# Patient Record
Sex: Male | Born: 1985 | Race: White | Hispanic: No | Marital: Single | Smoking: Never smoker
Health system: Southern US, Community
[De-identification: ages and names within clinical notes are randomized; demographics above are authoritative.]

## PROBLEM LIST (undated history)

## (undated) DIAGNOSIS — E119 Type 2 diabetes mellitus without complications: Secondary | ICD-10-CM

## (undated) DIAGNOSIS — K589 Irritable bowel syndrome without diarrhea: Secondary | ICD-10-CM

## (undated) DIAGNOSIS — I10 Essential (primary) hypertension: Secondary | ICD-10-CM

## (undated) DIAGNOSIS — M869 Osteomyelitis, unspecified: Secondary | ICD-10-CM

## (undated) DIAGNOSIS — K219 Gastro-esophageal reflux disease without esophagitis: Secondary | ICD-10-CM

## (undated) DIAGNOSIS — N189 Chronic kidney disease, unspecified: Secondary | ICD-10-CM

## (undated) HISTORY — DX: Type 2 diabetes mellitus without complications: E11.9

## (undated) HISTORY — DX: Gastro-esophageal reflux disease without esophagitis: K21.9

## (undated) HISTORY — DX: Irritable bowel syndrome, unspecified: K58.9

---
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
## 2007-01-09 ENCOUNTER — Ambulatory Visit: Payer: Self-pay

## 2007-03-20 ENCOUNTER — Emergency Department: Payer: Self-pay | Admitting: Emergency Medicine

## 2007-03-29 ENCOUNTER — Inpatient Hospital Stay (HOSPITAL_COMMUNITY): Admission: EM | Admit: 2007-03-29 | Discharge: 2007-03-31 | Payer: Self-pay | Admitting: Emergency Medicine

## 2007-04-06 LAB — CBC AND DIFFERENTIAL
HCT: 45 % (ref 41–53)
Hemoglobin: 15.9 g/dL (ref 13.5–17.5)
Neutrophils Absolute: 4 /uL
Platelets: 223 10*3/uL (ref 150–399)
WBC: 7.2 10*3/mL

## 2007-10-18 ENCOUNTER — Emergency Department: Payer: Self-pay | Admitting: Emergency Medicine

## 2007-11-07 ENCOUNTER — Inpatient Hospital Stay: Payer: Self-pay | Admitting: Internal Medicine

## 2007-11-24 ENCOUNTER — Inpatient Hospital Stay: Payer: Self-pay | Admitting: Internal Medicine

## 2008-02-07 LAB — BASIC METABOLIC PANEL
BUN: 14 mg/dL (ref 4–21)
CREATININE: 1.1 mg/dL (ref 0.6–1.3)
Glucose: 345 mg/dL
Potassium: 4.3 mmol/L (ref 3.4–5.3)
Sodium: 136 mmol/L — AB (ref 137–147)

## 2008-02-07 LAB — HEPATIC FUNCTION PANEL
ALK PHOS: 117 U/L (ref 25–125)
ALT: 23 U/L (ref 10–40)
AST: 18 U/L (ref 14–40)
Bilirubin, Total: 0.4 mg/dL

## 2008-02-07 LAB — LIPID PANEL
Cholesterol: 243 mg/dL — AB (ref 0–200)
HDL: 44 mg/dL (ref 35–70)
LDL Cholesterol: 147 mg/dL
Triglycerides: 259 mg/dL — AB (ref 40–160)

## 2008-04-09 ENCOUNTER — Observation Stay: Payer: Self-pay | Admitting: Specialist

## 2008-08-24 ENCOUNTER — Emergency Department: Payer: Self-pay | Admitting: Emergency Medicine

## 2008-09-13 ENCOUNTER — Emergency Department: Payer: Self-pay | Admitting: Emergency Medicine

## 2008-10-29 ENCOUNTER — Emergency Department: Payer: Self-pay | Admitting: Emergency Medicine

## 2009-10-30 ENCOUNTER — Emergency Department: Payer: Self-pay | Admitting: Emergency Medicine

## 2009-11-16 ENCOUNTER — Inpatient Hospital Stay: Payer: Self-pay | Admitting: *Deleted

## 2009-11-25 DIAGNOSIS — F329 Major depressive disorder, single episode, unspecified: Secondary | ICD-10-CM | POA: Insufficient documentation

## 2009-12-30 DIAGNOSIS — K219 Gastro-esophageal reflux disease without esophagitis: Secondary | ICD-10-CM | POA: Insufficient documentation

## 2010-03-26 ENCOUNTER — Emergency Department: Payer: Self-pay | Admitting: Emergency Medicine

## 2010-08-07 ENCOUNTER — Emergency Department: Payer: Self-pay | Admitting: Emergency Medicine

## 2010-11-17 NOTE — Discharge Summary (Signed)
NAMEJOSHAWN, SETIAWAN NO.:  [id number]   MEDICAL RECORD NO.:  [id number]          PATIENT TYPE:  INP   LOCATION:  4712                         FACILITY:  [facility]   PHYSICIAN:  Rexene Alberts, M.D.    DATE OF BIRTH:  02-07-1986   DATE OF ADMISSION:  03/29/2007  DATE OF DISCHARGE:  03/31/2007                               DISCHARGE SUMMARY   DISCHARGE DIAGNOSES:  1. Epigastric abdominal pain thought to be secondary to either peptic      ulcer disease or gastritis.  2. Nausea, vomiting with a small amount of hematemesis by history.  3. Uncontrolled type 2 diabetes mellitus.  4. Hypokalemia.  5. Tobacco abuse.   DISCHARGE MEDICATIONS:  1. Ranitidine 150 one pill b.i.d.  2. Vicodin 5 mg every four hours as needed for pain.  3. Potassium chloride 20 mEq daily for one week.  4. Lantus insulin 40 units subcu q.h.s.  5. 70/30 insulin 35 units b.i.d.   DISCHARGE DISPOSITION:  The patient was discharged to home in improved  and stable condition on March 31, 2007.  He was advised to follow up  with his primary care physician at the [facility] in one week.   PROCEDURES PERFORMED:  CT scan of the abdomen and pelvis with contrast  on March 30, 2007.  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  CT scan of the pelvis revealed no  acute pelvic findings.   HISTORY OF PRESENT ILLNESS:  The patient is a 25 year old man with a  past medical history significant for type 2 diabetes mellitus, chronic  low back pain, and migraine headaches.  He presented to the emergency  department on March 29, 2007 with a chief complaint of abdominal  pain, nausea, and vomiting.  On one occasion he witnessed a small amount  of red blood in his emesis.  He also had diarrhea for one day and  noticed one maroon colored stool.  When he was evaluated in the  emergency  department, he was noted to be hemodynamically stable and  afebrile.  His lab data were noted for a serum glucose 440 and an  amylase and lipase which were 64 and 16, respectively.  The patient was  admitted for further evaluation and management.   For additional details please see the dictated History and Physical.   HOSPITAL COURSE:  1. ABDOMINAL PAIN, NAUSEA, VOMITING, AND MILD HEMATEMESIS/BRIGHT RED      BLOOD PER RECTUM (BY HISTORY).  The patient was started on      maintenance IV fluids, intravenous Protonix 40 mg q.12 h., and      p.r.n. Dilaudid.  He was initially made n.p.o.  However, upon      request clear liquids were started.  For further evaluation, a CT      scan of the abdomen and pelvis was ordered to rule out any intra-      abdominal process.  The results were essentially negative for any      acute findings in the  abdomen and pelvis.  Over the first 24 hours      of the hospitalization, the patient had no complaints of vomiting      or bright red blood per rectum.  He did become nauseated once.  He      was treated with as needed Phenergan and Zofran.  The following day      his diet was advanced to a full liquid diet and then to a      carbohydrate modified diet.  The patient ate his meals without any      acute abdominal pain, nausea or vomiting.  The patient gives a      previous history of peptic ulcer disease, although he had never      undergone an endoscopic exam.  As indicated above he was treated      with intravenous Protonix.  His symptoms subsided and completely      resolved prior to hospital discharge on supportive care and proton      pump inhibitor therapy.  It is possible that the patient may have      peptic ulcer disease and/or had a bout of acute gastritis which      transiently caused some mild limiting GI bleeding.  The patient may      require further evaluation in the future with an upper and/or lower      endoscopy.   The patient's  hemoglobin remained stable during the hospitalization.  Prior to hospital discharge the patient was advised to take ranitidine  150 mg b.i.d..  The patient mentioned that he could not afford Prilosec,  although he had taken it in the past.  For this reason he was advised to  go to [facility] to fill the prescription for ranitidine.  The patient was  also advised to avoid NSAID products.  Vicodin was prescribed as needed  for chronic low back pain.  The patient was also advised to take Tylenol  if needed for discomfort.   1. UNCONTROLLED DIABETES MELLITUS.  The patient's venous glucose was      440 at the time of hospital admission.  He was treated with IV      fluids initially.  Within several hours his blood glucose improved      to the 200 range.  Initially the amount of insulin that he was      given was less than his home dose because of his recent nausea and      vomiting and marginal p.o. intake.  However, over the course of the      first 24 hours his blood glucose increased to the 300s.  Therefore      his insulin was titrated up accordingly.  He was also started on a      sliding scale insulin regimen for additional management.  His      hemoglobin A1c was assessed and found to be quite elevated at 12.1.      The registered dietician was consulted and provided management      recommendations which were followed.  The patient was advised to      follow up with the diabetes management center if and when he was      able to acquire insurance coverage.  Prior to hospital discharge      the patient was advised to increase the dose of his Lantus to 40      units subcu q.h.s. and to increase 70/30 insulin to 35 units  b.i.d.      The patient voiced understanding.  Of note a C-peptide level was      ordered and was within normal limits at 2.75 indicating that the      patient is probably a type 2 diabetic.   1. HYPOKALEMIA.  The patient's serum potassium at the time of hospital       admission was 3.9.  Over the course of the hospitalization, it fell      to 3.1 as his venous glucose improved.  The patient was treated      with potassium chloride 40 mEq today.  He was discharged to home on      20 mEq daily for seven more days.  The patient was advised to      follow up with his primary care physician to have his serum      potassium rechecked.   DISCHARGE LABORATORY DATA:  TSH 1.527.      Rexene Alberts, M.D.  Electronically Signed     DF/MEDQ  D:  03/31/2007  T:  04/01/2007  Job:  [id number]

## 2010-11-17 NOTE — H&P (Signed)
NAMEJOBANY, WATROUS NO.:  [id number]   MEDICAL RECORD NO.:  [id number]          PATIENT TYPE:  INP   LOCATION:  4712                         FACILITY:  [facility]   PHYSICIAN:  Rexene Alberts, M.D.    DATE OF BIRTH:  [birth year]   DATE OF ADMISSION:  03/29/2007  DATE OF DISCHARGE:                              HISTORY & PHYSICAL   PRIMARY CARE PHYSICIAN:  The patient is unassigned. (His primary care  physician is located at the [facility] in [place], [place]).   CHIEF COMPLAINT:  Abdominal pain, nausea and vomiting.   HISTORY OF PRESENT ILLNESS:  The patient is a 25 year old man with a  past medical history significant for diabetes mellitus, who presents to  the emergency department with a chief complaint of abdominal pain,  nausea, and vomiting.  His symptoms started 2 days ago.  The abdominal  pain is located primarily in the epigastrium and the right upper  quadrant.  It has been mild to moderate in intensity, for the first 24  hours.  However, over the past 12 to 24 hours, the pain has been  moderate to severe.  He had one episode of nausea and vomiting yesterday  and 4 episodes of nausea and vomiting today.  He did see a small amount  of red blood in his emesis.  He denies coffee grounds emesis.  He had  three loose stools yesterday which were brown in color; however, one  stool did have a small amount of maroon-colored blood.  He denies any  associated fever, chills, pain with urination or dizziness.  He denies  alcohol and NSAID use.  He says that he was diagnosed with stomach  ulcers 2 years ago; however, he did not undergo an endoscopy.  The the  patient says that his blood glucose at home has been ranging from 130 to  200 until today when it was 400.Marland Kitchen  He says that he did not miss any  insulin doses yesterday or today.   During the evaluation in the emergency department, the patient is noted  to be hemodynamically stable.  He is  afebrile.  His lab data are a  significant for a venous glucose of 443, bicarbonate of 25, and lipase  of 16.  The patient will be admitted for further evaluation and  management.   PAST MEDICAL HISTORY:  1. Diabetes mellitus.  Diagnosed 3 years ago.  The patient is      uncertain about whether he has type 1 or type 2 diabetes mellitus.      He says that when he was diagnosed initially, he was started on      oral medications.  The patient has never been in DKA by his      account.  2. History of hypertension.  The patient stopped taking      antihypertensive medications approximately 1 year ago, and he says      since that time, his blood pressure has been normal.  3. History of a mini-stroke at 25 years old.  No deficits.  4. Migraine headaches.  5. Chronic low back pain and bilateral knee pain, secondary to a fall      in the past.   MEDICATIONS:  1. Novolin insulin 70/30 30 units in the morning and 30 units at 4:00      p.m..  2. Lantus 32 units q.h.s.  3. Prilosec 20 mg, 1 to 5 pills daily.  4. Ambien 10 mg q.h.s. p.r.n.   ALLERGIES:  THE PATIENT HAS ALLERGIES TO SULFA DRUGS AND KEFLEX.   SOCIAL HISTORY:  The patient is single.  He lives in [place], [place].  He has no children, but he is expecting one soon.  He smokes  a half-a-pack of cigarettes per day and one cigar per day.  He denies  illicit drug use and alcohol use.  He is employed as a Doctor, general practice.   FAMILY HISTORY:  His father is 51 years of age and has no significant  past medical history.  His mother is 76 years of age and she has  diabetes mellitus.   REVIEW OF SYSTEMS:  The patient's review of systems is positive for  chronic low back pain, knee pain, and occasional epigastric pain.   EXAM:  VITAL SIGNS:  Temperature 97.3, blood pressure 136/90, pulse 95,  respiratory rate 14, oxygen saturation 95% on room air.  GENERAL:  The  patient is a pleasant, 25 year old Caucasian man who is currently  sitting up  in bed, in no acute distress.  HEENT:  Head is normocephalic, nontraumatic.  Pupils equal, round,  reactive to light.  Extraocular movements are intact.  Conjunctivae are  clear.  Sclerae are white.  Tympanic membranes are clear bilaterally.  Nasal mucosa is dry.  No sinus tenderness.  Oropharynx reveals good  dentition.  Mucous membranes are mildly dry.  No posterior exudates or  erythema.  NECK:  Supple, no adenopathy, no thyromegaly, no bruit, no JVD.  LUNGS: Clear to auscultation bilaterally.  HEART:  S1, S2 with a soft systolic murmur.  ABDOMEN:  Hypoactive bowel sounds, soft, mildly to moderately tender in  the epigastrium and right upper quadrant.  Mildly tender in the right  and left lower quadrants.  No rebound, no guarding and no appreciable  distention.  GU:  Rectal deferred.  EXTREMITIES:  Pedal pulses 2+ bilaterally.  No pre-tibial edema and no  pedal edema.  NEUROLOGIC:  The patient is alert and oriented x3.  Cranial nerves II-  XII are intact.  Strength is 5/5 throughout.  Sensation is intact.   ADMISSION LABORATORIES:  Sodium 136, potassium 3.8, chloride 105, CO2  25, glucose 440, BUN 8, creatinine 0.9, calcium 9.0, total protein 6.4,  albumin 3.6, AST 22, ALT 32.  Urinalysis greater than 1,000 glucose,  negative leukocyte esterase, 0-2 WBCs and 0-2 RBCs.  Stool guaiac  negative.  Amylase 64 and lipase 16.   ASSESSMENT:  1. Abdominal pain, nausea and vomiting.  The etiology is unclear at      this time.  However, will consider an acute bout of      gastroenteritis, exacerbation of gastritis or peptic ulcer disease,      or possibly acute cholecystitis.  A CT scan of the abdomen and      pelvis will be ordered to assess for other intra-abdominal      abnormalities.  2. Mild hematemesis per history.  3. Possible peptic ulcer disease.  The diagnosis of peptic ulcer      disease was a clinical one and not based on results  of an      endoscopy.  4. Uncontrolled  diabetes mellitus.  The patient's venous glucose on      admission is 440.  The patient denies noncompliance.  However, more      than likely, the patient has not been totally compliant with      insulin therapy over the past few days, given his GI symptoms.  Of      note, the patient is not in DKA.   PLAN:  1. The patient will be admitted for further evaluation and management.  2. He received at least 1 liter of normal saline in the emergency      department.  Will continue volume repletion with normal saline,      with potassium chloride added.  3. Supportive care, pain management, and antiemetic therapy.  4. IV Protonix q.12 h.  5. Will start a clear liquid diet only and advance as tolerated.  6. For further evaluation will check a CT scan of the abdomen and      pelvis.  We will also assess the patient's CBC for leukocytosis.  7. Will check a hemoglobin A1c, TSH, and C-peptide.  8. Tobacco cessation counseling.      Rexene Alberts, M.D.  Electronically Signed     DF/MEDQ  D:  03/29/2007  T:  03/30/2007  Job:  [id number]

## 2011-04-15 LAB — DIFFERENTIAL
Eosinophils Absolute: 0.1
Eosinophils Relative: 1
Lymphocytes Relative: 34
Lymphs Abs: 2
Monocytes Absolute: 0.6
Monocytes Relative: 11
Neutro Abs: 3.2

## 2011-04-15 LAB — BASIC METABOLIC PANEL
CO2: 28
Calcium: 9.2
GFR calc Af Amer: >60
Glucose, Bld: 193 — ABNORMAL HIGH
Potassium: 3.1 — ABNORMAL LOW
Sodium: 141

## 2011-04-15 LAB — HEMOGLOBIN A1C: Hgb A1c MFr Bld: 12.1 — ABNORMAL HIGH

## 2011-04-15 LAB — COMPREHENSIVE METABOLIC PANEL
ALT: 31
AST: 22
AST: 22
Albumin: 3.2 — ABNORMAL LOW
Albumin: 3.5
Albumin: 3.6
Alkaline Phosphatase: 79
Alkaline Phosphatase: 88
BUN: 10
BUN: 8
BUN: 9
CO2: 25
Calcium: 9
Chloride: 102
Chloride: 104
Chloride: 105
Creatinine, Ser: 0.83
Creatinine, Ser: 0.9
Creatinine, Ser: 0.98
GFR calc Af Amer: >60
GFR calc Af Amer: >60
GFR calc non Af Amer: >60
Glucose, Bld: 256 — ABNORMAL HIGH
Potassium: 3.7
Potassium: 4.1
Sodium: 138
Total Bilirubin: 0.7
Total Bilirubin: 0.7
Total Bilirubin: 0.8
Total Protein: 5.5 — ABNORMAL LOW
Total Protein: 6.4

## 2011-04-15 LAB — CBC
HCT: 45
Hemoglobin: 15.4
MCV: 82.7
Platelets: 203
RBC: 5.02
RBC: 5.44
RDW: 12.2
RDW: 12.3
WBC: 5.9
WBC: 7.1

## 2011-04-15 LAB — URINALYSIS, ROUTINE W REFLEX MICROSCOPIC
Glucose, UA: >1000 — AB
Hgb urine dipstick: NEGATIVE
Ketones, ur: NEGATIVE
Protein, ur: NEGATIVE
Urobilinogen, UA: 0.2
pH: 6.5

## 2011-04-15 LAB — URINE MICROSCOPIC-ADD ON

## 2011-04-15 LAB — I-STAT 8, (EC8 V) (CONVERTED LAB)
BUN: 8
Bicarbonate: 23.5
Glucose, Bld: 443 — ABNORMAL HIGH
HCT: 46
Hemoglobin: 15.6
Operator id: 294521
Potassium: 3.9
Sodium: 135
TCO2: 25

## 2011-04-15 LAB — HEMOGLOBIN AND HEMATOCRIT, BLOOD
HCT: 42.7
Hemoglobin: 14.8

## 2011-04-15 LAB — POCT I-STAT CREATININE: Operator id: 294521

## 2011-04-15 LAB — OCCULT BLOOD X 1 CARD TO LAB, STOOL: Fecal Occult Bld: NEGATIVE

## 2011-04-15 LAB — TSH: TSH: 1.527

## 2012-09-21 ENCOUNTER — Emergency Department: Payer: Self-pay | Admitting: Emergency Medicine

## 2012-09-21 LAB — URINALYSIS, COMPLETE
Leukocyte Esterase: NEGATIVE
Nitrite: NEGATIVE
Protein: 100
RBC,UR: 2 /HPF (ref 0–5)
Squamous Epithelial: <1
WBC UR: 3 /HPF (ref 0–5)

## 2012-09-21 LAB — COMPREHENSIVE METABOLIC PANEL
Albumin: 4.4 g/dL (ref 3.4–5.0)
Alkaline Phosphatase: 92 U/L (ref 50–136)
Anion Gap: 8 (ref 7–16)
Bilirubin,Total: 0.7 mg/dL (ref 0.2–1.0)
Calcium, Total: 9.8 mg/dL (ref 8.5–10.1)
Creatinine: 0.76 mg/dL (ref 0.60–1.30)
EGFR (Non-African Amer.): >60
Potassium: 4.3 mmol/L (ref 3.5–5.1)
SGOT(AST): 30 U/L (ref 15–37)
SGPT (ALT): 55 U/L (ref 12–78)

## 2012-09-21 LAB — CBC
MCV: 83 fL (ref 80–100)
Platelet: 228 10*3/uL (ref 150–440)
WBC: 8.8 10*3/uL (ref 3.8–10.6)

## 2012-09-21 LAB — LIPASE, BLOOD: Lipase: 78 U/L (ref 73–393)

## 2012-09-22 ENCOUNTER — Emergency Department: Payer: Self-pay | Admitting: Emergency Medicine

## 2012-09-22 LAB — CBC
HCT: 44.5 % (ref 40.0–52.0)
HGB: 15.5 g/dL (ref 13.0–18.0)
MCH: 29 pg (ref 26.0–34.0)
MCHC: 34.9 g/dL (ref 32.0–36.0)
MCV: 83 fL (ref 80–100)
Platelet: 231 10*3/uL (ref 150–440)
RBC: 5.35 10*6/uL (ref 4.40–5.90)

## 2012-09-22 LAB — COMPREHENSIVE METABOLIC PANEL
Alkaline Phosphatase: 90 U/L (ref 50–136)
Anion Gap: 3 — ABNORMAL LOW (ref 7–16)
BUN: 16 mg/dL (ref 7–18)
Co2: 30 mmol/L (ref 21–32)
Creatinine: 0.82 mg/dL (ref 0.60–1.30)
EGFR (African American): >60
EGFR (Non-African Amer.): >60
Glucose: 74 mg/dL (ref 65–99)
Osmolality: 272 (ref 275–301)
Potassium: 3.7 mmol/L (ref 3.5–5.1)

## 2012-09-22 LAB — URINALYSIS, COMPLETE
Bilirubin,UR: NEGATIVE
Nitrite: NEGATIVE
Protein: 30
RBC,UR: 2 /HPF (ref 0–5)
Specific Gravity: 1.03 (ref 1.003–1.030)
Squamous Epithelial: NONE SEEN

## 2012-09-22 LAB — LIPASE, BLOOD: Lipase: 75 U/L (ref 73–393)

## 2012-10-03 ENCOUNTER — Emergency Department: Payer: Self-pay | Admitting: Emergency Medicine

## 2012-10-03 LAB — URINALYSIS, COMPLETE
Bilirubin,UR: NEGATIVE
Blood: NEGATIVE
Ketone: NEGATIVE
Nitrite: NEGATIVE
Protein: 30
RBC,UR: 1 /HPF (ref 0–5)
Specific Gravity: 1.021 (ref 1.003–1.030)
WBC UR: 11 /HPF (ref 0–5)

## 2012-10-03 LAB — COMPREHENSIVE METABOLIC PANEL
Albumin: 4.1 g/dL (ref 3.4–5.0)
Alkaline Phosphatase: 85 U/L (ref 50–136)
Anion Gap: 6 — ABNORMAL LOW (ref 7–16)
BUN: 11 mg/dL (ref 7–18)
Bilirubin,Total: 0.3 mg/dL (ref 0.2–1.0)
Calcium, Total: 9 mg/dL (ref 8.5–10.1)
Chloride: 105 mmol/L (ref 98–107)
Co2: 28 mmol/L (ref 21–32)
Creatinine: 0.63 mg/dL (ref 0.60–1.30)
EGFR (African American): >60
EGFR (Non-African Amer.): >60
Osmolality: 281 (ref 275–301)
Total Protein: 7.7 g/dL (ref 6.4–8.2)

## 2012-10-03 LAB — CBC
HCT: 44.2 % (ref 40.0–52.0)
HGB: 15.4 g/dL (ref 13.0–18.0)
MCH: 29.2 pg (ref 26.0–34.0)
RBC: 5.25 10*6/uL (ref 4.40–5.90)
RDW: 12.5 % (ref 11.5–14.5)
WBC: 8.7 10*3/uL (ref 3.8–10.6)

## 2012-10-06 LAB — STOOL CULTURE

## 2012-12-28 ENCOUNTER — Emergency Department: Payer: Self-pay | Admitting: Emergency Medicine

## 2012-12-28 LAB — COMPREHENSIVE METABOLIC PANEL
Albumin: 4.1 g/dL (ref 3.4–5.0)
Bilirubin,Total: 0.5 mg/dL (ref 0.2–1.0)
Calcium, Total: 8.9 mg/dL (ref 8.5–10.1)
Chloride: 107 mmol/L (ref 98–107)
Co2: 29 mmol/L (ref 21–32)
EGFR (African American): >60
EGFR (Non-African Amer.): >60
Osmolality: 285 (ref 275–301)
Potassium: 4.3 mmol/L (ref 3.5–5.1)
SGPT (ALT): 20 U/L (ref 12–78)
Total Protein: 7.4 g/dL (ref 6.4–8.2)

## 2012-12-28 LAB — URINALYSIS, COMPLETE
Bacteria: NONE SEEN
Blood: NEGATIVE
Ketone: NEGATIVE
Leukocyte Esterase: NEGATIVE
Protein: NEGATIVE
Specific Gravity: 1.006 (ref 1.003–1.030)
Squamous Epithelial: NONE SEEN
WBC UR: 1 /HPF (ref 0–5)

## 2012-12-28 LAB — BETA-HYDROXYBUTYRIC ACID: Beta-Hydroxybutyrate: 0.88 mg/dL (ref 0.2–2.8)

## 2012-12-28 LAB — CBC
HCT: 38.8 % — ABNORMAL LOW (ref 40.0–52.0)
MCHC: 35.1 g/dL (ref 32.0–36.0)
Platelet: 183 10*3/uL (ref 150–440)
RBC: 4.7 10*6/uL (ref 4.40–5.90)

## 2013-06-01 DIAGNOSIS — E860 Dehydration: Secondary | ICD-10-CM | POA: Insufficient documentation

## 2013-06-01 DIAGNOSIS — R197 Diarrhea, unspecified: Secondary | ICD-10-CM | POA: Insufficient documentation

## 2014-08-30 ENCOUNTER — Emergency Department: Payer: Self-pay | Admitting: Emergency Medicine

## 2014-09-12 ENCOUNTER — Emergency Department: Payer: Self-pay | Admitting: Emergency Medicine

## 2014-09-24 ENCOUNTER — Emergency Department: Payer: Self-pay | Admitting: Emergency Medicine

## 2014-10-06 ENCOUNTER — Emergency Department
Admit: 2014-10-06 | Disposition: A | Discharge status: Home / Self Care | Payer: Self-pay | Admitting: Emergency Medicine

## 2014-10-06 LAB — URINALYSIS, COMPLETE
Bacteria: NONE SEEN
Bilirubin,UR: NEGATIVE
Glucose,UR: >=500 mg/dL (ref 0–75)
LEUKOCYTE ESTERASE: NEGATIVE
NITRITE: NEGATIVE
PH: 6 (ref 4.5–8.0)
PROTEIN: NEGATIVE
RBC,UR: 2 /HPF (ref 0–5)
Specific Gravity: 1.031 (ref 1.003–1.030)
Squamous Epithelial: NONE SEEN
WBC UR: <1 /HPF (ref 0–5)

## 2014-10-06 LAB — COMPREHENSIVE METABOLIC PANEL
ALK PHOS: 95 U/L
ALT: 24 U/L
AST: 30 U/L
Albumin: 5.3 g/dL — ABNORMAL HIGH
Anion Gap: 15 (ref 7–16)
BUN: 16 mg/dL
Bilirubin,Total: 1.3 mg/dL — ABNORMAL HIGH
Calcium, Total: 10 mg/dL
Chloride: 91 mmol/L — ABNORMAL LOW
Co2: 26 mmol/L
Creatinine: 1.04 mg/dL
EGFR (African American): >60
GLUCOSE: 634 mg/dL — AB
Potassium: 3.4 mmol/L — ABNORMAL LOW
Sodium: 132 mmol/L — ABNORMAL LOW
Total Protein: 8.9 g/dL — ABNORMAL HIGH

## 2014-10-06 LAB — CBC WITH DIFFERENTIAL/PLATELET
BASOS PCT: 0.4 %
Basophil #: 0 10*3/uL (ref 0.0–0.1)
Eosinophil #: 0 10*3/uL (ref 0.0–0.7)
Eosinophil %: 0.2 %
HCT: 47.4 % (ref 40.0–52.0)
HGB: 16.4 g/dL (ref 13.0–18.0)
Lymphocyte #: 2 10*3/uL (ref 1.0–3.6)
Lymphocyte %: 22.5 %
MCH: 29.2 pg (ref 26.0–34.0)
MCHC: 34.7 g/dL (ref 32.0–36.0)
MCV: 84 fL (ref 80–100)
Monocyte #: 0.8 x10 3/mm (ref 0.2–1.0)
Monocyte %: 9.7 %
Neutrophil #: 5.9 10*3/uL (ref 1.4–6.5)
Neutrophil %: 67.2 %
Platelet: 250 10*3/uL (ref 150–440)
RBC: 5.63 10*6/uL (ref 4.40–5.90)
RDW: 12.7 % (ref 11.5–14.5)
WBC: 8.7 10*3/uL (ref 3.8–10.6)

## 2014-10-06 LAB — LIPASE, BLOOD: Lipase: 23 U/L

## 2014-10-06 LAB — TROPONIN I

## 2016-03-01 DIAGNOSIS — K219 Gastro-esophageal reflux disease without esophagitis: Secondary | ICD-10-CM

## 2016-03-01 DIAGNOSIS — E119 Type 2 diabetes mellitus without complications: Secondary | ICD-10-CM

## 2016-03-01 DIAGNOSIS — E7800 Pure hypercholesterolemia, unspecified: Secondary | ICD-10-CM

## 2016-03-01 DIAGNOSIS — Z794 Long term (current) use of insulin: Secondary | ICD-10-CM

## 2016-03-01 DIAGNOSIS — F339 Major depressive disorder, recurrent, unspecified: Secondary | ICD-10-CM

## 2017-07-13 ENCOUNTER — Encounter: Payer: Self-pay | Admitting: Anesthesiology

## 2017-07-13 ENCOUNTER — Inpatient Hospital Stay
Admission: EM | Admit: 2017-07-13 | Discharge: 2017-07-15 | DRG: 391 | Disposition: A | Discharge status: Home / Self Care | Payer: Self-pay | Attending: Internal Medicine | Admitting: Internal Medicine

## 2017-07-13 ENCOUNTER — Emergency Department: Payer: Self-pay

## 2017-07-13 ENCOUNTER — Encounter: Payer: Self-pay | Admitting: Emergency Medicine

## 2017-07-13 ENCOUNTER — Other Ambulatory Visit: Payer: Self-pay

## 2017-07-13 ENCOUNTER — Encounter
Admission: EM | Disposition: A | Discharge status: Home / Self Care | Payer: Self-pay | Source: Home / Self Care | Attending: Internal Medicine

## 2017-07-13 DIAGNOSIS — R109 Unspecified abdominal pain: Secondary | ICD-10-CM | POA: Diagnosis present

## 2017-07-13 DIAGNOSIS — R1084 Generalized abdominal pain: Secondary | ICD-10-CM

## 2017-07-13 DIAGNOSIS — K219 Gastro-esophageal reflux disease without esophagitis: Secondary | ICD-10-CM | POA: Diagnosis present

## 2017-07-13 DIAGNOSIS — R112 Nausea with vomiting, unspecified: Secondary | ICD-10-CM

## 2017-07-13 DIAGNOSIS — R739 Hyperglycemia, unspecified: Secondary | ICD-10-CM

## 2017-07-13 DIAGNOSIS — E43 Unspecified severe protein-calorie malnutrition: Secondary | ICD-10-CM | POA: Diagnosis present

## 2017-07-13 DIAGNOSIS — E1065 Type 1 diabetes mellitus with hyperglycemia: Secondary | ICD-10-CM | POA: Diagnosis present

## 2017-07-13 DIAGNOSIS — K529 Noninfective gastroenteritis and colitis, unspecified: Principal | ICD-10-CM | POA: Diagnosis present

## 2017-07-13 DIAGNOSIS — E1042 Type 1 diabetes mellitus with diabetic polyneuropathy: Secondary | ICD-10-CM

## 2017-07-13 DIAGNOSIS — Z681 Body mass index (BMI) 19 or less, adult: Secondary | ICD-10-CM

## 2017-07-13 LAB — URINALYSIS, COMPLETE (UACMP) WITH MICROSCOPIC
Bacteria, UA: NONE SEEN
Bilirubin Urine: NEGATIVE
KETONES UR: 5 mg/dL — AB
LEUKOCYTES UA: NEGATIVE
Nitrite: NEGATIVE
Protein, ur: 100 mg/dL — AB
Specific Gravity, Urine: 1.025 (ref 1.005–1.030)
Squamous Epithelial / LPF: NONE SEEN
pH: 6 (ref 5.0–8.0)

## 2017-07-13 LAB — BLOOD GAS, VENOUS
Acid-Base Excess: 7.1 mmol/L — ABNORMAL HIGH (ref 0.0–2.0)
BICARBONATE: 33.1 mmol/L — AB (ref 20.0–28.0)
O2 Saturation: 82.6 %
PCO2 VEN: 51 mmHg (ref 44.0–60.0)
PH VEN: 7.42 (ref 7.250–7.430)
Patient temperature: 37
pO2, Ven: 46 mmHg — ABNORMAL HIGH (ref 32.0–45.0)

## 2017-07-13 LAB — BASIC METABOLIC PANEL
ANION GAP: 8 (ref 5–15)
Anion gap: 12 (ref 5–15)
BUN: 20 mg/dL (ref 6–20)
BUN: 17 mg/dL (ref 6–20)
CALCIUM: 8.1 mg/dL — AB (ref 8.9–10.3)
CHLORIDE: 85 mmol/L — AB (ref 101–111)
CO2: 30 mmol/L (ref 22–32)
CO2: 27 mmol/L (ref 22–32)
CREATININE: 1.6 mg/dL — AB (ref 0.61–1.24)
Calcium: 9.1 mg/dL (ref 8.9–10.3)
Chloride: 96 mmol/L — ABNORMAL LOW (ref 101–111)
Creatinine, Ser: 1.22 mg/dL (ref 0.61–1.24)
GFR calc Af Amer: >60 mL/min (ref >60)
GFR calc non Af Amer: 57 mL/min — ABNORMAL LOW (ref >60)
Glucose, Bld: 671 mg/dL (ref 65–99)
Glucose, Bld: 394 mg/dL — ABNORMAL HIGH (ref 65–99)
Potassium: 4 mmol/L (ref 3.5–5.1)
Potassium: 3.5 mmol/L (ref 3.5–5.1)
SODIUM: 131 mmol/L — AB (ref 135–145)
Sodium: 127 mmol/L — ABNORMAL LOW (ref 135–145)

## 2017-07-13 LAB — CBC
HCT: 34.4 % — ABNORMAL LOW (ref 40.0–52.0)
Hemoglobin: 11.8 g/dL — ABNORMAL LOW (ref 13.0–18.0)
MCH: 28.9 pg (ref 26.0–34.0)
MCHC: 34.2 g/dL (ref 32.0–36.0)
MCV: 84.6 fL (ref 80.0–100.0)
Platelets: 259 10*3/uL (ref 150–440)
RBC: 4.06 MIL/uL — ABNORMAL LOW (ref 4.40–5.90)
RDW: 12.8 % (ref 11.5–14.5)
WBC: 9.1 10*3/uL (ref 3.8–10.6)

## 2017-07-13 LAB — GLUCOSE, CAPILLARY
GLUCOSE-CAPILLARY: 434 mg/dL — AB (ref 65–99)
Glucose-Capillary: 582 mg/dL (ref 65–99)
Glucose-Capillary: 372 mg/dL — ABNORMAL HIGH (ref 65–99)
Glucose-Capillary: 326 mg/dL — ABNORMAL HIGH (ref 65–99)
Glucose-Capillary: 280 mg/dL — ABNORMAL HIGH (ref 65–99)

## 2017-07-13 LAB — TROPONIN I

## 2017-07-13 SURGERY — LAPAROTOMY, EXPLORATORY
Anesthesia: General

## 2017-07-13 MED ORDER — SODIUM CHLORIDE 0.9 % IV BOLUS (SEPSIS)
1000.0000 mL | Freq: Once | INTRAVENOUS | Status: AC
  Administered 2017-07-13: 1000 mL via INTRAVENOUS

## 2017-07-13 MED ORDER — PANTOPRAZOLE SODIUM 40 MG IV SOLR
40.0000 mg | Freq: Two times a day (BID) | INTRAVENOUS | Status: DC
  Administered 2017-07-13 – 2017-07-14 (×2): 40 mg via INTRAVENOUS
  Filled 2017-07-13 (×2): qty 40

## 2017-07-13 MED ORDER — ONDANSETRON HCL 4 MG/2ML IJ SOLN
INTRAMUSCULAR | Status: AC
  Administered 2017-07-13: 4 mg via INTRAVENOUS
  Filled 2017-07-13: qty 2

## 2017-07-13 MED ORDER — INSULIN ASPART 100 UNIT/ML ~~LOC~~ SOLN
0.0000 [IU] | Freq: Four times a day (QID) | SUBCUTANEOUS | Status: DC
  Administered 2017-07-13: 11 [IU] via SUBCUTANEOUS
  Administered 2017-07-14: 4 [IU] via SUBCUTANEOUS
  Administered 2017-07-14 – 2017-07-15 (×2): 7 [IU] via SUBCUTANEOUS
  Filled 2017-07-13 (×4): qty 1

## 2017-07-13 MED ORDER — MORPHINE SULFATE (PF) 4 MG/ML IV SOLN
4.0000 mg | Freq: Once | INTRAVENOUS | Status: AC
  Administered 2017-07-13: 4 mg via INTRAVENOUS
  Filled 2017-07-13: qty 1

## 2017-07-13 MED ORDER — ONDANSETRON HCL 4 MG/2ML IJ SOLN
4.0000 mg | Freq: Four times a day (QID) | INTRAMUSCULAR | Status: DC | PRN
  Administered 2017-07-14 – 2017-07-15 (×2): 4 mg via INTRAVENOUS
  Filled 2017-07-13 (×2): qty 2

## 2017-07-13 MED ORDER — HYDRALAZINE HCL 20 MG/ML IJ SOLN: 10.0000 mg | INTRAMUSCULAR | Status: DC | PRN

## 2017-07-13 MED ORDER — LACTATED RINGERS IV SOLN
INTRAVENOUS | Status: DC
  Administered 2017-07-13 – 2017-07-15 (×4): via INTRAVENOUS

## 2017-07-13 MED ORDER — ENOXAPARIN SODIUM 40 MG/0.4ML ~~LOC~~ SOLN
40.0000 mg | SUBCUTANEOUS | Status: DC
  Administered 2017-07-13 – 2017-07-14 (×2): 40 mg via SUBCUTANEOUS
  Filled 2017-07-13 (×2): qty 0.4

## 2017-07-13 MED ORDER — DIPHENHYDRAMINE HCL 50 MG/ML IJ SOLN: 12.5000 mg | Freq: Four times a day (QID) | INTRAMUSCULAR | Status: DC | PRN

## 2017-07-13 MED ORDER — MORPHINE SULFATE (PF) 2 MG/ML IV SOLN
2.0000 mg | INTRAVENOUS | Status: DC | PRN
  Administered 2017-07-14 (×3): 2 mg via INTRAVENOUS
  Filled 2017-07-13 (×3): qty 1

## 2017-07-13 MED ORDER — KETOROLAC TROMETHAMINE 30 MG/ML IJ SOLN: 15.0000 mg | Freq: Four times a day (QID) | INTRAMUSCULAR | Status: DC | PRN

## 2017-07-13 MED ORDER — INSULIN ASPART 100 UNIT/ML ~~LOC~~ SOLN
10.0000 [IU] | Freq: Once | SUBCUTANEOUS | Status: AC
  Administered 2017-07-13: 10 [IU] via INTRAVENOUS
  Filled 2017-07-13: qty 1

## 2017-07-13 MED ORDER — INSULIN ASPART 100 UNIT/ML ~~LOC~~ SOLN: 0.0000 [IU] | Freq: Every day | SUBCUTANEOUS | Status: DC

## 2017-07-13 MED ORDER — DIPHENHYDRAMINE HCL 12.5 MG/5ML PO ELIX
12.5000 mg | ORAL_SOLUTION | Freq: Four times a day (QID) | ORAL | Status: DC | PRN
  Filled 2017-07-13: qty 5

## 2017-07-13 MED ORDER — ONDANSETRON HCL 4 MG/2ML IJ SOLN
4.0000 mg | Freq: Once | INTRAMUSCULAR | Status: AC
  Administered 2017-07-13: 4 mg via INTRAVENOUS
  Filled 2017-07-13: qty 2

## 2017-07-13 MED ORDER — ONDANSETRON HCL 4 MG/2ML IJ SOLN
4.0000 mg | Freq: Once | INTRAMUSCULAR | Status: AC
Start: 2017-07-13 — End: 2017-07-13
  Administered 2017-07-13: 4 mg via INTRAVENOUS

## 2017-07-13 MED ORDER — INSULIN ASPART 100 UNIT/ML ~~LOC~~ SOLN: 0.0000 [IU] | Freq: Three times a day (TID) | SUBCUTANEOUS | Status: DC

## 2017-07-13 MED ORDER — ONDANSETRON 4 MG PO TBDP: 4.0000 mg | ORAL_TABLET | Freq: Four times a day (QID) | ORAL | Status: DC | PRN

## 2017-07-13 MED ORDER — IOPAMIDOL (ISOVUE-300) INJECTION 61%
75.0000 mL | Freq: Once | INTRAVENOUS | Status: AC | PRN
  Administered 2017-07-13: 75 mL via INTRAVENOUS

## 2017-07-13 MED ORDER — PROMETHAZINE HCL 25 MG/ML IJ SOLN
12.5000 mg | Freq: Once | INTRAMUSCULAR | Status: AC
  Administered 2017-07-13: 12.5 mg via INTRAVENOUS
  Filled 2017-07-13: qty 1

## 2017-07-13 SURGICAL SUPPLY — 44 items
APPLIER CLIP 11 MED OPEN (CLIP) ×3
APPLIER CLIP 13 LRG OPEN (CLIP) ×3
BLADE CLIPPER SURG (BLADE) ×3 IMPLANT
BLADE SURG 15 STRL LF DISP TIS (BLADE) ×1 IMPLANT
BLADE SURG 15 STRL SS (BLADE) ×2
CANISTER SUCT 3000ML PPV (MISCELLANEOUS) ×3 IMPLANT
CHLORAPREP W/TINT 26ML (MISCELLANEOUS) ×3 IMPLANT
CLIP APPLIE 11 MED OPEN (CLIP) ×1 IMPLANT
CLIP APPLIE 13 LRG OPEN (CLIP) ×1 IMPLANT
DRAPE LAPAROTOMY 100X77 ABD (DRAPES) ×3 IMPLANT
DRAPE TABLE BACK 80X90 (DRAPES) ×3 IMPLANT
DRSG TEGADERM 2-3/8X2-3/4 SM (GAUZE/BANDAGES/DRESSINGS) ×6 IMPLANT
DRSG TELFA 3X8 NADH (GAUZE/BANDAGES/DRESSINGS) ×3 IMPLANT
ELECT BLADE 6.5 EXT (BLADE) ×3 IMPLANT
ELECT REM PT RETURN 9FT ADLT (ELECTROSURGICAL) ×3
ELECTRODE REM PT RTRN 9FT ADLT (ELECTROSURGICAL) ×1 IMPLANT
GAUZE SPONGE 4X4 12PLY STRL (GAUZE/BANDAGES/DRESSINGS) ×6 IMPLANT
GLOVE BIO SURGEON STRL SZ7 (GLOVE) ×3 IMPLANT
GOWN STRL REUS W/ TWL LRG LVL3 (GOWN DISPOSABLE) ×2 IMPLANT
GOWN STRL REUS W/TWL LRG LVL3 (GOWN DISPOSABLE) ×4
HANDLE SUCTION POOLE (INSTRUMENTS) ×1 IMPLANT
HANDLE YANKAUER SUCT BULB TIP (MISCELLANEOUS) ×3 IMPLANT
LIGASURE IMPACT 36 18CM CVD LR (INSTRUMENTS) IMPLANT
NEEDLE HYPO 22GX1.5 SAFETY (NEEDLE) ×6 IMPLANT
NEEDLE HYPO 25X1 1.5 SAFETY (NEEDLE) ×3 IMPLANT
PACK BASIN MAJOR ARMC (MISCELLANEOUS) ×3 IMPLANT
RELOAD PROXIMATE 75MM BLUE (ENDOMECHANICALS) IMPLANT
SPONGE LAP 18X18 5 PK (GAUZE/BANDAGES/DRESSINGS) ×3 IMPLANT
SPONGE LAP 18X36 2PK (MISCELLANEOUS) IMPLANT
STAPLER PROXIMATE 75MM BLUE (STAPLE) IMPLANT
STAPLER SKIN PROX 35W (STAPLE) ×3 IMPLANT
SUCTION POOLE HANDLE (INSTRUMENTS) ×3
SUT PDS AB 1 TP1 96 (SUTURE) ×6 IMPLANT
SUT SILK 2 0 (SUTURE) ×2
SUT SILK 2 0 SH CR/8 (SUTURE) ×3 IMPLANT
SUT SILK 2 0SH CR/8 30 (SUTURE) ×3 IMPLANT
SUT SILK 2-0 18XBRD TIE 12 (SUTURE) ×1 IMPLANT
SUT VIC AB 0 CT1 36 (SUTURE) ×6 IMPLANT
SUT VIC AB 2-0 SH 27 (SUTURE) ×4
SUT VIC AB 2-0 SH 27XBRD (SUTURE) ×2 IMPLANT
SYR 30ML LL (SYRINGE) ×6 IMPLANT
SYR 3ML LL SCALE MARK (SYRINGE) ×3 IMPLANT
TAPE MICROFOAM 4IN (TAPE) ×3 IMPLANT
TRAY FOLEY W/METER SILVER 16FR (SET/KITS/TRAYS/PACK) ×3 IMPLANT

## 2017-07-13 NOTE — ED Triage Notes (Signed)
Pt to ed with c/o hyperglycemia and vomiting yesterday and today.  Pt states he has taken his insulin but sugars remain in the 400's

## 2017-07-13 NOTE — H&P (Signed)
Patient ID: Shawn [name], male   DOB: 01-10-1986, 32 y.o.   MRN: [id number]  HPI Shawn [name] is a 32 y.o. male comes to the ER c/o N/V and abdominal pain. Pain started about 24 hrs ago, colicky, intermittent and moderate in intensity. Pain is diffuse and non radiated. No specific alleviating or aggravating factors. He states he had a similar but milder episode 4 years ago. His initial BS was > 600s, he is a Type I DM. HE did have some bilious emesis  But now is clear.  Ct scan p reviewed. Clear swirl sign w/o SBO or volvulus. D/W radiologist in detail.  HE has lost about 40 lbs over a course of 2 years . His BS has improved after adequate resuscitation. Creat 1.2, bicarb 27, wbc 9. HE had marihuana 5 days ago.   HPI  Past Medical History:  Diagnosis Date  . Diabetes mellitus without complication ([place])   . GERD (gastroesophageal reflux disease)   . IBS (irritable bowel syndrome)     History reviewed. No pertinent surgical history.  History reviewed. No pertinent family history.  Social History Social History   Tobacco Use  . Smoking status: Never Smoker  . Smokeless tobacco: Never Used  Substance Use Topics  . Alcohol use: No    Frequency: Never  . Drug use: No    Allergies  Allergen Reactions  . Keflex [Cephalexin] Anaphylaxis  . Sulfa Antibiotics Anaphylaxis    Current Facility-Administered Medications  Medication Dose Route Frequency Provider Last Rate Last Dose  . sodium chloride 0.9 % bolus 1,000 mL  1,000 mL Intravenous Once Khasir Woodrome, Marjory Lies, MD       No current outpatient medications on file.     Review of Systems Full ROS  was asked and was negative except for the information on the HPI  Physical Exam Blood pressure (!) 150/97, pulse 85, temperature 98.1 F (36.7 C), temperature source Oral, resp. rate 16, height 5\' 6"  (1.676 m), weight 49.9 kg (110 lb), SpO2 95 %. CONSTITUTIONAL: PT is in NAD EYES: Pupils are equal, round, and reactive to light,  Sclera are non-icteric. EARS, NOSE, MOUTH AND THROAT: The oropharynx is clear. The oral mucosa is pink and moist. Hearing is intact to voice. LYMPH NODES:  Lymph nodes in the neck are normal. RESPIRATORY:  Lungs are clear. There is normal respiratory effort, with equal breath sounds bilaterally, and without pathologic use of accessory muscles. CARDIOVASCULAR: Heart is regular without murmurs, gallops, or rubs. GI: The abdomen is  soft, Diffuse tenderness diffusely , more LUQ,  No definitive peritonitis. Decrease BS. GU: Rectal deferred.   MUSCULOSKELETAL: Normal muscle strength and tone. No cyanosis or edema.   SKIN: Turgor is good and there are no pathologic skin lesions or ulcers. NEUROLOGIC: Motor and sensation is grossly normal. Cranial nerves are grossly intact. PSYCH:  Oriented to person, place and time. Affect is normal.  Data Reviewed  I have personally reviewed the patient's imaging, laboratory findings and medical records.    Assessment/ Plan 32 yo male w Acute abdominal pain and findings of mesenteric swirl. I have personally reviewed his current films and compared them to the ones 4 years ago. I have also d/w the radiologist on call about the case in detail. Initially I was inclined to perform exploratory surgery but after a second review of the films and comparison to the previous ones, I do not see a volvulus or bowel obstruction. Final plan is to admit him to  the hospital, keep NPO, consult hospitalist and perform serial abdominal exams. IF he does not improve he may need exploratory laparotomy. He understands. Case d/w Dr. Clearnce Hasten and Dr. Jannifer Franklin in detail. We will order films, and repeat labs in am.   Caroleen Hamman, MD FACS General Surgeon 07/13/2017, 8:53 PM

## 2017-07-13 NOTE — Care Management Note (Signed)
Case Management Note  Patient Details  Name: Shawn [name] MRN: [id number] Date of Birth: 1986-02-19  Subjective/Objective:    Spoke to patient about his lack of insurance/ income affecting ability to get his medications. He says his father is on [facility] care so it is a program he is familiar with but has never applied. He was on [facility] at one point years ago, but not seen recently.  I have given him the applications for [facility], [facility] and [facility] across  [address] street. He appears very interested, and amenable. Thanking me for the information.MD made aware of the forms given.               Action/Plan:   Expected Discharge Date:                  Expected Discharge Plan:     In-House Referral:     Discharge planning Services     Post Acute Care Choice:    Choice offered to:     DME Arranged:    DME Agency:     HH Arranged:    HH Agency:     Status of Service:     If discussed at [employer] of Stay Meetings, dates discussed:    Additional Comments:  Beau Fanny, RN 07/13/2017, 2:11 PM

## 2017-07-13 NOTE — ED Provider Notes (Signed)
[facility] Emergency Department Provider Note  ____________________________________________   First MD Initiated Contact with Patient 07/13/17 1404     (approximate)  I have reviewed the triage vital signs and the nursing notes.   HISTORY  Chief Complaint Hyperglycemia   HPI Shawn [name] is a 32 y.o. male with a history of diabetes as well as GERD who is presenting to the emergency department today with abdominal pain as well as nausea vomiting and hyperglycemia.  The patient says that he started this episode with abdominal cramping yesterday as well as nausea and vomiting.  He says that the pain worsened after he vomited and is now going from his upper abdomen into the base of his chest.  He states that his sugars have been in the 400s.  He says the pain is cramping and is exacerbated by the vomiting.  He does not describe any radiation of the pain.  Patient says that he has been compliant with his insulin until this morning and has not been eating any excessively sugary foods.  Past Medical History:  Diagnosis Date  . Diabetes mellitus without complication ([place])   . GERD (gastroesophageal reflux disease)   . IBS (irritable bowel syndrome)     Patient Active Problem List   Diagnosis Date Noted  . GERD (gastroesophageal reflux disease) 12/30/2009  . Diabetes ([place]) 11/25/2009  . MDD (major depressive disorder) 11/25/2009  . Hypercholesterolemia 03/07/2008    History reviewed. No pertinent surgical history.  Prior to Admission medications   Not on File    Allergies Keflex [cephalexin] and Sulfa antibiotics  History reviewed. No pertinent family history.  Social History Social History   Tobacco Use  . Smoking status: Never Smoker  . Smokeless tobacco: Never Used  Substance Use Topics  . Alcohol use: No    Frequency: Never  . Drug use: No    Review of Systems  Constitutional: No fever/chills Eyes: No visual changes. ENT: No sore  throat. Cardiovascular: Denies chest pain. Respiratory: Denies shortness of breath. Gastrointestinal:  No diarrhea.  No constipation. Genitourinary: Negative for dysuria. Musculoskeletal: Negative for back pain. Skin: Negative for rash. Neurological: Negative for headaches, focal weakness or numbness.   ____________________________________________   PHYSICAL EXAM:  VITAL SIGNS: ED Triage Vitals  Enc Vitals Group     BP 07/13/17 1352 112/74     Pulse Rate 07/13/17 1352 (!) 123     Resp 07/13/17 1352 18     Temp 07/13/17 1352 98.1 F (36.7 C)     Temp Source 07/13/17 1352 Oral     SpO2 07/13/17 1352 97 %     Weight 07/13/17 1353 110 lb (49.9 kg)     Height 07/13/17 1353 5\' 6"  (1.676 m)     Head Circumference --      Peak Flow --      Pain Score 07/13/17 1352 9     Pain Loc --      Pain Edu? --      Excl. in [place]? --     Constitutional: Alert and oriented. Well appearing and in no acute distress. Eyes: Conjunctivae are normal.  Head: Atraumatic. Nose: No congestion/rhinnorhea. Mouth/Throat: Mucous membranes are dry.  Poor dentition. Neck: No stridor.   Cardiovascular: Tachycardic, regular rhythm. Grossly normal heart sounds.  Good peripheral circulation. Respiratory: Normal respiratory effort.  No retractions. Lungs CTAB. Gastrointestinal: Soft with diffuse tenderness to palpation which is moderate without any rebound or guarding. No distention.  Musculoskeletal: No  lower extremity tenderness nor edema.  No joint effusions. Neurologic:  Normal speech and language. No gross focal neurologic deficits are appreciated. Skin:  Skin is warm, dry and intact. No rash noted. Psychiatric: Mood and affect are normal. Speech and behavior are normal.  ____________________________________________   LABS (all labs ordered are listed, but only abnormal results are displayed)  Labs Reviewed  BASIC METABOLIC PANEL - Abnormal; Notable for the following components:      Result Value    Sodium 127 (*)    Chloride 85 (*)    Glucose, Bld 671 (*)    Creatinine, Ser 1.60 (*)    GFR calc non Af Amer 57 (*)    All other components within normal limits  CBC - Abnormal; Notable for the following components:   RBC 4.06 (*)    Hemoglobin 11.8 (*)    HCT 34.4 (*)    All other components within normal limits  URINALYSIS, COMPLETE (UACMP) WITH MICROSCOPIC - Abnormal; Notable for the following components:   Color, Urine STRAW (*)    APPearance CLEAR (*)    Glucose, UA >=500 (*)    Hgb urine dipstick SMALL (*)    Ketones, ur 5 (*)    Protein, ur 100 (*)    All other components within normal limits  GLUCOSE, CAPILLARY - Abnormal; Notable for the following components:   Glucose-Capillary 582 (*)    All other components within normal limits  BLOOD GAS, VENOUS - Abnormal; Notable for the following components:   pO2, Ven 46.0 (*)    Bicarbonate 33.1 (*)    Acid-Base Excess 7.1 (*)    All other components within normal limits  GLUCOSE, CAPILLARY - Abnormal; Notable for the following components:   Glucose-Capillary 434 (*)    All other components within normal limits  BASIC METABOLIC PANEL - Abnormal; Notable for the following components:   Sodium 131 (*)    Chloride 96 (*)    Glucose, Bld 394 (*)    Calcium 8.1 (*)    All other components within normal limits  GLUCOSE, CAPILLARY - Abnormal; Notable for the following components:   Glucose-Capillary 372 (*)    All other components within normal limits  GLUCOSE, CAPILLARY - Abnormal; Notable for the following components:   Glucose-Capillary 326 (*)    All other components within normal limits  TROPONIN I  CBG MONITORING, ED  CBG MONITORING, ED  CBG MONITORING, ED  CBG MONITORING, ED  CBG MONITORING, ED   ____________________________________________  EKG  ED ECG REPORT I, Doran Stabler, the attending physician, personally viewed and interpreted this ECG.   Date: 07/13/2017  EKG Time: 1424  Rate: 103  Rhythm:  sinus tachycardia  Axis: Normal  Intervals:none  ST&T Change: No ST segment elevation or depression.  No abnormal T wave inversion.  ____________________________________________  RADIOLOGY   ____________________________________________   PROCEDURES  Procedure(s) performed:   Procedures  Critical Care performed:   ____________________________________________   INITIAL IMPRESSION / ASSESSMENT AND PLAN / ED COURSE  Pertinent labs & imaging results that were available during my care of the patient were reviewed by me and considered in my medical decision making (see chart for details).  Differential diagnosis includes, but is not limited to, acute appendicitis, renal colic, testicular torsion, urinary tract infection/pyelonephritis, prostatitis,  epididymitis, diverticulitis, small bowel obstruction or ileus, colitis, abdominal aortic aneurysm, gastroenteritis, hernia, etc. Differential diagnosis includes, but is not limited to, biliary disease (biliary colic, acute cholecystitis, cholangitis, choledocholithiasis, etc), intrathoracic  causes for epigastric abdominal pain including ACS, gastritis, duodenitis, pancreatitis, small bowel or large bowel obstruction, abdominal aortic aneurysm, hernia, and gastritis. DKA As part of my medical decision making, I reviewed the following data within the electronic MEDICAL RECORD NUMBER Notes from prior ED visits  ----------------------------------------- 8:30 PM on 07/13/2017 -----------------------------------------  Patient at this time with persistent abdominal pain and now bilious vomiting despite his glucose being reduced to the 300s.  CAT scan showing possible internal hernia.  Patient denies any surgical history.  Discussed the case with Dr. Dahlia Byes of surgery who will be evaluating the patient.  Also discussed the case with Dr. Anselm Jungling for admission to the medicine service.  Pending surgery consult at this time.  Patient aware of need for  admission to the hospital.  Abdominal tenderness continues to be diffuse but the abdomen continues to be soft.      ____________________________________________   FINAL CLINICAL IMPRESSION(S) / ED DIAGNOSES  Hyperglycemia.  Intractable nausea and vomiting.  Intractable diffuse abdominal pain.    NEW MEDICATIONS STARTED DURING THIS VISIT:  This SmartLink is deprecated. Use AVSMEDLIST instead to display the medication list for a patient.   Note:  This document was prepared using Dragon voice recognition software and may include unintentional dictation errors.     Orbie Pyo, MD 07/13/17 2031

## 2017-07-13 NOTE — ED Notes (Signed)
Pt currently attempting to drink water per MD verbal order. PT reports he is less nauseous after receiving medications.

## 2017-07-13 NOTE — ED Notes (Signed)
Pt reports abd ain and nausea have started to return. Pt reports having just had 1 episode of vomiting. MD made aware.

## 2017-07-13 NOTE — ED Notes (Signed)
Pt has another episode of vomiting with small amount of emesis in bag. PT reports abd pain has returned.

## 2017-07-13 NOTE — ED Notes (Addendum)
Patient transported to CT 

## 2017-07-13 NOTE — Consult Note (Signed)
[place] [facility] [place] NAME: Shawn [name]    MR#:  [id number]  DATE OF BIRTH:  Dec 08, 1985  DATE OF ADMISSION:  07/13/2017  PRIMARY CARE PHYSICIAN: Patient, No Pcp Per   REQUESTING/REFERRING PHYSICIAN: Pabon, MD  CHIEF COMPLAINT:   Chief Complaint  Patient presents with  . Hyperglycemia    HISTORY OF PRESENT ILLNESS:  Shawn [name]  is a 32 y.o. male who presents with abdominal pain and nausea and vomiting.  Here in the ED he was found to have a glucose of greater than 600 initially, which corrected significantly with IV hydration as well as some subcu insulin.  Is currently in the 300s.  He was found on imaging to have some mesenteric swirling suggestive of possible internal hernia.  Surgery was called for admission and request consult by medicine to help manage his diabetes.  PAST MEDICAL HISTORY:   Past Medical History:  Diagnosis Date  . Diabetes mellitus without complication ([place])   . GERD (gastroesophageal reflux disease)   . IBS (irritable bowel syndrome)     PAST SURGICAL HISTOIRY:  History reviewed. No pertinent surgical history.  SOCIAL HISTORY:   Social History   Tobacco Use  . Smoking status: Never Smoker  . Smokeless tobacco: Never Used  Substance Use Topics  . Alcohol use: No    Frequency: Never    FAMILY HISTORY:  History reviewed. No pertinent family history.  DRUG ALLERGIES:   Allergies  Allergen Reactions  . Keflex [Cephalexin] Anaphylaxis  . Sulfa Antibiotics Anaphylaxis    REVIEW OF SYSTEMS:  Review of Systems  Constitutional: Negative for chills, fever, malaise/fatigue and weight loss.  HENT: Negative for ear pain, hearing loss and tinnitus.   Eyes: Negative for blurred vision, double vision, pain and redness.  Respiratory: Negative for cough, hemoptysis and shortness of breath.   Cardiovascular: Negative for chest pain, palpitations, orthopnea and leg swelling.  Gastrointestinal:  Positive for abdominal pain, nausea and vomiting. Negative for constipation and diarrhea.  Genitourinary: Negative for dysuria, frequency and hematuria.  Musculoskeletal: Negative for back pain, joint pain and neck pain.  Skin:       No acne, rash, or lesions  Neurological: Negative for dizziness, tremors, focal weakness and weakness.  Endo/Heme/Allergies: Negative for polydipsia. Does not bruise/bleed easily.  Psychiatric/Behavioral: Negative for depression. The patient is not nervous/anxious and does not have insomnia.     MEDICATIONS AT HOME:   Prior to Admission medications   Medication Sig Start Date End Date Taking? Authorizing Provider  insulin NPH-regular Human (NOVOLIN 70/30) (70-30) 100 UNIT/ML injection Frequency:PHARMDIR   Dosage:0.0     Instructions:  Note:Inject 30 units in the morning, 35 units in the evening. Dose: UAD 09/06/07  Yes [provider]  metoCLOPramide (REGLAN) 5 MG tablet Take by mouth. 07/08/13  Yes [provider]      VITAL SIGNS:   Vitals:   07/13/17 1845 07/13/17 1900 07/13/17 2101 07/13/17 2102  BP: (!) 143/97 (!) 150/97 (!) 154/102   Pulse: 88 85  (!) 102  Resp: 10 16  12   Temp:      TempSrc:      SpO2: 97% 95%  97%  Weight:      Height:       Wt Readings from Last 3 Encounters:  07/13/17 49.9 kg (110 lb)  12/30/09 73 kg (161 lb)    PHYSICAL EXAMINATION:  Physical Exam  Vitals reviewed. Constitutional: He is oriented to person,  place, and time. He appears well-developed and well-nourished. No distress.  HENT:  Head: Normocephalic and atraumatic.  Mouth/Throat: Oropharynx is clear and moist.  Eyes: Conjunctivae and EOM are normal. Pupils are equal, round, and reactive to light. No scleral icterus.  Neck: Normal range of motion. Neck supple. No JVD present. No thyromegaly present.  Cardiovascular: Normal rate, regular rhythm and intact distal pulses. Exam reveals no gallop and no friction rub.  No murmur  heard. Respiratory: Effort normal and breath sounds normal. No respiratory distress. He has no wheezes. He has no rales.  GI: Soft. Bowel sounds are normal. He exhibits no distension. There is tenderness.  Musculoskeletal: Normal range of motion. He exhibits no edema.  No arthritis, no gout  Lymphadenopathy:    He has no cervical adenopathy.  Neurological: He is alert and oriented to person, place, and time. No cranial nerve deficit.  No dysarthria, no aphasia  Skin: Skin is warm and dry. No rash noted. No erythema.  Psychiatric: He has a normal mood and affect. His behavior is normal. Judgment and thought content normal.     LABORATORY PANEL:   CBC Recent Labs  Lab 07/13/17 1423  WBC 9.1  HGB 11.8*  HCT 34.4*  PLT 259   ------------------------------------------------------------------------------------------------------------------  Chemistries  Recent Labs  Lab 07/13/17 1747  NA 131*  K 3.5  CL 96*  CO2 27  GLUCOSE 394*  BUN 17  CREATININE 1.22  CALCIUM 8.1*   ------------------------------------------------------------------------------------------------------------------  Cardiac Enzymes Recent Labs  Lab 07/13/17 1423  TROPONINI <0.03   ------------------------------------------------------------------------------------------------------------------  RADIOLOGY:  Dg Chest 1 View  Result Date: 07/13/2017 CLINICAL DATA:  Hyperglycemia and vomiting. EXAM: CHEST 1 VIEW COMPARISON:  Chest x-ray dated April 17, 2006. FINDINGS: The heart size and mediastinal contours are within normal limits. Both lungs are clear. The visualized skeletal structures are unremarkable. IMPRESSION: No active disease. Electronically Signed   By: Titus Dubin M.D.   On: 07/13/2017 14:50   Ct Abdomen Pelvis W Contrast  Result Date: 07/13/2017 CLINICAL DATA:  Hyperglycemia with vomiting since yesterday. Mid abdominal pain with nausea, vomiting and diarrhea. Diabetes. EXAM: CT ABDOMEN  AND PELVIS WITH CONTRAST TECHNIQUE: Multidetector CT imaging of the abdomen and pelvis was performed using the standard protocol following bolus administration of intravenous contrast. CONTRAST:  80mL ISOVUE-300 IOPAMIDOL (ISOVUE-300) INJECTION 61% COMPARISON:  CT 03/30/2007 and 05/07/2011. FINDINGS: Lower chest: Clear lung bases. No significant pleural effusion. There is a moderate size hiatal hernia with mild distal esophageal wall thickening. Hepatobiliary: The liver is normal in density without focal abnormality. No evidence of gallstones, gallbladder wall thickening or biliary dilatation. Pancreas: Unremarkable. No pancreatic ductal dilatation or surrounding inflammatory changes. Spleen: Normal in size without focal abnormality. Adrenals/Urinary Tract: Both adrenal glands appear normal. The kidneys appear normal without evidence of urinary tract calculus, suspicious lesion or hydronephrosis. No bladder abnormalities are seen. The bladder is distended. Stomach/Bowel: No evidence of bowel wall thickening, distention or surrounding inflammatory change. As above, moderate-size hiatal hernia with distal esophageal wall thickening. The appendix appears normal. Vascular/Lymphatic: There are no enlarged abdominal or pelvic lymph nodes. No atherosclerosis or acute vascular findings seen. Reproductive: The prostate gland and seminal vesicles appear unremarkable. Other: There is new swirling of the left mesenteric vessels, best seen on sagittal images 64 through 78. No discrete internal hernia, bowel wall thickening or vascular occlusion identified in this area. Patient has lost significant mesenteric fat since the previous study. There is mild edema throughout the subcutaneous and deep  abdominal fat. Musculoskeletal: No acute or significant osseous findings. IMPRESSION: 1. New swirling of the mesenteric vessels in the left mid abdomen without evidence of bowel obstruction, volvulus, vascular occlusion or surrounding  inflammation. This finding could be related to interval weight loss, although suggests the possibility of an internal hernia which is not definitely demonstrated. 2. Moderate size hiatal hernia with mild distal esophageal wall thickening. 3. The appendix appears normal. 4. Distended bladder. Electronically Signed   By: Richardean Sale M.D.   On: 07/13/2017 20:00    EKG:   Orders placed or performed during the hospital encounter of 07/13/17  . ED EKG  . ED EKG  . EKG 12-Lead  . EKG 12-Lead    IMPRESSION AND PLAN:  Principal Problem:   Abdominal pain -possible internal hernia as evidenced by CT scan.  Surgical team following along his primary team.  Patient is n.p.o. for now with serial abdominal exam, with possibility of exploratory laparotomy if he does not improve. Active Problems:   Diabetes ([place]) -sliding scale insulin with corresponding glucose checks for now.  Patient is on NPH 70/30 mix at home.  He will likely need long-acting while he is here as well, but as he is n.p.o. for now we will just continue with every 6 hours correctional insulin until he is able to start eating again.  All the records are reviewed and case discussed with ED provider. Management plans discussed with the patient and/or family.  CODE STATUS: Full    Code Status Orders  (From admission, onward)        Start     Ordered   07/13/17 2110  Full code  Continuous     07/13/17 2112    Code Status History    Date Active Date Inactive Code Status Order ID Comments User Context   This patient has a current code status but no historical code status.      TOTAL TIME TAKING CARE OF THIS PATIENT: 30 minutes.    Shawn [name] [place] 07/13/2017, 9:32 PM  [facility]  Office  [phone]  CC: Primary care Physician: Patient, No Pcp Per  Note:  This document was prepared using Dragon voice recognition software and may include unintentional dictation errors.

## 2017-07-14 ENCOUNTER — Inpatient Hospital Stay: Payer: Self-pay

## 2017-07-14 ENCOUNTER — Other Ambulatory Visit: Payer: Self-pay

## 2017-07-14 DIAGNOSIS — E43 Unspecified severe protein-calorie malnutrition: Secondary | ICD-10-CM

## 2017-07-14 DIAGNOSIS — R1084 Generalized abdominal pain: Secondary | ICD-10-CM

## 2017-07-14 LAB — CBC
HEMATOCRIT: 31.1 % — AB (ref 40.0–52.0)
HEMOGLOBIN: 10.7 g/dL — AB (ref 13.0–18.0)
MCH: 29.2 pg (ref 26.0–34.0)
MCHC: 34.2 g/dL (ref 32.0–36.0)
MCV: 85.4 fL (ref 80.0–100.0)
Platelets: 236 10*3/uL (ref 150–440)
RBC: 3.64 MIL/uL — AB (ref 4.40–5.90)
RDW: 12.4 % (ref 11.5–14.5)
WBC: 7.5 10*3/uL (ref 3.8–10.6)

## 2017-07-14 LAB — GLUCOSE, CAPILLARY
GLUCOSE-CAPILLARY: 234 mg/dL — AB (ref 65–99)
GLUCOSE-CAPILLARY: 143 mg/dL — AB (ref 65–99)
Glucose-Capillary: 253 mg/dL — ABNORMAL HIGH (ref 65–99)
Glucose-Capillary: 162 mg/dL — ABNORMAL HIGH (ref 65–99)

## 2017-07-14 LAB — COMPREHENSIVE METABOLIC PANEL
ALT: 19 U/L (ref 17–63)
AST: 19 U/L (ref 15–41)
Albumin: 3.1 g/dL — ABNORMAL LOW (ref 3.5–5.0)
Alkaline Phosphatase: 64 U/L (ref 38–126)
Anion gap: 9 (ref 5–15)
BUN: 13 mg/dL (ref 6–20)
CHLORIDE: 102 mmol/L (ref 101–111)
CO2: 27 mmol/L (ref 22–32)
CREATININE: 1.13 mg/dL (ref 0.61–1.24)
Calcium: 8.7 mg/dL — ABNORMAL LOW (ref 8.9–10.3)
GFR calc non Af Amer: >60 mL/min (ref >60)
Glucose, Bld: 230 mg/dL — ABNORMAL HIGH (ref 65–99)
POTASSIUM: 3.7 mmol/L (ref 3.5–5.1)
SODIUM: 138 mmol/L (ref 135–145)
Total Bilirubin: 1 mg/dL (ref 0.3–1.2)
Total Protein: 5.6 g/dL — ABNORMAL LOW (ref 6.5–8.1)

## 2017-07-14 LAB — HEMOGLOBIN A1C
HEMOGLOBIN A1C: 16.1 % — AB (ref 4.8–5.6)
Mean Plasma Glucose: 415.37 mg/dL

## 2017-07-14 LAB — MAGNESIUM: MAGNESIUM: 1.6 mg/dL — AB (ref 1.7–2.4)

## 2017-07-14 LAB — URINE DRUG SCREEN, QUALITATIVE (ARMC ONLY)
Amphetamines, Ur Screen: NOT DETECTED
Barbiturates, Ur Screen: NOT DETECTED
Benzodiazepine, Ur Scrn: NOT DETECTED
COCAINE METABOLITE, UR ~~LOC~~: NOT DETECTED
Cannabinoid 50 Ng, Ur ~~LOC~~: POSITIVE — AB
MDMA (Ecstasy)Ur Screen: NOT DETECTED
Methadone Scn, Ur: NOT DETECTED
OPIATE, UR SCREEN: POSITIVE — AB
Phencyclidine (PCP) Ur S: NOT DETECTED
Tricyclic, Ur Screen: NOT DETECTED

## 2017-07-14 MED ORDER — INSULIN DETEMIR 100 UNIT/ML ~~LOC~~ SOLN
6.0000 [IU] | Freq: Every day | SUBCUTANEOUS | Status: DC
  Administered 2017-07-14: 6 [IU] via SUBCUTANEOUS
  Filled 2017-07-14: qty 0.06

## 2017-07-14 MED ORDER — MAGNESIUM OXIDE 400 (241.3 MG) MG PO TABS
400.0000 mg | ORAL_TABLET | Freq: Two times a day (BID) | ORAL | Status: AC
  Administered 2017-07-14: 400 mg via ORAL
  Filled 2017-07-14 (×2): qty 1

## 2017-07-14 MED ORDER — INSULIN DETEMIR 100 UNIT/ML ~~LOC~~ SOLN
8.0000 [IU] | Freq: Every day | SUBCUTANEOUS | Status: DC
  Administered 2017-07-14: 8 [IU] via SUBCUTANEOUS
  Filled 2017-07-14 (×2): qty 0.08

## 2017-07-14 MED ORDER — INSULIN DETEMIR 100 UNIT/ML ~~LOC~~ SOLN
8.0000 [IU] | Freq: Every day | SUBCUTANEOUS | Status: DC
  Administered 2017-07-15: 8 [IU] via SUBCUTANEOUS
  Filled 2017-07-14: qty 0.08

## 2017-07-14 NOTE — Care Management (Signed)
RNCM following for discharge planning needs.  If patient discharges during the week can obtain insulin from Medication Management.  Their preference if appropriate at discharge is Levimer vial and Humalog vial.

## 2017-07-14 NOTE — Progress Notes (Signed)
Inpatient Diabetes Program Recommendations  AACE/ADA: New Consensus Statement on Inpatient Glycemic Control (2015)  Target Ranges:  Prepandial:   less than 140 mg/dL      Peak postprandial:   less than 180 mg/dL (1-2 hours)      Critically ill patients:  140 - 180 mg/dL   Lab Results  Component Value Date   GLUCAP 234 (H) 07/14/2017   HGBA1C 16.1 (H) 07/13/2017    Review of Glycemic Control  Results for MARKE, GOODWYN (MRN [id number]) as of 07/14/2017 08:34  Ref. Range 07/13/2017 16:08 07/13/2017 17:40 07/13/2017 19:39 07/13/2017 22:44 07/14/2017 06:08  Glucose-Capillary Latest Ref Range: 65 - 99 mg/dL 434 (H) 372 (H) 326 (H) 280 (H) 234 (H)    Diabetes history: Type 1 Outpatient Diabetes medications: Novolin 70/30 10 units qam,  Novolin 70/30 15 units qpm Current orders for Inpatient glycemic control: Novolog 0-20 units q6h   Inpatient Diabetes Program Recommendations:  Please start Levemir 6 units qam, Levemir 8 units pm, change Novolog correction scale to sensitive (0-9 units tid).  Once he is tolerating a diet,  add Novolog 2 units tid (hold if he eats less than 50%) and continue Novolog 0-9 units tid.   Clear liquid diet should be carb modified.   Gentry Fitz, RN, BA, MHA, CDE Diabetes Coordinator Inpatient Diabetes Program  [phone] (Team Pager) [phone] ([place]) 07/14/2017 8:39 AM

## 2017-07-14 NOTE — Progress Notes (Signed)
Shawn [name] at [place] NAME: Shawn [name]    MR#:  [id number]  DATE OF BIRTH:  1986/06/29  SUBJECTIVE:  Patient came in with abdominal pain and vomiting.  He is feeling better today.  He is able to tolerate liquid diet.  His parents are in the room. Sugars are improving. He reports he manages his diabetes on his own.  He does not have a follow-up appointment because he cannot afford it  REVIEW OF SYSTEMS:   Review of Systems  Constitutional: Negative for chills, fever and weight loss.  HENT: Negative for ear discharge, ear pain and nosebleeds.   Eyes: Negative for blurred vision, pain and discharge.  Respiratory: Negative for sputum production, shortness of breath, wheezing and stridor.   Cardiovascular: Negative for chest pain, palpitations, orthopnea and PND.  Gastrointestinal: Negative for abdominal pain, diarrhea, nausea and vomiting.  Genitourinary: Negative for frequency and urgency.  Musculoskeletal: Negative for back pain and joint pain.  Neurological: Positive for weakness. Negative for sensory change, speech change and focal weakness.  Psychiatric/Behavioral: Negative for depression and hallucinations. The patient is not nervous/anxious.    Tolerating Diet:yes Tolerating PT: ambulatory  DRUG ALLERGIES:   Allergies  Allergen Reactions  . Keflex [Cephalexin] Anaphylaxis  . Sulfa Antibiotics Anaphylaxis    VITALS:  Blood pressure 126/88, pulse 95, temperature 98.7 F (37.1 C), temperature source Oral, resp. rate 16, height 5\' 6"  (1.676 m), weight 49.9 kg (110 lb), SpO2 99 %.  PHYSICAL EXAMINATION:   Physical Exam  GENERAL:  32 y.o.-year-old patient lying in the bed with no acute distress.  EYES: Pupils equal, round, reactive to light and accommodation. No scleral icterus. Extraocular muscles intact.  HEENT: Head atraumatic, normocephalic. Oropharynx and nasopharynx clear.  NECK:  Supple, no jugular venous  distention. No thyroid enlargement, no tenderness.  LUNGS: Normal breath sounds bilaterally, no wheezing, rales, rhonchi. No use of accessory muscles of respiration.  CARDIOVASCULAR: S1, S2 normal. No murmurs, rubs, or gallops.  ABDOMEN: Soft, nontender, nondistended. Bowel sounds present. No organomegaly or mass.  EXTREMITIES: No cyanosis, clubbing or edema b/l.    NEUROLOGIC: Cranial nerves II through XII are intact. No focal Motor or sensory deficits b/l.   PSYCHIATRIC:  patient is alert and oriented x 3.  SKIN: No obvious rash, lesion, or ulcer.   LABORATORY PANEL:  CBC Recent Labs  Lab 07/14/17 0539  WBC 7.5  HGB 10.7*  HCT 31.1*  PLT 236    Chemistries  Recent Labs  Lab 07/14/17 0539  NA 138  K 3.7  CL 102  CO2 27  GLUCOSE 230*  BUN 13  CREATININE 1.13  CALCIUM 8.7*  MG 1.6*  AST 19  ALT 19  ALKPHOS 64  BILITOT 1.0   Cardiac Enzymes Recent Labs  Lab 07/13/17 1423  TROPONINI <0.03   RADIOLOGY:  Dg Chest 1 View  Result Date: 07/13/2017 CLINICAL DATA:  Hyperglycemia and vomiting. EXAM: CHEST 1 VIEW COMPARISON:  Chest x-ray dated April 17, 2006. FINDINGS: The heart size and mediastinal contours are within normal limits. Both lungs are clear. The visualized skeletal structures are unremarkable. IMPRESSION: No active disease. Electronically Signed   By: Titus Dubin M.D.   On: 07/13/2017 14:50   Ct Abdomen Pelvis W Contrast  Result Date: 07/13/2017 CLINICAL DATA:  Hyperglycemia with vomiting since yesterday. Mid abdominal pain with nausea, vomiting and diarrhea. Diabetes. EXAM: CT ABDOMEN AND PELVIS WITH CONTRAST TECHNIQUE: Multidetector CT imaging of the abdomen  and pelvis was performed using the standard protocol following bolus administration of intravenous contrast. CONTRAST:  45mL ISOVUE-300 IOPAMIDOL (ISOVUE-300) INJECTION 61% COMPARISON:  CT 03/30/2007 and 05/07/2011. FINDINGS: Lower chest: Clear lung bases. No significant pleural effusion. There is a  moderate size hiatal hernia with mild distal esophageal wall thickening. Hepatobiliary: The liver is normal in density without focal abnormality. No evidence of gallstones, gallbladder wall thickening or biliary dilatation. Pancreas: Unremarkable. No pancreatic ductal dilatation or surrounding inflammatory changes. Spleen: Normal in size without focal abnormality. Adrenals/Urinary Tract: Both adrenal glands appear normal. The kidneys appear normal without evidence of urinary tract calculus, suspicious lesion or hydronephrosis. No bladder abnormalities are seen. The bladder is distended. Stomach/Bowel: No evidence of bowel wall thickening, distention or surrounding inflammatory change. As above, moderate-size hiatal hernia with distal esophageal wall thickening. The appendix appears normal. Vascular/Lymphatic: There are no enlarged abdominal or pelvic lymph nodes. No atherosclerosis or acute vascular findings seen. Reproductive: The prostate gland and seminal vesicles appear unremarkable. Other: There is new swirling of the left mesenteric vessels, best seen on sagittal images 64 through 78. No discrete internal hernia, bowel wall thickening or vascular occlusion identified in this area. Patient has lost significant mesenteric fat since the previous study. There is mild edema throughout the subcutaneous and deep abdominal fat. Musculoskeletal: No acute or significant osseous findings. IMPRESSION: 1. New swirling of the mesenteric vessels in the left mid abdomen without evidence of bowel obstruction, volvulus, vascular occlusion or surrounding inflammation. This finding could be related to interval weight loss, although suggests the possibility of an internal hernia which is not definitely demonstrated. 2. Moderate size hiatal hernia with mild distal esophageal wall thickening. 3. The appendix appears normal. 4. Distended bladder. Electronically Signed   By: Richardean Sale M.D.   On: 07/13/2017 20:00   Dg Abd  Portable 2v  Result Date: 07/14/2017 CLINICAL DATA:  Abdominal pain EXAM: PORTABLE ABDOMEN - 2 VIEW COMPARISON:  None. FINDINGS: The bowel gas pattern is normal. There is no evidence of free air. No radio-opaque calculi or other significant radiographic abnormality is seen. There is excreted contrast in the urinary bladder. IMPRESSION: Normal abdominal radiographs. Electronically Signed   By: Ulyses Jarred M.D.   On: 07/14/2017 06:25   ASSESSMENT AND PLAN:   Shawn [name]  is a 32 y.o. male who presents with abdominal pain and nausea and vomiting.  Here in the ED he was found to have a glucose of greater than 600 initially, which corrected significantly with IV hydration as well as some subcu insulin.  Is currently in the 300s.  *Abdominal pain -possible internal hernia as evidenced by CT scan.  Surgical team following along his primary team.   -Patient has started on clear liquid diet tolerating well.  He does not have much abdominal pain.  *Uncontrolled  Diabetes ([place]) -sliding scale insulin with corresponding glucose checks for now.  -Started on Levemir 8 units twice daily and high-dose sliding scale insulin. -Patient [facility].  He takes over-the-counter insulin and manages his diabetes by himself. -addressed the importance of getting yearly eye exam, feet exam and A1c drawn and insulin adjusted.  Patient voiced understanding.  Discussed same with patient's parents   Case discussed with Care Management/Social Worker. Management plans discussed with the patient, family and they are in agreement.  CODE STATUS: Full  DVT Prophylaxis: lovenox  TOTAL TIME TAKING CARE OF THIS PATIENT: *30* minutes.  >50% time spent on counselling  and coordination of care  POSSIBLE D/C IN 1-2 DAYS, DEPENDING ON CLINICAL CONDITION.  Note: This dictation was prepared with Dragon dictation along with smaller phrase technology. Any transcriptional errors  that result from this process are unintentional.  Fritzi Mandes M.D on 07/14/2017 at 2:35 PM  Between 7am to 6pm - Pager - [phone]  After 6pm go to [email or web address] - password [email or web address] [facility]  Office  [phone]  CC: Primary care physician; Patient, No Pcp PerPatient ID: Orest Dikes, male   DOB: 01-21-1986, 32 y.o.   MRN: [id number]

## 2017-07-14 NOTE — Progress Notes (Signed)
CC: Abdominal pain Subjective: Patient reports that his abdominal pain has completely resolved.  He states he is hungry and thirsty.  Has been passing flatus.  Objective: Vital signs in last 24 hours: Temp:  [98.1 F (36.7 C)-98.3 F (36.8 C)] 98.3 F (36.8 C) (01/09 2307) Pulse Rate:  [82-123] 99 (01/09 2307) Resp:  [10-26] 18 (01/09 2307) BP: (110-154)/(74-102) 137/89 (01/09 2307) SpO2:  [94 %-100 %] 100 % (01/09 2307) Weight:  [49.9 kg (110 lb)] 49.9 kg (110 lb) (01/09 1353) Last BM Date: 07/13/17  Intake/Output from previous day: 01/09 0701 - 01/10 0700 In: 4980 [I.V.:980; IV Piggyback:4000] Out: -  Intake/Output this shift: No intake/output data recorded.  Physical exam:  General: No acute distress Chest: Clear to auscultation Heart: Regular rate and rhythm Abdomen: Soft, nontender, nondistended  Lab Results: CBC  Recent Labs    07/13/17 1423 07/14/17 0539  WBC 9.1 7.5  HGB 11.8* 10.7*  HCT 34.4* 31.1*  PLT 259 236   BMET Recent Labs    07/13/17 1747 07/14/17 0539  NA 131* 138  K 3.5 3.7  CL 96* 102  CO2 27 27  GLUCOSE 394* 230*  BUN 17 13  CREATININE 1.22 1.13  CALCIUM 8.1* 8.7*   PT/INR No results for input(s): LABPROT, INR in the last 72 hours. ABG Recent Labs    07/13/17 1423  HCO3 33.1*    Studies/Results: Dg Chest 1 View  Result Date: 07/13/2017 CLINICAL DATA:  Hyperglycemia and vomiting. EXAM: CHEST 1 VIEW COMPARISON:  Chest x-ray dated April 17, 2006. FINDINGS: The heart size and mediastinal contours are within normal limits. Both lungs are clear. The visualized skeletal structures are unremarkable. IMPRESSION: No active disease. Electronically Signed   By: Titus Dubin M.D.   On: 07/13/2017 14:50   Ct Abdomen Pelvis W Contrast  Result Date: 07/13/2017 CLINICAL DATA:  Hyperglycemia with vomiting since yesterday. Mid abdominal pain with nausea, vomiting and diarrhea. Diabetes. EXAM: CT ABDOMEN AND PELVIS WITH CONTRAST TECHNIQUE:  Multidetector CT imaging of the abdomen and pelvis was performed using the standard protocol following bolus administration of intravenous contrast. CONTRAST:  69mL ISOVUE-300 IOPAMIDOL (ISOVUE-300) INJECTION 61% COMPARISON:  CT 03/30/2007 and 05/07/2011. FINDINGS: Lower chest: Clear lung bases. No significant pleural effusion. There is a moderate size hiatal hernia with mild distal esophageal wall thickening. Hepatobiliary: The liver is normal in density without focal abnormality. No evidence of gallstones, gallbladder wall thickening or biliary dilatation. Pancreas: Unremarkable. No pancreatic ductal dilatation or surrounding inflammatory changes. Spleen: Normal in size without focal abnormality. Adrenals/Urinary Tract: Both adrenal glands appear normal. The kidneys appear normal without evidence of urinary tract calculus, suspicious lesion or hydronephrosis. No bladder abnormalities are seen. The bladder is distended. Stomach/Bowel: No evidence of bowel wall thickening, distention or surrounding inflammatory change. As above, moderate-size hiatal hernia with distal esophageal wall thickening. The appendix appears normal. Vascular/Lymphatic: There are no enlarged abdominal or pelvic lymph nodes. No atherosclerosis or acute vascular findings seen. Reproductive: The prostate gland and seminal vesicles appear unremarkable. Other: There is new swirling of the left mesenteric vessels, best seen on sagittal images 64 through 78. No discrete internal hernia, bowel wall thickening or vascular occlusion identified in this area. Patient has lost significant mesenteric fat since the previous study. There is mild edema throughout the subcutaneous and deep abdominal fat. Musculoskeletal: No acute or significant osseous findings. IMPRESSION: 1. New swirling of the mesenteric vessels in the left mid abdomen without evidence of bowel obstruction, volvulus, vascular occlusion  or surrounding inflammation. This finding could be  related to interval weight loss, although suggests the possibility of an internal hernia which is not definitely demonstrated. 2. Moderate size hiatal hernia with mild distal esophageal wall thickening. 3. The appendix appears normal. 4. Distended bladder. Electronically Signed   By: Richardean Sale M.D.   On: 07/13/2017 20:00   Dg Abd Portable 2v  Result Date: 07/14/2017 CLINICAL DATA:  Abdominal pain EXAM: PORTABLE ABDOMEN - 2 VIEW COMPARISON:  None. FINDINGS: The bowel gas pattern is normal. There is no evidence of free air. No radio-opaque calculi or other significant radiographic abnormality is seen. There is excreted contrast in the urinary bladder. IMPRESSION: Normal abdominal radiographs. Electronically Signed   By: Ulyses Jarred M.D.   On: 07/14/2017 06:25    Anti-infectives: Anti-infectives (From admission, onward)   None      Assessment/Plan:  32 year old male with multiple comorbidities and a CT scan concerning for internal hernia on admission last night.  All of his abdominal complaints have completely resolved.  Appreciate internal medicine assistance with his medical problems.  Plan to allow him to have clear liquids this morning and continue serial abdominal exams.  So long as his abdominal pain, nausea, vomiting remain resolved there will be no indication for urgent operation.  Encourage ambulation, incentive spirometer usage, oral intake.  Levar Fayson T. Adonis Huguenin, MD, [facility] General Surgeon [facility]  Day [facility] [phone] Night [facility] [phone] 07/14/2017

## 2017-07-14 NOTE — Progress Notes (Signed)
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  GOAL:   Patient will meet greater than or equal to 90% of their needs  MONITOR:   Labs, Diet advancement, Weight trends, I & O's  REASON FOR ASSESSMENT:   Malnutrition Screening Tool    ASSESSMENT:   32 year old male with h/o Type I DM admitted for concern of internal hernia    Met with pt in room today. Pt reports chronic vomiting and difficulty keeping food down for the past year. Pt reports that he will vomit after he eats certain foods. Pt avoids greasy and fatty foods. RD unsure if pt with gastroparesis. Pt has not eaten anything substantial in 4 days. Pt reports that he weighed 170lbs one year ago; this would mean that pt has lost 60lbs(35%) over the past year which is severe. Pt reports chronic diarrhea that he takes imodium for daily. Pt reports he rarely has constipation. Pt with uncontrolled type I diabetes that he was diagnosed with at 31 yrs old. Pt initiated on a clear liquid diet this morning but vomited after eating and was made NPO again. Recommend vanilla Premier Protein and MVI when diet advanced. Recommend TPN if unable to advance diet in 1-2 days; pt at high refeeding risk.     Medications reviewed and include: lovenox, insulin, Mg oxide, LRS '@75ml'$ /hr, morphine, zofran  Labs reviewed: Ca 8.7(L) adj. 9.42 wnl, Mg 1.6(L), alb 3.1(L) cbgs- 671, 394, 230 x 48 hrs AIC 16.1(H)- 1/9  Nutrition-Focused physical exam completed. Findings are moderate fat depletions in arms and chest, moderate muscle depletions over  entire body, and no edema.   Diet Order:  Diet NPO time specified  EDUCATION NEEDS:   No education needs have been identified at this time  Skin: Reviewed RN Assessment  Last BM:  1/10- TYPE 6   Height:   Ht Readings from Last 1 Encounters:  07/13/17 '5\' 6"'$  (1.676 m)    Weight:   Wt Readings from Last 1 Encounters:  07/13/17 110 lb (49.9 kg)    Ideal Body Weight:  64.5 kg  BMI:  Body mass index is 17.75 kg/m.  Estimated Nutritional Needs:   Kcal:  1800-2100kcal/day   Protein:  75-85g/day   Fluid:  >1.8L/day   [name] MS, RD, LDN Pager #[phone] After Hours Pager: [phone]

## 2017-07-14 NOTE — Progress Notes (Signed)
Inpatient Diabetes Program Recommendations  AACE/ADA: New Consensus Statement on Inpatient Glycemic Control (2015)  Target Ranges:  Prepandial:   less than 140 mg/dL      Peak postprandial:   less than 180 mg/dL (1-2 hours)      Critically ill patients:  140 - 180 mg/dL   Lab Results  Component Value Date   GLUCAP 253 (H) 07/14/2017   HGBA1C 16.1 (H) 07/13/2017     Inpatient Diabetes Program Recommendations:  spoke to patient and his grandparents at the bedside today- he was feeling very unwell.  States he has had diabetes since age 32 and that he is taking Novolin 70/30 insulin daily 10 units qam and 15 units pre-supper.  He has not insurance and no MD- he tells me he has been buying his insulin at [employer]. Reports using a bottle of insulin, not insulin pens. He has recently moved here from [place] and his grandparents reveal he had been hospitalized within the last 3 months there in [place].    Patient has not seen a regular primary care provider for years- he was shocked by the A1C of 16.1% and aware it was high- he knew it should be near 7%.  Gentry Fitz, RN, BA, MHA, CDE Diabetes Coordinator Inpatient Diabetes Program  [phone] (Team Pager) [phone] ([place]) 07/14/2017 1:33 PM

## 2017-07-14 NOTE — Progress Notes (Signed)
Pt. did not tolerate clear liquid diet. Vomited twice.MD aware and ordered to put pt. Back to NPO.

## 2017-07-15 DIAGNOSIS — Z8669 Personal history of other diseases of the nervous system and sense organs: Secondary | ICD-10-CM | POA: Insufficient documentation

## 2017-07-15 DIAGNOSIS — K589 Irritable bowel syndrome without diarrhea: Secondary | ICD-10-CM | POA: Insufficient documentation

## 2017-07-15 LAB — GLUCOSE, CAPILLARY
GLUCOSE-CAPILLARY: 206 mg/dL — AB (ref 65–99)
Glucose-Capillary: 119 mg/dL — ABNORMAL HIGH (ref 65–99)
Glucose-Capillary: 88 mg/dL (ref 65–99)

## 2017-07-15 MED ORDER — PANTOPRAZOLE SODIUM 40 MG PO TBEC
40.0000 mg | DELAYED_RELEASE_TABLET | Freq: Every day | ORAL | Status: DC
  Administered 2017-07-15: 40 mg via ORAL
  Filled 2017-07-15: qty 1

## 2017-07-15 MED ORDER — ONDANSETRON 4 MG PO TBDP: 4.0000 mg | ORAL_TABLET | Freq: Four times a day (QID) | ORAL | 0 refills | Status: DC | PRN

## 2017-07-15 MED ORDER — PREMIER PROTEIN SHAKE
11.0000 [oz_av] | Freq: Two times a day (BID) | ORAL | Status: DC
  Administered 2017-07-15: 11 [oz_av] via ORAL

## 2017-07-15 MED ORDER — ADULT MULTIVITAMIN W/MINERALS CH
1.0000 | ORAL_TABLET | Freq: Every day | ORAL | Status: DC
  Administered 2017-07-15: 1 via ORAL
  Filled 2017-07-15: qty 1

## 2017-07-15 NOTE — Discharge Instructions (Signed)

## 2017-07-15 NOTE — Care Management (Signed)
Patient to discharge home today.  Patient provided with an additional application to [facility], Medication Management  And "The Network:  Your Guide to [employer] in [place] [place] [facility]  Booklet.  Scripts for 70/30 and zofran sent to Medication management.  Patient can pick up scripts at no cost after discharge.  Reinforced with patient that he must follow through with obtaining a PCP at [facility] in order to continue utilizing Medication Management.  Patient provided with glucometer, 200 lancets, and 100 strips.  RNCM signing off.

## 2017-07-15 NOTE — Discharge Summary (Signed)
Patient ID: Shawn [name] MRN: [id number] DOB/AGE: 12/29/1985 32 y.o.  Admit date: 07/13/2017 Discharge date: 07/15/2017  Discharge Diagnoses:  Gastroenteritis   Procedures Performed: None  Discharged Condition: good  Hospital Course: Patient admitted with abdominal pain, nausea and vomiting. Underwent imaging during the hospital stay that ruled out a mechanical bowel obstruction. Internal medicine was consulted due to his uncontrolled diabetes and assisted with his insulin regimen. On day of discharge he was tolerating a regular diet and his abdomen was benign.  Discharge Orders: Discharge Instructions    Call MD for:  persistant nausea and vomiting   Complete by:  As directed    Call MD for:  severe uncontrolled pain   Complete by:  As directed    Call MD for:  temperature >100.4   Complete by:  As directed    Diet - low sodium heart healthy   Complete by:  As directed    Increase activity slowly   Complete by:  As directed       Disposition:   Discharge Medications: Allergies as of 07/15/2017      Reactions   Keflex [cephalexin] Anaphylaxis   Sulfa Antibiotics Anaphylaxis      Medication List    STOP taking these medications   metoCLOPramide 5 MG tablet Commonly known as:  REGLAN     TAKE these medications   insulin NPH-regular Human (70-30) 100 UNIT/ML injection Commonly known as:  NOVOLIN 70/30 Frequency:PHARMDIR   Dosage:0.0     Instructions:  Note:Inject 10 units in the morning, 15 units in the evening. Dose: UAD   ondansetron 4 MG disintegrating tablet Commonly known as:  ZOFRAN-ODT Take 1 tablet (4 mg total) by mouth every 6 (six) hours as needed for nausea.        Follwup: Follow-up [facility]. Schedule an appointment as soon as possible for a visit in 2 week(s).   Specialty:  General Surgery Why:  Hospital Follow up for abnormal CT of abdomen Contact information: [place] [phone]          Signed: Clayburn Pert 07/15/2017, 12:04 PM

## 2017-07-15 NOTE — Progress Notes (Signed)
Shawn [name] NAME: Shawn [name]    MR#:  [id number]  DATE OF BIRTH:  02/19/1986  SUBJECTIVE:  Patient came in with abdominal pain and vomiting.  He is feeling better today.  Sugars are improving. He reports he manages his diabetes on his own.  He does not have a follow-up appointment because he cannot afford it  REVIEW OF SYSTEMS:   Review of Systems  Constitutional: Negative for chills, fever and weight loss.  HENT: Negative for ear discharge, ear pain and nosebleeds.   Eyes: Negative for blurred vision, pain and discharge.  Respiratory: Negative for sputum production, shortness of breath, wheezing and stridor.   Cardiovascular: Negative for chest pain, palpitations, orthopnea and PND.  Gastrointestinal: Negative for abdominal pain, diarrhea, nausea and vomiting.  Genitourinary: Negative for frequency and urgency.  Musculoskeletal: Negative for back pain and joint pain.  Neurological: Positive for weakness. Negative for sensory change, speech change and focal weakness.  Psychiatric/Behavioral: Negative for depression and hallucinations. The patient is not nervous/anxious.    Tolerating Diet:yes Tolerating PT: ambulatory  DRUG ALLERGIES:   Allergies  Allergen Reactions  . Keflex [Cephalexin] Anaphylaxis  . Sulfa Antibiotics Anaphylaxis    VITALS:  Blood pressure (!) 135/91, pulse 77, temperature 98.7 F (37.1 C), temperature source Oral, resp. rate 18, height 5\' 6"  (1.676 m), weight 49.9 kg (110 lb), SpO2 98 %.  PHYSICAL EXAMINATION:   Physical Exam  GENERAL:  32 y.o.-year-old patient lying in the bed with no acute distress.  EYES: Pupils equal, round, reactive to light and accommodation. No scleral icterus. Extraocular muscles intact.  HEENT: Head atraumatic, normocephalic. Oropharynx and nasopharynx clear.  NECK:  Supple, no jugular venous distention. No thyroid enlargement, no tenderness.  LUNGS: Normal  breath sounds bilaterally, no wheezing, rales, rhonchi. No use of accessory muscles of respiration.  CARDIOVASCULAR: S1, S2 normal. No murmurs, rubs, or gallops.  ABDOMEN: Soft, nontender, nondistended. Bowel sounds present. No organomegaly or mass.  EXTREMITIES: No cyanosis, clubbing or edema b/l.    NEUROLOGIC: Cranial nerves II through XII are intact. No focal Motor or sensory deficits b/l.   PSYCHIATRIC:  patient is alert and oriented x 3.  SKIN: No obvious rash, lesion, or ulcer.   LABORATORY PANEL:  CBC Recent Labs  Lab 07/14/17 0539  WBC 7.5  HGB 10.7*  HCT 31.1*  PLT 236    Chemistries  Recent Labs  Lab 07/14/17 0539  NA 138  K 3.7  CL 102  CO2 27  GLUCOSE 230*  BUN 13  CREATININE 1.13  CALCIUM 8.7*  MG 1.6*  AST 19  ALT 19  ALKPHOS 64  BILITOT 1.0   Cardiac Enzymes Recent Labs  Lab 07/13/17 1423  TROPONINI <0.03   RADIOLOGY:  Dg Chest 1 View  Result Date: 07/13/2017 CLINICAL DATA:  Hyperglycemia and vomiting. EXAM: CHEST 1 VIEW COMPARISON:  Chest x-ray dated April 17, 2006. FINDINGS: The heart size and mediastinal contours are within normal limits. Both lungs are clear. The visualized skeletal structures are unremarkable. IMPRESSION: No active disease. Electronically Signed   By: Titus Dubin M.D.   On: 07/13/2017 14:50   Ct Abdomen Pelvis W Contrast  Result Date: 07/13/2017 CLINICAL DATA:  Hyperglycemia with vomiting since yesterday. Mid abdominal pain with nausea, vomiting and diarrhea. Diabetes. EXAM: CT ABDOMEN AND PELVIS WITH CONTRAST TECHNIQUE: Multidetector CT imaging of the abdomen and pelvis was performed using the standard protocol following bolus administration of intravenous  contrast. CONTRAST:  61mL ISOVUE-300 IOPAMIDOL (ISOVUE-300) INJECTION 61% COMPARISON:  CT 03/30/2007 and 05/07/2011. FINDINGS: Lower chest: Clear lung bases. No significant pleural effusion. There is a moderate size hiatal hernia with mild distal esophageal wall  thickening. Hepatobiliary: The liver is normal in density without focal abnormality. No evidence of gallstones, gallbladder wall thickening or biliary dilatation. Pancreas: Unremarkable. No pancreatic ductal dilatation or surrounding inflammatory changes. Spleen: Normal in size without focal abnormality. Adrenals/Urinary Tract: Both adrenal glands appear normal. The kidneys appear normal without evidence of urinary tract calculus, suspicious lesion or hydronephrosis. No bladder abnormalities are seen. The bladder is distended. Stomach/Bowel: No evidence of bowel wall thickening, distention or surrounding inflammatory change. As above, moderate-size hiatal hernia with distal esophageal wall thickening. The appendix appears normal. Vascular/Lymphatic: There are no enlarged abdominal or pelvic lymph nodes. No atherosclerosis or acute vascular findings seen. Reproductive: The prostate gland and seminal vesicles appear unremarkable. Other: There is new swirling of the left mesenteric vessels, best seen on sagittal images 64 through 78. No discrete internal hernia, bowel wall thickening or vascular occlusion identified in this area. Patient has lost significant mesenteric fat since the previous study. There is mild edema throughout the subcutaneous and deep abdominal fat. Musculoskeletal: No acute or significant osseous findings. IMPRESSION: 1. New swirling of the mesenteric vessels in the left mid abdomen without evidence of bowel obstruction, volvulus, vascular occlusion or surrounding inflammation. This finding could be related to interval weight loss, although suggests the possibility of an internal hernia which is not definitely demonstrated. 2. Moderate size hiatal hernia with mild distal esophageal wall thickening. 3. The appendix appears normal. 4. Distended bladder. Electronically Signed   By: Richardean Sale M.D.   On: 07/13/2017 20:00   Dg Abd Portable 2v  Result Date: 07/14/2017 CLINICAL DATA:  Abdominal  pain EXAM: PORTABLE ABDOMEN - 2 VIEW COMPARISON:  None. FINDINGS: The bowel gas pattern is normal. There is no evidence of free air. No radio-opaque calculi or other significant radiographic abnormality is seen. There is excreted contrast in the urinary bladder. IMPRESSION: Normal abdominal radiographs. Electronically Signed   By: Ulyses Jarred M.D.   On: 07/14/2017 06:25   Dg Duanne Limerick W/small Bowel  Result Date: 07/14/2017 CLINICAL DATA:  Abdominal pain. EXAM: UPPER GI SERIES WITH SMALL BOWEL FOLLOW-THROUGH FLUOROSCOPY TIME:  Fluoroscopy Time:  5 minutes 6 seconds Radiation Exposure Index (if provided by the fluoroscopic device): 235.2 mGy Number of Acquired Spot Images: 32 TECHNIQUE: Combined single contrast upper GI series using thin barium. Subsequently, serial images of the small bowel were obtained including spot views of the terminal ileum. COMPARISON:  CT 07/13/2016. FINDINGS: Scout film reveals no focal abnormality. Standard upper GI small-bowel follow-through performed. The esophagus is widely patent. No focal esophageal abnormality identified. Gastric fold thickening noted particular in the region the fundus. Gastritis could present this fashion. Other etiologies of gastric fold thickening including infiltrative etiologies cannot be completely excluded. Small bowel appears normal. Normal small-bowel full caliber and thickness. No obstructing lesion identified. Normal transit time. Terminal ileum appears normal. IMPRESSION: 1. Thickening of the gastric folds most prominently in the fundus. Findings are most consistent with gastritis. 2. Small-bowel follow-through is normal. No obstructing or focal small bowel lesions identified. Terminal ileum appears normal. Electronically Signed   By: Marcello Moores  Register   On: 07/14/2017 16:19   ASSESSMENT AND PLAN:   Jaydee Conran  is a 32 y.o. male who presents with abdominal pain and nausea and vomiting.  Here in the  ED he was found to have a glucose of greater than  600 initially, which corrected significantly with IV hydration as well as some subcu insulin.  Is currently in the 300s.  *Abdominal pain -possible internal hernia as evidenced by CT scan.  Surgical team following along his primary team.   -Patient has started on clear liquid diet tolerating well.  - He does not have much abdominal pain. -Upper GI with barium swallow shows gastritis -Start patient on PPI  *Uncontrolled  Diabetes ([place])  -on Levemir 8 units twice daily and high-dose sliding scale insulin.--Sugars well controlled -Patient [facility]  He takes over-the-counter insulin and manages his diabetes by himself. -addressed the importance of getting yearly eye exam, feet exam and A1c drawn and insulin adjusted.  Patient voiced understanding. _Patient can go back home on his insulin 70/30 10 units in the morning and 15 units at bedtime  * GERD start Protonix  D/w dr Adonis Huguenin    CODE STATUS: Full  DVT Prophylaxis: lovenox  TOTAL TIME TAKING CARE OF THIS PATIENT: *30* minutes.  >50% time spent on counselling and coordination of care  Note: This dictation was prepared with Dragon dictation along with smaller phrase technology. Any transcriptional errors that result from this process are unintentional.  Fritzi Mandes M.D on 07/15/2017 at 9:11 AM  Between 7am to 6pm - Pager - [phone]  After 6pm go to [email or web address] - password [email or web address]  Office  [phone]  CC: Primary care physician; Patient, No Pcp PerPatient ID: Shawn [name], male   DOB: [birth year], 31 y.o.   MRN: [id number]

## 2017-07-15 NOTE — Progress Notes (Signed)
Inpatient Diabetes Program Recommendations  AACE/ADA: New Consensus Statement on Inpatient Glycemic Control (2015)  Target Ranges:  Prepandial:   less than 140 mg/dL      Peak postprandial:   less than 180 mg/dL (1-2 hours)      Critically ill patients:  140 - 180 mg/dL   Lab Results  Component Value Date   GLUCAP 206 (H) 07/15/2017   HGBA1C 16.1 (H) 07/13/2017    Met with patient to discuss the principals of 70/30 insulin and the importance of checking blood sugars pre-meals and at bedtime.  Patient has been taking insulin inappropriately, not taking it in connection with eating a meal.   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  He tells me he eats supper most days at 5pm but does not take insulin until 8-9pm.    Because of his shift work, 70/30 insulin is not the ideal choice for this patient but for now, we are going to get him to start taking the 70/30 regularly; 30 minutes before breakfast and 30 minutes before supper.  I have asked him to check his sugar 4 x/day (case management is getting him a meter and strips) before meals and before bed and to write them down and then take it to [facility] so the MD can assess his needs.  Strongly encouraged to discuss the option of switching to long acting basal and short act meal time insulin when he meets the [facility] MD.  I have written the timing of insulin and the blood sugar testing times down for him.  His grandparents were here yesterday and were very supportive- I have encouraged Shawn [name] to ask his grandparents to help him stay accountable to taking his blood sugars and insulin and taking care of himself.   Gentry Fitz, RN, BA, MHA, CDE Diabetes Coordinator Inpatient Diabetes Program  [phone] (Team Pager) [phone] ([place]) 07/15/2017 12:09 PM

## 2017-07-15 NOTE — Progress Notes (Signed)
Discharge order received. Patient is alert and oriented. Vital signs stable . No signs of acute distress. Discharge instructions given. Patient verbalized understanding. No other issues noted at this time.   

## 2017-07-15 NOTE — Progress Notes (Addendum)
Inpatient Diabetes Program Recommendations  AACE/ADA: New Consensus Statement on Inpatient Glycemic Control (2015)  Target Ranges:  Prepandial:   less than 140 mg/dL      Peak postprandial:   less than 180 mg/dL (1-2 hours)      Critically ill patients:  140 - 180 mg/dL   Lab Results  Component Value Date   GLUCAP 88 07/15/2017   HGBA1C 16.1 (H) 07/13/2017    Review of Glycemic Control  Results for HERON, PITCOCK (MRN [id number]) as of 07/15/2017 08:06  Ref. Range 07/14/2017 11:55 07/14/2017 17:05 07/14/2017 21:28 07/15/2017 01:12 07/15/2017 04:57  Glucose-Capillary Latest Ref Range: 65 - 99 mg/dL 253 (H) 162 (H) 143 (H) 119 (H) 88    Diabetes history: Type 1 Outpatient Diabetes medications: Novolin 70/30 10 units qam,  Novolin 70/30 15 units qpm  Current orders for Inpatient glycemic control: Novolog 0-20 units tid, Levemir 8 units bid   Inpatient Diabetes Program Recommendations:  Please change Novolog correction scale to sensitive (0-9 units tid)- he has little insulin resistance and he only weighs 110lbs. With fasting blood sugars now ideal, he will need less "correction" insulin.   Once he is tolerating a diet,  add Novolog 2 units tid (hold if he eats less than 50%) and continue Novolog 0-9 units tid.   Gentry Fitz, RN, BA, MHA, CDE Diabetes Coordinator Inpatient Diabetes Program  [phone] (Team Pager) [phone] ([place]) 07/15/2017 8:09 AM

## 2017-07-16 LAB — HIV ANTIBODY (ROUTINE TESTING W REFLEX): HIV Screen 4th Generation wRfx: NONREACTIVE

## 2017-07-19 ENCOUNTER — Ambulatory Visit: Payer: Self-pay | Admitting: Surgery

## 2017-07-20 ENCOUNTER — Encounter: Payer: Self-pay | Admitting: Emergency Medicine

## 2017-07-20 ENCOUNTER — Emergency Department
Admission: EM | Admit: 2017-07-20 | Discharge: 2017-07-20 | Disposition: A | Discharge status: Home / Self Care | Payer: Self-pay | Attending: Emergency Medicine | Admitting: Emergency Medicine

## 2017-07-20 ENCOUNTER — Emergency Department: Payer: Self-pay

## 2017-07-20 DIAGNOSIS — R600 Localized edema: Secondary | ICD-10-CM | POA: Insufficient documentation

## 2017-07-20 DIAGNOSIS — Z794 Long term (current) use of insulin: Secondary | ICD-10-CM | POA: Insufficient documentation

## 2017-07-20 DIAGNOSIS — E119 Type 2 diabetes mellitus without complications: Secondary | ICD-10-CM | POA: Insufficient documentation

## 2017-07-20 LAB — BASIC METABOLIC PANEL
Anion gap: 8 (ref 5–15)
Anion gap: 8 (ref 5–15)
BUN: 32 mg/dL — ABNORMAL HIGH (ref 6–20)
BUN: 29 mg/dL — ABNORMAL HIGH (ref 6–20)
CALCIUM: 8.7 mg/dL — AB (ref 8.9–10.3)
CO2: 27 mmol/L (ref 22–32)
CO2: 26 mmol/L (ref 22–32)
CREATININE: 1.16 mg/dL (ref 0.61–1.24)
Calcium: 8.9 mg/dL (ref 8.9–10.3)
Chloride: 92 mmol/L — ABNORMAL LOW (ref 101–111)
Chloride: 100 mmol/L — ABNORMAL LOW (ref 101–111)
Creatinine, Ser: 1.37 mg/dL — ABNORMAL HIGH (ref 0.61–1.24)
GFR calc non Af Amer: >60 mL/min (ref >60)
GLUCOSE: 552 mg/dL — AB (ref 65–99)
Glucose, Bld: 215 mg/dL — ABNORMAL HIGH (ref 65–99)
POTASSIUM: 4.8 mmol/L (ref 3.5–5.1)
Potassium: 4.1 mmol/L (ref 3.5–5.1)
SODIUM: 134 mmol/L — AB (ref 135–145)
Sodium: 127 mmol/L — ABNORMAL LOW (ref 135–145)

## 2017-07-20 LAB — HEPATIC FUNCTION PANEL
ALBUMIN: 3.6 g/dL (ref 3.5–5.0)
ALK PHOS: 59 U/L (ref 38–126)
ALT: 22 U/L (ref 17–63)
AST: 17 U/L (ref 15–41)
Bilirubin, Direct: <0.1 mg/dL — ABNORMAL LOW (ref 0.1–0.5)
TOTAL PROTEIN: 6.4 g/dL — AB (ref 6.5–8.1)
Total Bilirubin: 0.4 mg/dL (ref 0.3–1.2)

## 2017-07-20 LAB — URINALYSIS, COMPLETE (UACMP) WITH MICROSCOPIC
BACTERIA UA: NONE SEEN
BILIRUBIN URINE: NEGATIVE
Glucose, UA: >=500 mg/dL — AB
KETONES UR: NEGATIVE mg/dL
Leukocytes, UA: NEGATIVE
NITRITE: NEGATIVE
PROTEIN: 30 mg/dL — AB
SPECIFIC GRAVITY, URINE: 1.02 (ref 1.005–1.030)
pH: 6 (ref 5.0–8.0)

## 2017-07-20 LAB — CBC
HEMATOCRIT: 29.5 % — AB (ref 40.0–52.0)
Hemoglobin: 10 g/dL — ABNORMAL LOW (ref 13.0–18.0)
MCH: 29.5 pg (ref 26.0–34.0)
MCHC: 33.9 g/dL (ref 32.0–36.0)
MCV: 87 fL (ref 80.0–100.0)
Platelets: 213 10*3/uL (ref 150–440)
RBC: 3.4 MIL/uL — AB (ref 4.40–5.90)
RDW: 12.7 % (ref 11.5–14.5)
WBC: 9 10*3/uL (ref 3.8–10.6)

## 2017-07-20 LAB — GLUCOSE, CAPILLARY
GLUCOSE-CAPILLARY: 468 mg/dL — AB (ref 65–99)
GLUCOSE-CAPILLARY: 219 mg/dL — AB (ref 65–99)

## 2017-07-20 LAB — BRAIN NATRIURETIC PEPTIDE: B Natriuretic Peptide: 87 pg/mL (ref 0.0–100.0)

## 2017-07-20 MED ORDER — ACETAMINOPHEN 500 MG PO TABS
1000.0000 mg | ORAL_TABLET | ORAL | Status: AC
  Administered 2017-07-20: 1000 mg via ORAL

## 2017-07-20 MED ORDER — OXYCODONE HCL 5 MG PO TABS
5.0000 mg | ORAL_TABLET | ORAL | Status: AC
  Administered 2017-07-20: 5 mg via ORAL
  Filled 2017-07-20: qty 1

## 2017-07-20 MED ORDER — OXYCODONE-ACETAMINOPHEN 5-325 MG PO TABS: 1.0000 | ORAL_TABLET | Freq: Four times a day (QID) | ORAL | 0 refills | Status: DC | PRN

## 2017-07-20 MED ORDER — ACETAMINOPHEN 500 MG PO TABS
ORAL_TABLET | ORAL | Status: AC
  Administered 2017-07-20: 1000 mg via ORAL
  Filled 2017-07-20: qty 2

## 2017-07-20 MED ORDER — FUROSEMIDE 20 MG PO TABS: 20.0000 mg | ORAL_TABLET | Freq: Every day | ORAL | 0 refills | Status: DC | PRN

## 2017-07-20 MED ORDER — FUROSEMIDE 10 MG/ML IJ SOLN
20.0000 mg | Freq: Once | INTRAMUSCULAR | Status: AC
  Administered 2017-07-20: 20 mg via INTRAVENOUS
  Filled 2017-07-20: qty 4

## 2017-07-20 MED ORDER — SODIUM CHLORIDE 0.9 % IV BOLUS (SEPSIS)
500.0000 mL | Freq: Once | INTRAVENOUS | Status: AC
  Administered 2017-07-20: 500 mL via INTRAVENOUS

## 2017-07-20 MED ORDER — INSULIN ASPART 100 UNIT/ML ~~LOC~~ SOLN
10.0000 [IU] | Freq: Once | SUBCUTANEOUS | Status: AC
Start: 2017-07-20 — End: 2017-07-20
  Administered 2017-07-20: 10 [IU] via INTRAVENOUS
  Filled 2017-07-20: qty 1

## 2017-07-20 NOTE — ED Provider Notes (Signed)
[facility] Emergency Department Provider Note  ____________________________________________  Time seen: Approximately 10:33 AM  I have reviewed the triage vital signs and the nursing notes.   HISTORY  Chief Complaint Leg Swelling   HPI Shawn [name] is a 32 y.o. male a history of type 1 diabetes who presents for evaluation of bilateral leg pain and swelling. Patient was discharged from the hospital 5 days ago after being admitted for DKA for which he received a lot of fluids. A day after going home patient started noticed swelling in his bilateral lower extremities which has gotten progressively worse. He reports pain that is pressure-like, located on bilateral lower extremities, constant and nonradiating. The pain is moderate in intensity at this time. No chest pain or shortness of breath. No history of heart failure, no personal or family history of blood clots, no hemoptysis. Patient was on Lovenox shots during his hospitalization.  Past Medical History:  Diagnosis Date  . Diabetes mellitus without complication ([place])   . GERD (gastroesophageal reflux disease)   . IBS (irritable bowel syndrome)     Patient Active Problem List   Diagnosis Date Noted  . History of migraine 07/15/2017  . IBS (irritable bowel syndrome) 07/15/2017  . Protein-calorie malnutrition, severe 07/14/2017  . Abdominal pain 07/13/2017  . GERD (gastroesophageal reflux disease) 12/30/2009  . Diabetes ([place]) 11/25/2009  . MDD (major depressive disorder) 11/25/2009  . Hypercholesterolemia 03/07/2008    History reviewed. No pertinent surgical history.  Prior to Admission medications   Medication Sig Start Date End Date Taking? Authorizing Provider  insulin NPH-regular Human (NOVOLIN 70/30) (70-30) 100 UNIT/ML injection 15 units every morning and 10 units at bedtime 09/06/07  Yes [provider]  furosemide (LASIX) 20 MG tablet Take 1 tablet (20 mg total) by mouth daily as  needed for up to 7 days. 07/20/17 07/27/17  Rudene Re, MD  ondansetron (ZOFRAN-ODT) 4 MG disintegrating tablet Take 1 tablet (4 mg total) by mouth every 6 (six) hours as needed for nausea. 07/15/17   Clayburn Pert, MD  oxyCODONE-acetaminophen (ROXICET) 5-325 MG tablet Take 1 tablet by mouth every 6 (six) hours as needed. 07/20/17 07/20/18  Rudene Re, MD    Allergies Banana; Keflex [cephalexin]; Onion; Sulfa antibiotics; and Grapeseed extract [nutritional supplements]  Family History  Problem Relation Age of Onset  . Diabetes Mother   . Ovarian cancer Mother     Social History Social History   Tobacco Use  . Smoking status: Never Smoker  . Smokeless tobacco: Never Used  Substance Use Topics  . Alcohol use: No    Frequency: Never  . Drug use: No    Review of Systems  Constitutional: Negative for fever. Eyes: Negative for visual changes. ENT: Negative for sore throat. Neck: No neck pain  Cardiovascular: Negative for chest pain. Respiratory: Negative for shortness of breath. Gastrointestinal: Negative for abdominal pain, vomiting or diarrhea. Genitourinary: Negative for dysuria. Musculoskeletal: Negative for back pain. + b/l leg pain and swelling Skin: Negative for rash. Neurological: Negative for headaches, weakness or numbness. Psych: No SI or HI  ____________________________________________   PHYSICAL EXAM:  VITAL SIGNS: ED Triage Vitals [07/20/17 0918]  Enc Vitals Group     BP 115/84     Pulse Rate 82     Resp 16     Temp 98.1 F (36.7 C)     Temp Source Oral     SpO2 99 %     Weight 110 lb (49.9 kg)  Height 5\' 6"  (1.676 m)     Head Circumference      Peak Flow      Pain Score 10     Pain Loc      Pain Edu?      Excl. in [place]?     Constitutional: Alert and oriented. Well appearing and in no apparent distress. HEENT:      Head: Normocephalic and atraumatic.         Eyes: Conjunctivae are normal. Sclera is non-icteric.        Mouth/Throat: Mucous membranes are moist.       Neck: Supple with no signs of meningismus. Cardiovascular: Regular rate and rhythm. No murmurs, gallops, or rubs. 2+ symmetrical distal pulses are present in all extremities. No JVD. Respiratory: Normal respiratory effort. Lungs are clear to auscultation bilaterally. No wheezes, crackles, or rhonchi.  Gastrointestinal: Soft, non tender, and non distended with positive bowel sounds. No rebound or guarding. Musculoskeletal: 2+ pitting edema bilateral lower extremities up to his knees  Neurologic: Normal speech and language. Face is symmetric. Moving all extremities. No gross focal neurologic deficits are appreciated. Skin: Skin is warm, dry and intact. No rash noted. Psychiatric: Mood and affect are normal. Speech and behavior are normal.  ____________________________________________   LABS (all labs ordered are listed, but only abnormal results are displayed)  Labs Reviewed  BASIC METABOLIC PANEL - Abnormal; Notable for the following components:      Result Value   Sodium 127 (*)    Chloride 92 (*)    Glucose, Bld 552 (*)    BUN 32 (*)    Creatinine, Ser 1.37 (*)    All other components within normal limits  CBC - Abnormal; Notable for the following components:   RBC 3.40 (*)    Hemoglobin 10.0 (*)    HCT 29.5 (*)    All other components within normal limits  URINALYSIS, COMPLETE (UACMP) WITH MICROSCOPIC - Abnormal; Notable for the following components:   Color, Urine STRAW (*)    APPearance CLEAR (*)    Glucose, UA >=500 (*)    Hgb urine dipstick SMALL (*)    Protein, ur 30 (*)    Squamous Epithelial / LPF 0-5 (*)    All other components within normal limits  HEPATIC FUNCTION PANEL - Abnormal; Notable for the following components:   Total Protein 6.4 (*)    Bilirubin, Direct <0.1 (*)    All other components within normal limits  GLUCOSE, CAPILLARY - Abnormal; Notable for the following components:   Glucose-Capillary 468 (*)     All other components within normal limits  GLUCOSE, CAPILLARY - Abnormal; Notable for the following components:   Glucose-Capillary 219 (*)    All other components within normal limits  BASIC METABOLIC PANEL - Abnormal; Notable for the following components:   Sodium 134 (*)    Chloride 100 (*)    Glucose, Bld 215 (*)    BUN 29 (*)    Calcium 8.7 (*)    All other components within normal limits  BRAIN NATRIURETIC PEPTIDE  CBG MONITORING, ED  CBG MONITORING, ED   ____________________________________________  EKG  none ____________________________________________  RADIOLOGY  Doppler: Negative ____________________________________________   PROCEDURES  Procedure(s) performed: None Procedures Critical Care performed:  None ____________________________________________   INITIAL IMPRESSION / ASSESSMENT AND PLAN / ED COURSE   32 y.o. male a history of type 1 diabetes who presents for evaluation of bilateral leg pain and swelling x4 days. Patient with  a recent admission for DKA for which he received a lot of IV fluids. He does have 2+ pitting edema bilateral lower extremities symmetrically. Lungs are clear with no crackles. Patient has no chest pain. No history of DVT and patient was on Lovenox shots during his admission to the hospital. Ddx edema due to large amount of IVF vs DVT. NO evidence of cellulitis. Doppler of b/l LE. Labs showing hyperglycemia with BG 552, normal CO2, normal AG, and no ketonuria, no evidence of DKA. Na is low but normal when corrected based on glucose. Will give 500cc bolus and 10U IV insulin/  Clinical Course as of Jul 20 1454  Wed Jul 20, 2017  1236 Doppler studies negative for blood clot. Normal BNP. Sugars trending down. Pain is well controlled with Tylenol and oxycodone. We'll give 20 mg of IV Lasix for diuresis and repeat BMP.  [CV]    Clinical Course User Index [CV] Alfred Levins, [place], MD   _________________________ 1:34 PM on  07/20/2017 -----------------------------------------  Repeat BMP improved with a blood glucose of 215, normal sodium, improved creatinine. No evidence of DKA. Patient is tolerating by mouth. At this time I will discharge patient on Lasix 20 mg daily when necessary for leg swelling, recommend elevating his legs and compression stockings. I believe the swelling is due to excessive IV fluids which were required to fix his DKA. Discussed that he needs to return to the emergency room if he develops chest pain or shortness of breath, or if his sugars are not well controlled at home. Discussed close follow-up with primary care doctor or return to the emergency room if the swelling has not resolved within 7 days or fix getting worse on Lasix.  As part of my medical decision making, I reviewed the following data within the [facility] notes reviewed and incorporated, Labs reviewed , Old chart reviewed, Radiograph reviewed , Notes from prior ED visits and [place] Controlled Substance Database    Pertinent labs & imaging results that were available during my care of the patient were reviewed by me and considered in my medical decision making (see chart for details).    ____________________________________________   FINAL CLINICAL IMPRESSION(S) / ED DIAGNOSES  Final diagnoses:  Bilateral lower extremity edema      NEW MEDICATIONS STARTED DURING THIS VISIT:  ED Discharge Orders        Ordered    furosemide (LASIX) 20 MG tablet  Daily PRN     07/20/17 1329    oxyCODONE-acetaminophen (ROXICET) 5-325 MG tablet  Every 6 hours PRN     07/20/17 1329       Note:  This document was prepared using Dragon voice recognition software and may include unintentional dictation errors.    Rudene Re, MD 07/20/17 [phone]

## 2017-07-20 NOTE — ED Notes (Signed)
Patient complaining of swelling and pain in both legs.  Hospitalized last week for gastroparesis.  Insulin dependent diabetic, reports his BS was 237, ate and took his insulin this AM.

## 2017-07-20 NOTE — Discharge Instructions (Signed)
Take 1 lasix pill a day as need for swelling. Once swelling resolves, please stop taking this medication. Take percocet as needed for pain as prescribed. Follow up with your doctor in 2 days. Return to the emergency room if the swelling is not improving, she developed shortness of breath or chest pain, or if your sugars are not well controlled.

## 2017-07-20 NOTE — ED Notes (Signed)
Lab called critical results - BS 552. Charge Nurse notified.  Patient to room 15.

## 2017-07-20 NOTE — ED Triage Notes (Signed)
Pt to ED with c/o of bilateral leg swelling that started on Sunday.

## 2017-07-21 ENCOUNTER — Telehealth: Payer: Self-pay | Admitting: General Practice

## 2017-07-21 NOTE — Telephone Encounter (Signed)
Left a message for the patient to call the office, patient no showed appointment on 07/19/17. Please r/s if patient calls back.

## 2017-07-26 ENCOUNTER — Encounter: Payer: Self-pay | Admitting: General Practice

## 2017-07-26 NOTE — Telephone Encounter (Signed)
Left another message for the patient to call the office, also mailed a letter to the patient to contact our office to r/s

## 2017-10-04 ENCOUNTER — Emergency Department
Admission: EM | Admit: 2017-10-04 | Discharge: 2017-10-04 | Disposition: A | Discharge status: Home / Self Care | Payer: Self-pay | Attending: Emergency Medicine | Admitting: Emergency Medicine

## 2017-10-04 ENCOUNTER — Encounter: Payer: Self-pay | Admitting: Medical Oncology

## 2017-10-04 DIAGNOSIS — E1165 Type 2 diabetes mellitus with hyperglycemia: Secondary | ICD-10-CM | POA: Insufficient documentation

## 2017-10-04 DIAGNOSIS — R609 Edema, unspecified: Secondary | ICD-10-CM

## 2017-10-04 DIAGNOSIS — F329 Major depressive disorder, single episode, unspecified: Secondary | ICD-10-CM | POA: Insufficient documentation

## 2017-10-04 DIAGNOSIS — R739 Hyperglycemia, unspecified: Secondary | ICD-10-CM

## 2017-10-04 DIAGNOSIS — M79604 Pain in right leg: Secondary | ICD-10-CM | POA: Insufficient documentation

## 2017-10-04 DIAGNOSIS — Z794 Long term (current) use of insulin: Secondary | ICD-10-CM | POA: Insufficient documentation

## 2017-10-04 DIAGNOSIS — M79605 Pain in left leg: Secondary | ICD-10-CM | POA: Insufficient documentation

## 2017-10-04 LAB — GLUCOSE, CAPILLARY
GLUCOSE-CAPILLARY: 557 mg/dL — AB (ref 65–99)
Glucose-Capillary: 385 mg/dL — ABNORMAL HIGH (ref 65–99)

## 2017-10-04 LAB — CBC WITH DIFFERENTIAL/PLATELET
BASOS PCT: 1 %
Basophils Absolute: 0.1 10*3/uL (ref 0–0.1)
EOS ABS: 0.1 10*3/uL (ref 0–0.7)
Eosinophils Relative: 1 %
HCT: 32.5 % — ABNORMAL LOW (ref 40.0–52.0)
Hemoglobin: 11.4 g/dL — ABNORMAL LOW (ref 13.0–18.0)
Lymphocytes Relative: 23 %
Lymphs Abs: 1.8 10*3/uL (ref 1.0–3.6)
MCH: 28.4 pg (ref 26.0–34.0)
MCHC: 35 g/dL (ref 32.0–36.0)
MCV: 81.1 fL (ref 80.0–100.0)
MONO ABS: 0.8 10*3/uL (ref 0.2–1.0)
MONOS PCT: 10 %
Neutro Abs: 5 10*3/uL (ref 1.4–6.5)
Neutrophils Relative %: 65 %
Platelets: 228 10*3/uL (ref 150–440)
RBC: 4 MIL/uL — ABNORMAL LOW (ref 4.40–5.90)
RDW: 12.9 % (ref 11.5–14.5)
WBC: 7.7 10*3/uL (ref 3.8–10.6)

## 2017-10-04 LAB — BASIC METABOLIC PANEL
Anion gap: 8 (ref 5–15)
BUN: 20 mg/dL (ref 6–20)
CO2: 28 mmol/L (ref 22–32)
CREATININE: 1.33 mg/dL — AB (ref 0.61–1.24)
Calcium: 9.1 mg/dL (ref 8.9–10.3)
Chloride: 94 mmol/L — ABNORMAL LOW (ref 101–111)
GFR calc non Af Amer: >60 mL/min (ref >60)
Glucose, Bld: 487 mg/dL — ABNORMAL HIGH (ref 65–99)
Potassium: 3.7 mmol/L (ref 3.5–5.1)
SODIUM: 130 mmol/L — AB (ref 135–145)

## 2017-10-04 LAB — TROPONIN I

## 2017-10-04 MED ORDER — SODIUM CHLORIDE 0.9 % IV BOLUS
500.0000 mL | Freq: Once | INTRAVENOUS | Status: AC
  Administered 2017-10-04: 500 mL via INTRAVENOUS

## 2017-10-04 MED ORDER — INSULIN ASPART 100 UNIT/ML ~~LOC~~ SOLN
8.0000 [IU] | Freq: Once | SUBCUTANEOUS | Status: AC
  Administered 2017-10-04: 8 [IU] via INTRAVENOUS
  Filled 2017-10-04: qty 1

## 2017-10-04 MED ORDER — ONDANSETRON HCL 4 MG/2ML IJ SOLN
4.0000 mg | Freq: Once | INTRAMUSCULAR | Status: AC
Start: 2017-10-04 — End: 2017-10-04
  Administered 2017-10-04: 4 mg via INTRAVENOUS
  Filled 2017-10-04: qty 2

## 2017-10-04 MED ORDER — MORPHINE SULFATE (PF) 4 MG/ML IV SOLN
4.0000 mg | Freq: Once | INTRAVENOUS | Status: AC
  Administered 2017-10-04: 4 mg via INTRAVENOUS
  Filled 2017-10-04: qty 1

## 2017-10-04 MED ORDER — INSULIN ASPART 100 UNIT/ML ~~LOC~~ SOLN
10.0000 [IU] | Freq: Once | SUBCUTANEOUS | Status: AC
  Administered 2017-10-04: 10 [IU] via INTRAVENOUS

## 2017-10-04 MED ORDER — INSULIN ASPART 100 UNIT/ML ~~LOC~~ SOLN
SUBCUTANEOUS | Status: AC
  Administered 2017-10-04: 10 [IU] via INTRAVENOUS
  Filled 2017-10-04: qty 1

## 2017-10-04 NOTE — Discharge Instructions (Signed)
Please call the number provided for cardiology to arrange a follow-up apartment to discuss your peripheral edema and leg pain further.  Return to the emergency department for any chest pain, trouble breathing, or any other symptom personally concerning to yourself.

## 2017-10-04 NOTE — ED Notes (Signed)
Pt ambulatory to and from toilet. States leg swelling x 4 days. Has bottle of lasix PRN. No pitting noted. No weeping or open wounds noted. Pt tearful stating that both legs hurt from calves down to feet. Alert, oriented.

## 2017-10-04 NOTE — ED Provider Notes (Signed)
[facility] Emergency Department Provider Note  Time seen: 6:03 PM  I have reviewed the triage vital signs and the nursing notes.   HISTORY  Chief Complaint Leg Swelling    HPI Shawn [name] is a 32 y.o. male with a past medical history of diabetes, gastric reflux, IBS, presents to the emergency department for bilateral leg pain and swelling.  According to the patient since January he has been experiencing leg swelling, states he had a recent echocardiogram of his heart that was normal.  Patient was prescribed Lasix which she has been taking for his leg swelling.  States the pain in his legs are worse than the swelling he believes has worsened.  Patient also states he did not take his insulin today because he forgot.  Patient denies any fever, abdominal pain, chest pain or diarrhea.  States occasional nausea with intermittent vomiting over the last several days.  States mild cough as well.  Largely negative review of systems otherwise.   Past Medical History:  Diagnosis Date  . Diabetes mellitus without complication ([place])   . GERD (gastroesophageal reflux disease)   . IBS (irritable bowel syndrome)     Patient Active Problem List   Diagnosis Date Noted  . History of migraine 07/15/2017  . IBS (irritable bowel syndrome) 07/15/2017  . Protein-calorie malnutrition, severe 07/14/2017  . Abdominal pain 07/13/2017  . GERD (gastroesophageal reflux disease) 12/30/2009  . Diabetes ([place]) 11/25/2009  . MDD (major depressive disorder) 11/25/2009  . Hypercholesterolemia 03/07/2008    History reviewed. No pertinent surgical history.  Prior to Admission medications   Medication Sig Start Date End Date Taking? Authorizing Provider  furosemide (LASIX) 20 MG tablet Take 1 tablet (20 mg total) by mouth daily as needed for up to 7 days. 07/20/17 10/04/17 Yes Alfred Levins, [place], MD  insulin NPH-regular Human (NOVOLIN 70/30) (70-30) 100 UNIT/ML injection 15 units every  morning and 10 units at bedtime 09/06/07  Yes [provider]  ondansetron (ZOFRAN-ODT) 4 MG disintegrating tablet Take 1 tablet (4 mg total) by mouth every 6 (six) hours as needed for nausea. 07/15/17   Clayburn Pert, MD  oxyCODONE-acetaminophen (ROXICET) 5-325 MG tablet Take 1 tablet by mouth every 6 (six) hours as needed. Patient not taking: Reported on 10/04/2017 07/20/17 07/20/18  Rudene Re, MD    Allergies  Allergen Reactions  . Banana Hives, Nausea And Vomiting and Rash  . Keflex [Cephalexin] Anaphylaxis  . Onion Hives, Nausea And Vomiting and Rash  . Sulfa Antibiotics Anaphylaxis  . Grapeseed Extract [Nutritional Supplements] Itching    Family History  Problem Relation Age of Onset  . Diabetes Mother   . Ovarian cancer Mother     Social History Social History   Tobacco Use  . Smoking status: Never Smoker  . Smokeless tobacco: Never Used  Substance Use Topics  . Alcohol use: No    Frequency: Never  . Drug use: No    Review of Systems Constitutional: Negative for fever. Eyes: Negative for visual complaints ENT: Negative for recent illness/congestion Cardiovascular: Negative for chest pain. Respiratory: Negative for shortness of breath.  Mild cough Gastrointestinal: Negative for abdominal pain.  Intermittent nausea and vomiting.  Negative for diarrhea. Genitourinary: Negative for urinary compaints Musculoskeletal: States increased leg pain and swelling bilaterally. Skin: Negative for rash or redness Neurological: Negative for headache All other ROS negative  ____________________________________________   PHYSICAL EXAM:  VITAL SIGNS: ED Triage Vitals [10/04/17 1448]  Enc Vitals Group  BP 127/80     Pulse Rate (!) 101     Resp 17     Temp 98.7 F (37.1 C)     Temp Source Oral     SpO2 100 %     Weight 115 lb (52.2 kg)     Height 5\' 6"  (1.676 m)     Head Circumference      Peak Flow      Pain Score 10     Pain Loc      Pain Edu?       Excl. in [place]?    Constitutional: Alert and oriented. Well appearing and in no distress. Eyes: Normal exam ENT   Head: Normocephalic and atraumatic.   Mouth/Throat: Mucous membranes are moist. Cardiovascular: Normal rate, regular rhythm.  No murmur. Respiratory: Normal respiratory effort without tachypnea nor retractions. Breath sounds are clear  Gastrointestinal: Soft and nontender. No distention.   Musculoskeletal: Nontender with normal range of motion in all extremities.  There is possibly slight pedal edema, which is equal bilaterally, otherwise no edema appreciated.  2+ DP pulses bilaterally.  No erythema. Neurologic:  Normal speech and language. No gross focal neurologic deficits are appreciated. Skin:  Skin is warm, dry and intact.  Psychiatric: Mood and affect are normal. Speech and behavior are normal.   ____________________________________________   INITIAL IMPRESSION / ASSESSMENT AND PLAN / ED COURSE  Pertinent labs & imaging results that were available during my care of the patient were reviewed by me and considered in my medical decision making (see chart for details).  Patient presents with complaints of increased lower extremity edema and pain.  On exam patient has possible slight pedal edema but no significant edema appreciated.  Nontender calf, no erythema, 2+ DP pulse.  Overall very normal appearance to his lower extremities.  Patient's labs have resulted showing a normal white blood cell count but elevated glucose 487 with a mild decrease in sodium consistent with pseudohyponatremia.  Patient states he did not take insulin this morning because he forgot.  We will dose insulin, 500 cc of IV fluids and dose pain medication while awaiting blood sugar to decrease.  I have also added on a cardiac enzyme as a precaution which is pending at this time.  Overall the patient appears very well, no distress.  Patient's blood glucose increase to 557.  I discussed this with the  patient he states he has been drinking Gatorade out of his backpack.  We will dose an additional IV dose of insulin.  Plan to discharge home once blood glucose is less than 300.  Patient will follow up with cardiology regarding his ongoing peripheral edema although very slight if at all currently.  ____________________________________________   FINAL CLINICAL IMPRESSION(S) / ED DIAGNOSES  Leg pain Hyperglycemia    Harvest Dark, MD 10/04/17 1950

## 2017-10-04 NOTE — ED Provider Notes (Signed)
vitals normalize, blood sugar improved to 380. Pain improved, suitable for discharge home as per Dr. Lucilla Lame plan.   Carrie Mew, MD 10/04/17 2133

## 2017-10-04 NOTE — ED Triage Notes (Signed)
Pt reports for the past 4 days he has been having worsening of swelling in BLE. Pt was seen here about 1 month ago and placed on PO lasix but pt reports this is not helping and his legs are painful.

## 2017-11-09 ENCOUNTER — Inpatient Hospital Stay
Admission: EM | Admit: 2017-11-09 | Discharge: 2017-11-11 | DRG: 638 | Disposition: A | Discharge status: Home / Self Care | Payer: BLUE CROSS/BLUE SHIELD | Attending: Internal Medicine | Admitting: Internal Medicine

## 2017-11-09 ENCOUNTER — Other Ambulatory Visit: Payer: Self-pay

## 2017-11-09 DIAGNOSIS — Z794 Long term (current) use of insulin: Secondary | ICD-10-CM | POA: Diagnosis not present

## 2017-11-09 DIAGNOSIS — R112 Nausea with vomiting, unspecified: Secondary | ICD-10-CM

## 2017-11-09 DIAGNOSIS — R739 Hyperglycemia, unspecified: Secondary | ICD-10-CM

## 2017-11-09 DIAGNOSIS — Z833 Family history of diabetes mellitus: Secondary | ICD-10-CM | POA: Diagnosis not present

## 2017-11-09 DIAGNOSIS — Z79899 Other long term (current) drug therapy: Secondary | ICD-10-CM

## 2017-11-09 DIAGNOSIS — N179 Acute kidney failure, unspecified: Secondary | ICD-10-CM | POA: Diagnosis present

## 2017-11-09 DIAGNOSIS — E871 Hypo-osmolality and hyponatremia: Secondary | ICD-10-CM | POA: Diagnosis present

## 2017-11-09 DIAGNOSIS — E1040 Type 1 diabetes mellitus with diabetic neuropathy, unspecified: Secondary | ICD-10-CM | POA: Diagnosis present

## 2017-11-09 DIAGNOSIS — Z881 Allergy status to other antibiotic agents status: Secondary | ICD-10-CM | POA: Diagnosis not present

## 2017-11-09 DIAGNOSIS — Z882 Allergy status to sulfonamides status: Secondary | ICD-10-CM | POA: Diagnosis not present

## 2017-11-09 DIAGNOSIS — E860 Dehydration: Secondary | ICD-10-CM | POA: Diagnosis present

## 2017-11-09 DIAGNOSIS — K219 Gastro-esophageal reflux disease without esophagitis: Secondary | ICD-10-CM | POA: Diagnosis present

## 2017-11-09 DIAGNOSIS — I10 Essential (primary) hypertension: Secondary | ICD-10-CM | POA: Diagnosis present

## 2017-11-09 DIAGNOSIS — K580 Irritable bowel syndrome with diarrhea: Secondary | ICD-10-CM | POA: Diagnosis present

## 2017-11-09 DIAGNOSIS — G8929 Other chronic pain: Secondary | ICD-10-CM | POA: Diagnosis present

## 2017-11-09 DIAGNOSIS — E1010 Type 1 diabetes mellitus with ketoacidosis without coma: Secondary | ICD-10-CM | POA: Diagnosis present

## 2017-11-09 DIAGNOSIS — E861 Hypovolemia: Secondary | ICD-10-CM | POA: Diagnosis present

## 2017-11-09 DIAGNOSIS — E1100 Type 2 diabetes mellitus with hyperosmolarity without nonketotic hyperglycemic-hyperosmolar coma (NKHHC): Secondary | ICD-10-CM | POA: Diagnosis present

## 2017-11-09 DIAGNOSIS — E876 Hypokalemia: Secondary | ICD-10-CM | POA: Diagnosis present

## 2017-11-09 DIAGNOSIS — F331 Major depressive disorder, recurrent, moderate: Secondary | ICD-10-CM

## 2017-11-09 DIAGNOSIS — Z91018 Allergy to other foods: Secondary | ICD-10-CM | POA: Diagnosis not present

## 2017-11-09 LAB — COMPREHENSIVE METABOLIC PANEL
ALT: 29 U/L (ref 17–63)
AST: 33 U/L (ref 15–41)
Albumin: 3.7 g/dL (ref 3.5–5.0)
Alkaline Phosphatase: 67 U/L (ref 38–126)
Anion gap: 8 (ref 5–15)
BUN: 21 mg/dL — ABNORMAL HIGH (ref 6–20)
CALCIUM: 8.5 mg/dL — AB (ref 8.9–10.3)
CO2: 25 mmol/L (ref 22–32)
CREATININE: 1.31 mg/dL — AB (ref 0.61–1.24)
Chloride: 91 mmol/L — ABNORMAL LOW (ref 101–111)
GFR calc Af Amer: >60 mL/min (ref >60)
Glucose, Bld: 844 mg/dL (ref 65–99)
Potassium: 4.7 mmol/L (ref 3.5–5.1)
Sodium: 124 mmol/L — ABNORMAL LOW (ref 135–145)
Total Bilirubin: 0.2 mg/dL — ABNORMAL LOW (ref 0.3–1.2)
Total Protein: 6.7 g/dL (ref 6.5–8.1)

## 2017-11-09 LAB — CBC WITH DIFFERENTIAL/PLATELET
Basophils Absolute: 0.1 10*3/uL (ref 0–0.1)
Basophils Relative: 1 %
Eosinophils Absolute: 0.1 10*3/uL (ref 0–0.7)
Eosinophils Relative: 2 %
HCT: 32.8 % — ABNORMAL LOW (ref 40.0–52.0)
Hemoglobin: 10.8 g/dL — ABNORMAL LOW (ref 13.0–18.0)
LYMPHS ABS: 1.2 10*3/uL (ref 1.0–3.6)
LYMPHS PCT: 17 %
MCH: 28.6 pg (ref 26.0–34.0)
MCHC: 33.1 g/dL (ref 32.0–36.0)
MCV: 86.4 fL (ref 80.0–100.0)
Monocytes Absolute: 0.8 10*3/uL (ref 0.2–1.0)
Monocytes Relative: 10 %
Neutro Abs: 5.3 10*3/uL (ref 1.4–6.5)
Neutrophils Relative %: 70 %
PLATELETS: 230 10*3/uL (ref 150–440)
RBC: 3.79 MIL/uL — ABNORMAL LOW (ref 4.40–5.90)
RDW: 13.1 % (ref 11.5–14.5)
WBC: 7.5 10*3/uL (ref 3.8–10.6)

## 2017-11-09 LAB — GLUCOSE, CAPILLARY
GLUCOSE-CAPILLARY: 123 mg/dL — AB (ref 65–99)
GLUCOSE-CAPILLARY: 390 mg/dL — AB (ref 65–99)
GLUCOSE-CAPILLARY: 565 mg/dL — AB (ref 65–99)
Glucose-Capillary: 540 mg/dL (ref 65–99)
Glucose-Capillary: 208 mg/dL — ABNORMAL HIGH (ref 65–99)

## 2017-11-09 LAB — URINALYSIS, COMPLETE (UACMP) WITH MICROSCOPIC
Bacteria, UA: NONE SEEN
Bilirubin Urine: NEGATIVE
Ketones, ur: NEGATIVE mg/dL
Leukocytes, UA: NEGATIVE
Nitrite: NEGATIVE
Protein, ur: 30 mg/dL — AB
Specific Gravity, Urine: 1.018 (ref 1.005–1.030)
Squamous Epithelial / LPF: NONE SEEN (ref 0–5)
WBC, UA: NONE SEEN WBC/hpf (ref 0–5)
pH: 5 (ref 5.0–8.0)

## 2017-11-09 LAB — LIPASE, BLOOD: Lipase: 83 U/L — ABNORMAL HIGH (ref 11–51)

## 2017-11-09 MED ORDER — ONDANSETRON HCL 4 MG/2ML IJ SOLN
4.0000 mg | Freq: Once | INTRAMUSCULAR | Status: AC
  Administered 2017-11-09: 4 mg via INTRAVENOUS

## 2017-11-09 MED ORDER — MORPHINE SULFATE (PF) 4 MG/ML IV SOLN
4.0000 mg | Freq: Once | INTRAVENOUS | Status: AC
  Administered 2017-11-09: 4 mg via INTRAVENOUS
  Filled 2017-11-09: qty 1

## 2017-11-09 MED ORDER — SODIUM CHLORIDE 0.9 % IV BOLUS
1000.0000 mL | Freq: Once | INTRAVENOUS | Status: AC
  Administered 2017-11-09: 1000 mL via INTRAVENOUS

## 2017-11-09 MED ORDER — METOCLOPRAMIDE HCL 5 MG/ML IJ SOLN
10.0000 mg | Freq: Once | INTRAMUSCULAR | Status: AC
  Administered 2017-11-09: 10 mg via INTRAVENOUS
  Filled 2017-11-09: qty 2

## 2017-11-09 MED ORDER — ONDANSETRON HCL 4 MG/2ML IJ SOLN
INTRAMUSCULAR | Status: AC
  Filled 2017-11-09: qty 2

## 2017-11-09 MED ORDER — SODIUM CHLORIDE 0.9 % IV SOLN
INTRAVENOUS | Status: DC
  Administered 2017-11-09: 5.1 [IU]/h via INTRAVENOUS
  Filled 2017-11-09: qty 1

## 2017-11-09 NOTE — ED Notes (Signed)
Date and time results received: 11/09/17 6:20 PM'  (use smartphrase ".now" to insert current time Test: glucose Critical Value: 844  Name of Provider Notified: Siadecki

## 2017-11-09 NOTE — ED Notes (Signed)
Report to Raquel, RN

## 2017-11-09 NOTE — H&P (Signed)
[place] [facility] [place] NAME: Shawn [name]    MR#:  [id number]  DATE OF BIRTH:  July 01, 1986  DATE OF ADMISSION:  11/09/2017  PRIMARY CARE PHYSICIAN: Patient, No Pcp Per   REQUESTING/REFERRING PHYSICIAN:   CHIEF COMPLAINT:   Chief Complaint  Patient presents with  . Abdominal Pain  . Emesis  . Diarrhea  . Hyperglycemia    HISTORY OF PRESENT ILLNESS: Shawn [name]  is a 32 y.o. male with a known history of diabetes type 2, insulin-dependent, gastroesophageal reflux disease, IBS. Patient presented to emergency room for acute onset of epigastric pain, nausea, vomiting and loose stool going on for the past 2 days, progressively getting worse.  The epigastric pain is constant 8 out of 10 ache, without any radiation, worse after meals. He also noted his blood sugars being high, in 500s at home.  He has not been able to take his insulin for the past 3 days because he dropped his insulin vial by mistake and broke.he could not afford to buy more insulin, until he got his paycheck. Patient denies any fever or chills, no chest pain/shortness of breath, no bleeding. Blood test done emergency room are remarkable for sodium level at 124, K 4.7, glucose level at 844, creatinine level 1.31, hemoglobin level 10.8.  Anion gap 8.  Collated serum osmolality is 303.  UA is negative for UTI. Patient is admitted for further evaluation and treatment.  PAST MEDICAL HISTORY:   Past Medical History:  Diagnosis Date  . Diabetes mellitus without complication ([place])   . GERD (gastroesophageal reflux disease)   . IBS (irritable bowel syndrome)     PAST SURGICAL HISTORY: History reviewed. No pertinent surgical history.  SOCIAL HISTORY:  Social History   Tobacco Use  . Smoking status: Never Smoker  . Smokeless tobacco: Never Used  Substance Use Topics  . Alcohol use: No    Frequency: Never    FAMILY HISTORY:  Family History  Problem Relation Age of Onset   . Diabetes Mother   . Ovarian cancer Mother     DRUG ALLERGIES:  Allergies  Allergen Reactions  . Banana Hives, Nausea And Vomiting and Rash  . Keflex [Cephalexin] Anaphylaxis  . Onion Hives, Nausea And Vomiting and Rash  . Sulfa Antibiotics Anaphylaxis  . Grapeseed Extract [Nutritional Supplements] Itching    REVIEW OF SYSTEMS:   CONSTITUTIONAL: No fever, but he complains of fatigue and generalized weakness.  EYES: No blurred or double vision.  EARS, NOSE, AND THROAT: No tinnitus or ear pain.  RESPIRATORY: No cough, shortness of breath, wheezing or hemoptysis.  CARDIOVASCULAR: No chest pain, orthopnea.There is chronic 1+ bilateral pedal edema.  GASTROINTESTINAL: Positive for epigastric pain, nausea, vomiting and loose stool.  GENITOURINARY: No dysuria, hematuria.  ENDOCRINE: No polyuria, nocturia,  HEMATOLOGY: No bleeding SKIN: No rash or lesion. MUSCULOSKELETAL: No joint pain.   NEUROLOGIC: No focal weakness.  PSYCHIATRY: No anxiety or depression.   MEDICATIONS AT HOME:  Prior to Admission medications   Medication Sig Start Date End Date Taking? Authorizing Provider  insulin NPH-regular Human (NOVOLIN 70/30) (70-30) 100 UNIT/ML injection 15 units every morning and 10 units at bedtime 09/06/07  Yes [provider]  furosemide (LASIX) 20 MG tablet Take 1 tablet (20 mg total) by mouth daily as needed for up to 7 days. 07/20/17 10/04/17  Rudene Re, MD  ondansetron (ZOFRAN-ODT) 4 MG disintegrating tablet Take 1 tablet (4 mg total) by mouth every 6 (six)  hours as needed for nausea. Patient not taking: Reported on 11/09/2017 07/15/17   Clayburn Pert, MD  oxyCODONE-acetaminophen (ROXICET) 5-325 MG tablet Take 1 tablet by mouth every 6 (six) hours as needed. Patient not taking: Reported on 10/04/2017 07/20/17 07/20/18  Rudene Re, MD      PHYSICAL EXAMINATION:   VITAL SIGNS: Blood pressure 117/83, pulse 78, temperature 98.6 F (37 C), temperature source Oral,  resp. rate 18, height 5\' 6"  (1.676 m), weight 49.9 kg (110 lb), SpO2 99 %.  GENERAL:  32 y.o.-year-old patient lying in the bed with moderate distress, secondary to epigastric pain.  EYES: Pupils equal, round, reactive to light and accommodation. No scleral icterus. HEENT: Head atraumatic, normocephalic. Oropharynx and nasopharynx clear.  NECK:  Supple, no jugular venous distention. No thyroid enlargement, no tenderness.  LUNGS: Reduced breath sounds bilaterally, no wheezing, rales,rhonchi or crepitation. No use of accessory muscles of respiration.  CARDIOVASCULAR: S1, S2 normal. No murmurs, rubs, or gallops.  ABDOMEN: Soft, nontender, nondistended. Bowel sounds present. No organomegaly or mass.  EXTREMITIES: No pedal edema, cyanosis, or clubbing.  NEUROLOGIC: No focal weakness.  PSYCHIATRIC: The patient is alert and oriented x 3.  SKIN: No obvious rash, lesion, or ulcer.   LABORATORY PANEL:   CBC Recent Labs  Lab 11/09/17 1723  WBC 7.5  HGB 10.8*  HCT 32.8*  PLT 230  MCV 86.4  MCH 28.6  MCHC 33.1  RDW 13.1  LYMPHSABS 1.2  MONOABS 0.8  EOSABS 0.1  BASOSABS 0.1   ------------------------------------------------------------------------------------------------------------------  Chemistries  Recent Labs  Lab 11/09/17 1723  NA 124*  K 4.7  CL 91*  CO2 25  GLUCOSE 844*  BUN 21*  CREATININE 1.31*  CALCIUM 8.5*  AST 33  ALT 29  ALKPHOS 67  BILITOT 0.2*   ------------------------------------------------------------------------------------------------------------------ estimated creatinine clearance is 58.2 mL/min (A) (by C-G formula based on SCr of 1.31 mg/dL (H)). ------------------------------------------------------------------------------------------------------------------ No results for input(s): TSH, T4TOTAL, T3FREE, THYROIDAB in the last 72 hours.  Invalid input(s): FREET3   Coagulation profile No results for input(s): INR, PROTIME in the last 168  hours. ------------------------------------------------------------------------------------------------------------------- No results for input(s): DDIMER in the last 72 hours. -------------------------------------------------------------------------------------------------------------------  Cardiac Enzymes No results for input(s): CKMB, TROPONINI, MYOGLOBIN in the last 168 hours.  Invalid input(s): CK ------------------------------------------------------------------------------------------------------------------ Invalid input(s): POCBNP  ---------------------------------------------------------------------------------------------------------------  Urinalysis    Component Value Date/Time   COLORURINE COLORLESS (A) 11/09/2017 1815   APPEARANCEUR CLEAR (A) 11/09/2017 1815   APPEARANCEUR Clear 10/06/2014 0758   LABSPEC 1.018 11/09/2017 1815   LABSPEC 1.031 10/06/2014 0758   PHURINE 5.0 11/09/2017 1815   GLUCOSEU >=500 (A) 11/09/2017 1815   GLUCOSEU >=500 10/06/2014 0758   HGBUR MODERATE (A) 11/09/2017 1815   BILIRUBINUR NEGATIVE 11/09/2017 1815   BILIRUBINUR Negative 10/06/2014 0758   KETONESUR NEGATIVE 11/09/2017 1815   PROTEINUR 30 (A) 11/09/2017 1815   UROBILINOGEN 0.2 03/29/2007 1506   NITRITE NEGATIVE 11/09/2017 1815   LEUKOCYTESUR NEGATIVE 11/09/2017 1815   LEUKOCYTESUR Negative 10/06/2014 0758     RADIOLOGY: No results found.  EKG: Orders placed or performed during the hospital encounter of 07/13/17  . ED EKG  . ED EKG  . EKG 12-Lead  . EKG 12-Lead  . EKG    IMPRESSION AND PLAN:  1.  Hyperosmolar nonketotic hyperglycemia.  Patient was treated with insulin drip in the emergency room.  His blood sugar improved to 172.  Sodium level normalized to 135.  Potassium level is now 3.3.  We will stop insulin drip and  start Lantus subq.  Continue IV fluids of normal saline.  Need to check blood sugars every 2 hours for now. 2.  Hyponatremia, secondary to  hyperglycemia.  Resolved with insulin drip and glucose normalizing.  Continue to monitor electrolytes level closely. 3.  Hypokalemia, status post glucose normalizing.  Will replace potassium level per protocol. 4.  Epigastric pain, likely related to hyperosmolar nonketotic hyperglycemia and gastroparesis vs possible gastritis.  Will start PPI treatment with Protonix.  Continue Reglan, as well. 5.  Acute renal failure.  Creatinine was initially slightly elevated at 1.3, likely secondary to hyperglycemia.  Creatinine level improved with IV fluids per insulin drip protocol.  All the records are reviewed and case discussed with ED provider. Management plans discussed with the patient, who is in agreement.  CODE STATUS: FULL Code Status History    Date Active Date Inactive Code Status Order ID Comments User Context   07/13/2017 2112 07/15/2017 1730 Full Code [id number]  Jules Husbands, MD ED       TOTAL TIME TAKING CARE OF THIS PATIENT: 45 minutes.    Amelia Jo M.D on 11/09/2017 at 11:16 PM  Between 7am to 6pm - Pager - [phone]  After 6pm go to [email or web address] - password [email or web address] [facility]  Office  [phone]  CC: Primary care physician; Patient, No Pcp Per

## 2017-11-09 NOTE — ED Triage Notes (Signed)
Pt c/o abd pain with N/V/D x2 days - he states he is diabetic and dropped his insulin 2 days ago and has no money to refill meds until he gets paid - c/o bilat feet swelling and fall related to the pain and swelling in feet

## 2017-11-09 NOTE — ED Notes (Signed)
No change in condition, awaiting room assignment.

## 2017-11-09 NOTE — ED Provider Notes (Signed)
[facility] Emergency Department Provider Note ____________________________________________   First MD Initiated Contact with Patient 11/09/17 1807     (approximate)  I have reviewed the triage vital signs and the nursing notes.   HISTORY  Chief Complaint Abdominal Pain; Emesis; Diarrhea; and Hyperglycemia    HPI Shawn [name] is a 32 y.o. male with PMH as noted below including insulin-dependent diabetes who presents with epigastric abdominal pain over the last 2 days, gradual onset, associated with nausea, vomiting, diarrhea, as well as elevated blood sugars.  Patient states that he has been unable to take his insulin for the last 2 days because he dropped and broke his vial and could not afford to replace it until he got paid later this week.  Patient denies fever, urinary symptoms, chest pain, or difficulty breathing.   Past Medical History:  Diagnosis Date  . Diabetes mellitus without complication ([place])   . GERD (gastroesophageal reflux disease)   . IBS (irritable bowel syndrome)     Patient Active Problem List   Diagnosis Date Noted  . History of migraine 07/15/2017  . IBS (irritable bowel syndrome) 07/15/2017  . Protein-calorie malnutrition, severe 07/14/2017  . Abdominal pain 07/13/2017  . GERD (gastroesophageal reflux disease) 12/30/2009  . Diabetes ([place]) 11/25/2009  . MDD (major depressive disorder) 11/25/2009  . Hypercholesterolemia 03/07/2008    History reviewed. No pertinent surgical history.  Prior to Admission medications   Medication Sig Start Date End Date Taking? Authorizing Provider  insulin NPH-regular Human (NOVOLIN 70/30) (70-30) 100 UNIT/ML injection 15 units every morning and 10 units at bedtime 09/06/07  Yes [provider]  furosemide (LASIX) 20 MG tablet Take 1 tablet (20 mg total) by mouth daily as needed for up to 7 days. 07/20/17 10/04/17  Rudene Re, MD  ondansetron (ZOFRAN-ODT) 4 MG disintegrating  tablet Take 1 tablet (4 mg total) by mouth every 6 (six) hours as needed for nausea. Patient not taking: Reported on 11/09/2017 07/15/17   Clayburn Pert, MD  oxyCODONE-acetaminophen (ROXICET) 5-325 MG tablet Take 1 tablet by mouth every 6 (six) hours as needed. Patient not taking: Reported on 10/04/2017 07/20/17 07/20/18  Rudene Re, MD    Allergies Banana; Keflex [cephalexin]; Onion; Sulfa antibiotics; and Grapeseed extract [nutritional supplements]  Family History  Problem Relation Age of Onset  . Diabetes Mother   . Ovarian cancer Mother     Social History Social History   Tobacco Use  . Smoking status: Never Smoker  . Smokeless tobacco: Never Used  Substance Use Topics  . Alcohol use: No    Frequency: Never  . Drug use: No    Review of Systems  Constitutional: No fever.  Positive for generalized weakness. Eyes: No redness. ENT: No sore throat. Cardiovascular: Denies chest pain. Respiratory: Denies shortness of breath. Gastrointestinal: Positive for nausea, vomiting, and diarrhea.  Genitourinary: Positive for polyuria. Musculoskeletal: Negative for back pain. Skin: Negative for rash. Neurological: Negative for headache.   ____________________________________________   PHYSICAL EXAM:  VITAL SIGNS: ED Triage Vitals [11/09/17 1711]  Enc Vitals Group     BP (!) 152/106     Pulse Rate (!) 101     Resp 20     Temp 98.6 F (37 C)     Temp Source Oral     SpO2 100 %     Weight 110 lb (49.9 kg)     Height 5\' 6"  (1.676 m)     Head Circumference      Peak  Flow      Pain Score 10     Pain Loc      Pain Edu?      Excl. in [place]?     Constitutional: Alert and oriented.  Uncomfortable appearing but in no acute distress. Eyes: Conjunctivae are normal.  EOMI.  PERRLA. Head: Atraumatic. Nose: No congestion/rhinnorhea. Mouth/Throat: Mucous membranes are dry.   Neck: Normal range of motion.  Cardiovascular: Tachycardic, regular rhythm. Grossly normal heart  sounds.  Good peripheral circulation. Respiratory: Normal respiratory effort.  No retractions. Lungs CTAB. Gastrointestinal: Soft with mild epigastric tenderness. No distention.  Genitourinary: No flank tenderness. Musculoskeletal: No lower extremity edema.  Extremities warm and well perfused.  Neurologic:  Normal speech and language. No gross focal neurologic deficits are appreciated.  Skin:  Skin is warm and dry. No rash noted. Psychiatric: Mood and affect are normal. Speech and behavior are normal.  ____________________________________________   LABS (all labs ordered are listed, but only abnormal results are displayed)  Labs Reviewed  URINALYSIS, COMPLETE (UACMP) WITH MICROSCOPIC - Abnormal; Notable for the following components:      Result Value   Color, Urine COLORLESS (*)    APPearance CLEAR (*)    Glucose, UA >=500 (*)    Hgb urine dipstick MODERATE (*)    Protein, ur 30 (*)    All other components within normal limits  CBC WITH DIFFERENTIAL/PLATELET - Abnormal; Notable for the following components:   RBC 3.79 (*)    Hemoglobin 10.8 (*)    HCT 32.8 (*)    All other components within normal limits  COMPREHENSIVE METABOLIC PANEL - Abnormal; Notable for the following components:   Sodium 124 (*)    Chloride 91 (*)    Glucose, Bld 844 (*)    BUN 21 (*)    Creatinine, Ser 1.31 (*)    Calcium 8.5 (*)    Total Bilirubin 0.2 (*)    All other components within normal limits  LIPASE, BLOOD - Abnormal; Notable for the following components:   Lipase 83 (*)    All other components within normal limits  GLUCOSE, CAPILLARY - Abnormal; Notable for the following components:   Glucose-Capillary >600 (*)    All other components within normal limits  GLUCOSE, CAPILLARY - Abnormal; Notable for the following components:   Glucose-Capillary 565 (*)    All other components within normal limits  GLUCOSE, CAPILLARY - Abnormal; Notable for the following components:   Glucose-Capillary 540  (*)    All other components within normal limits  BLOOD GAS, VENOUS  CBG MONITORING, ED   ____________________________________________  EKG   ____________________________________________  RADIOLOGY    ____________________________________________   PROCEDURES  Procedure(s) performed: No  Procedures  Critical Care performed: No ____________________________________________   INITIAL IMPRESSION / ASSESSMENT AND PLAN / ED COURSE  Pertinent labs & imaging results that were available during my care of the patient were reviewed by me and considered in my medical decision making (see chart for details).  32 year old male with PMH as noted above including insulin-dependent diabetes presents with epigastric pain, nausea and vomiting, diarrhea, and elevated blood sugars at home after being unable to take his insulin in the last several days.  I reviewed the past medical records in Epic; the patient has had several ED visits in the last several months for hyperglycemia, and was admitted in January with concern of possible internal hernia although he did not require surgery.  On exam, the vital signs are normal except for mild  tachycardia, the patient is uncomfortable appearing, the abdomen is soft with no focal tenderness or distention, and the remainder the exam is as described above.  Differential includes hyperglycemia, DKA, acute gastroenteritis, gastroparesis, or less likely UTI or other source of infection.  Plan: IV fluids, insulin via glucose stabilizer, lab work-up to rule out DKA, and reassess.   ----------------------------------------- 8:40 PM on 11/09/2017 -----------------------------------------  Patient's lab work-up shows extremely elevated glucose but no acidosis or anion gap so there is no evidence of DKA.  Glucose has improved although the patient remains symptomatic with nausea and abdominal discomfort.  Given the extremely elevated glucose and patient's  persistent symptoms I will proceed with admission.  I signed the patient out to the hospitalist Dr. Anselm Jungling. ____________________________________________   FINAL CLINICAL IMPRESSION(S) / ED DIAGNOSES  Final diagnoses:  Hyperglycemia  Dehydration  Non-intractable vomiting with nausea, unspecified vomiting type      NEW MEDICATIONS STARTED DURING THIS VISIT:  New Prescriptions   No medications on file     Note:  This document was prepared using Dragon voice recognition software and may include unintentional dictation errors.    Arta Silence, MD 11/09/17 2041

## 2017-11-09 NOTE — ED Notes (Addendum)
Report received from [facility]. Patient care assumed. Patient/RN introduction complete. Pt still co generalized abd  Pain rates it 8/10 at this time with nausea. IVF's infusing well, peripheral lines are intact without redness or swelling.

## 2017-11-10 DIAGNOSIS — F331 Major depressive disorder, recurrent, moderate: Secondary | ICD-10-CM

## 2017-11-10 LAB — BASIC METABOLIC PANEL
Anion gap: 3 — ABNORMAL LOW (ref 5–15)
Anion gap: 3 — ABNORMAL LOW (ref 5–15)
BUN: 17 mg/dL (ref 6–20)
BUN: 13 mg/dL (ref 6–20)
CALCIUM: 8.1 mg/dL — AB (ref 8.9–10.3)
CHLORIDE: 108 mmol/L (ref 101–111)
CO2: 25 mmol/L (ref 22–32)
CO2: 24 mmol/L (ref 22–32)
CREATININE: 0.93 mg/dL (ref 0.61–1.24)
Calcium: 8 mg/dL — ABNORMAL LOW (ref 8.9–10.3)
Chloride: 107 mmol/L (ref 101–111)
Creatinine, Ser: 0.92 mg/dL (ref 0.61–1.24)
GFR calc Af Amer: >60 mL/min (ref >60)
GFR calc non Af Amer: >60 mL/min (ref >60)
GLUCOSE: 172 mg/dL — AB (ref 65–99)
Glucose, Bld: 268 mg/dL — ABNORMAL HIGH (ref 65–99)
Potassium: 3.3 mmol/L — ABNORMAL LOW (ref 3.5–5.1)
Potassium: 3.9 mmol/L (ref 3.5–5.1)
Sodium: 135 mmol/L (ref 135–145)
Sodium: 135 mmol/L (ref 135–145)

## 2017-11-10 LAB — HEMOGLOBIN A1C
Hgb A1c MFr Bld: 17.2 % — ABNORMAL HIGH (ref 4.8–5.6)
Mean Plasma Glucose: 446.94 mg/dL

## 2017-11-10 LAB — OSMOLALITY: Osmolality: 289 mOsm/kg (ref 275–295)

## 2017-11-10 LAB — BLOOD GAS, VENOUS
Acid-Base Excess: 0.8 mmol/L (ref 0.0–2.0)
Bicarbonate: 28 mmol/L (ref 20.0–28.0)
Patient temperature: 37
pCO2, Ven: 57 mmHg (ref 44.0–60.0)
pH, Ven: 7.3 (ref 7.250–7.430)

## 2017-11-10 LAB — CBC
HEMATOCRIT: 29.2 % — AB (ref 40.0–52.0)
Hemoglobin: 10.5 g/dL — ABNORMAL LOW (ref 13.0–18.0)
MCH: 29.5 pg (ref 26.0–34.0)
MCHC: 35.9 g/dL (ref 32.0–36.0)
MCV: 82 fL (ref 80.0–100.0)
Platelets: 194 10*3/uL (ref 150–440)
RBC: 3.55 MIL/uL — AB (ref 4.40–5.90)
RDW: 12.6 % (ref 11.5–14.5)
WBC: 8.4 10*3/uL (ref 3.8–10.6)

## 2017-11-10 LAB — GLUCOSE, CAPILLARY
GLUCOSE-CAPILLARY: 253 mg/dL — AB (ref 65–99)
Glucose-Capillary: 208 mg/dL — ABNORMAL HIGH (ref 65–99)
Glucose-Capillary: 247 mg/dL — ABNORMAL HIGH (ref 65–99)
Glucose-Capillary: 247 mg/dL — ABNORMAL HIGH (ref 65–99)
Glucose-Capillary: 202 mg/dL — ABNORMAL HIGH (ref 65–99)
Glucose-Capillary: 271 mg/dL — ABNORMAL HIGH (ref 65–99)
Glucose-Capillary: 318 mg/dL — ABNORMAL HIGH (ref 65–99)
Glucose-Capillary: 276 mg/dL — ABNORMAL HIGH (ref 65–99)

## 2017-11-10 MED ORDER — INSULIN GLARGINE 100 UNIT/ML ~~LOC~~ SOLN
25.0000 [IU] | Freq: Every day | SUBCUTANEOUS | Status: DC
  Filled 2017-11-10: qty 0.25

## 2017-11-10 MED ORDER — HYDROCODONE-ACETAMINOPHEN 5-325 MG PO TABS
1.0000 | ORAL_TABLET | ORAL | Status: DC | PRN
  Administered 2017-11-10: 10:00:00 2 via ORAL
  Administered 2017-11-10: 06:00:00 1 via ORAL
  Filled 2017-11-10: qty 1
  Filled 2017-11-10: qty 2
  Filled 2017-11-10: qty 1

## 2017-11-10 MED ORDER — ACETAMINOPHEN 325 MG PO TABS: 650.0000 mg | ORAL_TABLET | Freq: Four times a day (QID) | ORAL | Status: DC | PRN

## 2017-11-10 MED ORDER — SODIUM CHLORIDE 0.9 % IV SOLN
INTRAVENOUS | Status: DC
  Administered 2017-11-10 (×3): via INTRAVENOUS

## 2017-11-10 MED ORDER — ADULT MULTIVITAMIN W/MINERALS CH
1.0000 | ORAL_TABLET | Freq: Every day | ORAL | Status: DC
  Administered 2017-11-10 – 2017-11-11 (×2): 1 via ORAL
  Filled 2017-11-10 (×2): qty 1

## 2017-11-10 MED ORDER — DOCUSATE SODIUM 100 MG PO CAPS
100.0000 mg | ORAL_CAPSULE | Freq: Two times a day (BID) | ORAL | Status: DC
  Administered 2017-11-11: 08:00:00 100 mg via ORAL
  Filled 2017-11-10: qty 1

## 2017-11-10 MED ORDER — INSULIN ASPART 100 UNIT/ML ~~LOC~~ SOLN
0.0000 [IU] | Freq: Every day | SUBCUTANEOUS | Status: DC
  Administered 2017-11-10: 3 [IU] via SUBCUTANEOUS
  Filled 2017-11-10: qty 1

## 2017-11-10 MED ORDER — INSULIN ASPART 100 UNIT/ML ~~LOC~~ SOLN
3.0000 [IU] | Freq: Once | SUBCUTANEOUS | Status: AC
  Administered 2017-11-10: 3 [IU] via SUBCUTANEOUS
  Filled 2017-11-10: qty 1

## 2017-11-10 MED ORDER — GLUCERNA SHAKE PO LIQD
237.0000 mL | Freq: Three times a day (TID) | ORAL | Status: DC
  Administered 2017-11-10: 20:00:00 237 mL via ORAL

## 2017-11-10 MED ORDER — BISACODYL 5 MG PO TBEC: 5.0000 mg | DELAYED_RELEASE_TABLET | Freq: Every day | ORAL | Status: DC | PRN

## 2017-11-10 MED ORDER — LISINOPRIL 10 MG PO TABS
10.0000 mg | ORAL_TABLET | Freq: Every day | ORAL | Status: DC
  Administered 2017-11-10 – 2017-11-11 (×2): 10 mg via ORAL
  Filled 2017-11-10 (×2): qty 1

## 2017-11-10 MED ORDER — FLUOXETINE HCL 20 MG PO CAPS
20.0000 mg | ORAL_CAPSULE | Freq: Every day | ORAL | Status: DC
  Administered 2017-11-10 – 2017-11-11 (×2): 20 mg via ORAL
  Filled 2017-11-10 (×2): qty 1

## 2017-11-10 MED ORDER — ONDANSETRON HCL 4 MG/2ML IJ SOLN
INTRAMUSCULAR | Status: AC
  Administered 2017-11-10: 4 mg via INTRAVENOUS
  Filled 2017-11-10: qty 2

## 2017-11-10 MED ORDER — HEPARIN SODIUM (PORCINE) 5000 UNIT/ML IJ SOLN
5000.0000 [IU] | Freq: Three times a day (TID) | INTRAMUSCULAR | Status: DC
  Administered 2017-11-10 – 2017-11-11 (×4): 5000 [IU] via SUBCUTANEOUS
  Filled 2017-11-10 (×4): qty 1

## 2017-11-10 MED ORDER — ONDANSETRON HCL 4 MG/2ML IJ SOLN
4.0000 mg | Freq: Four times a day (QID) | INTRAMUSCULAR | Status: DC | PRN
  Administered 2017-11-10 – 2017-11-11 (×2): 4 mg via INTRAVENOUS
  Filled 2017-11-10: qty 2

## 2017-11-10 MED ORDER — ACETAMINOPHEN 650 MG RE SUPP: 650.0000 mg | Freq: Four times a day (QID) | RECTAL | Status: DC | PRN

## 2017-11-10 MED ORDER — METOCLOPRAMIDE HCL 5 MG PO TABS
10.0000 mg | ORAL_TABLET | Freq: Three times a day (TID) | ORAL | Status: DC
  Administered 2017-11-10 – 2017-11-11 (×4): 10 mg via ORAL
  Filled 2017-11-10 (×4): qty 2

## 2017-11-10 MED ORDER — MORPHINE SULFATE (PF) 2 MG/ML IV SOLN
2.0000 mg | INTRAVENOUS | Status: DC | PRN
  Administered 2017-11-10 – 2017-11-11 (×4): 2 mg via INTRAVENOUS
  Filled 2017-11-10 (×4): qty 1

## 2017-11-10 MED ORDER — ONDANSETRON HCL 4 MG PO TABS: 4.0000 mg | ORAL_TABLET | Freq: Four times a day (QID) | ORAL | Status: DC | PRN

## 2017-11-10 MED ORDER — INSULIN GLARGINE 100 UNIT/ML ~~LOC~~ SOLN: 20.0000 [IU] | Freq: Every day | SUBCUTANEOUS | Status: DC

## 2017-11-10 MED ORDER — PANTOPRAZOLE SODIUM 40 MG PO TBEC
40.0000 mg | DELAYED_RELEASE_TABLET | Freq: Every day | ORAL | Status: DC
  Administered 2017-11-10 – 2017-11-11 (×2): 40 mg via ORAL
  Filled 2017-11-10 (×2): qty 1

## 2017-11-10 MED ORDER — INSULIN ASPART 100 UNIT/ML ~~LOC~~ SOLN
0.0000 [IU] | Freq: Three times a day (TID) | SUBCUTANEOUS | Status: DC
  Administered 2017-11-10: 7 [IU] via SUBCUTANEOUS
  Administered 2017-11-10: 5 [IU] via SUBCUTANEOUS
  Administered 2017-11-10: 09:00:00 3 [IU] via SUBCUTANEOUS
  Administered 2017-11-11: 08:00:00 2 [IU] via SUBCUTANEOUS
  Filled 2017-11-10 (×5): qty 1

## 2017-11-10 MED ORDER — INSULIN GLARGINE 100 UNIT/ML ~~LOC~~ SOLN
20.0000 [IU] | Freq: Every day | SUBCUTANEOUS | Status: DC
Start: 2017-11-10 — End: 2017-11-11
  Administered 2017-11-10: 20 [IU] via SUBCUTANEOUS
  Filled 2017-11-10 (×2): qty 0.2

## 2017-11-10 MED ORDER — OXYCODONE HCL 5 MG PO TABS: 5.0000 mg | ORAL_TABLET | Freq: Four times a day (QID) | ORAL | Status: DC | PRN

## 2017-11-10 NOTE — Progress Notes (Signed)
Inpatient Diabetes Program Recommendations  AACE/ADA: New Consensus Statement on Inpatient Glycemic Control (2015)  Target Ranges:  Prepandial:   less than 140 mg/dL      Peak postprandial:   less than 180 mg/dL (1-2 hours)      Critically ill patients:  140 - 180 mg/dL   Lab Results  Component Value Date   GLUCAP 271 (H) 11/10/2017   HGBA1C 17.2 (H) 11/10/2017    Review of Glycemic Control  Diabetes history: DM1 Outpatient Diabetes medications: Novolin 70/30 insulin mix 15 units ac breakfast + 10 units pm (will verify time patient takes) Current orders for Inpatient glycemic control: lantus 25 units to begin this hs + Novolog sensitive correction tid + hs  Inpatient Diabetes Program Recommendations:   Patient is type 1 and takes 70/30 insulin total of 25 units daily (17.5 units basal + 7.5 units meal coverage). -Recommend give Lantus as soon as possible due to patient did not receive basal prior to discontinue of IV insulin and may need decrease to 18-20 units -Novolog 2 units tid meal coverage if eats 50%  Text page to Dr. Tressia Miners  Thank you, Nani Gasser. Tersea Aulds, RN, MSN, CDE  Diabetes Coordinator Inpatient Glycemic Control Team Team Pager [phone] (8am-5pm) 11/10/2017 12:57 PM

## 2017-11-10 NOTE — Consult Note (Signed)
[facility] Consult   Reason for Consult: Consult for 32 year old man with a history of diabetes complaining of depression Referring Physician: Tressia Miners Patient Identification: Shawn [name] MRN:  [id number] Principal Diagnosis: Moderate recurrent major depression ([place]) Diagnosis:   Patient Active Problem List   Diagnosis Date Noted  . Moderate recurrent major depression ([place]) [F33.1] 11/10/2017  . Type 2 diabetes mellitus with hyperosmolar nonketotic hyperglycemia ([place]) [E11.00] 11/09/2017  . History of migraine [Z86.69] 07/15/2017  . IBS (irritable bowel syndrome) [K58.9] 07/15/2017  . Protein-calorie malnutrition, severe [E43] 07/14/2017  . Abdominal pain [R10.9] 07/13/2017  . GERD (gastroesophageal reflux disease) [K21.9] 12/30/2009  . Diabetes ([place]) [E11.9] 11/25/2009  . MDD (major depressive disorder) [F32.9] 11/25/2009  . Hypercholesterolemia [E78.00] 03/07/2008    Total Time spent with patient: 1 hour  Subjective:   Shawn [name] is a 32 y.o. male patient admitted with "I feel depressed a lot".  HPI: 32 year old man with a history of diabetes currently in the hospital with complications of it.  Patient says he has felt depressed for several years.  Mood stays down and low all the time.  Gradually getting worse.  Feels like his energy is very low.  Feels hopeless about the future.  Cries a lot.  Often gets irritable.  Denies any suicidal thoughts or intent denies any psychotic symptoms.  Patient does not drink does not use drugs.  Has limited social support.  Has never gotten any kind of psychiatric treatment in the past.  Major stress is the severity of his illness and his chronic pain  Medical history: Diabetes with multiple hospitalizations and multiple complications including neuropathy  Social history: Lives with his father and his own 67 year old son.  Patient works at [employer] and just recently got insurance.  Substance abuse history: Denies  any alcohol or drug history    Past Psychiatric History: No previous psychiatric treatment at all never been in the hospital no suicide attempts no medication  Risk to Self: Is patient at risk for suicide?: No Risk to Others:   Prior Inpatient Therapy:   Prior Outpatient Therapy:    Past Medical History:  Past Medical History:  Diagnosis Date  . Diabetes mellitus without complication ([place])   . GERD (gastroesophageal reflux disease)   . IBS (irritable bowel syndrome)    History reviewed. No pertinent surgical history. Family History:  Family History  Problem Relation Age of Onset  . Diabetes Mother   . Ovarian cancer Mother    Family Psychiatric  History: None Social History:  Social History   Substance and Sexual Activity  Alcohol Use No  . Frequency: Never     Social History   Substance and Sexual Activity  Drug Use No    Social History   Socioeconomic History  . Marital status: Single    Spouse name: Not on file  . Number of children: Not on file  . Years of education: Not on file  . Highest education level: Not on file  Occupational History  . Not on file  Social Needs  . Financial resource strain: Not on file  . Food insecurity:    Worry: Not on file    Inability: Not on file  . Transportation needs:    Medical: Not on file    Non-medical: Not on file  Tobacco Use  . Smoking status: Never Smoker  . Smokeless tobacco: Never Used  Substance and Sexual Activity  . Alcohol use: No    Frequency: Never  .  Drug use: No  . Sexual activity: Not on file  Lifestyle  . Physical activity:    Days per week: Not on file    Minutes per session: Not on file  . Stress: Not on file  Relationships  . Social connections:    Talks on phone: Not on file    Gets together: Not on file    Attends religious service: Not on file    Active member of club or organization: Not on file    Attends meetings of clubs or organizations: Not on file    Relationship status:  Not on file  Other Topics Concern  . Not on file  Social History Narrative  . Not on file   Additional Social History:    Allergies:   Allergies  Allergen Reactions  . Banana Hives, Nausea And Vomiting and Rash  . Keflex [Cephalexin] Anaphylaxis  . Onion Hives, Nausea And Vomiting and Rash  . Sulfa Antibiotics Anaphylaxis  . Grapeseed Extract [Nutritional Supplements] Itching    Labs:  Results for orders placed or performed during the hospital encounter of 11/09/17 (from the past 48 hour(s))  Glucose, capillary     Status: Abnormal   Collection Time: 11/09/17  5:16 PM  Result Value Ref Range   Glucose-Capillary >600 (HH) 65 - 99 mg/dL  CBC with Differential     Status: Abnormal   Collection Time: 11/09/17  5:23 PM  Result Value Ref Range   WBC 7.5 3.8 - 10.6 K/uL   RBC 3.79 (L) 4.40 - 5.90 MIL/uL   Hemoglobin 10.8 (L) 13.0 - 18.0 g/dL   HCT 32.8 (L) 40.0 - 52.0 %   MCV 86.4 80.0 - 100.0 fL   MCH 28.6 26.0 - 34.0 pg   MCHC 33.1 32.0 - 36.0 g/dL   RDW 13.1 11.5 - 14.5 %   Platelets 230 150 - 440 K/uL   Neutrophils Relative % 70 %   Neutro Abs 5.3 1.4 - 6.5 K/uL   Lymphocytes Relative 17 %   Lymphs Abs 1.2 1.0 - 3.6 K/uL   Monocytes Relative 10 %   Monocytes Absolute 0.8 0.2 - 1.0 K/uL   Eosinophils Relative 2 %   Eosinophils Absolute 0.1 0 - 0.7 K/uL   Basophils Relative 1 %   Basophils Absolute 0.1 0 - 0.1 K/uL    Comment: Performed at [facility], [address] [place], [place] [address]  Comprehensive metabolic panel     Status: Abnormal   Collection Time: 11/09/17  5:23 PM  Result Value Ref Range   Sodium 124 (L) 135 - 145 mmol/L   Potassium 4.7 3.5 - 5.1 mmol/L   Chloride 91 (L) 101 - 111 mmol/L   CO2 25 22 - 32 mmol/L   Glucose, Bld 844 (HH) 65 - 99 mg/dL    Comment: CRITICAL RESULT CALLED TO, READ BACK BY AND VERIFIED WITH ALLY RILEY '@1816'$  11/09/17 AKT   BUN 21 (H) 6 - 20 mg/dL   Creatinine, Ser 1.31 (H) 0.61 - 1.24 mg/dL   Calcium 8.5 (L)  8.9 - 10.3 mg/dL   Total Protein 6.7 6.5 - 8.1 g/dL   Albumin 3.7 3.5 - 5.0 g/dL   AST 33 15 - 41 U/L   ALT 29 17 - 63 U/L   Alkaline Phosphatase 67 38 - 126 U/L   Total Bilirubin 0.2 (L) 0.3 - 1.2 mg/dL   GFR calc non Af Amer >60 >60 mL/min   GFR calc Af Amer >60 >  60 mL/min    Comment: (NOTE) The eGFR has been calculated using the CKD EPI equation. This calculation has not been validated in all clinical situations. eGFR's persistently <60 mL/min signify possible Chronic Kidney Disease.    Anion gap 8 5 - 15    Comment: Performed at [facility], [address] [place], [place] [address]  Lipase, blood     Status: Abnormal   Collection Time: 11/09/17  5:23 PM  Result Value Ref Range   Lipase 83 (H) 11 - 51 U/L    Comment: Performed at [facility], [address] [place], [place] [address]  Urinalysis, Complete w Microscopic     Status: Abnormal   Collection Time: 11/09/17  6:15 PM  Result Value Ref Range   Color, Urine COLORLESS (A) YELLOW   APPearance CLEAR (A) CLEAR   Specific Gravity, Urine 1.018 1.005 - 1.030   pH 5.0 5.0 - 8.0   Glucose, UA >=500 (A) NEGATIVE mg/dL   Hgb urine dipstick MODERATE (A) NEGATIVE   Bilirubin Urine NEGATIVE NEGATIVE   Ketones, ur NEGATIVE NEGATIVE mg/dL   Protein, ur 30 (A) NEGATIVE mg/dL   Nitrite NEGATIVE NEGATIVE   Leukocytes, UA NEGATIVE NEGATIVE   RBC / HPF 6-10 0 - 5 RBC/hpf   WBC, UA NONE SEEN 0 - 5 WBC/hpf   Bacteria, UA NONE SEEN NONE SEEN   Squamous Epithelial / LPF NONE SEEN 0 - 5    Comment: Please note change in reference range. Performed at [facility], [address] [place], [place] [address]   Blood gas, venous     Status: None   Collection Time: 11/09/17  6:15 PM  Result Value Ref Range   pH, Ven 7.30 7.250 - 7.430   pCO2, Ven 57 44.0 - 60.0 mmHg   Bicarbonate 28.0 20.0 - 28.0 mmol/L   Acid-Base Excess 0.8 0.0 - 2.0 mmol/L   Patient temperature 37.0    Collection site VEIN     Sample type VEIN     Comment: Performed at [facility], [address] [place], [place] [address]  Glucose, capillary     Status: Abnormal   Collection Time: 11/09/17  7:18 PM  Result Value Ref Range   Glucose-Capillary 565 (HH) 65 - 99 mg/dL   Comment 1 Notify RN    Comment 2 Document in Chart   Glucose, capillary     Status: Abnormal   Collection Time: 11/09/17  8:17 PM  Result Value Ref Range   Glucose-Capillary 540 (HH) 65 - 99 mg/dL  Glucose, capillary     Status: Abnormal   Collection Time: 11/09/17  9:30 PM  Result Value Ref Range   Glucose-Capillary 390 (H) 65 - 99 mg/dL  Glucose, capillary     Status: Abnormal   Collection Time: 11/09/17 10:42 PM  Result Value Ref Range   Glucose-Capillary 208 (H) 65 - 99 mg/dL  Basic metabolic panel     Status: Abnormal   Collection Time: 11/09/17 11:08 PM  Result Value Ref Range   Sodium 135 135 - 145 mmol/L   Potassium 3.3 (L) 3.5 - 5.1 mmol/L   Chloride 107 101 - 111 mmol/L   CO2 25 22 - 32 mmol/L   Glucose, Bld 172 (H) 65 - 99 mg/dL   BUN 17 6 - 20 mg/dL   Creatinine, Ser 0.93 0.61 - 1.24 mg/dL   Calcium 8.1 (L) 8.9 - 10.3 mg/dL   GFR calc non Af Amer >60 >  60 mL/min   GFR calc Af Amer >60 >60 mL/min    Comment: (NOTE) The eGFR has been calculated using the CKD EPI equation. This calculation has not been validated in all clinical situations. eGFR's persistently <60 mL/min signify possible Chronic Kidney Disease.    Anion gap 3 (L) 5 - 15    Comment: Performed at [facility], [address] [place], [place] [address]  Osmolality     Status: None   Collection Time: 11/09/17 11:08 PM  Result Value Ref Range   Osmolality 289 275 - 295 mOsm/kg    Comment: Performed at [facility], [address] [place], [place] [address]  Glucose, capillary     Status: Abnormal   Collection Time: 11/09/17 11:51 PM  Result Value Ref Range   Glucose-Capillary 123 (H) 65 - 99 mg/dL  Glucose, capillary      Status: Abnormal   Collection Time: 11/10/17  2:14 AM  Result Value Ref Range   Glucose-Capillary 208 (H) 65 - 99 mg/dL  Glucose, capillary     Status: Abnormal   Collection Time: 11/10/17  4:08 AM  Result Value Ref Range   Glucose-Capillary 247 (H) 65 - 99 mg/dL  Basic metabolic panel     Status: Abnormal   Collection Time: 11/10/17  4:46 AM  Result Value Ref Range   Sodium 135 135 - 145 mmol/L   Potassium 3.9 3.5 - 5.1 mmol/L   Chloride 108 101 - 111 mmol/L   CO2 24 22 - 32 mmol/L   Glucose, Bld 268 (H) 65 - 99 mg/dL   BUN 13 6 - 20 mg/dL   Creatinine, Ser 0.92 0.61 - 1.24 mg/dL   Calcium 8.0 (L) 8.9 - 10.3 mg/dL   GFR calc non Af Amer >60 >60 mL/min   GFR calc Af Amer >60 >60 mL/min    Comment: (NOTE) The eGFR has been calculated using the CKD EPI equation. This calculation has not been validated in all clinical situations. eGFR's persistently <60 mL/min signify possible Chronic Kidney Disease.    Anion gap 3 (L) 5 - 15    Comment: Performed at [facility], [address] [place], [place] [address]  CBC     Status: Abnormal   Collection Time: 11/10/17  4:46 AM  Result Value Ref Range   WBC 8.4 3.8 - 10.6 K/uL   RBC 3.55 (L) 4.40 - 5.90 MIL/uL   Hemoglobin 10.5 (L) 13.0 - 18.0 g/dL   HCT 29.2 (L) 40.0 - 52.0 %   MCV 82.0 80.0 - 100.0 fL   MCH 29.5 26.0 - 34.0 pg   MCHC 35.9 32.0 - 36.0 g/dL   RDW 12.6 11.5 - 14.5 %   Platelets 194 150 - 440 K/uL    Comment: Performed at [facility], [address] [place], [place] [address]  Hemoglobin A1c     Status: Abnormal   Collection Time: 11/10/17  4:46 AM  Result Value Ref Range   Hgb A1c MFr Bld 17.2 (H) 4.8 - 5.6 %    Comment: (NOTE) Pre diabetes:          5.7%-6.4% Diabetes:              >6.4% Glycemic control for   <7.0% adults with diabetes    Mean Plasma Glucose 446.94 mg/dL    Comment: Performed at [facility] [address] [place], [place] [address]  Glucose, capillary      Status: Abnormal  Collection Time: 11/10/17  5:34 AM  Result Value Ref Range   Glucose-Capillary 253 (H) 65 - 99 mg/dL  Glucose, capillary     Status: Abnormal   Collection Time: 11/10/17  6:39 AM  Result Value Ref Range   Glucose-Capillary 247 (H) 65 - 99 mg/dL  Glucose, capillary     Status: Abnormal   Collection Time: 11/10/17  7:42 AM  Result Value Ref Range   Glucose-Capillary 202 (H) 65 - 99 mg/dL  Glucose, capillary     Status: Abnormal   Collection Time: 11/10/17 11:49 AM  Result Value Ref Range   Glucose-Capillary 271 (H) 65 - 99 mg/dL  Glucose, capillary     Status: Abnormal   Collection Time: 11/10/17  4:51 PM  Result Value Ref Range   Glucose-Capillary 318 (H) 65 - 99 mg/dL    Current Facility-Administered Medications  Medication Dose Route Frequency Provider Last Rate Last Dose  . 0.9 %  sodium chloride infusion   Intravenous Continuous Amelia Jo, MD 100 mL/hr at 11/10/17 1132    . acetaminophen (TYLENOL) tablet 650 mg  650 mg Oral Q6H PRN Amelia Jo, MD       Or  . acetaminophen (TYLENOL) suppository 650 mg  650 mg Rectal Q6H PRN Amelia Jo, MD      . bisacodyl (DULCOLAX) EC tablet 5 mg  5 mg Oral Daily PRN Amelia Jo, MD      . docusate sodium (COLACE) capsule 100 mg  100 mg Oral BID Amelia Jo, MD      . feeding supplement (GLUCERNA SHAKE) (GLUCERNA SHAKE) liquid 237 mL  237 mL Oral TID BM Gladstone Lighter, MD      . FLUoxetine (PROZAC) capsule 20 mg  20 mg Oral Daily Jnyah Brazee T, MD      . heparin injection 5,000 Units  5,000 Units Subcutaneous Q8H Amelia Jo, MD   5,000 Units at 11/10/17 1247  . HYDROcodone-acetaminophen (NORCO/VICODIN) 5-325 MG per tablet 1-2 tablet  1-2 tablet Oral Q4H PRN Amelia Jo, MD   2 tablet at 11/10/17 0936  . insulin aspart (novoLOG) injection 0-5 Units  0-5 Units Subcutaneous QHS Amelia Jo, MD      . insulin aspart (novoLOG) injection 0-9 Units  0-9 Units Subcutaneous TID WC Amelia Jo, MD   7 Units  at 11/10/17 1713  . insulin glargine (LANTUS) injection 20 Units  20 Units Subcutaneous Daily Gladstone Lighter, MD   20 Units at 11/10/17 1713  . lisinopril (PRINIVIL,ZESTRIL) tablet 10 mg  10 mg Oral Daily Gladstone Lighter, MD   10 mg at 11/10/17 1712  . metoCLOPramide (REGLAN) tablet 10 mg  10 mg Oral TID Hilaria Ota, MD   10 mg at 11/10/17 1712  . morphine 2 MG/ML injection 2 mg  2 mg Intravenous Q4H PRN Gladstone Lighter, MD   2 mg at 11/10/17 1838  . multivitamin with minerals tablet 1 tablet  1 tablet Oral Daily Gladstone Lighter, MD   1 tablet at 11/10/17 1712  . ondansetron (ZOFRAN) tablet 4 mg  4 mg Oral Q6H PRN Amelia Jo, MD       Or  . ondansetron [facility]) injection 4 mg  4 mg Intravenous Q6H PRN Amelia Jo, MD   4 mg at 11/10/17 0128  . oxyCODONE (Oxy IR/ROXICODONE) immediate release tablet 5 mg  5 mg Oral Q6H PRN Gladstone Lighter, MD      . pantoprazole (PROTONIX) EC tablet 40 mg  40 mg Oral Daily Amelia Jo,  MD   40 mg at 11/10/17 0913    Musculoskeletal: Strength & Muscle Tone: within normal limits Gait & Station: normal Patient leans: N/A  Psychiatric Specialty Exam: Physical Exam  Nursing note and vitals reviewed. Constitutional: He appears well-developed and well-nourished.  HENT:  Head: Normocephalic and atraumatic.  Eyes: Pupils are equal, round, and reactive to light. Conjunctivae are normal.  Neck: Normal range of motion.  Cardiovascular: Regular rhythm and normal heart sounds.  Respiratory: Effort normal. No respiratory distress.  GI: Soft.  Musculoskeletal: Normal range of motion.  Neurological: He is alert.  Skin: Skin is warm and dry.  Psychiatric: Judgment normal. His speech is delayed. He is slowed. Cognition and memory are normal. He exhibits a depressed mood. He expresses no suicidal ideation.    Review of Systems  Constitutional: Negative.   HENT: Negative.   Eyes: Negative.   Respiratory: Negative.   Cardiovascular:  Negative.   Gastrointestinal: Negative.   Musculoskeletal: Negative.   Skin: Negative.   Neurological: Negative.   Psychiatric/Behavioral: Positive for depression. Negative for hallucinations, memory loss, substance abuse and suicidal ideas. The patient is nervous/anxious and has insomnia.     Blood pressure (!) 141/98, pulse 86, temperature 98.2 F (36.8 C), temperature source Oral, resp. rate 20, height '5\' 6"'$  (1.676 m), weight 54.7 kg (120 lb 9.6 oz), SpO2 100 %.Body mass index is 19.47 kg/m.  General Appearance: Disheveled  Eye Contact:  Minimal  Speech:  Slow  Volume:  Decreased  Mood:  Depressed  Affect:  Congruent  Thought Process:  Goal Directed  Orientation:  Full (Time, Place, and Person)  Thought Content:  Logical  Suicidal Thoughts:  No  Homicidal Thoughts:  No  Memory:  Immediate;   Fair Recent;   Fair Remote;   Fair  Judgement:  Fair  Insight:  Fair  Psychomotor Activity:  Decreased  Concentration:  Concentration: Fair  Recall:  [employer] of Knowledge:  Fair  Language:  Fair  Akathisia:  No  Handed:  Right  AIMS (if indicated):     Assets:  Desire for Improvement Housing  ADL's:  Intact  Cognition:  WNL  Sleep:        Treatment Plan Summary: Medication management and Plan 32 year old man who meets criteria for major depression moderate to severe but fortunately not with any suicidal ideation or history of suicidality.  No sign of psychosis.  I spent some time discussing depression with the patient and emphasizing how treatable this condition can be but how it is necessary to make sure you get good follow-up.  Patient is agreeable to starting fluoxetine 20 mg a day as an initial treatment for depression.  He will need follow-up outside the hospital and will be referred to go to [place].  Does not need psychiatric hospitalization at this point.  Patient agrees to the plan.  Disposition: No evidence of imminent risk to self or others at present.   Patient does not  meet criteria for psychiatric inpatient admission. Supportive therapy provided about ongoing stressors. Discussed crisis plan, support from social network, calling 911, coming to the Emergency Department, and calling Suicide Hotline.  Alethia Berthold, MD 11/10/2017 6:41 PM

## 2017-11-10 NOTE — ED Notes (Signed)
Patient transported to 126

## 2017-11-10 NOTE — Consult Note (Signed)
Pharmacy was consulted for electrolytes for this 32 year old male who presented w/ hyperglycemia, nause, vomitting, abdominal pain. K low due to insulin drip. Has become therapeutic (3.8) after replacement. Pt no longer on insulin drip. K, Mg, phos ordered for AM. Will continue to follow. No replacement needed at this time.  Ramond Dial, Pharm.D, BCPS Clinical Pharmacist

## 2017-11-10 NOTE — Clinical Social Work Note (Addendum)
Clinical Social Work Assessment  Patient Details  Name: Shawn [name] MRN: [id number] Date of Birth: 08/12/1985  Date of referral:  11/10/17               Reason for consult:  Mental Health Concerns                Permission sought to share information with:    Permission granted to share information::     Name::        Agency::     Relationship::     Contact Information:     Housing/Transportation Living arrangements for the past 2 months:  Single Family Home Source of Information:  Patient Patient Interpreter Needed:  None Criminal Activity/Legal Involvement Pertinent to Current Situation/Hospitalization:  No - Comment as needed Significant Relationships:  Dependent Children, Parents Lives with:  Parents Do you feel safe going back to the place where you live?  Yes Need for family participation in patient care:  Yes (Comment)  Care giving concerns:  Patient lives in [place] with his 84 y.o son and his father Shawn [name].    Social Worker assessment / plan:  Holiday representative (CSW) received consult for depression. CSW met with patient alone at bedside to address consult. Patient was alert and oriented X4 and was laying in the bed. CSW introduced self and explained role of CSW department. Patient reported that he lives in [place] with his father Shawn [name] and his 50 y.o son. Per patient he works at the [employer] in [place] and has [employer]. Per patient his 48 y.o son does not have health insurance however he spoke to a Development worker, community today and reported that she will help him and his son get medicaid. Patient reported that he has full custody of his son and he stays with him during the week. Per patient his son stays with his mother on the weekends. Patient reported that his son had medicaid in [place] however they moved to [facility] in November 2018 and have not been able to switch it over. Per patient when his son needs medical care he will bring him to  the ER. Patient reported that his father has a car and provides transportation. Patient denied alcohol and drug use. Patient had a positive drug screen on 07/13/17 for cannabinoid and opiate. Per patient he does use marijuana sometimes but reported that he never uses it in front of his son. Patient reported that he was on Vicodin for leg pain in January 2019. Patient reported that he has been depressed for several years. Patient reported that he is not having thoughts of hurting himself however he did state that sometimes he thinks about going to sleep and not waking up. Patient reported that he would not hurt himself because of his son. Patient reported that his son gives him the strength to keep going. Patient reported that he does not take medication for depression and does not see a Social worker. CSW provided patient with a list of [facility] outpatient mental health resources and made him aware of [facility] and [facility] in [place]. CSW encouraged patient to follow up with [facility] or [facility]. Patient stated that he would like to speak to a psychiatrist about medication for depression. Attending MD aware of above and ordered psych consult. CSW also provided patient a list of [facility] [employer]. Patient accepted resources and thanked CSW for visit. CSW will continue to follow and assist as needed.   CSW contacted Apolonio Schneiders  financial counselor who reported that she is working on the [employer] application for patient and his 17 y.o son. Per Apolonio Schneiders she told patient to gather supporting documents for her so she can submit the application.   Employment status:  [employer] information:  Managed Care PT Recommendations:  Not assessed at this time Information / Referral to community resources:  Outpatient Psychiatric Care (Comment Required)(CSW gave patient resources for [facility] and [facility]. )  Patient/Family's Response to care:  Patient accepted resources and thanked CSW for visit.   Patient/Family's  Understanding of and Emotional Response to Diagnosis, Current Treatment, and Prognosis:  Patient was very pleasant and thanked CSW for visit.   Emotional Assessment Appearance:  Appears stated age Attitude/Demeanor/Rapport:    Affect (typically observed):  Accepting, Adaptable, Quiet, Flat Orientation:  Oriented to Self, Oriented to Place, Oriented to  Time, Oriented to Situation Alcohol / Substance use:  Other(Patient denies drug and alcohol use. ) Psych involvement (Current and /or in the community):  Yes (Comment)(Psych consult pending. )  Discharge Needs  Concerns to be addressed:  No discharge needs identified Readmission within the last 30 days:  No Current discharge risk:  Other(Depression ) Barriers to Discharge:  Continued Medical Work up   [employer], [employer], LCSW 11/10/2017, 11:11 AM

## 2017-11-10 NOTE — Care Management Note (Addendum)
Case Management Note  Patient Details  Name: Shawn [name] MRN: [id number] Date of Birth: 10/27/1985  Subjective/Objective:    Admitted to [facility] with the diagnosis of Type 2 diabetes. Lives with father, Shawn [name] [phone]). "Has not seen a primary care physician in many years." Works at [employer] on [employer] since November 2018. States he has [employer]. And gave the cards to emergency room clerk last presentation.  States he has been buying his insulin at [facility] for $30.00 - Insulin 70/30. States he has hah any insulin in 3 days because he dropped and broke the bottle. Takes care of all basic activities of daily living himself, doesn't drive.  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                  Action/Plan: Development worker, community completed Medicaid application. Medication Management and [facility] Application given. [name]'s telephone # [phone]   Expected Discharge Date:  11/11/17               Expected Discharge Plan:     In-House Referral:     Discharge planning Services     Post Acute Care Choice:    Choice offered to:     DME Arranged:    DME Agency:     HH Arranged:    HH Agency:     Status of Service:     If discussed at [employer], dates discussed:    Additional Comments:  [name], RN MSN CCM Care Management [phone] 11/10/2017, 10:38 AM

## 2017-11-10 NOTE — ED Notes (Signed)
Report given to [facility], pt admitted to 126 via stretcher.

## 2017-11-10 NOTE — Plan of Care (Signed)
Pt c/o of epigastric pain, norco and morphine given with improvement.

## 2017-11-10 NOTE — Progress Notes (Signed)
Initial Nutrition Assessment  DOCUMENTATION CODES:   Severe malnutrition in context of chronic illness  INTERVENTION:  Recommend liberalizing diet to just carbohydrate modified.  Provide Glucerna Shake po TID, each supplement provides 220 kcal and 10 grams of protein.  Provide daily MVI.  Encouraged adequate intake of calories and protein at meals. Discussed small, frequent meals throughout the day.  NUTRITION DIAGNOSIS:   Severe Malnutrition related to chronic illness(DM type 1, recurrent N/V and abdominal pain, IBS) as evidenced by severe fat depletion, severe muscle depletion.  GOAL:   Patient will meet greater than or equal to 90% of their needs  MONITOR:   PO intake, Supplement acceptance, Labs, Weight trends, I & O's  REASON FOR ASSESSMENT:   Malnutrition Screening Tool    ASSESSMENT:   32 year old male with PMHx of DM type 1, GERD, IBS, depression who presented with abdominal pain and N/V found to have DKA, hyponatremia, possible acute gastroenteritis.   Met with patient at bedside. He reports he has had a poor appetite for the past week in setting of N/V and abdominal pain. He has only been eating bites of gelatin and soup. He reports his appetite is starting to pick up while he is here. He reports he has these symptoms occasionally so over the past year he has had recurrent episodes when he could not eat well. When he is feeling well he typically has 2 meals per day and a snack. He eats chicken, eggs, beans, potatoes. He does not currently take an ONS. He is lactose-intolerant but reports he still occasionally drinks milk and develops diarrhea afterwards. Since [employer] does have lactose, will try to avoid using this.  Patient reports his UBW was 150 lbs in summer 2018. That is a reported weight loss of 30 lbs (20% body weight) over one year, which is significant for time frame. No weight in chart from that time.   Medications reviewed and include: Colace,  Novolog 0-9 units TID, Novolog 0-5 units QHS, Lantus 20 units daily, Reglan 10 mg TID, pantoprazole, NS @ 100 mL/hr.  Labs reviewed: CBG 123-278, Anion gap 3, HgbA1c 17.2.  NUTRITION - FOCUSED PHYSICAL EXAM:    Most Recent Value  Orbital Region  Severe depletion  Upper Arm Region  Severe depletion  Thoracic and Lumbar Region  Moderate depletion  Buccal Region  Severe depletion  Temple Region  Severe depletion  Clavicle Bone Region  Severe depletion  Clavicle and Acromion Bone Region  Severe depletion  Scapular Bone Region  Severe depletion  Dorsal Hand  Moderate depletion  Patellar Region  Severe depletion  Anterior Thigh Region  Severe depletion  Posterior Calf Region  Moderate depletion  Edema (RD Assessment)  None  Hair  Reviewed  Eyes  Reviewed  Mouth  Reviewed  Skin  Reviewed  Nails  Reviewed     Diet Order:   Diet Order           Diet heart healthy/carb modified Room service appropriate? Yes; Fluid consistency: Thin  Diet effective now          EDUCATION NEEDS:   Education needs have been addressed  Skin:  Skin Assessment: Reviewed RN Assessment  Last BM:  11/10/2017 - no BM characteristics documented  Height:   Ht Readings from Last 1 Encounters:  11/10/17 _0  (1.676 m)    Weight:   Wt Readings from Last 1 Encounters:  11/10/17 120 lb 9.6 oz (54.7 kg)    Ideal Body Weight:  64.5 kg  BMI:  Body mass index is 19.47 kg/m.  Estimated Nutritional Needs:   Kcal:  1885-2175 (MSJ x 1.3-1.5)  Protein:  80-95 grams (1.5-1.7 grams/kg)  Fluid:  1.6-1.9 L/day (30-35 mL/kg)  Willey Blade, MS, RD, LDN Office: [phone] Pager: [phone] After Hours/Weekend Pager: [phone]

## 2017-11-10 NOTE — Progress Notes (Signed)
[facility] [place] NAME: Shawn [name]    MR#:  [id number]  DATE OF BIRTH:  August 09, 1985  SUBJECTIVE:  CHIEF COMPLAINT:   Chief Complaint  Patient presents with  . Abdominal Pain  . Emesis  . Diarrhea  . Hyperglycemia   -Sugars have improved.  Off insulin drip. -still complains of abdominal pain, nausea and vomiting - admit of being depressed- no suicidal or homicidal ideations  REVIEW OF SYSTEMS:  Review of Systems  Constitutional: Positive for malaise/fatigue. Negative for chills and fever.  HENT: Negative for congestion, ear discharge, hearing loss and nosebleeds.   Respiratory: Negative for cough, shortness of breath and wheezing.   Cardiovascular: Negative for chest pain, palpitations and leg swelling.  Gastrointestinal: Positive for abdominal pain and nausea. Negative for constipation, diarrhea and vomiting.  Genitourinary: Negative for dysuria.  Musculoskeletal: Negative for myalgias.  Neurological: Negative for dizziness, focal weakness, seizures, weakness and headaches.  Psychiatric/Behavioral: Positive for depression.    DRUG ALLERGIES:   Allergies  Allergen Reactions  . Banana Hives, Nausea And Vomiting and Rash  . Keflex [Cephalexin] Anaphylaxis  . Onion Hives, Nausea And Vomiting and Rash  . Sulfa Antibiotics Anaphylaxis  . Grapeseed Extract [Nutritional Supplements] Itching    VITALS:  Blood pressure (!) 141/99, pulse 93, temperature 98.4 F (36.9 C), temperature source Oral, resp. rate 18, height 5\' 6"  (1.676 m), weight 54.7 kg (120 lb 9.6 oz), SpO2 100 %.  PHYSICAL EXAMINATION:  Physical Exam  GENERAL:  32 y.o.-year-old ill nourished patient lying in the bed with no acute distress.  EYES: Pupils equal, round, reactive to light and accommodation. No scleral icterus. Extraocular muscles intact.  HEENT: Head atraumatic, normocephalic. Oropharynx and nasopharynx clear.  NECK:  Supple, no jugular venous  distention. No thyroid enlargement, no tenderness.  LUNGS: Normal breath sounds bilaterally, no wheezing, rales,rhonchi or crepitation. No use of accessory muscles of respiration.  CARDIOVASCULAR: S1, S2 normal. No murmurs, rubs, or gallops.  ABDOMEN: Soft, minimal tenderness in mid abdomen, nondistended. Bowel sounds present. No organomegaly or mass.  EXTREMITIES: No pedal edema, cyanosis, or clubbing.  NEUROLOGIC: Cranial nerves II through XII are intact. Muscle strength 5/5 in all extremities. Sensation intact. Gait not checked.  PSYCHIATRIC: The patient is alert and oriented x 3.  SKIN: No obvious rash, lesion, or ulcer.    LABORATORY PANEL:   CBC Recent Labs  Lab 11/10/17 0446  WBC 8.4  HGB 10.5*  HCT 29.2*  PLT 194   ------------------------------------------------------------------------------------------------------------------  Chemistries  Recent Labs  Lab 11/09/17 1723  11/10/17 0446  NA 124*   < > 135  K 4.7   < > 3.9  CL 91*   < > 108  CO2 25   < > 24  GLUCOSE 844*   < > 268*  BUN 21*   < > 13  CREATININE 1.31*   < > 0.92  CALCIUM 8.5*   < > 8.0*  AST 33  --   --   ALT 29  --   --   ALKPHOS 67  --   --   BILITOT 0.2*  --   --    < > = values in this interval not displayed.   ------------------------------------------------------------------------------------------------------------------  Cardiac Enzymes No results for input(s): TROPONINI in the last 168 hours. ------------------------------------------------------------------------------------------------------------------  RADIOLOGY:  No results found.  EKG:   Orders placed or performed during the hospital encounter of 07/13/17  . ED EKG  .  ED EKG  . EKG 12-Lead  . EKG 12-Lead  . EKG    ASSESSMENT AND PLAN:   32 year old male with past medical history significant for type 1 diabetes mellitus, GERD, IBS since to hospital secondary to abdominal pain, nausea and vomiting  1. Diabetic  Ketoacidosis- patient has type 1 DM, wasn't complaint with insulin at home -Required IV insulin drip.  Anion gap has closed and sugars have improved.  Not sure why he did not get his Lantus prior to stopping the drip -Started on Lantus and pre-meal insulin.  Also sliding scale insulin -Appreciate diabetes coordinator's input -Hba1c is 17.2  2.  Hyponatremia-likely hypovolemia and also pseudohyponatremia from hyperglycemia -Improved with IV fluids  3.  Abdominal pain with nausea vomiting-could be acute gastroenteritis. -Able to tolerate diet.  If no improvement, will consider ordering imaging studies  4.  GERD-continue Protonix  5.  DVT prophylaxis-subcutaneous heparin  6.  Hypertension-we will add lisinopril    All the records are reviewed and case discussed with Care Management/Social Workerr. Management plans discussed with the patient, family and they are in agreement.  CODE STATUS: Full Code  TOTAL TIME TAKING CARE OF THIS PATIENT: 39 minutes.   POSSIBLE D/C IN 1-2 DAYS, DEPENDING ON CLINICAL CONDITION.   Gladstone Lighter M.D on 11/10/2017 at 1:24 PM  Between 7am to 6pm - Pager - [phone]  After 6pm go to [email or web address] - password [email or web address] [facility]  Office  [phone]  CC: Primary care physician; Patient, No Pcp Per

## 2017-11-11 LAB — BASIC METABOLIC PANEL
Anion gap: 5 (ref 5–15)
BUN: 16 mg/dL (ref 6–20)
CHLORIDE: 107 mmol/L (ref 101–111)
CO2: 25 mmol/L (ref 22–32)
Calcium: 8.4 mg/dL — ABNORMAL LOW (ref 8.9–10.3)
Creatinine, Ser: 0.95 mg/dL (ref 0.61–1.24)
GFR calc Af Amer: >60 mL/min (ref >60)
GLUCOSE: 151 mg/dL — AB (ref 65–99)
POTASSIUM: 3.9 mmol/L (ref 3.5–5.1)
Sodium: 137 mmol/L (ref 135–145)

## 2017-11-11 LAB — MAGNESIUM: Magnesium: 1.6 mg/dL — ABNORMAL LOW (ref 1.7–2.4)

## 2017-11-11 LAB — GLUCOSE, CAPILLARY
GLUCOSE-CAPILLARY: 157 mg/dL — AB (ref 65–99)
Glucose-Capillary: 153 mg/dL — ABNORMAL HIGH (ref 65–99)

## 2017-11-11 LAB — PHOSPHORUS: Phosphorus: 2.5 mg/dL (ref 2.5–4.6)

## 2017-11-11 MED ORDER — LISINOPRIL 10 MG PO TABS: 10.0000 mg | ORAL_TABLET | Freq: Every day | ORAL | 2 refills | Status: DC

## 2017-11-11 MED ORDER — METOCLOPRAMIDE HCL 10 MG PO TABS: 10.0000 mg | ORAL_TABLET | Freq: Three times a day (TID) | ORAL | 2 refills | Status: DC

## 2017-11-11 MED ORDER — MAGNESIUM SULFATE 2 GM/50ML IV SOLN
2.0000 g | Freq: Once | INTRAVENOUS | Status: AC
  Administered 2017-11-11: 08:00:00 2 g via INTRAVENOUS
  Filled 2017-11-11: qty 50

## 2017-11-11 MED ORDER — INSULIN GLARGINE 100 UNIT/ML ~~LOC~~ SOLN: 22.0000 [IU] | Freq: Every day | SUBCUTANEOUS | 11 refills | Status: DC

## 2017-11-11 MED ORDER — TRAMADOL HCL 50 MG PO TABS: 50.0000 mg | ORAL_TABLET | Freq: Four times a day (QID) | ORAL | 0 refills | Status: AC | PRN

## 2017-11-11 MED ORDER — PROMETHAZINE HCL 12.5 MG PO TABS: 12.5000 mg | ORAL_TABLET | Freq: Four times a day (QID) | ORAL | 0 refills | Status: DC | PRN

## 2017-11-11 MED ORDER — PROMETHAZINE HCL 25 MG/ML IJ SOLN
25.0000 mg | Freq: Once | INTRAMUSCULAR | Status: AC
  Administered 2017-11-11: 08:00:00 25 mg via INTRAVENOUS
  Filled 2017-11-11: qty 1

## 2017-11-11 MED ORDER — FLUOXETINE HCL 20 MG PO CAPS: 20.0000 mg | ORAL_CAPSULE | Freq: Every day | ORAL | 3 refills | Status: DC

## 2017-11-11 NOTE — Consult Note (Signed)
Pharmacy was consulted for electrolytes for this 32 year old male who presented w/ hyperglycemia, nause, vomitting, abdominal pain. K low due to insulin drip. Has become therapeutic (3.9) after replacement. Pt no longer on insulin drip. Mg low this AM at 1.6. 2g IV already ordered this AM. Pharmacy will sign off. Please reconsult if needed  Ramond Dial, Pharm.D, BCPS Clinical Pharmacist

## 2017-11-11 NOTE — Progress Notes (Addendum)
Inpatient Diabetes Program Recommendations  AACE/ADA: New Consensus Statement on Inpatient Glycemic Control (2015)  Target Ranges:  Prepandial:   less than 140 mg/dL      Peak postprandial:   less than 180 mg/dL (1-2 hours)      Critically ill patients:  140 - 180 mg/dL   Results for Shawn [name], Shawn [name] (MRN [id number]) as of 11/11/2017 10:34  Ref. Range 11/10/2017 07:42 11/10/2017 11:49 11/10/2017 16:51 11/10/2017 21:30 11/11/2017 07:42  Glucose-Capillary Latest Ref Range: 65 - 99 mg/dL 202 (H) 271 (H) 318 (H) 276 (H) 153 (H)   Review of Glycemic Control  Diabetes history: DM1 (makes NO insulin; requires insulin for basal, correction, and carbohydrates consumed) Outpatient Diabetes medications: 70/30 15 units BID Current orders for Inpatient glycemic control: Lantus 20 units daily, Novolog 0-9 units TID with meals  Inpatient Diabetes Program Recommendations: Correction (SSI): Please consider ordering Novolog 0-5 units QHS. Insulin - Meal Coverage: Patient has Type 1 diabetes and makes NO insulin. Therefore, he will require insulin coverage for carbohydrates he comsumes. Please order Novolog 3 units TID with meals for meal coverage if patient eats at least 50% of meals. HgbA1C: A1C 17.2% on 11/10/17 indicating an average glucose of 447 mg/dl over the past 2-3 months. Note patient has major depression which is impacting ability to self manage DM. Patient needs to see an Endocrinologist to assist with improving DM control.  Addendum 11/11/17@11 :45-Spoke with patient about diabetes and home regimen for diabetes control. Patient was dx with Type 40 DM at 32 years old, he works at [employer] (1st and 3rd shift), recently got insurance, and struggles to financially be able to purchase insulin from [facility]. Patient states that he is taking Novolin 70/30 15 units BID at home and checking glucose 2-3 times per day with [employer] glucometer and testing supplies. Patient states that he is taking insulin consistently and  notes that he dropped his Novolin 70/30 insulin vial about 3 days ago and broke it so he has been without insulin since then. Patient states that he was having to wait until he got paid before he could go purchase another bottle of insulin. Reviewed basic pathophysiology of DM1 and explained that he has to have insulin consistently or he will go into DKA. Patient is not seeing any providers regularly as he is managing his DM himself with insulin from [facility] which is over the counter. In talking with patient further he reports that he sometimes forgets to take his insulin with his work schedule and he also reports that he sometimes skips taking the insulin if his glucose is in a good range or if he is not going to eat. Discussed 70/30 insulin in detail and explained that if he checks his glucose and it is in goal range that is likely because the insulin is working to keep it there. Explained that when he skips an insulin dose, he does not have any insulin working consistently over 24 hours and glucose likely increases significantly. Discussed why basal insulin is needed even when not eating and explained that if he wasn't eating he may consider using Novolin NPH instead of the 70/30 insulin. However, patient states that he struggles to afford one bottle of insulin at times. Asked that he take 70/30 insulin BID consistently and try to eat at least BID.  Discussed A1C results (17.2% on 11/10/17) and explained that his current A1C indicates an average glucose of 447 mg/dl over the past 2-3 months. Discussed glucose and A1C goals. Discussed  importance of checking CBGs and maintaining good CBG control to prevent long-term and short-term complications. Explained how hyperglycemia leads to damage within blood vessels which lead to the common complications seen with uncontrolled diabetes. Stressed to the patient the importance of improving glycemic control to prevent further complications from uncontrolled diabetes.  Emphasized the danger and increased of complications from uncontrolled DM if he doesn't get DM controled. Encouraged patient to utilize the resources in the community ([facility] and Medication Management) and asked that establish care with a provider that he can follow up with regularly. Also, encouraged patient to find a local Endocrinologist to assist with DM control.   Patient verbalized understanding of information discussed and he states that he has no further questions at this time related to diabetes.  Thanks, [name], RN, MSN, CDE Diabetes Coordinator Inpatient Diabetes Program [phone] (Team Pager from 8am to 5pm)

## 2017-11-11 NOTE — Progress Notes (Signed)
     Rayhan Groleau was admitted to the [facility] on 11/09/2017 for an acute medical condition and is being Discharged on  11/11/2017 . He will need another 2 days for recovery and so advised to stay away from work until then. So please excuse him/her from work for the above  Days. Should be able to return to work without any restrictions from 11/14/17.  Call Gladstone Lighter  MD, [facility] at  [phone] with questions.  Gladstone Lighter M.D on 11/11/2017,at 12:46 PM  [facility] [address], [place] [address]

## 2017-11-11 NOTE — Progress Notes (Signed)
Pt is being discharged today, all discharge instructions reviewed with the patient and he verified understanding. IV x2 were removed and all belongings were packed and returned to the patient. He refused a wheelchair and walked out to his vehicle.

## 2017-11-11 NOTE — Discharge Summary (Signed)
[facility] [place] NAME: Shawn [name]    MR#:  [id number]  DATE OF BIRTH:  07-12-1985  DATE OF ADMISSION:  11/09/2017   ADMITTING PHYSICIAN: Amelia Jo, MD  DATE OF DISCHARGE: 11/11/2017  1:27 PM  PRIMARY CARE PHYSICIAN: Patient, No Pcp Per   ADMISSION DIAGNOSIS:   Dehydration [E86.0] Hyperglycemia [R73.9] Non-intractable vomiting with nausea, unspecified vomiting type [R11.2]  DISCHARGE DIAGNOSIS:   Principal Problem:   Moderate recurrent major depression (HCC) Active Problems:   Type 2 diabetes mellitus with hyperosmolar nonketotic hyperglycemia ([place])   SECONDARY DIAGNOSIS:   Past Medical History:  Diagnosis Date  . Diabetes mellitus without complication ([place])   . GERD (gastroesophageal reflux disease)   . IBS (irritable bowel syndrome)     HOSPITAL COURSE:   32 year old male with past medical history significant for type 1 diabetes mellitus, GERD, IBS since to hospital secondary to abdominal pain, nausea and vomiting  1. Diabetic Ketoacidosis- patient has type 1 DM, wasn't complaint with insulin at home -Required IV insulin drip.  Anion gap has closed and sugars have improved. -Started on Lantus with decent control of sugars -Appreciate diabetes coordinator's input -Hba1c is 17.2 -Counseled about the importance of compliance to medication  2.  Hyponatremia-likely hypovolemia and also pseudohyponatremia from hyperglycemia -Improved with IV fluids  3.  Abdominal pain with nausea vomiting-could be acute gastroenteritis. -And also has chronic abdominal pain. -Able to tolerate diet. -Started on Reglan prior to meals-  4.  Hypertension- started lisinopril for renal protection  5.  Depression-appreciate psych consult in the hospital.  Started on Prozac.  Patient is not homicidal on suicidal   DISCHARGE CONDITIONS:   Guarded  CONSULTS OBTAINED:   Treatment Team:  Clapacs, Madie Reno, MD  DRUG ALLERGIES:     Allergies  Allergen Reactions  . Banana Hives, Nausea And Vomiting and Rash  . Keflex [Cephalexin] Anaphylaxis  . Onion Hives, Nausea And Vomiting and Rash  . Sulfa Antibiotics Anaphylaxis  . Grapeseed Extract [Nutritional Supplements] Itching   DISCHARGE MEDICATIONS:   Allergies as of 11/11/2017      Reactions   Banana Hives, Nausea And Vomiting, Rash   Keflex [cephalexin] Anaphylaxis   Onion Hives, Nausea And Vomiting, Rash   Sulfa Antibiotics Anaphylaxis   Grapeseed Extract [nutritional Supplements] Itching      Medication List    STOP taking these medications   furosemide 20 MG tablet Commonly known as:  LASIX   insulin NPH-regular Human (70-30) 100 UNIT/ML injection Commonly known as:  NOVOLIN 70/30   ondansetron 4 MG disintegrating tablet Commonly known as:  ZOFRAN-ODT   oxyCODONE-acetaminophen 5-325 MG tablet Commonly known as:  ROXICET     TAKE these medications   FLUoxetine 20 MG capsule Commonly known as:  PROZAC Take 1 capsule (20 mg total) by mouth daily. Start taking on:  11/12/2017   insulin glargine 100 UNIT/ML injection Commonly known as:  LANTUS Inject 0.22 mLs (22 Units total) into the skin daily.   lisinopril 10 MG tablet Commonly known as:  PRINIVIL,ZESTRIL Take 1 tablet (10 mg total) by mouth daily. Start taking on:  11/12/2017   metoCLOPramide 10 MG tablet Commonly known as:  REGLAN Take 1 tablet (10 mg total) by mouth 3 (three) times daily before meals.   promethazine 12.5 MG tablet Commonly known as:  PHENERGAN Take 1 tablet (12.5 mg total) by mouth every 6 (six) hours as needed for nausea or vomiting.   traMADol  50 MG tablet Commonly known as:  ULTRAM Take 1 tablet (50 mg total) by mouth every 6 (six) hours as needed for up to 5 days for moderate pain or severe pain.        DISCHARGE INSTRUCTIONS:   1. PCP f/u in 1 week  DIET:   Diabetic diet  ACTIVITY:   Activity as tolerated  OXYGEN:   Home Oxygen: No.   Oxygen Delivery: room air  DISCHARGE LOCATION:   home   If you experience worsening of your admission symptoms, develop shortness of breath, life threatening emergency, suicidal or homicidal thoughts you must seek medical attention immediately by calling 911 or calling your MD immediately  if symptoms less severe.  You Must read complete instructions/literature along with all the possible adverse reactions/side effects for all the Medicines you take and that have been prescribed to you. Take any new Medicines after you have completely understood and accpet all the possible adverse reactions/side effects.   Please note  You were cared for by a hospitalist during your hospital stay. If you have any questions about your discharge medications or the care you received while you were in the hospital after you are discharged, you can call the unit and asked to speak with the hospitalist on call if the hospitalist that took care of you is not available. Once you are discharged, your primary care physician will handle any further medical issues. Please note that NO REFILLS for any discharge medications will be authorized once you are discharged, as it is imperative that you return to your primary care physician (or establish a relationship with a primary care physician if you do not have one) for your aftercare needs so that they can reassess your need for medications and monitor your lab values.    On the day of Discharge:  VITAL SIGNS:   Blood pressure (!) 144/97, pulse 85, temperature 98.7 F (37.1 C), temperature source Oral, resp. rate (!) 22, height 5\' 6"  (1.676 m), weight 54.4 kg (120 lb), SpO2 100 %.  PHYSICAL EXAMINATION:   GENERAL:  33 y.o.-year-old ill nourished patient lying in the bed with no acute distress.  EYES: Pupils equal, round, reactive to light and accommodation. No scleral icterus. Extraocular muscles intact.  HEENT: Head atraumatic, normocephalic. Oropharynx and nasopharynx  clear.  NECK:  Supple, no jugular venous distention. No thyroid enlargement, no tenderness.  LUNGS: Normal breath sounds bilaterally, no wheezing, rales,rhonchi or crepitation. No use of accessory muscles of respiration.  CARDIOVASCULAR: S1, S2 normal. No murmurs, rubs, or gallops.  ABDOMEN: Soft, minimal tenderness in mid abdomen, nondistended. Bowel sounds present. No organomegaly or mass.  EXTREMITIES: No pedal edema, cyanosis, or clubbing.  NEUROLOGIC: Cranial nerves II through XII are intact. Muscle strength 5/5 in all extremities. Sensation intact. Gait not checked.  PSYCHIATRIC: The patient is alert and oriented x 3.  SKIN: No obvious rash, lesion, or ulcer.    DATA REVIEW:   CBC Recent Labs  Lab 11/10/17 0446  WBC 8.4  HGB 10.5*  HCT 29.2*  PLT 194    Chemistries  Recent Labs  Lab 11/09/17 1723  11/11/17 0500  NA 124*   < > 137  K 4.7   < > 3.9  CL 91*   < > 107  CO2 25   < > 25  GLUCOSE 844*   < > 151*  BUN 21*   < > 16  CREATININE 1.31*   < > 0.95  CALCIUM 8.5*   < >  8.4*  MG  --   --  1.6*  AST 33  --   --   ALT 29  --   --   ALKPHOS 67  --   --   BILITOT 0.2*  --   --    < > = values in this interval not displayed.     Microbiology Results  No results found for this or any previous visit.  RADIOLOGY:  No results found.   Management plans discussed with the patient, family and they are in agreement.  CODE STATUS:     Code Status Orders  (From admission, onward)        Start     Ordered   11/10/17 0049  Full code  Continuous     11/10/17 0049    Code Status History    Date Active Date Inactive Code Status Order ID Comments User Context   07/13/2017 2112 07/15/2017 1730 Full Code [id number]  Jules Husbands, MD ED      TOTAL TIME TAKING CARE OF THIS PATIENT: 38 minutes.    Gladstone Lighter M.D on 11/11/2017 at 2:57 PM  Between 7am to 6pm - Pager - [phone]  After 6pm go to [email or web address] - Proofreader  [employer]  [facility]  Office  [phone]  CC: Primary care physician; Patient, No Pcp Per   Note: This dictation was prepared with Dragon dictation along with smaller phrase technology. Any transcriptional errors that result from this process are unintentional.

## 2017-12-18 ENCOUNTER — Emergency Department: Payer: BLUE CROSS/BLUE SHIELD

## 2017-12-18 ENCOUNTER — Other Ambulatory Visit: Payer: Self-pay

## 2017-12-18 ENCOUNTER — Encounter: Payer: Self-pay | Admitting: Emergency Medicine

## 2017-12-18 ENCOUNTER — Inpatient Hospital Stay
Admission: EM | Admit: 2017-12-18 | Discharge: 2017-12-27 | DRG: 854 | Disposition: A | Discharge status: Home / Self Care | Payer: BLUE CROSS/BLUE SHIELD | Attending: Internal Medicine | Admitting: Internal Medicine

## 2017-12-18 DIAGNOSIS — Z8041 Family history of malignant neoplasm of ovary: Secondary | ICD-10-CM

## 2017-12-18 DIAGNOSIS — L03115 Cellulitis of right lower limb: Secondary | ICD-10-CM | POA: Diagnosis not present

## 2017-12-18 DIAGNOSIS — D62 Acute posthemorrhagic anemia: Secondary | ICD-10-CM | POA: Diagnosis not present

## 2017-12-18 DIAGNOSIS — Z833 Family history of diabetes mellitus: Secondary | ICD-10-CM

## 2017-12-18 DIAGNOSIS — L02611 Cutaneous abscess of right foot: Secondary | ICD-10-CM | POA: Diagnosis present

## 2017-12-18 DIAGNOSIS — F419 Anxiety disorder, unspecified: Secondary | ICD-10-CM | POA: Diagnosis present

## 2017-12-18 DIAGNOSIS — N179 Acute kidney failure, unspecified: Secondary | ICD-10-CM | POA: Diagnosis present

## 2017-12-18 DIAGNOSIS — R112 Nausea with vomiting, unspecified: Secondary | ICD-10-CM

## 2017-12-18 DIAGNOSIS — I96 Gangrene, not elsewhere classified: Secondary | ICD-10-CM | POA: Diagnosis present

## 2017-12-18 DIAGNOSIS — I10 Essential (primary) hypertension: Secondary | ICD-10-CM | POA: Diagnosis present

## 2017-12-18 DIAGNOSIS — E11628 Type 2 diabetes mellitus with other skin complications: Secondary | ICD-10-CM | POA: Diagnosis present

## 2017-12-18 DIAGNOSIS — E1165 Type 2 diabetes mellitus with hyperglycemia: Secondary | ICD-10-CM | POA: Diagnosis present

## 2017-12-18 DIAGNOSIS — F329 Major depressive disorder, single episode, unspecified: Secondary | ICD-10-CM | POA: Diagnosis present

## 2017-12-18 DIAGNOSIS — A419 Sepsis, unspecified organism: Secondary | ICD-10-CM | POA: Diagnosis present

## 2017-12-18 DIAGNOSIS — L97519 Non-pressure chronic ulcer of other part of right foot with unspecified severity: Secondary | ICD-10-CM | POA: Diagnosis present

## 2017-12-18 DIAGNOSIS — K219 Gastro-esophageal reflux disease without esophagitis: Secondary | ICD-10-CM | POA: Diagnosis present

## 2017-12-18 DIAGNOSIS — M869 Osteomyelitis, unspecified: Secondary | ICD-10-CM | POA: Diagnosis present

## 2017-12-18 DIAGNOSIS — Z79899 Other long term (current) drug therapy: Secondary | ICD-10-CM

## 2017-12-18 DIAGNOSIS — B9561 Methicillin susceptible Staphylococcus aureus infection as the cause of diseases classified elsewhere: Secondary | ICD-10-CM | POA: Diagnosis present

## 2017-12-18 DIAGNOSIS — E11621 Type 2 diabetes mellitus with foot ulcer: Secondary | ICD-10-CM | POA: Diagnosis present

## 2017-12-18 DIAGNOSIS — K580 Irritable bowel syndrome with diarrhea: Secondary | ICD-10-CM | POA: Diagnosis present

## 2017-12-18 DIAGNOSIS — D638 Anemia in other chronic diseases classified elsewhere: Secondary | ICD-10-CM | POA: Diagnosis present

## 2017-12-18 DIAGNOSIS — Z794 Long term (current) use of insulin: Secondary | ICD-10-CM

## 2017-12-18 DIAGNOSIS — E860 Dehydration: Secondary | ICD-10-CM | POA: Diagnosis present

## 2017-12-18 DIAGNOSIS — I700 Atherosclerosis of aorta: Secondary | ICD-10-CM | POA: Diagnosis present

## 2017-12-18 DIAGNOSIS — Z87892 Personal history of anaphylaxis: Secondary | ICD-10-CM

## 2017-12-18 DIAGNOSIS — Z880 Allergy status to penicillin: Secondary | ICD-10-CM

## 2017-12-18 DIAGNOSIS — E1169 Type 2 diabetes mellitus with other specified complication: Secondary | ICD-10-CM | POA: Diagnosis present

## 2017-12-18 DIAGNOSIS — Z9181 History of falling: Secondary | ICD-10-CM

## 2017-12-18 DIAGNOSIS — E1152 Type 2 diabetes mellitus with diabetic peripheral angiopathy with gangrene: Secondary | ICD-10-CM | POA: Diagnosis present

## 2017-12-18 DIAGNOSIS — Z881 Allergy status to other antibiotic agents status: Secondary | ICD-10-CM

## 2017-12-18 DIAGNOSIS — I70238 Atherosclerosis of native arteries of right leg with ulceration of other part of lower right leg: Secondary | ICD-10-CM | POA: Diagnosis not present

## 2017-12-18 DIAGNOSIS — N289 Disorder of kidney and ureter, unspecified: Secondary | ICD-10-CM

## 2017-12-18 DIAGNOSIS — E1151 Type 2 diabetes mellitus with diabetic peripheral angiopathy without gangrene: Secondary | ICD-10-CM | POA: Diagnosis present

## 2017-12-18 DIAGNOSIS — E871 Hypo-osmolality and hyponatremia: Secondary | ICD-10-CM | POA: Diagnosis present

## 2017-12-18 DIAGNOSIS — L089 Local infection of the skin and subcutaneous tissue, unspecified: Secondary | ICD-10-CM

## 2017-12-18 DIAGNOSIS — Z882 Allergy status to sulfonamides status: Secondary | ICD-10-CM

## 2017-12-18 DIAGNOSIS — Z91018 Allergy to other foods: Secondary | ICD-10-CM

## 2017-12-18 LAB — URINALYSIS, COMPLETE (UACMP) WITH MICROSCOPIC
BACTERIA UA: NONE SEEN
Bilirubin Urine: NEGATIVE
Glucose, UA: >=500 mg/dL — AB
KETONES UR: NEGATIVE mg/dL
Leukocytes, UA: NEGATIVE
Nitrite: NEGATIVE
Protein, ur: 100 mg/dL — AB
Specific Gravity, Urine: 1.014 (ref 1.005–1.030)
pH: 5 (ref 5.0–8.0)

## 2017-12-18 LAB — CBC WITH DIFFERENTIAL/PLATELET
BASOS PCT: 0 %
Basophils Absolute: 0.1 10*3/uL (ref 0–0.1)
Basophils Absolute: 0 10*3/uL (ref 0–0.1)
Basophils Relative: 0 %
EOS PCT: 0 %
Eosinophils Absolute: 0.1 10*3/uL (ref 0–0.7)
Eosinophils Absolute: 0 10*3/uL (ref 0–0.7)
Eosinophils Relative: 0 %
HEMATOCRIT: 32.7 % — AB (ref 40.0–52.0)
HEMATOCRIT: 26.4 % — AB (ref 40.0–52.0)
Hemoglobin: 11.5 g/dL — ABNORMAL LOW (ref 13.0–18.0)
Hemoglobin: 8.9 g/dL — ABNORMAL LOW (ref 13.0–18.0)
LYMPHS ABS: 1.4 10*3/uL (ref 1.0–3.6)
LYMPHS PCT: 7 %
Lymphocytes Relative: 5 %
Lymphs Abs: 1.1 10*3/uL (ref 1.0–3.6)
MCH: 28.8 pg (ref 26.0–34.0)
MCH: 28.3 pg (ref 26.0–34.0)
MCHC: 35.3 g/dL (ref 32.0–36.0)
MCHC: 33.9 g/dL (ref 32.0–36.0)
MCV: 81.8 fL (ref 80.0–100.0)
MCV: 83.5 fL (ref 80.0–100.0)
MONOS PCT: 9 %
Monocytes Absolute: 2 10*3/uL — ABNORMAL HIGH (ref 0.2–1.0)
Monocytes Absolute: 2.2 10*3/uL — ABNORMAL HIGH (ref 0.2–1.0)
Monocytes Relative: 11 %
NEUTROS ABS: 19.7 10*3/uL — AB (ref 1.4–6.5)
Neutro Abs: 16 10*3/uL — ABNORMAL HIGH (ref 1.4–6.5)
Neutrophils Relative %: 86 %
Neutrophils Relative %: 82 %
PLATELETS: 224 10*3/uL (ref 150–440)
Platelets: 298 10*3/uL (ref 150–440)
RBC: 4 MIL/uL — ABNORMAL LOW (ref 4.40–5.90)
RBC: 3.16 MIL/uL — ABNORMAL LOW (ref 4.40–5.90)
RDW: 12.8 % (ref 11.5–14.5)
RDW: 13.4 % (ref 11.5–14.5)
WBC: 23 10*3/uL — ABNORMAL HIGH (ref 3.8–10.6)
WBC: 19.6 10*3/uL — ABNORMAL HIGH (ref 3.8–10.6)

## 2017-12-18 LAB — COMPREHENSIVE METABOLIC PANEL
ALBUMIN: 3.1 g/dL — AB (ref 3.5–5.0)
ALK PHOS: 84 U/L (ref 38–126)
ALT: 13 U/L — ABNORMAL LOW (ref 17–63)
ALT: 12 U/L — ABNORMAL LOW (ref 17–63)
ANION GAP: 11 (ref 5–15)
AST: 16 U/L (ref 15–41)
AST: 19 U/L (ref 15–41)
Albumin: 2.6 g/dL — ABNORMAL LOW (ref 3.5–5.0)
Alkaline Phosphatase: 67 U/L (ref 38–126)
Anion gap: 9 (ref 5–15)
BUN: 30 mg/dL — ABNORMAL HIGH (ref 6–20)
BUN: 30 mg/dL — AB (ref 6–20)
CHLORIDE: 92 mmol/L — AB (ref 101–111)
CHLORIDE: 99 mmol/L — AB (ref 101–111)
CO2: 27 mmol/L (ref 22–32)
CO2: 24 mmol/L (ref 22–32)
CREATININE: 1.21 mg/dL (ref 0.61–1.24)
Calcium: 9.2 mg/dL (ref 8.9–10.3)
Calcium: 8.2 mg/dL — ABNORMAL LOW (ref 8.9–10.3)
Creatinine, Ser: 1.39 mg/dL — ABNORMAL HIGH (ref 0.61–1.24)
GFR calc non Af Amer: >60 mL/min (ref >60)
GFR calc non Af Amer: >60 mL/min (ref >60)
GLUCOSE: 129 mg/dL — AB (ref 65–99)
Glucose, Bld: 85 mg/dL (ref 65–99)
POTASSIUM: 3.7 mmol/L (ref 3.5–5.1)
Potassium: 4.2 mmol/L (ref 3.5–5.1)
SODIUM: 130 mmol/L — AB (ref 135–145)
Sodium: 132 mmol/L — ABNORMAL LOW (ref 135–145)
Total Bilirubin: 0.6 mg/dL (ref 0.3–1.2)
Total Bilirubin: 0.5 mg/dL (ref 0.3–1.2)
Total Protein: 7.6 g/dL (ref 6.5–8.1)
Total Protein: 6.4 g/dL — ABNORMAL LOW (ref 6.5–8.1)

## 2017-12-18 LAB — GLUCOSE, CAPILLARY
Glucose-Capillary: 67 mg/dL (ref 65–99)
Glucose-Capillary: 137 mg/dL — ABNORMAL HIGH (ref 65–99)

## 2017-12-18 LAB — PROTIME-INR
INR: 1
Prothrombin Time: 13.1 seconds (ref 11.4–15.2)

## 2017-12-18 LAB — PROCALCITONIN: PROCALCITONIN: 3.62 ng/mL

## 2017-12-18 LAB — BLOOD GAS, VENOUS
Acid-Base Excess: 7.2 mmol/L — ABNORMAL HIGH (ref 0.0–2.0)
Bicarbonate: 33.4 mmol/L — ABNORMAL HIGH (ref 20.0–28.0)
O2 SAT: UNDETERMINED %
Patient temperature: 37
pCO2, Ven: 54 mmHg (ref 44.0–60.0)
pH, Ven: 7.4 (ref 7.250–7.430)

## 2017-12-18 LAB — APTT: APTT: 35 s (ref 24–36)

## 2017-12-18 LAB — LACTIC ACID, PLASMA
LACTIC ACID, VENOUS: 1 mmol/L (ref 0.5–1.9)
LACTIC ACID, VENOUS: 1 mmol/L (ref 0.5–1.9)
Lactic Acid, Venous: 0.6 mmol/L (ref 0.5–1.9)

## 2017-12-18 LAB — SEDIMENTATION RATE: Sed Rate: 118 mm/hr — ABNORMAL HIGH (ref 0–15)

## 2017-12-18 MED ORDER — ONDANSETRON HCL 4 MG/2ML IJ SOLN
4.0000 mg | Freq: Once | INTRAMUSCULAR | Status: AC
  Administered 2017-12-18: 4 mg via INTRAVENOUS
  Filled 2017-12-18: qty 2

## 2017-12-18 MED ORDER — INSULIN ASPART 100 UNIT/ML ~~LOC~~ SOLN
0.0000 [IU] | Freq: Three times a day (TID) | SUBCUTANEOUS | Status: DC
  Administered 2017-12-19 – 2017-12-20 (×3): 2 [IU] via SUBCUTANEOUS
  Administered 2017-12-21: 18:00:00 3 [IU] via SUBCUTANEOUS
  Administered 2017-12-21: 08:00:00 5 [IU] via SUBCUTANEOUS
  Administered 2017-12-21: 7 [IU] via SUBCUTANEOUS
  Administered 2017-12-22: 08:00:00 2 [IU] via SUBCUTANEOUS
  Administered 2017-12-22 (×2): 3 [IU] via SUBCUTANEOUS
  Administered 2017-12-23 (×2): 2 [IU] via SUBCUTANEOUS
  Administered 2017-12-24: 7 [IU] via SUBCUTANEOUS
  Administered 2017-12-24: 3 [IU] via SUBCUTANEOUS
  Administered 2017-12-25: 5 [IU] via SUBCUTANEOUS
  Administered 2017-12-25: 3 [IU] via SUBCUTANEOUS
  Administered 2017-12-25: 13:00:00 5 [IU] via SUBCUTANEOUS
  Administered 2017-12-26: 09:00:00 3 [IU] via SUBCUTANEOUS
  Administered 2017-12-26: 7 [IU] via SUBCUTANEOUS
  Administered 2017-12-26 – 2017-12-27 (×2): 3 [IU] via SUBCUTANEOUS
  Administered 2017-12-27: 08:00:00 2 [IU] via SUBCUTANEOUS
  Filled 2017-12-18 (×22): qty 1

## 2017-12-18 MED ORDER — VANCOMYCIN HCL IN DEXTROSE 1-5 GM/200ML-% IV SOLN
1000.0000 mg | Freq: Once | INTRAVENOUS | Status: AC
Start: 2017-12-18 — End: 2017-12-18
  Administered 2017-12-18: 1000 mg via INTRAVENOUS
  Filled 2017-12-18: qty 200

## 2017-12-18 MED ORDER — SODIUM CHLORIDE 0.9 % IV SOLN
INTRAVENOUS | Status: AC
  Administered 2017-12-18 (×2): via INTRAVENOUS

## 2017-12-18 MED ORDER — HYDROMORPHONE HCL 1 MG/ML IJ SOLN
0.5000 mg | Freq: Once | INTRAMUSCULAR | Status: AC
  Administered 2017-12-18: 0.5 mg via INTRAVENOUS
  Filled 2017-12-18: qty 1

## 2017-12-18 MED ORDER — TRAMADOL HCL 50 MG PO TABS
50.0000 mg | ORAL_TABLET | Freq: Four times a day (QID) | ORAL | Status: DC | PRN
  Filled 2017-12-18 (×2): qty 1

## 2017-12-18 MED ORDER — SODIUM CHLORIDE 0.9 % IV BOLUS
1000.0000 mL | Freq: Once | INTRAVENOUS | Status: AC
  Administered 2017-12-18: 1000 mL via INTRAVENOUS

## 2017-12-18 MED ORDER — FLUOXETINE HCL 20 MG PO CAPS
20.0000 mg | ORAL_CAPSULE | Freq: Every day | ORAL | Status: DC
  Administered 2017-12-18 – 2017-12-27 (×10): 20 mg via ORAL
  Filled 2017-12-18 (×11): qty 1

## 2017-12-18 MED ORDER — INSULIN ASPART 100 UNIT/ML ~~LOC~~ SOLN: 0.0000 [IU] | Freq: Every day | SUBCUTANEOUS | Status: DC

## 2017-12-18 MED ORDER — INSULIN ASPART 100 UNIT/ML ~~LOC~~ SOLN
0.0000 [IU] | Freq: Every day | SUBCUTANEOUS | Status: DC
  Administered 2017-12-19 – 2017-12-21 (×3): 3 [IU] via SUBCUTANEOUS
  Administered 2017-12-22: 21:00:00 2 [IU] via SUBCUTANEOUS
  Administered 2017-12-23: 3 [IU] via SUBCUTANEOUS
  Administered 2017-12-24: 4 [IU] via SUBCUTANEOUS
  Filled 2017-12-18 (×6): qty 1

## 2017-12-18 MED ORDER — MORPHINE SULFATE (PF) 2 MG/ML IV SOLN
2.0000 mg | INTRAVENOUS | Status: DC | PRN
Start: 2017-12-18 — End: 2017-12-27
  Administered 2017-12-18 – 2017-12-27 (×24): 2 mg via INTRAVENOUS
  Filled 2017-12-18 (×27): qty 1

## 2017-12-18 MED ORDER — INSULIN ASPART 100 UNIT/ML ~~LOC~~ SOLN: 0.0000 [IU] | Freq: Three times a day (TID) | SUBCUTANEOUS | Status: DC

## 2017-12-18 MED ORDER — VANCOMYCIN HCL IN DEXTROSE 1-5 GM/200ML-% IV SOLN: 1000.0000 mg | Freq: Once | INTRAVENOUS | Status: DC

## 2017-12-18 MED ORDER — INSULIN GLARGINE 100 UNIT/ML ~~LOC~~ SOLN
22.0000 [IU] | Freq: Every day | SUBCUTANEOUS | Status: DC
  Administered 2017-12-18 – 2017-12-22 (×4): 22 [IU] via SUBCUTANEOUS
  Filled 2017-12-18 (×5): qty 0.22

## 2017-12-18 MED ORDER — METOCLOPRAMIDE HCL 5 MG PO TABS
10.0000 mg | ORAL_TABLET | Freq: Three times a day (TID) | ORAL | Status: DC
  Administered 2017-12-18 – 2017-12-27 (×21): 10 mg via ORAL
  Filled 2017-12-18 (×21): qty 2

## 2017-12-18 MED ORDER — ACETAMINOPHEN 325 MG PO TABS
650.0000 mg | ORAL_TABLET | Freq: Once | ORAL | Status: AC
  Administered 2017-12-18: 650 mg via ORAL
  Filled 2017-12-18: qty 2

## 2017-12-18 MED ORDER — ONDANSETRON HCL 4 MG/2ML IJ SOLN
4.0000 mg | Freq: Four times a day (QID) | INTRAMUSCULAR | Status: DC | PRN
  Administered 2017-12-23 – 2017-12-24 (×3): 4 mg via INTRAVENOUS
  Filled 2017-12-18 (×2): qty 2

## 2017-12-18 MED ORDER — VANCOMYCIN HCL IN DEXTROSE 1-5 GM/200ML-% IV SOLN
1000.0000 mg | INTRAVENOUS | Status: DC
  Administered 2017-12-18: 1000 mg via INTRAVENOUS
  Filled 2017-12-18 (×2): qty 200

## 2017-12-18 NOTE — ED Notes (Signed)
ED Provider at bedside. 

## 2017-12-18 NOTE — ED Notes (Signed)
No areas of drainage noted to right foot wound to culture.

## 2017-12-18 NOTE — ED Triage Notes (Signed)
Injured rigth foot 2 days ago when his dog tripped him.  Has redness to top of foot and 4th and 5th toes.

## 2017-12-18 NOTE — ED Notes (Signed)
Pt has redness and swelling to right foot, PA student marked with skin marker the area of redness.  Pt states it has been like this for 2 days, c/o pain 10/10.  Pt is alert and oriented at this time.  Foot elevated on 2 pillows to help with swelling.

## 2017-12-18 NOTE — Progress Notes (Addendum)
Pharmacy Antibiotic Note  Shawn [name] is a 32 y.o. male admitted on 12/18/2017 with cellulitis.  Pharmacy has been consulted for vancomycin dosing. He was recently discharged from [facility] after being treated for DKA. His Scr is modestly elevated on this admission with a baseline near 1   Plan: Vancomycin 1000mg  IV every 18 hours following 1000mg  in the ED. Stacked dose calculated at 10h  Goal trough 15-20 mcg/mL. Vt will be scheduled prior to the 5th dose for now  Ke: 0.055, Vd: 36.5 L, T1/2: 12.5 h, calculated steady-state concentration: 66mcg/mL  Height: 5\' 6"  (167.6 cm) Weight: 115 lb (52.2 kg) IBW/kg (Calculated) : 63.8  Temp (24hrs), Avg:100.6 F (38.1 C), Min:100.6 F (38.1 C), Max:100.6 F (38.1 C)  Recent Labs  Lab 12/18/17 1221 12/18/17 1255  WBC 23.0*  --   CREATININE 1.39*  --   LATICACIDVEN  --  1.0    Estimated Creatinine Clearance: 57.4 mL/min (A) (by C-G formula based on SCr of 1.39 mg/dL (H)).    Allergies  Allergen Reactions  . Banana Hives, Nausea And Vomiting and Rash  . Keflex [Cephalexin] Anaphylaxis  . Onion Hives, Nausea And Vomiting and Rash  . Sulfa Antibiotics Anaphylaxis  . Grapeseed Extract [Nutritional Supplements] Itching    Antimicrobials this admission: Vancomycin 6/16 >>   Microbiology results: 6/16 BCx: pending  Thank you for allowing pharmacy to be a part of this patient's care.  Dallie Piles, PharmD 12/18/2017 1:53 PM

## 2017-12-18 NOTE — Progress Notes (Signed)
CODE SEPSIS - PHARMACY COMMUNICATION  **Broad Spectrum Antibiotics should be administered within 1 hour of Sepsis diagnosis**  Time Code Sepsis Called/Page Received: 12:52  Antibiotics Ordered: vancomycin  Time of 1st antibiotic administration: 13:36  Additional action taken by pharmacy:  13:24 Spoke with ED RN Caryl Pina - abx due by 13:52  If necessary, Name of Provider/Nurse Contacted:     Laural Benes, Pharm.D., BCPS Clinical Pharmacist  12/18/2017  12:52 PM

## 2017-12-18 NOTE — H&P (Signed)
[facility] [place] NAME: Shawn [name]    MR#:  [id number]  DATE OF BIRTH:  [birth year]  DATE OF ADMISSION:  12/18/2017  PRIMARY CARE PHYSICIAN: Patient, No Pcp Per   REQUESTING/REFERRING PHYSICIAN: Mariea Clonts  CHIEF COMPLAINT:  Right foot injury  HISTORY OF PRESENT ILLNESS:  Paton Crum  is a 32 y.o. male with a known history of insulin requiring diabetes metas, hypertension, irritable bowel syndrome sustained a fall 2 days ago and presenting to the ED today with right foot redness, swelling and pain.  Since patient tripped over his dog and fell 2 days ago patient was having pain and swelling which has been gradually getting worse.  Discharge was noticed from the abrasion from the plantar aspect of the foot on fourth and fifth toes.  Patient is started on IV antibiotics after obtaining cultures and IV fluids were started  PAST MEDICAL HISTORY:   Past Medical History:  Diagnosis Date  . Diabetes mellitus without complication ([place])   . GERD (gastroesophageal reflux disease)   . IBS (irritable bowel syndrome)     PAST SURGICAL HISTOIRY:  History reviewed. No pertinent surgical history.  SOCIAL HISTORY:   Social History   Tobacco Use  . Smoking status: Never Smoker  . Smokeless tobacco: Never Used  Substance Use Topics  . Alcohol use: No    Frequency: Never    FAMILY HISTORY:   Family History  Problem Relation Age of Onset  . Diabetes Mother   . Ovarian cancer Mother     DRUG ALLERGIES:   Allergies  Allergen Reactions  . Banana Hives, Nausea And Vomiting and Rash  . Keflex [Cephalexin] Anaphylaxis  . Onion Hives, Nausea And Vomiting and Rash  . Sulfa Antibiotics Anaphylaxis  . Grapeseed Extract [Nutritional Supplements] Itching    REVIEW OF SYSTEMS:  CONSTITUTIONAL:  fever, no fatigue or weakness.  EYES: No blurred or double vision.  EARS, NOSE, AND THROAT: No tinnitus or ear pain.  RESPIRATORY: No cough,  shortness of breath, wheezing or hemoptysis.  CARDIOVASCULAR: No chest pain, orthopnea, edema.  GASTROINTESTINAL: No nausea, vomiting, diarrhea or abdominal pain.  GENITOURINARY: No dysuria, hematuria.  ENDOCRINE: No polyuria, nocturia,  HEMATOLOGY: No anemia, easy bruising or bleeding SKIN: No rash or lesion. MUSCULOSKELETAL: Right   lateral aspect of the foot swollen, erythematous and painful NEUROLOGIC: No tingling, numbness, weakness.  PSYCHIATRY: No anxiety or depression.   MEDICATIONS AT HOME:   Prior to Admission medications   Medication Sig Start Date End Date Taking? Authorizing Provider  FLUoxetine (PROZAC) 20 MG capsule Take 1 capsule (20 mg total) by mouth daily. 11/12/17  Yes Gladstone Lighter, MD  insulin glargine (LANTUS) 100 UNIT/ML injection Inject 0.22 mLs (22 Units total) into the skin daily. Patient taking differently: Inject 30 Units into the skin daily.  11/11/17  Yes Gladstone Lighter, MD  lisinopril (PRINIVIL,ZESTRIL) 10 MG tablet Take 1 tablet (10 mg total) by mouth daily. 11/12/17  Yes Gladstone Lighter, MD  metoCLOPramide (REGLAN) 10 MG tablet Take 1 tablet (10 mg total) by mouth 3 (three) times daily before meals. 11/11/17  Yes Gladstone Lighter, MD  promethazine (PHENERGAN) 12.5 MG tablet Take 1 tablet (12.5 mg total) by mouth every 6 (six) hours as needed for nausea or vomiting. Patient not taking: Reported on 12/18/2017 11/11/17   Gladstone Lighter, MD      VITAL SIGNS:  Blood pressure 101/63, pulse (!) 104, temperature 99.7 F (37.6 C), temperature source Oral,  resp. rate 17, height 5\' 6"  (1.676 m), weight 52.2 kg (115 lb), SpO2 97 %.  PHYSICAL EXAMINATION:  GENERAL:  32 y.o.-year-old patient lying in the bed with no acute distress.  EYES: Pupils equal, round, reactive to light and accommodation. No scleral icterus. Extraocular muscles intact.  HEENT: Head atraumatic, normocephalic. Oropharynx and nasopharynx clear.  NECK:  Supple, no jugular venous  distention. No thyroid enlargement, no tenderness.  LUNGS: Normal breath sounds bilaterally, no wheezing, rales,rhonchi or crepitation. No use of accessory muscles of respiration.  CARDIOVASCULAR: S1, S2 normal. No murmurs, rubs, or gallops.  ABDOMEN: Soft, nontender, nondistended. Bowel sounds present. No organomegaly or mass.  EXTREMITIES: Shawn [name]  is a 32 y.o. male with a known history of insulin requiring diabetes metas, hypertension, irritable bowel syndrome sustained a fall 2 days ago and presenting to the ED today with right foot redness, swelling and pain.  Since patient tripped over his dog and fell 2 days ago patient was having pain and swelling which has been gradually getting worse.  Discharge was noticed from the abrasion from the plantar aspect of the foot on fourth and fifth toes. No pedal edema, cyanosis, or clubbing.  NEUROLOGIC: Cranial nerves II through XII are intact. Muscle strength 5/5 in all extremities. Sensation intact. Gait not checked.  PSYCHIATRIC: The patient is alert and oriented x 3.  SKIN: No obvious rash, lesion, or ulcer.   LABORATORY PANEL:   CBC Recent Labs  Lab 12/18/17 1221  WBC 23.0*  HGB 11.5*  HCT 32.7*  PLT 298   ------------------------------------------------------------------------------------------------------------------  Chemistries  Recent Labs  Lab 12/18/17 1221  NA 130*  K 4.2  CL 92*  CO2 27  GLUCOSE 129*  BUN 30*  CREATININE 1.39*  CALCIUM 9.2  AST 16  ALT 13*  ALKPHOS 84  BILITOT 0.6   ------------------------------------------------------------------------------------------------------------------  Cardiac Enzymes No results for input(s): TROPONINI in the last 168 hours. ------------------------------------------------------------------------------------------------------------------  RADIOLOGY:  Dg Foot Complete Right  Result Date: 12/18/2017 CLINICAL DATA:  Injury, pain and redness of 4th and 5th toes  EXAM: RIGHT FOOT COMPLETE - 3+ VIEW COMPARISON:  None. FINDINGS: Soft tissue swelling over the distal right lateral foot and 5th toe. No acute fracture, subluxation or dislocation. Joint spaces are maintained. IMPRESSION: No acute bony abnormality. Electronically Signed   By: Rolm Baptise M.D.   On: 12/18/2017 13:16    EKG:   Orders placed or performed during the hospital encounter of 12/18/17  . ED EKG 12-Lead  . ED EKG 12-Lead  . EKG 12-Lead  . EKG 12-Lead    IMPRESSION AND PLAN:   Avery Klingbeil  is a 32 y.o. male with a known history of insulin requiring diabetes metas, hypertension, irritable bowel syndrome sustained a fall 2 days ago and presenting to the ED today with right foot redness, swelling and pain.  Since patient tripped over his dog and fell 2 days ago patient was having pain and swelling which has been gradually getting worse.  Discharge was noticed from the abrasion from the plantar aspect of the foot on fourth and fifth toes.    #Sepsis secondary to right diabetic foot infection Admit to [place] unit Patient meets septic criteria at the time of admission with fever, tachycardia and a leukocytosis Patient is started on IV antibiotics cultures were obtained Hydrate with IV fluids.  Lactic acid is not elevated  #.  Foot injury with diabetic foot infection Admit to MedSurg unit wound culture and sensitivity IV vancomycin Surgery  consult if no improvement  #Hyponatremia from dehydration- Hydrate with IV fluids and recheck labs in a.m.  #AKI Avoid nephrotoxins provide IV fluids and monitor renal function closely recheck labs in a.m.   #Insulin requiring diabetes mellitus continue Lantus and sliding scale insulin   #Essential hypertension lisinopril on hold in view of AKI     DVT prophylaxis with Lovenox subcu    All the records are reviewed and case discussed with ED provider. Management plans discussed with the patient, family and they are in  agreement.  CODE STATUS: fc   TOTAL TIME TAKING CARE OF THIS PATIENT: 41 minutes.   Note: This dictation was prepared with Dragon dictation along with smaller phrase technology. Any transcriptional errors that result from this process are unintentional.  Nicholes Mango M.D on 12/18/2017 at 3:08 PM  Between 7am to 6pm - Pager - [phone]  After 6pm go to [email or web address] - password [email or web address] [facility]  Office  [phone]  CC: Primary care physician; Patient, No Pcp Per

## 2017-12-18 NOTE — ED Provider Notes (Signed)
[facility] Emergency Department Provider Note  ____________________________________________  Time seen: Approximately 12:51 PM  I have reviewed the triage vital signs and the nursing notes.   HISTORY  Chief Complaint Foot Injury    HPI Shawn [name] is a 32 y.o. male a history of DM, presenting with right foot erythema, swelling and pain after falling.  The patient reports that 2 nights ago, he tripped over his dog and fell onto his right foot.  Since then, he has developed significant pain with swelling, he abrasion on the plantar aspect of the foot below the fifth digit that has had some clear discharge.  He has also developed fever and several episodes of nausea and vomiting.  His blood sugars have been running high, in the 2 and 300s, since this started.  He has not tried anything for his symptoms.  Past Medical History:  Diagnosis Date  . Diabetes mellitus without complication ([place])   . GERD (gastroesophageal reflux disease)   . IBS (irritable bowel syndrome)     Patient Active Problem List   Diagnosis Date Noted  . Moderate recurrent major depression ([place]) 11/10/2017  . Type 2 diabetes mellitus with hyperosmolar nonketotic hyperglycemia ([place]) 11/09/2017  . History of migraine 07/15/2017  . IBS (irritable bowel syndrome) 07/15/2017  . Protein-calorie malnutrition, severe 07/14/2017  . Abdominal pain 07/13/2017  . GERD (gastroesophageal reflux disease) 12/30/2009  . Diabetes ([place]) 11/25/2009  . MDD (major depressive disorder) 11/25/2009  . Hypercholesterolemia 03/07/2008    History reviewed. No pertinent surgical history.  Current Outpatient Rx  . Order #: [id number] Class: Print  . Order #: [id number] Class: Print  . Order #: [id number] Class: Print  . Order #: [id number] Class: Print  . Order #: [id number] Class: Print    Allergies Banana; Keflex [cephalexin]; Onion; Sulfa antibiotics; and Grapeseed extract [nutritional supplements]  Family  History  Problem Relation Age of Onset  . Diabetes Mother   . Ovarian cancer Mother     Social History Social History   Tobacco Use  . Smoking status: Never Smoker  . Smokeless tobacco: Never Used  Substance Use Topics  . Alcohol use: No    Frequency: Never  . Drug use: No    Review of Systems Constitutional positive fever.  Positive general malaise. Eyes: No visual changes. ENT: No sore throat. No congestion or rhinorrhea. Cardiovascular: Denies chest pain. Denies palpitations. Respiratory: Denies shortness of breath.  No cough. Gastrointestinal: No abdominal pain.  Positive nausea, positive vomiting.  No diarrhea.  No constipation. Genitourinary: Negative for dysuria. Musculoskeletal: Negative for back pain.  Positive for right foot abrasion, erythema, swelling, pain, and discharge. Skin: Negative for rash. Neurological: Negative for headaches. Endocrine: + hyperglycemia    ____________________________________________   PHYSICAL EXAM:  VITAL SIGNS: ED Triage Vitals  Enc Vitals Group     BP 12/18/17 1215 118/75     Pulse Rate 12/18/17 1215 (!) 136     Resp 12/18/17 1215 18     Temp 12/18/17 1215 (!) 100.6 F (38.1 C)     Temp Source 12/18/17 1215 Oral     SpO2 12/18/17 1215 97 %     Weight 12/18/17 1215 115 lb (52.2 kg)     Height 12/18/17 1215 5\' 6"  (1.676 m)     Head Circumference --      Peak Flow --      Pain Score 12/18/17 1204 10     Pain Loc --      Pain Edu? --  Excl. in [place]? --     Constitutional: Alert and oriented. Answers questions appropriately.  Patient is uncomfortable appearing and meet sepsis criteria. Eyes: Conjunctivae are normal.  EOMI. No scleral icterus. Head: Atraumatic. Nose: No congestion/rhinnorhea. Mouth/Throat: Mucous membranes are moist.  Poor dentition. Neck: No stridor.  Supple.  No meningismus. Cardiovascular: Fast rate, regular rhythm. No murmurs, rubs or gallops.  Respiratory: Normal respiratory effort.  No  accessory muscle use or retractions. Lungs CTAB.  No wheezes, rales or ronchi. Gastrointestinal: Soft, nontender and nondistended.  No guarding or rebound.  No peritoneal signs. Musculoskeletal: The patient has a 1 x 1.5 cm abrasion on the plantar aspect of the right foot that has some clear discharge.  He has surrounding erythema and swelling that extends to the entirety of the distal and mid foot, to the right lateral malleolus.  His DP and PT pulses are normal.  He has no evidence of lymphatic streaking, full range of motion of the right knee and hip without pain. Neurologic:  A&Ox3.  Speech is clear.  Face and smile are symmetric.  EOMI.  Moves all extremities well. Skin: See above her skin exam. Psychiatric: Mood and affect are normal. Speech and behavior are normal.  Normal judgement.  ____________________________________________   LABS (all labs ordered are listed, but only abnormal results are displayed)  Labs Reviewed  COMPREHENSIVE METABOLIC PANEL - Abnormal; Notable for the following components:      Result Value   Sodium 130 (*)    Chloride 92 (*)    Glucose, Bld 129 (*)    BUN 30 (*)    Creatinine, Ser 1.39 (*)    Albumin 3.1 (*)    ALT 13 (*)    All other components within normal limits  CBC WITH DIFFERENTIAL/PLATELET - Abnormal; Notable for the following components:   WBC 23.0 (*)    RBC 4.00 (*)    Hemoglobin 11.5 (*)    HCT 32.7 (*)    Neutro Abs 19.7 (*)    Monocytes Absolute 2.0 (*)    All other components within normal limits  BLOOD GAS, VENOUS - Abnormal; Notable for the following components:   Bicarbonate 33.4 (*)    Acid-Base Excess 7.2 (*)    All other components within normal limits  CULTURE, BLOOD (ROUTINE X 2)  CULTURE, BLOOD (ROUTINE X 2)  URINALYSIS, COMPLETE (UACMP) WITH MICROSCOPIC  LACTIC ACID, PLASMA  LACTIC ACID, PLASMA  SEDIMENTATION RATE   ____________________________________________  EKG  ED ECG REPORT I, Eula Listen, the  attending physician, personally viewed and interpreted this ECG.   Date: 12/18/2017  EKG Time: 1315  Rate: 118  Rhythm: normal sinus rhythm  Axis: normal  Intervals:none  ST&T Change: No Stemi  ____________________________________________  RADIOLOGY  Dg Foot Complete Right  Result Date: 12/18/2017 CLINICAL DATA:  Injury, pain and redness of 4th and 5th toes EXAM: RIGHT FOOT COMPLETE - 3+ VIEW COMPARISON:  None. FINDINGS: Soft tissue swelling over the distal right lateral foot and 5th toe. No acute fracture, subluxation or dislocation. Joint spaces are maintained. IMPRESSION: No acute bony abnormality. Electronically Signed   By: Rolm Baptise M.D.   On: 12/18/2017 13:16    ____________________________________________   PROCEDURES  Procedure(s) performed: None  Procedures  Critical Care performed: Yes, see critical care note(s) ____________________________________________   INITIAL IMPRESSION / ASSESSMENT AND PLAN / ED COURSE  Pertinent labs & imaging results that were available during my care of the patient were reviewed by me and considered  in my medical decision making (see chart for details).  32 y.o. male, diabetic patient, presenting with right foot swelling, erythema, pain and discharge with systemic symptoms.  Overall, this patient does meet sepsis criteria.  I am concerned about a severe cellulitis, and possible osteomyelitis although the time course would be very quick for this.  We will also get plain x-rays to rule gas, necrotizing fasciitis.  Patient will receive Tylenol for his fever, vancomycin for his infection, and Dilaudid and Zofran for his pain and nausea.  His laboratory studies today are concerning for hyperglycemia without DKA.  I have reviewed the patient's chart and his blood sugars range from the 1-800s in the past.  He does have an acute renal insufficiency and a hyponatremia.  The patient will require admission for continued evaluation and  treatment.  ----------------------------------------- 1:30 PM on 12/18/2017 ----------------------------------------- The patient continues to be tachycardic and have pain, he is receiving intravenous fluids and we will re-dose his pain medication.  He has received his antibiotics.  His x-ray does not show any evidence of bony destruction but does show some soft tissue swelling.  He does have some hyponatremia with a sodium of 130 today and acute renal insufficiency.  At this time, the patient will be admitted to the hospitalist for continued evaluation and treatment.   CRITICAL CARE Performed by: Eula Listen   Total critical care time: 35 minutes  Critical care time was exclusive of separately billable procedures and treating other patients.  Critical care was necessary to treat or prevent imminent or life-threatening deterioration.  Critical care was time spent personally by me on the following activities: development of treatment plan with patient and/or surrogate as well as nursing, discussions with consultants, evaluation of patient's response to treatment, examination of patient, obtaining history from patient or surrogate, ordering and performing treatments and interventions, ordering and review of laboratory studies, ordering and review of radiographic studies, pulse oximetry and re-evaluation of patient's condition.   ____________________________________________  FINAL CLINICAL IMPRESSION(S) / ED DIAGNOSES  Final diagnoses:  Right foot infection  Sepsis, due to unspecified organism ([place])  Non-intractable vomiting with nausea, unspecified vomiting type  Hyponatremia  Acute renal insufficiency         NEW MEDICATIONS STARTED DURING THIS VISIT:  New Prescriptions   No medications on file      Eula Listen, MD 12/18/17 1500

## 2017-12-19 ENCOUNTER — Inpatient Hospital Stay: Payer: BLUE CROSS/BLUE SHIELD

## 2017-12-19 LAB — BASIC METABOLIC PANEL
Anion gap: 7 (ref 5–15)
BUN: 28 mg/dL — AB (ref 6–20)
CHLORIDE: 103 mmol/L (ref 101–111)
CO2: 24 mmol/L (ref 22–32)
CREATININE: 1.3 mg/dL — AB (ref 0.61–1.24)
Calcium: 7.9 mg/dL — ABNORMAL LOW (ref 8.9–10.3)
GFR calc Af Amer: >60 mL/min (ref >60)
GFR calc non Af Amer: >60 mL/min (ref >60)
Glucose, Bld: 162 mg/dL — ABNORMAL HIGH (ref 65–99)
Potassium: 3.9 mmol/L (ref 3.5–5.1)
Sodium: 134 mmol/L — ABNORMAL LOW (ref 135–145)

## 2017-12-19 LAB — CBC
HCT: 24.1 % — ABNORMAL LOW (ref 40.0–52.0)
Hemoglobin: 8.2 g/dL — ABNORMAL LOW (ref 13.0–18.0)
MCH: 28.4 pg (ref 26.0–34.0)
MCHC: 34.2 g/dL (ref 32.0–36.0)
MCV: 83 fL (ref 80.0–100.0)
Platelets: 220 10*3/uL (ref 150–440)
RBC: 2.9 MIL/uL — ABNORMAL LOW (ref 4.40–5.90)
RDW: 12.9 % (ref 11.5–14.5)
WBC: 17.1 10*3/uL — ABNORMAL HIGH (ref 3.8–10.6)

## 2017-12-19 LAB — GLUCOSE, CAPILLARY
GLUCOSE-CAPILLARY: 153 mg/dL — AB (ref 65–99)
Glucose-Capillary: 151 mg/dL — ABNORMAL HIGH (ref 65–99)
Glucose-Capillary: 88 mg/dL (ref 65–99)
Glucose-Capillary: 253 mg/dL — ABNORMAL HIGH (ref 65–99)

## 2017-12-19 LAB — HEMOGLOBIN A1C
Hgb A1c MFr Bld: 16.9 % — ABNORMAL HIGH (ref 4.8–5.6)
Mean Plasma Glucose: 438.33 mg/dL

## 2017-12-19 MED ORDER — SODIUM CHLORIDE 0.9 % IV SOLN
2.0000 g | Freq: Three times a day (TID) | INTRAVENOUS | Status: DC
  Administered 2017-12-19 – 2017-12-23 (×12): 2 g via INTRAVENOUS
  Filled 2017-12-19 (×14): qty 2

## 2017-12-19 MED ORDER — ENOXAPARIN SODIUM 40 MG/0.4ML ~~LOC~~ SOLN
40.0000 mg | SUBCUTANEOUS | Status: DC
  Administered 2017-12-20 – 2017-12-22 (×3): 40 mg via SUBCUTANEOUS
  Filled 2017-12-19 (×3): qty 0.4

## 2017-12-19 MED ORDER — VANCOMYCIN HCL IN DEXTROSE 1-5 GM/200ML-% IV SOLN
1000.0000 mg | INTRAVENOUS | Status: DC
  Administered 2017-12-19 – 2017-12-21 (×3): 1000 mg via INTRAVENOUS
  Filled 2017-12-19 (×3): qty 200

## 2017-12-19 MED ORDER — ACETAMINOPHEN 325 MG PO TABS
650.0000 mg | ORAL_TABLET | Freq: Four times a day (QID) | ORAL | Status: DC | PRN
  Administered 2017-12-20: 11:00:00 650 mg via ORAL
  Filled 2017-12-19 (×2): qty 2

## 2017-12-19 NOTE — Care Management Note (Signed)
Case Management Note  Patient Details  Name: Shawn [name] MRN: [id number] Date of Birth: [birth year]  Subjective/Objective:  Admitted to [facility] with the diagnosis of sepsis. Discharged from this facility 11/11/17. Lives with father, Shawn [name] [phone]). Still no primary care physician. States he still hasn't gotten any identification for the [facility] or [facility]. Works at [employer]. Does have [place]. Discussed that couldn't go to the [facility] or [facility] with private insurance.  Discussed [facility].  States he gets his insulin at [facility] over the counter with a previous prescription.                    Action/Plan: Still needs primary care physician. Will give information about physicians accepting new patients   Expected Discharge Date:                  Expected Discharge Plan:     In-House Referral:     Discharge planning Services     Post Acute Care Choice:    Choice offered to:     DME Arranged:    DME Agency:     HH Arranged:    HH Agency:     Status of Service:     If discussed at [employer], dates discussed:    Additional Comments:  [name], RN MSN CCM Care Management [phone] 12/19/2017, 11:13 AM

## 2017-12-19 NOTE — Consult Note (Signed)
ORTHOPAEDIC CONSULTATION  REQUESTING PHYSICIAN: Henreitta Leber, MD  Chief Complaint: Right foot infection  HPI: Shawn [name] is a 32 y.o. male who complains of worsening redness swelling and pain to his right foot for the last 2 days.  He did trip and fall couple days ago.  Noticed redness and swelling worsening over the last couple of days.  He states he has had a little bit of fever and nausea for 2 days now.  Known history of diabetes.  Past Medical History:  Diagnosis Date  . Diabetes mellitus without complication ([place])   . GERD (gastroesophageal reflux disease)   . IBS (irritable bowel syndrome)    History reviewed. No pertinent surgical history. Social History   Socioeconomic History  . Marital status: Single    Spouse name: Not on file  . Number of children: Not on file  . Years of education: Not on file  . Highest education level: Not on file  Occupational History  . Not on file  Social Needs  . Financial resource strain: Not on file  . Food insecurity:    Worry: Not on file    Inability: Not on file  . Transportation needs:    Medical: Not on file    Non-medical: Not on file  Tobacco Use  . Smoking status: Never Smoker  . Smokeless tobacco: Never Used  Substance and Sexual Activity  . Alcohol use: No    Frequency: Never  . Drug use: No  . Sexual activity: Not on file  Lifestyle  . Physical activity:    Days per week: Not on file    Minutes per session: Not on file  . Stress: Not on file  Relationships  . Social connections:    Talks on phone: Not on file    Gets together: Not on file    Attends religious service: Not on file    Active member of club or organization: Not on file    Attends meetings of clubs or organizations: Not on file    Relationship status: Not on file  Other Topics Concern  . Not on file  Social History Narrative  . Not on file   Family History  Problem Relation Age of Onset  . Diabetes Mother   . Ovarian cancer  Mother    Allergies  Allergen Reactions  . Banana Hives, Nausea And Vomiting and Rash  . Keflex [Cephalexin] Anaphylaxis  . Onion Hives, Nausea And Vomiting and Rash  . Sulfa Antibiotics Anaphylaxis  . Grapeseed Extract [Nutritional Supplements] Itching   Prior to Admission medications   Medication Sig Start Date End Date Taking? Authorizing Provider  FLUoxetine (PROZAC) 20 MG capsule Take 1 capsule (20 mg total) by mouth daily. 11/12/17  Yes Gladstone Lighter, MD  insulin glargine (LANTUS) 100 UNIT/ML injection Inject 0.22 mLs (22 Units total) into the skin daily. Patient taking differently: Inject 30 Units into the skin daily.  11/11/17  Yes Gladstone Lighter, MD  lisinopril (PRINIVIL,ZESTRIL) 10 MG tablet Take 1 tablet (10 mg total) by mouth daily. 11/12/17  Yes Gladstone Lighter, MD  metoCLOPramide (REGLAN) 10 MG tablet Take 1 tablet (10 mg total) by mouth 3 (three) times daily before meals. 11/11/17  Yes Gladstone Lighter, MD  promethazine (PHENERGAN) 12.5 MG tablet Take 1 tablet (12.5 mg total) by mouth every 6 (six) hours as needed for nausea or vomiting. Patient not taking: Reported on 12/18/2017 11/11/17   Gladstone Lighter, MD   Dg Foot Complete Right  Result  Date: 12/18/2017 CLINICAL DATA:  Injury, pain and redness of 4th and 5th toes EXAM: RIGHT FOOT COMPLETE - 3+ VIEW COMPARISON:  None. FINDINGS: Soft tissue swelling over the distal right lateral foot and 5th toe. No acute fracture, subluxation or dislocation. Joint spaces are maintained. IMPRESSION: No acute bony abnormality. Electronically Signed   By: Rolm Baptise M.D.   On: 12/18/2017 13:16    Positive ROS: All other systems have been reviewed and were otherwise negative with the exception of those mentioned in the HPI and as above.  12 point ROS was performed.  Physical Exam: General: Alert and oriented.  No apparent distress.  Vascular:  Left foot:Dorsalis Pedis:  present Posterior Tibial:  present  Right foot:  Dorsalis Pedis:  present Posterior Tibial:  present  Neuro:intact gross sensation  Derm: Left foot without ulcer or issue  Right foot with noted redness extending to the dorsal midfoot with overlying hemorrhagic bulla to the fifth MTPJ.  No drainage currently.  Suspect deep infection though.  Ortho/MS: Markedt edema to the right foot.  Marked pain with palpation and range of motion of the right lateral foot.  I reviewed the x-rays which were negative for osteo-or gas  Assessment: Diabetic foot infection right foot with cellulitis  Plan: This will need I&D and debridement of the right foot.  I would like to obtain an MRI prior to performing surgery.  We will discuss with Dr. Elvina Mattes as he takes over podiatry service this week.  For now we will make n.p.o. but if we cannot place on the schedule this evening will allow to eat and put on the schedule tomorrow possibly.  MRI has been ordered.  Nonweightbearing to the right foot.    Elesa Hacker, DPM Cell [phone]   12/19/2017 6:58 AM

## 2017-12-19 NOTE — Progress Notes (Signed)
Inpatient Diabetes Program Recommendations  AACE/ADA: New Consensus Statement on Inpatient Glycemic Control (2015)  Target Ranges:  Prepandial:   less than 140 mg/dL      Peak postprandial:   less than 180 mg/dL (1-2 hours)      Critically ill patients:  140 - 180 mg/dL   Results for Shawn [name], Shawn [name] (MRN [id number]) as of 12/19/2017 12:07  Ref. Range 12/18/2017 16:16 12/18/2017 21:06 12/19/2017 07:40  Glucose-Capillary Latest Ref Range: 65 - 99 mg/dL 67  22 units LANTUS 137 (H) 153 (H)  2 units NOVOLOG +  22 units LANTUS   Results for Shawn [name], Shawn [name] (MRN [id number]) as of 12/19/2017 12:07  Ref. Range 07/13/2017 14:23 11/10/2017 04:46 12/19/2017 03:41  Hemoglobin A1C Latest Ref Range: 4.8 - 5.6 % 16.1 (H) 17.2 (H) 16.9 (H)  Average glucose 438 mg/dl     Home DM Meds: Lantus 30 units daily  Current Orders: Lantus 22 units daily       Novolog Sensitive Correction Scale/ SSI (0-9 units) TID AC + HS    Seen by Diabetes Coordinator back on 11/11/2017.  Extensive counseling provided at that time (see note from Mathis Bud, DM Coordinator).  Current A1c of 16.9% shows no Improvement since admission in May.  Per Care Manager note from today, patient still does not have PCP.  Attained BCBS health insurance so will NOT be eligible for [facility] or [facility] with private insurance.  Pt given information about the [facility] for primary care.  Told Care Manager that he is still getting insulin at [facility] over the counter with a previous Rx.  Will attempt to speak to patient today about his elevated A1c.     --Will follow patient during hospitalization--  Wyn Quaker RN, MSN, CDE Diabetes Coordinator Inpatient Glycemic Control Team Team Pager: [phone] (8a-5p)

## 2017-12-19 NOTE — Progress Notes (Signed)
Patient ID: Shawn [name], male   DOB: May 02, 1986, 32 y.o.   MRN: [id number] Patient seen.  Still significant pain in the right foot.  Still significant erythema and edema with pain in the right foot.  Increased skin temperature.  MRI was reviewed along with the report which reveals a soft tissue abscess and infection around the right fourth and fifth metatarsal area with no clear evidence of any bone or joint involvement.  I did discuss the findings with the patient as well as the need for I&D of the right foot.  Discussed possible risks and complications including continued infection, inability to heal due to his diabetes as well as the possible need for repeat debridement and/or later extension into the bones or joints which may require amputation of some sort.  No guarantees could be given as to the outcome of the surgery.  Questions were invited and answered.  Patient elects to proceed with debridement of the infection on his right foot later this evening.  Obtain a consent form.  Patient is already n.p.o.  Plan for surgery this evening

## 2017-12-19 NOTE — H&P (View-Only) (Signed)
Patient ID: Shawn [name], male   DOB: 12-30-1985, 32 y.o.   MRN: [id number] Patient seen.  Still significant pain in the right foot.  Still significant erythema and edema with pain in the right foot.  Increased skin temperature.  MRI was reviewed along with the report which reveals a soft tissue abscess and infection around the right fourth and fifth metatarsal area with no clear evidence of any bone or joint involvement.  I did discuss the findings with the patient as well as the need for I&D of the right foot.  Discussed possible risks and complications including continued infection, inability to heal due to his diabetes as well as the possible need for repeat debridement and/or later extension into the bones or joints which may require amputation of some sort.  No guarantees could be given as to the outcome of the surgery.  Questions were invited and answered.  Patient elects to proceed with debridement of the infection on his right foot later this evening.  Obtain a consent form.  Patient is already n.p.o.  Plan for surgery this evening

## 2017-12-19 NOTE — Progress Notes (Signed)
SWOT review A1C 16.9 per MD consult Diabetes Coordinator for education and follow up. Temp 100.5 recheck requested 101.8. Will notify MD.

## 2017-12-19 NOTE — Progress Notes (Signed)
MD- Patient needs Novolog Rx at time of discharge.  Only taking Lantus at home and needs Novolog for both correction and meal coverage at home.  Please give patient a Rx for Novolog at time of discharge.  Likely needs SSI + Novolog Meal Coverage  Recommend Novolog SSI before meals: 150- 200 mg/dl- 1 unit 201-250 mg/dl- 2 units 251-300 mg/dl- 3 units 301-350 mg/dl- 4 units 351-400 mg/dl- 5 units >400 mg/dl- 6 units  +  Novolog 3 units TID with meals (DO NOT take if you do not eat a meal)     Seen by Diabetes Coordinator back on 11/11/2017.  Extensive counseling provided at that time (see note from Mathis Bud, DM Coordinator).  Current A1c of 16.9% shows no Improvement since admission in May.  Per Care Manager note from today, patient still does not have PCP.  Attained BCBS health insurance so will NOT be eligible for [facility] or [facility] with private insurance.  Pt given information about the [facility] for primary care.  Told Care Manager that he is still getting insulin at [facility] over the counter with a previous Rx.  Spoke with patient earlier this afternoon.  Patient told me he is NOT taking 70/30 insulin from [facility] any more.  Is taking Lantus 30 units daily.  Typical day includes coming home from work (works 3rd shift at [employer]) around 7:30am.  He checks his CBG, takes his 30 units of Lantus, eats a meal, and then goes to sleep.  Sleeps until 4 or 5pm.  Upon waking, pt eats a meal and then goes to work around 9pm.  Does not check his CBGs any other time of the day except for at 7:30am when he comes home from work.  Has had Type 1 diabetes since he was 64.  Used to have a primary care provider, but lost his health insurance once he became an adult.  Just recently got insurance coverage through his employer.  States his co-pays for the Lantus insulin are affordable.  Uses the vial and syringe.  Only checking CBGs once daily and states that  his once daily CBG is usually 150-200 mg/dl.  Discussed with pt that because he has Type 1 DM and does not make any insulin that he needs both basal insulin and bolus insulin (for meals and correction).  Explained how Lantus (basal insulin work) and explained how he needs Novolog insulin as well at home to correct elevated CBGs and to cover carbohydrate intake.  Explained to pt that his A1c continues to be high b/c his CBGs are continuously elevated at home due to him not being on a correct basal/bolus regimen at home.  Explained to pt that he has to have rapid-acting insulin at home to improve CBG control.  Also encouraged pt to check his CBGs at least BID at home when he eats a meal (patient stated he only usually eats twice a day- when he gets home from work and before he leaves for work).    --Will follow patient during hospitalization--  Wyn Quaker RN, MSN, CDE Diabetes Coordinator Inpatient Glycemic Control Team Team Pager: [phone] (8a-5p)

## 2017-12-19 NOTE — Progress Notes (Signed)
[place] [facility] [place] NAME: Shawn [name]    MR#:  [id number]  DATE OF BIRTH:  11/28/1985  SUBJECTIVE:   Patient here due to right foot swelling and pain and noted to have a right diabetic foot ulcer.  Underwent MRI of the foot showing no evidence of osteomyelitis but deep foot infection with underlying cellulitis.  Patient is going to the OR later today for debridement and incision and drainage.  REVIEW OF SYSTEMS:    Review of Systems  Constitutional: Negative for chills and fever.  HENT: Negative for congestion and tinnitus.   Eyes: Negative for blurred vision and double vision.  Respiratory: Negative for cough, shortness of breath and wheezing.   Cardiovascular: Negative for chest pain, orthopnea and PND.  Gastrointestinal: Negative for abdominal pain, diarrhea, nausea and vomiting.  Genitourinary: Negative for dysuria and hematuria.  Musculoskeletal: Positive for joint pain (Right Foot).  Neurological: Negative for dizziness, sensory change and focal weakness.  All other systems reviewed and are negative.   Nutrition: Carb control/Heart healthy Tolerating Diet: Yes Tolerating PT: Await Eval.   DRUG ALLERGIES:   Allergies  Allergen Reactions  . Banana Hives, Nausea And Vomiting and Rash  . Keflex [Cephalexin] Anaphylaxis  . Onion Hives, Nausea And Vomiting and Rash  . Sulfa Antibiotics Anaphylaxis  . Grapeseed Extract [Nutritional Supplements] Itching    VITALS:  Blood pressure 110/73, pulse 96, temperature 99.4 F (37.4 C), temperature source Oral, resp. rate 20, height 5\' 6"  (1.676 m), weight 52.2 kg (115 lb), SpO2 98 %.  PHYSICAL EXAMINATION:   Physical Exam  GENERAL:  32 y.o.-year-old patient lying in bed in no acute distress.  EYES: Pupils equal, round, reactive to light and accommodation. No scleral icterus. Extraocular muscles intact.  HEENT: Head atraumatic, normocephalic. Oropharynx and nasopharynx clear.  NECK:   Supple, no jugular venous distention. No thyroid enlargement, no tenderness.  LUNGS: Normal breath sounds bilaterally, no wheezing, rales, rhonchi. No use of accessory muscles of respiration.  CARDIOVASCULAR: S1, S2 normal. No murmurs, rubs, or gallops.  ABDOMEN: Soft, nontender, nondistended. Bowel sounds present. No organomegaly or mass.  EXTREMITIES: No cyanosis, clubbing or edema b/l. Right Foot swelling/pain   NEUROLOGIC: Cranial nerves II through XII are intact. No focal Motor or sensory deficits b/l.   PSYCHIATRIC: The patient is alert and oriented x 3.  SKIN: No obvious rash, lesion, Right diabetic foot ulcer with cellulitis noted on the dorsum of the foot.    LABORATORY PANEL:   CBC Recent Labs  Lab 12/19/17 0341  WBC 17.1*  HGB 8.2*  HCT 24.1*  PLT 220   ------------------------------------------------------------------------------------------------------------------  Chemistries  Recent Labs  Lab 12/18/17 1608 12/19/17 0341  NA 132* 134*  K 3.7 3.9  CL 99* 103  CO2 24 24  GLUCOSE 85 162*  BUN 30* 28*  CREATININE 1.21 1.30*  CALCIUM 8.2* 7.9*  AST 19  --   ALT 12*  --   ALKPHOS 67  --   BILITOT 0.5  --    ------------------------------------------------------------------------------------------------------------------  Cardiac Enzymes No results for input(s): TROPONINI in the last 168 hours. ------------------------------------------------------------------------------------------------------------------  RADIOLOGY:  Mr Foot Right Wo Contrast  Result Date: 12/19/2017 CLINICAL DATA:  Right foot pain and swelling. EXAM: MRI OF THE RIGHT FOREFOOT WITHOUT CONTRAST TECHNIQUE: Multiplanar, multisequence MR imaging of the right forefoot was performed. No intravenous contrast was administered. COMPARISON:  None. FINDINGS: Bones/Joint/Cartilage Soft tissue wound overlying the fifth MTP joint. No periosteal reaction or  bone destruction. No focal marrow signal  abnormality. No fracture or dislocation. Normal alignment. Small first MTP joint effusion. Soft tissue swelling involving the fifth digit. Ligaments Collateral ligaments are intact.  Lisfranc ligament is intact. Muscles and Tendons Flexor, peroneal and extensor compartment tendons are intact. T2 hyperintense muscle signal likely neurogenic. Soft tissue Small fluid collection along the dorsal aspect of fourth MTP joint measuring approximately 13 x 12 mm.No soft tissue mass. IMPRESSION: 1. No osteomyelitis of the right forefoot. Soft tissue wound overlying the fifth MTP joint with surrounding cellulitis. 13 x 12 mm fluid collection along the dorsal aspect of the fourth MTP joint which may reflect a small abscess. Electronically Signed   By: Kathreen Devoid   On: 12/19/2017 12:25   Dg Foot Complete Right  Result Date: 12/18/2017 CLINICAL DATA:  Injury, pain and redness of 4th and 5th toes EXAM: RIGHT FOOT COMPLETE - 3+ VIEW COMPARISON:  None. FINDINGS: Soft tissue swelling over the distal right lateral foot and 5th toe. No acute fracture, subluxation or dislocation. Joint spaces are maintained. IMPRESSION: No acute bony abnormality. Electronically Signed   By: Rolm Baptise M.D.   On: 12/18/2017 13:16     ASSESSMENT AND PLAN:   32 year old male with past medical history of diabetes, bowel syndrome, GERD who presented to the hospital due to right foot swelling pain and redness.  1.  Right diabetic foot ulcer-this the cause of patient's redness swelling and pain.   -Patient's MRI shows no evidence of osteomyelitis.  Continue IV vancomycin, will add IV aztreonam as patient is allergic to penicillin. -Seen by podiatry and plan for debridement and incision and drainage later today.  2.  Uncontrolled diabetes mellitus with hyperglycemia-continue Lantus, sliding scale insulin. - Diabetes coordinator consult given his uncontrolled blood sugars.  3.  Depression-continue Prozac.  4.  Leukocytosis-secondary to  underlying infection.  Follow with IV antibiotic therapy.   All the records are reviewed and case discussed with Care Management/Social Worker. Management plans discussed with the patient, family and they are in agreement.  CODE STATUS: Full code  DVT Prophylaxis: Lovenox  TOTAL TIME TAKING CARE OF THIS PATIENT: 30 minutes.   POSSIBLE D/C IN 1-2 DAYS, DEPENDING ON CLINICAL CONDITION.   Henreitta Leber M.D on 12/19/2017 at 3:00 PM  Between 7am to 6pm - Pager - [phone]  After 6pm go to [email or web address] - Proofreader  [facility]  Office  [phone]  CC: Primary care physician; Patient, No Pcp Per

## 2017-12-20 ENCOUNTER — Encounter: Payer: Self-pay | Admitting: *Deleted

## 2017-12-20 ENCOUNTER — Inpatient Hospital Stay: Payer: BLUE CROSS/BLUE SHIELD | Admitting: Anesthesiology

## 2017-12-20 ENCOUNTER — Encounter
Admission: EM | Disposition: A | Discharge status: Home / Self Care | Payer: Self-pay | Source: Home / Self Care | Attending: Specialist

## 2017-12-20 HISTORY — PX: IRRIGATION AND DEBRIDEMENT FOOT: SHX6602

## 2017-12-20 LAB — BASIC METABOLIC PANEL
Anion gap: 6 (ref 5–15)
BUN: 19 mg/dL (ref 6–20)
CALCIUM: 8.2 mg/dL — AB (ref 8.9–10.3)
CO2: 24 mmol/L (ref 22–32)
Chloride: 102 mmol/L (ref 101–111)
Creatinine, Ser: 1.18 mg/dL (ref 0.61–1.24)
GFR calc Af Amer: >60 mL/min (ref >60)
GFR calc non Af Amer: >60 mL/min (ref >60)
GLUCOSE: 194 mg/dL — AB (ref 65–99)
Potassium: 4.1 mmol/L (ref 3.5–5.1)
Sodium: 132 mmol/L — ABNORMAL LOW (ref 135–145)

## 2017-12-20 LAB — GLUCOSE, CAPILLARY
Glucose-Capillary: 165 mg/dL — ABNORMAL HIGH (ref 65–99)
Glucose-Capillary: 112 mg/dL — ABNORMAL HIGH (ref 65–99)
Glucose-Capillary: 115 mg/dL — ABNORMAL HIGH (ref 65–99)
Glucose-Capillary: 118 mg/dL — ABNORMAL HIGH (ref 65–99)
Glucose-Capillary: 284 mg/dL — ABNORMAL HIGH (ref 65–99)

## 2017-12-20 LAB — CBC
HCT: 23.4 % — ABNORMAL LOW (ref 40.0–52.0)
Hemoglobin: 7.9 g/dL — ABNORMAL LOW (ref 13.0–18.0)
MCH: 27.9 pg (ref 26.0–34.0)
MCHC: 33.5 g/dL (ref 32.0–36.0)
MCV: 83.3 fL (ref 80.0–100.0)
Platelets: 252 10*3/uL (ref 150–440)
RBC: 2.81 MIL/uL — ABNORMAL LOW (ref 4.40–5.90)
RDW: 13.2 % (ref 11.5–14.5)
WBC: 18.2 10*3/uL — ABNORMAL HIGH (ref 3.8–10.6)

## 2017-12-20 SURGERY — IRRIGATION AND DEBRIDEMENT FOOT
Anesthesia: General | Laterality: Right | Wound class: "Dirty or Infected "

## 2017-12-20 MED ORDER — VANCOMYCIN HCL 1000 MG IV SOLR
INTRAVENOUS | Status: AC
  Filled 2017-12-20: qty 1000

## 2017-12-20 MED ORDER — DIPHENHYDRAMINE HCL 50 MG/ML IJ SOLN
INTRAMUSCULAR | Status: AC
  Administered 2017-12-20: 12.5 mg via INTRAVENOUS
  Filled 2017-12-20: qty 1

## 2017-12-20 MED ORDER — PROPOFOL 10 MG/ML IV BOLUS
INTRAVENOUS | Status: DC | PRN
  Administered 2017-12-20: 140 mg via INTRAVENOUS

## 2017-12-20 MED ORDER — FENTANYL CITRATE (PF) 100 MCG/2ML IJ SOLN
INTRAMUSCULAR | Status: AC
  Administered 2017-12-20: 25 ug via INTRAVENOUS
  Filled 2017-12-20: qty 2

## 2017-12-20 MED ORDER — PROPOFOL 10 MG/ML IV BOLUS
INTRAVENOUS | Status: AC
  Filled 2017-12-20: qty 20

## 2017-12-20 MED ORDER — LIDOCAINE HCL (CARDIAC) PF 100 MG/5ML IV SOSY
PREFILLED_SYRINGE | INTRAVENOUS | Status: DC | PRN
  Administered 2017-12-20: 30 mg via INTRAVENOUS

## 2017-12-20 MED ORDER — MORPHINE SULFATE (PF) 4 MG/ML IV SOLN
4.0000 mg | INTRAVENOUS | Status: DC | PRN
  Administered 2017-12-21 – 2017-12-27 (×5): 4 mg via INTRAVENOUS
  Filled 2017-12-20 (×5): qty 1

## 2017-12-20 MED ORDER — LIDOCAINE HCL (PF) 1 % IJ SOLN
INTRAMUSCULAR | Status: AC
  Filled 2017-12-20: qty 30

## 2017-12-20 MED ORDER — BUPIVACAINE HCL (PF) 0.5 % IJ SOLN
INTRAMUSCULAR | Status: AC
  Filled 2017-12-20: qty 30

## 2017-12-20 MED ORDER — SODIUM CHLORIDE 0.9 % IV SOLN
INTRAVENOUS | Status: DC
  Administered 2017-12-20 (×2): via INTRAVENOUS

## 2017-12-20 MED ORDER — ONDANSETRON HCL 4 MG/2ML IJ SOLN: 4.0000 mg | Freq: Once | INTRAMUSCULAR | Status: DC | PRN

## 2017-12-20 MED ORDER — DIPHENHYDRAMINE HCL 50 MG/ML IJ SOLN
12.5000 mg | Freq: Once | INTRAMUSCULAR | Status: AC
  Administered 2017-12-20: 12.5 mg via INTRAVENOUS

## 2017-12-20 MED ORDER — FENTANYL CITRATE (PF) 100 MCG/2ML IJ SOLN
INTRAMUSCULAR | Status: AC
  Filled 2017-12-20: qty 2

## 2017-12-20 MED ORDER — FENTANYL CITRATE (PF) 100 MCG/2ML IJ SOLN
INTRAMUSCULAR | Status: DC | PRN
  Administered 2017-12-20: 25 ug via INTRAVENOUS
  Administered 2017-12-20: 50 ug via INTRAVENOUS
  Administered 2017-12-20: 25 ug via INTRAVENOUS

## 2017-12-20 MED ORDER — FENTANYL CITRATE (PF) 100 MCG/2ML IJ SOLN
25.0000 ug | INTRAMUSCULAR | Status: AC | PRN
  Administered 2017-12-20 (×6): 25 ug via INTRAVENOUS

## 2017-12-20 MED ORDER — MIDAZOLAM HCL 2 MG/2ML IJ SOLN
INTRAMUSCULAR | Status: DC | PRN
  Administered 2017-12-20: 2 mg via INTRAVENOUS

## 2017-12-20 MED ORDER — MIDAZOLAM HCL 2 MG/2ML IJ SOLN
INTRAMUSCULAR | Status: AC
  Filled 2017-12-20: qty 2

## 2017-12-20 MED ORDER — BUPIVACAINE HCL (PF) 0.5 % IJ SOLN
INTRAMUSCULAR | Status: DC | PRN
  Administered 2017-12-20: 19 mL

## 2017-12-20 MED ORDER — OXYCODONE-ACETAMINOPHEN 5-325 MG PO TABS
1.0000 | ORAL_TABLET | ORAL | Status: DC | PRN
  Administered 2017-12-21 (×2): 2 via ORAL
  Filled 2017-12-20 (×2): qty 2

## 2017-12-20 MED ORDER — ONDANSETRON HCL 4 MG/2ML IJ SOLN
INTRAMUSCULAR | Status: DC | PRN
  Administered 2017-12-20: 4 mg via INTRAVENOUS

## 2017-12-20 MED ORDER — NEOMYCIN-POLYMYXIN B GU 40-200000 IR SOLN
Status: AC
  Filled 2017-12-20: qty 4

## 2017-12-20 MED ORDER — FENTANYL CITRATE (PF) 100 MCG/2ML IJ SOLN
25.0000 ug | INTRAMUSCULAR | Status: AC | PRN
  Administered 2017-12-20 (×2): 25 ug via INTRAVENOUS

## 2017-12-20 MED ORDER — CHLORHEXIDINE GLUCONATE CLOTH 2 % EX PADS
6.0000 | MEDICATED_PAD | Freq: Every day | CUTANEOUS | Status: DC
  Administered 2017-12-20: 06:00:00 6 via TOPICAL

## 2017-12-20 SURGICAL SUPPLY — 52 items
"PENCIL ELECTRO HAND CTR " (MISCELLANEOUS) ×1 IMPLANT
BANDAGE ELASTIC 4 LF NS (GAUZE/BANDAGES/DRESSINGS) ×3 IMPLANT
BLADE OSCILLATING/SAGITTAL (BLADE)
BLADE SURG 15 STRL LF DISP TIS (BLADE) ×1 IMPLANT
BLADE SURG 15 STRL SS (BLADE) ×2
BLADE SW THK.38XMED LNG THN (BLADE) IMPLANT
BNDG ESMARK 4X12 TAN STRL LF (GAUZE/BANDAGES/DRESSINGS) ×1 IMPLANT
BNDG GAUZE 4.5X4.1 6PLY STRL (MISCELLANEOUS) ×3 IMPLANT
CANISTER SUCT 1200ML W/VALVE (MISCELLANEOUS) ×3 IMPLANT
CUFF TOURN 18 STER (MISCELLANEOUS) ×1 IMPLANT
CUFF TOURN DUAL PL 12 NO SLV (MISCELLANEOUS) ×3 IMPLANT
DRAPE FLUOR MINI C-ARM 54X84 (DRAPES) IMPLANT
DURAPREP 26ML APPLICATOR (WOUND CARE) ×3 IMPLANT
ELECT REM PT RETURN 9FT ADLT (ELECTROSURGICAL) ×3
ELECTRODE REM PT RTRN 9FT ADLT (ELECTROSURGICAL) ×1 IMPLANT
GAUZE PETRO XEROFOAM 1X8 (MISCELLANEOUS) ×3 IMPLANT
GAUZE SPONGE 4X4 12PLY STRL (GAUZE/BANDAGES/DRESSINGS) ×3 IMPLANT
GAUZE STRETCH 2X75IN STRL (MISCELLANEOUS) ×3 IMPLANT
GLOVE BIO SURGEON STRL SZ7.5 (GLOVE) ×3 IMPLANT
GLOVE INDICATOR 8.0 STRL GRN (GLOVE) ×3 IMPLANT
GOWN STRL REUS W/ TWL LRG LVL3 (GOWN DISPOSABLE) ×2 IMPLANT
GOWN STRL REUS W/TWL LRG LVL3 (GOWN DISPOSABLE) ×4
HANDPIECE VERSAJET DEBRIDEMENT (MISCELLANEOUS) ×3 IMPLANT
KIT STIMULAN RAPID CURE 5CC (Orthopedic Implant) ×2 IMPLANT
LABEL OR SOLS (LABEL) ×3 IMPLANT
NDL FILTER BLUNT 18X1 1/2 (NEEDLE) ×1 IMPLANT
NDL HYPO 25X1 1.5 SAFETY (NEEDLE) ×3 IMPLANT
NEEDLE FILTER BLUNT 18X 1/2SAF (NEEDLE) ×2
NEEDLE FILTER BLUNT 18X1 1/2 (NEEDLE) ×1 IMPLANT
NEEDLE HYPO 25X1 1.5 SAFETY (NEEDLE) ×9 IMPLANT
NS IRRIG 500ML POUR BTL (IV SOLUTION) ×3 IMPLANT
PACK EXTREMITY ARMC (MISCELLANEOUS) ×3 IMPLANT
PAD ABD DERMACEA PRESS 5X9 (GAUZE/BANDAGES/DRESSINGS) ×6 IMPLANT
PENCIL ELECTRO HAND CTR (MISCELLANEOUS) IMPLANT
PULSAVAC PLUS IRRIG FAN TIP (DISPOSABLE) ×3
RASP SM TEAR CROSS CUT (RASP) IMPLANT
SOL PREP PVP 2OZ (MISCELLANEOUS) ×3
SOLUTION PREP PVP 2OZ (MISCELLANEOUS) ×1 IMPLANT
STOCKINETTE 48X4 2 PLY STRL (GAUZE/BANDAGES/DRESSINGS) ×1 IMPLANT
STOCKINETTE STRL 4IN 9604848 (GAUZE/BANDAGES/DRESSINGS) ×3 IMPLANT
STOCKINETTE STRL 6IN 960660 (GAUZE/BANDAGES/DRESSINGS) ×3 IMPLANT
SUT ETHILON 3-0 FS-10 30 BLK (SUTURE) ×6
SUT ETHILON 4-0 (SUTURE) ×2
SUT ETHILON 4-0 FS2 18XMFL BLK (SUTURE) ×1
SUT VIC AB 3-0 SH 27 (SUTURE)
SUT VIC AB 3-0 SH 27X BRD (SUTURE) ×1 IMPLANT
SUT VIC AB 4-0 FS2 27 (SUTURE) ×1 IMPLANT
SUTURE EHLN 3-0 FS-10 30 BLK (SUTURE) IMPLANT
SUTURE ETHLN 4-0 FS2 18XMF BLK (SUTURE) ×1 IMPLANT
SYR 10ML LL (SYRINGE) ×6 IMPLANT
SYR 3ML LL SCALE MARK (SYRINGE) ×1 IMPLANT
TIP FAN IRRIG PULSAVAC PLUS (DISPOSABLE) IMPLANT

## 2017-12-20 NOTE — Op Note (Signed)
Date of operation: 12/20/2017.  Surgeon: [name] D.P.M.  Preoperative diagnosis: Cellulitis with abscess right foot.  Postoperative diagnosis: Same.  Procedure: Multiple incision I&D right foot.  Anesthesia: LMA.  Hemostasis: Pneumatic tourniquet right ankle 200 mmHg.  Cultures: Deep wound cultures right foot.  Implants: Stimulan rapid cure antibiotic beads impregnated with vancomycin.  Injectables: 19 cc of 0.5% bupivacaine plain postoperatively.  Complications: None apparent.  Operative indications: This is a 32 year old male with recent development of cellulitis with abscess on his right foot.  Admitted for IV antibiotics and surgical debridement.  Operative procedure: Patient was taken to the operating room and placed on the table in the supine position.  Following satisfactory LMA anesthesia a pneumatic tourniquet was applied at the level of the right ankle and the foot was prepped and draped in the usual sterile fashion.  The foot was elevation only exsanguinated and the tourniquet inflated to 250 mmHg.    Attention was then directed to the right foot where superficial abscess and skin slough was excisionally debrided using pickups and scissors.  An area of necrosis and full-thickness ulceration was noted on the plantar lateral aspect of the right fifth metatarsal head area as well as in the fourth interspace between the fourth and fifth toes.  An approximate 3.5 cm linear incision was made laterally over the fifth metatarsal in the in the necrotic ulcerative area.  The incision was deepened via sharp and blunt dissection.  Significant necrosis was noted at the ulcerative site and this was excisionally debrided using a pickup and scissors full-thickness down to the capsular level.  Extension was noted dorsally over the fifth metatarsal phalangeal joint area.  At this point a second incision approximately 2 cm long was made in the fourth interspace.  There was noted to be some  purulence coming from proximal and the incision was extended another 3 cm.  The necrotic area in the interspace was debrided using scissors excisionally down to the level of the joint capsule.  At this point there was noted to be some extension along the plantar surface extending into the mid arch area.  An approximate 3 cm incision was made in the central arch and this was connected with the fifth metatarsal incision area.  Using a versa jet debrider on a setting of 4 all of the incisional areas were thoroughly debrided and irrigated with the debrider on a setting of 2.  At this point the wound was then pulse irrigated using 3 L of saline with the pulse irrigation.  The plantar and lateral incisions were partially closed using 3-0 nylon simple interrupted sutures and then antibiotic beads packed into the plantar and lateral space as well as into the fourth interspace area from the third dorsal incision.  All of the wounds were then closed using 3-0 nylon simple interrupted sutures.  19 cc of 0.5% bupivacaine plain was then injected around the right midfoot.  Xeroform 4 x 4's ABDs and Kerlix were applied to the right foot and the tourniquet was released.  A second Kerlix and Ace wrap then applied to the right foot.  Patient tolerated the procedure and anesthesia well was transported to the PACU with vital signs stable in good condition.

## 2017-12-20 NOTE — Progress Notes (Signed)
[place] at [place] NAME: Shawn [name]    MR#:  [id number]  DATE OF BIRTH:  Oct 07, 1985  SUBJECTIVE:   Patient continues to have fever and right foot pain.  MRI yesterday was negative for osteomyelitis.  Patient is to go for right foot incision and drainage and debridement of his ulcer today.  REVIEW OF SYSTEMS:    Review of Systems  Constitutional: Negative for chills and fever.  HENT: Negative for congestion and tinnitus.   Eyes: Negative for blurred vision and double vision.  Respiratory: Negative for cough, shortness of breath and wheezing.   Cardiovascular: Negative for chest pain, orthopnea and PND.  Gastrointestinal: Negative for abdominal pain, diarrhea, nausea and vomiting.  Genitourinary: Negative for dysuria and hematuria.  Musculoskeletal: Positive for joint pain (Right Foot).  Neurological: Negative for dizziness, sensory change and focal weakness.  All other systems reviewed and are negative.   Nutrition: Carb control/Heart healthy Tolerating Diet: Yes Tolerating PT: Await Eval.   DRUG ALLERGIES:   Allergies  Allergen Reactions  . Banana Hives, Nausea And Vomiting and Rash  . Keflex [Cephalexin] Anaphylaxis  . Onion Hives, Nausea And Vomiting and Rash  . Sulfa Antibiotics Anaphylaxis  . Grapeseed Extract [Nutritional Supplements] Itching    VITALS:  Blood pressure 118/81, pulse 86, temperature 97.6 F (36.4 C), resp. rate 14, height 5\' 6"  (1.676 m), weight 52.2 kg (115 lb), SpO2 98 %.  PHYSICAL EXAMINATION:   Physical Exam  GENERAL:  32 y.o.-year-old patient lying in bed in no acute distress.  EYES: Pupils equal, round, reactive to light and accommodation. No scleral icterus. Extraocular muscles intact.  HEENT: Head atraumatic, normocephalic. Oropharynx and nasopharynx clear.  NECK:  Supple, no jugular venous distention. No thyroid enlargement, no tenderness.  LUNGS: Normal breath sounds bilaterally, no  wheezing, rales, rhonchi. No use of accessory muscles of respiration.  CARDIOVASCULAR: S1, S2 normal. No murmurs, rubs, or gallops.  ABDOMEN: Soft, nontender, nondistended. Bowel sounds present. No organomegaly or mass.  EXTREMITIES: No cyanosis, clubbing or edema b/l. Right Foot swelling/pain   NEUROLOGIC: Cranial nerves II through XII are intact. No focal Motor or sensory deficits b/l.   PSYCHIATRIC: The patient is alert and oriented x 3.  SKIN: No obvious rash, lesion, Right diabetic foot ulcer with cellulitis noted on the dorsum of the foot.    LABORATORY PANEL:   CBC Recent Labs  Lab 12/20/17 0338  WBC 18.2*  HGB 7.9*  HCT 23.4*  PLT 252   ------------------------------------------------------------------------------------------------------------------  Chemistries  Recent Labs  Lab 12/18/17 1608  12/20/17 0338  NA 132*   < > 132*  K 3.7   < > 4.1  CL 99*   < > 102  CO2 24   < > 24  GLUCOSE 85   < > 194*  BUN 30*   < > 19  CREATININE 1.21   < > 1.18  CALCIUM 8.2*   < > 8.2*  AST 19  --   --   ALT 12*  --   --   ALKPHOS 67  --   --   BILITOT 0.5  --   --    < > = values in this interval not displayed.   ------------------------------------------------------------------------------------------------------------------  Cardiac Enzymes No results for input(s): TROPONINI in the last 168 hours. ------------------------------------------------------------------------------------------------------------------  RADIOLOGY:  Mr Foot Right Wo Contrast  Result Date: 12/19/2017 CLINICAL DATA:  Right foot pain and swelling. EXAM: MRI OF THE RIGHT FOREFOOT  WITHOUT CONTRAST TECHNIQUE: Multiplanar, multisequence MR imaging of the right forefoot was performed. No intravenous contrast was administered. COMPARISON:  None. FINDINGS: Bones/Joint/Cartilage Soft tissue wound overlying the fifth MTP joint. No periosteal reaction or bone destruction. No focal marrow signal abnormality. No  fracture or dislocation. Normal alignment. Small first MTP joint effusion. Soft tissue swelling involving the fifth digit. Ligaments Collateral ligaments are intact.  Lisfranc ligament is intact. Muscles and Tendons Flexor, peroneal and extensor compartment tendons are intact. T2 hyperintense muscle signal likely neurogenic. Soft tissue Small fluid collection along the dorsal aspect of fourth MTP joint measuring approximately 13 x 12 mm.No soft tissue mass. IMPRESSION: 1. No osteomyelitis of the right forefoot. Soft tissue wound overlying the fifth MTP joint with surrounding cellulitis. 13 x 12 mm fluid collection along the dorsal aspect of the fourth MTP joint which may reflect a small abscess. Electronically Signed   By: Kathreen Devoid   On: 12/19/2017 12:25     ASSESSMENT AND PLAN:   32 year old male with past medical history of diabetes, bowel syndrome, GERD who presented to the hospital due to right foot swelling pain and redness.  1.  Right diabetic foot ulcer-this is the cause of patient's redness swelling and pain.   -Patient's MRI yesterday showed no evidence of osteomyelitis.   -Continue IV vancomycin, aztreonam.  Follow intraoperative, blood cultures which are currently negative.   - plan for I & D of debridement of Right foot ulcer today as per Podiatry.   2.  Uncontrolled diabetes mellitus with hyperglycemia-continue Lantus, sliding scale insulin and follow BS.  - appreciate Diabetes Coordinator input.   3.  Depression-continue Prozac.  4.  Leukocytosis-secondary to underlying infection.  Follow with IV antibiotic therapy.   All the records are reviewed and case discussed with Care Management/Social Worker. Management plans discussed with the patient, family and they are in agreement.  CODE STATUS: Full code  DVT Prophylaxis: Lovenox  TOTAL TIME TAKING CARE OF THIS PATIENT: 25 minutes.   POSSIBLE D/C IN 1-2 DAYS, DEPENDING ON CLINICAL CONDITION.   Henreitta Leber M.D on  12/20/2017 at 3:26 PM  Between 7am to 6pm - Pager - [phone]  After 6pm go to [email or web address] - Proofreader  [facility]  Office  [phone]  CC: Primary care physician; Patient, No Pcp Per

## 2017-12-20 NOTE — Plan of Care (Signed)
  Problem: Education: Goal: Knowledge of General Education information will improve Outcome: Progressing   Problem: Health Behavior/Discharge Planning: Goal: Ability to manage health-related needs will improve Outcome: Progressing   Problem: Clinical Measurements: Goal: Ability to maintain clinical measurements within normal limits will improve Outcome: Progressing Goal: Diagnostic test results will improve Outcome: Progressing Goal: Respiratory complications will improve Outcome: Progressing Goal: Cardiovascular complication will be avoided Outcome: Progressing   Problem: Activity: Goal: Risk for activity intolerance will decrease Outcome: Progressing   Problem: Nutrition: Goal: Adequate nutrition will be maintained Outcome: Progressing   Problem: Coping: Goal: Level of anxiety will decrease Outcome: Progressing   Problem: Elimination: Goal: Will not experience complications related to bowel motility Outcome: Progressing Goal: Will not experience complications related to urinary retention Outcome: Progressing   Problem: Pain Managment: Goal: General experience of comfort will improve Outcome: Progressing   Problem: Safety: Goal: Ability to remain free from injury will improve Outcome: Progressing   Problem: Skin Integrity: Goal: Risk for impaired skin integrity will decrease Outcome: Progressing   Problem: Nutrition Goal: Nutritional status is improving Description Monitor and assess patient for malnutrition (ex- brittle hair, bruises, dry skin, pale skin and conjunctiva, muscle wasting, smooth red tongue, and disorientation). Collaborate with interdisciplinary team and initiate plan and interventions as ordered.  Monitor patient's weight and dietary intake as ordered or per policy. Utilize nutrition screening tool and intervene per policy. Determine patient's food preferences and provide high-protein, high-caloric foods as appropriate.  Outcome: Progressing    Problem: Clinical Measurements: Goal: Signs and symptoms of infection will decrease Outcome: Progressing

## 2017-12-20 NOTE — Anesthesia Preprocedure Evaluation (Addendum)
Anesthesia Evaluation  Patient identified by MRN, date of birth, ID band Patient awake    Reviewed: Allergy & Precautions, NPO status , Patient's Chart, lab work & pertinent test results, reviewed documented beta blocker date and time   Airway Mallampati: II  TM Distance: >3 FB     Dental  (+) Chipped, Dental Advisory Given, Poor Dentition, Missing   Pulmonary           Cardiovascular      Neuro/Psych PSYCHIATRIC DISORDERS Depression    GI/Hepatic GERD  Controlled,  Endo/Other  diabetes, Type 2  Renal/GU      Musculoskeletal   Abdominal   Peds  Hematology   Anesthesia Other Findings Hb 7.9. HR 114.  Reproductive/Obstetrics                            Anesthesia Physical Anesthesia Plan  ASA: III  Anesthesia Plan: General   Post-op Pain Management:    Induction: Intravenous  PONV Risk Score and Plan:   Airway Management Planned: LMA  Additional Equipment:   Intra-op Plan:   Post-operative Plan:   Informed Consent: I have reviewed the patients History and Physical, chart, labs and discussed the procedure including the risks, benefits and alternatives for the proposed anesthesia with the patient or authorized representative who has indicated his/her understanding and acceptance.     Plan Discussed with: CRNA  Anesthesia Plan Comments:         Anesthesia Quick Evaluation

## 2017-12-20 NOTE — Anesthesia Post-op Follow-up Note (Signed)
Anesthesia QCDR form completed.        

## 2017-12-20 NOTE — Transfer of Care (Signed)
Immediate Anesthesia Transfer of Care Note  Patient: Shawn [name]  Procedure(s) Performed: IRRIGATION AND DEBRIDEMENT FOOT (Right )  Patient Location: PACU  Anesthesia Type:General  Level of Consciousness: awake, alert  and oriented  Airway & Oxygen Therapy: Patient connected to face mask oxygen  Post-op Assessment: Report given to RN and Post -op Vital signs reviewed and stable  Post vital signs: Reviewed and stable  Last Vitals:  Vitals Value Taken Time  BP 119/88 12/20/2017  2:20 PM  Temp 36.3 C 12/20/2017  2:20 PM  [name] 84 12/20/2017  2:22 PM  Resp 15 12/20/2017  2:22 PM  SpO2 100 % 12/20/2017  2:22 PM  Vitals shown include unvalidated device data.  Last Pain:  Vitals:   12/20/17 1420  TempSrc:   PainSc: 0-No pain      Patients Stated Pain Goal: 0 (89/02/28 4069)  Complications: No apparent anesthesia complications

## 2017-12-20 NOTE — Anesthesia Postprocedure Evaluation (Signed)
Anesthesia Post Note  Patient: Shawn [name]  Procedure(s) Performed: IRRIGATION AND DEBRIDEMENT FOOT (Right )  Patient location during evaluation: PACU Anesthesia Type: General Level of consciousness: awake and alert and oriented Pain management: pain level controlled Vital Signs Assessment: post-procedure vital signs reviewed and stable Respiratory status: spontaneous breathing Cardiovascular status: blood pressure returned to baseline and stable Anesthetic complications: no     Last Vitals:  Vitals:   12/20/17 1226 12/20/17 1420  BP: 119/82 119/88  Pulse: (!) 101 85  Resp: 20 13  Temp: 37.7 C (!) 36.3 C  SpO2: 99% 100%    Last Pain:  Vitals:   12/20/17 1420  TempSrc:   PainSc: 0-No pain                 Gentry Fitz

## 2017-12-20 NOTE — Interval H&P Note (Signed)
History and Physical Interval Note:  12/20/2017 12:26 PM  Shawn [name]  has presented today for surgery, with the diagnosis of n/a  The various methods of treatment have been discussed with the patient and family. After consideration of risks, benefits and other options for treatment, the patient has consented to  Procedure(s): IRRIGATION AND DEBRIDEMENT FOOT (Right) as a surgical intervention .  The patient's history has been reviewed, patient examined, no change in status, stable for surgery.  I have reviewed the patient's chart and labs.  Questions were answered to the patient's satisfaction.     [name]

## 2017-12-21 ENCOUNTER — Encounter: Payer: Self-pay | Admitting: Podiatry

## 2017-12-21 LAB — CBC WITH DIFFERENTIAL/PLATELET
BASOS PCT: 0 %
Basophils Absolute: 0.1 10*3/uL (ref 0–0.1)
Eosinophils Absolute: 0.2 10*3/uL (ref 0–0.7)
Eosinophils Relative: 2 %
HCT: 23.3 % — ABNORMAL LOW (ref 40.0–52.0)
HEMOGLOBIN: 7.8 g/dL — AB (ref 13.0–18.0)
Lymphocytes Relative: 9 %
Lymphs Abs: 1.2 10*3/uL (ref 1.0–3.6)
MCH: 27.7 pg (ref 26.0–34.0)
MCHC: 33.3 g/dL (ref 32.0–36.0)
MCV: 83.3 fL (ref 80.0–100.0)
Monocytes Absolute: 1.8 10*3/uL — ABNORMAL HIGH (ref 0.2–1.0)
Monocytes Relative: 13 %
NEUTROS ABS: 10.9 10*3/uL — AB (ref 1.4–6.5)
NEUTROS PCT: 76 %
Platelets: 304 10*3/uL (ref 150–440)
RBC: 2.8 MIL/uL — AB (ref 4.40–5.90)
RDW: 13.5 % (ref 11.5–14.5)
WBC: 14.2 10*3/uL — ABNORMAL HIGH (ref 3.8–10.6)

## 2017-12-21 LAB — GLUCOSE, CAPILLARY
GLUCOSE-CAPILLARY: 283 mg/dL — AB (ref 65–99)
GLUCOSE-CAPILLARY: 250 mg/dL — AB (ref 65–99)
Glucose-Capillary: 337 mg/dL — ABNORMAL HIGH (ref 65–99)
Glucose-Capillary: 293 mg/dL — ABNORMAL HIGH (ref 65–99)

## 2017-12-21 LAB — VANCOMYCIN, TROUGH: Vancomycin Tr: 13 ug/mL — ABNORMAL LOW (ref 15–20)

## 2017-12-21 MED ORDER — VANCOMYCIN HCL IN DEXTROSE 750-5 MG/150ML-% IV SOLN
750.0000 mg | Freq: Two times a day (BID) | INTRAVENOUS | Status: DC
  Administered 2017-12-21 – 2017-12-26 (×10): 750 mg via INTRAVENOUS
  Filled 2017-12-21 (×11): qty 150

## 2017-12-21 MED ORDER — INSULIN ASPART 100 UNIT/ML ~~LOC~~ SOLN
4.0000 [IU] | Freq: Three times a day (TID) | SUBCUTANEOUS | Status: DC
  Administered 2017-12-21 – 2017-12-22 (×3): 4 [IU] via SUBCUTANEOUS
  Filled 2017-12-21 (×3): qty 1

## 2017-12-21 MED ORDER — OXYCODONE-ACETAMINOPHEN 5-325 MG PO TABS
1.0000 | ORAL_TABLET | ORAL | Status: DC | PRN
  Administered 2017-12-21: 16:00:00 1 via ORAL
  Administered 2017-12-22 – 2017-12-27 (×21): 2 via ORAL
  Filled 2017-12-21 (×22): qty 2

## 2017-12-21 MED ORDER — ALPRAZOLAM 0.25 MG PO TABS
0.2500 mg | ORAL_TABLET | Freq: Three times a day (TID) | ORAL | Status: DC | PRN
  Administered 2017-12-21 – 2017-12-27 (×14): 0.25 mg via ORAL
  Filled 2017-12-21 (×14): qty 1

## 2017-12-21 MED ORDER — VANCOMYCIN HCL 10 G IV SOLR
1250.0000 mg | INTRAVENOUS | Status: DC
  Filled 2017-12-21: qty 1250

## 2017-12-21 NOTE — Progress Notes (Signed)
Results for NICOLA, QUESNELL (MRN [id number]) as of 12/21/2017 14:26  Ref. Range 12/21/2017 07:45 12/21/2017 12:04  Glucose-Capillary Latest Ref Range: 65 - 99 mg/dL 283 (H) 337 (H)     MD- Please consider starting Novolog Meal Coverage for patient in hospital:  Recommend Novolog 4 units TID with meals (hold if pt eats <50% of meal)      --Will follow patient during hospitalization--  Wyn Quaker RN, MSN, CDE Diabetes Coordinator Inpatient Glycemic Control Team Team Pager: [phone] (8a-5p)

## 2017-12-21 NOTE — Progress Notes (Signed)
1 Day Post-Op   Subjective/Chief Complaint: Patient seen.  Still significant pain in the right foot but states it is not a constant pain.   Objective: Vital signs in last 24 hours: Temp:  [97.3 F (36.3 C)-100.2 F (37.9 C)] 100.2 F (37.9 C) (06/19 0502) Pulse Rate:  [83-109] 109 (06/19 0502) Resp:  [12-20] 14 (06/19 0502) BP: (115-133)/(81-96) 133/86 (06/19 0502) SpO2:  [96 %-100 %] 99 % (06/19 0502) Last BM Date: 12/21/17  Intake/Output from previous day: 06/18 0701 - 06/19 0700 In: 1935.8 [I.V.:1535.8; IV Piggyback:400] Out: 910 [Urine:900; Blood:10] Intake/Output this shift: Total I/O In: 240 [P.O.:240] Out: -   Moderate to heavy bleeding is noted on the bandaging.  No significant purulence noted on the bandaging.  Some improvement in the erythema and edema in the right foot.  Significant dusky appearance to the distal aspect of the right fifth toe.  Lab Results:  Recent Labs    12/20/17 0338 12/21/17 0550  WBC 18.2* 14.2*  HGB 7.9* 7.8*  HCT 23.4* 23.3*  PLT 252 304   BMET Recent Labs    12/19/17 0341 12/20/17 0338  NA 134* 132*  K 3.9 4.1  CL 103 102  CO2 24 24  GLUCOSE 162* 194*  BUN 28* 19  CREATININE 1.30* 1.18  CALCIUM 7.9* 8.2*   PT/INR Recent Labs    12/18/17 1608  LABPROT 13.1  INR 1.00   ABG No results for input(s): PHART, HCO3 in the last 72 hours.  Invalid input(s): PCO2, PO2  Studies/Results: No results found.  Anti-infectives: Anti-infectives (From admission, onward)   Start     Dose/Rate Route Frequency Ordered Stop   12/22/17 0000  vancomycin (VANCOCIN) 1,250 mg in sodium chloride 0.9 % 250 mL IVPB  Status:  Discontinued     1,250 mg 166.7 mL/hr over 90 Minutes Intravenous Every 18 hours 12/21/17 0658 12/21/17 1005   12/21/17 1800  vancomycin (VANCOCIN) IVPB 750 mg/150 ml premix     750 mg 150 mL/hr over 60 Minutes Intravenous Every 12 hours 12/21/17 1005     12/19/17 1800  vancomycin (VANCOCIN) IVPB 1000 mg/200 mL  premix  Status:  Discontinued     1,000 mg 200 mL/hr over 60 Minutes Intravenous Every 18 hours 12/19/17 0948 12/21/17 0658   12/19/17 1045  aztreonam (AZACTAM) 2 g in sodium chloride 0.9 % 100 mL IVPB     2 g 200 mL/hr over 30 Minutes Intravenous Every 8 hours 12/19/17 1033     12/19/17 0000  vancomycin (VANCOCIN) IVPB 1000 mg/200 mL premix  Status:  Discontinued     1,000 mg 200 mL/hr over 60 Minutes Intravenous Every 24 hours 12/18/17 1403 12/19/17 0948   12/18/17 1445  vancomycin (VANCOCIN) IVPB 1000 mg/200 mL premix  Status:  Discontinued     1,000 mg 200 mL/hr over 60 Minutes Intravenous  Once 12/18/17 1442 12/18/17 1444   12/18/17 1300  vancomycin (VANCOCIN) IVPB 1000 mg/200 mL premix     1,000 mg 200 mL/hr over 60 Minutes Intravenous  Once 12/18/17 1248 12/18/17 1445      Assessment/Plan: s/p Procedure(s): IRRIGATION AND DEBRIDEMENT FOOT (Right) Assessment: Status post multiple incision I&D right foot, probable early gangrenous changes to the fifth toe.   Plan: Dry sterile dressing reapplied.  We will allow the fifth toe to demarcate and follow this closely.  Patient may need follow-up debridement which may also include amputation of the fifth toe.  We will reevaluate tomorrow.  LOS: 3 days  [name] 12/21/2017

## 2017-12-21 NOTE — Progress Notes (Signed)
Shawn [name] at [place] NAME: Shawn [name]    MR#:  [id number]  DATE OF BIRTH:  20-Dec-1985  SUBJECTIVE:   Patient is status post incision and drainage of a diabetic foot ulcer postop day #1 today.  Still having significant pain in the right foot.  No fever.  Intraoperative cultures are pending.  REVIEW OF SYSTEMS:    Review of Systems  Constitutional: Negative for chills and fever.  HENT: Negative for congestion and tinnitus.   Eyes: Negative for blurred vision and double vision.  Respiratory: Negative for cough, shortness of breath and wheezing.   Cardiovascular: Negative for chest pain, orthopnea and PND.  Gastrointestinal: Negative for abdominal pain, diarrhea, nausea and vomiting.  Genitourinary: Negative for dysuria and hematuria.  Musculoskeletal: Positive for joint pain (Right Foot).  Neurological: Negative for dizziness, sensory change and focal weakness.  All other systems reviewed and are negative.   Nutrition: Carb control/Heart healthy Tolerating Diet: Yes Tolerating PT: Await Eval.   DRUG ALLERGIES:   Allergies  Allergen Reactions  . Banana Hives, Nausea And Vomiting and Rash  . Keflex [Cephalexin] Anaphylaxis  . Onion Hives, Nausea And Vomiting and Rash  . Sulfa Antibiotics Anaphylaxis  . Grapeseed Extract [Nutritional Supplements] Itching    VITALS:  Blood pressure 130/84, pulse 99, temperature 98.3 F (36.8 C), temperature source Oral, resp. rate 14, height 5\' 6"  (1.676 m), weight 52.2 kg (115 lb), SpO2 98 %.  PHYSICAL EXAMINATION:   Physical Exam  GENERAL:  32 y.o.-year-old patient lying in bed in no acute distress.  EYES: Pupils equal, round, reactive to light and accommodation. No scleral icterus. Extraocular muscles intact.  HEENT: Head atraumatic, normocephalic. Oropharynx and nasopharynx clear.  NECK:  Supple, no jugular venous distention. No thyroid enlargement, no tenderness.  LUNGS: Normal breath  sounds bilaterally, no wheezing, rales, rhonchi. No use of accessory muscles of respiration.  CARDIOVASCULAR: S1, S2 normal. No murmurs, rubs, or gallops.  ABDOMEN: Soft, nontender, nondistended. Bowel sounds present. No organomegaly or mass.  EXTREMITIES: No cyanosis, clubbing or edema b/l. Right foot dressing in place from surgery yesterday. NEUROLOGIC: Cranial nerves II through XII are intact. No focal Motor or sensory deficits b/l.   PSYCHIATRIC: The patient is alert and oriented x 3.  SKIN: No obvious rash, lesion  LABORATORY PANEL:   CBC Recent Labs  Lab 12/21/17 0550  WBC 14.2*  HGB 7.8*  HCT 23.3*  PLT 304   ------------------------------------------------------------------------------------------------------------------  Chemistries  Recent Labs  Lab 12/18/17 1608  12/20/17 0338  NA 132*   < > 132*  K 3.7   < > 4.1  CL 99*   < > 102  CO2 24   < > 24  GLUCOSE 85   < > 194*  BUN 30*   < > 19  CREATININE 1.21   < > 1.18  CALCIUM 8.2*   < > 8.2*  AST 19  --   --   ALT 12*  --   --   ALKPHOS 67  --   --   BILITOT 0.5  --   --    < > = values in this interval not displayed.   ------------------------------------------------------------------------------------------------------------------  Cardiac Enzymes No results for input(s): TROPONINI in the last 168 hours. ------------------------------------------------------------------------------------------------------------------  RADIOLOGY:  No results found.   ASSESSMENT AND PLAN:   32 year old male with past medical history of diabetes, bowel syndrome, GERD who presented to the hospital due to right foot swelling pain  and redness.  1.  Right diabetic foot ulcer-this was the cause of patient's redness swelling and pain.   -Patient's MRI showed no evidence of osteomyelitis.   - s/p I & D and debridement by Podiatry yesterday POD # 1.  -Continue IV vancomycin, aztreonam.  Follow intraoperative blood cultures  which are currently Staph and Gram (-) rods but no identified.     2.  Uncontrolled diabetes mellitus with hyperglycemia-continue Lantus, sliding scale insulin and follow BS.  - appreciate Diabetes Coordinator input and will add some Novolog with meals.   3.  Depression/Anxiety-continue Prozac and added some Xanax for anxiety.   4.  Leukocytosis-secondary to underlying infection.  Follow with IV antibiotic therapy and it's improving.   All the records are reviewed and case discussed with Care Management/Social Worker. Management plans discussed with the patient, family and they are in agreement.  CODE STATUS: Full code  DVT Prophylaxis: Lovenox  TOTAL TIME TAKING CARE OF THIS PATIENT: 25 minutes.   POSSIBLE D/C IN 2-3 DAYS, DEPENDING ON CLINICAL CONDITION.   Henreitta Leber M.D on 12/21/2017 at 3:32 PM  Between 7am to 6pm - Pager - [phone]  After 6pm go to [email or web address] - Proofreader  [facility]  Office  [phone]  CC: Primary care physician; Patient, No Pcp Per

## 2017-12-21 NOTE — Progress Notes (Addendum)
Inpatient Diabetes Program Recommendations  AACE/ADA: New Consensus Statement on Inpatient Glycemic Control (2015)  Target Ranges:  Prepandial:   less than 140 mg/dL      Peak postprandial:   less than 180 mg/dL (1-2 hours)      Critically ill patients:  140 - 180 mg/dL   Results for NATASHA, PAULSON (MRN [id number]) as of 12/21/2017 09:44  Ref. Range 12/20/2017 07:48 12/20/2017 12:03 12/20/2017 14:25 12/20/2017 16:39 12/20/2017 21:21  Glucose-Capillary Latest Ref Range: 65 - 99 mg/dL 165 (H)  2 units NOVOLOG  112 (H) 115 (H) 118 (H) 284 (H)  3 units NOVOLOG    Results for CAPTAIN, BLUCHER (MRN [id number]) as of 12/21/2017 09:44  Ref. Range 12/21/2017 07:45  Glucose-Capillary Latest Ref Range: 65 - 99 mg/dL 283 (H)  5 units NOVOLOG     Home DM Meds: Lantus 30 units daily  Current Orders: Lantus 22 units daily                             Novolog Sensitive Correction Scale/ SSI (0-9 units) TID AC + HS     Lantus was HELD yesterday afternoon.  CBG elevated to 283 mg/dl this AM as a result.  Patient should get Lantus this AM (due for 10am).    MD- Patient needs Novolog Rx at time of discharge.  Only taking Lantus at home and needs Novolog for both correction and meal coverage at home.  Please give patient a Rx for Novolog at time of discharge and consider the following SSI and Novolog Meal Coverage for home as well:  Likely needs SSI + Novolog Meal Coverage  Recommend Novolog SSI before meals: 150- 200 mg/dl- 1 unit 201-250 mg/dl- 2 units 251-300 mg/dl- 3 units 301-350 mg/dl- 4 units 351-400 mg/dl- 5 units >400 mg/dl- 6 units  +  Novolog 3 units TID with meals (DO NOT take if you do not eat a meal)     --Will follow patient during hospitalization--  Wyn Quaker RN, MSN, CDE Diabetes Coordinator Inpatient Glycemic Control Team Team Pager: [phone] (8a-5p)

## 2017-12-21 NOTE — Progress Notes (Addendum)
Pharmacy Antibiotic Note  Shawn [name] is a 32 y.o. male admitted on 12/18/2017 with cellulitis.  Pharmacy has been consulted for vancomycin dosing. He was recently discharged from [facility] after being treated for DKA. His Scr is modestly elevated on this admission with a baseline near 1   Plan: Vancomycin 1000mg  IV every 18 hours following 1000mg  in the ED. Stacked dose calculated at 10h  Goal trough 15-20 mcg/mL. Vt will be scheduled prior to the 5th dose for now  Ke: 0.055, Vd: 36.5 L, T1/2: 12.5 h, calculated steady-state concentration: 32mcg/mL  06/19 @ 0600 VT 13 subtherapeutic. Recalculated Ke 0.063 hr-1, T1/2 11 hr. Will increase dose to vanc 750 mg IV Q12H and will recheck VT @ 06/21 @ 1730. Est Css 18 mcg/mL  Height: 5\' 6"  (167.6 cm) Weight: 115 lb (52.2 kg) IBW/kg (Calculated) : 63.8  Temp (24hrs), Avg:99.2 F (37.3 C), Min:97.3 F (36.3 C), Max:102.4 F (39.1 C)  Recent Labs  Lab 12/18/17 1221 12/18/17 1255 12/18/17 1547 12/18/17 1608 12/18/17 2026 12/19/17 0341 12/20/17 0338 12/21/17 0550  WBC 23.0*  --   --  19.6*  --  17.1* 18.2* 14.2*  CREATININE 1.39*  --   --  1.21  --  1.30* 1.18  --   LATICACIDVEN  --  1.0 1.0  --  0.6  --   --   --   VANCOTROUGH  --   --   --   --   --   --   --  13*    Estimated Creatinine Clearance: 67.6 mL/min (by C-G formula based on SCr of 1.18 mg/dL).    Allergies  Allergen Reactions  . Banana Hives, Nausea And Vomiting and Rash  . Keflex [Cephalexin] Anaphylaxis  . Onion Hives, Nausea And Vomiting and Rash  . Sulfa Antibiotics Anaphylaxis  . Grapeseed Extract [Nutritional Supplements] Itching    Antimicrobials this admission: Vancomycin 6/16 >>   Microbiology results: 6/16 BCx: pending  Thank you for allowing pharmacy to be a part of this patient's care.  Tobie Lords, PharmD 12/21/2017 6:58 AM

## 2017-12-22 LAB — GLUCOSE, CAPILLARY
Glucose-Capillary: 188 mg/dL — ABNORMAL HIGH (ref 65–99)
Glucose-Capillary: 207 mg/dL — ABNORMAL HIGH (ref 65–99)
Glucose-Capillary: 219 mg/dL — ABNORMAL HIGH (ref 65–99)
Glucose-Capillary: 234 mg/dL — ABNORMAL HIGH (ref 65–99)

## 2017-12-22 LAB — RETICULOCYTES
RBC.: 2.7 MIL/uL — ABNORMAL LOW (ref 4.40–5.90)
Retic Count, Absolute: 29.7 10*3/uL (ref 19.0–183.0)
Retic Ct Pct: 1.1 % (ref 0.4–3.1)

## 2017-12-22 LAB — FERRITIN: Ferritin: 118 ng/mL (ref 24–336)

## 2017-12-22 LAB — CBC WITH DIFFERENTIAL/PLATELET
Basophils Absolute: 0.1 10*3/uL (ref 0–0.1)
Basophils Relative: 1 %
Eosinophils Absolute: 0.2 10*3/uL (ref 0–0.7)
Eosinophils Relative: 2 %
HCT: 22.5 % — ABNORMAL LOW (ref 40.0–52.0)
Hemoglobin: 7.6 g/dL — ABNORMAL LOW (ref 13.0–18.0)
LYMPHS ABS: 1.4 10*3/uL (ref 1.0–3.6)
Lymphocytes Relative: 13 %
MCH: 28 pg (ref 26.0–34.0)
MCHC: 33.7 g/dL (ref 32.0–36.0)
MCV: 83.1 fL (ref 80.0–100.0)
MONOS PCT: 14 %
Monocytes Absolute: 1.5 10*3/uL — ABNORMAL HIGH (ref 0.2–1.0)
Neutro Abs: 7.6 10*3/uL — ABNORMAL HIGH (ref 1.4–6.5)
Neutrophils Relative %: 70 %
PLATELETS: 347 10*3/uL (ref 150–440)
RBC: 2.71 MIL/uL — ABNORMAL LOW (ref 4.40–5.90)
RDW: 13 % (ref 11.5–14.5)
WBC: 10.8 10*3/uL — ABNORMAL HIGH (ref 3.8–10.6)

## 2017-12-22 LAB — IRON AND TIBC
Iron: 18 ug/dL — ABNORMAL LOW (ref 45–182)
SATURATION RATIOS: 12 % — AB (ref 17.9–39.5)
TIBC: 147 ug/dL — AB (ref 250–450)
UIBC: 129 ug/dL

## 2017-12-22 LAB — VITAMIN B12: VITAMIN B 12: 456 pg/mL (ref 180–914)

## 2017-12-22 LAB — FOLATE: Folate: 9.6 ng/mL (ref >5.9)

## 2017-12-22 MED ORDER — DOCUSATE SODIUM 100 MG PO CAPS
100.0000 mg | ORAL_CAPSULE | Freq: Two times a day (BID) | ORAL | Status: DC
  Administered 2017-12-24: 13:00:00 100 mg via ORAL
  Filled 2017-12-22 (×3): qty 1

## 2017-12-22 MED ORDER — INSULIN ASPART 100 UNIT/ML ~~LOC~~ SOLN
6.0000 [IU] | Freq: Three times a day (TID) | SUBCUTANEOUS | Status: DC
  Administered 2017-12-22 – 2017-12-26 (×7): 6 [IU] via SUBCUTANEOUS
  Filled 2017-12-22 (×8): qty 1

## 2017-12-22 MED ORDER — INSULIN GLARGINE 100 UNIT/ML ~~LOC~~ SOLN
24.0000 [IU] | Freq: Every day | SUBCUTANEOUS | Status: DC
  Administered 2017-12-23 – 2017-12-24 (×2): 24 [IU] via SUBCUTANEOUS
  Filled 2017-12-22 (×3): qty 0.24

## 2017-12-22 NOTE — Progress Notes (Signed)
Inpatient Diabetes Program Recommendations  AACE/ADA: New Consensus Statement on Inpatient Glycemic Control (2015)  Target Ranges:  Prepandial:   less than 140 mg/dL      Peak postprandial:   less than 180 mg/dL (1-2 hours)      Critically ill patients:  140 - 180 mg/dL   Results for Shawn [name], Shawn [name] (MRN [id number]) as of 12/22/2017 14:20  Ref. Range 12/21/2017 07:45 12/21/2017 12:04 12/21/2017 17:07 12/21/2017 21:04  Glucose-Capillary Latest Ref Range: 65 - 99 mg/dL 283 (H)  5 units NOVOLOG  337 (H)  7 units NOVOLOG +  22 units LANTUS 250 (H)  7 units NOVOLOG  293 (H)  3 units NOVOLOG    Results for Shawn [name], Shawn [name] (MRN [id number]) as of 12/22/2017 14:20  Ref. Range 12/22/2017 07:45 12/22/2017 12:44  Glucose-Capillary Latest Ref Range: 65 - 99 mg/dL 188 (H)  6 units NOVOLOG +  22 units LANTUS  207 (H)  7 units NOVOLOG      HomeDM Meds: Lantus 30units daily  CurrentOrders: Lantus 22units daily Novolog Sensitive Correction Scale/ SSI (0-9 units) TID AC + HS      Novolog 4 units TID with meals     Note Novolog 4 units Meal Coverage started yesterday at 5pm.    CBGs still elevated.    MD- Please consider the following in-hospital insulin adjustments:  1. Increase Lantus slightly to 24 units daily  2. Increase Novolog Meal Coverage to: Novolog 6 units TID with meals (hold if pt eats <50% of meal)     --Will follow patient during hospitalization--  Wyn Quaker RN, MSN, CDE Diabetes Coordinator Inpatient Glycemic Control Team Team Pager: [phone] (8a-5p)

## 2017-12-22 NOTE — Progress Notes (Signed)
[place] at [place] NAME: Shawn [name]    MR#:  [id number]  DATE OF BIRTH:  Sep 18, 1985  SUBJECTIVE:   Patient is status post incision and drainage of a diabetic foot ulcer postop day #2 today.  Still having significant pain in the right foot.  Podiatry likely to take him back to OR later this weekend for further debridement of the wound.   REVIEW OF SYSTEMS:    Review of Systems  Constitutional: Negative for chills and fever.  HENT: Negative for congestion and tinnitus.   Eyes: Negative for blurred vision and double vision.  Respiratory: Negative for cough, shortness of breath and wheezing.   Cardiovascular: Negative for chest pain, orthopnea and PND.  Gastrointestinal: Negative for abdominal pain, diarrhea, nausea and vomiting.  Genitourinary: Negative for dysuria and hematuria.  Musculoskeletal: Positive for joint pain (Right Foot).  Neurological: Negative for dizziness, sensory change and focal weakness.  All other systems reviewed and are negative.   Nutrition: Carb control/Heart healthy Tolerating Diet: Yes Tolerating PT: Await Eval.   DRUG ALLERGIES:   Allergies  Allergen Reactions  . Banana Hives, Nausea And Vomiting and Rash  . Keflex [Cephalexin] Anaphylaxis  . Onion Hives, Nausea And Vomiting and Rash  . Sulfa Antibiotics Anaphylaxis  . Grapeseed Extract [Nutritional Supplements] Itching    VITALS:  Blood pressure 114/78, pulse 99, temperature 98.2 F (36.8 C), temperature source Oral, resp. rate 15, height 5\' 6"  (1.676 m), weight 52.2 kg (115 lb), SpO2 98 %.  PHYSICAL EXAMINATION:   Physical Exam  GENERAL:  32 y.o.-year-old patient lying in bed in no acute distress.  EYES: Pupils equal, round, reactive to light and accommodation. No scleral icterus. Extraocular muscles intact.  HEENT: Head atraumatic, normocephalic. Oropharynx and nasopharynx clear.  NECK:  Supple, no jugular venous distention. No thyroid  enlargement, no tenderness.  LUNGS: Normal breath sounds bilaterally, no wheezing, rales, rhonchi. No use of accessory muscles of respiration.  CARDIOVASCULAR: S1, S2 normal. No murmurs, rubs, or gallops.  ABDOMEN: Soft, nontender, nondistended. Bowel sounds present. No organomegaly or mass.  EXTREMITIES: No cyanosis, clubbing or edema b/l. Right foot dressing in place from surgery yesterday. NEUROLOGIC: Cranial nerves II through XII are intact. No focal Motor or sensory deficits b/l.   PSYCHIATRIC: The patient is alert and oriented x 3.  SKIN: No obvious rash, lesion  LABORATORY PANEL:   CBC Recent Labs  Lab 12/22/17 0400  WBC 10.8*  HGB 7.6*  HCT 22.5*  PLT 347   ------------------------------------------------------------------------------------------------------------------  Chemistries  Recent Labs  Lab 12/18/17 1608  12/20/17 0338  NA 132*   < > 132*  K 3.7   < > 4.1  CL 99*   < > 102  CO2 24   < > 24  GLUCOSE 85   < > 194*  BUN 30*   < > 19  CREATININE 1.21   < > 1.18  CALCIUM 8.2*   < > 8.2*  AST 19  --   --   ALT 12*  --   --   ALKPHOS 67  --   --   BILITOT 0.5  --   --    < > = values in this interval not displayed.   ------------------------------------------------------------------------------------------------------------------  Cardiac Enzymes No results for input(s): TROPONINI in the last 168 hours. ------------------------------------------------------------------------------------------------------------------  RADIOLOGY:  No results found.   ASSESSMENT AND PLAN:   32 year old male with past medical history of diabetes, bowel syndrome, GERD  who presented to the hospital due to right foot swelling pain and redness.  1.  Right diabetic foot ulcer-this was the cause of patient's redness swelling and pain.   -Patient's MRI showed no evidence of osteomyelitis.   - s/p I & D and debridement by Podiatry POD # 2 today.  Podiatry plans for doing  further debridement but is awaiting further demarcation of the wound likely to be done this weekend. -Continue IV vancomycin, aztreonam.  Follow intraoperative blood cultures which are currently growing Staph and Gram (-) rods but no identified yet.    2.  Uncontrolled diabetes mellitus with hyperglycemia-continue Lantus but will advance dose, cont. sliding scale insulin and follow BS.  - appreciate Diabetes Coordinator input   3.  Depression/Anxiety-continue Prozac and added some Xanax for anxiety.   4.  Leukocytosis-secondary to underlying infection.  Improving w/ IV abx.   5. Anemia - Normocytic anemia. ?? Hemodilutional.  - will check iron, ferritin, folate, B12.   All the records are reviewed and case discussed with Care Management/Social Worker. Management plans discussed with the patient, family and they are in agreement.  CODE STATUS: Full code  DVT Prophylaxis: Lovenox  TOTAL TIME TAKING CARE OF THIS PATIENT: 30 minutes.   POSSIBLE D/C IN 2-3 DAYS, DEPENDING ON CLINICAL CONDITION.   Henreitta Leber M.D on 12/22/2017 at 3:15 PM  Between 7am to 6pm - Pager - [phone]  After 6pm go to [email or web address] - Proofreader  [facility]  Office  [phone]  CC: Primary care physician; Patient, No Pcp Per

## 2017-12-22 NOTE — H&P (View-Only) (Signed)
2 Days Post-Op   Subjective/Chief Complaint: Patient seen.  Still significant pain in the right foot.   Objective: Vital signs in last 24 hours: Temp:  [98.3 F (36.8 C)-98.6 F (37 C)] 98.6 F (37 C) (06/20 0349) Pulse Rate:  [86-99] 99 (06/20 0349) Resp:  [15-18] 15 (06/20 0349) BP: (110-130)/(75-98) 125/98 (06/20 0349) SpO2:  [95 %-98 %] 97 % (06/20 0349) Last BM Date: 12/21/17  Intake/Output from previous day: 06/19 0701 - 06/20 0700 In: 680 [P.O.:480; IV Piggyback:200] Out: 500 [Urine:500] Intake/Output this shift: Total I/O In: 240 [P.O.:240] Out: -   Mild drainage is noted on the bandaging.  Upon removal the right fifth toe is still dusky but there is still some capillary refill distally and the color looks a little bit better at the distal aspect as well.  There is some significant incisional necrosis dorsally over the fourth interspace incision as well as laterally at the fifth metatarsal area and a small amount along the plantar incision.  Erythema and edema has improved.  Lab Results:  Recent Labs    12/21/17 0550 12/22/17 0400  WBC 14.2* 10.8*  HGB 7.8* 7.6*  HCT 23.3* 22.5*  PLT 304 347   BMET Recent Labs    12/20/17 0338  NA 132*  K 4.1  CL 102  CO2 24  GLUCOSE 194*  BUN 19  CREATININE 1.18  CALCIUM 8.2*   PT/INR No results for input(s): LABPROT, INR in the last 72 hours. ABG No results for input(s): PHART, HCO3 in the last 72 hours.  Invalid input(s): PCO2, PO2  Studies/Results: No results found.  Anti-infectives: Anti-infectives (From admission, onward)   Start     Dose/Rate Route Frequency Ordered Stop   12/22/17 0000  vancomycin (VANCOCIN) 1,250 mg in sodium chloride 0.9 % 250 mL IVPB  Status:  Discontinued     1,250 mg 166.7 mL/hr over 90 Minutes Intravenous Every 18 hours 12/21/17 0658 12/21/17 1005   12/21/17 1800  vancomycin (VANCOCIN) IVPB 750 mg/150 ml premix     750 mg 150 mL/hr over 60 Minutes Intravenous Every 12 hours  12/21/17 1005     12/19/17 1800  vancomycin (VANCOCIN) IVPB 1000 mg/200 mL premix  Status:  Discontinued     1,000 mg 200 mL/hr over 60 Minutes Intravenous Every 18 hours 12/19/17 0948 12/21/17 0658   12/19/17 1045  aztreonam (AZACTAM) 2 g in sodium chloride 0.9 % 100 mL IVPB     2 g 200 mL/hr over 30 Minutes Intravenous Every 8 hours 12/19/17 1033     12/19/17 0000  vancomycin (VANCOCIN) IVPB 1000 mg/200 mL premix  Status:  Discontinued     1,000 mg 200 mL/hr over 60 Minutes Intravenous Every 24 hours 12/18/17 1403 12/19/17 0948   12/18/17 1445  vancomycin (VANCOCIN) IVPB 1000 mg/200 mL premix  Status:  Discontinued     1,000 mg 200 mL/hr over 60 Minutes Intravenous  Once 12/18/17 1442 12/18/17 1444   12/18/17 1300  vancomycin (VANCOCIN) IVPB 1000 mg/200 mL premix     1,000 mg 200 mL/hr over 60 Minutes Intravenous  Once 12/18/17 1248 12/18/17 1445      Assessment/Plan: s/p Procedure(s): IRRIGATION AND DEBRIDEMENT FOOT (Right) Assessment: Abscess right foot status post debridement with some necrosis.   Plan: Sterile dressing reapplied to the right foot.  At this point we will give it another day for demarcation but he will most likely need debridement of the right foot again this weekend to remove the areas of  necrosis.  Order put in for physical therapy for dispensing of crutches with crutch training for nonweightbearing on the right foot.  Reevaluate the wounds tomorrow.  LOS: 4 days    Shawn [name] 12/22/2017

## 2017-12-22 NOTE — Evaluation (Signed)
Physical Therapy Evaluation Patient Details Name: Shawn [name] MRN: [id number] DOB: 09-20-1985 Today's Date: 12/22/2017   History of Present Illness  pt admitted to the hospital on 12/18/17 where he was diagnosed with cellulitis of R foot secondary to a diabetic foot ulcer. Pt had irrigation/debredment of R foot performed on 12/20/17 by Dr. Cleda Mccreedy Pt has a past medical history that includes DM, GERD, IBS.    Clinical Impression  Pt is a pleasant 32 year old male who was admitted for irrigation procedure of abcess on R foot secondary to cellulitis. Pt performs bed mobility, transfers, and ambulation with independence to CGA using RW. Pt demonstrates deficits with strength, mobility, and safety awareness. Pt states that he has gotten up several times without nursing to go to the bathroom and displays poor awareness of NWB status. Pt fitted and instructed in use of crutches however due to poor safety was changed to a RW which increased awareness. Pt could benefit from continued skilled therapy to improve safety awareness and decrease risk of falls. Pt will be seen by PT at least 2x/week while admitted to the hospital.     Follow Up Recommendations Outpatient PT(once weight bearing)    Equipment Recommendations  Rolling walker with 5" wheels    Recommendations for Other Services       Precautions / Restrictions Precautions Precautions: None Restrictions Weight Bearing Restrictions: Yes RLE Weight Bearing: Non weight bearing      Mobility  Bed Mobility Overal bed mobility: Independent             General bed mobility comments: pt performs bed mobility safely and independently  Transfers Overall transfer level: Modified independent Equipment used: Rolling walker (2 wheeled);Crutches             General transfer comment: pt required verbal cuing and demonstration to sit>stand at both a RW and to crutches. Pt unsteady once standing at  crutches.  Ambulation/Gait Ambulation/Gait assistance: Min guard Gait Distance (Feet): 15 Feet Assistive device: Crutches;Rolling walker (2 wheeled)       General Gait Details: pt initally fitted and instructed for use with crutches however amb with crutches dislays poor safety awareness and inability to use crutches safely. Pt fitted and instructed in use of RW, pt able to amb 15' with RW and CGA demonstrating increased safety compared to RW. Pt still impulsive and requires continued verbal cuing and guarding to amb  Stairs            Wheelchair Mobility    Modified Rankin (Stroke Patients Only)       Balance Overall balance assessment: Needs assistance   Sitting balance-Leahy Scale: Normal Sitting balance - Comments: Normal sitting balance EOB with no support for extremities     Standing balance-Leahy Scale: Good Standing balance comment: Pt demonstrates poor standing balance with B crutches while NWB on R. Balance improved with B UE support on RW                             Pertinent Vitals/Pain Pain Assessment: 0-10 Pain Score: 8  Pain Location: R foot Pain Descriptors / Indicators: Sharp;Aching Pain Intervention(s): Limited activity within patient's tolerance;Monitored during session;Premedicated before session    Home Living Family/patient expects to be discharged to:: Private residence Living Arrangements: Parent;Children(dad, son, grandmother) Available Help at Discharge: Family Type of Home: House Home Access: Stairs to enter Entrance Stairs-Rails: None Entrance Stairs-Number of Steps: 1 Home Layout: One level  Home Equipment: None      Prior Function Level of Independence: Independent         Comments: pt independent without AD prior to admission     Hand Dominance        Extremity/Trunk Assessment   Upper Extremity Assessment Upper Extremity Assessment: Overall WFL for tasks assessed    Lower Extremity Assessment Lower  Extremity Assessment: RLE deficits/detail;LLE deficits/detail RLE Deficits / Details: Limited assessment due to pain with activity. knee flexion, knee extension, hip flexion 4+/5. RLE Sensation: WNL LLE Deficits / Details: grossly at least 4/5; knee flexion, knee extension, hip flexion, DF 4+/5. LLE Sensation: WNL       Communication   Communication: No difficulties  Cognition Arousal/Alertness: Awake/alert Behavior During Therapy: WFL for tasks assessed/performed Overall Cognitive Status: Within Functional Limits for tasks assessed                                        General Comments      Exercises Other Exercises Other Exercises: Pt instructed in standing balance exercises standing on L LE with single UE support reaching outside BOS. Attempted but held strength exercises at this time due to pts pain.   Assessment/Plan    PT Assessment Patient needs continued PT services  PT Problem List Decreased strength;Decreased range of motion;Decreased mobility;Decreased balance;Decreased activity tolerance;Decreased knowledge of use of DME;Decreased safety awareness;Pain       PT Treatment Interventions DME instruction;Gait training;Stair training;Functional mobility training;Therapeutic activities;Therapeutic exercise;Balance training;Patient/family education    PT Goals (Current goals can be found in the Care Plan section)  Acute Rehab PT Goals Patient Stated Goal: get back home and back to normal life PT Goal Formulation: With patient Time For Goal Achievement: 01/05/18 Potential to Achieve Goals: Good    Frequency Min 2X/week   Barriers to discharge        Co-evaluation               AM-PAC PT "6 Clicks" Daily Activity  Outcome Measure Difficulty turning over in bed (including adjusting bedclothes, sheets and blankets)?: None Difficulty moving from lying on back to sitting on the side of the bed? : None Difficulty sitting down on and standing up  from a chair with arms (e.g., wheelchair, bedside commode, etc,.)?: Unable Help needed moving to and from a bed to chair (including a wheelchair)?: A Little Help needed walking in hospital room?: A Little Help needed climbing 3-5 steps with a railing? : A Lot 6 Click Score: 17    End of Session Equipment Utilized During Treatment: Gait belt Activity Tolerance: Patient tolerated treatment well;Patient limited by pain Patient left: in bed;with call bell/phone within reach;with bed alarm set Nurse Communication: Mobility status PT Visit Diagnosis: Unsteadiness on feet (R26.81);Other abnormalities of gait and mobility (R26.89);Muscle weakness (generalized) (M62.81);Difficulty in walking, not elsewhere classified (R26.2);Pain Pain - Right/Left: Right Pain - part of body: Ankle and joints of foot    Time: 0147-0215 PT Time Calculation (min) (ACUTE ONLY): 28 min   Charges:         PT G Codes:        Jaimen Melone, SPT   Kaedan Richert 12/22/2017, 3:54 PM

## 2017-12-22 NOTE — Progress Notes (Signed)
2 Days Post-Op   Subjective/Chief Complaint: Patient seen.  Still significant pain in the right foot.   Objective: Vital signs in last 24 hours: Temp:  [98.3 F (36.8 C)-98.6 F (37 C)] 98.6 F (37 C) (06/20 0349) Pulse Rate:  [86-99] 99 (06/20 0349) Resp:  [15-18] 15 (06/20 0349) BP: (110-130)/(75-98) 125/98 (06/20 0349) SpO2:  [95 %-98 %] 97 % (06/20 0349) Last BM Date: 12/21/17  Intake/Output from previous day: 06/19 0701 - 06/20 0700 In: 680 [P.O.:480; IV Piggyback:200] Out: 500 [Urine:500] Intake/Output this shift: Total I/O In: 240 [P.O.:240] Out: -   Mild drainage is noted on the bandaging.  Upon removal the right fifth toe is still dusky but there is still some capillary refill distally and the color looks a little bit better at the distal aspect as well.  There is some significant incisional necrosis dorsally over the fourth interspace incision as well as laterally at the fifth metatarsal area and a small amount along the plantar incision.  Erythema and edema has improved.  Lab Results:  Recent Labs    12/21/17 0550 12/22/17 0400  WBC 14.2* 10.8*  HGB 7.8* 7.6*  HCT 23.3* 22.5*  PLT 304 347   BMET Recent Labs    12/20/17 0338  NA 132*  K 4.1  CL 102  CO2 24  GLUCOSE 194*  BUN 19  CREATININE 1.18  CALCIUM 8.2*   PT/INR No results for input(s): LABPROT, INR in the last 72 hours. ABG No results for input(s): PHART, HCO3 in the last 72 hours.  Invalid input(s): PCO2, PO2  Studies/Results: No results found.  Anti-infectives: Anti-infectives (From admission, onward)   Start     Dose/Rate Route Frequency Ordered Stop   12/22/17 0000  vancomycin (VANCOCIN) 1,250 mg in sodium chloride 0.9 % 250 mL IVPB  Status:  Discontinued     1,250 mg 166.7 mL/hr over 90 Minutes Intravenous Every 18 hours 12/21/17 0658 12/21/17 1005   12/21/17 1800  vancomycin (VANCOCIN) IVPB 750 mg/150 ml premix     750 mg 150 mL/hr over 60 Minutes Intravenous Every 12 hours  12/21/17 1005     12/19/17 1800  vancomycin (VANCOCIN) IVPB 1000 mg/200 mL premix  Status:  Discontinued     1,000 mg 200 mL/hr over 60 Minutes Intravenous Every 18 hours 12/19/17 0948 12/21/17 0658   12/19/17 1045  aztreonam (AZACTAM) 2 g in sodium chloride 0.9 % 100 mL IVPB     2 g 200 mL/hr over 30 Minutes Intravenous Every 8 hours 12/19/17 1033     12/19/17 0000  vancomycin (VANCOCIN) IVPB 1000 mg/200 mL premix  Status:  Discontinued     1,000 mg 200 mL/hr over 60 Minutes Intravenous Every 24 hours 12/18/17 1403 12/19/17 0948   12/18/17 1445  vancomycin (VANCOCIN) IVPB 1000 mg/200 mL premix  Status:  Discontinued     1,000 mg 200 mL/hr over 60 Minutes Intravenous  Once 12/18/17 1442 12/18/17 1444   12/18/17 1300  vancomycin (VANCOCIN) IVPB 1000 mg/200 mL premix     1,000 mg 200 mL/hr over 60 Minutes Intravenous  Once 12/18/17 1248 12/18/17 1445      Assessment/Plan: s/p Procedure(s): IRRIGATION AND DEBRIDEMENT FOOT (Right) Assessment: Abscess right foot status post debridement with some necrosis.   Plan: Sterile dressing reapplied to the right foot.  At this point we will give it another day for demarcation but he will most likely need debridement of the right foot again this weekend to remove the areas of  necrosis.  Order put in for physical therapy for dispensing of crutches with crutch training for nonweightbearing on the right foot.  Reevaluate the wounds tomorrow.  LOS: 4 days    Shawn [name] 12/22/2017

## 2017-12-23 ENCOUNTER — Encounter
Admission: EM | Disposition: A | Discharge status: Home / Self Care | Payer: Self-pay | Source: Home / Self Care | Attending: Specialist

## 2017-12-23 ENCOUNTER — Encounter: Payer: Self-pay | Admitting: Certified Registered"

## 2017-12-23 DIAGNOSIS — L03115 Cellulitis of right lower limb: Secondary | ICD-10-CM

## 2017-12-23 DIAGNOSIS — E11621 Type 2 diabetes mellitus with foot ulcer: Secondary | ICD-10-CM

## 2017-12-23 DIAGNOSIS — I70238 Atherosclerosis of native arteries of right leg with ulceration of other part of lower right leg: Secondary | ICD-10-CM

## 2017-12-23 HISTORY — PX: ABDOMINAL AORTOGRAM W/LOWER EXTREMITY: CATH118223

## 2017-12-23 LAB — CULTURE, BLOOD (ROUTINE X 2)
CULTURE: NO GROWTH
Culture: NO GROWTH
Culture: NO GROWTH
Culture: NO GROWTH
SPECIAL REQUESTS: ADEQUATE
Special Requests: ADEQUATE
Special Requests: ADEQUATE
Special Requests: ADEQUATE

## 2017-12-23 LAB — GLUCOSE, CAPILLARY
GLUCOSE-CAPILLARY: 141 mg/dL — AB (ref 65–99)
Glucose-Capillary: 175 mg/dL — ABNORMAL HIGH (ref 65–99)
Glucose-Capillary: 172 mg/dL — ABNORMAL HIGH (ref 65–99)
Glucose-Capillary: 133 mg/dL — ABNORMAL HIGH (ref 65–99)
Glucose-Capillary: 122 mg/dL — ABNORMAL HIGH (ref 65–99)
Glucose-Capillary: 296 mg/dL — ABNORMAL HIGH (ref 65–99)

## 2017-12-23 LAB — AEROBIC/ANAEROBIC CULTURE (SURGICAL/DEEP WOUND): GRAM STAIN: NONE SEEN

## 2017-12-23 LAB — AEROBIC/ANAEROBIC CULTURE W GRAM STAIN (SURGICAL/DEEP WOUND)

## 2017-12-23 LAB — VANCOMYCIN, TROUGH: Vancomycin Tr: 19 ug/mL (ref 15–20)

## 2017-12-23 SURGERY — ABDOMINAL AORTOGRAM W/LOWER EXTREMITY
Anesthesia: Moderate Sedation | Laterality: Right

## 2017-12-23 MED ORDER — FAMOTIDINE 20 MG PO TABS
ORAL_TABLET | ORAL | Status: AC
  Administered 2017-12-23: 40 mg via ORAL
  Filled 2017-12-23: qty 2

## 2017-12-23 MED ORDER — MIDAZOLAM HCL 2 MG/2ML IJ SOLN
INTRAMUSCULAR | Status: DC | PRN
  Administered 2017-12-23: 2 mg via INTRAVENOUS

## 2017-12-23 MED ORDER — SODIUM CHLORIDE 0.9 % IV SOLN: INTRAVENOUS | Status: AC

## 2017-12-23 MED ORDER — DIPHENHYDRAMINE HCL 50 MG/ML IJ SOLN
INTRAMUSCULAR | Status: AC
  Filled 2017-12-23: qty 1

## 2017-12-23 MED ORDER — METHYLPREDNISOLONE SODIUM SUCC 125 MG IJ SOLR
INTRAMUSCULAR | Status: AC
  Administered 2017-12-23: 125 mg via INTRAVENOUS
  Filled 2017-12-23: qty 2

## 2017-12-23 MED ORDER — METHYLPREDNISOLONE SODIUM SUCC 125 MG IJ SOLR
125.0000 mg | Freq: Once | INTRAMUSCULAR | Status: AC
  Administered 2017-12-23: 125 mg via INTRAVENOUS

## 2017-12-23 MED ORDER — LIDOCAINE-EPINEPHRINE (PF) 1 %-1:200000 IJ SOLN
INTRAMUSCULAR | Status: AC
  Filled 2017-12-23: qty 30

## 2017-12-23 MED ORDER — HEPARIN (PORCINE) IN NACL 1000-0.9 UT/500ML-% IV SOLN
INTRAVENOUS | Status: AC
  Filled 2017-12-23: qty 1000

## 2017-12-23 MED ORDER — CLINDAMYCIN PHOSPHATE 300 MG/50ML IV SOLN
INTRAVENOUS | Status: AC
  Administered 2017-12-23: 300 mg via INTRAVENOUS
  Filled 2017-12-23: qty 50

## 2017-12-23 MED ORDER — IOPAMIDOL (ISOVUE-300) INJECTION 61%
INTRAVENOUS | Status: DC | PRN
  Administered 2017-12-23: 35 mL via INTRA_ARTERIAL

## 2017-12-23 MED ORDER — DIPHENHYDRAMINE HCL 50 MG/ML IJ SOLN
INTRAMUSCULAR | Status: DC | PRN
  Administered 2017-12-23: 25 mg via INTRAVENOUS

## 2017-12-23 MED ORDER — HEPARIN SODIUM (PORCINE) 1000 UNIT/ML IJ SOLN
INTRAMUSCULAR | Status: AC
  Filled 2017-12-23: qty 1

## 2017-12-23 MED ORDER — LIDOCAINE HCL (PF) 1 % IJ SOLN
INTRAMUSCULAR | Status: AC
  Filled 2017-12-23: qty 30

## 2017-12-23 MED ORDER — SODIUM CHLORIDE 0.9 % IV SOLN
INTRAVENOUS | Status: DC
  Administered 2017-12-23 – 2017-12-25 (×4): via INTRAVENOUS

## 2017-12-23 MED ORDER — FENTANYL CITRATE (PF) 100 MCG/2ML IJ SOLN
INTRAMUSCULAR | Status: AC
  Filled 2017-12-23: qty 2

## 2017-12-23 MED ORDER — SODIUM CHLORIDE 0.9 % IV SOLN: 250.0000 mL | INTRAVENOUS | Status: DC | PRN

## 2017-12-23 MED ORDER — SODIUM CHLORIDE 0.9% FLUSH
3.0000 mL | Freq: Two times a day (BID) | INTRAVENOUS | Status: DC
  Administered 2017-12-25 – 2017-12-27 (×5): 3 mL via INTRAVENOUS

## 2017-12-23 MED ORDER — FAMOTIDINE 20 MG PO TABS
40.0000 mg | ORAL_TABLET | Freq: Once | ORAL | Status: AC
  Administered 2017-12-23: 40 mg via ORAL

## 2017-12-23 MED ORDER — CLINDAMYCIN PHOSPHATE 300 MG/50ML IV SOLN
300.0000 mg | INTRAVENOUS | Status: AC
  Administered 2017-12-23: 300 mg via INTRAVENOUS

## 2017-12-23 MED ORDER — FENTANYL CITRATE (PF) 100 MCG/2ML IJ SOLN
INTRAMUSCULAR | Status: DC | PRN
  Administered 2017-12-23: 50 ug via INTRAVENOUS

## 2017-12-23 MED ORDER — PROMETHAZINE HCL 25 MG/ML IJ SOLN
25.0000 mg | Freq: Four times a day (QID) | INTRAMUSCULAR | Status: DC | PRN
  Administered 2017-12-23 – 2017-12-27 (×6): 25 mg via INTRAVENOUS
  Filled 2017-12-23 (×7): qty 1

## 2017-12-23 MED ORDER — MIDAZOLAM HCL 5 MG/5ML IJ SOLN
INTRAMUSCULAR | Status: AC
  Filled 2017-12-23: qty 5

## 2017-12-23 MED ORDER — SODIUM CHLORIDE 0.9% FLUSH
3.0000 mL | INTRAVENOUS | Status: DC | PRN
  Administered 2017-12-24 – 2017-12-25 (×5): 3 mL via INTRAVENOUS
  Filled 2017-12-23 (×5): qty 3

## 2017-12-23 SURGICAL SUPPLY — 10 items
CATH PIG 70CM (CATHETERS) ×3 IMPLANT
DEVICE CLOSURE MYNXGRIP 5F (Vascular Products) ×3 IMPLANT
DEVICE TORQUE .025-.038 (MISCELLANEOUS) ×3 IMPLANT
NEEDLE ENTRY 21GA 7CM ECHOTIP (NEEDLE) ×3 IMPLANT
PACK ANGIOGRAPHY (CUSTOM PROCEDURE TRAY) ×3 IMPLANT
SET INTRO CAPELLA COAXIAL (SET/KITS/TRAYS/PACK) ×3 IMPLANT
SHEATH BRITE TIP 5FRX11 (SHEATH) ×3 IMPLANT
TUBING CONTRAST HIGH PRESS 72 (TUBING) ×3 IMPLANT
WIRE AQUATRACK .035X260CM (WIRE) ×3 IMPLANT
WIRE J 3MM .035X145CM (WIRE) ×3 IMPLANT

## 2017-12-23 NOTE — Progress Notes (Signed)
[facility] [place] NAME: Shawn [name]    MR#:  [id number]  DATE OF BIRTH:  1986/01/29  SUBJECTIVE:   Patient is status post incision and drainage of a diabetic foot ulcer postop day #3 today.  Some persistent nausea and vomiting not improving with Zofran.  No abdominal pain.  Plan for angiogram of the right lower extremity as patient's wound is not healing appropriately.  Plan for possible further debridement of the wound tomorrow.  REVIEW OF SYSTEMS:    Review of Systems  Constitutional: Negative for chills and fever.  HENT: Negative for congestion and tinnitus.   Eyes: Negative for blurred vision and double vision.  Respiratory: Negative for cough, shortness of breath and wheezing.   Cardiovascular: Negative for chest pain, orthopnea and PND.  Gastrointestinal: Negative for abdominal pain, diarrhea, nausea and vomiting.  Genitourinary: Negative for dysuria and hematuria.  Musculoskeletal: Positive for joint pain (Right Foot).  Neurological: Negative for dizziness, sensory change and focal weakness.  All other systems reviewed and are negative.   Nutrition: Carb control/Heart healthy Tolerating Diet: Yes Tolerating PT: Eval noted.   DRUG ALLERGIES:   Allergies  Allergen Reactions  . Banana Hives, Nausea And Vomiting and Rash  . Keflex [Cephalexin] Anaphylaxis  . Onion Hives, Nausea And Vomiting and Rash  . Sulfa Antibiotics Anaphylaxis  . Grapeseed Extract [Nutritional Supplements] Itching  . Shellfish Allergy Hives    "ALL SEAFOOD"    VITALS:  Blood pressure (!) 128/93, pulse 96, temperature 98.3 F (36.8 C), temperature source Oral, resp. rate 15, height 5\' 6"  (1.676 m), weight 52.2 kg (115 lb), SpO2 100 %.  PHYSICAL EXAMINATION:   Physical Exam  GENERAL:  32 y.o.-year-old patient lying in bed in no acute distress.  EYES: Pupils equal, round, reactive to light and accommodation. No scleral icterus. Extraocular muscles  intact.  HEENT: Head atraumatic, normocephalic. Oropharynx and nasopharynx clear.  NECK:  Supple, no jugular venous distention. No thyroid enlargement, no tenderness.  LUNGS: Normal breath sounds bilaterally, no wheezing, rales, rhonchi. No use of accessory muscles of respiration.  CARDIOVASCULAR: S1, S2 normal. No murmurs, rubs, or gallops.  ABDOMEN: Soft, nontender, nondistended. Bowel sounds present. No organomegaly or mass.  EXTREMITIES: No cyanosis, clubbing or edema b/l. Right foot dressing in place from surgery . NEUROLOGIC: Cranial nerves II through XII are intact. No focal Motor or sensory deficits b/l.   PSYCHIATRIC: The patient is alert and oriented x 3.  SKIN: No obvious rash, lesion  LABORATORY PANEL:   CBC Recent Labs  Lab 12/22/17 0400  WBC 10.8*  HGB 7.6*  HCT 22.5*  PLT 347   ------------------------------------------------------------------------------------------------------------------  Chemistries  Recent Labs  Lab 12/18/17 1608  12/20/17 0338  NA 132*   < > 132*  K 3.7   < > 4.1  CL 99*   < > 102  CO2 24   < > 24  GLUCOSE 85   < > 194*  BUN 30*   < > 19  CREATININE 1.21   < > 1.18  CALCIUM 8.2*   < > 8.2*  AST 19  --   --   ALT 12*  --   --   ALKPHOS 67  --   --   BILITOT 0.5  --   --    < > = values in this interval not displayed.   ------------------------------------------------------------------------------------------------------------------  Cardiac Enzymes No results for input(s): TROPONINI in the last 168 hours. ------------------------------------------------------------------------------------------------------------------  RADIOLOGY:  No results found.   ASSESSMENT AND PLAN:   32 year old male with past medical history of diabetes, bowel syndrome, GERD who presented to the hospital due to right foot swelling pain and redness.  1.  Right diabetic foot ulcer-this was the cause of patient's redness swelling and pain.   -Patient's  MRI showed no evidence of osteomyelitis.   - s/p I & D and debridement by Podiatry POD # 3 today.  Wound cultures are positive for group B strep and MSSA. -We will DC aztreonam, continue vancomycin. - Vascular surgery consulted and plan for angiogram today.  Podiatry wants to do repeat debridement tomorrow.  2.  Uncontrolled diabetes mellitus with hyperglycemia-continue Lantus, Novolog with meals and cont. sliding scale insulin and follow BS.  - appreciate Diabetes Coordinator input   3.  Depression/Anxiety-continue Prozac/Xanax  4.  Leukocytosis-secondary to underlying infection.  Improving w/ IV abx.   5. Anemia - Normocytic anemia. Anemia Panel consistent with Anemia of Chronic disease.  - follow Hg. No need for transfusion  6. N/V - due to narcotics.  - cont. Zofran/phenergan.   All the records are reviewed and case discussed with Care Management/Social Worker. Management plans discussed with the patient, family and they are in agreement.  CODE STATUS: Full code  DVT Prophylaxis: Lovenox  TOTAL TIME TAKING CARE OF THIS PATIENT: 30 minutes.   POSSIBLE D/C IN 2-3 DAYS, DEPENDING ON CLINICAL CONDITION.   Henreitta Leber M.D on 12/23/2017 at 3:45 PM  Between 7am to 6pm - Pager - [phone]  After 6pm go to [email or web address] - Proofreader  [facility] [facility]  Office  [phone]  CC: Primary care physician; Patient, No Pcp Per

## 2017-12-23 NOTE — Op Note (Signed)
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  Abdominal aortogram  2.  Right lower extremity distal runoff third order catheter placement  3.  Minx closure left common femoral    Anesthesia: Conscious sedation was administered by the interventional radiology RN under my direct supervision. IV Versed plus fentanyl were utilized. Continuous ECG, pulse oximetry and blood pressure was monitored throughout the entire procedure.  Conscious sedation was administered for a total of 27 minutes.  Sheath: 5 French Pinnacle sheath left common femoral  Contrast: 45 cc   Fluoroscopy Time: 3 minutes  Indications:  The patient [facility] to [facility] with ulceration of the right foot.  Pedal pulses are trace palpable bilaterally and arterial calcifications are readily seen on plain film suggesting atherosclerotic occlusive disease.  The risks and benefits as well as alternative therapies for lower extremity revascularization are reviewed with the patient all questions are answered the patient agrees to proceed.  The patient is therefore undergoing angiography with the hope for intervention for limb salvage.   Procedure:  Mcguire Gasparyan Norrisis a 32 y.o. male who was identified and appropriate procedural time out was performed.  The patient was then placed supine on the table and prepped and draped in the usual sterile fashion.  Ultrasound was used to evaluate the left common femoral artery.  It was echolucent and pulsatile indicating it is patent .  An ultrasound image was acquired for the permanent record.  A micropuncture needle was used to access the left common femoral artery under direct ultrasound guidance.  The microwire was then advanced  under fluoroscopic guidance without difficulty followed by the micro-sheath.  A 0.035 J wire was advanced without resistance and a 5Fr sheath was placed.    Pigtail catheter was then advanced to the level of T12 and AP projection of the aorta was obtained. Pigtail catheter was then repositioned to above the bifurcation and LAO view of the pelvis was obtained. Stiff angled Glidewire and pigtail catheter was then used across the bifurcation and the catheter was positioned in the distal external iliac artery.  RAO of the right groin was then obtained. Wire was reintroduced and negotiated into the SFA and the catheter was advanced into the SFA. Distal runoff was then performed.  After review of the images the catheter was removed over wire and an LAO view of the groin was obtained.  Minx device was deployed without difficulty.   Findings:   Aortogram: The abdominal aorta is widely patent with mild atherosclerotic changes.  The renal arteries are single and widely patent.  Normal nephrograms are noted bilaterally.  Right Lower Extremity: The right common femoral superficial femoral popliteal are widely patent with minimal atherosclerotic changes.  Profunda femoris is widely patent as well.  Trifurcation is patent and there is three-vessel runoff.  The anterior tibial and posterior tibial cross the ankle and the dorsalis pedis and plantar vessels fill the pedal arch.  Peroneal becomes diminutive at the ankle and collateralizes poorly to the rest of the foot.  Evaluation of the pedal arch shows that it is not intact.  The dorsalis pedis and plantar vessels fill the arch across the first second and third rays.  Essentially the fourth and fifth rays have very poor flow digital vessels are absent in general there is a  paucity of filling of the lateral aspect of the foot.  This correlates exactly with his necrotic tissue and suggests very poor healing potential in the lateral portion of the foot.    Disposition:  Patient was taken to the recovery room in stable condition having tolerated the procedure well.  Belenda Cruise Ellaina Schuler 12/23/2017,7:04 PM

## 2017-12-23 NOTE — Progress Notes (Signed)
Pharmacy Antibiotic Note  Shawn [name] is a 32 y.o. male admitted on 12/18/2017 with cellulitis.  Pharmacy has been consulted for vancomycin dosing. He was recently discharged from [facility] after being treated for DKA. His Scr is modestly elevated on this admission with a baseline near 1   Plan: Vancomycin trough therapeutic at 19 Continue current dose of vancomycin 750mg  q 12 hr Will need to continue to follow cultures and abx plan  Height: 5\' 6"  (167.6 cm) Weight: 115 lb (52.2 kg) IBW/kg (Calculated) : 63.8  Temp (24hrs), Avg:98.2 F (36.8 C), Min:98 F (36.7 C), Max:98.4 F (36.9 C)  Recent Labs  Lab 12/18/17 1221 12/18/17 1255 12/18/17 1547 12/18/17 1608 12/18/17 2026 12/19/17 0341 12/20/17 0338 12/21/17 0550 12/22/17 0400 12/23/17 1847  WBC 23.0*  --   --  19.6*  --  17.1* 18.2* 14.2* 10.8*  --   CREATININE 1.39*  --   --  1.21  --  1.30* 1.18  --   --   --   LATICACIDVEN  --  1.0 1.0  --  0.6  --   --   --   --   --   VANCOTROUGH  --   --   --   --   --   --   --  13*  --  19    Estimated Creatinine Clearance: 67.6 mL/min (by C-G formula based on SCr of 1.18 mg/dL).    Allergies  Allergen Reactions  . Banana Hives, Nausea And Vomiting and Rash  . Keflex [Cephalexin] Anaphylaxis  . Onion Hives, Nausea And Vomiting and Rash  . Sulfa Antibiotics Anaphylaxis  . Grapeseed Extract [Nutritional Supplements] Itching  . Shellfish Allergy Hives    "ALL SEAFOOD"    Antimicrobials this admission: Vancomycin 6/16 >>   Microbiology results: 6/16 BCx: pending  Thank you for allowing pharmacy to be a part of this patient's care.  Ramond Dial, PharmD 12/23/2017 7:27 PM

## 2017-12-23 NOTE — Care Management (Signed)
Patient for surgical debridement procedure today.

## 2017-12-23 NOTE — Consult Note (Signed)
[facility] Consult Note  MRN : [id number]  Shawn [name] is a 32 y.o. ([birth year]) male who presents with chief complaint of  Chief Complaint  Patient presents with  . Foot Injury  .  History of Present Illness:   I am asked to evaluate the patient by Dr. Cleda Mccreedy.  The patient is a 32 year old poorly controlled diabetic admitted on December 18, 2017 with cellulitis of the right foot secondary to a ulceration.  Patient underwent debridement of the right foot on 12/20/2017.  Postoperatively Dr. Cleda Mccreedy has noted that the incisions do not seem to be improving and that the patient does not appear to be clearing his infection in a manner that would be consistent with adequate circulation.  On x-rays he is noted to have significant calcification of his arteries.  Given the circumstances there is now concern regarding his perfusion and his ability to heal his wounds.  At the time of my interview the patient denies rest pain symptoms.  He has not experienced claudication in the past.  Current Meds  Medication Sig  . FLUoxetine (PROZAC) 20 MG capsule Take 1 capsule (20 mg total) by mouth daily.  . insulin glargine (LANTUS) 100 UNIT/ML injection Inject 0.22 mLs (22 Units total) into the skin daily. (Patient taking differently: Inject 30 Units into the skin daily. )  . lisinopril (PRINIVIL,ZESTRIL) 10 MG tablet Take 1 tablet (10 mg total) by mouth daily.  . metoCLOPramide (REGLAN) 10 MG tablet Take 1 tablet (10 mg total) by mouth 3 (three) times daily before meals.    Past Medical History:  Diagnosis Date  . Diabetes mellitus without complication ([place])   . GERD (gastroesophageal reflux disease)   . IBS (irritable bowel syndrome)     Past Surgical History:  Procedure Laterality Date  . IRRIGATION AND DEBRIDEMENT FOOT Right 12/20/2017   Procedure: IRRIGATION AND DEBRIDEMENT FOOT;  Surgeon: Sharlotte Alamo, DPM;  Location: [facility] ORS;  Service: Podiatry;  Laterality:  Right;    Social History Social History   Tobacco Use  . Smoking status: Never Smoker  . Smokeless tobacco: Never Used  Substance Use Topics  . Alcohol use: No    Frequency: Never  . Drug use: No    Family History Family History  Problem Relation Age of Onset  . Diabetes Mother   . Ovarian cancer Mother   No family history of bleeding/clotting disorders, porphyria or autoimmune disease   Allergies  Allergen Reactions  . Banana Hives, Nausea And Vomiting and Rash  . Keflex [Cephalexin] Anaphylaxis  . Onion Hives, Nausea And Vomiting and Rash  . Sulfa Antibiotics Anaphylaxis  . Grapeseed Extract [Nutritional Supplements] Itching     REVIEW OF SYSTEMS (Negative unless checked)  Constitutional: [] Weight loss  [] Fever  [] Chills Cardiac: [] Chest pain   [] Chest pressure   [] Palpitations   [] Shortness of breath when laying flat   [] Shortness of breath at rest   [] Shortness of breath with exertion. Vascular:  [] Pain in legs with walking   [] Pain in legs at rest   [] Pain in legs when laying flat   [] Claudication   [] Pain in feet when walking  [] Pain in feet at rest  [] Pain in feet when laying flat   [] History of DVT   [] Phlebitis   [] Swelling in legs   [] Varicose veins   [x] Non-healing ulcers Pulmonary:   [] Uses home oxygen   [] Productive cough   [] Hemoptysis   [] Wheeze  [] COPD   [] Asthma Neurologic:  [] Dizziness  []   Blackouts   [] Seizures   [] History of stroke   [] History of TIA  [] Aphasia   [] Temporary blindness   [] Dysphagia   [] Weakness or numbness in arms   [] Weakness or numbness in legs Musculoskeletal:  [] Arthritis   [] Joint swelling   [] Joint pain   [] Low back pain Hematologic:  [] Easy bruising  [] Easy bleeding   [] Hypercoagulable state   [] Anemic  [] Hepatitis Gastrointestinal:  [] Blood in stool   [] Vomiting blood  [x] Gastroesophageal reflux/heartburn   [] Difficulty swallowing. Genitourinary:  [] Chronic kidney disease   [] Difficult urination  [] Frequent urination  [] Burning  with urination   [] Blood in urine Skin:  [] Rashes   [x] Ulcers   [x] Wounds Psychological:  [] History of anxiety   []  History of major depression.  Physical Examination  Vitals:   12/22/17 1440 12/22/17 1744 12/22/17 1934 12/23/17 0448  BP: 114/78 (!) 135/96 115/84 131/88  Pulse: 99 96 88 92  Resp:   (!) 21   Temp: 98.2 F (36.8 C) 98.5 F (36.9 C) 98 F (36.7 C) 98.4 F (36.9 C)  TempSrc: Oral Oral Oral Oral  SpO2: 98%  97% 99%  Weight:      Height:       Body mass index is 18.56 kg/m. Gen:  WD/WN, NAD Head: /AT, No temporalis wasting. Prominent temp pulse not noted. Ear/Nose/Throat: Hearing grossly intact, nares w/o erythema or drainage, oropharynx w/o Erythema/Exudate Eyes: PERRLA, EOMI.  Neck: Supple, no nuchal rigidity.  No bruit or JVD.  Pulmonary:  Good air movement, clear to auscultation bilaterally.  Cardiac: RRR, normal S1, S2, no Murmurs, rubs or gallops. Vascular: Multiple incisions right foot right foot with erythema induration and swelling Vessel Right Left  Radial Palpable Palpable  PT Trace Palpable Trace Palpable  DP Trace Palpable Trace Palpable   Gastrointestinal: soft, non-tender/non-distended. No guarding/reflex. No masses, surgical incisions, or scars. Musculoskeletal: M/S 5/5 throughout.  + edema. Neurologic: CN 2-12 intact. Pain and light touch intact in extremities.  Symmetrical.  Speech is fluent. Motor exam as listed above. Psychiatric: Judgment intact, Mood & affect appropriate for pt's clinical situation. Dermatologic: No rashes + ulcers noted.  No cellulitis or open wounds. Lymph : No Cervical, Axillary, or Inguinal lymphadenopathy.    CBC Lab Results  Component Value Date   WBC 10.8 (H) 12/22/2017   HGB 7.6 (L) 12/22/2017   HCT 22.5 (L) 12/22/2017   MCV 83.1 12/22/2017   PLT 347 12/22/2017    BMET    Component Value Date/Time   NA 132 (L) 12/20/2017 0338   NA 132 (L) 10/06/2014 0539   K 4.1 12/20/2017 0338   K 3.4 (L)  10/06/2014 0539   CL 102 12/20/2017 0338   CL 91 (L) 10/06/2014 0539   CO2 24 12/20/2017 0338   CO2 26 10/06/2014 0539   GLUCOSE 194 (H) 12/20/2017 0338   GLUCOSE 634 (HH) 10/06/2014 0539   BUN 19 12/20/2017 0338   BUN 16 10/06/2014 0539   CREATININE 1.18 12/20/2017 0338   CREATININE 1.04 10/06/2014 0539   CALCIUM 8.2 (L) 12/20/2017 0338   CALCIUM 10.0 10/06/2014 0539   GFRNONAA >60 12/20/2017 0338   GFRNONAA >60 10/06/2014 0539   GFRAA >60 12/20/2017 0338   GFRAA >60 10/06/2014 0539   Estimated Creatinine Clearance: 67.6 mL/min (by C-G formula based on SCr of 1.18 mg/dL).  COAG Lab Results  Component Value Date   INR 1.00 12/18/2017    Radiology Mr Foot Right Wo Contrast  Result Date: 12/19/2017 CLINICAL DATA:  Right  foot pain and swelling. EXAM: MRI OF THE RIGHT FOREFOOT WITHOUT CONTRAST TECHNIQUE: Multiplanar, multisequence MR imaging of the right forefoot was performed. No intravenous contrast was administered. COMPARISON:  None. FINDINGS: Bones/Joint/Cartilage Soft tissue wound overlying the fifth MTP joint. No periosteal reaction or bone destruction. No focal marrow signal abnormality. No fracture or dislocation. Normal alignment. Small first MTP joint effusion. Soft tissue swelling involving the fifth digit. Ligaments Collateral ligaments are intact.  Lisfranc ligament is intact. Muscles and Tendons Flexor, peroneal and extensor compartment tendons are intact. T2 hyperintense muscle signal likely neurogenic. Soft tissue Small fluid collection along the dorsal aspect of fourth MTP joint measuring approximately 13 x 12 mm.No soft tissue mass. IMPRESSION: 1. No osteomyelitis of the right forefoot. Soft tissue wound overlying the fifth MTP joint with surrounding cellulitis. 13 x 12 mm fluid collection along the dorsal aspect of the fourth MTP joint which may reflect a small abscess. Electronically Signed   By: Kathreen Devoid   On: 12/19/2017 12:25   Dg Foot Complete Right  Result  Date: 12/18/2017 CLINICAL DATA:  Injury, pain and redness of 4th and 5th toes EXAM: RIGHT FOOT COMPLETE - 3+ VIEW COMPARISON:  None. FINDINGS: Soft tissue swelling over the distal right lateral foot and 5th toe. No acute fracture, subluxation or dislocation. Joint spaces are maintained. IMPRESSION: No acute bony abnormality. Electronically Signed   By: Rolm Baptise M.D.   On: 12/18/2017 13:16     Assessment/Plan 1.  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  The risks and benefits of been reviewed with the patient all questions have been answered the patient agrees to proceed.  Angiography will be scheduled for later today.  2.  Diabetic foot ulcer with abscess: The patient is planned for further wound debridement per Dr. Cleda Mccreedy  3.  Diabetes mellitus: Continue hypoglycemic medications as already ordered, these medications have been reviewed and there are no changes at this time.  Hgb A1C to be monitored as already arranged by primary service  4.  GERD: Continue antihypertensive medications as already ordered, these medications have been reviewed and there are no changes at this time.  Avoidence of caffeine and alcohol  Moderate elevation of the head of the bed     Hortencia Pilar, MD  12/23/2017 1:32 PM

## 2017-12-24 ENCOUNTER — Inpatient Hospital Stay: Payer: BLUE CROSS/BLUE SHIELD | Admitting: Anesthesiology

## 2017-12-24 ENCOUNTER — Encounter: Payer: Self-pay | Admitting: Anesthesiology

## 2017-12-24 ENCOUNTER — Encounter
Admission: EM | Disposition: A | Discharge status: Home / Self Care | Payer: Self-pay | Source: Home / Self Care | Attending: Specialist

## 2017-12-24 HISTORY — PX: IRRIGATION AND DEBRIDEMENT FOOT: SHX6602

## 2017-12-24 HISTORY — PX: AMPUTATION: SHX166

## 2017-12-24 HISTORY — PX: APPLICATION OF WOUND VAC: SHX5189

## 2017-12-24 LAB — GLUCOSE, CAPILLARY
GLUCOSE-CAPILLARY: 312 mg/dL — AB (ref 65–99)
Glucose-Capillary: 360 mg/dL — ABNORMAL HIGH (ref 65–99)
Glucose-Capillary: 314 mg/dL — ABNORMAL HIGH (ref 65–99)
Glucose-Capillary: 281 mg/dL — ABNORMAL HIGH (ref 65–99)
Glucose-Capillary: 313 mg/dL — ABNORMAL HIGH (ref 65–99)

## 2017-12-24 LAB — CBC
HCT: 25.8 % — ABNORMAL LOW (ref 40.0–52.0)
HEMOGLOBIN: 8.6 g/dL — AB (ref 13.0–18.0)
MCH: 27.9 pg (ref 26.0–34.0)
MCHC: 33.3 g/dL (ref 32.0–36.0)
MCV: 83.9 fL (ref 80.0–100.0)
Platelets: 477 10*3/uL — ABNORMAL HIGH (ref 150–440)
RBC: 3.08 MIL/uL — ABNORMAL LOW (ref 4.40–5.90)
RDW: 13.6 % (ref 11.5–14.5)
WBC: 10.9 10*3/uL — ABNORMAL HIGH (ref 3.8–10.6)

## 2017-12-24 LAB — HEMOGLOBIN AND HEMATOCRIT, BLOOD
HCT: 24.3 % — ABNORMAL LOW (ref 40.0–52.0)
Hemoglobin: 8 g/dL — ABNORMAL LOW (ref 13.0–18.0)

## 2017-12-24 LAB — CREATININE, SERUM
Creatinine, Ser: 1.4 mg/dL — ABNORMAL HIGH (ref 0.61–1.24)
GFR calc Af Amer: >60 mL/min (ref >60)
GFR calc non Af Amer: >60 mL/min (ref >60)

## 2017-12-24 SURGERY — IRRIGATION AND DEBRIDEMENT FOOT
Anesthesia: General | Site: Foot | Laterality: Right | Wound class: "Dirty or Infected "

## 2017-12-24 MED ORDER — PHENYLEPHRINE HCL 10 MG/ML IJ SOLN
INTRAMUSCULAR | Status: DC | PRN
  Administered 2017-12-24 (×2): 100 ug via INTRAVENOUS

## 2017-12-24 MED ORDER — OXYCODONE HCL 5 MG/5ML PO SOLN: 5.0000 mg | Freq: Once | ORAL | Status: DC | PRN

## 2017-12-24 MED ORDER — FENTANYL CITRATE (PF) 100 MCG/2ML IJ SOLN
INTRAMUSCULAR | Status: AC
  Filled 2017-12-24: qty 2

## 2017-12-24 MED ORDER — INSULIN ASPART 100 UNIT/ML ~~LOC~~ SOLN
7.0000 [IU] | Freq: Once | SUBCUTANEOUS | Status: AC
  Administered 2017-12-24: 7 [IU] via SUBCUTANEOUS

## 2017-12-24 MED ORDER — ONDANSETRON HCL 4 MG/2ML IJ SOLN
INTRAMUSCULAR | Status: AC
  Filled 2017-12-24: qty 2

## 2017-12-24 MED ORDER — ROPIVACAINE HCL 5 MG/ML IJ SOLN
INTRAMUSCULAR | Status: AC
  Filled 2017-12-24: qty 30

## 2017-12-24 MED ORDER — ROPIVACAINE HCL 5 MG/ML IJ SOLN
INTRAMUSCULAR | Status: DC | PRN
  Administered 2017-12-24: 20 mL via PERINEURAL
  Administered 2017-12-24: 10 mL via PERINEURAL

## 2017-12-24 MED ORDER — LIDOCAINE HCL (PF) 2 % IJ SOLN
INTRAMUSCULAR | Status: DC | PRN
  Administered 2017-12-24: 1 mL via INTRADERMAL

## 2017-12-24 MED ORDER — SEVOFLURANE IN SOLN
RESPIRATORY_TRACT | Status: AC
  Filled 2017-12-24: qty 250

## 2017-12-24 MED ORDER — BUPIVACAINE HCL (PF) 0.5 % IJ SOLN
INTRAMUSCULAR | Status: AC
Start: 2017-12-24 — End: ?
  Filled 2017-12-24: qty 30

## 2017-12-24 MED ORDER — OXYCODONE HCL 5 MG PO TABS: 5.0000 mg | ORAL_TABLET | Freq: Once | ORAL | Status: DC | PRN

## 2017-12-24 MED ORDER — PROPOFOL 10 MG/ML IV BOLUS
INTRAVENOUS | Status: DC | PRN
  Administered 2017-12-24: 150 mg via INTRAVENOUS
  Administered 2017-12-24: 20 mg via INTRAVENOUS

## 2017-12-24 MED ORDER — FENTANYL CITRATE (PF) 100 MCG/2ML IJ SOLN
25.0000 ug | INTRAMUSCULAR | Status: DC | PRN
  Administered 2017-12-24 (×2): 50 ug via INTRAVENOUS

## 2017-12-24 MED ORDER — BUPIVACAINE HCL 0.5 % IJ SOLN
INTRAMUSCULAR | Status: DC | PRN
  Administered 2017-12-24: 10 mL

## 2017-12-24 MED ORDER — FENTANYL CITRATE (PF) 100 MCG/2ML IJ SOLN
INTRAMUSCULAR | Status: DC | PRN
  Administered 2017-12-24: 50 ug via INTRAVENOUS

## 2017-12-24 MED ORDER — MIDAZOLAM HCL 2 MG/2ML IJ SOLN
INTRAMUSCULAR | Status: DC | PRN
  Administered 2017-12-24: 2 mg via INTRAVENOUS

## 2017-12-24 MED ORDER — PHENYLEPHRINE HCL 10 MG/ML IJ SOLN
INTRAMUSCULAR | Status: AC
  Filled 2017-12-24: qty 1

## 2017-12-24 MED ORDER — MIDAZOLAM HCL 2 MG/2ML IJ SOLN
INTRAMUSCULAR | Status: AC
  Filled 2017-12-24: qty 2

## 2017-12-24 MED ORDER — PROPOFOL 10 MG/ML IV BOLUS
INTRAVENOUS | Status: AC
  Filled 2017-12-24: qty 20

## 2017-12-24 MED ORDER — ONDANSETRON HCL 4 MG/2ML IJ SOLN
INTRAMUSCULAR | Status: AC
Start: 2017-12-24 — End: ?
  Filled 2017-12-24: qty 2

## 2017-12-24 MED ORDER — SUCCINYLCHOLINE CHLORIDE 20 MG/ML IJ SOLN
INTRAMUSCULAR | Status: AC
Start: 2017-12-24 — End: ?
  Filled 2017-12-24: qty 1

## 2017-12-24 SURGICAL SUPPLY — 67 items
"PENCIL ELECTRO HAND CTR " (MISCELLANEOUS) ×1 IMPLANT
BANDAGE ACE 4X5 VEL STRL LF (GAUZE/BANDAGES/DRESSINGS) ×3 IMPLANT
BANDAGE ELASTIC 4 LF NS (GAUZE/BANDAGES/DRESSINGS) ×3 IMPLANT
BLADE MED AGGRESSIVE (BLADE) ×1 IMPLANT
BLADE MINI RND TIP GREEN BEAV (BLADE) ×2 IMPLANT
BLADE OSC/SAGITTAL MD 5.5X18 (BLADE) ×3 IMPLANT
BLADE OSCILLATING/SAGITTAL (BLADE)
BLADE SURG 15 STRL LF DISP TIS (BLADE) ×2 IMPLANT
BLADE SURG 15 STRL SS (BLADE) ×8
BLADE SURG MINI STRL (BLADE) ×3 IMPLANT
BLADE SW THK.38XMED LNG THN (BLADE) IMPLANT
BNDG CONFORM 3 STRL LF (GAUZE/BANDAGES/DRESSINGS) ×2 IMPLANT
BNDG ESMARK 4X12 TAN STRL LF (GAUZE/BANDAGES/DRESSINGS) ×3 IMPLANT
BNDG GAUZE 4.5X4.1 6PLY STRL (MISCELLANEOUS) ×3 IMPLANT
BNDG STRETCH 4X75 STRL LF (GAUZE/BANDAGES/DRESSINGS) ×2 IMPLANT
CANISTER SUCT 1200ML W/VALVE (MISCELLANEOUS) ×3 IMPLANT
CLOSURE WOUND 1/4X4 (GAUZE/BANDAGES/DRESSINGS) ×1
CUFF TOURN 18 STER (MISCELLANEOUS) ×3 IMPLANT
CUFF TOURN DUAL PL 12 NO SLV (MISCELLANEOUS) ×5 IMPLANT
DRAPE FLUOR MINI C-ARM 54X84 (DRAPES) ×3 IMPLANT
DRSG VAC ATS MED SENSATRAC (GAUZE/BANDAGES/DRESSINGS) ×2 IMPLANT
DURAPREP 26ML APPLICATOR (WOUND CARE) ×3 IMPLANT
ELECT REM PT RETURN 9FT ADLT (ELECTROSURGICAL) ×3
ELECTRODE REM PT RTRN 9FT ADLT (ELECTROSURGICAL) ×1 IMPLANT
GAUZE PETRO XEROFOAM 1X8 (MISCELLANEOUS) ×3 IMPLANT
GAUZE SPONGE 4X4 12PLY STRL (GAUZE/BANDAGES/DRESSINGS) ×3 IMPLANT
GAUZE STRETCH 2X75IN STRL (MISCELLANEOUS) ×3 IMPLANT
GAUZE XEROFORM 4X4 STRL (GAUZE/BANDAGES/DRESSINGS) ×2 IMPLANT
GLOVE BIO SURGEON STRL SZ7.5 (GLOVE) ×3 IMPLANT
GLOVE INDICATOR 8.0 STRL GRN (GLOVE) ×3 IMPLANT
GOWN STRL REUS W/ TWL LRG LVL3 (GOWN DISPOSABLE) ×2 IMPLANT
GOWN STRL REUS W/TWL LRG LVL3 (GOWN DISPOSABLE) ×4
HANDPIECE VERSAJET DEBRIDEMENT (MISCELLANEOUS) ×3 IMPLANT
KIT TURNOVER KIT A (KITS) ×3 IMPLANT
LABEL OR SOLS (LABEL) ×3 IMPLANT
NDL FILTER BLUNT 18X1 1/2 (NEEDLE) ×1 IMPLANT
NDL HYPO 25X1 1.5 SAFETY (NEEDLE) ×3 IMPLANT
NEEDLE FILTER BLUNT 18X 1/2SAF (NEEDLE) ×2
NEEDLE FILTER BLUNT 18X1 1/2 (NEEDLE) ×1 IMPLANT
NEEDLE HYPO 25X1 1.5 SAFETY (NEEDLE) ×9 IMPLANT
NS IRRIG 500ML POUR BTL (IV SOLUTION) ×3 IMPLANT
PACK EXTREMITY ARMC (MISCELLANEOUS) ×3 IMPLANT
PAD ABD DERMACEA PRESS 5X9 (GAUZE/BANDAGES/DRESSINGS) ×4 IMPLANT
PENCIL ELECTRO HAND CTR (MISCELLANEOUS) ×3 IMPLANT
RASP SM TEAR CROSS CUT (RASP) IMPLANT
SOL .9 NS 3000ML IRR  AL (IV SOLUTION) ×2
SOL .9 NS 3000ML IRR UROMATIC (IV SOLUTION) ×1 IMPLANT
SOL PREP PVP 2OZ (MISCELLANEOUS) ×3
SOLUTION PREP PVP 2OZ (MISCELLANEOUS) ×1 IMPLANT
SPONGE LAP 18X18 5 PK (GAUZE/BANDAGES/DRESSINGS) ×2 IMPLANT
STOCKINETTE 48X4 2 PLY STRL (GAUZE/BANDAGES/DRESSINGS) ×1 IMPLANT
STOCKINETTE STRL 4IN 9604848 (GAUZE/BANDAGES/DRESSINGS) ×3 IMPLANT
STOCKINETTE STRL 6IN 960660 (GAUZE/BANDAGES/DRESSINGS) ×3 IMPLANT
STRIP CLOSURE SKIN 1/4X4 (GAUZE/BANDAGES/DRESSINGS) ×2 IMPLANT
SUT ETHILON 2 0 FS 18 (SUTURE) ×4 IMPLANT
SUT ETHILON 3-0 FS-10 30 BLK (SUTURE) ×3
SUT ETHILON 4-0 (SUTURE) ×2
SUT ETHILON 4-0 FS2 18XMFL BLK (SUTURE) ×1
SUT VIC AB 3-0 SH 27 (SUTURE) ×2
SUT VIC AB 3-0 SH 27X BRD (SUTURE) ×1 IMPLANT
SUT VIC AB 4-0 FS2 27 (SUTURE) ×3 IMPLANT
SUTURE EHLN 3-0 FS-10 30 BLK (SUTURE) ×1 IMPLANT
SUTURE ETHLN 4-0 FS2 18XMF BLK (SUTURE) ×1 IMPLANT
SWAB DUAL CULTURE TRANS RED ST (MISCELLANEOUS) ×3 IMPLANT
SYR 10ML LL (SYRINGE) ×6 IMPLANT
SYR 3ML LL SCALE MARK (SYRINGE) ×3 IMPLANT
WND VAC CANISTER 500ML (MISCELLANEOUS) ×2 IMPLANT

## 2017-12-24 NOTE — Progress Notes (Signed)
Patient ID: [place] [name], male   DOB: June 06, 1986, 32 y.o.   MRN: [id number]  [facility] PROGRESS NOTE  Sh[place] [name] RJJ:[id number] DOB: 22-Aug-1985 DOA: 12/18/2017 PCP: Patient, No Pcp Per  HPI/Subjective: Patient with some pain in his right foot.  Had some nausea vomiting and diarrhea last night.  Objective: Vitals:   12/24/17 1300 12/24/17 1348  BP: 121/82 (!) 149/90  Pulse: 86 89  Resp: 20 20  Temp:  97.8 F (36.6 C)  SpO2:  100%    Filed Weights   12/18/17 1215  Weight: 52.2 kg (115 lb)    ROS: Review of Systems  Constitutional: Negative for chills and fever.  Eyes: Negative for blurred vision.  Respiratory: Negative for cough and shortness of breath.   Cardiovascular: Negative for chest pain.  Gastrointestinal: Positive for abdominal pain, diarrhea, nausea and vomiting. Negative for constipation.  Genitourinary: Negative for dysuria.  Musculoskeletal: Positive for joint pain.  Neurological: Negative for dizziness and headaches.   Exam: Physical Exam  Constitutional: He is oriented to person, place, and time.  HENT:  Nose: No mucosal edema.  Mouth/Throat: No oropharyngeal exudate or posterior oropharyngeal edema.  Eyes: Pupils are equal, round, and reactive to light. Conjunctivae, EOM and lids are normal.  Neck: No JVD present. Carotid bruit is not present. No edema present. No thyroid mass and no thyromegaly present.  Cardiovascular: S1 normal and S2 normal. Exam reveals no gallop.  No murmur heard. Pulses:      Dorsalis pedis pulses are 2+ on the right side, and 2+ on the left side.  Respiratory: No respiratory distress. He has no wheezes. He has no rhonchi. He has no rales.  GI: Soft. Bowel sounds are normal. There is no tenderness.  Musculoskeletal:       Right ankle: He exhibits no swelling.       Left ankle: He exhibits no swelling.  Lymphadenopathy:    He has no cervical adenopathy.  Neurological: He is alert and oriented to person, place,  and time. No cranial nerve deficit.  Skin: Skin is warm. Nails show no clubbing.  Right foot covered with bandage.  Psychiatric: He has a normal mood and affect.      Data Reviewed: Basic Metabolic Panel: Recent Labs  Lab 12/18/17 1221 12/18/17 1608 12/19/17 0341 12/20/17 0338  NA 130* 132* 134* 132*  K 4.2 3.7 3.9 4.1  CL 92* 99* 103 102  CO2 27 24 24 24   GLUCOSE 129* 85 162* 194*  BUN 30* 30* 28* 19  CREATININE 1.39* 1.21 1.30* 1.18  CALCIUM 9.2 8.2* 7.9* 8.2*   Liver Function Tests: Recent Labs  Lab 12/18/17 1221 12/18/17 1608  AST 16 19  ALT 13* 12*  ALKPHOS 84 67  BILITOT 0.6 0.5  PROT 7.6 6.4*  ALBUMIN 3.1* 2.6*   No results for input(s): LIPASE, AMYLASE in the last 168 hours. No results for input(s): AMMONIA in the last 168 hours. CBC: Recent Labs  Lab 12/18/17 1221 12/18/17 1608 12/19/17 0341 12/20/17 0338 12/21/17 0550 12/22/17 0400 12/24/17 0351  WBC 23.0* 19.6* 17.1* 18.2* 14.2* 10.8* 10.9*  NEUTROABS 19.7* 16.0*  --   --  10.9* 7.6*  --   HGB 11.5* 8.9* 8.2* 7.9* 7.8* 7.6* 8.6*  HCT 32.7* 26.4* 24.1* 23.4* 23.3* 22.5* 25.8*  MCV 81.8 83.5 83.0 83.3 83.3 83.1 83.9  PLT 298 224 220 252 304 347 477*   Cardiac Enzymes: No results for input(s): CKTOTAL, CKMB, CKMBINDEX, TROPONINI in the  last 168 hours. BNP (last 3 results) Recent Labs    07/20/17 0928  BNP 87.0    ProBNP (last 3 results) No results for input(s): PROBNP in the last 8760 hours.  CBG: Recent Labs  Lab 12/23/17 1804 12/23/17 2047 12/24/17 0804 12/24/17 1053 12/24/17 1239  GLUCAP 141* 296* 360* 314* 281*    Recent Results (from the past 240 hour(s))  Blood culture (routine x 2)     Status: None   Collection Time: 12/18/17 12:55 PM  Result Value Ref Range Status   Specimen Description BLOOD R AC  Final   Special Requests   Final    BOTTLES DRAWN AEROBIC AND ANAEROBIC Blood Culture adequate volume   Culture   Final    NO GROWTH 5 DAYS Performed at [facility], [address] [place], [place] [address]    Report Status 12/23/2017 FINAL  Final  Blood culture (routine x 2)     Status: None   Collection Time: 12/18/17  1:00 PM  Result Value Ref Range Status   Specimen Description BLOOD L FOREARM  Final   Special Requests   Final    BOTTLES DRAWN AEROBIC AND ANAEROBIC Blood Culture adequate volume   Culture   Final    NO GROWTH 5 DAYS Performed at [facility], [address], [place], [place] [address]    Report Status 12/23/2017 FINAL  Final  Culture, blood (x 2)     Status: None   Collection Time: 12/18/17  3:43 PM  Result Value Ref Range Status   Specimen Description BLOOD R HAND  Final   Special Requests   Final    BOTTLES DRAWN AEROBIC AND ANAEROBIC Blood Culture adequate volume   Culture   Final    NO GROWTH 5 DAYS Performed at [facility], [address], [place], [place] [address]    Report Status 12/23/2017 FINAL  Final  Culture, blood (x 2)     Status: None   Collection Time: 12/18/17  3:43 PM  Result Value Ref Range Status   Specimen Description BLOOD R HAND  Final   Special Requests   Final    BOTTLES DRAWN AEROBIC AND ANAEROBIC Blood Culture adequate volume   Culture   Final    NO GROWTH 5 DAYS Performed at [facility], [address] [place], [place] [address]    Report Status 12/23/2017 FINAL  Final  Aerobic/Anaerobic Culture (surgical/deep wound)     Status: None   Collection Time: 12/19/17  4:59 AM  Result Value Ref Range Status   Specimen Description   Final    WOUND RIGHT FOOT Performed at [facility], [address], [place], [place] [address]    Special Requests   Final    NONE Performed at [facility], [address] [place], [place] [address]    Gram Stain   Final    NO WBC SEEN RARE GRAM POSITIVE COCCI IN PAIRS RARE GRAM POSITIVE RODS Performed at [facility], [place] [address] [place],  [address]    Culture   Final     FEW STAPHYLOCOCCUS AUREUS WITHIN MIXED ORGANISMS MIXED ANAEROBIC FLORA PRESENT.  CALL LAB IF FURTHER IID REQUIRED.    Report Status 12/23/2017 FINAL  Final   Organism ID, Bacteria STAPHYLOCOCCUS AUREUS  Final      Susceptibility   Staphylococcus aureus - MIC*    CIPROFLOXACIN <=0.5 SENSITIVE Sensitive     ERYTHROMYCIN 0.5 SENSITIVE  Sensitive     GENTAMICIN <=0.5 SENSITIVE Sensitive     OXACILLIN <=0.25 SENSITIVE Sensitive     TETRACYCLINE <=1 SENSITIVE Sensitive     VANCOMYCIN 1 SENSITIVE Sensitive     TRIMETH/SULFA <=10 SENSITIVE Sensitive     CLINDAMYCIN <=0.25 SENSITIVE Sensitive     RIFAMPIN <=0.5 SENSITIVE Sensitive     Inducible Clindamycin NEGATIVE Sensitive     * FEW STAPHYLOCOCCUS AUREUS  Aerobic/Anaerobic Culture (surgical/deep wound)     Status: None (Preliminary result)   Collection Time: 12/20/17  1:21 PM  Result Value Ref Range Status   Specimen Description   Final    WOUND RIGHT FOOT ABSCESS Performed at [facility], [address] [place], [place] [address]    Special Requests   Final    NONE Performed at [facility], [place], [place] [address]    Gram Stain   Final    RARE WBC PRESENT,BOTH PMN AND MONONUCLEAR FEW GRAM POSITIVE COCCI Performed at [facility], [place] [address] [place], [place] [address]    Culture   Final    MODERATE STREPTOCOCCUS AGALACTIAE TESTING AGAINST S. AGALACTIAE NOT ROUTINELY PERFORMED DUE TO PREDICTABILITY OF AMP/PEN/VAN SUSCEPTIBILITY. RARE STAPHYLOCOCCUS AUREUS NO ANAEROBES ISOLATED; CULTURE IN PROGRESS FOR 5 DAYS    Report Status PENDING  Incomplete   Organism ID, Bacteria STAPHYLOCOCCUS AUREUS  Final      Susceptibility   Staphylococcus aureus - MIC*    CIPROFLOXACIN <=0.5 SENSITIVE Sensitive     ERYTHROMYCIN <=0.25 SENSITIVE Sensitive     GENTAMICIN <=0.5 SENSITIVE Sensitive     OXACILLIN <=0.25 SENSITIVE Sensitive     TETRACYCLINE <=1 SENSITIVE Sensitive     VANCOMYCIN 1  SENSITIVE Sensitive     TRIMETH/SULFA <=10 SENSITIVE Sensitive     CLINDAMYCIN <=0.25 SENSITIVE Sensitive     RIFAMPIN <=0.5 SENSITIVE Sensitive     Inducible Clindamycin NEGATIVE Sensitive     * RARE STAPHYLOCOCCUS AUREUS     Studies: No results found.  Scheduled Meds: . docusate sodium  100 mg Oral BID  . fentaNYL      . FLUoxetine  20 mg Oral Daily  . insulin aspart  0-5 Units Subcutaneous QHS  . insulin aspart  0-9 Units Subcutaneous TID WC  . insulin aspart  6 Units Subcutaneous TID WC  . insulin glargine  24 Units Subcutaneous Daily  . metoCLOPramide  10 mg Oral TID AC  . ondansetron      . sodium chloride flush  3 mL Intravenous Q12H   Continuous Infusions: . sodium chloride 75 mL/hr at 12/24/17 1403  . vancomycin Stopped (12/24/17 0730)    Assessment/Plan:  1. Right foot with necrotic areas and poor circulation.  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  Wound cultures growing MSSA and group B strep.  Patient on vancomycin. 2. Uncontrolled diabetes with hyperglycemia on Lantus and sliding scale. 3. Depression anxiety on Prozac and Xanax 4. History of irritable bowel syndrome  Code Status:     Code Status Orders  (From admission, onward)        Start     Ordered   12/20/17 1542  Full code  Continuous     12/20/17 1541    Code Status History    Date Active Date Inactive Code Status Order ID Comments User Context   11/10/2017 0049 11/11/2017 1632 Full Code [id number]  Amelia Jo, MD Inpatient   07/13/2017 2112 07/15/2017 1730 Full  Code [id number]  Jules Husbands, MD ED     Family Communication: family at the bedside  Disposition Plan: To be determined  Consultants:  Podiatry  Vascular surgery  Antibiotics:  Vancomycin  Time spent: 27 minutes  [place]

## 2017-12-24 NOTE — Progress Notes (Signed)
Patient ID: [place] [name], male   DOB: 1985-10-27, 32 y.o.   MRN: [id number] Subjective: Patient was seen in his room after the nurse noted that there was some significant bleeding from the right foot when he stood up to go to the bathroom.  Was not putting any weight on the foot.  Objective: Some bleeding was noted from the distal aspect of the wound VAC and suction had been lost.  Upon removal there was some significant active bleeding from the lateral wound on the right foot.  The wound was packed with Surgicel and compression held and able to come under control.  A bulky sterile gauze bandage was then applied to the right foot.  This was followed by an Ace wrap.  H&H was found to be stable.  Follow-up about an hour later revealed no evidence of bleeding through the bandage at that point.  Assessment: Status post fourth and fifth ray resection with some postoperative bleeding.  Plan: As mentioned above the bleeding was able to come under control.  Follow-up with the patient tomorrow.  Instructed nursing on reinforcement if any strikethrough overnight.

## 2017-12-24 NOTE — Progress Notes (Signed)
Dr. Amie Critchley in to do block on patient.

## 2017-12-24 NOTE — Transfer of Care (Signed)
Immediate Anesthesia Transfer of Care Note  Patient: Shawn [name]  Procedure(s) Performed: Procedure(s): IRRIGATION AND DEBRIDEMENT FOOT (Right) AMPUTATION RAY (Right) APPLICATION OF WOUND VAC (Right)  Patient Location: PACU  Anesthesia Type:General  Level of Consciousness: sedated  Airway & Oxygen Therapy: Patient Spontanous Breathing and Patient connected to face mask oxygen  Post-op Assessment: Report given to RN and Post -op Vital signs reviewed and stable  Post vital signs: Reviewed and stable  Last Vitals:  Vitals:   12/24/17 1048 12/24/17 1103  BP: 102/71 112/72  Pulse: 69 73  Resp: 13 14  Temp: 36.8 C   SpO2: 563% 875%    Complications: No apparent anesthesia complications

## 2017-12-24 NOTE — Anesthesia Preprocedure Evaluation (Addendum)
Anesthesia Evaluation  Patient identified by MRN, date of birth, ID band Patient awake    Reviewed: Allergy & Precautions, H&P , NPO status , Patient's Chart, lab work & pertinent test results  History of Anesthesia Complications Negative for: history of anesthetic complications  Airway Mallampati: II  TM Distance: >3 FB Neck ROM: full    Dental  (+) Chipped, Poor Dentition, Missing   Pulmonary neg pulmonary ROS, neg shortness of breath,           Cardiovascular Exercise Tolerance: Good (-) angina(-) Past MI and (-) DOE negative cardio ROS       Neuro/Psych PSYCHIATRIC DISORDERS Depression negative neurological ROS     GI/Hepatic Neg liver ROS, GERD  Medicated and Controlled,  Endo/Other  diabetes, Poorly Controlled, Type 2  Renal/GU      Musculoskeletal   Abdominal   Peds  Hematology negative hematology ROS (+)   Anesthesia Other Findings Past Medical History: No date: Diabetes mellitus without complication ([facility]) No date: GERD (gastroesophageal reflux disease) No date: IBS (irritable bowel syndrome)  Past Surgical History: 12/20/2017: IRRIGATION AND DEBRIDEMENT FOOT; Right     Comment:  Procedure: IRRIGATION AND DEBRIDEMENT FOOT;  Surgeon:               Cline, Todd, DPM;  Location: [facility] ORS;  Service:               Podiatry;  Laterality: Right;  BMI    Body Mass Index:  18.56 kg/m      Reproductive/Obstetrics negative OB ROS                             Anesthesia Physical  Anesthesia Plan  ASA: III  Anesthesia Plan: General   Post-op Pain Management: GA combined w/ Regional for post-op pain   Induction: Intravenous  PONV Risk Score and Plan: Ondansetron, Dexamethasone and Midazolam  Airway Management Planned: LMA  Additional Equipment:   Intra-op Plan:   Post-operative Plan: Extubation in OR  Informed Consent: I have reviewed the patients History and Physical,  chart, labs and discussed the procedure including the risks, benefits and alternatives for the proposed anesthesia with the patient or authorized representative who has indicated his/her understanding and acceptance.   Dental Advisory Given  Plan Discussed with: Anesthesiologist, CRNA and Surgeon  Anesthesia Plan Comments: (Patient consented for risks of anesthesia including but not limited to:  - adverse reactions to medications - damage to teeth, lips or other oral mucosa - sore throat or hoarseness - Damage to heart, brain, lungs or loss of life  Patient voiced understanding.)        Anesthesia Quick Evaluation  

## 2017-12-24 NOTE — Progress Notes (Signed)
Dr. Amie Critchley to speak with patient in reference to a block.

## 2017-12-24 NOTE — Anesthesia Post-op Follow-up Note (Signed)
Anesthesia QCDR form completed.        

## 2017-12-24 NOTE — Progress Notes (Signed)
PT Cancellation Note  Patient Details Name: [place] [name] MRN: [id number] DOB: 05/05/1986   Cancelled Treatment:    Reason Eval/Treat Not Completed: Patient at procedure or test/unavailable(Per chart review, patient noted off unit this date for additional procedure (amputation of R 4-5th rays); continue upon transfer orders noted. Will re-initiate therapy consult next date as medically appropriate.)  Jaythen Hamme H. Owens Shark, PT, DPT, NCS 12/24/17, 11:19 AM [phone]

## 2017-12-24 NOTE — Anesthesia Procedure Notes (Signed)
Procedure Name: LMA Insertion Date/Time: 12/24/2017 9:27 AM Performed by: Doreen Salvage, CRNA Pre-anesthesia Checklist: Patient identified, Patient being monitored, Timeout performed, Emergency Drugs available and Suction available Patient Re-evaluated:Patient Re-evaluated prior to induction Oxygen Delivery Method: Circle system utilized Preoxygenation: Pre-oxygenation with 100% oxygen Induction Type: IV induction Ventilation: Mask ventilation without difficulty LMA: LMA inserted LMA Size: 4.0 Tube type: Oral Number of attempts: 1 Placement Confirmation: positive ETCO2 and breath sounds checked- equal and bilateral Tube secured with: Tape Dental Injury: Teeth and Oropharynx as per pre-operative assessment

## 2017-12-24 NOTE — Op Note (Signed)
Date of operation: 12/24/2017.  Surgeon: [name] D.P.M.  Preoperative diagnosis: Cellulitis with abscess and necrosis and gangrenous changes right forefoot.  Postoperative diagnosis: Same.  Procedures: Amputation right fourth and fifth toes with partial ray resections.  Anesthesia: LMA.  Hemostasis: Pneumatic tourniquet right ankle 250 mmHg.  Estimated blood loss: 10 to 15 cc.  Implants: None.  Pathology: Right fourth and fifth rays.  Injectables: 10 cc 0.5% bupivacaine plain.  Complications: None apparent.  Operative indications: This is a 32 year old male with uncontrolled diabetes and abscess on his right foot.  Underwent I&D earlier this week and experienced progressive necrosis around all of the surgical sites.  Vascular consult was obtained and was noted to have significant absence of blood flow around the lateral aspect of the right forefoot.  Decision was made after conferring with vascular for fourth and fifth ray amputations with application of a wound VAC.  Operative procedure: Patient was taken the operating room and placed on the table in the supine position.  Following satisfactory LMA anesthesia a pneumatic tourniquet was applied at the level of the right lower leg.  The foot was prepped and draped in usual sterile fashion.  Attention was then directed to the distal aspect of the right lateral foot where all of the necrotic tissue was outlined with a marker.  An incision was made from the lateral aspect of the foot over the fifth metatarsal dorsally excising the necrosis and ending in the third interspace at the base of the fourth toe medially.  A similar incision was then made from the third interspace plantarly excising the plantar necrosis and joining the previous lateral incision.  Incision was carried sharply down to the level of bone and joint and dissected away from the soft tissues.  Along the medial aspect of the wound and incision there was noted to be good  bleeding and at this point decision was made to exsanguinate the foot and inflate the tourniquet.  Using a sagittal saw the fourth and fifth metatarsals were incised and the fourth and fifth rays and toes were removed in toto.  Previous plantar incision was opened back up as there was some necrosis around this as well.  Incision areas were incised using scissors to remove devitalized tissue and all of the wound edges were debrided with a versa jet on a setting of 3 and then thoroughly irrigated with a versa jet on a setting of 2.  The lateral incisions as well as the plantar incisions were partially reapproximated as best as possible using 2-0 nylon horizontal mattress sutures.  10 cc of 0.5% bupivacaine plain was then placed in the right foot.  A wound VAC was then applied to the right lower extremity and there was noted to be a good seal.  Tourniquet was inflated and the wound VAC on the right foot was covered with Kerlix and an Ace wrap.  Patient was awakened and transported to the PACU with vital signs stable and in good condition.  I did speak with anesthesia and they have agreed to put in a popliteal and/or femoral nerve block postoperatively to help with analgesia as well.

## 2017-12-24 NOTE — Anesthesia Procedure Notes (Signed)
Anesthesia Regional Block: Popliteal block   Pre-Anesthetic Checklist: ,, timeout performed, Correct Patient, Correct Site, Correct Laterality, Correct Procedure, Correct Position, site marked, Risks and benefits discussed,  Surgical consent,  Pre-op evaluation,  At surgeon's request and post-op pain management  Laterality: Lower and Right  Prep: chloraprep       Needles:  Injection technique: Single-shot  Needle Type: Echogenic Needle     Needle Length: 9cm  Needle Gauge: 21     Additional Needles:   Procedures:,,,, ultrasound used (permanent image in chart),,,,  Narrative:  Start time: 12/24/2017 11:52 AM End time: 12/24/2017 11:56 AM Injection made incrementally with aspirations every 5 mL.  Performed by: Personally  Anesthesiologist: Piscitello, Precious Haws, MD  Additional Notes: Patient reports baseline numbness in right foot before the procedure  Functioning IV was confirmed and monitors were applied.  A echogenic needle was used. Sterile prep,hand hygiene and sterile gloves were used. Minimal sedation used for procedure.   No paresthesia endorsed by patient during the procedure.  Negative aspiration and negative test dose prior to incremental administration of local anesthetic. The patient tolerated the procedure well with no immediate complications.

## 2017-12-24 NOTE — Progress Notes (Signed)
Dr. Amie Critchley aware patient is awake,  Patient when Asked about pain number he states 9/10,  Closed His eyes and dozed back off.

## 2017-12-24 NOTE — Progress Notes (Signed)
15 minute call to floor. 

## 2017-12-24 NOTE — Anesthesia Postprocedure Evaluation (Signed)
Anesthesia Post Note  Patient: Shawn [name]  Procedure(s) Performed: IRRIGATION AND DEBRIDEMENT FOOT (Right Foot) AMPUTATION RAY (Right Foot) APPLICATION OF WOUND VAC (Right Foot)  Patient location during evaluation: PACU Anesthesia Type: General Level of consciousness: awake and alert Pain management: pain level controlled Vital Signs Assessment: post-procedure vital signs reviewed and stable Respiratory status: spontaneous breathing, nonlabored ventilation, respiratory function stable and patient connected to nasal cannula oxygen Cardiovascular status: blood pressure returned to baseline and stable Postop Assessment: no apparent nausea or vomiting Anesthetic complications: no     Last Vitals:  Vitals:   12/24/17 1150 12/24/17 1221  BP: 128/84 124/80  Pulse: 90 90  Resp: 14 20  Temp:  36.8 C  SpO2: 100% 98%    Last Pain:  Vitals:   12/24/17 1221  TempSrc: Oral  PainSc:                  Precious Haws Piscitello

## 2017-12-24 NOTE — Progress Notes (Addendum)
Pt shortly after initial check; pt. stood at beside to void with NWB on right foot with onset of bleeding bright red blood which was dripping from the right outer distal dsg corner and pooling in the floor. Pt returned to bed immediately with leg flat with active bleeding noted and rapid filling of wound vac of blood. Dr. Cleda Mccreedy called and in to see pt. Meds updated. VS's monitored and remained stable.  Multiple dsgs and packing were done with large amount blood loss with MD controlling bleeding by applying pressure and appropriate medical items/supplies. Family in and updated. Dr. Cleda Mccreedy remained at bedside until bleeding resolved with dsg reapplied; no wound vac now; ice pack applied over right foot by MD with right foot and leg elevated on 2 pillows. Nausea med, pain med administered with adequate relief. Pt remained pink, warm/dry through out. Post HGB and hct 8.0. Pt ate meal and tolerated. Resting quietly currently/napping. No bleeding with dsg dry/intact on check every 15- 30 minutes.

## 2017-12-24 NOTE — Progress Notes (Signed)
No further bleeding with dsg on right foot dry and intact.

## 2017-12-24 NOTE — Progress Notes (Signed)
Pt returned to room at 1220 from PACU in stable condition with VSS's. Dsg dry/intac with wound vac in place.

## 2017-12-24 NOTE — Interval H&P Note (Signed)
History and Physical Interval Note:  12/24/2017 8:46 AM  Shawn [name]  has presented today for surgery, with the diagnosis of N/A  The various methods of treatment have been discussed with the patient and family. After consideration of risks, benefits and other options for treatment, the patient has consented to  Procedure(s): IRRIGATION AND DEBRIDEMENT FOOT (Right) AMPUTATION RAY (Right) APPLICATION OF WOUND VAC (Right) as a surgical intervention .  The patient's history has been reviewed, patient examined, no change in status, stable for surgery.  I have reviewed the patient's chart and labs.  Questions were answered to the patient's satisfaction.     [name]

## 2017-12-24 NOTE — Progress Notes (Signed)
Inpatient Diabetes Program Recommendations  AACE/ADA: New Consensus Statement on Inpatient Glycemic Control (2019)  Target Ranges:  Prepandial:   less than 140 mg/dL      Peak postprandial:   less than 180 mg/dL (1-2 hours)      Critically ill patients:  140 - 180 mg/dL  Results for Shawn [name], Shawn [name] (MRN [id number]) as of 12/24/2017 08:15  Ref. Range 12/23/2017 07:32 12/23/2017 11:41 12/23/2017 14:06 12/23/2017 15:33 12/23/2017 18:04 12/23/2017 20:47 12/24/2017 08:04  Glucose-Capillary Latest Ref Range: 65 - 99 mg/dL 175 (H) 172 (H) 133 (H) 122 (H) 141 (H) 296 (H) 360 (H)    Review of Glycemic Control  Diabetes history: DM1 Outpatient Diabetes medications: Lantus 30 units daily Current orders for Inpatient glycemic control: Lantus 24 units daily, Novolog 6 units TID with meals, Novolog 0-9 units TID with meals, Novolog 0-5 units QHS  Inpatient Diabetes Program Recommendations: Correction (SSI): If patient will remain NPO, please consider changing frequency of CBGs and Novolog to 0-9 units Q4H.  NOTE: In reviewing chart, noted patient received Solumedrol 125 mg x 1 on 12/23/17 at 14:29 which is likely cause of fasting glucose of 360 mg/dl today. Do not recommend increasing any insulin dosages at this time since no other steroids are ordered. However, if patient will remain NPO for a long duration today, please consider changing frequency of of CBGs and Novolog correction to Q4H.  Thanks, [name], RN, MSN, CDE Diabetes Coordinator Inpatient Diabetes Program [phone] (Team Pager from 8am to 5pm)

## 2017-12-24 NOTE — Progress Notes (Signed)
PT Cancellation Note  Patient Details Name: Shawn [name] MRN: [id number] DOB: [birth year]   Cancelled Treatment:    Reason Eval/Treat Not Completed: Other (comment)   Pt offered session this am.  Pt stated he is walking to/from bathroom with nursing staff. Reports no concerns.  Will continue as appropriate.     Chesley Noon 12/24/2017, 8:35 AM

## 2017-12-25 ENCOUNTER — Encounter: Payer: Self-pay | Admitting: Podiatry

## 2017-12-25 LAB — CBC WITH DIFFERENTIAL/PLATELET
Basophils Absolute: 0.1 10*3/uL (ref 0–0.1)
Basophils Relative: 1 %
EOS ABS: 0.2 10*3/uL (ref 0–0.7)
Eosinophils Relative: 1 %
HCT: 18.9 % — ABNORMAL LOW (ref 40.0–52.0)
Hemoglobin: 6.4 g/dL — ABNORMAL LOW (ref 13.0–18.0)
LYMPHS ABS: 1.6 10*3/uL (ref 1.0–3.6)
Lymphocytes Relative: 13 %
MCH: 28.1 pg (ref 26.0–34.0)
MCHC: 33.8 g/dL (ref 32.0–36.0)
MCV: 83 fL (ref 80.0–100.0)
Monocytes Absolute: 1.3 10*3/uL — ABNORMAL HIGH (ref 0.2–1.0)
Monocytes Relative: 11 %
Neutro Abs: 8.9 10*3/uL — ABNORMAL HIGH (ref 1.4–6.5)
Neutrophils Relative %: 74 %
Platelets: 482 10*3/uL — ABNORMAL HIGH (ref 150–440)
RBC: 2.28 MIL/uL — ABNORMAL LOW (ref 4.40–5.90)
RDW: 13.8 % (ref 11.5–14.5)
WBC: 12.1 10*3/uL — ABNORMAL HIGH (ref 3.8–10.6)

## 2017-12-25 LAB — AEROBIC/ANAEROBIC CULTURE W GRAM STAIN (SURGICAL/DEEP WOUND)

## 2017-12-25 LAB — AEROBIC/ANAEROBIC CULTURE (SURGICAL/DEEP WOUND)

## 2017-12-25 LAB — GLUCOSE, POCT (MANUAL RESULT ENTRY): POC Glucose: 291 mg/dl — AB (ref 70–99)

## 2017-12-25 LAB — ABO/RH: ABO/RH(D): A NEG

## 2017-12-25 LAB — GLUCOSE, CAPILLARY
GLUCOSE-CAPILLARY: 206 mg/dL — AB (ref 65–99)
Glucose-Capillary: 287 mg/dL — ABNORMAL HIGH (ref 65–99)
Glucose-Capillary: 139 mg/dL — ABNORMAL HIGH (ref 65–99)

## 2017-12-25 LAB — PREPARE RBC (CROSSMATCH)

## 2017-12-25 MED ORDER — FERROUS SULFATE 325 (65 FE) MG PO TABS
325.0000 mg | ORAL_TABLET | Freq: Three times a day (TID) | ORAL | Status: DC
  Administered 2017-12-25 – 2017-12-26 (×5): 325 mg via ORAL
  Filled 2017-12-25 (×5): qty 1

## 2017-12-25 MED ORDER — SODIUM CHLORIDE 0.9% IV SOLUTION
Freq: Once | INTRAVENOUS | Status: AC
  Administered 2017-12-25: 10:00:00 via INTRAVENOUS

## 2017-12-25 MED ORDER — INSULIN GLARGINE 100 UNIT/ML ~~LOC~~ SOLN
30.0000 [IU] | Freq: Every day | SUBCUTANEOUS | Status: DC
  Administered 2017-12-25 – 2017-12-26 (×2): 30 [IU] via SUBCUTANEOUS
  Filled 2017-12-25 (×2): qty 0.3

## 2017-12-25 MED ORDER — ONDANSETRON HCL 4 MG PO TABS
4.0000 mg | ORAL_TABLET | Freq: Three times a day (TID) | ORAL | Status: DC | PRN
  Administered 2017-12-25: 12:00:00 4 mg via ORAL
  Filled 2017-12-25: qty 1

## 2017-12-25 NOTE — Clinical Social Work Note (Signed)
CSW visited the patient at bedside to discuss the process for applying for disability and Medicaid. The CSW provided an adult disability packet from the [place] to assist the patient with preparing for collecting materials needed for the application and preparing for the disability interview. The CSW also provided information about Medicaid and the Medication Management clinic. The CSW is signing off. Please consult should the patient have additional questions or needs arise.  Santiago Bumpers, MSW, Latanya Presser  [phone]

## 2017-12-25 NOTE — Progress Notes (Signed)
HGB down to 6.4 with 1 unit PRBC's transfused. Started oral iron tablet and tolerating well. Xanax x 1; percocet 2 tabs x 2, morphine 2 mg IV x 2, zofran 4 mg po x1; phenergan IV x 1. No emesis; reports persistent mild nausea. Dr. Cleda Mccreedy in and performed dsg change to rt foot- no bleeding; dsg dry/intact all day. Bedrest maintained with right foot/leg elevated on 2 pillows. Family in to visit.

## 2017-12-25 NOTE — Progress Notes (Signed)
Pharmacy Antibiotic Note  Shawn [name] is a 32 y.o. male admitted on 12/18/2017 with cellulitis.  Pharmacy has been consulted for vancomycin dosing. He was recently discharged from [facility] after being treated for DKA. His Scr is modestly elevated on this admission with a baseline near 1   6/23 Patient continues on Vancomycin 750 IV q12h. Slight bump in SCr today.  Plan: Continue current dose of vancomycin 750mg  q 12 hr Will check a Vancomycin trough and SCr prior to AM dose  Height: 5\' 6"  (167.6 cm) Weight: 115 lb (52.2 kg) IBW/kg (Calculated) : 63.8  Temp (24hrs), Avg:98.7 F (37.1 C), Min:97.7 F (36.5 C), Max:99.5 F (37.5 C)  Recent Labs  Lab 12/18/17 1547  12/18/17 1608 12/18/17 2026 12/19/17 0341 12/20/17 0338 12/21/17 0550 12/22/17 0400 12/23/17 1847 12/24/17 0351 12/24/17 1739 12/25/17 0423  WBC  --    < > 19.6*  --  17.1* 18.2* 14.2* 10.8*  --  10.9*  --  12.1*  CREATININE  --   --  1.21  --  1.30* 1.18  --   --   --   --  1.40*  --   LATICACIDVEN 1.0  --   --  0.6  --   --   --   --   --   --   --   --   VANCOTROUGH  --   --   --   --   --   --  13*  --  19  --   --   --    < > = values in this interval not displayed.    Estimated Creatinine Clearance: 57 mL/min (A) (by C-G formula based on SCr of 1.4 mg/dL (H)).    Allergies  Allergen Reactions  . Banana Hives, Nausea And Vomiting and Rash  . Keflex [Cephalexin] Anaphylaxis  . Onion Hives, Nausea And Vomiting and Rash  . Sulfa Antibiotics Anaphylaxis  . Grapeseed Extract [Nutritional Supplements] Itching  . Shellfish Allergy Hives    "ALL SEAFOOD"    Antimicrobials this admission: Vancomycin 6/16 >>   Microbiology results: 6/16 BCx: pending  Thank you for allowing pharmacy to be a part of this patient's care.  Paulina Fusi, PharmD, BCPS 12/25/2017 2:20 PM

## 2017-12-25 NOTE — Progress Notes (Signed)
1 Day Post-Op   Subjective/Chief Complaint: Patient seen.  Complains of significant pain in his right foot.   Objective: Vital signs in last 24 hours: Temp:  [97.7 F (36.5 C)-98.8 F (37.1 C)] 98.8 F (37.1 C) (06/23 0436) Pulse Rate:  [69-114] 114 (06/23 0436) Resp:  [12-20] 20 (06/23 0436) BP: (102-149)/(71-96) 141/92 (06/23 0436) SpO2:  [96 %-100 %] 96 % (06/23 0436) Last BM Date: 12/23/17  Intake/Output from previous day: 06/22 0701 - 06/23 0700 In: 1564 [P.O.:120; I.V.:1294; IV Piggyback:150] Out: 3419 [Urine:1325; Drains:250] Intake/Output this shift: Total I/O In: 1220 [I.V.:1220] Out: -   Bandage on the right foot is dry and intact.  No evidence of strikethrough in the superficial bandage.  Moderate to heavy bleeding noted on the deeper bandaging.  Improvement in erythema and edema.  Lab Results:  Recent Labs    12/24/17 0351 12/24/17 1429 12/25/17 0423  WBC 10.9*  --  12.1*  HGB 8.6* 8.0* 6.4*  HCT 25.8* 24.3* 18.9*  PLT 477*  --  482*   BMET Recent Labs    12/24/17 1739  CREATININE 1.40*   PT/INR No results for input(s): LABPROT, INR in the last 72 hours. ABG No results for input(s): PHART, HCO3 in the last 72 hours.  Invalid input(s): PCO2, PO2  Studies/Results: No results found.  Anti-infectives: Anti-infectives (From admission, onward)   Start     Dose/Rate Route Frequency Ordered Stop   12/24/17 0600  clindamycin (CLEOCIN) IVPB 300 mg     300 mg 100 mL/hr over 30 Minutes Intravenous On call to O.R. 12/23/17 1331 12/23/17 1824   12/22/17 0000  vancomycin (VANCOCIN) 1,250 mg in sodium chloride 0.9 % 250 mL IVPB  Status:  Discontinued     1,250 mg 166.7 mL/hr over 90 Minutes Intravenous Every 18 hours 12/21/17 0658 12/21/17 1005   12/21/17 1800  vancomycin (VANCOCIN) IVPB 750 mg/150 ml premix     750 mg 150 mL/hr over 60 Minutes Intravenous Every 12 hours 12/21/17 1005     12/19/17 1800  vancomycin (VANCOCIN) IVPB 1000 mg/200 mL premix   Status:  Discontinued     1,000 mg 200 mL/hr over 60 Minutes Intravenous Every 18 hours 12/19/17 0948 12/21/17 0658   12/19/17 1045  aztreonam (AZACTAM) 2 g in sodium chloride 0.9 % 100 mL IVPB  Status:  Discontinued     2 g 200 mL/hr over 30 Minutes Intravenous Every 8 hours 12/19/17 1033 12/23/17 1151   12/19/17 0000  vancomycin (VANCOCIN) IVPB 1000 mg/200 mL premix  Status:  Discontinued     1,000 mg 200 mL/hr over 60 Minutes Intravenous Every 24 hours 12/18/17 1403 12/19/17 0948   12/18/17 1445  vancomycin (VANCOCIN) IVPB 1000 mg/200 mL premix  Status:  Discontinued     1,000 mg 200 mL/hr over 60 Minutes Intravenous  Once 12/18/17 1442 12/18/17 1444   12/18/17 1300  vancomycin (VANCOCIN) IVPB 1000 mg/200 mL premix     1,000 mg 200 mL/hr over 60 Minutes Intravenous  Once 12/18/17 1248 12/18/17 1445      Assessment/Plan: s/p Procedure(s): IRRIGATION AND DEBRIDEMENT FOOT (Right) AMPUTATION RAY (Right) APPLICATION OF WOUND VAC (Right) Assessment: Stable status post fourth and fifth ray resection right foot.   Plan: Based dressing against the wound was left intact but reinforced with dry sterile gauze sponges ABDs and Kerlix.  We will keep the patient on bed rest today and hold off on physical therapy until tomorrow.  Patient is awaiting a transfusion as he  did have some significant reduction in his hemoglobin and hematocrit overnight.  Reassess the wound tomorrow.  LOS: 7 days    [name] 12/25/2017

## 2017-12-25 NOTE — Progress Notes (Signed)
Patient ID: Shawn [name], male   DOB: 07-11-1985, 32 y.o.   MRN: [id number]  [employer] PROGRESS NOTE  URI TURNBOUGH VCB:[id number] DOB: May 27, 1986 DOA: 12/18/2017 PCP: Patient, No Pcp Per  HPI/Subjective: Patient stated he lost a lot of blood yesterday.  Nursing stated that he stood up to use the urinal and did not put any pressure on his foot and ended up losing a lot of blood.  Dr. Cleda Mccreedy was called and he put pressure on it for quite a bit of time.  This morning his hemoglobin is 6.4.  Objective: Vitals:   12/24/17 2056 12/25/17 0436  BP: 116/75 (!) 141/92  Pulse: 98 (!) 114  Resp: 18 20  Temp: 97.7 F (36.5 C) 98.8 F (37.1 C)  SpO2: 100% 96%    Filed Weights   12/18/17 1215  Weight: 52.2 kg (115 lb)    ROS: Review of Systems  Constitutional: Negative for chills and fever.  Eyes: Negative for blurred vision.  Respiratory: Negative for cough and shortness of breath.   Cardiovascular: Negative for chest pain.  Gastrointestinal: Negative for abdominal pain, constipation, diarrhea, nausea and vomiting.  Genitourinary: Negative for dysuria.  Musculoskeletal: Positive for joint pain.  Neurological: Negative for dizziness and headaches.   Exam: Physical Exam  Constitutional: He is oriented to person, place, and time.  HENT:  Nose: No mucosal edema.  Mouth/Throat: No oropharyngeal exudate or posterior oropharyngeal edema.  Eyes: Pupils are equal, round, and reactive to light. Conjunctivae, EOM and lids are normal.  Neck: No JVD present. Carotid bruit is not present. No edema present. No thyroid mass and no thyromegaly present.  Cardiovascular: S1 normal and S2 normal. Tachycardia present. Exam reveals no gallop.  No murmur heard. Pulses:      Dorsalis pedis pulses are 2+ on the right side, and 2+ on the left side.  Respiratory: No respiratory distress. He has no wheezes. He has no rhonchi. He has no rales.  GI: Soft. Bowel sounds are normal. There is no  tenderness.  Musculoskeletal:       Right ankle: He exhibits no swelling.       Left ankle: He exhibits no swelling.  Lymphadenopathy:    He has no cervical adenopathy.  Neurological: He is alert and oriented to person, place, and time. No cranial nerve deficit.  Skin: Skin is warm. Nails show no clubbing.  Right foot covered with bandage.  Psychiatric: He has a normal mood and affect.      Data Reviewed: Basic Metabolic Panel: Recent Labs  Lab 12/18/17 1221 12/18/17 1608 12/19/17 0341 12/20/17 0338 12/24/17 1739  NA 130* 132* 134* 132*  --   K 4.2 3.7 3.9 4.1  --   CL 92* 99* 103 102  --   CO2 27 24 24 24   --   GLUCOSE 129* 85 162* 194*  --   BUN 30* 30* 28* 19  --   CREATININE 1.39* 1.21 1.30* 1.18 1.40*  CALCIUM 9.2 8.2* 7.9* 8.2*  --    Liver Function Tests: Recent Labs  Lab 12/18/17 1221 12/18/17 1608  AST 16 19  ALT 13* 12*  ALKPHOS 84 67  BILITOT 0.6 0.5  PROT 7.6 6.4*  ALBUMIN 3.1* 2.6*   No results for input(s): LIPASE, AMYLASE in the last 168 hours. No results for input(s): AMMONIA in the last 168 hours. CBC: Recent Labs  Lab 12/18/17 1221 12/18/17 1608  12/20/17 0338 12/21/17 0550 12/22/17 0400 12/24/17 0351 12/24/17 1429 12/25/17  0423  WBC 23.0* 19.6*   < > 18.2* 14.2* 10.8* 10.9*  --  12.1*  NEUTROABS 19.7* 16.0*  --   --  10.9* 7.6*  --   --  8.9*  HGB 11.5* 8.9*   < > 7.9* 7.8* 7.6* 8.6* 8.0* 6.4*  HCT 32.7* 26.4*   < > 23.4* 23.3* 22.5* 25.8* 24.3* 18.9*  MCV 81.8 83.5   < > 83.3 83.3 83.1 83.9  --  83.0  PLT 298 224   < > 252 304 347 477*  --  482*   < > = values in this interval not displayed.   BNP (last 3 results) Recent Labs    07/20/17 0928  BNP 87.0    CBG: Recent Labs  Lab 12/24/17 1053 12/24/17 1239 12/24/17 1758 12/24/17 2058 12/25/17 0735  GLUCAP 314* 281* 312* 313* 287*    Recent Results (from the past 240 hour(s))  Blood culture (routine x 2)     Status: None   Collection Time: 12/18/17 12:55 PM  Result  Value Ref Range Status   Specimen Description BLOOD R AC  Final   Special Requests   Final    BOTTLES DRAWN AEROBIC AND ANAEROBIC Blood Culture adequate volume   Culture   Final    NO GROWTH 5 DAYS Performed at [facility], [address] [place], [place] [address]    Report Status 12/23/2017 FINAL  Final  Blood culture (routine x 2)     Status: None   Collection Time: 12/18/17  1:00 PM  Result Value Ref Range Status   Specimen Description BLOOD L FOREARM  Final   Special Requests   Final    BOTTLES DRAWN AEROBIC AND ANAEROBIC Blood Culture adequate volume   Culture   Final    NO GROWTH 5 DAYS Performed at [facility], [address] [place], [place] [address]    Report Status 12/23/2017 FINAL  Final  Culture, blood (x 2)     Status: None   Collection Time: 12/18/17  3:43 PM  Result Value Ref Range Status   Specimen Description BLOOD R HAND  Final   Special Requests   Final    BOTTLES DRAWN AEROBIC AND ANAEROBIC Blood Culture adequate volume   Culture   Final    NO GROWTH 5 DAYS Performed at [facility], [address] [place], [place] [address]    Report Status 12/23/2017 FINAL  Final  Culture, blood (x 2)     Status: None   Collection Time: 12/18/17  3:43 PM  Result Value Ref Range Status   Specimen Description BLOOD R HAND  Final   Special Requests   Final    BOTTLES DRAWN AEROBIC AND ANAEROBIC Blood Culture adequate volume   Culture   Final    NO GROWTH 5 DAYS Performed at [facility], [address], [place], [place] [address]    Report Status 12/23/2017 FINAL  Final  Aerobic/Anaerobic Culture (surgical/deep wound)     Status: None   Collection Time: 12/19/17  4:59 AM  Result Value Ref Range Status   Specimen Description   Final    WOUND RIGHT FOOT Performed at [facility], [address], [place], [place] [address]    Special Requests   Final    NONE Performed at [facility], [address] [place], [place] [address]    Gram Stain   Final    NO WBC SEEN RARE GRAM POSITIVE  COCCI IN PAIRS RARE GRAM POSITIVE RODS Performed at [facility], [place] [address] [place], [place] [address]    Culture   Final    FEW STAPHYLOCOCCUS AUREUS WITHIN MIXED ORGANISMS MIXED ANAEROBIC FLORA PRESENT.  CALL LAB IF FURTHER IID REQUIRED.    Report Status 12/23/2017 FINAL  Final   Organism ID, Bacteria STAPHYLOCOCCUS AUREUS  Final      Susceptibility   Staphylococcus aureus - MIC*    CIPROFLOXACIN <=0.5 SENSITIVE Sensitive     ERYTHROMYCIN 0.5 SENSITIVE Sensitive     GENTAMICIN <=0.5 SENSITIVE Sensitive     OXACILLIN <=0.25 SENSITIVE Sensitive     TETRACYCLINE <=1 SENSITIVE Sensitive     VANCOMYCIN 1 SENSITIVE Sensitive     TRIMETH/SULFA <=10 SENSITIVE Sensitive     CLINDAMYCIN <=0.25 SENSITIVE Sensitive     RIFAMPIN <=0.5 SENSITIVE Sensitive     Inducible Clindamycin NEGATIVE Sensitive     * FEW STAPHYLOCOCCUS AUREUS  Aerobic/Anaerobic Culture (surgical/deep wound)     Status: None (Preliminary result)   Collection Time: 12/20/17  1:21 PM  Result Value Ref Range Status   Specimen Description   Final    WOUND RIGHT FOOT ABSCESS Performed at [facility], [address] [place], [place] [address]    Special Requests   Final    NONE Performed at [facility], [address], [place], [place] [address]    Gram Stain   Final    RARE WBC PRESENT,BOTH PMN AND MONONUCLEAR FEW GRAM POSITIVE COCCI Performed at [facility], [place] [address] [place], [place] [address]    Culture   Final    MODERATE STREPTOCOCCUS AGALACTIAE TESTING AGAINST S. AGALACTIAE NOT ROUTINELY PERFORMED DUE TO PREDICTABILITY OF AMP/PEN/VAN SUSCEPTIBILITY. RARE STAPHYLOCOCCUS AUREUS NO ANAEROBES ISOLATED; CULTURE IN PROGRESS FOR 5 DAYS    Report Status PENDING  Incomplete   Organism ID, Bacteria STAPHYLOCOCCUS AUREUS  Final      Susceptibility   Staphylococcus aureus - MIC*     CIPROFLOXACIN <=0.5 SENSITIVE Sensitive     ERYTHROMYCIN <=0.25 SENSITIVE Sensitive     GENTAMICIN <=0.5 SENSITIVE Sensitive     OXACILLIN <=0.25 SENSITIVE Sensitive     TETRACYCLINE <=1 SENSITIVE Sensitive     VANCOMYCIN 1 SENSITIVE Sensitive     TRIMETH/SULFA <=10 SENSITIVE Sensitive     CLINDAMYCIN <=0.25 SENSITIVE Sensitive     RIFAMPIN <=0.5 SENSITIVE Sensitive     Inducible Clindamycin NEGATIVE Sensitive     * RARE STAPHYLOCOCCUS AUREUS     Scheduled Meds: . sodium chloride   Intravenous Once  . ferrous sulfate  325 mg Oral TID WC  . FLUoxetine  20 mg Oral Daily  . insulin aspart  0-5 Units Subcutaneous QHS  . insulin aspart  0-9 Units Subcutaneous TID WC  . insulin aspart  6 Units Subcutaneous TID WC  . insulin glargine  30 Units Subcutaneous Daily  . metoCLOPramide  10 mg Oral TID AC  . sodium chloride flush  3 mL Intravenous Q12H   Continuous Infusions: . vancomycin 750 mg (12/25/17 0506)    Assessment/Plan:  1. Postoperative anemia.  Transfuse 1 unit of packed red blood cells.  Benefits and risks of transfusion explained to patient.  Oral iron started.  Recheck hemoglobin again tomorrow.   2. Right foot with necrotic areas and poor circulation.  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  Wound cultures growing MSSA.  Patient on vancomycin. 3. Uncontrolled diabetes with  hyperglycemia on Lantus and sliding scale.  Increase Lantus dose to 30 units.  Continue sliding scale and short acting insulin.  Hemoglobin A1c very elevated at 16.9. 4. Depression anxiety on Prozac and Xanax 5. History of irritable bowel syndrome  Code Status:     Code Status Orders  (From admission, onward)        Start     Ordered   12/20/17 1542  Full code  Continuous     12/20/17 1541    Code Status History    Date Active Date Inactive Code Status Order ID Comments User Context   11/10/2017 0049 11/11/2017 1632 Full Code [id number]  Amelia Jo, MD  Inpatient   07/13/2017 2112 07/15/2017 1730 Full Code [id number]  Jules Husbands, MD ED     Family Communication: family yesterday Disposition Plan: To be determined based on what the foot looks like on follow-up visits.  Consultants:  Podiatry  Vascular surgery  Antibiotics:  Vancomycin  Time spent: 26 minutes  [place]

## 2017-12-25 NOTE — Clinical Social Work Note (Addendum)
CSW received consult for assistance/questions regarding disability paperwork. CSW will visit the patient when able to provide contact information for the [employer].  CSW received a second consult for the same issue. CSW will visit when able.  Santiago Bumpers, MSW, Latanya Presser [phone]

## 2017-12-26 ENCOUNTER — Encounter: Payer: Self-pay | Admitting: Vascular Surgery

## 2017-12-26 LAB — TYPE AND SCREEN
ABO/RH(D): A NEG
Antibody Screen: NEGATIVE
UNIT DIVISION: 0

## 2017-12-26 LAB — BPAM RBC
BLOOD PRODUCT EXPIRATION DATE: 201907152359
ISSUE DATE / TIME: 201906231029
UNIT TYPE AND RH: 600

## 2017-12-26 LAB — HEMOGLOBIN: HEMOGLOBIN: 8.1 g/dL — AB (ref 13.0–18.0)

## 2017-12-26 LAB — GLUCOSE, CAPILLARY
GLUCOSE-CAPILLARY: 291 mg/dL — AB (ref 65–99)
GLUCOSE-CAPILLARY: 247 mg/dL — AB (ref 65–99)
GLUCOSE-CAPILLARY: 201 mg/dL — AB (ref 65–99)
Glucose-Capillary: 243 mg/dL — ABNORMAL HIGH (ref 65–99)
Glucose-Capillary: 308 mg/dL — ABNORMAL HIGH (ref 65–99)
Glucose-Capillary: 174 mg/dL — ABNORMAL HIGH (ref 65–99)

## 2017-12-26 LAB — CREATININE, SERUM
CREATININE: 1.27 mg/dL — AB (ref 0.61–1.24)
GFR calc Af Amer: >60 mL/min (ref >60)

## 2017-12-26 LAB — VANCOMYCIN, TROUGH: Vancomycin Tr: 21 ug/mL (ref 15–20)

## 2017-12-26 MED ORDER — OXYCODONE-ACETAMINOPHEN 5-325 MG PO TABS: 1.0000 | ORAL_TABLET | Freq: Four times a day (QID) | ORAL | 0 refills | Status: DC | PRN

## 2017-12-26 MED ORDER — DOXYCYCLINE HYCLATE 100 MG PO TABS
100.0000 mg | ORAL_TABLET | Freq: Two times a day (BID) | ORAL | Status: DC
  Administered 2017-12-26 – 2017-12-27 (×2): 100 mg via ORAL
  Filled 2017-12-26 (×3): qty 1

## 2017-12-26 MED ORDER — PROMETHAZINE HCL 12.5 MG PO TABS: 12.5000 mg | ORAL_TABLET | Freq: Four times a day (QID) | ORAL | 0 refills | Status: DC | PRN

## 2017-12-26 MED ORDER — METOCLOPRAMIDE HCL 10 MG PO TABS: 10.0000 mg | ORAL_TABLET | Freq: Three times a day (TID) | ORAL | 0 refills | Status: DC

## 2017-12-26 MED ORDER — FERROUS SULFATE 325 (65 FE) MG PO TABS: 325.0000 mg | ORAL_TABLET | Freq: Two times a day (BID) | ORAL | 0 refills | Status: DC

## 2017-12-26 MED ORDER — FERROUS SULFATE 325 (65 FE) MG PO TABS
325.0000 mg | ORAL_TABLET | Freq: Three times a day (TID) | ORAL | Status: DC
  Administered 2017-12-26 – 2017-12-27 (×3): 325 mg via ORAL
  Filled 2017-12-26 (×3): qty 1

## 2017-12-26 MED ORDER — INSULIN GLARGINE 100 UNIT/ML ~~LOC~~ SOLN
33.0000 [IU] | Freq: Every day | SUBCUTANEOUS | Status: DC
  Administered 2017-12-27: 33 [IU] via SUBCUTANEOUS
  Filled 2017-12-26 (×2): qty 0.33

## 2017-12-26 MED ORDER — DOXYCYCLINE HYCLATE 100 MG PO TABS: 100.0000 mg | ORAL_TABLET | Freq: Two times a day (BID) | ORAL | 0 refills | Status: AC

## 2017-12-26 MED ORDER — INSULIN ASPART 100 UNIT/ML ~~LOC~~ SOLN
8.0000 [IU] | Freq: Three times a day (TID) | SUBCUTANEOUS | Status: DC
  Administered 2017-12-26 – 2017-12-27 (×3): 8 [IU] via SUBCUTANEOUS
  Filled 2017-12-26 (×3): qty 1

## 2017-12-26 MED ORDER — POLYETHYLENE GLYCOL 3350 17 G PO PACK: 17.0000 g | PACK | Freq: Every day | ORAL | 0 refills | Status: DC | PRN

## 2017-12-26 MED ORDER — INSULIN ASPART 100 UNIT/ML ~~LOC~~ SOLN
8.0000 [IU] | Freq: Three times a day (TID) | SUBCUTANEOUS | 0 refills | Status: DC
Start: 2017-12-26 — End: 2018-01-03

## 2017-12-26 NOTE — Progress Notes (Signed)
Inpatient Diabetes Program Recommendations  AACE/ADA: New Consensus Statement on Inpatient Glycemic Control (2015)  Target Ranges:  Prepandial:   less than 140 mg/dL      Peak postprandial:   less than 180 mg/dL (1-2 hours)      Critically ill patients:  140 - 180 mg/dL   Results for Shawn [name], Shawn [name] (MRN [id number]) as of 12/26/2017 08:13  Ref. Range 12/25/2017 07:35 12/25/2017 16:46 12/25/2017 20:46  Glucose-Capillary Latest Ref Range: 65 - 99 mg/dL 287 (H)  11 units NOVOLOG +  30 units LANTUS at 9am  206 (H)  9 units NOVOLOG  139 (H)   Results for Shawn [name], Shawn [name] (MRN [id number]) as of 12/26/2017 08:13  Ref. Range 12/26/2017 07:50  Glucose-Capillary Latest Ref Range: 65 - 99 mg/dL 247 (H)   HomeDM Meds: Lantus 30units daily  CurrentOrders: Lantus 30units daily Novolog Sensitive Correction Scale/ SSI (0-9 units) TID AC + HS                            Novolog 6 units TID with meals     MD- Please consider the following in-hospital insulin adjustments:  1. Increase Lantus slightly to 33 units daily (10% upward adjustment since AM CBG still elevated this AM to 247 mg/dl)  2. Increase Novolog Meal Coverage to: Novolog 8 units TID with meals (hold if pt eats <50% of meal)  3. Patient needs Novolog Rx at time of discharge. Only taking Lantus at home and needs Novolog for both correction and meal coverage at home.  Please give patient a Rx for Novolog at time of discharge.  Likely needs SSI + Novolog Meal Coverage.     --Will follow patient during hospitalization--  Wyn Quaker RN, MSN, CDE Diabetes Coordinator Inpatient Glycemic Control Team Team Pager: [phone] (8a-5p)

## 2017-12-26 NOTE — Care Management Note (Addendum)
Case Management Note  Patient Details  Name: Shawn [name] MRN: [id number] Date of Birth: [birth year]  Subjective/Objective:            Patient to be discharged on continuous negative pressure wound therapy. Podiatrist placed vac on today and recommends dressing changes every three days. RNCM consulted on patient to assess situation. Patient currently lives at home with his father Shawn [name] [phone]. Father provides transportation for patient. Patient works at [employer]. Provided patient with preference of [place] agency and wound vac agency. Given choice patient selected [facility]. Referral placed with Shawn Cornea who agreed to accept patient. DME walker also ordered. [facility] to deliver vac and nursing staff to apply new vac upon delivery. Patient has not followed up with a PCP since 2015, RNCM scheduled appointment with Shawn [name] with [facility]. Appointment placed on discharge instructions. Shawn Pew [name] RN BSN RNCM [phone]        Action/Plan:   Expected Discharge Date:                  Expected Discharge Plan:     In-House Referral:     Discharge planning Services  CM Consult  Post Acute Care Choice:  Durable Medical Equipment, Home Health Choice offered to:  Patient  DME Arranged:  Shawn [name] rolling DME Agency:  [place]:  RN, PT [facility] Agency:  [place]  Status of Service:     If discussed at [place] of Stay Meetings, dates discussed:    Additional Comments:  Shawn Maudlin, RN 12/26/2017, 4:47 PM

## 2017-12-26 NOTE — Progress Notes (Signed)
Patient ID: Shawn [name], male   DOB: 1986-05-23, 32 y.o.   MRN: [id number]   [employer] PROGRESS NOTE  KYUSS HALE JME:[id number] DOB: September 27, 1985 DOA: 12/18/2017 PCP: Patient, No Pcp Per  HPI/Subjective: Patient feeling okay.  Still having some pain in his foot.  Objective: Vitals:   12/26/17 0443 12/26/17 1439  BP: (!) 146/100 (!) 130/93  Pulse: 94 95  Resp: 18 20  Temp: 98.5 F (36.9 C) 98.6 F (37 C)  SpO2: 97% 100%    Filed Weights   12/18/17 1215  Weight: 52.2 kg (115 lb)    ROS: Review of Systems  Constitutional: Negative for chills and fever.  Eyes: Negative for blurred vision.  Respiratory: Negative for cough and shortness of breath.   Cardiovascular: Negative for chest pain.  Gastrointestinal: Negative for abdominal pain, constipation, diarrhea, nausea and vomiting.  Genitourinary: Negative for dysuria.  Musculoskeletal: Positive for joint pain.  Neurological: Negative for dizziness and headaches.   Exam: Physical Exam  Constitutional: He is oriented to person, place, and time.  HENT:  Nose: No mucosal edema.  Mouth/Throat: No oropharyngeal exudate or posterior oropharyngeal edema.  Eyes: Pupils are equal, round, and reactive to light. Conjunctivae, EOM and lids are normal.  Neck: No JVD present. Carotid bruit is not present. No edema present. No thyroid mass and no thyromegaly present.  Cardiovascular: S1 normal and S2 normal. Exam reveals no gallop.  No murmur heard. Pulses:      Dorsalis pedis pulses are 2+ on the right side, and 2+ on the left side.  Respiratory: No respiratory distress. He has no wheezes. He has no rhonchi. He has no rales.  GI: Soft. Bowel sounds are normal. There is no tenderness.  Musculoskeletal:       Right ankle: He exhibits no swelling.       Left ankle: He exhibits no swelling.  Lymphadenopathy:    He has no cervical adenopathy.  Neurological: He is alert and oriented to person, place, and time. No cranial  nerve deficit.  Skin: Skin is warm. Nails show no clubbing.  Right foot covered with bandage.  Psychiatric: He has a normal mood and affect.      Data Reviewed: Basic Metabolic Panel: Recent Labs  Lab 12/20/17 0338 12/24/17 1739 12/26/17 0441  NA 132*  --   --   K 4.1  --   --   CL 102  --   --   CO2 24  --   --   GLUCOSE 194*  --   --   BUN 19  --   --   CREATININE 1.18 1.40* 1.27*  CALCIUM 8.2*  --   --    Liver Function Tests: No results for input(s): AST, ALT, ALKPHOS, BILITOT, PROT, ALBUMIN in the last 168 hours. No results for input(s): LIPASE, AMYLASE in the last 168 hours. No results for input(s): AMMONIA in the last 168 hours. CBC: Recent Labs  Lab 12/20/17 0338 12/21/17 0550 12/22/17 0400 12/24/17 0351 12/24/17 1429 12/25/17 0423 12/26/17 0441  WBC 18.2* 14.2* 10.8* 10.9*  --  12.1*  --   NEUTROABS  --  10.9* 7.6*  --   --  8.9*  --   HGB 7.9* 7.8* 7.6* 8.6* 8.0* 6.4* 8.1*  HCT 23.4* 23.3* 22.5* 25.8* 24.3* 18.9*  --   MCV 83.3 83.3 83.1 83.9  --  83.0  --   PLT 252 304 347 477*  --  482*  --  BNP (last 3 results) Recent Labs    07/20/17 0928  BNP 87.0    CBG: Recent Labs  Lab 12/25/17 1150 12/25/17 1646 12/25/17 2046 12/26/17 0750 12/26/17 1242  GLUCAP 291* 206* 139* 247* 243*    Recent Results (from the past 240 hour(s))  Blood culture (routine x 2)     Status: None   Collection Time: 12/18/17 12:55 PM  Result Value Ref Range Status   Specimen Description BLOOD R AC  Final   Special Requests   Final    BOTTLES DRAWN AEROBIC AND ANAEROBIC Blood Culture adequate volume   Culture   Final    NO GROWTH 5 DAYS Performed at [facility], [address] [place], [place] [address]    Report Status 12/23/2017 FINAL  Final  Blood culture (routine x 2)     Status: None   Collection Time: 12/18/17  1:00 PM  Result Value Ref Range Status   Specimen Description BLOOD L FOREARM  Final   Special Requests   Final    BOTTLES DRAWN  AEROBIC AND ANAEROBIC Blood Culture adequate volume   Culture   Final    NO GROWTH 5 DAYS Performed at [facility], [address] [place], [place] [address]    Report Status 12/23/2017 FINAL  Final  Culture, blood (x 2)     Status: None   Collection Time: 12/18/17  3:43 PM  Result Value Ref Range Status   Specimen Description BLOOD R HAND  Final   Special Requests   Final    BOTTLES DRAWN AEROBIC AND ANAEROBIC Blood Culture adequate volume   Culture   Final    NO GROWTH 5 DAYS Performed at [facility], [address], [place], [place] [address]    Report Status 12/23/2017 FINAL  Final  Culture, blood (x 2)     Status: None   Collection Time: 12/18/17  3:43 PM  Result Value Ref Range Status   Specimen Description BLOOD R HAND  Final   Special Requests   Final    BOTTLES DRAWN AEROBIC AND ANAEROBIC Blood Culture adequate volume   Culture   Final    NO GROWTH 5 DAYS Performed at [facility], [address] [place], [place] [address]    Report Status 12/23/2017 FINAL  Final  Aerobic/Anaerobic Culture (surgical/deep wound)     Status: None   Collection Time: 12/19/17  4:59 AM  Result Value Ref Range Status   Specimen Description   Final    WOUND RIGHT FOOT Performed at [facility], [address] [place], [place] [address]    Special Requests   Final    NONE Performed at [facility], [place] [place], [place] [address]    Gram Stain   Final    NO WBC SEEN RARE GRAM POSITIVE COCCI IN PAIRS RARE GRAM POSITIVE RODS Performed at [facility], [place] [address] [place], [place] [address]    Culture   Final    FEW STAPHYLOCOCCUS AUREUS WITHIN MIXED ORGANISMS MIXED ANAEROBIC FLORA PRESENT.  CALL LAB IF FURTHER IID REQUIRED.    Report Status 12/23/2017 FINAL  Final   Organism ID, Bacteria STAPHYLOCOCCUS AUREUS  Final      Susceptibility   Staphylococcus aureus - MIC*    CIPROFLOXACIN <=0.5 SENSITIVE  Sensitive     ERYTHROMYCIN 0.5 SENSITIVE Sensitive     GENTAMICIN <=0.5 SENSITIVE Sensitive     OXACILLIN <=0.25 SENSITIVE Sensitive  TETRACYCLINE <=1 SENSITIVE Sensitive     VANCOMYCIN 1 SENSITIVE Sensitive     TRIMETH/SULFA <=10 SENSITIVE Sensitive     CLINDAMYCIN <=0.25 SENSITIVE Sensitive     RIFAMPIN <=0.5 SENSITIVE Sensitive     Inducible Clindamycin NEGATIVE Sensitive     * FEW STAPHYLOCOCCUS AUREUS  Aerobic/Anaerobic Culture (surgical/deep wound)     Status: None   Collection Time: 12/20/17  1:21 PM  Result Value Ref Range Status   Specimen Description   Final    WOUND RIGHT FOOT ABSCESS Performed at [facility], [address] [place], [place] [address]    Special Requests   Final    NONE Performed at [facility], [place], [place] [address]    Gram Stain   Final    RARE WBC PRESENT,BOTH PMN AND MONONUCLEAR FEW GRAM POSITIVE COCCI    Culture   Final    MODERATE STREPTOCOCCUS AGALACTIAE TESTING AGAINST S. AGALACTIAE NOT ROUTINELY PERFORMED DUE TO PREDICTABILITY OF AMP/PEN/VAN SUSCEPTIBILITY. RARE STAPHYLOCOCCUS AUREUS NO ANAEROBES ISOLATED Performed at [facility], [place] [address], [place], [place] [address]    Report Status 12/25/2017 FINAL  Final   Organism ID, Bacteria STAPHYLOCOCCUS AUREUS  Final      Susceptibility   Staphylococcus aureus - MIC*    CIPROFLOXACIN <=0.5 SENSITIVE Sensitive     ERYTHROMYCIN <=0.25 SENSITIVE Sensitive     GENTAMICIN <=0.5 SENSITIVE Sensitive     OXACILLIN <=0.25 SENSITIVE Sensitive     TETRACYCLINE <=1 SENSITIVE Sensitive     VANCOMYCIN 1 SENSITIVE Sensitive     TRIMETH/SULFA <=10 SENSITIVE Sensitive     CLINDAMYCIN <=0.25 SENSITIVE Sensitive     RIFAMPIN <=0.5 SENSITIVE Sensitive     Inducible Clindamycin NEGATIVE Sensitive     * RARE STAPHYLOCOCCUS AUREUS     Scheduled Meds: . doxycycline  100 mg Oral BID WC  . ferrous sulfate  325 mg Oral TID WC  . FLUoxetine  20 mg Oral  Daily  . insulin aspart  0-5 Units Subcutaneous QHS  . insulin aspart  0-9 Units Subcutaneous TID WC  . insulin aspart  8 Units Subcutaneous TID WC  . [START ON 12/27/2017] insulin glargine  33 Units Subcutaneous Daily  . metoCLOPramide  10 mg Oral TID AC  . sodium chloride flush  3 mL Intravenous Q12H   Continuous Infusions:   Assessment/Plan:  1. Right foot with necrotic areas and poor circulation.  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  Wound cultures growing MSSA.  Patient  switch from vancomycin over to doxycycline for another 2 weeks.  Wound VAC ordered by podiatry but awaiting insurance authorization for home use.  Home health will need this be set up for changing wound VAC every 3 days.  Follow-up with podiatry closely as outpatient. 2. Postoperative anemia.  Responded to 1 unit of packed red blood cells.  Continue iron supplementation orally 3. Uncontrolled diabetes with hyperglycemia on Lantus and sliding scale.  Increase Lantus dose to 33 units.  8 units of short acting insulin prior to meals..  Hemoglobin A1c very elevated at 16.9. 4. Depression anxiety on Prozac and Xanax 5. History of irritable bowel syndrome  Code Status:     Code Status Orders  (From admission, onward)        Start     Ordered   12/20/17 1542  Full code  Continuous     12/20/17 1541    Code  Status History    Date Active Date Inactive Code Status Order ID Comments User Context   11/10/2017 0049 11/11/2017 1632 Full Code [id number]  Amelia Jo, MD Inpatient   07/13/2017 2112 07/15/2017 1730 Full Code [id number]  Jules Husbands, MD ED     Disposition Plan: awaiting insurance authorization for home use of wound VAC.  If this gets approved today can potentially send him home today.  If not hopefully we can send him home tomorrow.  Consultants:  Podiatry  Vascular surgery  Antibiotics:  Doxycycline  Time spent: 35 minutes  [place]

## 2017-12-26 NOTE — Progress Notes (Signed)
2 Days Post-Op   Subjective/Chief Complaint: Patient seen.  Still significant pain in the right foot but states it is improving some.   Objective: Vital signs in last 24 hours: Temp:  [98.5 F (36.9 C)-99.5 F (37.5 C)] 98.5 F (36.9 C) (06/24 0443) Pulse Rate:  [90-94] 94 (06/24 0443) Resp:  [18-20] 18 (06/24 0443) BP: (132-146)/(88-100) 146/100 (06/24 0443) SpO2:  [96 %-99 %] 97 % (06/24 0443) Last BM Date: 12/25/17  Intake/Output from previous day: 06/23 0701 - 06/24 0700 In: 2735 [P.O.:720; I.V.:1290; Blood:300; IV Piggyback:425] Out: 2542 [Urine:1750] Intake/Output this shift: Total I/O In: 240 [P.O.:240] Out: -   Dressing is dry and intact on the right foot.  Upon removal there is no active bleeding.  No evidence of any additional necrosis along the wound on the lateral aspect of the right foot.  The wound is full-thickness down to exposed bone at the fourth and fifth metatarsals and measures approximately 7 cm x 3 cm at the widest with a depth of 2 to 3 cm.  Plantar wound is stable measuring approximately 3 cm x 5 to 6 mm width with a depth of 1 cm.  No purulence on the dressing.  Lab Results:  Recent Labs    12/24/17 0351 12/24/17 1429 12/25/17 0423 12/26/17 0441  WBC 10.9*  --  12.1*  --   HGB 8.6* 8.0* 6.4* 8.1*  HCT 25.8* 24.3* 18.9*  --   PLT 477*  --  482*  --    BMET Recent Labs    12/24/17 1739 12/26/17 0441  CREATININE 1.40* 1.27*   PT/INR No results for input(s): LABPROT, INR in the last 72 hours. ABG No results for input(s): PHART, HCO3 in the last 72 hours.  Invalid input(s): PCO2, PO2  Studies/Results: No results found.  Anti-infectives: Anti-infectives (From admission, onward)   Start     Dose/Rate Route Frequency Ordered Stop   12/26/17 1700  doxycycline (VIBRA-TABS) tablet 100 mg     100 mg Oral 2 times daily with meals 12/26/17 1303 01/09/18 1659   12/24/17 0600  clindamycin (CLEOCIN) IVPB 300 mg     300 mg 100 mL/hr over 30  Minutes Intravenous On call to O.R. 12/23/17 1331 12/23/17 1824   12/22/17 0000  vancomycin (VANCOCIN) 1,250 mg in sodium chloride 0.9 % 250 mL IVPB  Status:  Discontinued     1,250 mg 166.7 mL/hr over 90 Minutes Intravenous Every 18 hours 12/21/17 0658 12/21/17 1005   12/21/17 1800  vancomycin (VANCOCIN) IVPB 750 mg/150 ml premix  Status:  Discontinued     750 mg 150 mL/hr over 60 Minutes Intravenous Every 12 hours 12/21/17 1005 12/26/17 1303   12/19/17 1800  vancomycin (VANCOCIN) IVPB 1000 mg/200 mL premix  Status:  Discontinued     1,000 mg 200 mL/hr over 60 Minutes Intravenous Every 18 hours 12/19/17 0948 12/21/17 0658   12/19/17 1045  aztreonam (AZACTAM) 2 g in sodium chloride 0.9 % 100 mL IVPB  Status:  Discontinued     2 g 200 mL/hr over 30 Minutes Intravenous Every 8 hours 12/19/17 1033 12/23/17 1151   12/19/17 0000  vancomycin (VANCOCIN) IVPB 1000 mg/200 mL premix  Status:  Discontinued     1,000 mg 200 mL/hr over 60 Minutes Intravenous Every 24 hours 12/18/17 1403 12/19/17 0948   12/18/17 1445  vancomycin (VANCOCIN) IVPB 1000 mg/200 mL premix  Status:  Discontinued     1,000 mg 200 mL/hr over 60 Minutes Intravenous  Once 12/18/17  1442 12/18/17 1444   12/18/17 1300  vancomycin (VANCOCIN) IVPB 1000 mg/200 mL premix     1,000 mg 200 mL/hr over 60 Minutes Intravenous  Once 12/18/17 1248 12/18/17 1445      Assessment/Plan: s/p Procedure(s): IRRIGATION AND DEBRIDEMENT FOOT (Right) AMPUTATION RAY (Right) APPLICATION OF WOUND VAC (Right) Assessment: Stable status post fourth and fifth ray resection right foot   Plan: Wound VAC was reapplied to the right foot today and good seal was noted.  At this point I think patient should be stable for discharge when medically appropriate.  He will need home health care with wound VAC changed every 3 days.  At this point I will plan for follow-up outpatient in a couple of weeks.  LOS: 8 days    [name] 12/26/2017

## 2017-12-26 NOTE — Progress Notes (Signed)
[facility]  Cell: [phone]   If patient discharges after hours, please call [phone].   Florene Glen 12/26/2017, 4:23 PM

## 2017-12-26 NOTE — Progress Notes (Signed)
Pharmacy Antibiotic Note  Shawn [name] is a 32 y.o. male admitted on 12/18/2017 with cellulitis.  Pharmacy has been consulted for vancomycin dosing. He was recently discharged from [facility] after being treated for DKA. His Scr is modestly elevated on this admission with a baseline near 1   6/23 Patient continues on Vancomycin 750 IV q12h. Slight bump in SCr today.  Plan: Continue current dose of vancomycin 750mg  q 12 hr Will check a Vancomycin trough and SCr prior to AM dose  06/24 @ 0500 VT 21 drawn correctly, Scr improving. Will continue current regimen and will recheck VT 06/25 w/ am labs.  Height: 5\' 6"  (167.6 cm) Weight: 115 lb (52.2 kg) IBW/kg (Calculated) : 63.8  Temp (24hrs), Avg:99 F (37.2 C), Min:98.5 F (36.9 C), Max:99.5 F (37.5 C)  Recent Labs  Lab 12/20/17 0338  12/21/17 0550 12/22/17 0400 12/23/17 1847 12/24/17 0351 12/24/17 1739 12/25/17 0423 12/26/17 0441  WBC 18.2*  --  14.2* 10.8*  --  10.9*  --  12.1*  --   CREATININE 1.18  --   --   --   --   --  1.40*  --  1.27*  VANCOTROUGH  --    < > 13*  --  19  --   --   --  21*   < > = values in this interval not displayed.    Estimated Creatinine Clearance: 62.8 mL/min (A) (by C-G formula based on SCr of 1.27 mg/dL (H)).    Allergies  Allergen Reactions  . Banana Hives, Nausea And Vomiting and Rash  . Keflex [Cephalexin] Anaphylaxis  . Onion Hives, Nausea And Vomiting and Rash  . Sulfa Antibiotics Anaphylaxis  . Grapeseed Extract [Nutritional Supplements] Itching  . Shellfish Allergy Hives    "ALL SEAFOOD"    Antimicrobials this admission: Vancomycin 6/16 >>   Microbiology results: 6/16 BCx: pending  Thank you for allowing pharmacy to be a part of this patient's care.  Tobie Lords, PharmD, BCPS Clinical Pharmacist 12/26/2017

## 2017-12-27 LAB — BASIC METABOLIC PANEL
Anion gap: 6 (ref 5–15)
BUN: 19 mg/dL (ref 6–20)
CO2: 28 mmol/L (ref 22–32)
CREATININE: 1.08 mg/dL (ref 0.61–1.24)
Calcium: 8 mg/dL — ABNORMAL LOW (ref 8.9–10.3)
Chloride: 104 mmol/L (ref 98–111)
GFR calc non Af Amer: >60 mL/min (ref >60)
Glucose, Bld: 196 mg/dL — ABNORMAL HIGH (ref 70–99)
Potassium: 3.9 mmol/L (ref 3.5–5.1)
Sodium: 138 mmol/L (ref 135–145)

## 2017-12-27 LAB — GLUCOSE, CAPILLARY
GLUCOSE-CAPILLARY: 183 mg/dL — AB (ref 70–99)
GLUCOSE-CAPILLARY: 160 mg/dL — AB (ref 70–99)
Glucose-Capillary: 222 mg/dL — ABNORMAL HIGH (ref 70–99)

## 2017-12-27 LAB — SURGICAL PATHOLOGY

## 2017-12-27 MED ORDER — OCUVITE-LUTEIN PO CAPS
1.0000 | ORAL_CAPSULE | Freq: Every day | ORAL | Status: DC
  Filled 2017-12-27: qty 1

## 2017-12-27 MED ORDER — GLUCERNA SHAKE PO LIQD: 237.0000 mL | Freq: Three times a day (TID) | ORAL | Status: DC

## 2017-12-27 MED ORDER — ALPRAZOLAM 0.25 MG PO TABS: 0.2500 mg | ORAL_TABLET | Freq: Three times a day (TID) | ORAL | 0 refills | Status: DC | PRN

## 2017-12-27 MED ORDER — GLUCERNA SHAKE PO LIQD: 237.0000 mL | Freq: Three times a day (TID) | ORAL | 0 refills | Status: DC

## 2017-12-27 MED ORDER — INSULIN SYRINGES (DISPOSABLE) U-100 0.3 ML MISC: 1.0000 | Freq: Three times a day (TID) | 0 refills | Status: DC

## 2017-12-27 NOTE — Progress Notes (Signed)
Initial Nutrition Assessment  DOCUMENTATION CODES:   Severe malnutrition in context of chronic illness  INTERVENTION:   Glucerna Shake po TID, each supplement provides 220 kcal and 10 grams of protein  Ocuvite daily for wound healing (provides zinc, vitamin A, vitamin C, Vitamin E, copper, and selenium)  NUTRITION DIAGNOSIS:   Severe Malnutrition related to chronic illness(IBS, uncontrolled DM) as evidenced by moderate to severe fat depletions, moderate to severe muscle depletions.  GOAL:   Patient will meet greater than or equal to 90% of their needs  MONITOR:   PO intake, Supplement acceptance, Labs, Weight trends, Skin, I & O's  REASON FOR ASSESSMENT:   Other (Comment)(low BMI)    ASSESSMENT:   32 y.o. male with type I DM since age 41yr, IBS, gastroparesis, presents with right foot swelling, erythema, pain and discharge with systemic symptoms.  Pt s/p I & D 6/18, 6/21  Met with pt in room today. RD familiar with this pt from multiple previous admits. Pt with h/o type 1 DM diagnosed at age [age] Pt has chronic vomiting secondary to gastroparesis and often is unable to keep food down. Pt also with h/o IBS and chronic diarrhea, takes immodium every other day. Pt reports a h/o lactose intolerance but reports lately, he has been able to drink milk. Pt has allergies to bananas, onions, and seafood. Pt has had significant wt loss in the past; pt reports that he used to weigh 160-170lbs several years ago. Per chart, pt appears weight stable for the past 6 months but weights all appear reported. Pt weighed 141.2 on bed scale today. Pt reports that he has been drinking Glucerna at home on a daily basis. Pt currently eating 100% of meals. Pt to possibly discharge today. Wound VAC in place. Recommend ocuvite daily to encourage wound healing. Continue supplements at home. Pt reports he takes a men's daily MV gummi daily.   Medications reviewed and include: doxycycline, ferrous sulfate,  insulin, reglan, oxycodone, phenergan  Labs reviewed: cbgs- 308, 201, 174, 183, 222 x 24 hrs AIC- 16.9(H)- 6/17  NUTRITION - FOCUSED PHYSICAL EXAM:    Most Recent Value  Orbital Region  No depletion  Upper Arm Region  Severe depletion  Thoracic and Lumbar Region  Moderate depletion  Buccal Region  No depletion  Temple Region  Moderate depletion  Clavicle Bone Region  Mild depletion  Clavicle and Acromion Bone Region  Mild depletion  Scapular Bone Region  Mild depletion  Dorsal Hand  Mild depletion  Patellar Region  Severe depletion  Anterior Thigh Region  Severe depletion  Posterior Calf Region  Moderate depletion  Edema (RD Assessment)  Mild [foot]  Hair  Reviewed  Eyes  Reviewed  Mouth  Reviewed  Skin  Reviewed  Nails  Reviewed     Diet Order:   Diet Order           Diet Carb Modified Fluid consistency: Thin; Room service appropriate? Yes  Diet effective now         EDUCATION NEEDS:   Education needs have been addressed  Skin:  Skin Assessment: Reviewed RN Assessment(incision foot )  Last BM:  6/25  Height:   Ht Readings from Last 1 Encounters:  12/18/17 5' 6" (1.676 m)    Weight:   Wt Readings from Last 1 Encounters:  12/18/17 115 lb (52.2 kg)    Ideal Body Weight:  64.5 kg  BMI:  Body mass index is 18.56 kg/m.  Estimated Nutritional Needs:   Kcal:  1900-2000kcal/day   Protein:  78-89g/day   Fluid:  >1.6L/day   [name] MS, RD, LDN Pager #[phone] Office#- [phone] After Hours Pager: [phone]

## 2017-12-27 NOTE — Progress Notes (Signed)
MD- If patient discharges home today, please make sure to give patient a Rx for Novolog SSI and Novolog Meal Coverage.  I believe patient told me he uses the Lantus vial, so please give pt Rx for the Novolog vial.  Order # [id number]  May also need extra Insulin Syringes  0.36ml Insulin Syringes- Order # [id number]    Thank you!  --Will follow patient during hospitalization--  Wyn Quaker RN, MSN, CDE Diabetes Coordinator Inpatient Glycemic Control Team Team Pager: [phone] (8a-5p)

## 2017-12-27 NOTE — Discharge Summary (Addendum)
[facility] NAME: Shawn [name]    MR#:  [id number]  DATE OF BIRTH:  May 22, 1986  DATE OF ADMISSION:  12/18/2017 ADMITTING PHYSICIAN: Nicholes Mango, MD  DATE OF DISCHARGE: 12/27/2017  PRIMARY CARE PHYSICIAN: Valera Castle, MD    ADMISSION DIAGNOSIS:  Hyponatremia [E87.1] Acute renal insufficiency [N28.9] Right foot infection [L08.9] Sepsis, due to unspecified organism ([place]) [A41.9] Non-intractable vomiting with nausea, unspecified vomiting type [R11.2]  DISCHARGE DIAGNOSIS:  Active Problems:   Sepsis ([place])   SECONDARY DIAGNOSIS:   Past Medical History:  Diagnosis Date  . Diabetes mellitus without complication ([place])   . GERD (gastroesophageal reflux disease)   . IBS (irritable bowel syndrome)     HOSPITAL COURSE:   1.  Clinical sepsis present on admission, right foot with necrotic areas and poor circulation.  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  Wound cultures growing out MSSA.  The patient was on vancomycin while here.  I will switch over to doxycycline for 2 more weeks.  Wound VAC recommended by podiatry.  Patient unable to do the co-pay for the wound VAC.  [facility] with [facility] and do the wound VAC for him.  Follow-up closely with podiatry as outpatient.  Home health to change the wound VAC every 3 days.  Patient had 2 surgeries by podiatry and a angiogram during the hospital course. 2.  Postoperative anemia.  Responded to 1 unit of packed red blood cells.  Continue oral iron supplementation. 3.  Uncontrolled diabetes with hyperglycemia on Lantus increased to 33 units.  Short acting insulin 8 units prior to meals.  Hemoglobin A1c very elevated at 16.9. 4.  Depression anxiety on Prozac and xanax 5.  History of irritable bowel syndrome   DISCHARGE CONDITIONS:   Satisfactory  CONSULTS OBTAINED:  Treatment Team:  Samara Deist, DPM Schnier, Dolores Lory,  MD  DRUG ALLERGIES:   Allergies  Allergen Reactions  . Banana Hives, Nausea And Vomiting and Rash  . Keflex [Cephalexin] Anaphylaxis  . Onion Hives, Nausea And Vomiting and Rash  . Sulfa Antibiotics Anaphylaxis  . Grapeseed Extract [Nutritional Supplements] Itching  . Shellfish Allergy Hives    "ALL SEAFOOD"    DISCHARGE MEDICATIONS:   Allergies as of 12/27/2017      Reactions   Banana Hives, Nausea And Vomiting, Rash   Keflex [cephalexin] Anaphylaxis   Onion Hives, Nausea And Vomiting, Rash   Sulfa Antibiotics Anaphylaxis   Grapeseed Extract [nutritional Supplements] Itching   Shellfish Allergy Hives   "ALL SEAFOOD"      Medication List    STOP taking these medications   insulin glargine 100 UNIT/ML injection Commonly known as:  LANTUS     TAKE these medications   ALPRAZolam 0.25 MG tablet Commonly known as:  XANAX Take 1 tablet (0.25 mg total) by mouth 3 (three) times daily as needed for anxiety.   doxycycline 100 MG tablet Commonly known as:  VIBRA-TABS Take 1 tablet (100 mg total) by mouth 2 (two) times daily with a meal for 14 days.   feeding supplement (GLUCERNA SHAKE) Liqd Take 237 mLs by mouth 3 (three) times daily between meals.   ferrous sulfate 325 (65 FE) MG tablet Take 1 tablet (325 mg total) by mouth 2 (two) times daily with a meal.   FLUoxetine 20 MG capsule Commonly known as:  PROZAC Take 1 capsule (20 mg total) by mouth daily.  insulin aspart 100 UNIT/ML injection Commonly known as:  novoLOG Inject 8 Units into the skin 3 (three) times daily with meals.   Insulin Syringes (Disposable) U-100 0.3 ML Misc 1 Syringe by Does not apply route 4 (four) times daily -  with meals and at bedtime.   lisinopril 10 MG tablet Commonly known as:  PRINIVIL,ZESTRIL Take 1 tablet (10 mg total) by mouth daily.   metoCLOPramide 10 MG tablet Commonly known as:  REGLAN Take 1 tablet (10 mg total) by mouth 3 (three) times daily before meals.    oxyCODONE-acetaminophen 5-325 MG tablet Commonly known as:  PERCOCET/ROXICET Take 1 tablet by mouth every 6 (six) hours as needed for moderate pain.   polyethylene glycol packet Commonly known as:  MIRALAX Take 17 g by mouth daily as needed for moderate constipation.   promethazine 12.5 MG tablet Commonly known as:  PHENERGAN Take 1 tablet (12.5 mg total) by mouth every 6 (six) hours as needed for nausea or vomiting.            Durable Medical Equipment  (From admission, onward)        Start     Ordered   12/27/17 0813  For home use only DME Walker rolling  Once    Question:  Patient needs a walker to treat with the following condition  Answer:  Open wound   12/27/17 0813   12/26/17 1649  For home use only DME Walker rolling  Once    Question:  Patient needs a walker to treat with the following condition  Answer:  Wound healing, delayed   12/26/17 1651   12/22/17 1241  For home use only DME Crutches  Once     12/22/17 1240       DISCHARGE INSTRUCTIONS:   Follow-up with PMD 1 week Follow-up with podiatry 10 days  If you experience worsening of your admission symptoms, develop shortness of breath, life threatening emergency, suicidal or homicidal thoughts you must seek medical attention immediately by calling 911 or calling your MD immediately  if symptoms less severe.  You Must read complete instructions/literature along with all the possible adverse reactions/side effects for all the Medicines you take and that have been prescribed to you. Take any new Medicines after you have completely understood and accept all the possible adverse reactions/side effects.   Please note  You were cared for by a hospitalist during your hospital stay. If you have any questions about your discharge medications or the care you received while you were in the hospital after you are discharged, you can call the unit and asked to speak with the hospitalist on call if the hospitalist that took  care of you is not available. Once you are discharged, your primary care physician will handle any further medical issues. Please note that NO REFILLS for any discharge medications will be authorized once you are discharged, as it is imperative that you return to your primary care physician (or establish a relationship with a primary care physician if you do not have one) for your aftercare needs so that they can reassess your need for medications and monitor your lab values.    Today   CHIEF COMPLAINT:   Chief Complaint  Patient presents with  . Foot Injury    HISTORY OF PRESENT ILLNESS:  Shawn [name]  is a 32 y.o. male presented with foot pain   VITAL SIGNS:  Blood pressure (!) 129/92, pulse 93, temperature 98.6 F (37 C), temperature source Oral,  resp. rate 20, height 5\' 6"  (1.676 m), weight 52.2 kg (115 lb), SpO2 98 %.   PHYSICAL EXAMINATION:  GENERAL:  32 y.o.-year-old patient lying in the bed with no acute distress.  EYES: Pupils equal, round, reactive to light and accommodation. No scleral icterus. Extraocular muscles intact.  HEENT: Head atraumatic, normocephalic. Oropharynx and nasopharynx clear.  NECK:  Supple, no jugular venous distention. No thyroid enlargement, no tenderness.  LUNGS: Normal breath sounds bilaterally, no wheezing, rales,rhonchi or crepitation. No use of accessory muscles of respiration.  CARDIOVASCULAR: S1, S2 normal. No murmurs, rubs, or gallops.  ABDOMEN: Soft, non-tender, non-distended. Bowel sounds present. No organomegaly or mass.  EXTREMITIES: No pedal edema, cyanosis, or clubbing.  NEUROLOGIC: Cranial nerves II through XII are intact. Muscle strength 5/5 in all extremities. Sensation intact. Gait not checked.  PSYCHIATRIC: The patient is alert and oriented x 3.  SKIN: Right foot covered with bandage and wound VAC  DATA REVIEW:   CBC Recent Labs  Lab 12/25/17 0423 12/26/17 0441  WBC 12.1*  --   HGB 6.4* 8.1*  HCT 18.9*  --   PLT 482*   --     Chemistries  Recent Labs  Lab 12/27/17 0425  NA 138  K 3.9  CL 104  CO2 28  GLUCOSE 196*  BUN 19  CREATININE 1.08  CALCIUM 8.0*     Microbiology Results  Results for orders placed or performed during the hospital encounter of 12/18/17  Blood culture (routine x 2)     Status: None   Collection Time: 12/18/17 12:55 PM  Result Value Ref Range Status   Specimen Description BLOOD R AC  Final   Special Requests   Final    BOTTLES DRAWN AEROBIC AND ANAEROBIC Blood Culture adequate volume   Culture   Final    NO GROWTH 5 DAYS Performed at [facility], [address] [place], [place] [address]    Report Status 12/23/2017 FINAL  Final  Blood culture (routine x 2)     Status: None   Collection Time: 12/18/17  1:00 PM  Result Value Ref Range Status   Specimen Description BLOOD L FOREARM  Final   Special Requests   Final    BOTTLES DRAWN AEROBIC AND ANAEROBIC Blood Culture adequate volume   Culture   Final    NO GROWTH 5 DAYS Performed at [facility], [address] [place], [place] [address]    Report Status 12/23/2017 FINAL  Final  Culture, blood (x 2)     Status: None   Collection Time: 12/18/17  3:43 PM  Result Value Ref Range Status   Specimen Description BLOOD R HAND  Final   Special Requests   Final    BOTTLES DRAWN AEROBIC AND ANAEROBIC Blood Culture adequate volume   Culture   Final    NO GROWTH 5 DAYS Performed at [facility], [address] [place], [place] [address]    Report Status 12/23/2017 FINAL  Final  Culture, blood (x 2)     Status: None   Collection Time: 12/18/17  3:43 PM  Result Value Ref Range Status   Specimen Description BLOOD R HAND  Final   Special Requests   Final    BOTTLES DRAWN AEROBIC AND ANAEROBIC Blood Culture adequate volume   Culture   Final    NO GROWTH 5 DAYS Performed at [facility], [address], [place], [place] [address]    Report Status 12/23/2017 FINAL  Final  Aerobic/Anaerobic Culture (surgical/deep wound)     Status: None   Collection Time: 12/19/17  4:59 AM  Result Value Ref Range Status   Specimen Description   Final    WOUND RIGHT FOOT Performed at [facility], [address] [place], [place] [address]    Special Requests   Final    NONE Performed at [facility], [address] [place], [place] [address]    Gram Stain   Final    NO WBC SEEN RARE GRAM POSITIVE COCCI IN PAIRS RARE GRAM POSITIVE RODS Performed at [facility], [place] [address], [place], [place] [address]    Culture   Final    FEW STAPHYLOCOCCUS AUREUS WITHIN MIXED ORGANISMS MIXED ANAEROBIC FLORA PRESENT.  CALL LAB IF FURTHER IID REQUIRED.    Report Status 12/23/2017 FINAL  Final   Organism ID, Bacteria STAPHYLOCOCCUS AUREUS  Final      Susceptibility   Staphylococcus aureus - MIC*    CIPROFLOXACIN <=0.5 SENSITIVE Sensitive     ERYTHROMYCIN 0.5 SENSITIVE Sensitive     GENTAMICIN <=0.5 SENSITIVE Sensitive     OXACILLIN <=0.25 SENSITIVE Sensitive     TETRACYCLINE <=1 SENSITIVE Sensitive     VANCOMYCIN 1 SENSITIVE Sensitive     TRIMETH/SULFA <=10 SENSITIVE Sensitive     CLINDAMYCIN <=0.25 SENSITIVE Sensitive     RIFAMPIN <=0.5 SENSITIVE Sensitive     Inducible Clindamycin NEGATIVE Sensitive     * FEW STAPHYLOCOCCUS AUREUS  Aerobic/Anaerobic Culture (surgical/deep wound)     Status: None   Collection Time: 12/20/17  1:21 PM  Result Value Ref Range Status   Specimen Description   Final    WOUND RIGHT FOOT ABSCESS Performed at [facility], [address], [place], [place] [address]    Special Requests   Final    NONE Performed at [facility], [place],  [address]    Gram Stain   Final    RARE WBC PRESENT,BOTH PMN AND MONONUCLEAR FEW GRAM POSITIVE COCCI    Culture   Final    MODERATE STREPTOCOCCUS AGALACTIAE TESTING AGAINST S. AGALACTIAE NOT ROUTINELY PERFORMED DUE TO PREDICTABILITY OF  AMP/PEN/VAN SUSCEPTIBILITY. RARE STAPHYLOCOCCUS AUREUS NO ANAEROBES ISOLATED Performed at [facility], [place] [address] [place],  [address]    Report Status 12/25/2017 FINAL  Final   Organism ID, Bacteria STAPHYLOCOCCUS AUREUS  Final      Susceptibility   Staphylococcus aureus - MIC*    CIPROFLOXACIN <=0.5 SENSITIVE Sensitive     ERYTHROMYCIN <=0.25 SENSITIVE Sensitive     GENTAMICIN <=0.5 SENSITIVE Sensitive     OXACILLIN <=0.25 SENSITIVE Sensitive     TETRACYCLINE <=1 SENSITIVE Sensitive     VANCOMYCIN 1 SENSITIVE Sensitive     TRIMETH/SULFA <=10 SENSITIVE Sensitive     CLINDAMYCIN <=0.25 SENSITIVE Sensitive     RIFAMPIN <=0.5 SENSITIVE Sensitive     Inducible Clindamycin NEGATIVE Sensitive     * RARE STAPHYLOCOCCUS AUREUS       Management plans discussed with the patient, family and they are in agreement.  CODE STATUS:     Code Status Orders  (From admission, onward)        Start     Ordered   12/20/17 1542  Full code  Continuous     12/20/17 1541    Code Status History    Date Active Date Inactive Code Status Order ID Comments User Context   11/10/2017 0049 11/11/2017 1632 Full  Code [id number]  Amelia Jo, MD Inpatient   07/13/2017 2112 07/15/2017 1730 Full Code [id number]  Jules Husbands, MD ED      TOTAL TIME TAKING CARE OF THIS PATIENT: 35 minutes.    Loletha Grayer M.D on 12/27/2017 at 3:58 PM  Between 7am to 6pm - Pager - [phone]  After 6pm go to [email or web address] - password [email or web address] [facility]  [phone]  CC: Primary care physician; Valera Castle, MD

## 2017-12-27 NOTE — Care Management (Addendum)
Spoke with Dr. Leslye Peer possible discharge to home today. Shawn [name], [facility] representative states that there is a  co-pay of $565.00 month for wound vac.  Shawn [name] is unable to make this payment. [facility] will help with co-pay.  Discussed the importance of keeping all follow-up appointments.  States his father will transport [name] RN MSN [facility] [phone]

## 2017-12-29 ENCOUNTER — Emergency Department: Payer: BLUE CROSS/BLUE SHIELD

## 2017-12-29 ENCOUNTER — Inpatient Hospital Stay
Admission: EM | Admit: 2017-12-29 | Discharge: 2018-01-03 | Disposition: A | Discharge status: Home / Self Care | Payer: BLUE CROSS/BLUE SHIELD | Source: Home / Self Care | Attending: Specialist | Admitting: Specialist

## 2017-12-29 DIAGNOSIS — E119 Type 2 diabetes mellitus without complications: Secondary | ICD-10-CM

## 2017-12-29 DIAGNOSIS — R739 Hyperglycemia, unspecified: Secondary | ICD-10-CM

## 2017-12-29 DIAGNOSIS — L03115 Cellulitis of right lower limb: Secondary | ICD-10-CM

## 2017-12-29 DIAGNOSIS — L0390 Cellulitis, unspecified: Secondary | ICD-10-CM | POA: Diagnosis present

## 2017-12-29 DIAGNOSIS — Z794 Long term (current) use of insulin: Secondary | ICD-10-CM

## 2017-12-29 DIAGNOSIS — A419 Sepsis, unspecified organism: Secondary | ICD-10-CM

## 2017-12-29 LAB — COMPREHENSIVE METABOLIC PANEL
ALBUMIN: 2.3 g/dL — AB (ref 3.5–5.0)
ALK PHOS: 90 U/L (ref 38–126)
ALT: 20 U/L (ref 0–44)
AST: 23 U/L (ref 15–41)
Anion gap: 10 (ref 5–15)
BILIRUBIN TOTAL: 0.3 mg/dL (ref 0.3–1.2)
BUN: 26 mg/dL — ABNORMAL HIGH (ref 6–20)
CALCIUM: 8.1 mg/dL — AB (ref 8.9–10.3)
CO2: 23 mmol/L (ref 22–32)
Chloride: 103 mmol/L (ref 98–111)
Creatinine, Ser: 1.34 mg/dL — ABNORMAL HIGH (ref 0.61–1.24)
GFR calc Af Amer: >60 mL/min (ref >60)
GFR calc non Af Amer: >60 mL/min (ref >60)
GLUCOSE: 425 mg/dL — AB (ref 70–99)
Potassium: 3.4 mmol/L — ABNORMAL LOW (ref 3.5–5.1)
Sodium: 136 mmol/L (ref 135–145)
TOTAL PROTEIN: 6.4 g/dL — AB (ref 6.5–8.1)

## 2017-12-29 LAB — CBC WITH DIFFERENTIAL/PLATELET
Basophils Absolute: 0.1 10*3/uL (ref 0–0.1)
Basophils Relative: 1 %
Eosinophils Absolute: 0.2 10*3/uL (ref 0–0.7)
Eosinophils Relative: 2 %
HCT: 23.4 % — ABNORMAL LOW (ref 40.0–52.0)
HEMOGLOBIN: 7.7 g/dL — AB (ref 13.0–18.0)
LYMPHS ABS: 2.3 10*3/uL (ref 1.0–3.6)
Lymphocytes Relative: 14 %
MCH: 28.3 pg (ref 26.0–34.0)
MCHC: 32.7 g/dL (ref 32.0–36.0)
MCV: 86.7 fL (ref 80.0–100.0)
MONOS PCT: 8 %
Monocytes Absolute: 1.3 10*3/uL — ABNORMAL HIGH (ref 0.2–1.0)
Neutro Abs: 11.9 10*3/uL — ABNORMAL HIGH (ref 1.4–6.5)
Neutrophils Relative %: 75 %
Platelets: 609 10*3/uL — ABNORMAL HIGH (ref 150–440)
RBC: 2.7 MIL/uL — ABNORMAL LOW (ref 4.40–5.90)
RDW: 14.8 % — ABNORMAL HIGH (ref 11.5–14.5)
WBC: 15.8 10*3/uL — ABNORMAL HIGH (ref 3.8–10.6)

## 2017-12-29 LAB — GLUCOSE, CAPILLARY: Glucose-Capillary: 270 mg/dL — ABNORMAL HIGH (ref 70–99)

## 2017-12-29 LAB — LACTIC ACID, PLASMA: Lactic Acid, Venous: 1.8 mmol/L (ref 0.5–1.9)

## 2017-12-29 MED ORDER — SODIUM CHLORIDE 0.9 % IV BOLUS (SEPSIS)
1000.0000 mL | Freq: Once | INTRAVENOUS | Status: AC
  Administered 2017-12-29: 1000 mL via INTRAVENOUS

## 2017-12-29 MED ORDER — HYDROMORPHONE HCL 1 MG/ML IJ SOLN
1.0000 mg | Freq: Once | INTRAMUSCULAR | Status: AC
  Administered 2017-12-29: 1 mg via INTRAVENOUS

## 2017-12-29 MED ORDER — INSULIN ASPART 100 UNIT/ML ~~LOC~~ SOLN
5.0000 [IU] | Freq: Once | SUBCUTANEOUS | Status: AC
  Administered 2017-12-29: 5 [IU] via INTRAVENOUS
  Filled 2017-12-29: qty 1

## 2017-12-29 MED ORDER — VANCOMYCIN HCL IN DEXTROSE 750-5 MG/150ML-% IV SOLN
750.0000 mg | INTRAVENOUS | Status: DC
  Administered 2017-12-30: 750 mg via INTRAVENOUS
  Filled 2017-12-29 (×2): qty 150

## 2017-12-29 MED ORDER — INSULIN GLARGINE 100 UNIT/ML ~~LOC~~ SOLN
33.0000 [IU] | Freq: Once | SUBCUTANEOUS | Status: AC
  Administered 2017-12-29: 33 [IU] via SUBCUTANEOUS
  Filled 2017-12-29: qty 0.33

## 2017-12-29 MED ORDER — HYDROMORPHONE HCL 1 MG/ML IJ SOLN
INTRAMUSCULAR | Status: AC
  Administered 2017-12-29: 1 mg via INTRAVENOUS
  Filled 2017-12-29: qty 1

## 2017-12-29 MED ORDER — PROMETHAZINE HCL 25 MG/ML IJ SOLN
INTRAMUSCULAR | Status: AC
  Administered 2017-12-29: 12.5 mg via INTRAVENOUS
  Filled 2017-12-29: qty 1

## 2017-12-29 MED ORDER — SODIUM CHLORIDE 0.9 % IV BOLUS (SEPSIS)
500.0000 mL | Freq: Once | INTRAVENOUS | Status: AC
  Administered 2017-12-29: 500 mL via INTRAVENOUS

## 2017-12-29 MED ORDER — ONDANSETRON HCL 4 MG/2ML IJ SOLN
INTRAMUSCULAR | Status: AC
  Filled 2017-12-29: qty 2

## 2017-12-29 MED ORDER — METRONIDAZOLE IN NACL 5-0.79 MG/ML-% IV SOLN
500.0000 mg | Freq: Once | INTRAVENOUS | Status: AC
  Administered 2017-12-29: 500 mg via INTRAVENOUS
  Filled 2017-12-29: qty 100

## 2017-12-29 MED ORDER — ONDANSETRON HCL 4 MG/2ML IJ SOLN
4.0000 mg | Freq: Once | INTRAMUSCULAR | Status: DC
Start: 2017-12-29 — End: 2018-01-03

## 2017-12-29 MED ORDER — SODIUM CHLORIDE 0.9 % IV SOLN
2.0000 g | Freq: Once | INTRAVENOUS | Status: AC
  Administered 2017-12-29: 2 g via INTRAVENOUS
  Filled 2017-12-29: qty 2

## 2017-12-29 MED ORDER — SODIUM CHLORIDE 0.9 % IV BOLUS (SEPSIS)
250.0000 mL | Freq: Once | INTRAVENOUS | Status: AC
  Administered 2017-12-29: 250 mL via INTRAVENOUS

## 2017-12-29 MED ORDER — SODIUM CHLORIDE 0.9 % IV SOLN
2.0000 g | Freq: Three times a day (TID) | INTRAVENOUS | Status: DC
Start: 2017-12-30 — End: 2018-01-02
  Administered 2017-12-30 – 2018-01-02 (×11): 2 g via INTRAVENOUS
  Filled 2017-12-29 (×14): qty 2

## 2017-12-29 MED ORDER — PROMETHAZINE HCL 25 MG/ML IJ SOLN
12.5000 mg | Freq: Once | INTRAMUSCULAR | Status: AC
  Administered 2017-12-29: 12.5 mg via INTRAVENOUS

## 2017-12-29 MED ORDER — VANCOMYCIN HCL IN DEXTROSE 1-5 GM/200ML-% IV SOLN
1000.0000 mg | Freq: Once | INTRAVENOUS | Status: AC
  Administered 2017-12-29: 1000 mg via INTRAVENOUS
  Filled 2017-12-29: qty 200

## 2017-12-29 MED ORDER — METRONIDAZOLE IN NACL 5-0.79 MG/ML-% IV SOLN
500.0000 mg | Freq: Three times a day (TID) | INTRAVENOUS | Status: DC
  Administered 2017-12-30 – 2018-01-02 (×11): 500 mg via INTRAVENOUS
  Filled 2017-12-29 (×14): qty 100

## 2017-12-29 NOTE — ED Notes (Signed)
ED Provider at bedside. 

## 2017-12-29 NOTE — ED Triage Notes (Signed)
Pt reports that he was given antibiotics last week when he had his R fourth and fifth toe amputation.  Pt is A&Ox4, in NAD.

## 2017-12-29 NOTE — ED Provider Notes (Signed)
[facility] Emergency Department Provider Note  ____________________________________________  Time seen: Approximately 8:37 PM  I have reviewed the triage vital signs and the nursing notes.   HISTORY  Chief Complaint Post-op Problem   HPI Shawn [name] is a 32 y.o. 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  Patient currently has a wound VAC.  Has had severe sharp pain that is constant and has not improved since his last admission, located on his R foot and worse with weight bearing.  2 days ago he was taken off of IV vancomycin and started on p.o. doxycycline.  Today he started having severe nausea.  He went to see his primary care doctor and labs were done showing worsening WBC and patient was sent here for concerns of worsening infection.  Patient reports that the wound VAC was changed today by home health nurse.  He is not sure if the wound looks any worse.  Past Medical History:  Diagnosis Date  . Diabetes mellitus without complication ([place])   . GERD (gastroesophageal reflux disease)   . IBS (irritable bowel syndrome)     Patient Active Problem List   Diagnosis Date Noted  . Sepsis ([place]) 12/18/2017  . Moderate recurrent major depression ([place]) 11/10/2017  . Type 2 diabetes mellitus with hyperosmolar nonketotic hyperglycemia ([place]) 11/09/2017  . History of migraine 07/15/2017  . IBS (irritable bowel syndrome) 07/15/2017  . Protein-calorie malnutrition, severe 07/14/2017  . Abdominal pain 07/13/2017  . GERD (gastroesophageal reflux disease) 12/30/2009  . Diabetes ([place]) 11/25/2009  . MDD (major depressive disorder) 11/25/2009  . Hypercholesterolemia 03/07/2008    Past Surgical History:  Procedure Laterality Date  . ABDOMINAL AORTOGRAM W/LOWER EXTREMITY  Right 12/23/2017   Procedure: ABDOMINAL AORTOGRAM W/LOWER EXTREMITY;  Surgeon: Katha Cabal, MD;  Location: [place] [facility];  Service: Cardiovascular;  Laterality: Right;  . AMPUTATION Right 12/24/2017   Procedure: AMPUTATION RAY;  Surgeon: Sharlotte Alamo, DPM;  Location: [facility] ORS;  Service: Podiatry;  Laterality: Right;  . APPLICATION OF WOUND VAC Right 12/24/2017   Procedure: APPLICATION OF WOUND VAC;  Surgeon: Sharlotte Alamo, DPM;  Location: [facility] OR[facility];  Service: Podiatry;  Laterality: Right;  . IRRIGATION AND DEBRIDEMENT FOOT Right 12/20/2017   Procedure: IRRIGATION AND DEBRIDEMENT FOOT;  Surgeon: Sharlotte Alamo, DPM;  Location: [facility] OR[facility];  Service: Podiatry;  Laterality: Right;  . IRRIGATION AND DEBRIDEMENT FOOT Right 12/24/2017   Procedure: IRRIGATION AND DEBRIDEMENT FOOT;  Surgeon: Sharlotte Alamo, DPM;  Location: [facility] OR[facility];  Service: Podiatry;  Laterality: Right;    Prior to Admission medications   Medication Sig Start Date End Date Taking? Authorizing Provider  ALPRAZolam (XANAX) 0.25 MG tablet Take 1 tablet (0.25 mg total) by mouth 3 (three) times daily as needed for anxiety. 12/27/17   Loletha Grayer, MD  doxycycline (VIBRA-TABS) 100 MG tablet Take 1 tablet (100 mg total) by mouth 2 (two) times daily with a meal for 14 days. 12/26/17 01/09/18  Loletha Grayer, MD  feeding supplement, GLUCERNA SHAKE, (GLUCERNA SHAKE) LIQD Take 237 mLs by mouth 3 (three) times daily between meals. 12/27/17   Loletha Grayer, MD  ferrous sulfate 325 (65 FE) MG tablet Take 1 tablet (325 mg total) by mouth 2 (two) times daily with a meal. 12/26/17   Loletha Grayer, MD  FLUoxetine (PROZAC) 20 MG capsule Take 1 capsule (20 mg total) by mouth daily. 11/12/17   Gladstone Lighter, MD  insulin aspart (NOVOLOG) 100 UNIT/ML injection Inject 8 Units into the skin 3 (three) times daily with meals. 12/26/17   Loletha Grayer, MD  Insulin Syringes, Disposable, U-100 0.3 ML MISC 1 Syringe by Does not apply route 4 (four) times  daily -  with meals and at bedtime. 12/27/17   Loletha Grayer, MD  lisinopril (PRINIVIL,ZESTRIL) 10 MG tablet Take 1 tablet (10 mg total) by mouth daily. 11/12/17   Gladstone Lighter, MD  metoCLOPramide (REGLAN) 10 MG tablet Take 1 tablet (10 mg total) by mouth 3 (three) times daily before meals. 12/26/17   Loletha Grayer, MD  oxyCODONE-acetaminophen (PERCOCET/ROXICET) 5-325 MG tablet Take 1 tablet by mouth every 6 (six) hours as needed for moderate pain. 12/26/17   Loletha Grayer, MD  polyethylene glycol [facility]) packet Take 17 g by mouth daily as needed for moderate constipation. 12/26/17   Loletha Grayer, MD  promethazine (PHENERGAN) 12.5 MG tablet Take 1 tablet (12.5 mg total) by mouth every 6 (six) hours as needed for nausea or vomiting. 12/26/17   Loletha Grayer, MD    Allergies Banana; Keflex [cephalexin]; Onion; Sulfa antibiotics; Grapeseed extract [nutritional supplements]; and Shellfish allergy  Family History  Problem Relation Age of Onset  . Diabetes Mother   . Ovarian cancer Mother     Social History Social History   Tobacco Use  . Smoking status: Never Smoker  . Smokeless tobacco: Never Used  Substance Use Topics  . Alcohol use: No    Frequency: Never  . Drug use: Yes    Types: Marijuana    Review of Systems  Constitutional: Negative for fever. Eyes: Negative for visual changes. ENT: Negative for sore throat. Neck: No neck pain  Cardiovascular: Negative for chest pain. Respiratory: Negative for shortness of breath. Gastrointestinal: Negative for abdominal pain, vomiting or diarrhea. + nausea Genitourinary: Negative for dysuria. Musculoskeletal: Negative for back pain. + R foot pain Skin: Negative for rash. Neurological: Negative for headaches, weakness or numbness. Psych: No SI or HI  ____________________________________________   PHYSICAL EXAM:  VITAL SIGNS: ED Triage Vitals  Enc Vitals Group     BP 12/29/17 2001 107/64     Pulse Rate  12/29/17 2001 (!) 117     Resp 12/29/17 2001 20     Temp 12/29/17 2001 99.7 F (37.6 C)     Temp Source 12/29/17 2001 Oral     SpO2 12/29/17 2001 99 %     Weight 12/29/17 2002 115 lb (52.2 kg)     Height --      Head Circumference --      Peak Flow --      Pain Score 12/29/17 2002 10     Pain Loc --      Pain Edu? --      Excl. in [place]? --     Constitutional: Alert and oriented. Well appearing and in no apparent distress. HEENT:      Head: Normocephalic and atraumatic.         Eyes: Conjunctivae are normal. Sclera is non-icteric.       Mouth/Throat: Mucous membranes are moist.       Neck: Supple with no signs of meningismus. Cardiovascular: Tachycardic with regular rhythm. No murmurs, gallops, or rubs. 2+ symmetrical distal pulses are present in all extremities. No JVD. Respiratory: Normal respiratory effort. Lungs are clear to auscultation bilaterally. No wheezes, crackles,  or rhonchi.  Gastrointestinal: Soft, non tender, and non distended with positive bowel sounds. No rebound or guarding. Musculoskeletal: Wound vac placed over amputation site on the R foot with surrounding cellulitis involving the entire dorsum of the R foot. Strong DP and PT pulses, brisk capillary refill Neurologic: Normal speech and language. Face is symmetric. Moving all extremities. No gross focal neurologic deficits are appreciated. Skin: Skin is warm, dry and intact. No rash noted. Psychiatric: Mood and affect are normal. Speech and behavior are normal.  ____________________________________________   LABS (all labs ordered are listed, but only abnormal results are displayed)  Labs Reviewed  COMPREHENSIVE METABOLIC PANEL - Abnormal; Notable for the following components:      Result Value   Potassium 3.4 (*)    Glucose, Bld 425 (*)    BUN 26 (*)    Creatinine, Ser 1.34 (*)    Calcium 8.1 (*)    Total Protein 6.4 (*)    Albumin 2.3 (*)    All other components within normal limits  CBC WITH  DIFFERENTIAL/PLATELET - Abnormal; Notable for the following components:   WBC 15.8 (*)    RBC 2.70 (*)    Hemoglobin 7.7 (*)    HCT 23.4 (*)    RDW 14.8 (*)    Platelets 609 (*)    Neutro Abs 11.9 (*)    Monocytes Absolute 1.3 (*)    All other components within normal limits  CULTURE, BLOOD (ROUTINE X 2)  CULTURE, BLOOD (ROUTINE X 2)  LACTIC ACID, PLASMA   ____________________________________________  EKG  none  ____________________________________________  RADIOLOGY  I have personally reviewed the images performed during this visit and I agree with the Radiologist's read.   Interpretation by Radiologist:  Dg Foot Complete Right  Result Date: 12/29/2017 CLINICAL DATA:  Foot amputation.  Possible infection. EXAM: RIGHT FOOT COMPLETE - 3+ VIEW COMPARISON:  Foot MRI, 12/19/2017. Right foot radiographs, 12/18/2017. 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 The amputated margins of the remaining fourth and fifth metatarsals are well demarcated with no focal bone resorption to suggest osteomyelitis. Elsewhere, there are no areas of bone resorption to suggest osteomyelitis. Joints are normally aligned. There is no soft tissue air. IMPRESSION: 1. No radiographic evidence of osteomyelitis.  No soft tissue air. 2. 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 Electronically Signed   By: Lajean Manes M.D.   On: 12/29/2017 21:02      ____________________________________________   PROCEDURES  Procedure(s) performed: None Procedures Critical Care performed: yes  CRITICAL CARE Performed by: Rudene Re  ?  Total critical care time: 30 min  Critical care time was exclusive of separately billable procedures and treating other patients.  Critical care was  necessary to treat or prevent imminent or life-threatening deterioration.  Critical care was time spent personally by me on the following activities: development of treatment plan with patient and/or surrogate as well as nursing, discussions with consultants, evaluation of patient's response to treatment, examination of patient, obtaining history from patient or surrogate, ordering and performing treatments and interventions, ordering and review of laboratory studies, ordering and review of radiographic studies, pulse oximetry and re-evaluation of patient's condition.  ____________________________________________   INITIAL IMPRESSION / ASSESSMENT AND PLAN / ED COURSE  32 y.o. 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  Patient arrives in significant amount of pain, is tachycardic with a pulse of 117 and has a low-grade temp of 99.7.  He has a wound VAC located in the right foot with surrounding cellulitis that includes the entire dorsum of the right foot.  At this time presentation is concerning for sepsis.  Patient be started on broad-spectrum antibiotics, IV fluids, IV Dilaudid for pain.  Labs are pending. XR pending.     _________________________ 9:39 PM on 12/29/2017 -----------------------------------------  Labs show leukocytosis with white count of 15.8, stable hemoglobin is 7.7, thrombocytosis most likely due to sepsis, hyperglycemia with no evidence of DKA.  X-ray showing no evidence of osteomyelitis. Normal lactic. Patient will be admitted.    As part of my medical decision making, I reviewed the following data within the [place] notes reviewed and incorporated, Labs reviewed , Old chart reviewed, Radiograph reviewed , Discussed with admitting physician , Notes from  prior ED visits and [place] Controlled Substance Database    Pertinent labs & imaging results that were available during my care of the patient were reviewed by me and considered in my medical decision making (see chart for details).    ____________________________________________   FINAL CLINICAL IMPRESSION(S) / ED DIAGNOSES  Final diagnoses:  Sepsis, due to unspecified organism ([place])  Cellulitis of right lower extremity  Hyperglycemia      NEW MEDICATIONS STARTED DURING THIS VISIT:  ED Discharge Orders    None       Note:  This document was prepared using Dragon voice recognition software and may include unintentional dictation errors.    Alfred Levins, [place], MD 12/29/17 2140

## 2017-12-29 NOTE — Consult Note (Signed)
Pharmacy Antibiotic Note  Shawn [name] is a 32 y.o. male admitted on 12/29/2017 with worsening right foot infection. 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    Pharmacy has been consulted for Vancomycin, aztreonam, and metronidazole dosing.   Plan: Ke: 0.054   T1/2: 12.83   VD: 36.5 Patient received vancomycin 1g IV x 1 dose.   Start Vancomycin 750 IV every 18 hours with 10 hour stack dosing.  Goal trough 15-20 mcg/mL. Calculated trough at Css is 15.2. Trough level ordered prior to 4th dose.   Start Aztreonam 2g IV every 8 hours.   Start Metronidazole 500mg  IV every 8 hours.   Weight: 115 lb (52.2 kg)  Temp (24hrs), Avg:99.7 F (37.6 C), Min:99.7 F (37.6 C), Max:99.7 F (37.6 C)  Recent Labs  Lab 12/23/17 1847 12/24/17 0351 12/24/17 1739 12/25/17 0423 12/26/17 0441 12/27/17 0425 12/29/17 2039  WBC  --  10.9*  --  12.1*  --   --  15.8*  CREATININE  --   --  1.40*  --  1.27* 1.08 1.34*  LATICACIDVEN  --   --   --   --   --   --  1.8  VANCOTROUGH 19  --   --   --  21*  --   --     Estimated Creatinine Clearance: 59.5 mL/min (A) (by C-G formula based on SCr of 1.34 mg/dL (H)).    Allergies  Allergen Reactions  . Banana Hives, Nausea And Vomiting and Rash  . Keflex [Cephalexin] Anaphylaxis  . Onion Hives, Nausea And Vomiting and Rash  . Sulfa Antibiotics Anaphylaxis  . Grapeseed Extract [Nutritional Supplements] Itching  . Shellfish Allergy Hives    "ALL SEAFOOD"    Antimicrobials this admission: 6/27 Aztreonam >>  6/27 Vancomycin >>  6/27 Metronidazole>>   Dose adjustments this admission:  Microbiology results: 6/27 BCx: pending  6/27 MRSA PCR: pending   Thank you for allowing pharmacy to be a part of this patient's care.  Pernell Dupre, PharmD, BCPS Clinical Pharmacist 12/29/2017 9:53 PM

## 2017-12-29 NOTE — Progress Notes (Signed)
CODE SEPSIS - PHARMACY COMMUNICATION  **Broad Spectrum Antibiotics should be administered within 1 hour of Sepsis diagnosis**  Time Code Sepsis Called/Page Received: 2033  Antibiotics Ordered: Vancomycin                                      Aztreonam                                      Metronidazole   Time of 1st antibiotic administration: 2105  Additional action taken by pharmacy: N/A  If necessary, Name of Provider/Nurse Contacted: N/A  Pernell Dupre, PharmD, BCPS Clinical Pharmacist 12/29/2017 9:37 PM

## 2017-12-30 ENCOUNTER — Other Ambulatory Visit: Payer: Self-pay

## 2017-12-30 LAB — GLUCOSE, CAPILLARY
Glucose-Capillary: 131 mg/dL — ABNORMAL HIGH (ref 70–99)
Glucose-Capillary: 55 mg/dL — ABNORMAL LOW (ref 70–99)
Glucose-Capillary: 57 mg/dL — ABNORMAL LOW (ref 70–99)
Glucose-Capillary: 76 mg/dL (ref 70–99)
Glucose-Capillary: 76 mg/dL (ref 70–99)
Glucose-Capillary: 91 mg/dL (ref 70–99)

## 2017-12-30 LAB — TSH: TSH: 1.502 u[IU]/mL (ref 0.350–4.500)

## 2017-12-30 MED ORDER — INSULIN GLARGINE 100 UNIT/ML ~~LOC~~ SOLN
40.0000 [IU] | Freq: Every day | SUBCUTANEOUS | Status: DC
  Administered 2017-12-30: 40 [IU] via SUBCUTANEOUS
  Filled 2017-12-30 (×2): qty 0.4

## 2017-12-30 MED ORDER — FERROUS SULFATE 325 (65 FE) MG PO TABS
325.0000 mg | ORAL_TABLET | Freq: Two times a day (BID) | ORAL | Status: DC
  Administered 2017-12-30 – 2018-01-02 (×7): 325 mg via ORAL
  Filled 2017-12-30 (×7): qty 1

## 2017-12-30 MED ORDER — VANCOMYCIN HCL IN DEXTROSE 750-5 MG/150ML-% IV SOLN
750.0000 mg | Freq: Two times a day (BID) | INTRAVENOUS | Status: DC
  Administered 2017-12-30 – 2018-01-02 (×6): 750 mg via INTRAVENOUS
  Filled 2017-12-30 (×7): qty 150

## 2017-12-30 MED ORDER — INSULIN ASPART 100 UNIT/ML ~~LOC~~ SOLN
0.0000 [IU] | Freq: Three times a day (TID) | SUBCUTANEOUS | Status: DC
  Administered 2018-01-01: 5 [IU] via SUBCUTANEOUS
  Administered 2018-01-01: 15 [IU] via SUBCUTANEOUS
  Administered 2018-01-02: 3 [IU] via SUBCUTANEOUS
  Administered 2018-01-02: 5 [IU] via SUBCUTANEOUS
  Administered 2018-01-02: 2 [IU] via SUBCUTANEOUS
  Filled 2017-12-30 (×5): qty 1

## 2017-12-30 MED ORDER — FLUOXETINE HCL 20 MG PO CAPS
20.0000 mg | ORAL_CAPSULE | Freq: Every day | ORAL | Status: DC
  Administered 2017-12-30 – 2018-01-03 (×4): 20 mg via ORAL
  Filled 2017-12-30 (×5): qty 1

## 2017-12-30 MED ORDER — ALPRAZOLAM 0.25 MG PO TABS
0.2500 mg | ORAL_TABLET | Freq: Three times a day (TID) | ORAL | Status: DC | PRN
  Administered 2017-12-30 – 2018-01-03 (×10): 0.25 mg via ORAL
  Filled 2017-12-30 (×10): qty 1

## 2017-12-30 MED ORDER — ONDANSETRON HCL 4 MG PO TABS: 4.0000 mg | ORAL_TABLET | Freq: Four times a day (QID) | ORAL | Status: DC | PRN

## 2017-12-30 MED ORDER — DOCUSATE SODIUM 100 MG PO CAPS
100.0000 mg | ORAL_CAPSULE | Freq: Two times a day (BID) | ORAL | Status: DC
  Administered 2018-01-01 – 2018-01-03 (×3): 100 mg via ORAL
  Filled 2017-12-30 (×8): qty 1

## 2017-12-30 MED ORDER — GLUCERNA SHAKE PO LIQD
237.0000 mL | Freq: Three times a day (TID) | ORAL | Status: DC
  Administered 2017-12-30 – 2018-01-03 (×3): 237 mL via ORAL

## 2017-12-30 MED ORDER — OXYCODONE HCL ER 10 MG PO T12A
10.0000 mg | EXTENDED_RELEASE_TABLET | Freq: Two times a day (BID) | ORAL | Status: DC
  Administered 2017-12-30 – 2018-01-03 (×9): 10 mg via ORAL
  Filled 2017-12-30 (×9): qty 1

## 2017-12-30 MED ORDER — SODIUM CHLORIDE 0.9 % IV SOLN
INTRAVENOUS | Status: DC
  Administered 2017-12-30 – 2017-12-31 (×4): via INTRAVENOUS

## 2017-12-30 MED ORDER — OXYCODONE-ACETAMINOPHEN 5-325 MG PO TABS
1.0000 | ORAL_TABLET | ORAL | Status: DC | PRN
  Administered 2017-12-30 – 2018-01-03 (×15): 1 via ORAL
  Filled 2017-12-30 (×15): qty 1

## 2017-12-30 MED ORDER — OXYCODONE-ACETAMINOPHEN 5-325 MG PO TABS
1.0000 | ORAL_TABLET | Freq: Four times a day (QID) | ORAL | Status: DC | PRN
  Administered 2017-12-30 (×2): 1 via ORAL
  Filled 2017-12-30 (×2): qty 1

## 2017-12-30 MED ORDER — HEPARIN SODIUM (PORCINE) 5000 UNIT/ML IJ SOLN
5000.0000 [IU] | Freq: Three times a day (TID) | INTRAMUSCULAR | Status: DC
  Administered 2017-12-30 – 2018-01-03 (×12): 5000 [IU] via SUBCUTANEOUS
  Filled 2017-12-30 (×12): qty 1

## 2017-12-30 MED ORDER — POTASSIUM CHLORIDE CRYS ER 20 MEQ PO TBCR
40.0000 meq | EXTENDED_RELEASE_TABLET | Freq: Once | ORAL | Status: AC
  Administered 2017-12-30: 40 meq via ORAL
  Filled 2017-12-30: qty 2

## 2017-12-30 MED ORDER — POLYETHYLENE GLYCOL 3350 17 G PO PACK: 17.0000 g | PACK | Freq: Every day | ORAL | Status: DC | PRN

## 2017-12-30 MED ORDER — KETOROLAC TROMETHAMINE 15 MG/ML IJ SOLN
15.0000 mg | Freq: Once | INTRAMUSCULAR | Status: AC
  Administered 2017-12-30: 15 mg via INTRAVENOUS
  Filled 2017-12-30: qty 1

## 2017-12-30 MED ORDER — INSULIN GLARGINE 100 UNIT/ML ~~LOC~~ SOLN
30.0000 [IU] | Freq: Every day | SUBCUTANEOUS | Status: DC
  Administered 2017-12-30 – 2018-01-01 (×2): 30 [IU] via SUBCUTANEOUS
  Filled 2017-12-30 (×4): qty 0.3

## 2017-12-30 MED ORDER — ACETAMINOPHEN 325 MG PO TABS: 650.0000 mg | ORAL_TABLET | Freq: Four times a day (QID) | ORAL | Status: DC | PRN

## 2017-12-30 MED ORDER — METOCLOPRAMIDE HCL 10 MG PO TABS
10.0000 mg | ORAL_TABLET | Freq: Three times a day (TID) | ORAL | Status: DC
  Administered 2017-12-30 – 2018-01-03 (×13): 10 mg via ORAL
  Filled 2017-12-30 (×13): qty 1

## 2017-12-30 MED ORDER — LISINOPRIL 10 MG PO TABS
10.0000 mg | ORAL_TABLET | Freq: Every day | ORAL | Status: DC
  Administered 2017-12-30 – 2018-01-03 (×5): 10 mg via ORAL
  Filled 2017-12-30 (×5): qty 1

## 2017-12-30 MED ORDER — ACETAMINOPHEN 650 MG RE SUPP: 650.0000 mg | Freq: Four times a day (QID) | RECTAL | Status: DC | PRN

## 2017-12-30 MED ORDER — PROMETHAZINE HCL 25 MG/ML IJ SOLN
6.2500 mg | Freq: Four times a day (QID) | INTRAMUSCULAR | Status: DC | PRN
  Administered 2017-12-30 – 2018-01-01 (×6): 6.25 mg via INTRAVENOUS
  Filled 2017-12-30 (×6): qty 1

## 2017-12-30 MED ORDER — ONDANSETRON HCL 4 MG/2ML IJ SOLN
4.0000 mg | Freq: Four times a day (QID) | INTRAMUSCULAR | Status: DC | PRN
  Administered 2017-12-31 – 2018-01-02 (×2): 4 mg via INTRAVENOUS
  Filled 2017-12-30 (×2): qty 2

## 2017-12-30 NOTE — Progress Notes (Signed)
[facility] [place] NAME: Shawn [name]    MR#:  [id number]  DATE OF BIRTH:  September 23, 1985  SUBJECTIVE:  CHIEF COMPLAINT:   Chief Complaint  Patient presents with  . Post-op Problem   Recent discharge after toe amputation by podiatry on doxy. Sent back by PMD due  To high WBCs and persistent pain. Still had significant pain today  REVIEW OF SYSTEMS:  CONSTITUTIONAL: No fever, fatigue or weakness.  EYES: No blurred or double vision.  EARS, NOSE, AND THROAT: No tinnitus or ear pain.  RESPIRATORY: No cough, shortness of breath, wheezing or hemoptysis.  CARDIOVASCULAR: No chest pain, orthopnea, edema.  GASTROINTESTINAL: No nausea, vomiting, diarrhea or abdominal pain.  GENITOURINARY: No dysuria, hematuria.  ENDOCRINE: No polyuria, nocturia,  HEMATOLOGY: No anemia, easy bruising or bleeding SKIN: No rash or lesion. MUSCULOSKELETAL: No joint pain or arthritis. Right foot pain.  NEUROLOGIC: No tingling, numbness, weakness.  PSYCHIATRY: No anxiety or depression.   ROS  DRUG ALLERGIES:   Allergies  Allergen Reactions  . Banana Hives, Nausea And Vomiting and Rash  . Keflex [Cephalexin] Anaphylaxis  . Onion Hives, Nausea And Vomiting and Rash  . Sulfa Antibiotics Anaphylaxis  . Grapeseed Extract [Nutritional Supplements] Itching  . Shellfish Allergy Hives    "ALL SEAFOOD"    VITALS:  Blood pressure 118/76, pulse 97, temperature 98.1 F (36.7 C), temperature source Oral, resp. rate 16, height 5\' 6"  (1.676 m), weight 61.7 kg (136 lb), SpO2 97 %.  PHYSICAL EXAMINATION:  GENERAL:  32 y.o.-year-old patient lying in the bed with no acute distress.  EYES: Pupils equal, round, reactive to light and accommodation. No scleral icterus. Extraocular muscles intact.  HEENT: Head atraumatic, normocephalic. Oropharynx and nasopharynx clear.  NECK:  Supple, no jugular venous distention. No thyroid enlargement, no tenderness.  LUNGS: Normal breath  sounds bilaterally, no wheezing, rales,rhonchi or crepitation. No use of accessory muscles of respiration.  CARDIOVASCULAR: S1, S2 tachycardia. No murmurs, rubs, or gallops.  ABDOMEN: Soft, nontender, nondistended. Bowel sounds present. No organomegaly or mass.  EXTREMITIES: No pedal edema, cyanosis, or clubbing. Dressing on right foot. NEUROLOGIC: Cranial nerves II through XII are intact. Muscle strength 5/5 in all extremities. Sensation intact. Gait not checked.  PSYCHIATRIC: The patient is alert and oriented x 3.  SKIN: No obvious rash, lesion, or ulcer.   Physical Exam LABORATORY PANEL:   CBC Recent Labs  Lab 12/29/17 2039  WBC 15.8*  HGB 7.7*  HCT 23.4*  PLT 609*   ------------------------------------------------------------------------------------------------------------------  Chemistries  Recent Labs  Lab 12/29/17 2039  NA 136  K 3.4*  CL 103  CO2 23  GLUCOSE 425*  BUN 26*  CREATININE 1.34*  CALCIUM 8.1*  AST 23  ALT 20  ALKPHOS 90  BILITOT 0.3   ------------------------------------------------------------------------------------------------------------------  Cardiac Enzymes No results for input(s): TROPONINI in the last 168 hours. ------------------------------------------------------------------------------------------------------------------  RADIOLOGY:  Dg Foot Complete Right  Result Date: 12/29/2017 CLINICAL DATA:  Foot amputation.  Possible infection. EXAM: RIGHT FOOT COMPLETE - 3+ VIEW COMPARISON:  Foot MRI, 12/19/2017. Right foot radiographs, 12/18/2017. 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 The amputated margins of the remaining fourth and fifth metatarsals are well demarcated with no focal bone resorption to suggest osteomyelitis. Elsewhere, there are no areas of bone resorption to  suggest osteomyelitis. Joints  are normally aligned. There is no soft tissue air. IMPRESSION: 1. No radiographic evidence of osteomyelitis.  No soft tissue air. 2. 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 Electronically Signed   By: Lajean Manes M.D.   On: 12/29/2017 21:02    ASSESSMENT AND PLAN:   Active Problems:   Cellulitis  This is a 32 year old male admitted for cellulitis. 1.  Cellulitis: Wound VAC in place.  Obtain wound consult.    on aztreonam as his previous wound cultures have shown strep agalactiae with MSSA as well.    adding anaerobic coverage as well.   Called Podiatry consult- plan for debbridement tomorrow. 2.  Sepsis: The patient meets criteria via leukocytosis and tachycardia.  He is hemodynamically stable.  Follow blood cultures for growth and sensitivities. 3.  Hypertension: Controlled; continue lisinopril 4.  Diabetes mellitus type 1: Continue basal insulin therapy adjusted for hospital diet.  Also sliding scale insulin.   5.  DVT prophylaxis: Heparin 6.  GI prophylaxis: None  All the records are reviewed and case discussed with Care Management/Social Workerr. Management plans discussed with the patient, family and they are in agreement.  CODE STATUS: Full.  TOTAL TIME TAKING CARE OF THIS PATIENT: 35 minutes.     POSSIBLE D/C IN 1-2 DAYS, DEPENDING ON CLINICAL CONDITION.   Vaughan Basta M.D on 12/30/2017   Between 7am to 6pm - Pager - [phone]  After 6pm go to [email or web address] - password [email or web address] [facility] [place] [facility]  Office  [phone]  CC: Primary care physician; Valera Castle, MD  Note: This dictation was prepared with Dragon dictation along with smaller phrase technology. Any transcriptional errors that result from this process are unintentional.

## 2017-12-30 NOTE — Progress Notes (Signed)
Wound care completed by podiatrist today at this time. Will continue to monitor wound dressing status. Wenda Low [facility]

## 2017-12-30 NOTE — Progress Notes (Addendum)
Inpatient Diabetes Program Recommendations  AACE/ADA: New Consensus Statement on Inpatient Glycemic Control (2015)  Target Ranges:  Prepandial:   less than 140 mg/dL      Peak postprandial:   less than 180 mg/dL (1-2 hours)      Critically ill patients:  140 - 180 mg/dL   Results for Shawn [name], Shawn [name] (MRN [id number]) as of 12/30/2017 09:23  Ref. Range 12/29/2017 21:57 12/30/2017 00:27 12/30/2017 08:00 12/30/2017 08:27 12/30/2017 09:13  Glucose-Capillary Latest Ref Range: 70 - 99 mg/dL 270 (H)  5 units NOVOLOG +  33 units LANTUS  131 (H)     40 units LANTUS 55 (L) 57 (L) 91     Admit with: Cellulitis Foot/ Sepsis  History: Type 1 DM  Home DM Meds: Lantus 30 units daily       Regular 8 units TID  Current Insulin Orders: Lantus 40 units QHS      Novolog Moderate Correction Scale/ SSI (0-15 units) TID AC       Patient with Hypoglycemia this AM likely due to too much Lantus being given last PM.  Patient received 33 units Lantus at 10pm and then got another 40 units Lantus at 1am.  Not sure why patient received two doses back to back?  CBG down to 55 mg/dl this AM.    MD- May want to reduce Lantus to 30 units QHS for tonight (during hospitalization earlier in the week, patient was only taking Lantus 30 units daily)      --Will follow patient during hospitalization--  Wyn Quaker RN, MSN, CDE Diabetes Coordinator Inpatient Glycemic Control Team Team Pager: [phone] (8a-5p)

## 2017-12-30 NOTE — Care Management (Addendum)
Patient discharged from [facility] 6/25 with wound vac.  CM made patient and appointment to get reestablished with his PCP and this appointment was on 6/27.  The PCP sent to ED due to concerns for worsening infection.  Elevated WBC 15.8. Readmitted. [facility] is providing wound vac and nursing.  Home health nurse . [facility] is assisting patient with the monthly copay for the wound vac. Patient's A1C is extremely elevated.  Discussed monitoring glycemic status with [facility]

## 2017-12-30 NOTE — Consult Note (Signed)
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 Cleda Mccreedy (Podiatry) on 12/20/17. Had been seen preoperatively by Dr. Delana Meyer (Vascular) to determine adequacy of perfusion.  Wound type: Neuropathic, traumatic, surgical Pressure Injury POA: NA Measurement: 7.5cm x 3.5cm. I am unable to determine true depth due tot he presence of necrotic tissue in the wound bed, but it probes to 5cm at this time. There is a periwound extension of the wound measuring 0.4cm x 3cm with necrotic wound bed on the plantar aspect of the foot across the metatarsal heads. Patient is experiencing a high level of discomfort despite being recently medicated for pain with an oral analgesic and does not tolerate wound exploration. There are a few remnants from the NPWT foam as well as necrotic tissue in the wound bed.  Consider alternatives to PO pain medication and if you agree, please order. Wound bed: As described above Drainage (amount, consistency, odor) None noted Periwound: maceration and skin peeling; small area of medical adhesive related skin injury (MARSI) on the anterior foot measuring 0.6cm round with brown discoloration and scant amount of serous exudate. Dressing procedure/placement/frequency: I will implement Nursing guidance for three times daily saline moistened gauze dressings and elevation so that frequent monitoring of the wound can be accomplished in the presence of infection and wound deterioriation.  Please notify Podiatry (Dr. Cleda Mccreedy, if available) of patient's admission and reconsult if you agree.   NPWT device is discontinued, cannister and tubing discarded and device placed in patient belonging bag for transport home.   [place] nursing team will not follow, but will remain available to this patient, the nursing and medical teams.  Please re-consult if needed. Thanks, Maudie Flakes, MSN, RN,  [place], Arther Abbott  Pager# [phone]

## 2017-12-30 NOTE — ED Notes (Signed)
Pt transported to rm [id number]

## 2017-12-30 NOTE — Consult Note (Signed)
Pharmacy Antibiotic Note  Shawn [name] is a 32 y.o. male admitted on 12/29/2017 with worsening right foot infection. 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    Pharmacy has been consulted for Vancomycin, aztreonam, and metronidazole dosing.   Plan: Ke: 0.054   T1/2: 12.83   VD: 36.5 Patient received vancomycin 1g IV x 1 dose.   Start Vancomycin 750 IV every 18 hours with 10 hour stack dosing.  Goal trough 15-20 mcg/mL. Calculated trough at Css is 15.2. Trough level ordered prior to 4th dose.   6/28: Increase in weight. Ke 0.063, half life 11 h, Vd 43.2 L Will increase dose to vancomycin 750 mg IV q12h for Css of 15.8. Trough before 4th dose of regimen. CMP ordered by MD for tomorrow to assess renal function.   Continue Aztreonam 2g IV every 8 hours.   Continue Metronidazole 500 mg IV every 8 hours.    Height: 5\' 6"  (167.6 cm) Weight: 136 lb (61.7 kg) IBW/kg (Calculated) : 63.8  Temp (24hrs), Avg:98.4 F (36.9 C), Min:97.6 F (36.4 C), Max:99.7 F (37.6 C)  Recent Labs  Lab 12/23/17 1847 12/24/17 0351 12/24/17 1739 12/25/17 0423 12/26/17 0441 12/27/17 0425 12/29/17 2039  WBC  --  10.9*  --  12.1*  --   --  15.8*  CREATININE  --   --  1.40*  --  1.27* 1.08 1.34*  LATICACIDVEN  --   --   --   --   --   --  1.8  VANCOTROUGH 19  --   --   --  21*  --   --     Estimated Creatinine Clearance: 70.3 mL/min (A) (by C-G formula based on SCr of 1.34 mg/dL (H)).    Allergies  Allergen Reactions  . Banana Hives, Nausea And Vomiting and Rash  . Keflex [Cephalexin] Anaphylaxis  . Onion Hives, Nausea And Vomiting and Rash  . Sulfa Antibiotics Anaphylaxis  . Grapeseed Extract [Nutritional Supplements] Itching  . Shellfish Allergy Hives    "ALL SEAFOOD"    Antimicrobials this admission: 6/27 Aztreonam >>  6/27 Vancomycin >>  6/27 Metronidazole>>   Dose adjustments this  admission:  Microbiology results: 6/27 BCx: NGTD   Thank you for allowing pharmacy to be a part of this patient's care.  Rocky Morel, PharmD, BCPS Clinical Pharmacist 12/30/2017 11:30 AM

## 2017-12-30 NOTE — Consult Note (Signed)
[facility]                                                      Patient Demographics  Shawn [name], is a 32 y.o. male   MRN: [id number]   DOB - 03-16-1986  Admit Date - 12/29/2017    Outpatient Primary MD for the patient is Olmedo, Guy Begin, MD  Consult requested in the [facility] by Vaughan Basta, *, On 12/30/2017    Reason for consult nonhealing wound right foot   With History of -  Past Medical History:  Diagnosis Date  . Diabetes mellitus without complication ([place])   . GERD (gastroesophageal reflux disease)   . IBS (irritable bowel syndrome)       Past Surgical History:  Procedure Laterality Date  . ABDOMINAL AORTOGRAM W/LOWER EXTREMITY Right 12/23/2017   Procedure: ABDOMINAL AORTOGRAM W/LOWER EXTREMITY;  Surgeon: Katha Cabal, MD;  Location: [place] [facility];  Service: Cardiovascular;  Laterality: Right;  . AMPUTATION Right 12/24/2017   Procedure: AMPUTATION RAY;  Surgeon: Sharlotte Alamo, DPM;  Location: [facility] ORS;  Service: Podiatry;  Laterality: Right;  . APPLICATION OF WOUND VAC Right 12/24/2017   Procedure: APPLICATION OF WOUND VAC;  Surgeon: Sharlotte Alamo, DPM;  Location: [facility] ORS;  Service: Podiatry;  Laterality: Right;  . IRRIGATION AND DEBRIDEMENT FOOT Right 12/20/2017   Procedure: IRRIGATION AND DEBRIDEMENT FOOT;  Surgeon: Sharlotte Alamo, DPM;  Location: [facility] ORS;  Service: Podiatry;  Laterality: Right;  . IRRIGATION AND DEBRIDEMENT FOOT Right 12/24/2017   Procedure: IRRIGATION AND DEBRIDEMENT FOOT;  Surgeon: Sharlotte Alamo, DPM;  Location: [facility] ORS;  Service: Podiatry;  Laterality: Right;    in for   Chief Complaint  Patient presents with  . Post-op Problem     HPI  Shawn [name]  is a 32 y.o. male, patient underwent debridement of the right lateral foot including a fourth fifth ray amputations last weekend by Dr.  Deatra Ina.  He underwent a catheterization last week as well from Dr. Delana Meyer and findings indicated severe circulatory compromise to the lateral portion of his foot.  He had good flow to the medial portion but not the lateral portion.  Dr. Caryl Comes debrided him removed as much necrotic tissues he could and put him in a wound VAC.  He returns today to the hospital and was admitted last night because of some increase in his white blood cell count.  Concerns were for cellulitis to the foot.  The wound VAC was changed for the first time yesterday.    Social History Social History   Tobacco Use  . Smoking status: Never Smoker  . Smokeless tobacco: Never Used  Substance Use Topics  . Alcohol use: No    Frequency: Never    Family History Family History  Problem Relation Age of Onset  . Diabetes Mother   . Ovarian cancer Mother     Prior to Admission medications   Medication Sig Start Date End Date Taking? Authorizing Provider  ALPRAZolam (XANAX) 0.25 MG tablet Take 1 tablet (0.25 mg total) by mouth 3 (three) times daily as needed for anxiety. 12/27/17  Yes Wieting, Richard, MD  doxycycline (VIBRA-TABS) 100 MG tablet Take 1 tablet (100 mg total) by mouth 2 (two) times daily with a meal for 14 days. 12/26/17 01/09/18 Yes Loletha Grayer, MD  FLUoxetine (  PROZAC) 20 MG capsule Take 1 capsule (20 mg total) by mouth daily. 11/12/17  Yes Gladstone Lighter, MD  insulin glargine (LANTUS) 100 UNIT/ML injection Inject 33 Units into the skin 2 (two) times daily.   Yes [provider]  lisinopril (PRINIVIL,ZESTRIL) 10 MG tablet Take 1 tablet (10 mg total) by mouth daily. 11/12/17  Yes Gladstone Lighter, MD  NOVOLIN R RELION 100 UNIT/ML injection Inject 8 Units into the skin 3 (three) times daily with meals. 12/27/17  Yes [provider]  oxyCODONE-acetaminophen (PERCOCET/ROXICET) 5-325 MG tablet Take 1 tablet by mouth every 6 (six) hours as needed for moderate pain. 12/26/17  Yes Loletha Grayer,  MD  promethazine (PHENERGAN) 12.5 MG tablet Take 1 tablet (12.5 mg total) by mouth every 6 (six) hours as needed for nausea or vomiting. 12/26/17  Yes Wieting, Richard, MD  feeding supplement, GLUCERNA SHAKE, (GLUCERNA SHAKE) LIQD Take 237 mLs by mouth 3 (three) times daily between meals. 12/27/17   Loletha Grayer, MD  ferrous sulfate 325 (65 FE) MG tablet Take 1 tablet (325 mg total) by mouth 2 (two) times daily with a meal. 12/26/17   Leslye Peer, Richard, MD  insulin aspart (NOVOLOG) 100 UNIT/ML injection Inject 8 Units into the skin 3 (three) times daily with meals. Patient not taking: Reported on 12/29/2017 12/26/17   Loletha Grayer, MD  Insulin Syringes, Disposable, U-100 0.3 ML MISC 1 Syringe by Does not apply route 4 (four) times daily -  with meals and at bedtime. 12/27/17   Loletha Grayer, MD  metoCLOPramide (REGLAN) 10 MG tablet Take 1 tablet (10 mg total) by mouth 3 (three) times daily before meals. 12/26/17   Loletha Grayer, MD  polyethylene glycol [facility]) packet Take 17 g by mouth daily as needed for moderate constipation. 12/26/17   Loletha Grayer, MD    Anti-infectives (From admission, onward)   Start     Dose/Rate Route Frequency Ordered Stop   12/30/17 0800  vancomycin (VANCOCIN) IVPB 750 mg/150 ml premix     750 mg 150 mL/hr over 60 Minutes Intravenous Every 18 hours 12/29/17 2201     12/30/17 0600  aztreonam (AZACTAM) 2 g in sodium chloride 0.9 % 100 mL IVPB     2 g 200 mL/hr over 30 Minutes Intravenous Every 8 hours 12/29/17 2201     12/30/17 0600  metroNIDAZOLE (FLAGYL) IVPB 500 mg     500 mg 100 mL/hr over 60 Minutes Intravenous Every 8 hours 12/29/17 2201     12/29/17 2045  aztreonam (AZACTAM) 2 g in sodium chloride 0.9 % 100 mL IVPB     2 g 200 mL/hr over 30 Minutes Intravenous  Once 12/29/17 2030 12/29/17 2145   12/29/17 2045  metroNIDAZOLE (FLAGYL) IVPB 500 mg     500 mg 100 mL/hr over 60 Minutes Intravenous  Once 12/29/17 2030 12/29/17 2225   12/29/17 2045   vancomycin (VANCOCIN) IVPB 1000 mg/200 mL premix     1,000 mg 200 mL/hr over 60 Minutes Intravenous  Once 12/29/17 2030 12/29/17 2304      Scheduled Meds: . docusate sodium  100 mg Oral BID  . feeding supplement (GLUCERNA SHAKE)  237 mL Oral TID BM  . ferrous sulfate  325 mg Oral BID WC  . FLUoxetine  20 mg Oral Daily  . heparin  5,000 Units Subcutaneous Q8H  . insulin aspart  0-15 Units Subcutaneous TID WC  . insulin glargine  40 Units Subcutaneous QHS  . lisinopril  10 mg Oral Daily  .  metoCLOPramide  10 mg Oral TID AC  . ondansetron (ZOFRAN) IV  4 mg Intravenous Once  . oxyCODONE  10 mg Oral Q12H   Continuous Infusions: . sodium chloride 125 mL/hr at 12/30/17 0903  . aztreonam Stopped (12/30/17 0602)  . metronidazole Stopped (12/30/17 4235)  . vancomycin Stopped (12/30/17 0915)   PRN Meds:.acetaminophen **OR** acetaminophen, ALPRAZolam, ondansetron **OR** ondansetron (ZOFRAN) IV, oxyCODONE-acetaminophen, polyethylene glycol, promethazine  Allergies  Allergen Reactions  . Banana Hives, Nausea And Vomiting and Rash  . Keflex [Cephalexin] Anaphylaxis  . Onion Hives, Nausea And Vomiting and Rash  . Sulfa Antibiotics Anaphylaxis  . Grapeseed Extract [Nutritional Supplements] Itching  . Shellfish Allergy Hives    "ALL SEAFOOD"    Physical Exam: Patient's alert and well-oriented.  Is somewhat anxious about his foot condition.  Vitals  Blood pressure 118/76, pulse 97, temperature 98.1 F (36.7 C), temperature source Oral, resp. rate 16, height 5\' 6"  (1.676 m), weight 61.7 kg (136 lb), SpO2 97 %.  Lower Extremity exam:  Vascular: Pulses are palpable to the ankle.  Dermatological: Patient has a large wound on the lateral on the plantar aspect of the right foot.  The wound is approximately 7 cm in length and 5 cm in width.  Also portion along the plantar aspect where there is a wound also.  Tumorous tissue damage occurred at his previous debridement ~closed up completely so  wound VAC to utilize try to promote healing to the region.  Presentation today shows there is what appears to be some increased necrosis to the area of the wound.  No real bleeding is occurring in the region and also tissues are starting desiccated along the skin margins.  Does not appear to be a lot of cellulitis.  I did compress the foot and tried to see if any purulent drainage was occurring in the area and I did not visualize any.  Neurological: Patient does have some feeling the foot but likely has significant diabetic neuropathy.  He is a type I diabetic.  Ortho: Previous ray amputation for 5 right with continued exposed wound exposed bone in the region.  Data Review  CBC Recent Labs  Lab 12/24/17 0351 12/24/17 1429 12/25/17 0423 12/26/17 0441 12/29/17 2039  WBC 10.9*  --  12.1*  --  15.8*  HGB 8.6* 8.0* 6.4* 8.1* 7.7*  HCT 25.8* 24.3* 18.9*  --  23.4*  PLT 477*  --  482*  --  609*  MCV 83.9  --  83.0  --  86.7  MCH 27.9  --  28.1  --  28.3  MCHC 33.3  --  33.8  --  32.7  RDW 13.6  --  13.8  --  14.8*  LYMPHSABS  --   --  1.6  --  2.3  MONOABS  --   --  1.3*  --  1.3*  EOSABS  --   --  0.2  --  0.2  BASOSABS  --   --  0.1  --  0.1   ------------------------------------------------------------------------------------------------------------------  Chemistries  Recent Labs  Lab 12/24/17 1739 12/26/17 0441 12/27/17 0425 12/29/17 2039  NA  --   --  138 136  K  --   --  3.9 3.4*  CL  --   --  104 103  CO2  --   --  28 23  GLUCOSE  --   --  196* 425*  BUN  --   --  19 26*  CREATININE 1.40* 1.27* 1.08  1.34*  CALCIUM  --   --  8.0* 8.1*  AST  --   --   --  23  ALT  --   --   --  20  ALKPHOS  --   --   --  90  BILITOT  --   --   --  0.3   ------------------------------------------------------------------------------------------------------------------ estimated creatinine clearance is 70.3 mL/min (A) (by C-G formula based on SCr of 1.34 mg/dL  (H)). ------------------------------------------------------------------------------------------------------------------ Recent Labs    12/29/17 2039  TSH 1.502   Urinalysis    Component Value Date/Time   COLORURINE AMBER (A) 12/18/2017 1321   APPEARANCEUR CLOUDY (A) 12/18/2017 1321   APPEARANCEUR Clear 10/06/2014 0758   LABSPEC 1.014 12/18/2017 1321   LABSPEC 1.031 10/06/2014 0758   PHURINE 5.0 12/18/2017 1321   GLUCOSEU >=500 (A) 12/18/2017 1321   GLUCOSEU >=500 10/06/2014 0758   HGBUR SMALL (A) 12/18/2017 1321   BILIRUBINUR NEGATIVE 12/18/2017 1321   BILIRUBINUR Negative 10/06/2014 0758   KETONESUR NEGATIVE 12/18/2017 1321   PROTEINUR 100 (A) 12/18/2017 1321   UROBILINOGEN 0.2 03/29/2007 1506   NITRITE NEGATIVE 12/18/2017 1321   LEUKOCYTESUR NEGATIVE 12/18/2017 1321   LEUKOCYTESUR Negative 10/06/2014 0758     Imaging results:   Dg Foot Complete Right  Result Date: 12/29/2017 CLINICAL DATA:  Foot amputation.  Possible infection. EXAM: RIGHT FOOT COMPLETE - 3+ VIEW COMPARISON:  Foot MRI, 12/19/2017. Right foot radiographs, 12/18/2017. 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 The amputated margins of the remaining fourth and fifth metatarsals are well demarcated with no focal bone resorption to suggest osteomyelitis. Elsewhere, there are no areas of bone resorption to suggest osteomyelitis. Joints are normally aligned. There is no soft tissue air. IMPRESSION: 1. No radiographic evidence of osteomyelitis.  No soft tissue air. 2. 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 Electronically Signed   By: Lajean Manes M.D.   On: 12/29/2017 21:02    Assessment & Plan: I will take the patient back to the OR tomorrow to debride out the necrotic tissue see if we can  promote some bleeding to the region again.  I will also explore for any sinus tracts if I find none and everything looks pretty stable I will likely start back with a wound VAC.  We will also get vascular see him over the weekend. Active Problems:   Cellulitis   Family Communication: Plan discussed with patient   Albertine Patricia M.D on 12/30/2017 at 11:14 AM  Thank you for the consult, we will follow the patient with you in the Hospital.

## 2017-12-30 NOTE — Care Management (Signed)
[facility] informed CM that patient did not have a glucose monitor, therefore not monitoring glucose levels.  Patient does not have financial resources to purchase.  CM provided patient a monitor and supplies. Updated [facility] that patient now has a glucose monitor and to reinforce glycemic monitoring and disease management education to better control blood sugars and to hopefully promote wound healing.

## 2017-12-30 NOTE — H&P (Signed)
Shawn [name] is an 32 y.o. male.   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  Laboratory evaluation showed leukocytosis.  The patient also had tachycardia which prompted the emergency department staff to initiate sepsis protocol prior to calling the hospitalist service for admission.  Past Medical History:  Diagnosis Date  . Diabetes mellitus without complication ([place])   . GERD (gastroesophageal reflux disease)   . IBS (irritable bowel syndrome)     Past Surgical History:  Procedure Laterality Date  . ABDOMINAL AORTOGRAM W/LOWER EXTREMITY Right 12/23/2017   Procedure: ABDOMINAL AORTOGRAM W/LOWER EXTREMITY;  Surgeon: Katha Cabal, MD;  Location: [facility];  Service: Cardiovascular;  Laterality: Right;  . AMPUTATION Right 12/24/2017   Procedure: AMPUTATION RAY;  Surgeon: Sharlotte Alamo, DPM;  Location: [facility];  Service: Podiatry;  Laterality: Right;  . APPLICATION OF WOUND VAC Right 12/24/2017   Procedure: APPLICATION OF WOUND VAC;  Surgeon: Sharlotte Alamo, DPM;  Location: [facility];  Service: Podiatry;  Laterality: Right;  . IRRIGATION AND DEBRIDEMENT FOOT Right 12/20/2017   Procedure: IRRIGATION AND DEBRIDEMENT FOOT;  Surgeon: Sharlotte Alamo, DPM;  Location: [facility];  Service: Podiatry;  Laterality: Right;  . IRRIGATION AND DEBRIDEMENT FOOT Right 12/24/2017   Procedure: IRRIGATION AND DEBRIDEMENT FOOT;  Surgeon: Sharlotte Alamo, DPM;  Location: [facility];  Service: Podiatry;  Laterality: Right;    Family History  Problem Relation Age of Onset  . Diabetes Mother   . Ovarian cancer Mother    Social History:  reports that he has never smoked. He has never used smokeless tobacco. He reports that he has current or past drug history. Drug: Marijuana. He reports that he  does not drink alcohol.  Allergies:  Allergies  Allergen Reactions  . Banana Hives, Nausea And Vomiting and Rash  . Keflex [Cephalexin] Anaphylaxis  . Onion Hives, Nausea And Vomiting and Rash  . Sulfa Antibiotics Anaphylaxis  . Grapeseed Extract [Nutritional Supplements] Itching  . Shellfish Allergy Hives    "ALL SEAFOOD"    Medications Prior to Admission  Medication Sig Dispense Refill  . ALPRAZolam (XANAX) 0.25 MG tablet Take 1 tablet (0.25 mg total) by mouth 3 (three) times daily as needed for anxiety. 15 tablet 0  . doxycycline (VIBRA-TABS) 100 MG tablet Take 1 tablet (100 mg total) by mouth 2 (two) times daily with a meal for 14 days. 28 tablet 0  . FLUoxetine (PROZAC) 20 MG capsule Take 1 capsule (20 mg total) by mouth daily. 30 capsule 3  . insulin glargine (LANTUS) 100 UNIT/ML injection Inject 33 Units into the skin 2 (two) times daily.    Marland Kitchen lisinopril (PRINIVIL,ZESTRIL) 10 MG tablet Take 1 tablet (10 mg total) by mouth daily. 30 tablet 2  . NOVOLIN R RELION 100 UNIT/ML injection Inject 8 Units into the skin 3 (three) times daily with meals.  0  . oxyCODONE-acetaminophen (PERCOCET/ROXICET) 5-325 MG tablet Take 1 tablet by mouth every 6 (six) hours as needed for moderate pain. 20 tablet 0  . promethazine (PHENERGAN) 12.5 MG tablet Take 1 tablet (12.5 mg total) by mouth every 6 (six) hours as needed for nausea or vomiting. 20 tablet 0  . feeding supplement, GLUCERNA SHAKE, (GLUCERNA SHAKE) LIQD Take 237 mLs by mouth 3 (three) times daily between meals. 60 Can 0  .  ferrous sulfate 325 (65 FE) MG tablet Take 1 tablet (325 mg total) by mouth 2 (two) times daily with a meal. 60 tablet 0  . insulin aspart (NOVOLOG) 100 UNIT/ML injection Inject 8 Units into the skin 3 (three) times daily with meals. (Patient not taking: Reported on 12/29/2017) 10 mL 0  . Insulin Syringes, Disposable, U-100 0.3 ML MISC 1 Syringe by Does not apply route 4 (four) times daily -  with meals and at bedtime. 100  each 0  . metoCLOPramide (REGLAN) 10 MG tablet Take 1 tablet (10 mg total) by mouth 3 (three) times daily before meals. 120 tablet 0  . polyethylene glycol (MIRALAX) packet Take 17 g by mouth daily as needed for moderate constipation. 14 each 0    Results for orders placed or performed during the hospital encounter of 12/29/17 (from the past 48 hour(s))  Comprehensive metabolic panel     Status: Abnormal   Collection Time: 12/29/17  8:39 PM  Result Value Ref Range   Sodium 136 135 - 145 mmol/L   Potassium 3.4 (L) 3.5 - 5.1 mmol/L   Chloride 103 98 - 111 mmol/L    Comment: Please note change in reference range.   CO2 23 22 - 32 mmol/L   Glucose, Bld 425 (H) 70 - 99 mg/dL    Comment: Please note change in reference range.   BUN 26 (H) 6 - 20 mg/dL    Comment: Please note change in reference range.   Creatinine, Ser 1.34 (H) 0.61 - 1.24 mg/dL   Calcium 8.1 (L) 8.9 - 10.3 mg/dL   Total Protein 6.4 (L) 6.5 - 8.1 g/dL   Albumin 2.3 (L) 3.5 - 5.0 g/dL   AST 23 15 - 41 U/L   ALT 20 0 - 44 U/L    Comment: Please note change in reference range.   Alkaline Phosphatase 90 38 - 126 U/L   Total Bilirubin 0.3 0.3 - 1.2 mg/dL   GFR calc non Af Amer >60 >60 mL/min   GFR calc Af Amer >60 >60 mL/min    Comment: (NOTE) The eGFR has been calculated using the CKD EPI equation. This calculation has not been validated in all clinical situations. eGFR's persistently <60 mL/min signify possible Chronic Kidney Disease.    Anion gap 10 5 - 15    Comment: Performed at [facility], [address] [place], [place] [address]  CBC WITH DIFFERENTIAL     Status: Abnormal   Collection Time: 12/29/17  8:39 PM  Result Value Ref Range   WBC 15.8 (H) 3.8 - 10.6 K/uL   RBC 2.70 (L) 4.40 - 5.90 MIL/uL   Hemoglobin 7.7 (L) 13.0 - 18.0 g/dL   HCT 23.4 (L) 40.0 - 52.0 %   MCV 86.7 80.0 - 100.0 fL   MCH 28.3 26.0 - 34.0 pg   MCHC 32.7 32.0 - 36.0 g/dL   RDW 14.8 (H) 11.5 - 14.5 %   Platelets 609 (H)  150 - 440 K/uL   Neutrophils Relative % 75 %   Neutro Abs 11.9 (H) 1.4 - 6.5 K/uL   Lymphocytes Relative 14 %   Lymphs Abs 2.3 1.0 - 3.6 K/uL   Monocytes Relative 8 %   Monocytes Absolute 1.3 (H) 0.2 - 1.0 K/uL   Eosinophils Relative 2 %   Eosinophils Absolute 0.2 0 - 0.7 K/uL   Basophils Relative 1 %   Basophils Absolute 0.1 0 - 0.1 K/uL   Smear Review MORPHOLOGY UNREMARKABLE  Comment: Performed at [facility], [address] [place], [place] [address]  Lactic acid, plasma     Status: None   Collection Time: 12/29/17  8:39 PM  Result Value Ref Range   Lactic Acid, Venous 1.8 0.5 - 1.9 mmol/L    Comment: Performed at [facility], [address] [place], [place] [address]  TSH     Status: None   Collection Time: 12/29/17  8:39 PM  Result Value Ref Range   TSH 1.502 0.350 - 4.500 uIU/mL    Comment: Performed by a 3rd Generation assay with a functional sensitivity of <=0.01 uIU/mL. Performed at [facility], [address] [place], [place] [address]   Glucose, capillary     Status: Abnormal   Collection Time: 12/29/17  9:57 PM  Result Value Ref Range   Glucose-Capillary 270 (H) 70 - 99 mg/dL  Glucose, capillary     Status: Abnormal   Collection Time: 12/30/17 12:27 AM  Result Value Ref Range   Glucose-Capillary 131 (H) 70 - 99 mg/dL   Dg Foot Complete Right  Result Date: 12/29/2017 CLINICAL DATA:  Foot amputation.  Possible infection. EXAM: RIGHT FOOT COMPLETE - 3+ VIEW COMPARISON:  Foot MRI, 12/19/2017. Right foot radiographs, 12/18/2017. 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 The amputated margins of the remaining fourth and fifth metatarsals are well demarcated with no focal bone resorption to suggest osteomyelitis. Elsewhere, there are no areas of bone resorption to suggest  osteomyelitis. Joints are normally aligned. There is no soft tissue air. IMPRESSION: 1. No radiographic evidence of osteomyelitis.  No soft tissue air. 2. 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 Electronically Signed   By: Lajean Manes M.D.   On: 12/29/2017 21:02    Review of Systems  Constitutional: Negative for chills and fever.  HENT: Negative for sore throat and tinnitus.   Eyes: Negative for blurred vision and redness.  Respiratory: Negative for cough and shortness of breath.   Cardiovascular: Negative for chest pain, palpitations, orthopnea and PND.  Gastrointestinal: Negative for abdominal pain, diarrhea, nausea and vomiting.  Genitourinary: Negative for dysuria, frequency and urgency.  Musculoskeletal: Positive for joint pain. Negative for myalgias.  Skin: Negative for rash.       No lesions  Neurological: Negative for speech change, focal weakness and weakness.  Endo/Heme/Allergies: Does not bruise/bleed easily.       No temperature intolerance  Psychiatric/Behavioral: Negative for depression and suicidal ideas.    Blood pressure 113/80, pulse 100, temperature 98 F (36.7 C), temperature source Oral, resp. rate 17, height '5\' 6"'$  (1.676 m), weight 61.7 kg (136 lb), SpO2 100 %. Physical Exam  Vitals reviewed. Constitutional: He is oriented to person, place, and time. He appears well-developed and well-nourished. No distress.  HENT:  Head: Normocephalic and atraumatic.  Mouth/Throat: Oropharynx is clear and moist.  Eyes: Pupils are equal, round, and reactive to light. Conjunctivae and EOM are normal. No scleral icterus.  Neck: Normal range of motion. Neck supple. No JVD present. No tracheal deviation present. No thyromegaly present.  Cardiovascular: Normal rate, regular rhythm and normal heart sounds. Exam reveals no gallop and no friction rub.  No murmur heard. Respiratory: Effort normal and breath sounds normal. No  respiratory distress.  GI: Soft. Bowel  sounds are normal. He exhibits no distension. There is no tenderness.  Genitourinary:  Genitourinary Comments: Deferred  Musculoskeletal: Normal range of motion. He exhibits no edema.  Lymphadenopathy:    He has no cervical adenopathy.  Neurological: He is alert and oriented to person, place, and time. No cranial nerve deficit.  Skin: Skin is warm and dry. No rash noted. No erythema.  Psychiatric: He has a normal mood and affect. His behavior is normal. Judgment and thought content normal.     Assessment/Plan This is a 32 year old male admitted for cellulitis. 1.  Cellulitis: Wound VAC in place.  Obtain wound consult.  I have started patient on aztreonam as his previous wound cultures have shown strep agalactiae with MSSA as well.  Consider adding anaerobic coverage as well. 2.  Sepsis: The patient meets criteria via leukocytosis and tachycardia.  He is hemodynamically stable.  Follow blood cultures for growth and sensitivities. 3.  Hypertension: Controlled; continue lisinopril 4.  Diabetes mellitus type 1: Continue basal insulin therapy adjusted for hospital diet.  Also sliding scale insulin.   5.  DVT prophylaxis: Heparin 6.  GI prophylaxis: None The patient is a full code.  Time spent on admission orders and patient care approximately 45 minutes  Harrie Foreman, MD 12/30/2017, 4:18 AM

## 2017-12-31 LAB — COMPREHENSIVE METABOLIC PANEL
ALBUMIN: 1.9 g/dL — AB (ref 3.5–5.0)
ALK PHOS: 167 U/L — AB (ref 38–126)
ALT: 71 U/L — ABNORMAL HIGH (ref 0–44)
ANION GAP: 6 (ref 5–15)
AST: 81 U/L — ABNORMAL HIGH (ref 15–41)
BUN: 23 mg/dL — ABNORMAL HIGH (ref 6–20)
CHLORIDE: 110 mmol/L (ref 98–111)
CO2: 22 mmol/L (ref 22–32)
Calcium: 8.1 mg/dL — ABNORMAL LOW (ref 8.9–10.3)
Creatinine, Ser: 1.07 mg/dL (ref 0.61–1.24)
GFR calc Af Amer: >60 mL/min (ref >60)
GFR calc non Af Amer: >60 mL/min (ref >60)
GLUCOSE: 93 mg/dL (ref 70–99)
Potassium: 4.3 mmol/L (ref 3.5–5.1)
SODIUM: 138 mmol/L (ref 135–145)
Total Bilirubin: 0.4 mg/dL (ref 0.3–1.2)
Total Protein: 5.7 g/dL — ABNORMAL LOW (ref 6.5–8.1)

## 2017-12-31 LAB — CBC
HCT: 22.8 % — ABNORMAL LOW (ref 40.0–52.0)
HEMOGLOBIN: 7.6 g/dL — AB (ref 13.0–18.0)
MCH: 28.7 pg (ref 26.0–34.0)
MCHC: 33.5 g/dL (ref 32.0–36.0)
MCV: 85.7 fL (ref 80.0–100.0)
Platelets: 577 10*3/uL — ABNORMAL HIGH (ref 150–440)
RBC: 2.66 MIL/uL — AB (ref 4.40–5.90)
RDW: 14.5 % (ref 11.5–14.5)
WBC: 10.8 10*3/uL — ABNORMAL HIGH (ref 3.8–10.6)

## 2017-12-31 LAB — GLUCOSE, CAPILLARY
Glucose-Capillary: 28 mg/dL — CL (ref 70–99)
Glucose-Capillary: 65 mg/dL — ABNORMAL LOW (ref 70–99)
Glucose-Capillary: 82 mg/dL (ref 70–99)
Glucose-Capillary: 29 mg/dL — CL (ref 70–99)
Glucose-Capillary: 100 mg/dL — ABNORMAL HIGH (ref 70–99)
Glucose-Capillary: 54 mg/dL — ABNORMAL LOW (ref 70–99)
Glucose-Capillary: 134 mg/dL — ABNORMAL HIGH (ref 70–99)
Glucose-Capillary: 123 mg/dL — ABNORMAL HIGH (ref 70–99)
Glucose-Capillary: 108 mg/dL — ABNORMAL HIGH (ref 70–99)
Glucose-Capillary: 172 mg/dL — ABNORMAL HIGH (ref 70–99)

## 2017-12-31 LAB — VANCOMYCIN, TROUGH: Vancomycin Tr: 19 ug/mL (ref 15–20)

## 2017-12-31 MED ORDER — MIDAZOLAM HCL 2 MG/2ML IJ SOLN
INTRAMUSCULAR | Status: AC
Start: 2017-12-31 — End: ?
  Filled 2017-12-31: qty 2

## 2017-12-31 MED ORDER — DEXTROSE 50 % IV SOLN
25.0000 mL | Freq: Once | INTRAVENOUS | Status: AC
  Administered 2017-12-31: 25 mL via INTRAVENOUS

## 2017-12-31 MED ORDER — MORPHINE SULFATE (PF) 2 MG/ML IV SOLN
2.0000 mg | Freq: Once | INTRAVENOUS | Status: AC
  Administered 2017-12-31: 2 mg via INTRAVENOUS
  Filled 2017-12-31: qty 1

## 2017-12-31 MED ORDER — DEXTROSE 50 % IV SOLN
1.0000 | Freq: Once | INTRAVENOUS | Status: AC
  Administered 2017-12-31: 50 mL via INTRAVENOUS

## 2017-12-31 MED ORDER — FENTANYL CITRATE (PF) 100 MCG/2ML IJ SOLN
INTRAMUSCULAR | Status: AC
  Filled 2017-12-31: qty 2

## 2017-12-31 MED ORDER — DEXTROSE 10 % IV SOLN
INTRAVENOUS | Status: DC
  Administered 2017-12-31 – 2018-01-01 (×4): via INTRAVENOUS

## 2017-12-31 MED ORDER — DEXTROSE 50 % IV SOLN
25.0000 g | Freq: Once | INTRAVENOUS | Status: AC
  Administered 2017-12-31: 25 g via INTRAVENOUS
  Filled 2017-12-31: qty 50

## 2017-12-31 MED ORDER — PROPOFOL 10 MG/ML IV BOLUS
INTRAVENOUS | Status: AC
  Filled 2017-12-31: qty 20

## 2017-12-31 MED ORDER — DEXTROSE 50 % IV SOLN
INTRAVENOUS | Status: AC
  Administered 2017-12-31: 50 mL via INTRAVENOUS
  Filled 2017-12-31: qty 50

## 2017-12-31 MED ORDER — DEXTROSE 50 % IV SOLN
INTRAVENOUS | Status: AC
  Filled 2017-12-31: qty 50

## 2017-12-31 MED ORDER — LIDOCAINE HCL (PF) 2 % IJ SOLN
INTRAMUSCULAR | Status: AC
  Filled 2017-12-31: qty 10

## 2017-12-31 NOTE — Progress Notes (Signed)
I have this patient scheduled for debridement this morning but overnight his blood sugars dropped considerably with a lowest of around 20.  We elected to postpone his debridement until tomorrow and hopefully be more stable at that point.  He is being followed by internal medicine as well as endocrinology.

## 2017-12-31 NOTE — Progress Notes (Signed)
[facility] [place] NAME: Shawn [name]    MR#:  [id number]  DATE OF BIRTH:  December 13, 1985  SUBJECTIVE:   She is here due to a right diabetic foot ulcer/infection.  Patient's blood sugars dropped significantly overnight to as low as in the 20s, he was although asymptomatic.  He is somewhat hypothermic this morning.  Patient denies any other complaints other than some mild nausea.  REVIEW OF SYSTEMS:    Review of Systems  Constitutional: Positive for chills. Negative for fever.  HENT: Negative for congestion and tinnitus.   Eyes: Negative for blurred vision and double vision.  Respiratory: Negative for cough, shortness of breath and wheezing.   Cardiovascular: Negative for chest pain, orthopnea and PND.  Gastrointestinal: Positive for nausea. Negative for abdominal pain, diarrhea and vomiting.  Genitourinary: Negative for dysuria and hematuria.  Neurological: Negative for dizziness, sensory change and focal weakness.  All other systems reviewed and are negative.   Nutrition: Carb control/Heart Healthy Tolerating Diet: Yes Tolerating PT: Await Eval.     DRUG ALLERGIES:   Allergies  Allergen Reactions  . Banana Hives, Nausea And Vomiting and Rash  . Keflex [Cephalexin] Anaphylaxis  . Onion Hives, Nausea And Vomiting and Rash  . Sulfa Antibiotics Anaphylaxis  . Grapeseed Extract [Nutritional Supplements] Itching  . Shellfish Allergy Hives    "ALL SEAFOOD"    VITALS:  Blood pressure (!) 148/96, pulse 79, temperature 98.7 F (37.1 C), temperature source Oral, resp. rate 18, height 5\' 6"  (1.676 m), weight 67.8 kg (149 lb 8 oz), SpO2 100 %.  PHYSICAL EXAMINATION:   Physical Exam  GENERAL:  32 y.o.-year-old patient lying in bed with a bear hugger in place but in NAD.   EYES: Pupils equal, round, reactive to light and accommodation. No scleral icterus. Extraocular muscles intact.  HEENT: Head atraumatic, normocephalic. Oropharynx  and nasopharynx clear.  NECK:  Supple, no jugular venous distention. No thyroid enlargement, no tenderness.  LUNGS: Normal breath sounds bilaterally, no wheezing, rales, rhonchi. No use of accessory muscles of respiration.  CARDIOVASCULAR: S1, S2 normal. No murmurs, rubs, or gallops.  ABDOMEN: Soft, nontender, nondistended. Bowel sounds present. No organomegaly or mass.  EXTREMITIES: No cyanosis, clubbing or edema b/l. Right foot dressing in place from the ulcer.    NEUROLOGIC: Cranial nerves II through XII are intact. No focal Motor or sensory deficits b/l.   PSYCHIATRIC: The patient is alert and oriented x 3.  SKIN: No obvious rash, lesion, or ulcer.    LABORATORY PANEL:   CBC Recent Labs  Lab 12/31/17 0540  WBC 10.8*  HGB 7.6*  HCT 22.8*  PLT 577*   ------------------------------------------------------------------------------------------------------------------  Chemistries  Recent Labs  Lab 12/31/17 0540  NA 138  K 4.3  CL 110  CO2 22  GLUCOSE 93  BUN 23*  CREATININE 1.07  CALCIUM 8.1*  AST 81*  ALT 71*  ALKPHOS 167*  BILITOT 0.4   ------------------------------------------------------------------------------------------------------------------  Cardiac Enzymes No results for input(s): TROPONINI in the last 168 hours. ------------------------------------------------------------------------------------------------------------------  RADIOLOGY:  Dg Foot Complete Right  Result Date: 12/29/2017 CLINICAL DATA:  Foot amputation.  Possible infection. EXAM: RIGHT FOOT COMPLETE - 3+ VIEW COMPARISON:  Foot MRI, 12/19/2017. Right foot radiographs, 12/18/2017. 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 The amputated margins of the remaining fourth and  fifth metatarsals are well demarcated with no focal bone  resorption to suggest osteomyelitis. Elsewhere, there are no areas of bone resorption to suggest osteomyelitis. Joints are normally aligned. There is no soft tissue air. IMPRESSION: 1. No radiographic evidence of osteomyelitis.  No soft tissue air. 2. 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 Electronically Signed   By: Lajean Manes M.D.   On: 12/29/2017 21:02     ASSESSMENT AND PLAN:   32 year old male with past medical history of diabetes, chronic anemia, hypertension who was recently admitted to the hospital for sepsis secondary to right diabetic foot ulcer and discharge returns back secondary to worsening pain in the right foot.  1.  Right diabetic foot ulcer/cellulitis- continue broad-spectrum IV antibiotics with vancomycin and aztreonam, Flagyl.  - Podiatry following and plan was for surgical debridement today but it was postponed due to patient's hypothermia and hypoglycemia. -Continue IV antibiotics and supportive care as mentioned.  2.  Hypoglycemia/hypothermia- secondary to underlying infection.  Improved with dextrose drip and bear hugger. - Continue supportive care follow blood sugars, temperature.  3.  Essential hypertension-continue lisinopril.  4.  Depression - cont. Prozac.   5. Anemia - this is anemia of chronic disease.   All the records are reviewed and case discussed with Care Management/Social Worker. Management plans discussed with the patient, family and they are in agreement.  CODE STATUS: Full code  DVT Prophylaxis: Hep. SQ  TOTAL TIME TAKING CARE OF THIS PATIENT: 30 minutes.   POSSIBLE D/C IN 3-4 DAYS, DEPENDING ON CLINICAL CONDITION.   Henreitta Leber M.D on 12/31/2017 at 12:39 PM  Between 7am to 6pm - Pager - [phone]  After 6pm go to [email or web address] - Technical brewer [facility]  Office  [phone]  CC: Primary care physician; [place],  MD

## 2017-12-31 NOTE — Anesthesia Preprocedure Evaluation (Deleted)
Anesthesia Evaluation    Airway        Dental   Pulmonary           Cardiovascular      Neuro/Psych    GI/Hepatic   Endo/Other  diabetes  Renal/GU      Musculoskeletal   Abdominal   Peds  Hematology   Anesthesia Other Findings   Reproductive/Obstetrics                                                              Anesthesia Evaluation  Patient identified by MRN, date of birth, ID band Patient awake    Reviewed: Allergy & Precautions, H&P , NPO status , Patient's Chart, lab work & pertinent test results  History of Anesthesia Complications Negative for: history of anesthetic complications  Airway Mallampati: II  TM Distance: >3 FB Neck ROM: full    Dental  (+) Chipped, Poor Dentition, Missing   Pulmonary neg pulmonary ROS, neg shortness of breath,           Cardiovascular Exercise Tolerance: Good (-) angina(-) Past MI and (-) DOE negative cardio ROS       Neuro/Psych PSYCHIATRIC DISORDERS Depression negative neurological ROS     GI/Hepatic Neg liver ROS, GERD  Medicated and Controlled,  Endo/Other  diabetes, Poorly Controlled, Type 2  Renal/GU      Musculoskeletal   Abdominal   Peds  Hematology negative hematology ROS (+)   Anesthesia Other Findings Past Medical History: No date: Diabetes mellitus without complication ([facility]) No date: GERD (gastroesophageal reflux disease) No date: IBS (irritable bowel syndrome)  Past Surgical History: 12/20/2017: IRRIGATION AND DEBRIDEMENT FOOT; Right     Comment:  Procedure: IRRIGATION AND DEBRIDEMENT FOOT;  Surgeon:               Sharlotte Alamo, DPM;  Location: [facility];  Service:               Podiatry;  Laterality: Right;  BMI    Body Mass Index:  18.56 kg/m      Reproductive/Obstetrics negative OB ROS                             Anesthesia Physical Anesthesia Plan  ASA:  III  Anesthesia Plan: General   Post-op Pain Management: GA combined w/ Regional for post-op pain   Induction: Intravenous  PONV Risk Score and Plan: Ondansetron, Dexamethasone and Midazolam  Airway Management Planned: LMA  Additional Equipment:   Intra-op Plan:   Post-operative Plan: Extubation in OR  Informed Consent: I have reviewed the patients History and Physical, chart, labs and discussed the procedure including the risks, benefits and alternatives for the proposed anesthesia with the patient or authorized representative who has indicated his/her understanding and acceptance.   Dental Advisory Given  Plan Discussed with: Anesthesiologist, CRNA and Surgeon  Anesthesia Plan Comments: (Patient consented for risks of anesthesia including but not limited to:  - adverse reactions to medications - damage to teeth, lips or other oral mucosa - sore throat or hoarseness - Damage to heart, brain, lungs or loss of life  Patient voiced understanding.)       Anesthesia Quick Evaluation  Anesthesia Physical Anesthesia Plan  ASA:   Anesthesia  Plan:    Post-op Pain Management:    Induction:   PONV Risk Score and Plan:   Airway Management Planned:   Additional Equipment:   Intra-op Plan:   Post-operative Plan:   Informed Consent:   Plan Discussed with:   Anesthesia Plan Comments: (We would like the patients serum glucose more stable for the procedure if possible )        Anesthesia Quick Evaluation

## 2017-12-31 NOTE — Progress Notes (Signed)
Inpatient Diabetes Program Recommendations  AACE/ADA: New Consensus Statement on Inpatient Glycemic Control (2015)  Target Ranges:  Prepandial:   less than 140 mg/dL      Peak postprandial:   less than 180 mg/dL (1-2 hours)      Critically ill patients:  140 - 180 mg/dL   Lab Results  Component Value Date   GLUCAP 134 (H) 12/31/2017   HGBA1C 16.9 (H) 12/19/2017    Review of Glycemic Control  Hypo this am - 28, 29. Surgery cancelled d/t hypos. Reduce basal insulin.  Inpatient Diabetes Program Recommendations:     Reduce Lantus to 25 units QHS  Continue to follow.  Thank you. Lorenda Peck, RD, LDN, CDE Inpatient Diabetes Coordinator [phone]

## 2017-12-31 NOTE — Progress Notes (Signed)
Changed patient's R foot wound dressing at this time, as ordered for once daily today. Appreciate the help of [place], [place]. Patient tolerated well. Will continue to monitor wound dressing status. Wenda Low [facility]

## 2017-12-31 NOTE — Consult Note (Signed)
Pharmacy Antibiotic Note  Shawn [name] is a 32 y.o. male admitted on 12/29/2017 with worsening right foot infection. 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    Pharmacy has been consulted for Vancomycin, aztreonam, and metronidazole dosing.   Plan: 6/29 1925 Vanc trough 19. Level is therapeutic. Will continue with current regimen of vancomycin 750mg  IV every 12 hours. Will redraw trough after 4 doses to confirm.   Continue Aztreonam 2g IV every 8 hours.   Continue Metronidazole 500 mg IV every 8 hours.    Height: 5\' 6"  (167.6 cm) Weight: 149 lb 8 oz (67.8 kg) IBW/kg (Calculated) : 63.8  Temp (24hrs), Avg:97.3 F (36.3 C), Min:93.3 F (34.1 C), Max:98.7 F (37.1 C)  Recent Labs  Lab 12/25/17 0423 12/26/17 0441 12/27/17 0425 12/29/17 2039 12/31/17 0540 12/31/17 1925  WBC 12.1*  --   --  15.8* 10.8*  --   CREATININE  --  1.27* 1.08 1.34* 1.07  --   LATICACIDVEN  --   --   --  1.8  --   --   VANCOTROUGH  --  21*  --   --   --  19    Estimated Creatinine Clearance: 91.1 mL/min (by C-G formula based on SCr of 1.07 mg/dL).    Allergies  Allergen Reactions  . Banana Hives, Nausea And Vomiting and Rash  . Keflex [Cephalexin] Anaphylaxis  . Onion Hives, Nausea And Vomiting and Rash  . Sulfa Antibiotics Anaphylaxis  . Grapeseed Extract [Nutritional Supplements] Itching  . Shellfish Allergy Hives    "ALL SEAFOOD"    Antimicrobials this admission: 6/27 Aztreonam >>  6/27 Vancomycin >>  6/27 Metronidazole>>   Dose adjustments this admission:  Microbiology results: 6/27 BCx: NGTD   Thank you for allowing pharmacy to be a part of this patient's care.  Pernell Dupre, PharmD, BCPS Clinical Pharmacist 12/31/2017 7:58 PM

## 2017-12-31 NOTE — Progress Notes (Signed)
Hypoglycemic Event  CBG: 29 at 0324  Treatment: D50 IV 50 mL & D10 at 100 mL/hr  Symptoms: Pale, Sweaty and Shaky  Follow-up CBG: Time: 0422 CBG Result: 100  Possible Reasons for Event: Inadequate meal intake  Comments/MD notified: MD Marcille Blanco notified and per MD, give one amp of D50 now and start D10 at 100 mL/hr. Nursing staff will continue to monitor for any changes in patient status.  Earleen Reaper

## 2017-12-31 NOTE — Progress Notes (Signed)
Spoke to Dr. Elvina Mattes earlier this AM regarding wound care orders for today, and he stated to follow the ordered wound care but only to perform once today (instead of TID). Will complete later in shift. Wenda Low [facility]

## 2017-12-31 NOTE — Progress Notes (Addendum)
Hypoglycemic Event  CBG: 28  Treatment: D50 IV 25 mL  Symptoms: Pale, Sweaty and Shaky  Follow-up CBG: Time: 0130 CBG Result: 65  Possible Reasons for Event: Inadequate meal intake  Comments/MD notified: 2nd amp of D50 to be given per standing orders and will recheck in 15 minutes  CBG at 0152: 82  Nursing staff will continue to monitor for any changes in patient status.  Earleen Reaper

## 2017-12-31 NOTE — Plan of Care (Signed)
  Problem: Health Behavior/Discharge Planning: Goal: Ability to manage health-related needs will improve Outcome: Not Progressing Note:  Patient experienced a hypoglycemic event this AM with associated shaking, shivering, and chills. He also experienced two similar events overnight. Lantus dose has been reduced. Surgery for this AM was cancelled. Will continue to monitor DM status. Wenda Low [facility]

## 2018-01-01 ENCOUNTER — Inpatient Hospital Stay: Payer: BLUE CROSS/BLUE SHIELD | Admitting: Anesthesiology

## 2018-01-01 ENCOUNTER — Encounter
Admission: EM | Disposition: A | Discharge status: Home / Self Care | Payer: Self-pay | Source: Home / Self Care | Attending: Specialist

## 2018-01-01 ENCOUNTER — Encounter: Payer: Self-pay | Admitting: Anesthesiology

## 2018-01-01 HISTORY — PX: IRRIGATION AND DEBRIDEMENT FOOT: SHX6602

## 2018-01-01 HISTORY — PX: APPLICATION OF WOUND VAC: SHX5189

## 2018-01-01 LAB — GLUCOSE, CAPILLARY
Glucose-Capillary: 215 mg/dL — ABNORMAL HIGH (ref 70–99)
Glucose-Capillary: 149 mg/dL — ABNORMAL HIGH (ref 70–99)
Glucose-Capillary: 472 mg/dL — ABNORMAL HIGH (ref 70–99)
Glucose-Capillary: 369 mg/dL — ABNORMAL HIGH (ref 70–99)

## 2018-01-01 LAB — HEMOGLOBIN: Hemoglobin: 7.7 g/dL — ABNORMAL LOW (ref 13.0–18.0)

## 2018-01-01 SURGERY — IRRIGATION AND DEBRIDEMENT FOOT
Anesthesia: General | Site: Foot | Laterality: Right | Wound class: "Dirty or Infected "

## 2018-01-01 MED ORDER — ONDANSETRON HCL 4 MG/2ML IJ SOLN
INTRAMUSCULAR | Status: AC
  Filled 2018-01-01: qty 2

## 2018-01-01 MED ORDER — MIDAZOLAM HCL 2 MG/2ML IJ SOLN
INTRAMUSCULAR | Status: AC
  Filled 2018-01-01: qty 2

## 2018-01-01 MED ORDER — FENTANYL CITRATE (PF) 100 MCG/2ML IJ SOLN
INTRAMUSCULAR | Status: AC
  Filled 2018-01-01: qty 2

## 2018-01-01 MED ORDER — SODIUM CHLORIDE FLUSH 0.9 % IV SOLN
INTRAVENOUS | Status: AC
  Filled 2018-01-01: qty 10

## 2018-01-01 MED ORDER — LIDOCAINE HCL 1 % IJ SOLN
INTRAMUSCULAR | Status: DC | PRN
  Administered 2018-01-01: 18 mL

## 2018-01-01 MED ORDER — MIDAZOLAM HCL 2 MG/2ML IJ SOLN
INTRAMUSCULAR | Status: DC | PRN
  Administered 2018-01-01: 2 mg via INTRAVENOUS

## 2018-01-01 MED ORDER — FENTANYL CITRATE (PF) 100 MCG/2ML IJ SOLN
25.0000 ug | INTRAMUSCULAR | Status: DC | PRN
  Administered 2018-01-01 (×3): 50 ug via INTRAVENOUS

## 2018-01-01 MED ORDER — FENTANYL CITRATE (PF) 100 MCG/2ML IJ SOLN
INTRAMUSCULAR | Status: DC | PRN
  Administered 2018-01-01: 25 ug via INTRAVENOUS

## 2018-01-01 MED ORDER — PROPOFOL 10 MG/ML IV BOLUS
INTRAVENOUS | Status: DC | PRN
  Administered 2018-01-01: 200 mg via INTRAVENOUS

## 2018-01-01 MED ORDER — LIDOCAINE HCL (PF) 2 % IJ SOLN
INTRAMUSCULAR | Status: AC
  Filled 2018-01-01: qty 10

## 2018-01-01 MED ORDER — PROPOFOL 500 MG/50ML IV EMUL
INTRAVENOUS | Status: DC | PRN
  Administered 2018-01-01: 25 ug/kg/min via INTRAVENOUS

## 2018-01-01 MED ORDER — PROMETHAZINE HCL 25 MG/ML IJ SOLN
INTRAMUSCULAR | Status: AC
  Filled 2018-01-01: qty 1

## 2018-01-01 MED ORDER — PROPOFOL 500 MG/50ML IV EMUL
INTRAVENOUS | Status: AC
  Filled 2018-01-01: qty 50

## 2018-01-01 MED ORDER — DEXAMETHASONE SODIUM PHOSPHATE 10 MG/ML IJ SOLN
INTRAMUSCULAR | Status: DC | PRN
  Administered 2018-01-01: 4 mg via INTRAVENOUS

## 2018-01-01 MED ORDER — LIDOCAINE HCL (CARDIAC) PF 100 MG/5ML IV SOSY
PREFILLED_SYRINGE | INTRAVENOUS | Status: DC | PRN
  Administered 2018-01-01: 60 mg via INTRAVENOUS

## 2018-01-01 MED ORDER — ONDANSETRON HCL 4 MG/2ML IJ SOLN
INTRAMUSCULAR | Status: DC | PRN
  Administered 2018-01-01: 4 mg via INTRAVENOUS

## 2018-01-01 MED ORDER — PROMETHAZINE HCL 25 MG/ML IJ SOLN
6.2500 mg | INTRAMUSCULAR | Status: DC | PRN
  Administered 2018-01-01: 6.25 mg via INTRAVENOUS

## 2018-01-01 MED ORDER — DEXAMETHASONE SODIUM PHOSPHATE 10 MG/ML IJ SOLN
INTRAMUSCULAR | Status: AC
  Filled 2018-01-01: qty 1

## 2018-01-01 SURGICAL SUPPLY — 42 items
BAG COUNTER SPONGE EZ (MISCELLANEOUS) ×1 IMPLANT
BANDAGE ELASTIC 3 LF NS (GAUZE/BANDAGES/DRESSINGS) ×2 IMPLANT
BANDAGE ELASTIC 4 LF NS (GAUZE/BANDAGES/DRESSINGS) ×2 IMPLANT
BNDG CONFORM 3 STRL LF (GAUZE/BANDAGES/DRESSINGS) ×2 IMPLANT
BNDG ESMARK 4X12 TAN STRL LF (GAUZE/BANDAGES/DRESSINGS) ×2 IMPLANT
BNDG GAUZE 4.5X4.1 6PLY STRL (MISCELLANEOUS) ×2 IMPLANT
CANISTER SUCT 1200ML W/VALVE (MISCELLANEOUS) ×2 IMPLANT
CUFF TOURN 18 STER (MISCELLANEOUS) ×2 IMPLANT
CUFF TOURN DUAL PL 12 NO SLV (MISCELLANEOUS) ×1 IMPLANT
DRAPE FLUOR MINI C-ARM 54X84 (DRAPES) ×1 IMPLANT
DURAPREP 26ML APPLICATOR (WOUND CARE) ×2 IMPLANT
ELECT REM PT RETURN 9FT ADLT (ELECTROSURGICAL) ×2
ELECTRODE REM PT RTRN 9FT ADLT (ELECTROSURGICAL) ×1 IMPLANT
GAUZE PETRO XEROFOAM 1X8 (MISCELLANEOUS) ×2 IMPLANT
GAUZE SPONGE 4X4 12PLY STRL (GAUZE/BANDAGES/DRESSINGS) ×2 IMPLANT
GLOVE BIO SURGEON STRL SZ8 (GLOVE) ×2 IMPLANT
GLOVE INDICATOR 8.0 STRL GRN (GLOVE) ×2 IMPLANT
GOWN STRL REUS W/ TWL LRG LVL3 (GOWN DISPOSABLE) ×1 IMPLANT
GOWN STRL REUS W/TWL LRG LVL3 (GOWN DISPOSABLE) ×1
HANDPIECE VERSAJET DEBRIDEMENT (MISCELLANEOUS) ×1 IMPLANT
KIT DRSG VAC SLVR GRANUFM (MISCELLANEOUS) ×1 IMPLANT
KIT TURNOVER KIT A (KITS) ×2 IMPLANT
LABEL OR SOLS (LABEL) ×2 IMPLANT
NDL FILTER BLUNT 18X1 1/2 (NEEDLE) ×1 IMPLANT
NDL HYPO 25X1 1.5 SAFETY (NEEDLE) ×2 IMPLANT
NEEDLE FILTER BLUNT 18X 1/2SAF (NEEDLE)
NEEDLE FILTER BLUNT 18X1 1/2 (NEEDLE) IMPLANT
NEEDLE HYPO 25X1 1.5 SAFETY (NEEDLE) ×4 IMPLANT
NS IRRIG 500ML POUR BTL (IV SOLUTION) ×2 IMPLANT
PACK EXTREMITY ARMC (MISCELLANEOUS) ×2 IMPLANT
STOCKINETTE STRL 6IN 960660 (GAUZE/BANDAGES/DRESSINGS) ×2 IMPLANT
SUT ETHILON 3-0 FS-10 30 BLK (SUTURE)
SUT ETHILON 4-0 (SUTURE)
SUT ETHILON 4-0 FS2 18XMFL BLK (SUTURE)
SUT VIC AB 3-0 SH 27 (SUTURE)
SUT VIC AB 3-0 SH 27X BRD (SUTURE) ×1 IMPLANT
SUT VIC AB 4-0 FS2 27 (SUTURE) ×1 IMPLANT
SUTURE EHLN 3-0 FS-10 30 BLK (SUTURE) ×1 IMPLANT
SUTURE ETHLN 4-0 FS2 18XMF BLK (SUTURE) ×1 IMPLANT
SYR 10ML LL (SYRINGE) ×2 IMPLANT
SYR 3ML LL SCALE MARK (SYRINGE) ×4 IMPLANT
WND VAC CANISTER 500ML (MISCELLANEOUS) ×1 IMPLANT

## 2018-01-01 NOTE — Anesthesia Procedure Notes (Signed)
Procedure Name: LMA Insertion Performed by: Angelica Wix, Nelva Bush, CRNA Pre-anesthesia Checklist: Patient identified, Patient being monitored, Timeout performed, Emergency Drugs available and Suction available Patient Re-evaluated:Patient Re-evaluated prior to induction Oxygen Delivery Method: Circle system utilized Preoxygenation: Pre-oxygenation with 100% oxygen Induction Type: IV induction Ventilation: Mask ventilation without difficulty LMA: LMA inserted LMA Size: 4.5 Tube type: Oral Number of attempts: 1 Placement Confirmation: positive ETCO2 and breath sounds checked- equal and bilateral Tube secured with: Tape Dental Injury: Teeth and Oropharynx as per pre-operative assessment

## 2018-01-01 NOTE — Progress Notes (Signed)
At bedside report patient's root foot was noted to have bright red blood oozing from surgical site to heel. Wound vac is intact and working properly. Notified Dr.Troxler about bleeding and low hemoglobin level. Physician gave orders to stop wound vac for 30 minutes, reapply dressing, and a hemoglobin level,.

## 2018-01-01 NOTE — Progress Notes (Signed)
Pt's fsbs 472. Dr. Verdell Carmine notified. 15 units reg insulin given sq and dextrose 10 d/c'd.

## 2018-01-01 NOTE — Progress Notes (Signed)
Pt returned from surgery. Awake alert and oriented. C/o 8/10 pain in rt foot. Med with oxycontin. Denies any nausea. Meal ordered. Wound vac in place. Report received from leslie buchanen

## 2018-01-01 NOTE — Progress Notes (Signed)
[place] at [place] NAME: Shawn [name]    MR#:  [id number]  DATE OF BIRTH:  03-14-1986  SUBJECTIVE:   No further hypoglycemia or hypothermia overnight.  Patient is status post removal and excision debridement of the necrotic tissue of the right foot.  Pt. Seen in the Recovery area earlier post surgery. Complaining of some pain in right foot and also ongoing nausea.   REVIEW OF SYSTEMS:    Review of Systems  Constitutional: Positive for chills. Negative for fever.  HENT: Negative for congestion and tinnitus.   Eyes: Negative for blurred vision and double vision.  Respiratory: Negative for cough, shortness of breath and wheezing.   Cardiovascular: Negative for chest pain, orthopnea and PND.  Gastrointestinal: Positive for nausea. Negative for abdominal pain, diarrhea and vomiting.  Genitourinary: Negative for dysuria and hematuria.  Neurological: Negative for dizziness, sensory change and focal weakness.  All other systems reviewed and are negative.   Nutrition: Carb control/Heart Healthy Tolerating Diet: Yes Tolerating PT: Await Eval.     DRUG ALLERGIES:   Allergies  Allergen Reactions  . Banana Hives, Nausea And Vomiting and Rash  . Keflex [Cephalexin] Anaphylaxis  . Onion Hives, Nausea And Vomiting and Rash  . Sulfa Antibiotics Anaphylaxis  . Grapeseed Extract [Nutritional Supplements] Itching  . Shellfish Allergy Hives    "ALL SEAFOOD"    VITALS:  Blood pressure 120/88, pulse 91, temperature 98.5 F (36.9 C), temperature source Oral, resp. rate 16, height 5\' 6"  (1.676 m), weight 67 kg (147 lb 12.8 oz), SpO2 98 %.  PHYSICAL EXAMINATION:   Physical Exam  GENERAL:  32 y.o.-year-old patient lying in bed in NAD.  EYES: Pupils equal, round, reactive to light and accommodation. No scleral icterus. Extraocular muscles intact.  HEENT: Head atraumatic, normocephalic. Oropharynx and nasopharynx clear.  NECK:  Supple, no  jugular venous distention. No thyroid enlargement, no tenderness.  LUNGS: Normal breath sounds bilaterally, no wheezing, rales, rhonchi. No use of accessory muscles of respiration.  CARDIOVASCULAR: S1, S2 normal. No murmurs, rubs, or gallops.  ABDOMEN: Soft, nontender, nondistended. Bowel sounds present. No organomegaly or mass.  EXTREMITIES: No cyanosis, clubbing or edema b/l. Right foot dressing with wound vac in place post surgery.    NEUROLOGIC: Cranial nerves II through XII are intact. No focal Motor or sensory deficits b/l.   PSYCHIATRIC: The patient is alert and oriented x 3.  SKIN: No obvious rash, lesion, or ulcer.    LABORATORY PANEL:   CBC Recent Labs  Lab 12/31/17 0540  WBC 10.8*  HGB 7.6*  HCT 22.8*  PLT 577*   ------------------------------------------------------------------------------------------------------------------  Chemistries  Recent Labs  Lab 12/31/17 0540  NA 138  K 4.3  CL 110  CO2 22  GLUCOSE 93  BUN 23*  CREATININE 1.07  CALCIUM 8.1*  AST 81*  ALT 71*  ALKPHOS 167*  BILITOT 0.4   ------------------------------------------------------------------------------------------------------------------  Cardiac Enzymes No results for input(s): TROPONINI in the last 168 hours. ------------------------------------------------------------------------------------------------------------------  RADIOLOGY:  No results found.   ASSESSMENT AND PLAN:   32 year old male with past medical history of diabetes, chronic anemia, hypertension who was recently admitted to the hospital for sepsis secondary to right diabetic foot ulcer and discharge returns back secondary to worsening pain in the right foot.  1.  Right diabetic foot ulcer/cellulitis- continue broad-spectrum IV antibiotics with vancomycin and aztreonam, Flagyl.  -Followed by podiatry and status post debridement and removal of necrotic tissue in the OR today. -Continue  IV antibiotics and  supportive care as mentioned.  2.  Hypoglycemia/hypothermia- secondary to underlying infection.  Improved with dextrose drip and bear hugger. - resolved now.   3.  Essential hypertension-continue lisinopril.  4.  Depression - cont. Prozac.   5. Anemia - this is anemia of chronic disease.  - Hg. Stable and no need for transfusion and will monitor.   All the records are reviewed and case discussed with Care Management/Social Worker. Management plans discussed with the patient, family and they are in agreement.  CODE STATUS: Full code  DVT Prophylaxis: Hep. SQ  TOTAL TIME TAKING CARE OF THIS PATIENT: 25 minutes.   POSSIBLE D/C IN 3-4 DAYS, DEPENDING ON CLINICAL CONDITION.   Henreitta Leber M.D on 01/01/2018 at 2:13 PM  Between 7am to 6pm - Pager - [phone]  After 6pm go to [email or web address] - Technical brewer [place] [facility]  Office  [phone]  CC: Primary care physician; Valera Castle, MD

## 2018-01-01 NOTE — Anesthesia Postprocedure Evaluation (Signed)
Anesthesia Post Note  Patient: Shawn [name]  Procedure(s) Performed: IRRIGATION AND DEBRIDEMENT FOOT-SKIN,SOFT TISSUE AND BONE (Right Foot) APPLICATION OF WOUND VAC (Right Foot)  Patient location during evaluation: PACU Anesthesia Type: General Level of consciousness: awake and alert Pain management: pain level controlled Vital Signs Assessment: post-procedure vital signs reviewed and stable Respiratory status: spontaneous breathing, nonlabored ventilation, respiratory function stable and patient connected to nasal cannula oxygen Cardiovascular status: blood pressure returned to baseline and stable Postop Assessment: no apparent nausea or vomiting Anesthetic complications: no     Last Vitals:  Vitals:   01/01/18 1145 01/01/18 1226  BP: 111/86 120/88  Pulse: 85 91  Resp: 13 16  Temp:  36.9 C  SpO2: 99% 98%    Last Pain:  Vitals:   01/01/18 1226  TempSrc: Oral  PainSc: 9                  Martha Clan

## 2018-01-01 NOTE — Anesthesia Post-op Follow-up Note (Signed)
Anesthesia QCDR form completed.        

## 2018-01-01 NOTE — Progress Notes (Signed)
15 minute call to floor. 

## 2018-01-01 NOTE — Op Note (Signed)
Operative note   Surgeon: Dr. Albertine Patricia, DPM.    Assistant:None    Preop diagnosis: Necrotic skin soft tissue tendon and bone right foot    Postop diagnosis: Same    Procedure:   1.  Removal and excisional debridement of necrotic skin soft tissue tendons and fifth metatarsal bone   2.  Application of silver granular foam wound VAC at 125 mmHg continuous        EBL: 35 mL    Anesthesia:general delivered by the anesthesia team I used 18 cc of 0.5% Marcaine plain around the base of the lateral portion of the foot and forefoot in order to try to promote anesthesia    Hemostasis: None    Specimen: None    Complications: None    Operative indications: Nonhealing wound right foot.  The patient did have the fourth fifth rays amputated due to necrotic tissue last week and unfortunately he had a wound VAC put on.  He was readmitted to the hospital Friday with some increasing white blood cell count.  He had a wound VAC on last week.    Procedure:  Patient was brought into the OR and placed on the operating table in thesupine position. After anesthesia was obtained theright lower extremity was prepped and draped in usual sterile fashion.  Operative Report: At this time attention was directed to the right foot.  There was significant necrotic tissue noted along the skin margins as well as into the deeper tissues associated with the wound.  Wound itself is approximately 8 to 9 cm in length and about 6 cm in width with about 5 cm in depth going all the way down to the base of the metatarsals which are exposed.  Significant necrotic tissue was noted at all levels including an exposed fifth metatarsal shaft that was prominent I decided to go ahead and resect that with bone-cutting forceps.  I used a combination of a 15 scalpel blade along with a versa jet is at a level 3 to remove all necrotic tissue from the skin soft tissue fatty tissue and deeper tissues around the tendons.  2 of the long  extensor tendons were necrotic and I excised those areas.  Once I removed all necrotic tissue there was good bleeding across the region and seem to be clean at this point.  No areas of purulence or cellulitis were noted at this timeframe.  He had minimal cellulitis at the time of admission but his white count was elevated.  White count has reduced with IV antibiotics.  Still not certain the infection or elevation of the white count was a result of his foot.  At this time the area was irrigated with sterile saline.  A wound VAC was then placed across the area I elected not to put any sutures and to try to reduce any kind of stress on the tissues.  Wound VAC was a silver granular foam and set at 125 mmHg pressure continuous.  The area was turned on and the wound VAC seem to be working well with a good seal I did put some extra padding gauze around the area to try to reduce any skin pressure irritations.    Patient tolerated the procedure and anesthesia well.  Was transported from the OR to the PACU with all vital signs stable and vascular status intact. To be discharged per routine protocol.  Will follow up in approximately 1 week in the outpatient clinic.

## 2018-01-01 NOTE — H&P (Signed)
H and P has been reviewed and no changes are noted.  

## 2018-01-01 NOTE — Anesthesia Preprocedure Evaluation (Signed)
Anesthesia Evaluation  Patient identified by MRN, date of birth, ID band Patient awake    Reviewed: Allergy & Precautions, H&P , NPO status , Patient's Chart, lab work & pertinent test results  History of Anesthesia Complications Negative for: history of anesthetic complications  Airway Mallampati: II  TM Distance: >3 FB Neck ROM: full    Dental  (+) Chipped, Poor Dentition, Missing   Pulmonary neg pulmonary ROS, neg shortness of breath,           Cardiovascular Exercise Tolerance: Good (-) angina(-) Past MI and (-) DOE negative cardio ROS       Neuro/Psych PSYCHIATRIC DISORDERS Depression negative neurological ROS     GI/Hepatic Neg liver ROS, GERD  Medicated and Controlled,  Endo/Other  diabetes, Poorly Controlled, Type 2  Renal/GU      Musculoskeletal   Abdominal   Peds  Hematology negative hematology ROS (+)   Anesthesia Other Findings Past Medical History: No date: Diabetes mellitus without complication (HCC) No date: GERD (gastroesophageal reflux disease) No date: IBS (irritable bowel syndrome)  Past Surgical History: 12/20/2017: IRRIGATION AND DEBRIDEMENT FOOT; Right     Comment:  Procedure: IRRIGATION AND DEBRIDEMENT FOOT;  Surgeon:               Sharlotte Alamo, DPM;  Location: [facility] ORS;  Service:               Podiatry;  Laterality: Right;  BMI    Body Mass Index:  18.56 kg/m      Reproductive/Obstetrics negative OB ROS                             Anesthesia Physical  Anesthesia Plan  ASA: III  Anesthesia Plan: General   Post-op Pain Management: GA combined w/ Regional for post-op pain   Induction: Intravenous  PONV Risk Score and Plan: Ondansetron, Dexamethasone and Midazolam  Airway Management Planned: LMA  Additional Equipment:   Intra-op Plan:   Post-operative Plan: Extubation in OR  Informed Consent: I have reviewed the patients History and Physical,  chart, labs and discussed the procedure including the risks, benefits and alternatives for the proposed anesthesia with the patient or authorized representative who has indicated his/her understanding and acceptance.   Dental Advisory Given  Plan Discussed with: Anesthesiologist, CRNA and Surgeon  Anesthesia Plan Comments: (Patient consented for risks of anesthesia including but not limited to:  - adverse reactions to medications - damage to teeth, lips or other oral mucosa - sore throat or hoarseness - Damage to heart, brain, lungs or loss of life  Patient voiced understanding.)        Anesthesia Quick Evaluation

## 2018-01-01 NOTE — Transfer of Care (Signed)
Immediate Anesthesia Transfer of Care Note  Patient: Shawn [name]  Procedure(s) Performed: IRRIGATION AND DEBRIDEMENT FOOT-SKIN,SOFT TISSUE AND BONE (Right Foot) APPLICATION OF WOUND VAC (Right Foot)  Patient Location: PACU  Anesthesia Type:General  Level of Consciousness: awake, alert , oriented and patient cooperative  Airway & Oxygen Therapy: Patient Spontanous Breathing and Patient connected to nasal cannula oxygen  Post-op Assessment: Report given to RN, Post -op Vital signs reviewed and stable and Patient moving all extremities X 4  Post vital signs: Reviewed and stable  Last Vitals:  Vitals Value Taken Time  BP 95/58 01/01/2018 11:15 AM  Temp    Pulse 84 01/01/2018 11:15 AM  Resp 11 01/01/2018 11:15 AM  SpO2 98 % 01/01/2018 11:15 AM  Vitals shown include unvalidated device data.  Last Pain:  Vitals:   01/01/18 0800  TempSrc: Oral  PainSc: Asleep      Patients Stated Pain Goal: 5 (22/63/33 [id number])  Complications: No apparent anesthesia complications

## 2018-01-02 LAB — CREATININE, SERUM
Creatinine, Ser: 1.24 mg/dL (ref 0.61–1.24)
GFR calc Af Amer: >60 mL/min (ref >60)
GFR calc non Af Amer: >60 mL/min (ref >60)

## 2018-01-02 LAB — CBC
HCT: 21.4 % — ABNORMAL LOW (ref 40.0–52.0)
Hemoglobin: 7 g/dL — ABNORMAL LOW (ref 13.0–18.0)
MCH: 28 pg (ref 26.0–34.0)
MCHC: 32.7 g/dL (ref 32.0–36.0)
MCV: 85.5 fL (ref 80.0–100.0)
PLATELETS: 549 10*3/uL — AB (ref 150–440)
RBC: 2.5 MIL/uL — ABNORMAL LOW (ref 4.40–5.90)
RDW: 14.6 % — AB (ref 11.5–14.5)
WBC: 12.5 10*3/uL — AB (ref 3.8–10.6)

## 2018-01-02 LAB — VANCOMYCIN, TROUGH: VANCOMYCIN TR: 22 ug/mL — AB (ref 15–20)

## 2018-01-02 LAB — PREPARE RBC (CROSSMATCH)

## 2018-01-02 LAB — GLUCOSE, CAPILLARY: Glucose-Capillary: 204 mg/dL — ABNORMAL HIGH (ref 70–99)

## 2018-01-02 MED ORDER — VANCOMYCIN HCL IN DEXTROSE 1-5 GM/200ML-% IV SOLN: 1000.0000 mg | INTRAVENOUS | Status: DC

## 2018-01-02 MED ORDER — SODIUM CHLORIDE 0.9% IV SOLUTION
Freq: Once | INTRAVENOUS | Status: AC
  Administered 2018-01-02: 16:00:00 via INTRAVENOUS

## 2018-01-02 MED ORDER — CIPROFLOXACIN HCL 500 MG PO TABS
750.0000 mg | ORAL_TABLET | Freq: Two times a day (BID) | ORAL | Status: DC
  Administered 2018-01-02: 750 mg via ORAL
  Filled 2018-01-02 (×2): qty 1.5

## 2018-01-02 MED ORDER — DOXYCYCLINE HYCLATE 100 MG PO TABS
100.0000 mg | ORAL_TABLET | Freq: Two times a day (BID) | ORAL | Status: DC
  Administered 2018-01-02 (×2): 100 mg via ORAL
  Filled 2018-01-02 (×2): qty 1

## 2018-01-02 MED ORDER — INSULIN GLARGINE 100 UNIT/ML ~~LOC~~ SOLN
25.0000 [IU] | Freq: Every day | SUBCUTANEOUS | Status: DC
  Administered 2018-01-02: 25 [IU] via SUBCUTANEOUS
  Filled 2018-01-02 (×2): qty 0.25

## 2018-01-02 NOTE — Progress Notes (Signed)
Discussed the case with Dr. Baxter Flattery from infectious disease over the phone.  -Change antibiotics from IV vancomycin aztreonam and Flagyl to oral doxycycline and ciprofloxacin for now.

## 2018-01-02 NOTE — Progress Notes (Signed)
PT Cancellation Note  Patient Details Name: Shawn [name] MRN: [id number] DOB: [birth year]   Cancelled Treatment:    Reason Eval/Treat Not Completed: Medical issues which prohibited therapy(Hgb 7.0).  Per RN the pt is scheduled to have 1 unit transfusion this morning.  Will attempt to see pt again later today, schedule permitting.    Collie Siad PT, DPT 01/02/2018, 9:27 AM

## 2018-01-02 NOTE — Progress Notes (Signed)
Received pt from 241 via bed. Pt is awake and approp. Connected to tele monitor, vss, medicated per mar. Oriented to room, call bell in reach, instructed to call for needs. Rt foot elevated on 2 pillows. Urinal provided.

## 2018-01-02 NOTE — Care Management Note (Signed)
Case Management Note  Patient Details  Name: Shawn [name] MRN: [id number] Date of Birth: [birth year]  Subjective/Objective:   [facility] followed by [facility] for nursing and wound vac care. He receives charity care with [facility] because is copay is greater than $500 per month. Please review previous case managers notes. [facility] was to open him for PT and SW but patient was readmitted before services could begin.                 Action/Plan: RNCM to follow for home care needs  Expected Discharge Date:                  Expected Discharge Plan:     In-House Referral:     Discharge planning Services  CM Consult  Post Acute Care Choice:  Home Health, Resumption of Svcs/PTA Provider Choice offered to:     DME Arranged:    DME Agency:     HH Arranged:  RN, PT, Social Work [employer] Agency:  [place]  Status of Service:  In process, will continue to follow  If discussed at Long Length of Stay Meetings, dates discussed:    Additional Comments:  Jolly Mango, RN 01/02/2018, 3:18 PM

## 2018-01-02 NOTE — Progress Notes (Signed)
No events since arrival to this floor. Slept in short intervals. Rt foot elevated on 2 pillows, wound vac intact, piv intact. Medicated per mar. Call bell in reach.

## 2018-01-02 NOTE — Progress Notes (Signed)
[place] at [place] NAME: Shawn [name]    MR#:  [id number]  DATE OF BIRTH:  January 17, 1986  SUBJECTIVE:   he is still complaining of pain on his right foot.  He also has intermittent nausea but tolerating p.o. Well.  s/p debridement of the right diabetic foot ulcer with a wound VAC placement postop day #1 today.  REVIEW OF SYSTEMS:    Review of Systems  Constitutional: Positive for chills. Negative for fever.  HENT: Negative for congestion and tinnitus.   Eyes: Negative for blurred vision and double vision.  Respiratory: Negative for cough, shortness of breath and wheezing.   Cardiovascular: Negative for chest pain, orthopnea and PND.  Gastrointestinal: Positive for nausea. Negative for abdominal pain, diarrhea and vomiting.  Genitourinary: Negative for dysuria and hematuria.  Neurological: Negative for dizziness, sensory change and focal weakness.  All other systems reviewed and are negative.   Nutrition: Carb control/Heart Healthy Tolerating Diet: Yes Tolerating PT: Await Eval.     DRUG ALLERGIES:   Allergies  Allergen Reactions  . Banana Hives, Nausea And Vomiting and Rash  . Keflex [Cephalexin] Anaphylaxis  . Onion Hives, Nausea And Vomiting and Rash  . Sulfa Antibiotics Anaphylaxis  . Grapeseed Extract [Nutritional Supplements] Itching  . Shellfish Allergy Hives    "ALL SEAFOOD"    VITALS:  Blood pressure (!) 130/95, pulse 91, temperature 98.1 F (36.7 C), resp. rate 18, height 5\' 6"  (1.676 m), weight 67.2 kg (148 lb 3.2 oz), SpO2 98 %.  PHYSICAL EXAMINATION:   Physical Exam  GENERAL:  32 y.o.-year-old patient lying in bed in NAD.  EYES: Pupils equal, round, reactive to light and accommodation. No scleral icterus. Extraocular muscles intact.  HEENT: Head atraumatic, normocephalic. Oropharynx and nasopharynx clear.  NECK:  Supple, no jugular venous distention. No thyroid enlargement, no tenderness.  LUNGS: Normal  breath sounds bilaterally, no wheezing, rales, rhonchi. No use of accessory muscles of respiration.  CARDIOVASCULAR: S1, S2 normal. No murmurs, rubs, or gallops.  ABDOMEN: Soft, nontender, nondistended. Bowel sounds present. No organomegaly or mass.  EXTREMITIES: No cyanosis, clubbing or edema b/l. Right foot dressing with wound vac in place post surgery.    NEUROLOGIC: Cranial nerves II through XII are intact. No focal Motor or sensory deficits b/l.   PSYCHIATRIC: The patient is alert and oriented x 3.  SKIN: No obvious rash, lesion, or ulcer.    LABORATORY PANEL:   CBC Recent Labs  Lab 01/02/18 0539  WBC 12.5*  HGB 7.0*  HCT 21.4*  PLT 549*   ------------------------------------------------------------------------------------------------------------------  Chemistries  Recent Labs  Lab 12/31/17 0540 01/02/18 0743  NA 138  --   K 4.3  --   CL 110  --   CO2 22  --   GLUCOSE 93  --   BUN 23*  --   CREATININE 1.07 1.24  CALCIUM 8.1*  --   AST 81*  --   ALT 71*  --   ALKPHOS 167*  --   BILITOT 0.4  --    ------------------------------------------------------------------------------------------------------------------  Cardiac Enzymes No results for input(s): TROPONINI in the last 168 hours. ------------------------------------------------------------------------------------------------------------------  RADIOLOGY:  No results found.   ASSESSMENT AND PLAN:   32 year old male with past medical history of diabetes, chronic anemia, hypertension who was recently admitted to the hospital for sepsis secondary to right diabetic foot ulcer and discharge returns back secondary to worsening pain in the right foot.  1.  Right diabetic foot ulcer/cellulitis-  continue broad-spectrum IV antibiotics with vancomycin and aztreonam, Flagyl.  -Followed by podiatry and status post debridement and removal of necrotic tissue in the OR POD # 1. - will discuss with ID regarding narrowing  abx. Coverage.   2.  Hypoglycemia/hypothermia- secondary to underlying infection.  Improved with dextrose drip and bear hugger. - resolved now.   3.  Essential hypertension-continue lisinopril.  4.  Depression - cont. Prozac.   5. Anemia - this is anemia of chronic disease combined with acute blood loss anemia from recent surgery.  - Hg. Down to 7.0 today and will given one unit of blood and follow Hg.   All the records are reviewed and case discussed with Care Management/Social Worker. Management plans discussed with the patient, family and they are in agreement.  CODE STATUS: Full code  DVT Prophylaxis: Hep. SQ  TOTAL TIME TAKING CARE OF THIS PATIENT: 30 minutes.   POSSIBLE D/C IN 3-4 DAYS, DEPENDING ON CLINICAL CONDITION.   Henreitta Leber M.D on 01/02/2018 at 1:16 PM  Between 7am to 6pm - Pager - [phone]  After 6pm go to [email or web address] - Technical brewer [place] [facility]  Office  [phone]  CC: Primary care physician; Valera Castle, MD

## 2018-01-02 NOTE — Consult Note (Signed)
Pharmacy Antibiotic Note  Shawn [name] is a 32 y.o. male admitted on 12/29/2017 with worsening right foot infection. 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    Pharmacy has been consulted for Vancomycin, aztreonam, and metronidazole dosing.   Plan: 01/02/18 10:02 vancomycin trough 22 mcg/ml, drawn appropriately. Recalculated Ke 0.047 hr-1. Decrease to vancomycin 1 gm IV Q24H, predicted trough 16 mcg/ml. Pharmacy will continue to follow and adjust as needed to maintain trough 15 to 20 mcg/ml.   Continue Aztreonam 2g IV every 8 hours.   Continue Metronidazole 500 mg IV every 8 hours.    Height: 5\' 6"  (167.6 cm) Weight: 148 lb 3.2 oz (67.2 kg) IBW/kg (Calculated) : 63.8  Temp (24hrs), Avg:98.3 F (36.8 C), Min:97.9 F (36.6 C), Max:98.8 F (37.1 C)  Recent Labs  Lab 12/27/17 0425 12/29/17 2039 12/31/17 0540 12/31/17 1925 01/02/18 0539 01/02/18 0743  WBC  --  15.8* 10.8*  --  12.5*  --   CREATININE 1.08 1.34* 1.07  --   --  1.24  LATICACIDVEN  --  1.8  --   --   --   --   VANCOTROUGH  --   --   --  19  --  22*    Estimated Creatinine Clearance: 78.6 mL/min (by C-G formula based on SCr of 1.24 mg/dL).    Allergies  Allergen Reactions  . Banana Hives, Nausea And Vomiting and Rash  . Keflex [Cephalexin] Anaphylaxis  . Onion Hives, Nausea And Vomiting and Rash  . Sulfa Antibiotics Anaphylaxis  . Grapeseed Extract [Nutritional Supplements] Itching  . Shellfish Allergy Hives    "ALL SEAFOOD"    Antimicrobials this admission: 6/27 Aztreonam >>  6/27 Vancomycin >>  6/27 Metronidazole>>   Dose adjustments this admission:  Microbiology results: 6/27 BCx: NGTD   Thank you for allowing pharmacy to be a part of this patient's care.  Laural Benes, PharmD, BCPS Clinical Pharmacist 01/02/2018 10:02 AM

## 2018-01-02 NOTE — Progress Notes (Signed)
[facility]                                                      Patient Demographics  Shawn [name], is a 32 y.o. male   MRN: [id number]   DOB - [birth year]  Admit Date - 12/29/2017    Outpatient Primary MD for the patient is Olmedo, Guy Begin, MD  Consult requested in the Hospital by Henreitta Leber, MD, On 01/02/2018  With History of -  Past Medical History:  Diagnosis Date  . Diabetes mellitus without complication ([place])   . GERD (gastroesophageal reflux disease)   . IBS (irritable bowel syndrome)       Past Surgical History:  Procedure Laterality Date  . ABDOMINAL AORTOGRAM W/LOWER EXTREMITY Right 12/23/2017   Procedure: ABDOMINAL AORTOGRAM W/LOWER EXTREMITY;  Surgeon: Katha Cabal, MD;  Location: [facility];  Service: Cardiovascular;  Laterality: Right;  . AMPUTATION Right 12/24/2017   Procedure: AMPUTATION RAY;  Surgeon: Sharlotte Alamo, DPM;  Location: [facility];  Service: Podiatry;  Laterality: Right;  . APPLICATION OF WOUND VAC Right 12/24/2017   Procedure: APPLICATION OF WOUND VAC;  Surgeon: Sharlotte Alamo, DPM;  Location: [facility];  Service: Podiatry;  Laterality: Right;  . APPLICATION OF WOUND VAC Right 01/01/2018   Procedure: APPLICATION OF WOUND VAC;  Surgeon: Albertine Patricia, DPM;  Location: [facility];  Service: Podiatry;  Laterality: Right;  . IRRIGATION AND DEBRIDEMENT FOOT Right 12/20/2017   Procedure: IRRIGATION AND DEBRIDEMENT FOOT;  Surgeon: Sharlotte Alamo, DPM;  Location: [facility];  Service: Podiatry;  Laterality: Right;  . IRRIGATION AND DEBRIDEMENT FOOT Right 12/24/2017   Procedure: IRRIGATION AND DEBRIDEMENT FOOT;  Surgeon: Sharlotte Alamo, DPM;  Location: [facility];  Service: Podiatry;  Laterality: Right;  . IRRIGATION AND DEBRIDEMENT FOOT Right 01/01/2018   Procedure: IRRIGATION AND DEBRIDEMENT FOOT-SKIN,SOFT TISSUE AND BONE;   Surgeon: Albertine Patricia, DPM;  Location: [facility];  Service: Podiatry;  Laterality: Right;    in for   Chief Complaint  Patient presents with  . Post-op Problem     HPI  Shawn [name]  is a 32 y.o. male, patient is 1 day status post debridement of necrotic skin soft tissue and bone to the right foot.  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  He was readmitted to the hospital on Friday  and I did surgery on him on Sunday morning.  Surgery was debridement removal with combination of sharp dissection and excision as well as excision with versa jet.    Review of Systems    In addition to the HPI above,  No Fever-chills, No Headache, No changes with Vision or hearing, No problems swallowing food or Liquids, No Chest pain, Cough or Shortness of Breath, No Abdominal pain, No Nausea or Vommitting, Bowel movements are regular, No Blood in stool or Urine, No dysuria, No new skin rashes or bruises, No new joints pains-aches,  No new weakness, tingling, numbness in any extremity, No recent weight gain or loss, No polyuria, polydypsia or polyphagia, No significant Mental Stressors.  A full 10 point Review of Systems was done, except as stated above, all other Review of Systems were negative.   Social History Social History   Tobacco Use  . Smoking  status: Never Smoker  . Smokeless tobacco: Never Used  Substance Use Topics  . Alcohol use: No    Frequency: Never    Family History Family History  Problem Relation Age of Onset  . Diabetes Mother   . Ovarian cancer Mother     Prior to Admission medications   Medication Sig Start Date End Date Taking? Authorizing Provider  ALPRAZolam (XANAX) 0.25 MG tablet Take 1 tablet (0.25 mg total) by mouth 3 (three) times daily as needed for anxiety. 12/27/17  Yes Wieting, Richard, MD  doxycycline (VIBRA-TABS) 100 MG tablet Take 1 tablet (100 mg total) by mouth 2  (two) times daily with a meal for 14 days. 12/26/17 01/09/18 Yes Wieting, Richard, MD  FLUoxetine (PROZAC) 20 MG capsule Take 1 capsule (20 mg total) by mouth daily. 11/12/17  Yes Gladstone Lighter, MD  insulin glargine (LANTUS) 100 UNIT/ML injection Inject 22 Units into the skin daily.    Yes [provider]  lisinopril (PRINIVIL,ZESTRIL) 10 MG tablet Take 1 tablet (10 mg total) by mouth daily. 11/12/17  Yes Gladstone Lighter, MD  NOVOLIN R RELION 100 UNIT/ML injection Inject 8 Units into the skin 3 (three) times daily with meals. 12/27/17  Yes [provider]  oxyCODONE-acetaminophen (PERCOCET/ROXICET) 5-325 MG tablet Take 1 tablet by mouth every 6 (six) hours as needed for moderate pain. 12/26/17  Yes Loletha Grayer, MD  promethazine (PHENERGAN) 12.5 MG tablet Take 1 tablet (12.5 mg total) by mouth every 6 (six) hours as needed for nausea or vomiting. 12/26/17  Yes Wieting, Richard, MD  feeding supplement, GLUCERNA SHAKE, (GLUCERNA SHAKE) LIQD Take 237 mLs by mouth 3 (three) times daily between meals. 12/27/17   Loletha Grayer, MD  ferrous sulfate 325 (65 FE) MG tablet Take 1 tablet (325 mg total) by mouth 2 (two) times daily with a meal. Patient not taking: Reported on 12/30/2017 12/26/17   Loletha Grayer, MD  insulin aspart (NOVOLOG) 100 UNIT/ML injection Inject 8 Units into the skin 3 (three) times daily with meals. Patient not taking: Reported on 12/29/2017 12/26/17   Loletha Grayer, MD  Insulin Syringes, Disposable, U-100 0.3 ML MISC 1 Syringe by Does not apply route 4 (four) times daily -  with meals and at bedtime. 12/27/17   Loletha Grayer, MD  metoCLOPramide (REGLAN) 10 MG tablet Take 1 tablet (10 mg total) by mouth 3 (three) times daily before meals. Patient not taking: Reported on 12/30/2017 12/26/17   Loletha Grayer, MD  polyethylene glycol [facility]) packet Take 17 g by mouth daily as needed for moderate constipation. 12/26/17   Loletha Grayer, MD    Anti-infectives  (From admission, onward)   Start     Dose/Rate Route Frequency Ordered Stop   12/30/17 2000  vancomycin (VANCOCIN) IVPB 750 mg/150 ml premix     750 mg 150 mL/hr over 60 Minutes Intravenous Every 12 hours 12/30/17 1140     12/30/17 0800  vancomycin (VANCOCIN) IVPB 750 mg/150 ml premix  Status:  Discontinued     750 mg 150 mL/hr over 60 Minutes Intravenous Every 18 hours 12/29/17 2201 12/30/17 1140   12/30/17 0600  aztreonam (AZACTAM) 2 g in sodium chloride 0.9 % 100 mL IVPB     2 g 200 mL/hr over 30 Minutes Intravenous Every 8 hours 12/29/17 2201     12/30/17 0600  metroNIDAZOLE (FLAGYL) IVPB 500 mg     500 mg 100 mL/hr over 60 Minutes Intravenous Every 8 hours 12/29/17 2201     12/29/17 2045  aztreonam (AZACTAM) 2 g in sodium chloride 0.9 % 100 mL IVPB     2 g 200 mL/hr over 30 Minutes Intravenous  Once 12/29/17 2030 12/29/17 2145   12/29/17 2045  metroNIDAZOLE (FLAGYL) IVPB 500 mg     500 mg 100 mL/hr over 60 Minutes Intravenous  Once 12/29/17 2030 12/29/17 2225   12/29/17 2045  vancomycin (VANCOCIN) IVPB 1000 mg/200 mL premix     1,000 mg 200 mL/hr over 60 Minutes Intravenous  Once 12/29/17 2030 12/29/17 2304      Scheduled Meds: . docusate sodium  100 mg Oral BID  . feeding supplement (GLUCERNA SHAKE)  237 mL Oral TID BM  . ferrous sulfate  325 mg Oral BID WC  . FLUoxetine  20 mg Oral Daily  . heparin  5,000 Units Subcutaneous Q8H  . insulin aspart  0-15 Units Subcutaneous TID WC  . insulin glargine  30 Units Subcutaneous QHS  . lisinopril  10 mg Oral Daily  . metoCLOPramide  10 mg Oral TID AC  . ondansetron (ZOFRAN) IV  4 mg Intravenous Once  . oxyCODONE  10 mg Oral Q12H   Continuous Infusions: . aztreonam Stopped (01/02/18 0533)  . metronidazole Stopped (01/02/18 0645)  . vancomycin Stopped (01/01/18 2130)   PRN Meds:.acetaminophen **OR** acetaminophen, ALPRAZolam, ondansetron **OR** ondansetron (ZOFRAN) IV, oxyCODONE-acetaminophen, polyethylene glycol,  promethazine  Allergies  Allergen Reactions  . Banana Hives, Nausea And Vomiting and Rash  . Keflex [Cephalexin] Anaphylaxis  . Onion Hives, Nausea And Vomiting and Rash  . Sulfa Antibiotics Anaphylaxis  . Grapeseed Extract [Nutritional Supplements] Itching  . Shellfish Allergy Hives    "ALL SEAFOOD"    Physical Exam  Vitals  Blood pressure 118/82, pulse 98, temperature 98.1 F (36.7 C), temperature source Oral, resp. rate 18, height 5\' 6"  (1.676 m), weight 67.2 kg (148 lb 3.2 oz), SpO2 98 %.  Lower Extremity exam: Patient had gotten up and walked on his foot last night this caused some increased bleeding and saturation of the bandage but likely the wound VAC stayed in place.  The nurse called last night and had a redressed the bandage.  He has not had an episode since then.  Bandages clean dry and intact this morning.  Wound VAC is functional at 75 mmhg  No evidence of redness or cellulitis is noted to the foot.  Data Review  CBC Recent Labs  Lab 12/29/17 2039 12/31/17 0540 01/01/18 2028 01/02/18 0539  WBC 15.8* 10.8*  --  12.5*  HGB 7.7* 7.6* 7.7* 7.0*  HCT 23.4* 22.8*  --  21.4*  PLT 609* 577*  --  549*  MCV 86.7 85.7  --  85.5  MCH 28.3 28.7  --  28.0  MCHC 32.7 33.5  --  32.7  RDW 14.8* 14.5  --  14.6*  LYMPHSABS 2.3  --   --   --   MONOABS 1.3*  --   --   --   EOSABS 0.2  --   --   --   BASOSABS 0.1  --   --   --    ------------------------------------------------------------------------------------------------------------------  Chemistries  Recent Labs  Lab 12/27/17 0425 12/29/17 2039 12/31/17 0540  NA 138 136 138  K 3.9 3.4* 4.3  CL 104 103 110  CO2 28 23 22   GLUCOSE 196* 425* 93  BUN 19 26* 23*  CREATININE 1.08 1.34* 1.07  CALCIUM 8.0* 8.1* 8.1*  AST  --  23 81*  ALT  --  20  71*  ALKPHOS  --  90 167*  BILITOT  --  0.3 0.4    ------------------------------------------------------------------------------------------------------------------  Urinalysis    Component Value Date/Time   COLORURINE AMBER (A) 12/18/2017 1321   APPEARANCEUR CLOUDY (A) 12/18/2017 1321   APPEARANCEUR Clear 10/06/2014 0758   LABSPEC 1.014 12/18/2017 1321   LABSPEC 1.031 10/06/2014 0758   PHURINE 5.0 12/18/2017 1321   GLUCOSEU >=500 (A) 12/18/2017 1321   GLUCOSEU >=500 10/06/2014 0758   HGBUR SMALL (A) 12/18/2017 1321   BILIRUBINUR NEGATIVE 12/18/2017 1321   BILIRUBINUR Negative 10/06/2014 0758   KETONESUR NEGATIVE 12/18/2017 1321   PROTEINUR 100 (A) 12/18/2017 1321   UROBILINOGEN 0.2 03/29/2007 1506   NITRITE NEGATIVE 12/18/2017 1321   LEUKOCYTESUR NEGATIVE 12/18/2017 1321   LEUKOCYTESUR Negative 10/06/2014 0758    Assessment & Plan: The patient has stabilized with his foot he is afebrile and white cell counts within normal limits.  Blood sugars are still somewhat volatile but he is feeling better from overall standpoint.  As noted he got up and put weight on his foot last night because the had diarrhea.  I explained to him today that he simply cannot put weight on his foot it will cause of the bleeding but it also disrupt the healing process and disrupt the wound VAC.  Once again I saw no evidence of infection yesterday time of surgery I see no evidence of infection in the region today.  I recommend that we keep in the hospital for another day will change his wound VAC tomorrow I will get another culture at that time.  The culture did Friday apparently was thrown away before was run.  As noted he does not appear to have any infection in his foot.  He felt like his elevated white count was likely for another reason when he was returned to the OR.  This could have been a response to the necrotic tissue on his foot however.  Active Problems:   Cellulitis   Family Communication: Plan discussed with patient   Albertine Patricia M.D on  01/02/2018 at 7:56 AM  Thank you for the consult, we will follow the patient with you in the Hospital.

## 2018-01-02 NOTE — Progress Notes (Signed)
Inpatient Diabetes Program Recommendations  AACE/ADA: New Consensus Statement on Inpatient Glycemic Control (2015)  Target Ranges:  Prepandial:   less than 140 mg/dL      Peak postprandial:   less than 180 mg/dL (1-2 hours)      Critically ill patients:  140 - 180 mg/dL   Lab Results  Component Value Date   GLUCAP 369 (H) 01/01/2018   HGBA1C 16.9 (H) 12/19/2017    Review of Glycemic ControlResults for Shawn [name], Shawn [name] (MRN [id number]) as of 01/02/2018 12:22  Ref. Range 01/01/2018 07:58 01/01/2018 11:21 01/01/2018 17:25 01/01/2018 21:32  Glucose-Capillary Latest Ref Range: 70 - 99 mg/dL 215 (H) 149 (H) 472 (H) 369 (H)    Diabetes history: Type 1 Dm  Outpatient Diabetes medications: Lantus 30 units daily, Regular insulin 8 units tid with meals (patient not taking meal coverage) Current orders for Inpatient glycemic control:  Lantus 30 units q HS, Novolog moderate tid with meals  Inpatient Diabetes Program Recommendations:    Note that patient did not get basal insulin Lantus on 6/29 due to hypoglycemia.  Blood sugars markedly increased on 6/30 after receiving decadron 4 mg x1.    May consider reducing Lantus to 25 units q HS.  Also consider reducing Novolog correction to sensitive tid with meals.  He would benefit from the addition of Novolog meal coverage 4 units tid with meals.    Thanks, Adah Perl, RN, BC-ADM Inpatient Diabetes Coordinator Pager [phone] (8a-5p)

## 2018-01-03 LAB — TYPE AND SCREEN
ABO/RH(D): A NEG
Antibody Screen: NEGATIVE
UNIT DIVISION: 0

## 2018-01-03 LAB — GLUCOSE, CAPILLARY
Glucose-Capillary: 247 mg/dL — ABNORMAL HIGH (ref 70–99)
Glucose-Capillary: 148 mg/dL — ABNORMAL HIGH (ref 70–99)
Glucose-Capillary: 167 mg/dL — ABNORMAL HIGH (ref 70–99)
Glucose-Capillary: 105 mg/dL — ABNORMAL HIGH (ref 70–99)
Glucose-Capillary: 59 mg/dL — ABNORMAL LOW (ref 70–99)

## 2018-01-03 LAB — CULTURE, BLOOD (ROUTINE X 2)
CULTURE: NO GROWTH
Culture: NO GROWTH

## 2018-01-03 LAB — CBC
HCT: 26.6 % — ABNORMAL LOW (ref 40.0–52.0)
Hemoglobin: 9 g/dL — ABNORMAL LOW (ref 13.0–18.0)
MCH: 28.5 pg (ref 26.0–34.0)
MCHC: 33.7 g/dL (ref 32.0–36.0)
MCV: 84.4 fL (ref 80.0–100.0)
PLATELETS: 547 10*3/uL — AB (ref 150–440)
RBC: 3.15 MIL/uL — ABNORMAL LOW (ref 4.40–5.90)
RDW: 15.1 % — AB (ref 11.5–14.5)
WBC: 8.1 10*3/uL (ref 3.8–10.6)

## 2018-01-03 LAB — BPAM RBC
Blood Product Expiration Date: 201907172359
ISSUE DATE / TIME: 201907011605
UNIT TYPE AND RH: 600

## 2018-01-03 MED ORDER — FERROUS SULFATE 325 (65 FE) MG PO TABS: 325.0000 mg | ORAL_TABLET | Freq: Two times a day (BID) | ORAL | Status: DC

## 2018-01-03 MED ORDER — CIPROFLOXACIN HCL 500 MG PO TABS: 500.0000 mg | ORAL_TABLET | Freq: Two times a day (BID) | ORAL | 0 refills | Status: AC

## 2018-01-03 MED ORDER — DOXYCYCLINE HYCLATE 100 MG PO TABS: 100.0000 mg | ORAL_TABLET | Freq: Two times a day (BID) | ORAL | Status: DC

## 2018-01-03 MED ORDER — OXYCODONE-ACETAMINOPHEN 5-325 MG PO TABS: 1.0000 | ORAL_TABLET | Freq: Four times a day (QID) | ORAL | 0 refills | Status: DC | PRN

## 2018-01-03 MED ORDER — FERROUS SULFATE 325 (65 FE) MG PO TABS: 325.0000 mg | ORAL_TABLET | Freq: Two times a day (BID) | ORAL | 0 refills | Status: DC

## 2018-01-03 MED ORDER — CIPROFLOXACIN HCL 500 MG PO TABS
500.0000 mg | ORAL_TABLET | Freq: Two times a day (BID) | ORAL | Status: DC
  Administered 2018-01-03: 500 mg via ORAL
  Filled 2018-01-03 (×2): qty 1

## 2018-01-03 NOTE — Progress Notes (Signed)
Right foot dressing changed by Dr. Elvina Mattes at bedside. Swab for culture taken. Wound vac discontinued by Dr. Elvina Mattes. Pt's father will be bringing the supplies needed to reapply wound vac for home use. Pt will let RN know once his father gets here. PRN Xanax given PO after the procedure. Will continue to monitor.

## 2018-01-03 NOTE — Evaluation (Signed)
Physical Therapy Evaluation Patient Details Name: Shawn [name] MRN: [id number] DOB: 1985-11-01 Today's Date: 01/03/2018   History of Present Illness  32 y/o male who was here 2 weeks ago with R LE cellulitis and ultimately needed a 4th&5th ray amputation 6/22, he went home but returns with worsening cellulitis, had excision/debridment 6/30.  PMH: DM, GERD, IBS.  Clinical Impression  Pt did well with bed mobility and sitting balance showing good confidence and no need for assist.  In standing he was initially confident and safe, however he did fatigue quickly with relatively limited bout of ambulation/gait training (~10 minutes apart from the exam) and struggled to safely get back to bed after 60 ft of ambulation with walker - he was able to maintain NWBing on the R t/o the effort.  Pt will be safe to return home with 24/7 supervision - states he had HHPT (?) set up after admission last week, would benefit from continued PT services.    Follow Up Recommendations Follow surgeon's recommendation for DC plan and follow-up therapies    Equipment Recommendations  None recommended by PT    Recommendations for Other Services       Precautions / Restrictions Precautions Precautions: None Restrictions RLE Weight Bearing: Non weight bearing      Mobility  Bed Mobility Overal bed mobility: Independent             General bed mobility comments: pt performs bed mobility safely and independently  Transfers Overall transfer level: Modified independent Equipment used: Rolling walker (2 wheeled);Crutches             General transfer comment: Cuing for hand placement, general set up and reminders to insure [place] on R (orthowedge shoe donned)  Ambulation/Gait Ambulation/Gait assistance: Counsellor (Feet): 60 Feet Assistive device: Rolling walker (2 wheeled)       General Gait Details: Pt showed good effort and was initially able to do relatively well with walker,  however b/l UEs and L LE fatigued very quickly and he was very tired by the time we got back to his room/bed.  Pt able to maintain NWBing on R but with great effort, understands he needs to build up tolerance before he goes out too much.    Stairs            Wheelchair Mobility    Modified Rankin (Stroke Patients Only)       Balance Overall balance assessment: Needs assistance   Sitting balance-Leahy Scale: Normal       Standing balance-Leahy Scale: Fair Standing balance comment: Pt highly reliant on the walker, good static balance that decreases quickly with dynamic tasks                             Pertinent Vitals/Pain Pain Assessment: 0-10 Pain Score: 4 (reports he has his baseline R foot pain, nothing new)    Home Living Family/patient expects to be discharged to:: Private residence Living Arrangements: Parent;Children Available Help at Discharge: Family Type of Home: House Home Access: Stairs to enter Entrance Stairs-Rails: None Entrance Stairs-Number of Steps: 1 Home Layout: One level Home Equipment: Walker - 2 wheels;Grab bars - tub/shower Additional Comments: seems he has access to w/c    Prior Function Level of Independence: Independent         Comments: 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, has been [place] and  needing walker since admission     Hand Dominance        Extremity/Trunk Assessment   Upper Extremity Assessment Upper Extremity Assessment: Generalized weakness(weaker than expected for age )    Lower Extremity Assessment Lower Extremity Assessment: Generalized weakness(grossly 4/5 t/o except b/l ankle DF does not get to neutral)       Communication   Communication: No difficulties  Cognition Arousal/Alertness: Awake/alert Behavior During Therapy: WFL for tasks assessed/performed Overall Cognitive Status: Within Functional Limits for tasks assessed                                         General Comments      Exercises     Assessment/Plan    PT Assessment Patient needs continued PT services  PT Problem List Decreased strength;Decreased range of motion;Decreased mobility;Decreased balance;Decreased activity tolerance;Decreased knowledge of use of DME;Decreased safety awareness;Pain       PT Treatment Interventions DME instruction;Gait training;Stair training;Functional mobility training;Therapeutic activities;Therapeutic exercise;Balance training;Patient/family education    PT Goals (Current goals can be found in the Care Plan section)  Acute Rehab PT Goals Patient Stated Goal: get back home and back to normal life PT Goal Formulation: With patient Time For Goal Achievement: 01/17/18 Potential to Achieve Goals: Good    Frequency Min 2X/week   Barriers to discharge        Co-evaluation               AM-PAC PT "6 Clicks" Daily Activity  Outcome Measure Difficulty turning over in bed (including adjusting bedclothes, sheets and blankets)?: None Difficulty moving from lying on back to sitting on the side of the bed? : None Difficulty sitting down on and standing up from a chair with arms (e.g., wheelchair, bedside commode, etc,.)?: A Little Help needed moving to and from a bed to chair (including a wheelchair)?: None Help needed walking in hospital room?: A Little Help needed climbing 3-5 steps with a railing? : A Little 6 Click Score: 21    End of Session Equipment Utilized During Treatment: Gait belt Activity Tolerance: Patient tolerated treatment well;Patient limited by pain     PT Visit Diagnosis: Unsteadiness on feet (R26.81);Other abnormalities of gait and mobility (R26.89);Muscle weakness (generalized) (M62.81);Difficulty in walking, not elsewhere classified (R26.2);Pain Pain - Right/Left: Right Pain - part of body: Ankle and joints of foot    Time: 2993-7169 PT Time Calculation (min) (ACUTE ONLY): 29  min   Charges:   PT Evaluation $PT Eval Low Complexity: 1 Low PT Treatments $Gait Training: 8-22 mins   PT G Codes:        Kreg Shropshire, DPT 01/03/2018, 10:21 AM

## 2018-01-03 NOTE — Progress Notes (Addendum)
Patient is been discharged home. discharge instruction were given to patient and family. Home health has already been set up for this patient. Home wound vac has been connected before discharge

## 2018-01-03 NOTE — Consult Note (Signed)
Pharmacy Antibiotic Note  Shawn [name] is a 32 y.o. male admitted on 12/29/2017 with worsening right foot infection. 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    Pharmacy has been consulted for Vancomycin, aztreonam, and metronidazole dosing.   Plan: 07/02 @ 0130 Patient placed on cipro 750 mg PO bid for diabetic foot infx. Patient is POD1 w/ wound cultures growing S. Aureus. Spoke to MD about decreasing dose to 500 po bid since 750 mg bid is reserved for evidence of P. Aeruginosa (which patient has no evidence of). Will decrease dose to cipro 500 mg PO BID. QTc WNL.  Height: 5\' 6"  (167.6 cm) Weight: 148 lb 3.2 oz (67.2 kg) IBW/kg (Calculated) : 63.8  Temp (24hrs), Avg:98.3 F (36.8 C), Min:97.9 F (36.6 C), Max:98.6 F (37 C)  Recent Labs  Lab 12/27/17 0425 12/29/17 2039 12/31/17 0540 12/31/17 1925 01/02/18 0539 01/02/18 0743  WBC  --  15.8* 10.8*  --  12.5*  --   CREATININE 1.08 1.34* 1.07  --   --  1.24  LATICACIDVEN  --  1.8  --   --   --   --   VANCOTROUGH  --   --   --  19  --  22*    Estimated Creatinine Clearance: 78.6 mL/min (by C-G formula based on SCr of 1.24 mg/dL).    Allergies  Allergen Reactions  . Banana Hives, Nausea And Vomiting and Rash  . Keflex [Cephalexin] Anaphylaxis  . Onion Hives, Nausea And Vomiting and Rash  . Sulfa Antibiotics Anaphylaxis  . Grapeseed Extract [Nutritional Supplements] Itching  . Shellfish Allergy Hives    "ALL SEAFOOD"    Antimicrobials this admission: 6/27 Aztreonam >>  6/27 Vancomycin >>  6/27 Metronidazole>>   Dose adjustments this admission:  Microbiology results: 6/27 BCx: NGTD   Thank you for allowing pharmacy to be a part of this patient's care.  Tobie Lords, PharmD, BCPS Clinical Pharmacist 01/03/2018 1:53 AM

## 2018-01-03 NOTE — Progress Notes (Signed)
[facility]                                                      Patient Demographics  Shawn [name], is a 32 y.o. male   MRN: [id number]   DOB - [birth year]  Admit Date - 12/29/2017    Outpatient Primary MD for the patient is Olmedo, Guy Begin, MD  Consult requested in the Hospital by Henreitta Leber, MD, On 01/03/2018   Past Medical History:  Diagnosis Date  . Diabetes mellitus without complication ([place])   . GERD (gastroesophageal reflux disease)   . IBS (irritable bowel syndrome)       Past Surgical History:  Procedure Laterality Date  . ABDOMINAL AORTOGRAM W/LOWER EXTREMITY Right 12/23/2017   Procedure: ABDOMINAL AORTOGRAM W/LOWER EXTREMITY;  Surgeon: Katha Cabal, MD;  Location: [place] [facility];  Service: Cardiovascular;  Laterality: Right;  . AMPUTATION Right 12/24/2017   Procedure: AMPUTATION RAY;  Surgeon: Sharlotte Alamo, DPM;  Location: [facility];  Service: Podiatry;  Laterality: Right;  . APPLICATION OF WOUND VAC Right 12/24/2017   Procedure: APPLICATION OF WOUND VAC;  Surgeon: Sharlotte Alamo, DPM;  Location: [facility];  Service: Podiatry;  Laterality: Right;  . APPLICATION OF WOUND VAC Right 01/01/2018   Procedure: APPLICATION OF WOUND VAC;  Surgeon: Albertine Patricia, DPM;  Location: [facility];  Service: Podiatry;  Laterality: Right;  . IRRIGATION AND DEBRIDEMENT FOOT Right 12/20/2017   Procedure: IRRIGATION AND DEBRIDEMENT FOOT;  Surgeon: Sharlotte Alamo, DPM;  Location: [facility];  Service: Podiatry;  Laterality: Right;  . IRRIGATION AND DEBRIDEMENT FOOT Right 12/24/2017   Procedure: IRRIGATION AND DEBRIDEMENT FOOT;  Surgeon: Sharlotte Alamo, DPM;  Location: [facility];  Service: Podiatry;  Laterality: Right;  . IRRIGATION AND DEBRIDEMENT FOOT Right 01/01/2018   Procedure: IRRIGATION AND DEBRIDEMENT FOOT-SKIN,SOFT TISSUE AND BONE;  Surgeon: Albertine Patricia, DPM;  Location: [facility];  Service: Podiatry;  Laterality: Right;    in for   Chief Complaint  Patient presents with  . Post-op Problem     HPI  Shawn [name]  is a 32 y.o. male, 2 days SP debridement of necrotic skin, soft tissue , and bone.  Wound vac applied Sunday.    Review of Systems  Alert pleasant and well oriented.  Some mental stressors due to potential loss of foot.  In addition to the HPI above,  No Fever-chills, No Headache, No changes with Vision or hearing, No problems swallowing food or Liquids, No Chest pain, Cough or Shortness of Breath, No Abdominal pain, No Nausea or Vommitting, Bowel movements are regular, No Blood in stool or Urine, No dysuria, No new skin rashes or bruises, No new joints pains-aches,  No new weakness, tingling, numbness in any extremity, No recent weight gain or loss, No polyuria, polydypsia or polyphagia, No significant Mental Stressors.  A full 10 point Review of Systems was done, except as stated above, all other Review of Systems were negative.   Social History Social History   Tobacco Use  . Smoking status: Never Smoker  . Smokeless tobacco: Never Used  Substance Use Topics  . Alcohol use: No    Frequency: Never    Family History Family History  Problem Relation Age of Onset  . Diabetes Mother   . Ovarian cancer Mother  Prior to Admission medications   Medication Sig Start Date End Date Taking? Authorizing Provider  ALPRAZolam (XANAX) 0.25 MG tablet Take 1 tablet (0.25 mg total) by mouth 3 (three) times daily as needed for anxiety. 12/27/17  Yes Wieting, Richard, MD  doxycycline (VIBRA-TABS) 100 MG tablet Take 1 tablet (100 mg total) by mouth 2 (two) times daily with a meal for 14 days. 12/26/17 01/09/18 Yes Wieting, Richard, MD  FLUoxetine (PROZAC) 20 MG capsule Take 1 capsule (20 mg total) by mouth daily. 11/12/17  Yes Gladstone Lighter, MD  insulin glargine (LANTUS) 100 UNIT/ML injection Inject 22  Units into the skin daily.    Yes [provider]  lisinopril (PRINIVIL,ZESTRIL) 10 MG tablet Take 1 tablet (10 mg total) by mouth daily. 11/12/17  Yes Gladstone Lighter, MD  NOVOLIN R RELION 100 UNIT/ML injection Inject 8 Units into the skin 3 (three) times daily with meals. 12/27/17  Yes [provider]  oxyCODONE-acetaminophen (PERCOCET/ROXICET) 5-325 MG tablet Take 1 tablet by mouth every 6 (six) hours as needed for moderate pain. 12/26/17  Yes Loletha Grayer, MD  promethazine (PHENERGAN) 12.5 MG tablet Take 1 tablet (12.5 mg total) by mouth every 6 (six) hours as needed for nausea or vomiting. 12/26/17  Yes Wieting, Richard, MD  feeding supplement, GLUCERNA SHAKE, (GLUCERNA SHAKE) LIQD Take 237 mLs by mouth 3 (three) times daily between meals. 12/27/17   Loletha Grayer, MD  ferrous sulfate 325 (65 FE) MG tablet Take 1 tablet (325 mg total) by mouth 2 (two) times daily with a meal. Patient not taking: Reported on 12/30/2017 12/26/17   Loletha Grayer, MD  insulin aspart (NOVOLOG) 100 UNIT/ML injection Inject 8 Units into the skin 3 (three) times daily with meals. Patient not taking: Reported on 12/29/2017 12/26/17   Loletha Grayer, MD  Insulin Syringes, Disposable, U-100 0.3 ML MISC 1 Syringe by Does not apply route 4 (four) times daily -  with meals and at bedtime. 12/27/17   Loletha Grayer, MD  metoCLOPramide (REGLAN) 10 MG tablet Take 1 tablet (10 mg total) by mouth 3 (three) times daily before meals. Patient not taking: Reported on 12/30/2017 12/26/17   Loletha Grayer, MD  polyethylene glycol [facility]) packet Take 17 g by mouth daily as needed for moderate constipation. 12/26/17   Loletha Grayer, MD    Anti-infectives (From admission, onward)   Start     Dose/Rate Route Frequency Ordered Stop   01/03/18 1000  vancomycin (VANCOCIN) IVPB 1000 mg/200 mL premix  Status:  Discontinued     1,000 mg 200 mL/hr over 60 Minutes Intravenous Every 24 hours 01/02/18 1002 01/02/18  1324   01/03/18 0800  ciprofloxacin (CIPRO) tablet 500 mg     500 mg Oral 2 times daily 01/03/18 0152     01/02/18 2000  ciprofloxacin (CIPRO) tablet 750 mg  Status:  Discontinued     750 mg Oral 2 times daily 01/02/18 1526 01/03/18 0152   01/02/18 1530  doxycycline (VIBRA-TABS) tablet 100 mg     100 mg Oral Every 12 hours 01/02/18 1526     12/30/17 2000  vancomycin (VANCOCIN) IVPB 750 mg/150 ml premix  Status:  Discontinued     750 mg 150 mL/hr over 60 Minutes Intravenous Every 12 hours 12/30/17 1140 01/02/18 1002   12/30/17 0800  vancomycin (VANCOCIN) IVPB 750 mg/150 ml premix  Status:  Discontinued     750 mg 150 mL/hr over 60 Minutes Intravenous Every 18 hours 12/29/17 2201 12/30/17 1140   12/30/17 0600  aztreonam (AZACTAM) 2 g in sodium chloride 0.9 % 100 mL IVPB  Status:  Discontinued     2 g 200 mL/hr over 30 Minutes Intravenous Every 8 hours 12/29/17 2201 01/02/18 1526   12/30/17 0600  metroNIDAZOLE (FLAGYL) IVPB 500 mg  Status:  Discontinued     500 mg 100 mL/hr over 60 Minutes Intravenous Every 8 hours 12/29/17 2201 01/02/18 1526   12/29/17 2045  aztreonam (AZACTAM) 2 g in sodium chloride 0.9 % 100 mL IVPB     2 g 200 mL/hr over 30 Minutes Intravenous  Once 12/29/17 2030 12/29/17 2145   12/29/17 2045  metroNIDAZOLE (FLAGYL) IVPB 500 mg     500 mg 100 mL/hr over 60 Minutes Intravenous  Once 12/29/17 2030 12/29/17 2225   12/29/17 2045  vancomycin (VANCOCIN) IVPB 1000 mg/200 mL premix     1,000 mg 200 mL/hr over 60 Minutes Intravenous  Once 12/29/17 2030 12/29/17 2304      Scheduled Meds: . ciprofloxacin  500 mg Oral BID  . docusate sodium  100 mg Oral BID  . doxycycline  100 mg Oral Q12H  . feeding supplement (GLUCERNA SHAKE)  237 mL Oral TID BM  . ferrous sulfate  325 mg Oral BID WC  . FLUoxetine  20 mg Oral Daily  . heparin  5,000 Units Subcutaneous Q8H  . insulin aspart  0-15 Units Subcutaneous TID WC  . insulin glargine  25 Units Subcutaneous QHS  . lisinopril  10  mg Oral Daily  . metoCLOPramide  10 mg Oral TID AC  . ondansetron (ZOFRAN) IV  4 mg Intravenous Once  . oxyCODONE  10 mg Oral Q12H   Continuous Infusions: PRN Meds:.acetaminophen **OR** acetaminophen, ALPRAZolam, ondansetron **OR** ondansetron (ZOFRAN) IV, oxyCODONE-acetaminophen, polyethylene glycol  Allergies  Allergen Reactions  . Banana Hives, Nausea And Vomiting and Rash  . Keflex [Cephalexin] Anaphylaxis  . Onion Hives, Nausea And Vomiting and Rash  . Sulfa Antibiotics Anaphylaxis  . Grapeseed Extract [Nutritional Supplements] Itching  . Shellfish Allergy Hives    "ALL SEAFOOD"    Physical Exam  Vitals  Blood pressure (!) 133/93, pulse 88, temperature 97.9 F (36.6 C), temperature source Oral, resp. rate 16, height 5\' 6"  (1.676 m), weight 67.3 kg (148 lb 6.4 oz), SpO2 98 %.  Lower Extremity exam:foot does not look infected.  Some better granulation tissue as compared top pre op.  A few small areas appear to be struggling but it is early.  No cellulitis or drainage.  Data Review  CBC Recent Labs  Lab 12/29/17 2039 12/31/17 0540 01/01/18 2028 01/02/18 0539 01/03/18 0515  WBC 15.8* 10.8*  --  12.5* 8.1  HGB 7.7* 7.6* 7.7* 7.0* 9.0*  HCT 23.4* 22.8*  --  21.4* 26.6*  PLT 609* 577*  --  549* 547*  MCV 86.7 85.7  --  85.5 84.4  MCH 28.3 28.7  --  28.0 28.5  MCHC 32.7 33.5  --  32.7 33.7  RDW 14.8* 14.5  --  14.6* 15.1*  LYMPHSABS 2.3  --   --   --   --   MONOABS 1.3*  --   --   --   --   EOSABS 0.2  --   --   --   --   BASOSABS 0.1  --   --   --   --    ------------------------------------------------------------------------------------------------------------------  Chemistries  Recent Labs  Lab 12/29/17 2039 12/31/17 0540 01/02/18 0743  NA 136 138  --  K 3.4* 4.3  --   CL 103 110  --   CO2 23 22  --   GLUCOSE 425* 93  --   BUN 26* 23*  --   CREATININE 1.34* 1.07 1.24  CALCIUM 8.1* 8.1*  --   AST 23 81*  --   ALT 20 71*  --   ALKPHOS 90 167*  --    BILITOT 0.3 0.4  --    ------------------- Assessment & Plan:  I explained to patient we need to continue vac.  He has a portable unit.  Also needs to remain completely Non wt. Bearing on right foot at all times.  Abx according to hospitalist.  He has appt to see Dr. Cleda Mccreedy next week.  Wound vac at 180mmHg continous.  Active Problems:   Cellulitis   Family Communication: Plan discussed with patient   Albertine Patricia M.D on 01/03/2018 at 7:08 AM  Thank you for the consult, we will follow the patient with you in the Hospital.

## 2018-01-03 NOTE — Progress Notes (Signed)
Medication times modified - doxycycline changed from Q12H to BID with meals to avoid ADE/GI upset. Iron changed from BID with meals to BID 1200 (lunch) and 2200 to avoid administering around doxycycline. Give doxycycline 2 hours before or 4 hours after divalent cations.  Amia Rynders A. [place], [place].D., BCPS Clinical Pharmacist 01/03/18 8:34

## 2018-01-03 NOTE — Care Management Note (Signed)
Case Management Note  Patient Details  Name: Shawn [name] MRN: [id number] Date of Birth: 12-30-1985  Subjective/Objective:                   RNCM spoke with patient regarding resumption of care. He states he may not have a job when he gets home as he has missed a lot of work.  He does not have family that can financially support him.  His grandmother is on a fixed income and his father is also out of work.  Patient has tried to get help with medications at [facility] however his work insurance is still open.  He will call this RNCM if he has any problems with medications.  He would like to return home followed by [facility]- he states they have only been out once.  He has his home wound vac at bedside and nurse is attempted to place VAC. He has a walker available but it is at home.  Action/Plan:   Corene Cornea with [facility] updated on patient discharge to home.   Expected Discharge Date:  01/03/18               Expected Discharge Plan:     In-House Referral:     Discharge planning Services  CM Consult  Post Acute Care Choice:  Home Health, Resumption of Svcs/PTA Provider Choice offered to:     DME Arranged:    DME Agency:     HH Arranged:  RN, PT, Social Work [employer] Agency:  [place]  Status of Service:  Completed, signed off  If discussed at [employer], dates discussed:    Additional Comments:  Marshell Garfinkel, RN 01/03/2018, 10:22 AM

## 2018-01-04 NOTE — Discharge Summary (Signed)
[facility] NAME: Shawn [name]    MR#:  [id number]  DATE OF BIRTH:  1986/02/11  DATE OF ADMISSION:  12/29/2017 ADMITTING PHYSICIAN: Harrie Foreman, MD  DATE OF DISCHARGE: 01/03/2018 11:51 AM  PRIMARY CARE PHYSICIAN: Valera Castle, MD    ADMISSION DIAGNOSIS:  Hyperglycemia [R73.9] Cellulitis of right lower extremity [L03.115] Sepsis, due to unspecified organism ([place]) [A41.9]  DISCHARGE DIAGNOSIS:  Active Problems:   Cellulitis   SECONDARY DIAGNOSIS:   Past Medical History:  Diagnosis Date  . Diabetes mellitus without complication ([place])   . GERD (gastroesophageal reflux disease)   . IBS (irritable bowel syndrome)     HOSPITAL COURSE:   32 year old male with past medical history of diabetes, chronic anemia, hypertension who was recently admitted to the hospital for sepsis secondary to right diabetic foot ulcer and discharge returns back secondary to worsening pain in the right foot.  1.  Right diabetic foot ulcer/cellulitis-  patient was admitted to the hospital and started on broad-spectrum IV antibiotics with vancomycin, aztreonam, Flagyl. -A podiatry consult was obtained.  Patient underwent debridement and removal of necrotic tissue on January 01, 2018.  As per podiatry he did not notice any evidence of overt infection.  Patient's white cell count improved with antibiotics, he was afebrile and hemodynamically stable.   The antibiotics were narrowed down from vancomycin/aztreonam/Flagyl to oral doxycycline and ciprofloxacin.  Patient is being discharged on a 2-week course of those antibiotics with follow-up with podiatry as an outpatient.  Patient was told to be nonweightbearing on the right foot until he follows up with podiatry.  2.  Hypoglycemia/hypothermia-  this was secondary to underlying infection.  Patient was placed on a dextrose drip and also given a bear hugger for his hypothermia.  This quickly resolved  within 24 hours and patient has been stable ever since.  3.  Essential hypertension- he will continue lisinopril.  4.  Depression - he will cont. Prozac.   5. Anemia - this was anemia of chronic disease combined with acute blood loss anemia from recent surgery.  -Patient's hemoglobin dropped to as low as 7 and he was transfused 1 unit of packed red blood cells and hemoglobin improved posttransfusion.  He will continue follow-up as an outpatient. -he will continue iron supplements.  6.  Anxiety-patient will continue his Xanax as needed.   DISCHARGE CONDITIONS:   Stable  CONSULTS OBTAINED:  Treatment Team:  Albertine Patricia, DPM  DRUG ALLERGIES:   Allergies  Allergen Reactions  . Banana Hives, Nausea And Vomiting and Rash  . Keflex [Cephalexin] Anaphylaxis  . Onion Hives, Nausea And Vomiting and Rash  . Sulfa Antibiotics Anaphylaxis  . Grapeseed Extract [Nutritional Supplements] Itching  . Shellfish Allergy Hives    "ALL SEAFOOD"    DISCHARGE MEDICATIONS:   Allergies as of 01/03/2018      Reactions   Banana Hives, Nausea And Vomiting, Rash   Keflex [cephalexin] Anaphylaxis   Onion Hives, Nausea And Vomiting, Rash   Sulfa Antibiotics Anaphylaxis   Grapeseed Extract [nutritional Supplements] Itching   Shellfish Allergy Hives   "ALL SEAFOOD"      Medication List    STOP taking these medications   insulin aspart 100 UNIT/ML injection Commonly known as:  novoLOG   promethazine 12.5 MG tablet Commonly known as:  PHENERGAN     TAKE these medications   ALPRAZolam 0.25 MG tablet Commonly known as:  XANAX Take 1 tablet (0.25 mg total)  by mouth 3 (three) times daily as needed for anxiety.   ciprofloxacin 500 MG tablet Commonly known as:  CIPRO Take 1 tablet (500 mg total) by mouth 2 (two) times daily for 14 days.   doxycycline 100 MG tablet Commonly known as:  VIBRA-TABS Take 1 tablet (100 mg total) by mouth 2 (two) times daily with a meal for 14 days.    feeding supplement (GLUCERNA SHAKE) Liqd Take 237 mLs by mouth 3 (three) times daily between meals.   ferrous sulfate 325 (65 FE) MG tablet Take 1 tablet (325 mg total) by mouth 2 (two) times daily with a meal.   FLUoxetine 20 MG capsule Commonly known as:  PROZAC Take 1 capsule (20 mg total) by mouth daily.   insulin glargine 100 UNIT/ML injection Commonly known as:  LANTUS Inject 22 Units into the skin daily.   Insulin Syringes (Disposable) U-100 0.3 ML Misc 1 Syringe by Does not apply route 4 (four) times daily -  with meals and at bedtime.   lisinopril 10 MG tablet Commonly known as:  PRINIVIL,ZESTRIL Take 1 tablet (10 mg total) by mouth daily.   metoCLOPramide 10 MG tablet Commonly known as:  REGLAN Take 1 tablet (10 mg total) by mouth 3 (three) times daily before meals.   NOVOLIN R RELION 100 units/mL injection Generic drug:  insulin regular Inject 8 Units into the skin 3 (three) times daily with meals.   oxyCODONE-acetaminophen 5-325 MG tablet Commonly known as:  PERCOCET/ROXICET Take 1 tablet by mouth every 6 (six) hours as needed for moderate pain.   polyethylene glycol packet Commonly known as:  MIRALAX Take 17 g by mouth daily as needed for moderate constipation.         DISCHARGE INSTRUCTIONS:   DIET:  Cardiac diet and Diabetic diet  DISCHARGE CONDITION:  Stable  ACTIVITY:  Activity as tolerated  OXYGEN:  Home Oxygen: No.   Oxygen Delivery: room air  DISCHARGE LOCATION:  home   If you experience worsening of your admission symptoms, develop shortness of breath, life threatening emergency, suicidal or homicidal thoughts you must seek medical attention immediately by calling 911 or calling your MD immediately  if symptoms less severe.  You Must read complete instructions/literature along with all the possible adverse reactions/side effects for all the Medicines you take and that have been prescribed to you. Take any new Medicines after you  have completely understood and accpet all the possible adverse reactions/side effects.   Please note  You were cared for by a hospitalist during your hospital stay. If you have any questions about your discharge medications or the care you received while you were in the hospital after you are discharged, you can call the unit and asked to speak with the hospitalist on call if the hospitalist that took care of you is not available. Once you are discharged, your primary care physician will handle any further medical issues. Please note that NO REFILLS for any discharge medications will be authorized once you are discharged, as it is imperative that you return to your primary care physician (or establish a relationship with a primary care physician if you do not have one) for your aftercare needs so that they can reassess your need for medications and monitor your lab values.     Today   Patient still complaining of some pain in the right foot.  Afebrile, hemodynamically stable.  Seen by podiatry this morning and dressing has been changed.  Will discharge on oral antibiotics  and follow-up with podiatry as an outpatient.  VITAL SIGNS:  Blood pressure (!) 148/94, pulse 85, temperature 98.1 F (36.7 C), temperature source Oral, resp. rate 18, height 5\' 6"  (1.676 m), weight 67.3 kg (148 lb 6.4 oz), SpO2 99 %.  I/O:  No intake or output data in the 24 hours ending 01/04/18 1513  PHYSICAL EXAMINATION:   GENERAL:  32 y.o.-year-old patient lying in bed in NAD.  EYES: Pupils equal, round, reactive to light and accommodation. No scleral icterus. Extraocular muscles intact.  HEENT: Head atraumatic, normocephalic. Oropharynx and nasopharynx clear.  NECK:  Supple, no jugular venous distention. No thyroid enlargement, no tenderness.  LUNGS: Normal breath sounds bilaterally, no wheezing, rales, rhonchi. No use of accessory muscles of respiration.  CARDIOVASCULAR: S1, S2 normal. No murmurs, rubs, or gallops.   ABDOMEN: Soft, nontender, nondistended. Bowel sounds present. No organomegaly or mass.  EXTREMITIES: No cyanosis, clubbing or edema b/l. Right foot dressing with orthotic boot on. NEUROLOGIC: Cranial nerves II through XII are intact. No focal Motor or sensory deficits b/l.   PSYCHIATRIC: The patient is alert and oriented x 3.  SKIN: No obvious rash, lesion, or ulcer.     DATA REVIEW:   CBC Recent Labs  Lab 01/03/18 0515  WBC 8.1  HGB 9.0*  HCT 26.6*  PLT 547*    Chemistries  Recent Labs  Lab 12/31/17 0540 01/02/18 0743  NA 138  --   K 4.3  --   CL 110  --   CO2 22  --   GLUCOSE 93  --   BUN 23*  --   CREATININE 1.07 1.24  CALCIUM 8.1*  --   AST 81*  --   ALT 71*  --   ALKPHOS 167*  --   BILITOT 0.4  --     Cardiac Enzymes No results for input(s): TROPONINI in the last 168 hours.  Microbiology Results  Results for orders placed or performed during the hospital encounter of 12/29/17  Blood Culture (routine x 2)     Status: None   Collection Time: 12/29/17  8:12 PM  Result Value Ref Range Status   Specimen Description BLOOD BLOOD LEFT FOREARM  Final   Special Requests   Final    Blood Culture results may not be optimal due to an excessive volume of blood received in culture bottles   Culture   Final    NO GROWTH 5 DAYS Performed at [facility], [address], [place], [place] [address]    Report Status 01/03/2018 FINAL  Final  Blood Culture (routine x 2)     Status: None   Collection Time: 12/29/17  8:39 PM  Result Value Ref Range Status   Specimen Description BLOOD RIGHT ARM  Final   Special Requests   Final    BOTTLES DRAWN AEROBIC AND ANAEROBIC Blood Culture results may not be optimal due to an excessive volume of blood received in culture bottles   Culture   Final    NO GROWTH 5 DAYS Performed at [facility], [address], [place],  [address]    Report Status 01/03/2018 FINAL  Final    RADIOLOGY:  No results  found.    Management plans discussed with the patient, family and they are in agreement.  CODE STATUS:  Code Status History    Date Active Date Inactive Code Status Order ID Comments User Context   12/30/2017 0019 01/03/2018 1457 Full Code [id number]  Harrie Foreman, MD  Inpatient   TOTAL TIME TAKING CARE OF THIS PATIENT: 40 minutes.    Henreitta Leber M.D on 01/04/2018 at 3:13 PM  Between 7am to 6pm - Pager - [phone]  After 6pm go to [email or web address] - [employer] [facility]  Office  [phone]  CC: Primary care physician; Valera Castle, MD

## 2018-01-10 ENCOUNTER — Encounter: Payer: Self-pay | Admitting: Emergency Medicine

## 2018-01-10 ENCOUNTER — Other Ambulatory Visit: Payer: Self-pay

## 2018-01-10 ENCOUNTER — Emergency Department: Payer: BLUE CROSS/BLUE SHIELD

## 2018-01-10 ENCOUNTER — Observation Stay
Admission: EM | Admit: 2018-01-10 | Discharge: 2018-01-11 | Disposition: A | Discharge status: Home / Self Care | Payer: BLUE CROSS/BLUE SHIELD | Attending: Internal Medicine | Admitting: Internal Medicine

## 2018-01-10 DIAGNOSIS — A419 Sepsis, unspecified organism: Secondary | ICD-10-CM

## 2018-01-10 DIAGNOSIS — R197 Diarrhea, unspecified: Secondary | ICD-10-CM

## 2018-01-10 DIAGNOSIS — Z79899 Other long term (current) drug therapy: Secondary | ICD-10-CM | POA: Diagnosis not present

## 2018-01-10 DIAGNOSIS — E7800 Pure hypercholesterolemia, unspecified: Secondary | ICD-10-CM | POA: Diagnosis not present

## 2018-01-10 DIAGNOSIS — K580 Irritable bowel syndrome with diarrhea: Secondary | ICD-10-CM | POA: Diagnosis not present

## 2018-01-10 DIAGNOSIS — Z888 Allergy status to other drugs, medicaments and biological substances status: Secondary | ICD-10-CM | POA: Diagnosis not present

## 2018-01-10 DIAGNOSIS — E1043 Type 1 diabetes mellitus with diabetic autonomic (poly)neuropathy: Secondary | ICD-10-CM | POA: Diagnosis not present

## 2018-01-10 DIAGNOSIS — F329 Major depressive disorder, single episode, unspecified: Secondary | ICD-10-CM | POA: Insufficient documentation

## 2018-01-10 DIAGNOSIS — Z91013 Allergy to seafood: Secondary | ICD-10-CM | POA: Insufficient documentation

## 2018-01-10 DIAGNOSIS — T8743 Infection of amputation stump, right lower extremity: Secondary | ICD-10-CM | POA: Insufficient documentation

## 2018-01-10 DIAGNOSIS — Z882 Allergy status to sulfonamides status: Secondary | ICD-10-CM | POA: Diagnosis not present

## 2018-01-10 DIAGNOSIS — R112 Nausea with vomiting, unspecified: Secondary | ICD-10-CM

## 2018-01-10 DIAGNOSIS — K3184 Gastroparesis: Secondary | ICD-10-CM | POA: Diagnosis not present

## 2018-01-10 DIAGNOSIS — T8740 Infection of amputation stump, unspecified extremity: Secondary | ICD-10-CM

## 2018-01-10 DIAGNOSIS — K589 Irritable bowel syndrome without diarrhea: Secondary | ICD-10-CM | POA: Diagnosis not present

## 2018-01-10 DIAGNOSIS — R739 Hyperglycemia, unspecified: Secondary | ICD-10-CM

## 2018-01-10 DIAGNOSIS — K219 Gastro-esophageal reflux disease without esophagitis: Secondary | ICD-10-CM | POA: Insufficient documentation

## 2018-01-10 DIAGNOSIS — M869 Osteomyelitis, unspecified: Secondary | ICD-10-CM | POA: Diagnosis present

## 2018-01-10 DIAGNOSIS — Y835 Amputation of limb(s) as the cause of abnormal reaction of the patient, or of later complication, without mention of misadventure at the time of the procedure: Secondary | ICD-10-CM | POA: Diagnosis not present

## 2018-01-10 DIAGNOSIS — F419 Anxiety disorder, unspecified: Secondary | ICD-10-CM | POA: Diagnosis not present

## 2018-01-10 DIAGNOSIS — I10 Essential (primary) hypertension: Secondary | ICD-10-CM | POA: Insufficient documentation

## 2018-01-10 DIAGNOSIS — E1051 Type 1 diabetes mellitus with diabetic peripheral angiopathy without gangrene: Secondary | ICD-10-CM | POA: Insufficient documentation

## 2018-01-10 DIAGNOSIS — Z794 Long term (current) use of insulin: Secondary | ICD-10-CM | POA: Diagnosis not present

## 2018-01-10 LAB — GLUCOSE, CAPILLARY
GLUCOSE-CAPILLARY: 226 mg/dL — AB (ref 70–99)
GLUCOSE-CAPILLARY: 103 mg/dL — AB (ref 70–99)
Glucose-Capillary: 56 mg/dL — ABNORMAL LOW (ref 70–99)

## 2018-01-10 LAB — CBC
HCT: 32.2 % — ABNORMAL LOW (ref 40.0–52.0)
Hemoglobin: 10.7 g/dL — ABNORMAL LOW (ref 13.0–18.0)
MCH: 28.4 pg (ref 26.0–34.0)
MCHC: 33.2 g/dL (ref 32.0–36.0)
MCV: 85.6 fL (ref 80.0–100.0)
Platelets: 480 10*3/uL — ABNORMAL HIGH (ref 150–440)
RBC: 3.76 MIL/uL — ABNORMAL LOW (ref 4.40–5.90)
RDW: 15.2 % — ABNORMAL HIGH (ref 11.5–14.5)
WBC: 9.7 10*3/uL (ref 3.8–10.6)

## 2018-01-10 LAB — COMPREHENSIVE METABOLIC PANEL
ALT: 33 U/L (ref 0–44)
AST: 28 U/L (ref 15–41)
Albumin: 3 g/dL — ABNORMAL LOW (ref 3.5–5.0)
Alkaline Phosphatase: 116 U/L (ref 38–126)
Anion gap: 7 (ref 5–15)
BUN: 25 mg/dL — ABNORMAL HIGH (ref 6–20)
CO2: 30 mmol/L (ref 22–32)
Calcium: 9 mg/dL (ref 8.9–10.3)
Chloride: 102 mmol/L (ref 98–111)
Creatinine, Ser: 1.2 mg/dL (ref 0.61–1.24)
GFR calc Af Amer: >60 mL/min (ref >60)
GFR calc non Af Amer: >60 mL/min (ref >60)
GLUCOSE: 224 mg/dL — AB (ref 70–99)
Potassium: 4.3 mmol/L (ref 3.5–5.1)
SODIUM: 139 mmol/L (ref 135–145)
Total Bilirubin: 0.3 mg/dL (ref 0.3–1.2)
Total Protein: 7.1 g/dL (ref 6.5–8.1)

## 2018-01-10 LAB — SEDIMENTATION RATE: SED RATE: 51 mm/h — AB (ref 0–15)

## 2018-01-10 LAB — LACTIC ACID, PLASMA: Lactic Acid, Venous: 0.9 mmol/L (ref 0.5–1.9)

## 2018-01-10 LAB — LIPASE, BLOOD: Lipase: 50 U/L (ref 11–51)

## 2018-01-10 MED ORDER — ONDANSETRON HCL 4 MG PO TABS: 4.0000 mg | ORAL_TABLET | Freq: Four times a day (QID) | ORAL | Status: DC | PRN

## 2018-01-10 MED ORDER — IOHEXOL 300 MG/ML  SOLN
100.0000 mL | Freq: Once | INTRAMUSCULAR | Status: AC | PRN
  Administered 2018-01-10: 100 mL via INTRAVENOUS

## 2018-01-10 MED ORDER — CLINDAMYCIN PHOSPHATE 900 MG/50ML IV SOLN
900.0000 mg | Freq: Once | INTRAVENOUS | Status: AC
  Administered 2018-01-10: 900 mg via INTRAVENOUS
  Filled 2018-01-10: qty 50

## 2018-01-10 MED ORDER — ADULT MULTIVITAMIN W/MINERALS CH
1.0000 | ORAL_TABLET | Freq: Every day | ORAL | Status: DC
  Administered 2018-01-11: 1 via ORAL
  Filled 2018-01-10: qty 1

## 2018-01-10 MED ORDER — BISACODYL 5 MG PO TBEC: 5.0000 mg | DELAYED_RELEASE_TABLET | Freq: Every day | ORAL | Status: DC | PRN

## 2018-01-10 MED ORDER — ACETAMINOPHEN 325 MG PO TABS: 650.0000 mg | ORAL_TABLET | Freq: Four times a day (QID) | ORAL | Status: DC | PRN

## 2018-01-10 MED ORDER — ONDANSETRON HCL 4 MG/2ML IJ SOLN: 4.0000 mg | Freq: Four times a day (QID) | INTRAMUSCULAR | Status: DC | PRN

## 2018-01-10 MED ORDER — POLYETHYLENE GLYCOL 3350 17 G PO PACK: 17.0000 g | PACK | Freq: Every day | ORAL | Status: DC | PRN

## 2018-01-10 MED ORDER — DEXTROSE 50 % IV SOLN
INTRAVENOUS | Status: AC
  Administered 2018-01-10: 25 mL
  Filled 2018-01-10: qty 50

## 2018-01-10 MED ORDER — ONDANSETRON HCL 4 MG/2ML IJ SOLN
4.0000 mg | Freq: Once | INTRAMUSCULAR | Status: AC | PRN
  Administered 2018-01-10: 4 mg via INTRAVENOUS
  Filled 2018-01-10: qty 2

## 2018-01-10 MED ORDER — HYDROMORPHONE HCL 1 MG/ML IJ SOLN
0.5000 mg | Freq: Three times a day (TID) | INTRAMUSCULAR | Status: DC | PRN
  Administered 2018-01-10: 0.5 mg via INTRAVENOUS
  Filled 2018-01-10: qty 1

## 2018-01-10 MED ORDER — DEXTROSE 50 % IV SOLN: 25.0000 mL | Freq: Once | INTRAVENOUS | Status: DC

## 2018-01-10 MED ORDER — FERROUS SULFATE 325 (65 FE) MG PO TABS
325.0000 mg | ORAL_TABLET | Freq: Two times a day (BID) | ORAL | Status: DC
  Administered 2018-01-11: 325 mg via ORAL
  Filled 2018-01-10: qty 1

## 2018-01-10 MED ORDER — HYDROMORPHONE HCL 1 MG/ML IJ SOLN
1.0000 mg | Freq: Once | INTRAMUSCULAR | Status: AC
  Administered 2018-01-10: 1 mg via INTRAVENOUS
  Filled 2018-01-10: qty 1

## 2018-01-10 MED ORDER — INSULIN REGULAR HUMAN 100 UNIT/ML IJ SOLN: 0.0000 [IU] | Freq: Three times a day (TID) | INTRAMUSCULAR | Status: DC | PRN

## 2018-01-10 MED ORDER — HEPARIN SODIUM (PORCINE) 5000 UNIT/ML IJ SOLN
5000.0000 [IU] | Freq: Three times a day (TID) | INTRAMUSCULAR | Status: DC
  Administered 2018-01-10 – 2018-01-11 (×3): 5000 [IU] via SUBCUTANEOUS
  Filled 2018-01-10 (×3): qty 1

## 2018-01-10 MED ORDER — SODIUM CHLORIDE 0.9 % IV BOLUS
1000.0000 mL | Freq: Once | INTRAVENOUS | Status: AC
  Administered 2018-01-10: 1000 mL via INTRAVENOUS

## 2018-01-10 MED ORDER — INSULIN ASPART 100 UNIT/ML ~~LOC~~ SOLN: 0.0000 [IU] | Freq: Every day | SUBCUTANEOUS | Status: DC

## 2018-01-10 MED ORDER — INSULIN ASPART 100 UNIT/ML ~~LOC~~ SOLN: 0.0000 [IU] | Freq: Three times a day (TID) | SUBCUTANEOUS | Status: DC

## 2018-01-10 MED ORDER — SODIUM CHLORIDE 0.9 % IV SOLN
INTRAVENOUS | Status: DC
  Administered 2018-01-10 – 2018-01-11 (×2): via INTRAVENOUS

## 2018-01-10 MED ORDER — OXYCODONE-ACETAMINOPHEN 5-325 MG PO TABS: 1.0000 | ORAL_TABLET | ORAL | Status: DC | PRN

## 2018-01-10 MED ORDER — GLUCERNA SHAKE PO LIQD: 237.0000 mL | Freq: Three times a day (TID) | ORAL | Status: DC

## 2018-01-10 MED ORDER — HYDROCODONE-ACETAMINOPHEN 5-325 MG PO TABS
1.0000 | ORAL_TABLET | ORAL | Status: DC | PRN
  Administered 2018-01-11 (×3): 2 via ORAL
  Filled 2018-01-10 (×4): qty 2

## 2018-01-10 MED ORDER — FLUOXETINE HCL 20 MG PO CAPS
20.0000 mg | ORAL_CAPSULE | Freq: Every day | ORAL | Status: DC
  Administered 2018-01-11: 20 mg via ORAL
  Filled 2018-01-10: qty 1

## 2018-01-10 MED ORDER — DOCUSATE SODIUM 100 MG PO CAPS
100.0000 mg | ORAL_CAPSULE | Freq: Two times a day (BID) | ORAL | Status: DC
  Filled 2018-01-10 (×2): qty 1

## 2018-01-10 MED ORDER — ALPRAZOLAM 0.25 MG PO TABS
0.2500 mg | ORAL_TABLET | Freq: Three times a day (TID) | ORAL | Status: DC | PRN
  Administered 2018-01-11: 0.25 mg via ORAL
  Filled 2018-01-10: qty 1

## 2018-01-10 MED ORDER — ONDANSETRON HCL 4 MG/2ML IJ SOLN
4.0000 mg | Freq: Once | INTRAMUSCULAR | Status: AC
  Administered 2018-01-10: 4 mg via INTRAVENOUS
  Filled 2018-01-10: qty 2

## 2018-01-10 MED ORDER — INSULIN GLARGINE 100 UNIT/ML ~~LOC~~ SOLN
32.0000 [IU] | Freq: Two times a day (BID) | SUBCUTANEOUS | Status: DC
  Administered 2018-01-11: 32 [IU] via SUBCUTANEOUS
  Filled 2018-01-10 (×3): qty 0.32

## 2018-01-10 MED ORDER — PROCHLORPERAZINE EDISYLATE 10 MG/2ML IJ SOLN
10.0000 mg | Freq: Once | INTRAMUSCULAR | Status: AC
  Administered 2018-01-10: 10 mg via INTRAVENOUS
  Filled 2018-01-10: qty 2

## 2018-01-10 MED ORDER — LISINOPRIL 10 MG PO TABS
10.0000 mg | ORAL_TABLET | Freq: Every day | ORAL | Status: DC
  Administered 2018-01-11: 10 mg via ORAL
  Filled 2018-01-10: qty 1

## 2018-01-10 MED ORDER — ACETAMINOPHEN 650 MG RE SUPP: 650.0000 mg | Freq: Four times a day (QID) | RECTAL | Status: DC | PRN

## 2018-01-10 NOTE — ED Triage Notes (Signed)
Pt to ED via POV with c/o abd pain with n/v/d. PT hx of DM, states glucose was 341 at hoem. PT has wound vac noted to RT foot where he had recent toe amputation xfwe wks ago.

## 2018-01-10 NOTE — Progress Notes (Signed)
BS 56. Pt Npo. D50 administered as per protocol. Dr Duane Boston made aware. Ok to give pt food now and make him NPO after he eats. Order for dilauid 0.5 q 8 hours obtained.

## 2018-01-10 NOTE — ED Provider Notes (Signed)
[facility] Emergency Department Provider Note  ____________________________________________  Time seen: Approximately 6:03 PM  I have reviewed the triage vital signs and the nursing notes.   HISTORY  Chief Complaint Emesis    HPI Shawn [name] is a 32 y.o. 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  The patient reports that yesterday he began to have multiple episodes of nonbloody diarrhea and this morning he woke up and was not able to keep anything down by mouth with several episodes of vomiting.  He has diffuse abdominal pain although it is worst in the epigastrium and worsened after multiple episodes of vomiting.  He states his pain is just similar to the pain he has with gastroparesis or IBS flares because "it is worse."  He did have an episode of hypoglycemia with a blood sugar in the 50s yesterday and today has been having hyperglycemia.  He was seen recently by his podiatrist at the [facility] clinic who told the patient he felt his foot was healing well and to continue using the wound VAC as well as oral ciprofloxacin.  He reports that today, his pain has increased and he is having increased output from his wound VAC.Marland Kitchen  The patient denies any fevers or chills, lightheadedness or syncope.  He has tried Phenergan for his nausea without any improvement.    Past Medical History:  Diagnosis Date  . Diabetes mellitus without complication ([place])   . GERD (gastroesophageal reflux disease)   . IBS (irritable bowel syndrome)     Patient Active Problem List   Diagnosis Date Noted  . Cellulitis 12/29/2017  . Sepsis ([place]) 12/18/2017  . Moderate recurrent major depression ([place]) 11/10/2017  . Type 2 diabetes mellitus with hyperosmolar nonketotic  hyperglycemia ([place]) 11/09/2017  . History of migraine 07/15/2017  . IBS (irritable bowel syndrome) 07/15/2017  . Protein-calorie malnutrition, severe 07/14/2017  . Abdominal pain 07/13/2017  . GERD (gastroesophageal reflux disease) 12/30/2009  . Diabetes ([place]) 11/25/2009  . MDD (major depressive disorder) 11/25/2009  . Hypercholesterolemia 03/07/2008    Past Surgical History:  Procedure Laterality Date  . ABDOMINAL AORTOGRAM W/LOWER EXTREMITY Right 12/23/2017   Procedure: ABDOMINAL AORTOGRAM W/LOWER EXTREMITY;  Surgeon: Katha Cabal, MD;  Location: [place] [facility];  Service: Cardiovascular;  Laterality: Right;  . AMPUTATION Right 12/24/2017   Procedure: AMPUTATION RAY;  Surgeon: Sharlotte Alamo, DPM;  Location: [facility] ORS;  Service: Podiatry;  Laterality: Right;  . APPLICATION OF WOUND VAC Right 12/24/2017   Procedure: APPLICATION OF WOUND VAC;  Surgeon: Sharlotte Alamo, DPM;  Location: [facility] ORS;  Service: Podiatry;  Laterality: Right;  . APPLICATION OF WOUND VAC Right 01/01/2018   Procedure: APPLICATION OF WOUND VAC;  Surgeon: Albertine Patricia, DPM;  Location: [facility] ORS;  Service: Podiatry;  Laterality: Right;  . IRRIGATION AND DEBRIDEMENT FOOT Right 12/20/2017   Procedure: IRRIGATION AND DEBRIDEMENT FOOT;  Surgeon: Sharlotte Alamo, DPM;  Location: [facility] ORS;  Service: Podiatry;  Laterality: Right;  . IRRIGATION AND DEBRIDEMENT FOOT Right 12/24/2017   Procedure: IRRIGATION AND DEBRIDEMENT FOOT;  Surgeon: Sharlotte Alamo, DPM;  Location: [facility] ORS;  Service: Podiatry;  Laterality: Right;  . IRRIGATION AND DEBRIDEMENT FOOT Right 01/01/2018   Procedure: IRRIGATION AND DEBRIDEMENT FOOT-SKIN,SOFT TISSUE AND BONE;  Surgeon: Albertine Patricia, DPM;  Location: [facility]  ORS;  Service: Podiatry;  Laterality: Right;    Current Outpatient Rx  . Order #: [id number] Class: Print  . Order #: [id number] Class: Print  . Order #: [id number] Class: Print  . Order #: [id number] Class: Print  . Order #: [id number] Class: Print  .  Order #: [id number] Class: Historical Med  . Order #: [id number] Class: Normal  . Order #: [id number] Class: Print  . Order #: [id number] Class: Print  . Order #: [id number] Class: Historical Med  . Order #: [id number] Class: Print  . Order #: [id number] Class: Print    Allergies Banana; Keflex [cephalexin]; Onion; Sulfa antibiotics; Grapeseed extract [nutritional supplements]; and Shellfish allergy  Family History  Problem Relation Age of Onset  . Diabetes Mother   . Ovarian cancer Mother     Social History Social History   Tobacco Use  . Smoking status: Never Smoker  . Smokeless tobacco: Never Used  Substance Use Topics  . Alcohol use: No    Frequency: Never  . Drug use: Yes    Types: Marijuana    Review of Systems Constitutional: No fever/chills.  No lightheadedness or syncope.  Positive general malaise. Eyes: No visual changes. ENT: No sore throat. No congestion or rhinorrhea. Cardiovascular: Denies chest pain. Denies palpitations. Respiratory: Denies shortness of breath.  No cough. Gastrointestinal: Positive diffuse abdominal pain but worse in the epigastrium.  +nausea, +vomiting.  +diarrhea.  No constipation. Genitourinary: Negative for dysuria. Musculoskeletal: Negative for back pain.  Positive for worsening pain in the right foot at the site of fourth and fifth digit toe amputations.  Positive for increased output in the wound VAC. Skin: Negative for rash. Neurological: Negative for headaches. No focal numbness, tingling or weakness.     ____________________________________________   PHYSICAL EXAM:  VITAL SIGNS: ED Triage Vitals  Enc Vitals Group     BP 01/10/18 1643 (!) 130/97     Pulse Rate 01/10/18 1643 (!) 114     Resp 01/10/18 1643 16     Temp 01/10/18 1643 98.5 F (36.9 C)     Temp Source 01/10/18 1643 Oral     SpO2 01/10/18 1643 99 %     Weight 01/10/18 1644 140 lb (63.5 kg)     Height 01/10/18 1644 5\' 6"  (1.676 m)     Head Circumference --      Peak  Flow --      Pain Score 01/10/18 1644 10     Pain Loc --      Pain Edu? --      Excl. in [place]? --     Constitutional: Alert and oriented. Answers questions appropriately.  Chronically ill-appearing and uncomfortable. Eyes: Conjunctivae are normal.  EOMI. No scleral icterus. Head: Atraumatic. Nose: No congestion/rhinnorhea. Mouth/Throat: Mucous membranes are dry.  Poor dentition diffusely. Neck: No stridor.  Supple.  No JVD.  No meningismus. Cardiovascular: Fast rate, regular rhythm. No murmurs, rubs or gallops.  Respiratory: Normal respiratory effort.  No accessory muscle use or retractions. Lungs CTAB.  No wheezes, rales or ronchi. Gastrointestinal: Soft, and nondistended.  Tender to palpation in the epigastrium and diffusely in the lower abdomen.  No Murphy sign.  No guarding or rebound.  No peritoneal signs. Musculoskeletal: The patient has right greater than left lower extremity edema around the ankle.  I have removed his wound VAC and he has an open wound with amputation of the fourth and fifth digits.  He has well-appearing granulation tissue on the dorsum on the foot but on the plantar aspect, there is some green thick discharge  and an area of skin and underlying tissue which is separating from the remainder of the surgical site.  There is no surrounding erythema or lymphatic streaking.  He has normal DP and PT pulses on the right. Neurologic:  A&Ox3.  Speech is clear.  Face and smile are symmetric.  EOMI.  Moves all extremities well. Skin:  Skin is warm, dry and intact. No rash noted. Psychiatric: Mood and affect are normal.   ____________________________________________   LABS (all labs ordered are listed, but only abnormal results are displayed)  Labs Reviewed  COMPREHENSIVE METABOLIC PANEL - Abnormal; Notable for the following components:      Result Value   Glucose, Bld 224 (*)    BUN 25 (*)    Albumin 3.0 (*)    All other components within normal limits  CBC - Abnormal;  Notable for the following components:   RBC 3.76 (*)    Hemoglobin 10.7 (*)    HCT 32.2 (*)    RDW 15.2 (*)    Platelets 480 (*)    All other components within normal limits  GLUCOSE, CAPILLARY - Abnormal; Notable for the following components:   Glucose-Capillary 226 (*)    All other components within normal limits  CULTURE, BLOOD (ROUTINE X 2)  CULTURE, BLOOD (ROUTINE X 2)  C DIFFICILE QUICK SCREEN W PCR REFLEX  GASTROINTESTINAL PANEL BY PCR, STOOL (REPLACES STOOL CULTURE)  LIPASE, BLOOD  URINALYSIS, COMPLETE (UACMP) WITH MICROSCOPIC  SEDIMENTATION RATE  URINALYSIS, ROUTINE W REFLEX MICROSCOPIC  LACTIC ACID, PLASMA  LACTIC ACID, PLASMA   ____________________________________________  EKG  ED ECG REPORT I, Eula Listen, the attending physician, personally viewed and interpreted this ECG.   Date: 01/10/2018  EKG Time: 1916  Rate: 89  Rhythm: normal sinus rhythm  Axis: normal  Intervals:none  ST&T Change: No STEMI  ____________________________________________  RADIOLOGY  No results found.  ____________________________________________   PROCEDURES  Procedure(s) performed: None  Procedures  Critical Care performed: YES ____________________________________________   INITIAL IMPRESSION / ASSESSMENT AND PLAN / ED COURSE  Pertinent labs & imaging results that were available during my care of the patient were reviewed by me and considered in my medical decision making (see chart for details).  32 y.o. now with a history of type 1 diabetes, fourth and fifth digit amputations with wound VAC, presenting with nausea vomiting diarrhea, abdominal pain, and increasing right foot pain.  Overall, the patient is tachycardic although he is afebrile here.  His right foot does have some evidence of infection on the plantar aspect and he is already being treated with oral antibiotics as an outpatient.  I will order empiric clindamycin, as well as blood cultures and a  urinalysis.  In addition, the patient is having diffuse abdominal pain that does not feel similar to his prior IBS or gastroparesis.  Given the multitude of symptoms that he has, will get a CT examination to rule out any acute intra-abdominal surgical pathology or severe infection although the symptoms may be related to his foot infection.  Plan admission.  CRITICAL CARE Performed by: Eula Listen   Total critical care time: 35 minutes  Critical care time was exclusive of separately billable procedures and treating other patients.  Critical care was necessary to treat or prevent imminent or life-threatening deterioration.  Critical care was time spent personally by me on the following activities: development of treatment plan with patient and/or surrogate as well as nursing, discussions with consultants, evaluation of patient's response to treatment, examination of  patient, obtaining history from patient or surrogate, ordering and performing treatments and interventions, ordering and review of laboratory studies, ordering and review of radiographic studies, pulse oximetry and re-evaluation of patient's condition.  ----------------------------------------- 8:49 PM on 01/10/2018 -----------------------------------------  The patient CT scan does not show any acute process which would result in his symptoms.  His blood sugar shows hyperglycemia without DKA.  For the patient's foot, his sed rate is 51, which is half of what it was approximately 1 month ago.  At this time I will continue with admission for intravenous antibiotics.  I have attempted to consult the podiatrist and will keep calling.   ____________________________________________  FINAL CLINICAL IMPRESSION(S) / ED DIAGNOSES  Final diagnoses:  Amputation stump infection ([place])  Nausea vomiting and diarrhea  Hyperglycemia  Sepsis, due to unspecified organism [facility])    Clinical Course as of Jan 11 2047  Tue Jan 10, 2018   [place] Patient does have evidence of hyperglycemia without DKA.  I am awaiting the results of his CT.   [AN]    Clinical Course User Index [AN] Eula Listen, MD      NEW MEDICATIONS STARTED DURING THIS VISIT:  New Prescriptions   No medications on file      Eula Listen, MD 01/10/18 2055

## 2018-01-10 NOTE — ED Notes (Signed)
Pt in [place] called out c/o pain in stomach and foot. Pt tearful at this time. [place],RN aware.

## 2018-01-10 NOTE — Progress Notes (Signed)
CODE SEPSIS - PHARMACY COMMUNICATION  **Broad Spectrum Antibiotics should be administered within 1 hour of Sepsis diagnosis**  Time Code Sepsis Called/Page Received: 1810   Antibiotics Ordered: clindamycin  Time of 1st antibiotic administration: 1910  Additional action taken by pharmacy: spoke with RN who had not yet been made aware of code sepsis  If necessary, Name of Provider/Nurse Contacted: Charlie Pitter ,PharmD Clinical Pharmacist  01/10/2018  7:12 PM

## 2018-01-10 NOTE — ED Notes (Signed)
Pt giving urinal for urine collection and informed of needing stool specimen as well when able to.

## 2018-01-10 NOTE — H&P (Signed)
[facility] [place] NAME: Shawn [name]    MR#:  [id number]  DATE OF BIRTH:  03-09-1986  DATE OF ADMISSION:  01/10/2018  PRIMARY CARE PHYSICIAN: Valera Castle, MD   REQUESTING/REFERRING PHYSICIAN:   CHIEF COMPLAINT:   Chief Complaint  Patient presents with  . Emesis    HISTORY OF PRESENT ILLNESS: Shawn [name]  is a 32 y.o. male with a known history of diabetes type 1, irritable bowel syndrome, gastroparesis.  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 Patient presents to emergency room for increasing pain in the right foot and acute onset of nausea, vomiting, diarrhea and upper abdominal pain, going on for the past 24 hours, gradually getting worse.  He has diffuse abdominal pain, worse in the epigastrium, especially worse after vomiting episodes.  His blood sugar was low in 50s, at home, yesterday. He has been following with podiatry for his right foot wound.  Patient has a wound VAC on and he was started on Cipro and doxycycline few days ago. Patient denies any fever or chills, no bleeding, no chest pain, shortness of breath. Blood test done emergency room are notable for glucose level at 224 and hemoglobin level at 10.7.  CT of the abdomen is unremarkable. Right foot x-ray shows  irregularity of the fifth metatarsal resection margin as well as questionable periosteal reaction of the medial cortex, new from recent postoperative exam, and may represent developing osteomyelitis versus operative healing. Patient is admitted for further evaluation and treatment.  PAST MEDICAL HISTORY:   Past Medical History:  Diagnosis Date  . Diabetes mellitus without complication ([place])   . GERD (gastroesophageal reflux disease)   . IBS (irritable bowel syndrome)     PAST SURGICAL HISTORY:  Past Surgical History:  Procedure Laterality Date  . ABDOMINAL AORTOGRAM W/LOWER EXTREMITY Right  12/23/2017   Procedure: ABDOMINAL AORTOGRAM W/LOWER EXTREMITY;  Surgeon: Katha Cabal, MD;  Location: [facility];  Service: Cardiovascular;  Laterality: Right;  . AMPUTATION Right 12/24/2017   Procedure: AMPUTATION RAY;  Surgeon: Sharlotte Alamo, DPM;  Location: [facility];  Service: Podiatry;  Laterality: Right;  . APPLICATION OF WOUND VAC Right 12/24/2017   Procedure: APPLICATION OF WOUND VAC;  Surgeon: Sharlotte Alamo, DPM;  Location: [facility];  Service: Podiatry;  Laterality: Right;  . APPLICATION OF WOUND VAC Right 01/01/2018   Procedure: APPLICATION OF WOUND VAC;  Surgeon: Albertine Patricia, DPM;  Location: [facility];  Service: Podiatry;  Laterality: Right;  . IRRIGATION AND DEBRIDEMENT FOOT Right 12/20/2017   Procedure: IRRIGATION AND DEBRIDEMENT FOOT;  Surgeon: Sharlotte Alamo, DPM;  Location: [facility];  Service: Podiatry;  Laterality: Right;  . IRRIGATION AND DEBRIDEMENT FOOT Right 12/24/2017   Procedure: IRRIGATION AND DEBRIDEMENT FOOT;  Surgeon: Sharlotte Alamo, DPM;  Location: [facility];  Service: Podiatry;  Laterality: Right;  . IRRIGATION AND DEBRIDEMENT FOOT Right 01/01/2018   Procedure: IRRIGATION AND DEBRIDEMENT FOOT-SKIN,SOFT TISSUE AND BONE;  Surgeon: Albertine Patricia, DPM;  Location: [facility];  Service: Podiatry;  Laterality: Right;    SOCIAL HISTORY:  Social History   Tobacco Use  . Smoking status: Never Smoker  . Smokeless tobacco: Never Used  Substance Use Topics  . Alcohol use: No    Frequency: Never    FAMILY HISTORY:  Family History  Problem Relation Age of Onset  . Diabetes Mother   . Ovarian cancer Mother     DRUG  ALLERGIES:  Allergies  Allergen Reactions  . Banana Hives, Nausea And Vomiting and Rash  . Keflex [Cephalexin] Anaphylaxis  . Onion Hives, Nausea And Vomiting and Rash  . Sulfa Antibiotics Anaphylaxis  . Grapeseed Extract [Nutritional Supplements] Itching  . Shellfish Allergy Hives    "ALL SEAFOOD"    REVIEW OF SYSTEMS:   CONSTITUTIONAL: No  fever, but positive for fatigue and generalized weakness.  EYES: No blurred or double vision.  EARS, NOSE, AND THROAT: No tinnitus or ear pain.  RESPIRATORY: No cough, shortness of breath, wheezing or hemoptysis.  CARDIOVASCULAR: No chest pain, orthopnea, edema.  GASTROINTESTINAL: Positive for nausea, vomiting, diarrhea and abdominal pain.  GENITOURINARY: No dysuria, hematuria.  ENDOCRINE: No polyuria, nocturia,  HEMATOLOGY: No bleeding SKIN: No rash or lesion. MUSCULOSKELETAL: Positive for right foot pain status post recent fourth and fifth digits amputation.   NEUROLOGIC: No focal weakness.  PSYCHIATRY: No anxiety or depression.   MEDICATIONS AT HOME:  Prior to Admission medications   Medication Sig Start Date End Date Taking? Authorizing Provider  ALPRAZolam (XANAX) 0.25 MG tablet Take 1 tablet (0.25 mg total) by mouth 3 (three) times daily as needed for anxiety. 12/27/17  Yes Wieting, Richard, MD  ciprofloxacin (CIPRO) 500 MG tablet Take 1 tablet (500 mg total) by mouth 2 (two) times daily for 14 days. 01/03/18 01/17/18 Yes Sainani, Belia Heman, MD  feeding supplement, GLUCERNA SHAKE, (GLUCERNA SHAKE) LIQD Take 237 mLs by mouth 3 (three) times daily between meals. 12/27/17  Yes Loletha Grayer, MD  ferrous sulfate 325 (65 FE) MG tablet Take 1 tablet (325 mg total) by mouth 2 (two) times daily with a meal. 01/03/18  Yes Sainani, Belia Heman, MD  FLUoxetine (PROZAC) 20 MG capsule Take 1 capsule (20 mg total) by mouth daily. 11/12/17  Yes Gladstone Lighter, MD  insulin glargine (LANTUS) 100 UNIT/ML injection Inject 32 Units into the skin 2 (two) times daily.    Yes [provider]  lisinopril (PRINIVIL,ZESTRIL) 10 MG tablet Take 1 tablet (10 mg total) by mouth daily. 11/12/17  Yes Gladstone Lighter, MD  multivitamin (ONE-A-DAY MEN'S) TABS tablet Take 1 tablet by mouth daily.   Yes [provider]  NOVOLIN R RELION 100 UNIT/ML injection Inject 0-10 Units into the skin 3 (three) times  daily as needed for high blood sugar (sliding scale).  12/27/17  Yes [provider]  oxyCODONE-acetaminophen (PERCOCET/ROXICET) 5-325 MG tablet Take 1 tablet by mouth every 6 (six) hours as needed for moderate pain. Patient taking differently: Take 1 tablet by mouth every 4 (four) hours as needed for moderate pain.  01/03/18  Yes Sainani, Belia Heman, MD  Insulin Syringes, Disposable, U-100 0.3 ML MISC 1 Syringe by Does not apply route 4 (four) times daily -  with meals and at bedtime. 12/27/17   Loletha Grayer, MD  metoCLOPramide (REGLAN) 10 MG tablet Take 1 tablet (10 mg total) by mouth 3 (three) times daily before meals. Patient not taking: Reported on 12/30/2017 12/26/17   Loletha Grayer, MD  polyethylene glycol [facility]) packet Take 17 g by mouth daily as needed for moderate constipation. Patient not taking: Reported on 01/10/2018 12/26/17   Loletha Grayer, MD      PHYSICAL EXAMINATION:   VITAL SIGNS: Blood pressure (!) 129/92, pulse 94, temperature 98.5 F (36.9 C), temperature source Oral, resp. rate 17, height 5\' 6"  (1.676 m), weight 63.5 kg (140 lb), SpO2 100 %.  GENERAL:  32 y.o.-year-old patient lying in the bed with moderate distress, secondary  to right foot pain.  EYES: Pupils equal, round, reactive to light and accommodation. No scleral icterus. Extraocular muscles intact.  HEENT: Head atraumatic, normocephalic. Oropharynx and nasopharynx clear.  NECK:  Supple, no jugular venous distention. No thyroid enlargement, no tenderness.  LUNGS: Normal breath sounds bilaterally, no wheezing, rales,rhonchi or crepitation. No use of accessory muscles of respiration.  CARDIOVASCULAR: S1, S2 normal. No murmurs, rubs, or gallops.  ABDOMEN: Soft, nontender, nondistended. Bowel sounds present. No organomegaly or mass.  EXTREMITIES: Right foot open wound is noted status post amputation of fourth and fifth digits. NEUROLOGIC: No focal weakness. PSYCHIATRIC: The patient is alert and oriented x  3.  SKIN: Right foot open wound is noted status post amputation of fourth and fifth digits.  There is well appearing granulation tissue on the dorsum of the foot, but on the plantar surface, there is some green thick discharge.  No bleeding, no surrounding erythema or lymphatic streaking.   LABORATORY PANEL:   CBC Recent Labs  Lab 01/10/18 1646  WBC 9.7  HGB 10.7*  HCT 32.2*  PLT 480*  MCV 85.6  MCH 28.4  MCHC 33.2  RDW 15.2*   ------------------------------------------------------------------------------------------------------------------  Chemistries  Recent Labs  Lab 01/10/18 1646  NA 139  K 4.3  CL 102  CO2 30  GLUCOSE 224*  BUN 25*  CREATININE 1.20  CALCIUM 9.0  AST 28  ALT 33  ALKPHOS 116  BILITOT 0.3   ------------------------------------------------------------------------------------------------------------------ estimated creatinine clearance is 80.8 mL/min (by C-G formula based on SCr of 1.2 mg/dL). ------------------------------------------------------------------------------------------------------------------ No results for input(s): TSH, T4TOTAL, T3FREE, THYROIDAB in the last 72 hours.  Invalid input(s): FREET3   Coagulation profile No results for input(s): INR, PROTIME in the last 168 hours. ------------------------------------------------------------------------------------------------------------------- No results for input(s): DDIMER in the last 72 hours. -------------------------------------------------------------------------------------------------------------------  Cardiac Enzymes No results for input(s): CKMB, TROPONINI, MYOGLOBIN in the last 168 hours.  Invalid input(s): CK ------------------------------------------------------------------------------------------------------------------ Invalid input(s):  POCBNP  ---------------------------------------------------------------------------------------------------------------  Urinalysis    Component Value Date/Time   COLORURINE AMBER (A) 12/18/2017 1321   APPEARANCEUR CLOUDY (A) 12/18/2017 1321   APPEARANCEUR Clear 10/06/2014 0758   LABSPEC 1.014 12/18/2017 1321   LABSPEC 1.031 10/06/2014 0758   PHURINE 5.0 12/18/2017 1321   GLUCOSEU >=500 (A) 12/18/2017 1321   GLUCOSEU >=500 10/06/2014 0758   HGBUR SMALL (A) 12/18/2017 1321   BILIRUBINUR NEGATIVE 12/18/2017 1321   BILIRUBINUR Negative 10/06/2014 0758   KETONESUR NEGATIVE 12/18/2017 1321   PROTEINUR 100 (A) 12/18/2017 1321   UROBILINOGEN 0.2 03/29/2007 1506   NITRITE NEGATIVE 12/18/2017 1321   LEUKOCYTESUR NEGATIVE 12/18/2017 1321   LEUKOCYTESUR Negative 10/06/2014 0758     RADIOLOGY: Ct Abdomen Pelvis W Contrast  Result Date: 01/10/2018 CLINICAL DATA:  Abdominal pain and diarrhea for 2 days. EXAM: CT ABDOMEN AND PELVIS WITH CONTRAST TECHNIQUE: Multidetector CT imaging of the abdomen and pelvis was performed using the standard protocol following bolus administration of intravenous contrast. CONTRAST:  100 ml OMNIPAQUE IOHEXOL 300 MG/ML  SOLN COMPARISON:  CT abdomen and pelvis 07/13/2017 in 03/30/2007. FINDINGS: Lower chest: Lung bases are clear. No pleural or pericardial effusion. Hepatobiliary: No focal liver abnormality is seen. No gallstones, gallbladder wall thickening, or biliary dilatation. Pancreas: Unremarkable. No pancreatic ductal dilatation or surrounding inflammatory changes. Spleen: Normal in size without focal abnormality. Adrenals/Urinary Tract: Adrenal glands are unremarkable. Kidneys are normal, without renal calculi, focal lesion, or hydronephrosis. Bladder is unremarkable. Stomach/Bowel: Stomach is within normal limits. Appendix appears normal. No evidence of bowel wall thickening, distention,  or inflammatory changes. Vascular/Lymphatic: No significant vascular findings  are present. No enlarged abdominal or pelvic lymph nodes. Swollen appearance of vessels in the left upper quadrant of the abdomen seen on the most recent CT scan is not identified today. Reproductive: Prostate is unremarkable. Other: Diffuse body wall edema is present. No intra-abdominal fluid is identified. No hernia. Musculoskeletal: Negative. IMPRESSION: Body wall edema most compatible with volume overload. The study is otherwise negative. No finding to explain the patient's symptoms. Electronically Signed   By: Inge Rise M.D.   On: 01/10/2018 19:18   Dg Foot Complete Right  Result Date: 01/10/2018 CLINICAL DATA:  Right foot pain. EXAM: RIGHT FOOT COMPLETE - 3+ VIEW COMPARISON:  Radiograph 12/29/2017. FINDINGS: Post amputation of the fourth and fifth rays in the proximal metatarsal shafts. Wound VAC in the operative bed previously seen is not visualized. Some irregularity of the resection margin of the fifth metatarsal is new from prior exam. Questionable minimal periosteal reaction of the medial metatarsal shaft, seen only on the AP view. No tracking soft tissue air. No other acute bony abnormality. Vascular calcifications are danced for age. IMPRESSION: Post fourth and fifth transmetatarsal amputation. Irregularity of the fifth metatarsal resection margin as well as questionable periosteal reaction of the medial cortex, new from recent postoperative exam, and may represent developing osteomyelitis versus operative healing. Electronically Signed   By: Jeb Levering M.D.   On: 01/10/2018 21:14    EKG: Orders placed or performed during the hospital encounter of 01/10/18  . ED EKG 12-Lead  . ED EKG 12-Lead    IMPRESSION AND PLAN:  1.  Acute gastroenteritis.  Will check stool studies.  We will continue supportive treatment with IV fluids, pain medications and antiemetics. 2.  Right foot ulcer, status post recent fourth and fifth digits amputation.  There is suspicion for osteomyelitis, per  x-ray. We will continue wound care per wound team.  We will continue antibiotics with Levaquin and vancomycin IV.  Will check right foot MRI for further evaluation.  Podiatry and infectious disease are consulted. 3.  Diabetes type 1.  Will monitor blood sugars before meals and at bedtime and treat with insulin as needed. 4.  HTN, stable, continue home medications.    All the records are reviewed and case discussed with ED provider. Management plans discussed with the patient, who is in agreement.  CODE STATUS: Full Code Status History    Date Active Date Inactive Code Status Order ID Comments User Context   12/30/2017 0019 01/03/2018 1457 Full Code [id number]  Harrie Foreman, MD Inpatient   12/20/2017 1541 12/27/2017 2147 Full Code [id number]  Sharlotte Alamo, [facility] Inpatient   11/10/2017 0049 11/11/2017 1632 Full Code [id number]  Amelia Jo, MD Inpatient   07/13/2017 2112 07/15/2017 1730 Full Code [id number]  Jules Husbands, MD ED       TOTAL TIME TAKING CARE OF THIS PATIENT: 45 minutes.    Amelia Jo M.D on 01/10/2018 at 9:41 PM  Between 7am to 6pm - Pager - [phone]  After 6pm go to [email or web address] - password [email or web address] [facility]  Office  [phone]  CC: Primary care physician; Valera Castle, MD

## 2018-01-11 ENCOUNTER — Inpatient Hospital Stay: Payer: BLUE CROSS/BLUE SHIELD

## 2018-01-11 LAB — URINALYSIS, COMPLETE (UACMP) WITH MICROSCOPIC
BILIRUBIN URINE: NEGATIVE
Bacteria, UA: NONE SEEN
Glucose, UA: >=500 mg/dL — AB
Hgb urine dipstick: NEGATIVE
Ketones, ur: NEGATIVE mg/dL
Leukocytes, UA: NEGATIVE
Nitrite: NEGATIVE
PROTEIN: 100 mg/dL — AB
SPECIFIC GRAVITY, URINE: 1.035 — AB (ref 1.005–1.030)
SQUAMOUS EPITHELIAL / LPF: NONE SEEN (ref 0–5)
pH: 5 (ref 5.0–8.0)

## 2018-01-11 LAB — BASIC METABOLIC PANEL
Anion gap: 4 — ABNORMAL LOW (ref 5–15)
BUN: 22 mg/dL — ABNORMAL HIGH (ref 6–20)
CALCIUM: 8.6 mg/dL — AB (ref 8.9–10.3)
CHLORIDE: 104 mmol/L (ref 98–111)
CO2: 31 mmol/L (ref 22–32)
Creatinine, Ser: 1.15 mg/dL (ref 0.61–1.24)
GFR calc non Af Amer: >60 mL/min (ref >60)
Glucose, Bld: 122 mg/dL — ABNORMAL HIGH (ref 70–99)
Potassium: 4.2 mmol/L (ref 3.5–5.1)
Sodium: 139 mmol/L (ref 135–145)

## 2018-01-11 LAB — CBC
HCT: 25.8 % — ABNORMAL LOW (ref 40.0–52.0)
Hemoglobin: 9.1 g/dL — ABNORMAL LOW (ref 13.0–18.0)
MCH: 30.4 pg (ref 26.0–34.0)
MCHC: 35.3 g/dL (ref 32.0–36.0)
MCV: 86.1 fL (ref 80.0–100.0)
PLATELETS: 342 10*3/uL (ref 150–440)
RBC: 3 MIL/uL — AB (ref 4.40–5.90)
RDW: 15.3 % — ABNORMAL HIGH (ref 11.5–14.5)
WBC: 7 10*3/uL (ref 3.8–10.6)

## 2018-01-11 LAB — GLUCOSE, CAPILLARY
GLUCOSE-CAPILLARY: 80 mg/dL (ref 70–99)
Glucose-Capillary: 84 mg/dL (ref 70–99)

## 2018-01-11 MED ORDER — LEVOFLOXACIN IN D5W 750 MG/150ML IV SOLN
750.0000 mg | INTRAVENOUS | Status: DC
  Administered 2018-01-11: 750 mg via INTRAVENOUS
  Filled 2018-01-11 (×2): qty 150

## 2018-01-11 MED ORDER — METOCLOPRAMIDE HCL 10 MG PO TABS: 10.0000 mg | ORAL_TABLET | Freq: Four times a day (QID) | ORAL | 0 refills | Status: DC | PRN

## 2018-01-11 MED ORDER — GADOBENATE DIMEGLUMINE 529 MG/ML IV SOLN
15.0000 mL | Freq: Once | INTRAVENOUS | Status: AC | PRN
  Administered 2018-01-11: 12 mL via INTRAVENOUS

## 2018-01-11 MED ORDER — LEVOFLOXACIN IN D5W 750 MG/150ML IV SOLN: 750.0000 mg | INTRAVENOUS | Status: DC

## 2018-01-11 MED ORDER — INSULIN ASPART 100 UNIT/ML ~~LOC~~ SOLN: 3.0000 [IU] | Freq: Three times a day (TID) | SUBCUTANEOUS | Status: DC

## 2018-01-11 MED ORDER — HYDROMORPHONE HCL 1 MG/ML IJ SOLN: 0.5000 mg | Freq: Three times a day (TID) | INTRAMUSCULAR | Status: DC | PRN

## 2018-01-11 MED ORDER — VANCOMYCIN HCL IN DEXTROSE 1-5 GM/200ML-% IV SOLN
1000.0000 mg | Freq: Two times a day (BID) | INTRAVENOUS | Status: DC
  Administered 2018-01-11: 1000 mg via INTRAVENOUS
  Filled 2018-01-11 (×2): qty 200

## 2018-01-11 MED ORDER — INSULIN GLARGINE 100 UNIT/ML ~~LOC~~ SOLN
22.0000 [IU] | Freq: Two times a day (BID) | SUBCUTANEOUS | Status: DC
  Filled 2018-01-11: qty 0.22

## 2018-01-11 MED ORDER — VANCOMYCIN HCL IN DEXTROSE 1-5 GM/200ML-% IV SOLN
1000.0000 mg | Freq: Once | INTRAVENOUS | Status: AC
  Administered 2018-01-11: 1000 mg via INTRAVENOUS
  Filled 2018-01-11: qty 200

## 2018-01-11 NOTE — Progress Notes (Signed)
Discharge instructions reviewed with patient. NPWT placed prior to discharge. Patient tolerated well. Follow up appts and prescription discussed. HH to see patient pending d/c. Patient denies questions at this time. Transported via w/c to private vehicle. Grandfather to transport home.

## 2018-01-11 NOTE — Progress Notes (Signed)
Subjective/Chief Complaint: Patient seen.  Recently admitted yesterday evening stating that he was sent back to the emergency department by his primary care because of some problems with his blood sugars.  Also had some significant diarrhea yesterday.  Continues to have some significant pain with his right foot.  Objective: Vital signs in last 24 hours: Temp:  [97.8 F (36.6 C)-98.9 F (37.2 C)] 98.9 F (37.2 C) (07/10 0814) Pulse Rate:  [79-114] 93 (07/10 0814) Resp:  [10-18] 18 (07/10 0814) BP: (105-150)/(68-101) 150/96 (07/10 0857) SpO2:  [98 %-100 %] 100 % (07/10 0814) Weight:  [62.4 kg (137 lb 9.6 oz)-63.5 kg (140 lb)] 62.4 kg (137 lb 9.6 oz) (07/10 0404) Last BM Date: 01/10/18  Intake/Output from previous day: 07/09 0701 - 07/10 0700 In: -  Out: 700 [Urine:700] Intake/Output this shift: Total I/O In: 1141.3 [I.V.:691.3; IV Piggyback:450] Out: 900 [Urine:900]  No significant erythema or edema in the right foot.  The patient was seen on Monday outpatient and the surgical site appears stable with a continued open wound laterally on the right foot approximately 8 cm x 3 cm with 2 to 3 cm depth as well as a plantar portion of the wound approximately 4 cm x 1.5 cm with a depth of 5 to 6 mm.  Lateral aspect of the wound appears approximately 50% granular with some fibrotic devitalized tissue around the periphery mostly along the plantar aspect.  No clear progressive necrosis.  Lab Results:  Recent Labs    01/10/18 1646 01/11/18 0438  WBC 9.7 7.0  HGB 10.7* 9.1*  HCT 32.2* 25.8*  PLT 480* 342   BMET Recent Labs    01/10/18 1646 01/11/18 0438  NA 139 139  K 4.3 4.2  CL 102 104  CO2 30 31  GLUCOSE 224* 122*  BUN 25* 22*  CREATININE 1.20 1.15  CALCIUM 9.0 8.6*   PT/INR No results for input(s): LABPROT, INR in the last 72 hours. ABG No results for input(s): PHART, HCO3 in the last 72 hours.  Invalid input(s): PCO2, PO2  Studies/Results: Ct Abdomen Pelvis W  Contrast  Result Date: 01/10/2018 CLINICAL DATA:  Abdominal pain and diarrhea for 2 days. EXAM: CT ABDOMEN AND PELVIS WITH CONTRAST TECHNIQUE: Multidetector CT imaging of the abdomen and pelvis was performed using the standard protocol following bolus administration of intravenous contrast. CONTRAST:  100 ml OMNIPAQUE IOHEXOL 300 MG/ML  SOLN COMPARISON:  CT abdomen and pelvis 07/13/2017 in 03/30/2007. FINDINGS: Lower chest: Lung bases are clear. No pleural or pericardial effusion. Hepatobiliary: No focal liver abnormality is seen. No gallstones, gallbladder wall thickening, or biliary dilatation. Pancreas: Unremarkable. No pancreatic ductal dilatation or surrounding inflammatory changes. Spleen: Normal in size without focal abnormality. Adrenals/Urinary Tract: Adrenal glands are unremarkable. Kidneys are normal, without renal calculi, focal lesion, or hydronephrosis. Bladder is unremarkable. Stomach/Bowel: Stomach is within normal limits. Appendix appears normal. No evidence of bowel wall thickening, distention, or inflammatory changes. Vascular/Lymphatic: No significant vascular findings are present. No enlarged abdominal or pelvic lymph nodes. Swollen appearance of vessels in the left upper quadrant of the abdomen seen on the most recent CT scan is not identified today. Reproductive: Prostate is unremarkable. Other: Diffuse body wall edema is present. No intra-abdominal fluid is identified. No hernia. Musculoskeletal: Negative. IMPRESSION: Body wall edema most compatible with volume overload. The study is otherwise negative. No finding to explain the patient's symptoms. Electronically Signed   By: Inge Rise M.D.   On: 01/10/2018 19:18   Mr Foot  Right W Wo Contrast  Result Date: 01/11/2018 CLINICAL DATA:  Increasing right foot pain. Recent amputation of fourth and fifth rays at the level of the proximal metatarsals. Surgical wound with wound VAC in place. EXAM: MRI OF THE RIGHT FOREFOOT WITHOUT AND WITH  CONTRAST TECHNIQUE: Multiplanar, multisequence MR imaging of the right forefoot was performed before and after the administration of intravenous contrast. CONTRAST:  19mL MULTIHANCE GADOBENATE DIMEGLUMINE 529 MG/ML IV SOLN COMPARISON:  Radiographs dated 01/10/2018 and 12/29/2017 and MRI dated 12/19/2017 FINDINGS: Bones/Joint/Cartilage There is new extensive edema in the cuboid with enhancement after contrast. There is also edema and enhancement of the remnant of the shafts of the fourth and fifth metatarsals. Surgical metallic artifact at the stump of the fifth metatarsal. Muscles and Tendons There is slight enhancement of the along the stump of the flexor tendons to the fifth digit. Soft tissues Slight subcutaneous edema on the dorsum of the foot. No definable abscesses. Soft tissue wound at the site of prior surgery. IMPRESSION: 1. The edema and enhancement in the stumps of the fourth and fifth metatarsals can normally be seen in this postsurgical period. However, the radiographs do show some fragmentation stump of the fifth metatarsal. If the patient has not had interval debridement since the radiographs of 12/29/2017, then this finding is consistent with osteomyelitis. 2. New edema and enhancement in the cuboid without a definable fracture bone destruction. This could represent a stress reaction due to and abnormal gait due to the prior amputations. I think this is less likely to represent osteomyelitis. Electronically Signed   By: Lorriane Shire M.D.   On: 01/11/2018 08:56   Dg Foot Complete Right  Result Date: 01/10/2018 CLINICAL DATA:  Right foot pain. EXAM: RIGHT FOOT COMPLETE - 3+ VIEW COMPARISON:  Radiograph 12/29/2017. FINDINGS: Post amputation of the fourth and fifth rays in the proximal metatarsal shafts. Wound VAC in the operative bed previously seen is not visualized. Some irregularity of the resection margin of the fifth metatarsal is new from prior exam. Questionable minimal periosteal reaction  of the medial metatarsal shaft, seen only on the AP view. No tracking soft tissue air. No other acute bony abnormality. Vascular calcifications are danced for age. IMPRESSION: Post fourth and fifth transmetatarsal amputation. Irregularity of the fifth metatarsal resection margin as well as questionable periosteal reaction of the medial cortex, new from recent postoperative exam, and may represent developing osteomyelitis versus operative healing. Electronically Signed   By: Jeb Levering M.D.   On: 01/10/2018 21:14    Anti-infectives: Anti-infectives (From admission, onward)   Start     Dose/Rate Route Frequency Ordered Stop   01/12/18 1000  levofloxacin (LEVAQUIN) IVPB 750 mg     750 mg 100 mL/hr over 90 Minutes Intravenous Every 24 hours 01/11/18 1219     01/11/18 0800  vancomycin (VANCOCIN) IVPB 1000 mg/200 mL premix     1,000 mg 200 mL/hr over 60 Minutes Intravenous Every 12 hours 01/11/18 0152     01/11/18 0200  vancomycin (VANCOCIN) IVPB 1000 mg/200 mL premix     1,000 mg 200 mL/hr over 60 Minutes Intravenous  Once 01/11/18 0148 01/11/18 0300   01/11/18 0200  levofloxacin (LEVAQUIN) IVPB 750 mg  Status:  Discontinued     750 mg 100 mL/hr over 90 Minutes Intravenous Every 24 hours 01/11/18 0157 01/11/18 1219   01/10/18 1815  clindamycin (CLEOCIN) IVPB 900 mg     900 mg 100 mL/hr over 30 Minutes Intravenous  Once 01/10/18 1803 01/10/18  1940      Assessment/Plan: s/p * No surgery found * Assessment: 1.  Stable status post fourth and fifth ray resection and debridement right foot.2.  Diabetes with significant vascular disease.   Plan: Dressing reapplied to the right foot.  Spoke with his nurse and we will have the wound VAC reapplied.  From a foot standpoint he is stable for discharge and continuing with home health care as prior to admission.  Continue to follow outpatient and keep his regularly scheduled appointment.  LOS: 1 day    [name] 01/11/2018

## 2018-01-11 NOTE — Discharge Summary (Signed)
[facility] NAME: Shawn [name]    MR#:  [id number]  DATE OF BIRTH:  08-25-1985  DATE OF ADMISSION:  01/10/2018   ADMITTING PHYSICIAN: Amelia Jo, MD  DATE OF DISCHARGE: 01/11/2018  PRIMARY CARE PHYSICIAN: Valera Castle, MD   ADMISSION DIAGNOSIS:  Hyperglycemia [R73.9] Amputation stump infection ([place]) [T87.40] Nausea vomiting and diarrhea [R11.2, R19.7] Sepsis, due to unspecified organism ([place]) [A41.9] DISCHARGE DIAGNOSIS:  Active Problems:   Osteomyelitis ([place])  SECONDARY DIAGNOSIS:   Past Medical History:  Diagnosis Date  . Diabetes mellitus without complication ([place])   . GERD (gastroesophageal reflux disease)   . IBS (irritable bowel syndrome)    HOSPITAL COURSE:  1.  Acute gastroenteritis or gastroparesis.   Improved with supportive treatment with IV fluids, pain medications and antiemetics. 2.  Right foot ulcer, status post recent fourth and fifth digits amputation.  There is suspicion for osteomyelitis, per x-ray. He is treated with IV Levaquin and vancomycin after admission. Per Dr. Cleda Mccreedy, stable status post fourth and fifth ray resection and debridement right foot.  Reapply wound VAC. From a foot standpoint he is stable for discharge and continuing with home health care as prior to admission.  Resume cipro after discharge.  3.  Diabetes type 1.    He is treated with Lantus and sliding scale. 4.  HTN, stable, continue home medications. DISCHARGE CONDITIONS:  Stable, discharge to home with home health today. CONSULTS OBTAINED:  Treatment Team:  Thayer Headings, MD Sharlotte Alamo, DPM DRUG ALLERGIES:   Allergies  Allergen Reactions  . Banana Hives, Nausea And Vomiting and Rash  . Keflex [Cephalexin] Anaphylaxis  . Onion Hives, Nausea And Vomiting and Rash  . Sulfa Antibiotics Anaphylaxis  . Grapeseed Extract [Nutritional Supplements] Itching  . Shellfish Allergy Hives    "ALL SEAFOOD"   DISCHARGE  MEDICATIONS:   Allergies as of 01/11/2018      Reactions   Banana Hives, Nausea And Vomiting, Rash   Keflex [cephalexin] Anaphylaxis   Onion Hives, Nausea And Vomiting, Rash   Sulfa Antibiotics Anaphylaxis   Grapeseed Extract [nutritional Supplements] Itching   Shellfish Allergy Hives   "ALL SEAFOOD"      Medication List    TAKE these medications   ALPRAZolam 0.25 MG tablet Commonly known as:  XANAX Take 1 tablet (0.25 mg total) by mouth 3 (three) times daily as needed for anxiety.   ciprofloxacin 500 MG tablet Commonly known as:  CIPRO Take 1 tablet (500 mg total) by mouth 2 (two) times daily for 14 days.   feeding supplement (GLUCERNA SHAKE) Liqd Take 237 mLs by mouth 3 (three) times daily between meals.   ferrous sulfate 325 (65 FE) MG tablet Take 1 tablet (325 mg total) by mouth 2 (two) times daily with a meal.   FLUoxetine 20 MG capsule Commonly known as:  PROZAC Take 1 capsule (20 mg total) by mouth daily.   insulin glargine 100 UNIT/ML injection Commonly known as:  LANTUS Inject 32 Units into the skin 2 (two) times daily.   Insulin Syringes (Disposable) U-100 0.3 ML Misc 1 Syringe by Does not apply route 4 (four) times daily -  with meals and at bedtime.   lisinopril 10 MG tablet Commonly known as:  PRINIVIL,ZESTRIL Take 1 tablet (10 mg total) by mouth daily.   metoCLOPramide 10 MG tablet Commonly known as:  REGLAN Take 1 tablet (10 mg total) by mouth every 6 (six) hours as needed  for nausea, vomiting or refractory nausea / vomiting. What changed:    when to take this  reasons to take this   multivitamin Tabs tablet Take 1 tablet by mouth daily.   NOVOLIN R RELION 100 units/mL injection Generic drug:  insulin regular Inject 0-10 Units into the skin 3 (three) times daily as needed for high blood sugar (sliding scale).   oxyCODONE-acetaminophen 5-325 MG tablet Commonly known as:  PERCOCET/ROXICET Take 1 tablet by mouth every 6 (six) hours as needed  for moderate pain. What changed:  when to take this   polyethylene glycol packet Commonly known as:  MIRALAX Take 17 g by mouth daily as needed for moderate constipation.        DISCHARGE INSTRUCTIONS:  See AVS. If you experience worsening of your admission symptoms, develop shortness of breath, life threatening emergency, suicidal or homicidal thoughts you must seek medical attention immediately by calling 911 or calling your MD immediately  if symptoms less severe.  You Must read complete instructions/literature along with all the possible adverse reactions/side effects for all the Medicines you take and that have been prescribed to you. Take any new Medicines after you have completely understood and accpet all the possible adverse reactions/side effects.   Please note  You were cared for by a hospitalist during your hospital stay. If you have any questions about your discharge medications or the care you received while you were in the hospital after you are discharged, you can call the unit and asked to speak with the hospitalist on call if the hospitalist that took care of you is not available. Once you are discharged, your primary care physician will handle any further medical issues. Please note that NO REFILLS for any discharge medications will be authorized once you are discharged, as it is imperative that you return to your primary care physician (or establish a relationship with a primary care physician if you do not have one) for your aftercare needs so that they can reassess your need for medications and monitor your lab values.    On the day of Discharge:  VITAL SIGNS:  Blood pressure (!) 150/96, pulse 93, temperature 98.9 F (37.2 C), temperature source Oral, resp. rate 18, height 5\' 6"  (1.676 m), weight 137 lb 9.6 oz (62.4 kg), SpO2 100 %. PHYSICAL EXAMINATION:  GENERAL:  32 y.o.-year-old patient lying in the bed with no acute distress.  EYES: Pupils equal, round, reactive  to light and accommodation. No scleral icterus. Extraocular muscles intact.  HEENT: Head atraumatic, normocephalic. Oropharynx and nasopharynx clear.  NECK:  Supple, no jugular venous distention. No thyroid enlargement, no tenderness.  LUNGS: Normal breath sounds bilaterally, no wheezing, rales,rhonchi or crepitation. No use of accessory muscles of respiration.  CARDIOVASCULAR: S1, S2 normal. No murmurs, rubs, or gallops.  ABDOMEN: Soft, non-tender, non-distended. Bowel sounds present. No organomegaly or mass.  EXTREMITIES: No pedal edema, cyanosis, or clubbing.  Right foot in dressing. NEUROLOGIC: Cranial nerves II through XII are intact. Muscle strength 5/5 in all extremities. Sensation intact. Gait not checked.  PSYCHIATRIC: The patient is alert and oriented x 3.  SKIN: No obvious rash, lesion, or ulcer.  DATA REVIEW:   CBC Recent Labs  Lab 01/11/18 0438  WBC 7.0  HGB 9.1*  HCT 25.8*  PLT 342    Chemistries  Recent Labs  Lab 01/10/18 1646 01/11/18 0438  NA 139 139  K 4.3 4.2  CL 102 104  CO2 30 31  GLUCOSE 224* 122*  BUN  25* 22*  CREATININE 1.20 1.15  CALCIUM 9.0 8.6*  AST 28  --   ALT 33  --   ALKPHOS 116  --   BILITOT 0.3  --      Microbiology Results  Results for orders placed or performed during the hospital encounter of 01/10/18  Blood culture (routine x 2)     Status: None (Preliminary result)   Collection Time: 01/10/18  7:07 PM  Result Value Ref Range Status   Specimen Description BLOOD RIGHT ANTECUBITAL  Final   Special Requests   Final    BOTTLES DRAWN AEROBIC AND ANAEROBIC Blood Culture results may not be optimal due to an excessive volume of blood received in culture bottles   Culture   Final    NO GROWTH < 24 HOURS Performed at [facility], [address] [place], [place] [address]    Report Status PENDING  Incomplete  Blood culture (routine x 2)     Status: None (Preliminary result)   Collection Time: 01/10/18  7:09 PM  Result  Value Ref Range Status   Specimen Description BLOOD BLOOD RIGHT HAND  Final   Special Requests   Final    BOTTLES DRAWN AEROBIC AND ANAEROBIC Blood Culture adequate volume   Culture   Final    NO GROWTH < 24 HOURS Performed at [facility], [address], [place],  [address]    Report Status PENDING  Incomplete    RADIOLOGY:  Ct Abdomen Pelvis W Contrast  Result Date: 01/10/2018 CLINICAL DATA:  Abdominal pain and diarrhea for 2 days. EXAM: CT ABDOMEN AND PELVIS WITH CONTRAST TECHNIQUE: Multidetector CT imaging of the abdomen and pelvis was performed using the standard protocol following bolus administration of intravenous contrast. CONTRAST:  100 ml OMNIPAQUE IOHEXOL 300 MG/ML  SOLN COMPARISON:  CT abdomen and pelvis 07/13/2017 in 03/30/2007. FINDINGS: Lower chest: Lung bases are clear. No pleural or pericardial effusion. Hepatobiliary: No focal liver abnormality is seen. No gallstones, gallbladder wall thickening, or biliary dilatation. Pancreas: Unremarkable. No pancreatic ductal dilatation or surrounding inflammatory changes. Spleen: Normal in size without focal abnormality. Adrenals/Urinary Tract: Adrenal glands are unremarkable. Kidneys are normal, without renal calculi, focal lesion, or hydronephrosis. Bladder is unremarkable. Stomach/Bowel: Stomach is within normal limits. Appendix appears normal. No evidence of bowel wall thickening, distention, or inflammatory changes. Vascular/Lymphatic: No significant vascular findings are present. No enlarged abdominal or pelvic lymph nodes. Swollen appearance of vessels in the left upper quadrant of the abdomen seen on the most recent CT scan is not identified today. Reproductive: Prostate is unremarkable. Other: Diffuse body wall edema is present. No intra-abdominal fluid is identified. No hernia. Musculoskeletal: Negative. IMPRESSION: Body wall edema most compatible with volume overload. The study is otherwise negative. No finding to  explain the patient's symptoms. Electronically Signed   By: Inge Rise M.D.   On: 01/10/2018 19:18   Mr Foot Right W Wo Contrast  Result Date: 01/11/2018 CLINICAL DATA:  Increasing right foot pain. Recent amputation of fourth and fifth rays at the level of the proximal metatarsals. Surgical wound with wound VAC in place. EXAM: MRI OF THE RIGHT FOREFOOT WITHOUT AND WITH CONTRAST TECHNIQUE: Multiplanar, multisequence MR imaging of the right forefoot was performed before and after the administration of intravenous contrast. CONTRAST:  37mL MULTIHANCE GADOBENATE DIMEGLUMINE 529 MG/ML IV SOLN COMPARISON:  Radiographs dated 01/10/2018 and 12/29/2017 and MRI dated 12/19/2017 FINDINGS: Bones/Joint/Cartilage There is new extensive edema in the cuboid with enhancement after contrast. There  is also edema and enhancement of the remnant of the shafts of the fourth and fifth metatarsals. Surgical metallic artifact at the stump of the fifth metatarsal. Muscles and Tendons There is slight enhancement of the along the stump of the flexor tendons to the fifth digit. Soft tissues Slight subcutaneous edema on the dorsum of the foot. No definable abscesses. Soft tissue wound at the site of prior surgery. IMPRESSION: 1. The edema and enhancement in the stumps of the fourth and fifth metatarsals can normally be seen in this postsurgical period. However, the radiographs do show some fragmentation stump of the fifth metatarsal. If the patient has not had interval debridement since the radiographs of 12/29/2017, then this finding is consistent with osteomyelitis. 2. New edema and enhancement in the cuboid without a definable fracture bone destruction. This could represent a stress reaction due to and abnormal gait due to the prior amputations. I think this is less likely to represent osteomyelitis. Electronically Signed   By: Lorriane Shire M.D.   On: 01/11/2018 08:56   Dg Foot Complete Right  Result Date: 01/10/2018 CLINICAL  DATA:  Right foot pain. EXAM: RIGHT FOOT COMPLETE - 3+ VIEW COMPARISON:  Radiograph 12/29/2017. FINDINGS: Post amputation of the fourth and fifth rays in the proximal metatarsal shafts. Wound VAC in the operative bed previously seen is not visualized. Some irregularity of the resection margin of the fifth metatarsal is new from prior exam. Questionable minimal periosteal reaction of the medial metatarsal shaft, seen only on the AP view. No tracking soft tissue air. No other acute bony abnormality. Vascular calcifications are danced for age. IMPRESSION: Post fourth and fifth transmetatarsal amputation. Irregularity of the fifth metatarsal resection margin as well as questionable periosteal reaction of the medial cortex, new from recent postoperative exam, and may represent developing osteomyelitis versus operative healing. Electronically Signed   By: Jeb Levering M.D.   On: 01/10/2018 21:14    Management plans discussed with the patient, family and they are in agreement.  CODE STATUS: Full Code   TOTAL TIME TAKING CARE OF THIS PATIENT: 33 minutes.    Demetrios Loll M.D on 01/11/2018 at 3:35 PM  Between 7am to 6pm - Pager - [phone]  After 6pm go to [email or web address] - Proofreader  [facility]  Office  [phone]  CC: Primary care physician; Valera Castle, MD   Note: This dictation was prepared with Dragon dictation along with smaller phrase technology. Any transcriptional errors that result from this process are unintentional.

## 2018-01-11 NOTE — Discharge Instructions (Signed)
HHPT. Wound care by wound care nurse.

## 2018-01-11 NOTE — Consult Note (Signed)
[facility] Nurse wound consult note Reason for Consult: Presents to ED for right foot pain after amputation of fourth and fifth metatarsal.  NPWT (VAC) therapy applied 01/09/18.  VAC has been removed when I arrive today.  Awaiting podiatry consult.  Will apply after seen, per MD order  Wound type:Surgical vs infectious Pressure Injury POA: NA Measurement: 1.4 cm x 5 cm x 0.4 cm  Wound YFV:CBSWH red Drainage (amount, consistency, odor) minimal serosanguinous  No odor.  Periwound:erythema and tenderness Dressing procedure/placement/frequency: NPWT black foam to wound bed.  Change Mon/Wed/Fri.  Setting 125 mmHg.  [place] team will follow.  Domenic Moras RN BSN [place] Pager [phone]

## 2018-01-11 NOTE — Care Management Note (Signed)
Case Management Note  Patient Details  Name: Shawn [name] MRN: [id number] Date of Birth: 10/06/1985  Subjective/Objective:   Spoke with Shawn [name] with [facility] who will resume home health services of RN, PT, SW. Patient need the sponge to his wound vac for discharge. Shawn [name] is checking to see if he can get it prior to discharge for primary nurse to place wound vac. Awaiting response.                 Action/Plan:   Expected Discharge Date:  01/11/18               Expected Discharge Plan:  [place]  In-House Referral:     Discharge planning Services  CM Consult  Post Acute Care Choice:  Home Health, Resumption of Svcs/PTA Provider Choice offered to:  Patient  DME Arranged:    DME Agency:     HH Arranged:  RN, PT, Social Work [employer] Agency:  [facility]  Status of Service:  Completed, signed off  If discussed at [employer], dates discussed:    Additional Comments:  Jolly Mango, RN 01/11/2018, 2:05 PM

## 2018-01-11 NOTE — Progress Notes (Addendum)
Inpatient Diabetes Program Recommendations  AACE/ADA: New Consensus Statement on Inpatient Glycemic Control (2015)  Target Ranges:  Prepandial:   less than 140 mg/dL      Peak postprandial:   less than 180 mg/dL (1-2 hours)      Critically ill patients:  140 - 180 mg/dL   Lab Results  Component Value Date   GLUCAP 80 01/11/2018   HGBA1C 16.9 (H) 12/19/2017    Review of Glycemic ControlResults for Shawn, [name] (MRN [id number]) as of 01/11/2018 11:41  Ref. Range 01/10/2018 16:48 01/10/2018 23:04 01/10/2018 23:28 01/11/2018 08:13  Glucose-Capillary Latest Ref Range: 70 - 99 mg/dL 226 (H) 56 (L) 103 (H) 80    Diabetes history: Type 1 DM  Outpatient Diabetes medications:  Lantus 32 units bid, Relion 0-10 units tid with meals Current orders for Inpatient glycemic control: Novolog sensitive tid with meals and HS, Lantus 32 units bid Inpatient Diabetes Program Recommendations:    Please reduce Lantus to 22 units daily.  Also consider adding Novolog meal coverage 3 units tid with meals. Text page sent to MD.  Thanks,  Adah Perl, RN, BC-ADM Inpatient Diabetes Coordinator Pager [phone] (8a-5p)

## 2018-01-11 NOTE — Progress Notes (Addendum)
ANTIBIOTIC CONSULT NOTE - INITIAL  Pharmacy Consult for Vancomycin , Levaquin  Indication: wound infection  Allergies  Allergen Reactions  . Banana Hives, Nausea And Vomiting and Rash  . Keflex [Cephalexin] Anaphylaxis  . Onion Hives, Nausea And Vomiting and Rash  . Sulfa Antibiotics Anaphylaxis  . Grapeseed Extract [Nutritional Supplements] Itching  . Shellfish Allergy Hives    "ALL SEAFOOD"    Patient Measurements: Height: 5\' 6"  (167.6 cm) Weight: 137 lb 11.2 oz (62.5 kg) IBW/kg (Calculated) : 63.8 Adjusted Body Weight: 63.3 kg   Vital Signs: Temp: 97.8 F (36.6 C) (07/09 2255) Temp Source: Oral (07/09 2255) BP: 132/94 (07/09 2255) Pulse Rate: 87 (07/09 2255) Intake/Output from previous day: No intake/output data recorded. Intake/Output from this shift: No intake/output data recorded.  Labs: Recent Labs    01/10/18 1646  WBC 9.7  HGB 10.7*  PLT 480*  CREATININE 1.20   Estimated Creatinine Clearance: 79.6 mL/min (by C-G formula based on SCr of 1.2 mg/dL). No results for input(s): VANCOTROUGH, VANCOPEAK, VANCORANDOM, GENTTROUGH, GENTPEAK, GENTRANDOM, TOBRATROUGH, TOBRAPEAK, TOBRARND, AMIKACINPEAK, AMIKACINTROU, AMIKACIN in the last 72 hours.   Microbiology: Recent Results (from the past 720 hour(s))  Blood culture (routine x 2)     Status: None   Collection Time: 12/18/17 12:55 PM  Result Value Ref Range Status   Specimen Description BLOOD R AC  Final   Special Requests   Final    BOTTLES DRAWN AEROBIC AND ANAEROBIC Blood Culture adequate volume   Culture   Final    NO GROWTH 5 DAYS Performed at [facility], [address], [place], [place] [address]    Report Status 12/23/2017 FINAL  Final  Blood culture (routine x 2)     Status: None   Collection Time: 12/18/17  1:00 PM  Result Value Ref Range Status   Specimen Description BLOOD L FOREARM  Final   Special Requests   Final    BOTTLES DRAWN AEROBIC AND ANAEROBIC Blood Culture adequate volume    Culture   Final    NO GROWTH 5 DAYS Performed at [facility], [address], [place], [place] [address]    Report Status 12/23/2017 FINAL  Final  Culture, blood (x 2)     Status: None   Collection Time: 12/18/17  3:43 PM  Result Value Ref Range Status   Specimen Description BLOOD R HAND  Final   Special Requests   Final    BOTTLES DRAWN AEROBIC AND ANAEROBIC Blood Culture adequate volume   Culture   Final    NO GROWTH 5 DAYS Performed at [facility], [address] [place], [place] [address]    Report Status 12/23/2017 FINAL  Final  Culture, blood (x 2)     Status: None   Collection Time: 12/18/17  3:43 PM  Result Value Ref Range Status   Specimen Description BLOOD R HAND  Final   Special Requests   Final    BOTTLES DRAWN AEROBIC AND ANAEROBIC Blood Culture adequate volume   Culture   Final    NO GROWTH 5 DAYS Performed at [facility], [address] [place],  [address]    Report Status 12/23/2017 FINAL  Final  Aerobic/Anaerobic Culture (surgical/deep wound)     Status: None   Collection Time: 12/19/17  4:59 AM  Result Value Ref Range Status   Specimen Description   Final    WOUND RIGHT FOOT Performed at [facility], [place], [place]  [address]    Special Requests   Final    NONE Performed at [facility], [place], [place] [address]    Gram Stain   Final    NO WBC SEEN RARE GRAM POSITIVE COCCI IN PAIRS RARE GRAM POSITIVE RODS Performed at [facility], [place] [address] [place], [place] [address]    Culture   Final    FEW STAPHYLOCOCCUS AUREUS WITHIN MIXED ORGANISMS MIXED ANAEROBIC FLORA PRESENT.  CALL LAB IF FURTHER IID REQUIRED.    Report Status 12/23/2017 FINAL  Final   Organism ID, Bacteria STAPHYLOCOCCUS AUREUS  Final      Susceptibility   Staphylococcus aureus - MIC*    CIPROFLOXACIN <=0.5 SENSITIVE Sensitive     ERYTHROMYCIN 0.5 SENSITIVE Sensitive      GENTAMICIN <=0.5 SENSITIVE Sensitive     OXACILLIN <=0.25 SENSITIVE Sensitive     TETRACYCLINE <=1 SENSITIVE Sensitive     VANCOMYCIN 1 SENSITIVE Sensitive     TRIMETH/SULFA <=10 SENSITIVE Sensitive     CLINDAMYCIN <=0.25 SENSITIVE Sensitive     RIFAMPIN <=0.5 SENSITIVE Sensitive     Inducible Clindamycin NEGATIVE Sensitive     * FEW STAPHYLOCOCCUS AUREUS  Aerobic/Anaerobic Culture (surgical/deep wound)     Status: None   Collection Time: 12/20/17  1:21 PM  Result Value Ref Range Status   Specimen Description   Final    WOUND RIGHT FOOT ABSCESS Performed at [facility], [address], [place], [place] [address]    Special Requests   Final    NONE Performed at [facility], [place], [place] [address]    Gram Stain   Final    RARE WBC PRESENT,BOTH PMN AND MONONUCLEAR FEW GRAM POSITIVE COCCI    Culture   Final    MODERATE STREPTOCOCCUS AGALACTIAE TESTING AGAINST S. AGALACTIAE NOT ROUTINELY PERFORMED DUE TO PREDICTABILITY OF AMP/PEN/VAN SUSCEPTIBILITY. RARE STAPHYLOCOCCUS AUREUS NO ANAEROBES ISOLATED Performed at [facility], [place] [address], [place], [place] [address]    Report Status 12/25/2017 FINAL  Final   Organism ID, Bacteria STAPHYLOCOCCUS AUREUS  Final      Susceptibility   Staphylococcus aureus - MIC*    CIPROFLOXACIN <=0.5 SENSITIVE Sensitive     ERYTHROMYCIN <=0.25 SENSITIVE Sensitive     GENTAMICIN <=0.5 SENSITIVE Sensitive     OXACILLIN <=0.25 SENSITIVE Sensitive     TETRACYCLINE <=1 SENSITIVE Sensitive     VANCOMYCIN 1 SENSITIVE Sensitive     TRIMETH/SULFA <=10 SENSITIVE Sensitive     CLINDAMYCIN <=0.25 SENSITIVE Sensitive     RIFAMPIN <=0.5 SENSITIVE Sensitive     Inducible Clindamycin NEGATIVE Sensitive     * RARE STAPHYLOCOCCUS AUREUS  Blood Culture (routine x 2)     Status: None   Collection Time: 12/29/17  8:12 PM  Result Value Ref Range Status   Specimen Description BLOOD BLOOD LEFT FOREARM  Final   Special  Requests   Final    Blood Culture results may not be optimal due to an excessive volume of blood received in culture bottles   Culture   Final    NO GROWTH 5 DAYS Performed at [facility], [address], [place], [place] [address]    Report Status 01/03/2018 FINAL  Final  Blood Culture (routine x 2)     Status: None   Collection Time: 12/29/17  8:39 PM  Result Value Ref Range Status   Specimen Description BLOOD RIGHT ARM  Final  Special Requests   Final    BOTTLES DRAWN AEROBIC AND ANAEROBIC Blood Culture results may not be optimal due to an excessive volume of blood received in culture bottles   Culture   Final    NO GROWTH 5 DAYS Performed at [facility], [address] [place], [place] [address]    Report Status 01/03/2018 FINAL  Final    Medical History: Past Medical History:  Diagnosis Date  . Diabetes mellitus without complication ([place])   . GERD (gastroesophageal reflux disease)   . IBS (irritable bowel syndrome)     Medications:  Medications Prior to Admission  Medication Sig Dispense Refill Last Dose  . ALPRAZolam (XANAX) 0.25 MG tablet Take 1 tablet (0.25 mg total) by mouth 3 (three) times daily as needed for anxiety. 15 tablet 0 01/10/2018 at 0800  . ciprofloxacin (CIPRO) 500 MG tablet Take 1 tablet (500 mg total) by mouth 2 (two) times daily for 14 days. 28 tablet 0 01/10/2018 at 0800  . feeding supplement, GLUCERNA SHAKE, (GLUCERNA SHAKE) LIQD Take 237 mLs by mouth 3 (three) times daily between meals. 60 Can 0 Past Week at Unknown time  . ferrous sulfate 325 (65 FE) MG tablet Take 1 tablet (325 mg total) by mouth 2 (two) times daily with a meal. 60 tablet 0 01/10/2018 at 0800  . FLUoxetine (PROZAC) 20 MG capsule Take 1 capsule (20 mg total) by mouth daily. 30 capsule 3 01/10/2018 at 0800  . insulin glargine (LANTUS) 100 UNIT/ML injection Inject 32 Units into the skin 2 (two) times daily.    01/10/2018 at 0900  . lisinopril (PRINIVIL,ZESTRIL) 10 MG  tablet Take 1 tablet (10 mg total) by mouth daily. 30 tablet 2 01/10/2018 at 0800  . multivitamin (ONE-A-DAY MEN'S) TABS tablet Take 1 tablet by mouth daily.   01/10/2018 at 0800  . NOVOLIN R RELION 100 UNIT/ML injection Inject 0-10 Units into the skin 3 (three) times daily as needed for high blood sugar (sliding scale).   0 Past Week at Unknown time  . oxyCODONE-acetaminophen (PERCOCET/ROXICET) 5-325 MG tablet Take 1 tablet by mouth every 6 (six) hours as needed for moderate pain. (Patient taking differently: Take 1 tablet by mouth every 4 (four) hours as needed for moderate pain. ) 20 tablet 0 01/10/2018 at 0800  . Insulin Syringes, Disposable, U-100 0.3 ML MISC 1 Syringe by Does not apply route 4 (four) times daily -  with meals and at bedtime. 100 each 0   . metoCLOPramide (REGLAN) 10 MG tablet Take 1 tablet (10 mg total) by mouth 3 (three) times daily before meals. (Patient not taking: Reported on 12/30/2017) 120 tablet 0 Not Taking at Unknown time  . polyethylene glycol (MIRALAX) packet Take 17 g by mouth daily as needed for moderate constipation. (Patient not taking: Reported on 01/10/2018) 14 each 0 Not Taking at Unknown time   Assessment: CrCl = 79.6 ml/min Ke = 0.07 hr-1 T1/2 = 9.9 hrs Vd = 43.8 L   Goal of Therapy:  Vancomycin trough level 15-20 mcg/ml  Plan:  Expected duration 7 days with resolution of temperature and/or normalization of WBC  Vancomycin 1 gm IV X 1 to be given on 7/10 @ 0200. Vancomycin 1 gm IV Q12H ordered to start on 7/10 @ 0800, ~ 6 hrs after 1st dose (stacked dosing). Pt will reach Css by 7/12 @ 0200. Will draw 1st trough on 7/12 @ 0730, which will be at Css.   Levaquin 750 mg IV Q24H ordered  to start on 7/10 @ 0200.   Shawn [name] D 01/11/2018,1:53 AM

## 2018-01-15 LAB — CULTURE, BLOOD (ROUTINE X 2)
Culture: NO GROWTH
Culture: NO GROWTH
SPECIAL REQUESTS: ADEQUATE

## 2018-01-16 DIAGNOSIS — I10 Essential (primary) hypertension: Secondary | ICD-10-CM

## 2018-01-16 DIAGNOSIS — E1159 Type 2 diabetes mellitus with other circulatory complications: Secondary | ICD-10-CM | POA: Insufficient documentation

## 2018-01-18 ENCOUNTER — Emergency Department
Admission: EM | Admit: 2018-01-18 | Discharge: 2018-01-18 | Disposition: A | Discharge status: Home / Self Care | Payer: Medicaid Other | Attending: Emergency Medicine | Admitting: Emergency Medicine

## 2018-01-18 ENCOUNTER — Other Ambulatory Visit: Payer: Self-pay

## 2018-01-18 ENCOUNTER — Emergency Department: Payer: Medicaid Other

## 2018-01-18 ENCOUNTER — Encounter: Payer: Self-pay | Admitting: *Deleted

## 2018-01-18 DIAGNOSIS — Z89421 Acquired absence of other right toe(s): Secondary | ICD-10-CM | POA: Diagnosis not present

## 2018-01-18 DIAGNOSIS — N289 Disorder of kidney and ureter, unspecified: Secondary | ICD-10-CM

## 2018-01-18 DIAGNOSIS — E119 Type 2 diabetes mellitus without complications: Secondary | ICD-10-CM | POA: Insufficient documentation

## 2018-01-18 DIAGNOSIS — Z79899 Other long term (current) drug therapy: Secondary | ICD-10-CM | POA: Diagnosis not present

## 2018-01-18 DIAGNOSIS — R609 Edema, unspecified: Secondary | ICD-10-CM

## 2018-01-18 DIAGNOSIS — Z794 Long term (current) use of insulin: Secondary | ICD-10-CM | POA: Insufficient documentation

## 2018-01-18 DIAGNOSIS — M79605 Pain in left leg: Secondary | ICD-10-CM

## 2018-01-18 DIAGNOSIS — R600 Localized edema: Secondary | ICD-10-CM | POA: Insufficient documentation

## 2018-01-18 DIAGNOSIS — M79662 Pain in left lower leg: Secondary | ICD-10-CM | POA: Insufficient documentation

## 2018-01-18 LAB — CBC WITH DIFFERENTIAL/PLATELET
Basophils Absolute: 0.1 10*3/uL (ref 0–0.1)
Basophils Relative: 1 %
EOS ABS: 0.2 10*3/uL (ref 0–0.7)
Eosinophils Relative: 3 %
HEMATOCRIT: 31.4 % — AB (ref 40.0–52.0)
Hemoglobin: 10.7 g/dL — ABNORMAL LOW (ref 13.0–18.0)
LYMPHS PCT: 26 %
Lymphs Abs: 1.7 10*3/uL (ref 1.0–3.6)
MCH: 29.2 pg (ref 26.0–34.0)
MCHC: 34.2 g/dL (ref 32.0–36.0)
MCV: 85.2 fL (ref 80.0–100.0)
MONOS PCT: 10 %
Monocytes Absolute: 0.6 10*3/uL (ref 0.2–1.0)
NEUTROS ABS: 3.9 10*3/uL (ref 1.4–6.5)
Neutrophils Relative %: 60 %
Platelets: 373 10*3/uL (ref 150–440)
RBC: 3.68 MIL/uL — AB (ref 4.40–5.90)
RDW: 14.7 % — ABNORMAL HIGH (ref 11.5–14.5)
WBC: 6.5 10*3/uL (ref 3.8–10.6)

## 2018-01-18 LAB — BASIC METABOLIC PANEL
Anion gap: 10 (ref 5–15)
BUN: 37 mg/dL — ABNORMAL HIGH (ref 6–20)
CHLORIDE: 98 mmol/L (ref 98–111)
CO2: 29 mmol/L (ref 22–32)
Calcium: 9.3 mg/dL (ref 8.9–10.3)
Creatinine, Ser: 1.64 mg/dL — ABNORMAL HIGH (ref 0.61–1.24)
GFR calc Af Amer: >60 mL/min (ref >60)
GFR calc non Af Amer: 55 mL/min — ABNORMAL LOW (ref >60)
Glucose, Bld: 303 mg/dL — ABNORMAL HIGH (ref 70–99)
POTASSIUM: 5.1 mmol/L (ref 3.5–5.1)
SODIUM: 137 mmol/L (ref 135–145)

## 2018-01-18 MED ORDER — OXYCODONE-ACETAMINOPHEN 5-325 MG PO TABS
1.0000 | ORAL_TABLET | Freq: Once | ORAL | Status: AC
  Administered 2018-01-18: 1 via ORAL
  Filled 2018-01-18: qty 1

## 2018-01-18 MED ORDER — SODIUM CHLORIDE 0.9 % IV BOLUS
1000.0000 mL | Freq: Once | INTRAVENOUS | Status: AC
  Administered 2018-01-18: 1000 mL via INTRAVENOUS

## 2018-01-18 NOTE — ED Triage Notes (Signed)
Pt to ED with left lower leg swelling x 4 days with pain starting yesterday. Pain is worst in his calf and behind his knee. 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 Wound vac in pace and no complications reported. No fevers at home. Color is appropriate and pulse intact.

## 2018-01-18 NOTE — ED Notes (Signed)
Patient transported to X-ray 

## 2018-01-18 NOTE — ED Provider Notes (Signed)
[facility] Emergency Department Provider Note ____________________________________________   First MD Initiated Contact with Patient 01/18/18 1609     (approximate)  I have reviewed the triage vital signs and the nursing notes.   HISTORY  Chief Complaint No chief complaint on file.  HPI Shawn [name] is a 32 y.o. male history of diabetes status post recent right foot toe amputation was presenting to the emergency department today with left ankle and calf swelling over the past 4 days.  He states the pain is a 10 out of 10.  Denies any injury to this area.  Is not claiming any pain to the right lower extremity.  He has a wound VAC in place.  Past Medical History:  Diagnosis Date  . Diabetes mellitus without complication ([place])   . GERD (gastroesophageal reflux disease)   . IBS (irritable bowel syndrome)     Patient Active Problem List   Diagnosis Date Noted  . Osteomyelitis ([place]) 01/10/2018  . Cellulitis 12/29/2017  . Sepsis ([place]) 12/18/2017  . Moderate recurrent major depression ([place]) 11/10/2017  . Type 2 diabetes mellitus with hyperosmolar nonketotic hyperglycemia ([place]) 11/09/2017  . History of migraine 07/15/2017  . IBS (irritable bowel syndrome) 07/15/2017  . Protein-calorie malnutrition, severe 07/14/2017  . Abdominal pain 07/13/2017  . GERD (gastroesophageal reflux disease) 12/30/2009  . Diabetes ([place]) 11/25/2009  . MDD (major depressive disorder) 11/25/2009  . Hypercholesterolemia 03/07/2008    Past Surgical History:  Procedure Laterality Date  . ABDOMINAL AORTOGRAM W/LOWER EXTREMITY Right 12/23/2017   Procedure: ABDOMINAL AORTOGRAM W/LOWER EXTREMITY;  Surgeon: Katha Cabal, MD;  Location: [facility];  Service: Cardiovascular;  Laterality: Right;  . AMPUTATION Right 12/24/2017   Procedure: AMPUTATION RAY;  Surgeon: Sharlotte Alamo, DPM;  Location: [facility];  Service: Podiatry;  Laterality: Right;  . APPLICATION OF WOUND VAC  Right 12/24/2017   Procedure: APPLICATION OF WOUND VAC;  Surgeon: Sharlotte Alamo, DPM;  Location: [facility];  Service: Podiatry;  Laterality: Right;  . APPLICATION OF WOUND VAC Right 01/01/2018   Procedure: APPLICATION OF WOUND VAC;  Surgeon: Albertine Patricia, DPM;  Location: [facility];  Service: Podiatry;  Laterality: Right;  . IRRIGATION AND DEBRIDEMENT FOOT Right 12/20/2017   Procedure: IRRIGATION AND DEBRIDEMENT FOOT;  Surgeon: Sharlotte Alamo, DPM;  Location: [facility];  Service: Podiatry;  Laterality: Right;  . IRRIGATION AND DEBRIDEMENT FOOT Right 12/24/2017   Procedure: IRRIGATION AND DEBRIDEMENT FOOT;  Surgeon: Sharlotte Alamo, DPM;  Location: [facility];  Service: Podiatry;  Laterality: Right;  . IRRIGATION AND DEBRIDEMENT FOOT Right 01/01/2018   Procedure: IRRIGATION AND DEBRIDEMENT FOOT-SKIN,SOFT TISSUE AND BONE;  Surgeon: Albertine Patricia, DPM;  Location: [facility];  Service: Podiatry;  Laterality: Right;    Prior to Admission medications   Medication Sig Start Date End Date Taking? Authorizing Provider  ALPRAZolam (XANAX) 0.25 MG tablet Take 1 tablet (0.25 mg total) by mouth 3 (three) times daily as needed for anxiety. 12/27/17  Yes Wieting, Richard, MD  ciprofloxacin (CIPRO) 500 MG tablet Take 500 mg by mouth 2 (two) times daily. For 14 days 01/11/18 02/01/18 Yes [provider]  doxycycline (VIBRA-TABS) 100 MG tablet Take 100 mg by mouth 2 (two) times daily. For 14 days 01/06/18 01/20/18 Yes [provider]  feeding supplement, GLUCERNA SHAKE, (GLUCERNA SHAKE) LIQD Take 237 mLs by mouth 3 (three) times daily between meals. 12/27/17  Yes Wieting, Richard, MD  ferrous sulfate 325 (65 FE) MG tablet Take 1 tablet (325 mg total)  by mouth 2 (two) times daily with a meal. 01/03/18  Yes Sainani, Belia Heman, MD  FLUoxetine (PROZAC) 20 MG capsule Take 1 capsule (20 mg total) by mouth daily. Patient taking differently: Take 20 mg by mouth at bedtime.  11/12/17  Yes [place] Lighter, MD  insulin glargine  (LANTUS) 100 UNIT/ML injection Inject 35 Units into the skin 2 (two) times daily.    Yes [provider]  Insulin Syringes, Disposable, U-100 0.3 ML MISC 1 Syringe by Does not apply route 4 (four) times daily -  with meals and at bedtime. 12/27/17  Yes Wieting, Richard, MD  lisinopril (PRINIVIL,ZESTRIL) 10 MG tablet Take 1 tablet (10 mg total) by mouth daily. Patient taking differently: Take 10 mg by mouth at bedtime.  11/12/17  Yes [place] Lighter, MD  metoCLOPramide (REGLAN) 10 MG tablet Take 1 tablet (10 mg total) by mouth every 6 (six) hours as needed for nausea, vomiting or refractory nausea / vomiting. 01/11/18  Yes Demetrios Loll, MD  multivitamin (ONE-A-DAY MEN'S) TABS tablet Take 1 tablet by mouth at bedtime.    Yes [provider]  NOVOLIN R RELION 100 UNIT/ML injection Inject 0-10 Units into the skin 3 (three) times daily as needed for high blood sugar (sliding scale).  12/27/17  Yes [provider]  oxyCODONE-acetaminophen (PERCOCET/ROXICET) 5-325 MG tablet Take 1 tablet by mouth every 6 (six) hours as needed for moderate pain. Patient taking differently: Take 1 tablet by mouth every 4 (four) hours as needed for moderate pain.  01/03/18  Yes Henreitta Leber, MD  promethazine (PHENERGAN) 25 MG tablet Take 12.5 mg by mouth every 8 (eight) hours as needed for nausea or vomiting.    Yes [provider]  polyethylene glycol (MIRALAX) packet Take 17 g by mouth daily as needed for moderate constipation. Patient not taking: Reported on 01/10/2018 12/26/17   Loletha Grayer, MD    Allergies Banana; Keflex [cephalexin]; Onion; Sulfa antibiotics; Grapeseed extract [nutritional supplements]; and Shellfish allergy  Family History  Problem Relation Age of Onset  . Diabetes Mother   . Ovarian cancer Mother     Social History Social History   Tobacco Use  . Smoking status: Never Smoker  . Smokeless tobacco: Never Used  Substance Use Topics  . Alcohol use: No     Frequency: Never  . Drug use: Yes    Types: Marijuana    Review of Systems  Constitutional: No fever/chills Eyes: No visual changes. ENT: No sore throat. Cardiovascular: Denies chest pain. Respiratory: Denies shortness of breath. Gastrointestinal: No abdominal pain.  No nausea, no vomiting.  No diarrhea.  No constipation. Genitourinary: Negative for dysuria. Musculoskeletal: Negative for back pain. Skin: Negative for rash. Neurological: Negative for headaches, focal weakness or numbness.   ____________________________________________   PHYSICAL EXAM:  VITAL SIGNS: ED Triage Vitals  Enc Vitals Group     BP 01/18/18 1542 100/70     Pulse Rate 01/18/18 1542 (!) 112     Resp 01/18/18 1542 16     Temp 01/18/18 1542 99 F (37.2 C)     Temp Source 01/18/18 1542 Oral     SpO2 01/18/18 1542 97 %     Weight 01/18/18 1543 137 lb (62.1 kg)     Height 01/18/18 1543 5\' 6"  (1.676 m)     Head Circumference --      Peak Flow --      Pain Score 01/18/18 1543 10     Pain Loc --  Pain Edu? --      Excl. in [place]? --     Constitutional: Alert and oriented. Well appearing and in no acute distress. Eyes: Conjunctivae are normal.  Head: Atraumatic. Nose: No congestion/rhinnorhea. Mouth/Throat: Mucous membranes are moist.  Neck: No stridor.   Cardiovascular: Normal rate, regular rhythm. Grossly normal heart sounds.  Good peripheral circulation with left sided dorsalis pedis as well as posterior tibial pulses that are intact.  Heart rate is 93 bpm in the room. Respiratory: Normal respiratory effort.  No retractions. Lungs CTAB. Gastrointestinal: Soft and nontender. No distention.  Musculoskeletal:   Right foot wrapped in an Ace bandage with wound VAC in place.  The bandages are CDI.  No edema to the right calf.  No tenderness or erythema.  Left ankle with pitting edema with diffuse tenderness to palpation.  No deformity, redness or warmth.  Pulses intact as noted as above.  Mild  tenderness to the posterior calf as well without any erythema, induration or swelling.  Foot on the left without any tenderness to palpation, deformity or ecchymosis.  Neurovascularly intact.  Patient with multiple abrasions to the anterior left shin.  However, there is no surrounding erythema, induration.  There is no tenderness to palpation over the anterior shin area.  No exudate present.  Neurologic:  Normal speech and language. No gross focal neurologic deficits are appreciated. Skin:  Skin is warm, dry and intact. No rash noted. Psychiatric: Mood and affect are normal. Speech and behavior are normal.  ____________________________________________   LABS (all labs ordered are listed, but only abnormal results are displayed)  Labs Reviewed  CBC WITH DIFFERENTIAL/PLATELET - Abnormal; Notable for the following components:      Result Value   RBC 3.68 (*)    Hemoglobin 10.7 (*)    HCT 31.4 (*)    RDW 14.7 (*)    All other components within normal limits  BASIC METABOLIC PANEL - Abnormal; Notable for the following components:   Glucose, Bld 303 (*)    BUN 37 (*)    Creatinine, Ser 1.64 (*)    GFR calc non Af Amer 55 (*)    All other components within normal limits   ____________________________________________  EKG   ____________________________________________  RADIOLOGY  Patient with negative DVT study as well as left ankle x-ray. ____________________________________________   PROCEDURES  Procedure(s) performed:   Procedures  Critical Care performed:   ____________________________________________   INITIAL IMPRESSION / ASSESSMENT AND PLAN / ED COURSE  Pertinent labs & imaging results that were available during my care of the patient were reviewed by me and considered in my medical decision making (see chart for details).  DDX: Cellulitis, ankle fracture, peripheral edema, DVT, DKA, hyperglycemia As part of my medical decision making, I reviewed the following  data within the [place] chart reviewed and Notes from prior ED visits  ----------------------------------------- 6:31 PM on 01/18/2018 -----------------------------------------  Patient found to have renal insufficiency.  Will receive IV fluids.  Patient says that he does have off-and-on swelling to left lower extremity.  Is taking Cipro as well as doxycycline.  Normal white blood cell count.  No obvious cellulitis.  Likely peripheral edema which is recurrent.  Patient to keep his foot elevated.  Will be following up as an outpatient.  Patient aware of the plan for discharge and willing to comply. ____________________________________________   FINAL CLINICAL IMPRESSION(S) / ED DIAGNOSES  Peripheral edema.  Extremity pain.   NEW MEDICATIONS STARTED DURING THIS  VISIT:  New Prescriptions   No medications on file     Note:  This document was prepared using Dragon voice recognition software and may include unintentional dictation errors.     Orbie Pyo, MD 01/18/18 [phone]

## 2018-02-06 ENCOUNTER — Other Ambulatory Visit: Payer: Self-pay

## 2018-02-06 ENCOUNTER — Encounter: Payer: Self-pay | Admitting: Intensive Care

## 2018-02-06 ENCOUNTER — Observation Stay
Admission: EM | Admit: 2018-02-06 | Discharge: 2018-02-07 | Disposition: A | Discharge status: Home Health | Payer: Medicaid Other | Attending: Internal Medicine | Admitting: Internal Medicine

## 2018-02-06 ENCOUNTER — Emergency Department: Payer: Medicaid Other

## 2018-02-06 DIAGNOSIS — Z89431 Acquired absence of right foot: Secondary | ICD-10-CM | POA: Diagnosis not present

## 2018-02-06 DIAGNOSIS — L97529 Non-pressure chronic ulcer of other part of left foot with unspecified severity: Secondary | ICD-10-CM | POA: Diagnosis not present

## 2018-02-06 DIAGNOSIS — E860 Dehydration: Secondary | ICD-10-CM | POA: Insufficient documentation

## 2018-02-06 DIAGNOSIS — E11621 Type 2 diabetes mellitus with foot ulcer: Secondary | ICD-10-CM | POA: Diagnosis present

## 2018-02-06 DIAGNOSIS — Z794 Long term (current) use of insulin: Secondary | ICD-10-CM | POA: Diagnosis not present

## 2018-02-06 DIAGNOSIS — Z79899 Other long term (current) drug therapy: Secondary | ICD-10-CM | POA: Diagnosis not present

## 2018-02-06 DIAGNOSIS — L97519 Non-pressure chronic ulcer of other part of right foot with unspecified severity: Secondary | ICD-10-CM | POA: Insufficient documentation

## 2018-02-06 DIAGNOSIS — E875 Hyperkalemia: Secondary | ICD-10-CM | POA: Insufficient documentation

## 2018-02-06 DIAGNOSIS — F418 Other specified anxiety disorders: Secondary | ICD-10-CM | POA: Diagnosis not present

## 2018-02-06 DIAGNOSIS — E1365 Other specified diabetes mellitus with hyperglycemia: Secondary | ICD-10-CM

## 2018-02-06 DIAGNOSIS — E871 Hypo-osmolality and hyponatremia: Secondary | ICD-10-CM | POA: Diagnosis not present

## 2018-02-06 DIAGNOSIS — R7989 Other specified abnormal findings of blood chemistry: Secondary | ICD-10-CM | POA: Insufficient documentation

## 2018-02-06 DIAGNOSIS — N179 Acute kidney failure, unspecified: Secondary | ICD-10-CM | POA: Diagnosis not present

## 2018-02-06 DIAGNOSIS — E1165 Type 2 diabetes mellitus with hyperglycemia: Secondary | ICD-10-CM | POA: Diagnosis not present

## 2018-02-06 DIAGNOSIS — D638 Anemia in other chronic diseases classified elsewhere: Secondary | ICD-10-CM | POA: Insufficient documentation

## 2018-02-06 DIAGNOSIS — E13621 Other specified diabetes mellitus with foot ulcer: Secondary | ICD-10-CM

## 2018-02-06 DIAGNOSIS — L089 Local infection of the skin and subcutaneous tissue, unspecified: Secondary | ICD-10-CM | POA: Insufficient documentation

## 2018-02-06 DIAGNOSIS — L97429 Non-pressure chronic ulcer of left heel and midfoot with unspecified severity: Secondary | ICD-10-CM

## 2018-02-06 LAB — COMPREHENSIVE METABOLIC PANEL
ALT: 14 U/L (ref 0–44)
AST: 18 U/L (ref 15–41)
Albumin: 3.4 g/dL — ABNORMAL LOW (ref 3.5–5.0)
Alkaline Phosphatase: 103 U/L (ref 38–126)
Anion gap: 10 (ref 5–15)
BUN: 24 mg/dL — ABNORMAL HIGH (ref 6–20)
CHLORIDE: 91 mmol/L — AB (ref 98–111)
CO2: 30 mmol/L (ref 22–32)
CREATININE: 1.58 mg/dL — AB (ref 0.61–1.24)
Calcium: 8.9 mg/dL (ref 8.9–10.3)
GFR calc Af Amer: >60 mL/min (ref >60)
GFR calc non Af Amer: 57 mL/min — ABNORMAL LOW (ref >60)
Glucose, Bld: 529 mg/dL (ref 70–99)
POTASSIUM: 5.3 mmol/L — AB (ref 3.5–5.1)
Sodium: 131 mmol/L — ABNORMAL LOW (ref 135–145)
Total Bilirubin: 0.6 mg/dL (ref 0.3–1.2)
Total Protein: 7 g/dL (ref 6.5–8.1)

## 2018-02-06 LAB — CBC WITH DIFFERENTIAL/PLATELET
Basophils Absolute: 0 10*3/uL (ref 0–0.1)
Basophils Relative: 1 %
Eosinophils Absolute: 0.2 10*3/uL (ref 0–0.7)
Eosinophils Relative: 2 %
HCT: 32.6 % — ABNORMAL LOW (ref 40.0–52.0)
Hemoglobin: 11 g/dL — ABNORMAL LOW (ref 13.0–18.0)
LYMPHS PCT: 17 %
Lymphs Abs: 1.5 10*3/uL (ref 1.0–3.6)
MCH: 28.3 pg (ref 26.0–34.0)
MCHC: 33.8 g/dL (ref 32.0–36.0)
MCV: 83.9 fL (ref 80.0–100.0)
Monocytes Absolute: 0.9 10*3/uL (ref 0.2–1.0)
Monocytes Relative: 10 %
Neutro Abs: 6.5 10*3/uL (ref 1.4–6.5)
Neutrophils Relative %: 70 %
Platelets: 265 10*3/uL (ref 150–440)
RBC: 3.89 MIL/uL — ABNORMAL LOW (ref 4.40–5.90)
RDW: 14.3 % (ref 11.5–14.5)
WBC: 9.2 10*3/uL (ref 3.8–10.6)

## 2018-02-06 LAB — GLUCOSE, CAPILLARY
Glucose-Capillary: 500 mg/dL — ABNORMAL HIGH (ref 70–99)
Glucose-Capillary: 214 mg/dL — ABNORMAL HIGH (ref 70–99)

## 2018-02-06 LAB — LACTIC ACID, PLASMA
LACTIC ACID, VENOUS: 1.6 mmol/L (ref 0.5–1.9)
Lactic Acid, Venous: 2.1 mmol/L (ref 0.5–1.9)

## 2018-02-06 MED ORDER — FERROUS SULFATE 325 (65 FE) MG PO TABS
325.0000 mg | ORAL_TABLET | Freq: Two times a day (BID) | ORAL | Status: DC
  Administered 2018-02-07: 325 mg via ORAL
  Filled 2018-02-06: qty 1

## 2018-02-06 MED ORDER — SODIUM CHLORIDE 0.9 % IV BOLUS
1000.0000 mL | Freq: Once | INTRAVENOUS | Status: AC
Start: 2018-02-06 — End: 2018-02-06
  Administered 2018-02-06: 1000 mL via INTRAVENOUS

## 2018-02-06 MED ORDER — SODIUM CHLORIDE 0.9 % IV BOLUS
1000.0000 mL | Freq: Once | INTRAVENOUS | Status: AC
  Administered 2018-02-06: 1000 mL via INTRAVENOUS

## 2018-02-06 MED ORDER — HEPARIN SODIUM (PORCINE) 5000 UNIT/ML IJ SOLN
5000.0000 [IU] | Freq: Three times a day (TID) | INTRAMUSCULAR | Status: DC
  Administered 2018-02-06 – 2018-02-07 (×2): 5000 [IU] via SUBCUTANEOUS
  Filled 2018-02-06 (×2): qty 1

## 2018-02-06 MED ORDER — ONDANSETRON HCL 4 MG PO TABS
4.0000 mg | ORAL_TABLET | Freq: Four times a day (QID) | ORAL | Status: DC | PRN
  Administered 2018-02-07: 4 mg via ORAL
  Filled 2018-02-06: qty 1

## 2018-02-06 MED ORDER — INSULIN ASPART 100 UNIT/ML ~~LOC~~ SOLN
0.0000 [IU] | Freq: Every day | SUBCUTANEOUS | Status: DC
  Administered 2018-02-06: 2 [IU] via SUBCUTANEOUS
  Filled 2018-02-06: qty 1

## 2018-02-06 MED ORDER — HYDROCODONE-ACETAMINOPHEN 5-325 MG PO TABS
1.0000 | ORAL_TABLET | ORAL | Status: DC | PRN
  Administered 2018-02-06 – 2018-02-07 (×3): 2 via ORAL
  Filled 2018-02-06 (×3): qty 2

## 2018-02-06 MED ORDER — DOXYCYCLINE HYCLATE 100 MG PO TABS: 100.0000 mg | ORAL_TABLET | Freq: Once | ORAL | Status: DC

## 2018-02-06 MED ORDER — SODIUM CHLORIDE 0.9 % IV SOLN
INTRAVENOUS | Status: DC
  Administered 2018-02-06: 75 mL/h via INTRAVENOUS

## 2018-02-06 MED ORDER — ACETAMINOPHEN 650 MG RE SUPP: 650.0000 mg | Freq: Four times a day (QID) | RECTAL | Status: DC | PRN

## 2018-02-06 MED ORDER — KETOROLAC TROMETHAMINE 15 MG/ML IJ SOLN
15.0000 mg | Freq: Four times a day (QID) | INTRAMUSCULAR | Status: DC | PRN
  Administered 2018-02-06 – 2018-02-07 (×2): 15 mg via INTRAVENOUS
  Filled 2018-02-06 (×2): qty 1

## 2018-02-06 MED ORDER — ONDANSETRON HCL 4 MG/2ML IJ SOLN
4.0000 mg | Freq: Once | INTRAMUSCULAR | Status: AC
  Administered 2018-02-06: 4 mg via INTRAVENOUS
  Filled 2018-02-06: qty 2

## 2018-02-06 MED ORDER — ALBUTEROL SULFATE (2.5 MG/3ML) 0.083% IN NEBU: 2.5000 mg | INHALATION_SOLUTION | RESPIRATORY_TRACT | Status: DC | PRN

## 2018-02-06 MED ORDER — CIPROFLOXACIN IN D5W 400 MG/200ML IV SOLN
400.0000 mg | Freq: Two times a day (BID) | INTRAVENOUS | Status: DC
  Administered 2018-02-06 – 2018-02-07 (×2): 400 mg via INTRAVENOUS
  Filled 2018-02-06 (×3): qty 200

## 2018-02-06 MED ORDER — INSULIN ASPART 100 UNIT/ML ~~LOC~~ SOLN
0.0000 [IU] | Freq: Three times a day (TID) | SUBCUTANEOUS | Status: DC
  Administered 2018-02-07: 3 [IU] via SUBCUTANEOUS
  Administered 2018-02-07: 2 [IU] via SUBCUTANEOUS
  Filled 2018-02-06 (×2): qty 1

## 2018-02-06 MED ORDER — INSULIN GLARGINE 100 UNIT/ML ~~LOC~~ SOLN
35.0000 [IU] | Freq: Two times a day (BID) | SUBCUTANEOUS | Status: DC
  Administered 2018-02-06 – 2018-02-07 (×2): 35 [IU] via SUBCUTANEOUS
  Filled 2018-02-06 (×3): qty 0.35

## 2018-02-06 MED ORDER — ALPRAZOLAM 0.25 MG PO TABS
0.2500 mg | ORAL_TABLET | Freq: Three times a day (TID) | ORAL | Status: DC | PRN
  Administered 2018-02-06: 0.25 mg via ORAL
  Filled 2018-02-06: qty 1

## 2018-02-06 MED ORDER — DOXYCYCLINE HYCLATE 100 MG PO TABS
100.0000 mg | ORAL_TABLET | Freq: Two times a day (BID) | ORAL | Status: DC
  Administered 2018-02-06 – 2018-02-07 (×2): 100 mg via ORAL
  Filled 2018-02-06 (×2): qty 1

## 2018-02-06 MED ORDER — CIPROFLOXACIN HCL 500 MG PO TABS: 500.0000 mg | ORAL_TABLET | ORAL | Status: DC

## 2018-02-06 MED ORDER — SENNOSIDES-DOCUSATE SODIUM 8.6-50 MG PO TABS: 1.0000 | ORAL_TABLET | Freq: Every evening | ORAL | Status: DC | PRN

## 2018-02-06 MED ORDER — MORPHINE SULFATE (PF) 4 MG/ML IV SOLN
4.0000 mg | Freq: Once | INTRAVENOUS | Status: AC
  Administered 2018-02-06: 4 mg via INTRAVENOUS
  Filled 2018-02-06: qty 1

## 2018-02-06 MED ORDER — SODIUM CHLORIDE 0.9 % IV SOLN: Freq: Once | INTRAVENOUS | Status: DC

## 2018-02-06 MED ORDER — INSULIN ASPART 100 UNIT/ML ~~LOC~~ SOLN
10.0000 [IU] | Freq: Once | SUBCUTANEOUS | Status: AC
  Administered 2018-02-06: 10 [IU] via INTRAVENOUS
  Filled 2018-02-06: qty 1

## 2018-02-06 MED ORDER — ONDANSETRON HCL 4 MG/2ML IJ SOLN
4.0000 mg | INTRAMUSCULAR | Status: AC
  Administered 2018-02-06: 4 mg via INTRAVENOUS
  Filled 2018-02-06: qty 2

## 2018-02-06 MED ORDER — BISACODYL 5 MG PO TBEC: 5.0000 mg | DELAYED_RELEASE_TABLET | Freq: Every day | ORAL | Status: DC | PRN

## 2018-02-06 MED ORDER — LISINOPRIL 10 MG PO TABS
10.0000 mg | ORAL_TABLET | Freq: Every day | ORAL | Status: DC
  Administered 2018-02-06: 10 mg via ORAL
  Filled 2018-02-06: qty 1

## 2018-02-06 MED ORDER — METOCLOPRAMIDE HCL 5 MG/ML IJ SOLN
10.0000 mg | Freq: Once | INTRAMUSCULAR | Status: AC
  Administered 2018-02-06: 10 mg via INTRAVENOUS
  Filled 2018-02-06: qty 2

## 2018-02-06 MED ORDER — FLUOXETINE HCL 20 MG PO CAPS
20.0000 mg | ORAL_CAPSULE | Freq: Every day | ORAL | Status: DC
  Administered 2018-02-06: 20 mg via ORAL
  Filled 2018-02-06 (×2): qty 1

## 2018-02-06 MED ORDER — ONDANSETRON HCL 4 MG/2ML IJ SOLN
4.0000 mg | Freq: Four times a day (QID) | INTRAMUSCULAR | Status: DC | PRN
  Administered 2018-02-06: 4 mg via INTRAVENOUS
  Filled 2018-02-06: qty 2

## 2018-02-06 MED ORDER — ACETAMINOPHEN 325 MG PO TABS: 650.0000 mg | ORAL_TABLET | Freq: Four times a day (QID) | ORAL | Status: DC | PRN

## 2018-02-06 NOTE — ED Notes (Signed)
...  FIRST NURSE NOTE: Pt here from home sent via in home RN for wound check to the Left foot.

## 2018-02-06 NOTE — ED Provider Notes (Signed)
[facility] Emergency Department Provider Note  ____________________________________________   First MD Initiated Contact with Patient 02/06/18 1629     (approximate)  I have reviewed the triage vital signs and the nursing notes.   HISTORY  Chief Complaint Wound Check    HPI Shawn [name] is a 32 y.o. 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  He presents today from home for evaluation of the left foot wound.  Reportedly his home health nurse recommended he come to the ED for further evaluation.  He states it is been gradually getting worse over the last couple of weeks.  He was seen by Dr. Cleda Mccreedy about a week ago and has been seen by Dr. Oliva Bustard in the past as well.  The patient reports that the wound is getting worse and the pain in that wound as well as on the right foot which currently has a wound VAC is severe.  Weightbearing makes it worse and nothing particular makes it better although the oxycodone he had leftover from his surgery made the pain a little bit better.  He denies fever/chills, chest pain, shortness of breath.  He has had some nausea and has not been eating and drinking as much as usual lately.  Past Medical History:  Diagnosis Date  . Diabetes mellitus without complication ([place])   . GERD (gastroesophageal reflux disease)   . IBS (irritable bowel syndrome)     Patient Active Problem List   Diagnosis Date Noted  . Foot infection 02/06/2018  . Osteomyelitis ([place]) 01/10/2018  . Cellulitis 12/29/2017  . Sepsis ([place]) 12/18/2017  . Moderate recurrent major depression ([place]) 11/10/2017  . Type 2 diabetes mellitus with hyperosmolar nonketotic hyperglycemia ([place]) 11/09/2017  . History of migraine 07/15/2017  . IBS (irritable bowel syndrome) 07/15/2017  . Protein-calorie  malnutrition, severe 07/14/2017  . Abdominal pain 07/13/2017  . GERD (gastroesophageal reflux disease) 12/30/2009  . Diabetes ([place]) 11/25/2009  . MDD (major depressive disorder) 11/25/2009  . Hypercholesterolemia 03/07/2008    Past Surgical History:  Procedure Laterality Date  . ABDOMINAL AORTOGRAM W/LOWER EXTREMITY Right 12/23/2017   Procedure: ABDOMINAL AORTOGRAM W/LOWER EXTREMITY;  Surgeon: Katha Cabal, MD;  Location: [facility];  Service: Cardiovascular;  Laterality: Right;  . AMPUTATION Right 12/24/2017   Procedure: AMPUTATION RAY;  Surgeon: Sharlotte Alamo, DPM;  Location: [facility];  Service: Podiatry;  Laterality: Right;  . APPLICATION OF WOUND VAC Right 12/24/2017   Procedure: APPLICATION OF WOUND VAC;  Surgeon: Sharlotte Alamo, DPM;  Location: [facility];  Service: Podiatry;  Laterality: Right;  . APPLICATION OF WOUND VAC Right 01/01/2018   Procedure: APPLICATION OF WOUND VAC;  Surgeon: Albertine Patricia, DPM;  Location: [facility];  Service: Podiatry;  Laterality: Right;  . IRRIGATION AND DEBRIDEMENT FOOT Right 12/20/2017   Procedure: IRRIGATION AND DEBRIDEMENT FOOT;  Surgeon: Sharlotte Alamo, DPM;  Location: [facility];  Service: Podiatry;  Laterality: Right;  . IRRIGATION AND DEBRIDEMENT FOOT Right 12/24/2017   Procedure: IRRIGATION AND DEBRIDEMENT FOOT;  Surgeon: Sharlotte Alamo, DPM;  Location: [facility];  Service: Podiatry;  Laterality: Right;  . IRRIGATION AND DEBRIDEMENT FOOT Right 01/01/2018   Procedure: IRRIGATION AND DEBRIDEMENT FOOT-SKIN,SOFT TISSUE AND BONE;  Surgeon: Albertine Patricia, DPM;  Location: [facility];  Service: Podiatry;  Laterality: Right;    Prior to Admission  medications   Medication Sig Start Date End Date Taking? Authorizing Provider  ALPRAZolam (XANAX) 0.25 MG tablet Take 1 tablet (0.25 mg total) by mouth 3 (three) times daily as needed for anxiety. 12/27/17  Yes Loletha Grayer, MD  ferrous sulfate 325 (65 FE) MG tablet Take 1 tablet (325 mg total) by mouth 2  (two) times daily with a meal. 01/03/18  Yes Sainani, Belia Heman, MD  FLUoxetine (PROZAC) 20 MG capsule Take 1 capsule (20 mg total) by mouth daily. Patient taking differently: Take 20 mg by mouth at bedtime.  11/12/17  Yes Gladstone Lighter, MD  insulin glargine (LANTUS) 100 UNIT/ML injection Inject 35 Units into the skin 2 (two) times daily.    Yes [provider]  Insulin Syringes, Disposable, U-100 0.3 ML MISC 1 Syringe by Does not apply route 4 (four) times daily -  with meals and at bedtime. 12/27/17  Yes Wieting, Richard, MD  lisinopril (PRINIVIL,ZESTRIL) 10 MG tablet Take 1 tablet (10 mg total) by mouth daily. Patient taking differently: Take 10 mg by mouth at bedtime.  11/12/17  Yes Gladstone Lighter, MD  metoCLOPramide (REGLAN) 10 MG tablet Take 1 tablet (10 mg total) by mouth every 6 (six) hours as needed for nausea, vomiting or refractory nausea / vomiting. 01/11/18  Yes Demetrios Loll, MD  multivitamin (ONE-A-DAY MEN'S) TABS tablet Take 1 tablet by mouth at bedtime.    Yes [provider]  NOVOLIN R RELION 100 UNIT/ML injection Inject 0-10 Units into the skin 3 (three) times daily as needed for high blood sugar (sliding scale).  12/27/17  Yes [provider]  oxyCODONE-acetaminophen (PERCOCET/ROXICET) 5-325 MG tablet Take 1 tablet by mouth every 6 (six) hours as needed for moderate pain. Patient taking differently: Take 1 tablet by mouth every 4 (four) hours as needed for moderate pain.  01/03/18  Yes Henreitta Leber, MD  promethazine (PHENERGAN) 25 MG tablet Take 12.5 mg by mouth every 8 (eight) hours as needed for nausea or vomiting.    Yes [provider]  feeding supplement, GLUCERNA SHAKE, (GLUCERNA SHAKE) LIQD Take 237 mLs by mouth 3 (three) times daily between meals. 12/27/17   Loletha Grayer, MD  polyethylene glycol [facility]) packet Take 17 g by mouth daily as needed for moderate constipation. Patient not taking: Reported on 01/10/2018 12/26/17   Loletha Grayer, MD    Allergies Banana; Keflex [cephalexin]; Onion; Sulfa antibiotics; Grapeseed extract [nutritional supplements]; and Shellfish allergy  Family History  Problem Relation Age of Onset  . Diabetes Mother   . Ovarian cancer Mother     Social History Social History   Tobacco Use  . Smoking status: Never Smoker  . Smokeless tobacco: Never Used  Substance Use Topics  . Alcohol use: No    Frequency: Never  . Drug use: Yes    Types: Marijuana    Review of Systems Constitutional: No fever/chills.  General malaise and decreased oral intake Eyes: No visual changes. ENT: No sore throat. Cardiovascular: Denies chest pain. Respiratory: Denies shortness of breath. Gastrointestinal: No abdominal pain.  No nausea, no vomiting.  No diarrhea.  No constipation. Genitourinary: Negative for dysuria. Musculoskeletal: Chronic right foot pain status post partial amputation with wound VAC in place.  New gradually worsening pressure ulcer on the bottom of left foot Integumentary: Negative for rash.  Wounds on the feet as described above Neurological: Negative for headaches, focal weakness or numbness.   ____________________________________________   PHYSICAL EXAM:  VITAL SIGNS: ED Triage Vitals  Enc Vitals Group     BP 02/06/18 1428 124/77     Pulse Rate 02/06/18 1428 (!) 109     Resp 02/06/18 1428 18     Temp 02/06/18 1428 98.8 F (37.1 C)     Temp Source 02/06/18 1428 Oral     SpO2 02/06/18 1428 98 %     Weight 02/06/18 1430 56.7 kg (125 lb)     Height 02/06/18 1430 1.676 m (5\' 6" )     Head Circumference --      Peak Flow --      Pain Score 02/06/18 1441 10     Pain Loc --      Pain Edu? --      Excl. in [place]? --     Constitutional: Alert and oriented.  Disheveled but appears chronically ill in spite of his young age Eyes: Conjunctivae are normal.  Head: Atraumatic. Nose: No congestion/rhinnorhea. Mouth/Throat: Mucous membranes are moist. Neck: No stridor.  No  meningeal signs.   Cardiovascular: Borderline tachycardia, regular rhythm. Good peripheral circulation. Grossly normal heart sounds. Respiratory: Normal respiratory effort.  No retractions. Lungs CTAB. Gastrointestinal: Soft and nontender. No distention.  Musculoskeletal: No lower extremity tenderness nor edema. No gross deformities of extremities. Neurologic:  Normal speech and language. No gross focal neurologic deficits are appreciated.  Skin:  Skin is warm, dry and intact. No rash noted.  Some pallor.  Wound on the plantar surface of left foot near the fifth digit as documented in the photo below:      ____________________________________________   LABS (all labs ordered are listed, but only abnormal results are displayed)  Labs Reviewed  LACTIC ACID, PLASMA - Abnormal; Notable for the following components:      Result Value   Lactic Acid, Venous 2.1 (*)    All other components within normal limits  CBC WITH DIFFERENTIAL/PLATELET - Abnormal; Notable for the following components:   RBC 3.89 (*)    Hemoglobin 11.0 (*)    HCT 32.6 (*)    All other components within normal limits  GLUCOSE, CAPILLARY - Abnormal; Notable for the following components:   Glucose-Capillary 500 (*)    All other components within normal limits  COMPREHENSIVE METABOLIC PANEL - Abnormal; Notable for the following components:   Sodium 131 (*)    Potassium 5.3 (*)    Chloride 91 (*)    Glucose, Bld 529 (*)    BUN 24 (*)    Creatinine, Ser 1.58 (*)    Albumin 3.4 (*)    GFR calc non Af Amer 57 (*)    All other components within normal limits  CULTURE, BLOOD (ROUTINE X 2)  CULTURE, BLOOD (ROUTINE X 2)  LACTIC ACID, PLASMA  URINALYSIS, COMPLETE (UACMP) WITH MICROSCOPIC   ____________________________________________  EKG  No indication for EKG ____________________________________________  RADIOLOGY I, Hinda Kehr, personally viewed and evaluated these images (plain radiographs) as part of my  medical decision making, as well as reviewing the written report by the radiologist.  ED MD interpretation: No indication of osteomyelitis  Official radiology report(s): Dg Foot Complete Left  Result Date: 02/06/2018 CLINICAL DATA:  Painful soft tissue wound of the plantar aspect of the left foot. EXAM: LEFT FOOT - COMPLETE 3+ VIEW COMPARISON:  None. FINDINGS: There is soft tissue swelling on the lateral plantar aspect of the foot. There is a small cortical disruption of the medial aspect of the shaft of the proximal phalanx of the little toe which may represent a  subtle fracture. Minimal arthritic changes of the first MTP joint. Arterial vascular calcifications. IMPRESSION: Soft tissue swelling. No discrete osteomyelitis. Possible subtle fracture of the proximal phalanx of the little toe. Electronically Signed   By: Lorriane Shire M.D.   On: 02/06/2018 17:17    ____________________________________________   PROCEDURES  Critical Care performed: No   Procedure(s) performed:   Procedures   ____________________________________________   INITIAL IMPRESSION / ASSESSMENT AND PLAN / ED COURSE  As part of my medical decision making, I reviewed the following data within the [place] notes reviewed and incorporated, Labs reviewed , Old chart reviewed, Discussed with admitting physician , Case discussed by phone with podiatry and Notes from prior ED visits    Differential diagnosis includes, but is not limited to, sepsis from left foot wound, uncontrolled diabetes with hyperglycemia, DKA.  The patient's labs are notable for glucose of 529 and a BUN of 24 and creatinine 1.58 indicating a degree of acute kidney injury.  However his anion gap is normal.  He has no leukocytosis and his lab work is otherwise unremarkable except for a lactic acid of 2.1.  After 1 L of fluids his lactic acid came down to 1.6 and I suspect that the lactic acid was elevated due to volume status  rather than sepsis as he does not meet any criteria for sepsis.  I discussed the case by phone with Dr. Elvina Mattes with podiatry.  We talked about outpatient treatment with antibiotics such as Cipro and doxycycline as he has been on in the past with close outpatient follow-up versus hospital observation and in hospital consultation by podiatry.  The patient has followed up at times but we are both concerned (and Dr. Elvina Mattes knows the patient well) with his follow-up as well as his noncompliance with his insulin regimen and uncontrolled diabetes.  He would likely benefit from hospital observation, management of his diabetes, and empiric antibiotics until podiatry can see him.  I discussed this with Dr. Bridgett Larsson of the hospital service who will admit.  I have ordered Cipro 400 mg IV and doxycycline 100 mg by mouth, both of which should be continued twice daily at least until other recommendations can be provided by podiatry.  The patient is in agreement with the plan.  He also received morphine 4 mill grams IV and Zofran 4 mg IV in the ED in addition to a total of 2 L of normal saline by IV and 10 units of regular insulin IV.  Clinical Course as of Feb 06 1954  Mon Feb 06, 2018  1641 Lactic Acid, Venous(!!): 2.1 [CF]  1747 Lactic Acid, Venous: 1.6 [CF]  1849 discussed with Dr. Elvina Mattes.  Then discussed with Dr. 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, unspecified ulcer stage ([place])  Acute kidney injury ([place])  Elevated lactic acid level     MEDICATIONS GIVEN DURING THIS VISIT:  Medications  0.9 %  sodium chloride infusion (has no administration in time range)  ciprofloxacin (CIPRO) IVPB 400 mg (400 mg Intravenous  New Bag/Given 02/06/18 1931)  doxycycline (VIBRA-TABS) tablet 100 mg (100 mg Oral Given 02/06/18 1930)  ondansetron (ZOFRAN) tablet 4 mg ( Oral See Alternative 02/06/18 1930)    Or  ondansetron (  ZOFRAN) injection 4 mg (4 mg Intravenous Given 02/06/18 1930)  sodium chloride 0.9 % bolus 1,000 mL (0 mLs Intravenous Stopped 02/06/18 1617)  ondansetron (ZOFRAN) injection 4 mg (4 mg Intravenous Given 02/06/18 1456)  morphine 4 MG/ML injection 4 mg (4 mg Intravenous Given 02/06/18 1721)  ondansetron (ZOFRAN) injection 4 mg (4 mg Intravenous Given 02/06/18 1721)  sodium chloride 0.9 % bolus 1,000 mL (0 mLs Intravenous Stopped 02/06/18 1936)  insulin aspart (novoLOG) injection 10 Units (10 Units Intravenous Given 02/06/18 1931)     ED Discharge Orders    None       Note:  This document was prepared using Dragon voice recognition software and may include unintentional dictation errors.    Hinda Kehr, MD 02/06/18 [phone]

## 2018-02-06 NOTE — ED Notes (Addendum)
Patient transported to room 158 by this EDT.

## 2018-02-06 NOTE — H&P (Addendum)
[facility] [place] NAME: Shawn [name]    MR#:  [id number]  DATE OF BIRTH:  1985/11/10  DATE OF ADMISSION:  02/06/2018  PRIMARY CARE PHYSICIAN: Valera Castle, MD   REQUESTING/REFERRING PHYSICIAN: Dr. Karma Greaser  CHIEF COMPLAINT:   Chief Complaint  Patient presents with  . Wound Check  Left foot pain  HISTORY OF PRESENT ILLNESS:  Shawn [name]  is a 32 y.o. male with a known history of diabetes, IBS and GERD.  The patient presents the ED with above chief complaint.  He has wound VAC on the right foot.  But he started to have left foot pain and swelling for 2 days.  The patient also has hyperglycemia 529.  Podiatrist suggest placed for observation and possible procedure tomorrow. PAST MEDICAL HISTORY:   Past Medical History:  Diagnosis Date  . Diabetes mellitus without complication ([place])   . GERD (gastroesophageal reflux disease)   . IBS (irritable bowel syndrome)     PAST SURGICAL HISTORY:   Past Surgical History:  Procedure Laterality Date  . ABDOMINAL AORTOGRAM W/LOWER EXTREMITY Right 12/23/2017   Procedure: ABDOMINAL AORTOGRAM W/LOWER EXTREMITY;  Surgeon: Katha Cabal, MD;  Location: [facility];  Service: Cardiovascular;  Laterality: Right;  . AMPUTATION Right 12/24/2017   Procedure: AMPUTATION RAY;  Surgeon: Sharlotte Alamo, DPM;  Location: [facility];  Service: Podiatry;  Laterality: Right;  . APPLICATION OF WOUND VAC Right 12/24/2017   Procedure: APPLICATION OF WOUND VAC;  Surgeon: Sharlotte Alamo, DPM;  Location: [facility];  Service: Podiatry;  Laterality: Right;  . APPLICATION OF WOUND VAC Right 01/01/2018   Procedure: APPLICATION OF WOUND VAC;  Surgeon: Albertine Patricia, DPM;  Location: [facility];  Service: Podiatry;  Laterality: Right;  . IRRIGATION AND DEBRIDEMENT FOOT Right 12/20/2017   Procedure: IRRIGATION AND DEBRIDEMENT FOOT;  Surgeon: Sharlotte Alamo, DPM;  Location: [facility];  Service: Podiatry;  Laterality:  Right;  . IRRIGATION AND DEBRIDEMENT FOOT Right 12/24/2017   Procedure: IRRIGATION AND DEBRIDEMENT FOOT;  Surgeon: Sharlotte Alamo, DPM;  Location: [facility];  Service: Podiatry;  Laterality: Right;  . IRRIGATION AND DEBRIDEMENT FOOT Right 01/01/2018   Procedure: IRRIGATION AND DEBRIDEMENT FOOT-SKIN,SOFT TISSUE AND BONE;  Surgeon: Albertine Patricia, DPM;  Location: [facility];  Service: Podiatry;  Laterality: Right;    SOCIAL HISTORY:   Social History   Tobacco Use  . Smoking status: Never Smoker  . Smokeless tobacco: Never Used  Substance Use Topics  . Alcohol use: No    Frequency: Never    FAMILY HISTORY:   Family History  Problem Relation Age of Onset  . Diabetes Mother   . Ovarian cancer Mother     DRUG ALLERGIES:   Allergies  Allergen Reactions  . Banana Hives, Nausea And Vomiting and Rash  . Keflex [Cephalexin] Anaphylaxis  . Onion Hives, Nausea And Vomiting and Rash  . Sulfa Antibiotics Anaphylaxis  . Grapeseed Extract [Nutritional Supplements] Itching  . Shellfish Allergy Hives    "ALL SEAFOOD"    REVIEW OF SYSTEMS:   Review of Systems  Constitutional: Positive for chills and fever. Negative for malaise/fatigue.  HENT: Negative for sore throat.   Eyes: Negative for blurred vision and double vision.  Respiratory: Negative for cough, hemoptysis, shortness of breath, wheezing and stridor.   Cardiovascular: Negative for chest pain, palpitations, orthopnea and leg swelling.  Gastrointestinal: Negative for abdominal pain, blood in stool, diarrhea, melena, nausea and vomiting.  Genitourinary: Negative for dysuria,  flank pain and hematuria.  Musculoskeletal: Negative for back pain and joint pain.       Left foot pain.  Skin: Negative for rash.  Neurological: Negative for dizziness, sensory change, focal weakness, seizures, loss of consciousness, weakness and headaches.  Endo/Heme/Allergies: Negative for polydipsia.  Psychiatric/Behavioral: Negative for depression. The  patient is not nervous/anxious.     MEDICATIONS AT HOME:   Prior to Admission medications   Medication Sig Start Date End Date Taking? Authorizing Provider  ALPRAZolam (XANAX) 0.25 MG tablet Take 1 tablet (0.25 mg total) by mouth 3 (three) times daily as needed for anxiety. 12/27/17   Loletha Grayer, MD  feeding supplement, GLUCERNA SHAKE, (GLUCERNA SHAKE) LIQD Take 237 mLs by mouth 3 (three) times daily between meals. 12/27/17   Loletha Grayer, MD  ferrous sulfate 325 (65 FE) MG tablet Take 1 tablet (325 mg total) by mouth 2 (two) times daily with a meal. 01/03/18   Sainani, Belia Heman, MD  FLUoxetine (PROZAC) 20 MG capsule Take 1 capsule (20 mg total) by mouth daily. Patient taking differently: Take 20 mg by mouth at bedtime.  11/12/17   Gladstone Lighter, MD  insulin glargine (LANTUS) 100 UNIT/ML injection Inject 35 Units into the skin 2 (two) times daily.     [provider]  Insulin Syringes, Disposable, U-100 0.3 ML MISC 1 Syringe by Does not apply route 4 (four) times daily -  with meals and at bedtime. 12/27/17   Loletha Grayer, MD  lisinopril (PRINIVIL,ZESTRIL) 10 MG tablet Take 1 tablet (10 mg total) by mouth daily. Patient taking differently: Take 10 mg by mouth at bedtime.  11/12/17   Gladstone Lighter, MD  metoCLOPramide (REGLAN) 10 MG tablet Take 1 tablet (10 mg total) by mouth every 6 (six) hours as needed for nausea, vomiting or refractory nausea / vomiting. 01/11/18   Demetrios Loll, MD  multivitamin (ONE-A-DAY MEN'S) TABS tablet Take 1 tablet by mouth at bedtime.     [provider]  NOVOLIN R RELION 100 UNIT/ML injection Inject 0-10 Units into the skin 3 (three) times daily as needed for high blood sugar (sliding scale).  12/27/17   [provider]  oxyCODONE-acetaminophen (PERCOCET/ROXICET) 5-325 MG tablet Take 1 tablet by mouth every 6 (six) hours as needed for moderate pain. Patient taking differently: Take 1 tablet by mouth every 4 (four) hours as needed  for moderate pain.  01/03/18   Henreitta Leber, MD  polyethylene glycol [facility]) packet Take 17 g by mouth daily as needed for moderate constipation. Patient not taking: Reported on 01/10/2018 12/26/17   Loletha Grayer, MD  promethazine (PHENERGAN) 25 MG tablet Take 12.5 mg by mouth every 8 (eight) hours as needed for nausea or vomiting.     [provider]      VITAL SIGNS:  Blood pressure (!) 169/113, pulse 93, temperature 98.8 F (37.1 C), temperature source Oral, resp. rate 16, height 5\' 6"  (1.676 m), weight 125 lb (56.7 kg), SpO2 97 %.  PHYSICAL EXAMINATION:  Physical Exam  GENERAL:  32 y.o.-year-old patient lying in the bed with no acute distress.  EYES: Pupils equal, round, reactive to light and accommodation. No scleral icterus. Extraocular muscles intact.  HEENT: Head atraumatic, normocephalic. Oropharynx and nasopharynx clear.  NECK:  Supple, no jugular venous distention. No thyroid enlargement, no tenderness.  LUNGS: Normal breath sounds bilaterally, no wheezing, rales,rhonchi or crepitation. No use of accessory muscles of respiration.  CARDIOVASCULAR: S1, S2 normal. No murmurs, rubs, or gallops.  ABDOMEN:  Soft, nontender, nondistended. Bowel sounds present. No organomegaly or mass.  EXTREMITIES: No pedal edema, cyanosis, or clubbing.  Left ffot ulcer.  Right foot on wound VAC NEUROLOGIC: Cranial nerves II through XII are intact. Muscle strength 4/5 in all extremities. Sensation intact. Gait not checked.  PSYCHIATRIC: The patient is alert and oriented x 3.  SKIN: No obvious rash, lesion, or ulcer.   LABORATORY PANEL:   CBC Recent Labs  Lab 02/06/18 1447  WBC 9.2  HGB 11.0*  HCT 32.6*  PLT 265   ------------------------------------------------------------------------------------------------------------------  Chemistries  Recent Labs  Lab 02/06/18 1450  NA 131*  K 5.3*  CL 91*  CO2 30  GLUCOSE 529*  BUN 24*  CREATININE 1.58*  CALCIUM 8.9  AST 18    ALT 14  ALKPHOS 103  BILITOT 0.6   ------------------------------------------------------------------------------------------------------------------  Cardiac Enzymes No results for input(s): TROPONINI in the last 168 hours. ------------------------------------------------------------------------------------------------------------------  RADIOLOGY:  Dg Foot Complete Left  Result Date: 02/06/2018 CLINICAL DATA:  Painful soft tissue wound of the plantar aspect of the left foot. EXAM: LEFT FOOT - COMPLETE 3+ VIEW COMPARISON:  None. FINDINGS: There is soft tissue swelling on the lateral plantar aspect of the foot. There is a small cortical disruption of the medial aspect of the shaft of the proximal phalanx of the little toe which may represent a subtle fracture. Minimal arthritic changes of the first MTP joint. Arterial vascular calcifications. IMPRESSION: Soft tissue swelling. No discrete osteomyelitis. Possible subtle fracture of the proximal phalanx of the little toe. Electronically Signed   By: Lorriane Shire M.D.   On: 02/06/2018 17:17      IMPRESSION AND PLAN:   Left foot ulcer with infection. The patient will be placed for observation. Continue Cipro and doxycycline, follow-up podiatry for possible procedure tomorrow.  Hyperglycemia due to diabetes. Continue Lantus and start sliding scale.  Acute renal failure due to dehydration.  IV fluids support and follow-up BMP. Hyponatremia and hyperkalemia.  Normal saline IV and follow-up BMP.  All the records are reviewed and case discussed with ED provider. Management plans discussed with the patient, family and they are in agreement.  CODE STATUS: Full code  TOTAL TIME TAKING CARE OF THIS PATIENT: 38 minutes.    Demetrios Loll M.D on 02/06/2018 at 7:19 PM  Between 7am to 6pm - Pager - [phone]  After 6pm go to [email or web address] - Proofreader  [facility]  Office  [phone]  CC: Primary care  physician; Valera Castle, MD   Note: This dictation was prepared with Dragon dictation along with smaller phrase technology. Any transcriptional errors that result from this process are unin

## 2018-02-06 NOTE — ED Notes (Signed)
Date and time results received: 02/06/18 1608  Test: lactic acid Critical Value: 2.1  Name of Provider Notified: Dr. Karma Greaser  Orders Received? Or Actions Taken?: no new orders at this time

## 2018-02-06 NOTE — ED Notes (Signed)
Date and time results received: 02/06/18 5:04 PM  Test: glucose Critical Value: 529  Name of Provider Notified: Dr. Karma Greaser  Orders Received? Or Actions Taken?: no new orders at this time

## 2018-02-06 NOTE — Progress Notes (Signed)
Pt c/o of being nauseated and vomited X2 as soon as he got on the floor. Vital signs checked, BP 160/111. Dr. Bridgett Larsson paged and ordered for one time Reglan 10mg  IV. Will administer as ordered and continue to monitor.

## 2018-02-06 NOTE — ED Triage Notes (Signed)
Patient sent to ER by home health nurse for wound on bottom of left foot. Patient c/o nausea, diarrhea, and fever X2 days. 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 Patient is A&O x4. Patient took oxy for pain at home before coming in.

## 2018-02-07 ENCOUNTER — Ambulatory Visit: Payer: BLUE CROSS/BLUE SHIELD | Admitting: Dietician

## 2018-02-07 LAB — BASIC METABOLIC PANEL
Anion gap: 4 — ABNORMAL LOW (ref 5–15)
BUN: 23 mg/dL — ABNORMAL HIGH (ref 6–20)
CO2: 30 mmol/L (ref 22–32)
Calcium: 8.4 mg/dL — ABNORMAL LOW (ref 8.9–10.3)
Chloride: 105 mmol/L (ref 98–111)
Creatinine, Ser: 1.55 mg/dL — ABNORMAL HIGH (ref 0.61–1.24)
GFR calc non Af Amer: 59 mL/min — ABNORMAL LOW (ref >60)
Glucose, Bld: 212 mg/dL — ABNORMAL HIGH (ref 70–99)
Potassium: 4.4 mmol/L (ref 3.5–5.1)
SODIUM: 139 mmol/L (ref 135–145)

## 2018-02-07 LAB — CBC
HCT: 28.2 % — ABNORMAL LOW (ref 40.0–52.0)
HEMOGLOBIN: 9.7 g/dL — AB (ref 13.0–18.0)
MCH: 28.4 pg (ref 26.0–34.0)
MCHC: 34.3 g/dL (ref 32.0–36.0)
MCV: 82.7 fL (ref 80.0–100.0)
PLATELETS: 225 10*3/uL (ref 150–440)
RBC: 3.41 MIL/uL — AB (ref 4.40–5.90)
RDW: 13.6 % (ref 11.5–14.5)
WBC: 9.1 10*3/uL (ref 3.8–10.6)

## 2018-02-07 LAB — GLUCOSE, CAPILLARY
GLUCOSE-CAPILLARY: 196 mg/dL — AB (ref 70–99)
Glucose-Capillary: 216 mg/dL — ABNORMAL HIGH (ref 70–99)

## 2018-02-07 MED ORDER — DOXYCYCLINE HYCLATE 100 MG PO TABS: 100.0000 mg | ORAL_TABLET | Freq: Two times a day (BID) | ORAL | 0 refills | Status: AC

## 2018-02-07 MED ORDER — CIPROFLOXACIN HCL 500 MG PO TABS: 500.0000 mg | ORAL_TABLET | Freq: Two times a day (BID) | ORAL | 0 refills | Status: AC

## 2018-02-07 NOTE — Progress Notes (Signed)
Discharge instructions and med details reviewed with patient. All questions answered. Patient verbalized understanding. IV removed.  Printed prescriptions given to patient along with AVS.

## 2018-02-07 NOTE — Care Management Note (Signed)
Case Management Note  Patient Details  Name: RAYLIN WINER MRN: [id number] Date of Birth: Nov 12, 1985  Subjective/Objective:  Discharging today                  Action/Plan: Corene Cornea with [facility] notified of discharge.   Expected Discharge Date:  02/07/18               Expected Discharge Plan:  [place]  In-House Referral:     Discharge planning Services  CM Consult  Post Acute Care Choice:  Home Health, Resumption of Svcs/PTA Provider Choice offered to:  Patient  DME Arranged:    DME Agency:     HH Arranged:  PT, RN [place] Agency:  [facility]  Status of Service:  Completed, signed off  If discussed at [place] of Stay Meetings, dates discussed:    Additional Comments:  Jolly Mango, RN 02/07/2018, 9:05 AM

## 2018-02-07 NOTE — Progress Notes (Signed)
[facility] Podiat[facility]                                                      Patient Demographics  Shawn [name], is a 32 y.o. male   MRN: [id number]   DOB - 12-02-1985  Admit Date - 02/06/2018    Outpatient Primary MD for the patient is Olmedo, Guy Begin, MD  Consult requested in the Hospital by Bettey Costa, MD, On 02/07/2018    Reason for consult patient has a new development of diabetic wound on the plantar left foot.  He is about 2 months status post fourth fifth ray resections with significant open wound still lateral portion of the foot.  Is been using a wound VAC and is making gradual progress with granulation healing to that portion.  Was hospitalized yesterday with elevated blood sugars and was noted to have a wound on the plantar left foot.  Is a new development.   With History of -  Past Medical History:  Diagnosis Date  . Diabetes mellitus without complication ([place])   . GERD (gastroesophageal reflux disease)   . IBS (irritable bowel syndrome)       Past Surgical History:  Procedure Laterality Date  . ABDOMINAL AORTOGRAM W/LOWER EXTREMITY Right 12/23/2017   Procedure: ABDOMINAL AORTOGRAM W/LOWER EXTREMITY;  Surgeon: Katha Cabal, MD;  Location: [facility];  Service: Cardiovascular;  Laterality: Right;  . AMPUTATION Right 12/24/2017   Procedure: AMPUTATION RAY;  Surgeon: Sharlotte Alamo, DPM;  Location: [facility];  Service: Podiatry;  Laterality: Right;  . APPLICATION OF WOUND VAC Right 12/24/2017   Procedure: APPLICATION OF WOUND VAC;  Surgeon: Sharlotte Alamo, DPM;  Location: [facility];  Service: Podiatry;  Laterality: Right;  . APPLICATION OF WOUND VAC Right 01/01/2018   Procedure: APPLICATION OF WOUND VAC;  Surgeon: Albertine Patricia, DPM;  Location: [facility];  Service: Podiatry;  Laterality: Right;  . IRRIGATION AND DEBRIDEMENT FOOT Right  12/20/2017   Procedure: IRRIGATION AND DEBRIDEMENT FOOT;  Surgeon: Sharlotte Alamo, DPM;  Location: [facility];  Service: Podiatry;  Laterality: Right;  . IRRIGATION AND DEBRIDEMENT FOOT Right 12/24/2017   Procedure: IRRIGATION AND DEBRIDEMENT FOOT;  Surgeon: Sharlotte Alamo, DPM;  Location: [facility];  Service: Podiatry;  Laterality: Right;  . IRRIGATION AND DEBRIDEMENT FOOT Right 01/01/2018   Procedure: IRRIGATION AND DEBRIDEMENT FOOT-SKIN,SOFT TISSUE AND BONE;  Surgeon: Albertine Patricia, DPM;  Location: [facility];  Service: Podiatry;  Laterality: Right;    in for   Chief Complaint  Patient presents with  . Wound Check     HPI  Shawn [name]  is a 32 y.o. male, see above    Review of Systems: He is alert well oriented and pleasant this morning.  In addition to the HPI above,  No Fever-chills, No Headache, No changes with Vision or hearing, No problems swallowing food or Liquids, No Chest pain, Cough or Shortness of Breath, No Abdominal pain, No Nausea or Vommitting, Bowel movements are regular, No Blood in stool or Urine, No dysuria, No new skin rashes or bruises, No new joints pains-aches,  No new weakness, tingling, numbness in any extremity, No recent weight gain or loss, No polyuria, polydypsia or polyphagia, No significant Mental Stressors.  A full 10 point Review of Systems was done, except as stated above, all other Review of  Systems were negative.   Social History Social History   Tobacco Use  . Smoking status: Never Smoker  . Smokeless tobacco: Never Used  Substance Use Topics  . Alcohol use: No    Frequency: Never    Family History Family History  Problem Relation Age of Onset  . Diabetes Mother   . Ovarian cancer Mother     Prior to Admission medications   Medication Sig Start Date End Date Taking? Authorizing Provider  ALPRAZolam (XANAX) 0.25 MG tablet Take 1 tablet (0.25 mg total) by mouth 3 (three) times daily as needed for anxiety. 12/27/17  Yes Loletha Grayer, MD  ferrous sulfate 325 (65 FE) MG tablet Take 1 tablet (325 mg total) by mouth 2 (two) times daily with a meal. 01/03/18  Yes Sainani, Belia Heman, MD  FLUoxetine (PROZAC) 20 MG capsule Take 1 capsule (20 mg total) by mouth daily. Patient taking differently: Take 20 mg by mouth at bedtime.  11/12/17  Yes Gladstone Lighter, MD  insulin glargine (LANTUS) 100 UNIT/ML injection Inject 35 Units into the skin 2 (two) times daily.    Yes [provider]  Insulin Syringes, Disposable, U-100 0.3 ML MISC 1 Syringe by Does not apply route 4 (four) times daily -  with meals and at bedtime. 12/27/17  Yes Wieting, Richard, MD  lisinopril (PRINIVIL,ZESTRIL) 10 MG tablet Take 1 tablet (10 mg total) by mouth daily. Patient taking differently: Take 10 mg by mouth at bedtime.  11/12/17  Yes Gladstone Lighter, MD  metoCLOPramide (REGLAN) 10 MG tablet Take 1 tablet (10 mg total) by mouth every 6 (six) hours as needed for nausea, vomiting or refractory nausea / vomiting. 01/11/18  Yes Demetrios Loll, MD  multivitamin (ONE-A-DAY MEN'S) TABS tablet Take 1 tablet by mouth at bedtime.    Yes [provider]  NOVOLIN R RELION 100 UNIT/ML injection Inject 0-10 Units into the skin 3 (three) times daily as needed for high blood sugar (sliding scale).  12/27/17  Yes [provider]  oxyCODONE-acetaminophen (PERCOCET/ROXICET) 5-325 MG tablet Take 1 tablet by mouth every 6 (six) hours as needed for moderate pain. Patient taking differently: Take 1 tablet by mouth every 4 (four) hours as needed for moderate pain.  01/03/18  Yes Henreitta Leber, MD  promethazine (PHENERGAN) 25 MG tablet Take 12.5 mg by mouth every 8 (eight) hours as needed for nausea or vomiting.    Yes [provider]  feeding supplement, GLUCERNA SHAKE, (GLUCERNA SHAKE) LIQD Take 237 mLs by mouth 3 (three) times daily between meals. 12/27/17   Loletha Grayer, MD  polyethylene glycol [facility]) packet Take 17 g by mouth daily as needed  for moderate constipation. Patient not taking: Reported on 01/10/2018 12/26/17   Loletha Grayer, MD    Anti-infectives (From admission, onward)   Start     Dose/Rate Route Frequency Ordered Stop   02/06/18 1900  ciprofloxacin (CIPRO) IVPB 400 mg     400 mg 200 mL/hr over 60 Minutes Intravenous Every 12 hours 02/06/18 1849     02/06/18 1900  doxycycline (VIBRA-TABS) tablet 100 mg     100 mg Oral Every 12 hours 02/06/18 1849     02/06/18 1845  ciprofloxacin (CIPRO) tablet 500 mg  Status:  Discontinued     500 mg Oral STAT 02/06/18 1831 02/06/18 1847   02/06/18 1845  doxycycline (VIBRA-TABS) tablet 100 mg  Status:  Discontinued     100 mg Oral  Once 02/06/18 1831 02/06/18 1847  Scheduled Meds: . doxycycline  100 mg Oral Q12H  . ferrous sulfate  325 mg Oral BID WC  . FLUoxetine  20 mg Oral QHS  . heparin  5,000 Units Subcutaneous Q8H  . insulin aspart  0-5 Units Subcutaneous QHS  . insulin aspart  0-9 Units Subcutaneous TID WC  . insulin glargine  35 Units Subcutaneous BID  . lisinopril  10 mg Oral QHS   Continuous Infusions: . sodium chloride    . sodium chloride 75 mL/hr (02/06/18 2042)  . ciprofloxacin 400 mg (02/07/18 0610)   PRN Meds:.acetaminophen **OR** acetaminophen, albuterol, ALPRAZolam, bisacodyl, HYDROcodone-acetaminophen, ketorolac, ondansetron **OR** ondansetron (ZOFRAN) IV, senna-docusate  Allergies  Allergen Reactions  . Banana Hives, Nausea And Vomiting and Rash  . Keflex [Cephalexin] Anaphylaxis  . Onion Hives, Nausea And Vomiting and Rash  . Sulfa Antibiotics Anaphylaxis  . Grapeseed Extract [Nutritional Supplements] Itching  . Shellfish Allergy Hives    "ALL SEAFOOD"    Physical Exam  Vitals  Blood pressure (!) 137/95, pulse 93, temperature 98.4 F (36.9 C), temperature source Oral, resp. rate 16, height '5\' 6"'$  (1.676 m), weight 56.7 kg (125 lb), SpO2 99 %.  Lower Extremity exam:  Vascular: Palpable bilateral  Dermatological: Sub-met 5 wound  on the left foot appears to be mostly superficial is approximately 2 cm diameter.  There is no redness or purulent drainage around the region at this timeframe.  Uncertain as to the full depth of the wound but likely involves at least the skin structures.  Neurological: Chronic peripheral neuropathy secondary to uncontrolled diabetes  Ortho: Cavovarus foot type left.  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  This was changed yesterday.  Data Review  CBC Recent Labs  Lab 02/06/18 1447 02/07/18 0445  WBC 9.2 9.1  HGB 11.0* 9.7*  HCT 32.6* 28.2*  PLT 265 225  MCV 83.9 82.7  MCH 28.3 28.4  MCHC 33.8 34.3  RDW 14.3 13.6  LYMPHSABS 1.5  --   MONOABS 0.9  --   EOSABS 0.2  --   BASOSABS 0.0  --    ------------------------------------------------------------------------------------------------------------------  Chemistries  Recent Labs  Lab 02/06/18 1450 02/07/18 0445  NA 131* 139  K 5.3* 4.4  CL 91* 105  CO2 30 30  GLUCOSE 529* 212*  BUN 24* 23*  CREATININE 1.58* 1.55*  CALCIUM 8.9 8.4*  AST 18  --   ALT 14  --   ALKPHOS 103  --   BILITOT 0.6  --    ------------------------------------------------------------------------------------------------------------------ estimated creatinine clearance is 55.9 mL/min (A) (by C-G formula based on SCr of 1.55 mg/dL (H)). ------------------------------------------------------------------------------------------------------------------ No results for input(s): TSH, T4TOTAL, T3FREE, THYROIDAB in the last 72 hours.  Invalid input(s): FREET3 Urinalysis    Component Value Date/Time   COLORURINE YELLOW (A) 01/10/2018 1646   APPEARANCEUR CLEAR (A) 01/10/2018 1646   APPEARANCEUR Clear 10/06/2014 0758   LABSPEC 1.035 (H) 01/10/2018 1646   LABSPEC 1.031 10/06/2014 0758   PHURINE 5.0 01/10/2018 1646   GLUCOSEU >=500 (A) 01/10/2018 1646   GLUCOSEU >=500 10/06/2014 0758   HGBUR NEGATIVE 01/10/2018  1646   BILIRUBINUR NEGATIVE 01/10/2018 1646   BILIRUBINUR Negative 10/06/2014 0758   KETONESUR NEGATIVE 01/10/2018 1646   PROTEINUR 100 (A) 01/10/2018 1646   UROBILINOGEN 0.2 03/29/2007 1506   NITRITE NEGATIVE 01/10/2018 1646   LEUKOCYTESUR NEGATIVE 01/10/2018 1646   LEUKOCYTESUR Negative 10/06/2014 0758     Imaging results:   Dg Foot Complete Left  Result Date: 02/06/2018 CLINICAL DATA:  Painful soft tissue wound of the plantar aspect of the left foot. EXAM: LEFT FOOT - COMPLETE 3+ VIEW COMPARISON:  None. FINDINGS: There is soft tissue swelling on the lateral plantar aspect of the foot. There is a small cortical disruption of the medial aspect of the shaft of the proximal phalanx of the little toe which may represent a subtle fracture. Minimal arthritic changes of the first MTP joint. Arterial vascular calcifications. IMPRESSION: Soft tissue swelling. No discrete osteomyelitis. Possible subtle fracture of the proximal phalanx of the little toe. Electronically Signed   By: Lorriane Shire M.D.   On: 02/06/2018 17:17    Assessment & Plan: I think the left foot wound can be managed on outpatient basis does not need debridement at this point just needs offloading and appropriate dressings.  Ordered a heavy gauze wet-to-dry dressing with Kerlix pads and Kerlix wrap to the left foot and also a postop shoe for utilization.  His wound VAC can be left on change tomorrow with his regular schedule.  If he gets discharged today I can follow in the office next week but he needs to keep the left foot dressed with a heavy padding and use the postop shoe that ordered in order to offload the foot appropriately and avoid worsening to the ulceration and aid promotion of healing.  Active Problems:   Foot infection   Family Communication: Plan discussed with patient   Albertine Patricia M.D on 02/07/2018 at 8:09 AM  Thank you for the consult, we will follow the patient with you in the Hospital.

## 2018-02-07 NOTE — Progress Notes (Addendum)
Inpatient Diabetes Program Recommendations  AACE/ADA: New Consensus Statement on Inpatient Glycemic Control (2019)  Target Ranges:  Prepandial:   less than 140 mg/dL      Peak postprandial:   less than 180 mg/dL (1-2 hours)      Critically ill patients:  140 - 180 mg/dL   Results for Shawn [name], Shawn [name] (MRN [id number]) as of 02/07/2018 08:43  Ref. Range 02/06/2018 14:43 02/06/2018 21:36 02/07/2018 08:09  Glucose-Capillary Latest Ref Range: 70 - 99 mg/dL 500 (H) 214 (H) 216 (H)   Review of Glycemic Control  Diabetes history: DM1 (makes no insulin; requires basal, correction, and meal coverage insulin) Outpatient Diabetes medications: Lantus 35 units BID, Regular 0-10 units TID with meals Current orders for Inpatient glycemic control: Lantus 35 units BID, Novolog 0-9 units TID with meals, Novolog 0-5 units QHS  Inpatient Diabetes Program Recommendations:  Insulin - Basal: Please consider decreasing Lantus to 25 units QHS. Insulin - Meal Coverage: Please consider ordering Novolog 3 units TID with meals for meal coverage if patient eats at least 50% of meals. HgbA1C: A1C 16.9% on 12/19/17. 5th hospital admission in past 6 months. Inpatient Diabetes Coordinator spoke with patient on 12/19/17 regarding DM control during prior hospitalization.  Addendum 02/07/18@11 :40-Talked with patient regarding DM control. Patient states that he currently takes Lantus and Regular insulin for DM control. Patient reports that due to his foot wound, he has been out of work and he will likely lose his job and insurance soon. Discussed DM control and stressed importance of good DM control and managment. Explained importance of DM control to help with wound healing and to decrease further risk of developing complications from uncontrolled DM. Discussed [facility] and [facility]. Encouraged patient to try to go ahead and make appointment at [facility] and get established there for follow up and Medication  Management for medication assistance if needed in the future. Patient verbalized understanding and reports that he has no questions or other DM concerns at this time. Patient being discharged home today.  Thanks, [name], RN, MSN, CDE Diabetes Coordinator Inpatient Diabetes Program [phone] (Team Pager from 8am to 5pm)

## 2018-02-07 NOTE — Discharge Summary (Signed)
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 Diabetes mellitus without complication ([place])   . GERD (gastroesophageal reflux disease)   . IBS (irritable bowel syndrome)     HOSPITAL COURSE:   32 year old male with history diabetes and fourth/fifth ray resection with significant open wound on the right foot with wound VAC placed who presented to the hospital with a diabetic wound on the plantar left foot.   1.  Left foot wound: Patient was eval by podiatry.  This will be managed as outpatient basis.  He does not need debridement at this point.  He needs offloading and appropriate dressing changes.  He will continue heavy gauze wet-to-dry dressing with Kerlix pads and Kerlix wrap to the left foot.  He also has a postop shoe for utilization.  He will continue with wound VAC on the right foot which will be changed tomorrow per his regular schedule.  He will continue on ciprofloxacin and doxycycline.  He will follow-up with podiatry within the next week.  2.  Diabetes: Patient will continue his outpatient regimen  3.  Anemia of chronic disease: Continue ferrous sulfate  4.  Depression anxiety: Continue Prozac and Xanax  DISCHARGE CONDITIONS AND DIET:   Stable for discharge on diabetic diet  CONSULTS OBTAINED:  Treatment  Team:  Albertine Patricia, DPM  DRUG ALLERGIES:   Allergies  Allergen Reactions  . Banana Hives, Nausea And Vomiting and Rash  . Keflex [Cephalexin] Anaphylaxis  . Onion Hives, Nausea And Vomiting and Rash  . Sulfa Antibiotics Anaphylaxis  . Grapeseed Extract [Nutritional Supplements] Itching  . Shellfish Allergy Hives    "ALL SEAFOOD"    DISCHARGE MEDICATIONS:   Allergies as of 02/07/2018      Reactions   Banana Hives, Nausea And Vomiting, Rash   Keflex [cephalexin] Anaphylaxis   Onion Hives, Nausea And Vomiting, Rash   Sulfa Antibiotics Anaphylaxis   Grapeseed Extract [nutritional Supplements] Itching   Shellfish Allergy Hives   "ALL SEAFOOD"      Medication List    STOP taking these medications   polyethylene glycol packet Commonly known as:  MIRALAX     TAKE these medications   ALPRAZolam 0.25 MG tablet Commonly known as:  XANAX Take 1 tablet (0.25 mg total) by mouth 3 (three) times daily as needed for anxiety.   ciprofloxacin 500 MG tablet Commonly known as:  CIPRO Take 1 tablet (500 mg total) by mouth 2 (two) times daily for 14 days.   doxycycline 100 MG tablet Commonly known as:  VIBRA-TABS Take 1 tablet (100 mg total) by mouth every 12 (twelve) hours for 14 days.   feeding supplement (GLUCERNA SHAKE) Liqd Take 237 mLs by mouth 3 (three) times daily between meals.   ferrous sulfate 325 (65 FE) MG tablet Take 1 tablet (325 mg total) by mouth 2 (two) times daily with a meal.  FLUoxetine 20 MG capsule Commonly known as:  PROZAC Take 1 capsule (20 mg total) by mouth daily. What changed:  when to take this   insulin glargine 100 UNIT/ML injection Commonly known as:  LANTUS Inject 35 Units into the skin 2 (two) times daily.   Insulin Syringes (Disposable) U-100 0.3 ML Misc 1 Syringe by Does not apply route 4 (four) times daily -  with meals and at bedtime.   lisinopril 10 MG tablet Commonly known as:  PRINIVIL,ZESTRIL Take 1 tablet (10 mg total) by  mouth daily. What changed:  when to take this   metoCLOPramide 10 MG tablet Commonly known as:  REGLAN Take 1 tablet (10 mg total) by mouth every 6 (six) hours as needed for nausea, vomiting or refractory nausea / vomiting.   multivitamin Tabs tablet Take 1 tablet by mouth at bedtime.   NOVOLIN R RELION 100 units/mL injection Generic drug:  insulin regular Inject 0-10 Units into the skin 3 (three) times daily as needed for high blood sugar (sliding scale).   oxyCODONE-acetaminophen 5-325 MG tablet Commonly known as:  PERCOCET/ROXICET Take 1 tablet by mouth every 6 (six) hours as needed for moderate pain. What changed:  when to take this   promethazine 25 MG tablet Commonly known as:  PHENERGAN Take 12.5 mg by mouth every 8 (eight) hours as needed for nausea or vomiting.            Discharge Care Instructions  (From admission, onward)        Start     Ordered   02/07/18 0000  Discharge wound care:    Comments:  Heavy gauze dressing to left foot dressing wet-to-dry daily continue wound VAC on right foot   02/07/18 0855        Today   CHIEF COMPLAINT:   Patient doing well no acute issues.  Seen by podiatry this morning.   VITAL SIGNS:  Blood pressure (!) 137/95, pulse 93, temperature 98.4 F (36.9 C), temperature source Oral, resp. rate 16, height 5\' 6"  (1.676 m), weight 125 lb (56.7 kg), SpO2 99 %.   REVIEW OF SYSTEMS:  Review of Systems  Constitutional: Negative.  Negative for chills, fever and malaise/fatigue.  HENT: Negative.  Negative for ear discharge, ear pain, hearing loss, nosebleeds and sore throat.   Eyes: Negative.  Negative for blurred vision and pain.  Respiratory: Negative.  Negative for cough, hemoptysis, shortness of breath and wheezing.   Cardiovascular: Negative.  Negative for chest pain, palpitations and leg swelling.  Gastrointestinal: Negative.  Negative for abdominal pain, blood in stool, diarrhea, nausea and vomiting.  Genitourinary:  Negative.  Negative for dysuria.  Musculoskeletal: Negative.  Negative for back pain.  Skin: Negative.        Wound vac right  Left foot small ulcer  Neurological: Negative for dizziness, tremors, speech change, focal weakness, seizures and headaches.  Endo/Heme/Allergies: Negative.  Does not bruise/bleed easily.  Psychiatric/Behavioral: Negative.  Negative for depression, hallucinations and suicidal ideas.     PHYSICAL EXAMINATION:  GENERAL:  32 y.o.-year-old patient lying in the bed with no acute distress.  NECK:  Supple, no jugular venous distention. No thyroid enlargement, no tenderness.  LUNGS: Normal breath sounds bilaterally, no wheezing, rales,rhonchi  No use of accessory muscles of respiration.  CARDIOVASCULAR: S1, S2 normal. No murmurs, rubs, or gallops.  ABDOMEN: Soft, non-tender, non-distended. Bowel sounds present. No organomegaly or mass.  EXTREMITIES: No pedal edema, cyanosis, or clubbing.  PSYCHIATRIC: The patient is alert and  oriented x 3.  SKIN: Left foot is wrapped in Kerlix and right foot with wound VAC  DATA REVIEW:   CBC Recent Labs  Lab 02/07/18 0445  WBC 9.1  HGB 9.7*  HCT 28.2*  PLT 225    Chemistries  Recent Labs  Lab 02/06/18 1450 02/07/18 0445  NA 131* 139  K 5.3* 4.4  CL 91* 105  CO2 30 30  GLUCOSE 529* 212*  BUN 24* 23*  CREATININE 1.58* 1.55*  CALCIUM 8.9 8.4*  AST 18  --   ALT 14  --   ALKPHOS 103  --   BILITOT 0.6  --     Cardiac Enzymes No results for input(s): TROPONINI in the last 168 hours.  Microbiology Results  @MICRORSLT48 @  RADIOLOGY:  Dg Foot Complete Left  Result Date: 02/06/2018 CLINICAL DATA:  Painful soft tissue wound of the plantar aspect of the left foot. EXAM: LEFT FOOT - COMPLETE 3+ VIEW COMPARISON:  None. FINDINGS: There is soft tissue swelling on the lateral plantar aspect of the foot. There is a small cortical disruption of the medial aspect of the shaft of the proximal phalanx of the little toe which may  represent a subtle fracture. Minimal arthritic changes of the first MTP joint. Arterial vascular calcifications. IMPRESSION: Soft tissue swelling. No discrete osteomyelitis. Possible subtle fracture of the proximal phalanx of the little toe. Electronically Signed   By: Lorriane Shire M.D.   On: 02/06/2018 17:17      Allergies as of 02/07/2018      Reactions   Banana Hives, Nausea And Vomiting, Rash   Keflex [cephalexin] Anaphylaxis   Onion Hives, Nausea And Vomiting, Rash   Sulfa Antibiotics Anaphylaxis   Grapeseed Extract [nutritional Supplements] Itching   Shellfish Allergy Hives   "ALL SEAFOOD"      Medication List    STOP taking these medications   polyethylene glycol packet Commonly known as:  MIRALAX     TAKE these medications   ALPRAZolam 0.25 MG tablet Commonly known as:  XANAX Take 1 tablet (0.25 mg total) by mouth 3 (three) times daily as needed for anxiety.   ciprofloxacin 500 MG tablet Commonly known as:  CIPRO Take 1 tablet (500 mg total) by mouth 2 (two) times daily for 14 days.   doxycycline 100 MG tablet Commonly known as:  VIBRA-TABS Take 1 tablet (100 mg total) by mouth every 12 (twelve) hours for 14 days.   feeding supplement (GLUCERNA SHAKE) Liqd Take 237 mLs by mouth 3 (three) times daily between meals.   ferrous sulfate 325 (65 FE) MG tablet Take 1 tablet (325 mg total) by mouth 2 (two) times daily with a meal.   FLUoxetine 20 MG capsule Commonly known as:  PROZAC Take 1 capsule (20 mg total) by mouth daily. What changed:  when to take this   insulin glargine 100 UNIT/ML injection Commonly known as:  LANTUS Inject 35 Units into the skin 2 (two) times daily.   Insulin Syringes (Disposable) U-100 0.3 ML Misc 1 Syringe by Does not apply route 4 (four) times daily -  with meals and at bedtime.   lisinopril 10 MG tablet Commonly known as:  PRINIVIL,ZESTRIL Take 1 tablet (10 mg total) by mouth daily. What changed:  when to take this    metoCLOPramide 10 MG tablet Commonly known as:  REGLAN Take 1 tablet (10 mg total) by mouth every 6 (six) hours as needed for nausea, vomiting or refractory nausea / vomiting.   multivitamin  Tabs tablet Take 1 tablet by mouth at bedtime.   NOVOLIN R RELION 100 units/mL injection Generic drug:  insulin regular Inject 0-10 Units into the skin 3 (three) times daily as needed for high blood sugar (sliding scale).   oxyCODONE-acetaminophen 5-325 MG tablet Commonly known as:  PERCOCET/ROXICET Take 1 tablet by mouth every 6 (six) hours as needed for moderate pain. What changed:  when to take this   promethazine 25 MG tablet Commonly known as:  PHENERGAN Take 12.5 mg by mouth every 8 (eight) hours as needed for nausea or vomiting.            Discharge Care Instructions  (From admission, onward)        Start     Ordered   02/07/18 0000  Discharge wound care:    Comments:  Heavy gauze dressing to left foot dressing wet-to-dry daily continue wound VAC on right foot   02/07/18 0855        Management plans discussed with the patient and he is in agreement. Stable for discharge home with [facility]  Patient should follow up with dr troxler  CODE STATUS:     Code Status Orders  (From admission, onward)        Start     Ordered   02/06/18 2023  Full code  Continuous     02/06/18 2022    Code Status History    Date Active Date Inactive Code Status Order ID Comments User Context   01/10/2018 2259 01/11/2018 1947 Full Code [id number]  Amelia Jo, MD Inpatient   12/30/2017 0019 01/03/2018 1457 Full Code [id number]  Harrie Foreman, MD Inpatient   12/20/2017 1541 12/27/2017 2147 Full Code [id number]  Sharlotte Alamo, DPM Inpatient   11/10/2017 0049 11/11/2017 1632 Full Code [id number]  Amelia Jo, MD Inpatient   07/13/2017 2112 07/15/2017 1730 Full Code [id number]  Jules Husbands, MD ED      TOTAL TIME TAKING CARE OF THIS PATIENT: 38 minutes.    Note: This dictation was prepared with  Dragon dictation along with smaller phrase technology. Any transcriptional errors that result from this process are unintentional.  Jabbar Palmero M.D on 02/07/2018 at 8:58 AM  Between 7am to 6pm - Pager - [phone] After 6pm go to [email or web address] - password [email or web address] [facility]  Office  [phone]  CC: Primary care physician; Valera Castle, MD

## 2018-02-11 LAB — CULTURE, BLOOD (ROUTINE X 2)
Culture: NO GROWTH
Culture: NO GROWTH

## 2018-02-21 ENCOUNTER — Encounter: Payer: Self-pay | Admitting: Emergency Medicine

## 2018-02-21 ENCOUNTER — Emergency Department
Admission: EM | Admit: 2018-02-21 | Discharge: 2018-02-21 | Disposition: A | Discharge status: Home / Self Care | Payer: Medicaid Other | Attending: Emergency Medicine | Admitting: Emergency Medicine

## 2018-02-21 ENCOUNTER — Other Ambulatory Visit: Payer: Self-pay

## 2018-02-21 DIAGNOSIS — Z79899 Other long term (current) drug therapy: Secondary | ICD-10-CM | POA: Insufficient documentation

## 2018-02-21 DIAGNOSIS — R109 Unspecified abdominal pain: Secondary | ICD-10-CM | POA: Diagnosis not present

## 2018-02-21 DIAGNOSIS — R197 Diarrhea, unspecified: Secondary | ICD-10-CM | POA: Diagnosis not present

## 2018-02-21 DIAGNOSIS — F1210 Cannabis abuse, uncomplicated: Secondary | ICD-10-CM | POA: Insufficient documentation

## 2018-02-21 DIAGNOSIS — R739 Hyperglycemia, unspecified: Secondary | ICD-10-CM | POA: Insufficient documentation

## 2018-02-21 DIAGNOSIS — R1110 Vomiting, unspecified: Secondary | ICD-10-CM | POA: Diagnosis not present

## 2018-02-21 DIAGNOSIS — E119 Type 2 diabetes mellitus without complications: Secondary | ICD-10-CM | POA: Diagnosis not present

## 2018-02-21 DIAGNOSIS — F1610 Hallucinogen abuse, uncomplicated: Secondary | ICD-10-CM | POA: Diagnosis not present

## 2018-02-21 DIAGNOSIS — Z794 Long term (current) use of insulin: Secondary | ICD-10-CM | POA: Insufficient documentation

## 2018-02-21 LAB — URINALYSIS, COMPLETE (UACMP) WITH MICROSCOPIC
BACTERIA UA: NONE SEEN
Bilirubin Urine: NEGATIVE
KETONES UR: 5 mg/dL — AB
LEUKOCYTES UA: NEGATIVE
Nitrite: NEGATIVE
PH: 5 (ref 5.0–8.0)
PROTEIN: 100 mg/dL — AB
Specific Gravity, Urine: 1.021 (ref 1.005–1.030)
Squamous Epithelial / LPF: NONE SEEN (ref 0–5)

## 2018-02-21 LAB — COMPREHENSIVE METABOLIC PANEL
ALT: 13 U/L (ref 0–44)
AST: 15 U/L (ref 15–41)
Albumin: 3.6 g/dL (ref 3.5–5.0)
Alkaline Phosphatase: 98 U/L (ref 38–126)
Anion gap: 10 (ref 5–15)
BUN: 19 mg/dL (ref 6–20)
CO2: 26 mmol/L (ref 22–32)
Calcium: 9.4 mg/dL (ref 8.9–10.3)
Chloride: 97 mmol/L — ABNORMAL LOW (ref 98–111)
Creatinine, Ser: 1.42 mg/dL — ABNORMAL HIGH (ref 0.61–1.24)
GFR calc Af Amer: >60 mL/min (ref >60)
Glucose, Bld: 447 mg/dL — ABNORMAL HIGH (ref 70–99)
POTASSIUM: 4.6 mmol/L (ref 3.5–5.1)
Sodium: 133 mmol/L — ABNORMAL LOW (ref 135–145)
TOTAL PROTEIN: 7.6 g/dL (ref 6.5–8.1)
Total Bilirubin: 0.5 mg/dL (ref 0.3–1.2)

## 2018-02-21 LAB — URINE DRUG SCREEN, QUALITATIVE (ARMC ONLY)
Amphetamines, Ur Screen: NOT DETECTED
Barbiturates, Ur Screen: NOT DETECTED
CANNABINOID 50 NG, UR ~~LOC~~: POSITIVE — AB
Cocaine Metabolite,Ur ~~LOC~~: NOT DETECTED
MDMA (Ecstasy)Ur Screen: NOT DETECTED
Methadone Scn, Ur: NOT DETECTED
OPIATE, UR SCREEN: NOT DETECTED
Phencyclidine (PCP) Ur S: NOT DETECTED
Tricyclic, Ur Screen: NOT DETECTED

## 2018-02-21 LAB — CBC
HEMATOCRIT: 36.8 % — AB (ref 40.0–52.0)
Hemoglobin: 12.9 g/dL — ABNORMAL LOW (ref 13.0–18.0)
MCH: 28.5 pg (ref 26.0–34.0)
MCHC: 34.9 g/dL (ref 32.0–36.0)
MCV: 81.7 fL (ref 80.0–100.0)
Platelets: 366 10*3/uL (ref 150–440)
RBC: 4.5 MIL/uL (ref 4.40–5.90)
RDW: 13.9 % (ref 11.5–14.5)
WBC: 8.5 10*3/uL (ref 3.8–10.6)

## 2018-02-21 LAB — LIPASE, BLOOD: LIPASE: 28 U/L (ref 11–51)

## 2018-02-21 LAB — GLUCOSE, CAPILLARY
Glucose-Capillary: 432 mg/dL — ABNORMAL HIGH (ref 70–99)
Glucose-Capillary: 259 mg/dL — ABNORMAL HIGH (ref 70–99)

## 2018-02-21 LAB — LACTIC ACID, PLASMA: Lactic Acid, Venous: 1.4 mmol/L (ref 0.5–1.9)

## 2018-02-21 MED ORDER — INSULIN ASPART 100 UNIT/ML ~~LOC~~ SOLN
10.0000 [IU] | Freq: Once | SUBCUTANEOUS | Status: AC
  Administered 2018-02-21: 10 [IU] via INTRAVENOUS
  Filled 2018-02-21: qty 1

## 2018-02-21 MED ORDER — SODIUM CHLORIDE 0.9 % IV BOLUS
1000.0000 mL | Freq: Once | INTRAVENOUS | Status: AC
  Administered 2018-02-21: 1000 mL via INTRAVENOUS

## 2018-02-21 MED ORDER — PROMETHAZINE HCL 25 MG/ML IJ SOLN
12.5000 mg | Freq: Once | INTRAMUSCULAR | Status: AC
  Administered 2018-02-21: 12.5 mg via INTRAVENOUS
  Filled 2018-02-21: qty 1

## 2018-02-21 MED ORDER — ONDANSETRON HCL 4 MG/2ML IJ SOLN
4.0000 mg | Freq: Once | INTRAMUSCULAR | Status: AC | PRN
  Administered 2018-02-21: 4 mg via INTRAVENOUS
  Filled 2018-02-21: qty 2

## 2018-02-21 NOTE — ED Notes (Signed)
Pt nauseated after eating food. MD aware

## 2018-02-21 NOTE — ED Notes (Addendum)
McShane MD at bedside with update

## 2018-02-21 NOTE — ED Provider Notes (Addendum)
[facility] Emergency Department Provider Note  ____________________________________________   I have reviewed the triage vital signs and the nursing notes. Where available I have reviewed prior notes and, if possible and indicated, outside hospital notes.    HISTORY  Chief Complaint Hyperglycemia and Emesis    HPI Shawn [name] is a 32 y.o. 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  Patient does have a history of DKA.  States he felt a little unwell yesterday and vague terms without any specific focality and then woke up this morning started vomiting.  Took his insulin yesterday and he states he took it this morning.  He is on a sliding scale he supposed to take it 3 times a day however he took 10 units of regular this morning and did not take it since.  He has had copious diarrhea, 3 times an hour since this morning.  No recent travel.  Positive recent antibiotics.  He has diffuse abdominal discomfort associate with his vomiting.  He has been able to drink some sodas and some of the liquids he states today.  No fever no chills no chest pain or shortness of breath no headache no stiff neck no rash no recent tick bite    Past Medical History:  Diagnosis Date  . Diabetes mellitus without complication ([place])   . GERD (gastroesophageal reflux disease)   . IBS (irritable bowel syndrome)     Patient Active Problem List   Diagnosis Date Noted  . Foot infection 02/06/2018  . Osteomyelitis ([place]) 01/10/2018  . Cellulitis 12/29/2017  . Sepsis ([place]) 12/18/2017  . Moderate recurrent major depression ([place]) 11/10/2017  . Type 2 diabetes mellitus with hyperosmolar nonketotic hyperglycemia ([place]) 11/09/2017  .  History of migraine 07/15/2017  . IBS (irritable bowel syndrome) 07/15/2017  . Protein-calorie malnutrition, severe 07/14/2017  . Abdominal pain 07/13/2017  . GERD (gastroesophageal reflux disease) 12/30/2009  . Diabetes ([place]) 11/25/2009  . MDD (major depressive disorder) 11/25/2009  . Hypercholesterolemia 03/07/2008    Past Surgical History:  Procedure Laterality Date  . ABDOMINAL AORTOGRAM W/LOWER EXTREMITY Right 12/23/2017   Procedure: ABDOMINAL AORTOGRAM W/LOWER EXTREMITY;  Surgeon: Katha Cabal, MD;  Location: [facility];  Service: Cardiovascular;  Laterality: Right;  . AMPUTATION Right 12/24/2017   Procedure: AMPUTATION RAY;  Surgeon: Sharlotte Alamo, DPM;  Location: [facility];  Service: Podiatry;  Laterality: Right;  . APPLICATION OF WOUND VAC Right 12/24/2017   Procedure: APPLICATION OF WOUND VAC;  Surgeon: Sharlotte Alamo, DPM;  Location: [facility];  Service: Podiatry;  Laterality: Right;  . APPLICATION OF WOUND VAC Right 01/01/2018   Procedure: APPLICATION OF WOUND VAC;  Surgeon: Albertine Patricia, DPM;  Location: [facility];  Service: Podiatry;  Laterality: Right;  . IRRIGATION AND DEBRIDEMENT FOOT Right 12/20/2017   Procedure: IRRIGATION AND DEBRIDEMENT FOOT;  Surgeon: Sharlotte Alamo, DPM;  Location: [facility];  Service: Podiatry;  Laterality: Right;  . IRRIGATION AND DEBRIDEMENT FOOT Right 12/24/2017   Procedure: IRRIGATION AND DEBRIDEMENT FOOT;  Surgeon: Sharlotte Alamo, DPM;  Location: [facility];  Service: Podiatry;  Laterality: Right;  . IRRIGATION AND DEBRIDEMENT FOOT Right 01/01/2018   Procedure: IRRIGATION AND DEBRIDEMENT FOOT-SKIN,SOFT TISSUE AND BONE;  Surgeon: Albertine Patricia, DPM;  Location: [facility];  Service: Podiatry;  Laterality: Right;    Prior to Admission medications   Medication Sig Start Date End Date Taking? Authorizing Provider  ALPRAZolam (XANAX) 0.25 MG tablet Take 1 tablet (0.25 mg total) by mouth 3 (three) times daily as needed for anxiety. 12/27/17   Loletha Grayer, MD  ciprofloxacin (CIPRO) 500 MG tablet Take 1 tablet (500 mg total) by mouth 2 (two) times daily for 14 days. 02/07/18 02/21/18  Bettey Costa, MD  doxycycline (VIBRA-TABS) 100 MG tablet Take 1 tablet (100 mg total) by mouth every 12 (twelve) hours for 14 days. 02/07/18 02/21/18  Bettey Costa, MD  feeding supplement, GLUCERNA SHAKE, (GLUCERNA SHAKE) LIQD Take 237 mLs by mouth 3 (three) times daily between meals. 12/27/17   Loletha Grayer, MD  ferrous sulfate 325 (65 FE) MG tablet Take 1 tablet (325 mg total) by mouth 2 (two) times daily with a meal. 01/03/18   Sainani, Belia Heman, MD  FLUoxetine (PROZAC) 20 MG capsule Take 1 capsule (20 mg total) by mouth daily. Patient taking differently: Take 20 mg by mouth at bedtime.  11/12/17   Gladstone Lighter, MD  insulin glargine (LANTUS) 100 UNIT/ML injection Inject 35 Units into the skin 2 (two) times daily.     [provider]  Insulin Syringes, Disposable, U-100 0.3 ML MISC 1 Syringe by Does not apply route 4 (four) times daily -  with meals and at bedtime. 12/27/17   Loletha Grayer, MD  lisinopril (PRINIVIL,ZESTRIL) 10 MG tablet Take 1 tablet (10 mg total) by mouth daily. Patient taking differently: Take 10 mg by mouth at bedtime.  11/12/17   Gladstone Lighter, MD  metoCLOPramide (REGLAN) 10 MG tablet Take 1 tablet (10 mg total) by mouth every 6 (six) hours as needed for nausea, vomiting or refractory nausea / vomiting. 01/11/18   Demetrios Loll, MD  multivitamin (ONE-A-DAY MEN'S) TABS tablet Take 1 tablet by mouth at bedtime.     [provider]  NOVOLIN R RELION 100 UNIT/ML injection Inject 0-10 Units into the skin 3 (three) times daily as needed for high blood sugar (sliding scale).  12/27/17   [provider]  oxyCODONE-acetaminophen (PERCOCET/ROXICET) 5-325 MG tablet Take 1 tablet by mouth every 6 (six) hours as needed for moderate pain. Patient taking differently: Take 1 tablet by mouth every 4 (four) hours as needed for moderate  pain.  01/03/18   Henreitta Leber, MD  promethazine (PHENERGAN) 25 MG tablet Take 12.5 mg by mouth every 8 (eight) hours as needed for nausea or vomiting.     [provider]    Allergies Banana; Keflex [cephalexin]; Onion; Sulfa antibiotics; Grapeseed extract [nutritional supplements]; and Shellfish allergy  Family History  Problem Relation Age of Onset  . Diabetes Mother   . Ovarian cancer Mother     Social History Social History   Tobacco Use  . Smoking status: Never Smoker  . Smokeless tobacco: Never Used  Substance Use Topics  . Alcohol use: No    Frequency: Never  . Drug use: Yes    Types: Marijuana, PCP    Review of Systems Constitutional: No fever/chills Eyes: No visual changes. ENT: No sore throat. No stiff neck no neck pain Cardiovascular: Denies chest pain. Respiratory: Denies shortness of breath. Gastrointestinal:   See HPI genitourinary: Negative for dysuria. Musculoskeletal: Negative lower extremity swelling Skin: Negative for rash. Neurological: Negative for severe headaches, focal weakness or numbness.   ____________________________________________   PHYSICAL EXAM:  VITAL SIGNS: ED  Triage Vitals  Enc Vitals Group     BP 02/21/18 1804 124/90     Pulse Rate 02/21/18 1804 (!) 124     Resp 02/21/18 1804 16     Temp 02/21/18 1804 98.9 F (37.2 C)     Temp Source 02/21/18 1804 Oral     SpO2 02/21/18 1804 98 %     Weight 02/21/18 1805 125 lb (56.7 kg)     Height 02/21/18 1805 5\' 6"  (1.676 m)     Head Circumference --      Peak Flow --      Pain Score 02/21/18 1805 10     Pain Loc --      Pain Edu? --      Excl. in [place]? --     Constitutional: Alert and oriented.  Acute medical distress, conversing with this easily. Eyes: Conjunctivae are normal Head: Atraumatic HEENT: No congestion/rhinnorhea. Mucous membranes are moist.  Oropharynx non-erythematous Neck:   Nontender with no meningismus, no masses, no stridor Cardiovascular:  Tachycardia rate 106, regular rhythm. Grossly normal heart sounds.  Good peripheral circulation. Respiratory: Normal respiratory effort.  No retractions. Lungs CTAB. Abdominal: Soft and diffuse tenderness with no focality or guarding. No distention. No guarding no rebound Back:  There is no focal tenderness or step off.  there is no midline tenderness there are no lesions noted. there is no CVA tenderness Musculoskeletal: No lower extremity tenderness, no upper extremity tenderness. No joint effusions, no DVT signs strong distal pulses no edema Neurologic:  Normal speech and language. No gross focal neurologic deficits are appreciated.  Skin:  Skin is warm, dry and intact.  Take down the dressings on both feet.  There is a diabetic foot ulcer which does not appear to be infected on the dorsum of the left foot, and the wound VAC is in place with no evidence of surrounding cellulitis or evidence of acute infection to the right foot.  Pulses are intact. Psychiatric: Mood and affect are normal. Speech and behavior are normal.  ____________________________________________   LABS (all labs ordered are listed, but only abnormal results are displayed)  Labs Reviewed  GLUCOSE, CAPILLARY - Abnormal; Notable for the following components:      Result Value   Glucose-Capillary 432 (*)    All other components within normal limits  BLOOD GAS, VENOUS - Abnormal; Notable for the following components:   Bicarbonate 28.1 (*)    All other components within normal limits  GASTROINTESTINAL PANEL BY PCR, STOOL (REPLACES STOOL CULTURE)  C DIFFICILE QUICK SCREEN W PCR REFLEX  LIPASE, BLOOD  COMPREHENSIVE METABOLIC PANEL  CBC  LACTIC ACID, PLASMA  LACTIC ACID, PLASMA  URINALYSIS, COMPLETE (UACMP) WITH MICROSCOPIC    Pertinent labs  results that were available during my care of the patient were reviewed by me and considered in my medical decision making (see chart for  details). ____________________________________________  EKG  I personally interpreted any EKGs ordered by me or triage Sinus tach, rate 109 no acute ST elevation or depression nonspecific ST changes normal axis. ____________________________________________  RADIOLOGY  Pertinent labs & imaging results that were available during my care of the patient were reviewed by me and considered in my medical decision making (see chart for details). If possible, patient and/or family made aware of any abnormal findings.  No results found. ____________________________________________    PROCEDURES  Procedure(s) performed: None  Procedures  Critical Care performed: None  ____________________________________________   INITIAL IMPRESSION / ASSESSMENT AND PLAN /  ED COURSE  Pertinent labs & imaging results that were available during my care of the patient were reviewed by me and considered in my medical decision making (see chart for details).  Patient here after having vomiting and diarrhea today with elevated blood sugars.  Baseline sugars are over 200.  For some reason patient did not take any of his sliding scale medication since this morning.  Unclear if he is in DKA or just dehydrated.  Abdomen is nonsurgical.  We will give him IV fluids because he does appear to be somewhat dehydrated.  We will check a VBG, basic metabolic panel, etc. to look for DKA which is certainly possible.  We will give him insulin.  We will reassess closely.  ----------------------------------------- 6:47 PM on 02/21/2018 -----------------------------------------  VBG shows no evidence of acidemia we will start with subcu insulin.  ----------------------------------------- 9:49 PM on 02/21/2018 -----------------------------------------  No Evidence of DKA, no evidence of significant infection work-up is reassuring sugars are correcting heart rates coming down, we have given 3 L of fluid, is given [place] a urine  sample we will try to see if he can tolerate p.o. Here.  ----------------------------------------- 10:10 PM on 02/21/2018 -----------------------------------------  Your abdominal exams between no evidence of surgical pathology, he is resting comfortably in the bed heart rate in the mid 90s at this time, we will see if he can tolerate p.o. and if he can we will try to discharge.    ____________________________________________   FINAL CLINICAL IMPRESSION(S) / ED DIAGNOSES  Final diagnoses:  None      This chart was dictated using voice recognition software.  Despite best efforts to proofread,  errors can occur which can change meaning.      Schuyler Amor, MD 02/21/18 1847    Schuyler Amor, MD 02/21/18 Leanna Sato, MD 02/21/18 2149    Schuyler Amor, MD 02/21/18 2149    Schuyler Amor, MD 02/21/18 2210

## 2018-02-21 NOTE — ED Notes (Signed)
Pt given food & diet ginger ale to PO challenge per MD

## 2018-02-21 NOTE — ED Triage Notes (Signed)
Pt to ED via POV with c/o nausea and vomiting since this am. Pt also states he has not been able to control his glucose at home, last checked at home was 500. cbg 432 currently in triage. Pt has wound vac to RT foot noted. HR 120s. Pt A&OX4

## 2018-02-21 NOTE — ED Notes (Signed)
McShane MD at bedside. 

## 2018-02-21 NOTE — ED Notes (Signed)
Spoke with MD Archie Balboa about pt presentation, verbal orders for fluid bolus and vbg at this time

## 2018-02-25 LAB — BLOOD GAS, VENOUS
Acid-Base Excess: 1.4 mmol/L (ref 0.0–2.0)
BICARBONATE: 28.1 mmol/L — AB (ref 20.0–28.0)
Patient temperature: 37
pCO2, Ven: 52 mmHg (ref 44.0–60.0)
pH, Ven: 7.34 (ref 7.250–7.430)

## 2018-03-23 ENCOUNTER — Emergency Department: Payer: Medicaid Other

## 2018-03-23 ENCOUNTER — Emergency Department
Admission: EM | Admit: 2018-03-23 | Discharge: 2018-03-24 | Disposition: A | Discharge status: Home / Self Care | Payer: Medicaid Other | Attending: Emergency Medicine | Admitting: Emergency Medicine

## 2018-03-23 ENCOUNTER — Encounter: Payer: Self-pay | Admitting: Emergency Medicine

## 2018-03-23 ENCOUNTER — Other Ambulatory Visit: Payer: Self-pay

## 2018-03-23 DIAGNOSIS — Z79899 Other long term (current) drug therapy: Secondary | ICD-10-CM | POA: Insufficient documentation

## 2018-03-23 DIAGNOSIS — Z794 Long term (current) use of insulin: Secondary | ICD-10-CM | POA: Insufficient documentation

## 2018-03-23 DIAGNOSIS — F329 Major depressive disorder, single episode, unspecified: Secondary | ICD-10-CM | POA: Insufficient documentation

## 2018-03-23 DIAGNOSIS — K3184 Gastroparesis: Secondary | ICD-10-CM | POA: Insufficient documentation

## 2018-03-23 DIAGNOSIS — E10621 Type 1 diabetes mellitus with foot ulcer: Secondary | ICD-10-CM | POA: Insufficient documentation

## 2018-03-23 DIAGNOSIS — M79672 Pain in left foot: Secondary | ICD-10-CM | POA: Diagnosis present

## 2018-03-23 DIAGNOSIS — L97529 Non-pressure chronic ulcer of other part of left foot with unspecified severity: Secondary | ICD-10-CM | POA: Insufficient documentation

## 2018-03-23 DIAGNOSIS — E860 Dehydration: Secondary | ICD-10-CM | POA: Insufficient documentation

## 2018-03-23 LAB — CBC WITH DIFFERENTIAL/PLATELET
Basophils Absolute: 0.1 10*3/uL (ref 0–0.1)
Basophils Relative: 1 %
EOS ABS: 0.2 10*3/uL (ref 0–0.7)
Eosinophils Relative: 2 %
HEMATOCRIT: 33.2 % — AB (ref 40.0–52.0)
HEMOGLOBIN: 11.8 g/dL — AB (ref 13.0–18.0)
LYMPHS PCT: 27 %
Lymphs Abs: 2.7 10*3/uL (ref 1.0–3.6)
MCH: 28.5 pg (ref 26.0–34.0)
MCHC: 35.7 g/dL (ref 32.0–36.0)
MCV: 80 fL (ref 80.0–100.0)
Monocytes Absolute: 0.8 10*3/uL (ref 0.2–1.0)
Monocytes Relative: 8 %
NEUTROS ABS: 6.3 10*3/uL (ref 1.4–6.5)
Neutrophils Relative %: 62 %
Platelets: 380 10*3/uL (ref 150–440)
RBC: 4.14 MIL/uL — AB (ref 4.40–5.90)
RDW: 13.7 % (ref 11.5–14.5)
WBC: 10 10*3/uL (ref 3.8–10.6)

## 2018-03-23 LAB — COMPREHENSIVE METABOLIC PANEL
ALBUMIN: 3.6 g/dL (ref 3.5–5.0)
ALK PHOS: 88 U/L (ref 38–126)
ALT: 13 U/L (ref 0–44)
AST: 16 U/L (ref 15–41)
Anion gap: 9 (ref 5–15)
BILIRUBIN TOTAL: 0.1 mg/dL — AB (ref 0.3–1.2)
BUN: 20 mg/dL (ref 6–20)
CALCIUM: 9.2 mg/dL (ref 8.9–10.3)
CO2: 29 mmol/L (ref 22–32)
Chloride: 99 mmol/L (ref 98–111)
Creatinine, Ser: 2.06 mg/dL — ABNORMAL HIGH (ref 0.61–1.24)
GFR calc Af Amer: 48 mL/min — ABNORMAL LOW (ref >60)
GFR calc non Af Amer: 42 mL/min — ABNORMAL LOW (ref >60)
GLUCOSE: 194 mg/dL — AB (ref 70–99)
Potassium: 3.6 mmol/L (ref 3.5–5.1)
Sodium: 137 mmol/L (ref 135–145)
Total Protein: 7.6 g/dL (ref 6.5–8.1)

## 2018-03-23 LAB — LACTIC ACID, PLASMA: Lactic Acid, Venous: 1.3 mmol/L (ref 0.5–1.9)

## 2018-03-23 LAB — GLUCOSE, CAPILLARY: Glucose-Capillary: 217 mg/dL — ABNORMAL HIGH (ref 70–99)

## 2018-03-23 MED ORDER — LIDOCAINE HCL (PF) 1 % IJ SOLN
5.0000 mL | Freq: Once | INTRAMUSCULAR | Status: AC
  Administered 2018-03-23: 5 mL via INTRADERMAL
  Filled 2018-03-23: qty 5

## 2018-03-23 MED ORDER — SODIUM CHLORIDE 0.9 % IV BOLUS
1000.0000 mL | Freq: Once | INTRAVENOUS | Status: AC
  Administered 2018-03-24: 1000 mL via INTRAVENOUS

## 2018-03-23 MED ORDER — METOCLOPRAMIDE HCL 10 MG PO TABS: 10.0000 mg | ORAL_TABLET | Freq: Three times a day (TID) | ORAL | 0 refills | Status: DC

## 2018-03-23 MED ORDER — KETOROLAC TROMETHAMINE 30 MG/ML IJ SOLN
15.0000 mg | Freq: Once | INTRAMUSCULAR | Status: AC
  Administered 2018-03-23: 15 mg via INTRAVENOUS
  Filled 2018-03-23: qty 1

## 2018-03-23 MED ORDER — FENTANYL CITRATE (PF) 100 MCG/2ML IJ SOLN
75.0000 ug | Freq: Once | INTRAMUSCULAR | Status: AC
  Administered 2018-03-23: 75 ug via INTRAVENOUS
  Filled 2018-03-23: qty 2

## 2018-03-23 MED ORDER — HALOPERIDOL LACTATE 5 MG/ML IJ SOLN
2.5000 mg | Freq: Once | INTRAMUSCULAR | Status: AC
  Administered 2018-03-23: 2.5 mg via INTRAVENOUS
  Filled 2018-03-23: qty 1

## 2018-03-23 MED ORDER — SODIUM CHLORIDE 0.9 % IV BOLUS
1000.0000 mL | Freq: Once | INTRAVENOUS | Status: AC
  Administered 2018-03-23: 1000 mL via INTRAVENOUS

## 2018-03-23 NOTE — Discharge Instructions (Signed)
Today your foot looked very healthy.  You have strong pulses and no evidence of infection.  Your lab work was reassuring aside from some mild dehydration.  Please make sure you remain well-hydrated and follow-up with both your primary care physician and your podiatrist for reevaluation.  Return to the emergency department sooner for any concerns whatsoever.  It was a pleasure to take care of you today, and thank you for coming to our emergency department.  If you have any questions or concerns before leaving please ask the nurse to grab me and I'm more than happy to go through your aftercare instructions again.  If you were prescribed any opioid pain medication today such as Norco, Vicodin, Percocet, morphine, hydrocodone, or oxycodone please make sure you do not drive when you are taking this medication as it can alter your ability to drive safely.  If you have any concerns once you are home that you are not improving or are in fact getting worse before you can make it to your follow-up appointment, please do not hesitate to call 911 and come back for further evaluation.  Darel Hong, MD  Results for orders placed or performed during the hospital encounter of 03/23/18  Glucose, capillary  Result Value Ref Range   Glucose-Capillary 217 (H) 70 - 99 mg/dL  Comprehensive metabolic panel  Result Value Ref Range   Sodium 137 135 - 145 mmol/L   Potassium 3.6 3.5 - 5.1 mmol/L   Chloride 99 98 - 111 mmol/L   CO2 29 22 - 32 mmol/L   Glucose, Bld 194 (H) 70 - 99 mg/dL   BUN 20 6 - 20 mg/dL   Creatinine, Ser 2.06 (H) 0.61 - 1.24 mg/dL   Calcium 9.2 8.9 - 10.3 mg/dL   Total Protein 7.6 6.5 - 8.1 g/dL   Albumin 3.6 3.5 - 5.0 g/dL   AST 16 15 - 41 U/L   ALT 13 0 - 44 U/L   Alkaline Phosphatase 88 38 - 126 U/L   Total Bilirubin 0.1 (L) 0.3 - 1.2 mg/dL   GFR calc non Af Amer 42 (L) >60 mL/min   GFR calc Af Amer 48 (L) >60 mL/min   Anion gap 9 5 - 15  Lactic acid, plasma  Result Value Ref Range     Lactic Acid, Venous 1.3 0.5 - 1.9 mmol/L  CBC with Differential  Result Value Ref Range   WBC 10.0 3.8 - 10.6 K/uL   RBC 4.14 (L) 4.40 - 5.90 MIL/uL   Hemoglobin 11.8 (L) 13.0 - 18.0 g/dL   HCT 33.2 (L) 40.0 - 52.0 %   MCV 80.0 80.0 - 100.0 fL   MCH 28.5 26.0 - 34.0 pg   MCHC 35.7 32.0 - 36.0 g/dL   RDW 13.7 11.5 - 14.5 %   Platelets 380 150 - 440 K/uL   Neutrophils Relative % 62 %   Neutro Abs 6.3 1.4 - 6.5 K/uL   Lymphocytes Relative 27 %   Lymphs Abs 2.7 1.0 - 3.6 K/uL   Monocytes Relative 8 %   Monocytes Absolute 0.8 0.2 - 1.0 K/uL   Eosinophils Relative 2 %   Eosinophils Absolute 0.2 0 - 0.7 K/uL   Basophils Relative 1 %   Basophils Absolute 0.1 0 - 0.1 K/uL   Dg Foot Complete Left  Result Date: 03/23/2018 CLINICAL DATA:  Infection to the bottom of the foot EXAM: LEFT FOOT - COMPLETE 3+ VIEW COMPARISON:  02/06/2018 FINDINGS: Large ulcer plantar aspect lateral  side of the foot at the level of the MTP joint. Cortical thickening involving the proximal shaft of the fifth proximal phalanx consistent with healing fracture. No gross bone destruction. No radiopaque foreign body. IMPRESSION: Appearance suggests healing fracture of the fifth proximal phalanx. Deep ulcer at the level of fifth MTP joint without underlying osseous destruction. Electronically Signed   By: Donavan Foil M.D.   On: 03/23/2018 23:18

## 2018-03-23 NOTE — ED Triage Notes (Signed)
Patient to ER from home for c/o infection to bottom of left foot. Patient has wound vac to right leg currently as well. Denies any known fevers, but states he has vomited at least 10 times since yesterday, had a few episodes of diarrhea. States wound to bottom of foot looks worse to him.

## 2018-03-23 NOTE — ED Provider Notes (Signed)
[facility] Emergency Department Provider Note  ____________________________________________   First MD Initiated Contact with Patient 03/23/18 2245     (approximate)  I have reviewed the triage vital signs and the nursing notes.   HISTORY  Chief Complaint Wound Infection and Emesis    HPI Shawn [name] is a 32 y.o. male who comes to the emergency department with pain to the plantar aspect  of his left foot.  He has a complex past medical history including diabetes mellitus with a last known hemoglobin A1c of 17.  He has wounds to both of his feet and is followed by podiatry Dr. Caryl Comes.  He has a wound VAC to his right foot however wet-to-dry dressings with home health to his left foot.  Today his pain was worse which prompted the visit.  His pain is severe gradual onset worse with movement improved with rest.  He reports some nausea and vomiting.  He does feel like he is dehydrated.  No fevers or chills.  He is currently taking no antibiotics.   Past Medical History:  Diagnosis Date  . Diabetes mellitus without complication ([place])   . GERD (gastroesophageal reflux disease)   . IBS (irritable bowel syndrome)     Patient Active Problem List   Diagnosis Date Noted  . Foot infection 02/06/2018  . Osteomyelitis ([place]) 01/10/2018  . Cellulitis 12/29/2017  . Sepsis ([place]) 12/18/2017  . Moderate recurrent major depression ([place]) 11/10/2017  . Type 2 diabetes mellitus with hyperosmolar nonketotic hyperglycemia ([place]) 11/09/2017  . History of migraine 07/15/2017  . IBS (irritable bowel syndrome) 07/15/2017  . Protein-calorie malnutrition, severe 07/14/2017  . Abdominal pain 07/13/2017  . GERD (gastroesophageal reflux disease) 12/30/2009  . Diabetes ([place]) 11/25/2009  . MDD (major depressive disorder) 11/25/2009  . Hypercholesterolemia 03/07/2008    Past Surgical History:  Procedure Laterality Date  . ABDOMINAL AORTOGRAM W/LOWER EXTREMITY Right 12/23/2017   Procedure: ABDOMINAL AORTOGRAM W/LOWER EXTREMITY;  Surgeon: Katha Cabal, MD;  Location: [facility];  Service: Cardiovascular;  Laterality: Right;  . AMPUTATION Right 12/24/2017   Procedure: AMPUTATION RAY;  Surgeon: Sharlotte Alamo, DPM;  Location: [facility];  Service: Podiatry;  Laterality: Right;  . APPLICATION OF WOUND VAC Right 12/24/2017   Procedure: APPLICATION OF WOUND VAC;  Surgeon: Sharlotte Alamo, DPM;  Location: [facility];  Service: Podiatry;  Laterality: Right;  . APPLICATION OF WOUND VAC Right 01/01/2018   Procedure: APPLICATION OF WOUND VAC;  Surgeon: Albertine Patricia, DPM;  Location: [facility];  Service: Podiatry;  Laterality: Right;  . IRRIGATION AND DEBRIDEMENT FOOT Right 12/20/2017   Procedure: IRRIGATION AND DEBRIDEMENT FOOT;  Surgeon: Sharlotte Alamo, DPM;  Location: [facility];  Service: Podiatry;  Laterality: Right;  . IRRIGATION AND DEBRIDEMENT FOOT Right 12/24/2017   Procedure: IRRIGATION AND DEBRIDEMENT FOOT;  Surgeon: Sharlotte Alamo, DPM;  Location: [facility];  Service: Podiatry;  Laterality: Right;  . IRRIGATION AND DEBRIDEMENT FOOT Right 01/01/2018   Procedure: IRRIGATION AND DEBRIDEMENT FOOT-SKIN,SOFT TISSUE AND BONE;  Surgeon: Albertine Patricia, DPM;  Location: [facility];  Service: Podiatry;  Laterality: Right;    Prior to Admission medications   Medication Sig Start Date End Date Taking? Authorizing Provider  ALPRAZolam (XANAX) 0.25 MG tablet Take 1 tablet (0.25 mg total) by mouth 3 (three) times daily as needed for anxiety. 12/27/17   Loletha Grayer, MD  feeding supplement, GLUCERNA SHAKE, (GLUCERNA SHAKE) LIQD Take 237 mLs by mouth 3 (three) times daily between meals. 12/27/17   [place],  Richard, MD  ferrous sulfate 325 (65 FE) MG tablet Take 1 tablet (325 mg total) by mouth 2 (two) times daily with a meal. 01/03/18   Sainani, Belia Heman, MD  FLUoxetine (PROZAC) 20 MG capsule Take 1 capsule (20 mg total) by mouth daily. Patient taking differently: Take 20 mg by mouth at bedtime.   11/12/17   Gladstone Lighter, MD  insulin glargine (LANTUS) 100 UNIT/ML injection Inject 35 Units into the skin 2 (two) times daily.     [provider]  Insulin Syringes, Disposable, U-100 0.3 ML MISC 1 Syringe by Does not apply route 4 (four) times daily -  with meals and at bedtime. 12/27/17   Loletha Grayer, MD  lisinopril (PRINIVIL,ZESTRIL) 10 MG tablet Take 1 tablet (10 mg total) by mouth daily. Patient taking differently: Take 10 mg by mouth at bedtime.  11/12/17   Gladstone Lighter, MD  metoCLOPramide (REGLAN) 10 MG tablet Take 1 tablet (10 mg total) by mouth 3 (three) times daily with meals. 03/23/18 04/22/18  Darel Hong, MD  multivitamin (ONE-A-DAY MEN'S) TABS tablet Take 1 tablet by mouth at bedtime.     [provider]  NOVOLIN R RELION 100 UNIT/ML injection Inject 0-10 Units into the skin 3 (three) times daily as needed for high blood sugar (sliding scale).  12/27/17   [provider]  oxyCODONE-acetaminophen (PERCOCET/ROXICET) 5-325 MG tablet Take 1 tablet by mouth every 6 (six) hours as needed for moderate pain. Patient taking differently: Take 1 tablet by mouth every 4 (four) hours as needed for moderate pain.  01/03/18   Henreitta Leber, MD  promethazine (PHENERGAN) 25 MG tablet Take 12.5 mg by mouth every 8 (eight) hours as needed for nausea or vomiting.     [provider]    Allergies Banana; Keflex [cephalexin]; Onion; Sulfa antibiotics; Grapeseed extract [nutritional supplements]; and Shellfish allergy  Family History  Problem Relation Age of Onset  . Diabetes Mother   . Ovarian cancer Mother     Social History Social History   Tobacco Use  . Smoking status: Never Smoker  . Smokeless tobacco: Never Used  Substance Use Topics  . Alcohol use: No    Frequency: Never  . Drug use: Yes    Types: Marijuana, PCP    Review of Systems Constitutional: No fever/chills Eyes: No visual changes. ENT: No sore throat. Cardiovascular:  Denies chest pain. Respiratory: Denies shortness of breath. Gastrointestinal: No abdominal pain.  Positive for nausea, positive for vomiting.  No diarrhea.  No constipation. Genitourinary: Negative for dysuria. Musculoskeletal: Positive for foot pain Skin: Positive for wound Neurological: Negative for headaches, focal weakness or numbness.   ____________________________________________   PHYSICAL EXAM:  VITAL SIGNS: ED Triage Vitals  Enc Vitals Group     BP 03/23/18 2231 112/82     Pulse Rate 03/23/18 2231 (!) 117     Resp 03/23/18 2231 20     Temp 03/23/18 2231 98.7 F (37.1 C)     Temp src --      SpO2 03/23/18 2231 99 %     Weight 03/23/18 2236 115 lb (52.2 kg)     Height 03/23/18 2236 5\' 6"  (1.676 m)     Head Circumference --      Peak Flow --      Pain Score 03/23/18 2235 10     Pain Loc --      Pain Edu? --      Excl. in [place]? --  Constitutional: Alert and oriented x4 chronically ill-appearing and appears uncomfortable Eyes: PERRL EOMI. Head: Atraumatic. Nose: No congestion/rhinnorhea. Mouth/Throat: No trismus Neck: No stridor.   Cardiovascular: Tachycardic rate, regular rhythm. Grossly normal heart sounds.  Good peripheral circulation. Respiratory: Normal respiratory effort.  No retractions. Lungs CTAB and moving good air Gastrointestinal: Soft nontender Musculoskeletal: 2+ dorsalis pedis pulse bilaterally.  Please see photo below for the wound to the plantar surface of his left foot Neurologic:  Normal speech and language. No gross focal neurologic deficits are appreciated. Skin: 3 cm ulceration to the plantar surface of his left foot with good granulation tissue and overlying fibrinous tissue.  No evidence of active infection Psychiatric: Mood and affect are normal. Speech and behavior are normal.      ____________________________________________   DIFFERENTIAL includes but not limited to  Diabetic foot ulcer, osteomyelitis, sepsis, necrotizing soft  tissue infection ____________________________________________   LABS (all labs ordered are listed, but only abnormal results are displayed)  Labs Reviewed  GLUCOSE, CAPILLARY - Abnormal; Notable for the following components:      Result Value   Glucose-Capillary 217 (*)    All other components within normal limits  COMPREHENSIVE METABOLIC PANEL - Abnormal; Notable for the following components:   Glucose, Bld 194 (*)    Creatinine, Ser 2.06 (*)    Total Bilirubin 0.1 (*)    GFR calc non Af Amer 42 (*)    GFR calc Af Amer 48 (*)    All other components within normal limits  CBC WITH DIFFERENTIAL/PLATELET - Abnormal; Notable for the following components:   RBC 4.14 (*)    Hemoglobin 11.8 (*)    HCT 33.2 (*)    All other components within normal limits  LACTIC ACID, PLASMA    Lab work reviewed by me with slight increase in creatinine likely secondary to dehydration __________________________________________  EKG   ____________________________________________  RADIOLOGY  X-ray of the left foot reviewed by me with no evidence of osteomyelitis ____________________________________________   PROCEDURES  Procedure(s) performed: Yes  .Nerve Block Date/Time: 03/23/2018 11:24 PM Performed by: Darel Hong, MD Authorized by: Darel Hong, MD   Consent:    Consent obtained:  Verbal   Consent given by:  Patient   Risks discussed:  Allergic reaction, infection, intravenous injection, bleeding, pain, nerve damage and unsuccessful block Indications:    Indications:  Pain relief and procedural anesthesia Location:    Body area:  Lower extremity   Lower extremity nerve blocked: tibial.   Laterality:  Left Pre-procedure details:    Skin preparation:  Alcohol Skin anesthesia (see MAR for exact dosages):    Skin anesthesia method:  None Procedure details (see MAR for exact dosages):    Block needle gauge:  25 G   Guidance: ultrasound     Anesthetic injected:  Lidocaine  1% w/o epi   Steroid injected:  None   Additive injected:  None   Injection procedure:  Anatomic landmarks identified, anatomic landmarks palpated, incremental injection and negative aspiration for blood   Paresthesia:  Immediately resolved Post-procedure details:    Dressing:  None   Outcome:  Anesthesia achieved    Critical Care performed: no  ____________________________________________   INITIAL IMPRESSION / ASSESSMENT AND PLAN / ED COURSE  Pertinent labs & imaging results that were available during my care of the patient were reviewed by me and considered in my medical decision making (see chart for details).   As part of my medical decision making, I reviewed the following  data within the [place] History obtained from family if available, nursing notes, old chart and ekg, as well as notes from prior ED visits.  The patient comes to the emergency department slightly tachycardic and uncomfortable appearing however the wound to the base of his left foot actually appears quite healthy with good granulation tissue.  He does have quite a bit of fibrinous tissue over the top.  I verbally consented the patient for ultrasound-guided tibial nerve block which was performed achieving complete anesthesia.  I then sharp debrided the wound and it appeared healthy and bled briskly.  The patient's labs are concerning for mild dehydration.  We discussed marijuana cessation.  He already has home health established.  Pain is improved.  No indication for further antibiotics.  He will be discharged home in improved condition.        ____________________________________________   FINAL CLINICAL IMPRESSION(S) / ED DIAGNOSES  Final diagnoses:  Diabetic ulcer of left foot associated with type 1 diabetes mellitus, unspecified part of foot, unspecified ulcer stage ([place])  Gastroparesis  Dehydration      NEW MEDICATIONS STARTED DURING THIS VISIT:  Discharge Medication List as  of 03/23/2018 11:44 PM       Note:  This document was prepared using Dragon voice recognition software and may include unintentional dictation errors.     Darel Hong, MD 03/24/18 [phone]

## 2018-03-27 ENCOUNTER — Other Ambulatory Visit: Payer: Self-pay

## 2018-03-27 ENCOUNTER — Encounter: Payer: Self-pay | Admitting: Emergency Medicine

## 2018-03-27 ENCOUNTER — Emergency Department
Admission: EM | Admit: 2018-03-27 | Discharge: 2018-03-27 | Disposition: A | Discharge status: Home / Self Care | Payer: Medicaid Other | Attending: Emergency Medicine | Admitting: Emergency Medicine

## 2018-03-27 ENCOUNTER — Emergency Department: Payer: Medicaid Other

## 2018-03-27 DIAGNOSIS — K3184 Gastroparesis: Secondary | ICD-10-CM | POA: Diagnosis not present

## 2018-03-27 DIAGNOSIS — L97509 Non-pressure chronic ulcer of other part of unspecified foot with unspecified severity: Secondary | ICD-10-CM

## 2018-03-27 DIAGNOSIS — L97529 Non-pressure chronic ulcer of other part of left foot with unspecified severity: Secondary | ICD-10-CM | POA: Diagnosis not present

## 2018-03-27 DIAGNOSIS — Z794 Long term (current) use of insulin: Secondary | ICD-10-CM | POA: Insufficient documentation

## 2018-03-27 DIAGNOSIS — M79672 Pain in left foot: Secondary | ICD-10-CM | POA: Diagnosis present

## 2018-03-27 DIAGNOSIS — Z79899 Other long term (current) drug therapy: Secondary | ICD-10-CM | POA: Insufficient documentation

## 2018-03-27 DIAGNOSIS — M86272 Subacute osteomyelitis, left ankle and foot: Secondary | ICD-10-CM | POA: Diagnosis not present

## 2018-03-27 DIAGNOSIS — E10621 Type 1 diabetes mellitus with foot ulcer: Secondary | ICD-10-CM | POA: Diagnosis not present

## 2018-03-27 DIAGNOSIS — F329 Major depressive disorder, single episode, unspecified: Secondary | ICD-10-CM | POA: Diagnosis not present

## 2018-03-27 LAB — URINALYSIS, COMPLETE (UACMP) WITH MICROSCOPIC
Bacteria, UA: NONE SEEN
Bilirubin Urine: NEGATIVE
Ketones, ur: NEGATIVE mg/dL
Leukocytes, UA: NEGATIVE
NITRITE: NEGATIVE
Protein, ur: 100 mg/dL — AB
Specific Gravity, Urine: 1.018 (ref 1.005–1.030)
Squamous Epithelial / LPF: NONE SEEN (ref 0–5)
pH: 5 (ref 5.0–8.0)

## 2018-03-27 LAB — CBC
HCT: 28.6 % — ABNORMAL LOW (ref 40.0–52.0)
HEMOGLOBIN: 9.9 g/dL — AB (ref 13.0–18.0)
MCH: 28.4 pg (ref 26.0–34.0)
MCHC: 34.5 g/dL (ref 32.0–36.0)
MCV: 82.4 fL (ref 80.0–100.0)
Platelets: 307 10*3/uL (ref 150–440)
RBC: 3.48 MIL/uL — AB (ref 4.40–5.90)
RDW: 13.6 % (ref 11.5–14.5)
WBC: 8.1 10*3/uL (ref 3.8–10.6)

## 2018-03-27 LAB — COMPREHENSIVE METABOLIC PANEL
ALK PHOS: 70 U/L (ref 38–126)
ALT: 12 U/L (ref 0–44)
AST: 13 U/L — ABNORMAL LOW (ref 15–41)
Albumin: 3 g/dL — ABNORMAL LOW (ref 3.5–5.0)
Anion gap: 5 (ref 5–15)
BUN: 26 mg/dL — AB (ref 6–20)
CO2: 25 mmol/L (ref 22–32)
CREATININE: 1.31 mg/dL — AB (ref 0.61–1.24)
Calcium: 8.5 mg/dL — ABNORMAL LOW (ref 8.9–10.3)
Chloride: 104 mmol/L (ref 98–111)
Glucose, Bld: 405 mg/dL — ABNORMAL HIGH (ref 70–99)
Potassium: 4.6 mmol/L (ref 3.5–5.1)
Sodium: 134 mmol/L — ABNORMAL LOW (ref 135–145)
Total Bilirubin: 0.4 mg/dL (ref 0.3–1.2)
Total Protein: 6.1 g/dL — ABNORMAL LOW (ref 6.5–8.1)

## 2018-03-27 LAB — URINE DRUG SCREEN, QUALITATIVE (ARMC ONLY)
AMPHETAMINES, UR SCREEN: NOT DETECTED
BARBITURATES, UR SCREEN: NOT DETECTED
Benzodiazepine, Ur Scrn: NOT DETECTED
COCAINE METABOLITE, UR ~~LOC~~: NOT DETECTED
Cannabinoid 50 Ng, Ur ~~LOC~~: POSITIVE — AB
MDMA (Ecstasy)Ur Screen: NOT DETECTED
Methadone Scn, Ur: NOT DETECTED
Opiate, Ur Screen: NOT DETECTED
Phencyclidine (PCP) Ur S: NOT DETECTED
Tricyclic, Ur Screen: NOT DETECTED

## 2018-03-27 LAB — BLOOD GAS, VENOUS
Acid-Base Excess: 2.7 mmol/L — ABNORMAL HIGH (ref 0.0–2.0)
Bicarbonate: 28.4 mmol/L — ABNORMAL HIGH (ref 20.0–28.0)
O2 SAT: 69.3 %
PATIENT TEMPERATURE: 37
PH VEN: 7.38 (ref 7.250–7.430)
pCO2, Ven: 48 mmHg (ref 44.0–60.0)
pO2, Ven: 37 mmHg (ref 32.0–45.0)

## 2018-03-27 MED ORDER — METOCLOPRAMIDE HCL 5 MG/ML IJ SOLN
10.0000 mg | Freq: Once | INTRAMUSCULAR | Status: AC
  Administered 2018-03-27: 10 mg via INTRAVENOUS
  Filled 2018-03-27: qty 2

## 2018-03-27 MED ORDER — SODIUM CHLORIDE 0.9 % IV BOLUS
1000.0000 mL | Freq: Once | INTRAVENOUS | Status: AC
  Administered 2018-03-27: 1000 mL via INTRAVENOUS

## 2018-03-27 MED ORDER — CIPROFLOXACIN HCL 750 MG PO TABS: 750.0000 mg | ORAL_TABLET | Freq: Two times a day (BID) | ORAL | 0 refills | Status: DC

## 2018-03-27 MED ORDER — KETOROLAC TROMETHAMINE 30 MG/ML IJ SOLN
15.0000 mg | Freq: Once | INTRAMUSCULAR | Status: AC
  Administered 2018-03-27: 15 mg via INTRAVENOUS
  Filled 2018-03-27: qty 1

## 2018-03-27 NOTE — ED Provider Notes (Signed)
[facility] Emergency Department Provider Note  ____________________________________________   First MD Initiated Contact with Patient 03/27/18 1620     (approximate)  I have reviewed the triage vital signs and the nursing notes.   HISTORY  Chief Complaint Hyperglycemia; Foot Pain; and Fall   HPI Shawn [name] is a 32 y.o. male who comes to the emergency department with multiple issues.  He has a past medical history of poorly controlled type 1 diabetes along with ulcers to the bottom of his left foot as well as right foot.  He is concerned that the wound to the bottom of his left foot may have become infected.  He said that his home health nurse Cecille Rubin advised him to come to the emergency department today.  He has a long-standing history of gastroparesis secondary to poorly controlled diabetes with a last known A1c of 17.   He has had some nausea and vomiting for the past several days.  He actually has a wound VAC in place to his right foot however the wound to his left foot has no VAC.  He has had no fevers or chills.  His symptoms have been gradual onset slowly progressive are now moderate severity.  He is concerned because he has had normal blood sugars recently but this morning checked them and they became elevated which prompted the visit.   Past Medical History:  Diagnosis Date  . Diabetes mellitus without complication ([place])   . GERD (gastroesophageal reflux disease)   . IBS (irritable bowel syndrome)     Patient Active Problem List   Diagnosis Date Noted  . Foot infection 02/06/2018  . Osteomyelitis ([place]) 01/10/2018  . Cellulitis 12/29/2017  . Sepsis ([place]) 12/18/2017  . Moderate recurrent major depression ([place]) 11/10/2017  . Type 2 diabetes mellitus with hyperosmolar nonketotic hyperglycemia ([place]) 11/09/2017  . History of migraine 07/15/2017  . IBS (irritable bowel syndrome) 07/15/2017  . Protein-calorie malnutrition, severe 07/14/2017  .  Abdominal pain 07/13/2017  . GERD (gastroesophageal reflux disease) 12/30/2009  . Diabetes ([place]) 11/25/2009  . MDD (major depressive disorder) 11/25/2009  . Hypercholesterolemia 03/07/2008    Past Surgical History:  Procedure Laterality Date  . ABDOMINAL AORTOGRAM W/LOWER EXTREMITY Right 12/23/2017   Procedure: ABDOMINAL AORTOGRAM W/LOWER EXTREMITY;  Surgeon: Katha Cabal, MD;  Location: [place];  Service: Cardiovascular;  Laterality: Right;  . AMPUTATION Right 12/24/2017   Procedure: AMPUTATION RAY;  Surgeon: Sharlotte Alamo, DPM;  Location: [facility];  Service: Podiatry;  Laterality: Right;  . APPLICATION OF WOUND VAC Right 12/24/2017   Procedure: APPLICATION OF WOUND VAC;  Surgeon: Sharlotte Alamo, DPM;  Location: [facility];  Service: Podiatry;  Laterality: Right;  . APPLICATION OF WOUND VAC Right 01/01/2018   Procedure: APPLICATION OF WOUND VAC;  Surgeon: Albertine Patricia, DPM;  Location: [facility];  Service: Podiatry;  Laterality: Right;  . IRRIGATION AND DEBRIDEMENT FOOT Right 12/20/2017   Procedure: IRRIGATION AND DEBRIDEMENT FOOT;  Surgeon: Sharlotte Alamo, DPM;  Location: [facility];  Service: Podiatry;  Laterality: Right;  . IRRIGATION AND DEBRIDEMENT FOOT Right 12/24/2017   Procedure: IRRIGATION AND DEBRIDEMENT FOOT;  Surgeon: Sharlotte Alamo, DPM;  Location: [facility];  Service: Podiatry;  Laterality: Right;  . IRRIGATION AND DEBRIDEMENT FOOT Right 01/01/2018   Procedure: IRRIGATION AND DEBRIDEMENT FOOT-SKIN,SOFT TISSUE AND BONE;  Surgeon: Albertine Patricia, DPM;  Location: [facility];  Service: Podiatry;  Laterality: Right;    Prior to Admission medications   Medication Sig Start Date End  Date Taking? Authorizing Provider  ALPRAZolam (XANAX) 0.25 MG tablet Take 1 tablet (0.25 mg total) by mouth 3 (three) times daily as needed for anxiety. 12/27/17   Loletha Grayer, MD  ciprofloxacin (CIPRO) 750 MG tablet Take 1 tablet (750 mg total) by mouth 2 (two) times daily for 14 days. 03/27/18  04/10/18  Darel Hong, MD  feeding supplement, GLUCERNA SHAKE, (GLUCERNA SHAKE) LIQD Take 237 mLs by mouth 3 (three) times daily between meals. 12/27/17   Loletha Grayer, MD  ferrous sulfate 325 (65 FE) MG tablet Take 1 tablet (325 mg total) by mouth 2 (two) times daily with a meal. 01/03/18   Sainani, Belia Heman, MD  FLUoxetine (PROZAC) 20 MG capsule Take 1 capsule (20 mg total) by mouth daily. Patient taking differently: Take 20 mg by mouth at bedtime.  11/12/17   Gladstone Lighter, MD  insulin glargine (LANTUS) 100 UNIT/ML injection Inject 35 Units into the skin 2 (two) times daily.     [provider]  Insulin Syringes, Disposable, U-100 0.3 ML MISC 1 Syringe by Does not apply route 4 (four) times daily -  with meals and at bedtime. 12/27/17   Loletha Grayer, MD  lisinopril (PRINIVIL,ZESTRIL) 10 MG tablet Take 1 tablet (10 mg total) by mouth daily. Patient taking differently: Take 10 mg by mouth at bedtime.  11/12/17   Gladstone Lighter, MD  metoCLOPramide (REGLAN) 10 MG tablet Take 1 tablet (10 mg total) by mouth 3 (three) times daily with meals. 03/23/18 04/22/18  Darel Hong, MD  multivitamin (ONE-A-DAY MEN'S) TABS tablet Take 1 tablet by mouth at bedtime.     [provider]  NOVOLIN R RELION 100 UNIT/ML injection Inject 0-10 Units into the skin 3 (three) times daily as needed for high blood sugar (sliding scale).  12/27/17   [provider]  oxyCODONE-acetaminophen (PERCOCET/ROXICET) 5-325 MG tablet Take 1 tablet by mouth every 6 (six) hours as needed for moderate pain. Patient taking differently: Take 1 tablet by mouth every 4 (four) hours as needed for moderate pain.  01/03/18   Henreitta Leber, MD  promethazine (PHENERGAN) 25 MG tablet Take 12.5 mg by mouth every 8 (eight) hours as needed for nausea or vomiting.     [provider]    Allergies Banana; Keflex [cephalexin]; Onion; Sulfa antibiotics; Grapeseed extract [nutritional supplements]; and  Shellfish allergy  Family History  Problem Relation Age of Onset  . Diabetes Mother   . Ovarian cancer Mother     Social History Social History   Tobacco Use  . Smoking status: Never Smoker  . Smokeless tobacco: Never Used  Substance Use Topics  . Alcohol use: No    Frequency: Never  . Drug use: Yes    Types: Marijuana, PCP    Review of Systems Constitutional: No fever/chills Eyes: No visual changes. ENT: No sore throat. Cardiovascular: Denies chest pain. Respiratory: Denies shortness of breath. Gastrointestinal: Positive for abdominal pain.  Positive for nausea, positive for vomiting.  No diarrhea.  No constipation. Genitourinary: Negative for dysuria. Musculoskeletal: Positive for foot pain Skin: Positive for wound Neurological: Positive for headache   ____________________________________________   PHYSICAL EXAM:  VITAL SIGNS: ED Triage Vitals  Enc Vitals Group     BP 03/27/18 1502 116/81     Pulse Rate 03/27/18 1502 (!) 101     Resp --      Temp 03/27/18 1502 98.8 F (37.1 C)     Temp Source 03/27/18 1502 Oral     SpO2  03/27/18 1502 99 %     Weight 03/27/18 1502 115 lb (52.2 kg)     Height 03/27/18 1502 5\' 6"  (1.676 m)     Head Circumference --      Peak Flow --      Pain Score 03/27/18 1508 10     Pain Loc --      Pain Edu? --      Excl. in [place]? --    Constitutional: Alert and oriented x4 somewhat anxious appearing.  Cachectic and chronically ill-appearing.  No ketones on his breath Eyes: PERRL EOMI. midrange and brisk Head: Atraumatic. Nose: No congestion/rhinnorhea. Mouth/Throat: No trismus Neck: No stridor.   Cardiovascular: Tachycardic rate, regular rhythm. Grossly normal heart sounds.  Good peripheral circulation. Respiratory: Normal respiratory effort.  No retractions. Lungs CTAB and moving good air Gastrointestinal: Soft minimal diffuse tenderness although no rebound or guarding no peritonitis Musculoskeletal: No lower extremity edema 2+  dorsalis pedis pulse on the left Neurologic:  Normal speech and language. No gross focal neurologic deficits are appreciated. Skin: Wounds to the plantar aspect of his left foot appears quite healthy with good granulation tissue no evidence of infection.  Please see photo below. Psychiatric: Anxious appearing      ____________________________________________   DIFFERENTIAL includes but not limited to  DKA, HHS, osteomyelitis, sepsis, dehydration ____________________________________________   LABS (all labs ordered are listed, but only abnormal results are displayed)  Labs Reviewed  CBC - Abnormal; Notable for the following components:      Result Value   RBC 3.48 (*)    Hemoglobin 9.9 (*)    HCT 28.6 (*)    All other components within normal limits  URINALYSIS, COMPLETE (UACMP) WITH MICROSCOPIC - Abnormal; Notable for the following components:   Color, Urine STRAW (*)    APPearance CLEAR (*)    Glucose, UA >=500 (*)    Hgb urine dipstick MODERATE (*)    Protein, ur 100 (*)    All other components within normal limits  COMPREHENSIVE METABOLIC PANEL - Abnormal; Notable for the following components:   Sodium 134 (*)    Glucose, Bld 405 (*)    BUN 26 (*)    Creatinine, Ser 1.31 (*)    Calcium 8.5 (*)    Total Protein 6.1 (*)    Albumin 3.0 (*)    AST 13 (*)    All other components within normal limits  BLOOD GAS, VENOUS - Abnormal; Notable for the following components:   Bicarbonate 28.4 (*)    Acid-Base Excess 2.7 (*)    All other components within normal limits  URINE DRUG SCREEN, QUALITATIVE ([facility] ONLY) - Abnormal; Notable for the following components:   Cannabinoid 50 Ng, Ur [place] POSITIVE (*)    All other components within normal limits    Lab work reviewed by me shows significant improvement in his renal function.  He does have persistent glucosuria suggestive of poor glycemic control.  Normal anion gap with no evidence of DKA.  He remains cannabis  positive __________________________________________  EKG   ____________________________________________  RADIOLOGY  X-ray of his left foot reviewed by me consistent with either mild osteomyelitis or remote fracture ____________________________________________   PROCEDURES  Procedure(s) performed: no  Procedures  Critical Care performed: no  ____________________________________________   INITIAL IMPRESSION / ASSESSMENT AND PLAN / ED COURSE  Pertinent labs & imaging results that were available during my care of the patient were reviewed by me and considered in my medical decision making (see  chart for details).   As part of my medical decision making, I reviewed the following data within the [place] History obtained from family if available, nursing notes, old chart and ekg, as well as notes from prior ED visits.  I am very familiar with this patient and actually cared for him in the emergency department 4 days ago right sharp debrided his left foot wound and discharged him back home.  He appears significantly improved compared to the last visit and I think that there is a large anxiety component to his situation.  He says his home health nurse is the one who sent him to the emergency department so I will reach out to her now.      ----------------------------------------- 4:46 PM on 03/27/2018 ----------------------------------------- Spoke with the patient's home health nurse Cecille Rubin who said that she advised the patient come to the emergency department because he has had persistent nausea as well as noting that his blood pressure was elevated today.  She had never seen his left foot before and was not sure if it was infected or not so recommended he come in.  X-ray of the patient's foot is equivocal for osteomyelitis however he is clearly not septic and has strong peripheral pulses.  He has follow-up with his podiatrist in 2 days.  I offered the patient  inpatient admission for IV antibiotics and continued management of the wound however he declined stating he would prefer to try to go home which I actually think is entirely reasonable.  He has anaphylaxis to cephalosporins so I will cover him with ciprofloxacin in the meantime.  Strict return precautions have been given.  We once again discussed cannabis cessation as this could contribute to worsening gastroparesis.   ____________________________________________   FINAL CLINICAL IMPRESSION(S) / ED DIAGNOSES  Final diagnoses:  Subacute osteomyelitis of left foot (HCC)  Gastroparesis  Type 1 diabetes mellitus with foot ulcer ([place])      NEW MEDICATIONS STARTED DURING THIS VISIT:  Discharge Medication List as of 03/27/2018  5:42 PM       Note:  This document was prepared using Dragon voice recognition software and may include unintentional dictation errors.     Darel Hong, MD 03/28/18 [phone]

## 2018-03-27 NOTE — Discharge Instructions (Addendum)
Please perform wet to dry dressing changes on your left foot twice a day and take your antibiotics twice daily as prescribed.  Please make sure you follow-up with your podiatrist this coming Wednesday in 2 days as scheduled.  Return to the emergency department sooner for any concerns.  It was a pleasure to take care of you today, and thank you for coming to our emergency department.  If you have any questions or concerns before leaving please ask the nurse to grab me and I'm more than happy to go through your aftercare instructions again.  If you were prescribed any opioid pain medication today such as Norco, Vicodin, Percocet, morphine, hydrocodone, or oxycodone please make sure you do not drive when you are taking this medication as it can alter your ability to drive safely.  If you have any concerns once you are home that you are not improving or are in fact getting worse before you can make it to your follow-up appointment, please do not hesitate to call 911 and come back for further evaluation.  Shawn Hong, MD  Results for orders placed or performed during the hospital encounter of 03/27/18  CBC  Result Value Ref Range   WBC 8.1 3.8 - 10.6 K/uL   RBC 3.48 (L) 4.40 - 5.90 MIL/uL   Hemoglobin 9.9 (L) 13.0 - 18.0 g/dL   HCT 28.6 (L) 40.0 - 52.0 %   MCV 82.4 80.0 - 100.0 fL   MCH 28.4 26.0 - 34.0 pg   MCHC 34.5 32.0 - 36.0 g/dL   RDW 13.6 11.5 - 14.5 %   Platelets 307 150 - 440 K/uL  Urinalysis, Complete w Microscopic  Result Value Ref Range   Color, Urine STRAW (A) YELLOW   APPearance CLEAR (A) CLEAR   Specific Gravity, Urine 1.018 1.005 - 1.030   pH 5.0 5.0 - 8.0   Glucose, UA >=500 (A) NEGATIVE mg/dL   Hgb urine dipstick MODERATE (A) NEGATIVE   Bilirubin Urine NEGATIVE NEGATIVE   Ketones, ur NEGATIVE NEGATIVE mg/dL   Protein, ur 100 (A) NEGATIVE mg/dL   Nitrite NEGATIVE NEGATIVE   Leukocytes, UA NEGATIVE NEGATIVE   RBC / HPF 6-10 0 - 5 RBC/hpf   WBC, UA 0-5 0 - 5 WBC/hpf     Bacteria, UA NONE SEEN NONE SEEN   Squamous Epithelial / LPF NONE SEEN 0 - 5   Mucus PRESENT   Comprehensive metabolic panel  Result Value Ref Range   Sodium 134 (L) 135 - 145 mmol/L   Potassium 4.6 3.5 - 5.1 mmol/L   Chloride 104 98 - 111 mmol/L   CO2 25 22 - 32 mmol/L   Glucose, Bld 405 (H) 70 - 99 mg/dL   BUN 26 (H) 6 - 20 mg/dL   Creatinine, Ser 1.31 (H) 0.61 - 1.24 mg/dL   Calcium 8.5 (L) 8.9 - 10.3 mg/dL   Total Protein 6.1 (L) 6.5 - 8.1 g/dL   Albumin 3.0 (L) 3.5 - 5.0 g/dL   AST 13 (L) 15 - 41 U/L   ALT 12 0 - 44 U/L   Alkaline Phosphatase 70 38 - 126 U/L   Total Bilirubin 0.4 0.3 - 1.2 mg/dL   GFR calc non Af Amer >60 >60 mL/min   GFR calc Af Amer >60 >60 mL/min   Anion gap 5 5 - 15  Blood gas, venous  Result Value Ref Range   pH, Ven 7.38 7.250 - 7.430   pCO2, Ven 48 44.0 - 60.0 mmHg  pO2, Ven 37.0 32.0 - 45.0 mmHg   Bicarbonate 28.4 (H) 20.0 - 28.0 mmol/L   Acid-Base Excess 2.7 (H) 0.0 - 2.0 mmol/L   O2 Saturation 69.3 %   Patient temperature 37.0    Collection site VEIN    Sample type VENOUS    Dg Foot Complete Left  Result Date: 03/27/2018 CLINICAL DATA:  Chronic diabetic wound. Evaluate for osteomyelitis. Pain at left foot. EXAM: LEFT FOOT - COMPLETE 3+ VIEW COMPARISON:  03/23/2018 FINDINGS: Again noted is a fracture involving the fifth toe proximal phalanx. Callus formation at the fracture site with some residual lucency. Again noted is a displaced oblique fracture involving the fourth toe proximal phalanx that has minimally changed from the previous examination. No significant callus formation involving the fourth toe fracture. Again noted is soft tissue swelling along the lateral aspect of the foot near the fifth MTP joint. There is lucency associated the soft tissue swelling compatible with an ulceration. Negative for new fracture. Alignment of the left foot is unchanged. Vascular calcifications along the dorsal aspect of foot. IMPRESSION: Callus formation  and periosteal reaction involving the fifth toe proximal phalanx. Findings are likely related to a healing fracture but difficult to exclude osteomyelitis in the area due to the adjacent ulceration. No significant change in the minimally displaced fracture involving the fourth toe proximal phalanx. Electronically Signed   By: Markus Daft M.D.   On: 03/27/2018 17:22   Dg Foot Complete Left  Result Date: 03/23/2018 CLINICAL DATA:  Infection to the bottom of the foot EXAM: LEFT FOOT - COMPLETE 3+ VIEW COMPARISON:  02/06/2018 FINDINGS: Large ulcer plantar aspect lateral side of the foot at the level of the MTP joint. Cortical thickening involving the proximal shaft of the fifth proximal phalanx consistent with healing fracture. No gross bone destruction. No radiopaque foreign body. IMPRESSION: Appearance suggests healing fracture of the fifth proximal phalanx. Deep ulcer at the level of fifth MTP joint without underlying osseous destruction. Electronically Signed   By: Donavan Foil M.D.   On: 03/23/2018 23:18

## 2018-03-27 NOTE — ED Notes (Signed)
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

## 2018-03-27 NOTE — ED Triage Notes (Signed)
States he has home health. Is type 1 diabetic with wound vac to R foot for amputation in June and stated ulcer L foot. States his blood sugar was within normal range yesterday, reading 470 prior to coming here. Not able to control it with his coverage. Nausea and vomiting x 2 days.

## 2018-03-29 ENCOUNTER — Other Ambulatory Visit: Payer: Self-pay | Admitting: Internal Medicine

## 2018-03-29 DIAGNOSIS — L97523 Non-pressure chronic ulcer of other part of left foot with necrosis of muscle: Secondary | ICD-10-CM

## 2018-03-30 ENCOUNTER — Encounter (INDEPENDENT_AMBULATORY_CARE_PROVIDER_SITE_OTHER): Payer: Self-pay | Admitting: Vascular Surgery

## 2018-03-30 ENCOUNTER — Ambulatory Visit (INDEPENDENT_AMBULATORY_CARE_PROVIDER_SITE_OTHER): Payer: Medicaid Other | Admitting: Vascular Surgery

## 2018-03-30 ENCOUNTER — Encounter (INDEPENDENT_AMBULATORY_CARE_PROVIDER_SITE_OTHER): Payer: Self-pay

## 2018-03-30 VITALS — BP 107/71 | HR 59 | Resp 14 | Ht 66.5 in | Wt 110.0 lb

## 2018-03-30 DIAGNOSIS — M869 Osteomyelitis, unspecified: Secondary | ICD-10-CM

## 2018-03-30 DIAGNOSIS — E7800 Pure hypercholesterolemia, unspecified: Secondary | ICD-10-CM

## 2018-03-30 DIAGNOSIS — L97529 Non-pressure chronic ulcer of other part of left foot with unspecified severity: Secondary | ICD-10-CM

## 2018-03-30 DIAGNOSIS — E1100 Type 2 diabetes mellitus with hyperosmolarity without nonketotic hyperglycemic-hyperosmolar coma (NKHHC): Secondary | ICD-10-CM

## 2018-03-30 NOTE — Progress Notes (Signed)
Subjective:    Patient ID: Shawn [name], male    DOB: [birth year], 32 y.o.   MRN: [id number] Chief Complaint  Patient presents with  . New Patient (Initial Visit)    Left foot ulcer   Presents as a new patient referred by Dr. Cleda Mccreedy for evaluation of a non-healing ulcer to the lateral aspect of his left foot.  The patient endorses a history of a blister forming in July 2019.  Once the blister "popped", here was a "hole".  This area has not shown any signs of healing.  The patient has been seen multiple times in the emergency department for this.  The patient was gently seen by Dr. Cleda Mccreedy who notes the patient has a past medical history of his fourth and fifth ray resected on the right foot.  Cleda Mccreedy notes the patient had pulses on exam to the right foot and still needed amputation.  As per Dr. Sammuel Bailiff note the wound to the left lateral aspect of his foot has "worsened" - now with possible osteomyelitis.  The patient notes that he does experience bilateral calf cramping with ambulation.  He denies any rest pain or new/additional ulcer formation to the bilateral lower extremity.  The patient reports no issues with his incision/amputation site to the right foot.  Patient denies any fever, nausea vomiting.  Review of Systems  Constitutional: Negative.   HENT: Negative.   Eyes: Negative.   Respiratory: Negative.   Cardiovascular: Negative.   Gastrointestinal: Negative.   Endocrine: Negative.   Genitourinary: Negative.   Musculoskeletal: Negative.   Skin: Positive for wound.  Allergic/Immunologic: Negative.   Neurological: Negative.   Hematological: Negative.   Psychiatric/Behavioral: Negative.       Objective:   Physical Exam  Constitutional: He is oriented to person, place, and time. He appears well-developed and well-nourished. No distress.  HENT:  Head: Normocephalic and atraumatic.  Right Ear: External ear normal.  Left Ear: External ear normal.  Poor dentition.  Eyes: Pupils are  equal, round, and reactive to light. Conjunctivae are normal.  Neck: Normal range of motion.  Cardiovascular: Normal rate, regular rhythm, normal heart sounds and intact distal pulses.  Pulses:      Dorsalis pedis pulses are 1+ on the right side.       Posterior tibial pulses are 1+ on the right side.  Faint DP pulse to the left foot.  Unable to palpate a PT pulse.  The foot is warm.  Pulmonary/Chest: Effort normal and breath sounds normal.  Musculoskeletal: Normal range of motion. He exhibits edema (Mild edema to the left foot).  Neurological: He is alert and oriented to person, place, and time.  Skin: He is not diaphoretic.  Lateral aspect of the left foot: There is a 3 cm x 4 cm circular deep ulceration noted.  There is almost 100% granulation tissue to the area.  Surrounding skin is macerated.  There is a serosanguineous drainage noted.  No foul-smelling odor.  No necrotic tissue noted.  Psychiatric: He has a normal mood and affect. His behavior is normal. Judgment and thought content normal.  Vitals reviewed.  BP 107/71 (BP Location: Right Arm, Patient Position: Sitting)   Pulse (!) 59   Resp 14   Ht 5' 6.5" (1.689 m)   Wt 110 lb (49.9 kg)   BMI 17.49 kg/m   Past Medical History:  Diagnosis Date  . Diabetes mellitus without complication ([place])   . GERD (gastroesophageal reflux disease)   . IBS (irritable  bowel syndrome)    Social History   Socioeconomic History  . Marital status: Single    Spouse name: Not on file  . Number of children: Not on file  . Years of education: Not on file  . Highest education level: Not on file  Occupational History  . Not on file  Social Needs  . Financial resource strain: Not on file  . Food insecurity:    Worry: Not on file    Inability: Not on file  . Transportation needs:    Medical: Not on file    Non-medical: Not on file  Tobacco Use  . Smoking status: Never Smoker  . Smokeless tobacco: Never Used  Substance and Sexual Activity    . Alcohol use: No    Frequency: Never  . Drug use: Yes    Types: Marijuana, PCP  . Sexual activity: Not on file  Lifestyle  . Physical activity:    Days per week: Not on file    Minutes per session: Not on file  . Stress: Not on file  Relationships  . Social connections:    Talks on phone: Not on file    Gets together: Not on file    Attends religious service: Not on file    Active member of club or organization: Not on file    Attends meetings of clubs or organizations: Not on file    Relationship status: Not on file  . Intimate partner violence:    Fear of current or ex partner: Not on file    Emotionally abused: Not on file    Physically abused: Not on file    Forced sexual activity: Not on file  Other Topics Concern  . Not on file  Social History Narrative  . Not on file   Past Surgical History:  Procedure Laterality Date  . ABDOMINAL AORTOGRAM W/LOWER EXTREMITY Right 12/23/2017   Procedure: ABDOMINAL AORTOGRAM W/LOWER EXTREMITY;  Surgeon: Katha Cabal, MD;  Location: [place] [facility];  Service: Cardiovascular;  Laterality: Right;  . AMPUTATION Right 12/24/2017   Procedure: AMPUTATION RAY;  Surgeon: Sharlotte Alamo, DPM;  Location: [facility];  Service: Podiatry;  Laterality: Right;  . APPLICATION OF WOUND VAC Right 12/24/2017   Procedure: APPLICATION OF WOUND VAC;  Surgeon: Sharlotte Alamo, DPM;  Location: [facility];  Service: Podiatry;  Laterality: Right;  . APPLICATION OF WOUND VAC Right 01/01/2018   Procedure: APPLICATION OF WOUND VAC;  Surgeon: Albertine Patricia, DPM;  Location: [facility];  Service: Podiatry;  Laterality: Right;  . IRRIGATION AND DEBRIDEMENT FOOT Right 12/20/2017   Procedure: IRRIGATION AND DEBRIDEMENT FOOT;  Surgeon: Sharlotte Alamo, DPM;  Location: [facility];  Service: Podiatry;  Laterality: Right;  . IRRIGATION AND DEBRIDEMENT FOOT Right 12/24/2017   Procedure: IRRIGATION AND DEBRIDEMENT FOOT;  Surgeon: Sharlotte Alamo, DPM;  Location: [facility];  Service:  Podiatry;  Laterality: Right;  . IRRIGATION AND DEBRIDEMENT FOOT Right 01/01/2018   Procedure: IRRIGATION AND DEBRIDEMENT FOOT-SKIN,SOFT TISSUE AND BONE;  Surgeon: Albertine Patricia, DPM;  Location: [facility];  Service: Podiatry;  Laterality: Right;   Family History  Problem Relation Age of Onset  . Diabetes Mother   . Ovarian cancer Mother    Allergies  Allergen Reactions  . Banana Hives, Nausea And Vomiting and Rash  . Keflex [Cephalexin] Anaphylaxis  . Onion Hives, Nausea And Vomiting and Rash  . Sulfa Antibiotics Anaphylaxis  . Grapeseed Extract [Nutritional Supplements] Itching  . Shellfish Allergy Hives    "ALL SEAFOOD"  Assessment & Plan:  Presents as a new patient referred by Dr. Cleda Mccreedy for evaluation of a non-healing ulcer to the lateral aspect of his left foot.  The patient endorses a history of a blister forming in July 2019.  Once the blister "popped", here was a "hole".  This area has not shown any signs of healing.  The patient has been seen multiple times in the emergency department for this.  The patient was gently seen by Dr. Cleda Mccreedy who notes the patient has a past medical history of his fourth and fifth ray resected on the right foot.  Cleda Mccreedy notes the patient had pulses on exam to the right foot and still needed amputation.  As per Dr. Sammuel Bailiff note the wound to the left lateral aspect of his foot has "worsened" - now with possible osteomyelitis.  The patient notes that he does experience bilateral calf cramping with ambulation.  He denies any rest pain or new/additional ulcer formation to the bilateral lower extremity.  The patient reports no issues with his incision/amputation site to the right foot.  Patient denies any fever, nausea vomiting.  1. 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 Hypercholesterolemia - Stable Encouraged good control as its  slows the progression of atherosclerotic disease  3. Osteomyelitis of left foot, unspecified type [facility]) - New Patient with multiple risk factors for peripheral artery disease The patient does have a past medical history of requiring fourth and fifth metatarsal resection to the right foot. The patient presents with a nonhealing worsening ulceration to the left foot with possible osteo-myelitis. Hard to palpate pedal pulses to left foot Recommend a left lower extremity angiogram with possible intervention and attempt to assess the patient's anatomy and peripheral artery disease.  If appropriate, at that time an attempt to revascularize the leg can be made. Procedure, risks and benefits explained to the patient All questions answered Patient wishes to proceed  Current Outpatient Medications on File Prior to Visit  Medication Sig Dispense Refill  . Blood Glucose Monitoring Suppl (FIFTY50 GLUCOSE METER 2.0) w/Device KIT Use as directed    . ALPRAZolam (XANAX) 0.25 MG tablet Take 1 tablet (0.25 mg total) by mouth 3 (three) times daily as needed for anxiety. 15 tablet 0  . ciprofloxacin (CIPRO) 750 MG tablet Take 1 tablet (750 mg total) by mouth 2 (two) times daily for 14 days. 28 tablet 0  . feeding supplement, GLUCERNA SHAKE, (GLUCERNA SHAKE) LIQD Take 237 mLs by mouth 3 (three) times daily between meals. 60 Can 0  . ferrous sulfate 325 (65 FE) MG tablet Take 1 tablet (325 mg total) by mouth 2 (two) times daily with a meal. 60 tablet 0  . FLUoxetine (PROZAC) 20 MG capsule Take 1 capsule (20 mg total) by mouth daily. (Patient taking differently: Take 20 mg by mouth at bedtime. ) 30 capsule 3  . insulin glargine (LANTUS) 100 UNIT/ML injection Inject 35 Units into the skin 2 (two) times daily.     . Insulin Syringes, Disposable, U-100 0.3 ML MISC 1 Syringe by Does not apply route 4 (four) times daily -  with meals and at bedtime. 100 each 0  . lisinopril (PRINIVIL,ZESTRIL) 10 MG tablet Take 1 tablet  (10 mg total) by mouth daily. (Patient taking differently: Take 10 mg by mouth at bedtime. ) 30 tablet 2  . metoCLOPramide (REGLAN) 10 MG tablet Take 1 tablet (10 mg  total) by mouth 3 (three) times daily with meals. 90 tablet 0  . multivitamin (ONE-A-DAY MEN'S) TABS tablet Take 1 tablet by mouth at bedtime.     Marland Kitchen NOVOLIN R RELION 100 UNIT/ML injection Inject 0-10 Units into the skin 3 (three) times daily as needed for high blood sugar (sliding scale).   0  . oxyCODONE-acetaminophen (PERCOCET/ROXICET) 5-325 MG tablet Take 1 tablet by mouth every 6 (six) hours as needed for moderate pain. (Patient taking differently: Take 1 tablet by mouth every 4 (four) hours as needed for moderate pain. ) 20 tablet 0  . promethazine (PHENERGAN) 25 MG tablet Take 12.5 mg by mouth every 8 (eight) hours as needed for nausea or vomiting.      No current facility-administered medications on file prior to visit.     There are no Patient Instructions on file for this visit. No follow-ups on file.   KIMBERLY A STEGMAYER, PA-C

## 2018-04-05 ENCOUNTER — Encounter: Payer: Self-pay | Admitting: Emergency Medicine

## 2018-04-05 ENCOUNTER — Inpatient Hospital Stay
Admission: EM | Admit: 2018-04-05 | Discharge: 2018-04-11 | DRG: 854 | Disposition: A | Discharge status: Home Health | Payer: Medicaid Other | Attending: Specialist | Admitting: Specialist

## 2018-04-05 ENCOUNTER — Inpatient Hospital Stay: Payer: Medicaid Other

## 2018-04-05 ENCOUNTER — Emergency Department: Payer: Medicaid Other

## 2018-04-05 DIAGNOSIS — E1051 Type 1 diabetes mellitus with diabetic peripheral angiopathy without gangrene: Secondary | ICD-10-CM | POA: Diagnosis present

## 2018-04-05 DIAGNOSIS — F329 Major depressive disorder, single episode, unspecified: Secondary | ICD-10-CM | POA: Diagnosis present

## 2018-04-05 DIAGNOSIS — Z79899 Other long term (current) drug therapy: Secondary | ICD-10-CM

## 2018-04-05 DIAGNOSIS — L03119 Cellulitis of unspecified part of limb: Secondary | ICD-10-CM

## 2018-04-05 DIAGNOSIS — N189 Chronic kidney disease, unspecified: Secondary | ICD-10-CM | POA: Diagnosis not present

## 2018-04-05 DIAGNOSIS — Z794 Long term (current) use of insulin: Secondary | ICD-10-CM

## 2018-04-05 DIAGNOSIS — G43909 Migraine, unspecified, not intractable, without status migrainosus: Secondary | ICD-10-CM | POA: Diagnosis present

## 2018-04-05 DIAGNOSIS — L97529 Non-pressure chronic ulcer of other part of left foot with unspecified severity: Secondary | ICD-10-CM | POA: Diagnosis not present

## 2018-04-05 DIAGNOSIS — Z792 Long term (current) use of antibiotics: Secondary | ICD-10-CM

## 2018-04-05 DIAGNOSIS — E10649 Type 1 diabetes mellitus with hypoglycemia without coma: Secondary | ICD-10-CM | POA: Diagnosis not present

## 2018-04-05 DIAGNOSIS — R1110 Vomiting, unspecified: Secondary | ICD-10-CM | POA: Diagnosis not present

## 2018-04-05 DIAGNOSIS — M868X7 Other osteomyelitis, ankle and foot: Secondary | ICD-10-CM | POA: Diagnosis present

## 2018-04-05 DIAGNOSIS — E1043 Type 1 diabetes mellitus with diabetic autonomic (poly)neuropathy: Secondary | ICD-10-CM | POA: Diagnosis present

## 2018-04-05 DIAGNOSIS — E10621 Type 1 diabetes mellitus with foot ulcer: Secondary | ICD-10-CM | POA: Diagnosis present

## 2018-04-05 DIAGNOSIS — Z882 Allergy status to sulfonamides status: Secondary | ICD-10-CM

## 2018-04-05 DIAGNOSIS — D631 Anemia in chronic kidney disease: Secondary | ICD-10-CM | POA: Diagnosis present

## 2018-04-05 DIAGNOSIS — N183 Chronic kidney disease, stage 3 (moderate): Secondary | ICD-10-CM | POA: Diagnosis present

## 2018-04-05 DIAGNOSIS — I70208 Unspecified atherosclerosis of native arteries of extremities, other extremity: Secondary | ICD-10-CM | POA: Diagnosis present

## 2018-04-05 DIAGNOSIS — K580 Irritable bowel syndrome with diarrhea: Secondary | ICD-10-CM | POA: Diagnosis present

## 2018-04-05 DIAGNOSIS — Z89421 Acquired absence of other right toe(s): Secondary | ICD-10-CM

## 2018-04-05 DIAGNOSIS — L089 Local infection of the skin and subcutaneous tissue, unspecified: Secondary | ICD-10-CM | POA: Diagnosis not present

## 2018-04-05 DIAGNOSIS — R739 Hyperglycemia, unspecified: Secondary | ICD-10-CM

## 2018-04-05 DIAGNOSIS — Z91018 Allergy to other foods: Secondary | ICD-10-CM | POA: Diagnosis not present

## 2018-04-05 DIAGNOSIS — R197 Diarrhea, unspecified: Secondary | ICD-10-CM | POA: Diagnosis not present

## 2018-04-05 DIAGNOSIS — N179 Acute kidney failure, unspecified: Secondary | ICD-10-CM | POA: Diagnosis present

## 2018-04-05 DIAGNOSIS — R112 Nausea with vomiting, unspecified: Secondary | ICD-10-CM

## 2018-04-05 DIAGNOSIS — E1065 Type 1 diabetes mellitus with hyperglycemia: Secondary | ICD-10-CM | POA: Diagnosis present

## 2018-04-05 DIAGNOSIS — A419 Sepsis, unspecified organism: Secondary | ICD-10-CM | POA: Diagnosis present

## 2018-04-05 DIAGNOSIS — M869 Osteomyelitis, unspecified: Secondary | ICD-10-CM

## 2018-04-05 DIAGNOSIS — E1022 Type 1 diabetes mellitus with diabetic chronic kidney disease: Secondary | ICD-10-CM | POA: Diagnosis not present

## 2018-04-05 DIAGNOSIS — K3184 Gastroparesis: Secondary | ICD-10-CM | POA: Diagnosis not present

## 2018-04-05 DIAGNOSIS — I129 Hypertensive chronic kidney disease with stage 1 through stage 4 chronic kidney disease, or unspecified chronic kidney disease: Secondary | ICD-10-CM | POA: Diagnosis present

## 2018-04-05 DIAGNOSIS — G4700 Insomnia, unspecified: Secondary | ICD-10-CM | POA: Diagnosis present

## 2018-04-05 DIAGNOSIS — I739 Peripheral vascular disease, unspecified: Secondary | ICD-10-CM | POA: Diagnosis not present

## 2018-04-05 DIAGNOSIS — R6520 Severe sepsis without septic shock: Secondary | ICD-10-CM

## 2018-04-05 DIAGNOSIS — K219 Gastro-esophageal reflux disease without esophagitis: Secondary | ICD-10-CM | POA: Diagnosis present

## 2018-04-05 DIAGNOSIS — F419 Anxiety disorder, unspecified: Secondary | ICD-10-CM | POA: Diagnosis present

## 2018-04-05 DIAGNOSIS — Z881 Allergy status to other antibiotic agents status: Secondary | ICD-10-CM

## 2018-04-05 DIAGNOSIS — L03116 Cellulitis of left lower limb: Secondary | ICD-10-CM | POA: Diagnosis present

## 2018-04-05 DIAGNOSIS — Z833 Family history of diabetes mellitus: Secondary | ICD-10-CM

## 2018-04-05 DIAGNOSIS — E119 Type 2 diabetes mellitus without complications: Secondary | ICD-10-CM

## 2018-04-05 DIAGNOSIS — E1052 Type 1 diabetes mellitus with diabetic peripheral angiopathy with gangrene: Secondary | ICD-10-CM | POA: Diagnosis present

## 2018-04-05 DIAGNOSIS — Z8041 Family history of malignant neoplasm of ovary: Secondary | ICD-10-CM

## 2018-04-05 DIAGNOSIS — L02612 Cutaneous abscess of left foot: Secondary | ICD-10-CM | POA: Diagnosis present

## 2018-04-05 DIAGNOSIS — Z91013 Allergy to seafood: Secondary | ICD-10-CM

## 2018-04-05 DIAGNOSIS — K589 Irritable bowel syndrome without diarrhea: Secondary | ICD-10-CM | POA: Diagnosis not present

## 2018-04-05 DIAGNOSIS — Z79891 Long term (current) use of opiate analgesic: Secondary | ICD-10-CM

## 2018-04-05 DIAGNOSIS — R109 Unspecified abdominal pain: Secondary | ICD-10-CM | POA: Diagnosis not present

## 2018-04-05 DIAGNOSIS — E7800 Pure hypercholesterolemia, unspecified: Secondary | ICD-10-CM | POA: Diagnosis present

## 2018-04-05 LAB — URINALYSIS, COMPLETE (UACMP) WITH MICROSCOPIC
Bilirubin Urine: NEGATIVE
KETONES UR: 5 mg/dL — AB
LEUKOCYTES UA: NEGATIVE
NITRITE: NEGATIVE
PH: 5 (ref 5.0–8.0)
Protein, ur: 100 mg/dL — AB
Specific Gravity, Urine: 1.02 (ref 1.005–1.030)

## 2018-04-05 LAB — CREATININE, SERUM
Creatinine, Ser: 1.63 mg/dL — ABNORMAL HIGH (ref 0.61–1.24)
GFR calc non Af Amer: 55 mL/min — ABNORMAL LOW (ref >60)

## 2018-04-05 LAB — CBC WITH DIFFERENTIAL/PLATELET
BASOS ABS: 0.1 10*3/uL (ref 0–0.1)
BASOS PCT: 0 %
EOS ABS: 0 10*3/uL (ref 0–0.7)
Eosinophils Relative: 0 %
HCT: 27.8 % — ABNORMAL LOW (ref 40.0–52.0)
HEMOGLOBIN: 9.6 g/dL — AB (ref 13.0–18.0)
Lymphocytes Relative: 3 %
Lymphs Abs: 0.7 10*3/uL — ABNORMAL LOW (ref 1.0–3.6)
MCH: 28.7 pg (ref 26.0–34.0)
MCHC: 34.5 g/dL (ref 32.0–36.0)
MCV: 83.3 fL (ref 80.0–100.0)
MONOS PCT: 9 %
Monocytes Absolute: 1.9 10*3/uL — ABNORMAL HIGH (ref 0.2–1.0)
NEUTROS PCT: 88 %
Neutro Abs: 18 10*3/uL — ABNORMAL HIGH (ref 1.4–6.5)
Platelets: 323 10*3/uL (ref 150–440)
RBC: 3.33 MIL/uL — AB (ref 4.40–5.90)
RDW: 13.6 % (ref 11.5–14.5)
WBC: 20.8 10*3/uL — AB (ref 3.8–10.6)

## 2018-04-05 LAB — COMPREHENSIVE METABOLIC PANEL
ALT: 9 U/L (ref 0–44)
AST: 10 U/L — ABNORMAL LOW (ref 15–41)
Albumin: 2.9 g/dL — ABNORMAL LOW (ref 3.5–5.0)
Alkaline Phosphatase: 93 U/L (ref 38–126)
Anion gap: 15 (ref 5–15)
BUN: 33 mg/dL — ABNORMAL HIGH (ref 6–20)
CO2: 22 mmol/L (ref 22–32)
Calcium: 9 mg/dL (ref 8.9–10.3)
Chloride: 95 mmol/L — ABNORMAL LOW (ref 98–111)
Creatinine, Ser: 1.93 mg/dL — ABNORMAL HIGH (ref 0.61–1.24)
GFR, EST AFRICAN AMERICAN: 52 mL/min — AB (ref >60)
GFR, EST NON AFRICAN AMERICAN: 45 mL/min — AB (ref >60)
Glucose, Bld: 478 mg/dL — ABNORMAL HIGH (ref 70–99)
POTASSIUM: 4.4 mmol/L (ref 3.5–5.1)
Sodium: 132 mmol/L — ABNORMAL LOW (ref 135–145)
Total Bilirubin: 0.9 mg/dL (ref 0.3–1.2)
Total Protein: 7.3 g/dL (ref 6.5–8.1)

## 2018-04-05 LAB — BLOOD GAS, VENOUS
ACID-BASE DEFICIT: 1.7 mmol/L (ref 0.0–2.0)
Bicarbonate: 23 mmol/L (ref 20.0–28.0)
O2 SAT: 80.2 %
Patient temperature: 37
pCO2, Ven: 38 mmHg — ABNORMAL LOW (ref 44.0–60.0)
pH, Ven: 7.39 (ref 7.250–7.430)
pO2, Ven: 45 mmHg (ref 32.0–45.0)

## 2018-04-05 LAB — CBC
HCT: 27.1 % — ABNORMAL LOW (ref 40.0–52.0)
Hemoglobin: 9.2 g/dL — ABNORMAL LOW (ref 13.0–18.0)
MCH: 28.5 pg (ref 26.0–34.0)
MCHC: 33.8 g/dL (ref 32.0–36.0)
MCV: 84.2 fL (ref 80.0–100.0)
Platelets: 308 10*3/uL (ref 150–440)
RBC: 3.22 MIL/uL — ABNORMAL LOW (ref 4.40–5.90)
RDW: 13.5 % (ref 11.5–14.5)
WBC: 18.7 10*3/uL — AB (ref 3.8–10.6)

## 2018-04-05 LAB — LIPASE, BLOOD: Lipase: 19 U/L (ref 11–51)

## 2018-04-05 LAB — BETA-HYDROXYBUTYRIC ACID: Beta-Hydroxybutyric Acid: 1.16 mmol/L — ABNORMAL HIGH (ref 0.05–0.27)

## 2018-04-05 LAB — GLUCOSE, CAPILLARY
Glucose-Capillary: 448 mg/dL — ABNORMAL HIGH (ref 70–99)
Glucose-Capillary: 322 mg/dL — ABNORMAL HIGH (ref 70–99)
Glucose-Capillary: 249 mg/dL — ABNORMAL HIGH (ref 70–99)

## 2018-04-05 LAB — TROPONIN I: Troponin I: <0.03 ng/mL (ref <0.03)

## 2018-04-05 LAB — LACTIC ACID, PLASMA: LACTIC ACID, VENOUS: 1.3 mmol/L (ref 0.5–1.9)

## 2018-04-05 MED ORDER — SODIUM CHLORIDE 0.9 % IV SOLN
2.0000 g | Freq: Three times a day (TID) | INTRAVENOUS | Status: DC
  Administered 2018-04-05 – 2018-04-06 (×2): 2 g via INTRAVENOUS
  Filled 2018-04-05 (×5): qty 2

## 2018-04-05 MED ORDER — INSULIN ASPART 100 UNIT/ML ~~LOC~~ SOLN
5.0000 [IU] | Freq: Once | SUBCUTANEOUS | Status: AC
  Administered 2018-04-05: 5 [IU] via SUBCUTANEOUS
  Filled 2018-04-05: qty 1

## 2018-04-05 MED ORDER — FLUOXETINE HCL 20 MG PO CAPS
20.0000 mg | ORAL_CAPSULE | Freq: Every day | ORAL | Status: DC
  Administered 2018-04-05 – 2018-04-11 (×5): 20 mg via ORAL
  Filled 2018-04-05 (×7): qty 1

## 2018-04-05 MED ORDER — SODIUM CHLORIDE 0.9 % IV BOLUS (SEPSIS)
500.0000 mL | Freq: Once | INTRAVENOUS | Status: AC
  Administered 2018-04-05: 500 mL via INTRAVENOUS

## 2018-04-05 MED ORDER — LISINOPRIL 10 MG PO TABS
10.0000 mg | ORAL_TABLET | Freq: Every day | ORAL | Status: DC
  Administered 2018-04-05 – 2018-04-11 (×4): 10 mg via ORAL
  Filled 2018-04-05 (×4): qty 1

## 2018-04-05 MED ORDER — VANCOMYCIN HCL IN DEXTROSE 1-5 GM/200ML-% IV SOLN
1000.0000 mg | INTRAVENOUS | Status: DC
  Administered 2018-04-06 – 2018-04-10 (×5): 1000 mg via INTRAVENOUS
  Filled 2018-04-05 (×5): qty 200

## 2018-04-05 MED ORDER — MORPHINE SULFATE (PF) 2 MG/ML IV SOLN
2.0000 mg | INTRAVENOUS | Status: DC | PRN
  Administered 2018-04-06 – 2018-04-11 (×8): 2 mg via INTRAVENOUS
  Filled 2018-04-05 (×8): qty 1

## 2018-04-05 MED ORDER — ENOXAPARIN SODIUM 40 MG/0.4ML ~~LOC~~ SOLN
40.0000 mg | SUBCUTANEOUS | Status: DC
  Administered 2018-04-10: 40 mg via SUBCUTANEOUS
  Filled 2018-04-05 (×3): qty 0.4

## 2018-04-05 MED ORDER — METOCLOPRAMIDE HCL 10 MG PO TABS
10.0000 mg | ORAL_TABLET | Freq: Three times a day (TID) | ORAL | Status: DC
  Administered 2018-04-07 – 2018-04-11 (×11): 10 mg via ORAL
  Filled 2018-04-05 (×12): qty 1

## 2018-04-05 MED ORDER — FERROUS SULFATE 325 (65 FE) MG PO TABS
325.0000 mg | ORAL_TABLET | Freq: Two times a day (BID) | ORAL | Status: DC
  Administered 2018-04-05 – 2018-04-11 (×8): 325 mg via ORAL
  Filled 2018-04-05 (×9): qty 1

## 2018-04-05 MED ORDER — ONDANSETRON HCL 4 MG/2ML IJ SOLN
4.0000 mg | Freq: Four times a day (QID) | INTRAMUSCULAR | Status: DC | PRN
  Administered 2018-04-05 – 2018-04-10 (×5): 4 mg via INTRAVENOUS
  Filled 2018-04-05 (×5): qty 2

## 2018-04-05 MED ORDER — ONDANSETRON HCL 4 MG/2ML IJ SOLN
4.0000 mg | Freq: Once | INTRAMUSCULAR | Status: AC
  Administered 2018-04-05: 4 mg via INTRAVENOUS
  Filled 2018-04-05: qty 2

## 2018-04-05 MED ORDER — ALPRAZOLAM 0.25 MG PO TABS
0.2500 mg | ORAL_TABLET | Freq: Three times a day (TID) | ORAL | Status: DC | PRN
  Administered 2018-04-08 – 2018-04-09 (×3): 0.25 mg via ORAL
  Filled 2018-04-05 (×5): qty 1

## 2018-04-05 MED ORDER — VANCOMYCIN HCL IN DEXTROSE 1-5 GM/200ML-% IV SOLN
1000.0000 mg | Freq: Once | INTRAVENOUS | Status: AC
  Administered 2018-04-05: 1000 mg via INTRAVENOUS
  Filled 2018-04-05: qty 200

## 2018-04-05 MED ORDER — SODIUM CHLORIDE 0.9 % IV SOLN
INTRAVENOUS | Status: DC
  Administered 2018-04-05 – 2018-04-06 (×3): via INTRAVENOUS

## 2018-04-05 MED ORDER — INSULIN ASPART 100 UNIT/ML ~~LOC~~ SOLN
0.0000 [IU] | Freq: Three times a day (TID) | SUBCUTANEOUS | Status: DC
  Administered 2018-04-05: 7 [IU] via SUBCUTANEOUS
  Administered 2018-04-06: 3 [IU] via SUBCUTANEOUS
  Administered 2018-04-06: 5 [IU] via SUBCUTANEOUS
  Administered 2018-04-07 (×2): 7 [IU] via SUBCUTANEOUS
  Administered 2018-04-07: 3 [IU] via SUBCUTANEOUS
  Administered 2018-04-09: 2 [IU] via SUBCUTANEOUS
  Administered 2018-04-10: 3 [IU] via SUBCUTANEOUS
  Administered 2018-04-10 – 2018-04-11 (×3): 2 [IU] via SUBCUTANEOUS
  Administered 2018-04-11: 1 [IU] via SUBCUTANEOUS
  Administered 2018-04-11: 2 [IU] via SUBCUTANEOUS
  Filled 2018-04-05 (×13): qty 1

## 2018-04-05 MED ORDER — SODIUM CHLORIDE 0.9 % IV SOLN
2.0000 g | Freq: Once | INTRAVENOUS | Status: AC
  Administered 2018-04-05: 2 g via INTRAVENOUS
  Filled 2018-04-05: qty 2

## 2018-04-05 MED ORDER — SODIUM CHLORIDE 0.9 % IV BOLUS (SEPSIS)
250.0000 mL | Freq: Once | INTRAVENOUS | Status: AC
  Administered 2018-04-05: 250 mL via INTRAVENOUS

## 2018-04-05 MED ORDER — SODIUM CHLORIDE 0.9 % IV SOLN
Freq: Once | INTRAVENOUS | Status: AC
  Administered 2018-04-05: 15:00:00 via INTRAVENOUS

## 2018-04-05 MED ORDER — SODIUM CHLORIDE 0.9 % IV BOLUS (SEPSIS)
1000.0000 mL | Freq: Once | INTRAVENOUS | Status: AC
  Administered 2018-04-05: 1000 mL via INTRAVENOUS

## 2018-04-05 MED ORDER — INSULIN GLARGINE 100 UNIT/ML ~~LOC~~ SOLN
35.0000 [IU] | Freq: Two times a day (BID) | SUBCUTANEOUS | Status: DC
  Administered 2018-04-05 – 2018-04-06 (×2): 35 [IU] via SUBCUTANEOUS
  Filled 2018-04-05 (×3): qty 0.35

## 2018-04-05 MED ORDER — MORPHINE SULFATE (PF) 4 MG/ML IV SOLN
4.0000 mg | Freq: Once | INTRAVENOUS | Status: AC
  Administered 2018-04-05: 4 mg via INTRAVENOUS
  Filled 2018-04-05: qty 1

## 2018-04-05 MED ORDER — PROMETHAZINE HCL 25 MG PO TABS
12.5000 mg | ORAL_TABLET | Freq: Three times a day (TID) | ORAL | Status: DC | PRN
  Administered 2018-04-06 (×2): 12.5 mg via ORAL
  Filled 2018-04-05 (×3): qty 1

## 2018-04-05 MED ORDER — OXYCODONE-ACETAMINOPHEN 5-325 MG PO TABS
1.0000 | ORAL_TABLET | ORAL | Status: DC | PRN
  Administered 2018-04-05 (×2): 1 via ORAL
  Filled 2018-04-05 (×2): qty 1

## 2018-04-05 MED ORDER — ACETAMINOPHEN 650 MG RE SUPP
650.0000 mg | Freq: Four times a day (QID) | RECTAL | Status: DC | PRN
  Administered 2018-04-06: 650 mg via RECTAL
  Filled 2018-04-05 (×2): qty 1

## 2018-04-05 MED ORDER — ACETAMINOPHEN 325 MG PO TABS
650.0000 mg | ORAL_TABLET | Freq: Four times a day (QID) | ORAL | Status: DC | PRN
  Administered 2018-04-05: 650 mg via ORAL
  Filled 2018-04-05: qty 2

## 2018-04-05 MED ORDER — ONDANSETRON HCL 4 MG PO TABS: 4.0000 mg | ORAL_TABLET | Freq: Four times a day (QID) | ORAL | Status: DC | PRN

## 2018-04-05 NOTE — H&P (Signed)
[facility] [place] NAME: Shawn [name]    MR#:  [id number]  DATE OF BIRTH:  12/06/1985  DATE OF ADMISSION:  04/05/2018  PRIMARY CARE PHYSICIAN: Valera Castle, MD   REQUESTING/REFERRING PHYSICIAN:  Orbie Pyo, MD     CHIEF COMPLAINT:   Chief Complaint  Patient presents with  . Emesis  . Migraine    HISTORY OF PRESENT ILLNESS: Shawn [name]  is a 32 y.o. male with a known history of diabetes type 1, GERD, irritable bowel syndrome and recurrent headaches who is presenting to the emergency room with complaint of nausea vomiting and right foot swelling.  The patient's states that he has been having nausea and headache for 1 week now foot pain is chronic.  Is noted to have right foot that is swollen he has a ulcer bottom aspect of his foot. He also has fevers and chills.  PAST MEDICAL HISTORY:   Past Medical History:  Diagnosis Date  . Diabetes mellitus without complication ([place])   . GERD (gastroesophageal reflux disease)   . IBS (irritable bowel syndrome)     PAST SURGICAL HISTORY:  Past Surgical History:  Procedure Laterality Date  . ABDOMINAL AORTOGRAM W/LOWER EXTREMITY Right 12/23/2017   Procedure: ABDOMINAL AORTOGRAM W/LOWER EXTREMITY;  Surgeon: Katha Cabal, MD;  Location: [place] [facility];  Service: Cardiovascular;  Laterality: Right;  . AMPUTATION Right 12/24/2017   Procedure: AMPUTATION RAY;  Surgeon: Sharlotte Alamo, DPM;  Location: [facility];  Service: Podiatry;  Laterality: Right;  . APPLICATION OF WOUND VAC Right 12/24/2017   Procedure: APPLICATION OF WOUND VAC;  Surgeon: Sharlotte Alamo, DPM;  Location: [facility];  Service: Podiatry;  Laterality: Right;  . APPLICATION OF WOUND VAC Right 01/01/2018   Procedure: APPLICATION OF WOUND VAC;  Surgeon: Albertine Patricia, DPM;  Location: [facility];  Service: Podiatry;  Laterality: Right;  . IRRIGATION AND DEBRIDEMENT FOOT Right 12/20/2017   Procedure: IRRIGATION  AND DEBRIDEMENT FOOT;  Surgeon: Sharlotte Alamo, DPM;  Location: [facility];  Service: Podiatry;  Laterality: Right;  . IRRIGATION AND DEBRIDEMENT FOOT Right 12/24/2017   Procedure: IRRIGATION AND DEBRIDEMENT FOOT;  Surgeon: Sharlotte Alamo, DPM;  Location: [facility];  Service: Podiatry;  Laterality: Right;  . IRRIGATION AND DEBRIDEMENT FOOT Right 01/01/2018   Procedure: IRRIGATION AND DEBRIDEMENT FOOT-SKIN,SOFT TISSUE AND BONE;  Surgeon: Albertine Patricia, DPM;  Location: [facility];  Service: Podiatry;  Laterality: Right;    SOCIAL HISTORY:  Social History   Tobacco Use  . Smoking status: Never Smoker  . Smokeless tobacco: Never Used  Substance Use Topics  . Alcohol use: No    Frequency: Never    FAMILY HISTORY:  Family History  Problem Relation Age of Onset  . Diabetes Mother   . Ovarian cancer Mother     DRUG ALLERGIES:  Allergies  Allergen Reactions  . Banana Hives, Nausea And Vomiting and Rash  . Keflex [Cephalexin] Anaphylaxis  . Onion Hives, Nausea And Vomiting and Rash  . Sulfa Antibiotics Anaphylaxis  . Grapeseed Extract [Nutritional Supplements] Itching  . Shellfish Allergy Hives    "ALL SEAFOOD"    REVIEW OF SYSTEMS:   CONSTITUTIONAL: No fever, fatigue or weakness.  EYES: No blurred or double vision.  EARS, NOSE, AND THROAT: No tinnitus or ear pain.  RESPIRATORY: No cough, shortness of breath, wheezing or hemoptysis.  CARDIOVASCULAR: No chest pain, orthopnea, edema.  GASTROINTESTINAL: Positive nausea, vomiting, diarrhea or abdominal pain.  GENITOURINARY: No dysuria, hematuria.  ENDOCRINE: No polyuria, nocturia,  HEMATOLOGY: No anemia, easy bruising or bleeding SKIN: No rash or lesion. MUSCULOSKELETAL: Left foot swelling redness ulcer NEUROLOGIC: No tingling, numbness, weakness.  Positive headache PSYCHIATRY: No anxiety or depression.   MEDICATIONS AT HOME:  Prior to Admission medications   Medication Sig Start Date End Date Taking? Authorizing Provider  ALPRAZolam  (XANAX) 0.25 MG tablet Take 1 tablet (0.25 mg total) by mouth 3 (three) times daily as needed for anxiety. 12/27/17   Loletha Grayer, MD  Blood Glucose Monitoring Suppl (FIFTY50 GLUCOSE METER 2.0) w/Device KIT Use as directed 12/29/17 12/29/18  [provider]  ciprofloxacin (CIPRO) 750 MG tablet Take 1 tablet (750 mg total) by mouth 2 (two) times daily for 14 days. 03/27/18 04/10/18  Darel Hong, MD  feeding supplement, GLUCERNA SHAKE, (GLUCERNA SHAKE) LIQD Take 237 mLs by mouth 3 (three) times daily between meals. 12/27/17   Loletha Grayer, MD  ferrous sulfate 325 (65 FE) MG tablet Take 1 tablet (325 mg total) by mouth 2 (two) times daily with a meal. 01/03/18   Sainani, Belia Heman, MD  FLUoxetine (PROZAC) 20 MG capsule Take 1 capsule (20 mg total) by mouth daily. Patient taking differently: Take 20 mg by mouth at bedtime.  11/12/17   Gladstone Lighter, MD  insulin glargine (LANTUS) 100 UNIT/ML injection Inject 35 Units into the skin 2 (two) times daily.     [provider]  Insulin Syringes, Disposable, U-100 0.3 ML MISC 1 Syringe by Does not apply route 4 (four) times daily -  with meals and at bedtime. 12/27/17   Loletha Grayer, MD  lisinopril (PRINIVIL,ZESTRIL) 10 MG tablet Take 1 tablet (10 mg total) by mouth daily. Patient taking differently: Take 10 mg by mouth at bedtime.  11/12/17   Gladstone Lighter, MD  metoCLOPramide (REGLAN) 10 MG tablet Take 1 tablet (10 mg total) by mouth 3 (three) times daily with meals. 03/23/18 04/22/18  Darel Hong, MD  multivitamin (ONE-A-DAY MEN'S) TABS tablet Take 1 tablet by mouth at bedtime.     [provider]  NOVOLIN R RELION 100 UNIT/ML injection Inject 0-10 Units into the skin 3 (three) times daily as needed for high blood sugar (sliding scale).  12/27/17   [provider]  oxyCODONE-acetaminophen (PERCOCET/ROXICET) 5-325 MG tablet Take 1 tablet by mouth every 6 (six) hours as needed for moderate pain. Patient taking  differently: Take 1 tablet by mouth every 4 (four) hours as needed for moderate pain.  01/03/18   Henreitta Leber, MD  promethazine (PHENERGAN) 25 MG tablet Take 12.5 mg by mouth every 8 (eight) hours as needed for nausea or vomiting.     [provider]      PHYSICAL EXAMINATION:   VITAL SIGNS: Blood pressure 105/73, pulse (!) 126, temperature 100 F (37.8 C), temperature source Oral, resp. rate (!) 28, height '5\' 6"'$  (1.676 m), weight 56.7 kg, SpO2 96 %.  GENERAL:  32 y.o.-year-old patient lying in the bed with no acute distress.  EYES: Pupils equal, round, reactive to light and accommodation. No scleral icterus. Extraocular muscles intact.  HEENT: Head atraumatic, normocephalic. Oropharynx and nasopharynx clear.  NECK:  Supple, no jugular venous distention. No thyroid enlargement, no tenderness.  LUNGS: Normal breath sounds bilaterally, no wheezing, rales,rhonchi or crepitation. No use of accessory muscles of respiration.  CARDIOVASCULAR: S1, S2 normal. No murmurs, rubs, or gallops.  ABDOMEN: Soft, nontender, nondistended. Bowel sounds present. No organomegaly or mass.  EXTREMITIES: Right foot status post partial amputation,  left foot is swollen has a ulcer size of a nickel warm to touch redness NEUROLOGIC: Cranial nerves II through XII are intact. Muscle strength 5/5 in all extremities. Sensation intact. Gait not checked.  PSYCHIATRIC: The patient is alert and oriented x 3.  SKIN: No obvious rash, lesion, or ulcer.   LABORATORY PANEL:   CBC Recent Labs  Lab 04/05/18 1427  WBC 20.8*  HGB 9.6*  HCT 27.8*  PLT 323  MCV 83.3  MCH 28.7  MCHC 34.5  RDW 13.6  LYMPHSABS 0.7*  MONOABS 1.9*  EOSABS 0.0  BASOSABS 0.1   ------------------------------------------------------------------------------------------------------------------  Chemistries  Recent Labs  Lab 04/05/18 1427  NA 132*  K 4.4  CL 95*  CO2 22  GLUCOSE 478*  BUN 33*  CREATININE 1.93*  CALCIUM 9.0   AST 10*  ALT 9  ALKPHOS 93  BILITOT 0.9   ------------------------------------------------------------------------------------------------------------------ estimated creatinine clearance is 44.9 mL/min (A) (by C-G formula based on SCr of 1.93 mg/dL (H)). ------------------------------------------------------------------------------------------------------------------ No results for input(s): TSH, T4TOTAL, T3FREE, THYROIDAB in the last 72 hours.  Invalid input(s): FREET3   Coagulation profile No results for input(s): INR, PROTIME in the last 168 hours. ------------------------------------------------------------------------------------------------------------------- No results for input(s): DDIMER in the last 72 hours. -------------------------------------------------------------------------------------------------------------------  Cardiac Enzymes No results for input(s): CKMB, TROPONINI, MYOGLOBIN in the last 168 hours.  Invalid input(s): CK ------------------------------------------------------------------------------------------------------------------ Invalid input(s): POCBNP  ---------------------------------------------------------------------------------------------------------------  Urinalysis    Component Value Date/Time   COLORURINE STRAW (A) 03/27/2018 1514   APPEARANCEUR CLEAR (A) 03/27/2018 1514   APPEARANCEUR Clear 10/06/2014 0758   LABSPEC 1.018 03/27/2018 1514   LABSPEC 1.031 10/06/2014 0758   PHURINE 5.0 03/27/2018 1514   GLUCOSEU >=500 (A) 03/27/2018 1514   GLUCOSEU >=500 10/06/2014 0758   HGBUR MODERATE (A) 03/27/2018 1514   BILIRUBINUR NEGATIVE 03/27/2018 1514   BILIRUBINUR Negative 10/06/2014 0758   KETONESUR NEGATIVE 03/27/2018 1514   PROTEINUR 100 (A) 03/27/2018 1514   UROBILINOGEN 0.2 03/29/2007 1506   NITRITE NEGATIVE 03/27/2018 1514   LEUKOCYTESUR NEGATIVE 03/27/2018 1514   LEUKOCYTESUR Negative 10/06/2014 0758     RADIOLOGY: Dg Foot  2 Views Left  Result Date: 04/05/2018 CLINICAL DATA:  Diabetic patient with pain and swelling of the left foot centered laterally. EXAM: LEFT FOOT - 2 VIEW COMPARISON:  Plain films of the left foot 03/27/2018, 03/23/2018 and 02/26/2018. FINDINGS: Soft tissue swelling centered about the fifth MTP joint and fifth toe has worsened. Again seen is some periosteal reaction about the proximal phalanx of the fifth toe and rarefaction of bone in the head of the fifth metatarsal and base of the proximal phalanx. Imaged bones otherwise appear normal. No fracture is seen. IMPRESSION: Findings highly suspicious for osteomyelitis in the head of the fifth metatarsal and proximal phalanx of the little toe. Electronically Signed   By: Inge Rise M.D.   On: 04/05/2018 15:34    EKG: Orders placed or performed during the hospital encounter of 04/05/18  . ED EKG  . ED EKG  . ED EKG 12-Lead  . ED EKG 12-Lead    IMPRESSION AND PLAN: Patient is a 32 year old with left-sided foot pain and redness  1.  Left foot cellulitis with likely osteomyelitis We will treat with IV vancomycin and meropenem I will ask podiatry to see Patient will need ID evaluation as well  2.  Diabetes type 1 Placed on sliding scale insulin Continue Lantus  3.  Essential hypertension continue lisinopril  4.  Anxiety depression continue Prozac and alprazolam  5.  Recurrent headaches as needed pain medication  6.  Miscellaneous Lovenox for DVT prophylaxis   All the records are reviewed and case discussed with ED provider. Management plans discussed with the patient, family and they are in agreement.  CODE STATUS: Code Status History    Date Active Date Inactive Code Status Order ID Comments User Context   02/06/2018 2022 02/07/2018 1512 Full Code [id number]  Demetrios Loll, MD Inpatient   01/10/2018 2259 01/11/2018 1947 Full Code [id number]  Amelia Jo, MD Inpatient   12/30/2017 0019 01/03/2018 1457 Full Code [id number]  Harrie Foreman, MD Inpatient   12/20/2017 1541 12/27/2017 2147 Full Code [id number]  Sharlotte Alamo, [facility] Inpatient   11/10/2017 0049 11/11/2017 1632 Full Code [id number]  Amelia Jo, MD Inpatient   07/13/2017 2112 07/15/2017 1730 Full Code [id number]  Jules Husbands, MD ED       TOTAL TIME TAKING CARE OF THIS PATIENT: 55 minutes.    Dustin Flock M.D on 04/05/2018 at 3:43 PM  Between 7am to 6pm - Pager - [phone]  After 6pm go to [email or web address] - password [email or web address] [facility]  [phone]  CC: Primary care physician; Valera Castle, MD

## 2018-04-05 NOTE — Progress Notes (Signed)
CODE SEPSIS - PHARMACY COMMUNICATION  **Broad Spectrum Antibiotics should be administered within 1 hour of Sepsis diagnosis**  Time Code Sepsis Called/Page Received: 1515  Antibiotics Ordered: Aztreonam and vancomycin @ 1513  Time of 1st antibiotic administration: 1542  Additional action taken by pharmacy: n/a  If necessary, Name of Provider/Nurse Contacted: [place] ,PharmD Clinical Pharmacist  04/05/2018  3:18 PM

## 2018-04-05 NOTE — Consult Note (Signed)
Pharmacy Antibiotic Note  Shawn [name] is a 32 y.o. male admitted on 04/05/2018 with cellulitis and possible osteomyelitis.  Pharmacy has been consulted for aztreonam and vancomycin dosing.  Plan: Vancomycin 1000mg  IV once in the ED followed by 11 hour stacked dosing vancomycin 1000 mg IV every 24 hours.  Goal trough 15-20 mcg/mL.  Will draw trough prior to the fifth dose.  Aztreonam 2 gm IV every 8 hours  Height: 5\' 6"  (167.6 cm) Weight: 125 lb (56.7 kg) IBW/kg (Calculated) : 63.8  Temp (24hrs), Avg:99.8 F (37.7 C), Min:99.5 F (37.5 C), Max:100 F (37.8 C)  Recent Labs  Lab 04/05/18 1427 04/05/18 1739  WBC 20.8* 18.7*  CREATININE 1.93* 1.63*  LATICACIDVEN 1.3  --     Estimated Creatinine Clearance: 53.1 mL/min (A) (by C-G formula based on SCr of 1.63 mg/dL (H)).    Allergies  Allergen Reactions  . Banana Hives, Nausea And Vomiting and Rash  . Keflex [Cephalexin] Anaphylaxis  . Onion Hives, Nausea And Vomiting and Rash  . Sulfa Antibiotics Anaphylaxis  . Grapeseed Extract [Nutritional Supplements] Itching  . Shellfish Allergy Hives    "ALL SEAFOOD"    Antimicrobials this admission: Aztreonam 10/02 >>  Vancomycin 10/02 >>   Dose adjustments this admission:   Microbiology results: 10/02 BCx:   UCx:    Sputum:    MRSA PCR:   Thank you for allowing pharmacy to be a part of this patient's care.  Forrest Moron, PharmD 04/05/2018 7:05 PM

## 2018-04-05 NOTE — H&P (View-Only) (Signed)
Reason for Consult: Osteomyelitis left foot Referring Physician: Asad Keeven is an 32 y.o. male.  HPI: This is a 32 year old male with a history of diabetes poorly controlled with peripheral vascular disease.  Is previously undergone fourth and fifth ray resection on the right foot.  A couple of months ago he developed a sore on his left foot which over time has continued to progress.  He has presented to the emergency department on multiple occasions and has been on multiple rounds of antibiotics.  Presented today and was found to have a significantly elevated white count with significant infection in the left foot and was admitted for definitive treatment.  Past Medical History:  Diagnosis Date  . Diabetes mellitus without complication ([place])   . GERD (gastroesophageal reflux disease)   . IBS (irritable bowel syndrome)     Past Surgical History:  Procedure Laterality Date  . ABDOMINAL AORTOGRAM W/LOWER EXTREMITY Right 12/23/2017   Procedure: ABDOMINAL AORTOGRAM W/LOWER EXTREMITY;  Surgeon: Katha Cabal, MD;  Location: [place] [facility];  Service: Cardiovascular;  Laterality: Right;  . AMPUTATION Right 12/24/2017   Procedure: AMPUTATION RAY;  Surgeon: Sharlotte Alamo, DPM;  Location: [facility];  Service: Podiatry;  Laterality: Right;  . APPLICATION OF WOUND VAC Right 12/24/2017   Procedure: APPLICATION OF WOUND VAC;  Surgeon: Sharlotte Alamo, DPM;  Location: [facility];  Service: Podiatry;  Laterality: Right;  . APPLICATION OF WOUND VAC Right 01/01/2018   Procedure: APPLICATION OF WOUND VAC;  Surgeon: Albertine Patricia, DPM;  Location: [facility];  Service: Podiatry;  Laterality: Right;  . IRRIGATION AND DEBRIDEMENT FOOT Right 12/20/2017   Procedure: IRRIGATION AND DEBRIDEMENT FOOT;  Surgeon: Sharlotte Alamo, DPM;  Location: [facility];  Service: Podiatry;  Laterality: Right;  . IRRIGATION AND DEBRIDEMENT FOOT Right 12/24/2017   Procedure: IRRIGATION AND DEBRIDEMENT FOOT;  Surgeon: Sharlotte Alamo,  DPM;  Location: [facility];  Service: Podiatry;  Laterality: Right;  . IRRIGATION AND DEBRIDEMENT FOOT Right 01/01/2018   Procedure: IRRIGATION AND DEBRIDEMENT FOOT-SKIN,SOFT TISSUE AND BONE;  Surgeon: Albertine Patricia, DPM;  Location: [facility];  Service: Podiatry;  Laterality: Right;    Family History  Problem Relation Age of Onset  . Diabetes Mother   . Ovarian cancer Mother     Social History:  reports that he has never smoked. He has never used smokeless tobacco. He reports that he has current or past drug history. Drugs: Marijuana and PCP. He reports that he does not drink alcohol.  Allergies:  Allergies  Allergen Reactions  . Banana Hives, Nausea And Vomiting and Rash  . Keflex [Cephalexin] Anaphylaxis  . Onion Hives, Nausea And Vomiting and Rash  . Sulfa Antibiotics Anaphylaxis  . Grapeseed Extract [Nutritional Supplements] Itching  . Shellfish Allergy Hives    "ALL SEAFOOD"    Medications:  Scheduled: . enoxaparin (LOVENOX) injection  40 mg Subcutaneous Q24H  . ferrous sulfate  325 mg Oral BID WC  . FLUoxetine  20 mg Oral Daily  . insulin aspart  0-9 Units Subcutaneous TID WC  . insulin glargine  35 Units Subcutaneous BID  . lisinopril  10 mg Oral Daily  . [START ON 04/06/2018] metoCLOPramide  10 mg Oral TID WC    Results for orders placed or performed during the hospital encounter of 04/05/18 (from the past 48 hour(s))  Lactic acid, plasma     Status: None   Collection Time: 04/05/18  2:27 PM  Result Value Ref Range   Lactic Acid, Venous  1.3 0.5 - 1.9 mmol/L    Comment: Performed at [facility], [address] [place], [place] [address]  Comprehensive metabolic panel     Status: Abnormal   Collection Time: 04/05/18  2:27 PM  Result Value Ref Range   Sodium 132 (L) 135 - 145 mmol/L   Potassium 4.4 3.5 - 5.1 mmol/L   Chloride 95 (L) 98 - 111 mmol/L   CO2 22 22 - 32 mmol/L   Glucose, Bld 478 (H) 70 - 99 mg/dL   BUN 33 (H) 6 - 20 mg/dL   Creatinine, Ser  1.93 (H) 0.61 - 1.24 mg/dL   Calcium 9.0 8.9 - 10.3 mg/dL   Total Protein 7.3 6.5 - 8.1 g/dL   Albumin 2.9 (L) 3.5 - 5.0 g/dL   AST 10 (L) 15 - 41 U/L   ALT 9 0 - 44 U/L   Alkaline Phosphatase 93 38 - 126 U/L   Total Bilirubin 0.9 0.3 - 1.2 mg/dL   GFR calc non Af Amer 45 (L) >60 mL/min   GFR calc Af Amer 52 (L) >60 mL/min    Comment: (NOTE) The eGFR has been calculated using the CKD EPI equation. This calculation has not been validated in all clinical situations. eGFR's persistently <60 mL/min signify possible Chronic Kidney Disease.    Anion gap 15 5 - 15    Comment: Performed at [facility], [address] [place], [place] [address]  CBC with Differential     Status: Abnormal   Collection Time: 04/05/18  2:27 PM  Result Value Ref Range   WBC 20.8 (H) 3.8 - 10.6 K/uL   RBC 3.33 (L) 4.40 - 5.90 MIL/uL   Hemoglobin 9.6 (L) 13.0 - 18.0 g/dL   HCT 27.8 (L) 40.0 - 52.0 %   MCV 83.3 80.0 - 100.0 fL   MCH 28.7 26.0 - 34.0 pg   MCHC 34.5 32.0 - 36.0 g/dL   RDW 13.6 11.5 - 14.5 %   Platelets 323 150 - 440 K/uL   Neutrophils Relative % 88 %   Neutro Abs 18.0 (H) 1.4 - 6.5 K/uL   Lymphocytes Relative 3 %   Lymphs Abs 0.7 (L) 1.0 - 3.6 K/uL   Monocytes Relative 9 %   Monocytes Absolute 1.9 (H) 0.2 - 1.0 K/uL   Eosinophils Relative 0 %   Eosinophils Absolute 0.0 0 - 0.7 K/uL   Basophils Relative 0 %   Basophils Absolute 0.1 0 - 0.1 K/uL    Comment: Performed at [facility], [address] [place], [place] [address]  Lipase, blood     Status: None   Collection Time: 04/05/18  2:27 PM  Result Value Ref Range   Lipase 19 11 - 51 U/L    Comment: Performed at [facility], [address] [place], [place] [address]  Glucose, capillary     Status: Abnormal   Collection Time: 04/05/18  2:35 PM  Result Value Ref Range   Glucose-Capillary 448 (H) 70 - 99 mg/dL  Blood gas, venous     Status: Abnormal   Collection Time: 04/05/18  2:42 PM  Result Value  Ref Range   pH, Ven 7.39 7.250 - 7.430   pCO2, Ven 38 (L) 44.0 - 60.0 mmHg   pO2, Ven 45.0 32.0 - 45.0 mmHg   Bicarbonate 23.0 20.0 - 28.0 mmol/L   Acid-base deficit 1.7 0.0 - 2.0 mmol/L   O2 Saturation 80.2 %   Patient temperature 37.0  Collection site VEIN    Sample type VENOUS     Comment: Performed at [facility], [address] [place], [place] [address]  Glucose, capillary     Status: Abnormal   Collection Time: 04/05/18  5:31 PM  Result Value Ref Range   Glucose-Capillary 322 (H) 70 - 99 mg/dL   Comment 1 Notify RN     Dg Foot 2 Views Left  Result Date: 04/05/2018 CLINICAL DATA:  Diabetic patient with pain and swelling of the left foot centered laterally. EXAM: LEFT FOOT - 2 VIEW COMPARISON:  Plain films of the left foot 03/27/2018, 03/23/2018 and 02/26/2018. FINDINGS: Soft tissue swelling centered about the fifth MTP joint and fifth toe has worsened. Again seen is some periosteal reaction about the proximal phalanx of the fifth toe and rarefaction of bone in the head of the fifth metatarsal and base of the proximal phalanx. Imaged bones otherwise appear normal. No fracture is seen. IMPRESSION: Findings highly suspicious for osteomyelitis in the head of the fifth metatarsal and proximal phalanx of the little toe. Electronically Signed   By: Inge Rise M.D.   On: 04/05/2018 15:34    Review of Systems  Constitutional: Positive for chills and fever.  HENT: Negative.   Eyes: Negative.   Respiratory: Negative.   Cardiovascular: Negative.   Gastrointestinal: Positive for diarrhea and nausea.  Genitourinary: Negative.   Musculoskeletal:       Pain in the left foot  Skin:       Chronic ulceration on the left foot over the last couple of months which has been worsening.  Recent increased redness and swelling with drainage  Neurological:       No complaints of numbness or paresthesias  Endo/Heme/Allergies: Negative.   Psychiatric/Behavioral: Negative.    Blood  pressure (!) 130/95, pulse (!) 115, temperature 99.5 F (37.5 C), temperature source Oral, resp. rate (!) 21, height '5\' 6"'$  (1.676 m), weight 56.7 kg, SpO2 100 %. Physical Exam  Cardiovascular:  DP and PT pulses are fully palpable.  Musculoskeletal:  Previous fourth and fifth ray resection on the right foot.  Guarded and limited range and motion in the left foot from pain.  Muscle testing deferred.  Neurological:  Epicritic sensations appear to be grossly intact.  Significant pain response.  Skin:  The skin is warm dry and supple.  Significant edema with some erythema in the left lateral forefoot.  Full-thickness ulceration probing down to the level of bone beneath the fifth metatarsal head with an obvious abscess starting to protrude dorsally over the fifth metatarsal area.  Some devitalized skin slough now present around the base of the fourth toe.    Assessment/Plan: Assessment: 1.  Osteomyelitis left fifth metatarsal.  Diabetes with peripheral vascular disease.  Plan: Dressing applied to the left foot.  Discussed with the patient that he will need vascular assessment as well as debridement or amputation on the left foot.  Spoke with Dr. Lucky Cowboy who recommended holding n.p.o. after midnight and hopefully he will be able to perform angiogram tomorrow.  Ordered an MRI for evaluation of the extent of the infection in the left foot.  Patient will need surgery which at this point will be either tomorrow night or Friday morning.  We will wait for his MRI results and vascular procedure before committing to his surgery time.  [name] 04/05/2018, 5:47 PM

## 2018-04-05 NOTE — ED Triage Notes (Signed)
Patient presents to the ED with nausea, vomiting and diarrhea x 2 days with a migraine x 5 days.  Patient has type 1 diabetes and has had recent toe amputations in June and is scheduled for surgery next week to increase blood flow to left leg.  Patient appears very thin and very pale.

## 2018-04-05 NOTE — ED Notes (Signed)
Pt transported to 149

## 2018-04-05 NOTE — Consult Note (Signed)
Reason for Consult: Osteomyelitis left foot Referring Physician: [name] is an 32 y.o. male.  HPI: This is a 32 year old male with a history of diabetes poorly controlled with peripheral vascular disease.  Is previously undergone fourth and fifth ray resection on the right foot.  A couple of months ago he developed a sore on his left foot which over time has continued to progress.  He has presented to the emergency department on multiple occasions and has been on multiple rounds of antibiotics.  Presented today and was found to have a significantly elevated white count with significant infection in the left foot and was admitted for definitive treatment.  Past Medical History:  Diagnosis Date  . Diabetes mellitus without complication ([place])   . GERD (gastroesophageal reflux disease)   . IBS (irritable bowel syndrome)     Past Surgical History:  Procedure Laterality Date  . ABDOMINAL AORTOGRAM W/LOWER EXTREMITY Right 12/23/2017   Procedure: ABDOMINAL AORTOGRAM W/LOWER EXTREMITY;  Surgeon: Katha Cabal, MD;  Location: [place] [facility];  Service: Cardiovascular;  Laterality: Right;  . AMPUTATION Right 12/24/2017   Procedure: AMPUTATION RAY;  Surgeon: Sharlotte Alamo, DPM;  Location: [facility];  Service: Podiatry;  Laterality: Right;  . APPLICATION OF WOUND VAC Right 12/24/2017   Procedure: APPLICATION OF WOUND VAC;  Surgeon: Sharlotte Alamo, DPM;  Location: [facility];  Service: Podiatry;  Laterality: Right;  . APPLICATION OF WOUND VAC Right 01/01/2018   Procedure: APPLICATION OF WOUND VAC;  Surgeon: Albertine Patricia, DPM;  Location: [facility];  Service: Podiatry;  Laterality: Right;  . IRRIGATION AND DEBRIDEMENT FOOT Right 12/20/2017   Procedure: IRRIGATION AND DEBRIDEMENT FOOT;  Surgeon: Sharlotte Alamo, DPM;  Location: [facility];  Service: Podiatry;  Laterality: Right;  . IRRIGATION AND DEBRIDEMENT FOOT Right 12/24/2017   Procedure: IRRIGATION AND DEBRIDEMENT FOOT;  Surgeon: Sharlotte Alamo,  DPM;  Location: [facility];  Service: Podiatry;  Laterality: Right;  . IRRIGATION AND DEBRIDEMENT FOOT Right 01/01/2018   Procedure: IRRIGATION AND DEBRIDEMENT FOOT-SKIN,SOFT TISSUE AND BONE;  Surgeon: Albertine Patricia, DPM;  Location: [facility];  Service: Podiatry;  Laterality: Right;    Family History  Problem Relation Age of Onset  . Diabetes Mother   . Ovarian cancer Mother     Social History:  reports that he has never smoked. He has never used smokeless tobacco. He reports that he has current or past drug history. Drugs: Marijuana and PCP. He reports that he does not drink alcohol.  Allergies:  Allergies  Allergen Reactions  . Banana Hives, Nausea And Vomiting and Rash  . Keflex [Cephalexin] Anaphylaxis  . Onion Hives, Nausea And Vomiting and Rash  . Sulfa Antibiotics Anaphylaxis  . Grapeseed Extract [Nutritional Supplements] Itching  . Shellfish Allergy Hives    "ALL SEAFOOD"    Medications:  Scheduled: . enoxaparin (LOVENOX) injection  40 mg Subcutaneous Q24H  . ferrous sulfate  325 mg Oral BID WC  . FLUoxetine  20 mg Oral Daily  . insulin aspart  0-9 Units Subcutaneous TID WC  . insulin glargine  35 Units Subcutaneous BID  . lisinopril  10 mg Oral Daily  . [START ON 04/06/2018] metoCLOPramide  10 mg Oral TID WC    Results for orders placed or performed during the hospital encounter of 04/05/18 (from the past 48 hour(s))  Lactic acid, plasma     Status: None   Collection Time: 04/05/18  2:27 PM  Result Value Ref Range   Lactic Acid, Venous  1.3 0.5 - 1.9 mmol/L    Comment: Performed at [facility], [address] [place], [place] [address]  Comprehensive metabolic panel     Status: Abnormal   Collection Time: 04/05/18  2:27 PM  Result Value Ref Range   Sodium 132 (L) 135 - 145 mmol/L   Potassium 4.4 3.5 - 5.1 mmol/L   Chloride 95 (L) 98 - 111 mmol/L   CO2 22 22 - 32 mmol/L   Glucose, Bld 478 (H) 70 - 99 mg/dL   BUN 33 (H) 6 - 20 mg/dL   Creatinine, Ser  1.93 (H) 0.61 - 1.24 mg/dL   Calcium 9.0 8.9 - 10.3 mg/dL   Total Protein 7.3 6.5 - 8.1 g/dL   Albumin 2.9 (L) 3.5 - 5.0 g/dL   AST 10 (L) 15 - 41 U/L   ALT 9 0 - 44 U/L   Alkaline Phosphatase 93 38 - 126 U/L   Total Bilirubin 0.9 0.3 - 1.2 mg/dL   GFR calc non Af Amer 45 (L) >60 mL/min   GFR calc Af Amer 52 (L) >60 mL/min    Comment: (NOTE) The eGFR has been calculated using the CKD EPI equation. This calculation has not been validated in all clinical situations. eGFR's persistently <60 mL/min signify possible Chronic Kidney Disease.    Anion gap 15 5 - 15    Comment: Performed at [facility], [address] [place], [place] [address]  CBC with Differential     Status: Abnormal   Collection Time: 04/05/18  2:27 PM  Result Value Ref Range   WBC 20.8 (H) 3.8 - 10.6 K/uL   RBC 3.33 (L) 4.40 - 5.90 MIL/uL   Hemoglobin 9.6 (L) 13.0 - 18.0 g/dL   HCT 27.8 (L) 40.0 - 52.0 %   MCV 83.3 80.0 - 100.0 fL   MCH 28.7 26.0 - 34.0 pg   MCHC 34.5 32.0 - 36.0 g/dL   RDW 13.6 11.5 - 14.5 %   Platelets 323 150 - 440 K/uL   Neutrophils Relative % 88 %   Neutro Abs 18.0 (H) 1.4 - 6.5 K/uL   Lymphocytes Relative 3 %   Lymphs Abs 0.7 (L) 1.0 - 3.6 K/uL   Monocytes Relative 9 %   Monocytes Absolute 1.9 (H) 0.2 - 1.0 K/uL   Eosinophils Relative 0 %   Eosinophils Absolute 0.0 0 - 0.7 K/uL   Basophils Relative 0 %   Basophils Absolute 0.1 0 - 0.1 K/uL    Comment: Performed at [facility], [address] [place], [place] [address]  Lipase, blood     Status: None   Collection Time: 04/05/18  2:27 PM  Result Value Ref Range   Lipase 19 11 - 51 U/L    Comment: Performed at [facility], [address] [place], [place] [address]  Glucose, capillary     Status: Abnormal   Collection Time: 04/05/18  2:35 PM  Result Value Ref Range   Glucose-Capillary 448 (H) 70 - 99 mg/dL  Blood gas, venous     Status: Abnormal   Collection Time: 04/05/18  2:42 PM  Result Value  Ref Range   pH, Ven 7.39 7.250 - 7.430   pCO2, Ven 38 (L) 44.0 - 60.0 mmHg   pO2, Ven 45.0 32.0 - 45.0 mmHg   Bicarbonate 23.0 20.0 - 28.0 mmol/L   Acid-base deficit 1.7 0.0 - 2.0 mmol/L   O2 Saturation 80.2 %   Patient temperature 37.0  Collection site VEIN    Sample type VENOUS     Comment: Performed at [facility], [address] [place], [place] [address]  Glucose, capillary     Status: Abnormal   Collection Time: 04/05/18  5:31 PM  Result Value Ref Range   Glucose-Capillary 322 (H) 70 - 99 mg/dL   Comment 1 Notify RN     Dg Foot 2 Views Left  Result Date: 04/05/2018 CLINICAL DATA:  Diabetic patient with pain and swelling of the left foot centered laterally. EXAM: LEFT FOOT - 2 VIEW COMPARISON:  Plain films of the left foot 03/27/2018, 03/23/2018 and 02/26/2018. FINDINGS: Soft tissue swelling centered about the fifth MTP joint and fifth toe has worsened. Again seen is some periosteal reaction about the proximal phalanx of the fifth toe and rarefaction of bone in the head of the fifth metatarsal and base of the proximal phalanx. Imaged bones otherwise appear normal. No fracture is seen. IMPRESSION: Findings highly suspicious for osteomyelitis in the head of the fifth metatarsal and proximal phalanx of the little toe. Electronically Signed   By: Inge Rise M.D.   On: 04/05/2018 15:34    Review of Systems  Constitutional: Positive for chills and fever.  HENT: Negative.   Eyes: Negative.   Respiratory: Negative.   Cardiovascular: Negative.   Gastrointestinal: Positive for diarrhea and nausea.  Genitourinary: Negative.   Musculoskeletal:       Pain in the left foot  Skin:       Chronic ulceration on the left foot over the last couple of months which has been worsening.  Recent increased redness and swelling with drainage  Neurological:       No complaints of numbness or paresthesias  Endo/Heme/Allergies: Negative.   Psychiatric/Behavioral: Negative.    Blood  pressure (!) 130/95, pulse (!) 115, temperature 99.5 F (37.5 C), temperature source Oral, resp. rate (!) 21, height '5\' 6"'$  (1.676 m), weight 56.7 kg, SpO2 100 %. Physical Exam  Cardiovascular:  DP and PT pulses are fully palpable.  Musculoskeletal:  Previous fourth and fifth ray resection on the right foot.  Guarded and limited range and motion in the left foot from pain.  Muscle testing deferred.  Neurological:  Epicritic sensations appear to be grossly intact.  Significant pain response.  Skin:  The skin is warm dry and supple.  Significant edema with some erythema in the left lateral forefoot.  Full-thickness ulceration probing down to the level of bone beneath the fifth metatarsal head with an obvious abscess starting to protrude dorsally over the fifth metatarsal area.  Some devitalized skin slough now present around the base of the fourth toe.    Assessment/Plan: Assessment: 1.  Osteomyelitis left fifth metatarsal.  Diabetes with peripheral vascular disease.  Plan: Dressing applied to the left foot.  Discussed with the patient that he will need vascular assessment as well as debridement or amputation on the left foot.  Spoke with Dr. Lucky Cowboy who recommended holding n.p.o. after midnight and hopefully he will be able to perform angiogram tomorrow.  Ordered an MRI for evaluation of the extent of the infection in the left foot.  Patient will need surgery which at this point will be either tomorrow night or Friday morning.  We will wait for his MRI results and vascular procedure before committing to his surgery time.  [name] 04/05/2018, 5:47 PM

## 2018-04-05 NOTE — ED Provider Notes (Signed)
[facility] Emergency Department Provider Note  ___________________________________________   First MD Initiated Contact with Patient 04/05/18 1504     (approximate)  I have reviewed the triage vital signs and the nursing notes.   HISTORY  Chief Complaint Emesis and Migraine   HPI Shawn [name] is a 32 y.o. male with history of diabetes, GERD and osteomyelitis status post irritation of the lateral portion of the right foot who was presented to the emergency department today with 5 days of frontal headache as well as nausea and vomiting.  Patient also reports myalgia and swelling to his left foot.  Patient reports that he is scheduled to have a surgery to "increase the circulation" to the left lower extremity in the coming weeks.  Patient states the headache is a 10 out of 10 pounding pain associated with photophobia, nausea and vomiting consistent with previous migraines.  Denies any neck pain.  Denies any abdominal pain.  Past Medical History:  Diagnosis Date  . Diabetes mellitus without complication ([place])   . GERD (gastroesophageal reflux disease)   . IBS (irritable bowel syndrome)     Patient Active Problem List   Diagnosis Date Noted  . Foot infection 02/06/2018  . Osteomyelitis ([place]) 01/10/2018  . Cellulitis 12/29/2017  . Sepsis ([place]) 12/18/2017  . Moderate recurrent major depression ([place]) 11/10/2017  . Type 2 diabetes mellitus with hyperosmolar nonketotic hyperglycemia ([place]) 11/09/2017  . History of migraine 07/15/2017  . IBS (irritable bowel syndrome) 07/15/2017  . Protein-calorie malnutrition, severe 07/14/2017  . Abdominal pain 07/13/2017  . GERD (gastroesophageal reflux disease) 12/30/2009  . Diabetes ([place]) 11/25/2009  . MDD (major depressive disorder) 11/25/2009  . Hypercholesterolemia 03/07/2008    Past Surgical History:  Procedure Laterality Date  . ABDOMINAL AORTOGRAM W/LOWER EXTREMITY Right 12/23/2017   Procedure: ABDOMINAL  AORTOGRAM W/LOWER EXTREMITY;  Surgeon: Katha Cabal, MD;  Location: [place] [facility];  Service: Cardiovascular;  Laterality: Right;  . AMPUTATION Right 12/24/2017   Procedure: AMPUTATION RAY;  Surgeon: Sharlotte Alamo, DPM;  Location: [facility];  Service: Podiatry;  Laterality: Right;  . APPLICATION OF WOUND VAC Right 12/24/2017   Procedure: APPLICATION OF WOUND VAC;  Surgeon: Sharlotte Alamo, DPM;  Location: [facility];  Service: Podiatry;  Laterality: Right;  . APPLICATION OF WOUND VAC Right 01/01/2018   Procedure: APPLICATION OF WOUND VAC;  Surgeon: Albertine Patricia, DPM;  Location: [facility];  Service: Podiatry;  Laterality: Right;  . IRRIGATION AND DEBRIDEMENT FOOT Right 12/20/2017   Procedure: IRRIGATION AND DEBRIDEMENT FOOT;  Surgeon: Sharlotte Alamo, DPM;  Location: [facility];  Service: Podiatry;  Laterality: Right;  . IRRIGATION AND DEBRIDEMENT FOOT Right 12/24/2017   Procedure: IRRIGATION AND DEBRIDEMENT FOOT;  Surgeon: Sharlotte Alamo, DPM;  Location: [facility];  Service: Podiatry;  Laterality: Right;  . IRRIGATION AND DEBRIDEMENT FOOT Right 01/01/2018   Procedure: IRRIGATION AND DEBRIDEMENT FOOT-SKIN,SOFT TISSUE AND BONE;  Surgeon: Albertine Patricia, DPM;  Location: [facility];  Service: Podiatry;  Laterality: Right;    Prior to Admission medications   Medication Sig Start Date End Date Taking? Authorizing Provider  ALPRAZolam (XANAX) 0.25 MG tablet Take 1 tablet (0.25 mg total) by mouth 3 (three) times daily as needed for anxiety. 12/27/17   Loletha Grayer, MD  Blood Glucose Monitoring Suppl (FIFTY50 GLUCOSE METER 2.0) w/Device KIT Use as directed 12/29/17 12/29/18  [provider]  ciprofloxacin (CIPRO) 750 MG tablet Take 1 tablet (750 mg total) by mouth 2 (two) times daily for 14 days.  03/27/18 04/10/18  Darel Hong, MD  feeding supplement, GLUCERNA SHAKE, (GLUCERNA SHAKE) LIQD Take 237 mLs by mouth 3 (three) times daily between meals. 12/27/17   Loletha Grayer, MD  ferrous sulfate 325 (65  FE) MG tablet Take 1 tablet (325 mg total) by mouth 2 (two) times daily with a meal. 01/03/18   Sainani, Belia Heman, MD  FLUoxetine (PROZAC) 20 MG capsule Take 1 capsule (20 mg total) by mouth daily. Patient taking differently: Take 20 mg by mouth at bedtime.  11/12/17   Gladstone Lighter, MD  insulin glargine (LANTUS) 100 UNIT/ML injection Inject 35 Units into the skin 2 (two) times daily.     [provider]  Insulin Syringes, Disposable, U-100 0.3 ML MISC 1 Syringe by Does not apply route 4 (four) times daily -  with meals and at bedtime. 12/27/17   Loletha Grayer, MD  lisinopril (PRINIVIL,ZESTRIL) 10 MG tablet Take 1 tablet (10 mg total) by mouth daily. Patient taking differently: Take 10 mg by mouth at bedtime.  11/12/17   Gladstone Lighter, MD  metoCLOPramide (REGLAN) 10 MG tablet Take 1 tablet (10 mg total) by mouth 3 (three) times daily with meals. 03/23/18 04/22/18  Darel Hong, MD  multivitamin (ONE-A-DAY MEN'S) TABS tablet Take 1 tablet by mouth at bedtime.     [provider]  NOVOLIN R RELION 100 UNIT/ML injection Inject 0-10 Units into the skin 3 (three) times daily as needed for high blood sugar (sliding scale).  12/27/17   [provider]  oxyCODONE-acetaminophen (PERCOCET/ROXICET) 5-325 MG tablet Take 1 tablet by mouth every 6 (six) hours as needed for moderate pain. Patient taking differently: Take 1 tablet by mouth every 4 (four) hours as needed for moderate pain.  01/03/18   Henreitta Leber, MD  promethazine (PHENERGAN) 25 MG tablet Take 12.5 mg by mouth every 8 (eight) hours as needed for nausea or vomiting.     [provider]    Allergies Banana; Keflex [cephalexin]; Onion; Sulfa antibiotics; Grapeseed extract [nutritional supplements]; and Shellfish allergy  Family History  Problem Relation Age of Onset  . Diabetes Mother   . Ovarian cancer Mother     Social History Social History   Tobacco Use  . Smoking status: Never Smoker  .  Smokeless tobacco: Never Used  Substance Use Topics  . Alcohol use: No    Frequency: Never  . Drug use: Yes    Types: Marijuana, PCP    Review of Systems  Constitutional: Chills. Eyes: No visual changes. ENT: No sore throat. Cardiovascular: Denies chest pain. Respiratory: Denies shortness of breath. Gastrointestinal: No abdominal pain.  No nausea, no vomiting.  No diarrhea.  No constipation. Genitourinary: Negative for dysuria. Musculoskeletal: Myalgia Skin: Negative for rash. Neurological: Negative for focal weakness or numbness.   ____________________________________________   PHYSICAL EXAM:  VITAL SIGNS: ED Triage Vitals  Enc Vitals Group     BP 04/05/18 1413 105/73     Pulse Rate 04/05/18 1413 (!) 126     Resp 04/05/18 1413 (!) 28     Temp 04/05/18 1413 100 F (37.8 C)     Temp Source 04/05/18 1413 Oral     SpO2 04/05/18 1413 96 %     Weight 04/05/18 1414 125 lb (56.7 kg)     Height 04/05/18 1414 '5\' 6"'$  (1.676 m)     Head Circumference --      Peak Flow --      Pain Score 04/05/18 1414 10  Pain Loc --      Pain Edu? --      Excl. in [place]? --     Constitutional: Alert and oriented.  Appears thin and frail.  No distress.  Able to range head neck freely without issue. Eyes: Conjunctivae are normal.  Head: Atraumatic. Nose: No congestion/rhinnorhea. Mouth/Throat: Mucous membranes are moist.  Neck: No stridor.  No meningismus. Cardiovascular: Tachycardic, regular rhythm. Grossly normal heart sounds.  Good peripheral circulation with equal and bilateral dorsalis pedis pulses. Respiratory: Normal respiratory effort.  No retractions. Lungs CTAB. Gastrointestinal: Soft and nontender. No distention. Musculoskeletal: Right foot with amputation site with what appears to be a healing incision.  No erythema, swelling, warmth or induration.  No tenderness to palpation.  However, the left lower extremity is positive for swelling to the small, fourth and third toes with  erythema extending to the ankle.  There is tenderness to palpation associated with warmth and erythema.  Also exudate from between the small and fourth toe without any obvious fluctuance palpated. Neurologic:  Normal speech and language. No gross focal neurologic deficits are appreciated. Skin:  Skin is warm, dry and intact. No rash noted. Psychiatric: Mood and affect are normal. Speech and behavior are normal.  ____________________________________________   LABS (all labs ordered are listed, but only abnormal results are displayed)  Labs Reviewed  COMPREHENSIVE METABOLIC PANEL - Abnormal; Notable for the following components:      Result Value   Sodium 132 (*)    Chloride 95 (*)    Glucose, Bld 478 (*)    BUN 33 (*)    Creatinine, Ser 1.93 (*)    Albumin 2.9 (*)    AST 10 (*)    GFR calc non Af Amer 45 (*)    GFR calc Af Amer 52 (*)    All other components within normal limits  CBC WITH DIFFERENTIAL/PLATELET - Abnormal; Notable for the following components:   WBC 20.8 (*)    RBC 3.33 (*)    Hemoglobin 9.6 (*)    HCT 27.8 (*)    Neutro Abs 18.0 (*)    Lymphs Abs 0.7 (*)    Monocytes Absolute 1.9 (*)    All other components within normal limits  GLUCOSE, CAPILLARY - Abnormal; Notable for the following components:   Glucose-Capillary 448 (*)    All other components within normal limits  CULTURE, BLOOD (ROUTINE X 2)  CULTURE, BLOOD (ROUTINE X 2)  LACTIC ACID, PLASMA  LIPASE, BLOOD  LACTIC ACID, PLASMA  URINALYSIS, COMPLETE (UACMP) WITH MICROSCOPIC  TROPONIN I  BETA-HYDROXYBUTYRIC ACID  BLOOD GAS, VENOUS  URINALYSIS, ROUTINE W REFLEX MICROSCOPIC   ____________________________________________  EKG  ED ECG REPORT I, Doran Stabler, the attending physician, personally viewed and interpreted this ECG.   Date: 04/05/2018  EKG Time: 1434  Rate: 136  Rhythm: sinus tachycardia  Axis: Rightward axis  Intervals:none  ST&T Change: No ST segment elevation or depression.   No abnormal T wave inversion.  ____________________________________________  RADIOLOGY  Pending foot x-ray. ____________________________________________   PROCEDURES  Procedure(s) performed:   .Critical Care Performed by: Orbie Pyo, MD Authorized by: Orbie Pyo, MD   Critical care provider statement:    Critical care time (minutes):  35   Critical care time was exclusive of:  Separately billable procedures and treating other patients   Critical care was necessary to treat or prevent imminent or life-threatening deterioration of the following conditions:  Sepsis   Critical care was time  spent personally by me on the following activities:  Development of treatment plan with patient or surrogate, discussions with consultants, evaluation of patient's response to treatment, examination of patient, obtaining history from patient or surrogate, ordering and performing treatments and interventions, ordering and review of laboratory studies, ordering and review of radiographic studies, pulse oximetry, re-evaluation of patient's condition and review of old charts    Critical Care performed:   ____________________________________________   INITIAL IMPRESSION / [place] / ED COURSE  Pertinent labs & imaging results that were available during my care of the patient were reviewed by me and considered in my medical decision making (see chart for details).  DDX: Myalgia, osteomyelitis, sepsis, cellulitis, migraine headache, headache secondary to sepsis, meningitis As part of my medical decision making, I reviewed the following data within the [facility] Notes from prior ED visits  ----------------------------------------- 3:24 PM on 04/05/2018 -----------------------------------------  Sepsis order set utilized and patient ordered broad-spectrum antibiotics.  Likely sepsis secondary to left lower extremity sialitis with nausea,  vomiting hyperglycemia, renal failure and headache secondary to this process.  Patient aware of need for admission to the hospital.  Signed out to Dr. Darvin Neighbours. ____________________________________________   FINAL CLINICAL IMPRESSION(S) / ED DIAGNOSES  Final diagnoses:  Non-intractable vomiting with nausea, unspecified vomiting type  Hyperglycemia  Sepsis with acute renal failure without septic shock, due to unspecified organism, unspecified acute renal failure type ([place])  Cellulitis of foot      NEW MEDICATIONS STARTED DURING THIS VISIT:  New Prescriptions   No medications on file     Note:  This document was prepared using Dragon voice recognition software and may include unintentional dictation errors.     Orbie Pyo, MD 04/05/18 1525

## 2018-04-06 ENCOUNTER — Other Ambulatory Visit: Payer: Self-pay

## 2018-04-06 ENCOUNTER — Encounter
Admission: EM | Disposition: A | Discharge status: Home Health | Payer: Self-pay | Source: Home / Self Care | Attending: Specialist

## 2018-04-06 ENCOUNTER — Inpatient Hospital Stay: Payer: Medicaid Other | Admitting: Anesthesiology

## 2018-04-06 DIAGNOSIS — E1043 Type 1 diabetes mellitus with diabetic autonomic (poly)neuropathy: Secondary | ICD-10-CM

## 2018-04-06 DIAGNOSIS — K3184 Gastroparesis: Secondary | ICD-10-CM

## 2018-04-06 DIAGNOSIS — E1022 Type 1 diabetes mellitus with diabetic chronic kidney disease: Secondary | ICD-10-CM

## 2018-04-06 DIAGNOSIS — Z91013 Allergy to seafood: Secondary | ICD-10-CM

## 2018-04-06 DIAGNOSIS — N179 Acute kidney failure, unspecified: Secondary | ICD-10-CM

## 2018-04-06 DIAGNOSIS — Z882 Allergy status to sulfonamides status: Secondary | ICD-10-CM

## 2018-04-06 DIAGNOSIS — R1110 Vomiting, unspecified: Secondary | ICD-10-CM

## 2018-04-06 DIAGNOSIS — Z91018 Allergy to other foods: Secondary | ICD-10-CM

## 2018-04-06 DIAGNOSIS — L089 Local infection of the skin and subcutaneous tissue, unspecified: Secondary | ICD-10-CM

## 2018-04-06 DIAGNOSIS — I739 Peripheral vascular disease, unspecified: Secondary | ICD-10-CM

## 2018-04-06 DIAGNOSIS — E10621 Type 1 diabetes mellitus with foot ulcer: Secondary | ICD-10-CM

## 2018-04-06 DIAGNOSIS — L97529 Non-pressure chronic ulcer of other part of left foot with unspecified severity: Secondary | ICD-10-CM

## 2018-04-06 DIAGNOSIS — K589 Irritable bowel syndrome without diarrhea: Secondary | ICD-10-CM

## 2018-04-06 DIAGNOSIS — R109 Unspecified abdominal pain: Secondary | ICD-10-CM

## 2018-04-06 DIAGNOSIS — Z89421 Acquired absence of other right toe(s): Secondary | ICD-10-CM

## 2018-04-06 DIAGNOSIS — Z881 Allergy status to other antibiotic agents status: Secondary | ICD-10-CM

## 2018-04-06 DIAGNOSIS — N189 Chronic kidney disease, unspecified: Secondary | ICD-10-CM

## 2018-04-06 DIAGNOSIS — R197 Diarrhea, unspecified: Secondary | ICD-10-CM

## 2018-04-06 DIAGNOSIS — Z8631 Personal history of diabetic foot ulcer: Secondary | ICD-10-CM

## 2018-04-06 HISTORY — PX: AMPUTATION: SHX166

## 2018-04-06 HISTORY — PX: IRRIGATION AND DEBRIDEMENT FOOT: SHX6602

## 2018-04-06 HISTORY — PX: LOWER EXTREMITY ANGIOGRAPHY: CATH118251

## 2018-04-06 LAB — CBC
HCT: 23.6 % — ABNORMAL LOW (ref 40.0–52.0)
HEMOGLOBIN: 8.1 g/dL — AB (ref 13.0–18.0)
MCH: 28.2 pg (ref 26.0–34.0)
MCHC: 34.2 g/dL (ref 32.0–36.0)
MCV: 82.5 fL (ref 80.0–100.0)
Platelets: 261 10*3/uL (ref 150–440)
RBC: 2.86 MIL/uL — ABNORMAL LOW (ref 4.40–5.90)
RDW: 13.7 % (ref 11.5–14.5)
WBC: 16.9 10*3/uL — ABNORMAL HIGH (ref 3.8–10.6)

## 2018-04-06 LAB — BASIC METABOLIC PANEL
Anion gap: 10 (ref 5–15)
BUN: 29 mg/dL — ABNORMAL HIGH (ref 6–20)
CO2: 24 mmol/L (ref 22–32)
CREATININE: 1.62 mg/dL — AB (ref 0.61–1.24)
Calcium: 8 mg/dL — ABNORMAL LOW (ref 8.9–10.3)
Chloride: 100 mmol/L (ref 98–111)
GFR calc Af Amer: >60 mL/min (ref >60)
GFR calc non Af Amer: 56 mL/min — ABNORMAL LOW (ref >60)
Glucose, Bld: 329 mg/dL — ABNORMAL HIGH (ref 70–99)
POTASSIUM: 3.9 mmol/L (ref 3.5–5.1)
Sodium: 134 mmol/L — ABNORMAL LOW (ref 135–145)

## 2018-04-06 LAB — GLUCOSE, CAPILLARY
GLUCOSE-CAPILLARY: 112 mg/dL — AB (ref 70–99)
GLUCOSE-CAPILLARY: 240 mg/dL — AB (ref 70–99)
GLUCOSE-CAPILLARY: 268 mg/dL — AB (ref 70–99)
Glucose-Capillary: 275 mg/dL — ABNORMAL HIGH (ref 70–99)
Glucose-Capillary: 162 mg/dL — ABNORMAL HIGH (ref 70–99)
Glucose-Capillary: 125 mg/dL — ABNORMAL HIGH (ref 70–99)
Glucose-Capillary: 103 mg/dL — ABNORMAL HIGH (ref 70–99)
Glucose-Capillary: 122 mg/dL — ABNORMAL HIGH (ref 70–99)
Glucose-Capillary: 127 mg/dL — ABNORMAL HIGH (ref 70–99)

## 2018-04-06 SURGERY — LOWER EXTREMITY ANGIOGRAPHY
Anesthesia: Moderate Sedation | Laterality: Left

## 2018-04-06 SURGERY — IRRIGATION AND DEBRIDEMENT FOOT
Anesthesia: General | Laterality: Left

## 2018-04-06 MED ORDER — SODIUM CHLORIDE FLUSH 0.9 % IV SOLN
INTRAVENOUS | Status: AC
  Filled 2018-04-06: qty 20

## 2018-04-06 MED ORDER — SODIUM CHLORIDE 0.9 % IV SOLN: INTRAVENOUS | Status: DC

## 2018-04-06 MED ORDER — ROCURONIUM BROMIDE 50 MG/5ML IV SOLN
INTRAVENOUS | Status: AC
  Filled 2018-04-06: qty 1

## 2018-04-06 MED ORDER — HEPARIN (PORCINE) IN NACL 1000-0.9 UT/500ML-% IV SOLN
INTRAVENOUS | Status: AC
  Filled 2018-04-06: qty 1000

## 2018-04-06 MED ORDER — OXYCODONE-ACETAMINOPHEN 5-325 MG PO TABS
1.0000 | ORAL_TABLET | ORAL | Status: DC | PRN
  Administered 2018-04-07 – 2018-04-11 (×15): 2 via ORAL
  Filled 2018-04-06 (×11): qty 2
  Filled 2018-04-06: qty 1
  Filled 2018-04-06 (×2): qty 2
  Filled 2018-04-06: qty 1
  Filled 2018-04-06: qty 2

## 2018-04-06 MED ORDER — VANCOMYCIN HCL 1000 MG IV SOLR
INTRAVENOUS | Status: DC | PRN
  Administered 2018-04-06: 1000 mg

## 2018-04-06 MED ORDER — ROCURONIUM BROMIDE 100 MG/10ML IV SOLN
INTRAVENOUS | Status: DC | PRN
  Administered 2018-04-06: 5 mg via INTRAVENOUS

## 2018-04-06 MED ORDER — DEXMEDETOMIDINE HCL 200 MCG/2ML IV SOLN
INTRAVENOUS | Status: DC | PRN
  Administered 2018-04-06 (×2): 4 ug via INTRAVENOUS
  Administered 2018-04-06: 8 ug via INTRAVENOUS

## 2018-04-06 MED ORDER — DEXMEDETOMIDINE HCL IN NACL 200 MCG/50ML IV SOLN
INTRAVENOUS | Status: AC
  Filled 2018-04-06: qty 50

## 2018-04-06 MED ORDER — LIDOCAINE-EPINEPHRINE (PF) 1 %-1:200000 IJ SOLN
INTRAMUSCULAR | Status: AC
  Filled 2018-04-06: qty 30

## 2018-04-06 MED ORDER — ROPIVACAINE HCL 5 MG/ML IJ SOLN
INTRAMUSCULAR | Status: DC | PRN
  Administered 2018-04-06: 30 mL via PERINEURAL

## 2018-04-06 MED ORDER — FENTANYL CITRATE (PF) 100 MCG/2ML IJ SOLN: 25.0000 ug | INTRAMUSCULAR | Status: DC | PRN

## 2018-04-06 MED ORDER — ACETAMINOPHEN 10 MG/ML IV SOLN
INTRAVENOUS | Status: AC
  Filled 2018-04-06: qty 100

## 2018-04-06 MED ORDER — LIDOCAINE HCL (CARDIAC) PF 100 MG/5ML IV SOSY
PREFILLED_SYRINGE | INTRAVENOUS | Status: DC | PRN
  Administered 2018-04-06: 100 mg via INTRAVENOUS

## 2018-04-06 MED ORDER — DEXAMETHASONE SODIUM PHOSPHATE 10 MG/ML IJ SOLN
INTRAMUSCULAR | Status: DC | PRN
  Administered 2018-04-06: 5 mg via INTRAVENOUS

## 2018-04-06 MED ORDER — MORPHINE SULFATE (PF) 2 MG/ML IV SOLN
INTRAVENOUS | Status: AC
  Filled 2018-04-06: qty 1

## 2018-04-06 MED ORDER — FENTANYL CITRATE (PF) 100 MCG/2ML IJ SOLN
INTRAMUSCULAR | Status: AC
  Filled 2018-04-06: qty 2

## 2018-04-06 MED ORDER — ONDANSETRON HCL 4 MG/2ML IJ SOLN
INTRAMUSCULAR | Status: AC
  Filled 2018-04-06: qty 2

## 2018-04-06 MED ORDER — DEXTROSE-NACL 5-0.45 % IV SOLN
INTRAVENOUS | Status: AC
  Administered 2018-04-06: 15:00:00 via INTRAVENOUS

## 2018-04-06 MED ORDER — PROPOFOL 10 MG/ML IV BOLUS
INTRAVENOUS | Status: DC | PRN
  Administered 2018-04-06: 170 mg via INTRAVENOUS

## 2018-04-06 MED ORDER — MIDAZOLAM HCL 2 MG/2ML IJ SOLN
1.0000 mg | Freq: Once | INTRAMUSCULAR | Status: AC
  Administered 2018-04-06: 1 mg via INTRAVENOUS

## 2018-04-06 MED ORDER — IOPAMIDOL (ISOVUE-300) INJECTION 61%
INTRAVENOUS | Status: DC | PRN
  Administered 2018-04-06: 40 mL via INTRA_ARTERIAL

## 2018-04-06 MED ORDER — HEPARIN SODIUM (PORCINE) 1000 UNIT/ML IJ SOLN
INTRAMUSCULAR | Status: AC
  Filled 2018-04-06: qty 1

## 2018-04-06 MED ORDER — EPINEPHRINE PF 1 MG/ML IJ SOLN
INTRAMUSCULAR | Status: AC
  Filled 2018-04-06: qty 1

## 2018-04-06 MED ORDER — DEXAMETHASONE SODIUM PHOSPHATE 10 MG/ML IJ SOLN
INTRAMUSCULAR | Status: AC
  Filled 2018-04-06: qty 1

## 2018-04-06 MED ORDER — LORAZEPAM 2 MG/ML IJ SOLN
0.5000 mg | Freq: Four times a day (QID) | INTRAMUSCULAR | Status: DC | PRN
  Administered 2018-04-06 (×2): 0.5 mg via INTRAVENOUS
  Filled 2018-04-06 (×2): qty 1

## 2018-04-06 MED ORDER — INSULIN ASPART 100 UNIT/ML ~~LOC~~ SOLN
0.0000 [IU] | Freq: Every day | SUBCUTANEOUS | Status: DC
  Administered 2018-04-06: 3 [IU] via SUBCUTANEOUS
  Administered 2018-04-09: 0.3 [IU] via SUBCUTANEOUS
  Filled 2018-04-06 (×2): qty 1

## 2018-04-06 MED ORDER — FAMOTIDINE 20 MG PO TABS
ORAL_TABLET | ORAL | Status: AC
  Filled 2018-04-06: qty 2

## 2018-04-06 MED ORDER — FENTANYL CITRATE (PF) 100 MCG/2ML IJ SOLN
INTRAMUSCULAR | Status: DC | PRN
  Administered 2018-04-06 (×2): 25 ug via INTRAVENOUS
  Administered 2018-04-06: 50 ug via INTRAVENOUS

## 2018-04-06 MED ORDER — DIPHENHYDRAMINE HCL 50 MG/ML IJ SOLN
25.0000 mg | Freq: Once | INTRAMUSCULAR | Status: AC
  Administered 2018-04-06: 25 mg via INTRAVENOUS

## 2018-04-06 MED ORDER — METHYLPREDNISOLONE SODIUM SUCC 125 MG IJ SOLR
INTRAMUSCULAR | Status: AC
  Filled 2018-04-06: qty 2

## 2018-04-06 MED ORDER — FAMOTIDINE 20 MG PO TABS
40.0000 mg | ORAL_TABLET | Freq: Once | ORAL | Status: AC
  Administered 2018-04-06 (×2): 20 mg via ORAL

## 2018-04-06 MED ORDER — SUCCINYLCHOLINE CHLORIDE 20 MG/ML IJ SOLN
INTRAMUSCULAR | Status: AC
  Filled 2018-04-06: qty 1

## 2018-04-06 MED ORDER — FENTANYL CITRATE (PF) 100 MCG/2ML IJ SOLN
INTRAMUSCULAR | Status: AC
  Administered 2018-04-06: 50 ug via INTRAMUSCULAR
  Filled 2018-04-06: qty 2

## 2018-04-06 MED ORDER — BUPIVACAINE HCL 0.5 % IJ SOLN
INTRAMUSCULAR | Status: DC | PRN
  Administered 2018-04-06: 10 mL

## 2018-04-06 MED ORDER — METHYLPREDNISOLONE SODIUM SUCC 125 MG IJ SOLR
125.0000 mg | Freq: Once | INTRAMUSCULAR | Status: AC
  Administered 2018-04-06: 125 mg via INTRAVENOUS

## 2018-04-06 MED ORDER — LIDOCAINE HCL (PF) 1 % IJ SOLN
INTRAMUSCULAR | Status: DC | PRN
  Administered 2018-04-06: .8 mL via SUBCUTANEOUS

## 2018-04-06 MED ORDER — MIDAZOLAM HCL 5 MG/5ML IJ SOLN
INTRAMUSCULAR | Status: AC
  Filled 2018-04-06: qty 5

## 2018-04-06 MED ORDER — INSULIN ASPART 100 UNIT/ML ~~LOC~~ SOLN
SUBCUTANEOUS | Status: AC
  Filled 2018-04-06: qty 1

## 2018-04-06 MED ORDER — MIDAZOLAM HCL 2 MG/2ML IJ SOLN
INTRAMUSCULAR | Status: AC
  Filled 2018-04-06: qty 2

## 2018-04-06 MED ORDER — ONDANSETRON HCL 4 MG/2ML IJ SOLN
INTRAMUSCULAR | Status: DC | PRN
  Administered 2018-04-06: 4 mg via INTRAVENOUS

## 2018-04-06 MED ORDER — VANCOMYCIN HCL IN DEXTROSE 1-5 GM/200ML-% IV SOLN
1000.0000 mg | INTRAVENOUS | Status: DC
  Filled 2018-04-06: qty 200

## 2018-04-06 MED ORDER — MIDAZOLAM HCL 2 MG/2ML IJ SOLN
INTRAMUSCULAR | Status: AC
  Administered 2018-04-06: 1 mg via INTRAVENOUS
  Filled 2018-04-06: qty 2

## 2018-04-06 MED ORDER — ACETAMINOPHEN 10 MG/ML IV SOLN
INTRAVENOUS | Status: DC | PRN
  Administered 2018-04-06: 1000 mg via INTRAVENOUS

## 2018-04-06 MED ORDER — MIDAZOLAM HCL 2 MG/2ML IJ SOLN
INTRAMUSCULAR | Status: DC | PRN
  Administered 2018-04-06: 1 mg via INTRAVENOUS
  Administered 2018-04-06: 2 mg via INTRAVENOUS

## 2018-04-06 MED ORDER — INSULIN GLARGINE 100 UNIT/ML ~~LOC~~ SOLN
35.0000 [IU] | Freq: Every day | SUBCUTANEOUS | Status: DC
  Filled 2018-04-06: qty 0.35

## 2018-04-06 MED ORDER — LIDOCAINE HCL (PF) 1 % IJ SOLN
INTRAMUSCULAR | Status: AC
  Filled 2018-04-06: qty 5

## 2018-04-06 MED ORDER — FENTANYL CITRATE (PF) 100 MCG/2ML IJ SOLN
INTRAMUSCULAR | Status: DC | PRN
  Administered 2018-04-06: 50 ug via INTRAVENOUS
  Administered 2018-04-06: 25 ug via INTRAVENOUS
  Administered 2018-04-06: 50 ug via INTRAMUSCULAR

## 2018-04-06 MED ORDER — MIDAZOLAM HCL 5 MG/5ML IJ SOLN
INTRAMUSCULAR | Status: DC | PRN
  Administered 2018-04-06: 2 mg via INTRAVENOUS

## 2018-04-06 MED ORDER — DIPHENHYDRAMINE HCL 50 MG/ML IJ SOLN
INTRAMUSCULAR | Status: AC
  Filled 2018-04-06: qty 1

## 2018-04-06 MED ORDER — SUCCINYLCHOLINE CHLORIDE 20 MG/ML IJ SOLN
INTRAMUSCULAR | Status: DC | PRN
  Administered 2018-04-06: 140 mg via INTRAVENOUS

## 2018-04-06 MED ORDER — PROPOFOL 10 MG/ML IV BOLUS
INTRAVENOUS | Status: AC
  Filled 2018-04-06: qty 20

## 2018-04-06 MED ORDER — SODIUM CHLORIDE 0.9 % IV SOLN
1.0000 g | Freq: Three times a day (TID) | INTRAVENOUS | Status: DC
  Administered 2018-04-06 – 2018-04-08 (×6): 1 g via INTRAVENOUS
  Filled 2018-04-06 (×9): qty 1

## 2018-04-06 MED ORDER — LORAZEPAM 2 MG/ML IJ SOLN: 0.5000 mg | Freq: Once | INTRAMUSCULAR | Status: DC

## 2018-04-06 MED ORDER — ROPIVACAINE HCL 5 MG/ML IJ SOLN
INTRAMUSCULAR | Status: AC
  Filled 2018-04-06: qty 30

## 2018-04-06 MED ORDER — ONDANSETRON HCL 4 MG/2ML IJ SOLN: 4.0000 mg | Freq: Once | INTRAMUSCULAR | Status: DC | PRN

## 2018-04-06 MED ORDER — FENTANYL CITRATE (PF) 100 MCG/2ML IJ SOLN: 50.0000 ug | Freq: Once | INTRAMUSCULAR | Status: DC

## 2018-04-06 SURGICAL SUPPLY — 9 items
CATH PIG 70CM (CATHETERS) ×3 IMPLANT
DEVICE STARCLOSE SE CLOSURE (Vascular Products) ×3 IMPLANT
DEVICE TORQUE .025-.038 (MISCELLANEOUS) ×3 IMPLANT
GLIDEWIRE STIFF .35X180X3 HYDR (WIRE) ×3 IMPLANT
PACK ANGIOGRAPHY (CUSTOM PROCEDURE TRAY) ×3 IMPLANT
SHEATH BRITE TIP 5FRX11 (SHEATH) ×3 IMPLANT
SYR MEDRAD MARK V 150ML (SYRINGE) ×3 IMPLANT
TUBING CONTRAST HIGH PRESS 72 (TUBING) ×3 IMPLANT
WIRE J 3MM .035X145CM (WIRE) ×3 IMPLANT

## 2018-04-06 SURGICAL SUPPLY — 62 items
"PENCIL ELECTRO HAND CTR " (MISCELLANEOUS) ×1 IMPLANT
BANDAGE ACE 4X5 VEL STRL LF (GAUZE/BANDAGES/DRESSINGS) ×1 IMPLANT
BANDAGE ELASTIC 4 LF NS (GAUZE/BANDAGES/DRESSINGS) ×3 IMPLANT
BLADE MED AGGRESSIVE (BLADE) ×3 IMPLANT
BLADE OSC/SAGITTAL MD 5.5X18 (BLADE) ×3 IMPLANT
BLADE OSCILLATING/SAGITTAL (BLADE)
BLADE SURG 15 STRL LF DISP TIS (BLADE) ×2 IMPLANT
BLADE SURG 15 STRL SS (BLADE) ×4
BLADE SURG MINI STRL (BLADE) ×3 IMPLANT
BLADE SW THK.38XMED LNG THN (BLADE) IMPLANT
BNDG ESMARK 4X12 TAN STRL LF (GAUZE/BANDAGES/DRESSINGS) ×3 IMPLANT
BNDG GAUZE 4.5X4.1 6PLY STRL (MISCELLANEOUS) ×5 IMPLANT
CANISTER SUCT 1200ML W/VALVE (MISCELLANEOUS) ×3 IMPLANT
CLOSURE WOUND 1/4X4 (GAUZE/BANDAGES/DRESSINGS)
CUFF TOURN 18 STER (MISCELLANEOUS) ×1 IMPLANT
CUFF TOURN DUAL PL 12 NO SLV (MISCELLANEOUS) ×3 IMPLANT
DRAPE FLUOR MINI C-ARM 54X84 (DRAPES) ×1 IMPLANT
DRESSING SURGICEL FIBRLLR 1X2 (HEMOSTASIS) IMPLANT
DRSG SURGICEL FIBRILLAR 1X2 (HEMOSTASIS) ×3
DURAPREP 26ML APPLICATOR (WOUND CARE) ×3 IMPLANT
ELECT REM PT RETURN 9FT ADLT (ELECTROSURGICAL) ×3
ELECTRODE REM PT RTRN 9FT ADLT (ELECTROSURGICAL) ×1 IMPLANT
GAUZE PETRO XEROFOAM 1X8 (MISCELLANEOUS) ×3 IMPLANT
GAUZE SPONGE 4X4 12PLY STRL (GAUZE/BANDAGES/DRESSINGS) ×3 IMPLANT
GAUZE STRETCH 2X75IN STRL (MISCELLANEOUS) ×1 IMPLANT
GLOVE BIO SURGEON STRL SZ7.5 (GLOVE) ×3 IMPLANT
GLOVE INDICATOR 8.0 STRL GRN (GLOVE) ×3 IMPLANT
GOWN STRL REUS W/ TWL LRG LVL3 (GOWN DISPOSABLE) ×2 IMPLANT
GOWN STRL REUS W/TWL LRG LVL3 (GOWN DISPOSABLE) ×4
HANDPIECE VERSAJET DEBRIDEMENT (MISCELLANEOUS) ×3 IMPLANT
KIT STIMULAN RAPID CURE 5CC (Orthopedic Implant) ×2 IMPLANT
KIT TURNOVER KIT A (KITS) ×3 IMPLANT
LABEL OR SOLS (LABEL) ×3 IMPLANT
NDL FILTER BLUNT 18X1 1/2 (NEEDLE) ×1 IMPLANT
NDL HYPO 25X1 1.5 SAFETY (NEEDLE) ×3 IMPLANT
NEEDLE FILTER BLUNT 18X 1/2SAF (NEEDLE) ×2
NEEDLE FILTER BLUNT 18X1 1/2 (NEEDLE) ×1 IMPLANT
NEEDLE HYPO 25X1 1.5 SAFETY (NEEDLE) ×9 IMPLANT
NS IRRIG 500ML POUR BTL (IV SOLUTION) ×3 IMPLANT
PACK EXTREMITY ARMC (MISCELLANEOUS) ×3 IMPLANT
PAD ABD DERMACEA PRESS 5X9 (GAUZE/BANDAGES/DRESSINGS) ×6 IMPLANT
PENCIL ELECTRO HAND CTR (MISCELLANEOUS) ×3 IMPLANT
RASP SM TEAR CROSS CUT (RASP) IMPLANT
SOL .9 NS 3000ML IRR  AL (IV SOLUTION) ×2
SOL .9 NS 3000ML IRR UROMATIC (IV SOLUTION) ×1 IMPLANT
SOL PREP PVP 2OZ (MISCELLANEOUS) ×3
SOLUTION PREP PVP 2OZ (MISCELLANEOUS) ×1 IMPLANT
STOCKINETTE 48X4 2 PLY STRL (GAUZE/BANDAGES/DRESSINGS) ×1 IMPLANT
STOCKINETTE STRL 4IN 9604848 (GAUZE/BANDAGES/DRESSINGS) IMPLANT
STOCKINETTE STRL 6IN 960660 (GAUZE/BANDAGES/DRESSINGS) ×3 IMPLANT
STRIP CLOSURE SKIN 1/4X4 (GAUZE/BANDAGES/DRESSINGS) ×1 IMPLANT
SUT ETHILON 3-0 FS-10 30 BLK (SUTURE) ×3
SUT ETHILON 4-0 (SUTURE) ×2
SUT ETHILON 4-0 FS2 18XMFL BLK (SUTURE) ×1
SUT VIC AB 3-0 SH 27 (SUTURE) ×2
SUT VIC AB 3-0 SH 27X BRD (SUTURE) ×1 IMPLANT
SUT VIC AB 4-0 FS2 27 (SUTURE) ×3 IMPLANT
SUTURE EHLN 3-0 FS-10 30 BLK (SUTURE) ×1 IMPLANT
SUTURE ETHLN 4-0 FS2 18XMF BLK (SUTURE) ×1 IMPLANT
SWAB DUAL CULTURE TRANS RED ST (MISCELLANEOUS) ×3 IMPLANT
SYR 10ML LL (SYRINGE) ×6 IMPLANT
SYR 3ML LL SCALE MARK (SYRINGE) ×3 IMPLANT

## 2018-04-06 NOTE — Anesthesia Preprocedure Evaluation (Addendum)
Anesthesia Evaluation  Patient identified by MRN, date of birth, ID band Patient awake    Reviewed: Allergy & Precautions, NPO status , Patient's Chart, lab work & pertinent test results  History of Anesthesia Complications Negative for: history of anesthetic complications  Airway Mallampati: II       Dental  (+) Missing, Loose, Chipped, Poor Dentition   Pulmonary neg sleep apnea, neg COPD,           Cardiovascular hypertension, Pt. on medications (-) Past MI and (-) CHF (-) dysrhythmias (-) Valvular Problems/Murmurs     Neuro/Psych neg Seizures PSYCHIATRIC DISORDERS Depression    GI/Hepatic Neg liver ROS, GERD  Medicated and Poorly Controlled,  Endo/Other  diabetes, Type 1, Insulin Dependent  Renal/GU negative Renal ROS     Musculoskeletal   Abdominal   Peds  Hematology   Anesthesia Other Findings   Reproductive/Obstetrics                           Anesthesia Physical Anesthesia Plan  ASA: III and emergent  Anesthesia Plan: General   Post-op Pain Management: GA combined w/ Regional for post-op pain   Induction: Rapid sequence and Cricoid pressure planned  PONV Risk Score and Plan: 2 and Dexamethasone and Ondansetron  Airway Management Planned: Oral ETT  Additional Equipment:   Intra-op Plan:   Post-operative Plan:   Informed Consent: I have reviewed the patients History and Physical, chart, labs and discussed the procedure including the risks, benefits and alternatives for the proposed anesthesia with the patient or authorized representative who has indicated his/her understanding and acceptance.     Plan Discussed with:   Anesthesia Plan Comments:         Anesthesia Quick Evaluation

## 2018-04-06 NOTE — Consult Note (Signed)
NAME: Shawn [name]  DOB: 12-16-1985  MRN: [id number]  Date/Time: 04/06/2018 5:15 PM Subjective:  REASON FOR CONSULT: Diabetic foot infection ? Shawn [name] is a 32 y.o. male with a history of type 1 diabetes, gastroparesis, IBS, infection of the right foot status post [name] excision of fourth and fifth toes in June 2019 is admitted because of fever and chills of 2 days and infection of the left foot.  He has had multiple admissions since May 2019.  First admission between May 8 to Nov 11, 2017 for diabetic ketoacidosis.  Then he was readmitted in June between 12/21/2017 until 6/25 2019  for cellulitis and abscess of the right foot and underwent on December 20, 2017 debridement of the infection.  On December 23, 2017 he had an abdominal aortogram and it was found that at the fourth and fifth rays had very poor flow digital vessels.  On December 24, 2017 he underwent amputation of the right fourth and fifth toes with partial [name] resections.  Wound culture was positive for staph aureus.  While in the hospital he was getting vancomycin.  And then he was switched to doxycycline orally for 2 more weeks.  He also had wound VAC to the right foot surgical site.  He was back again in June and was admitted between December 29, 2017 until 01/03/2018 and underwent on January 01, 2018 removal and excisional debridement of necrotic skin soft tissue tendons and fifth metatarsal bone.  He was initially treated with vancomycin aztreonam and Flagyl which was changed to doxycycline and ciprofloxacin and to continue 2 weeks on discharge.  Was in the hospital for 24-hour period Twice after that.  Had multiple visits to the ED.  He was followed by podiatrist as outpatient. He states that the wound on the right leg finally closed.  But then he started having nausea vomiting diarrhea for 2 days with headache.  He also had fever and chills.  And he also noted that his left foot which initially had a small blister/callus on the plantar aspect later  was getting swollen and red.  Came to the hospital because of that.  On admission he did not have a temperature but had a fever of 101.7 early this morning.  He is on vancomycin and aztreonam.  Today he underwent angiogram of the left leg and was found to have .  Patient states he has had allergy to Keflex causing rash and itching.  He has taken penicillin in the past without any problem.     Past Medical History:  Diagnosis Date  . Diabetes mellitus without complication ([place])   . GERD (gastroesophageal reflux disease)   . IBS (irritable bowel syndrome)        Family History  Problem Relation Age of Onset  . Diabetes Mother   . Ovarian cancer Mother    Allergies  Allergen Reactions  . Banana Hives, Nausea And Vomiting and Rash  . Keflex [Cephalexin] Anaphylaxis  . Onion Hives, Nausea And Vomiting and Rash  . Sulfa Antibiotics Anaphylaxis  . Grapeseed Extract [Nutritional Supplements] Itching  . Shellfish Allergy Hives    "ALL SEAFOOD"   ?Social history Lives with his dad Used to work at [employer] but has not been working since May 2019.  Current Facility-Administered Medications  Medication Dose Route Frequency Provider Last Rate Last Dose  . acetaminophen (TYLENOL) tablet 650 mg  650 mg Oral Q6H PRN Algernon Huxley, MD   650 mg at 04/05/18 1826  Or  . acetaminophen (TYLENOL) suppository 650 mg  650 mg Rectal Q6H PRN Algernon Huxley, MD   650 mg at 04/06/18 0759  . ALPRAZolam (XANAX) tablet 0.25 mg  0.25 mg Oral TID PRN Algernon Huxley, MD      . aztreonam (AZACTAM) 2 g in sodium chloride 0.9 % 100 mL IVPB  2 g Intravenous Q8H Algernon Huxley, MD   Stopped at 04/06/18 1606  . dextrose 5 %-0.45 % sodium chloride infusion   Intravenous Continuous Gladstone Lighter, MD 75 mL/hr at 04/06/18 1521    . diphenhydrAMINE (BENADRYL) 50 MG/ML injection           . enoxaparin (LOVENOX) injection 40 mg  40 mg Subcutaneous Q24H Dew, Erskine Squibb, MD      . EPINEPHrine (ADRENALIN) 1 MG/ML           .  famotidine (PEPCID) 20 MG tablet           . fentaNYL (SUBLIMAZE) 100 MCG/2ML injection           . ferrous sulfate tablet 325 mg  325 mg Oral BID WC Algernon Huxley, MD   325 mg at 04/05/18 1826  . FLUoxetine (PROZAC) capsule 20 mg  20 mg Oral Daily Algernon Huxley, MD   20 mg at 04/05/18 1826  . insulin aspart (novoLOG) injection 0-9 Units  0-9 Units Subcutaneous TID WC Algernon Huxley, MD   5 Units at 04/06/18 0759  . [START ON 04/07/2018] insulin glargine (LANTUS) injection 35 Units  35 Units Subcutaneous Daily Dew, Erskine Squibb, MD      . lidocaine (PF) (XYLOCAINE) 1 % injection           . lisinopril (PRINIVIL,ZESTRIL) tablet 10 mg  10 mg Oral Daily Algernon Huxley, MD   10 mg at 04/05/18 1809  . LORazepam (ATIVAN) injection 0.5 mg  0.5 mg Intravenous Q6H PRN Algernon Huxley, MD   0.5 mg at 04/06/18 1520  . methylPREDNISolone sodium succinate (SOLU-MEDROL) 125 mg/2 mL injection           . metoCLOPramide (REGLAN) tablet 10 mg  10 mg Oral TID WC Dew, Erskine Squibb, MD      . midazolam (VERSED) 2 MG/2ML injection           . morphine 2 MG/ML injection 2 mg  2 mg Intravenous Q4H PRN Algernon Huxley, MD   2 mg at 04/06/18 1522  . morphine 2 MG/ML injection           . ondansetron (ZOFRAN) tablet 4 mg  4 mg Oral Q6H PRN Algernon Huxley, MD       Or  . ondansetron (ZOFRAN) injection 4 mg  4 mg Intravenous Q6H PRN Algernon Huxley, MD   4 mg at 04/06/18 1520  . oxyCODONE-acetaminophen (PERCOCET/ROXICET) 5-325 MG per tablet 1 tablet  1 tablet Oral Q4H PRN Algernon Huxley, MD   1 tablet at 04/05/18 2310  . promethazine (PHENERGAN) tablet 12.5 mg  12.5 mg Oral Q8H PRN Algernon Huxley, MD   12.5 mg at 04/06/18 0346  . ropivacaine (PF) 5 mg/mL (0.5%) (NAROPIN) 5 MG/ML injection           . vancomycin (VANCOCIN) IVPB 1000 mg/200 mL premix  1,000 mg Intravenous Q24H Algernon Huxley, MD 200 mL/hr at 04/06/18 0313 1,000 mg at 04/06/18 0313    REVIEW OF SYSTEMS:  Const:  fever,  chills,  weight loss  Eyes: negative diplopia or visual changes,  negative eye pain ENT: negative coryza, negative sore throat Resp: negative cough, hemoptysis, dyspnea Cards: negative for chest pain, palpitations, lower extremity edema GU: negative for frequency, dysuria and hematuria GI: has abdominal pain, diarrhea, vomiting No bleeding, constipation Skin: negative for rash and pruritus Heme: negative for easy bruising and gum/nose bleeding MS:  muscle weakness Neurolo:negative for headaches, dizziness, vertigo, memory problems  Psych: Depressed as he is not able to work  endocrine: Urea and polydipsia Allergy/Immunology-allergies to medication noted as above ?  Objective:  VITALS:  BP 129/86 (BP Location: Right Arm)   Pulse (!) 107   Temp 99.5 F (37.5 C) (Oral)   Resp 18   Ht 5\' 6"  (1.676 m)   Wt 56.7 kg   SpO2 100%   BMI 20.18 kg/m  PHYSICAL EXAM:  General: Alert, cooperative, no distress, pale , depressed Head: Normocephalic, without obvious abnormality, atraumatic. Eyes: Conjunctivae clear, anicteric sclerae. Pupils are equal ENT Nares normal. No drainage or sinus tenderness. Lips, mucosa, and tongue normal. No Thrush Neck: Supple, symmetrical, no adenopathy, thyroid: non tender no carotid bruit and no JVD. Back: No CVA tenderness. Lungs: Clear to auscultation bilaterally. No Wheezing or Rhonchi. No rales. Heart: Regular rate and rhythm, no murmur, rub or gallop. Abdomen: Soft, non-tender,not distended. Bowel sounds normal. No masses Extremities: rt foot 4th and 5th [name] excision- wound closed- has a scab   Left foot Swollen, infected necrotic tissue over the lateral aspect around the fourth and fifth toes on the dorsal aspect.  On the plantar surface of the corresponding site there is an ulcer       Skin: No rashes or lesions. Or bruising Lymph: Cervical, supraclavicular normal. Neurologic: Grossly non-focal Pertinent Labs  CBC Latest Ref Rng & Units 04/06/2018 04/05/2018 04/05/2018  WBC 3.8 - 10.6 K/uL 16.9(H) 18.7(H)  20.8(H)  Hemoglobin 13.0 - 18.0 g/dL 8.1(L) 9.2(L) 9.6(L)  Hematocrit 40.0 - 52.0 % 23.6(L) 27.1(L) 27.8(L)  Platelets 150 - 440 K/uL 261 308 323    CMP Latest Ref Rng & Units 04/06/2018 04/05/2018 04/05/2018  Glucose 70 - 99 mg/dL 329(H) - 478(H)  BUN 6 - 20 mg/dL 29(H) - 33(H)  Creatinine 0.61 - 1.24 mg/dL 1.62(H) 1.63(H) 1.93(H)  Sodium 135 - 145 mmol/L 134(L) - 132(L)  Potassium 3.5 - 5.1 mmol/L 3.9 - 4.4  Chloride 98 - 111 mmol/L 100 - 95(L)  CO2 22 - 32 mmol/L 24 - 22  Calcium 8.9 - 10.3 mg/dL 8.0(L) - 9.0  Total Protein 6.5 - 8.1 g/dL - - 7.3  Total Bilirubin 0.3 - 1.2 mg/dL - - 0.9  Alkaline Phos 38 - 126 U/L - - 93  AST 15 - 41 U/L - - 10(L)  ALT 0 - 44 U/L - - 9   IMAGING RESULTS: Findings consistent with osteomyelitis throughout the proximal phalanx of the second toe and at least the distal 2.5 cm of the fifth metatarsal. Milder degree of edema throughout the remainder of the fifth metatarsal diaphysis could be due to osteomyelitis or reactive change.  Nondisplaced fracture of the base the proximal phalanx of the fifth toe is likely pathologic and related to infection.  Skin ulceration subjacent to the fifth MTP joint without underlying Abscess.?  Impression and recommendation 32 y.o. male with a history of type 1 diabetes, gastroparesis, IBS, infection of the right foot status post [name] excision of fourth and fifth toes in June 2019 is admitted because of fever and chills of  2 days and infection of the left foot.   Diabetic foot infection of the left side.  MRI shows osteomyelitis of the fifth toe.  Seen by podiatrist and planning for surgery.  He is currently on vancomycin and aztreonam. ( change the latter to meropenem for better anaerobic and gram neg coverage)  . Discussed with podiatrist and cultures will be sent during surgery.  Bone will also be sent for proximal clearance.  History of Keflex allergy.  Has rash with it.  No swelling.  He states he can take  penicillin.  So the meropenem can be changed to Zosyn once cultures are obtained and MRSA is ruled out.  Currently do not want to give Vanco and Zosyn combination because of AKI on CKD.  Type 1 diabetes mellitus uncontrolled.  Management as per primary team.   Diabetic gastroparesis   Acute renal failure on CKD.  Avoid nephrotoxins  History of right diabetic foot infection status post amputation of the fourth and fifth toes.  Wound has healed pretty well. ______________________________________________ Discussed with patient and Podiatrist Will see the patient again  on saturday

## 2018-04-06 NOTE — Anesthesia Procedure Notes (Signed)
Anesthesia Regional Block: Popliteal block   Pre-Anesthetic Checklist: ,, timeout performed, Correct Patient, Correct Site, Correct Laterality, Correct Procedure, Correct Position, site marked, Risks and benefits discussed,  Surgical consent,  Pre-op evaluation,  At surgeon's request and post-op pain management  Laterality: Lower and Left  Prep: chloraprep       Needles:  Injection technique: Single-shot  Needle Type: Echogenic Stimulator Needle     Needle Length: 9cm  Needle Gauge: 21     Additional Needles:   Procedures:,,,, ultrasound used (permanent image in chart),,,,  Narrative:  Start time: 04/06/2018 5:50 PM End time: 04/06/2018 5:54 PM Injection made incrementally with aspirations every 5 mL.  Performed by: Personally  Anesthesiologist: Piscitello, Precious Haws, MD  Additional Notes: Functioning IV was confirmed and monitors were applied.  A echogenic needle was used. Sterile prep,hand hygiene and sterile gloves were used. Minimal sedation used for procedure.   No paresthesia endorsed by patient during the procedure.  Negative aspiration and negative test dose prior to incremental administration of local anesthetic. The patient tolerated the procedure well with no immediate complications.

## 2018-04-06 NOTE — Anesthesia Postprocedure Evaluation (Signed)
Anesthesia Post Note  Patient: Shawn [name]  Procedure(s) Performed: IRRIGATION AND DEBRIDEMENT FOOT (Left ) AMPUTATION RAY (Left )  Patient location during evaluation: PACU Anesthesia Type: General Level of consciousness: awake and alert Pain management: pain level controlled Vital Signs Assessment: post-procedure vital signs reviewed and stable Respiratory status: spontaneous breathing and respiratory function stable Cardiovascular status: stable Anesthetic complications: no     Last Vitals:  Vitals:   04/06/18 1935 04/06/18 1940  BP: (!) 142/90   Pulse: 100 (!) 107  Resp: 18 16  Temp:    SpO2: 100% 100%    Last Pain:  Vitals:   04/06/18 1945  TempSrc:   PainSc: 0-No pain                 Aja Whitehair K

## 2018-04-06 NOTE — Op Note (Signed)
[name]  Percutaneous Study/Intervention Procedural Note   Date of Surgery: 04/06/2018  Surgeon(s):Karem Tomaso    Assistants:none  Pre-operative Diagnosis: PAD with ulceration left foot  Post-operative diagnosis:  Same  Procedure(s) Performed:             1.  Ultrasound guidance for vascular access right femoral artery             2.  Catheter placement into left SFA from right femoral approach             3.  Aortogram and selective left lower extremity angiogram             4.  StarClose closure device right femoral artery  EBL: 3 cc  Contrast: 40 cc  Fluoro Time: 1.6 minutes  Moderate Conscious Sedation Time: approximately 15 minutes using 3 mg of Versed and 75 mcg of Fentanyl              Indications:  Patient is a 32 y.o.male with non-healing ulceration and infection on the toes of the left foot. The patient is brought in for angiography for further evaluation and potential treatment.  Due to the limb threatening nature of the situation, angiogram was performed for attempted limb salvage. The patient is aware that if the procedure fails, amputation would be expected.  The patient also understands that even with successful revascularization, amputation may still be required due to the severity of the situation.  Risks and benefits are discussed and informed consent is obtained.   Procedure:  The patient was identified and appropriate procedural time out was performed.  The patient was then placed supine on the table and prepped and draped in the usual sterile fashion. Moderate conscious sedation was administered during a face to face encounter with the patient throughout the procedure with my supervision of the RN administering medicines and monitoring the patient's vital signs, pulse oximetry, telemetry and mental status throughout from the start of the procedure until the patient was taken to the recovery room. Ultrasound was used to evaluate the right  common femoral artery.  It was patent .  A digital ultrasound image was acquired.  A Seldinger needle was used to access the right common femoral artery under direct ultrasound guidance and a permanent image was performed.  A 0.035 J wire was advanced without resistance and a 5Fr sheath was placed.  Pigtail catheter was placed into the aorta and an AP aortogram was performed. This demonstrated normal renal arteries and normal aorta and iliac segments without significant stenosis. I then crossed the aortic bifurcation and advanced to the left proximal to mid SFA. Selective left lower extremity angiogram was then performed. This demonstrated normal common femoral artery, profunda femoris artery, superficial femoral artery, and popliteal artery.  The anterior tibial artery was in the dominant runoff into the foot was continuous all the way into the foot providing good blood flow to the metatarsals.  The posterior tibial artery was also patent to the foot although smaller.  No intervention was required and his blood flow should be adequate for wound healing of any procedure.  I elected to terminate the procedure. The sheath was removed and StarClose closure device was deployed in the right femoral artery with excellent hemostatic result. The patient was taken to the recovery room in stable condition having tolerated the procedure well.  Findings:               Aortogram:  normal renal arteries, normal  aorta and iliac arteries without significant stenosis             Left Lower Extremity:  This demonstrated normal common femoral artery, profunda femoris artery, superficial femoral artery, and popliteal artery.  The anterior tibial artery was in the dominant runoff into the foot was continuous all the way into the foot providing good blood flow to the metatarsals.  The posterior tibial artery was also patent to the foot although smaller.   Disposition: Patient was taken to the recovery room in stable condition  having tolerated the procedure well.  Complications: None  Shawn [name] 04/06/2018 1:08 PM   This note was created with Dragon Medical transcription system. Any errors in dictation are purely unintentional.

## 2018-04-06 NOTE — Anesthesia Procedure Notes (Signed)
Procedure Name: Intubation Date/Time: 04/06/2018 6:08 PM Performed by: Dionne Bucy, CRNA Pre-anesthesia Checklist: Patient identified, Patient being monitored, Timeout performed, Emergency Drugs available and Suction available Patient Re-evaluated:Patient Re-evaluated prior to induction Oxygen Delivery Method: Circle system utilized Preoxygenation: Pre-oxygenation with 100% oxygen Induction Type: IV induction, Cricoid Pressure applied and Rapid sequence Laryngoscope Size: Mac and 4 Grade View: Grade I Tube type: Oral Tube size: 7.5 mm Number of attempts: 1 Airway Equipment and Method: Stylet Placement Confirmation: ETT inserted through vocal cords under direct vision,  positive ETCO2 and breath sounds checked- equal and bilateral Secured at: 22 cm Tube secured with: Tape Dental Injury: Teeth and Oropharynx as per pre-operative assessment

## 2018-04-06 NOTE — Op Note (Signed)
Date of operation: 04/06/2018.  Surgeon: [name] D.P.M.  Preoperative diagnosis: Osteomyelitis left forefoot.  Postoperative diagnosis: Same with abscess forefoot.  Procedure: Fifth ray resection left foot with I&D abscess forefoot.  Anesthesia: General with popliteal block and local infiltration.  Hemostasis: Pneumatic tourniquet left ankle 250 mmHg.  Estimated blood loss: 5 cc.  Pathology: Left fifth toe and metatarsal.  Cultures: Abscess left forefoot and bone from the proximal border of the metatarsal resection.  Implants: Stimulan rapid cure antibiotic beads impregnated with vancomycin. Fibrillar anticoagulant  Complications: None apparent.  Operative indications: This is a 32 year old male with history of worsening ulceration on his left foot.  Recently admitted with fever and chills and obvious abscess in the left foot.  MRI showed osteomyelitis of the fifth metatarsal.  Operative procedure: Patient was taken to the operating room and placed on the table in the supine position.  Previous popliteal block was performed in the PACU area prior to coming back to the operating suite.  Following satisfactory general anesthesia the left foot was anesthetized with 10 cc of 0.5% bupivacaine plain around the fifth metatarsal and lateral midfoot region.  A pneumatic tourniquet was applied at the level of the left ankle and the foot was prepped and draped in the usual sterile fashion.  The foot was exsanguinated and the tourniquet inflated to 250 mmHg.      Attention was directed to the lateral aspect of the left foot where an approximate 10 cm incision was placed from proximal to distal along the dorsal fifth metatarsal and then elliptically around the medial and lateral base of the fifth toe and rejoining plantarly.  The incision was carried sharply down to the level of the bone and there was immediately upon incision heavy purulence expressed from the wound.  Periosteal dissection  carried along the base of the fifth toe and the fifth metatarsal proximally to separate the surrounding normal tissues.  Using a sagittal saw the fifth metatarsal was transected in the proximal third area and the fifth ray was removed in toto.  Significant purulence with an abscess was noted extending medially along the dorsal fourth and third metatarsal area.  A culture was taken of the purulence from the soft tissue abscess.  At this point the proximal cut end of the fifth metatarsal was irrigated with saline and a bone culture was taken using a curette from the medullary canal.  At this point the wound was thoroughly debrided with a versa jet debrider on a setting of 5 and then thoroughly irrigated with the versa jet on a setting of 3.  Significant involvement was noted dorsally as well as around the base of the fourth toe region.  The wound was then thoroughly irrigated with sterile saline using a bulb syringe.  Plantar ulceration was reapproximated using 3-0 nylon simple interrupted sutures.  Distal aspect of the incision was closed using 3-0 nylon simple interrupted sutures and at this point Fibrillar anticoagulant was placed along the inferior aspect of the wound and the cut end of the fifth metatarsal to help with bleeding.  Antibiotic beads impregnated with vancomycin were then placed into the wound and along the dorsal surface.  The remainder of the wound was then reapproximated using 3-0 nylon simple interrupted sutures.  Xeroform 4 x 4's fluffs ABDs and a Kerlix were applied to the left lower extremity.  The tourniquet was released.  An additional Kerlix and Ace wrap were then applied.  Patient was awakened and transported to the  PACU with vital signs stable and in good condition.

## 2018-04-06 NOTE — Progress Notes (Signed)
Patient clinically stable post lower leg extremity angiogram per Dr Lucky Cowboy. Right groin without bleeding nor hematoma. Denies complaints. Report called to [facility] on 1 A. Questions answered. Sinus rhythm per monitor.

## 2018-04-06 NOTE — H&P (Signed)
[facility] History & Physical Update  The patient was interviewed and re-examined.  The patient's previous History and Physical has been reviewed and is unchanged.  There is no change in the plan of care. We plan to proceed with the scheduled procedure.  Leotis Pain, MD  04/06/2018, 12:33 PM

## 2018-04-06 NOTE — Anesthesia Post-op Follow-up Note (Signed)
Anesthesia QCDR form completed.        

## 2018-04-06 NOTE — Progress Notes (Signed)
Took over care of pt from [facility] [place]. Pt alert and oriented resting in room. Awaiting surgery. No complaints at this time

## 2018-04-06 NOTE — Progress Notes (Signed)
Inpatient Diabetes Program Recommendations  AACE/ADA: New Consensus Statement on Inpatient Glycemic Control (2019)  Target Ranges:  Prepandial:   less than 140 mg/dL      Peak postprandial:   less than 180 mg/dL (1-2 hours)      Critically ill patients:  140 - 180 mg/dL   Results for COY, ROCHFORD (MRN [id number]) as of 04/06/2018 09:00  Ref. Range 04/05/2018 14:35 04/05/2018 17:31 04/05/2018 21:21 04/06/2018 07:40  Glucose-Capillary Latest Ref Range: 70 - 99 mg/dL 448 (H) 322 (H) 249 (H) 275 (H)   Results for DAYAN, DESA (MRN [id number]) as of 04/06/2018 09:00  Ref. Range 07/13/2017 14:23 11/10/2017 04:46 12/19/2017 03:41  Hemoglobin A1C Latest Ref Range: 4.8 - 5.6 % 16.1 (H) 17.2 (H) 16.9 (H)   Review of Glycemic Control  Diabetes history: DM1 (makes no insulin; requires basal, correction, and meal coverage insulin) Outpatient Diabetes medications: Lantus 35 units BID, Regular 0-10 units TID with meals Current orders for Inpatient glycemic control: Lantus 35 units BID, Novolog 0-9 units TID with meals  Inpatient Diabetes Program Recommendations:  Insulin - Basal: Patient received Lantus 35 units at 22:10 on 04/05/18 and has already received Lantus 35 units this morning at 8:19. Anticipate this is too much basal insulin for patient as he has Type 1 DM.  Please consider decreasing Lantus to 35 units daily (starting 04/07/18).  Insulin - Meal Coverage: Will likely need meal coverage but not recommend starting today since patient has gotten Lantus last night and this morning.   HgbA1C: A1C 16.9% on 12/19/17. 6th hospital admission in past 6 months. Inpatient Diabetes Coordinator spoke with patient on 02/07/2018 and on 12/19/17 regarding DM control during prior hospitalizations.   Thanks, [name], RN, MSN, CDE Diabetes Coordinator Inpatient Diabetes Program [phone] (Team Pager from 8am to 5pm)

## 2018-04-06 NOTE — Interval H&P Note (Signed)
History and Physical Interval Note:  04/06/2018 5:22 PM  Shawn [name]  has presented today for surgery, with the diagnosis of Osteomyelitis left fifth metatarsal  The various methods of treatment have been discussed with the patient and family. After consideration of risks, benefits and other options for treatment, the patient has consented to  Procedure(s): IRRIGATION AND DEBRIDEMENT FOOT (Left) AMPUTATION TOE (Left) as a surgical intervention .  The patient's history has been reviewed, patient examined, no change in status, stable for surgery.  I have reviewed the patient's chart and labs.  Questions were answered to the patient's satisfaction.     [name]

## 2018-04-06 NOTE — Progress Notes (Signed)
[place] [facility] [place] NAME: Shawn [name]    MR#:  [id number]  DATE OF BIRTH:  28-Sep-1985  SUBJECTIVE:  CHIEF COMPLAINT:   Chief Complaint  Patient presents with  . Emesis  . Migraine   -Anxious this morning.  Frustrated that he could not sleep at all.  Going for angiogram of his left foot for ulcer -Type I diabetic  REVIEW OF SYSTEMS:  Review of Systems  Constitutional: Positive for malaise/fatigue. Negative for chills and fever.  HENT: Negative for congestion, ear discharge, hearing loss and nosebleeds.   Eyes: Negative for blurred vision and double vision.  Respiratory: Negative for cough, shortness of breath and wheezing.   Cardiovascular: Negative for chest pain and palpitations.  Gastrointestinal: Negative for abdominal pain, constipation, diarrhea, nausea and vomiting.  Genitourinary: Negative for dysuria.  Musculoskeletal: Positive for joint pain and myalgias.  Neurological: Negative for dizziness, focal weakness, seizures, weakness and headaches.  Psychiatric/Behavioral: The patient is nervous/anxious.     DRUG ALLERGIES:   Allergies  Allergen Reactions  . Banana Hives, Nausea And Vomiting and Rash  . Keflex [Cephalexin] Anaphylaxis  . Onion Hives, Nausea And Vomiting and Rash  . Sulfa Antibiotics Anaphylaxis  . Grapeseed Extract [Nutritional Supplements] Itching  . Shellfish Allergy Hives    "ALL SEAFOOD"    VITALS:  Blood pressure 127/90, pulse (!) 107, temperature (!) 100.6 F (38.1 C), temperature source Oral, resp. rate 17, height 5\' 6"  (1.676 m), weight 56.7 kg, SpO2 98 %.  PHYSICAL EXAMINATION:  Physical Exam   GENERAL:  32 y.o.-year-old ill nourished patient lying in the bed with no acute distress.  EYES: Pupils equal, round, reactive to light and accommodation. No scleral icterus. Extraocular muscles intact.  HEENT: Head atraumatic, normocephalic. Oropharynx and nasopharynx clear.  NECK:  Supple, no  jugular venous distention. No thyroid enlargement, no tenderness.  LUNGS: Normal breath sounds bilaterally, no wheezing, rales,rhonchi or crepitation. No use of accessory muscles of respiration.  CARDIOVASCULAR: S1, S2 normal. No murmurs, rubs, or gallops.  ABDOMEN: Soft, nontender, nondistended. Bowel sounds present. No organomegaly or mass.  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  Left foot is swollen with an open wound on the dorsum and on the plantar side near the fifth metatarsal.  Also has significant purulent looking discharge surrounding the lateral 3 toes at their basis.  No cyanosis, or clubbing.  NEUROLOGIC: Cranial nerves II through XII are intact. Muscle strength 5/5 in all extremities. Sensation intact. Gait not checked.  PSYCHIATRIC: The patient is alert and oriented x 3.  SKIN: No obvious rash, lesion, or ulcer.    LABORATORY PANEL:   CBC Recent Labs  Lab 04/06/18 0428  WBC 16.9*  HGB 8.1*  HCT 23.6*  PLT 261   ------------------------------------------------------------------------------------------------------------------  Chemistries  Recent Labs  Lab 04/05/18 1427  04/06/18 0428  NA 132*  --  134*  K 4.4  --  3.9  CL 95*  --  100  CO2 22  --  24  GLUCOSE 478*  --  329*  BUN 33*  --  29*  CREATININE 1.93*   < > 1.62*  CALCIUM 9.0  --  8.0*  AST 10*  --   --   ALT 9  --   --   ALKPHOS 93  --   --   BILITOT 0.9  --   --    < > = values in this interval not  displayed.   ------------------------------------------------------------------------------------------------------------------  Cardiac Enzymes Recent Labs  Lab 04/05/18 1739  TROPONINI <0.03   ------------------------------------------------------------------------------------------------------------------  RADIOLOGY:  Mr Foot Left Wo Contrast  Result Date: 04/06/2018 CLINICAL DATA:  Diabetic patient with chronic pain and swelling about the fifth MTP joint  and fifth toe. The patient developed an ulceration in this area 2-3 months ago. EXAM: MRI OF THE LEFT FOOT WITHOUT CONTRAST TECHNIQUE: Multiplanar, multisequence MR imaging of the left foot was performed. No intravenous contrast was administered. COMPARISON:  Multiple prior plain films, most recently 04/05/2018. FINDINGS: Bones/Joint/Cartilage There is marrow edema throughout almost the entire fifth metatarsal with sparing of the metatarsal base identified. Edema is most intense in the distal 2.5 cm of the fifth metatarsal. Intense edema is present throughout the proximal phalanx of the fifth toe. There is a nondisplaced fracture base of the proximal phalanx. Bone marrow signal is otherwise normal. Ligaments Intact. Muscles and Tendons Intact. No intramuscular fluid collection. 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 Soft tissues Skin ulceration is seen on the plantar surface of the foot at the level of the fifth MTP joint. No underlying abscess. IMPRESSION: Findings consistent with osteomyelitis throughout the proximal phalanx of the second toe and at least the distal 2.5 cm of the fifth metatarsal. Milder degree of edema throughout the remainder of the fifth metatarsal diaphysis could be due to osteomyelitis or reactive change. Nondisplaced fracture of the base the proximal phalanx of the fifth toe is likely pathologic and related to infection. Skin ulceration subjacent to the fifth MTP joint without underlying abscess. Electronically Signed   By: Inge Rise M.D.   On: 04/06/2018 08:55   Dg Foot 2 Views Left  Result Date: 04/05/2018 CLINICAL DATA:  Diabetic patient with pain and swelling of the left foot centered laterally. EXAM: LEFT FOOT - 2 VIEW COMPARISON:  Plain films of the left foot 03/27/2018, 03/23/2018 and 02/26/2018. FINDINGS: Soft tissue swelling centered about the fifth MTP joint and fifth toe has worsened. Again  seen is some periosteal reaction about the proximal phalanx of the fifth toe and rarefaction of bone in the head of the fifth metatarsal and base of the proximal phalanx. Imaged bones otherwise appear normal. No fracture is seen. IMPRESSION: Findings highly suspicious for osteomyelitis in the head of the fifth metatarsal and proximal phalanx of the little toe. Electronically Signed   By: Inge Rise M.D.   On: 04/05/2018 15:34    EKG:   Orders placed or performed during the hospital encounter of 04/05/18  . ED EKG  . ED EKG    ASSESSMENT AND PLAN:   32 year old male with past medical history significant for type 1 diabetes mellitus, GERD, IBS presents to hospital secondary to worsening left foot swelling and ulcer.  1.  Left foot cellulitis with ulcer and possible osteomyelitis of the fifth metatarsal-appreciate podiatry consult -Patient will also be seen by vascular.  Will need angiogram on the leg to check the blood flow and also might need debridement/amputation. -Continue broad-spectrum antibiotics with vancomycin and aztreonam -We will consult infectious disease  2.  Type 1 diabetes mellitus-uncontrolled.  Change Lantus to 35 units daily.  Takes twice daily at home.  Currently n.p.o. and received last night and this morning. -Appreciate diabetes coordinator's input.  Every 2 hour fingersticks as he is n.p.o. until later today.  If sugars start dropping less than 100, will start fluids with D5 -Pre-meal insulin will be started tomorrow.  Also on sliding scale  3.  Depression-Prozac  4.  Diabetic gastroparesis-Reglan with meals  5.  Acute renal failure on CKD stage III-Baseline creatinine seems to be around 1.5. -Avoid nephrotoxins -Gentle hydration for now  6.  Anemia of chronic disease-monitor the need for any transfusion after surgery.  7.  DVT prophylaxis- on lovenox   All the records are reviewed and case discussed with Care Management/Social Workerr. Management  plans discussed with the patient, family and they are in agreement.  CODE STATUS: Full Code  TOTAL TIME TAKING CARE OF THIS PATIENT: 38 minutes.   POSSIBLE D/C IN 2-3 DAYS, DEPENDING ON CLINICAL CONDITION.   Gladstone Lighter M.D on 04/06/2018 at 9:43 AM  Between 7am to 6pm - Pager - [phone]  After 6pm go to [email or web address] - password [email or web address] [facility]  Office  [phone]  CC: Primary care physician; Valera Castle, MD

## 2018-04-06 NOTE — Transfer of Care (Signed)
Immediate Anesthesia Transfer of Care Note  Patient: Shawn [name]  Procedure(s) Performed: IRRIGATION AND DEBRIDEMENT FOOT (Left ) AMPUTATION RAY (Left )  Patient Location: PACU  Anesthesia Type:General  Level of Consciousness: sedated  Airway & Oxygen Therapy: Patient Spontanous Breathing and Patient connected to face mask oxygen  Post-op Assessment: Report given to RN and Post -op Vital signs reviewed and stable  Post vital signs: Reviewed and stable  Last Vitals:  Vitals Value Taken Time  BP 132/85 04/06/2018  7:22 PM  Temp    Pulse 91 04/06/2018  7:24 PM  Resp 19 04/06/2018  7:24 PM  SpO2 100 % 04/06/2018  7:24 PM  Vitals shown include unvalidated device data.  Last Pain:  Vitals:   04/06/18 1552  TempSrc:   PainSc: Asleep      Patients Stated Pain Goal: 2 (49/61/16 [id number])  Complications: No apparent anesthesia complications

## 2018-04-07 ENCOUNTER — Encounter: Payer: Self-pay | Admitting: Podiatry

## 2018-04-07 LAB — CBC
HCT: 25.3 % — ABNORMAL LOW (ref 40.0–52.0)
Hemoglobin: 8.6 g/dL — ABNORMAL LOW (ref 13.0–18.0)
MCH: 28.1 pg (ref 26.0–34.0)
MCHC: 33.9 g/dL (ref 32.0–36.0)
MCV: 82.9 fL (ref 80.0–100.0)
Platelets: 292 10*3/uL (ref 150–440)
RBC: 3.05 MIL/uL — ABNORMAL LOW (ref 4.40–5.90)
RDW: 13.7 % (ref 11.5–14.5)
WBC: 18.2 10*3/uL — AB (ref 3.8–10.6)

## 2018-04-07 LAB — BASIC METABOLIC PANEL
Anion gap: 8 (ref 5–15)
BUN: 32 mg/dL — ABNORMAL HIGH (ref 6–20)
CALCIUM: 8.3 mg/dL — AB (ref 8.9–10.3)
CO2: 24 mmol/L (ref 22–32)
Chloride: 104 mmol/L (ref 98–111)
Creatinine, Ser: 1.52 mg/dL — ABNORMAL HIGH (ref 0.61–1.24)
GFR calc Af Amer: >60 mL/min (ref >60)
GFR calc non Af Amer: >60 mL/min (ref >60)
GLUCOSE: 329 mg/dL — AB (ref 70–99)
Potassium: 4 mmol/L (ref 3.5–5.1)
Sodium: 136 mmol/L (ref 135–145)

## 2018-04-07 LAB — HEMOGLOBIN A1C
Hgb A1c MFr Bld: 12.9 % — ABNORMAL HIGH (ref 4.8–5.6)
MEAN PLASMA GLUCOSE: 324 mg/dL

## 2018-04-07 LAB — GLUCOSE, CAPILLARY
GLUCOSE-CAPILLARY: 205 mg/dL — AB (ref 70–99)
Glucose-Capillary: 127 mg/dL — ABNORMAL HIGH (ref 70–99)
Glucose-Capillary: 318 mg/dL — ABNORMAL HIGH (ref 70–99)
Glucose-Capillary: 340 mg/dL — ABNORMAL HIGH (ref 70–99)
Glucose-Capillary: 125 mg/dL — ABNORMAL HIGH (ref 70–99)

## 2018-04-07 MED ORDER — CLONAZEPAM 1 MG PO TABS
1.0000 mg | ORAL_TABLET | Freq: Two times a day (BID) | ORAL | Status: DC
  Administered 2018-04-07 – 2018-04-11 (×8): 1 mg via ORAL
  Filled 2018-04-07 (×8): qty 1

## 2018-04-07 MED ORDER — INSULIN GLARGINE 100 UNIT/ML ~~LOC~~ SOLN
30.0000 [IU] | Freq: Two times a day (BID) | SUBCUTANEOUS | Status: DC
  Administered 2018-04-07 – 2018-04-08 (×3): 30 [IU] via SUBCUTANEOUS
  Filled 2018-04-07 (×6): qty 0.3

## 2018-04-07 MED ORDER — ZOLPIDEM TARTRATE 5 MG PO TABS
5.0000 mg | ORAL_TABLET | Freq: Every evening | ORAL | Status: DC | PRN
  Administered 2018-04-08 – 2018-04-10 (×4): 5 mg via ORAL
  Filled 2018-04-07 (×4): qty 1

## 2018-04-07 MED ORDER — INSULIN ASPART 100 UNIT/ML ~~LOC~~ SOLN
4.0000 [IU] | Freq: Three times a day (TID) | SUBCUTANEOUS | Status: DC
  Administered 2018-04-07 (×2): 4 [IU] via SUBCUTANEOUS
  Filled 2018-04-07 (×2): qty 1

## 2018-04-07 MED ORDER — SODIUM CHLORIDE 0.9 % IV SOLN
INTRAVENOUS | Status: DC | PRN
  Administered 2018-04-07: 250 mL via INTRAVENOUS

## 2018-04-07 NOTE — Progress Notes (Signed)
[place] at [place] NAME: Shawn [name]    MR#:  [id number]  DATE OF BIRTH:  18-Apr-1986  SUBJECTIVE:  CHIEF COMPLAINT:   Chief Complaint  Patient presents with  . Emesis  . Migraine   -Significant pain in the left foot where surgery was done. -Also complains of anxiety and inability to sleep at nighttime  REVIEW OF SYSTEMS:  Review of Systems  Constitutional: Positive for malaise/fatigue. Negative for chills and fever.  HENT: Negative for congestion, ear discharge, hearing loss and nosebleeds.   Eyes: Negative for blurred vision and double vision.  Respiratory: Negative for cough, shortness of breath and wheezing.   Cardiovascular: Negative for chest pain and palpitations.  Gastrointestinal: Negative for abdominal pain, constipation, diarrhea, nausea and vomiting.  Genitourinary: Negative for dysuria.  Musculoskeletal: Positive for joint pain and myalgias.  Neurological: Negative for dizziness, focal weakness, seizures, weakness and headaches.  Psychiatric/Behavioral: The patient is nervous/anxious.     DRUG ALLERGIES:   Allergies  Allergen Reactions  . Banana Hives, Nausea And Vomiting and Rash  . Keflex [Cephalexin] Rash    No swelling- also taken penicillin without any issue.  . Onion Hives, Nausea And Vomiting and Rash  . Sulfa Antibiotics Anaphylaxis  . Grapeseed Extract [Nutritional Supplements] Itching  . Shellfish Allergy Hives    "ALL SEAFOOD"    VITALS:  Blood pressure (!) 159/117, pulse 91, temperature 97.7 F (36.5 C), temperature source Oral, resp. rate 18, height 5\' 6"  (1.676 m), weight 56.7 kg, SpO2 100 %.  PHYSICAL EXAMINATION:  Physical Exam   GENERAL:  32 y.o.-year-old ill nourished patient lying in the bed with no acute distress.  EYES: Pupils equal, round, reactive to light and accommodation. No scleral icterus. Extraocular muscles intact.  HEENT: Head atraumatic, normocephalic. Oropharynx and  nasopharynx clear.  NECK:  Supple, no jugular venous distention. No thyroid enlargement, no tenderness.  LUNGS: Normal breath sounds bilaterally, no wheezing, rales,rhonchi or crepitation. No use of accessory muscles of respiration.  CARDIOVASCULAR: S1, S2 normal. No murmurs, rubs, or gallops.  ABDOMEN: Soft, nontender, nondistended. Bowel sounds present. No organomegaly or mass.  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  Left foot dressing in place covering the whole foot. NEUROLOGIC: Cranial nerves II through XII are intact. Muscle strength 5/5 in all extremities. Sensation intact. Gait not checked.  PSYCHIATRIC: The patient is alert and oriented x 3.  SKIN: No obvious rash, lesion, or ulcer.    LABORATORY PANEL:   CBC Recent Labs  Lab 04/07/18 0623  WBC 18.2*  HGB 8.6*  HCT 25.3*  PLT 292   ------------------------------------------------------------------------------------------------------------------  Chemistries  Recent Labs  Lab 04/05/18 1427  04/07/18 0623  NA 132*   < > 136  K 4.4   < > 4.0  CL 95*   < > 104  CO2 22   < > 24  GLUCOSE 478*   < > 329*  BUN 33*   < > 32*  CREATININE 1.93*   < > 1.52*  CALCIUM 9.0   < > 8.3*  AST 10*  --   --   ALT 9  --   --   ALKPHOS 93  --   --   BILITOT 0.9  --   --    < > = values in this interval not displayed.   ------------------------------------------------------------------------------------------------------------------  Cardiac Enzymes Recent Labs  Lab 04/05/18 1739  TROPONINI <0.03   ------------------------------------------------------------------------------------------------------------------  RADIOLOGY:  Mr Foot Left Wo Contrast  Result Date: 04/06/2018 CLINICAL DATA:  Diabetic patient with chronic pain and swelling about the fifth MTP joint and fifth toe. The patient developed an ulceration in this area 2-3 months ago. EXAM: MRI OF THE LEFT FOOT WITHOUT CONTRAST  TECHNIQUE: Multiplanar, multisequence MR imaging of the left foot was performed. No intravenous contrast was administered. COMPARISON:  Multiple prior plain films, most recently 04/05/2018. FINDINGS: Bones/Joint/Cartilage There is marrow edema throughout almost the entire fifth metatarsal with sparing of the metatarsal base identified. Edema is most intense in the distal 2.5 cm of the fifth metatarsal. Intense edema is present throughout the proximal phalanx of the fifth toe. There is a nondisplaced fracture base of the proximal phalanx. Bone marrow signal is otherwise normal. Ligaments Intact. Muscles and Tendons Intact. No intramuscular fluid collection. 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 Soft tissues Skin ulceration is seen on the plantar surface of the foot at the level of the fifth MTP joint. No underlying abscess. IMPRESSION: Findings consistent with osteomyelitis throughout the proximal phalanx of the second toe and at least the distal 2.5 cm of the fifth metatarsal. Milder degree of edema throughout the remainder of the fifth metatarsal diaphysis could be due to osteomyelitis or reactive change. Nondisplaced fracture of the base the proximal phalanx of the fifth toe is likely pathologic and related to infection. Skin ulceration subjacent to the fifth MTP joint without underlying abscess. Electronically Signed   By: Inge Rise M.D.   On: 04/06/2018 08:55   Dg Foot 2 Views Left  Result Date: 04/05/2018 CLINICAL DATA:  Diabetic patient with pain and swelling of the left foot centered laterally. EXAM: LEFT FOOT - 2 VIEW COMPARISON:  Plain films of the left foot 03/27/2018, 03/23/2018 and 02/26/2018. FINDINGS: Soft tissue swelling centered about the fifth MTP joint and fifth toe has worsened. Again seen is some periosteal reaction about the proximal phalanx of the fifth toe and rarefaction of bone in the head of the fifth  metatarsal and base of the proximal phalanx. Imaged bones otherwise appear normal. No fracture is seen. IMPRESSION: Findings highly suspicious for osteomyelitis in the head of the fifth metatarsal and proximal phalanx of the little toe. Electronically Signed   By: Inge Rise M.D.   On: 04/05/2018 15:34    EKG:   Orders placed or performed during the hospital encounter of 04/05/18  . ED EKG  . ED EKG    ASSESSMENT AND PLAN:   32 year old male with past medical history significant for type 1 diabetes mellitus, GERD, IBS presents to hospital secondary to worsening left foot swelling and ulcer.  1.  Left foot cellulitis with ulcer and osteomyelitis of the fifth metatarsal-appreciate podiatry consult -Had angiogram showing patent arteries yesterday.  Also taken to the OR by podiatry and had fifth metatarsal ray amputation. -Significant amount of pus was removed and sent for cultures. -Dressing changes per podiatry.  Weightbearing as tolerated with postop shoe. -Might have to going to clean his fourth toe as there was infection around it as well. -Patient will need PICC line and IV antibiotics at discharge. -Infectious disease consult appreciated as well -Currently on broad-spectrum antibiotics with vancomycin and aztreonam  2.  Type 1 diabetes mellitus-uncontrolled.   -Lantus 30 units twice daily at this time.-Appreciate diabetes coordinator's input.   We will add NovoLog pre-meal insulin.  Also on sliding scale  3.  Depression-Prozac  4.  Diabetic gastroparesis-Reglan with meals  5.  Acute renal failure on CKD stage III-Baseline creatinine seems to be around 1.5. -Avoid nephrotoxins -Gentle hydration for now  6.  Anemia of chronic disease-monitor the need for any transfusion after surgery.  7.  DVT prophylaxis- on lovenox   All the records are reviewed and case discussed with Care Management/Social Workerr. Management plans discussed with the patient, family and they are in  agreement.  CODE STATUS: Full Code  TOTAL TIME TAKING CARE OF THIS PATIENT: 36 minutes.   POSSIBLE D/C IN 2-3 DAYS, DEPENDING ON CLINICAL CONDITION.   Gladstone Lighter M.D on 04/07/2018 at 9:31 AM  Between 7am to 6pm - Pager - [phone]  After 6pm go to [email or web address] - password [email or web address] [facility]  Office  [phone]  CC: Primary care physician; Valera Castle, MD

## 2018-04-07 NOTE — Consult Note (Signed)
Pharmacy Antibiotic Note  Shawn [name] is a 32 y.o. male admitted on 04/05/2018 with cellulitis and possible osteomyelitis.  Pharmacy has been consulted for vancomycin dosing. Patient is also receiving Meropenem.  Plan: Continue vancomycin 1000 mg IV every 24 hours.  Goal trough 15-20 mcg/mL.  Trough level ordered prior to the fifth dose. Will continue to monitor renal function and adjust dose as needed.   Height: 5\' 6"  (167.6 cm) Weight: 125 lb (56.7 kg) IBW/kg (Calculated) : 63.8  Temp (24hrs), Avg:98.5 F (36.9 C), Min:97.4 F (36.3 C), Max:100.1 F (37.8 C)  Recent Labs  Lab 04/05/18 1427 04/05/18 1739 04/06/18 0428 04/07/18 0623  WBC 20.8* 18.7* 16.9* 18.2*  CREATININE 1.93* 1.63* 1.62* 1.52*  LATICACIDVEN 1.3  --   --   --     Estimated Creatinine Clearance: 57 mL/min (A) (by C-G formula based on SCr of 1.52 mg/dL (H)).    Allergies  Allergen Reactions  . Banana Hives, Nausea And Vomiting and Rash  . Keflex [Cephalexin] Rash    No swelling- also taken penicillin without any issue.  . Onion Hives, Nausea And Vomiting and Rash  . Sulfa Antibiotics Anaphylaxis  . Grapeseed Extract [Nutritional Supplements] Itching  . Shellfish Allergy Hives    "ALL SEAFOOD"    Antimicrobials this admission: Aztreonam 10/02 >> 10/3 Vancomycin 10/02 >>  Meropenem 10/3>>  Microbiology results: 10/02 BCx: pending 10/03 aerobic/anaerobic Cx: abundant GPC  Thank you for allowing pharmacy to be a part of this patient's care.  Pernell Dupre, PharmD, BCPS Clinical Pharmacist 04/07/2018 11:07 AM

## 2018-04-07 NOTE — Progress Notes (Signed)
1 Day Post-Op   Subjective/Chief Complaint: Shawn [name] seen.  Did okay overnight but significant pain this morning in the left foot.   Objective: Vital signs in last 24 hours: Temp:  [97.4 F (36.3 C)-100.1 F (37.8 C)] 97.7 F (36.5 C) (10/04 0753) Pulse Rate:  [76-113] 91 (10/04 0753) Resp:  [9-22] 18 (10/04 0753) BP: (108-159)/(58-117) 159/117 (10/04 0753) SpO2:  [96 %-100 %] 100 % (10/04 0753) Weight:  [56.7 kg] 56.7 kg (10/03 1218) Last BM Date: 04/05/18  Intake/Output from previous day: 10/03 0701 - 10/04 0700 In: 2078.3 [I.V.:1578.3; IV Piggyback:500] Out: 520 [Urine:500; Blood:20] Intake/Output this shift: Total I/O In: 240 [P.O.:240] Out: -   Moderate bleeding and drainage on the bandage.  Upon removal some significant cyanosis and skin necrosis starting along the dorsal aspect of the forefoot and midfoot medial to the incision where the heaviest area of infection and abscess was noted intraoperatively.  Fourth toe is still viable at this point.  I did review the Shawn [name]'s MRI with radiology and it does appear that there may be some osteomyelitis present in fourth toe and some in the fourth metatarsal head starting.  Lab Results:  Recent Labs    04/06/18 0428 04/07/18 0623  WBC 16.9* 18.2*  HGB 8.1* 8.6*  HCT 23.6* 25.3*  PLT 261 292   BMET Recent Labs    04/06/18 0428 04/07/18 0623  NA 134* 136  K 3.9 4.0  CL 100 104  CO2 24 24  GLUCOSE 329* 329*  BUN 29* 32*  CREATININE 1.62* 1.52*  CALCIUM 8.0* 8.3*   PT/INR No results for input(s): LABPROT, INR in the last 72 hours. ABG Recent Labs    04/05/18 1442  HCO3 23.0    Studies/Results: Mr Foot Left Wo Contrast  Result Date: 04/06/2018 CLINICAL DATA:  Diabetic Shawn [name] with chronic pain and swelling about the fifth MTP joint and fifth toe. The Shawn [name] developed an ulceration in this area 2-3 months ago. EXAM: MRI OF THE LEFT FOOT WITHOUT CONTRAST TECHNIQUE: Multiplanar, multisequence MR imaging of  the left foot was performed. No intravenous contrast was administered. COMPARISON:  Multiple prior plain films, most recently 04/05/2018. FINDINGS: Bones/Joint/Cartilage There is marrow edema throughout almost the entire fifth metatarsal with sparing of the metatarsal base identified. Edema is most intense in the distal 2.5 cm of the fifth metatarsal. Intense edema is present throughout the proximal phalanx of the fifth toe. There is a nondisplaced fracture base of the proximal phalanx. Bone marrow signal is otherwise normal. Ligaments Intact. Muscles and Tendons Intact. No intramuscular fluid collection. 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 Soft tissues Skin ulceration is seen on the plantar surface of the foot at the level of the fifth MTP joint. No underlying abscess. IMPRESSION: Findings consistent with osteomyelitis throughout the proximal phalanx of the second toe and at least the distal 2.5 cm of the fifth metatarsal. Milder degree of edema throughout the remainder of the fifth metatarsal diaphysis could be due to osteomyelitis or reactive change. Nondisplaced fracture of the base the proximal phalanx of the fifth toe is likely pathologic and related to infection. Skin ulceration subjacent to the fifth MTP joint without underlying abscess. Electronically Signed   By: Inge Rise M.D.   On: 04/06/2018 08:55   Dg Foot 2 Views Left  Result Date: 04/05/2018 CLINICAL DATA:  Diabetic Shawn [name] with pain and swelling of the left foot centered laterally. EXAM: LEFT FOOT - 2 VIEW  COMPARISON:  Plain films of the left foot 03/27/2018, 03/23/2018 and 02/26/2018. FINDINGS: Soft tissue swelling centered about the fifth MTP joint and fifth toe has worsened. Again seen is some periosteal reaction about the proximal phalanx of the fifth toe and rarefaction of bone in the head of the fifth metatarsal and base of the proximal phalanx. Imaged  bones otherwise appear normal. No fracture is seen. IMPRESSION: Findings highly suspicious for osteomyelitis in the head of the fifth metatarsal and proximal phalanx of the little toe. Electronically Signed   By: Inge Rise M.D.   On: 04/05/2018 15:34    Anti-infectives: Anti-infectives (From admission, onward)   Start     Dose/Rate Route Frequency Ordered Stop   04/06/18 1841  vancomycin (VANCOCIN) powder  Status:  Discontinued       As needed 04/06/18 1842 04/06/18 1920   04/06/18 1800  meropenem (MERREM) 1 g in sodium chloride 0.9 % 100 mL IVPB     1 g 200 mL/hr over 30 Minutes Intravenous Every 8 hours 04/06/18 1740     04/06/18 0856  vancomycin (VANCOCIN) IVPB 1000 mg/200 mL premix  Status:  Discontinued     1,000 mg 200 mL/hr over 60 Minutes Intravenous 60 min pre-op 04/06/18 0856 04/06/18 1432   04/06/18 0330  vancomycin (VANCOCIN) IVPB 1000 mg/200 mL premix     1,000 mg 200 mL/hr over 60 Minutes Intravenous Every 24 hours 04/05/18 1903     04/06/18 0000  aztreonam (AZACTAM) 2 g in sodium chloride 0.9 % 100 mL IVPB  Status:  Discontinued     2 g 200 mL/hr over 30 Minutes Intravenous Every 8 hours 04/05/18 1903 04/06/18 1740   04/05/18 1515  vancomycin (VANCOCIN) IVPB 1000 mg/200 mL premix     1,000 mg 200 mL/hr over 60 Minutes Intravenous  Once 04/05/18 1513 04/05/18 1721   04/05/18 1515  aztreonam (AZACTAM) 2 g in sodium chloride 0.9 % 100 mL IVPB     2 g 200 mL/hr over 30 Minutes Intravenous  Once 04/05/18 1513 04/05/18 1620      Assessment/Plan: s/p Procedure(s): IRRIGATION AND DEBRIDEMENT FOOT (Left) AMPUTATION RAY (Left) Assessment: 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   Plan: Dry sterile dressing reapplied to the left foot.  Discussed with the Shawn [name] that he will most likely need a follow-up debridement and we did discuss that with the MRI findings may need amputation of the fourth toe with resection of some of the  fourth metatarsal as well.  Most likely will have significant soft tissue defect which will require wound VAC treatment.  At this point we will reassess tomorrow and most likely plan for debridement Sunday or early next week to allow for demarcation.  LOS: 2 days    Shawn [name] 04/07/2018

## 2018-04-08 LAB — CBC
HCT: 23.5 % — ABNORMAL LOW (ref 40.0–52.0)
Hemoglobin: 7.8 g/dL — ABNORMAL LOW (ref 13.0–18.0)
MCH: 27 pg (ref 26.0–34.0)
MCHC: 33.1 g/dL (ref 32.0–36.0)
MCV: 81.6 fL (ref 80.0–100.0)
Platelets: 331 10*3/uL (ref 150–440)
RBC: 2.87 MIL/uL — ABNORMAL LOW (ref 4.40–5.90)
RDW: 13.6 % (ref 11.5–14.5)
WBC: 16.2 10*3/uL — ABNORMAL HIGH (ref 3.8–10.6)

## 2018-04-08 LAB — GLUCOSE, CAPILLARY
GLUCOSE-CAPILLARY: 85 mg/dL (ref 70–99)
Glucose-Capillary: 75 mg/dL (ref 70–99)
Glucose-Capillary: 70 mg/dL (ref 70–99)
Glucose-Capillary: 91 mg/dL (ref 70–99)
Glucose-Capillary: 97 mg/dL (ref 70–99)

## 2018-04-08 LAB — C DIFFICILE QUICK SCREEN W PCR REFLEX
C DIFFICLE (CDIFF) ANTIGEN: NEGATIVE
C Diff interpretation: NOT DETECTED
C Diff toxin: NEGATIVE

## 2018-04-08 SURGERY — Surgical Case
Anesthesia: *Unknown

## 2018-04-08 MED ORDER — LOPERAMIDE HCL 2 MG PO CAPS
2.0000 mg | ORAL_CAPSULE | Freq: Four times a day (QID) | ORAL | Status: DC | PRN
  Administered 2018-04-08 – 2018-04-11 (×3): 2 mg via ORAL
  Filled 2018-04-08 (×4): qty 1

## 2018-04-08 MED ORDER — SODIUM CHLORIDE 0.9 % IV SOLN
3.0000 g | Freq: Four times a day (QID) | INTRAVENOUS | Status: DC
  Administered 2018-04-08 – 2018-04-11 (×12): 3 g via INTRAVENOUS
  Filled 2018-04-08 (×17): qty 3

## 2018-04-08 NOTE — Progress Notes (Signed)
   04/08/18 1900  Clinical Encounter Type  Visited With Patient  Visit Type Follow-up;Spiritual support  Referral From Nurse  Recommendations Follow-up as requested.  Spiritual Encounters  Spiritual Needs Emotional;Prayer  Stress Factors  Patient Stress Factors Major life changes;Health changes   Chaplain met with patient, who was teary, afraid, and struggling with the implications of additional surgery. Chaplain offered active listening, reflective questions, and empathy as patient expressed multiple levels of disappointment, frustration, and fear. Patient chronicled years of trouble and pain. Chaplain encouraged the patient to practice simple mindfulness (e.g., focus on breathing, staying in the moment, and doing the next right thing.). Further, Chaplain helped patient to reframe the narrative of powerlessness by exploring moments where the patient does have agency and a voice. Chaplain prayed with the patient and will try to visit with him prior to surgery tomorrow morning.

## 2018-04-08 NOTE — H&P (View-Only) (Signed)
2 Days Post-Op   Subjective/Chief Complaint: Patient seen.  Still having significant pain in the left foot.   Objective: Vital signs in last 24 hours: Temp:  [98 F (36.7 C)-98.1 F (36.7 C)] 98 F (36.7 C) (10/05 0744) Pulse Rate:  [81-93] 86 (10/05 1002) Resp:  [18] 18 (10/05 0744) BP: (104-129)/(74-91) 129/74 (10/05 1002) SpO2:  [100 %] 100 % (10/05 0744) Last BM Date: 04/05/18  Intake/Output from previous day: 10/04 0701 - 10/05 0700 In: 941 [P.O.:840; I.V.:1; IV Piggyback:100] Out: 2 [Urine:1; Stool:1] Intake/Output this shift: Total I/O In: 360 [P.O.:360] Out: 300 [Urine:300]  Significant drainage noted on the bandaging.  Upon removal necrosis along the dorsal aspect of the foot appears to be demarcating.  Ulcerative areas noted at the medial base of the fourth toe and the third interspace area.  No expressible purulence.  Lab Results:  Recent Labs    04/07/18 0623 04/08/18 0355  WBC 18.2* 16.2*  HGB 8.6* 7.8*  HCT 25.3* 23.5*  PLT 292 331   BMET Recent Labs    04/06/18 0428 04/07/18 0623  NA 134* 136  K 3.9 4.0  CL 100 104  CO2 24 24  GLUCOSE 329* 329*  BUN 29* 32*  CREATININE 1.62* 1.52*  CALCIUM 8.0* 8.3*   PT/INR No results for input(s): LABPROT, INR in the last 72 hours. ABG Recent Labs    04/05/18 1442  HCO3 23.0    Studies/Results: No results found.  Anti-infectives: Anti-infectives (From admission, onward)   Start     Dose/Rate Route Frequency Ordered Stop   04/06/18 1841  vancomycin (VANCOCIN) powder  Status:  Discontinued       As needed 04/06/18 1842 04/06/18 1920   04/06/18 1800  meropenem (MERREM) 1 g in sodium chloride 0.9 % 100 mL IVPB     1 g 200 mL/hr over 30 Minutes Intravenous Every 8 hours 04/06/18 1740     04/06/18 0856  vancomycin (VANCOCIN) IVPB 1000 mg/200 mL premix  Status:  Discontinued     1,000 mg 200 mL/hr over 60 Minutes Intravenous 60 min pre-op 04/06/18 0856 04/06/18 1432   04/06/18 0330  vancomycin  (VANCOCIN) IVPB 1000 mg/200 mL premix     1,000 mg 200 mL/hr over 60 Minutes Intravenous Every 24 hours 04/05/18 1903     04/06/18 0000  aztreonam (AZACTAM) 2 g in sodium chloride 0.9 % 100 mL IVPB  Status:  Discontinued     2 g 200 mL/hr over 30 Minutes Intravenous Every 8 hours 04/05/18 1903 04/06/18 1740   04/05/18 1515  vancomycin (VANCOCIN) IVPB 1000 mg/200 mL premix     1,000 mg 200 mL/hr over 60 Minutes Intravenous  Once 04/05/18 1513 04/05/18 1721   04/05/18 1515  aztreonam (AZACTAM) 2 g in sodium chloride 0.9 % 100 mL IVPB     2 g 200 mL/hr over 30 Minutes Intravenous  Once 04/05/18 1513 04/05/18 1620      Assessment/Plan: s/p Procedure(s): IRRIGATION AND DEBRIDEMENT FOOT (Left) AMPUTATION RAY (Left) Assessment: Osteomyelitis left foot   Plan: Dressing reapplied to the left foot.  Discussed with the patient that at this point we need to pursue fourth ray resection with debridement of all the necrotic tissue on the top of the foot.  Patient is somewhat upset but understands.  Discussed possible risks and complications of the procedure including inability to heal due to his diabetes or infection.  No guarantees can be given.  Patient will be n.p.o. after midnight.  Consent  form for debridement left foot with fourth ray resection.  Plan for surgery tomorrow morning.  LOS: 3 days    [name] 04/08/2018

## 2018-04-08 NOTE — Progress Notes (Signed)
Pt has had several loose incontinent episodes. MD [place] notified. Order received to collect stool for rule out c-diff. (stool collected and sent). Per MD wait for result, if negative order immodium QID PRN.

## 2018-04-08 NOTE — Progress Notes (Signed)
Pts BG 70, Per MD S[place] hold scheduled dose of Lantus.

## 2018-04-08 NOTE — Progress Notes (Signed)
[facility] [place] NAME: Shawn [name]    MR#:  [id number]  DATE OF BIRTH:  1985-07-24  SUBJECTIVE:   Patient still complaining of left foot pain and some nausea.  He was a bit upset this morning when he found out that he likely needed amputation of his left fourth toe and further debridement.  REVIEW OF SYSTEMS:  Review of Systems  Constitutional: Positive for malaise/fatigue. Negative for chills and fever.  HENT: Negative for congestion, ear discharge, hearing loss and nosebleeds.   Eyes: Negative for blurred vision and double vision.  Respiratory: Negative for cough, shortness of breath and wheezing.   Cardiovascular: Negative for chest pain and palpitations.  Gastrointestinal: Negative for abdominal pain, constipation, diarrhea, nausea and vomiting.  Genitourinary: Negative for dysuria.  Musculoskeletal: Positive for joint pain and myalgias.  Neurological: Negative for dizziness, focal weakness, seizures, weakness and headaches.  Psychiatric/Behavioral: The patient is nervous/anxious.     DRUG ALLERGIES:   Allergies  Allergen Reactions  . Banana Hives, Nausea And Vomiting and Rash  . Keflex [Cephalexin] Rash    No swelling- also taken penicillin without any issue.  . Onion Hives, Nausea And Vomiting and Rash  . Sulfa Antibiotics Anaphylaxis  . Grapeseed Extract [Nutritional Supplements] Itching  . Shellfish Allergy Hives    "ALL SEAFOOD"    VITALS:  Blood pressure 129/74, pulse 86, temperature 98 F (36.7 C), temperature source Oral, resp. rate 18, height 5\' 6"  (1.676 m), weight 56.7 kg, SpO2 100 %.  PHYSICAL EXAMINATION:  Physical Exam   GENERAL:  32 y.o.-year-old ill nourished patient lying in the bed with no acute distress.  EYES: Pupils equal, round, reactive to light and accommodation. No scleral icterus. Extraocular muscles intact.  HEENT: Head atraumatic, normocephalic. Oropharynx and nasopharynx clear.  NECK:   Supple, no jugular venous distention. No thyroid enlargement, no tenderness.  LUNGS: Normal breath sounds bilaterally, no wheezing, rales,rhonchi or crepitation. No use of accessory muscles of respiration.  CARDIOVASCULAR: S1, S2 normal. No murmurs, rubs, or gallops.  ABDOMEN: Soft, nontender, nondistended. Bowel sounds present. No organomegaly or mass.  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  Left foot dressing in place covering the whole foot. NEUROLOGIC: Cranial nerves II through XII are intact. No focal motor or sensory deficits b/l.  PSYCHIATRIC: The patient is alert and oriented x 3.  SKIN: No obvious rash, lesion, or ulcer.    LABORATORY PANEL:   CBC Recent Labs  Lab 04/08/18 0355  WBC 16.2*  HGB 7.8*  HCT 23.5*  PLT 331   ------------------------------------------------------------------------------------------------------------------  Chemistries  Recent Labs  Lab 04/05/18 1427  04/07/18 0623  NA 132*   < > 136  K 4.4   < > 4.0  CL 95*   < > 104  CO2 22   < > 24  GLUCOSE 478*   < > 329*  BUN 33*   < > 32*  CREATININE 1.93*   < > 1.52*  CALCIUM 9.0   < > 8.3*  AST 10*  --   --   ALT 9  --   --   ALKPHOS 93  --   --   BILITOT 0.9  --   --    < > = values in this interval not displayed.   ------------------------------------------------------------------------------------------------------------------  Cardiac Enzymes Recent Labs  Lab 04/05/18 1739  TROPONINI <0.03   ------------------------------------------------------------------------------------------------------------------  RADIOLOGY:  No results found.  EKG:  Orders placed or performed during the hospital encounter of 04/05/18  . ED EKG  . ED EKG    ASSESSMENT AND PLAN:   32 year old male with past medical history significant for type 1 diabetes mellitus, GERD, IBS presents to hospital secondary to worsening left foot swelling and ulcer.  1.  Left foot  cellulitis with ulcer and osteomyelitis of the fifth metatarsal-appreciate podiatry consult -Had angiogram showing patent arteries, and Also taken to the OR by podiatry and had fifth metatarsal ray amputation. -Significant amount of pus was removed and sent for cultures. -Dressing changes per podiatry.  Weightbearing as tolerated with postop shoe. -Appreciate podiatry input and patient will need further debridement and amputation of his fourth toe.  Plan for surgery tomorrow. -Patient will need PICC line and IV antibiotics at discharge. -Infectious disease consult appreciated as well -Currently on broad-spectrum antibiotics with vancomycin and aztreonam  2.  Type 1 diabetes mellitus-uncontrolled.   -Lantus 30 units twice daily at this time. -Appreciate diabetes coordinator's input.   - cont. Novolog with meals. Cont. sliding scale  3.  Depression-Prozac  4.  Diabetic gastroparesis- cont. Reglan with meals.   5.  Acute renal failure on CKD stage III-Baseline creatinine seems to be around 1.5. - improved w/ hydration. Renal dose meds and avoid nephrotoxins.    6.  Anemia of chronic disease- Hg. Stable and will cont. To monitor.  - no need for transfusion.   7. Anxiety - cont. Klonopin.    All the records are reviewed and case discussed with Care Management/Social Workerr. Management plans discussed with the patient, family and they are in agreement.  CODE STATUS: Full Code  TOTAL TIME TAKING CARE OF THIS PATIENT: 30 minutes.   POSSIBLE D/C IN 2-3 DAYS, DEPENDING ON CLINICAL CONDITION.   Henreitta Leber M.D on 04/08/2018 at 3:05 PM  Between 7am to 6pm - Pager - [phone]  After 6pm go to [email or web address] - password [email or web address] [facility]  Office  [phone]  CC: Primary care physician; Valera Castle, MD

## 2018-04-08 NOTE — Progress Notes (Signed)
2 Days Post-Op   Subjective/Chief Complaint: Patient seen.  Still having significant pain in the left foot.   Objective: Vital signs in last 24 hours: Temp:  [98 F (36.7 C)-98.1 F (36.7 C)] 98 F (36.7 C) (10/05 0744) Pulse Rate:  [81-93] 86 (10/05 1002) Resp:  [18] 18 (10/05 0744) BP: (104-129)/(74-91) 129/74 (10/05 1002) SpO2:  [100 %] 100 % (10/05 0744) Last BM Date: 04/05/18  Intake/Output from previous day: 10/04 0701 - 10/05 0700 In: 941 [P.O.:840; I.V.:1; IV Piggyback:100] Out: 2 [Urine:1; Stool:1] Intake/Output this shift: Total I/O In: 360 [P.O.:360] Out: 300 [Urine:300]  Significant drainage noted on the bandaging.  Upon removal necrosis along the dorsal aspect of the foot appears to be demarcating.  Ulcerative areas noted at the medial base of the fourth toe and the third interspace area.  No expressible purulence.  Lab Results:  Recent Labs    04/07/18 0623 04/08/18 0355  WBC 18.2* 16.2*  HGB 8.6* 7.8*  HCT 25.3* 23.5*  PLT 292 331   BMET Recent Labs    04/06/18 0428 04/07/18 0623  NA 134* 136  K 3.9 4.0  CL 100 104  CO2 24 24  GLUCOSE 329* 329*  BUN 29* 32*  CREATININE 1.62* 1.52*  CALCIUM 8.0* 8.3*   PT/INR No results for input(s): LABPROT, INR in the last 72 hours. ABG Recent Labs    04/05/18 1442  HCO3 23.0    Studies/Results: No results found.  Anti-infectives: Anti-infectives (From admission, onward)   Start     Dose/Rate Route Frequency Ordered Stop   04/06/18 1841  vancomycin (VANCOCIN) powder  Status:  Discontinued       As needed 04/06/18 1842 04/06/18 1920   04/06/18 1800  meropenem (MERREM) 1 g in sodium chloride 0.9 % 100 mL IVPB     1 g 200 mL/hr over 30 Minutes Intravenous Every 8 hours 04/06/18 1740     04/06/18 0856  vancomycin (VANCOCIN) IVPB 1000 mg/200 mL premix  Status:  Discontinued     1,000 mg 200 mL/hr over 60 Minutes Intravenous 60 min pre-op 04/06/18 0856 04/06/18 1432   04/06/18 0330  vancomycin  (VANCOCIN) IVPB 1000 mg/200 mL premix     1,000 mg 200 mL/hr over 60 Minutes Intravenous Every 24 hours 04/05/18 1903     04/06/18 0000  aztreonam (AZACTAM) 2 g in sodium chloride 0.9 % 100 mL IVPB  Status:  Discontinued     2 g 200 mL/hr over 30 Minutes Intravenous Every 8 hours 04/05/18 1903 04/06/18 1740   04/05/18 1515  vancomycin (VANCOCIN) IVPB 1000 mg/200 mL premix     1,000 mg 200 mL/hr over 60 Minutes Intravenous  Once 04/05/18 1513 04/05/18 1721   04/05/18 1515  aztreonam (AZACTAM) 2 g in sodium chloride 0.9 % 100 mL IVPB     2 g 200 mL/hr over 30 Minutes Intravenous  Once 04/05/18 1513 04/05/18 1620      Assessment/Plan: s/p Procedure(s): IRRIGATION AND DEBRIDEMENT FOOT (Left) AMPUTATION RAY (Left) Assessment: Osteomyelitis left foot   Plan: Dressing reapplied to the left foot.  Discussed with the patient that at this point we need to pursue fourth ray resection with debridement of all the necrotic tissue on the top of the foot.  Patient is somewhat upset but understands.  Discussed possible risks and complications of the procedure including inability to heal due to his diabetes or infection.  No guarantees can be given.  Patient will be n.p.o. after midnight.  Consent  form for debridement left foot with fourth ray resection.  Plan for surgery tomorrow morning.  LOS: 3 days    [name] 04/08/2018

## 2018-04-08 NOTE — Progress Notes (Signed)
ID Pt had 5th toe ray excision- going back for surgery tomorrow for 4 toe removal and debridement of necrotic issue. Pt is nervous about it. Pastoral service at bed side Dressing not removed Strep intermedius prelim culture Change meropenem to unasyn- may be able to DC vanco once culture finalize

## 2018-04-09 ENCOUNTER — Inpatient Hospital Stay: Payer: Medicaid Other | Admitting: Anesthesiology

## 2018-04-09 ENCOUNTER — Encounter
Admission: EM | Disposition: A | Discharge status: Home Health | Payer: Self-pay | Source: Home / Self Care | Attending: Specialist

## 2018-04-09 ENCOUNTER — Encounter: Payer: Self-pay | Admitting: Anesthesiology

## 2018-04-09 HISTORY — PX: AMPUTATION: SHX166

## 2018-04-09 LAB — GLUCOSE, CAPILLARY
GLUCOSE-CAPILLARY: 70 mg/dL (ref 70–99)
GLUCOSE-CAPILLARY: 168 mg/dL — AB (ref 70–99)
GLUCOSE-CAPILLARY: 252 mg/dL — AB (ref 70–99)
Glucose-Capillary: 71 mg/dL (ref 70–99)
Glucose-Capillary: 63 mg/dL — ABNORMAL LOW (ref 70–99)
Glucose-Capillary: 87 mg/dL (ref 70–99)

## 2018-04-09 LAB — CBC
HCT: 26.5 % — ABNORMAL LOW (ref 40.0–52.0)
Hemoglobin: 8.8 g/dL — ABNORMAL LOW (ref 13.0–18.0)
MCH: 27.1 pg (ref 26.0–34.0)
MCHC: 33.1 g/dL (ref 32.0–36.0)
MCV: 81.7 fL (ref 80.0–100.0)
Platelets: 376 10*3/uL (ref 150–440)
RBC: 3.24 MIL/uL — AB (ref 4.40–5.90)
RDW: 13.9 % (ref 11.5–14.5)
WBC: 13 10*3/uL — ABNORMAL HIGH (ref 3.8–10.6)

## 2018-04-09 LAB — VANCOMYCIN, TROUGH: Vancomycin Tr: 45 ug/mL (ref 15–20)

## 2018-04-09 SURGERY — AMPUTATION, FOOT, RAY
Anesthesia: General | Laterality: Left

## 2018-04-09 MED ORDER — SUCCINYLCHOLINE CHLORIDE 20 MG/ML IJ SOLN
INTRAMUSCULAR | Status: AC
  Filled 2018-04-09: qty 1

## 2018-04-09 MED ORDER — LIDOCAINE HCL (PF) 2 % IJ SOLN
INTRAMUSCULAR | Status: AC
  Filled 2018-04-09: qty 10

## 2018-04-09 MED ORDER — BUPIVACAINE HCL (PF) 0.5 % IJ SOLN
INTRAMUSCULAR | Status: AC
  Filled 2018-04-09: qty 30

## 2018-04-09 MED ORDER — FENTANYL CITRATE (PF) 100 MCG/2ML IJ SOLN
25.0000 ug | INTRAMUSCULAR | Status: DC | PRN
  Administered 2018-04-09: 50 ug via INTRAVENOUS

## 2018-04-09 MED ORDER — ONDANSETRON HCL 4 MG/2ML IJ SOLN
INTRAMUSCULAR | Status: DC | PRN
  Administered 2018-04-09: 4 mg via INTRAVENOUS

## 2018-04-09 MED ORDER — SODIUM CHLORIDE 0.9 % IV SOLN
INTRAVENOUS | Status: DC | PRN
  Administered 2018-04-09: 08:00:00 via INTRAVENOUS

## 2018-04-09 MED ORDER — ROPIVACAINE HCL 5 MG/ML IJ SOLN
INTRAMUSCULAR | Status: AC
  Filled 2018-04-09: qty 30

## 2018-04-09 MED ORDER — VANCOMYCIN HCL 1000 MG IV SOLR
INTRAVENOUS | Status: AC
  Filled 2018-04-09: qty 1000

## 2018-04-09 MED ORDER — LIDOCAINE HCL (PF) 1 % IJ SOLN
INTRAMUSCULAR | Status: AC
  Filled 2018-04-09: qty 5

## 2018-04-09 MED ORDER — PHENYLEPHRINE HCL 10 MG/ML IJ SOLN
INTRAMUSCULAR | Status: AC
  Filled 2018-04-09: qty 1

## 2018-04-09 MED ORDER — SUCCINYLCHOLINE CHLORIDE 20 MG/ML IJ SOLN
INTRAMUSCULAR | Status: DC | PRN
  Administered 2018-04-09: 80 mg via INTRAVENOUS

## 2018-04-09 MED ORDER — FENTANYL CITRATE (PF) 100 MCG/2ML IJ SOLN
INTRAMUSCULAR | Status: AC
  Filled 2018-04-09: qty 2

## 2018-04-09 MED ORDER — INSULIN GLARGINE 100 UNIT/ML ~~LOC~~ SOLN
20.0000 [IU] | Freq: Two times a day (BID) | SUBCUTANEOUS | Status: DC
  Administered 2018-04-09: 20 [IU] via SUBCUTANEOUS
  Filled 2018-04-09 (×3): qty 0.2

## 2018-04-09 MED ORDER — ACETAMINOPHEN 10 MG/ML IV SOLN
INTRAVENOUS | Status: DC | PRN
  Administered 2018-04-09: 350 mg via INTRAVENOUS

## 2018-04-09 MED ORDER — PROMETHAZINE HCL 25 MG/ML IJ SOLN: 6.2500 mg | INTRAMUSCULAR | Status: DC | PRN

## 2018-04-09 MED ORDER — FENTANYL CITRATE (PF) 100 MCG/2ML IJ SOLN
INTRAMUSCULAR | Status: DC | PRN
  Administered 2018-04-09 (×2): 50 ug via INTRAVENOUS

## 2018-04-09 MED ORDER — MIDAZOLAM HCL 2 MG/2ML IJ SOLN
INTRAMUSCULAR | Status: DC | PRN
  Administered 2018-04-09: 2 mg via INTRAVENOUS

## 2018-04-09 MED ORDER — ACETAMINOPHEN 10 MG/ML IV SOLN
INTRAVENOUS | Status: AC
  Filled 2018-04-09: qty 100

## 2018-04-09 MED ORDER — ROPIVACAINE HCL 5 MG/ML IJ SOLN
INTRAMUSCULAR | Status: DC | PRN
  Administered 2018-04-09: 30 mL via PERINEURAL

## 2018-04-09 MED ORDER — LIDOCAINE HCL (CARDIAC) PF 100 MG/5ML IV SOSY
PREFILLED_SYRINGE | INTRAVENOUS | Status: DC | PRN
  Administered 2018-04-09: 100 mg via INTRAVENOUS

## 2018-04-09 MED ORDER — PROPOFOL 10 MG/ML IV BOLUS
INTRAVENOUS | Status: DC | PRN
  Administered 2018-04-09: 120 mg via INTRAVENOUS

## 2018-04-09 MED ORDER — ONDANSETRON HCL 4 MG/2ML IJ SOLN
INTRAMUSCULAR | Status: AC
  Filled 2018-04-09: qty 2

## 2018-04-09 MED ORDER — PHENYLEPHRINE HCL 10 MG/ML IJ SOLN
INTRAMUSCULAR | Status: DC | PRN
  Administered 2018-04-09 (×6): 100 ug via INTRAVENOUS

## 2018-04-09 MED ORDER — LIDOCAINE HCL (PF) 1 % IJ SOLN
INTRAMUSCULAR | Status: DC | PRN
  Administered 2018-04-09: 5 mL via SUBCUTANEOUS

## 2018-04-09 MED ORDER — MIDAZOLAM HCL 2 MG/2ML IJ SOLN
INTRAMUSCULAR | Status: AC
  Filled 2018-04-09: qty 2

## 2018-04-09 SURGICAL SUPPLY — 51 items
BANDAGE ACE 4X5 VEL STRL LF (GAUZE/BANDAGES/DRESSINGS) ×3 IMPLANT
BLADE MED AGGRESSIVE (BLADE) ×3 IMPLANT
BLADE OSC/SAGITTAL MD 5.5X18 (BLADE) ×3 IMPLANT
BLADE SURG 15 STRL LF DISP TIS (BLADE) ×2 IMPLANT
BLADE SURG 15 STRL SS (BLADE) ×4
BLADE SURG MINI STRL (BLADE) ×3 IMPLANT
BNDG ESMARK 4X12 TAN STRL LF (GAUZE/BANDAGES/DRESSINGS) ×1 IMPLANT
BNDG GAUZE 4.5X4.1 6PLY STRL (MISCELLANEOUS) ×5 IMPLANT
CANISTER SUCT 1200ML W/VALVE (MISCELLANEOUS) ×3 IMPLANT
CLOSURE WOUND 1/4X4 (GAUZE/BANDAGES/DRESSINGS) ×1
CUFF TOURN 18 STER (MISCELLANEOUS) ×1 IMPLANT
CUFF TOURN DUAL PL 12 NO SLV (MISCELLANEOUS) ×3 IMPLANT
DRAPE FLUOR MINI C-ARM 54X84 (DRAPES) ×1 IMPLANT
DRSG MEPITEL 4X7.2 (GAUZE/BANDAGES/DRESSINGS) ×2 IMPLANT
DURAPREP 26ML APPLICATOR (WOUND CARE) ×3 IMPLANT
ELECT REM PT RETURN 9FT ADLT (ELECTROSURGICAL) ×3
ELECTRODE REM PT RTRN 9FT ADLT (ELECTROSURGICAL) ×1 IMPLANT
GAUZE 4X4 16PLY RFD (DISPOSABLE) ×5 IMPLANT
GAUZE PACKING IODOFORM 1/2 (PACKING) ×2 IMPLANT
GAUZE PETRO XEROFOAM 1X8 (MISCELLANEOUS) ×1 IMPLANT
GAUZE STRETCH 2X75IN STRL (MISCELLANEOUS) ×1 IMPLANT
GLOVE BIO SURGEON STRL SZ7.5 (GLOVE) ×7 IMPLANT
GLOVE INDICATOR 8.0 STRL GRN (GLOVE) ×7 IMPLANT
GOWN STRL REUS W/ TWL LRG LVL3 (GOWN DISPOSABLE) ×2 IMPLANT
GOWN STRL REUS W/TWL LRG LVL3 (GOWN DISPOSABLE) ×4
HANDPIECE VERSAJET DEBRIDEMENT (MISCELLANEOUS) ×3 IMPLANT
KIT STIMULAN RAPID CURE 5CC (Orthopedic Implant) ×2 IMPLANT
KIT TURNOVER KIT A (KITS) ×3 IMPLANT
LABEL OR SOLS (LABEL) ×1 IMPLANT
NDL FILTER BLUNT 18X1 1/2 (NEEDLE) ×1 IMPLANT
NDL HYPO 25X1 1.5 SAFETY (NEEDLE) ×2 IMPLANT
NEEDLE FILTER BLUNT 18X 1/2SAF (NEEDLE)
NEEDLE FILTER BLUNT 18X1 1/2 (NEEDLE) IMPLANT
NEEDLE HYPO 25X1 1.5 SAFETY (NEEDLE) IMPLANT
NS IRRIG 500ML POUR BTL (IV SOLUTION) ×3 IMPLANT
PACK EXTREMITY ARMC (MISCELLANEOUS) ×3 IMPLANT
PAD ABD DERMACEA PRESS 5X9 (GAUZE/BANDAGES/DRESSINGS) ×4 IMPLANT
SOL .9 NS 3000ML IRR  AL (IV SOLUTION) ×2
SOL .9 NS 3000ML IRR UROMATIC (IV SOLUTION) ×1 IMPLANT
SOL PREP PVP 2OZ (MISCELLANEOUS) ×3
SOLUTION PREP PVP 2OZ (MISCELLANEOUS) ×1 IMPLANT
STAPLER SKIN PROX 35W (STAPLE) ×2 IMPLANT
STOCKINETTE STRL 6IN 960660 (GAUZE/BANDAGES/DRESSINGS) ×3 IMPLANT
STRIP CLOSURE SKIN 1/4X4 (GAUZE/BANDAGES/DRESSINGS) ×2 IMPLANT
SUT ETHILON 3-0 FS-10 30 BLK (SUTURE) ×3
SUT ETHILON 4-0 (SUTURE)
SUT ETHILON 4-0 FS2 18XMFL BLK (SUTURE)
SUTURE EHLN 3-0 FS-10 30 BLK (SUTURE) ×1 IMPLANT
SUTURE ETHLN 4-0 FS2 18XMF BLK (SUTURE) IMPLANT
SWAB DUAL CULTURE TRANS RED ST (MISCELLANEOUS) ×1 IMPLANT
SYR 10ML LL (SYRINGE) ×3 IMPLANT

## 2018-04-09 NOTE — Anesthesia Procedure Notes (Signed)
Anesthesia Regional Block: Popliteal block   Pre-Anesthetic Checklist: ,, timeout performed, Correct Patient, Correct Site, Correct Laterality, Correct Procedure, Correct Position, site marked, Risks and benefits discussed,  Surgical consent,  Pre-op evaluation,  At surgeon's request and post-op pain management  Laterality: Lower and Left  Prep: chloraprep       Needles:  Injection technique: Single-shot  Needle Type: Echogenic Needle     Needle Length: 9cm  Needle Gauge: 21     Additional Needles:   Procedures:,,,, ultrasound used (permanent image in chart),,,,  Narrative:  Start time: 04/09/2018 8:20 AM End time: 04/09/2018 8:24 AM Injection made incrementally with aspirations every 5 mL.  Performed by: Personally  Anesthesiologist: Martha Clan, MD  Additional Notes: Functioning IV was confirmed and monitors were applied.  A echogenic needle was used. Sterile prep,hand hygiene and sterile gloves were used. Minimal sedation used for procedure.   No paresthesia endorsed by patient during the procedure.  Negative aspiration and negative test dose prior to incremental administration of local anesthetic. The patient tolerated the procedure well with no immediate complications.

## 2018-04-09 NOTE — Anesthesia Postprocedure Evaluation (Signed)
Anesthesia Post Note  Patient: Shawn [name]  Procedure(s) Performed: AMPUTATION RAY (Left )  Patient location during evaluation: PACU Anesthesia Type: General Level of consciousness: awake and alert Pain management: pain level controlled Vital Signs Assessment: post-procedure vital signs reviewed and stable Respiratory status: spontaneous breathing, nonlabored ventilation, respiratory function stable and patient connected to nasal cannula oxygen Cardiovascular status: blood pressure returned to baseline and stable Postop Assessment: no apparent nausea or vomiting Anesthetic complications: no     Last Vitals:  Vitals:   04/09/18 1637 04/09/18 1922  BP: (!) 140/92 124/88  Pulse: 92 (!) 101  Resp:  17  Temp: 36.7 C 36.9 C  SpO2: 100% 98%    Last Pain:  Vitals:   04/09/18 1934  TempSrc:   PainSc: 9                  Martha Clan

## 2018-04-09 NOTE — Progress Notes (Signed)
Sedation given by CRNA Delsa Sale.

## 2018-04-09 NOTE — Anesthesia Procedure Notes (Signed)
Procedure Name: Intubation Date/Time: 04/09/2018 8:42 AM Performed by: Aline Brochure, CRNA Pre-anesthesia Checklist: Patient identified, Emergency Drugs available, Suction available and Patient being monitored Patient Re-evaluated:Patient Re-evaluated prior to induction Oxygen Delivery Method: Circle system utilized Preoxygenation: Pre-oxygenation with 100% oxygen Induction Type: IV induction, Rapid sequence and Cricoid Pressure applied Laryngoscope Size: Mac and 4 Grade View: Grade I Tube type: Oral Tube size: 7.5 mm Number of attempts: 1 Airway Equipment and Method: Stylet Placement Confirmation: ETT inserted through vocal cords under direct vision,  positive ETCO2 and breath sounds checked- equal and bilateral Secured at: 22 cm Tube secured with: Tape Dental Injury: Teeth and Oropharynx as per pre-operative assessment

## 2018-04-09 NOTE — Progress Notes (Signed)
15 minute call to floor. 

## 2018-04-09 NOTE — Anesthesia Post-op Follow-up Note (Signed)
Anesthesia QCDR form completed.        

## 2018-04-09 NOTE — Progress Notes (Signed)
Patient states he can feel RN touching his left great toe and lower leg above the dressing site.

## 2018-04-09 NOTE — Progress Notes (Signed)
Patient wanted to turn over on his side to rest.

## 2018-04-09 NOTE — Anesthesia Preprocedure Evaluation (Signed)
Anesthesia Evaluation  Patient identified by MRN, date of birth, ID band Patient awake    Reviewed: Allergy & Precautions, H&P , NPO status , Patient's Chart, lab work & pertinent test results  History of Anesthesia Complications Negative for: history of anesthetic complications  Airway Mallampati: II  TM Distance: >3 FB Neck ROM: full    Dental  (+) Chipped, Poor Dentition, Missing   Pulmonary neg pulmonary ROS, neg shortness of breath,           Cardiovascular Exercise Tolerance: Good (-) angina(-) Past MI and (-) DOE negative cardio ROS       Neuro/Psych PSYCHIATRIC DISORDERS Depression negative neurological ROS     GI/Hepatic Neg liver ROS, GERD  Medicated and Controlled,  Endo/Other  diabetes, Poorly Controlled, Type 2  Renal/GU      Musculoskeletal   Abdominal   Peds  Hematology negative hematology ROS (+)   Anesthesia Other Findings Past Medical History: No date: Diabetes mellitus without complication (HCC) No date: GERD (gastroesophageal reflux disease) No date: IBS (irritable bowel syndrome)  Past Surgical History: 12/20/2017: IRRIGATION AND DEBRIDEMENT FOOT; Right     Comment:  Procedure: IRRIGATION AND DEBRIDEMENT FOOT;  Surgeon:               Sharlotte Alamo, DPM;  Location: [facility] ORS;  Service:               Podiatry;  Laterality: Right;  BMI    Body Mass Index:  18.56 kg/m      Reproductive/Obstetrics negative OB ROS                             Anesthesia Physical  Anesthesia Plan  ASA: III  Anesthesia Plan: General   Post-op Pain Management: GA combined w/ Regional for post-op pain   Induction: Intravenous  PONV Risk Score and Plan: Ondansetron, Dexamethasone and Midazolam  Airway Management Planned: LMA  Additional Equipment:   Intra-op Plan:   Post-operative Plan: Extubation in OR  Informed Consent: I have reviewed the patients History and Physical,  chart, labs and discussed the procedure including the risks, benefits and alternatives for the proposed anesthesia with the patient or authorized representative who has indicated his/her understanding and acceptance.   Dental Advisory Given  Plan Discussed with: Anesthesiologist, CRNA and Surgeon  Anesthesia Plan Comments: (Patient consented for risks of anesthesia including but not limited to:  - adverse reactions to medications - damage to teeth, lips or other oral mucosa - sore throat or hoarseness - Damage to heart, brain, lungs or loss of life  Patient voiced understanding.)        Anesthesia Quick Evaluation

## 2018-04-09 NOTE — Progress Notes (Signed)
[facility] [place] NAME: Shawn [name]    MR#:  [id number]  DATE OF BIRTH:  1985-11-24  SUBJECTIVE:   Patient is status post left fourth ray amputation and debridement of his wound today.  His blood sugars have also been running a bit on the low side but he is asymptomatic.  Patient's mother is at bedside.  REVIEW OF SYSTEMS:  Review of Systems  Constitutional: Positive for malaise/fatigue. Negative for chills and fever.  HENT: Negative for congestion, ear discharge, hearing loss and nosebleeds.   Eyes: Negative for blurred vision and double vision.  Respiratory: Negative for cough, shortness of breath and wheezing.   Cardiovascular: Negative for chest pain and palpitations.  Gastrointestinal: Negative for abdominal pain, constipation, diarrhea, nausea and vomiting.  Genitourinary: Negative for dysuria.  Musculoskeletal: Positive for joint pain. Negative for myalgias.  Neurological: Negative for dizziness, focal weakness, seizures, weakness and headaches.  Psychiatric/Behavioral: The patient is nervous/anxious.     DRUG ALLERGIES:   Allergies  Allergen Reactions  . Banana Hives, Nausea And Vomiting and Rash  . Keflex [Cephalexin] Rash    No swelling- also taken penicillin without any issue.  . Onion Hives, Nausea And Vomiting and Rash  . Sulfa Antibiotics Anaphylaxis  . Grapeseed Extract [Nutritional Supplements] Itching  . Shellfish Allergy Hives    "ALL SEAFOOD"    VITALS:  Blood pressure (!) 146/97, pulse 96, temperature (!) 97.5 F (36.4 C), resp. rate 17, height 5\' 6"  (1.676 m), weight 56.7 kg, SpO2 100 %.  PHYSICAL EXAMINATION:  Physical Exam   GENERAL:  32 y.o.-year-old ill nourished patient lying in the bed with no acute distress.  EYES: Pupils equal, round, reactive to light and accommodation. No scleral icterus. Extraocular muscles intact.  HEENT: Head atraumatic, normocephalic. Oropharynx and nasopharynx clear.   NECK:  Supple, no jugular venous distention. No thyroid enlargement, no tenderness.  LUNGS: Normal breath sounds bilaterally, no wheezing, rales,rhonchi or crepitation. No use of accessory muscles of respiration.  CARDIOVASCULAR: S1, S2 normal. No murmurs, rubs, or gallops.  ABDOMEN: Soft, nontender, nondistended. Bowel sounds present. No organomegaly or mass.  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  Left foot dressing in place covering the whole foot. NEUROLOGIC: Cranial nerves II through XII are intact. No focal motor or sensory deficits b/l.  PSYCHIATRIC: The patient is alert and oriented x 3.  SKIN: No obvious rash, lesion, or ulcer.    LABORATORY PANEL:   CBC Recent Labs  Lab 04/09/18 0340  WBC 13.0*  HGB 8.8*  HCT 26.5*  PLT 376   ------------------------------------------------------------------------------------------------------------------  Chemistries  Recent Labs  Lab 04/05/18 1427  04/07/18 0623  NA 132*   < > 136  K 4.4   < > 4.0  CL 95*   < > 104  CO2 22   < > 24  GLUCOSE 478*   < > 329*  BUN 33*   < > 32*  CREATININE 1.93*   < > 1.52*  CALCIUM 9.0   < > 8.3*  AST 10*  --   --   ALT 9  --   --   ALKPHOS 93  --   --   BILITOT 0.9  --   --    < > = values in this interval not displayed.   ------------------------------------------------------------------------------------------------------------------  Cardiac Enzymes Recent Labs  Lab 04/05/18 1739  TROPONINI <0.03   ------------------------------------------------------------------------------------------------------------------  RADIOLOGY:  No results  found.  EKG:   Orders placed or performed during the hospital encounter of 04/05/18  . ED EKG  . ED EKG    ASSESSMENT AND PLAN:   32 year old male with past medical history significant for type 1 diabetes mellitus, GERD, IBS presents to hospital secondary to worsening left foot swelling and ulcer.  1.   Left foot cellulitis with ulcer and osteomyelitis -appreciate podiatry consult -Had angiogram showing patent arteries, and Also taken to the OR by podiatry and had fifth metatarsal ray amputation. -Significant amount of pus was removed and sent for cultures. Wound cultures from 10/3 are growing Strep. Intermedius. -Patient is status post debridement and ray amputation of the fourth toe on the left foot.  Continue pain control and broad-spectrum antibiotics for now. -Dressing changes per podiatry.  Weightbearing as tolerated with postop shoe. -Patient will need PICC line and IV antibiotics at discharge. - await further ID input.   2.  Type 1 diabetes mellitus-uncontrolled.   - BS have been running low therefore Lantus dose have been reduced to 20 units, will DC NovoLog with meals for now, continue sliding scale insulin.  Follow blood sugars.  3.  Depression-Prozac  4.  Diabetic gastroparesis- cont. Reglan with meals.   5.  Acute renal failure on CKD stage III-Baseline creatinine seems to be around 1.5. - improved w/ hydration and at baseline. Renal dose meds and avoid nephrotoxins.    6.  Anemia of chronic disease- Hg. Stable and will cont. To monitor.  - no need for transfusion.   7. Anxiety - cont. Klonopin.    All the records are reviewed and case discussed with Care Management/Social Workerr. Management plans discussed with the patient, family and they are in agreement.  CODE STATUS: Full Code  TOTAL TIME TAKING CARE OF THIS PATIENT: 30 minutes.   POSSIBLE D/C IN 2-3 DAYS, DEPENDING ON CLINICAL CONDITION.   Henreitta Leber M.D on 04/09/2018 at 12:32 PM  Between 7am to 6pm - Pager - [phone]  After 6pm go to [email or web address] - password [email or web address]  Office  [phone]  CC: Primary care physician; Valera Castle, MD

## 2018-04-09 NOTE — Consult Note (Signed)
Pharmacy Antibiotic Note  Shawn [name] is a 32 y.o. male admitted on 04/05/2018 with cellulitis and possible osteomyelitis.  Pharmacy has been consulted for vancomycin dosing. Patient is also receiving Meropenem.  Plan: Continue vancomycin 1000 mg IV every 24 hours.  Goal trough 15-20 mcg/mL.  Trough level ordered prior to the fifth dose. Will continue to monitor renal function and adjust dose as needed.   10/06 0300 vanc level 45. Drawn while running. Recheck tomorrow night for actual trough.  Height: 5\' 6"  (167.6 cm) Weight: 125 lb (56.7 kg) IBW/kg (Calculated) : 63.8  Temp (24hrs), Avg:98.1 F (36.7 C), Min:97.9 F (36.6 C), Max:98.4 F (36.9 C)  Recent Labs  Lab 04/05/18 1427 04/05/18 1739 04/06/18 0428 04/07/18 0623 04/08/18 0355 04/09/18 0340  WBC 20.8* 18.7* 16.9* 18.2* 16.2* 13.0*  CREATININE 1.93* 1.63* 1.62* 1.52*  --   --   LATICACIDVEN 1.3  --   --   --   --   --   VANCOTROUGH  --   --   --   --   --  45*    Estimated Creatinine Clearance: 57 mL/min (A) (by C-G formula based on SCr of 1.52 mg/dL (H)).    Allergies  Allergen Reactions  . Banana Hives, Nausea And Vomiting and Rash  . Keflex [Cephalexin] Rash    No swelling- also taken penicillin without any issue.  . Onion Hives, Nausea And Vomiting and Rash  . Sulfa Antibiotics Anaphylaxis  . Grapeseed Extract [Nutritional Supplements] Itching  . Shellfish Allergy Hives    "ALL SEAFOOD"    Antimicrobials this admission: Aztreonam 10/02 >> 10/3 Vancomycin 10/02 >>  Meropenem 10/3>>  Microbiology results: 10/02 BCx: pending 10/03 aerobic/anaerobic Cx: abundant GPC  Thank you for allowing pharmacy to be a part of this patient's care.  Eloise Harman, PharmD, BCPS Clinical Pharmacist 04/09/2018 4:33 AM

## 2018-04-09 NOTE — Progress Notes (Signed)
Patient had an uneventful night. Patient's glucose was 70 this morning which was endorsed to OR charge nurse since patient is NPO from [place]. Patient had several questions about power of attorney paperwork. Postoral care provider spoke with patient and mom Tammy this morning. Patient also requested to update emergency contact list.

## 2018-04-09 NOTE — Op Note (Signed)
Date of operation: 04/09/2018.  Surgeon: [name] D.P.M.  Preoperative diagnosis: Osteomyelitis left foot with dorsal gangrenous changes.  Postoperative diagnosis: Same with dorsal midfoot abscess.  Procedure: 1.  Multiple incision I&D left forefoot and midfoot. 2.  Fourth ray resection left foot.  Anesthesia: General endotracheal with popliteal block left leg.  Hemostasis: None.  Estimated blood loss: 50 cc.  Pathology: Left fourth ray.  Implants: Stimulan rapid cure antibiotic beads impregnated with vancomycin.  Drains: Iodoform gauze dorsal incision left midfoot.  Complications: None apparent.  Operative indications: This is a 32 year old male with chronic ulceration and osteomyelitis in his left foot.  Underwent fifth ray resection and debridement a few days ago.  Continued with drainage and progressive necrosis along the dorsal aspect of the foot.  MRI did show osteomyelitis in the fourth toe and metatarsal so decision was made to re-debride the necrotic areas of the left foot with fourth ray resection.  Operative procedure: In the PACU area a popliteal block was administered to the left lower extremity by anesthesia.  The patient was then transported to the operating suite and placed on the table in the supine position.  Following satisfactory general endotracheal anesthesia the left foot was prepped and draped in the usual sterile fashion.     Attention was then directed to the dorsal aspect of the left foot where a large necrotic area of skin was noted dorsally along the medial aspect of the previous incision.  Using a 15 blade an incision was made along the line of demarcation between necrotic and devitalized tissue with excision of the previous incision.  Incision was carried sharply down to the level of the deep subcutaneous and visible bony structures and removed in toe to toe.  Incision was carried into the third interspace along the base of the fourth toe and connecting  plantarly and periosteal dissection carried back along the fourth metatarsal which then was transected using a sagittal saw and the fourth ray was removed in toto.  Minimal bleeding was noted in the tissues at this point.  On compression of the dorsum of the foot there was some purulence noted extending from proximal and medial to the I&D site.  A second approximate 2.5 cm linear incision was made proximal to distal over the proximal midfoot area and connected with the previous wound using hemostats.  Care was taken to identify the dorsalis pedis artery and retract this out of the way.  The wounds on the midfoot as well as the open wounds at the amputation site were then thoroughly irrigated using a versa jet debrider on a setting of 1.  Nonbleeding areas along the lateral aspect of the wound were then sectionally removed until there was found to be active good bleeding at all incision margins.  The wound was again thoroughly irrigated using the versa jet on a setting of 1.  The dorsal incision was then partially closed and packed with iodoform gauze to allow for drainage.  Three 3-0 nylon simple interrupted sutures were then applied to the lateral wound to better reapproximated to the soft tissues but there was still a significant open defect noted.  Stimulan rapid cure antibiotic beads were then packed into the wound and the wound was then covered using Mepitel and staples to secure the beads into the wound.  4 x 4's ABDs to Kerlix and an Ace were then applied to the left lower extremity.  Patient tolerated the procedure and anesthesia well and was extubated and transported  to the PACU with vital signs stable and in good condition.

## 2018-04-09 NOTE — Progress Notes (Signed)
Pt arrived back to room 149 from PACU. Pt alert and oriented. No complaints of pain. Surgical dressing clean dry and intact. Left foot elevated on two pillows, Bed in lowest position call bell in reach bed alarm on.

## 2018-04-09 NOTE — Progress Notes (Signed)
   04/09/18 0700  Clinical Encounter Type  Visited With Patient;Family (Mother, Doristine Bosworth)  Visit Type Follow-up;Spiritual support  Recommendations Follow-up, as requested.  Spiritual Encounters  Spiritual Needs Emotional;Prayer  Stress Factors  Patient Stress Factors Major life changes   Chaplain returned to pray for the patient before surgery. Patient and his mother, Doristine Bosworth, initiated a conversation about contact information and AD. Chaplain facilitated a change in the patient's contact information and explained the concept of AD. Patient may request AD education after his surgery.

## 2018-04-09 NOTE — Transfer of Care (Signed)
Immediate Anesthesia Transfer of Care Note  Patient: Shawn [name]  Procedure(s) Performed: AMPUTATION RAY (Left )  Patient Location: PACU  Anesthesia Type:General  Level of Consciousness: awake  Airway & Oxygen Therapy: Patient connected to face mask oxygen  Post-op Assessment: Post -op Vital signs reviewed and stable  Post vital signs: stable  Last Vitals:  Vitals Value Taken Time  BP 108/76 04/09/2018  9:50 AM  Temp    Pulse 72 04/09/2018  9:50 AM  Resp 17 04/09/2018  9:50 AM  SpO2 100 % 04/09/2018  9:50 AM  Vitals shown include unvalidated device data.  Last Pain:  Vitals:   04/08/18 2337  TempSrc: Oral  PainSc:       Patients Stated Pain Goal: 2 (33/53/31 7409)  Complications: No apparent anesthesia complications

## 2018-04-09 NOTE — Progress Notes (Signed)
Left foot pink in color, toes and leg warm to Touch.

## 2018-04-09 NOTE — Progress Notes (Signed)
   04/09/18 1340  Clinical Encounter Type  Visited With Patient;Health care provider  Visit Type Follow-up  Referral From Chaplain  Consult/Referral To Chaplain   This chaplain followed up with patient based on information from out-going on-call chaplain.  Patient was on the phone with family, declined visit.  Chaplain encouraged him to reach out as needed.  Chaplain also checked in with patient's nurse.

## 2018-04-09 NOTE — Interval H&P Note (Signed)
History and Physical Interval Note:  04/09/2018 7:47 AM  Shawn [name]  has presented today for surgery, with the diagnosis of left foot osteomyelitis with gangrenous changes left foot  The various methods of treatment have been discussed with the patient and family. After consideration of risks, benefits and other options for treatment, the patient has consented to  Procedure(s): AMPUTATION RAY (Left) as a surgical intervention .  The patient's history has been reviewed, patient examined, no change in status, stable for surgery.  I have reviewed the patient's chart and labs.  Questions were answered to the patient's satisfaction.     [name]

## 2018-04-10 ENCOUNTER — Inpatient Hospital Stay: Payer: Self-pay

## 2018-04-10 ENCOUNTER — Encounter: Payer: Self-pay | Admitting: Podiatry

## 2018-04-10 LAB — GLUCOSE, CAPILLARY
GLUCOSE-CAPILLARY: 185 mg/dL — AB (ref 70–99)
GLUCOSE-CAPILLARY: 201 mg/dL — AB (ref 70–99)
Glucose-Capillary: 200 mg/dL — ABNORMAL HIGH (ref 70–99)
Glucose-Capillary: 151 mg/dL — ABNORMAL HIGH (ref 70–99)
Glucose-Capillary: 180 mg/dL — ABNORMAL HIGH (ref 70–99)

## 2018-04-10 LAB — CULTURE, BLOOD (ROUTINE X 2)
Culture: NO GROWTH
Culture: NO GROWTH
Special Requests: ADEQUATE

## 2018-04-10 LAB — VANCOMYCIN, TROUGH: Vancomycin Tr: 15 ug/mL (ref 15–20)

## 2018-04-10 LAB — SURGICAL PATHOLOGY

## 2018-04-10 MED ORDER — SODIUM CHLORIDE 0.9% FLUSH: 10.0000 mL | INTRAVENOUS | Status: DC | PRN

## 2018-04-10 MED ORDER — INSULIN GLARGINE 100 UNIT/ML ~~LOC~~ SOLN
20.0000 [IU] | Freq: Every day | SUBCUTANEOUS | Status: DC
  Filled 2018-04-10: qty 0.2

## 2018-04-10 MED ORDER — SODIUM CHLORIDE 0.9% FLUSH
10.0000 mL | Freq: Two times a day (BID) | INTRAVENOUS | Status: DC
  Administered 2018-04-11: 10 mL

## 2018-04-10 MED ORDER — INSULIN ASPART 100 UNIT/ML ~~LOC~~ SOLN
3.0000 [IU] | Freq: Three times a day (TID) | SUBCUTANEOUS | Status: DC
  Administered 2018-04-10 – 2018-04-11 (×5): 3 [IU] via SUBCUTANEOUS
  Filled 2018-04-10 (×5): qty 1

## 2018-04-10 NOTE — Progress Notes (Signed)
[facility] [place] NAME: Shawn [name]    MR#:  [id number]  DATE OF BIRTH:  10/28/1985  SUBJECTIVE:   Patient was a bit hypoglycemic this morning but asymptomatic.  Insulin has been adjusted. Still complaining of some pain on his left foot and ongoing nausea.   REVIEW OF SYSTEMS:  Review of Systems  Constitutional: Positive for malaise/fatigue. Negative for chills and fever.  HENT: Negative for congestion, ear discharge, hearing loss and nosebleeds.   Eyes: Negative for blurred vision and double vision.  Respiratory: Negative for cough, shortness of breath and wheezing.   Cardiovascular: Negative for chest pain and palpitations.  Gastrointestinal: Positive for nausea. Negative for abdominal pain, constipation, diarrhea and vomiting.  Genitourinary: Negative for dysuria.  Musculoskeletal: Positive for joint pain. Negative for myalgias.  Neurological: Negative for dizziness, focal weakness, seizures, weakness and headaches.  Psychiatric/Behavioral: The patient is not nervous/anxious.     DRUG ALLERGIES:   Allergies  Allergen Reactions  . Banana Hives, Nausea And Vomiting and Rash  . Keflex [Cephalexin] Rash    No swelling- also taken penicillin without any issue.  . Onion Hives, Nausea And Vomiting and Rash  . Sulfa Antibiotics Anaphylaxis  . Grapeseed Extract [Nutritional Supplements] Itching  . Shellfish Allergy Hives    "ALL SEAFOOD"    VITALS:  Blood pressure 116/78, pulse 84, temperature 98.1 F (36.7 C), temperature source Oral, resp. rate 18, height 5\' 6"  (1.676 m), weight 56.7 kg, SpO2 100 %.  PHYSICAL EXAMINATION:  Physical Exam   GENERAL:  32 y.o.-year-old ill nourished patient lying in the bed with no acute distress.  EYES: Pupils equal, round, reactive to light and accommodation. No scleral icterus. Extraocular muscles intact.  HEENT: Head atraumatic, normocephalic. Oropharynx and nasopharynx clear.  NECK:   Supple, no jugular venous distention. No thyroid enlargement, no tenderness.  LUNGS: Normal breath sounds bilaterally, no wheezing, rales,rhonchi or crepitation. No use of accessory muscles of respiration.  CARDIOVASCULAR: S1, S2 normal. No murmurs, rubs, or gallops.  ABDOMEN: Soft, nontender, nondistended. Bowel sounds present. No organomegaly or mass.  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  Left foot dressing in place covering the whole foot. NEUROLOGIC: Cranial nerves II through XII are intact. No focal motor or sensory deficits b/l.  PSYCHIATRIC: The patient is alert and oriented x 3.  SKIN: No obvious rash, lesion, or ulcer.    LABORATORY PANEL:   CBC Recent Labs  Lab 04/09/18 0340  WBC 13.0*  HGB 8.8*  HCT 26.5*  PLT 376   ------------------------------------------------------------------------------------------------------------------  Chemistries  Recent Labs  Lab 04/05/18 1427  04/07/18 0623  NA 132*   < > 136  K 4.4   < > 4.0  CL 95*   < > 104  CO2 22   < > 24  GLUCOSE 478*   < > 329*  BUN 33*   < > 32*  CREATININE 1.93*   < > 1.52*  CALCIUM 9.0   < > 8.3*  AST 10*  --   --   ALT 9  --   --   ALKPHOS 93  --   --   BILITOT 0.9  --   --    < > = values in this interval not displayed.   ------------------------------------------------------------------------------------------------------------------  Cardiac Enzymes Recent Labs  Lab 04/05/18 1739  TROPONINI <0.03   ------------------------------------------------------------------------------------------------------------------  RADIOLOGY:  [place] Ekg Site Rite  Result Date: 04/10/2018 If  Site Rite image not attached, placement could not be confirmed due to current cardiac rhythm.   EKG:   Orders placed or performed during the hospital encounter of 04/05/18  . ED EKG  . ED EKG    ASSESSMENT AND PLAN:   32 year old male with past medical history significant for  type 1 diabetes mellitus, GERD, IBS presents to hospital secondary to worsening left foot swelling and ulcer.  1.  Left foot cellulitis with ulcer and osteomyelitis -appreciate podiatry consult -Had angiogram showing patent arteries, and Also taken to the OR by podiatry and had fifth metatarsal ray amputation. -Significant amount of pus was removed and sent for cultures. Wound cultures from 10/3 are growing Strep. Intermedius. -Patient is status post debridement and ray amputation of the fourth toe on the left foot POD # 1 today.   -Continue pain control as per podiatry.  Antibiotics have been narrowed down to just Unasyn for now.  Appreciate infectious disease input.  Discussed with infectious disease and patient will likely need long-term IV antibiotics and PICC line order has been placed. -Continue dressing changes as per podiatry.  2.  Type 1 diabetes mellitus-uncontrolled.   -Patient having some low blood sugars.  Lantus dose has been reduced to just nightly.  We will add some pre-meal NovoLog insulin and continue sliding scale for now.  3.  Depression-Prozac  4.  Diabetic gastroparesis- cont. Reglan with meals.   5.  Acute renal failure on CKD stage III-Baseline creatinine seems to be around 1.5. - improved w/ hydration and at baseline. Renal dose meds and avoid nephrotoxins.    6.  Anemia of chronic disease- Hg. Stable and will cont. To monitor.  - no need for transfusion.   7. Anxiety - cont. Klonopin.    All the records are reviewed and case discussed with Care Management/Social Workerr. Management plans discussed with the patient, family and they are in agreement.  CODE STATUS: Full Code  TOTAL TIME TAKING CARE OF THIS PATIENT: 30 minutes.   POSSIBLE D/C IN 1-2 DAYS, DEPENDING ON CLINICAL CONDITION.   Henreitta Leber M.D on 04/10/2018 at 3:13 PM  Between 7am to 6pm - Pager - [phone]  After 6pm go to [email or web address] - password [email or web address] [email or web address]   Office  [phone]  CC: Primary care physician; Valera Castle, MD

## 2018-04-10 NOTE — Plan of Care (Signed)
  Problem: Education: Goal: Knowledge of General Education information will improve Description Including pain rating scale, medication(s)/side effects and non-pharmacologic comfort measures Outcome: Progressing   Problem: Health Behavior/Discharge Planning: Goal: Ability to manage health-related needs will improve Outcome: Progressing   Problem: Clinical Measurements: Goal: Ability to maintain clinical measurements within normal limits will improve Outcome: Progressing Goal: Will remain free from infection Outcome: Progressing Goal: Diagnostic test results will improve Outcome: Progressing Goal: Respiratory complications will improve Outcome: Progressing Goal: Cardiovascular complication will be avoided Outcome: Progressing   Problem: Activity: Goal: Risk for activity intolerance will decrease Outcome: Progressing   Problem: Nutrition: Goal: Adequate nutrition will be maintained Outcome: Progressing   Problem: Coping: Goal: Level of anxiety will decrease Outcome: Progressing   Problem: Elimination: Goal: Will not experience complications related to bowel motility Outcome: Progressing Goal: Will not experience complications related to urinary retention Outcome: Progressing   Problem: Pain Managment: Goal: General experience of comfort will improve Outcome: Progressing   Problem: Safety: Goal: Ability to remain free from injury will improve Outcome: Progressing   Problem: Skin Integrity: Goal: Risk for impaired skin integrity will decrease Outcome: Progressing   Problem: Education: Goal: Required Educational Video(s) Outcome: Progressing   Problem: Clinical Measurements: Goal: Ability to maintain clinical measurements within normal limits will improve Outcome: Progressing Goal: Postoperative complications will be avoided or minimized Outcome: Progressing   Problem: Skin Integrity: Goal: Demonstration of wound healing without infection will  improve Outcome: Progressing   Problem: Education: Goal: Understanding of CV disease, CV risk reduction, and recovery process will improve Outcome: Progressing Goal: Individualized Educational Video(s) Outcome: Progressing   Problem: Activity: Goal: Ability to return to baseline activity level will improve Outcome: Progressing   Problem: Cardiovascular: Goal: Ability to achieve and maintain adequate cardiovascular perfusion will improve Outcome: Progressing Goal: Vascular access site(s) Level 0-1 will be maintained Outcome: Progressing   Problem: Health Behavior/Discharge Planning: Goal: Ability to safely manage health-related needs after discharge will improve Outcome: Progressing

## 2018-04-10 NOTE — Progress Notes (Signed)
Patient had incidence of one-time incontinence occurrence in bed. Patient cleaned and bathed by Nurse Techs as well as changed linen. Patient complained of pain and requested pain medication and Immodium. Pt is negative for C-Diff. Patient asked if antibiotics could be causing the diarrhea. Explained that it may cause GI upset and or diarrhea but is needed to for therapeutic purposes. Administered pain med per PRN order and also administered Imodium per PRN order. Will continue to monitor to end of shift.

## 2018-04-10 NOTE — Progress Notes (Signed)
Peripherally Inserted Central Catheter/Midline Placement  The IV Nurse has discussed with the patient and/or persons authorized to consent for the patient, the purpose of this procedure and the potential benefits and risks involved with this procedure.  The benefits include less needle sticks, lab draws from the catheter, and the patient may be discharged home with the catheter. Risks include, but not limited to, infection, bleeding, blood clot (thrombus formation), and puncture of an artery; nerve damage and irregular heartbeat and possibility to perform a PICC exchange if needed/ordered by physician.  Alternatives to this procedure were also discussed.  Bard Power PICC patient education guide, fact sheet on infection prevention and patient information card has been provided to patient /or left at bedside.    PICC/Midline Placement Documentation  PICC Single Lumen 33/61/22 PICC Right Basilic 36 cm 1 cm (Active)  Indication for Insertion or Continuance of Line Home intravenous therapies (PICC only) 04/10/2018  5:45 PM  Exposed Catheter (cm) 1 cm 04/10/2018  5:45 PM  Site Assessment Clean;Dry;Intact 04/10/2018  5:45 PM  Line Status Flushed;Saline locked;Blood return noted 04/10/2018  5:45 PM  Dressing Type Transparent;Securing device 04/10/2018  5:45 PM  Dressing Status Clean;Dry;Intact;Antimicrobial disc in place 04/10/2018  5:45 PM  Dressing Change Due 04/17/18 04/10/2018  5:45 PM       Frances Maywood 04/10/2018, 6:15 PM

## 2018-04-10 NOTE — Treatment Plan (Signed)
Diagnosis: Diabetic foot infection /osteomyelitis Baseline Creatinine 1.52  Culture Result: strep intermedius  Allergies  Allergen Reactions  . Banana Hives, Nausea And Vomiting and Rash  . Keflex [Cephalexin] Rash    No swelling- also taken penicillin without any issue.  . Onion Hives, Nausea And Vomiting and Rash  . Sulfa Antibiotics Anaphylaxis  . Grapeseed Extract [Nutritional Supplements] Itching  . Shellfish Allergy Hives    "ALL SEAFOOD"    OPAT Orders Discharge antibiotics: Unasyn 12 grams continuous infusion  every 24 hrs Duration is 6 weeks  End Date:05/19/18   PIC Care Per Protocol:  Labs weekly while on IV antibiotics: _X_ CBC with differential __X CMP _X_ CRP X__ ESR  __ Please pull PIC at completion of IV antibiotics  __ Please leave PIC in place until doctor has seen patient or been notified  Fax weekly labs to [phone]  Call with critical value or any questions [phone]  Clinic Follow Up Appt in 4 weeks   @

## 2018-04-10 NOTE — Consult Note (Signed)
Pharmacy Antibiotic Note  Shawn [name] is a 32 y.o. male admitted on 04/05/2018 with cellulitis and possible osteomyelitis.  Pharmacy has been consulted for vancomycin dosing. Patient is also receiving Meropenem.  Plan: Continue vancomycin 1000 mg IV every 24 hours.  Goal trough 15-20 mcg/mL.  Trough level ordered prior to the fifth dose. Will continue to monitor renal function and adjust dose as needed.   10/06 0300 vanc level 45. Drawn while running. Recheck tomorrow night for actual trough.  10/07 0230 vanc level 15. Continue current regimen.  Height: 5\' 6"  (167.6 cm) Weight: 125 lb (56.7 kg) IBW/kg (Calculated) : 63.8  Temp (24hrs), Avg:98.2 F (36.8 C), Min:97.5 F (36.4 C), Max:99.1 F (37.3 C)  Recent Labs  Lab 04/05/18 1427 04/05/18 1739 04/06/18 0428 04/07/18 0623 04/08/18 0355 04/09/18 0340 04/10/18 0225  WBC 20.8* 18.7* 16.9* 18.2* 16.2* 13.0*  --   CREATININE 1.93* 1.63* 1.62* 1.52*  --   --   --   LATICACIDVEN 1.3  --   --   --   --   --   --   VANCOTROUGH  --   --   --   --   --  45* 15    Estimated Creatinine Clearance: 57 mL/min (A) (by C-G formula based on SCr of 1.52 mg/dL (H)).    Allergies  Allergen Reactions  . Banana Hives, Nausea And Vomiting and Rash  . Keflex [Cephalexin] Rash    No swelling- also taken penicillin without any issue.  . Onion Hives, Nausea And Vomiting and Rash  . Sulfa Antibiotics Anaphylaxis  . Grapeseed Extract [Nutritional Supplements] Itching  . Shellfish Allergy Hives    "ALL SEAFOOD"    Antimicrobials this admission: Aztreonam 10/02 >> 10/3 Vancomycin 10/02 >>  Meropenem 10/3>>  Microbiology results: 10/02 BCx: pending 10/03 aerobic/anaerobic Cx: abundant GPC  Thank you for allowing pharmacy to be a part of this patient's care.  Eloise Harman, PharmD, BCPS Clinical Pharmacist 04/10/2018 3:11 AM

## 2018-04-10 NOTE — Progress Notes (Signed)
Inpatient Diabetes Program Recommendations  AACE/ADA: New Consensus Statement on Inpatient Glycemic Control (2015)  Target Ranges:  Prepandial:   less than 140 mg/dL      Peak postprandial:   less than 180 mg/dL (1-2 hours)      Critically ill patients:  140 - 180 mg/dL   Lab Results  Component Value Date   GLUCAP 200 (H) 04/10/2018   HGBA1C 12.9 (H) 04/05/2018    Review of Glycemic ControlResults for Shawn [name], Shawn [name] (MRN [id number]) as of 04/10/2018 08:48  Ref. Range 04/09/2018 06:54 04/09/2018 09:51 04/09/2018 11:51 04/09/2018 12:15 04/09/2018 16:33 04/09/2018 21:48 04/10/2018 08:09  Glucose-Capillary Latest Ref Range: 70 - 99 mg/dL 70 71 63 (L) 87 168 (H) 252 (H) 200 (H)    Diabetes history: Type 1 DM  Outpatient Diabetes medications: Lantus 35 units bid, Novolin R 0-10 units tid with meals Current orders for Inpatient glycemic control:  Novolog sensitive tid with meals and HS, Lantus 20 units bid Inpatient Diabetes Program Recommendations:   Please consider reducing Lantus to 25 units q HS. Also please consider adding Novolog 3 units tid with meals (hold if patient eats less than 50%).  Text page sent.   Thanks,  Adah Perl, RN, BC-ADM Inpatient Diabetes Coordinator Pager [phone] (8a-5p)

## 2018-04-10 NOTE — Progress Notes (Signed)
1 Day Post-Op   Subjective/Chief Complaint: Patient seen.  Still having significant pain in his left foot.   Objective: Vital signs in last 24 hours: Temp:  [97.9 F (36.6 C)-99.1 F (37.3 C)] 98.1 F (36.7 C) (10/07 0809) Pulse Rate:  [84-102] 84 (10/07 0809) Resp:  [17-18] 18 (10/07 0809) BP: (116-140)/(77-94) 116/78 (10/07 0809) SpO2:  [98 %-100 %] 100 % (10/07 0809) Last BM Date: 04/09/18  Intake/Output from previous day: 10/06 0701 - 10/07 0700 In: 720 [P.O.:720] Out: 1150 [Urine:1150] Intake/Output this shift: Total I/O In: 360 [P.O.:360] Out: 500 [Urine:500]  The bandage is dry and intact.  Moderate to heavy bleeding is noted on the bandaging.  Upon removal Mepitel and staples in tact and erythema and edema improved with no evidence of any additional necrosis around the incision areas.  Lab Results:  Recent Labs    04/08/18 0355 04/09/18 0340  WBC 16.2* 13.0*  HGB 7.8* 8.8*  HCT 23.5* 26.5*  PLT 331 376   BMET No results for input(s): NA, K, CL, CO2, GLUCOSE, BUN, CREATININE, CALCIUM in the last 72 hours. PT/INR No results for input(s): LABPROT, INR in the last 72 hours. ABG No results for input(s): PHART, HCO3 in the last 72 hours.  Invalid input(s): PCO2, PO2  Studies/Results: No results found.  Anti-infectives: Anti-infectives (From admission, onward)   Start     Dose/Rate Route Frequency Ordered Stop   04/08/18 1715  Ampicillin-Sulbactam (UNASYN) 3 g in sodium chloride 0.9 % 100 mL IVPB     3 g 200 mL/hr over 30 Minutes Intravenous Every 6 hours 04/08/18 1710     04/06/18 1841  vancomycin (VANCOCIN) powder  Status:  Discontinued       As needed 04/06/18 1842 04/06/18 1920   04/06/18 1800  meropenem (MERREM) 1 g in sodium chloride 0.9 % 100 mL IVPB  Status:  Discontinued     1 g 200 mL/hr over 30 Minutes Intravenous Every 8 hours 04/06/18 1740 04/08/18 1709   04/06/18 0856  vancomycin (VANCOCIN) IVPB 1000 mg/200 mL premix  Status:  Discontinued      1,000 mg 200 mL/hr over 60 Minutes Intravenous 60 min pre-op 04/06/18 0856 04/06/18 1432   04/06/18 0330  vancomycin (VANCOCIN) IVPB 1000 mg/200 mL premix  Status:  Discontinued     1,000 mg 200 mL/hr over 60 Minutes Intravenous Every 24 hours 04/05/18 1903 04/10/18 1007   04/06/18 0000  aztreonam (AZACTAM) 2 g in sodium chloride 0.9 % 100 mL IVPB  Status:  Discontinued     2 g 200 mL/hr over 30 Minutes Intravenous Every 8 hours 04/05/18 1903 04/06/18 1740   04/05/18 1515  vancomycin (VANCOCIN) IVPB 1000 mg/200 mL premix     1,000 mg 200 mL/hr over 60 Minutes Intravenous  Once 04/05/18 1513 04/05/18 1721   04/05/18 1515  aztreonam (AZACTAM) 2 g in sodium chloride 0.9 % 100 mL IVPB     2 g 200 mL/hr over 30 Minutes Intravenous  Once 04/05/18 1513 04/05/18 1620      Assessment/Plan: s/p Procedure(s): AMPUTATION RAY (Left) Assessment: Stable status post I&D with ray resection left foot.   Plan: Dry sterile dressing reapplied to the left foot.  If he remains stable over the next day or 2 I would expect that he should be able to be discharged with a PICC line for IV antibiotics for the next 6 weeks and switched over to a wound VAC.  We will reobtain a CBC for tomorrow.  LOS: 5 days    [name] 04/10/2018

## 2018-04-10 NOTE — Progress Notes (Signed)
PHARMACY CONSULT NOTE FOR:  OUTPATIENT  PARENTERAL ANTIBIOTIC THERAPY (OPAT)  Indication: osteomyelitis Regimen: Unasyn End date: 05/19/18  IV antibiotic discharge orders are pended. To discharging provider:  please sign these orders via discharge navigator,  Select New Orders & click on the button choice - Manage This Unsigned Work.     Thank you for allowing pharmacy to be a part of this patient's care.  Dallie Piles, PharmD 04/10/2018, 4:51 PM

## 2018-04-11 ENCOUNTER — Ambulatory Visit
Admission: RE | Admit: 2018-04-11 | Discharge: 2018-04-11 | Disposition: A | Discharge status: Home / Self Care | Payer: Medicaid Other | Source: Ambulatory Visit | Attending: Internal Medicine | Admitting: Internal Medicine

## 2018-04-11 LAB — AEROBIC/ANAEROBIC CULTURE W GRAM STAIN (SURGICAL/DEEP WOUND)

## 2018-04-11 LAB — CBC WITH DIFFERENTIAL/PLATELET
BLASTS: 0 %
Band Neutrophils: 0 %
Basophils Absolute: 0 10*3/uL (ref 0–0.1)
Basophils Relative: 0 %
Eosinophils Absolute: 0.4 10*3/uL (ref 0–0.7)
Eosinophils Relative: 3 %
HEMATOCRIT: 21.8 % — AB (ref 40.0–52.0)
HEMOGLOBIN: 7.1 g/dL — AB (ref 13.0–18.0)
LYMPHS PCT: 12 %
Lymphs Abs: 1.5 10*3/uL (ref 1.0–3.6)
MCH: 27 pg (ref 26.0–34.0)
MCHC: 32.6 g/dL (ref 32.0–36.0)
MCV: 83 fL (ref 80.0–100.0)
MONOS PCT: 6 %
Metamyelocytes Relative: 2 %
Monocytes Absolute: 0.7 10*3/uL (ref 0.2–1.0)
Myelocytes: 1 %
NEUTROS ABS: 9.7 10*3/uL — AB (ref 1.4–6.5)
Neutrophils Relative %: 76 %
OTHER: 0 %
Platelets: 405 10*3/uL (ref 150–440)
Promyelocytes Relative: 0 %
RBC: 2.63 MIL/uL — ABNORMAL LOW (ref 4.40–5.90)
RDW: 13.8 % (ref 11.5–14.5)
WBC: 12.3 10*3/uL — ABNORMAL HIGH (ref 3.8–10.6)
nRBC: 0 /100 WBC

## 2018-04-11 LAB — GLUCOSE, CAPILLARY
Glucose-Capillary: 160 mg/dL — ABNORMAL HIGH (ref 70–99)
Glucose-Capillary: 157 mg/dL — ABNORMAL HIGH (ref 70–99)
Glucose-Capillary: 131 mg/dL — ABNORMAL HIGH (ref 70–99)

## 2018-04-11 LAB — AEROBIC/ANAEROBIC CULTURE (SURGICAL/DEEP WOUND)

## 2018-04-11 LAB — SURGICAL PATHOLOGY

## 2018-04-11 MED ORDER — AMPICILLIN-SULBACTAM IV (FOR PTA / DISCHARGE USE ONLY): 12.0000 g | Freq: Every day | INTRAVENOUS | 0 refills | Status: AC

## 2018-04-11 MED ORDER — NOVOLIN R RELION 100 UNIT/ML IJ SOLN: 0.0000 [IU] | Freq: Three times a day (TID) | INTRAMUSCULAR | 0 refills | Status: DC | PRN

## 2018-04-11 MED ORDER — DIPHENOXYLATE-ATROPINE 2.5-0.025 MG PO TABS: 1.0000 | ORAL_TABLET | Freq: Four times a day (QID) | ORAL | 1 refills | Status: DC | PRN

## 2018-04-11 MED ORDER — ZOLPIDEM TARTRATE 5 MG PO TABS: 5.0000 mg | ORAL_TABLET | Freq: Every evening | ORAL | 0 refills | Status: DC | PRN

## 2018-04-11 MED ORDER — INSULIN GLARGINE 100 UNIT/ML ~~LOC~~ SOLN: 20.0000 [IU] | Freq: Every day | SUBCUTANEOUS | 11 refills | Status: DC

## 2018-04-11 MED ORDER — DOCUSATE SODIUM 100 MG PO CAPS: 100.0000 mg | ORAL_CAPSULE | Freq: Every day | ORAL | Status: DC

## 2018-04-11 MED ORDER — OXYCODONE-ACETAMINOPHEN 5-325 MG PO TABS: 1.0000 | ORAL_TABLET | ORAL | 0 refills | Status: DC | PRN

## 2018-04-11 NOTE — Discharge Instructions (Signed)
OPAT Orders Discharge antibiotics: Unasyn 12 grams continuous infusion  every 24 hrs Duration is 6 weeks  End Date:05/19/18   PIC Care Per Protocol:  Labs weekly while on IV antibiotics: _X_ CBC with differential __X CMP _X_ CRP X__ ESR  __ Please pull PIC at completion of IV antibiotics  __ Please leave PIC in place until doctor has seen patient or been notified  Fax weekly labs to [phone]  Call with critical value or any questions [phone]  Clinic Follow Up Appt in 4 weeks

## 2018-04-11 NOTE — Care Management (Signed)
Updated po plaatient and primary nurse that [facility] will come to the unit at 5p or later to apply the infusion pump.  Patient and primary nurse verbalize understanding not to leave until this has been done.  Patient's friend is to transport home at that time.

## 2018-04-11 NOTE — Care Management (Signed)
Informed by [facility] that agency has the referral for long term IV antibiotic therapy for this patient. Informed that if patient discharges today, patient/caregiver  will have to be taught after 5pm today due to agency staff prior engagements.

## 2018-04-11 NOTE — Progress Notes (Signed)
2 Days Post-Op   Subjective/Chief Complaint: Patient seen.  Still having some significant pain in the left foot but may be a little better.   Objective: Vital signs in last 24 hours: Temp:  [97.7 F (36.5 C)-98.2 F (36.8 C)] 98 F (36.7 C) (10/08 0753) Pulse Rate:  [76-89] 89 (10/08 0753) Resp:  [18-20] 18 (10/08 0753) BP: (116-127)/(79-88) 116/79 (10/08 0753) SpO2:  [98 %-100 %] 98 % (10/08 0753) Last BM Date: 04/09/18  Intake/Output from previous day: 10/07 0701 - 10/08 0700 In: 840 [P.O.:840] Out: 800 [Urine:800] Intake/Output this shift: Total I/O In: 510 [P.O.:510] Out: -   Moderate bleeding still noted on the bandaging.  No significant signs of purulence.  Cellulitis significantly improved and skin edges all appear viable.  Lab Results:  Recent Labs    04/09/18 0340 04/11/18 0532  WBC 13.0* 12.3*  HGB 8.8* 7.1*  HCT 26.5* 21.8*  PLT 376 405   BMET No results for input(s): NA, K, CL, CO2, GLUCOSE, BUN, CREATININE, CALCIUM in the last 72 hours. PT/INR No results for input(s): LABPROT, INR in the last 72 hours. ABG No results for input(s): PHART, HCO3 in the last 72 hours.  Invalid input(s): PCO2, PO2  Studies/Results: [place] Ekg Site Rite  Result Date: 04/10/2018 If Site Rite image not attached, placement could not be confirmed due to current cardiac rhythm.   Anti-infectives: Anti-infectives (From admission, onward)   Start     Dose/Rate Route Frequency Ordered Stop   04/11/18 0000  ampicillin-sulbactam (UNASYN) IVPB     12 g Intravenous Daily 04/11/18 1231 05/20/18 2359   04/08/18 1715  Ampicillin-Sulbactam (UNASYN) 3 g in sodium chloride 0.9 % 100 mL IVPB     3 g 200 mL/hr over 30 Minutes Intravenous Every 6 hours 04/08/18 1710     04/06/18 1841  vancomycin (VANCOCIN) powder  Status:  Discontinued       As needed 04/06/18 1842 04/06/18 1920   04/06/18 1800  meropenem (MERREM) 1 g in sodium chloride 0.9 % 100 mL IVPB  Status:  Discontinued     1  g 200 mL/hr over 30 Minutes Intravenous Every 8 hours 04/06/18 1740 04/08/18 1709   04/06/18 0856  vancomycin (VANCOCIN) IVPB 1000 mg/200 mL premix  Status:  Discontinued     1,000 mg 200 mL/hr over 60 Minutes Intravenous 60 min pre-op 04/06/18 0856 04/06/18 1432   04/06/18 0330  vancomycin (VANCOCIN) IVPB 1000 mg/200 mL premix  Status:  Discontinued     1,000 mg 200 mL/hr over 60 Minutes Intravenous Every 24 hours 04/05/18 1903 04/10/18 1007   04/06/18 0000  aztreonam (AZACTAM) 2 g in sodium chloride 0.9 % 100 mL IVPB  Status:  Discontinued     2 g 200 mL/hr over 30 Minutes Intravenous Every 8 hours 04/05/18 1903 04/06/18 1740   04/05/18 1515  vancomycin (VANCOCIN) IVPB 1000 mg/200 mL premix     1,000 mg 200 mL/hr over 60 Minutes Intravenous  Once 04/05/18 1513 04/05/18 1721   04/05/18 1515  aztreonam (AZACTAM) 2 g in sodium chloride 0.9 % 100 mL IVPB     2 g 200 mL/hr over 30 Minutes Intravenous  Once 04/05/18 1513 04/05/18 1620      Assessment/Plan: s/p Procedure(s): AMPUTATION RAY (Left) Assessment: Stable status post ray resections and debridement left foot.   Plan: Dressing was removed and a wound VAC applied to the left foot.  Patient should be stable for discharge with his IV antibiotics through his PICC  line and home health care to manage his wound VAC every few days.  Return to clinic for follow-up in a couple of weeks.  LOS: 6 days    [name] 04/11/2018

## 2018-04-11 NOTE — Discharge Summary (Signed)
[facility] NAME: Shawn [name]    MR#:  [id number]  DATE OF BIRTH:  1985/08/28  DATE OF ADMISSION:  04/05/2018 ADMITTING PHYSICIAN: Dustin Flock, MD  DATE OF DISCHARGE: 04/11/2018  PRIMARY CARE PHYSICIAN: Valera Castle, MD    ADMISSION DIAGNOSIS:  Cellulitis of foot [L03.119] Hyperglycemia [R73.9] Non-intractable vomiting with nausea, unspecified vomiting type [R11.2] Osteomyelitis of left foot, unspecified type ([place]) [M86.9] Sepsis with acute renal failure without septic shock, due to unspecified organism, unspecified acute renal failure type ([place]) [A41.9, R65.20, N17.9]  DISCHARGE DIAGNOSIS:  Active Problems:   Left foot infection   SECONDARY DIAGNOSIS:   Past Medical History:  Diagnosis Date  . Diabetes mellitus without complication ([place])   . GERD (gastroesophageal reflux disease)   . IBS (irritable bowel syndrome)     HOSPITAL COURSE:   32 year old male with past medical history significant for type 1 diabetes mellitus, GERD, IBS presents to hospital secondary to worsening left foot swelling and ulcer.  1.  Left foot cellulitis with ulcer and osteomyelitis -she was admitted to the hospital and started on broad-spectrum IV antibiotics with vancomycin, Zosyn.  A podiatry consult was obtained. - A MRI of the patient's left foot was done which showed osteomyelitis including the fourth and fifth toe including metatarsal area. - Podiatry ended up doing both fifth and fourth ray toe amputations along with debridement of the patient's wound.   - Patient's white cell count has trended down, he has had no fever and he has been hemodynamically stable.  Infectious disease consult was obtained and they recommended long-term IV antibiotics for 6 weeks.  A PICC line has been placed.  Patient's antibiotics have been narrowed down to just IV Unasyn.  Patient is being discharged on IV Unasyn for additional 6 weeks and to finish  antibiotics on May 19, 2018. -Patient is going home with a wound VAC with close follow-up with podiatry as an outpatient.  He is being arranged for home health services for antibiotics at home.  2.  Type 1 diabetes mellitus-patient's blood sugars have been labile in the hospital.  He initially was hyperglycemic but now his blood sugars have been somewhat on the lower side.  Diabetes coordinator consult was obtained.  Patient's Lantus dose has been lowered to just 20 units at bedtime.  He will continue his NovoLog sliding scale.  3.  Depression-  He will cont. Prozac  4.  Acute renal failure on CKD stage III-Baseline creatinine seems to be around 1.5. - this has improved w/ hydration and is at  baseline.    5.  Anemia of chronic disease-his hemoglobin remained stable, he did not require any transfusions and his hemoglobin can be further followed as an outpatient.  6. Anxiety - he will cont. Xanax.   7.  Diarrhea-patient has developed this secondary to being on long-term antibiotics.  Imodium has not been helping, patient was discharged on some Lomotil as needed.  Patient's C. difficile toxin assay was negative.  8.  Essential hypertension-patient will continue his lisinopril.  DISCHARGE CONDITIONS:   Stable  CONSULTS OBTAINED:  Treatment Team:  Sharlotte Alamo, DPM Tsosie Billing, MD  DRUG ALLERGIES:   Allergies  Allergen Reactions  . Banana Hives, Nausea And Vomiting and Rash  . Keflex [Cephalexin] Rash    No swelling- also taken penicillin without any issue.  . Onion Hives, Nausea And Vomiting and Rash  . Sulfa Antibiotics Anaphylaxis  . Grapeseed Extract [Nutritional  Supplements] Itching  . Shellfish Allergy Hives    "ALL SEAFOOD"    DISCHARGE MEDICATIONS:   Allergies as of 04/11/2018      Reactions   Banana Hives, Nausea And Vomiting, Rash   Keflex [cephalexin] Rash   No swelling- also taken penicillin without any issue.   Onion Hives, Nausea And  Vomiting, Rash   Sulfa Antibiotics Anaphylaxis   Grapeseed Extract [nutritional Supplements] Itching   Shellfish Allergy Hives   "ALL SEAFOOD"      Medication List    STOP taking these medications   ciprofloxacin 750 MG tablet Commonly known as:  CIPRO     TAKE these medications   ALPRAZolam 0.25 MG tablet Commonly known as:  XANAX Take 1 tablet (0.25 mg total) by mouth 3 (three) times daily as needed for anxiety.   ampicillin-sulbactam  IVPB Commonly known as:  UNASYN Inject 12 g into the vein daily. As continuous infusion Indication:  Diabetic foot/osteomyelitis Last Day of Therapy: 05/19/18 Labs - Once weekly:  CBC/D, CMP, CRP, ESR   diphenoxylate-atropine 2.5-0.025 MG tablet Commonly known as:  LOMOTIL Take 1 tablet by mouth 4 (four) times daily as needed for diarrhea or loose stools.   feeding supplement (GLUCERNA SHAKE) Liqd Take 237 mLs by mouth 3 (three) times daily between meals.   ferrous sulfate 325 (65 FE) MG tablet Take 1 tablet (325 mg total) by mouth 2 (two) times daily with a meal.   FIFTY50 GLUCOSE METER 2.0 w/Device Kit Use as directed   FLUoxetine 20 MG capsule Commonly known as:  PROZAC Take 1 capsule (20 mg total) by mouth daily. What changed:  when to take this   insulin glargine 100 UNIT/ML injection Commonly known as:  LANTUS Inject 0.2 mLs (20 Units total) into the skin at bedtime. What changed:    how much to take  when to take this   Insulin Syringes (Disposable) U-100 0.3 ML Misc 1 Syringe by Does not apply route 4 (four) times daily -  with meals and at bedtime.   lisinopril 10 MG tablet Commonly known as:  PRINIVIL,ZESTRIL Take 1 tablet (10 mg total) by mouth daily. What changed:  when to take this   metoCLOPramide 10 MG tablet Commonly known as:  REGLAN Take 1 tablet (10 mg total) by mouth 3 (three) times daily with meals.   multivitamin Tabs tablet Take 1 tablet by mouth at bedtime.   NOVOLIN R RELION 100 units/mL  injection Generic drug:  insulin regular Inject 0-0.1 mLs (0-10 Units total) into the skin 3 (three) times daily as needed for high blood sugar (sliding scale).   oxyCODONE-acetaminophen 5-325 MG tablet Commonly known as:  PERCOCET/ROXICET Take 1 tablet by mouth every 4 (four) hours as needed for moderate pain.   promethazine 25 MG tablet Commonly known as:  PHENERGAN Take 12.5 mg by mouth every 8 (eight) hours as needed for nausea or vomiting.   zolpidem 5 MG tablet Commonly known as:  AMBIEN Take 1 tablet (5 mg total) by mouth at bedtime as needed for sleep.            Home Infusion Instuctions  (From admission, onward)         Start     Ordered   04/11/18 0000  Home infusion instructions [facility] May follow [place] Dosing Protocol; May administer Cathflo as needed to maintain patency of vascular access device.; Flushing of vascular access device: per [facility] Protocol: 0.9% NaCl pre/post medica.Marland KitchenMarland Kitchen  Question Answer Comment  Instructions May follow [place] Dosing Protocol   Instructions May administer Cathflo as needed to maintain patency of vascular access device.   Instructions Flushing of vascular access device: per [facility] Protocol: 0.9% NaCl pre/post medication administration and prn patency; Heparin 100 u/ml, 64m for implanted ports and Heparin 10u/ml, 532mfor all other central venous catheters.   Instructions May follow [facility] Anaphylaxis Protocol for First Dose Administration in the home: 0.9% NaCl at 25-50 ml/hr to maintain IV access for protocol meds. Epinephrine 0.3 ml IV/IM PRN and Benadryl 25-50 IV/IM PRN s/s of anaphylaxis.   Instructions [facility] Infusion Coordinator (RN) to assist per patient IV care needs in the home PRN.      04/11/18 1231            DISCHARGE INSTRUCTIONS:   DIET:  Cardiac diet and Diabetic diet  DISCHARGE CONDITION:  Stable  ACTIVITY:  Activity as tolerated  OXYGEN:  Home Oxygen: No.   Oxygen Delivery:  room air  [facility]   If you experience worsening of your admission symptoms, develop shortness of breath, life threatening emergency, suicidal or homicidal thoughts you must seek medical attention immediately by calling 911 or calling your MD immediately  if symptoms less severe.  You Must read complete instructions/literature along with all the possible adverse reactions/side effects for all the Medicines you take and that have been prescribed to you. Take any new Medicines after you have completely understood and accpet all the possible adverse reactions/side effects.   Please note  You were cared for by a hospitalist during your hospital stay. If you have any questions about your discharge medications or the care you received while you were in the hospital after you are discharged, you can call the unit and asked to speak with the hospitalist on call if the hospitalist that took care of you is not available. Once you are discharged, your primary care physician will handle any further medical issues. Please note that NO REFILLS for any discharge medications will be authorized once you are discharged, as it is imperative that you return to your primary care physician (or establish a relationship with a primary care physician if you do not have one) for your aftercare needs so that they can reassess your need for medications and monitor your lab values.     Today   No acute events overnight.  Dressing changed by Podiatry and wound vac placed.  PICC line placed. BS stable. Will d/c home today on IV abx and outpatient follow up.   VITAL SIGNS:  Blood pressure 134/79, pulse 80, temperature 97.8 F (36.6 C), temperature source Oral, resp. rate 20, height '5\' 6"'$  (1.676 m), weight 56.7 kg, SpO2 99 %.  I/O:    Intake/Output Summary (Last 24 hours) at 04/11/2018 1722 Last data filed at 04/11/2018 1300 Gross per 24 hour  Intake 4206.76 ml  Output 700  ml  Net 3506.76 ml    PHYSICAL EXAMINATION:   GENERAL:  [age].o.-year-old thin patient lying in the bed with no acute distress.  EYES: Pupils equal, round, reactive to light and accommodation. No scleral icterus. Extraocular muscles intact.  HEENT: Head atraumatic, normocephalic. Oropharynx and nasopharynx clear.  NECK:  Supple, no jugular venous distention. No thyroid enlargement, no tenderness.  LUNGS: Normal breath sounds bilaterally, no wheezing, rales,rhonchi or crepitation. No use of accessory muscles of respiration.  CARDIOVASCULAR: S1, S2 normal. No murmurs, rubs, or  gallops.  ABDOMEN: Soft, nontender, nondistended. Bowel sounds present. No organomegaly or mass.  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  Left foot dressing in place covering the whole foot. NEUROLOGIC: Cranial nerves II through XII are intact. No focal motor or sensory deficits b/l.  PSYCHIATRIC: The patient is alert and oriented x 3.  SKIN: No obvious rash, lesion, or ulcer.   DATA REVIEW:   CBC Recent Labs  Lab 04/11/18 0532  WBC 12.3*  HGB 7.1*  HCT 21.8*  PLT 405    Chemistries  Recent Labs  Lab 04/05/18 1427  04/07/18 0623  NA 132*   < > 136  K 4.4   < > 4.0  CL 95*   < > 104  CO2 22   < > 24  GLUCOSE 478*   < > 329*  BUN 33*   < > 32*  CREATININE 1.93*   < > 1.52*  CALCIUM 9.0   < > 8.3*  AST 10*  --   --   ALT 9  --   --   ALKPHOS 93  --   --   BILITOT 0.9  --   --    < > = values in this interval not displayed.    Cardiac Enzymes Recent Labs  Lab 04/05/18 1739  TROPONINI <0.03    Microbiology Results  Results for orders placed or performed during the hospital encounter of 04/05/18  Blood Culture (routine x 2)     Status: None   Collection Time: 04/05/18  3:12 PM  Result Value Ref Range Status   Specimen Description BLOOD BLOOD LEFT FOREARM  Final   Special Requests   Final    BOTTLES DRAWN AEROBIC AND ANAEROBIC Blood Culture adequate volume    Culture   Final    NO GROWTH 5 DAYS Performed at [facility], [address] [place], [place] [address]    Report Status 04/10/2018 FINAL  Final  Blood Culture (routine x 2)     Status: None   Collection Time: 04/05/18  3:18 PM  Result Value Ref Range Status   Specimen Description BLOOD RIGHT ANTECUBITAL  Final   Special Requests   Final    BOTTLES DRAWN AEROBIC AND ANAEROBIC Blood Culture results may not be optimal due to an excessive volume of blood received in culture bottles   Culture   Final    NO GROWTH 5 DAYS Performed at [facility], [address] [place], [place] [address]    Report Status 04/10/2018 FINAL  Final  Aerobic/Anaerobic Culture (surgical/deep wound)     Status: None   Collection Time: 04/06/18  6:35 PM  Result Value Ref Range Status   Specimen Description   Final    TOE RIGHT FIFTH Performed at [facility], [address], [place], [place] [address]    Special Requests   Final    NONE Performed at [facility], [address] [place], [place] [address]    Gram Stain   Final    ABUNDANT WBC PRESENT, PREDOMINANTLY PMN ABUNDANT GRAM POSITIVE COCCI    Culture   Final    ABUNDANT STREPTOCOCCUS INTERMEDIUS SUSCEPTIBILITIES PERFORMED ON PREVIOUS CULTURE WITHIN THE LAST 5 DAYS. NO ANAEROBES ISOLATED Performed at [facility], [place] [address] [place], [place] [address]    Report Status 04/11/2018 FINAL  Final  Aerobic/Anaerobic Culture (surgical/deep wound)     Status: None   Collection Time: 04/06/18  6:39 PM  Result Value Ref Range Status   Specimen Description   Final    BONE  LEFT FIFTH METATARSAL Performed at [facility], [address] [place], [place] [address]    Special Requests   Final    NONE Performed at [facility], [address] [place], [place] [address]    Gram Stain   Final    FEW WBC PRESENT, PREDOMINANTLY PMN RARE GRAM POSITIVE COCCI    Culture   Final     FEW STREPTOCOCCUS INTERMEDIUS NO ANAEROBES ISOLATED Performed at [facility], [place] [address] [place], [place] [address]    Report Status 04/11/2018 FINAL  Final   Organism ID, Bacteria STREPTOCOCCUS INTERMEDIUS  Final      Susceptibility   Streptococcus intermedius - MIC*    PENICILLIN <=0.06 SENSITIVE Sensitive     CEFTRIAXONE 0.25 SENSITIVE Sensitive     ERYTHROMYCIN 4 RESISTANT Resistant     LEVOFLOXACIN 0.5 SENSITIVE Sensitive     VANCOMYCIN 0.5 SENSITIVE Sensitive     * FEW STREPTOCOCCUS INTERMEDIUS  C difficile quick scan w PCR reflex     Status: None   Collection Time: 04/08/18  4:40 PM  Result Value Ref Range Status   C Diff antigen NEGATIVE NEGATIVE Final   C Diff toxin NEGATIVE NEGATIVE Final   C Diff interpretation No C. difficile detected.  Final    Comment: Performed at [facility], [address] [place], [place] [address]    RADIOLOGY:  [place] Ekg Site Rite  Result Date: 04/10/2018 If [facility] image not attached, placement could not be confirmed due to current cardiac rhythm.     Management plans discussed with the patient, family and they are in agreement.  CODE STATUS:     Code Status Orders  (From admission, onward)         Start     Ordered   04/05/18 1725  Full code  Continuous     04/05/18 1724        TOTAL TIME TAKING CARE OF THIS PATIENT: 45 minutes.    Henreitta Leber M.D on 04/11/2018 at 5:22 PM  Between 7am to 6pm - Pager - [phone]  After 6pm go to [email or web address] - [employer] [facility]  Office  [phone]  CC: Primary care physician; Valera Castle, MD

## 2018-04-12 ENCOUNTER — Ambulatory Visit: Admit: 2018-04-12 | Payer: Medicaid Other | Admitting: Vascular Surgery

## 2018-04-12 SURGERY — LOWER EXTREMITY ANGIOGRAPHY
Anesthesia: Moderate Sedation | Laterality: Left

## 2018-04-21 ENCOUNTER — Encounter: Payer: Self-pay | Admitting: Emergency Medicine

## 2018-04-21 ENCOUNTER — Other Ambulatory Visit: Payer: Self-pay

## 2018-04-21 ENCOUNTER — Emergency Department
Admission: EM | Admit: 2018-04-21 | Discharge: 2018-04-21 | Disposition: A | Discharge status: Home / Self Care | Payer: Medicaid Other | Attending: Emergency Medicine | Admitting: Emergency Medicine

## 2018-04-21 DIAGNOSIS — R531 Weakness: Secondary | ICD-10-CM | POA: Diagnosis present

## 2018-04-21 DIAGNOSIS — D649 Anemia, unspecified: Secondary | ICD-10-CM | POA: Diagnosis not present

## 2018-04-21 DIAGNOSIS — Z794 Long term (current) use of insulin: Secondary | ICD-10-CM | POA: Diagnosis not present

## 2018-04-21 DIAGNOSIS — R739 Hyperglycemia, unspecified: Secondary | ICD-10-CM

## 2018-04-21 DIAGNOSIS — Z89421 Acquired absence of other right toe(s): Secondary | ICD-10-CM | POA: Insufficient documentation

## 2018-04-21 DIAGNOSIS — M79672 Pain in left foot: Secondary | ICD-10-CM | POA: Insufficient documentation

## 2018-04-21 DIAGNOSIS — E1165 Type 2 diabetes mellitus with hyperglycemia: Secondary | ICD-10-CM | POA: Insufficient documentation

## 2018-04-21 DIAGNOSIS — Z89422 Acquired absence of other left toe(s): Secondary | ICD-10-CM | POA: Insufficient documentation

## 2018-04-21 DIAGNOSIS — Z79899 Other long term (current) drug therapy: Secondary | ICD-10-CM | POA: Insufficient documentation

## 2018-04-21 LAB — COMPREHENSIVE METABOLIC PANEL
ALT: 9 U/L (ref 0–44)
AST: 11 U/L — AB (ref 15–41)
Albumin: 2.7 g/dL — ABNORMAL LOW (ref 3.5–5.0)
Alkaline Phosphatase: 90 U/L (ref 38–126)
Anion gap: 8 (ref 5–15)
BUN: 9 mg/dL (ref 6–20)
CHLORIDE: 99 mmol/L (ref 98–111)
CO2: 29 mmol/L (ref 22–32)
CREATININE: 1.37 mg/dL — AB (ref 0.61–1.24)
Calcium: 8.5 mg/dL — ABNORMAL LOW (ref 8.9–10.3)
GFR calc Af Amer: >60 mL/min (ref >60)
Glucose, Bld: 380 mg/dL — ABNORMAL HIGH (ref 70–99)
Potassium: 3.9 mmol/L (ref 3.5–5.1)
Sodium: 136 mmol/L (ref 135–145)
Total Bilirubin: 0.4 mg/dL (ref 0.3–1.2)
Total Protein: 7 g/dL (ref 6.5–8.1)

## 2018-04-21 LAB — CBC
HEMATOCRIT: 23.9 % — AB (ref 39.0–52.0)
Hemoglobin: 7.7 g/dL — ABNORMAL LOW (ref 13.0–17.0)
MCH: 26.8 pg (ref 26.0–34.0)
MCHC: 32.2 g/dL (ref 30.0–36.0)
MCV: 83.3 fL (ref 80.0–100.0)
Platelets: 418 10*3/uL — ABNORMAL HIGH (ref 150–400)
RBC: 2.87 MIL/uL — ABNORMAL LOW (ref 4.22–5.81)
RDW: 13.7 % (ref 11.5–15.5)
WBC: 9.2 10*3/uL (ref 4.0–10.5)
nRBC: 0 % (ref 0.0–0.2)

## 2018-04-21 LAB — TYPE AND SCREEN
ABO/RH(D): A NEG
Antibody Screen: NEGATIVE

## 2018-04-21 LAB — PROTIME-INR
INR: 0.95
Prothrombin Time: 12.6 seconds (ref 11.4–15.2)

## 2018-04-21 MED ORDER — INSULIN ASPART 100 UNIT/ML ~~LOC~~ SOLN
8.0000 [IU] | Freq: Once | SUBCUTANEOUS | Status: AC
  Administered 2018-04-21: 8 [IU] via SUBCUTANEOUS
  Filled 2018-04-21: qty 1

## 2018-04-21 MED ORDER — MORPHINE SULFATE (PF) 4 MG/ML IV SOLN
4.0000 mg | Freq: Once | INTRAVENOUS | Status: AC
  Administered 2018-04-21: 4 mg via INTRAVENOUS
  Filled 2018-04-21: qty 1

## 2018-04-21 MED ORDER — ONDANSETRON HCL 4 MG/2ML IJ SOLN
4.0000 mg | Freq: Once | INTRAMUSCULAR | Status: AC
  Administered 2018-04-21: 4 mg via INTRAVENOUS
  Filled 2018-04-21: qty 2

## 2018-04-21 MED ORDER — PERCOCET 5-325 MG PO TABS: 1.0000 | ORAL_TABLET | Freq: Four times a day (QID) | ORAL | 0 refills | Status: AC | PRN

## 2018-04-21 NOTE — Discharge Instructions (Addendum)
Your hemoglobin is 7.7 which is low but consistent with your prior labs.  Today take your regular medications as prescribed.  Follow-up with your regular doctor and visiting nurse as scheduled.  Return to the ER for new, worsening, or persistent weakness, vomiting, fever, worsening pain to the leg, or any other new or worsening symptoms that concern you.

## 2018-04-21 NOTE — ED Provider Notes (Signed)
[facility] Emergency Department Provider Note ____________________________________________   First MD Initiated Contact with Patient 04/21/18 1840     (approximate)  I have reviewed the triage vital signs and the nursing notes.   HISTORY  Chief Complaint Anemia    HPI Shawn [name] is a 32 y.o. 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  The patient also reports persistent pain to the left foot but no fevers, chest pain or difficulty breathing, or vomiting.  He does report some diarrhea.  He also reports that his glucose has been elevated.  Past Medical History:  Diagnosis Date  . Diabetes mellitus without complication ([place])   . GERD (gastroesophageal reflux disease)   . IBS (irritable bowel syndrome)     Patient Active Problem List   Diagnosis Date Noted  . Left foot infection 04/05/2018  . Foot infection 02/06/2018  . Osteomyelitis ([place]) 01/10/2018  . Cellulitis 12/29/2017  . Sepsis ([place]) 12/18/2017  . Moderate recurrent major depression ([place]) 11/10/2017  . Type 2 diabetes mellitus with hyperosmolar nonketotic hyperglycemia ([place]) 11/09/2017  . History of migraine 07/15/2017  . IBS (irritable bowel syndrome) 07/15/2017  . Protein-calorie malnutrition, severe 07/14/2017  . Abdominal pain 07/13/2017  . GERD (gastroesophageal reflux disease) 12/30/2009  . Diabetes ([place]) 11/25/2009  . MDD (major depressive disorder) 11/25/2009  . Hypercholesterolemia 03/07/2008    Past Surgical History:  Procedure Laterality Date  . ABDOMINAL AORTOGRAM W/LOWER EXTREMITY Right 12/23/2017   Procedure: ABDOMINAL AORTOGRAM W/LOWER EXTREMITY;  Surgeon: Katha Cabal, MD;  Location: [facility];   Service: Cardiovascular;  Laterality: Right;  . AMPUTATION Right 12/24/2017   Procedure: AMPUTATION RAY;  Surgeon: Sharlotte Alamo, DPM;  Location: [facility];  Service: Podiatry;  Laterality: Right;  . AMPUTATION Left 04/06/2018   Procedure: AMPUTATION RAY;  Surgeon: Sharlotte Alamo, DPM;  Location: [facility];  Service: Podiatry;  Laterality: Left;  . AMPUTATION Left 04/09/2018   Procedure: AMPUTATION RAY;  Surgeon: Sharlotte Alamo, DPM;  Location: [facility];  Service: Podiatry;  Laterality: Left;  . APPLICATION OF WOUND VAC Right 12/24/2017   Procedure: APPLICATION OF WOUND VAC;  Surgeon: Sharlotte Alamo, DPM;  Location: [facility];  Service: Podiatry;  Laterality: Right;  . APPLICATION OF WOUND VAC Right 01/01/2018   Procedure: APPLICATION OF WOUND VAC;  Surgeon: Albertine Patricia, DPM;  Location: [facility];  Service: Podiatry;  Laterality: Right;  . IRRIGATION AND DEBRIDEMENT FOOT Right 12/20/2017   Procedure: IRRIGATION AND DEBRIDEMENT FOOT;  Surgeon: Sharlotte Alamo, DPM;  Location: [facility];  Service: Podiatry;  Laterality: Right;  . IRRIGATION AND DEBRIDEMENT FOOT Right 12/24/2017   Procedure: IRRIGATION AND DEBRIDEMENT FOOT;  Surgeon: Sharlotte Alamo, DPM;  Location: [facility];  Service: Podiatry;  Laterality: Right;  . IRRIGATION AND DEBRIDEMENT FOOT Right 01/01/2018   Procedure: IRRIGATION AND DEBRIDEMENT FOOT-SKIN,SOFT TISSUE AND BONE;  Surgeon: Albertine Patricia, DPM;  Location: [facility];  Service: Podiatry;  Laterality: Right;  . IRRIGATION AND DEBRIDEMENT FOOT Left 04/06/2018   Procedure: IRRIGATION AND DEBRIDEMENT FOOT;  Surgeon: Sharlotte Alamo, DPM;  Location: [facility];  Service: Podiatry;  Laterality: Left;  . LOWER EXTREMITY ANGIOGRAPHY Left 04/06/2018   Procedure: Lower Extremity Angiography;  Surgeon: Algernon Huxley, MD;  Location: [facility] IN[facility]  [facility];  Service: Cardiovascular;  Laterality: Left;    Prior to Admission medications   Medication Sig Start Date End Date Taking? Authorizing Provider  ALPRAZolam (XANAX)  0.25 MG tablet Take 1 tablet (0.25 mg total) by mouth 3 (three) times daily as needed for anxiety. 12/27/17   Loletha Grayer, MD  ampicillin-sulbactam (UNASYN) IVPB Inject 12 g into the vein daily. As continuous infusion Indication:  Diabetic foot/osteomyelitis Last Day of Therapy: 05/19/18 Labs - Once weekly:  CBC/D, CMP, CRP, ESR 04/11/18 05/20/18  Henreitta Leber, MD  Blood Glucose Monitoring Suppl (FIFTY50 GLUCOSE METER 2.0) w/Device KIT Use as directed 12/29/17 12/29/18  [provider]  diphenoxylate-atropine (LOMOTIL) 2.5-0.025 MG tablet Take 1 tablet by mouth 4 (four) times daily as needed for diarrhea or loose stools. 04/11/18 04/11/19  Henreitta Leber, MD  feeding supplement, GLUCERNA SHAKE, (GLUCERNA SHAKE) LIQD Take 237 mLs by mouth 3 (three) times daily between meals. 12/27/17   Loletha Grayer, MD  ferrous sulfate 325 (65 FE) MG tablet Take 1 tablet (325 mg total) by mouth 2 (two) times daily with a meal. 01/03/18   Sainani, Belia Heman, MD  FLUoxetine (PROZAC) 20 MG capsule Take 1 capsule (20 mg total) by mouth daily. Patient taking differently: Take 20 mg by mouth at bedtime.  11/12/17   Gladstone Lighter, MD  insulin glargine (LANTUS) 100 UNIT/ML injection Inject 0.2 mLs (20 Units total) into the skin at bedtime. 04/11/18   Henreitta Leber, MD  Insulin Syringes, Disposable, U-100 0.3 ML MISC 1 Syringe by Does not apply route 4 (four) times daily -  with meals and at bedtime. 12/27/17   Loletha Grayer, MD  lisinopril (PRINIVIL,ZESTRIL) 10 MG tablet Take 1 tablet (10 mg total) by mouth daily. Patient taking differently: Take 10 mg by mouth at bedtime.  11/12/17   Gladstone Lighter, MD  metoCLOPramide (REGLAN) 10 MG tablet Take 1 tablet (10 mg total) by mouth 3 (three) times daily with meals. 03/23/18 04/22/18  Darel Hong, MD  multivitamin (ONE-A-DAY MEN'S) TABS tablet Take 1 tablet by mouth at bedtime.     [provider]  NOVOLIN R RELION 100 UNIT/ML injection Inject  0-0.1 mLs (0-10 Units total) into the skin 3 (three) times daily as needed for high blood sugar (sliding scale). 04/11/18 06/10/18  Henreitta Leber, MD  PERCOCET 5-325 MG tablet Take 1 tablet by mouth every 6 (six) hours as needed for up to 5 days for severe pain. 04/21/18 04/26/18  Arta Silence, MD  promethazine (PHENERGAN) 25 MG tablet Take 12.5 mg by mouth every 8 (eight) hours as needed for nausea or vomiting.     [provider]  zolpidem (AMBIEN) 5 MG tablet Take 1 tablet (5 mg total) by mouth at bedtime as needed for sleep. 04/11/18 05/11/18  Henreitta Leber, MD    Allergies Banana; Keflex [cephalexin]; Onion; Sulfa antibiotics; Grapeseed extract [nutritional supplements]; and Shellfish allergy  Family History  Problem Relation Age of Onset  . Diabetes Mother   . Ovarian cancer Mother     Social History Social History   Tobacco Use  . Smoking status: Never Smoker  . Smokeless tobacco: Never Used  Substance Use Topics  . Alcohol use: No    Frequency: Never  . Drug use: Yes    Types: Marijuana, PCP    Review of Systems  Constitutional: No fever. Eyes: No redness. ENT: No sore throat. Cardiovascular: Denies chest pain. Respiratory: Denies shortness of breath. Gastrointestinal: Positive for diarrhea.  Genitourinary: Negative for dysuria.  Musculoskeletal: Negative for back pain. Skin: Negative for rash. Neurological: Negative for headaches, focal weakness or numbness.   ____________________________________________   PHYSICAL EXAM:  VITAL SIGNS: ED Triage Vitals  Enc Vitals Group     BP 04/21/18 1808 117/86     Pulse Rate 04/21/18 1808 92     Resp --      Temp 04/21/18 1808 98.5 F (36.9 C)     Temp Source 04/21/18 1808 Oral     SpO2 04/21/18 1808 98 %     Weight 04/21/18 1735 125 lb (56.7 kg)     Height 04/21/18 1735 '5\' 6"'$  (1.676 m)     Head Circumference --      Peak Flow --      Pain Score 04/21/18 1734 6     Pain Loc --      Pain Edu?  --      Excl. in [place]? --     Constitutional: Alert and oriented.  Somewhat chronically ill-appearing but in no acute distress.   Eyes: Conjunctivae are normal.  Head: Atraumatic. Nose: No congestion/rhinnorhea. Mouth/Throat: Mucous membranes are slightly dry.   Neck: Normal range of motion.  Cardiovascular: Normal rate, regular rhythm. Grossly normal heart sounds.  Good peripheral circulation. Respiratory: Normal respiratory effort.  No retractions. Lungs CTAB. Gastrointestinal: No distention.  Musculoskeletal: No lower extremity edema.  Extremities warm and well perfused.  Left foot with wound VAC in place.  Dressing is clean/dry/intact.  No surrounding erythema, induration, or warmth. Neurologic:  Normal speech and language. No gross focal neurologic deficits are appreciated.  Skin:  Skin is warm and dry. No rash noted. Psychiatric: Mood and affect are normal. Speech and behavior are normal.  ____________________________________________   LABS (all labs ordered are listed, but only abnormal results are displayed)  Labs Reviewed  COMPREHENSIVE METABOLIC PANEL - Abnormal; Notable for the following components:      Result Value   Glucose, Bld 380 (*)    Creatinine, Ser 1.37 (*)    Calcium 8.5 (*)    Albumin 2.7 (*)    AST 11 (*)    All other components within normal limits  CBC - Abnormal; Notable for the following components:   RBC 2.87 (*)    Hemoglobin 7.7 (*)    HCT 23.9 (*)    Platelets 418 (*)    All other components within normal limits  PROTIME-INR  TYPE AND SCREEN   ____________________________________________  EKG   ____________________________________________  RADIOLOGY    ____________________________________________   PROCEDURES  Procedure(s) performed: No  Procedures  Critical Care performed: No ____________________________________________   INITIAL IMPRESSION / ASSESSMENT AND PLAN / ED COURSE  Pertinent labs & imaging results that were  available during my care of the patient were reviewed by me and considered in my medical decision making (see chart for details).  32 year old male with PMH as noted above presents with concern for possible worsening anemia after he had blood checked by the visiting nurse several days ago.  He also reports persistent (but not worsening) pain to his left foot amputation wound area after he ran out of Percocet.  On exam he is somewhat chronically weak appearing but not acutely ill.  He does appear somewhat pale.  I examined the left foot wound VAC site and there is no erythema or other evidence of infection.  I reviewed the past medical records in Epic; the patient was admitted earlier this month and discharged on 04/11/2018  after being diagnosed with cellulitis and having the amputations of his fourth and fifth toe.  He was discharged with continued Unasyn versus a PICC to be completed next month.  The foot pain appears to be consistent with normal postoperative pain.  The patient is chronically anemic, but we will obtain labs to rule out worsening anemia.  Differential also includes dehydration, renal insufficiency or other metabolic cause, or less likely infection.  ----------------------------------------- 7:43 PM on 04/21/2018 -----------------------------------------  The patient's work-up is reassuring.  His hemoglobin is 7.7, which is low but appears to be consistent with his range over the last 2 weeks.  His glucose is elevated but the rest of the metabolic panel is at or improved from baseline.  At this time there is no indication for transfusion or other acute intervention.  I will give some insulin to help with hyperglycemia although there is no evidence of DKA or other acute complication.  The plan will be to have his labs rechecked as scheduled.  I will give some additional analgesic to cover the patient for the next few days until he can follow with his regular doctor.  He is stable  for discharge at this time.  Return precautions given, and he expresses understanding.   ____________________________________________   FINAL CLINICAL IMPRESSION(S) / ED DIAGNOSES  Final diagnoses:  Anemia, unspecified type  Hyperglycemia      NEW MEDICATIONS STARTED DURING THIS VISIT:  New Prescriptions   PERCOCET 5-325 MG TABLET    Take 1 tablet by mouth every 6 (six) hours as needed for up to 5 days for severe pain.     Note:  This document was prepared using Dragon voice recognition software and may include unintentional dictation errors.    Arta Silence, MD 04/21/18 2004

## 2018-04-21 NOTE — ED Notes (Signed)
Pt has single lumen PICC to right upper arm with running infusion from home.

## 2018-04-21 NOTE — ED Notes (Signed)
Pt wheeled to lobby to wait on transport home.

## 2018-04-21 NOTE — ED Triage Notes (Signed)
Arrives to ED for evaluation of anemia. Patient states he had blood work drawn on Monday and HGB is 6.  Recent amputation to left foot at fourth and fifth toes and has a wound vac in place.  AAOx3.  Skin warm and dry. NAD

## 2018-04-27 ENCOUNTER — Encounter: Payer: Self-pay | Admitting: Emergency Medicine

## 2018-04-27 ENCOUNTER — Other Ambulatory Visit: Payer: Self-pay

## 2018-04-27 ENCOUNTER — Inpatient Hospital Stay
Admission: EM | Admit: 2018-04-27 | Discharge: 2018-05-02 | DRG: 683 | Disposition: A | Discharge status: Home / Self Care | Payer: Medicaid Other | Attending: Internal Medicine | Admitting: Internal Medicine

## 2018-04-27 ENCOUNTER — Observation Stay: Payer: Medicaid Other

## 2018-04-27 DIAGNOSIS — Z9889 Other specified postprocedural states: Secondary | ICD-10-CM

## 2018-04-27 DIAGNOSIS — Z89422 Acquired absence of other left toe(s): Secondary | ICD-10-CM

## 2018-04-27 DIAGNOSIS — D638 Anemia in other chronic diseases classified elsewhere: Secondary | ICD-10-CM | POA: Diagnosis present

## 2018-04-27 DIAGNOSIS — Z91013 Allergy to seafood: Secondary | ICD-10-CM

## 2018-04-27 DIAGNOSIS — K589 Irritable bowel syndrome without diarrhea: Secondary | ICD-10-CM | POA: Diagnosis present

## 2018-04-27 DIAGNOSIS — N170 Acute kidney failure with tubular necrosis: Secondary | ICD-10-CM | POA: Diagnosis present

## 2018-04-27 DIAGNOSIS — F419 Anxiety disorder, unspecified: Secondary | ICD-10-CM | POA: Diagnosis present

## 2018-04-27 DIAGNOSIS — F418 Other specified anxiety disorders: Secondary | ICD-10-CM | POA: Diagnosis present

## 2018-04-27 DIAGNOSIS — E538 Deficiency of other specified B group vitamins: Secondary | ICD-10-CM | POA: Diagnosis present

## 2018-04-27 DIAGNOSIS — M868X7 Other osteomyelitis, ankle and foot: Secondary | ICD-10-CM | POA: Diagnosis present

## 2018-04-27 DIAGNOSIS — D649 Anemia, unspecified: Secondary | ICD-10-CM

## 2018-04-27 DIAGNOSIS — Z881 Allergy status to other antibiotic agents status: Secondary | ICD-10-CM

## 2018-04-27 DIAGNOSIS — N179 Acute kidney failure, unspecified: Principal | ICD-10-CM | POA: Diagnosis present

## 2018-04-27 DIAGNOSIS — E860 Dehydration: Secondary | ICD-10-CM | POA: Diagnosis present

## 2018-04-27 DIAGNOSIS — E1165 Type 2 diabetes mellitus with hyperglycemia: Secondary | ICD-10-CM | POA: Diagnosis present

## 2018-04-27 DIAGNOSIS — Z4781 Encounter for orthopedic aftercare following surgical amputation: Secondary | ICD-10-CM

## 2018-04-27 DIAGNOSIS — E1122 Type 2 diabetes mellitus with diabetic chronic kidney disease: Secondary | ICD-10-CM | POA: Diagnosis present

## 2018-04-27 DIAGNOSIS — K219 Gastro-esophageal reflux disease without esophagitis: Secondary | ICD-10-CM | POA: Diagnosis present

## 2018-04-27 DIAGNOSIS — I129 Hypertensive chronic kidney disease with stage 1 through stage 4 chronic kidney disease, or unspecified chronic kidney disease: Secondary | ICD-10-CM | POA: Diagnosis present

## 2018-04-27 DIAGNOSIS — N183 Chronic kidney disease, stage 3 (moderate): Secondary | ICD-10-CM | POA: Diagnosis present

## 2018-04-27 DIAGNOSIS — E1169 Type 2 diabetes mellitus with other specified complication: Secondary | ICD-10-CM | POA: Diagnosis present

## 2018-04-27 DIAGNOSIS — R112 Nausea with vomiting, unspecified: Secondary | ICD-10-CM

## 2018-04-27 DIAGNOSIS — Z882 Allergy status to sulfonamides status: Secondary | ICD-10-CM

## 2018-04-27 DIAGNOSIS — D631 Anemia in chronic kidney disease: Secondary | ICD-10-CM | POA: Diagnosis present

## 2018-04-27 DIAGNOSIS — F329 Major depressive disorder, single episode, unspecified: Secondary | ICD-10-CM | POA: Diagnosis present

## 2018-04-27 DIAGNOSIS — Z79899 Other long term (current) drug therapy: Secondary | ICD-10-CM

## 2018-04-27 DIAGNOSIS — Z794 Long term (current) use of insulin: Secondary | ICD-10-CM

## 2018-04-27 LAB — COMPREHENSIVE METABOLIC PANEL
ALT: 11 U/L (ref 0–44)
AST: 10 U/L — ABNORMAL LOW (ref 15–41)
Albumin: 2.6 g/dL — ABNORMAL LOW (ref 3.5–5.0)
Alkaline Phosphatase: 87 U/L (ref 38–126)
Anion gap: 11 (ref 5–15)
BUN: 27 mg/dL — ABNORMAL HIGH (ref 6–20)
CHLORIDE: 100 mmol/L (ref 98–111)
CO2: 24 mmol/L (ref 22–32)
Calcium: 8.4 mg/dL — ABNORMAL LOW (ref 8.9–10.3)
Creatinine, Ser: 2.4 mg/dL — ABNORMAL HIGH (ref 0.61–1.24)
GFR calc Af Amer: 40 mL/min — ABNORMAL LOW (ref >60)
GFR calc non Af Amer: 35 mL/min — ABNORMAL LOW (ref >60)
Glucose, Bld: 453 mg/dL — ABNORMAL HIGH (ref 70–99)
POTASSIUM: 4.5 mmol/L (ref 3.5–5.1)
Sodium: 135 mmol/L (ref 135–145)
Total Bilirubin: 0.3 mg/dL (ref 0.3–1.2)
Total Protein: 6.5 g/dL (ref 6.5–8.1)

## 2018-04-27 LAB — GASTROINTESTINAL PANEL BY PCR, STOOL (REPLACES STOOL CULTURE)
ASTROVIRUS: NOT DETECTED
Adenovirus F40/41: NOT DETECTED
CYCLOSPORA CAYETANENSIS: NOT DETECTED
Campylobacter species: NOT DETECTED
Cryptosporidium: NOT DETECTED
ENTAMOEBA HISTOLYTICA: NOT DETECTED
Enteroaggregative E coli (EAEC): NOT DETECTED
Enteropathogenic E coli (EPEC): NOT DETECTED
Enterotoxigenic E coli (ETEC): NOT DETECTED
Giardia lamblia: NOT DETECTED
Norovirus GI/GII: NOT DETECTED
Plesimonas shigelloides: NOT DETECTED
Rotavirus A: NOT DETECTED
SAPOVIRUS (I, II, IV, AND V): NOT DETECTED
Salmonella species: NOT DETECTED
Shiga like toxin producing E coli (STEC): NOT DETECTED
Shigella/Enteroinvasive E coli (EIEC): NOT DETECTED
VIBRIO CHOLERAE: NOT DETECTED
Vibrio species: NOT DETECTED
Yersinia enterocolitica: NOT DETECTED

## 2018-04-27 LAB — URINALYSIS, COMPLETE (UACMP) WITH MICROSCOPIC
BACTERIA UA: NONE SEEN
BILIRUBIN URINE: NEGATIVE
Glucose, UA: >=500 mg/dL — AB
KETONES UR: NEGATIVE mg/dL
Leukocytes, UA: NEGATIVE
NITRITE: NEGATIVE
Protein, ur: 100 mg/dL — AB
Specific Gravity, Urine: 1.012 (ref 1.005–1.030)
Squamous Epithelial / LPF: NONE SEEN (ref 0–5)
pH: 5 (ref 5.0–8.0)

## 2018-04-27 LAB — CBC
HEMATOCRIT: 24.8 % — AB (ref 39.0–52.0)
Hemoglobin: 7.7 g/dL — ABNORMAL LOW (ref 13.0–17.0)
MCH: 26.6 pg (ref 26.0–34.0)
MCHC: 31 g/dL (ref 30.0–36.0)
MCV: 85.8 fL (ref 80.0–100.0)
NRBC: 0 % (ref 0.0–0.2)
Platelets: 314 10*3/uL (ref 150–400)
RBC: 2.89 MIL/uL — ABNORMAL LOW (ref 4.22–5.81)
RDW: 13.8 % (ref 11.5–15.5)
WBC: 9 10*3/uL (ref 4.0–10.5)

## 2018-04-27 LAB — GLUCOSE, CAPILLARY
GLUCOSE-CAPILLARY: 342 mg/dL — AB (ref 70–99)
Glucose-Capillary: 407 mg/dL — ABNORMAL HIGH (ref 70–99)
Glucose-Capillary: 361 mg/dL — ABNORMAL HIGH (ref 70–99)

## 2018-04-27 LAB — RETICULOCYTES
Immature Retic Fract: 16 % — ABNORMAL HIGH (ref 2.3–15.9)
RBC.: 2.9 MIL/uL — AB (ref 4.22–5.81)
RETIC CT PCT: 2.8 % (ref 0.4–3.1)
Retic Count, Absolute: 81.8 10*3/uL (ref 19.0–186.0)

## 2018-04-27 LAB — LIPASE, BLOOD: Lipase: 38 U/L (ref 11–51)

## 2018-04-27 LAB — IRON AND TIBC
Iron: 24 ug/dL — ABNORMAL LOW (ref 45–182)
Saturation Ratios: 9 % — ABNORMAL LOW (ref 17.9–39.5)
TIBC: 281 ug/dL (ref 250–450)
UIBC: 257 ug/dL

## 2018-04-27 LAB — FOLATE: FOLATE: 3.9 ng/mL — AB (ref >5.9)

## 2018-04-27 LAB — FERRITIN: Ferritin: 51 ng/mL (ref 24–336)

## 2018-04-27 MED ORDER — HEPARIN SODIUM (PORCINE) 5000 UNIT/ML IJ SOLN
5000.0000 [IU] | Freq: Three times a day (TID) | INTRAMUSCULAR | Status: DC
  Administered 2018-04-27 – 2018-04-30 (×8): 5000 [IU] via SUBCUTANEOUS
  Filled 2018-04-27 (×8): qty 1

## 2018-04-27 MED ORDER — OXYCODONE-ACETAMINOPHEN 5-325 MG PO TABS
1.0000 | ORAL_TABLET | Freq: Four times a day (QID) | ORAL | Status: DC | PRN
  Administered 2018-04-27 – 2018-04-28 (×3): 2 via ORAL
  Filled 2018-04-27 (×3): qty 2

## 2018-04-27 MED ORDER — SODIUM CHLORIDE 0.9 % IV SOLN
Freq: Once | INTRAVENOUS | Status: AC
  Administered 2018-04-27: 11:00:00 via INTRAVENOUS

## 2018-04-27 MED ORDER — ONDANSETRON HCL 4 MG/2ML IJ SOLN
4.0000 mg | Freq: Four times a day (QID) | INTRAMUSCULAR | Status: DC | PRN
  Administered 2018-04-27 – 2018-05-01 (×2): 4 mg via INTRAVENOUS
  Filled 2018-04-27 (×3): qty 2

## 2018-04-27 MED ORDER — ONDANSETRON HCL 4 MG PO TABS: 4.0000 mg | ORAL_TABLET | Freq: Four times a day (QID) | ORAL | Status: DC | PRN

## 2018-04-27 MED ORDER — INSULIN ASPART 100 UNIT/ML ~~LOC~~ SOLN
0.0000 [IU] | Freq: Three times a day (TID) | SUBCUTANEOUS | Status: DC
  Administered 2018-04-28: 1 [IU] via SUBCUTANEOUS
  Administered 2018-04-28: 5 [IU] via SUBCUTANEOUS
  Administered 2018-04-29 – 2018-04-30 (×2): 2 [IU] via SUBCUTANEOUS
  Administered 2018-04-30: 17:00:00 1 [IU] via SUBCUTANEOUS
  Administered 2018-05-01: 17:00:00 2 [IU] via SUBCUTANEOUS
  Administered 2018-05-01: 08:00:00 1 [IU] via SUBCUTANEOUS
  Administered 2018-05-02: 2 [IU] via SUBCUTANEOUS
  Filled 2018-04-27 (×8): qty 1

## 2018-04-27 MED ORDER — DIPHENOXYLATE-ATROPINE 2.5-0.025 MG PO TABS
1.0000 | ORAL_TABLET | Freq: Four times a day (QID) | ORAL | Status: DC | PRN
  Administered 2018-04-30 – 2018-05-02 (×4): 1 via ORAL
  Filled 2018-04-27 (×4): qty 1

## 2018-04-27 MED ORDER — INSULIN GLARGINE 100 UNIT/ML ~~LOC~~ SOLN
20.0000 [IU] | Freq: Every day | SUBCUTANEOUS | Status: DC
  Administered 2018-04-27 – 2018-05-01 (×5): 20 [IU] via SUBCUTANEOUS
  Filled 2018-04-27 (×6): qty 0.2

## 2018-04-27 MED ORDER — AMPICILLIN-SULBACTAM IV (FOR PTA / DISCHARGE USE ONLY): 12.0000 g | Freq: Every day | INTRAVENOUS | Status: DC

## 2018-04-27 MED ORDER — ACETAMINOPHEN 650 MG RE SUPP: 650.0000 mg | Freq: Four times a day (QID) | RECTAL | Status: DC | PRN

## 2018-04-27 MED ORDER — METOCLOPRAMIDE HCL 5 MG/ML IJ SOLN
10.0000 mg | Freq: Four times a day (QID) | INTRAMUSCULAR | Status: DC
  Administered 2018-04-27 – 2018-05-02 (×19): 10 mg via INTRAVENOUS
  Filled 2018-04-27 (×19): qty 2

## 2018-04-27 MED ORDER — ONDANSETRON HCL 4 MG/2ML IJ SOLN
4.0000 mg | Freq: Once | INTRAMUSCULAR | Status: AC
  Administered 2018-04-27: 4 mg via INTRAVENOUS
  Filled 2018-04-27: qty 2

## 2018-04-27 MED ORDER — OXYCODONE-ACETAMINOPHEN 5-325 MG PO TABS
1.0000 | ORAL_TABLET | Freq: Once | ORAL | Status: AC
  Administered 2018-04-27: 1 via ORAL
  Filled 2018-04-27: qty 1

## 2018-04-27 MED ORDER — SODIUM CHLORIDE 0.9 % IV SOLN
INTRAVENOUS | Status: DC
  Administered 2018-04-27 – 2018-04-29 (×4): via INTRAVENOUS

## 2018-04-27 MED ORDER — FLUOXETINE HCL 20 MG PO CAPS
20.0000 mg | ORAL_CAPSULE | Freq: Every day | ORAL | Status: DC
  Administered 2018-04-27 – 2018-05-02 (×5): 20 mg via ORAL
  Filled 2018-04-27 (×7): qty 1

## 2018-04-27 MED ORDER — FERROUS SULFATE 325 (65 FE) MG PO TABS
325.0000 mg | ORAL_TABLET | Freq: Two times a day (BID) | ORAL | Status: DC
  Administered 2018-04-27 – 2018-05-02 (×10): 325 mg via ORAL
  Filled 2018-04-27 (×10): qty 1

## 2018-04-27 MED ORDER — INSULIN ASPART 100 UNIT/ML ~~LOC~~ SOLN
0.0000 [IU] | Freq: Every day | SUBCUTANEOUS | Status: DC
  Administered 2018-04-27: 5 [IU] via SUBCUTANEOUS
  Administered 2018-05-01: 2 [IU] via SUBCUTANEOUS
  Filled 2018-04-27 (×3): qty 1

## 2018-04-27 MED ORDER — ACETAMINOPHEN 325 MG PO TABS
650.0000 mg | ORAL_TABLET | Freq: Four times a day (QID) | ORAL | Status: DC | PRN
  Administered 2018-04-27: 18:00:00 650 mg via ORAL
  Filled 2018-04-27: qty 2

## 2018-04-27 MED ORDER — SODIUM CHLORIDE 0.9 % IV BOLUS
1000.0000 mL | Freq: Once | INTRAVENOUS | Status: AC
  Administered 2018-04-27: 1000 mL via INTRAVENOUS

## 2018-04-27 MED ORDER — ALPRAZOLAM 0.25 MG PO TABS
0.2500 mg | ORAL_TABLET | Freq: Three times a day (TID) | ORAL | Status: DC | PRN
  Administered 2018-04-29: 22:00:00 0.25 mg via ORAL
  Filled 2018-04-27: qty 1

## 2018-04-27 MED ORDER — SODIUM CHLORIDE 0.9 % IV SOLN
3.0000 g | Freq: Four times a day (QID) | INTRAVENOUS | Status: DC
  Administered 2018-04-27 – 2018-05-02 (×19): 3 g via INTRAVENOUS
  Filled 2018-04-27 (×23): qty 3

## 2018-04-27 MED ORDER — ZOLPIDEM TARTRATE 5 MG PO TABS
5.0000 mg | ORAL_TABLET | Freq: Every evening | ORAL | Status: DC | PRN
  Administered 2018-04-29 – 2018-05-01 (×2): 5 mg via ORAL
  Filled 2018-04-27 (×2): qty 1

## 2018-04-27 NOTE — Discharge Instructions (Addendum)
You were evaluated for "low blood count" on home blood test.  Your hemoglobin was 7.7 today and was the same (7.7) on 10/18.  Please follow up with your regular doctor to discuss if iron supplements may help.  We typically wouldn't give a blood transfusion until less than 7.  Return to ER if out of hospital blood draw shows hemoglobin less than 7 - or certainly for black or bloody stools, vomiting blood, dizziness or passing out.  Your kidney function was decreased today which seems likely to be due to dehydration after the nausea vomiting and diarrhea certainly.  Make sure you drink plenty fluids.  You were given IV fluids in the emergency department.  Return to the emergency department immediately for any worsening condition including concern for dehydration such as persistent nausea vomiting diarrhea, any abdominal pain, decreased urine output, fever, dizziness or passing out, or any other symptoms concerning to you.

## 2018-04-27 NOTE — ED Provider Notes (Addendum)
[facility] Emergency Department Provider Note ____________________________________________   I have reviewed the triage vital signs and the triage nursing note.  HISTORY  Chief Complaint Abnormal Lab   Historian Patient  HPI Shawn [name] is a 32 y.o. male with a history of diabetes and history of sepsis secondary to osteomyelitis resulting in toe amputations on the left several weeks ago for which he is still receiving home PICC line antibiotics.  He also had anemia to 6 several weeks ago and received a blood transfusion.  Patient states he had outpatient home health blood draw and was told that his blood counts were low and was told to come to the ED for further evaluation.  No headache.  No dizziness passing out.  He has had some mild nausea with some mild vomiting and loose stools.  No known bad food.  No fevers.  His foot hurts like it typically does, and he is requesting pain medication for which he states that he takes OxyContin at home.  No coughing or trouble breathing or shortness of breath.  Denies black or bloody stools.  No black or bloody emesis or diarrhea.     Past Medical History:  Diagnosis Date  . Diabetes mellitus without complication ([place])   . GERD (gastroesophageal reflux disease)   . IBS (irritable bowel syndrome)     Patient Active Problem List   Diagnosis Date Noted  . Left foot infection 04/05/2018  . Foot infection 02/06/2018  . Osteomyelitis ([place]) 01/10/2018  . Cellulitis 12/29/2017  . Sepsis ([place]) 12/18/2017  . Moderate recurrent major depression ([place]) 11/10/2017  . Type 2 diabetes mellitus with hyperosmolar nonketotic hyperglycemia ([place]) 11/09/2017  . History of migraine 07/15/2017  . IBS (irritable bowel syndrome) 07/15/2017  . Protein-calorie malnutrition, severe 07/14/2017  . Abdominal pain 07/13/2017  . GERD (gastroesophageal reflux disease) 12/30/2009  . Diabetes ([place]) 11/25/2009  . MDD (major depressive  disorder) 11/25/2009  . Hypercholesterolemia 03/07/2008    Past Surgical History:  Procedure Laterality Date  . ABDOMINAL AORTOGRAM W/LOWER EXTREMITY Right 12/23/2017   Procedure: ABDOMINAL AORTOGRAM W/LOWER EXTREMITY;  Surgeon: Katha Cabal, MD;  Location: [place] [facility];  Service: Cardiovascular;  Laterality: Right;  . AMPUTATION Right 12/24/2017   Procedure: AMPUTATION RAY;  Surgeon: Sharlotte Alamo, DPM;  Location: [facility];  Service: Podiatry;  Laterality: Right;  . AMPUTATION Left 04/06/2018   Procedure: AMPUTATION RAY;  Surgeon: Sharlotte Alamo, DPM;  Location: [facility];  Service: Podiatry;  Laterality: Left;  . AMPUTATION Left 04/09/2018   Procedure: AMPUTATION RAY;  Surgeon: Sharlotte Alamo, DPM;  Location: [facility];  Service: Podiatry;  Laterality: Left;  . APPLICATION OF WOUND VAC Right 12/24/2017   Procedure: APPLICATION OF WOUND VAC;  Surgeon: Sharlotte Alamo, DPM;  Location: [facility];  Service: Podiatry;  Laterality: Right;  . APPLICATION OF WOUND VAC Right 01/01/2018   Procedure: APPLICATION OF WOUND VAC;  Surgeon: Albertine Patricia, DPM;  Location: [facility];  Service: Podiatry;  Laterality: Right;  . IRRIGATION AND DEBRIDEMENT FOOT Right 12/20/2017   Procedure: IRRIGATION AND DEBRIDEMENT FOOT;  Surgeon: Sharlotte Alamo, DPM;  Location: [facility];  Service: Podiatry;  Laterality: Right;  . IRRIGATION AND DEBRIDEMENT FOOT Right 12/24/2017   Procedure: IRRIGATION AND DEBRIDEMENT FOOT;  Surgeon: Sharlotte Alamo, DPM;  Location: [facility];  Service: Podiatry;  Laterality: Right;  . IRRIGATION AND DEBRIDEMENT FOOT Right 01/01/2018   Procedure: IRRIGATION AND DEBRIDEMENT FOOT-SKIN,SOFT TISSUE AND BONE;  Surgeon: Albertine Patricia, DPM;  Location: [facility];  Service: Podiatry;  Laterality: Right;  . IRRIGATION AND DEBRIDEMENT FOOT Left 04/06/2018   Procedure: IRRIGATION AND DEBRIDEMENT FOOT;  Surgeon: Sharlotte Alamo, DPM;  Location: [facility];  Service: Podiatry;  Laterality: Left;  . LOWER EXTREMITY ANGIOGRAPHY  Left 04/06/2018   Procedure: Lower Extremity Angiography;  Surgeon: Algernon Huxley, MD;  Location: [place] [facility];  Service: Cardiovascular;  Laterality: Left;    Prior to Admission medications   Medication Sig Start Date End Date Taking? Authorizing Provider  ALPRAZolam (XANAX) 0.25 MG tablet Take 1 tablet (0.25 mg total) by mouth 3 (three) times daily as needed for anxiety. 12/27/17   Loletha Grayer, MD  ampicillin-sulbactam (UNASYN) IVPB Inject 12 g into the vein daily. As continuous infusion Indication:  Diabetic foot/osteomyelitis Last Day of Therapy: 05/19/18 Labs - Once weekly:  CBC/D, CMP, CRP, ESR 04/11/18 05/20/18  Henreitta Leber, MD  Blood Glucose Monitoring Suppl (FIFTY50 GLUCOSE METER 2.0) w/Device KIT Use as directed 12/29/17 12/29/18  [provider]  diphenoxylate-atropine (LOMOTIL) 2.5-0.025 MG tablet Take 1 tablet by mouth 4 (four) times daily as needed for diarrhea or loose stools. 04/11/18 04/11/19  Henreitta Leber, MD  feeding supplement, GLUCERNA SHAKE, (GLUCERNA SHAKE) LIQD Take 237 mLs by mouth 3 (three) times daily between meals. 12/27/17   Loletha Grayer, MD  ferrous sulfate 325 (65 FE) MG tablet Take 1 tablet (325 mg total) by mouth 2 (two) times daily with a meal. 01/03/18   Sainani, Belia Heman, MD  FLUoxetine (PROZAC) 20 MG capsule Take 1 capsule (20 mg total) by mouth daily. Patient taking differently: Take 20 mg by mouth at bedtime.  11/12/17   Gladstone Lighter, MD  insulin glargine (LANTUS) 100 UNIT/ML injection Inject 0.2 mLs (20 Units total) into the skin at bedtime. 04/11/18   Henreitta Leber, MD  Insulin Syringes, Disposable, U-100 0.3 ML MISC 1 Syringe by Does not apply route 4 (four) times daily -  with meals and at bedtime. 12/27/17   Loletha Grayer, MD  lisinopril (PRINIVIL,ZESTRIL) 10 MG tablet Take 1 tablet (10 mg total) by mouth daily. Patient taking differently: Take 10 mg by mouth at bedtime.  11/12/17   Gladstone Lighter, MD  metoCLOPramide  (REGLAN) 10 MG tablet Take 1 tablet (10 mg total) by mouth 3 (three) times daily with meals. 03/23/18 04/22/18  Darel Hong, MD  multivitamin (ONE-A-DAY MEN'S) TABS tablet Take 1 tablet by mouth at bedtime.     [provider]  NOVOLIN R RELION 100 UNIT/ML injection Inject 0-0.1 mLs (0-10 Units total) into the skin 3 (three) times daily as needed for high blood sugar (sliding scale). 04/11/18 06/10/18  Henreitta Leber, MD  promethazine (PHENERGAN) 25 MG tablet Take 12.5 mg by mouth every 8 (eight) hours as needed for nausea or vomiting.     [provider]  zolpidem (AMBIEN) 5 MG tablet Take 1 tablet (5 mg total) by mouth at bedtime as needed for sleep. 04/11/18 05/11/18  Henreitta Leber, MD    Allergies  Allergen Reactions  . Banana Hives, Nausea And Vomiting and Rash  . Keflex [Cephalexin] Rash    No swelling- also taken penicillin without any issue.  . Onion Hives, Nausea And Vomiting and Rash  . Sulfa Antibiotics Anaphylaxis  . Grapeseed Extract [Nutritional Supplements] Itching  . Shellfish Allergy Hives    "ALL SEAFOOD"    Family History  Problem Relation Age of Onset  . Diabetes Mother   . Ovarian cancer Mother  Social History Social History   Tobacco Use  . Smoking status: Never Smoker  . Smokeless tobacco: Never Used  Substance Use Topics  . Alcohol use: No    Frequency: Never  . Drug use: Yes    Types: Marijuana, PCP    Review of Systems  Constitutional: Negative for fever. Eyes: Negative for visual changes. ENT: Negative for sore throat. Cardiovascular: Negative for chest pain. Respiratory: Negative for shortness of breath. Gastrointestinal: Negative for abdominal pain, vomiting and diarrhea. Genitourinary: Negative for dysuria. Musculoskeletal: Negative for back pain.  Positive for left foot pain, chronic with known osteomyelitis. Skin: Negative for rash. Neurological: Negative for  headache.  ____________________________________________   PHYSICAL EXAM:  VITAL SIGNS: ED Triage Vitals  Enc Vitals Group     BP 04/27/18 1022 (!) 149/97     Pulse Rate 04/27/18 1022 93     Resp 04/27/18 1022 18     Temp 04/27/18 1022 98.5 F (36.9 C)     Temp Source 04/27/18 1022 Oral     SpO2 04/27/18 1022 99 %     Weight 04/27/18 1024 125 lb (56.7 kg)     Height 04/27/18 1024 '5\' 6"'$  (1.676 m)     Head Circumference --      Peak Flow --      Pain Score 04/27/18 1023 10     Pain Loc --      Pain Edu? --      Excl. in [place]? --      Constitutional: Alert and oriented.  HEENT      Head: Normocephalic and atraumatic.      Eyes: Conjunctivae are normal. Pupils equal and round.       Ears:         Nose: No congestion/rhinnorhea.      Mouth/Throat: Mucous membranes are moist.  Poor dentition, missing teeth.      Neck: No stridor. Cardiovascular/Chest: Normal rate, regular rhythm.  No murmurs, rubs, or gallops. Respiratory: Normal respiratory effort without tachypnea nor retractions. Breath sounds are clear and equal bilaterally. No wheezes/rales/rhonchi. Gastrointestinal: Soft. No distention, no guarding, no rebound. Nontender.    Genitourinary/rectal:Deferred Musculoskeletal: Nontender with normal range of motion in all extremities. No joint effusions.  Left lower extremity wrapped and in a postop shoe. Neurologic:  Normal speech and language. No gross or focal neurologic deficits are appreciated. Skin:  Skin is warm, dry and intact. No rash noted. Psychiatric: Mood and affect are normal. Speech and behavior are normal. Patient exhibits appropriate insight and judgment.   ____________________________________________  LABS (pertinent positives/negatives) I, Lisa Roca, MD the attending physician have reviewed the labs noted below.  Labs Reviewed  COMPREHENSIVE METABOLIC PANEL - Abnormal; Notable for the following components:      Result Value   Glucose, Bld 453 (*)     BUN 27 (*)    Creatinine, Ser 2.40 (*)    Calcium 8.4 (*)    Albumin 2.6 (*)    AST 10 (*)    GFR calc non Af Amer 35 (*)    GFR calc Af Amer 40 (*)    All other components within normal limits  CBC - Abnormal; Notable for the following components:   RBC 2.89 (*)    Hemoglobin 7.7 (*)    HCT 24.8 (*)    All other components within normal limits  GLUCOSE, CAPILLARY - Abnormal; Notable for the following components:   Glucose-Capillary 407 (*)    All other components within normal  limits  GLUCOSE, CAPILLARY - Abnormal; Notable for the following components:   Glucose-Capillary 342 (*)    All other components within normal limits  LIPASE, BLOOD  URINALYSIS, COMPLETE (UACMP) WITH MICROSCOPIC  TYPE AND SCREEN    ____________________________________________    EKG I, Lisa Roca, MD, the attending physician have personally viewed and interpreted all ECGs.  None ____________________________________________  RADIOLOGY   None __________________________________________  PROCEDURES  Procedure(s) performed: None  Procedures  Critical Care performed: None   ____________________________________________  ED COURSE / ASSESSMENT AND PLAN  Pertinent labs & imaging results that were available during my care of the patient were reviewed by me and considered in my medical decision making (see chart for details).     Patient sent in for abnormal lab, which he called low blood counts.  Hemoglobin today 7.7 is consistent with last checked at 04/21/2018.  He is found to have a slight increase in BUN and creatinine from baseline.  He is reporting some volume loss/dehydration by vomiting and diarrhea and is receiving a second liter normal saline here in the emerge department.  He is overall well-appearing.  He is not complaining of any symptoms make me concerned for sepsis.  He is diabetic and is hyperglycemic but without evidence of DKA.  We discussed observation in the  hospital versus discharge home after hydration.  He is overall well-appearing without any ongoing fluid losses and after treatment with IV fluids which I suspect is the source of the bump in BUN and creatinine, we will go ahead and discharge home.  I am asking him to be followed up very closely tomorrow by primary care physician for recheck.  Around 3:00, reevaluated patient was extremely nauseated with given the ongoing nausea with vomiting and acute renal failure, discussed with hospitalist for observation.  CONSULTATIONS: Hospitalist for admission   Patient / Family / Caregiver informed of clinical course, medical decision-making process, and agree with plan.  ng out, or any other symptoms concerning to you.     ___________________________________________   FINAL CLINICAL IMPRESSION(S) / ED DIAGNOSES   Final diagnoses:  AKI (acute kidney injury) ([place])  Dehydration  Anemia, unspecified type  Intractable vomiting with nausea, unspecified vomiting type      ___________________________________________         Note: This dictation was prepared with Dragon dictation. Any transcriptional errors that result from this process are unintentional    Lisa Roca, MD 04/27/18 1524    Lisa Roca, MD 05/08/18 1016

## 2018-04-27 NOTE — H&P (Signed)
[facility] NAME: Shawn [name]    MR#:  [id number]  DATE OF BIRTH:  [birth year]  DATE OF ADMISSION:  04/27/2018  PRIMARY CARE PHYSICIAN: Valera Castle, MD   REQUESTING/REFERRING PHYSICIAN: Dr. Lisa Roca  CHIEF COMPLAINT:   Chief Complaint  Patient presents with  . Abnormal Lab  Intractable nausea vomiting.  HISTORY OF PRESENT ILLNESS:  Shawn [name]  is a 32 y.o. male with a known history of uncontrolled diabetes, irritable bowel syndrome, GERD, recent admission for left foot osteomyelitis who presents to the hospital due to ongoing nausea vomiting and diarrhea.  On his previous hospitalization patient was also having diarrhea and was discharged on some Lomotil.  Patient says Lomotil Lomotil was helping but over the past 3 days he has had 4-5 episodes of loose stools which are brown in color.  He also admits to some nausea and vomiting over the past few days.  He went to see his primary care physician who did some routine blood work and noted that his hemoglobin was dropping and that he was in acute kidney injury and referred him to the ER for further evaluation.  Hospitalist services were contacted for admission.  Patient denies any chest pains, shortness of breath, cough congestion, fever.  He does admit to some abdominal pain secondary to his intractable nausea vomiting but no other associated symptoms.  PAST MEDICAL HISTORY:   Past Medical History:  Diagnosis Date  . Diabetes mellitus without complication ([place])   . GERD (gastroesophageal reflux disease)   . IBS (irritable bowel syndrome)     PAST SURGICAL HISTORY:   Past Surgical History:  Procedure Laterality Date  . ABDOMINAL AORTOGRAM W/LOWER EXTREMITY Right 12/23/2017   Procedure: ABDOMINAL AORTOGRAM W/LOWER EXTREMITY;  Surgeon: Katha Cabal, MD;  Location: [facility];  Service: Cardiovascular;  Laterality: Right;  . AMPUTATION Right  12/24/2017   Procedure: AMPUTATION RAY;  Surgeon: Sharlotte Alamo, DPM;  Location: [facility];  Service: Podiatry;  Laterality: Right;  . AMPUTATION Left 04/06/2018   Procedure: AMPUTATION RAY;  Surgeon: Sharlotte Alamo, DPM;  Location: [facility];  Service: Podiatry;  Laterality: Left;  . AMPUTATION Left 04/09/2018   Procedure: AMPUTATION RAY;  Surgeon: Sharlotte Alamo, DPM;  Location: [facility];  Service: Podiatry;  Laterality: Left;  . APPLICATION OF WOUND VAC Right 12/24/2017   Procedure: APPLICATION OF WOUND VAC;  Surgeon: Sharlotte Alamo, DPM;  Location: [facility];  Service: Podiatry;  Laterality: Right;  . APPLICATION OF WOUND VAC Right 01/01/2018   Procedure: APPLICATION OF WOUND VAC;  Surgeon: Albertine Patricia, DPM;  Location: [facility];  Service: Podiatry;  Laterality: Right;  . IRRIGATION AND DEBRIDEMENT FOOT Right 12/20/2017   Procedure: IRRIGATION AND DEBRIDEMENT FOOT;  Surgeon: Sharlotte Alamo, DPM;  Location: [facility];  Service: Podiatry;  Laterality: Right;  . IRRIGATION AND DEBRIDEMENT FOOT Right 12/24/2017   Procedure: IRRIGATION AND DEBRIDEMENT FOOT;  Surgeon: Sharlotte Alamo, DPM;  Location: [facility];  Service: Podiatry;  Laterality: Right;  . IRRIGATION AND DEBRIDEMENT FOOT Right 01/01/2018   Procedure: IRRIGATION AND DEBRIDEMENT FOOT-SKIN,SOFT TISSUE AND BONE;  Surgeon: Albertine Patricia, DPM;  Location: [facility];  Service: Podiatry;  Laterality: Right;  . IRRIGATION AND DEBRIDEMENT FOOT Left 04/06/2018   Procedure: IRRIGATION AND DEBRIDEMENT FOOT;  Surgeon: Sharlotte Alamo, DPM;  Location: [facility];  Service: Podiatry;  Laterality: Left;  . LOWER EXTREMITY ANGIOGRAPHY Left 04/06/2018   Procedure: Lower Extremity Angiography;  Surgeon: Lucky Cowboy,  Erskine Squibb, MD;  Location: [facility];  Service: Cardiovascular;  Laterality: Left;    SOCIAL HISTORY:   Social History   Tobacco Use  . Smoking status: Never Smoker  . Smokeless tobacco: Never Used  Substance Use Topics  . Alcohol use: No    Frequency: Never     FAMILY HISTORY:   Family History  Problem Relation Age of Onset  . Diabetes Mother   . Ovarian cancer Mother     DRUG ALLERGIES:   Allergies  Allergen Reactions  . Banana Hives, Nausea And Vomiting and Rash  . Keflex [Cephalexin] Rash    No swelling- also taken penicillin without any issue.  . Onion Hives, Nausea And Vomiting and Rash  . Sulfa Antibiotics Anaphylaxis  . Grapeseed Extract [Nutritional Supplements] Itching  . Shellfish Allergy Hives    "ALL SEAFOOD"    REVIEW OF SYSTEMS:   Review of Systems  Constitutional: Negative for fever and weight loss.  HENT: Negative for congestion, nosebleeds and tinnitus.   Eyes: Negative for blurred vision, double vision and redness.  Respiratory: Negative for cough, hemoptysis and shortness of breath.   Cardiovascular: Negative for chest pain, orthopnea, leg swelling and PND.  Gastrointestinal: Positive for abdominal pain, diarrhea, nausea and vomiting. Negative for melena.  Genitourinary: Negative for dysuria, hematuria and urgency.  Musculoskeletal: Negative for falls and joint pain.  Neurological: Negative for dizziness, tingling, sensory change, focal weakness, seizures, weakness and headaches.  Endo/Heme/Allergies: Negative for polydipsia. Does not bruise/bleed easily.  Psychiatric/Behavioral: Negative for depression and memory loss. The patient is not nervous/anxious.     MEDICATIONS AT HOME:   Prior to Admission medications   Medication Sig Start Date End Date Taking? Authorizing Provider  ALPRAZolam (XANAX) 0.25 MG tablet Take 1 tablet (0.25 mg total) by mouth 3 (three) times daily as needed for anxiety. 12/27/17  Yes Loletha Grayer, MD  diphenoxylate-atropine (LOMOTIL) 2.5-0.025 MG tablet Take 1 tablet by mouth 4 (four) times daily as needed for diarrhea or loose stools. 04/11/18 04/11/19 Yes Henreitta Leber, MD  ferrous sulfate 325 (65 FE) MG tablet Take 1 tablet (325 mg total) by mouth 2 (two) times daily with a  meal. 01/03/18  Yes Sainani, Belia Heman, MD  FLUoxetine (PROZAC) 20 MG capsule Take 1 capsule (20 mg total) by mouth daily. Patient taking differently: Take 20 mg by mouth at bedtime.  11/12/17  Yes Gladstone Lighter, MD  insulin glargine (LANTUS) 100 UNIT/ML injection Inject 0.2 mLs (20 Units total) into the skin at bedtime. 04/11/18  Yes Sainani, Belia Heman, MD  Insulin Syringes, Disposable, U-100 0.3 ML MISC 1 Syringe by Does not apply route 4 (four) times daily -  with meals and at bedtime. 12/27/17  Yes Wieting, Richard, MD  lisinopril (PRINIVIL,ZESTRIL) 10 MG tablet Take 1 tablet (10 mg total) by mouth daily. Patient taking differently: Take 10 mg by mouth at bedtime.  11/12/17  Yes Gladstone Lighter, MD  NOVOLIN R RELION 100 UNIT/ML injection Inject 0-0.1 mLs (0-10 Units total) into the skin 3 (three) times daily as needed for high blood sugar (sliding scale). 04/11/18 06/10/18 Yes Sainani, Belia Heman, MD  oxyCODONE-acetaminophen (PERCOCET/ROXICET) 5-325 MG tablet Take 1 tablet by mouth every 6 (six) hours as needed for pain. 04/24/18  Yes [provider]  promethazine (PHENERGAN) 25 MG tablet Take 12.5 mg by mouth every 8 (eight) hours as needed for nausea or vomiting.    Yes [provider]  zolpidem (AMBIEN) 5 MG tablet  Take 1 tablet (5 mg total) by mouth at bedtime as needed for sleep. 04/11/18 05/11/18 Yes Sainani, Belia Heman, MD  ampicillin-sulbactam (UNASYN) IVPB Inject 12 g into the vein daily. As continuous infusion Indication:  Diabetic foot/osteomyelitis Last Day of Therapy: 05/19/18 Labs - Once weekly:  CBC/D, CMP, CRP, ESR 04/11/18 05/20/18  Henreitta Leber, MD  feeding supplement, GLUCERNA SHAKE, (GLUCERNA SHAKE) LIQD Take 237 mLs by mouth 3 (three) times daily between meals. 12/27/17   Loletha Grayer, MD  metoCLOPramide (REGLAN) 10 MG tablet Take 1 tablet (10 mg total) by mouth 3 (three) times daily with meals. 03/23/18 04/22/18  Darel Hong, MD      VITAL SIGNS:  Blood  pressure (!) 163/95, pulse 83, temperature 98.5 F (36.9 C), temperature source Oral, resp. rate 16, height _0  (1.676 m), weight 56.7 kg, SpO2 98 %.  PHYSICAL EXAMINATION:  Physical Exam  GENERAL:  32 y.o.-year-old patient lying in the bed in no acute distress.  EYES: Pupils equal, round, reactive to light and accommodation. No scleral icterus. Extraocular muscles intact.  HEENT: Head atraumatic, normocephalic. Oropharynx and nasopharynx clear. No oropharyngeal erythema, moist oral mucosa  NECK:  Supple, no jugular venous distention. No thyroid enlargement, no tenderness.  LUNGS: Normal breath sounds bilaterally, no wheezing, rales, rhonchi. No use of accessory muscles of respiration.  CARDIOVASCULAR: S1, S2 RRR. No murmurs, rubs, gallops, clicks.  ABDOMEN: Soft, nontender, nondistended. Bowel sounds present. No organomegaly or mass.  EXTREMITIES: No pedal edema, cyanosis, or clubbing. + 2 pedal & radial pulses b/l. Left foot dressing in place with whole foot covered but no drainage noted.    NEUROLOGIC: Cranial nerves II through XII are intact. No focal Motor or sensory deficits appreciated b/l PSYCHIATRIC: The patient is alert and oriented x 3.  SKIN: No obvious rash, lesion, or ulcer.   LABORATORY PANEL:   CBC Recent Labs  Lab 04/27/18 1035  WBC 9.0  HGB 7.7*  HCT 24.8*  PLT 314   ------------------------------------------------------------------------------------------------------------------  Chemistries  Recent Labs  Lab 04/27/18 1035  NA 135  K 4.5  CL 100  CO2 24  GLUCOSE 453*  BUN 27*  CREATININE 2.40*  CALCIUM 8.4*  AST 10*  ALT 11  ALKPHOS 87  BILITOT 0.3   ------------------------------------------------------------------------------------------------------------------  Cardiac Enzymes No results for input(s): TROPONINI in the last 168  hours. ------------------------------------------------------------------------------------------------------------------  RADIOLOGY:  No results found.   IMPRESSION AND PLAN:   32 year old male with past medical history of diabetes, history of IBS, GERD, recent admission for left foot osteomyelitis who presents to the hospital due to intractable nausea vomiting with some diarrhea and also noted to be acute kidney injury.  1.  Acute kidney injury-secondary to the nausea vomiting and diarrhea. - We will hydrate the patient with IV fluids, follow BUN and creatinine urine output.  Hold patient's ACE inhibitor, renal dose meds, avoid nephrotoxins.  2.  Nausea vomiting and diarrhea-etiology unclear.  Patient is on antibiotics presently for osteomyelitis but patient is afebrile with a normal white cell count.  Patient is also on Lomotil therefore we will hold off on doing testing for C. difficile. - I will go ahead and order stool for comprehensive culture, supportive care with IV fluids, antiemetics and Lomotil.  We will also place the patient on some scheduled Reglan, given patient's history of diabetes for concern for underlying diabetic gastroparesis.  3.  Anemia- patient has a normocytic anemia.  No acute evidence of blood loss.  Patient's hemoglobin has slowly  drifted down over the past few months. - No acute need for transfusion.  I will check iron studies and folate and B12 and reticulocyte count.  4.  Diabetes type 2 without complication-continue Lantus, sliding scale insulin.  Carb controlled diet, follow blood sugars.  5.  Recent admission for left foot osteomyelitis- patient is on Unasyn at home and will continue that.  6.  Depression/anxiety-continue Prozac, Xanax.    All the records are reviewed and case discussed with ED provider. Management plans discussed with the patient, family and they are in agreement.  CODE STATUS: Full code  TOTAL TIME TAKING CARE OF THIS PATIENT: 45  minutes.    Henreitta Leber M.D on 04/27/2018 at 3:39 PM  Between 7am to 6pm - Pager - [phone]  After 6pm go to [email or web address] - password [email or web address] [place] [facility]  Office  [phone]  CC: Primary care physician; Valera Castle, MD

## 2018-04-27 NOTE — ED Notes (Signed)
Pt states still unable to urinate, aware of continued need for sample.

## 2018-04-27 NOTE — ED Triage Notes (Signed)
Pts MD office called and told him to go to the ER because his HGB was low. Pt is pale and nauseated during triage. He is a recent toe amputee from three weeks ago.

## 2018-04-27 NOTE — ED Notes (Addendum)
Pt noted to have home IV antibiotic pump still attached to R PICC line. Pump stopped at this time. Pt has orders in [facility] for abt to be given in hospital. Dr. Verdell Carmine aware home pump disconnected.

## 2018-04-27 NOTE — Care Management Note (Signed)
Case Management Note  Patient Details  Name: TORRANCE STOCKLEY MRN: [id number] Date of Birth: 27-Mar-1986  Subjective/Objective:           RNCM attempted to see patient while here in the ED.  He is lying in bed in a dark room eyes closed, sleeping.  Did not arouse when called by name.  Patient's present condition is not conducive to completing CM assessment.            Action/Plan:   Expected Discharge Date:                  Expected Discharge Plan:     In-House Referral:     Discharge planning Services     Post Acute Care Choice:    Choice offered to:     DME Arranged:    DME Agency:     HH Arranged:    HH Agency:     Status of Service:     If discussed at [employer] of Stay Meetings, dates discussed:    Additional Comments:  Shelbie Hutching, RN 04/27/2018, 2:12 PM

## 2018-04-28 DIAGNOSIS — E1122 Type 2 diabetes mellitus with diabetic chronic kidney disease: Secondary | ICD-10-CM | POA: Diagnosis present

## 2018-04-28 DIAGNOSIS — F419 Anxiety disorder, unspecified: Secondary | ICD-10-CM | POA: Diagnosis present

## 2018-04-28 DIAGNOSIS — D631 Anemia in chronic kidney disease: Secondary | ICD-10-CM | POA: Diagnosis present

## 2018-04-28 DIAGNOSIS — Z91013 Allergy to seafood: Secondary | ICD-10-CM | POA: Diagnosis not present

## 2018-04-28 DIAGNOSIS — E538 Deficiency of other specified B group vitamins: Secondary | ICD-10-CM | POA: Diagnosis present

## 2018-04-28 DIAGNOSIS — Z794 Long term (current) use of insulin: Secondary | ICD-10-CM | POA: Diagnosis not present

## 2018-04-28 DIAGNOSIS — E1169 Type 2 diabetes mellitus with other specified complication: Secondary | ICD-10-CM | POA: Diagnosis present

## 2018-04-28 DIAGNOSIS — K589 Irritable bowel syndrome without diarrhea: Secondary | ICD-10-CM | POA: Diagnosis present

## 2018-04-28 DIAGNOSIS — Z89422 Acquired absence of other left toe(s): Secondary | ICD-10-CM | POA: Diagnosis not present

## 2018-04-28 DIAGNOSIS — Z882 Allergy status to sulfonamides status: Secondary | ICD-10-CM | POA: Diagnosis not present

## 2018-04-28 DIAGNOSIS — Z79899 Other long term (current) drug therapy: Secondary | ICD-10-CM | POA: Diagnosis not present

## 2018-04-28 DIAGNOSIS — M868X7 Other osteomyelitis, ankle and foot: Secondary | ICD-10-CM | POA: Diagnosis present

## 2018-04-28 DIAGNOSIS — D638 Anemia in other chronic diseases classified elsewhere: Secondary | ICD-10-CM | POA: Diagnosis present

## 2018-04-28 DIAGNOSIS — Z4781 Encounter for orthopedic aftercare following surgical amputation: Secondary | ICD-10-CM | POA: Diagnosis not present

## 2018-04-28 DIAGNOSIS — Z881 Allergy status to other antibiotic agents status: Secondary | ICD-10-CM | POA: Diagnosis not present

## 2018-04-28 DIAGNOSIS — K219 Gastro-esophageal reflux disease without esophagitis: Secondary | ICD-10-CM | POA: Diagnosis present

## 2018-04-28 DIAGNOSIS — N179 Acute kidney failure, unspecified: Secondary | ICD-10-CM | POA: Diagnosis not present

## 2018-04-28 DIAGNOSIS — F418 Other specified anxiety disorders: Secondary | ICD-10-CM | POA: Diagnosis present

## 2018-04-28 DIAGNOSIS — I129 Hypertensive chronic kidney disease with stage 1 through stage 4 chronic kidney disease, or unspecified chronic kidney disease: Secondary | ICD-10-CM | POA: Diagnosis present

## 2018-04-28 DIAGNOSIS — E860 Dehydration: Secondary | ICD-10-CM | POA: Diagnosis present

## 2018-04-28 DIAGNOSIS — E1165 Type 2 diabetes mellitus with hyperglycemia: Secondary | ICD-10-CM | POA: Diagnosis present

## 2018-04-28 DIAGNOSIS — Z9889 Other specified postprocedural states: Secondary | ICD-10-CM | POA: Diagnosis not present

## 2018-04-28 DIAGNOSIS — F329 Major depressive disorder, single episode, unspecified: Secondary | ICD-10-CM | POA: Diagnosis present

## 2018-04-28 DIAGNOSIS — N183 Chronic kidney disease, stage 3 (moderate): Secondary | ICD-10-CM | POA: Diagnosis present

## 2018-04-28 LAB — BASIC METABOLIC PANEL
Anion gap: 5 (ref 5–15)
BUN: 20 mg/dL (ref 6–20)
CO2: 25 mmol/L (ref 22–32)
CREATININE: 1.63 mg/dL — AB (ref 0.61–1.24)
Calcium: 8.3 mg/dL — ABNORMAL LOW (ref 8.9–10.3)
Chloride: 109 mmol/L (ref 98–111)
GFR calc Af Amer: >60 mL/min (ref >60)
GFR calc non Af Amer: 55 mL/min — ABNORMAL LOW (ref >60)
Glucose, Bld: 120 mg/dL — ABNORMAL HIGH (ref 70–99)
Potassium: 3.8 mmol/L (ref 3.5–5.1)
Sodium: 139 mmol/L (ref 135–145)

## 2018-04-28 LAB — CBC
HCT: 21.2 % — ABNORMAL LOW (ref 39.0–52.0)
Hemoglobin: 6.3 g/dL — ABNORMAL LOW (ref 13.0–17.0)
MCH: 25.8 pg — ABNORMAL LOW (ref 26.0–34.0)
MCHC: 29.7 g/dL — ABNORMAL LOW (ref 30.0–36.0)
MCV: 86.9 fL (ref 80.0–100.0)
Platelets: 245 10*3/uL (ref 150–400)
RBC: 2.44 MIL/uL — ABNORMAL LOW (ref 4.22–5.81)
RDW: 13.7 % (ref 11.5–15.5)
WBC: 6.1 10*3/uL (ref 4.0–10.5)
nRBC: 0 % (ref 0.0–0.2)

## 2018-04-28 LAB — C DIFFICILE QUICK SCREEN W PCR REFLEX
C DIFFICILE (CDIFF) INTERP: NOT DETECTED
C DIFFICLE (CDIFF) ANTIGEN: NEGATIVE
C Diff toxin: NEGATIVE

## 2018-04-28 LAB — VITAMIN B12: VITAMIN B 12: 211 pg/mL (ref 180–914)

## 2018-04-28 LAB — GLUCOSE, CAPILLARY
GLUCOSE-CAPILLARY: 144 mg/dL — AB (ref 70–99)
Glucose-Capillary: 98 mg/dL (ref 70–99)
Glucose-Capillary: 266 mg/dL — ABNORMAL HIGH (ref 70–99)
Glucose-Capillary: 156 mg/dL — ABNORMAL HIGH (ref 70–99)

## 2018-04-28 LAB — PREPARE RBC (CROSSMATCH)

## 2018-04-28 MED ORDER — FOLIC ACID 1 MG PO TABS
1.0000 mg | ORAL_TABLET | Freq: Every day | ORAL | Status: DC
  Administered 2018-04-29 – 2018-05-02 (×4): 1 mg via ORAL
  Filled 2018-04-28 (×4): qty 1

## 2018-04-28 MED ORDER — FOLIC ACID 5 MG/ML IJ SOLN
1.0000 mg | Freq: Once | INTRAMUSCULAR | Status: AC
  Administered 2018-04-28: 1 mg via INTRAVENOUS
  Filled 2018-04-28: qty 0.2

## 2018-04-28 MED ORDER — SODIUM CHLORIDE 0.9 % IV SOLN
200.0000 mg | Freq: Once | INTRAVENOUS | Status: AC
  Administered 2018-04-28: 15:00:00 200 mg via INTRAVENOUS
  Filled 2018-04-28: qty 10

## 2018-04-28 MED ORDER — TRAMADOL HCL 50 MG PO TABS: 50.0000 mg | ORAL_TABLET | Freq: Four times a day (QID) | ORAL | Status: DC | PRN

## 2018-04-28 MED ORDER — AMLODIPINE BESYLATE 5 MG PO TABS
5.0000 mg | ORAL_TABLET | Freq: Every day | ORAL | Status: DC
  Administered 2018-04-28 – 2018-04-30 (×3): 5 mg via ORAL
  Filled 2018-04-28 (×3): qty 1

## 2018-04-28 MED ORDER — ADULT MULTIVITAMIN W/MINERALS CH
1.0000 | ORAL_TABLET | Freq: Every day | ORAL | Status: DC
  Administered 2018-04-28 – 2018-05-02 (×5): 1 via ORAL
  Filled 2018-04-28 (×5): qty 1

## 2018-04-28 MED ORDER — SODIUM CHLORIDE 0.9% IV SOLUTION
Freq: Once | INTRAVENOUS | Status: AC
  Administered 2018-04-28: 09:00:00 via INTRAVENOUS

## 2018-04-28 MED ORDER — OXYCODONE-ACETAMINOPHEN 5-325 MG PO TABS
1.0000 | ORAL_TABLET | ORAL | Status: DC | PRN
  Administered 2018-04-28 (×3): 2 via ORAL
  Administered 2018-04-29: 13:00:00 1 via ORAL
  Administered 2018-04-29 – 2018-05-02 (×15): 2 via ORAL
  Filled 2018-04-28 (×12): qty 2
  Filled 2018-04-28: qty 1
  Filled 2018-04-28 (×6): qty 2

## 2018-04-28 NOTE — Progress Notes (Signed)
In Progress  04/28/18 6:49 AM [place], Vida Roller, RN  Consult placed by patient's RN to start a new peripheral IV. Patient currently has a SL PICC line in place. IV antibiotic currently running. Infusion time 30 minutes. Advised RN to wait for antibiotic to complete as blood is not ready. This IV RN does not advise starting a new IV at this time.

## 2018-04-28 NOTE — Progress Notes (Signed)
Patient ID: Shawn [name], male   DOB: 1986/03/22, 32 y.o.   MRN: [id number] Patient seen.  In the hospital with acute kidney injury as well as some problems with loose stools.  Home health care has been monitoring his foot.  States he thinks it looks okay.  Objective: Moderate drainage is noted on the bandaging.  Upon removal continued full-thickness open wound down to the level of tendon and deeper tissues over the left foot.  Surrounding tissues appear viable with minimal erythema and no frank cellulitis or infection.  Assessment: Stable status post debridement osteomyelitis left foot.  Plan: Sterile saline wet-to-dry dressing applied to the left foot.  We will continue with daily wet-to-dry dressing changes until discharge from the hospital and then most likely switch back to his wound VAC.  Follow-up outpatient as scheduled or within a week of his discharge

## 2018-04-28 NOTE — Progress Notes (Signed)
Inpatient Diabetes Program Recommendations  AACE/ADA: New Consensus Statement on Inpatient Glycemic Control (2015)  Target Ranges:  Prepandial:   less than 140 mg/dL      Peak postprandial:   less than 180 mg/dL (1-2 hours)      Critically ill patients:  140 - 180 mg/dL   Results for Shawn [name], Shawn [name] (MRN [id number]) as of 04/28/2018 09:22  Ref. Range 04/27/2018 10:37 04/27/2018 15:16 04/27/2018 21:08  Glucose-Capillary Latest Ref Range: 70 - 99 mg/dL 407 (H) 342 (H) 361 (H)  5 units NOVOLOG +  20 units LANTUS    Results for Shawn [name], Shawn [name] (MRN [id number]) as of 04/28/2018 09:22  Ref. Range 04/28/2018 07:45  Glucose-Capillary Latest Ref Range: 70 - 99 mg/dL 98   Results for Shawn [name], Shawn [name] (MRN [id number]) as of 04/28/2018 09:22  Ref. Range 07/13/2017 14:23 11/10/2017 04:46 12/19/2017 03:41 04/05/2018 17:39  Hemoglobin A1C Latest Ref Range: 4.8 - 5.6 % 16.1 (H) 17.2 (H) 16.9 (H) 12.9 (H)    Admit with: Intractable nausea vomiting and diarrhea/ Acute Kidney Injury  History: Type 1 DM  Home DM Meds: Lantus 20 units QHS       Novolin R (Regular) 0-10 units TID per SSI  Current Orders: Lantus 20 units QHS      Novolog Sensitive Correction Scale/ SSI (0-9 units) TID AC + HS      Well known to the Inpatient Glycemic Control Team.  This is pt's 8th admission since January of this year.  Counseled by the DM Coordinator in May, June, and August of this year.  Received IVF down in the ED.  CBG down to 98 mg/dl this AM.    --Will follow patient during hospitalization--  Wyn Quaker RN, MSN, CDE Diabetes Coordinator Inpatient Glycemic Control Team Team Pager: [phone] (8a-5p)

## 2018-04-28 NOTE — Care Management (Signed)
Patient sent to the ED by his pcp for low hemoglobin.  Has received one unit packed cells today.  He is currently followed by [facility] RN for wound care and iv antibiotics that are administered on a continuous pump.  Podiatry removed patient's wound vac last week and will determine at a later time "maybe next week" if it will need to be reapplied

## 2018-04-28 NOTE — Progress Notes (Signed)
Initial Nutrition Assessment  DOCUMENTATION CODES:   Severe malnutrition in context of chronic illness  INTERVENTION:   - Once diet advanced, Glucerna Shake po TID, each supplement provides 220 kcal and 10 grams of protein (pt prefers vanilla flavor)  - MVI with minerals daily  - Provided education regarding nutrition management of gastroparesis and IBS  NUTRITION DIAGNOSIS:   Severe Malnutrition related to chronic illness (type 1 diabetes mellitus, IBS) as evidenced by severe fat depletion, severe muscle depletion.  GOAL:   Patient will meet greater than or equal to 90% of their needs  MONITOR:   PO intake, Diet advancement, Weight trends, I & O's, Labs  REASON FOR ASSESSMENT:   Malnutrition Screening Tool    ASSESSMENT:   32 year old male who presented to the ED on 10/24 with low hemoglobin and N/V/D. PMH significant for type 1 diabetes, GERD, IBS, sepsis secondary to osteomyelitis resulting in left toe amputations for which pt is receiving home PICC line abx, and anemia several weeks ago requiring a blood transfusion. Pt admitted with AKI.  Spoke with pt at bedside. Pt sleeping at time of visit but awoke to RD voice.  Pt shares that he is nauseous "every day" and that this has been his baseline "for a while." Pt reports that due to his persistent nausea, he has no appetite. Pt believes it may be attributed to gastroparesis but states, "I've had stomach problems my whole life." RD encouraged smaller, more frequent meals to aid in management of gastroparesis and related symptoms.  Pt states that he takes Reglan three times daily and that it "sometimes" helps with his nausea.  Pt states that he eats 1 meal daily "if I'm lucky." A typical meal includes a meat, a starch, and a vegetable. Pt reports that he does snack throughout the day on peanut butter crackers or chips. Pt states that he drinks 1 Glucerna shake daily. RD encouraged pt to drink at least 2 daily after discharge.  Pt also drinks water and diet Dr. Malachi Bonds.  Pt shares that his blood sugars have been high "in the 200's" and that he checks his sugar twice daily. Weight on admission appears stated rather than measured. Most recent measured weight appears to have been on 03/30/18 (49.9 kg).  Pt endorses weight loss but is unsure of his UBW or how much weight he has lost. Pt has noticed his clothes fitting more loosely.  Medications reviewed and include: ferrous sulfate 325 mg BID, sliding scale Novolog, Lantus 20 units daily, IV Reglan 10 mg q 6 hours, IV abx  Labs reviewed: creatinine 1.63 (H), hemoglobin 6.3 (L), HCT 21.2 (L) CBG's: 98, 361, 342, 407 (trending down)  NUTRITION - FOCUSED PHYSICAL EXAM:    Most Recent Value  Orbital Region  Moderate depletion  Upper Arm Region  Moderate depletion  Thoracic and Lumbar Region  Severe depletion  Buccal Region  Severe depletion  Temple Region  Moderate depletion  Clavicle Bone Region  Severe depletion  Clavicle and Acromion Bone Region  Severe depletion  Scapular Bone Region  Moderate depletion  Dorsal Hand  Moderate depletion  Patellar Region  Severe depletion  Anterior Thigh Region  Severe depletion  Posterior Calf Region  Severe depletion  Edema (RD Assessment)  None  Hair  Reviewed  Eyes  Reviewed  Mouth  Reviewed  Skin  Reviewed  Nails  Reviewed       Diet Order:   Diet Order  Diet clear liquid Room service appropriate? Yes; Fluid consistency: Thin  Diet effective now              EDUCATION NEEDS:   Education needs have been addressed  Skin:  Skin Assessment: Skin Integrity Issues: Incisions: closed incision to left foot s/p amputation  Last BM:  10/24 - large type 7  Height:   Ht Readings from Last 1 Encounters:  04/27/18 5\' 6"  (1.676 m)    Weight:   Wt Readings from Last 1 Encounters:  04/27/18 56.7 kg    Ideal Body Weight:  64.55 kg  BMI:  Body mass index is 20.18 kg/m.  Estimated Nutritional  Needs:   Kcal:  4650-3546  Protein:  85-100 grams  Fluid:  >/= 1.9 L    Gaynell Face, MS, RD, LDN Inpatient Clinical Dietitian Pager: [phone] Weekend/After Hours: [phone]

## 2018-04-28 NOTE — Progress Notes (Signed)
[place] at [facility]                                                                                                                                                                                  Patient Demographics   Valentine Barney, is a 32 y.o. male, DOB - 04/10/1986, VEH:[id number]  Admit date - 04/27/2018   Admitting Physician Henreitta Leber, MD  Outpatient Primary MD for the patient is Olmedo, Guy Begin, MD   LOS - 0  Subjective: Patient admitted with nausea vomiting noticed to have acute renal failure With hydration his hemoglobin has dropped Patient reports his nausea vomiting is improved, no further diarrhea  Review of Systems:   CONSTITUTIONAL: No documented fever. No fatigue, weakness. No weight gain, no weight loss.  EYES: No blurry or double vision.  ENT: No tinnitus. No postnasal drip. No redness of the oropharynx.  RESPIRATORY: No cough, no wheeze, no hemoptysis. No dyspnea.  CARDIOVASCULAR: No chest pain. No orthopnea. No palpitations. No syncope.  GASTROINTESTINAL: No nausea, no vomiting or diarrhea. No abdominal pain. No melena or hematochezia.  GENITOURINARY: No dysuria or hematuria.  ENDOCRINE: No polyuria or nocturia. No heat or cold intolerance.  HEMATOLOGY: No anemia. No bruising. No bleeding.  INTEGUMENTARY: No rashes. No lesions.  MUSCULOSKELETAL: No arthritis. No swelling. No gout.  NEUROLOGIC: No numbness, tingling, or ataxia. No seizure-type activity.  PSYCHIATRIC: No anxiety. No insomnia. No ADD.    Vitals:   Vitals:   04/28/18 0353 04/28/18 0848 04/28/18 0922 04/28/18 1230  BP: 108/73 (!) 130/92 (!) 145/97 (!) 175/114  Pulse: 74 89 88 (!) 104  Resp: '14 15 18 20  '$ Temp: (!) 97.4 F (36.3 C) 97.7 F (36.5 C) 97.8 F (36.6 C) 98 F (36.7 C)  TempSrc: Oral Oral Oral Oral  SpO2: 100% 98% 99% 100%  Weight:      Height:        Wt Readings from Last 3 Encounters:  04/27/18 56.7 kg  04/21/18 56.7 kg   04/06/18 56.7 kg     Intake/Output Summary (Last 24 hours) at 04/28/2018 1536 Last data filed at 04/28/2018 1451 Gross per 24 hour  Intake 1861.78 ml  Output 600 ml  Net 1261.78 ml    Physical Exam:   GENERAL: Pleasant-appearing in no apparent distress.  HEAD, EYES, EARS, NOSE AND THROAT: Atraumatic, normocephalic. Extraocular muscles are intact. Pupils equal and reactive to light. Sclerae anicteric. No conjunctival injection. No oro-pharyngeal erythema.  NECK: Supple. There is no jugular venous distention. No bruits, no lymphadenopathy, no thyromegaly.  HEART: Regular rate and rhythm,. No murmurs, no rubs, no clicks.  LUNGS: Clear to auscultation  bilaterally. No rales or rhonchi. No wheezes.  ABDOMEN: Soft, flat, nontender, nondistended. Has good bowel sounds. No hepatosplenomegaly appreciated.  EXTREMITIES: No evidence of any cyanosis, clubbing, or peripheral edema.  +2 pedal and radial pulses bilaterally.  NEUROLOGIC: The patient is alert, awake, and oriented x3 with no focal motor or sensory deficits appreciated bilaterally.  SKIN: Moist and warm with no rashes appreciated.  Psych: Not anxious, depressed LN: No inguinal LN enlargement    Antibiotics   Anti-infectives (From admission, onward)   Start     Dose/Rate Route Frequency Ordered Stop   04/27/18 1800  Ampicillin-Sulbactam (UNASYN) 3 g in sodium chloride 0.9 % 100 mL IVPB     3 g 200 mL/hr over 30 Minutes Intravenous Every 6 hours 04/27/18 1738     04/27/18 1730  ampicillin-sulbactam (UNASYN) IVPB  Status:  Discontinued    Note to Pharmacy:  As continuous infusion Indication:  Diabetic foot/osteomyelitis Last Day of Therapy: 05/19/18 Labs - Once weekly:  CBC/D, CMP, CRP, ESR     12 g Intravenous Daily 04/27/18 1726 04/27/18 1737      Medications   Scheduled Meds: . amLODipine  5 mg Oral Daily  . ferrous sulfate  325 mg Oral BID WC  . FLUoxetine  20 mg Oral QHS  . [START ON 85/27/7824] folic acid  1 mg Oral  Daily  . heparin  5,000 Units Subcutaneous Q8H  . insulin aspart  0-5 Units Subcutaneous QHS  . insulin aspart  0-9 Units Subcutaneous TID WC  . insulin glargine  20 Units Subcutaneous QHS  . metoCLOPramide (REGLAN) injection  10 mg Intravenous Q6H  . multivitamin with minerals  1 tablet Oral Daily   Continuous Infusions: . sodium chloride 100 mL/hr at 04/28/18 0413  . ampicillin-sulbactam (UNASYN) IV 3 g (04/28/18 1315)  . iron sucrose 200 mg (04/28/18 1451)   PRN Meds:.acetaminophen **OR** acetaminophen, ALPRAZolam, diphenoxylate-atropine, ondansetron **OR** ondansetron (ZOFRAN) IV, oxyCODONE-acetaminophen, traMADol, zolpidem   Data Review:   Micro Results Recent Results (from the past 240 hour(s))  Gastrointestinal Panel by PCR , Stool     Status: None   Collection Time: 04/27/18  6:45 PM  Result Value Ref Range Status   Campylobacter species NOT DETECTED NOT DETECTED Final   Plesimonas shigelloides NOT DETECTED NOT DETECTED Final   Salmonella species NOT DETECTED NOT DETECTED Final   Yersinia enterocolitica NOT DETECTED NOT DETECTED Final   Vibrio species NOT DETECTED NOT DETECTED Final   Vibrio cholerae NOT DETECTED NOT DETECTED Final   Enteroaggregative E coli (EAEC) NOT DETECTED NOT DETECTED Final   Enteropathogenic E coli (EPEC) NOT DETECTED NOT DETECTED Final   Enterotoxigenic E coli (ETEC) NOT DETECTED NOT DETECTED Final   Shiga like toxin producing E coli (STEC) NOT DETECTED NOT DETECTED Final   Shigella/Enteroinvasive E coli (EIEC) NOT DETECTED NOT DETECTED Final   Cryptosporidium NOT DETECTED NOT DETECTED Final   Cyclospora cayetanensis NOT DETECTED NOT DETECTED Final   Entamoeba histolytica NOT DETECTED NOT DETECTED Final   Giardia lamblia NOT DETECTED NOT DETECTED Final   Adenovirus F40/41 NOT DETECTED NOT DETECTED Final   Astrovirus NOT DETECTED NOT DETECTED Final   Norovirus GI/GII NOT DETECTED NOT DETECTED Final   Rotavirus A NOT DETECTED NOT DETECTED Final    Sapovirus (I, II, IV, and V) NOT DETECTED NOT DETECTED Final    Comment: Performed at [facility], [address] [place], [place] [address]  C Difficile Quick Screen w PCR reflex  Status: None   Collection Time: 04/27/18  6:45 PM  Result Value Ref Range Status   C Diff antigen NEGATIVE NEGATIVE Final   C Diff toxin NEGATIVE NEGATIVE Final   C Diff interpretation No C. difficile detected.  Final    Comment: Performed at [facility], [address], [place], [place] [address]    Radiology Reports Dg Abd 1 View  Result Date: 04/27/2018 CLINICAL DATA:  Low hemoglobin. EXAM: ABDOMEN - 1 VIEW COMPARISON:  None. FINDINGS: The bowel gas pattern is normal. No radio-opaque calculi or other significant radiographic abnormality are seen. IMPRESSION: Negative. Electronically Signed   By: Dorise Bullion III M.D   On: 04/27/2018 16:06   Mr Foot Left Wo Contrast  Result Date: 04/06/2018 CLINICAL DATA:  Diabetic patient with chronic pain and swelling about the fifth MTP joint and fifth toe. The patient developed an ulceration in this area 2-3 months ago. EXAM: MRI OF THE LEFT FOOT WITHOUT CONTRAST TECHNIQUE: Multiplanar, multisequence MR imaging of the left foot was performed. No intravenous contrast was administered. COMPARISON:  Multiple prior plain films, most recently 04/05/2018. FINDINGS: Bones/Joint/Cartilage There is marrow edema throughout almost the entire fifth metatarsal with sparing of the metatarsal base identified. Edema is most intense in the distal 2.5 cm of the fifth metatarsal. Intense edema is present throughout the proximal phalanx of the fifth toe. There is a nondisplaced fracture base of the proximal phalanx. Bone marrow signal is otherwise normal. Ligaments Intact. Muscles and Tendons Intact. No intramuscular fluid collection. 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 Soft tissues Skin  ulceration is seen on the plantar surface of the foot at the level of the fifth MTP joint. No underlying abscess. IMPRESSION: Findings consistent with osteomyelitis throughout the proximal phalanx of the second toe and at least the distal 2.5 cm of the fifth metatarsal. Milder degree of edema throughout the remainder of the fifth metatarsal diaphysis could be due to osteomyelitis or reactive change. Nondisplaced fracture of the base the proximal phalanx of the fifth toe is likely pathologic and related to infection. Skin ulceration subjacent to the fifth MTP joint without underlying abscess. Electronically Signed   By: Inge Rise M.D.   On: 04/06/2018 08:55   Dg Foot 2 Views Left  Result Date: 04/05/2018 CLINICAL DATA:  Diabetic patient with pain and swelling of the left foot centered laterally. EXAM: LEFT FOOT - 2 VIEW COMPARISON:  Plain films of the left foot 03/27/2018, 03/23/2018 and 02/26/2018. FINDINGS: Soft tissue swelling centered about the fifth MTP joint and fifth toe has worsened. Again seen is some periosteal reaction about the proximal phalanx of the fifth toe and rarefaction of bone in the head of the fifth metatarsal and base of the proximal phalanx. Imaged bones otherwise appear normal. No fracture is seen. IMPRESSION: Findings highly suspicious for osteomyelitis in the head of the fifth metatarsal and proximal phalanx of the little toe. Electronically Signed   By: Inge Rise M.D.   On: 04/05/2018 15:34   [place] Ekg Site Rite  Result Date: 04/10/2018 If Site Rite image not attached, placement could not be confirmed due to current cardiac rhythm.    CBC Recent Labs  Lab 04/21/18 1846 04/27/18 1035 04/28/18 0451  WBC 9.2 9.0 6.1  HGB 7.7* 7.7* 6.3*  HCT 23.9* 24.8* 21.2*  PLT 418* 314 245  MCV 83.3 85.8 86.9  MCH 26.8 26.6 25.8*  MCHC 32.2 31.0 29.7*  RDW  13.7 13.8 13.7    Chemistries  Recent Labs  Lab 04/21/18 1846 04/27/18 1035 04/28/18 0451  NA 136 135 139   K 3.9 4.5 3.8  CL 99 100 109  CO2 '29 24 25  '$ GLUCOSE 380* 453* 120*  BUN 9 27* 20  CREATININE 1.37* 2.40* 1.63*  CALCIUM 8.5* 8.4* 8.3*  AST 11* 10*  --   ALT 9 11  --   ALKPHOS 90 87  --   BILITOT 0.4 0.3  --    ------------------------------------------------------------------------------------------------------------------ estimated creatinine clearance is 53.1 mL/min (A) (by C-G formula based on SCr of 1.63 mg/dL (H)). ------------------------------------------------------------------------------------------------------------------ No results for input(s): HGBA1C in the last 72 hours. ------------------------------------------------------------------------------------------------------------------ No results for input(s): CHOL, HDL, LDLCALC, TRIG, CHOLHDL, LDLDIRECT in the last 72 hours. ------------------------------------------------------------------------------------------------------------------ No results for input(s): TSH, T4TOTAL, T3FREE, THYROIDAB in the last 72 hours.  Invalid input(s): FREET3 ------------------------------------------------------------------------------------------------------------------ Recent Labs    04/27/18 1540 04/27/18 1636  VITAMINB12  --  211  FOLATE 3.9*  --   FERRITIN 51  --   TIBC 281  --   IRON 24*  --   RETICCTPCT 2.8  --     Coagulation profile Recent Labs  Lab 04/21/18 1846  INR 0.95    No results for input(s): DDIMER in the last 72 hours.  Cardiac Enzymes No results for input(s): CKMB, TROPONINI, MYOGLOBIN in the last 168 hours.  Invalid input(s): CK ------------------------------------------------------------------------------------------------------------------ Invalid input(s): POCBNP    Assessment & Plan   32 year old male with past medical history of diabetes, history of IBS, GERD, recent admission for left foot osteomyelitis who presents to the hospital due to intractable nausea vomiting with some diarrhea  and also noted to be acute kidney injury.  1.  Acute kidney injury-secondary to the nausea vomiting and diarrhea. - Improved with IV hydration -Repeat renal function the morning   2.  Nausea vomiting and diarrhea- now much improved We will advance his diet Continue lomotil and reglan prn   3.  Anemia- due to anemia of chronic disease,  hemoglobin drop with IV hydration Status post transfusion Follow CBC in the morning   4.  Diabetes type 2 without complication-continue Lantus, sliding scale insulin.  Carb controlled diet, follow blood sugars.  5.  Recent admission for left foot osteomyelitis- continue Unasyn Appreciate podiatry input  6.  Depression/anxiety-continue Prozac, Xanax.     Code Status Orders  (From admission, onward)         Start     Ordered   04/27/18 1727  Full code  Continuous     04/27/18 1726        Code Status History    Date Active Date Inactive Code Status Order ID Comments User Context   04/05/2018 1724 04/11/2018 2142 Full Code [id number]  Dustin Flock, MD Inpatient   02/06/2018 2022 02/07/2018 1512 Full Code [id number]  Demetrios Loll, MD Inpatient   01/10/2018 2259 01/11/2018 1947 Full Code [id number]  Amelia Jo, MD Inpatient   12/30/2017 0019 01/03/2018 1457 Full Code [id number]  Harrie Foreman, MD Inpatient   12/20/2017 1541 12/27/2017 2147 Full Code [id number]  Sharlotte Alamo, DPM Inpatient   11/10/2017 0049 11/11/2017 1632 Full Code [id number]  Amelia Jo, MD Inpatient   07/13/2017 2112 07/15/2017 1730 Full Code [id number]  Jules Husbands, MD ED           Consults none  DVT Prophylaxis  Lovenox   Lab Results  Component Value Date   PLT 245 04/28/2018  Time Spent in minutes   41mn Greater than 50% of time spent in care coordination and counseling patient regarding the condition and plan of care.   SDustin FlockM.D on 04/28/2018 at 3:36 PM  Between 7am to 6pm - Pager - [phone]  After 6pm go to [email or web address] - [facility]   Office  [phone]

## 2018-04-29 LAB — GLUCOSE, CAPILLARY
GLUCOSE-CAPILLARY: 153 mg/dL — AB (ref 70–99)
GLUCOSE-CAPILLARY: 130 mg/dL — AB (ref 70–99)
Glucose-Capillary: 72 mg/dL (ref 70–99)
Glucose-Capillary: 112 mg/dL — ABNORMAL HIGH (ref 70–99)

## 2018-04-29 LAB — BASIC METABOLIC PANEL
Anion gap: 9 (ref 5–15)
BUN: 14 mg/dL (ref 6–20)
CHLORIDE: 107 mmol/L (ref 98–111)
CO2: 25 mmol/L (ref 22–32)
Calcium: 8.5 mg/dL — ABNORMAL LOW (ref 8.9–10.3)
Creatinine, Ser: 1.47 mg/dL — ABNORMAL HIGH (ref 0.61–1.24)
GFR calc Af Amer: >60 mL/min (ref >60)
GLUCOSE: 82 mg/dL (ref 70–99)
POTASSIUM: 3.4 mmol/L — AB (ref 3.5–5.1)
Sodium: 141 mmol/L (ref 135–145)

## 2018-04-29 LAB — CBC
HCT: 26.7 % — ABNORMAL LOW (ref 39.0–52.0)
HEMOGLOBIN: 8.1 g/dL — AB (ref 13.0–17.0)
MCH: 25.2 pg — AB (ref 26.0–34.0)
MCHC: 30.3 g/dL (ref 30.0–36.0)
MCV: 83.2 fL (ref 80.0–100.0)
Platelets: 320 10*3/uL (ref 150–400)
RBC: 3.21 MIL/uL — AB (ref 4.22–5.81)
RDW: 16.7 % — ABNORMAL HIGH (ref 11.5–15.5)
WBC: 7.9 10*3/uL (ref 4.0–10.5)
nRBC: 0 % (ref 0.0–0.2)

## 2018-04-29 NOTE — Progress Notes (Signed)
Patient ID: Shawn [name], male   DOB: [birth year], 32 y.o.   MRN: [id number]  [facility] PROGRESS NOTE  Shawn [name] SEG:[id number] DOB: Aug 04, 1985 DOA: 04/27/2018 PCP: Valera Castle, MD  HPI/Subjective: Patient feeling okay.  Offers no complaints.  Eating better now.  Objective: Vitals:   04/29/18 1311 04/29/18 1313  BP: (!) 149/99 126/79  Pulse: 89 90  Resp: 20   Temp: 98.4 F (36.9 C)   SpO2: 97%     Filed Weights   04/27/18 1024  Weight: 56.7 kg    ROS: Review of Systems  Constitutional: Negative for chills and fever.  Eyes: Negative for blurred vision.  Respiratory: Negative for cough and shortness of breath.   Cardiovascular: Negative for chest pain.  Gastrointestinal: Negative for abdominal pain, constipation, diarrhea, nausea and vomiting.  Genitourinary: Negative for dysuria.  Musculoskeletal: Negative for joint pain.  Neurological: Negative for dizziness and headaches.   Exam: Physical Exam  HENT:  Nose: No mucosal edema.  Mouth/Throat: No oropharyngeal exudate or posterior oropharyngeal edema.  Eyes: Pupils are equal, round, and reactive to light. Conjunctivae, EOM and lids are normal.  Neck: No JVD present. Carotid bruit is not present. No edema present. No thyroid mass and no thyromegaly present.  Cardiovascular: S1 normal and S2 normal. Exam reveals no gallop.  Murmur heard.  Systolic murmur is present with a grade of 2/6. Pulses:      Dorsalis pedis pulses are 2+ on the right side, and 2+ on the left side.  Respiratory: No respiratory distress. He has no wheezes. He has no rhonchi. He has no rales.  GI: Soft. Bowel sounds are normal. There is no tenderness.  Musculoskeletal:       Right shoulder: He exhibits no swelling.  Lymphadenopathy:    He has no cervical adenopathy.  Neurological: He is alert. No cranial nerve deficit.  Skin: Skin is warm. Nails show no clubbing.  Left foot wrapped with bandage.  Psychiatric: He has a  normal mood and affect.      Data Reviewed: Basic Metabolic Panel: Recent Labs  Lab 04/27/18 1035 04/28/18 0451 04/29/18 0811  NA 135 139 141  K 4.5 3.8 3.4*  CL 100 109 107  CO2 24 25 25   GLUCOSE 453* 120* 82  BUN 27* 20 14  CREATININE 2.40* 1.63* 1.47*  CALCIUM 8.4* 8.3* 8.5*   Liver Function Tests: Recent Labs  Lab 04/27/18 1035  AST 10*  ALT 11  ALKPHOS 87  BILITOT 0.3  PROT 6.5  ALBUMIN 2.6*   Recent Labs  Lab 04/27/18 1035  LIPASE 38   CBC: Recent Labs  Lab 04/27/18 1035 04/28/18 0451 04/29/18 0811  WBC 9.0 6.1 7.9  HGB 7.7* 6.3* 8.1*  HCT 24.8* 21.2* 26.7*  MCV 85.8 86.9 83.2  PLT 314 245 320    CBG: Recent Labs  Lab 04/28/18 1142 04/28/18 1634 04/28/18 2144 04/29/18 0750 04/29/18 1129  GLUCAP 144* 266* 156* 72 112*    Recent Results (from the past 240 hour(s))  Gastrointestinal Panel by PCR , Stool     Status: None   Collection Time: 04/27/18  6:45 PM  Result Value Ref Range Status   Campylobacter species NOT DETECTED NOT DETECTED Final   Plesimonas shigelloides NOT DETECTED NOT DETECTED Final   Salmonella species NOT DETECTED NOT DETECTED Final   Yersinia enterocolitica NOT DETECTED NOT DETECTED Final   Vibrio species NOT DETECTED NOT DETECTED Final   Vibrio cholerae NOT DETECTED NOT DETECTED  Final   Enteroaggregative E coli (EAEC) NOT DETECTED NOT DETECTED Final   Enteropathogenic E coli (EPEC) NOT DETECTED NOT DETECTED Final   Enterotoxigenic E coli (ETEC) NOT DETECTED NOT DETECTED Final   Shiga like toxin producing E coli (STEC) NOT DETECTED NOT DETECTED Final   Shigella/Enteroinvasive E coli (EIEC) NOT DETECTED NOT DETECTED Final   Cryptosporidium NOT DETECTED NOT DETECTED Final   Cyclospora cayetanensis NOT DETECTED NOT DETECTED Final   Entamoeba histolytica NOT DETECTED NOT DETECTED Final   Giardia lamblia NOT DETECTED NOT DETECTED Final   Adenovirus F40/41 NOT DETECTED NOT DETECTED Final   Astrovirus NOT DETECTED NOT  DETECTED Final   Norovirus GI/GII NOT DETECTED NOT DETECTED Final   Rotavirus A NOT DETECTED NOT DETECTED Final   Sapovirus (I, II, IV, and V) NOT DETECTED NOT DETECTED Final    Comment: Performed at [facility], [address] [place], [place] [address]  C Difficile Quick Screen w PCR reflex     Status: None   Collection Time: 04/27/18  6:45 PM  Result Value Ref Range Status   C Diff antigen NEGATIVE NEGATIVE Final   C Diff toxin NEGATIVE NEGATIVE Final   C Diff interpretation No C. difficile detected.  Final    Comment: Performed at [facility], [address] [place], [place] [address]     Studies: No results found.  Scheduled Meds: . amLODipine  5 mg Oral Daily  . ferrous sulfate  325 mg Oral BID WC  . FLUoxetine  20 mg Oral QHS  . folic acid  1 mg Oral Daily  . heparin  5,000 Units Subcutaneous Q8H  . insulin aspart  0-5 Units Subcutaneous QHS  . insulin aspart  0-9 Units Subcutaneous TID WC  . insulin glargine  20 Units Subcutaneous QHS  . metoCLOPramide (REGLAN) injection  10 mg Intravenous Q6H  . multivitamin with minerals  1 tablet Oral Daily   Continuous Infusions: . ampicillin-sulbactam (UNASYN) IV 3 g (04/29/18 1204)    Assessment/Plan:  1. Acute kidney injury on chronic kidney disease stage III.  Check BMP again tomorrow.  Discontinue IV fluids since he is eating better. 2. Nausea vomiting and diarrhea has improved. 3. Anemia of chronic disease improved with unit of blood.  Hemoglobin 8.1 today.  Stop IV fluids to prevent further dilution. 4. Type 2 diabetes mellitus on Lantus and sliding scale 5. 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  Appreciate podiatry consultation 6. Depression anxiety on Prozac and Xanax  Code Status:     Code Status Orders  (From admission, onward)         Start     Ordered   04/27/18 1727  Full code  Continuous     04/27/18 1726        Code Status History    Date Active Date Inactive Code Status Order  ID Comments User Context   04/05/2018 1724 04/11/2018 2142 Full Code [id number]  Dustin Flock, MD Inpatient   02/06/2018 2022 02/07/2018 1512 Full Code [id number]  Demetrios Loll, MD Inpatient   01/10/2018 2259 01/11/2018 1947 Full Code [id number]  Amelia Jo, MD Inpatient   12/30/2017 0019 01/03/2018 1457 Full Code [id number]  Harrie Foreman, MD Inpatient   12/20/2017 1541 12/27/2017 2147 Full Code [id number]  Sharlotte Alamo, [place] Inpatient   11/10/2017 0049 11/11/2017 1632 Full Code [id number]  Amelia Jo, MD Inpatient   07/13/2017 2112 07/15/2017 1730 Full Code [id number]  Jules Husbands, MD ED  Disposition Plan: Potential discharge home tomorrow with resumption of home health  Consultants:  Podiatry  Antibiotics:  IV Unasyn  Time spent: 28 minutes  [place]

## 2018-04-30 LAB — PREPARE RBC (CROSSMATCH)

## 2018-04-30 LAB — BASIC METABOLIC PANEL
Anion gap: 6 (ref 5–15)
BUN: 20 mg/dL (ref 6–20)
CALCIUM: 8.4 mg/dL — AB (ref 8.9–10.3)
CO2: 28 mmol/L (ref 22–32)
Chloride: 106 mmol/L (ref 98–111)
Creatinine, Ser: 1.84 mg/dL — ABNORMAL HIGH (ref 0.61–1.24)
GFR calc non Af Amer: 48 mL/min — ABNORMAL LOW (ref >60)
GFR, EST AFRICAN AMERICAN: 55 mL/min — AB (ref >60)
Glucose, Bld: 66 mg/dL — ABNORMAL LOW (ref 70–99)
POTASSIUM: 3.2 mmol/L — AB (ref 3.5–5.1)
Sodium: 140 mmol/L (ref 135–145)

## 2018-04-30 LAB — HEMOGLOBIN
Hemoglobin: 7.5 g/dL — ABNORMAL LOW (ref 13.0–17.0)
Hemoglobin: 8.6 g/dL — ABNORMAL LOW (ref 13.0–17.0)

## 2018-04-30 LAB — GLUCOSE, CAPILLARY
GLUCOSE-CAPILLARY: 81 mg/dL (ref 70–99)
GLUCOSE-CAPILLARY: 123 mg/dL — AB (ref 70–99)
GLUCOSE-CAPILLARY: 200 mg/dL — AB (ref 70–99)
Glucose-Capillary: 161 mg/dL — ABNORMAL HIGH (ref 70–99)

## 2018-04-30 LAB — CREATININE, SERUM
Creatinine, Ser: 1.75 mg/dL — ABNORMAL HIGH (ref 0.61–1.24)
GFR calc Af Amer: 59 mL/min — ABNORMAL LOW (ref >60)
GFR calc non Af Amer: 51 mL/min — ABNORMAL LOW (ref >60)

## 2018-04-30 MED ORDER — CYANOCOBALAMIN 1000 MCG/ML IJ SOLN
1000.0000 ug | Freq: Every day | INTRAMUSCULAR | Status: DC
  Administered 2018-04-30 – 2018-05-02 (×3): 1000 ug via INTRAMUSCULAR
  Filled 2018-04-30 (×3): qty 1

## 2018-04-30 MED ORDER — AMLODIPINE BESYLATE 10 MG PO TABS
10.0000 mg | ORAL_TABLET | Freq: Every day | ORAL | Status: DC
  Administered 2018-05-01 – 2018-05-02 (×2): 10 mg via ORAL
  Filled 2018-04-30 (×2): qty 1

## 2018-04-30 MED ORDER — ACETAMINOPHEN 325 MG PO TABS
650.0000 mg | ORAL_TABLET | Freq: Once | ORAL | Status: AC
  Administered 2018-04-30: 650 mg via ORAL
  Filled 2018-04-30: qty 2

## 2018-04-30 MED ORDER — POTASSIUM CHLORIDE CRYS ER 20 MEQ PO TBCR
40.0000 meq | EXTENDED_RELEASE_TABLET | Freq: Once | ORAL | Status: AC
  Administered 2018-04-30: 10:00:00 40 meq via ORAL
  Filled 2018-04-30: qty 2

## 2018-04-30 MED ORDER — AMLODIPINE BESYLATE 5 MG PO TABS
5.0000 mg | ORAL_TABLET | Freq: Once | ORAL | Status: AC
  Administered 2018-04-30: 5 mg via ORAL
  Filled 2018-04-30: qty 1

## 2018-04-30 MED ORDER — SODIUM CHLORIDE 0.9% IV SOLUTION
Freq: Once | INTRAVENOUS | Status: AC
  Administered 2018-04-30: 10:00:00 via INTRAVENOUS

## 2018-04-30 NOTE — Progress Notes (Signed)
Patient ID: Shawn [name], male   DOB: Dec 13, 1985, 31 y.o.   MRN: [id number]  [facility] PROGRESS NOTE  Shawn [name] ASN:[id number] DOB: 1986-04-15 DOA: 04/27/2018 PCP: Shawn Castle, MD  HPI/Subjective: Patient feeling okay.  Had a little abdominal pain this morning.  Had a bowel movement.  Objective: Vitals:   04/30/18 1419 04/30/18 1623  BP: (!) 163/109 (!) 147/97  Pulse: 78 85  Resp: 18 18  Temp: 97.6 F (36.4 C)   SpO2: 98% 96%    Filed Weights   04/27/18 1024  Weight: 56.7 kg    ROS: Review of Systems  Constitutional: Negative for chills and fever.  Eyes: Negative for blurred vision.  Respiratory: Negative for cough and shortness of breath.   Cardiovascular: Negative for chest pain.  Gastrointestinal: Positive for abdominal pain. Negative for constipation, diarrhea, nausea and vomiting.  Genitourinary: Negative for dysuria.  Musculoskeletal: Negative for joint pain.  Neurological: Negative for dizziness and headaches.   Exam: Physical Exam  HENT:  Nose: No mucosal edema.  Mouth/Throat: No oropharyngeal exudate or posterior oropharyngeal edema.  Eyes: Pupils are equal, round, and reactive to light. Conjunctivae, EOM and lids are normal.  Neck: No JVD present. Carotid bruit is not present. No edema present. No thyroid mass and no thyromegaly present.  Cardiovascular: S1 normal and S2 normal. Exam reveals no gallop.  Murmur heard.  Systolic murmur is present with a grade of 2/6. Pulses:      Dorsalis pedis pulses are 2+ on the right side, and 2+ on the left side.  Respiratory: No respiratory distress. He has no wheezes. He has no rhonchi. He has no rales.  GI: Soft. Bowel sounds are normal. There is no tenderness.  Musculoskeletal:       Right shoulder: He exhibits no swelling.  Lymphadenopathy:    He has no cervical adenopathy.  Neurological: He is alert. No cranial nerve deficit.  Skin: Skin is warm. Nails show no clubbing.  Left foot  wrapped with bandage.  Psychiatric: He has a normal mood and affect.      Data Reviewed: Basic Metabolic Panel: Recent Labs  Lab 04/27/18 1035 04/28/18 0451 04/29/18 0811 04/30/18 0530 04/30/18 1539  NA 135 139 141 140  --   K 4.5 3.8 3.4* 3.2*  --   CL 100 109 107 106  --   CO2 24 25 25 28   --   GLUCOSE 453* 120* 82 66*  --   BUN 27* 20 14 20   --   CREATININE 2.40* 1.63* 1.47* 1.84* 1.75*  CALCIUM 8.4* 8.3* 8.5* 8.4*  --    Liver Function Tests: Recent Labs  Lab 04/27/18 1035  AST 10*  ALT 11  ALKPHOS 87  BILITOT 0.3  PROT 6.5  ALBUMIN 2.6*   Recent Labs  Lab 04/27/18 1035  LIPASE 38   CBC: Recent Labs  Lab 04/27/18 1035 04/28/18 0451 04/29/18 0811 04/30/18 0530 04/30/18 1539  WBC 9.0 6.1 7.9  --   --   HGB 7.7* 6.3* 8.1* 7.5* 8.6*  HCT 24.8* 21.2* 26.7*  --   --   MCV 85.8 86.9 83.2  --   --   PLT 314 245 320  --   --     CBG: Recent Labs  Lab 04/29/18 1647 04/29/18 2045 04/30/18 0738 04/30/18 1202 04/30/18 1639  GLUCAP 153* 130* 81 161* 123*    Recent Results (from the past 240 hour(s))  Gastrointestinal Panel by PCR , Stool  Status: None   Collection Time: 04/27/18  6:45 PM  Result Value Ref Range Status   Campylobacter species NOT DETECTED NOT DETECTED Final   Plesimonas shigelloides NOT DETECTED NOT DETECTED Final   Salmonella species NOT DETECTED NOT DETECTED Final   Yersinia enterocolitica NOT DETECTED NOT DETECTED Final   Vibrio species NOT DETECTED NOT DETECTED Final   Vibrio cholerae NOT DETECTED NOT DETECTED Final   Enteroaggregative E coli (EAEC) NOT DETECTED NOT DETECTED Final   Enteropathogenic E coli (EPEC) NOT DETECTED NOT DETECTED Final   Enterotoxigenic E coli (ETEC) NOT DETECTED NOT DETECTED Final   Shiga like toxin producing E coli (STEC) NOT DETECTED NOT DETECTED Final   Shigella/Enteroinvasive E coli (EIEC) NOT DETECTED NOT DETECTED Final   Cryptosporidium NOT DETECTED NOT DETECTED Final   Cyclospora  cayetanensis NOT DETECTED NOT DETECTED Final   Entamoeba histolytica NOT DETECTED NOT DETECTED Final   Giardia lamblia NOT DETECTED NOT DETECTED Final   Adenovirus F40/41 NOT DETECTED NOT DETECTED Final   Astrovirus NOT DETECTED NOT DETECTED Final   Norovirus GI/GII NOT DETECTED NOT DETECTED Final   Rotavirus A NOT DETECTED NOT DETECTED Final   Sapovirus (I, II, IV, and V) NOT DETECTED NOT DETECTED Final    Comment: Performed at [facility], [address] [place], [place] [address]  C Difficile Quick Screen w PCR reflex     Status: None   Collection Time: 04/27/18  6:45 PM  Result Value Ref Range Status   C Diff antigen NEGATIVE NEGATIVE Final   C Diff toxin NEGATIVE NEGATIVE Final   C Diff interpretation No C. difficile detected.  Final    Comment: Performed at [facility], [address] [place], [place] [address]     Studies: No results found.  Scheduled Meds: . amLODipine  5 mg Oral Daily  . cyanocobalamin  1,000 mcg Intramuscular Daily  . ferrous sulfate  325 mg Oral BID WC  . FLUoxetine  20 mg Oral QHS  . folic acid  1 mg Oral Daily  . insulin aspart  0-5 Units Subcutaneous QHS  . insulin aspart  0-9 Units Subcutaneous TID WC  . insulin glargine  20 Units Subcutaneous QHS  . metoCLOPramide (REGLAN) injection  10 mg Intravenous Q6H  . multivitamin with minerals  1 tablet Oral Daily   Continuous Infusions: . ampicillin-sulbactam (UNASYN) IV 3 g (04/30/18 1547)    Assessment/Plan:  1. Anemia of chronic disease.  Hemoglobin dipped down to 7.5 today.  Transfuse another packed red blood cell transfusion today.  Recheck hemoglobin today and tomorrow.   2. Acute kidney injury on chronic kidney disease stage III 3. Nausea vomiting and diarrhea has improved. 4. Type 2 diabetes mellitus on Lantus and sliding scale 5. 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  Spoke with Dr. Cleda [name] podiatry and he can follow-up next week. 6. Depression anxiety on Prozac and  Xanax  Code Status:     Code Status Orders  (From admission, onward)         Start     Ordered   04/27/18 1727  Full code  Continuous     04/27/18 1726        Code Status History    Date Active Date Inactive Code Status Order ID Comments User Context   04/05/2018 1724 04/11/2018 2142 Full Code [id number]  Shawn Flock, MD Inpatient   02/06/2018 2022 02/07/2018 1512 Full Code [id number]  Demetrios Loll, MD Inpatient   01/10/2018 2259 01/11/2018 1947  Full Code [id number]  Amelia Jo, MD Inpatient   12/30/2017 0019 01/03/2018 1457 Full Code [id number]  Harrie Foreman, MD Inpatient   12/20/2017 1541 12/27/2017 2147 Full Code [id number]  Sharlotte Alamo, [facility] Inpatient   11/10/2017 0049 11/11/2017 1632 Full Code [id number]  Amelia Jo, MD Inpatient   07/13/2017 2112 07/15/2017 1730 Full Code [id number]  Jules Husbands, MD ED      Disposition Plan: Potential discharge home tomorrow if hemoglobin stable.  Consultants:  Podiatry  Antibiotics:  IV Unasyn  Time spent: 28 minutes  [place]

## 2018-05-01 ENCOUNTER — Inpatient Hospital Stay: Payer: Medicaid Other

## 2018-05-01 LAB — GLUCOSE, CAPILLARY
GLUCOSE-CAPILLARY: 128 mg/dL — AB (ref 70–99)
Glucose-Capillary: 100 mg/dL — ABNORMAL HIGH (ref 70–99)
Glucose-Capillary: 193 mg/dL — ABNORMAL HIGH (ref 70–99)
Glucose-Capillary: 203 mg/dL — ABNORMAL HIGH (ref 70–99)

## 2018-05-01 LAB — BASIC METABOLIC PANEL
Anion gap: 7 (ref 5–15)
BUN: 31 mg/dL — ABNORMAL HIGH (ref 6–20)
CHLORIDE: 105 mmol/L (ref 98–111)
CO2: 25 mmol/L (ref 22–32)
CREATININE: 2.3 mg/dL — AB (ref 0.61–1.24)
Calcium: 8.1 mg/dL — ABNORMAL LOW (ref 8.9–10.3)
GFR calc Af Amer: 42 mL/min — ABNORMAL LOW (ref >60)
GFR calc non Af Amer: 36 mL/min — ABNORMAL LOW (ref >60)
Glucose, Bld: 147 mg/dL — ABNORMAL HIGH (ref 70–99)
Potassium: 4.1 mmol/L (ref 3.5–5.1)
Sodium: 137 mmol/L (ref 135–145)

## 2018-05-01 LAB — TYPE AND SCREEN
ABO/RH(D): A NEG
ANTIBODY SCREEN: NEGATIVE
Unit division: 0
Unit division: 0

## 2018-05-01 LAB — BPAM RBC
Blood Product Expiration Date: 201911012359
Blood Product Expiration Date: 201911022359
ISSUE DATE / TIME: 201910250858
ISSUE DATE / TIME: 201910271051
UNIT TYPE AND RH: 600
Unit Type and Rh: 600

## 2018-05-01 LAB — HEMOGLOBIN: HEMOGLOBIN: 8.1 g/dL — AB (ref 13.0–17.0)

## 2018-05-01 MED ORDER — PROMETHAZINE HCL 25 MG PO TABS
25.0000 mg | ORAL_TABLET | Freq: Four times a day (QID) | ORAL | Status: DC | PRN
  Filled 2018-05-01: qty 1

## 2018-05-01 MED ORDER — SODIUM CHLORIDE 0.9 % IV SOLN
INTRAVENOUS | Status: DC
  Administered 2018-05-01 (×3): via INTRAVENOUS

## 2018-05-01 MED ORDER — GLUCERNA SHAKE PO LIQD
237.0000 mL | Freq: Three times a day (TID) | ORAL | Status: DC
  Administered 2018-05-01: 11:00:00 237 mL via ORAL

## 2018-05-01 NOTE — Progress Notes (Signed)
Patient ID: Shawn [name], male   DOB: 11/18/1985, 32 y.o.   MRN: [id number]  [facility] PROGRESS NOTE  Shawn [name] TSV:[id number] DOB: 1985-07-28 DOA: 04/27/2018 PCP: Valera Castle, MD  HPI/Subjective: Patient very upset that his kidney function is worse today and hemoglobin is low.    Objective: Vitals:   05/01/18 0823 05/01/18 1425  BP: 125/87 131/87  Pulse: 87 87  Resp: 18 18  Temp: 98.1 F (36.7 C) 98 F (36.7 C)  SpO2: 97% 94%    Filed Weights   04/27/18 1024  Weight: 56.7 kg    ROS: Review of Systems  Constitutional: Negative for chills and fever.  Eyes: Negative for blurred vision.  Respiratory: Negative for cough and shortness of breath.   Cardiovascular: Negative for chest pain.  Gastrointestinal: Positive for abdominal pain. Negative for constipation, diarrhea, nausea and vomiting.  Genitourinary: Negative for dysuria.  Musculoskeletal: Negative for joint pain.  Neurological: Negative for dizziness and headaches.   Exam: Physical Exam  HENT:  Nose: No mucosal edema.  Mouth/Throat: No oropharyngeal exudate or posterior oropharyngeal edema.  Eyes: Pupils are equal, round, and reactive to light. Conjunctivae, EOM and lids are normal.  Neck: No JVD present. Carotid bruit is not present. No edema present. No thyroid mass and no thyromegaly present.  Cardiovascular: S1 normal and S2 normal. Exam reveals no gallop.  Murmur heard.  Systolic murmur is present with a grade of 2/6. Pulses:      Dorsalis pedis pulses are 2+ on the right side, and 2+ on the left side.  Respiratory: No respiratory distress. He has no wheezes. He has no rhonchi. He has no rales.  GI: Soft. Bowel sounds are normal. There is no tenderness.  Musculoskeletal:       Right shoulder: He exhibits no swelling.  Lymphadenopathy:    He has no cervical adenopathy.  Neurological: He is alert. No cranial nerve deficit.  Skin: Skin is warm. Nails show no clubbing.  Left foot  wrapped with bandage.  Psychiatric: He has a normal mood and affect.      Data Reviewed: Basic Metabolic Panel: Recent Labs  Lab 04/27/18 1035 04/28/18 0451 04/29/18 0811 04/30/18 0530 04/30/18 1539 05/01/18 0720  NA 135 139 141 140  --  137  K 4.5 3.8 3.4* 3.2*  --  4.1  CL 100 109 107 106  --  105  CO2 24 25 25 28   --  25  GLUCOSE 453* 120* 82 66*  --  147*  BUN 27* 20 14 20   --  31*  CREATININE 2.40* 1.63* 1.47* 1.84* 1.75* 2.30*  CALCIUM 8.4* 8.3* 8.5* 8.4*  --  8.1*   Liver Function Tests: Recent Labs  Lab 04/27/18 1035  AST 10*  ALT 11  ALKPHOS 87  BILITOT 0.3  PROT 6.5  ALBUMIN 2.6*   Recent Labs  Lab 04/27/18 1035  LIPASE 38   CBC: Recent Labs  Lab 04/27/18 1035 04/28/18 0451 04/29/18 0811 04/30/18 0530 04/30/18 1539 05/01/18 0720  WBC 9.0 6.1 7.9  --   --   --   HGB 7.7* 6.3* 8.1* 7.5* 8.6* 8.1*  HCT 24.8* 21.2* 26.7*  --   --   --   MCV 85.8 86.9 83.2  --   --   --   PLT 314 245 320  --   --   --     CBG: Recent Labs  Lab 04/30/18 1639 04/30/18 2124 05/01/18 0745 05/01/18 1216 05/01/18  [place] 100* 193*    Recent Results (from the past 240 hour(s))  Gastrointestinal Panel by PCR , Stool     Status: None   Collection Time: 04/27/18  6:45 PM  Result Value Ref Range Status   Campylobacter species NOT DETECTED NOT DETECTED Final   Plesimonas shigelloides NOT DETECTED NOT DETECTED Final   Salmonella species NOT DETECTED NOT DETECTED Final   Yersinia enterocolitica NOT DETECTED NOT DETECTED Final   Vibrio species NOT DETECTED NOT DETECTED Final   Vibrio cholerae NOT DETECTED NOT DETECTED Final   Enteroaggregative E coli (EAEC) NOT DETECTED NOT DETECTED Final   Enteropathogenic E coli (EPEC) NOT DETECTED NOT DETECTED Final   Enterotoxigenic E coli (ETEC) NOT DETECTED NOT DETECTED Final   Shiga like toxin producing E coli (STEC) NOT DETECTED NOT DETECTED Final   Shigella/Enteroinvasive E coli (EIEC) NOT DETECTED  NOT DETECTED Final   Cryptosporidium NOT DETECTED NOT DETECTED Final   Cyclospora cayetanensis NOT DETECTED NOT DETECTED Final   Entamoeba histolytica NOT DETECTED NOT DETECTED Final   Giardia lamblia NOT DETECTED NOT DETECTED Final   Adenovirus F40/41 NOT DETECTED NOT DETECTED Final   Astrovirus NOT DETECTED NOT DETECTED Final   Norovirus GI/GII NOT DETECTED NOT DETECTED Final   Rotavirus A NOT DETECTED NOT DETECTED Final   Sapovirus (I, II, IV, and V) NOT DETECTED NOT DETECTED Final    Comment: Performed at [facility], [address] [place], [place] [address]  C Difficile Quick Screen w PCR reflex     Status: None   Collection Time: 04/27/18  6:45 PM  Result Value Ref Range Status   C Diff antigen NEGATIVE NEGATIVE Final   C Diff toxin NEGATIVE NEGATIVE Final   C Diff interpretation No C. difficile detected.  Final    Comment: Performed at [facility], [address] [place], [place] [address]     Studies: No results found.  Scheduled Meds: . amLODipine  10 mg Oral Daily  . cyanocobalamin  1,000 mcg Intramuscular Daily  . feeding supplement (GLUCERNA SHAKE)  237 mL Oral TID BM  . ferrous sulfate  325 mg Oral BID WC  . FLUoxetine  20 mg Oral QHS  . folic acid  1 mg Oral Daily  . insulin aspart  0-5 Units Subcutaneous QHS  . insulin aspart  0-9 Units Subcutaneous TID WC  . insulin glargine  20 Units Subcutaneous QHS  . metoCLOPramide (REGLAN) injection  10 mg Intravenous Q6H  . multivitamin with minerals  1 tablet Oral Daily   Continuous Infusions: . sodium chloride 75 mL/hr at 05/01/18 1107  . ampicillin-sulbactam (UNASYN) IV 3 g (05/01/18 1648)    Assessment/Plan:  1. Acute kidney injury on chronic kidney disease stage III.  Restart IV fluids.  Patient had some diarrhea yesterday which could have contributed to the worsening kidney function today.  Nephrology consultation. 2. anemia of chronic disease.  Hemoglobin still low.  Patient stated he  had some bleeding from his foot.  Received 2 units of packed red blood cells during the hospital stay.  Recheck hemoglobin today and tomorrow.   3. Nausea vomiting and diarrhea has improved. 4. Type 2 diabetes mellitus on Lantus and sliding scale 5. 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  Spoke with Dr. Cleda Mccreedy podiatry and he can follow-up next week. 6. Depression anxiety on Prozac and Xanax  Code Status:     Code Status Orders  (From admission, onward)  Start     Ordered   04/27/18 1727  Full code  Continuous     04/27/18 1726        Code Status History    Date Active Date Inactive Code Status Order ID Comments User Context   04/05/2018 1724 04/11/2018 2142 Full Code [id number]  Dustin Flock, MD Inpatient   02/06/2018 2022 02/07/2018 1512 Full Code [id number]  Demetrios Loll, MD Inpatient   01/10/2018 2259 01/11/2018 1947 Full Code [id number]  Amelia Jo, MD Inpatient   12/30/2017 0019 01/03/2018 1457 Full Code [id number]  Harrie Foreman, MD Inpatient   12/20/2017 1541 12/27/2017 2147 Full Code [id number]  Sharlotte Alamo, [facility] Inpatient   11/10/2017 0049 11/11/2017 1632 Full Code [id number]  Amelia Jo, MD Inpatient   07/13/2017 2112 07/15/2017 1730 Full Code [id number]  Jules Husbands, MD ED      Disposition Plan: Would like to see creatinine a little bit better prior to disposition  Consultants:  Podiatry  Antibiotics:  IV Unasyn  Time spent: 27 minutes  [place]

## 2018-05-01 NOTE — Consult Note (Signed)
[facility]  CONSULT NOTE    Date: 05/01/2018                  Patient Name:  Shawn [name]  MRN: [id number]  DOB: 13-Aug-1985  Age / Sex: 32 y.o., male         PCP: Valera Castle, MD                 Service Requesting Consult: Dr. Earleen Newport                 Reason for Consult: Acute renal failure            History of Present Illness: Mr. MASIAH WOODY is admitted to [facility] on 04/27/2018 for Dehydration [E86.0] AKI (acute kidney injury) ([place]) [N17.9] Nausea and vomiting [R11.2] Anemia, unspecified type [D64.9] Intractable vomiting with nausea, unspecified vomiting type [R11.2]  Patient had amputation of left toes by Dr. Cleda Mccreedy on 10/6. Patient states he developed nausea, vomiting and diarrhea. Found to have anemia. Transfusion yesterday.   Creatinine elevated on admission. Started on IV fluids. However creatinine rose back to 2.3 and nephrology consulted.   Patient was to establish with nephrology last month but he did not make his appointment.      Medications: Outpatient medications: Medications Prior to Admission  Medication Sig Dispense Refill Last Dose  . ALPRAZolam (XANAX) 0.25 MG tablet Take 1 tablet (0.25 mg total) by mouth 3 (three) times daily as needed for anxiety. 15 tablet 0 PRN at PRN  . diphenoxylate-atropine (LOMOTIL) 2.5-0.025 MG tablet Take 1 tablet by mouth 4 (four) times daily as needed for diarrhea or loose stools. 30 tablet 1   . ferrous sulfate 325 (65 FE) MG tablet Take 1 tablet (325 mg total) by mouth 2 (two) times daily with a meal. 60 tablet 0 02/06/2018 at 0800  . FLUoxetine (PROZAC) 20 MG capsule Take 1 capsule (20 mg total) by mouth daily. (Patient taking differently: Take 20 mg by mouth at bedtime. ) 30 capsule 3 02/05/2018 at 2000  . insulin glargine (LANTUS) 100 UNIT/ML injection Inject 0.2 mLs (20 Units total) into the skin at bedtime. 10 mL 11   . Insulin Syringes, Disposable, U-100 0.3 ML MISC 1 Syringe by Does  not apply route 4 (four) times daily -  with meals and at bedtime. 100 each 0   . lisinopril (PRINIVIL,ZESTRIL) 10 MG tablet Take 1 tablet (10 mg total) by mouth daily. (Patient taking differently: Take 10 mg by mouth at bedtime. ) 30 tablet 2 02/05/2018 at 2000  . NOVOLIN R RELION 100 UNIT/ML injection Inject 0-0.1 mLs (0-10 Units total) into the skin 3 (three) times daily as needed for high blood sugar (sliding scale). 9 mL 0   . oxyCODONE-acetaminophen (PERCOCET/ROXICET) 5-325 MG tablet Take 1 tablet by mouth every 6 (six) hours as needed for pain.     . promethazine (PHENERGAN) 25 MG tablet Take 12.5 mg by mouth every 8 (eight) hours as needed for nausea or vomiting.    PRN at PRN  . zolpidem (AMBIEN) 5 MG tablet Take 1 tablet (5 mg total) by mouth at bedtime as needed for sleep. 30 tablet 0   . ampicillin-sulbactam (UNASYN) IVPB Inject 12 g into the vein daily. As continuous infusion Indication:  Diabetic foot/osteomyelitis Last Day of Therapy: 05/19/18 Labs - Once weekly:  CBC/D, CMP, CRP, ESR 40 Units 0   . feeding supplement, GLUCERNA SHAKE, (GLUCERNA SHAKE) LIQD Take 237 mLs  by mouth 3 (three) times daily between meals. 60 Can 0 Past Week at Unknown time  . metoCLOPramide (REGLAN) 10 MG tablet Take 1 tablet (10 mg total) by mouth 3 (three) times daily with meals. 90 tablet 0     Current medications: Current Facility-Administered Medications  Medication Dose Route Frequency Provider Last Rate Last Dose  . 0.9 %  sodium chloride infusion   Intravenous Continuous Loletha Grayer, MD 75 mL/hr at 05/01/18 1107    . acetaminophen (TYLENOL) tablet 650 mg  650 mg Oral Q6H PRN Henreitta Leber, MD   650 mg at 04/27/18 1756   Or  . acetaminophen (TYLENOL) suppository 650 mg  650 mg Rectal Q6H PRN Henreitta Leber, MD      . ALPRAZolam Duanne Moron) tablet 0.25 mg  0.25 mg Oral TID PRN Henreitta Leber, MD   0.25 mg at 04/29/18 2215  . amLODipine (NORVASC) tablet 10 mg  10 mg Oral Daily Loletha Grayer, MD   10 mg at 05/01/18 0825  . Ampicillin-Sulbactam (UNASYN) 3 g in sodium chloride 0.9 % 100 mL IVPB  3 g Intravenous Q6H Sainani, Belia Heman, MD 200 mL/hr at 05/01/18 1222 3 g at 05/01/18 1222  . cyanocobalamin ((VITAMIN B-12)) injection 1,000 mcg  1,000 mcg Intramuscular Daily Loletha Grayer, MD   1,000 mcg at 05/01/18 1051  . diphenoxylate-atropine (LOMOTIL) 2.5-0.025 MG per tablet 1 tablet  1 tablet Oral QID PRN Henreitta Leber, MD   1 tablet at 05/01/18 0730  . feeding supplement (GLUCERNA SHAKE) (GLUCERNA SHAKE) liquid 237 mL  237 mL Oral TID BM Loletha Grayer, MD   237 mL at 05/01/18 1056  . ferrous sulfate tablet 325 mg  325 mg Oral BID WC Henreitta Leber, MD   325 mg at 05/01/18 0825  . FLUoxetine (PROZAC) capsule 20 mg  20 mg Oral QHS Henreitta Leber, MD   20 mg at 04/30/18 2332  . folic acid (FOLVITE) tablet 1 mg  1 mg Oral Daily Dustin Flock, MD   1 mg at 05/01/18 0825  . insulin aspart (novoLOG) injection 0-5 Units  0-5 Units Subcutaneous QHS Henreitta Leber, MD   5 Units at 04/27/18 2152  . insulin aspart (novoLOG) injection 0-9 Units  0-9 Units Subcutaneous TID WC Henreitta Leber, MD   1 Units at 05/01/18 0825  . insulin glargine (LANTUS) injection 20 Units  20 Units Subcutaneous QHS Henreitta Leber, MD   20 Units at 04/30/18 2330  . metoCLOPramide (REGLAN) injection 10 mg  10 mg Intravenous Q6H Henreitta Leber, MD   10 mg at 05/01/18 1217  . multivitamin with minerals tablet 1 tablet  1 tablet Oral Daily Dustin Flock, MD   1 tablet at 05/01/18 0825  . oxyCODONE-acetaminophen (PERCOCET/ROXICET) 5-325 MG per tablet 1-2 tablet  1-2 tablet Oral Q4H PRN Dustin Flock, MD   2 tablet at 05/01/18 1050  . promethazine (PHENERGAN) tablet 25 mg  25 mg Oral Q6H PRN Wieting, Richard, MD      . traMADol Veatrice Bourbon) tablet 50 mg  50 mg Oral Q6H PRN Dustin Flock, MD      . zolpidem (AMBIEN) tablet 5 mg  5 mg Oral QHS PRN Henreitta Leber, MD   5 mg at 05/01/18 0014       Allergies: Allergies  Allergen Reactions  . Banana Hives, Nausea And Vomiting and Rash  . Keflex [Cephalexin] Rash    No swelling- also taken penicillin without any  issue.  . Onion Hives, Nausea And Vomiting and Rash  . Sulfa Antibiotics Anaphylaxis  . Grapeseed Extract [Nutritional Supplements] Itching  . Shellfish Allergy Hives    "ALL SEAFOOD"      Past Medical History: Past Medical History:  Diagnosis Date  . Diabetes mellitus without complication ([place])   . GERD (gastroesophageal reflux disease)   . IBS (irritable bowel syndrome)      Past Surgical History: Past Surgical History:  Procedure Laterality Date  . ABDOMINAL AORTOGRAM W/LOWER EXTREMITY Right 12/23/2017   Procedure: ABDOMINAL AORTOGRAM W/LOWER EXTREMITY;  Surgeon: Katha Cabal, MD;  Location: [facility];  Service: Cardiovascular;  Laterality: Right;  . AMPUTATION Right 12/24/2017   Procedure: AMPUTATION RAY;  Surgeon: Sharlotte Alamo, DPM;  Location: [facility];  Service: Podiatry;  Laterality: Right;  . AMPUTATION Left 04/06/2018   Procedure: AMPUTATION RAY;  Surgeon: Sharlotte Alamo, DPM;  Location: [facility];  Service: Podiatry;  Laterality: Left;  . AMPUTATION Left 04/09/2018   Procedure: AMPUTATION RAY;  Surgeon: Sharlotte Alamo, DPM;  Location: [facility];  Service: Podiatry;  Laterality: Left;  . APPLICATION OF WOUND VAC Right 12/24/2017   Procedure: APPLICATION OF WOUND VAC;  Surgeon: Sharlotte Alamo, DPM;  Location: [facility];  Service: Podiatry;  Laterality: Right;  . APPLICATION OF WOUND VAC Right 01/01/2018   Procedure: APPLICATION OF WOUND VAC;  Surgeon: Albertine Patricia, DPM;  Location: [facility];  Service: Podiatry;  Laterality: Right;  . IRRIGATION AND DEBRIDEMENT FOOT Right 12/20/2017   Procedure: IRRIGATION AND DEBRIDEMENT FOOT;  Surgeon: Sharlotte Alamo, DPM;  Location: [facility];  Service: Podiatry;  Laterality: Right;  . IRRIGATION AND DEBRIDEMENT FOOT Right 12/24/2017   Procedure: IRRIGATION AND  DEBRIDEMENT FOOT;  Surgeon: Sharlotte Alamo, DPM;  Location: [facility];  Service: Podiatry;  Laterality: Right;  . IRRIGATION AND DEBRIDEMENT FOOT Right 01/01/2018   Procedure: IRRIGATION AND DEBRIDEMENT FOOT-SKIN,SOFT TISSUE AND BONE;  Surgeon: Albertine Patricia, DPM;  Location: [facility];  Service: Podiatry;  Laterality: Right;  . IRRIGATION AND DEBRIDEMENT FOOT Left 04/06/2018   Procedure: IRRIGATION AND DEBRIDEMENT FOOT;  Surgeon: Sharlotte Alamo, DPM;  Location: [facility];  Service: Podiatry;  Laterality: Left;  . LOWER EXTREMITY ANGIOGRAPHY Left 04/06/2018   Procedure: Lower Extremity Angiography;  Surgeon: Algernon Huxley, MD;  Location: [facility];  Service: Cardiovascular;  Laterality: Left;     Family History: Family History  Problem Relation Age of Onset  . Diabetes Mother   . Ovarian cancer Mother      Social History: Social History   Socioeconomic History  . Marital status: Single    Spouse name: Not on file  . Number of children: Not on file  . Years of education: Not on file  . Highest education level: Not on file  Occupational History  . Not on file  Social Needs  . Financial resource strain: Not on file  . Food insecurity:    Worry: Not on file    Inability: Not on file  . Transportation needs:    Medical: Not on file    Non-medical: Not on file  Tobacco Use  . Smoking status: Never Smoker  . Smokeless tobacco: Never Used  Substance and Sexual Activity  . Alcohol use: No    Frequency: Never  . Drug use: Yes    Types: Marijuana, PCP  . Sexual activity: Not on file  Lifestyle  . Physical activity:    Days per week: Not on file  Minutes per session: Not on file  . Stress: Not on file  Relationships  . Social connections:    Talks on phone: Not on file    Gets together: Not on file    Attends religious service: Not on file    Active member of club or organization: Not on file    Attends meetings of clubs or organizations: Not on file    Relationship  status: Not on file  . Intimate partner violence:    Fear of current or ex partner: Not on file    Emotionally abused: Not on file    Physically abused: Not on file    Forced sexual activity: Not on file  Other Topics Concern  . Not on file  Social History Narrative  . Not on file     Review of Systems: Review of Systems  Constitutional: Positive for chills, fever, malaise/fatigue and weight loss. Negative for diaphoresis.  HENT: Negative.  Negative for congestion, ear discharge, ear pain, hearing loss, nosebleeds, sinus pain, sore throat and tinnitus.   Eyes: Negative.  Negative for blurred vision, double vision, photophobia, pain, discharge and redness.  Respiratory: Negative.  Negative for cough, hemoptysis, sputum production, shortness of breath, wheezing and stridor.   Cardiovascular: Negative for chest pain, palpitations, orthopnea, claudication, leg swelling and PND.  Gastrointestinal: Positive for abdominal pain, diarrhea, nausea and vomiting. Negative for blood in stool, constipation, heartburn and melena.  Genitourinary: Negative.  Negative for dysuria, flank pain, frequency, hematuria and urgency.  Musculoskeletal: Negative.  Negative for back pain, falls, joint pain, myalgias and neck pain.  Skin: Negative.  Negative for itching and rash.  Neurological: Positive for tingling and sensory change. Negative for dizziness, tremors, speech change, focal weakness, seizures, loss of consciousness, weakness and headaches.  Endo/Heme/Allergies: Negative.  Negative for environmental allergies and polydipsia. Does not bruise/bleed easily.  Psychiatric/Behavioral: Negative.  Negative for depression, hallucinations, memory loss, substance abuse and suicidal ideas. The patient is not nervous/anxious and does not have insomnia.     Vital Signs: Blood pressure 131/87, pulse 87, temperature 98 F (36.7 C), temperature source Oral, resp. rate 18, height '5\' 6"'$  (1.676 m), weight 56.7 kg, SpO2  94 %.  Weight trends: Filed Weights   04/27/18 1024  Weight: 56.7 kg    Physical Exam: General: NAD  Head: Normocephalic, atraumatic. Moist oral mucosal membranes  Eyes: Anicteric, PERRL  Neck: Supple, trachea midline  Lungs:  Clear to auscultation  Heart: Regular rate and rhythm  Abdomen:  Soft, nontender  Extremities:  1+ peripheral edema, left foot in dressings.   Neurologic: Nonfocal, moving all four extremities  Skin: No lesions         Lab results: Basic Metabolic Panel: Recent Labs  Lab 04/29/18 0811 04/30/18 0530 04/30/18 1539 05/01/18 0720  NA 141 140  --  137  K 3.4* 3.2*  --  4.1  CL 107 106  --  105  CO2 25 28  --  25  GLUCOSE 82 66*  --  147*  BUN 14 20  --  31*  CREATININE 1.47* 1.84* 1.75* 2.30*  CALCIUM 8.5* 8.4*  --  8.1*    Liver Function Tests: Recent Labs  Lab 04/27/18 1035  AST 10*  ALT 11  ALKPHOS 87  BILITOT 0.3  PROT 6.5  ALBUMIN 2.6*   Recent Labs  Lab 04/27/18 1035  LIPASE 38   No results for input(s): AMMONIA in the last 168 hours.  CBC: Recent Labs  Lab  04/27/18 1035 04/28/18 0451 04/29/18 0811 04/30/18 0530 04/30/18 1539 05/01/18 0720  WBC 9.0 6.1 7.9  --   --   --   HGB 7.7* 6.3* 8.1* 7.5* 8.6* 8.1*  HCT 24.8* 21.2* 26.7*  --   --   --   MCV 85.8 86.9 83.2  --   --   --   PLT 314 245 320  --   --   --     Cardiac Enzymes: No results for input(s): CKTOTAL, CKMB, CKMBINDEX, TROPONINI in the last 168 hours.  BNP: Invalid input(s): POCBNP  CBG: Recent Labs  Lab 04/30/18 1202 04/30/18 1639 04/30/18 2124 05/01/18 0745 05/01/18 1216  GLUCAP 161* 123* 200* 128* 100*    Microbiology: Results for orders placed or performed during the hospital encounter of 04/27/18  Gastrointestinal Panel by PCR , Stool     Status: None   Collection Time: 04/27/18  6:45 PM  Result Value Ref Range Status   Campylobacter species NOT DETECTED NOT DETECTED Final   Plesimonas shigelloides NOT DETECTED NOT DETECTED Final    Salmonella species NOT DETECTED NOT DETECTED Final   Yersinia enterocolitica NOT DETECTED NOT DETECTED Final   Vibrio species NOT DETECTED NOT DETECTED Final   Vibrio cholerae NOT DETECTED NOT DETECTED Final   Enteroaggregative E coli (EAEC) NOT DETECTED NOT DETECTED Final   Enteropathogenic E coli (EPEC) NOT DETECTED NOT DETECTED Final   Enterotoxigenic E coli (ETEC) NOT DETECTED NOT DETECTED Final   Shiga like toxin producing E coli (STEC) NOT DETECTED NOT DETECTED Final   Shigella/Enteroinvasive E coli (EIEC) NOT DETECTED NOT DETECTED Final   Cryptosporidium NOT DETECTED NOT DETECTED Final   Cyclospora cayetanensis NOT DETECTED NOT DETECTED Final   Entamoeba histolytica NOT DETECTED NOT DETECTED Final   Giardia lamblia NOT DETECTED NOT DETECTED Final   Adenovirus F40/41 NOT DETECTED NOT DETECTED Final   Astrovirus NOT DETECTED NOT DETECTED Final   Norovirus GI/GII NOT DETECTED NOT DETECTED Final   Rotavirus A NOT DETECTED NOT DETECTED Final   Sapovirus (I, II, IV, and V) NOT DETECTED NOT DETECTED Final    Comment: Performed at [facility], [address] [place], [place] [address]  C Difficile Quick Screen w PCR reflex     Status: None   Collection Time: 04/27/18  6:45 PM  Result Value Ref Range Status   C Diff antigen NEGATIVE NEGATIVE Final   C Diff toxin NEGATIVE NEGATIVE Final   C Diff interpretation No C. difficile detected.  Final    Comment: Performed at [facility], [address] [place], [place] [address]    Coagulation Studies: No results for input(s): LABPROT, INR in the last 72 hours.  Urinalysis: No results for input(s): COLORURINE, LABSPEC, PHURINE, GLUCOSEU, HGBUR, BILIRUBINUR, KETONESUR, PROTEINUR, UROBILINOGEN, NITRITE, LEUKOCYTESUR in the last 72 hours.  Invalid input(s): APPERANCEUR    Imaging:  No results found.   Assessment & Plan: Mr. RANDELL TEARE is a 32 y.o. white male with diabetes mellitus type I, diabetic  neuropathy, depression, anxiety, status post bilateral toe amputations, hypertension , who was admitted to [facility] on 04/27/2018 for Dehydration [E86.0] AKI (acute kidney injury) ([place]) [N17.9] Nausea and vomiting [R11.2] Anemia, unspecified type [D64.9] Intractable vomiting with nausea, unspecified vomiting type [R11.2]  1. Acute Renal Failure on chronic kidney disease stage III with proteinuria. Baseline creatinine of 1.5, GFR of 55 on 01/16/18.  Acute renal failure secondary to prerenal azotemia and possible ATN Chronic kidney disease secondary to  diabetic nephropathy.  - not currently on an ACE-I/ARB: lisinopril '10mg'$  at home.  - Continue IV fluids - labs reviewed with patient.   2. Hypertension: 131/87.  - amlodipine.  3. Diabetes mellitus type I: with chronic kidney disease: insulin dependent. Poor control. Hemoglobin A1c of 12.9% - Continue glucose control.   4. Anemia with chronic kidney disease: status post 1 unit PRBC on 10/27  LOS: 3 Azucena Dart 10/28/20193:56 PM

## 2018-05-01 NOTE — Care Management (Signed)
Barrier- worsening creatinine.  When that improves with IV fluids, patient's hemoglobin drops.

## 2018-05-02 ENCOUNTER — Inpatient Hospital Stay: Payer: Medicaid Other | Admitting: Infectious Diseases

## 2018-05-02 LAB — CBC
HEMATOCRIT: 28.6 % — AB (ref 39.0–52.0)
Hemoglobin: 8.8 g/dL — ABNORMAL LOW (ref 13.0–17.0)
MCH: 26 pg (ref 26.0–34.0)
MCHC: 30.8 g/dL (ref 30.0–36.0)
MCV: 84.6 fL (ref 80.0–100.0)
Platelets: 264 10*3/uL (ref 150–400)
RBC: 3.38 MIL/uL — AB (ref 4.22–5.81)
RDW: 15.5 % (ref 11.5–15.5)
WBC: 7 10*3/uL (ref 4.0–10.5)
nRBC: 0 % (ref 0.0–0.2)

## 2018-05-02 LAB — GLUCOSE, CAPILLARY
GLUCOSE-CAPILLARY: 64 mg/dL — AB (ref 70–99)
GLUCOSE-CAPILLARY: 99 mg/dL (ref 70–99)
Glucose-Capillary: 58 mg/dL — ABNORMAL LOW (ref 70–99)
Glucose-Capillary: 152 mg/dL — ABNORMAL HIGH (ref 70–99)

## 2018-05-02 LAB — SEDIMENTATION RATE: Sed Rate: 71 mm/hr — ABNORMAL HIGH (ref 0–15)

## 2018-05-02 LAB — BASIC METABOLIC PANEL
ANION GAP: 6 (ref 5–15)
BUN: 28 mg/dL — ABNORMAL HIGH (ref 6–20)
CO2: 28 mmol/L (ref 22–32)
CREATININE: 1.87 mg/dL — AB (ref 0.61–1.24)
Calcium: 8.2 mg/dL — ABNORMAL LOW (ref 8.9–10.3)
Chloride: 107 mmol/L (ref 98–111)
GFR calc Af Amer: 54 mL/min — ABNORMAL LOW (ref >60)
GFR calc non Af Amer: 47 mL/min — ABNORMAL LOW (ref >60)
Glucose, Bld: 77 mg/dL (ref 70–99)
Potassium: 3.8 mmol/L (ref 3.5–5.1)
SODIUM: 141 mmol/L (ref 135–145)

## 2018-05-02 MED ORDER — FLUOXETINE HCL 20 MG PO CAPS: 20.0000 mg | ORAL_CAPSULE | Freq: Every day | ORAL | Status: DC

## 2018-05-02 MED ORDER — PROMETHAZINE HCL 25 MG PO TABS: 12.5000 mg | ORAL_TABLET | Freq: Three times a day (TID) | ORAL | 0 refills | Status: DC | PRN

## 2018-05-02 MED ORDER — AMLODIPINE BESYLATE 10 MG PO TABS: 10.0000 mg | ORAL_TABLET | Freq: Every day | ORAL | 0 refills | Status: DC

## 2018-05-02 MED ORDER — FOLIC ACID 1 MG PO TABS: 1.0000 mg | ORAL_TABLET | Freq: Every day | ORAL | 0 refills | Status: DC

## 2018-05-02 MED ORDER — NOVOLIN R RELION 100 UNIT/ML IJ SOLN: 0.0000 [IU] | Freq: Three times a day (TID) | INTRAMUSCULAR | 0 refills | Status: DC | PRN

## 2018-05-02 MED ORDER — ADULT MULTIVITAMIN W/MINERALS CH: 1.0000 | ORAL_TABLET | Freq: Every day | ORAL | 0 refills | Status: DC

## 2018-05-02 MED ORDER — METOCLOPRAMIDE HCL 10 MG PO TABS: 10.0000 mg | ORAL_TABLET | Freq: Three times a day (TID) | ORAL | 0 refills | Status: DC

## 2018-05-02 MED ORDER — VITAMIN B-12 1000 MCG PO TABS: 1000.0000 ug | ORAL_TABLET | Freq: Every day | ORAL | 0 refills | Status: DC

## 2018-05-02 MED ORDER — OXYCODONE-ACETAMINOPHEN 5-325 MG PO TABS: 1.0000 | ORAL_TABLET | Freq: Four times a day (QID) | ORAL | 0 refills | Status: DC | PRN

## 2018-05-02 NOTE — Progress Notes (Signed)
[facility]  ROUNDING NOTE   Subjective:   Off IV fluids  Creatinine 1.87 (2.3)  Unasyn  Objective:  Vital signs in last 24 hours:  Temp:  [98 F (36.7 C)-98.2 F (36.8 C)] 98.2 F (36.8 C) (10/29 0359) Pulse Rate:  [85-96] 85 (10/29 0359) Resp:  [18] 18 (10/29 0359) BP: (131-159)/(87-104) 131/91 (10/29 0359) SpO2:  [94 %-97 %] 96 % (10/29 0359)  Weight change:  Filed Weights   04/27/18 1024  Weight: 56.7 kg    Intake/Output: I/O last 3 completed shifts: In: 2052.6 [P.O.:420; I.V.:1159.9; IV Piggyback:472.7] Out: 175 [Urine:175]   Intake/Output this shift:  No intake/output data recorded.  Physical Exam: General: NAD,   Head: Normocephalic, atraumatic. Moist oral mucosal membranes  Eyes: Anicteric, PERRL  Neck: Supple, trachea midline  Lungs:  Clear to auscultation  Heart: Regular rate and rhythm  Abdomen:  Soft, nontender,   Extremities:  1+  peripheral edema. Bilateral boots  Neurologic: Nonfocal, moving all four extremities  Skin: No lesions       Basic Metabolic Panel: Recent Labs  Lab 04/28/18 0451 04/29/18 0811 04/30/18 0530 04/30/18 1539 05/01/18 0720 05/02/18 0611  NA 139 141 140  --  137 141  K 3.8 3.4* 3.2*  --  4.1 3.8  CL 109 107 106  --  105 107  CO2 25 25 28   --  25 28  GLUCOSE 120* 82 66*  --  147* 77  BUN 20 14 20   --  31* 28*  CREATININE 1.63* 1.47* 1.84* 1.75* 2.30* 1.87*  CALCIUM 8.3* 8.5* 8.4*  --  8.1* 8.2*    Liver Function Tests: Recent Labs  Lab 04/27/18 1035  AST 10*  ALT 11  ALKPHOS 87  BILITOT 0.3  PROT 6.5  ALBUMIN 2.6*   Recent Labs  Lab 04/27/18 1035  LIPASE 38   No results for input(s): AMMONIA in the last 168 hours.  CBC: Recent Labs  Lab 04/27/18 1035 04/28/18 0451 04/29/18 0811 04/30/18 0530 04/30/18 1539 05/01/18 0720 05/02/18 0611  WBC 9.0 6.1 7.9  --   --   --  7.0  HGB 7.7* 6.3* 8.1* 7.5* 8.6* 8.1* 8.8*  HCT 24.8* 21.2* 26.7*  --   --   --  28.6*  MCV 85.8 86.9 83.2   --   --   --  84.6  PLT 314 245 320  --   --   --  264    Cardiac Enzymes: No results for input(s): CKTOTAL, CKMB, CKMBINDEX, TROPONINI in the last 168 hours.  BNP: Invalid input(s): POCBNP  CBG: Recent Labs  Lab 05/01/18 2100 05/02/18 0730 05/02/18 0759 05/02/18 0828 05/02/18 1158  GLUCAP 203* 58* 64* 99 152*    Microbiology: Results for orders placed or performed during the hospital encounter of 04/27/18  Gastrointestinal Panel by PCR , Stool     Status: None   Collection Time: 04/27/18  6:45 PM  Result Value Ref Range Status   Campylobacter species NOT DETECTED NOT DETECTED Final   Plesimonas shigelloides NOT DETECTED NOT DETECTED Final   Salmonella species NOT DETECTED NOT DETECTED Final   Yersinia enterocolitica NOT DETECTED NOT DETECTED Final   Vibrio species NOT DETECTED NOT DETECTED Final   Vibrio cholerae NOT DETECTED NOT DETECTED Final   Enteroaggregative E coli (EAEC) NOT DETECTED NOT DETECTED Final   Enteropathogenic E coli (EPEC) NOT DETECTED NOT DETECTED Final   Enterotoxigenic E coli (ETEC) NOT DETECTED NOT DETECTED Final   Shiga  like toxin producing E coli (STEC) NOT DETECTED NOT DETECTED Final   Shigella/Enteroinvasive E coli (EIEC) NOT DETECTED NOT DETECTED Final   Cryptosporidium NOT DETECTED NOT DETECTED Final   Cyclospora cayetanensis NOT DETECTED NOT DETECTED Final   Entamoeba histolytica NOT DETECTED NOT DETECTED Final   Giardia lamblia NOT DETECTED NOT DETECTED Final   Adenovirus F40/41 NOT DETECTED NOT DETECTED Final   Astrovirus NOT DETECTED NOT DETECTED Final   Norovirus GI/GII NOT DETECTED NOT DETECTED Final   Rotavirus A NOT DETECTED NOT DETECTED Final   Sapovirus (I, II, IV, and V) NOT DETECTED NOT DETECTED Final    Comment: Performed at [facility], [address] [place], [place] [address]  C Difficile Quick Screen w PCR reflex     Status: None   Collection Time: 04/27/18  6:45 PM  Result Value Ref Range Status   C Diff  antigen NEGATIVE NEGATIVE Final   C Diff toxin NEGATIVE NEGATIVE Final   C Diff interpretation No C. difficile detected.  Final    Comment: Performed at [facility], [address] [place], [place] [address]    Coagulation Studies: No results for input(s): LABPROT, INR in the last 72 hours.  Urinalysis: No results for input(s): COLORURINE, LABSPEC, PHURINE, GLUCOSEU, HGBUR, BILIRUBINUR, KETONESUR, PROTEINUR, UROBILINOGEN, NITRITE, LEUKOCYTESUR in the last 72 hours.  Invalid input(s): APPERANCEUR    Imaging: US Renal  Result Date: 05/01/2018 CLINICAL DATA:  Acute renal failure. EXAM: RENAL / URINARY TRACT ULTRASOUND COMPLETE COMPARISON:  None. FINDINGS: Right Kidney: Length: 12.2 cm. Increased parenchymal echogenicity. No mass or hydronephrosis visualized. Left Kidney: Length: 11.3 cm. Increased parenchymal echogenicity. No mass or hydronephrosis visualized. Bladder: Appears normal for degree of bladder distention. Tiny amount of fluid around the right kidney. IMPRESSION: Normal size kidneys without hydronephrosis. Bilateral increased cortical echogenicity most commonly due to medical renal disease. Electronically Signed   By: Marin Olp M.D.   On: 05/01/2018 19:01     Medications:   . ampicillin-sulbactam (UNASYN) IV 3 g (05/02/18 1155)   . amLODipine  10 mg Oral Daily  . cyanocobalamin  1,000 mcg Intramuscular Daily  . feeding supplement (GLUCERNA SHAKE)  237 mL Oral TID BM  . ferrous sulfate  325 mg Oral BID WC  . FLUoxetine  20 mg Oral QHS  . folic acid  1 mg Oral Daily  . insulin aspart  0-5 Units Subcutaneous QHS  . insulin aspart  0-9 Units Subcutaneous TID WC  . insulin glargine  20 Units Subcutaneous QHS  . metoCLOPramide (REGLAN) injection  10 mg Intravenous Q6H  . multivitamin with minerals  1 tablet Oral Daily   acetaminophen **OR** acetaminophen, ALPRAZolam, diphenoxylate-atropine, oxyCODONE-acetaminophen, promethazine, traMADol,  zolpidem  Assessment/ Plan:  [name] is a 32 y.o. white male with diabetes mellitus type I, diabetic neuropathy, depression, anxiety, status post bilateral toe amputations, hypertension , who was admitted to [facility] on 04/27/2018    1. Acute Renal Failure on chronic kidney disease stage III with proteinuria. Baseline creatinine of 1.5, GFR of 55 on 01/16/18.  Acute renal failure secondary to prerenal azotemia and possible ATN Chronic kidney disease secondary to diabetic nephropathy.  - not currently on an ACE-I/ARB: lisinopril 10mg  at home.   2. Hypertension:   - amlodipine.  3. Diabetes mellitus type I: with chronic kidney disease: insulin dependent. Poor control. Hemoglobin A1c of 12.9% - Continue glucose control.   4. Anemia with chronic kidney disease: status post 2 units PRBC since  admission  Hospital Follow up with Nephrology, Dr. Juleen China 11/13 at 9:30am   LOS: 4 Vito Beg 10/29/201912:17 PM

## 2018-05-02 NOTE — Treatment Plan (Signed)
Diabetic foot infection /osteomyelitis Baseline Creatinine 1.52  Culture Result: strep intermedius       Allergies  Allergen Reactions  . Banana Hives, Nausea And Vomiting and Rash  . Keflex [Cephalexin] Rash    No swelling- also taken penicillin without any issue.  . Onion Hives, Nausea And Vomiting and Rash  . Sulfa Antibiotics Anaphylaxis  . Grapeseed Extract [Nutritional Supplements] Itching  . Shellfish Allergy Hives    "ALL SEAFOOD"    OPAT Orders Discharge antibiotics: Unasyn 12 grams continuous infusion  every 24 hrs  End Date:05/19/18   PIC Care Per Protocol:  Labs weekly while on IV antibiotics: _X_ CBC with differential __X CMP _X_ CRP X__ ESR  __ Please pull PIC at completion of IV antibiotics   Fax weekly labs to [phone]  Call with critical value or any questions [phone]  Clinic Follow Up Appt in 10 days

## 2018-05-02 NOTE — Progress Notes (Signed)
Inpatient Diabetes Program Recommendations  AACE/ADA: New Consensus Statement on Inpatient Glycemic Control (2015)  Target Ranges:  Prepandial:   less than 140 mg/dL      Peak postprandial:   less than 180 mg/dL (1-2 hours)      Critically ill patients:  140 - 180 mg/dL   Results for Shawn [name], Shawn [name] (MRN [id number]) as of 05/02/2018 09:33  Ref. Range 05/02/2018 07:30 05/02/2018 07:59  Glucose-Capillary Latest Ref Range: 70 - 99 mg/dL 58 (L) 64 (L)    Admit with: Intractable nausea vomiting and diarrhea/ Acute Kidney Injury  History: Type 1 DM  Home DM Meds: Lantus 20 units QHS                             Novolin R (Regular) 0-10 units TID per SSI  Current Orders: Lantus 20 units QHS                            Novolog Sensitive Correction Scale/ SSI (0-9 units) TID AC + HS    MD- Patient with Hypoglycemia this AM after receiving 20 units Lantus last PM.  May consider slight reduction of dose to Lantus 16 units QHS (20% reduction)     --Will follow patient during hospitalization--  Wyn Quaker RN, MSN, CDE Diabetes Coordinator Inpatient Glycemic Control Team Team Pager: [phone] (8a-5p)

## 2018-05-02 NOTE — Plan of Care (Signed)
  Problem: Pain Managment: Goal: General experience of comfort will improve 05/02/2018 0056 by Oris Drone, RN Outcome: Not Progressing 05/02/2018 0056 by Oris Drone, RN Outcome: Progressing  Pt verbalized need for PRN Percocet this shift for left foot pain

## 2018-05-02 NOTE — Plan of Care (Signed)

## 2018-05-02 NOTE — Discharge Summary (Signed)
[facility] NAME: Shawn [name]    MR#:  [id number]  DATE OF BIRTH:  January 05, 1986  DATE OF ADMISSION:  04/27/2018 ADMITTING PHYSICIAN: Henreitta Leber, MD  DATE OF DISCHARGE: 05/02/2018  2:07 PM  PRIMARY CARE PHYSICIAN: Valera Castle, MD    ADMISSION DIAGNOSIS:  Dehydration [E86.0] AKI (acute kidney injury) ([place]) [N17.9] Nausea and vomiting [R11.2] Anemia, unspecified type [D64.9] Intractable vomiting with nausea, unspecified vomiting type [R11.2]  DISCHARGE DIAGNOSIS:  Active Problems:   Acute renal failure (ARF) ([place])   SECONDARY DIAGNOSIS:   Past Medical History:  Diagnosis Date  . Diabetes mellitus without complication ([place])   . GERD (gastroesophageal reflux disease)   . IBS (irritable bowel syndrome)     HOSPITAL COURSE:   1.  Acute kidney injury on chronic kidney disease stage III.  Patient's creatinine has improved today.  Creatinine down to 1.87.  Patients baseline creatinine anywhere from 1.37-1.47.  Follow-up with nephrology as outpatient.  Patient initially had nausea vomiting and diarrhea.  ACE inhibitor was held.  Can potentially restart as outpatient. 2.  Nausea vomiting diarrhea this has resolved. 3.  Anemia.  The patient received a few units of packed red blood cells while here.  Hemoglobin up to 8.8 upon discharge.  The patient is also vitamin B12 deficient.  I gave IM B12 injections while here.  Can consider IM B12 injections as outpatient.  Oral B12 prescribed upon getting out of the hospital. 4.  Type 2 diabetes mellitus.  Continue low-dose Lantus and sliding scale insulin. 5.  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  Continue IV Unasyn through 05/19/2018. 6.  Depression anxiety on Prozac and Xanax  DISCHARGE CONDITIONS:   Fair  CONSULTS OBTAINED:  Treatment Team:  Lavonia Dana, MD  DRUG ALLERGIES:   Allergies  Allergen Reactions  . Banana Hives, Nausea And Vomiting and Rash  .  Keflex [Cephalexin] Rash    No swelling- also taken penicillin without any issue.  . Onion Hives, Nausea And Vomiting and Rash  . Sulfa Antibiotics Anaphylaxis  . Grapeseed Extract [Nutritional Supplements] Itching  . Shellfish Allergy Hives    "ALL SEAFOOD"    DISCHARGE MEDICATIONS:   Allergies as of 05/02/2018      Reactions   Banana Hives, Nausea And Vomiting, Rash   Keflex [cephalexin] Rash   No swelling- also taken penicillin without any issue.   Onion Hives, Nausea And Vomiting, Rash   Sulfa Antibiotics Anaphylaxis   Grapeseed Extract [nutritional Supplements] Itching   Shellfish Allergy Hives   "ALL SEAFOOD"      Medication List    STOP taking these medications   lisinopril 10 MG tablet Commonly known as:  PRINIVIL,ZESTRIL     TAKE these medications   ALPRAZolam 0.25 MG tablet Commonly known as:  XANAX Take 1 tablet (0.25 mg total) by mouth 3 (three) times daily as needed for anxiety.   amLODipine 10 MG tablet Commonly known as:  NORVASC Take 1 tablet (10 mg total) by mouth daily. Start taking on:  05/03/2018   ampicillin-sulbactam  IVPB Commonly known as:  UNASYN Inject 12 g into the vein daily. As continuous infusion Indication:  Diabetic foot/osteomyelitis Last Day of Therapy: 05/19/18 Labs - Once weekly:  CBC/D, CMP, CRP, ESR   diphenoxylate-atropine 2.5-0.025 MG tablet Commonly known as:  LOMOTIL Take 1 tablet by mouth 4 (four) times daily as needed for diarrhea or loose stools.   feeding supplement ([place])  Liqd Take 237 mLs by mouth 3 (three) times daily between meals.   ferrous sulfate 325 (65 FE) MG tablet Take 1 tablet (325 mg total) by mouth 2 (two) times daily with a meal.   FLUoxetine 20 MG capsule Commonly known as:  PROZAC Take 1 capsule (20 mg total) by mouth at bedtime.   folic acid 1 MG tablet Commonly known as:  FOLVITE Take 1 tablet (1 mg total) by mouth daily. Start taking on:  05/03/2018   insulin glargine 100  UNIT/ML injection Commonly known as:  LANTUS Inject 0.2 mLs (20 Units total) into the skin at bedtime.   Insulin Syringes (Disposable) U-100 0.3 ML Misc 1 Syringe by Does not apply route 4 (four) times daily -  with meals and at bedtime.   metoCLOPramide 10 MG tablet Commonly known as:  REGLAN Take 1 tablet (10 mg total) by mouth 3 (three) times daily with meals.   multivitamin with minerals Tabs tablet Take 1 tablet by mouth daily. Start taking on:  05/03/2018   NOVOLIN R RELION 100 units/mL injection Generic drug:  insulin regular Inject 0-0.1 mLs (0-10 Units total) into the skin 3 (three) times daily as needed for high blood sugar (sliding scale).   oxyCODONE-acetaminophen 5-325 MG tablet Commonly known as:  PERCOCET/ROXICET Take 1 tablet by mouth every 6 (six) hours as needed. What changed:  reasons to take this   promethazine 25 MG tablet Commonly known as:  PHENERGAN Take 0.5 tablets (12.5 mg total) by mouth every 8 (eight) hours as needed for nausea or vomiting.   vitamin B-12 1000 MCG tablet Commonly known as:  CYANOCOBALAMIN Take 1 tablet (1,000 mcg total) by mouth daily.   zolpidem 5 MG tablet Commonly known as:  AMBIEN Take 1 tablet (5 mg total) by mouth at bedtime as needed for sleep.        DISCHARGE INSTRUCTIONS:   Follow-up with podiatry Dr. Caryl Comes 1 week Follow-up with infectious disease in 2 weeks Follow-up with nephrology 3 weeks Follow-up PMD 5 days  If you experience worsening of your admission symptoms, develop shortness of breath, life threatening emergency, suicidal or homicidal thoughts you must seek medical attention immediately by calling 911 or calling your MD immediately  if symptoms less severe.  You Must read complete instructions/literature along with all the possible adverse reactions/side effects for all the Medicines you take and that have been prescribed to you. Take any new Medicines after you have completely understood and accept  all the possible adverse reactions/side effects.   Please note  You were cared for by a hospitalist during your hospital stay. If you have any questions about your discharge medications or the care you received while you were in the hospital after you are discharged, you can call the unit and asked to speak with the hospitalist on call if the hospitalist that took care of you is not available. Once you are discharged, your primary care physician will handle any further medical issues. Please note that NO REFILLS for any discharge medications will be authorized once you are discharged, as it is imperative that you return to your primary care physician (or establish a relationship with a primary care physician if you do not have one) for your aftercare needs so that they can reassess your need for medications and monitor your lab values.    Today   CHIEF COMPLAINT:   Chief Complaint  Patient presents with  . Abnormal Lab    HISTORY OF PRESENT ILLNESS:  Shawn [name]  is a 32 y.o. male sent in for low hemoglobin and nausea vomiting   VITAL SIGNS:  Blood pressure (!) 131/91, pulse 85, temperature 98.2 F (36.8 C), temperature source Oral, resp. rate 18, height '5\' 6"'$  (1.676 m), weight 56.7 kg, SpO2 96 %.   PHYSICAL EXAMINATION:  GENERAL:  32 y.o.-year-old patient lying in the bed with no acute distress.  EYES: Pupils equal, round, reactive to light and accommodation. No scleral icterus. Extraocular muscles intact.  HEENT: Head atraumatic, normocephalic. Oropharynx and nasopharynx clear.  NECK:  Supple, no jugular venous distention. No thyroid enlargement, no tenderness.  LUNGS: Normal breath sounds bilaterally, no wheezing, rales,rhonchi or crepitation. No use of accessory muscles of respiration.  CARDIOVASCULAR: S1, S2 normal. No murmurs, rubs, or gallops.  ABDOMEN: Soft, non-tender, non-distended. Bowel sounds present. No organomegaly or mass.  EXTREMITIES: No pedal edema, cyanosis,  or clubbing.  NEUROLOGIC: Cranial nerves II through XII are intact. Muscle strength 5/5 in all extremities. Sensation intact. Gait not checked.  PSYCHIATRIC: The patient is alert and oriented x 3.  SKIN: Left foot covered.  DATA REVIEW:   CBC Recent Labs  Lab 05/02/18 0611  WBC 7.0  HGB 8.8*  HCT 28.6*  PLT 264    Chemistries  Recent Labs  Lab 04/27/18 1035  05/02/18 0611  NA 135   < > 141  K 4.5   < > 3.8  CL 100   < > 107  CO2 24   < > 28  GLUCOSE 453*   < > 77  BUN 27*   < > 28*  CREATININE 2.40*   < > 1.87*  CALCIUM 8.4*   < > 8.2*  AST 10*  --   --   ALT 11  --   --   ALKPHOS 87  --   --   BILITOT 0.3  --   --    < > = values in this interval not displayed.     Microbiology Results  Results for orders placed or performed during the hospital encounter of 04/27/18  Gastrointestinal Panel by PCR , Stool     Status: None   Collection Time: 04/27/18  6:45 PM  Result Value Ref Range Status   Campylobacter species NOT DETECTED NOT DETECTED Final   Plesimonas shigelloides NOT DETECTED NOT DETECTED Final   Salmonella species NOT DETECTED NOT DETECTED Final   Yersinia enterocolitica NOT DETECTED NOT DETECTED Final   Vibrio species NOT DETECTED NOT DETECTED Final   Vibrio cholerae NOT DETECTED NOT DETECTED Final   Enteroaggregative E coli (EAEC) NOT DETECTED NOT DETECTED Final   Enteropathogenic E coli (EPEC) NOT DETECTED NOT DETECTED Final   Enterotoxigenic E coli (ETEC) NOT DETECTED NOT DETECTED Final   Shiga like toxin producing E coli (STEC) NOT DETECTED NOT DETECTED Final   Shigella/Enteroinvasive E coli (EIEC) NOT DETECTED NOT DETECTED Final   Cryptosporidium NOT DETECTED NOT DETECTED Final   Cyclospora cayetanensis NOT DETECTED NOT DETECTED Final   Entamoeba histolytica NOT DETECTED NOT DETECTED Final   Giardia lamblia NOT DETECTED NOT DETECTED Final   Adenovirus F40/41 NOT DETECTED NOT DETECTED Final   Astrovirus NOT DETECTED NOT DETECTED Final    Norovirus GI/GII NOT DETECTED NOT DETECTED Final   Rotavirus A NOT DETECTED NOT DETECTED Final   Sapovirus (I, II, IV, and V) NOT DETECTED NOT DETECTED Final    Comment: Performed at [facility], [address], [place], [place] [address]  C Difficile Quick  Screen w PCR reflex     Status: None   Collection Time: 04/27/18  6:45 PM  Result Value Ref Range Status   C Diff antigen NEGATIVE NEGATIVE Final   C Diff toxin NEGATIVE NEGATIVE Final   C Diff interpretation No C. difficile detected.  Final    Comment: Performed at [facility], [address] [place], [place] [address]    RADIOLOGY:  US Renal  Result Date: 05/01/2018 CLINICAL DATA:  Acute renal failure. EXAM: RENAL / URINARY TRACT ULTRASOUND COMPLETE COMPARISON:  None. FINDINGS: Right Kidney: Length: 12.2 cm. Increased parenchymal echogenicity. No mass or hydronephrosis visualized. Left Kidney: Length: 11.3 cm. Increased parenchymal echogenicity. No mass or hydronephrosis visualized. Bladder: Appears normal for degree of bladder distention. Tiny amount of fluid around the right kidney. IMPRESSION: Normal size kidneys without hydronephrosis. Bilateral increased cortical echogenicity most commonly due to medical renal disease. Electronically Signed   By: Marin Olp M.D.   On: 05/01/2018 19:01    Management plans discussed with the patient, and he is in agreement.  CODE STATUS:     Code Status Orders  (From admission, onward)         Start     Ordered   04/27/18 1727  Full code  Continuous     04/27/18 1726        Code Status History    Date Active Date Inactive Code Status Order ID Comments User Context   04/05/2018 1724 04/11/2018 2142 Full Code [id number]  Dustin Flock, MD Inpatient   02/06/2018 2022 02/07/2018 1512 Full Code [id number]  Demetrios Loll, MD Inpatient   01/10/2018 2259 01/11/2018 1947 Full Code [id number]  Amelia Jo, MD Inpatient   12/30/2017 0019 01/03/2018 1457 Full Code [id number]  Harrie Foreman, MD Inpatient   12/20/2017 1541 12/27/2017 2147 Full Code [id number]  Sharlotte Alamo, DPM Inpatient   11/10/2017 0049 11/11/2017 1632 Full Code [id number]  Amelia Jo, MD Inpatient   07/13/2017 2112 07/15/2017 1730 Full Code [id number]  Jules Husbands, MD ED      TOTAL TIME TAKING CARE OF THIS PATIENT: 35 minutes.    Loletha Grayer M.D on 05/02/2018 at 3:46 PM  Between 7am to 6pm - Pager - [phone]  After 6pm go to [email or web address] - password [email or web address] [facility]  [phone]  CC: Primary care physician; Valera Castle, MD

## 2018-05-02 NOTE — Progress Notes (Signed)
    ID: JVON MERONEY is a 32 y.o. male with h/o Diabetes, DFI, CKD, anemia  Anemia Left foot infection  4th and 5th toe ray excision first week of October  04/06/18 Aortogram- Left Lower Extremity: This demonstrated normal common femoral artery, profunda femoris artery, superficial femoral artery, and popliteal artery.  The anterior tibial artery was in the dominant runoff into the foot was continuous all the way into the foot providing good blood flow to the metatarsals.  The posterior tibial artery was also patent to the foot although smaller.  Subjective: Feeling better 'no fever appetite better  Medications:  . amLODipine  10 mg Oral Daily  . cyanocobalamin  1,000 mcg Intramuscular Daily  . feeding supplement (GLUCERNA SHAKE)  237 mL Oral TID BM  . ferrous sulfate  325 mg Oral BID WC  . FLUoxetine  20 mg Oral QHS  . folic acid  1 mg Oral Daily  . insulin aspart  0-5 Units Subcutaneous QHS  . insulin aspart  0-9 Units Subcutaneous TID WC  . insulin glargine  20 Units Subcutaneous QHS  . metoCLOPramide (REGLAN) injection  10 mg Intravenous Q6H  . multivitamin with minerals  1 tablet Oral Daily    Objective: Vital signs in last 24 hours: Temp:  [98 F (36.7 C)-98.2 F (36.8 C)] 98.2 F (36.8 C) (10/29 0359) Pulse Rate:  [85-96] 85 (10/29 0359) Resp:  [18] 18 (10/29 0359) BP: (131-159)/(87-104) 131/91 (10/29 0359) SpO2:  [94 %-97 %] 96 % (10/29 0359)   Awkae, alert Chest b/l air entry decreased rt base Left fppt     Full thickness open wound to the level of tendon. Minimal erythema and swelling Continue wet to dry dressing as per Dr.Kilne Continue unasyn 05/19/18  Lab Results Recent Labs    05/01/18 0720 05/02/18 0611  WBC  --  7.0  HGB 8.1* 8.8*  HCT  --  28.6*  NA 137 141  K 4.1 3.8  CL 105 107  CO2 25 28  BUN 31* 28*  CREATININE 2.30* 1.87*   Liver Panel No results for input(s): PROT, ALBUMIN, AST, ALT, ALKPHOS, BILITOT, BILIDIR, IBILI in  the last 72 hours. Sedimentation Rate Recent Labs    05/02/18 0611  ESRSEDRATE 71*   C-Reactive Protein No results for input(s): CRP in the last 72 hours.  Microbiology:  Studies/Results: US Renal  Result Date: 05/01/2018 CLINICAL DATA:  Acute renal failure. EXAM: RENAL / URINARY TRACT ULTRASOUND COMPLETE COMPARISON:  None. FINDINGS: Right Kidney: Length: 12.2 cm. Increased parenchymal echogenicity. No mass or hydronephrosis visualized. Left Kidney: Length: 11.3 cm. Increased parenchymal echogenicity. No mass or hydronephrosis visualized. Bladder: Appears normal for degree of bladder distention. Tiny amount of fluid around the right kidney. IMPRESSION: Normal size kidneys without hydronephrosis. Bilateral increased cortical echogenicity most commonly due to medical renal disease. Electronically Signed   By: Marin Olp M.D.   On: 05/01/2018 19:01     Assessment/Plan:  Juanetta Gosling- continue until 05/19/18 as planned Left foot diabtic infection with osteo and soft tissue infection Follow up in my clinic in 7-10 days  DM Insulin Anemia CKD  Orris Perin 05/02/2018, 10:53 AM

## 2018-05-02 NOTE — Progress Notes (Signed)
Patient discharged home with home health. Prescription given to patient. PICC in place and flushing. All discharge instructions given and all questions answered.

## 2018-05-06 ENCOUNTER — Other Ambulatory Visit: Payer: Self-pay

## 2018-05-06 ENCOUNTER — Emergency Department
Admission: EM | Admit: 2018-05-06 | Discharge: 2018-05-06 | Disposition: A | Discharge status: Home / Self Care | Payer: Medicaid Other | Attending: Emergency Medicine | Admitting: Emergency Medicine

## 2018-05-06 ENCOUNTER — Encounter: Payer: Self-pay | Admitting: Emergency Medicine

## 2018-05-06 DIAGNOSIS — F1610 Hallucinogen abuse, uncomplicated: Secondary | ICD-10-CM | POA: Insufficient documentation

## 2018-05-06 DIAGNOSIS — Z79899 Other long term (current) drug therapy: Secondary | ICD-10-CM | POA: Insufficient documentation

## 2018-05-06 DIAGNOSIS — Z794 Long term (current) use of insulin: Secondary | ICD-10-CM | POA: Insufficient documentation

## 2018-05-06 DIAGNOSIS — E119 Type 2 diabetes mellitus without complications: Secondary | ICD-10-CM | POA: Insufficient documentation

## 2018-05-06 DIAGNOSIS — R112 Nausea with vomiting, unspecified: Secondary | ICD-10-CM | POA: Insufficient documentation

## 2018-05-06 DIAGNOSIS — R1084 Generalized abdominal pain: Secondary | ICD-10-CM | POA: Insufficient documentation

## 2018-05-06 DIAGNOSIS — F1210 Cannabis abuse, uncomplicated: Secondary | ICD-10-CM | POA: Insufficient documentation

## 2018-05-06 LAB — LIPASE, BLOOD: Lipase: 22 U/L (ref 11–51)

## 2018-05-06 LAB — COMPREHENSIVE METABOLIC PANEL
ALBUMIN: 2.8 g/dL — AB (ref 3.5–5.0)
ALT: 31 U/L (ref 0–44)
AST: 26 U/L (ref 15–41)
Alkaline Phosphatase: 137 U/L — ABNORMAL HIGH (ref 38–126)
Anion gap: 11 (ref 5–15)
BUN: 20 mg/dL (ref 6–20)
CHLORIDE: 104 mmol/L (ref 98–111)
CO2: 29 mmol/L (ref 22–32)
Calcium: 9.1 mg/dL (ref 8.9–10.3)
Creatinine, Ser: 1.59 mg/dL — ABNORMAL HIGH (ref 0.61–1.24)
GFR calc Af Amer: >60 mL/min (ref >60)
GFR calc non Af Amer: 57 mL/min — ABNORMAL LOW (ref >60)
GLUCOSE: 210 mg/dL — AB (ref 70–99)
POTASSIUM: 3.5 mmol/L (ref 3.5–5.1)
SODIUM: 144 mmol/L (ref 135–145)
Total Bilirubin: 0.5 mg/dL (ref 0.3–1.2)
Total Protein: 7.2 g/dL (ref 6.5–8.1)

## 2018-05-06 LAB — BETA-HYDROXYBUTYRIC ACID: BETA-HYDROXYBUTYRIC ACID: 0.15 mmol/L (ref 0.05–0.27)

## 2018-05-06 LAB — GLUCOSE, CAPILLARY: Glucose-Capillary: 178 mg/dL — ABNORMAL HIGH (ref 70–99)

## 2018-05-06 LAB — CBC
HEMATOCRIT: 31.4 % — AB (ref 39.0–52.0)
HEMOGLOBIN: 9.9 g/dL — AB (ref 13.0–17.0)
MCH: 26.5 pg (ref 26.0–34.0)
MCHC: 31.5 g/dL (ref 30.0–36.0)
MCV: 84 fL (ref 80.0–100.0)
Platelets: 339 10*3/uL (ref 150–400)
RBC: 3.74 MIL/uL — ABNORMAL LOW (ref 4.22–5.81)
RDW: 15.1 % (ref 11.5–15.5)
WBC: 8 10*3/uL (ref 4.0–10.5)
nRBC: 0 % (ref 0.0–0.2)

## 2018-05-06 MED ORDER — SODIUM CHLORIDE 0.9 % IV BOLUS
1000.0000 mL | Freq: Once | INTRAVENOUS | Status: AC
  Administered 2018-05-06: 1000 mL via INTRAVENOUS

## 2018-05-06 MED ORDER — ONDANSETRON HCL 4 MG/2ML IJ SOLN
4.0000 mg | Freq: Once | INTRAMUSCULAR | Status: AC
  Administered 2018-05-06: 4 mg via INTRAVENOUS
  Filled 2018-05-06: qty 2

## 2018-05-06 MED ORDER — MORPHINE SULFATE (PF) 4 MG/ML IV SOLN
4.0000 mg | Freq: Once | INTRAVENOUS | Status: AC
  Administered 2018-05-06: 4 mg via INTRAVENOUS
  Filled 2018-05-06: qty 1

## 2018-05-06 MED ORDER — HALOPERIDOL LACTATE 5 MG/ML IJ SOLN
2.0000 mg | Freq: Once | INTRAMUSCULAR | Status: AC
  Administered 2018-05-06: 2 mg via INTRAVENOUS
  Filled 2018-05-06: qty 1

## 2018-05-06 NOTE — ED Triage Notes (Signed)
First Nurse Note:  C/O N/V/D x 1 day.  Patient is on home antibiotics for ?bone infection.  Has a Right PICC line.

## 2018-05-06 NOTE — ED Provider Notes (Addendum)
[facility] Emergency Department Provider Note  ____________________________________________   First MD Initiated Contact with Patient 05/06/18 [phone]     (approximate)  I have reviewed the triage vital signs and the nursing notes.   HISTORY  Chief Complaint Emesis   HPI Shawn [name] is a 32 y.o. 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  Does not report diarrhea.  States moderate to severe diffuse abdominal pain which has been associated with the abdominal pain.  No blood in the vomitus.  Patient says that he has been compliant with his diabetes medications.   Past Medical History:  Diagnosis Date  . Diabetes mellitus without complication ([place])   . GERD (gastroesophageal reflux disease)   . IBS (irritable bowel syndrome)     Patient Active Problem List   Diagnosis Date Noted  . Acute renal failure (ARF) ([place]) 04/27/2018  . Left foot infection 04/05/2018  . Foot infection 02/06/2018  . Osteomyelitis ([place]) 01/10/2018  . Cellulitis 12/29/2017  . Sepsis ([place]) 12/18/2017  . Moderate recurrent major depression ([place]) 11/10/2017  . Type 2 diabetes mellitus with hyperosmolar nonketotic hyperglycemia ([place]) 11/09/2017  . History of migraine 07/15/2017  . IBS (irritable bowel syndrome) 07/15/2017  . Protein-calorie malnutrition, severe 07/14/2017  . Abdominal pain 07/13/2017  . GERD (gastroesophageal reflux disease) 12/30/2009  . Diabetes ([place]) 11/25/2009  . MDD (major depressive disorder) 11/25/2009  . Hypercholesterolemia 03/07/2008    Past Surgical History:  Procedure Laterality Date  . ABDOMINAL AORTOGRAM W/LOWER EXTREMITY Right 12/23/2017   Procedure: ABDOMINAL AORTOGRAM W/LOWER EXTREMITY;  Surgeon: Katha Cabal, MD;  Location: [place] [facility];  Service: Cardiovascular;  Laterality: Right;  . AMPUTATION Right  12/24/2017   Procedure: AMPUTATION RAY;  Surgeon: Sharlotte Alamo, DPM;  Location: [facility];  Service: Podiatry;  Laterality: Right;  . AMPUTATION Left 04/06/2018   Procedure: AMPUTATION RAY;  Surgeon: Sharlotte Alamo, DPM;  Location: [facility];  Service: Podiatry;  Laterality: Left;  . AMPUTATION Left 04/09/2018   Procedure: AMPUTATION RAY;  Surgeon: Sharlotte Alamo, DPM;  Location: [facility];  Service: Podiatry;  Laterality: Left;  . APPLICATION OF WOUND VAC Right 12/24/2017   Procedure: APPLICATION OF WOUND VAC;  Surgeon: Sharlotte Alamo, DPM;  Location: [facility];  Service: Podiatry;  Laterality: Right;  . APPLICATION OF WOUND VAC Right 01/01/2018   Procedure: APPLICATION OF WOUND VAC;  Surgeon: Albertine Patricia, DPM;  Location: [facility];  Service: Podiatry;  Laterality: Right;  . IRRIGATION AND DEBRIDEMENT FOOT Right 12/20/2017   Procedure: IRRIGATION AND DEBRIDEMENT FOOT;  Surgeon: Sharlotte Alamo, DPM;  Location: [facility];  Service: Podiatry;  Laterality: Right;  . IRRIGATION AND DEBRIDEMENT FOOT Right 12/24/2017   Procedure: IRRIGATION AND DEBRIDEMENT FOOT;  Surgeon: Sharlotte Alamo, DPM;  Location: [facility];  Service: Podiatry;  Laterality: Right;  . IRRIGATION AND DEBRIDEMENT FOOT Right 01/01/2018   Procedure: IRRIGATION AND DEBRIDEMENT FOOT-SKIN,SOFT TISSUE AND BONE;  Surgeon: Albertine Patricia, DPM;  Location: [facility];  Service: Podiatry;  Laterality: Right;  . IRRIGATION AND DEBRIDEMENT FOOT Left 04/06/2018   Procedure: IRRIGATION AND DEBRIDEMENT FOOT;  Surgeon: Sharlotte Alamo, DPM;  Location: [facility];  Service: Podiatry;  Laterality: Left;  . LOWER EXTREMITY ANGIOGRAPHY Left 04/06/2018   Procedure: Lower Extremity Angiography;  Surgeon: Algernon Huxley, MD;  Location: [place] [facility];  Service: Cardiovascular;  Laterality: Left;  Prior to Admission medications   Medication Sig Start Date End Date Taking? Authorizing Provider  ALPRAZolam (XANAX) 0.25 MG tablet Take 1 tablet (0.25 mg total) by mouth 3 (three) times  daily as needed for anxiety. 12/27/17   Loletha Grayer, MD  amLODipine (NORVASC) 10 MG tablet Take 1 tablet (10 mg total) by mouth daily. 05/03/18   Loletha Grayer, MD  ampicillin-sulbactam (UNASYN) IVPB Inject 12 g into the vein daily. As continuous infusion Indication:  Diabetic foot/osteomyelitis Last Day of Therapy: 05/19/18 Labs - Once weekly:  CBC/D, CMP, CRP, ESR 04/11/18 05/20/18  Henreitta Leber, MD  diphenoxylate-atropine (LOMOTIL) 2.5-0.025 MG tablet Take 1 tablet by mouth 4 (four) times daily as needed for diarrhea or loose stools. 04/11/18 04/11/19  Henreitta Leber, MD  feeding supplement, GLUCERNA SHAKE, (GLUCERNA SHAKE) LIQD Take 237 mLs by mouth 3 (three) times daily between meals. 12/27/17   Loletha Grayer, MD  ferrous sulfate 325 (65 FE) MG tablet Take 1 tablet (325 mg total) by mouth 2 (two) times daily with a meal. 01/03/18   Sainani, Belia Heman, MD  FLUoxetine (PROZAC) 20 MG capsule Take 1 capsule (20 mg total) by mouth at bedtime. 05/02/18   Loletha Grayer, MD  folic acid (FOLVITE) 1 MG tablet Take 1 tablet (1 mg total) by mouth daily. 05/03/18   Loletha Grayer, MD  insulin glargine (LANTUS) 100 UNIT/ML injection Inject 0.2 mLs (20 Units total) into the skin at bedtime. 04/11/18   Henreitta Leber, MD  Insulin Syringes, Disposable, U-100 0.3 ML MISC 1 Syringe by Does not apply route 4 (four) times daily -  with meals and at bedtime. 12/27/17   Loletha Grayer, MD  metoCLOPramide (REGLAN) 10 MG tablet Take 1 tablet (10 mg total) by mouth 3 (three) times daily with meals. 05/02/18 06/01/18  Loletha Grayer, MD  Multiple Vitamin (MULTIVITAMIN WITH MINERALS) TABS tablet Take 1 tablet by mouth daily. 05/03/18   Wieting, Richard, MD  NOVOLIN R RELION 100 UNIT/ML injection Inject 0-0.1 mLs (0-10 Units total) into the skin 3 (three) times daily as needed for high blood sugar (sliding scale). 05/02/18 07/01/18  Loletha Grayer, MD  oxyCODONE-acetaminophen (PERCOCET/ROXICET) 5-325 MG  tablet Take 1 tablet by mouth every 6 (six) hours as needed. 05/02/18   Loletha Grayer, MD  promethazine (PHENERGAN) 25 MG tablet Take 0.5 tablets (12.5 mg total) by mouth every 8 (eight) hours as needed for nausea or vomiting. 05/02/18   Loletha Grayer, MD  vitamin B-12 (CYANOCOBALAMIN) 1000 MCG tablet Take 1 tablet (1,000 mcg total) by mouth daily. 05/02/18   Loletha Grayer, MD  zolpidem (AMBIEN) 5 MG tablet Take 1 tablet (5 mg total) by mouth at bedtime as needed for sleep. 04/11/18 05/11/18  Henreitta Leber, MD    Allergies Banana; Keflex [cephalexin]; Onion; Sulfa antibiotics; Grapeseed extract [nutritional supplements]; and Shellfish allergy  Family History  Problem Relation Age of Onset  . Diabetes Mother   . Ovarian cancer Mother     Social History Social History   Tobacco Use  . Smoking status: Never Smoker  . Smokeless tobacco: Never Used  Substance Use Topics  . Alcohol use: No    Frequency: Never  . Drug use: Yes    Types: Marijuana, PCP    Review of Systems  Constitutional: No fever/chills Eyes: No visual changes. ENT: No sore throat. Cardiovascular: Denies chest pain. Respiratory: Denies shortness of breath. Gastrointestinal:   No diarrhea.  No constipation. Genitourinary: Negative for dysuria. Musculoskeletal: Negative for back  pain. Skin: Negative for rash. Neurological: Negative for headaches, focal weakness or numbness.   ____________________________________________   PHYSICAL EXAM:  VITAL SIGNS: ED Triage Vitals  Enc Vitals Group     BP 05/06/18 0934 (!) 166/97     Pulse Rate 05/06/18 0934 (!) 109     Resp 05/06/18 0934 18     Temp 05/06/18 0934 98.6 F (37 C)     Temp Source 05/06/18 0934 Oral     SpO2 05/06/18 0934 100 %     Weight 05/06/18 0935 125 lb (56.7 kg)     Height 05/06/18 0935 '5\' 6"'$  (1.676 m)     Head Circumference --      Peak Flow --      Pain Score 05/06/18 0935 10     Pain Loc --      Pain Edu? --      Excl. in  [place]? --     Constitutional: Alert and oriented.  Patient actively vomiting in the room.  Clear to yellow vomitus. Eyes: Conjunctivae are normal.  Head: Atraumatic. Nose: No congestion/rhinnorhea. Mouth/Throat: Mucous membranes are moist.  Neck: No stridor.   Cardiovascular: Tachycardic, regular rhythm. Grossly normal heart sounds.  Good peripheral circulation. Respiratory: Normal respiratory effort.  No retractions. Lungs CTAB. Gastrointestinal: Soft with diffuse and moderate tenderness to palpation.  No distention Musculoskeletal: Left lower extremity with foot in the boot.  Wrapped with Ace wrap bandage which is clean dry and intact. Neurologic:  Normal speech and language. No gross focal neurologic deficits are appreciated. Skin:  Skin is warm, dry and intact. No rash noted. Psychiatric: Mood and affect are normal. Speech and behavior are normal.  ____________________________________________   LABS (all labs ordered are listed, but only abnormal results are displayed)  Labs Reviewed  COMPREHENSIVE METABOLIC PANEL - Abnormal; Notable for the following components:      Result Value   Glucose, Bld 210 (*)    Creatinine, Ser 1.59 (*)    Albumin 2.8 (*)    Alkaline Phosphatase 137 (*)    GFR calc non Af Amer 57 (*)    All other components within normal limits  CBC - Abnormal; Notable for the following components:   RBC 3.74 (*)    Hemoglobin 9.9 (*)    HCT 31.4 (*)    All other components within normal limits  GLUCOSE, CAPILLARY - Abnormal; Notable for the following components:   Glucose-Capillary 178 (*)    All other components within normal limits  LIPASE, BLOOD  BETA-HYDROXYBUTYRIC ACID  URINALYSIS, COMPLETE (UACMP) WITH MICROSCOPIC   ____________________________________________  EKG   ____________________________________________  RADIOLOGY   ____________________________________________   PROCEDURES  Procedure(s) performed:   Procedures  Critical Care  performed:   ____________________________________________   INITIAL IMPRESSION / ASSESSMENT AND PLAN / ED COURSE  Pertinent labs & imaging results that were available during my care of the patient were reviewed by me and considered in my medical decision making (see chart for details).  Differential diagnosis includes, but is not limited to, biliary disease (biliary colic, acute cholecystitis, cholangitis, choledocholithiasis, etc), intrathoracic causes for epigastric abdominal pain including ACS, gastritis, duodenitis, pancreatitis, small bowel or large bowel obstruction, abdominal aortic aneurysm, hernia, and ulcer(s). Differential diagnosis includes, but is not limited to, acute appendicitis, renal colic, testicular torsion, urinary tract infection/pyelonephritis, prostatitis,  epididymitis, diverticulitis, small bowel obstruction or ileus, colitis, abdominal aortic aneurysm, gastroenteritis, hernia, etc. As part of my medical decision making, I reviewed the following data within the  electronic MEDICAL RECORD NUMBER Notes from prior ED visits  ----------------------------------------- 11:51 AM on 05/06/2018 -----------------------------------------  Reassuring lab work.  Patient no longer vomiting.  Not tolerating p.o. juice as well as crackers.  Says that his nausea as well as pain have decreased.  Likely recurrence of chronic nausea and vomiting.  Patient says that he has Phenergan to be used at home.  Will be discharged at this time. ____________________________________________   FINAL CLINICAL IMPRESSION(S) / ED DIAGNOSES  Nausea and vomiting.  NEW MEDICATIONS STARTED DURING THIS VISIT:  New Prescriptions   No medications on file     Note:  This document was prepared using Dragon voice recognition software and may include unintentional dictation errors.     Maleah Rabago, Randall An, MD 05/06/18 1152  Patient vomited while being prepped for discharge the first time.  Given 2  mg of Haldol.  Reassessed and has now been able to tolerate p.o. fluids.  Reexamined the abdomen and he is soft and nontender throughout.  Patient to be discharged at this time.  Heart rate at the bedside at the time of reexamination is 86 bpm.    Orbie Pyo, MD 05/06/18 1325

## 2018-05-06 NOTE — ED Triage Notes (Signed)
Nausea and vomiting since yesterday. Patient has PICC line R arm antibiotic infusion for bone infection feet post amputation.

## 2018-05-06 NOTE — ED Notes (Signed)
Upon attempting to d/c, pt c/o abd pain and vomiting. apx 400cc of emesis noted to emesis bag. [place] MD notified and orders obtained.

## 2018-05-11 ENCOUNTER — Telehealth: Payer: Self-pay | Admitting: Licensed Clinical Social Worker

## 2018-05-11 NOTE — Telephone Encounter (Signed)
Len pharmacist from [facility] called about Cr 1.45 and potassium 2.9. Wanted to know if Dr. Delaine Lame wanted to address it. Please advise.

## 2018-05-12 ENCOUNTER — Other Ambulatory Visit: Payer: Self-pay

## 2018-05-12 ENCOUNTER — Emergency Department
Admission: EM | Admit: 2018-05-12 | Discharge: 2018-05-12 | Disposition: A | Discharge status: Home / Self Care | Payer: Medicaid Other | Attending: Emergency Medicine | Admitting: Emergency Medicine

## 2018-05-12 ENCOUNTER — Encounter: Payer: Self-pay | Admitting: Emergency Medicine

## 2018-05-12 DIAGNOSIS — Z794 Long term (current) use of insulin: Secondary | ICD-10-CM | POA: Diagnosis not present

## 2018-05-12 DIAGNOSIS — R030 Elevated blood-pressure reading, without diagnosis of hypertension: Secondary | ICD-10-CM | POA: Insufficient documentation

## 2018-05-12 DIAGNOSIS — R112 Nausea with vomiting, unspecified: Secondary | ICD-10-CM | POA: Diagnosis present

## 2018-05-12 DIAGNOSIS — Z79899 Other long term (current) drug therapy: Secondary | ICD-10-CM | POA: Insufficient documentation

## 2018-05-12 DIAGNOSIS — E119 Type 2 diabetes mellitus without complications: Secondary | ICD-10-CM | POA: Diagnosis not present

## 2018-05-12 DIAGNOSIS — F1210 Cannabis abuse, uncomplicated: Secondary | ICD-10-CM

## 2018-05-12 LAB — URINALYSIS, COMPLETE (UACMP) WITH MICROSCOPIC
BILIRUBIN URINE: NEGATIVE
Bacteria, UA: NONE SEEN
KETONES UR: 5 mg/dL — AB
Leukocytes, UA: NEGATIVE
Nitrite: NEGATIVE
Specific Gravity, Urine: 1.012 (ref 1.005–1.030)
Squamous Epithelial / LPF: NONE SEEN (ref 0–5)
pH: 6 (ref 5.0–8.0)

## 2018-05-12 LAB — COMPREHENSIVE METABOLIC PANEL
ALT: 19 U/L (ref 0–44)
AST: 22 U/L (ref 15–41)
Albumin: 2.8 g/dL — ABNORMAL LOW (ref 3.5–5.0)
Alkaline Phosphatase: 119 U/L (ref 38–126)
Anion gap: 13 (ref 5–15)
BUN: 14 mg/dL (ref 6–20)
CALCIUM: 8.5 mg/dL — AB (ref 8.9–10.3)
CO2: 26 mmol/L (ref 22–32)
Chloride: 99 mmol/L (ref 98–111)
Creatinine, Ser: 1.59 mg/dL — ABNORMAL HIGH (ref 0.61–1.24)
GFR calc non Af Amer: 57 mL/min — ABNORMAL LOW (ref >60)
Glucose, Bld: 195 mg/dL — ABNORMAL HIGH (ref 70–99)
Potassium: 3.2 mmol/L — ABNORMAL LOW (ref 3.5–5.1)
Sodium: 138 mmol/L (ref 135–145)
Total Bilirubin: 0.6 mg/dL (ref 0.3–1.2)
Total Protein: 6.8 g/dL (ref 6.5–8.1)

## 2018-05-12 LAB — CBC
HCT: 34 % — ABNORMAL LOW (ref 39.0–52.0)
Hemoglobin: 10.9 g/dL — ABNORMAL LOW (ref 13.0–17.0)
MCH: 26 pg (ref 26.0–34.0)
MCHC: 32.1 g/dL (ref 30.0–36.0)
MCV: 81.1 fL (ref 80.0–100.0)
NRBC: 0 % (ref 0.0–0.2)
Platelets: 311 10*3/uL (ref 150–400)
RBC: 4.19 MIL/uL — ABNORMAL LOW (ref 4.22–5.81)
RDW: 14.3 % (ref 11.5–15.5)
WBC: 8 10*3/uL (ref 4.0–10.5)

## 2018-05-12 LAB — URINE DRUG SCREEN, QUALITATIVE (ARMC ONLY)
Amphetamines, Ur Screen: NOT DETECTED
BARBITURATES, UR SCREEN: NOT DETECTED
Benzodiazepine, Ur Scrn: NOT DETECTED
CANNABINOID 50 NG, UR ~~LOC~~: POSITIVE — AB
Cocaine Metabolite,Ur ~~LOC~~: NOT DETECTED
MDMA (Ecstasy)Ur Screen: NOT DETECTED
Methadone Scn, Ur: NOT DETECTED
Opiate, Ur Screen: NOT DETECTED
Phencyclidine (PCP) Ur S: NOT DETECTED
Tricyclic, Ur Screen: NOT DETECTED

## 2018-05-12 LAB — GLUCOSE, CAPILLARY: Glucose-Capillary: 179 mg/dL — ABNORMAL HIGH (ref 70–99)

## 2018-05-12 LAB — LIPASE, BLOOD: Lipase: 27 U/L (ref 11–51)

## 2018-05-12 MED ORDER — SODIUM CHLORIDE 0.9 % IV BOLUS
1000.0000 mL | Freq: Once | INTRAVENOUS | Status: AC
  Administered 2018-05-12: 1000 mL via INTRAVENOUS

## 2018-05-12 MED ORDER — POTASSIUM CHLORIDE CRYS ER 20 MEQ PO TBCR
40.0000 meq | EXTENDED_RELEASE_TABLET | Freq: Once | ORAL | Status: AC
  Administered 2018-05-12: 40 meq via ORAL
  Filled 2018-05-12: qty 2

## 2018-05-12 MED ORDER — METOCLOPRAMIDE HCL 10 MG PO TABS: 10.0000 mg | ORAL_TABLET | Freq: Three times a day (TID) | ORAL | 0 refills | Status: DC

## 2018-05-12 MED ORDER — HALOPERIDOL LACTATE 5 MG/ML IJ SOLN
5.0000 mg | Freq: Once | INTRAMUSCULAR | Status: AC
  Administered 2018-05-12: 5 mg via INTRAVENOUS
  Filled 2018-05-12: qty 1

## 2018-05-12 MED ORDER — AMLODIPINE BESYLATE 5 MG PO TABS
10.0000 mg | ORAL_TABLET | Freq: Once | ORAL | Status: AC
  Administered 2018-05-12: 10 mg via ORAL
  Filled 2018-05-12: qty 2

## 2018-05-12 NOTE — ED Triage Notes (Signed)
Pt to ED via POV. PT states that he was called by his PCP to come in for abnormal lab results. Pt states that he was told that his potassium and his Hgb were low. Pt states that he has been having abdominal pain, foot pain, and N/V/D since around 0400. Pt has wound vac on left foot. Pt has PICC line in right arm. Pt is in NAD at this time.

## 2018-05-12 NOTE — Discharge Instructions (Addendum)
To the emergency room for any new or worrisome symptoms including worsening vomiting.  We very strongly advised that you do not take marijuana anymore as I think it can be contributing to your chronic nausea.  In addition, if you have chest pain shortness of breath headache, fever abdominal pain or you feel worse in any way please return to the emergency room.  Her blood pressure was high here, it is been high here before, this is likely because you are not taking her blood pressure medications.  Is very important even when you do not feel that you take your blood pressure medication if possible.  Please follow-up with your care doctor tomorrow for recheck of your blood pressure.

## 2018-05-12 NOTE — ED Notes (Signed)
Attempted to call nurse to inform her pt was in room. No answer

## 2018-05-12 NOTE — ED Notes (Signed)
Pt reminded a urine is needed for a urinalysis.  Pt states he cannot go at this time.

## 2018-05-12 NOTE — ED Provider Notes (Addendum)
[facility] Emergency Department Provider Note  ____________________________________________   I have reviewed the triage vital signs and the nursing notes. Where available I have reviewed prior notes and, if possible and indicated, outside hospital notes.    HISTORY  Chief Complaint Abdominal Pain and Emesis    HPI Shawn [name] is a 32 y.o. male  With a history of chronic nausea and vomiting, recurrent, and daily marijuana abuse, he states he did use marijuana last night this morning he woke up nauseated as he always does especially after using a lot of marijuana, and he received a phone call from his primary care doctor telling him he needed to come to the emergency room anyway because his potassium was low and some blood draw they did earlier this week so he came in.  He states he has been vomiting again.  He has not vomited since arrival here.  He states he has general diffuse abdominal pain the same as a "always as".  No fever no chills.  No melena no bright red blood per rectum denies diarrhea.  Patient does have a PICC line and a wound VAC, he denies fever, he denies any change in his foot he would prefer not to have me take down the wound VAC    Past Medical History:  Diagnosis Date  . Diabetes mellitus without complication ([place])   . GERD (gastroesophageal reflux disease)   . IBS (irritable bowel syndrome)     Patient Active Problem List   Diagnosis Date Noted  . Acute renal failure (ARF) ([place]) 04/27/2018  . Left foot infection 04/05/2018  . Foot infection 02/06/2018  . Osteomyelitis ([place]) 01/10/2018  . Cellulitis 12/29/2017  . Sepsis ([place]) 12/18/2017  . Moderate recurrent major depression ([place]) 11/10/2017  . Type 2 diabetes mellitus with hyperosmolar nonketotic hyperglycemia ([place]) 11/09/2017  . History of migraine 07/15/2017  . IBS (irritable bowel syndrome) 07/15/2017  . Protein-calorie malnutrition, severe 07/14/2017  . Abdominal pain  07/13/2017  . GERD (gastroesophageal reflux disease) 12/30/2009  . Diabetes ([place]) 11/25/2009  . MDD (major depressive disorder) 11/25/2009  . Hypercholesterolemia 03/07/2008    Past Surgical History:  Procedure Laterality Date  . ABDOMINAL AORTOGRAM W/LOWER EXTREMITY Right 12/23/2017   Procedure: ABDOMINAL AORTOGRAM W/LOWER EXTREMITY;  Surgeon: Katha Cabal, MD;  Location: [place] [facility];  Service: Cardiovascular;  Laterality: Right;  . AMPUTATION Right 12/24/2017   Procedure: AMPUTATION RAY;  Surgeon: Sharlotte Alamo, DPM;  Location: [facility];  Service: Podiatry;  Laterality: Right;  . AMPUTATION Left 04/06/2018   Procedure: AMPUTATION RAY;  Surgeon: Sharlotte Alamo, DPM;  Location: [facility];  Service: Podiatry;  Laterality: Left;  . AMPUTATION Left 04/09/2018   Procedure: AMPUTATION RAY;  Surgeon: Sharlotte Alamo, DPM;  Location: [facility];  Service: Podiatry;  Laterality: Left;  . APPLICATION OF WOUND VAC Right 12/24/2017   Procedure: APPLICATION OF WOUND VAC;  Surgeon: Sharlotte Alamo, DPM;  Location: [facility];  Service: Podiatry;  Laterality: Right;  . APPLICATION OF WOUND VAC Right 01/01/2018   Procedure: APPLICATION OF WOUND VAC;  Surgeon: Albertine Patricia, DPM;  Location: [facility];  Service: Podiatry;  Laterality: Right;  . IRRIGATION AND DEBRIDEMENT FOOT Right 12/20/2017   Procedure: IRRIGATION AND DEBRIDEMENT FOOT;  Surgeon: Sharlotte Alamo, DPM;  Location: [facility];  Service: Podiatry;  Laterality: Right;  . IRRIGATION AND DEBRIDEMENT FOOT Right 12/24/2017   Procedure: IRRIGATION AND DEBRIDEMENT FOOT;  Surgeon: Sharlotte Alamo, DPM;  Location: [facility];  Service: Podiatry;  Laterality: Right;  . IRRIGATION AND DEBRIDEMENT FOOT Right 01/01/2018   Procedure: IRRIGATION AND DEBRIDEMENT FOOT-SKIN,SOFT TISSUE AND BONE;  Surgeon: Albertine Patricia, DPM;  Location: [facility];  Service: Podiatry;  Laterality: Right;  . IRRIGATION AND DEBRIDEMENT FOOT Left 04/06/2018   Procedure: IRRIGATION AND DEBRIDEMENT  FOOT;  Surgeon: Sharlotte Alamo, DPM;  Location: [facility];  Service: Podiatry;  Laterality: Left;  . LOWER EXTREMITY ANGIOGRAPHY Left 04/06/2018   Procedure: Lower Extremity Angiography;  Surgeon: Algernon Huxley, MD;  Location: [place] [facility];  Service: Cardiovascular;  Laterality: Left;    Prior to Admission medications   Medication Sig Start Date End Date Taking? Authorizing Provider  ALPRAZolam (XANAX) 0.25 MG tablet Take 1 tablet (0.25 mg total) by mouth 3 (three) times daily as needed for anxiety. 12/27/17  Yes Wieting, Richard, MD  amLODipine (NORVASC) 10 MG tablet Take 1 tablet (10 mg total) by mouth daily. 05/03/18  Yes Wieting, Richard, MD  ampicillin-sulbactam (UNASYN) IVPB Inject 12 g into the vein daily. As continuous infusion Indication:  Diabetic foot/osteomyelitis Last Day of Therapy: 05/19/18 Labs - Once weekly:  CBC/D, CMP, CRP, ESR 04/11/18 05/20/18 Yes Sainani, Belia Heman, MD  diphenoxylate-atropine (LOMOTIL) 2.5-0.025 MG tablet Take 1 tablet by mouth 4 (four) times daily as needed for diarrhea or loose stools. 04/11/18 04/11/19 Yes Henreitta Leber, MD  ferrous sulfate 325 (65 FE) MG tablet Take 1 tablet (325 mg total) by mouth 2 (two) times daily with a meal. 01/03/18  Yes Sainani, Belia Heman, MD  FLUoxetine (PROZAC) 20 MG capsule Take 1 capsule (20 mg total) by mouth at bedtime. 05/02/18  Yes Wieting, Richard, MD  folic acid (FOLVITE) 1 MG tablet Take 1 tablet (1 mg total) by mouth daily. 05/03/18  Yes Wieting, Richard, MD  insulin glargine (LANTUS) 100 UNIT/ML injection Inject 0.2 mLs (20 Units total) into the skin at bedtime. 04/11/18  Yes Sainani, Belia Heman, MD  Insulin Syringes, Disposable, U-100 0.3 ML MISC 1 Syringe by Does not apply route 4 (four) times daily -  with meals and at bedtime. 12/27/17  Yes Wieting, Richard, MD  metoCLOPramide (REGLAN) 10 MG tablet Take 1 tablet (10 mg total) by mouth 3 (three) times daily with meals. 05/02/18 06/01/18 Yes Wieting, Richard, MD  Multiple  Vitamin (MULTIVITAMIN WITH MINERALS) TABS tablet Take 1 tablet by mouth daily. 05/03/18  Yes Wieting, Richard, MD  NOVOLIN R RELION 100 UNIT/ML injection Inject 0-0.1 mLs (0-10 Units total) into the skin 3 (three) times daily as needed for high blood sugar (sliding scale). 05/02/18 07/01/18 Yes Wieting, Richard, MD  oxyCODONE-acetaminophen (PERCOCET/ROXICET) 5-325 MG tablet Take 1 tablet by mouth every 6 (six) hours as needed. 05/02/18  Yes Wieting, Richard, MD  promethazine (PHENERGAN) 25 MG tablet Take 0.5 tablets (12.5 mg total) by mouth every 8 (eight) hours as needed for nausea or vomiting. 05/02/18  Yes Wieting, Richard, MD  vitamin B-12 (CYANOCOBALAMIN) 1000 MCG tablet Take 1 tablet (1,000 mcg total) by mouth daily. 05/02/18  Yes Wieting, Richard, MD  zolpidem (AMBIEN) 5 MG tablet Take 1 tablet (5 mg total) by mouth at bedtime as needed for sleep. 04/11/18 05/12/18 Yes Sainani, Belia Heman, MD  feeding supplement, GLUCERNA SHAKE, (GLUCERNA SHAKE) LIQD Take 237 mLs by mouth 3 (three) times daily between meals. 12/27/17   Loletha Grayer, MD    Allergies Banana; Keflex [cephalexin]; Onion; Sulfa antibiotics; Grapeseed extract [nutritional supplements]; and Shellfish allergy  Family History  Problem Relation Age of Onset  . Diabetes  Mother   . Ovarian cancer Mother     Social History Social History   Tobacco Use  . Smoking status: Never Smoker  . Smokeless tobacco: Never Used  Substance Use Topics  . Alcohol use: No    Frequency: Never  . Drug use: Yes    Types: Marijuana, PCP    Review of Systems Constitutional: No fever/chills Eyes: No visual changes. ENT: No sore throat. No stiff neck no neck pain Cardiovascular: Denies chest pain. Respiratory: Denies shortness of breath. Gastrointestinal:   + vomiting.  No diarrhea.  No constipation. Genitourinary: Negative for dysuria. Musculoskeletal: Negative lower extremity swelling Skin: Negative for rash. Neurological: Negative for  severe headaches, focal weakness or numbness.   ____________________________________________   PHYSICAL EXAM:  VITAL SIGNS: ED Triage Vitals  Enc Vitals Group     BP 05/12/18 0955 (!) 182/109     Pulse Rate 05/12/18 0955 88     Resp 05/12/18 0955 18     Temp 05/12/18 0955 98.3 F (36.8 C)     Temp Source 05/12/18 0955 Oral     SpO2 05/12/18 0955 100 %     Weight 05/12/18 0956 127 lb (57.6 kg)     Height 05/12/18 0956 _0  (1.676 m)     Head Circumference --      Peak Flow --      Pain Score 05/12/18 1011 10     Pain Loc --      Pain Edu? --      Excl. in [place]? --     Constitutional: Alert and oriented. Well appearing and in no acute distress. Eyes: Conjunctivae are normal Head: Atraumatic HEENT: No congestion/rhinnorhea. Mucous membranes are moist.  Oropharynx non-erythematous Neck:   Nontender with no meningismus, no masses, no stridor Cardiovascular: Normal rate, regular rhythm. Grossly normal heart sounds.  Good peripheral circulation. Respiratory: Normal respiratory effort.  No retractions. Lungs CTAB. Abdominal: Soft and mild diffuse tenderness, distractible. No distention. No guarding no rebound Back:  There is no focal tenderness or step off.  there is no midline tenderness there are no lesions noted. there is no CVA tenderness  Musculoskeletal: Wound VAC in place and scantly draining on the left, no upper extremity tenderness. No joint effusions, no DVT signs strong distal pulses no edema plan in place and does not appear to be infected on the right Neurologic:  Normal speech and language. No gross focal neurologic deficits are appreciated.  Skin:  Skin is warm, dry and intact. No rash noted. Psychiatric: Mood and affect are normal. Speech and behavior are normal.  ____________________________________________   LABS (all labs ordered are listed, but only abnormal results are displayed)  Labs Reviewed  COMPREHENSIVE METABOLIC PANEL - Abnormal; Notable for the  following components:      Result Value   Potassium 3.2 (*)    Glucose, Bld 195 (*)    Creatinine, Ser 1.59 (*)    Calcium 8.5 (*)    Albumin 2.8 (*)    GFR calc non Af Amer 57 (*)    All other components within normal limits  CBC - Abnormal; Notable for the following components:   RBC 4.19 (*)    Hemoglobin 10.9 (*)    HCT 34.0 (*)    All other components within normal limits  GLUCOSE, CAPILLARY - Abnormal; Notable for the following components:   Glucose-Capillary 179 (*)    All other components within normal limits  LIPASE, BLOOD  URINALYSIS, COMPLETE (UACMP) WITH MICROSCOPIC  URINE DRUG SCREEN, QUALITATIVE ([facility] ONLY)    Pertinent labs  results that were available during my care of the patient were reviewed by me and considered in my medical decision making (see chart for details). ____________________________________________  EKG  I personally interpreted any EKGs ordered by me or triage  ____________________________________________  RADIOLOGY  Pertinent labs & imaging results that were available during my care of the patient were reviewed by me and considered in my medical decision making (see chart for details). If possible, patient and/or family made aware of any abnormal findings.  No results found. ____________________________________________    PROCEDURES  Procedure(s) performed: None  Procedures  Critical Care performed: None  ____________________________________________   INITIAL IMPRESSION / ASSESSMENT AND PLAN / ED COURSE  Pertinent labs & imaging results that were available during my care of the patient were reviewed by me and considered in my medical decision making (see chart for details).  Patient with chronic recurrent likely marijuana induced nausea and vomiting has nausea and vomiting today.  He has a host of other medical problems unfortunately, but none of them seem to be particularly active at this time.  For example, patient does not  appear to be in DKA, he does not have an acute abdomen, usually, Haldol makes him feel better we will give him Haldol, IV fluids.  He is not significantly anemic by our blood work and he does not have any significant evidence of clinically relevant hypokalemia but we will give him potassium, patient usually responds well to Haldol when this flares up and we will give him that.  ----------------------------------------- 2:03 PM on 05/12/2018 ----------------------------------------- Patient is doing quite well at this time, tolerating p.o. with no difficulty, he is requesting discharge.  We will discharge him home with close outpatient follow-up and return precautions given understood.  Abdomen remains benign.  Blood pressure was initially elevated but that was when he was in the active throwing up apparently, he has not vomited since he is been here otherwise and he feels 100% better serial abdominal exams considering the patient's symptoms, medical history, and physical examination today, I have low suspicion for cholecystitis or biliary pathology, pancreatitis, perforation or bowel obstruction, hernia, intra-abdominal abscess, AAA or dissection, volvulus or intussusception, mesenteric ischemia, ischemic gut, pyelonephritis or appendicitis.  Nor is there any evidence that he has a disseminated infection either from his PICC line or from his wound VAC.  Blood pressure is somewhat elevated, he has no chest pain, has no headache, evidence of hypertensive urgency, and he did not take any of his blood pressure medications today.  He states is not unusual for his blood pressure spike when he is in a physician's office and he has not taken his meds.  We will start him back on his home blood pressure medications.  Patient sitting up laughing and watching cartoons in the room with no evidence of discomfort and no complaints at this time.  His only request is that we discharge him and he does not want to stay for  further evaluation we will restart his home medications prior to discharge.  I have encouraged compliance.    ----------------------------------------- 2:47 PM on 05/12/2018 -----------------------------------------  Ms. Joya Gaskins is here he refuses all further care demands to be discharged right now wants his IV pulled and wants to go he does not want to wait for his urinalysis results and he does not want to wait for any other intervention.  He is ready to go and does not  want more care for me.  He and I discussed the risk benefits alternative of his precipitous departure and he states that he just wants to get out of here he is not angry he just finds it inconvenient to be here now that he feels better    -----------------------------------------     ____________________________________________   FINAL CLINICAL IMPRESSION(S) / ED DIAGNOSES  Final diagnoses:  None      This chart was dictated using voice recognition software.  Despite best efforts to proofread,  errors can occur which can change meaning.     Schuyler Amor, MD 05/12/18 1229  Schuyler Amor, MD 05/12/18 3837  Schuyler Amor, MD 05/12/18 1406  Schuyler Amor, MD 05/12/18 1420  Schuyler Amor, MD 05/12/18 1421  Schuyler Amor, MD 05/12/18 [phone]

## 2018-05-12 NOTE — ED Notes (Signed)
FIRST NURSE NOTE:  Pt sent here from Dr. Devona Konig office for low potassium and low hemoglobin. Pt is type 1 diabetic. Pt placed in wheelchair on arrival.

## 2018-05-16 ENCOUNTER — Ambulatory Visit: Payer: Medicaid Other | Attending: Infectious Diseases | Admitting: Infectious Diseases

## 2018-05-16 ENCOUNTER — Encounter: Payer: Self-pay | Admitting: Infectious Diseases

## 2018-05-16 VITALS — BP 151/102 | HR 92 | Temp 97.9°F | Ht 66.0 in | Wt 127.0 lb

## 2018-05-16 DIAGNOSIS — L97529 Non-pressure chronic ulcer of other part of left foot with unspecified severity: Secondary | ICD-10-CM

## 2018-05-16 DIAGNOSIS — I96 Gangrene, not elsewhere classified: Secondary | ICD-10-CM

## 2018-05-16 DIAGNOSIS — E1152 Type 2 diabetes mellitus with diabetic peripheral angiopathy with gangrene: Secondary | ICD-10-CM

## 2018-05-16 DIAGNOSIS — N189 Chronic kidney disease, unspecified: Secondary | ICD-10-CM

## 2018-05-16 DIAGNOSIS — E1122 Type 2 diabetes mellitus with diabetic chronic kidney disease: Secondary | ICD-10-CM

## 2018-05-16 DIAGNOSIS — Z91018 Allergy to other foods: Secondary | ICD-10-CM

## 2018-05-16 DIAGNOSIS — Z95828 Presence of other vascular implants and grafts: Secondary | ICD-10-CM

## 2018-05-16 DIAGNOSIS — E11621 Type 2 diabetes mellitus with foot ulcer: Secondary | ICD-10-CM | POA: Diagnosis not present

## 2018-05-16 DIAGNOSIS — Z9889 Other specified postprocedural states: Secondary | ICD-10-CM

## 2018-05-16 DIAGNOSIS — Z881 Allergy status to other antibiotic agents status: Secondary | ICD-10-CM

## 2018-05-16 DIAGNOSIS — D649 Anemia, unspecified: Secondary | ICD-10-CM

## 2018-05-16 DIAGNOSIS — E10628 Type 1 diabetes mellitus with other skin complications: Secondary | ICD-10-CM

## 2018-05-16 DIAGNOSIS — Z882 Allergy status to sulfonamides status: Secondary | ICD-10-CM

## 2018-05-16 DIAGNOSIS — L089 Local infection of the skin and subcutaneous tissue, unspecified: Principal | ICD-10-CM

## 2018-05-16 DIAGNOSIS — E1169 Type 2 diabetes mellitus with other specified complication: Secondary | ICD-10-CM | POA: Diagnosis not present

## 2018-05-16 DIAGNOSIS — M869 Osteomyelitis, unspecified: Secondary | ICD-10-CM

## 2018-05-16 DIAGNOSIS — R110 Nausea: Secondary | ICD-10-CM

## 2018-05-16 DIAGNOSIS — Z89422 Acquired absence of other left toe(s): Secondary | ICD-10-CM

## 2018-05-16 DIAGNOSIS — Z91013 Allergy to seafood: Secondary | ICD-10-CM

## 2018-05-16 DIAGNOSIS — M86172 Other acute osteomyelitis, left ankle and foot: Secondary | ICD-10-CM

## 2018-05-16 DIAGNOSIS — Z794 Long term (current) use of insulin: Secondary | ICD-10-CM

## 2018-05-16 MED ORDER — AMOXICILLIN-POT CLAVULANATE 875-125 MG PO TABS: 1.0000 | ORAL_TABLET | Freq: Two times a day (BID) | ORAL | 1 refills | Status: DC

## 2018-05-16 NOTE — Patient Instructions (Addendum)
You are here for follow up of left foot infection- you are completing 6 weeks on 11/15- the picc can be removed and we will transition you to oral augmentin- follow up with [place] foot doctor

## 2018-05-16 NOTE — Progress Notes (Signed)
NAME: Shawn [name]  DOB: [birth year]  MRN: [id number]  Date/Time: 05/16/2018 10:06 AM Subjective:  Here for follow up Was in [facility] between  ? Shawn [name] is a 32 y.o. with a history of DM, chronic left foot infection [facility] and underwent 5th toe ray excision and then 4th toe removal for gangrenous infection and was discharged on IV unasyn until 05/19/18. He was readmitted between 10/24-10/29 for AKi, dehydration and anemia and received fluids and blood transfuison. He is here for follow up of the left foot infection Says he is okay Poor appetite as he is always nauseous No fever or chills No diarrhea Says he had to remove the wound vac because of leak- last night and is doing wet to dry dressing now Taking Iv unasyn Last date 05/19/18  Past Medical History:  Diagnosis Date  . Diabetes mellitus without complication ([place])   . GERD (gastroesophageal reflux disease)   . IBS (irritable bowel syndrome)     Past Surgical History:  Procedure Laterality Date  . ABDOMINAL AORTOGRAM W/LOWER EXTREMITY Right 12/23/2017   Procedure: ABDOMINAL AORTOGRAM W/LOWER EXTREMITY;  Surgeon: Katha Cabal, MD;  Location: [place] [facility];  Service: Cardiovascular;  Laterality: Right;  . AMPUTATION Right 12/24/2017   Procedure: AMPUTATION RAY;  Surgeon: Sharlotte Alamo, DPM;  Location: [facility];  Service: Podiatry;  Laterality: Right;  . AMPUTATION Left 04/06/2018   Procedure: AMPUTATION RAY;  Surgeon: Sharlotte Alamo, DPM;  Location: [facility];  Service: Podiatry;  Laterality: Left;  . AMPUTATION Left 04/09/2018   Procedure: AMPUTATION RAY;  Surgeon: Sharlotte Alamo, DPM;  Location: [facility];  Service: Podiatry;  Laterality: Left;  . APPLICATION OF WOUND VAC Right 12/24/2017   Procedure: APPLICATION OF WOUND VAC;  Surgeon: Sharlotte Alamo, DPM;  Location: [facility];  Service: Podiatry;  Laterality: Right;  . APPLICATION OF WOUND VAC Right 01/01/2018   Procedure: APPLICATION OF WOUND VAC;   Surgeon: Albertine Patricia, DPM;  Location: [facility];  Service: Podiatry;  Laterality: Right;  . IRRIGATION AND DEBRIDEMENT FOOT Right 12/20/2017   Procedure: IRRIGATION AND DEBRIDEMENT FOOT;  Surgeon: Sharlotte Alamo, DPM;  Location: [facility];  Service: Podiatry;  Laterality: Right;  . IRRIGATION AND DEBRIDEMENT FOOT Right 12/24/2017   Procedure: IRRIGATION AND DEBRIDEMENT FOOT;  Surgeon: Sharlotte Alamo, DPM;  Location: [facility];  Service: Podiatry;  Laterality: Right;  . IRRIGATION AND DEBRIDEMENT FOOT Right 01/01/2018   Procedure: IRRIGATION AND DEBRIDEMENT FOOT-SKIN,SOFT TISSUE AND BONE;  Surgeon: Albertine Patricia, DPM;  Location: [facility];  Service: Podiatry;  Laterality: Right;  . IRRIGATION AND DEBRIDEMENT FOOT Left 04/06/2018   Procedure: IRRIGATION AND DEBRIDEMENT FOOT;  Surgeon: Sharlotte Alamo, DPM;  Location: [facility];  Service: Podiatry;  Laterality: Left;  . LOWER EXTREMITY ANGIOGRAPHY Left 04/06/2018   Procedure: Lower Extremity Angiography;  Surgeon: Algernon Huxley, MD;  Location: [place] [facility];  Service: Cardiovascular;  Laterality: Left;    Social History   Socioeconomic History  . Marital status: Single    Spouse name: Not on file  . Number of children: Not on file  . Years of education: Not on file  . Highest education level: Not on file  Occupational History  . Not on file  Social Needs  . Financial resource strain: Not on file  . Food insecurity:    Worry: Not on file    Inability: Not on file  . Transportation needs:    Medical: Not on file    Non-medical:  Not on file  Tobacco Use  . Smoking status: Never Smoker  . Smokeless tobacco: Never Used  Substance and Sexual Activity  . Alcohol use: No    Frequency: Never  . Drug use: Yes    Types: Marijuana, PCP  . Sexual activity: Not on file  Lifestyle  . Physical activity:    Days per week: Not on file    Minutes per session: Not on file  . Stress: Not on file  Relationships  . Social connections:    Talks on  phone: Not on file    Gets together: Not on file    Attends religious service: Not on file    Active member of club or organization: Not on file    Attends meetings of clubs or organizations: Not on file    Relationship status: Not on file  . Intimate partner violence:    Fear of current or ex partner: Not on file    Emotionally abused: Not on file    Physically abused: Not on file    Forced sexual activity: Not on file  Other Topics Concern  . Not on file  Social History Narrative  . Not on file    Family History  Problem Relation Age of Onset  . Diabetes Mother   . Ovarian cancer Mother    Allergies  Allergen Reactions  . Banana Hives, Nausea And Vomiting and Rash  . Keflex [Cephalexin] Rash    No swelling- also taken penicillin without any issue.  . Onion Hives, Nausea And Vomiting and Rash  . Sulfa Antibiotics Anaphylaxis  . Grapeseed Extract [Nutritional Supplements] Itching  . Shellfish Allergy Hives    "ALL SEAFOOD"  current meds reviewed   REVIEW OF SYSTEMS:  Const: negative fever, negative chills, positive weight loss Eyes: negative diplopia or visual changes, negative eye pain ENT: negative coryza, negative sore throat Resp: negative cough, hemoptysis, dyspnea Cards: negative for chest pain, palpitations, lower extremity edema GU: negative for frequency, dysuria and hematuria GI: Negative for abdominal pain, diarrhea, bleeding, constipation Skin: negative for rash and pruritus Heme: negative for easy bruising and gum/nose bleeding MS:  muscle weakness, left foot wound Neurolo:negative for headaches, dizziness, vertigo, memory problems  Psych: negative for feelings of anxiety, depression  Endocrine: some polyuria Allergy/Immunology- some med allergies?  Objective:  VITALS:  BP (!) 151/102 (BP Location: Left Arm, Patient Position: Sitting, Cuff Size: Normal)   Pulse 92   Temp 97.9 F (36.6 C) (Oral)   Ht '5\' 6"'$  (1.676 m)   Wt 127 lb (57.6 kg)   BMI  20.50 kg/m  PHYSICAL EXAM:  General: Alert, cooperative, no distress, appears stated age. pale Head: Normocephalic, without obvious abnormality, atraumatic. Eyes: Conjunctivae clear, anicteric sclerae. Pupils are equal ENT Nares normal. No drainage or sinus tenderness. Lips, mucosa, and tongue normal. No Thrush Neck: Supple, symmetrical, no adenopathy, thyroid: non tender no carotid bruit and no JVD. Back: No CVA tenderness. Lungs: Clear to auscultation bilaterally. No Wheezing or Rhonchi. No rales. Heart: Regular rate and rhythm, no murmur, rub or gallop. Abdomen: Soft, non-tender,not distended. Bowel sounds normal. No masses Extremities: left foot  05/16/18 Maceration from the woundvac seepage   05/02/18    Skin: No rashes or lesions. Or bruising Lymph: Cervical, supraclavicular normal. Neurologic: Grossly non-focal Pertinent Labs Lab Results CBC    Component Value Date/Time   WBC 8.0 05/12/2018 1024   RBC 4.19 (L) 05/12/2018 1024   HGB 10.9 (L) 05/12/2018 1024   HGB  16.4 10/06/2014 0539   HCT 34.0 (L) 05/12/2018 1024   HCT 47.4 10/06/2014 0539   PLT 311 05/12/2018 1024   PLT 250 10/06/2014 0539   MCV 81.1 05/12/2018 1024   MCV 84 10/06/2014 0539   MCH 26.0 05/12/2018 1024   MCHC 32.1 05/12/2018 1024   RDW 14.3 05/12/2018 1024   RDW 12.7 10/06/2014 0539   LYMPHSABS 1.5 04/11/2018 0532   LYMPHSABS 2.0 10/06/2014 0539   MONOABS 0.7 04/11/2018 0532   MONOABS 0.8 10/06/2014 0539   EOSABS 0.4 04/11/2018 0532   EOSABS 0.0 10/06/2014 0539   BASOSABS 0.0 04/11/2018 0532   BASOSABS 0.0 10/06/2014 0539    CMP Latest Ref Rng & Units 05/12/2018 05/06/2018 05/02/2018  Glucose 70 - 99 mg/dL 195(H) 210(H) 77  BUN 6 - 20 mg/dL 14 20 28(H)  Creatinine 0.61 - 1.24 mg/dL 1.59(H) 1.59(H) 1.87(H)  Sodium 135 - 145 mmol/L 138 144 141  Potassium 3.5 - 5.1 mmol/L 3.2(L) 3.5 3.8  Chloride 98 - 111 mmol/L 99 104 107  CO2 22 - 32 mmol/L '26 29 28  '$ Calcium 8.9 - 10.3 mg/dL 8.5(L)  9.1 8.2(L)  Total Protein 6.5 - 8.1 g/dL 6.8 7.2 -  Total Bilirubin 0.3 - 1.2 mg/dL 0.6 0.5 -  Alkaline Phos 38 - 126 U/L 119 137(H) -  AST 15 - 41 U/L 22 26 -  ALT 0 - 44 U/L 19 31 -   ESR 27/ 42   Microbiology: No results found for this or any previous visit (from the past 240 hour(s)). IMAGING RESULTS: ?none Impression/Recommendation  DM, chronic left foot infection [facility] and underwent 5th toe ray excision and then 4th toe removal for gangrenous infection and was discharged on IV unasyn until 05/19/18. ? 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  DM-Insulin dependent says sugar 140-200  CKD -stable- has an appt with renal  Anemia- recent blood transfusion  Nausea/poor appetite he could have gastrparesis from DM Discuss with PCP? ___________________________________________________ Discussed with patient, follow  PRN

## 2018-05-27 ENCOUNTER — Other Ambulatory Visit: Payer: Self-pay

## 2018-05-27 ENCOUNTER — Inpatient Hospital Stay
Admission: EM | Admit: 2018-05-27 | Discharge: 2018-05-28 | DRG: 392 | Disposition: A | Discharge status: Home Health | Payer: Medicaid Other | Attending: Internal Medicine | Admitting: Internal Medicine

## 2018-05-27 DIAGNOSIS — Z91018 Allergy to other foods: Secondary | ICD-10-CM

## 2018-05-27 DIAGNOSIS — E878 Other disorders of electrolyte and fluid balance, not elsewhere classified: Secondary | ICD-10-CM | POA: Diagnosis present

## 2018-05-27 DIAGNOSIS — E871 Hypo-osmolality and hyponatremia: Secondary | ICD-10-CM | POA: Diagnosis present

## 2018-05-27 DIAGNOSIS — A059 Bacterial foodborne intoxication, unspecified: Principal | ICD-10-CM | POA: Diagnosis present

## 2018-05-27 DIAGNOSIS — R739 Hyperglycemia, unspecified: Secondary | ICD-10-CM

## 2018-05-27 DIAGNOSIS — E1040 Type 1 diabetes mellitus with diabetic neuropathy, unspecified: Secondary | ICD-10-CM | POA: Diagnosis present

## 2018-05-27 DIAGNOSIS — K589 Irritable bowel syndrome without diarrhea: Secondary | ICD-10-CM | POA: Diagnosis present

## 2018-05-27 DIAGNOSIS — E860 Dehydration: Secondary | ICD-10-CM | POA: Diagnosis present

## 2018-05-27 DIAGNOSIS — Z89422 Acquired absence of other left toe(s): Secondary | ICD-10-CM

## 2018-05-27 DIAGNOSIS — Z833 Family history of diabetes mellitus: Secondary | ICD-10-CM

## 2018-05-27 DIAGNOSIS — Z79899 Other long term (current) drug therapy: Secondary | ICD-10-CM

## 2018-05-27 DIAGNOSIS — L089 Local infection of the skin and subcutaneous tissue, unspecified: Secondary | ICD-10-CM | POA: Diagnosis present

## 2018-05-27 DIAGNOSIS — K219 Gastro-esophageal reflux disease without esophagitis: Secondary | ICD-10-CM | POA: Diagnosis present

## 2018-05-27 DIAGNOSIS — R112 Nausea with vomiting, unspecified: Secondary | ICD-10-CM

## 2018-05-27 DIAGNOSIS — Z794 Long term (current) use of insulin: Secondary | ICD-10-CM

## 2018-05-27 DIAGNOSIS — E8809 Other disorders of plasma-protein metabolism, not elsewhere classified: Secondary | ICD-10-CM | POA: Diagnosis present

## 2018-05-27 DIAGNOSIS — R1010 Upper abdominal pain, unspecified: Secondary | ICD-10-CM | POA: Diagnosis present

## 2018-05-27 DIAGNOSIS — Z91013 Allergy to seafood: Secondary | ICD-10-CM

## 2018-05-27 DIAGNOSIS — Z89421 Acquired absence of other right toe(s): Secondary | ICD-10-CM

## 2018-05-27 DIAGNOSIS — E1101 Type 2 diabetes mellitus with hyperosmolarity with coma: Secondary | ICD-10-CM | POA: Diagnosis present

## 2018-05-27 DIAGNOSIS — Z79890 Hormone replacement therapy (postmenopausal): Secondary | ICD-10-CM

## 2018-05-27 DIAGNOSIS — Z882 Allergy status to sulfonamides status: Secondary | ICD-10-CM

## 2018-05-27 DIAGNOSIS — E861 Hypovolemia: Secondary | ICD-10-CM | POA: Diagnosis present

## 2018-05-27 DIAGNOSIS — E1065 Type 1 diabetes mellitus with hyperglycemia: Secondary | ICD-10-CM | POA: Diagnosis present

## 2018-05-27 LAB — URINALYSIS, COMPLETE (UACMP) WITH MICROSCOPIC
Bacteria, UA: NONE SEEN
Bilirubin Urine: NEGATIVE
Glucose, UA: >=500 mg/dL — AB
KETONES UR: NEGATIVE mg/dL
LEUKOCYTES UA: NEGATIVE
NITRITE: NEGATIVE
PH: 5 (ref 5.0–8.0)
Protein, ur: >=300 mg/dL — AB
Specific Gravity, Urine: 1.02 (ref 1.005–1.030)
Squamous Epithelial / LPF: NONE SEEN (ref 0–5)

## 2018-05-27 LAB — CBC
HCT: 39.5 % (ref 39.0–52.0)
Hemoglobin: 12.8 g/dL — ABNORMAL LOW (ref 13.0–17.0)
MCH: 26 pg (ref 26.0–34.0)
MCHC: 32.4 g/dL (ref 30.0–36.0)
MCV: 80.1 fL (ref 80.0–100.0)
PLATELETS: 383 10*3/uL (ref 150–400)
RBC: 4.93 MIL/uL (ref 4.22–5.81)
RDW: 14.6 % (ref 11.5–15.5)
WBC: 13.9 10*3/uL — ABNORMAL HIGH (ref 4.0–10.5)
nRBC: 0 % (ref 0.0–0.2)

## 2018-05-27 LAB — COMPREHENSIVE METABOLIC PANEL
ALT: 23 U/L (ref 0–44)
AST: 22 U/L (ref 15–41)
Albumin: 3.1 g/dL — ABNORMAL LOW (ref 3.5–5.0)
Alkaline Phosphatase: 199 U/L — ABNORMAL HIGH (ref 38–126)
Anion gap: 14 (ref 5–15)
BUN: 33 mg/dL — ABNORMAL HIGH (ref 6–20)
CHLORIDE: 95 mmol/L — AB (ref 98–111)
CO2: 23 mmol/L (ref 22–32)
Calcium: 9 mg/dL (ref 8.9–10.3)
Creatinine, Ser: 1.87 mg/dL — ABNORMAL HIGH (ref 0.61–1.24)
GFR calc Af Amer: 54 mL/min — ABNORMAL LOW (ref >60)
GFR, EST NON AFRICAN AMERICAN: 47 mL/min — AB (ref >60)
Glucose, Bld: 667 mg/dL (ref 70–99)
Potassium: 4 mmol/L (ref 3.5–5.1)
Sodium: 132 mmol/L — ABNORMAL LOW (ref 135–145)
Total Bilirubin: 0.5 mg/dL (ref 0.3–1.2)
Total Protein: 7.4 g/dL (ref 6.5–8.1)

## 2018-05-27 LAB — GLUCOSE, CAPILLARY
Glucose-Capillary: 596 mg/dL (ref 70–99)
Glucose-Capillary: 597 mg/dL (ref 70–99)

## 2018-05-27 LAB — LIPASE, BLOOD: Lipase: 32 U/L (ref 11–51)

## 2018-05-27 MED ORDER — ADENOSINE 12 MG/4ML IV SOLN
INTRAVENOUS | Status: AC
  Filled 2018-05-27: qty 4

## 2018-05-27 MED ORDER — ONDANSETRON HCL 4 MG/2ML IJ SOLN
4.0000 mg | Freq: Once | INTRAMUSCULAR | Status: AC
  Administered 2018-05-27: 4 mg via INTRAVENOUS
  Filled 2018-05-27: qty 2

## 2018-05-27 MED ORDER — ADENOSINE 6 MG/2ML IV SOLN
INTRAVENOUS | Status: AC
  Filled 2018-05-27: qty 2

## 2018-05-27 MED ORDER — INSULIN ASPART 100 UNIT/ML ~~LOC~~ SOLN
10.0000 [IU] | Freq: Once | SUBCUTANEOUS | Status: AC
  Administered 2018-05-27: 10 [IU] via SUBCUTANEOUS
  Filled 2018-05-27: qty 1

## 2018-05-27 MED ORDER — SODIUM CHLORIDE 0.9 % IV SOLN
1000.0000 mL | Freq: Once | INTRAVENOUS | Status: AC
  Administered 2018-05-27: 1000 mL via INTRAVENOUS

## 2018-05-27 MED ORDER — MORPHINE SULFATE (PF) 4 MG/ML IV SOLN
4.0000 mg | Freq: Once | INTRAVENOUS | Status: AC
  Administered 2018-05-27: 4 mg via INTRAVENOUS
  Filled 2018-05-27: qty 1

## 2018-05-27 MED ORDER — SODIUM CHLORIDE 0.9 % IV SOLN
Freq: Once | INTRAVENOUS | Status: AC
  Administered 2018-05-27: 21:00:00 via INTRAVENOUS

## 2018-05-27 NOTE — ED Notes (Signed)
Pt medicated after small amt of emesis.

## 2018-05-27 NOTE — ED Provider Notes (Addendum)
[facility] Emergency Department Provider Note   ____________________________________________    I have reviewed the triage vital signs and the nursing notes.   HISTORY  Chief Complaint Hyperglycemia; Foot Pain; Abdominal Pain; and Emesis     HPI Shawn [name] is a 32 y.o. male with a history of diabetes who presents today with upper abdominal pain nausea and vomiting.  Review of medical records demonstrates multiple visits for some sort of cyclical vomiting, patient is also been in DKA in the past.  His glucose is elevated today.  He reports he took 5 units of insulin prior to arrival.  He complains of mild burning sensation in his upper abdomen with repeated episodes of nausea and vomiting.  Denies fevers.  Denies drug use   Past Medical History:  Diagnosis Date  . Diabetes mellitus without complication ([place])   . GERD (gastroesophageal reflux disease)   . IBS (irritable bowel syndrome)     Patient Active Problem List   Diagnosis Date Noted  . Acute renal failure (ARF) ([place]) 04/27/2018  . Left foot infection 04/05/2018  . Foot infection 02/06/2018  . Osteomyelitis ([place]) 01/10/2018  . Cellulitis 12/29/2017  . Sepsis ([place]) 12/18/2017  . Moderate recurrent major depression ([place]) 11/10/2017  . Type 2 diabetes mellitus with hyperosmolar nonketotic hyperglycemia ([place]) 11/09/2017  . History of migraine 07/15/2017  . IBS (irritable bowel syndrome) 07/15/2017  . Protein-calorie malnutrition, severe 07/14/2017  . Abdominal pain 07/13/2017  . GERD (gastroesophageal reflux disease) 12/30/2009  . Diabetes ([place]) 11/25/2009  . MDD (major depressive disorder) 11/25/2009  . Hypercholesterolemia 03/07/2008    Past Surgical History:  Procedure Laterality Date  . ABDOMINAL AORTOGRAM W/LOWER EXTREMITY Right 12/23/2017   Procedure: ABDOMINAL AORTOGRAM W/LOWER EXTREMITY;  Surgeon: Katha Cabal, MD;  Location: [place] [facility];  Service:  Cardiovascular;  Laterality: Right;  . AMPUTATION Right 12/24/2017   Procedure: AMPUTATION RAY;  Surgeon: Sharlotte Alamo, DPM;  Location: [facility];  Service: Podiatry;  Laterality: Right;  . AMPUTATION Left 04/06/2018   Procedure: AMPUTATION RAY;  Surgeon: Sharlotte Alamo, DPM;  Location: [facility];  Service: Podiatry;  Laterality: Left;  . AMPUTATION Left 04/09/2018   Procedure: AMPUTATION RAY;  Surgeon: Sharlotte Alamo, DPM;  Location: [facility];  Service: Podiatry;  Laterality: Left;  . APPLICATION OF WOUND VAC Right 12/24/2017   Procedure: APPLICATION OF WOUND VAC;  Surgeon: Sharlotte Alamo, DPM;  Location: [facility];  Service: Podiatry;  Laterality: Right;  . APPLICATION OF WOUND VAC Right 01/01/2018   Procedure: APPLICATION OF WOUND VAC;  Surgeon: Albertine Patricia, DPM;  Location: [facility];  Service: Podiatry;  Laterality: Right;  . IRRIGATION AND DEBRIDEMENT FOOT Right 12/20/2017   Procedure: IRRIGATION AND DEBRIDEMENT FOOT;  Surgeon: Sharlotte Alamo, DPM;  Location: [facility];  Service: Podiatry;  Laterality: Right;  . IRRIGATION AND DEBRIDEMENT FOOT Right 12/24/2017   Procedure: IRRIGATION AND DEBRIDEMENT FOOT;  Surgeon: Sharlotte Alamo, DPM;  Location: [facility];  Service: Podiatry;  Laterality: Right;  . IRRIGATION AND DEBRIDEMENT FOOT Right 01/01/2018   Procedure: IRRIGATION AND DEBRIDEMENT FOOT-SKIN,SOFT TISSUE AND BONE;  Surgeon: Albertine Patricia, DPM;  Location: [facility];  Service: Podiatry;  Laterality: Right;  . IRRIGATION AND DEBRIDEMENT FOOT Left 04/06/2018   Procedure: IRRIGATION AND DEBRIDEMENT FOOT;  Surgeon: Sharlotte Alamo, DPM;  Location: [facility];  Service: Podiatry;  Laterality: Left;  . LOWER EXTREMITY ANGIOGRAPHY Left 04/06/2018   Procedure: Lower Extremity Angiography;  Surgeon: Algernon Huxley, MD;  Location: [facility];  Service: Cardiovascular;  Laterality: Left;    Prior to Admission medications   Medication Sig Start Date End Date Taking? Authorizing Provider  ALPRAZolam (XANAX) 0.25 MG  tablet Take 1 tablet (0.25 mg total) by mouth 3 (three) times daily as needed for anxiety. 12/27/17  Yes Wieting, Richard, MD  amLODipine (NORVASC) 10 MG tablet Take 1 tablet (10 mg total) by mouth daily. 05/03/18  Yes Loletha Grayer, MD  amoxicillin-clavulanate (AUGMENTIN) 875-125 MG tablet Take 1 tablet by mouth 2 (two) times daily. 05/16/18  Yes Tsosie Billing, MD  diphenoxylate-atropine (LOMOTIL) 2.5-0.025 MG tablet Take 1 tablet by mouth 4 (four) times daily as needed for diarrhea or loose stools. 04/11/18 04/11/19 Yes Sainani, Belia Heman, MD  feeding supplement, GLUCERNA SHAKE, (GLUCERNA SHAKE) LIQD Take 237 mLs by mouth 3 (three) times daily between meals. 12/27/17  Yes Loletha Grayer, MD  ferrous sulfate 325 (65 FE) MG tablet Take 1 tablet (325 mg total) by mouth 2 (two) times daily with a meal. 01/03/18  Yes Sainani, Belia Heman, MD  FLUoxetine (PROZAC) 20 MG capsule Take 1 capsule (20 mg total) by mouth at bedtime. 05/02/18  Yes Wieting, Richard, MD  folic acid (FOLVITE) 1 MG tablet Take 1 tablet (1 mg total) by mouth daily. 05/03/18  Yes Wieting, Richard, MD  insulin glargine (LANTUS) 100 UNIT/ML injection Inject 0.2 mLs (20 Units total) into the skin at bedtime. 04/11/18  Yes Sainani, Belia Heman, MD  Insulin Syringes, Disposable, U-100 0.3 ML MISC 1 Syringe by Does not apply route 4 (four) times daily -  with meals and at bedtime. 12/27/17  Yes Wieting, Richard, MD  NOVOLIN R RELION 100 UNIT/ML injection Inject 0-0.1 mLs (0-10 Units total) into the skin 3 (three) times daily as needed for high blood sugar (sliding scale). 05/02/18 07/01/18 Yes Wieting, Richard, MD  oxyCODONE-acetaminophen (PERCOCET/ROXICET) 5-325 MG tablet Take 1 tablet by mouth every 6 (six) hours as needed. 05/02/18  Yes Wieting, Richard, MD  promethazine (PHENERGAN) 25 MG tablet Take 0.5 tablets (12.5 mg total) by mouth every 8 (eight) hours as needed for nausea or vomiting. 05/02/18  Yes Loletha Grayer, MD      Allergies Banana; Keflex [cephalexin]; Onion; Sulfa antibiotics; Grapeseed extract [nutritional supplements]; and Shellfish allergy  Family History  Problem Relation Age of Onset  . Diabetes Mother   . Ovarian cancer Mother     Social History Social History   Tobacco Use  . Smoking status: Never Smoker  . Smokeless tobacco: Never Used  Substance Use Topics  . Alcohol use: No    Frequency: Never  . Drug use: Yes    Types: Marijuana, PCP    Review of Systems  Constitutional: No fever/chills Eyes: No visual changes.  ENT: No sore throat. Cardiovascular: Denies chest pain. Respiratory: Denies shortness of breath. Gastrointestinal: As above Genitourinary: Negative for dysuria. Musculoskeletal: Chronic foot pain Skin: Negative for rash. Neurological: Negative for headaches or weakness   ____________________________________________   PHYSICAL EXAM:  VITAL SIGNS: ED Triage Vitals  Enc Vitals Group     BP 05/27/18 1805 (!) 145/106     Pulse Rate 05/27/18 1805 (!) 156     Resp 05/27/18 1805 18     Temp 05/27/18 1805 98.2 F (36.8 C)     Temp Source 05/27/18 1805 Oral     SpO2 05/27/18 1805 99 %     Weight 05/27/18 1805 76.2 kg (168 lb)     Height 05/27/18 1805 1.676 m (5'  6")     Head Circumference --      Peak Flow --      Pain Score 05/27/18 1811 10     Pain Loc --      Pain Edu? --      Excl. in [place]? --     Constitutional: Alert and oriented.  Eyes: Conjunctivae are normal.   Nose: No congestion/rhinnorhea. Mouth/Throat: Mucous membranes are moist.   Neck:  Painless ROM Cardiovascular: Tachycardia, regular rhythm. Kermit Balo peripheral circulation. Respiratory: Normal respiratory effort.  No retractions. Lungs CTAB. Gastrointestinal: Soft and nontender. No distention.   Musculoskeletal:Warm and well perfused Neurologic:  Normal speech and language. No gross focal neurologic deficits are appreciated.  Skin:  Skin is warm, dry and intact. No rash  noted. Psychiatric: Mood and affect are normal. Speech and behavior are normal.  ____________________________________________   LABS (all labs ordered are listed, but only abnormal results are displayed)  Labs Reviewed  CBC - Abnormal; Notable for the following components:      Result Value   WBC 13.9 (*)    Hemoglobin 12.8 (*)    All other components within normal limits  COMPREHENSIVE METABOLIC PANEL - Abnormal; Notable for the following components:   Sodium 132 (*)    Chloride 95 (*)    Glucose, Bld 667 (*)    BUN 33 (*)    Creatinine, Ser 1.87 (*)    Albumin 3.1 (*)    Alkaline Phosphatase 199 (*)    GFR calc non Af Amer 47 (*)    GFR calc Af Amer 54 (*)    All other components within normal limits  URINALYSIS, COMPLETE (UACMP) WITH MICROSCOPIC - Abnormal; Notable for the following components:   Color, Urine STRAW (*)    APPearance CLEAR (*)    Glucose, UA >=500 (*)    Hgb urine dipstick SMALL (*)    Protein, ur >=300 (*)    All other components within normal limits  GLUCOSE, CAPILLARY - Abnormal; Notable for the following components:   Glucose-Capillary 596 (*)    All other components within normal limits  LIPASE, BLOOD  BLOOD GAS, VENOUS  CBG MONITORING, ED   ____________________________________________  EKG  ED ECG REPORT I, Lavonia Drafts, the attending physician, personally viewed and interpreted this ECG.  Date: 05/27/2018  Rate: 156 Rhythm: Sinus tachycardia QRS Axis: normal Intervals: normal ST/T Wave abnormalities: normal Narrative Interpretation: no evidence of acute ischemia  ____________________________________________  RADIOLOGY  None ____________________________________________   PROCEDURES  Procedure(s) performed: No  Procedures   Critical Care performed: yes  CRITICAL CARE Performed by: Lavonia Drafts   Total critical care time: 30 minutes  Critical care time was exclusive of separately billable procedures and treating  other patients.  Critical care was necessary to treat or prevent imminent or life-threatening deterioration.  Critical care was time spent personally by me on the following activities: development of treatment plan with patient and/or surrogate as well as nursing, discussions with consultants, evaluation of patient's response to treatment, examination of patient, obtaining history from patient or surrogate, ordering and performing treatments and interventions, ordering and review of laboratory studies, ordering and review of radiographic studies, pulse oximetry and re-evaluation of patient's condition.  ____________________________________________   INITIAL IMPRESSION / ASSESSMENT AND PLAN / ED COURSE  Pertinent labs & imaging results that were available during my care of the patient were reviewed by me and considered in my medical decision making (see chart for details).  Patient presents  with nausea vomiting upper abdominal pain.  Glucose is 667 however anion gap is normal.  pH is 7.32.  Lipase normal.  This appears to be a recurrence of his cyclical vomiting although he denies marijuana usage this week.  We will treat with IV fluids, IV Zofran, IV morphine  Patient continues to have nausea and vomiting and tachycardia after multiple rounds of IV Zofran.  Will admit to the hospitalist service    ____________________________________________   FINAL CLINICAL IMPRESSION(S) / ED DIAGNOSES  Final diagnoses:  Hyperglycemia  Intractable nausea and vomiting        Note:  This document was prepared using Dragon voice recognition software and may include unintentional dictation errors.    Lavonia Drafts, MD 05/27/18 2219    Lavonia Drafts, MD 06/17/18 [phone]

## 2018-05-27 NOTE — ED Notes (Signed)
Pt sleeping soundly - no emesis since given zofran.

## 2018-05-27 NOTE — Code Documentation (Signed)
Pt vomiting times 1 in room

## 2018-05-27 NOTE — ED Triage Notes (Signed)
Pt c/o abdominal pain, emesis, foot pain and hyperglycemia X 2 days. Pt actively vomiting in triage.

## 2018-05-27 NOTE — ED Notes (Signed)
Pt awoke to talk with sister on the phone. Complains of pain and nausea. Dr made aware.

## 2018-05-27 NOTE — ED Notes (Signed)
Pt sleeping soundly - no emesis

## 2018-05-27 NOTE — ED Notes (Signed)
Pt awoke and gave urine sample

## 2018-05-28 ENCOUNTER — Other Ambulatory Visit: Payer: Self-pay

## 2018-05-28 ENCOUNTER — Encounter: Payer: Self-pay | Admitting: Internal Medicine

## 2018-05-28 ENCOUNTER — Emergency Department: Payer: Medicaid Other

## 2018-05-28 DIAGNOSIS — E860 Dehydration: Secondary | ICD-10-CM | POA: Diagnosis present

## 2018-05-28 DIAGNOSIS — E878 Other disorders of electrolyte and fluid balance, not elsewhere classified: Secondary | ICD-10-CM | POA: Diagnosis present

## 2018-05-28 DIAGNOSIS — Z79899 Other long term (current) drug therapy: Secondary | ICD-10-CM | POA: Diagnosis not present

## 2018-05-28 DIAGNOSIS — Z89421 Acquired absence of other right toe(s): Secondary | ICD-10-CM | POA: Diagnosis not present

## 2018-05-28 DIAGNOSIS — R1010 Upper abdominal pain, unspecified: Secondary | ICD-10-CM | POA: Diagnosis present

## 2018-05-28 DIAGNOSIS — Z833 Family history of diabetes mellitus: Secondary | ICD-10-CM | POA: Diagnosis not present

## 2018-05-28 DIAGNOSIS — E1040 Type 1 diabetes mellitus with diabetic neuropathy, unspecified: Secondary | ICD-10-CM | POA: Diagnosis present

## 2018-05-28 DIAGNOSIS — Z882 Allergy status to sulfonamides status: Secondary | ICD-10-CM | POA: Diagnosis not present

## 2018-05-28 DIAGNOSIS — A059 Bacterial foodborne intoxication, unspecified: Secondary | ICD-10-CM | POA: Diagnosis not present

## 2018-05-28 DIAGNOSIS — Z91013 Allergy to seafood: Secondary | ICD-10-CM | POA: Diagnosis not present

## 2018-05-28 DIAGNOSIS — E8809 Other disorders of plasma-protein metabolism, not elsewhere classified: Secondary | ICD-10-CM | POA: Diagnosis present

## 2018-05-28 DIAGNOSIS — L089 Local infection of the skin and subcutaneous tissue, unspecified: Secondary | ICD-10-CM | POA: Diagnosis present

## 2018-05-28 DIAGNOSIS — E1101 Type 2 diabetes mellitus with hyperosmolarity with coma: Secondary | ICD-10-CM | POA: Diagnosis present

## 2018-05-28 DIAGNOSIS — Z79890 Hormone replacement therapy (postmenopausal): Secondary | ICD-10-CM | POA: Diagnosis not present

## 2018-05-28 DIAGNOSIS — E871 Hypo-osmolality and hyponatremia: Secondary | ICD-10-CM | POA: Diagnosis present

## 2018-05-28 DIAGNOSIS — K589 Irritable bowel syndrome without diarrhea: Secondary | ICD-10-CM | POA: Diagnosis present

## 2018-05-28 DIAGNOSIS — E861 Hypovolemia: Secondary | ICD-10-CM | POA: Diagnosis present

## 2018-05-28 DIAGNOSIS — R739 Hyperglycemia, unspecified: Secondary | ICD-10-CM | POA: Diagnosis not present

## 2018-05-28 DIAGNOSIS — Z794 Long term (current) use of insulin: Secondary | ICD-10-CM | POA: Diagnosis not present

## 2018-05-28 DIAGNOSIS — E1065 Type 1 diabetes mellitus with hyperglycemia: Secondary | ICD-10-CM | POA: Diagnosis present

## 2018-05-28 DIAGNOSIS — Z91018 Allergy to other foods: Secondary | ICD-10-CM | POA: Diagnosis not present

## 2018-05-28 DIAGNOSIS — Z89422 Acquired absence of other left toe(s): Secondary | ICD-10-CM | POA: Diagnosis not present

## 2018-05-28 DIAGNOSIS — K219 Gastro-esophageal reflux disease without esophagitis: Secondary | ICD-10-CM | POA: Diagnosis present

## 2018-05-28 LAB — GLUCOSE, CAPILLARY
GLUCOSE-CAPILLARY: 331 mg/dL — AB (ref 70–99)
Glucose-Capillary: 476 mg/dL — ABNORMAL HIGH (ref 70–99)
Glucose-Capillary: 425 mg/dL — ABNORMAL HIGH (ref 70–99)
Glucose-Capillary: 238 mg/dL — ABNORMAL HIGH (ref 70–99)
Glucose-Capillary: 199 mg/dL — ABNORMAL HIGH (ref 70–99)

## 2018-05-28 MED ORDER — INSULIN GLARGINE 100 UNIT/ML ~~LOC~~ SOLN
20.0000 [IU] | Freq: Every day | SUBCUTANEOUS | Status: DC
  Filled 2018-05-28: qty 0.2

## 2018-05-28 MED ORDER — ONDANSETRON HCL 4 MG/2ML IJ SOLN
4.0000 mg | Freq: Once | INTRAMUSCULAR | Status: AC
  Administered 2018-05-28: 4 mg via INTRAVENOUS

## 2018-05-28 MED ORDER — PREGABALIN 75 MG PO CAPS
75.0000 mg | ORAL_CAPSULE | Freq: Two times a day (BID) | ORAL | Status: DC
  Administered 2018-05-28: 75 mg via ORAL
  Filled 2018-05-28: qty 1

## 2018-05-28 MED ORDER — INSULIN ASPART 100 UNIT/ML ~~LOC~~ SOLN
0.0000 [IU] | Freq: Three times a day (TID) | SUBCUTANEOUS | Status: DC
  Administered 2018-05-28: 7 [IU] via SUBCUTANEOUS
  Administered 2018-05-28: 4 [IU] via SUBCUTANEOUS
  Filled 2018-05-28 (×2): qty 1

## 2018-05-28 MED ORDER — AMOXICILLIN-POT CLAVULANATE 875-125 MG PO TABS
1.0000 | ORAL_TABLET | Freq: Two times a day (BID) | ORAL | Status: DC
  Administered 2018-05-28: 1 via ORAL
  Filled 2018-05-28: qty 1

## 2018-05-28 MED ORDER — SENNOSIDES-DOCUSATE SODIUM 8.6-50 MG PO TABS: 1.0000 | ORAL_TABLET | Freq: Every evening | ORAL | Status: DC | PRN

## 2018-05-28 MED ORDER — FLUOXETINE HCL 20 MG PO CAPS
20.0000 mg | ORAL_CAPSULE | Freq: Every day | ORAL | Status: DC
  Filled 2018-05-28: qty 1

## 2018-05-28 MED ORDER — METOCLOPRAMIDE HCL 5 MG/ML IJ SOLN
10.0000 mg | Freq: Four times a day (QID) | INTRAMUSCULAR | Status: DC
  Administered 2018-05-28 (×2): 10 mg via INTRAVENOUS
  Filled 2018-05-28 (×2): qty 2

## 2018-05-28 MED ORDER — ACETAMINOPHEN 650 MG RE SUPP: 650.0000 mg | Freq: Four times a day (QID) | RECTAL | Status: DC | PRN

## 2018-05-28 MED ORDER — ACETAMINOPHEN 325 MG PO TABS: 650.0000 mg | ORAL_TABLET | Freq: Four times a day (QID) | ORAL | Status: DC | PRN

## 2018-05-28 MED ORDER — ONDANSETRON HCL 4 MG PO TABS: 4.0000 mg | ORAL_TABLET | Freq: Four times a day (QID) | ORAL | Status: DC | PRN

## 2018-05-28 MED ORDER — SODIUM CHLORIDE 0.9 % IV BOLUS
1000.0000 mL | Freq: Once | INTRAVENOUS | Status: AC
  Administered 2018-05-28: 1000 mL via INTRAVENOUS

## 2018-05-28 MED ORDER — ONDANSETRON HCL 4 MG/2ML IJ SOLN
INTRAMUSCULAR | Status: AC
  Filled 2018-05-28: qty 2

## 2018-05-28 MED ORDER — MORPHINE SULFATE (PF) 4 MG/ML IV SOLN
4.0000 mg | Freq: Once | INTRAVENOUS | Status: AC
  Administered 2018-05-28: 4 mg via INTRAVENOUS

## 2018-05-28 MED ORDER — ONDANSETRON HCL 4 MG/2ML IJ SOLN
4.0000 mg | Freq: Four times a day (QID) | INTRAMUSCULAR | Status: DC | PRN
  Administered 2018-05-28: 4 mg via INTRAVENOUS
  Filled 2018-05-28: qty 2

## 2018-05-28 MED ORDER — ENOXAPARIN SODIUM 40 MG/0.4ML ~~LOC~~ SOLN
40.0000 mg | SUBCUTANEOUS | Status: DC
  Administered 2018-05-28: 40 mg via SUBCUTANEOUS
  Filled 2018-05-28: qty 0.4

## 2018-05-28 MED ORDER — FERROUS SULFATE 325 (65 FE) MG PO TABS
325.0000 mg | ORAL_TABLET | Freq: Two times a day (BID) | ORAL | Status: DC
  Filled 2018-05-28: qty 1

## 2018-05-28 MED ORDER — FOLIC ACID 1 MG PO TABS
1.0000 mg | ORAL_TABLET | Freq: Every day | ORAL | Status: DC
  Administered 2018-05-28: 1 mg via ORAL
  Filled 2018-05-28: qty 1

## 2018-05-28 MED ORDER — SODIUM CHLORIDE 0.9 % IV SOLN
INTRAVENOUS | Status: DC
  Administered 2018-05-28: 05:00:00 via INTRAVENOUS

## 2018-05-28 MED ORDER — AMLODIPINE BESYLATE 5 MG PO TABS
10.0000 mg | ORAL_TABLET | Freq: Every day | ORAL | Status: DC
  Administered 2018-05-28: 10 mg via ORAL
  Filled 2018-05-28: qty 2

## 2018-05-28 MED ORDER — MORPHINE SULFATE (PF) 4 MG/ML IV SOLN
4.0000 mg | INTRAVENOUS | Status: DC | PRN
  Administered 2018-05-28 (×2): 4 mg via INTRAVENOUS
  Filled 2018-05-28 (×2): qty 1

## 2018-05-28 MED ORDER — BISACODYL 5 MG PO TBEC
5.0000 mg | DELAYED_RELEASE_TABLET | Freq: Every day | ORAL | Status: DC | PRN
  Filled 2018-05-28: qty 1

## 2018-05-28 MED ORDER — INSULIN GLARGINE 100 UNIT/ML ~~LOC~~ SOLN
24.0000 [IU] | Freq: Every day | SUBCUTANEOUS | Status: DC
  Filled 2018-05-28: qty 0.24

## 2018-05-28 MED ORDER — INSULIN ASPART 100 UNIT/ML ~~LOC~~ SOLN
10.0000 [IU] | Freq: Once | SUBCUTANEOUS | Status: AC
  Administered 2018-05-28: 10 [IU] via SUBCUTANEOUS
  Filled 2018-05-28: qty 1

## 2018-05-28 MED ORDER — ALPRAZOLAM 0.5 MG PO TABS: 0.2500 mg | ORAL_TABLET | Freq: Three times a day (TID) | ORAL | Status: DC | PRN

## 2018-05-28 MED ORDER — GLUCERNA SHAKE PO LIQD
237.0000 mL | Freq: Three times a day (TID) | ORAL | Status: DC
  Administered 2018-05-28: 237 mL via ORAL

## 2018-05-28 MED ORDER — OXYCODONE-ACETAMINOPHEN 5-325 MG PO TABS
1.0000 | ORAL_TABLET | Freq: Four times a day (QID) | ORAL | Status: DC | PRN
  Administered 2018-05-28: 2 via ORAL
  Filled 2018-05-28: qty 2

## 2018-05-28 MED ORDER — MORPHINE SULFATE (PF) 4 MG/ML IV SOLN
INTRAVENOUS | Status: AC
  Filled 2018-05-28: qty 1

## 2018-05-28 MED ORDER — INSULIN ASPART 100 UNIT/ML ~~LOC~~ SOLN: 0.0000 [IU] | Freq: Every day | SUBCUTANEOUS | Status: DC

## 2018-05-28 NOTE — Discharge Summary (Signed)
[facility] [facility] [place] NAME: Shawn [name]    MR#:  [id number]  DATE OF BIRTH:  25-May-1986  DATE OF ADMISSION:  05/27/2018 ADMITTING PHYSICIAN: Arta Silence, MD  DATE OF DISCHARGE: 05/28/2018  PRIMARY CARE PHYSICIAN: Valera Castle, MD    ADMISSION DIAGNOSIS:  Hyperglycemia [R73.9] Intractable nausea and vomiting [R11.2]  DISCHARGE DIAGNOSIS:  uncontrolled sugars secondary to nausea vomiting-- now better suspected food poisoning   SECONDARY DIAGNOSIS:   Past Medical History:  Diagnosis Date  . Diabetes mellitus without complication ([place])   . GERD (gastroesophageal reflux disease)   . IBS (irritable bowel syndrome)     HOSPITAL COURSE:  Shawn [name]  is a 32 y.o. male with a known history of uncontrolled T1DM (HbA1c 12.9 as of 04/05/2018) w/ neuropathy + nephropathy, Hx L foot ulcer/osteomyelitis (w/ wound vac), Hx ray amputations, HTN, HLD, PVD (s/p LLE angioplasty) presented to the emergency room with intractable nausea and vomiting.  1. intractable nausea vomiting suspected due to food poisoning. -Patient is tolerating regular diet. Sugars are much better. -Patient reports couple of his family members were vomiting also. -Resume home insulin regimen along with Lantus and sliding scale. He will adjust insulin according to his sugars. Advised him to stay on light meals for couple days.  2. left foot infection status post fifth rate excision and fourth toe removal for gangrene -just finished IV unasyn on 11/15 /2019--- follows with Dr. Tama High--- recommends oral Augmentin for two more weeks last dose will be 11 32,019 -patient will follow-up with Dr. Caryl Comes podiatry as outpatient -continue [facility]  3. hypertension continue home meds  4. Jerrye Bushy continue PPI  Will discharge patient a little later home. He is able to tolerate PO diet. CONSULTS OBTAINED:  Treatment Team:   Arta Silence, MD  DRUG ALLERGIES:   Allergies  Allergen Reactions  . Banana Hives, Nausea And Vomiting and Rash  . Keflex [Cephalexin] Rash    No swelling- also taken penicillin without any issue.  . Onion Hives, Nausea And Vomiting and Rash  . Sulfa Antibiotics Anaphylaxis  . Grapeseed Extract [Nutritional Supplements] Itching  . Shellfish Allergy Hives    "ALL SEAFOOD"    DISCHARGE MEDICATIONS:   Allergies as of 05/28/2018      Reactions   Banana Hives, Nausea And Vomiting, Rash   Keflex [cephalexin] Rash   No swelling- also taken penicillin without any issue.   Onion Hives, Nausea And Vomiting, Rash   Sulfa Antibiotics Anaphylaxis   Grapeseed Extract [nutritional Supplements] Itching   Shellfish Allergy Hives   "ALL SEAFOOD"      Medication List    TAKE these medications   ALPRAZolam 0.25 MG tablet Commonly known as:  XANAX Take 1 tablet (0.25 mg total) by mouth 3 (three) times daily as needed for anxiety.   amLODipine 10 MG tablet Commonly known as:  NORVASC Take 1 tablet (10 mg total) by mouth daily.   amoxicillin-clavulanate 875-125 MG tablet Commonly known as:  AUGMENTIN Take 1 tablet by mouth 2 (two) times daily.   diphenoxylate-atropine 2.5-0.025 MG tablet Commonly known as:  LOMOTIL Take 1 tablet by mouth 4 (four) times daily as needed for diarrhea or loose stools.   feeding supplement (GLUCERNA SHAKE) Liqd Take 237 mLs by mouth 3 (three) times daily between meals.   ferrous sulfate 325 (65 FE) MG tablet Take 1 tablet (325 mg total) by mouth 2 (two) times daily with  a meal.   FLUoxetine 20 MG capsule Commonly known as:  PROZAC Take 1 capsule (20 mg total) by mouth at bedtime.   folic acid 1 MG tablet Commonly known as:  FOLVITE Take 1 tablet (1 mg total) by mouth daily.   insulin glargine 100 UNIT/ML injection Commonly known as:  LANTUS Inject 0.2 mLs (20 Units total) into the skin at bedtime.   Insulin Syringes (Disposable) U-100  0.3 ML Misc 1 Syringe by Does not apply route 4 (four) times daily -  with meals and at bedtime.   NOVOLIN R RELION 100 units/mL injection Generic drug:  insulin regular Inject 0-0.1 mLs (0-10 Units total) into the skin 3 (three) times daily as needed for high blood sugar (sliding scale).   oxyCODONE-acetaminophen 5-325 MG tablet Commonly known as:  PERCOCET/ROXICET Take 1 tablet by mouth every 6 (six) hours as needed.   promethazine 25 MG tablet Commonly known as:  PHENERGAN Take 0.5 tablets (12.5 mg total) by mouth every 8 (eight) hours as needed for nausea or vomiting.       If you experience worsening of your admission symptoms, develop shortness of breath, life threatening emergency, suicidal or homicidal thoughts you must seek medical attention immediately by calling 911 or calling your MD immediately  if symptoms less severe.  You Must read complete instructions/literature along with all the possible adverse reactions/side effects for all the Medicines you take and that have been prescribed to you. Take any new Medicines after you have completely understood and accept all the possible adverse reactions/side effects.   Please note  You were cared for by a hospitalist during your hospital stay. If you have any questions about your discharge medications or the care you received while you were in the hospital after you are discharged, you can call the unit and asked to speak with the hospitalist on call if the hospitalist that took care of you is not available. Once you are discharged, your primary care physician will handle any further medical issues. Please note that NO REFILLS for any discharge medications will be authorized once you are discharged, as it is imperative that you return to your primary care physician (or establish a relationship with a primary care physician if you do not have one) for your aftercare needs so that they can reassess your need for medications and monitor  your lab values. Today   SUBJECTIVE   More vomiting. Eight regular breakfast carb control diet  VITAL SIGNS:  Blood pressure (!) 134/100, pulse (!) 113, temperature 98.8 F (37.1 C), temperature source Oral, resp. rate 16, height 5\' 6"  (1.676 m), weight 52.2 kg, SpO2 99 %.  I/O:    Intake/Output Summary (Last 24 hours) at 05/28/2018 1059 Last data filed at 05/28/2018 1026 Gross per 24 hour  Intake 979.74 ml  Output 1500 ml  Net -520.26 ml    PHYSICAL EXAMINATION:  GENERAL:  32 y.o.-year-old patient lying in the bed with no acute distress. Thin EYES: Pupils equal, round, reactive to light and accommodation. No scleral icterus. Extraocular muscles intact.  HEENT: Head atraumatic, normocephalic. Oropharynx and nasopharynx clear.  NECK:  Supple, no jugular venous distention. No thyroid enlargement, no tenderness.  LUNGS: Normal breath sounds bilaterally, no wheezing, rales,rhonchi or crepitation. No use of accessory muscles of respiration.  CARDIOVASCULAR: S1, S2 normal. No murmurs, rubs, or gallops.  ABDOMEN: Soft, non-tender, non-distended. Bowel sounds present. No organomegaly or mass.  EXTREMITIES: No pedal edema, cyanosis, or clubbing. Left foot chronic infection  secondary to diabetes dressing present NEUROLOGIC: Cranial nerves II through XII are intact. Muscle strength 5/5 in all extremities. Sensation intact. Gait not checked.  PSYCHIATRIC: The patient is alert and oriented x 3.  SKIN: No obvious rash, lesion, or ulcer.   DATA REVIEW:   CBC  Recent Labs  Lab 05/27/18 1820  WBC 13.9*  HGB 12.8*  HCT 39.5  PLT 383    Chemistries  Recent Labs  Lab 05/27/18 1820  NA 132*  K 4.0  CL 95*  CO2 23  GLUCOSE 667*  BUN 33*  CREATININE 1.87*  CALCIUM 9.0  AST 22  ALT 23  ALKPHOS 199*  BILITOT 0.5    Microbiology Results   No results found for this or any previous visit (from the past 240 hour(s)).  RADIOLOGY:  Dg Chest Port 1 View  Result Date:  05/28/2018 CLINICAL DATA:  Tachycardia EXAM: PORTABLE CHEST 1 VIEW COMPARISON:  07/13/2017 FINDINGS: The heart size and mediastinal contours are within normal limits. Both lungs are clear. The visualized skeletal structures are unremarkable. IMPRESSION: No active disease. Electronically Signed   By: Lucienne Capers M.D.   On: 05/28/2018 03:54     Management plans discussed with the patient, family and they are in agreement.  CODE STATUS:     Code Status Orders  (From admission, onward)         Start     Ordered   05/28/18 0438  Full code  Continuous     05/28/18 0437        Code Status History    Date Active Date Inactive Code Status Order ID Comments User Context   04/27/2018 1726 05/02/2018 1713 Full Code [id number]  Henreitta Leber, MD Inpatient   04/05/2018 1724 04/11/2018 2142 Full Code [id number]  Dustin Flock, MD Inpatient   02/06/2018 2022 02/07/2018 1512 Full Code [id number]  Demetrios Loll, MD Inpatient   01/10/2018 2259 01/11/2018 1947 Full Code [id number]  Amelia Jo, MD Inpatient   12/30/2017 0019 01/03/2018 1457 Full Code [id number]  Harrie Foreman, MD Inpatient   12/20/2017 1541 12/27/2017 2147 Full Code [id number]  Sharlotte Alamo, [facility] Inpatient   11/10/2017 0049 11/11/2017 1632 Full Code [id number]  Amelia Jo, MD Inpatient   07/13/2017 2112 07/15/2017 1730 Full Code [id number]  Jules Husbands, MD ED      TOTAL TIME TAKING CARE OF THIS PATIENT: *40* minutes.    Fritzi Mandes M.D on 05/28/2018 at 10:59 AM  Between 7am to 6pm - Pager - [phone] After 6pm go to [email or web address] - password [email or web address] [facility]  Office  [phone]  CC: Primary care physician; Valera Castle, MD

## 2018-05-28 NOTE — Discharge Instructions (Signed)
Continue your sugar checks at home and insulin as before

## 2018-05-28 NOTE — Progress Notes (Signed)
RN removed IVs.  PICC line was in the computer but was not present on assessment, so RN removed that from the computer.  Patient will leave in a private vehicle.  Patient's heart rate went up to the 140s earlier in the day.  RN notified the MD.  MD held discharge to wait and see how the patient was doing. RN notified MD when his heart rate was back in the 80s.  After an hour of having a normal heart rate, the MD resumed the plan for discharge.   Phillis Knack, RN

## 2018-05-28 NOTE — H&P (Signed)
[place] at [place] NAME: Shawn [name]    MR#:  [id number]  DATE OF BIRTH:  1985/08/30  DATE OF ADMISSION:  05/27/2018  PRIMARY CARE PHYSICIAN: Valera Castle, MD   REQUESTING/REFERRING PHYSICIAN: Paulette Blanch, MD  CHIEF COMPLAINT:   Chief Complaint  Patient presents with  . Hyperglycemia  . Foot Pain  . Abdominal Pain  . Emesis    HISTORY OF PRESENT ILLNESS:  Shawn [name]  is a 32 y.o. male with a known history of uncontrolled T1DM (HbA1c 12.9 as of 04/05/2018) w/ neuropathy + nephropathy, Hx L foot ulcer/osteomyelitis (w/ wound vac), Hx ray amputations, HTN, HLD, PVD (s/p LLE angioplasty), CKD III (baseline Cr 1.3-1.8), IBS, GERD p/w AP, intractable N/V. Pt tells me he was Dx w/ DM @ age 7. His Endocrinologist is [name], last visit 05/11/2018. Pt tells me that he has had chronic nausea for "weeks". He states that he developed epigastric AP on Friday 11/22, followed by N/V and dry-heaving. He continues to have N/V at the time of my assessment. He endorses fatigue/malaise, generalized weakness, dehydration. He states his feet are burning. He appears miserable, but is not in acute distress, and does not appear septic/toxic. Initial glucose 667, (-) AGAP. Suspect diabetic gastroparesis, HHS/HHNK (2/2 uncontrolled T1DM), dehydration/intravascular volume depletion.  PAST MEDICAL HISTORY:   Past Medical History:  Diagnosis Date  . Diabetes mellitus without complication ([place])   . GERD (gastroesophageal reflux disease)   . IBS (irritable bowel syndrome)     PAST SURGICAL HISTORY:   Past Surgical History:  Procedure Laterality Date  . ABDOMINAL AORTOGRAM W/LOWER EXTREMITY Right 12/23/2017   Procedure: ABDOMINAL AORTOGRAM W/LOWER EXTREMITY;  Surgeon: Katha Cabal, MD;  Location: [facility];  Service: Cardiovascular;  Laterality: Right;  . AMPUTATION Right 12/24/2017   Procedure: AMPUTATION RAY;  Surgeon:  Sharlotte Alamo, DPM;  Location: [facility];  Service: Podiatry;  Laterality: Right;  . AMPUTATION Left 04/06/2018   Procedure: AMPUTATION RAY;  Surgeon: Sharlotte Alamo, DPM;  Location: [facility];  Service: Podiatry;  Laterality: Left;  . AMPUTATION Left 04/09/2018   Procedure: AMPUTATION RAY;  Surgeon: Sharlotte Alamo, DPM;  Location: [facility];  Service: Podiatry;  Laterality: Left;  . APPLICATION OF WOUND VAC Right 12/24/2017   Procedure: APPLICATION OF WOUND VAC;  Surgeon: Sharlotte Alamo, DPM;  Location: [facility];  Service: Podiatry;  Laterality: Right;  . APPLICATION OF WOUND VAC Right 01/01/2018   Procedure: APPLICATION OF WOUND VAC;  Surgeon: Albertine Patricia, DPM;  Location: [facility];  Service: Podiatry;  Laterality: Right;  . IRRIGATION AND DEBRIDEMENT FOOT Right 12/20/2017   Procedure: IRRIGATION AND DEBRIDEMENT FOOT;  Surgeon: Sharlotte Alamo, DPM;  Location: [facility];  Service: Podiatry;  Laterality: Right;  . IRRIGATION AND DEBRIDEMENT FOOT Right 12/24/2017   Procedure: IRRIGATION AND DEBRIDEMENT FOOT;  Surgeon: Sharlotte Alamo, DPM;  Location: [facility];  Service: Podiatry;  Laterality: Right;  . IRRIGATION AND DEBRIDEMENT FOOT Right 01/01/2018   Procedure: IRRIGATION AND DEBRIDEMENT FOOT-SKIN,SOFT TISSUE AND BONE;  Surgeon: Albertine Patricia, DPM;  Location: [facility];  Service: Podiatry;  Laterality: Right;  . IRRIGATION AND DEBRIDEMENT FOOT Left 04/06/2018   Procedure: IRRIGATION AND DEBRIDEMENT FOOT;  Surgeon: Sharlotte Alamo, DPM;  Location: [facility];  Service: Podiatry;  Laterality: Left;  . LOWER EXTREMITY ANGIOGRAPHY Left 04/06/2018   Procedure: Lower Extremity Angiography;  Surgeon: Algernon Huxley, MD;  Location: [facility];  Service: Cardiovascular;  Laterality: Left;    SOCIAL HISTORY:   Social History   Tobacco Use  . Smoking status: Never Smoker  . Smokeless tobacco: Never Used  Substance Use Topics  . Alcohol use: No    Frequency: Never    FAMILY HISTORY:   Family History  Problem  Relation Age of Onset  . Diabetes Mother   . Ovarian cancer Mother     DRUG ALLERGIES:   Allergies  Allergen Reactions  . Banana Hives, Nausea And Vomiting and Rash  . Keflex [Cephalexin] Rash    No swelling- also taken penicillin without any issue.  . Onion Hives, Nausea And Vomiting and Rash  . Sulfa Antibiotics Anaphylaxis  . Grapeseed Extract [Nutritional Supplements] Itching  . Shellfish Allergy Hives    "ALL SEAFOOD"    REVIEW OF SYSTEMS:   Review of Systems  Constitutional: Positive for malaise/fatigue. Negative for chills, diaphoresis, fever and weight loss.  HENT: Negative for congestion, ear pain, hearing loss, nosebleeds, sinus pain, sore throat and tinnitus.   Eyes: Negative for blurred vision, double vision and photophobia.  Respiratory: Negative for cough, hemoptysis, sputum production, shortness of breath and wheezing.   Cardiovascular: Negative for chest pain, palpitations, orthopnea, claudication, leg swelling and PND.  Gastrointestinal: Positive for abdominal pain, nausea and vomiting. Negative for blood in stool, constipation, diarrhea, heartburn and melena.  Genitourinary: Negative for dysuria, frequency, hematuria and urgency.  Musculoskeletal: Negative for back pain, falls, joint pain, myalgias and neck pain.  Skin: Negative for itching and rash.  Neurological: Positive for sensory change and weakness. Negative for dizziness, tingling, tremors, speech change, focal weakness, seizures, loss of consciousness and headaches.  Psychiatric/Behavioral: Negative for memory loss. The patient does not have insomnia.    MEDICATIONS AT HOME:   Prior to Admission medications   Medication Sig Start Date End Date Taking? Authorizing Provider  ALPRAZolam (XANAX) 0.25 MG tablet Take 1 tablet (0.25 mg total) by mouth 3 (three) times daily as needed for anxiety. 12/27/17  Yes Wieting, Richard, MD  amLODipine (NORVASC) 10 MG tablet Take 1 tablet (10 mg total) by mouth daily.  05/03/18  Yes Loletha Grayer, MD  amoxicillin-clavulanate (AUGMENTIN) 875-125 MG tablet Take 1 tablet by mouth 2 (two) times daily. 05/16/18  Yes Tsosie Billing, MD  diphenoxylate-atropine (LOMOTIL) 2.5-0.025 MG tablet Take 1 tablet by mouth 4 (four) times daily as needed for diarrhea or loose stools. 04/11/18 04/11/19 Yes Sainani, Belia Heman, MD  feeding supplement, GLUCERNA SHAKE, (GLUCERNA SHAKE) LIQD Take 237 mLs by mouth 3 (three) times daily between meals. 12/27/17  Yes Loletha Grayer, MD  ferrous sulfate 325 (65 FE) MG tablet Take 1 tablet (325 mg total) by mouth 2 (two) times daily with a meal. 01/03/18  Yes Sainani, Belia Heman, MD  FLUoxetine (PROZAC) 20 MG capsule Take 1 capsule (20 mg total) by mouth at bedtime. 05/02/18  Yes Wieting, Richard, MD  folic acid (FOLVITE) 1 MG tablet Take 1 tablet (1 mg total) by mouth daily. 05/03/18  Yes Wieting, Richard, MD  insulin glargine (LANTUS) 100 UNIT/ML injection Inject 0.2 mLs (20 Units total) into the skin at bedtime. 04/11/18  Yes Sainani, Belia Heman, MD  Insulin Syringes, Disposable, U-100 0.3 ML MISC 1 Syringe by Does not apply route 4 (four) times daily -  with meals and at bedtime. 12/27/17  Yes Wieting, Richard, MD  NOVOLIN R RELION 100 UNIT/ML injection Inject 0-0.1 mLs (0-10 Units total) into the skin 3 (three) times daily as needed for high blood  sugar (sliding scale). 05/02/18 07/01/18 Yes Wieting, Richard, MD  oxyCODONE-acetaminophen (PERCOCET/ROXICET) 5-325 MG tablet Take 1 tablet by mouth every 6 (six) hours as needed. 05/02/18  Yes Wieting, Richard, MD  promethazine (PHENERGAN) 25 MG tablet Take 0.5 tablets (12.5 mg total) by mouth every 8 (eight) hours as needed for nausea or vomiting. 05/02/18  Yes Wieting, Richard, MD      VITAL SIGNS:  Blood pressure (!) 131/99, pulse (!) 125, temperature 98.2 F (36.8 C), temperature source Oral, resp. rate 16, height 5\' 6"  (1.676 m), weight 52.2 kg, SpO2 99 %.  PHYSICAL EXAMINATION:  Physical  Exam  Constitutional: He is oriented to person, place, and time. He appears well-developed and well-nourished. He is active and cooperative.  Non-toxic appearance. He has a sickly appearance. He appears ill. No distress. He is not intubated.  HENT:  Head: Atraumatic.  Mouth/Throat: Oropharynx is clear and moist. No oropharyngeal exudate.  Eyes: Conjunctivae, EOM and lids are normal. No scleral icterus.  Neck: Neck supple. No JVD present. No thyromegaly present.  Cardiovascular: Regular rhythm, S1 normal, S2 normal and normal heart sounds.  No extrasystoles are present. Tachycardia present. Exam reveals no gallop, no S3, no S4, no distant heart sounds and no friction rub.  No murmur heard. Pulmonary/Chest: Effort normal and breath sounds normal. No accessory muscle usage or stridor. No apnea, no tachypnea and no bradypnea. He is not intubated. No respiratory distress. He has no decreased breath sounds. He has no wheezes. He has no rhonchi. He has no rales.  Abdominal: Soft. He exhibits no distension. Bowel sounds are decreased. There is generalized tenderness and tenderness in the epigastric area. There is no rigidity, no rebound and no guarding.  Musculoskeletal: Normal range of motion. He exhibits no edema or tenderness.  Lymphadenopathy:    He has no cervical adenopathy.  Neurological: He is alert and oriented to person, place, and time. He is not disoriented.  Skin: Skin is warm and dry. No rash noted. He is not diaphoretic. No erythema.  Psychiatric: He has a normal mood and affect. His speech is normal and behavior is normal. Judgment and thought content normal. Cognition and memory are normal.   LABORATORY PANEL:   CBC Recent Labs  Lab 05/27/18 1820  WBC 13.9*  HGB 12.8*  HCT 39.5  PLT 383   ------------------------------------------------------------------------------------------------------------------  Chemistries  Recent Labs  Lab 05/27/18 1820  NA 132*  K 4.0  CL 95*    CO2 23  GLUCOSE 667*  BUN 33*  CREATININE 1.87*  CALCIUM 9.0  AST 22  ALT 23  ALKPHOS 199*  BILITOT 0.5   ------------------------------------------------------------------------------------------------------------------  Cardiac Enzymes No results for input(s): TROPONINI in the last 168 hours. ------------------------------------------------------------------------------------------------------------------  RADIOLOGY:  Dg Chest Port 1 View  Result Date: 05/28/2018 CLINICAL DATA:  Tachycardia EXAM: PORTABLE CHEST 1 VIEW COMPARISON:  07/13/2017 FINDINGS: The heart size and mediastinal contours are within normal limits. Both lungs are clear. The visualized skeletal structures are unremarkable. IMPRESSION: No active disease. Electronically Signed   By: Lucienne Capers M.D.   On: 05/28/2018 03:54   IMPRESSION AND PLAN:   A/P: 26M w/ PMhx uncontrolled T1DM p/w AP, intractable N/V. Suspected diabetic gastroparesis, HHS/HHNK, dehydration/intravascular volume depletion. Hypovolemic hypochloremic hyponatremia, hyperglycemia/glycosuria (w/ uncontrolled T1DM w/ neuropathy + nephropathy), Cr elevation/CKD III, transaminasemia, hypoalbuminemia, leukocytosis, normocytic anemia. -T1DM, AP, intractable N/V, suspected diabetic gastroparesis, HHS/HHNK, dehydration/intravascular volume depletion: T1DM x37yrs. Last HbA1c 12.9 (04/05/2018). p/w glucose 667, AGAP 14. HHS/HHNK, dehydrated. Insulin, IVF. GI symptoms  suspected to be 2/2 diabetic gastroparesis. Trial Reglan. Pain ctrl, antiemetics. Monitor/replete electrolytes. PO as tolerated. -Hypovolemic hypochloremic hyponatremia: 2/2 dehydration/intravascular volume depletion. IVF, monitor BMP. -Hyperglycemia, glycosuria, uncontrolled T1DM, diabetic neuropathy: Glucose 667, urine glucose > 500, HbA1c persistently high. Endorses burning of feet. Trial Lyrica. -Cr elevation, CKD III, diabetic nephropathy: Cr 1.87. Baseline appears to fluctuate, range  1.3-1.8. At baseline. IVF, monitor BMP, avoid nephrotoxins. -Transaminasemia, leukocytosis: Likely 2/2 intravascular volume depletion, hemoconcentration. IVF as above. -Hypoalbuminemia: Prealbumin. -Normocytic anemia: Likely 2/2 anemia of chronic disease. No evidence of active/acute blood loss at present time. -c/w home meds/formulary subs as tolerated. -FEN/GI: Cardiac diabetic diet (K+ WNL). -DVT PPx: Lovenox. -Code status: Full code. -Disposition: Admission, > 2 midnights.   All the records are reviewed and case discussed with ED provider. Management plans discussed with the patient, family and they are in agreement.  CODE STATUS: Full code.  TOTAL TIME TAKING CARE OF THIS PATIENT: 75 minutes.    Arta Silence M.D on 05/28/2018 at 5:33 AM  Between 7am to 6pm - Pager - [phone]  After 6pm go to [email or web address] - Proofreader  [facility]  Office  [phone]  CC: Primary care physician; Valera Castle, MD   Note: This dictation was prepared with Dragon dictation along with smaller phrase technology. Any transcriptional errors that result from this process are unintentional.

## 2018-05-28 NOTE — Care Management Note (Signed)
Case Management Note  Patient Details  Name: Shawn [name] MRN: [id number] Date of Birth: [birth year]  Subjective/Objective:  Patient to be discharged per MD order. Orders in place for home health services. Patient open to [facility] care for nursing care which provides dressing changes. Resumption referral placed [facility] who accepts resumption of care. No DME needs. No further RNCM needs. Ines Bloomer RN BSN RNCM [phone]                   Action/Plan:   Expected Discharge Date:  05/28/18               Expected Discharge Plan:  [place]  In-House Referral:     Discharge planning Services  CM Consult  Post Acute Care Choice:  Home Health Choice offered to:  Patient  DME Arranged:    DME Agency:     HH Arranged:  RN, PT [place] Agency:  [facility]  Status of Service:  Completed, signed off  If discussed at [place] of Stay Meetings, dates discussed:    Additional Comments:  Latanya Maudlin, RN 05/28/2018, 11:47 AM

## 2018-05-28 NOTE — Progress Notes (Signed)
Patient came to the unit with pain at a 10 in his head, abdomen, and foot. Patient was actively vomiting. He was given morphine and Zofran. He also has a wound vac on his right foot. Wound consult put in. Admission is complete.

## 2018-05-28 NOTE — Plan of Care (Signed)
  Problem: Education: Goal: Knowledge of General Education information will improve Description: Including pain rating scale, medication(s)/side effects and non-pharmacologic comfort measures Outcome: Progressing   Problem: Nutrition: Goal: Adequate nutrition will be maintained Outcome: Progressing   Problem: Pain Managment: Goal: General experience of comfort will improve Outcome: Progressing   Problem: Skin Integrity: Goal: Risk for impaired skin integrity will decrease Outcome: Progressing   

## 2018-06-08 LAB — BLOOD GAS, VENOUS
Acid-base deficit: 1.8 mmol/L (ref 0.0–2.0)
BICARBONATE: 24.7 mmol/L (ref 20.0–28.0)
PCO2 VEN: 48 mmHg (ref 44.0–60.0)
Patient temperature: 37
pH, Ven: 7.32 (ref 7.250–7.430)

## 2018-06-14 ENCOUNTER — Emergency Department: Payer: Medicaid Other

## 2018-06-14 ENCOUNTER — Other Ambulatory Visit: Payer: Self-pay

## 2018-06-14 ENCOUNTER — Inpatient Hospital Stay
Admission: EM | Admit: 2018-06-14 | Discharge: 2018-06-20 | DRG: 853 | Disposition: A | Discharge status: Home Health | Payer: Medicaid Other | Attending: Internal Medicine | Admitting: Internal Medicine

## 2018-06-14 DIAGNOSIS — Z681 Body mass index (BMI) 19 or less, adult: Secondary | ICD-10-CM | POA: Diagnosis not present

## 2018-06-14 DIAGNOSIS — E1043 Type 1 diabetes mellitus with diabetic autonomic (poly)neuropathy: Secondary | ICD-10-CM | POA: Diagnosis present

## 2018-06-14 DIAGNOSIS — L97519 Non-pressure chronic ulcer of other part of right foot with unspecified severity: Secondary | ICD-10-CM | POA: Diagnosis present

## 2018-06-14 DIAGNOSIS — A4102 Sepsis due to Methicillin resistant Staphylococcus aureus: Secondary | ICD-10-CM | POA: Diagnosis present

## 2018-06-14 DIAGNOSIS — D638 Anemia in other chronic diseases classified elsewhere: Secondary | ICD-10-CM | POA: Diagnosis present

## 2018-06-14 DIAGNOSIS — M6701 Short Achilles tendon (acquired), right ankle: Secondary | ICD-10-CM | POA: Diagnosis present

## 2018-06-14 DIAGNOSIS — Z91013 Allergy to seafood: Secondary | ICD-10-CM

## 2018-06-14 DIAGNOSIS — M6702 Short Achilles tendon (acquired), left ankle: Secondary | ICD-10-CM | POA: Diagnosis present

## 2018-06-14 DIAGNOSIS — K580 Irritable bowel syndrome with diarrhea: Secondary | ICD-10-CM | POA: Diagnosis present

## 2018-06-14 DIAGNOSIS — N179 Acute kidney failure, unspecified: Secondary | ICD-10-CM

## 2018-06-14 DIAGNOSIS — E10628 Type 1 diabetes mellitus with other skin complications: Secondary | ICD-10-CM | POA: Diagnosis present

## 2018-06-14 DIAGNOSIS — E10621 Type 1 diabetes mellitus with foot ulcer: Secondary | ICD-10-CM | POA: Diagnosis present

## 2018-06-14 DIAGNOSIS — E1065 Type 1 diabetes mellitus with hyperglycemia: Secondary | ICD-10-CM | POA: Diagnosis present

## 2018-06-14 DIAGNOSIS — Z833 Family history of diabetes mellitus: Secondary | ICD-10-CM

## 2018-06-14 DIAGNOSIS — Z794 Long term (current) use of insulin: Secondary | ICD-10-CM

## 2018-06-14 DIAGNOSIS — E43 Unspecified severe protein-calorie malnutrition: Secondary | ICD-10-CM | POA: Diagnosis present

## 2018-06-14 DIAGNOSIS — N183 Chronic kidney disease, stage 3 (moderate): Secondary | ICD-10-CM | POA: Diagnosis present

## 2018-06-14 DIAGNOSIS — F418 Other specified anxiety disorders: Secondary | ICD-10-CM | POA: Diagnosis present

## 2018-06-14 DIAGNOSIS — L03116 Cellulitis of left lower limb: Secondary | ICD-10-CM | POA: Diagnosis present

## 2018-06-14 DIAGNOSIS — Z91018 Allergy to other foods: Secondary | ICD-10-CM | POA: Diagnosis not present

## 2018-06-14 DIAGNOSIS — I129 Hypertensive chronic kidney disease with stage 1 through stage 4 chronic kidney disease, or unspecified chronic kidney disease: Secondary | ICD-10-CM | POA: Diagnosis present

## 2018-06-14 DIAGNOSIS — E871 Hypo-osmolality and hyponatremia: Secondary | ICD-10-CM | POA: Diagnosis present

## 2018-06-14 DIAGNOSIS — M869 Osteomyelitis, unspecified: Secondary | ICD-10-CM

## 2018-06-14 DIAGNOSIS — A419 Sepsis, unspecified organism: Secondary | ICD-10-CM

## 2018-06-14 DIAGNOSIS — K219 Gastro-esophageal reflux disease without esophagitis: Secondary | ICD-10-CM | POA: Diagnosis present

## 2018-06-14 DIAGNOSIS — E1022 Type 1 diabetes mellitus with diabetic chronic kidney disease: Secondary | ICD-10-CM | POA: Diagnosis present

## 2018-06-14 DIAGNOSIS — E860 Dehydration: Secondary | ICD-10-CM

## 2018-06-14 DIAGNOSIS — Z79899 Other long term (current) drug therapy: Secondary | ICD-10-CM

## 2018-06-14 DIAGNOSIS — M8610 Other acute osteomyelitis, unspecified site: Secondary | ICD-10-CM | POA: Diagnosis present

## 2018-06-14 DIAGNOSIS — Z89422 Acquired absence of other left toe(s): Secondary | ICD-10-CM

## 2018-06-14 DIAGNOSIS — Z888 Allergy status to other drugs, medicaments and biological substances status: Secondary | ICD-10-CM

## 2018-06-14 DIAGNOSIS — Z882 Allergy status to sulfonamides status: Secondary | ICD-10-CM

## 2018-06-14 HISTORY — DX: Osteomyelitis, unspecified: M86.9

## 2018-06-14 HISTORY — DX: Chronic kidney disease, unspecified: N18.9

## 2018-06-14 LAB — GLUCOSE, CAPILLARY
Glucose-Capillary: 472 mg/dL — ABNORMAL HIGH (ref 70–99)
Glucose-Capillary: 444 mg/dL — ABNORMAL HIGH (ref 70–99)
Glucose-Capillary: 450 mg/dL — ABNORMAL HIGH (ref 70–99)
Glucose-Capillary: 421 mg/dL — ABNORMAL HIGH (ref 70–99)

## 2018-06-14 LAB — COMPREHENSIVE METABOLIC PANEL
ALBUMIN: 2.4 g/dL — AB (ref 3.5–5.0)
ALK PHOS: 160 U/L — AB (ref 38–126)
ALT: 9 U/L (ref 0–44)
AST: 17 U/L (ref 15–41)
Anion gap: 15 (ref 5–15)
BUN: 20 mg/dL (ref 6–20)
CO2: 24 mmol/L (ref 22–32)
CREATININE: 2.14 mg/dL — AB (ref 0.61–1.24)
Calcium: 8.7 mg/dL — ABNORMAL LOW (ref 8.9–10.3)
Chloride: 90 mmol/L — ABNORMAL LOW (ref 98–111)
GFR, EST AFRICAN AMERICAN: 46 mL/min — AB (ref >60)
GFR, EST NON AFRICAN AMERICAN: 40 mL/min — AB (ref >60)
Glucose, Bld: 515 mg/dL (ref 70–99)
Potassium: 4.5 mmol/L (ref 3.5–5.1)
Sodium: 129 mmol/L — ABNORMAL LOW (ref 135–145)
Total Bilirubin: 0.8 mg/dL (ref 0.3–1.2)
Total Protein: 6.8 g/dL (ref 6.5–8.1)

## 2018-06-14 LAB — CBC WITH DIFFERENTIAL/PLATELET
Abs Immature Granulocytes: 0.15 10*3/uL — ABNORMAL HIGH (ref 0.00–0.07)
BASOS PCT: 0 %
Basophils Absolute: 0.1 10*3/uL (ref 0.0–0.1)
EOS PCT: 0 %
Eosinophils Absolute: 0 10*3/uL (ref 0.0–0.5)
HCT: 35.2 % — ABNORMAL LOW (ref 39.0–52.0)
Hemoglobin: 11.5 g/dL — ABNORMAL LOW (ref 13.0–17.0)
Immature Granulocytes: 1 %
LYMPHS PCT: 6 %
Lymphs Abs: 1.2 10*3/uL (ref 0.7–4.0)
MCH: 26 pg (ref 26.0–34.0)
MCHC: 32.7 g/dL (ref 30.0–36.0)
MCV: 79.6 fL — AB (ref 80.0–100.0)
MONO ABS: 1.2 10*3/uL — AB (ref 0.1–1.0)
MONOS PCT: 5 %
NEUTROS ABS: 18.6 10*3/uL — AB (ref 1.7–7.7)
NEUTROS PCT: 88 %
NRBC: 0 % (ref 0.0–0.2)
PLATELETS: 404 10*3/uL — AB (ref 150–400)
RBC: 4.42 MIL/uL (ref 4.22–5.81)
RDW: 13.9 % (ref 11.5–15.5)
WBC: 21.2 10*3/uL — ABNORMAL HIGH (ref 4.0–10.5)

## 2018-06-14 LAB — URINALYSIS, COMPLETE (UACMP) WITH MICROSCOPIC
Bacteria, UA: NONE SEEN
Bilirubin Urine: NEGATIVE
Glucose, UA: >=500 mg/dL — AB
KETONES UR: 5 mg/dL — AB
Leukocytes, UA: NEGATIVE
NITRITE: NEGATIVE
Protein, ur: >=300 mg/dL — AB
SPECIFIC GRAVITY, URINE: 1.02 (ref 1.005–1.030)
pH: 5 (ref 5.0–8.0)

## 2018-06-14 LAB — URINE DRUG SCREEN, QUALITATIVE (ARMC ONLY)
Amphetamines, Ur Screen: NOT DETECTED
Barbiturates, Ur Screen: NOT DETECTED
Benzodiazepine, Ur Scrn: NOT DETECTED
Cannabinoid 50 Ng, Ur ~~LOC~~: POSITIVE — AB
Cocaine Metabolite,Ur ~~LOC~~: NOT DETECTED
MDMA (Ecstasy)Ur Screen: NOT DETECTED
Methadone Scn, Ur: NOT DETECTED
Opiate, Ur Screen: NOT DETECTED
Phencyclidine (PCP) Ur S: NOT DETECTED
Tricyclic, Ur Screen: NOT DETECTED

## 2018-06-14 LAB — C-REACTIVE PROTEIN: CRP: 17.4 mg/dL — ABNORMAL HIGH (ref <1.0)

## 2018-06-14 LAB — SEDIMENTATION RATE: Sed Rate: 32 mm/hr — ABNORMAL HIGH (ref 0–15)

## 2018-06-14 LAB — LACTIC ACID, PLASMA: LACTIC ACID, VENOUS: 1.1 mmol/L (ref 0.5–1.9)

## 2018-06-14 LAB — CG4 I-STAT (LACTIC ACID): Lactic Acid, Venous: 1.91 mmol/L — ABNORMAL HIGH (ref 0.5–1.9)

## 2018-06-14 MED ORDER — OXYCODONE HCL 5 MG PO TABS
5.0000 mg | ORAL_TABLET | ORAL | Status: DC | PRN
  Administered 2018-06-14 – 2018-06-19 (×15): 5 mg via ORAL
  Filled 2018-06-14 (×16): qty 1

## 2018-06-14 MED ORDER — FLUOXETINE HCL 20 MG PO CAPS
20.0000 mg | ORAL_CAPSULE | Freq: Every day | ORAL | Status: DC
  Administered 2018-06-14 – 2018-06-19 (×6): 20 mg via ORAL
  Filled 2018-06-14 (×7): qty 1

## 2018-06-14 MED ORDER — FERROUS SULFATE 325 (65 FE) MG PO TABS
325.0000 mg | ORAL_TABLET | Freq: Two times a day (BID) | ORAL | Status: DC
  Administered 2018-06-15 – 2018-06-20 (×9): 325 mg via ORAL
  Filled 2018-06-14 (×11): qty 1

## 2018-06-14 MED ORDER — INSULIN GLARGINE 100 UNIT/ML ~~LOC~~ SOLN
20.0000 [IU] | Freq: Every day | SUBCUTANEOUS | Status: DC
  Administered 2018-06-14 – 2018-06-16 (×3): 20 [IU] via SUBCUTANEOUS
  Filled 2018-06-14 (×4): qty 0.2

## 2018-06-14 MED ORDER — HYDROMORPHONE HCL 1 MG/ML IJ SOLN
0.5000 mg | INTRAMUSCULAR | Status: DC | PRN
  Administered 2018-06-14 – 2018-06-16 (×11): 0.5 mg via INTRAVENOUS
  Filled 2018-06-14 (×10): qty 0.5
  Filled 2018-06-14: qty 1

## 2018-06-14 MED ORDER — DIPHENOXYLATE-ATROPINE 2.5-0.025 MG PO TABS: 1.0000 | ORAL_TABLET | Freq: Four times a day (QID) | ORAL | Status: DC | PRN

## 2018-06-14 MED ORDER — ONDANSETRON HCL 4 MG/2ML IJ SOLN
4.0000 mg | Freq: Four times a day (QID) | INTRAMUSCULAR | Status: DC | PRN
  Administered 2018-06-15 – 2018-06-18 (×5): 4 mg via INTRAVENOUS
  Filled 2018-06-14 (×5): qty 2

## 2018-06-14 MED ORDER — INSULIN ASPART 100 UNIT/ML ~~LOC~~ SOLN
0.0000 [IU] | Freq: Every day | SUBCUTANEOUS | Status: DC
  Administered 2018-06-14: 5 [IU] via SUBCUTANEOUS
  Administered 2018-06-16: 2 [IU] via SUBCUTANEOUS
  Administered 2018-06-19: 3 [IU] via SUBCUTANEOUS
  Filled 2018-06-14 (×3): qty 1

## 2018-06-14 MED ORDER — SODIUM CHLORIDE 0.9 % IV BOLUS
1000.0000 mL | Freq: Once | INTRAVENOUS | Status: AC
  Administered 2018-06-14: 1000 mL via INTRAVENOUS

## 2018-06-14 MED ORDER — LORAZEPAM 2 MG/ML IJ SOLN: 0.5000 mg | Freq: Once | INTRAMUSCULAR | Status: DC | PRN

## 2018-06-14 MED ORDER — ACETAMINOPHEN 325 MG PO TABS
650.0000 mg | ORAL_TABLET | Freq: Four times a day (QID) | ORAL | Status: DC | PRN
  Administered 2018-06-14 – 2018-06-18 (×2): 650 mg via ORAL
  Filled 2018-06-14 (×2): qty 2

## 2018-06-14 MED ORDER — INSULIN REGULAR HUMAN 100 UNIT/ML IJ SOLN
5.0000 [IU] | Freq: Once | INTRAMUSCULAR | Status: AC
  Administered 2018-06-14: 5 [IU] via INTRAVENOUS
  Filled 2018-06-14: qty 10

## 2018-06-14 MED ORDER — VANCOMYCIN HCL IN DEXTROSE 750-5 MG/150ML-% IV SOLN
750.0000 mg | INTRAVENOUS | Status: DC
  Administered 2018-06-15 – 2018-06-19 (×5): 750 mg via INTRAVENOUS
  Filled 2018-06-14 (×7): qty 150

## 2018-06-14 MED ORDER — INSULIN ASPART 100 UNIT/ML ~~LOC~~ SOLN
4.0000 [IU] | Freq: Three times a day (TID) | SUBCUTANEOUS | Status: DC
  Administered 2018-06-15 – 2018-06-20 (×14): 4 [IU] via SUBCUTANEOUS
  Filled 2018-06-14 (×15): qty 1

## 2018-06-14 MED ORDER — SODIUM CHLORIDE 0.9 % IV SOLN
3.0000 g | Freq: Four times a day (QID) | INTRAVENOUS | Status: DC
  Administered 2018-06-14 – 2018-06-15 (×2): 3 g via INTRAVENOUS
  Filled 2018-06-14 (×5): qty 3

## 2018-06-14 MED ORDER — ONDANSETRON HCL 4 MG/2ML IJ SOLN
4.0000 mg | Freq: Once | INTRAMUSCULAR | Status: AC
  Administered 2018-06-14: 4 mg via INTRAVENOUS
  Filled 2018-06-14: qty 2

## 2018-06-14 MED ORDER — ACETAMINOPHEN 650 MG RE SUPP: 650.0000 mg | Freq: Four times a day (QID) | RECTAL | Status: DC | PRN

## 2018-06-14 MED ORDER — SODIUM CHLORIDE 0.9 % IV SOLN
INTRAVENOUS | Status: DC
  Administered 2018-06-14 – 2018-06-19 (×14): via INTRAVENOUS

## 2018-06-14 MED ORDER — HYDROMORPHONE HCL 1 MG/ML IJ SOLN
0.5000 mg | INTRAMUSCULAR | Status: AC
  Administered 2018-06-14: 0.5 mg via INTRAVENOUS
  Filled 2018-06-14: qty 1

## 2018-06-14 MED ORDER — VANCOMYCIN HCL IN DEXTROSE 1-5 GM/200ML-% IV SOLN
1000.0000 mg | Freq: Once | INTRAVENOUS | Status: AC
  Administered 2018-06-14: 1000 mg via INTRAVENOUS
  Filled 2018-06-14: qty 200

## 2018-06-14 MED ORDER — SODIUM CHLORIDE 0.9 % IV SOLN
2.0000 g | Freq: Once | INTRAVENOUS | Status: AC
  Administered 2018-06-14: 2 g via INTRAVENOUS
  Filled 2018-06-14: qty 2

## 2018-06-14 MED ORDER — ONDANSETRON HCL 4 MG PO TABS
4.0000 mg | ORAL_TABLET | Freq: Four times a day (QID) | ORAL | Status: DC | PRN
  Administered 2018-06-17: 4 mg via ORAL
  Filled 2018-06-14: qty 1

## 2018-06-14 MED ORDER — ALPRAZOLAM 0.25 MG PO TABS
0.2500 mg | ORAL_TABLET | Freq: Three times a day (TID) | ORAL | Status: DC | PRN
  Administered 2018-06-16 – 2018-06-20 (×6): 0.25 mg via ORAL
  Filled 2018-06-14 (×7): qty 1

## 2018-06-14 MED ORDER — INSULIN ASPART 100 UNIT/ML ~~LOC~~ SOLN
5.0000 [IU] | Freq: Once | SUBCUTANEOUS | Status: AC
  Administered 2018-06-14: 5 [IU] via INTRAVENOUS
  Filled 2018-06-14: qty 1

## 2018-06-14 MED ORDER — INSULIN ASPART 100 UNIT/ML ~~LOC~~ SOLN
0.0000 [IU] | Freq: Three times a day (TID) | SUBCUTANEOUS | Status: DC
  Administered 2018-06-15: 3 [IU] via SUBCUTANEOUS
  Administered 2018-06-16: 1 [IU] via SUBCUTANEOUS
  Administered 2018-06-16: 2 [IU] via SUBCUTANEOUS
  Administered 2018-06-17: 1 [IU] via SUBCUTANEOUS
  Administered 2018-06-17: 2 [IU] via SUBCUTANEOUS
  Administered 2018-06-17: 5 [IU] via SUBCUTANEOUS
  Administered 2018-06-18 (×2): 1 [IU] via SUBCUTANEOUS
  Administered 2018-06-19: 2 [IU] via SUBCUTANEOUS
  Administered 2018-06-19: 1 [IU] via SUBCUTANEOUS
  Administered 2018-06-20 (×2): 3 [IU] via SUBCUTANEOUS
  Filled 2018-06-14 (×12): qty 1

## 2018-06-14 NOTE — ED Notes (Signed)
abx infusing 1st liter normal saline.

## 2018-06-14 NOTE — ED Notes (Signed)
As per patient has not been feeling well past few days, sent to ed by home health nurse. Recent b/l feet ( toes)amputations

## 2018-06-14 NOTE — ED Notes (Signed)
Glucose 515

## 2018-06-14 NOTE — ED Triage Notes (Addendum)
Pt comes via POV from home with c/o irregular HR and high BP per home health nurse. Pt also states foot pain, N/V.  Pt states bleeding in left foot and lots of pain in right foot. Pt is diabetic. Pt states amputation of toes as well.  Pt appears pale and HR is 139 on EKG monitor.

## 2018-06-14 NOTE — ED Notes (Signed)
BG 444

## 2018-06-14 NOTE — ED Notes (Signed)
Blood cxs obtained. meds given as ordered. Will monitor.

## 2018-06-14 NOTE — H&P (Signed)
[place] at [place] NAME: Shawn [name]    MR#:  [id number]  DATE OF BIRTH:  [birth year]  DATE OF ADMISSION:  06/14/2018  PRIMARY CARE PHYSICIAN: Valera Castle, MD   REQUESTING/REFERRING PHYSICIAN: Dr Delman Kitten  CHIEF COMPLAINT:   Chief Complaint  Patient presents with  . Tachycardia  . Nausea  . Foot Pain  . Emesis    HISTORY OF PRESENT ILLNESS:  Shawn [name]  is a 32 y.o. 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  He is presenting with 1 week worth of pain.  He states he has had fever at home but not measured pain is described 10 out of 10 bilateral feet.  Difficulty walking around.  He complains of some chills.  Some weakness.  In the ER he was found to have an elevated white count, tachycardic and hospitalist services were contacted for further evaluation.  PAST MEDICAL HISTORY:   Past Medical History:  Diagnosis Date  . CKD (chronic kidney disease)   . Diabetes mellitus without complication ([place])   . GERD (gastroesophageal reflux disease)   . IBS (irritable bowel syndrome)   . Osteomyelitis ([place])     PAST SURGICAL HISTORY:   Past Surgical History:  Procedure Laterality Date  . ABDOMINAL AORTOGRAM W/LOWER EXTREMITY Right 12/23/2017   Procedure: ABDOMINAL AORTOGRAM W/LOWER EXTREMITY;  Surgeon: Katha Cabal, MD;  Location: [facility];  Service: Cardiovascular;  Laterality: Right;  . AMPUTATION Right 12/24/2017   Procedure: AMPUTATION RAY;  Surgeon: Sharlotte Alamo, DPM;  Location: [facility];  Service: Podiatry;  Laterality: Right;  . AMPUTATION Left 04/06/2018   Procedure: AMPUTATION RAY;  Surgeon: Sharlotte Alamo, DPM;  Location: [facility];  Service: Podiatry;  Laterality: Left;  . AMPUTATION Left 04/09/2018   Procedure: AMPUTATION RAY;  Surgeon: Sharlotte Alamo, DPM;  Location: [facility];  Service: Podiatry;  Laterality: Left;  . APPLICATION  OF WOUND VAC Right 12/24/2017   Procedure: APPLICATION OF WOUND VAC;  Surgeon: Sharlotte Alamo, DPM;  Location: [facility];  Service: Podiatry;  Laterality: Right;  . APPLICATION OF WOUND VAC Right 01/01/2018   Procedure: APPLICATION OF WOUND VAC;  Surgeon: Albertine Patricia, DPM;  Location: [facility];  Service: Podiatry;  Laterality: Right;  . IRRIGATION AND DEBRIDEMENT FOOT Right 12/20/2017   Procedure: IRRIGATION AND DEBRIDEMENT FOOT;  Surgeon: Sharlotte Alamo, DPM;  Location: [facility];  Service: Podiatry;  Laterality: Right;  . IRRIGATION AND DEBRIDEMENT FOOT Right 12/24/2017   Procedure: IRRIGATION AND DEBRIDEMENT FOOT;  Surgeon: Sharlotte Alamo, DPM;  Location: [facility];  Service: Podiatry;  Laterality: Right;  . IRRIGATION AND DEBRIDEMENT FOOT Right 01/01/2018   Procedure: IRRIGATION AND DEBRIDEMENT FOOT-SKIN,SOFT TISSUE AND BONE;  Surgeon: Albertine Patricia, DPM;  Location: [facility];  Service: Podiatry;  Laterality: Right;  . IRRIGATION AND DEBRIDEMENT FOOT Left 04/06/2018   Procedure: IRRIGATION AND DEBRIDEMENT FOOT;  Surgeon: Sharlotte Alamo, DPM;  Location: [facility];  Service: Podiatry;  Laterality: Left;  . LOWER EXTREMITY ANGIOGRAPHY Left 04/06/2018   Procedure: Lower Extremity Angiography;  Surgeon: Algernon Huxley, MD;  Location: [facility];  Service: Cardiovascular;  Laterality: Left;    SOCIAL HISTORY:   Social History   Tobacco Use  . Smoking status: Never Smoker  . Smokeless tobacco: Never Used  Substance Use Topics  . Alcohol use: No    Frequency: Never    FAMILY HISTORY:   Family  History  Problem Relation Age of Onset  . Diabetes Mother   . Ovarian cancer Mother   . Healthy Father     DRUG ALLERGIES:   Allergies  Allergen Reactions  . Banana Hives, Nausea And Vomiting and Rash  . Keflex [Cephalexin] Rash    No swelling- also taken penicillin without any issue.  . Onion Hives, Nausea And Vomiting and Rash  . Sulfa Antibiotics Anaphylaxis  . Grapeseed Extract [Nutritional  Supplements] Itching  . Shellfish Allergy Hives    "ALL SEAFOOD"    REVIEW OF SYSTEMS:  CONSTITUTIONAL: Positive for chills and fever.  Positive for fatigue.  EYES: No blurred or double vision.  EARS, NOSE, AND THROAT: No tinnitus or ear pain. No sore throat RESPIRATORY: Positive for cough and shortness of breath.  No wheezing or hemoptysis.  CARDIOVASCULAR: Positive for chest pain, no orthopnea, edema.  GASTROINTESTINAL: No nausea, vomiting.  Positive for occasional diarrhea.  No abdominal pain. No blood in bowel movements GENITOURINARY: No dysuria, hematuria.  ENDOCRINE: No polyuria, nocturia,  HEMATOLOGY: No anemia, easy bruising or bleeding SKIN: No rash or lesion. MUSCULOSKELETAL: Positive for foot pain NEUROLOGIC: No tingling, numbness, weakness.  PSYCHIATRY: History of anxiety.   MEDICATIONS AT HOME:   Prior to Admission medications   Medication Sig Start Date End Date Taking? Authorizing Provider  ALPRAZolam (XANAX) 0.25 MG tablet Take 1 tablet (0.25 mg total) by mouth 3 (three) times daily as needed for anxiety. 12/27/17  Yes Loletha Grayer, MD  ferrous sulfate 325 (65 FE) MG tablet Take 1 tablet (325 mg total) by mouth 2 (two) times daily with a meal. 01/03/18  Yes Sainani, Belia Heman, MD  insulin glargine (LANTUS) 100 UNIT/ML injection Inject 0.2 mLs (20 Units total) into the skin at bedtime. 04/11/18  Yes Sainani, Belia Heman, MD  NOVOLIN R RELION 100 UNIT/ML injection Inject 0-0.1 mLs (0-10 Units total) into the skin 3 (three) times daily as needed for high blood sugar (sliding scale). 05/02/18 07/01/18 Yes Raegan Sipp, MD  oxyCODONE-acetaminophen (PERCOCET/ROXICET) 5-325 MG tablet Take 1 tablet by mouth every 6 (six) hours as needed. 05/02/18  Yes Dalila Arca, MD  amLODipine (NORVASC) 10 MG tablet Take 1 tablet (10 mg total) by mouth daily. 05/03/18   Loletha Grayer, MD  amoxicillin-clavulanate (AUGMENTIN) 875-125 MG tablet Take 1 tablet by mouth 2 (two) times daily.  05/16/18   Tsosie Billing, MD  diphenoxylate-atropine (LOMOTIL) 2.5-0.025 MG tablet Take 1 tablet by mouth 4 (four) times daily as needed for diarrhea or loose stools. 04/11/18 04/11/19  Henreitta Leber, MD  feeding supplement, GLUCERNA SHAKE, (GLUCERNA SHAKE) LIQD Take 237 mLs by mouth 3 (three) times daily between meals. 12/27/17   Loletha Grayer, MD  FLUoxetine (PROZAC) 20 MG capsule Take 1 capsule (20 mg total) by mouth at bedtime. 05/02/18   Loletha Grayer, MD  folic acid (FOLVITE) 1 MG tablet Take 1 tablet (1 mg total) by mouth daily. 05/03/18   Loletha Grayer, MD  Insulin Syringes, Disposable, U-100 0.3 ML MISC 1 Syringe by Does not apply route 4 (four) times daily -  with meals and at bedtime. 12/27/17   Loletha Grayer, MD  promethazine (PHENERGAN) 25 MG tablet Take 0.5 tablets (12.5 mg total) by mouth every 8 (eight) hours as needed for nausea or vomiting. 05/02/18   Loletha Grayer, MD      VITAL SIGNS:  Blood pressure (!) 129/94, pulse (!) 120, temperature 98.3 F (36.8 C), resp. rate 15, height '5\' 6"'$  (1.676 m), weight  52.2 kg, SpO2 98 %.  PHYSICAL EXAMINATION:  GENERAL:  32 y.o.-year-old patient lying in the bed with no acute distress.  EYES: Pupils equal, round, reactive to light and accommodation. No scleral icterus. Extraocular muscles intact.  HEENT: Head atraumatic, normocephalic. Oropharynx and nasopharynx clear.  Poor dentition with top front teeth falling out previously. NECK:  Supple, no jugular venous distention. No thyroid enlargement, no tenderness.  LUNGS: Normal breath sounds bilaterally, no wheezing, rales,rhonchi or crepitation. No use of accessory muscles of respiration.  CARDIOVASCULAR: S1, S2 tachycardic. No murmurs, rubs, or gallops.  ABDOMEN: Soft, nontender, nondistended. Bowel sounds present. No organomegaly or mass.  EXTREMITIES: No pedal edema, cyanosis, or clubbing.  NEUROLOGIC: Cranial nerves II through XII are intact. Muscle strength 5/5 in  all extremities. Sensation intact. Gait not checked.  PSYCHIATRIC: The patient is alert and oriented x 3.  SKIN: Right foot has 2 ulcerations.  One under the third MTP joint with a little blackish material on the ulceration.  Another ulceration on the lateral side of the foot with some soft tissue swelling and painful to palpation around that area.  Also a little blackish in that area.  The left foot has a large area that remained open from prior surgery.  And a small ulceration on that foot with some soft tissue swelling.  LABORATORY PANEL:   CBC Recent Labs  Lab 06/14/18 1408  WBC 21.2*  HGB 11.5*  HCT 35.2*  PLT 404*   ------------------------------------------------------------------------------------------------------------------  Chemistries  Recent Labs  Lab 06/14/18 1408  NA 129*  K 4.5  CL 90*  CO2 24  GLUCOSE 515*  BUN 20  CREATININE 2.14*  CALCIUM 8.7*  AST 17  ALT 9  ALKPHOS 160*  BILITOT 0.8   ------------------------------------------------------------------------------------------------------------------    RADIOLOGY:  Dg Chest 2 View  Result Date: 06/14/2018 CLINICAL DATA:  Tachycardia and hypertension EXAM: CHEST - 2 VIEW COMPARISON:  May 28, 2018 FINDINGS: No edema or consolidation. Heart size and pulmonary vascularity are normal. No adenopathy. No bone lesions. IMPRESSION: No edema or consolidation. Electronically Signed   By: Lowella Grip III M.D.   On: 06/14/2018 14:43    EKG:   Sinus tachycardia 139 bpm, left atrial enlargement  IMPRESSION AND PLAN:   1.  Clinical sepsis with bilateral feet infections, tachycardia and leukocytosis.  History of osteomyelitis and finished IV antibiotics on November 15.  Start empiric IV antibiotics with vancomycin and aztreonam.  Send off a sedimentation rate and an ESR.  Obtain MRIs of bilateral feet.  Consult infectious disease and podiatry. 2.  Diabetes mellitus with hyperglycemia start back on  sliding scale insulin with meal coverage and long-acting insulin. 3.  Acute kidney injury on chronic kidney disease Stage III.  Gentle IV fluid hydration. 4.  Hyponatremia secondary to uncontrolled diabetes 5.  Anxiety depression continue current medications 6.  Send off urine toxicology   All the records are reviewed and case discussed with ED provider. Management plans discussed with the patient, and he is in agreement.  CODE STATUS: Full code  TOTAL TIME TAKING CARE OF THIS PATIENT: 50 minutes.    Loletha Grayer M.D on 06/14/2018 at 5:59 PM  Between 7am to 6pm - Pager - [phone]  After 6pm call admission pager [phone]  [facility]  [phone]  CC: Primary care physician; Valera Castle, MD

## 2018-06-14 NOTE — ED Notes (Signed)
Full rainbow and one set of cultures sent to lab 

## 2018-06-14 NOTE — Consult Note (Signed)
CODE SEPSIS - PHARMACY COMMUNICATION  **Broad Spectrum Antibiotics should be administered within 1 hour of Sepsis diagnosis**  Time Code Sepsis Called/Page Received: 1710  Antibiotics Ordered: Aztreonam, Vancomycin  Time of 1st antibiotic administration: 1720  Additional action taken by pharmacy: n/a  If necessary, Name of Provider/Nurse Contacted: [place] ,PharmD Clinical Pharmacist  06/14/2018  5:20 PM

## 2018-06-14 NOTE — ED Notes (Signed)
BG above 472 - %units IV insulin given.

## 2018-06-14 NOTE — ED Provider Notes (Signed)
[facility] Emergency Department Provider Note   ____________________________________________   First MD Initiated Contact with Patient 06/14/18 1630     (approximate)  I have reviewed the triage vital signs and the nursing notes.   HISTORY  Chief Complaint Tachycardia; Nausea; Foot Pain; and Emesis    HPI Shawn [name] is a 32 y.o. male here for evaluation for nausea vomiting and increasing pain in his right foot  Patient reports the last couple days noticed blood sugars been going higher, he started to develop nausea and vomiting over the last day.  His home health nurse checked him today said his heart rate was high, they are concerned could be developing infection especially with increased pain in his right foot where he has had previous bone infections before.  Reports a moderate pain in the right foot.  Had a fever at home today.  Not sure of the actual measure.  Some nausea vomiting.  No abdominal pain.  No cough no chest pain or headache.  Feels like he is not staying well-hydrated.  Does continue to cover himself with his home insulin   Past Medical History:  Diagnosis Date  . Diabetes mellitus without complication ([place])   . GERD (gastroesophageal reflux disease)   . IBS (irritable bowel syndrome)     Patient Active Problem List   Diagnosis Date Noted  . Hyperglycemic hyperosmolar nonketotic coma ([place]) 05/28/2018  . Acute renal failure (ARF) ([place]) 04/27/2018  . Left foot infection 04/05/2018  . Foot infection 02/06/2018  . Osteomyelitis ([place]) 01/10/2018  . Cellulitis 12/29/2017  . Sepsis ([place]) 12/18/2017  . Moderate recurrent major depression ([place]) 11/10/2017  . Type 2 diabetes mellitus with hyperosmolar nonketotic hyperglycemia ([place]) 11/09/2017  . History of migraine 07/15/2017  . IBS (irritable bowel syndrome) 07/15/2017  . Protein-calorie malnutrition, severe 07/14/2017  . Abdominal pain 07/13/2017  . GERD (gastroesophageal  reflux disease) 12/30/2009  . Diabetes ([place]) 11/25/2009  . MDD (major depressive disorder) 11/25/2009  . Hypercholesterolemia 03/07/2008    Past Surgical History:  Procedure Laterality Date  . ABDOMINAL AORTOGRAM W/LOWER EXTREMITY Right 12/23/2017   Procedure: ABDOMINAL AORTOGRAM W/LOWER EXTREMITY;  Surgeon: Katha Cabal, MD;  Location: [place] [facility];  Service: Cardiovascular;  Laterality: Right;  . AMPUTATION Right 12/24/2017   Procedure: AMPUTATION RAY;  Surgeon: Sharlotte Alamo, DPM;  Location: [facility];  Service: Podiatry;  Laterality: Right;  . AMPUTATION Left 04/06/2018   Procedure: AMPUTATION RAY;  Surgeon: Sharlotte Alamo, DPM;  Location: [facility];  Service: Podiatry;  Laterality: Left;  . AMPUTATION Left 04/09/2018   Procedure: AMPUTATION RAY;  Surgeon: Sharlotte Alamo, DPM;  Location: [facility];  Service: Podiatry;  Laterality: Left;  . APPLICATION OF WOUND VAC Right 12/24/2017   Procedure: APPLICATION OF WOUND VAC;  Surgeon: Sharlotte Alamo, DPM;  Location: [facility];  Service: Podiatry;  Laterality: Right;  . APPLICATION OF WOUND VAC Right 01/01/2018   Procedure: APPLICATION OF WOUND VAC;  Surgeon: Albertine Patricia, DPM;  Location: [facility];  Service: Podiatry;  Laterality: Right;  . IRRIGATION AND DEBRIDEMENT FOOT Right 12/20/2017   Procedure: IRRIGATION AND DEBRIDEMENT FOOT;  Surgeon: Sharlotte Alamo, DPM;  Location: [facility];  Service: Podiatry;  Laterality: Right;  . IRRIGATION AND DEBRIDEMENT FOOT Right 12/24/2017   Procedure: IRRIGATION AND DEBRIDEMENT FOOT;  Surgeon: Sharlotte Alamo, DPM;  Location: [facility];  Service: Podiatry;  Laterality: Right;  . IRRIGATION AND DEBRIDEMENT FOOT Right 01/01/2018   Procedure: IRRIGATION AND DEBRIDEMENT FOOT-SKIN,SOFT TISSUE AND  BONE;  Surgeon: Albertine Patricia, DPM;  Location: [facility];  Service: Podiatry;  Laterality: Right;  . IRRIGATION AND DEBRIDEMENT FOOT Left 04/06/2018   Procedure: IRRIGATION AND DEBRIDEMENT FOOT;  Surgeon: Sharlotte Alamo, DPM;   Location: [facility];  Service: Podiatry;  Laterality: Left;  . LOWER EXTREMITY ANGIOGRAPHY Left 04/06/2018   Procedure: Lower Extremity Angiography;  Surgeon: Algernon Huxley, MD;  Location: [place] [facility];  Service: Cardiovascular;  Laterality: Left;    Prior to Admission medications   Medication Sig Start Date End Date Taking? Authorizing Provider  ALPRAZolam (XANAX) 0.25 MG tablet Take 1 tablet (0.25 mg total) by mouth 3 (three) times daily as needed for anxiety. 12/27/17   Loletha Grayer, MD  amLODipine (NORVASC) 10 MG tablet Take 1 tablet (10 mg total) by mouth daily. 05/03/18   Loletha Grayer, MD  amoxicillin-clavulanate (AUGMENTIN) 875-125 MG tablet Take 1 tablet by mouth 2 (two) times daily. 05/16/18   Tsosie Billing, MD  diphenoxylate-atropine (LOMOTIL) 2.5-0.025 MG tablet Take 1 tablet by mouth 4 (four) times daily as needed for diarrhea or loose stools. 04/11/18 04/11/19  Henreitta Leber, MD  feeding supplement, GLUCERNA SHAKE, (GLUCERNA SHAKE) LIQD Take 237 mLs by mouth 3 (three) times daily between meals. 12/27/17   Loletha Grayer, MD  ferrous sulfate 325 (65 FE) MG tablet Take 1 tablet (325 mg total) by mouth 2 (two) times daily with a meal. 01/03/18   Sainani, Belia Heman, MD  FLUoxetine (PROZAC) 20 MG capsule Take 1 capsule (20 mg total) by mouth at bedtime. 05/02/18   Loletha Grayer, MD  folic acid (FOLVITE) 1 MG tablet Take 1 tablet (1 mg total) by mouth daily. 05/03/18   Loletha Grayer, MD  insulin glargine (LANTUS) 100 UNIT/ML injection Inject 0.2 mLs (20 Units total) into the skin at bedtime. 04/11/18   Henreitta Leber, MD  Insulin Syringes, Disposable, U-100 0.3 ML MISC 1 Syringe by Does not apply route 4 (four) times daily -  with meals and at bedtime. 12/27/17   Wieting, Richard, MD  NOVOLIN R RELION 100 UNIT/ML injection Inject 0-0.1 mLs (0-10 Units total) into the skin 3 (three) times daily as needed for high blood sugar (sliding scale). 05/02/18 07/01/18  Loletha Grayer, MD  oxyCODONE-acetaminophen (PERCOCET/ROXICET) 5-325 MG tablet Take 1 tablet by mouth every 6 (six) hours as needed. 05/02/18   Loletha Grayer, MD  promethazine (PHENERGAN) 25 MG tablet Take 0.5 tablets (12.5 mg total) by mouth every 8 (eight) hours as needed for nausea or vomiting. 05/02/18   Loletha Grayer, MD    Allergies Banana; Keflex [cephalexin]; Onion; Sulfa antibiotics; Grapeseed extract [nutritional supplements]; and Shellfish allergy  Family History  Problem Relation Age of Onset  . Diabetes Mother   . Ovarian cancer Mother     Social History Social History   Tobacco Use  . Smoking status: Never Smoker  . Smokeless tobacco: Never Used  Substance Use Topics  . Alcohol use: No    Frequency: Never  . Drug use: Yes    Types: Marijuana, PCP    Review of Systems Constitutional: No fever/chills feels fatigue Eyes: No visual changes. ENT: No sore throat. Cardiovascular: Denies chest pain.  Heart rate was elevated when his nurse checked Respiratory: Denies shortness of breath. Gastrointestinal: No abdominal pain.   Genitourinary: Negative for dysuria. Musculoskeletal: Negative for back pain but some pain over the foot on the right with a previous amputation. Skin: Negative for rash. Neurological: Negative for headaches, areas of focal  weakness or numbness.    ____________________________________________   PHYSICAL EXAM:  VITAL SIGNS: ED Triage Vitals  Enc Vitals Group     BP 06/14/18 1350 91/69     Pulse Rate 06/14/18 1350 (!) 138     Resp 06/14/18 1350 18     Temp 06/14/18 1350 98.3 F (36.8 C)     Temp src --      SpO2 06/14/18 1350 98 %     Weight 06/14/18 1345 115 lb (52.2 kg)     Height 06/14/18 1345 5\' 6"  (1.676 m)     Head Circumference --      Peak Flow --      Pain Score 06/14/18 1345 10     Pain Loc --      Pain Edu? --      Excl. in [place]? --     Constitutional: Alert and oriented.  Appears ill but in no acute distress.  He is  very pleasant. Eyes: Conjunctivae are normal. Head: Atraumatic. Nose: No congestion/rhinnorhea. Mouth/Throat: Mucous membranes are markedly dry. Neck: No stridor.  Cardiovascular: Tachycardic rate, regular rhythm. Grossly normal heart sounds.  Good peripheral circulation. Respiratory: Normal respiratory effort.  No retractions. Lungs CTAB. Gastrointestinal: Soft and nontender. No distention. Musculoskeletal: No lower extremity tenderness but he does report discomfort over the right lateral foot, he has ulcers that have central necrosis but no clear purulent drainage.  The right foot is slightly warm to touch.  There is good capillary refill and strong dorsalis pedis pulse in the lower extremities bilateral.  Left foot also has open ulcer, warmth and redness slightly surrounding. Neurologic:  Normal speech and language. No gross focal neurologic deficits are appreciated.  Skin:  Skin is warm, dry and intact. No rash noted. Psychiatric: Mood and affect are normal. Speech and behavior are normal.  ____________________________________________   LABS (all labs ordered are listed, but only abnormal results are displayed)  Labs Reviewed  COMPREHENSIVE METABOLIC PANEL - Abnormal; Notable for the following components:      Result Value   Sodium 129 (*)    Chloride 90 (*)    Glucose, Bld 515 (*)    Creatinine, Ser 2.14 (*)    Calcium 8.7 (*)    Albumin 2.4 (*)    Alkaline Phosphatase 160 (*)    GFR calc non Af Amer 40 (*)    GFR calc Af Amer 46 (*)    All other components within normal limits  CBC WITH DIFFERENTIAL/PLATELET - Abnormal; Notable for the following components:   WBC 21.2 (*)    Hemoglobin 11.5 (*)    HCT 35.2 (*)    MCV 79.6 (*)    Platelets 404 (*)    Neutro Abs 18.6 (*)    Monocytes Absolute 1.2 (*)    Abs Immature Granulocytes 0.15 (*)    All other components within normal limits  CG4 I-STAT (LACTIC ACID) - Abnormal; Notable for the following components:   Lactic  Acid, Venous 1.91 (*)    All other components within normal limits  CULTURE, BLOOD (SINGLE)  CULTURE, BLOOD (ROUTINE X 2)  CULTURE, BLOOD (ROUTINE X 2)  URINALYSIS, COMPLETE (UACMP) WITH MICROSCOPIC  I-STAT CG4 LACTIC ACID, ED  CBG MONITORING, ED  CBG MONITORING, ED  CBG MONITORING, ED  CBG MONITORING, ED  CBG MONITORING, ED  CBG MONITORING, ED  CBG MONITORING, ED  CBG MONITORING, ED  CBG MONITORING, ED  CBG MONITORING, ED  CBG MONITORING, ED  CBG MONITORING, ED  ____________________________________________  EKG  Reviewed enterotomy at 1410 Heart rate 140 QRS 80 QTc 440 Sinus tachycardia without evidence of acute ischemia ____________________________________________  RADIOLOGY  Dg Chest 2 View  Result Date: 06/14/2018 CLINICAL DATA:  Tachycardia and hypertension EXAM: CHEST - 2 VIEW COMPARISON:  May 28, 2018 FINDINGS: No edema or consolidation. Heart size and pulmonary vascularity are normal. No adenopathy. No bone lesions. IMPRESSION: No edema or consolidation. Electronically Signed   By: Lowella Grip III M.D.   On: 06/14/2018 14:43    Chest x-ray reviewed no acute ____________________________________________   PROCEDURES  Procedure(s) performed: None  Procedures  Critical Care performed: Yes, see critical care note(s)  CRITICAL CARE Performed by: Delman Kitten   Total critical care time: 38 minutes  Critical care time was exclusive of separately billable procedures and treating other patients.  Critical care was necessary to treat or prevent imminent or life-threatening deterioration.  Critical care was time spent personally by me on the following activities: development of treatment plan with patient and/or surrogate as well as nursing, discussions with consultants, evaluation of patient's response to treatment, examination of patient, obtaining history from patient or surrogate, ordering and performing treatments and interventions, ordering and  review of laboratory studies, ordering and review of radiographic studies, pulse oximetry and re-evaluation of patient's condition.  ____________________________________________   INITIAL IMPRESSION / ASSESSMENT AND PLAN / ED COURSE  Pertinent labs & imaging results that were available during my care of the patient were reviewed by me and considered in my medical decision making (see chart for details).   Concern for possible sepsis, does not appear to have acute DKA at this time but does have hyperglycemia.  Appears volume down, will begin volume resuscitation given hypotension and tachycardia.  Current map greater than 65 with blood pressure greater than 90, but I am concerned about a potential progression from sepsis to severe sepsis given the patient's presentation, slightly elevated lactic acid.  High concern for possible osteomyelitis or cellulitis in the foot as the etiology.  No other obvious source is identified at this time.  Broad-spectrum antibiotics ordered, allergic to cephalosporin.  Code sepsis paged.    sepsis reassessment ----------------------------------------- 5:21 PM on 06/14/2018 -----------------------------------------  Patient remains awake and alert.,  Heart rate improving to 120.   ____________________________________________   FINAL CLINICAL IMPRESSION(S) / ED DIAGNOSES  Final diagnoses:  Sepsis, due to unspecified organism, unspecified whether acute organ dysfunction present ([place])  AKI (acute kidney injury) ([place])  Dehydration, moderate        Note:  This document was prepared using Dragon voice recognition software and may include unintentional dictation errors       Delman Kitten, MD 06/15/18 1933

## 2018-06-14 NOTE — Consult Note (Addendum)
Pharmacy Antibiotic Note  Shawn [name] is a 32 y.o. male admitted on 06/14/2018 with cellulitis.  Pharmacy has been consulted for Aztreonam and Vancomycin Dosing dosing.  Plan: Unasyn 3g q6h Vancomycin x 1g 12/11 @1755  in ED, followed by stacked dosing 16 hours after initial dose   Vancomycin 750mg  q 18h  Ke=0.051, Vd = 36.75, t 1/2  Will check trough 30 min prior to 4th scheduled dose 12/14 @ 1550  Height: 5\' 6"  (167.6 cm) Weight: 115 lb (52.2 kg) IBW/kg (Calculated) : 63.8  Temp (24hrs), Avg:98.3 F (36.8 C), Min:98.3 F (36.8 C), Max:98.3 F (36.8 C)  Recent Labs  Lab 06/14/18 1400 06/14/18 1408  WBC  --  21.2*  CREATININE  --  2.14*  LATICACIDVEN 1.91*  --     Estimated Creatinine Clearance: 37.3 mL/min (A) (by C-G formula based on SCr of 2.14 mg/dL (H)).    Allergies  Allergen Reactions  . Banana Hives, Nausea And Vomiting and Rash  . Keflex [Cephalexin] Rash    No swelling- also taken penicillin without any issue.  . Onion Hives, Nausea And Vomiting and Rash  . Sulfa Antibiotics Anaphylaxis  . Grapeseed Extract [Nutritional Supplements] Itching  . Shellfish Allergy Hives    "ALL SEAFOOD"    Antimicrobials this admission: Vancomycin 12/11 >>  Aztreonam 12/11 >>   Dose adjustments this admission: n/a  Microbiology results: 12/11 BCx: pending   Thank you for allowing pharmacy to be a part of this patient's care.  Lu Duffel, PharmD, BCPS Clinical Pharmacist 06/14/2018 6:33 PM

## 2018-06-14 NOTE — ED Notes (Signed)
Report given [patient to unit.

## 2018-06-15 ENCOUNTER — Inpatient Hospital Stay: Admit: 2018-06-15 | Discharge: 2018-06-15 | Disposition: A | Discharge status: Home / Self Care | Payer: Medicaid Other

## 2018-06-15 ENCOUNTER — Inpatient Hospital Stay: Payer: Medicaid Other

## 2018-06-15 LAB — BLOOD CULTURE ID PANEL (REFLEXED)
Acinetobacter baumannii: NOT DETECTED
Candida albicans: NOT DETECTED
Candida glabrata: NOT DETECTED
Candida krusei: NOT DETECTED
Candida parapsilosis: NOT DETECTED
Candida tropicalis: NOT DETECTED
ESCHERICHIA COLI: NOT DETECTED
Enterobacter cloacae complex: NOT DETECTED
Enterobacteriaceae species: NOT DETECTED
Enterococcus species: NOT DETECTED
Haemophilus influenzae: NOT DETECTED
Klebsiella oxytoca: NOT DETECTED
Klebsiella pneumoniae: NOT DETECTED
Listeria monocytogenes: NOT DETECTED
Methicillin resistance: DETECTED — AB
Neisseria meningitidis: NOT DETECTED
Proteus species: NOT DETECTED
Pseudomonas aeruginosa: NOT DETECTED
Serratia marcescens: NOT DETECTED
Staphylococcus aureus (BCID): DETECTED — AB
Staphylococcus species: DETECTED — AB
Streptococcus agalactiae: NOT DETECTED
Streptococcus pneumoniae: NOT DETECTED
Streptococcus pyogenes: NOT DETECTED
Streptococcus species: NOT DETECTED

## 2018-06-15 LAB — CBC
HCT: 35.3 % — ABNORMAL LOW (ref 39.0–52.0)
Hemoglobin: 11.2 g/dL — ABNORMAL LOW (ref 13.0–17.0)
MCH: 25.9 pg — ABNORMAL LOW (ref 26.0–34.0)
MCHC: 31.7 g/dL (ref 30.0–36.0)
MCV: 81.5 fL (ref 80.0–100.0)
Platelets: 333 10*3/uL (ref 150–400)
RBC: 4.33 MIL/uL (ref 4.22–5.81)
RDW: 13.8 % (ref 11.5–15.5)
WBC: 12.3 10*3/uL — ABNORMAL HIGH (ref 4.0–10.5)
nRBC: 0 % (ref 0.0–0.2)

## 2018-06-15 LAB — BASIC METABOLIC PANEL
Anion gap: 9 (ref 5–15)
BUN: 20 mg/dL (ref 6–20)
CO2: 26 mmol/L (ref 22–32)
Calcium: 8.6 mg/dL — ABNORMAL LOW (ref 8.9–10.3)
Chloride: 99 mmol/L (ref 98–111)
Creatinine, Ser: 1.73 mg/dL — ABNORMAL HIGH (ref 0.61–1.24)
GFR calc Af Amer: >60 mL/min (ref >60)
GFR calc non Af Amer: 52 mL/min — ABNORMAL LOW (ref >60)
Glucose, Bld: 262 mg/dL — ABNORMAL HIGH (ref 70–99)
Potassium: 4 mmol/L (ref 3.5–5.1)
Sodium: 134 mmol/L — ABNORMAL LOW (ref 135–145)

## 2018-06-15 LAB — ECHOCARDIOGRAM COMPLETE
Height: 66 in
WEIGHTICAEL: 1840.01 [oz_av]

## 2018-06-15 LAB — HEMOGLOBIN A1C
Hgb A1c MFr Bld: 10.8 % — ABNORMAL HIGH (ref 4.8–5.6)
Mean Plasma Glucose: 263.26 mg/dL

## 2018-06-15 LAB — GLUCOSE, CAPILLARY
Glucose-Capillary: 245 mg/dL — ABNORMAL HIGH (ref 70–99)
Glucose-Capillary: 237 mg/dL — ABNORMAL HIGH (ref 70–99)
Glucose-Capillary: 71 mg/dL (ref 70–99)
Glucose-Capillary: 93 mg/dL (ref 70–99)
Glucose-Capillary: 129 mg/dL — ABNORMAL HIGH (ref 70–99)

## 2018-06-15 LAB — LACTIC ACID, PLASMA: Lactic Acid, Venous: 0.6 mmol/L (ref 0.5–1.9)

## 2018-06-15 MED ORDER — PIPERACILLIN-TAZOBACTAM 3.375 G IVPB
3.3750 g | Freq: Three times a day (TID) | INTRAVENOUS | Status: DC
  Administered 2018-06-15 – 2018-06-20 (×15): 3.375 g via INTRAVENOUS
  Filled 2018-06-15 (×17): qty 50

## 2018-06-15 MED ORDER — METOCLOPRAMIDE HCL 5 MG/ML IJ SOLN
5.0000 mg | Freq: Three times a day (TID) | INTRAMUSCULAR | Status: DC
  Filled 2018-06-15: qty 2

## 2018-06-15 MED ORDER — METRONIDAZOLE 500 MG PO TABS
500.0000 mg | ORAL_TABLET | Freq: Three times a day (TID) | ORAL | Status: DC
  Filled 2018-06-15 (×2): qty 1

## 2018-06-15 MED ORDER — SODIUM CHLORIDE 0.9 % IV SOLN
INTRAVENOUS | Status: DC | PRN
  Administered 2018-06-15 (×2): 250 mL via INTRAVENOUS

## 2018-06-15 MED ORDER — PROMETHAZINE HCL 25 MG/ML IJ SOLN
25.0000 mg | Freq: Four times a day (QID) | INTRAMUSCULAR | Status: DC | PRN
  Administered 2018-06-15 – 2018-06-20 (×11): 25 mg via INTRAVENOUS
  Filled 2018-06-15 (×11): qty 1

## 2018-06-15 NOTE — Progress Notes (Signed)
[facility]  Patient Status: Active  [facility] is providing the following services: SN  If patient discharges after hours, please call [phone].   Shawn [name] 06/15/2018, 10:36 AM

## 2018-06-15 NOTE — Progress Notes (Addendum)
Inpatient Diabetes Program Recommendations  AACE/ADA: New Consensus Statement on Inpatient Glycemic Control (2019)  Target Ranges:  Prepandial:   less than 140 mg/dL      Peak postprandial:   less than 180 mg/dL (1-2 hours)      Critically ill patients:  140 - 180 mg/dL  Results for Shawn [name], Shawn [name] (MRN [id number]) as of 06/15/2018 09:01  Ref. Range 06/14/2018 17:15 06/14/2018 18:24 06/14/2018 19:20 06/14/2018 20:32 06/15/2018 03:50 06/15/2018 07:31  Glucose-Capillary Latest Ref Range: 70 - 99 mg/dL 472 (H) 444 (H) 450 (H) 421 (H) 245 (H) 237 (H)   Results for Shawn [name], Shawn [name] (MRN [id number]) as of 06/15/2018 09:01  Ref. Range 07/13/2017 14:23 11/10/2017 04:46 12/19/2017 03:41 04/05/2018 17:39 06/14/2018 14:08  Hemoglobin A1C Latest Ref Range: 4.8 - 5.6 % 16.1 (H) 17.2 (H) 16.9 (H) 12.9 (H) 10.8 (H)   Review of Glycemic Control  Diabetes history: DM1 (makes NO insulin; requires basal, correction, and meal coverage insulin) Outpatient Diabetes medications: Lantus 20 units QHS, Regular 2 units with meals, plus additional units if glucose is elevated TID Current orders for Inpatient glycemic control: Latnus 20 units QHS, Novolog 4 units TID with meals, Novolog 0-9 units TID with meals, Novolog 0-5 units QHS  Inpatient Diabetes Program Recommendations:  HgbA1C: A1C 10.8% on 06/14/18 indicating an average glucose of 263 mg/dl over the past 2-3 months. Current A1C not at goal of 7% or less; however, improved from 12.9% on 04/05/18. Patient is well known to Inpatient Diabetes team. Patient has Type 1 DM and this is his 8th admission in the past 6 months.  NOTE: Noted consult for Diabetes Coordinator. Chart reviewed. Patient is well known to Inpatient Diabetes Team and our team has spoken with patient several times this year. Noted A1C has significantly improved from 16.9% on 12/19/17 to 10.8% on 06/14/18. Will plan to follow up with patient today. Agree with current orders at this time.  Addendum  06/15/18@12 :25-Spoke with patient regarding DM control and outpatient DM regimen. Patient states that he is taking Lantus 20 units QHS and Regular 2 units with meals plus additional units if glucose is elevated. Patient states that he now has Medicaid, a PCP, and has established care with [name] (Endocrinologist). Patient last seen [name] on 05/11/18.  Patient has been trying to do better with DM management and states he checks his glucose at least 2 times per day and that it ranges from 130-260's mg/dl most of the time. Patient states that his fasting is usually 200 - low 200's mg/dl. Informed patient that his current A1C is 10.8% on 06/14/18 which is improved from prior A1C's this year.   Praised patient for doing better with DM control. Encouraged patient to continue to work toward A1C of 7% or less in order to decrease risk of further complications from uncontrolled DM. Encouraged patient to try to check glucose 3-4 times per day and to continue to follow up with [name] so he can assist with improving DM control. Patient states that he has everything he needs for DM control. Patient verbalized understanding of information discussed and he states that he has no further questions at this time.  Thanks, [name], RN, MSN, CDE Diabetes Coordinator Inpatient Diabetes Program [phone] (Team Pager from 8am to 5pm)

## 2018-06-15 NOTE — Consult Note (Signed)
[facility] Podiatry                                                      Patient Demographics  Shawn [name], is a 32 y.o. male   MRN: [id number]   DOB - Feb 22, 1986  Admit Date - 06/14/2018    Outpatient Primary MD for the patient is Olmedo, Guy Begin, MD  Consult requested in the Hospital by Epifanio Lesches, MD, On 06/15/2018    Reason for consult bilateral foot infections   With History of -  Past Medical History:  Diagnosis Date  . CKD (chronic kidney disease)   . Diabetes mellitus without complication ([place])   . GERD (gastroesophageal reflux disease)   . IBS (irritable bowel syndrome)   . Osteomyelitis [facility])       Past Surgical History:  Procedure Laterality Date  . ABDOMINAL AORTOGRAM W/LOWER EXTREMITY Right 12/23/2017   Procedure: ABDOMINAL AORTOGRAM W/LOWER EXTREMITY;  Surgeon: Katha Cabal, MD;  Location: [place] [facility];  Service: Cardiovascular;  Laterality: Right;  . AMPUTATION Right 12/24/2017   Procedure: AMPUTATION RAY;  Surgeon: Sharlotte Alamo, DPM;  Location: [facility];  Service: Podiatry;  Laterality: Right;  . AMPUTATION Left 04/06/2018   Procedure: AMPUTATION RAY;  Surgeon: Sharlotte Alamo, DPM;  Location: [facility];  Service: Podiatry;  Laterality: Left;  . AMPUTATION Left 04/09/2018   Procedure: AMPUTATION RAY;  Surgeon: Sharlotte Alamo, DPM;  Location: [facility];  Service: Podiatry;  Laterality: Left;  . APPLICATION OF WOUND VAC Right 12/24/2017   Procedure: APPLICATION OF WOUND VAC;  Surgeon: Sharlotte Alamo, DPM;  Location: [facility];  Service: Podiatry;  Laterality: Right;  . APPLICATION OF WOUND VAC Right 01/01/2018   Procedure: APPLICATION OF WOUND VAC;  Surgeon: Albertine Patricia, DPM;  Location: [facility];  Service: Podiatry;  Laterality: Right;  . IRRIGATION AND DEBRIDEMENT FOOT Right 12/20/2017   Procedure: IRRIGATION AND  DEBRIDEMENT FOOT;  Surgeon: Sharlotte Alamo, DPM;  Location: [facility];  Service: Podiatry;  Laterality: Right;  . IRRIGATION AND DEBRIDEMENT FOOT Right 12/24/2017   Procedure: IRRIGATION AND DEBRIDEMENT FOOT;  Surgeon: Sharlotte Alamo, DPM;  Location: [facility];  Service: Podiatry;  Laterality: Right;  . IRRIGATION AND DEBRIDEMENT FOOT Right 01/01/2018   Procedure: IRRIGATION AND DEBRIDEMENT FOOT-SKIN,SOFT TISSUE AND BONE;  Surgeon: Albertine Patricia, DPM;  Location: [facility];  Service: Podiatry;  Laterality: Right;  . IRRIGATION AND DEBRIDEMENT FOOT Left 04/06/2018   Procedure: IRRIGATION AND DEBRIDEMENT FOOT;  Surgeon: Sharlotte Alamo, DPM;  Location: [facility];  Service: Podiatry;  Laterality: Left;  . LOWER EXTREMITY ANGIOGRAPHY Left 04/06/2018   Procedure: Lower Extremity Angiography;  Surgeon: Algernon Huxley, MD;  Location: [place] [facility];  Service: Cardiovascular;  Laterality: Left;    in for   Chief Complaint  Patient presents with  . Tachycardia  . Nausea  . Foot Pain  . Emesis     HPI  Shawn [name]  is a 32 y.o. 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  Been followed in the past by myself as well as my partner Dr. Sharlotte Alamo.  He had fourth fifth rays removed from both feet.  He had a large swath of skin there open and Dr. Cleda Mccreedy did an excellent job of promoting healing and surgically  cleaning the areas that needed to be removed and the patient made remarkable progress with that.  Unfortunately because of his abnormal foot structure the extremely tight tendons he tends to continue to put pressure especially on the outsides of both feet whenever he gets up to go the bathroom or try to walk.  He has a significant history of diabetes it has been very poorly controlled.  Type 1 diabetes.    Review of Systems    In addition to the HPI above,  No Fever-chills, No Headache, No changes with Vision or hearing, No problems  swallowing food or Liquids, No Chest pain, Cough or Shortness of Breath, No Abdominal pain, No Nausea or Vommitting, Bowel movements are regular, No Blood in stool or Urine, No dysuria, No new skin rashes or bruises, No new joints pains-aches,  No new weakness, tingling, numbness in any extremity, No recent weight gain or loss, No polyuria, polydypsia or polyphagia, No significant Mental Stressors.  A full 10 point Review of Systems was done, except as stated above, all other Review of Systems were negative.   Social History Social History   Tobacco Use  . Smoking status: Never Smoker  . Smokeless tobacco: Never Used  Substance Use Topics  . Alcohol use: No    Frequency: Never    Family History Family History  Problem Relation Age of Onset  . Diabetes Mother   . Ovarian cancer Mother   . Healthy Father     Prior to Admission medications   Medication Sig Start Date End Date Taking? Authorizing Provider  ALPRAZolam (XANAX) 0.25 MG tablet Take 1 tablet (0.25 mg total) by mouth 3 (three) times daily as needed for anxiety. 12/27/17  Yes Loletha Grayer, MD  diphenoxylate-atropine (LOMOTIL) 2.5-0.025 MG tablet Take 1 tablet by mouth 4 (four) times daily as needed for diarrhea or loose stools. 04/11/18 04/11/19 Yes Henreitta Leber, MD  ferrous sulfate 325 (65 FE) MG tablet Take 1 tablet (325 mg total) by mouth 2 (two) times daily with a meal. 01/03/18  Yes Sainani, Belia Heman, MD  insulin glargine (LANTUS) 100 UNIT/ML injection Inject 0.2 mLs (20 Units total) into the skin at bedtime. 04/11/18  Yes Sainani, Belia Heman, MD  Insulin Syringes, Disposable, U-100 0.3 ML MISC 1 Syringe by Does not apply route 4 (four) times daily -  with meals and at bedtime. 12/27/17  Yes Wieting, Richard, MD  lisinopril (PRINIVIL,ZESTRIL) 20 MG tablet Take 20 mg by mouth 2 (two) times daily. 05/25/18 05/25/19 Yes [provider]  NOVOLIN R RELION 100 UNIT/ML injection Inject 0-0.1 mLs (0-10 Units total)  into the skin 3 (three) times daily as needed for high blood sugar (sliding scale). 05/02/18 07/01/18 Yes Wieting, Richard, MD  oxyCODONE-acetaminophen (PERCOCET/ROXICET) 5-325 MG tablet Take 1 tablet by mouth every 6 (six) hours as needed. Patient taking differently: Take 1-2 tablets by mouth See admin instructions. Take 1 to 2 tablets by mouth every 4 to 6 hours as needed for pain 05/02/18  Yes Wieting, Richard, MD  potassium chloride SA (K-DUR,KLOR-CON) 20 MEQ tablet Take 20 mEq by mouth 2 (two) times daily.   Yes [provider]  amoxicillin-clavulanate (AUGMENTIN) 875-125 MG tablet Take 1 tablet by mouth 2 (two) times daily. Patient not taking: Reported on 06/14/2018 05/16/18   Tsosie Billing, MD  FLUoxetine (PROZAC) 20 MG capsule Take 1 capsule (20 mg total) by mouth at bedtime. Patient not taking: Reported on 06/14/2018 05/02/18   Loletha Grayer, MD  Anti-infectives (From admission, onward)   Start     Dose/Rate Route Frequency Ordered Stop   06/15/18 1400  metroNIDAZOLE (FLAGYL) tablet 500 mg  Status:  Discontinued     500 mg Oral Every 8 hours 06/15/18 1133 06/15/18 1204   06/15/18 1400  piperacillin-tazobactam (ZOSYN) IVPB 3.375 g     3.375 g 12.5 mL/hr over 240 Minutes Intravenous Every 8 hours 06/15/18 1215     06/15/18 1000  vancomycin (VANCOCIN) IVPB 750 mg/150 ml premix     750 mg 150 mL/hr over 60 Minutes Intravenous Every 18 hours 06/14/18 1842     06/15/18 0000  Ampicillin-Sulbactam (UNASYN) 3 g in sodium chloride 0.9 % 100 mL IVPB  Status:  Discontinued     3 g 200 mL/hr over 30 Minutes Intravenous Every 6 hours 06/14/18 1851 06/15/18 1133   06/14/18 1700  vancomycin (VANCOCIN) IVPB 1000 mg/200 mL premix     1,000 mg 200 mL/hr over 60 Minutes Intravenous  Once 06/14/18 1655 06/14/18 1855   06/14/18 1700  aztreonam (AZACTAM) 2 g in sodium chloride 0.9 % 100 mL IVPB     2 g 200 mL/hr over 30 Minutes Intravenous  Once 06/14/18 1655 06/14/18 1754       Scheduled Meds: . ferrous sulfate  325 mg Oral BID WC  . FLUoxetine  20 mg Oral QHS  . insulin aspart  0-5 Units Subcutaneous QHS  . insulin aspart  0-9 Units Subcutaneous TID WC  . insulin aspart  4 Units Subcutaneous TID WC  . insulin glargine  20 Units Subcutaneous QHS   Continuous Infusions: . sodium chloride 125 mL/hr at 06/15/18 1351  . sodium chloride 250 mL (06/15/18 1352)  . piperacillin-tazobactam (ZOSYN)  IV 12.5 mL/hr at 06/15/18 1346  . vancomycin Stopped (06/15/18 1010)   PRN Meds:.sodium chloride, acetaminophen **OR** acetaminophen, ALPRAZolam, diphenoxylate-atropine, HYDROmorphone (DILAUDID) injection, LORazepam, ondansetron **OR** ondansetron (ZOFRAN) IV, oxyCODONE, promethazine  Allergies  Allergen Reactions  . Banana Hives, Nausea And Vomiting and Rash  . Keflex [Cephalexin] Rash    No swelling- also taken penicillin without any issue.  . Onion Hives, Nausea And Vomiting and Rash  . Sulfa Antibiotics Anaphylaxis  . Grapeseed Extract [Nutritional Supplements] Itching  . Shellfish Allergy Hives    "ALL SEAFOOD"    Physical Exam  Vitals  Blood pressure (!) 136/97, pulse (!) 116, temperature 99.5 F (37.5 C), temperature source Oral, resp. rate 16, height '5\' 6"'$  (1.676 m), weight 52.2 kg, SpO2 97 %.  Lower Extremity exam:  Vascular: Patient had bilateral angioplasties and stenting to try to increase and improve blood flow to both lower extremities is been helpful in some of his healing process.  Dermatological: He has 2 wounds on the right foot one at the fifth metatarsal base and what in the sub-met 3 metatarsal head region on the right foot.  Some mild drainage is coming from the submetatarsal head region.  On the left foot he has a granular wound on the dorsum of the foot that is really closing quite a bit but there is a new wound on the sub-met 5 tuberosity area that is draining purulence and with compression of the area pus comes out there as well as  from the more dorsal aspect of the wound where that connects to the granular tissue and is consistent with infection as well.  Neurological: Significant peripheral neuropathy  Ortho: Fourth fifth ray amputations to both feet secondary to osteomyelitis and infection to soft tissue as well.  The right foot had healed up for the most part but redeveloped full-thickness ulcerations to the fifth met tuberosity as well as the third metatarsal head.  The left foot still has the fourth fifth metatarsal bases but the plantar aspect of the lateral fifth metatarsal base has ulcerated.  These areas are very prominent.  He has extremely tight Achilles tendon so when he walks he likely puts excessive pressure across these regions this timeframe also.  Data Review  CBC Recent Labs  Lab 06/14/18 1408 06/15/18 0512  WBC 21.2* 12.3*  HGB 11.5* 11.2*  HCT 35.2* 35.3*  PLT 404* 333  MCV 79.6* 81.5  MCH 26.0 25.9*  MCHC 32.7 31.7  RDW 13.9 13.8  LYMPHSABS 1.2  --   MONOABS 1.2*  --   EOSABS 0.0  --   BASOSABS 0.1  --    ------------------------------------------------------------------------------------------------------------------  Chemistries  Recent Labs  Lab 06/14/18 1408 06/15/18 0512  NA 129* 134*  K 4.5 4.0  CL 90* 99  CO2 24 26  GLUCOSE 515* 262*  BUN 20 20  CREATININE 2.14* 1.73*  CALCIUM 8.7* 8.6*  AST 17  --   ALT 9  --   ALKPHOS 160*  --   BILITOT 0.8  --    --------------------------------------------------------------------------------------  Assessment & Plan: MRI on both feet indicated multiple areas where there may be osteomyelitis and it is hard to determine if the bases of all the metatarsals have osteomyelitis or not.  The most significant infection areas are as described above in my findings at the fifth metatarsal bases bilaterally and also the third metatarsal head on the right foot.  The other significant problem is the severely tight Achilles tendons which caused  him to be increased amount of stress on the lateral parts of his feet. His white count is come down considerably since been on the IV antibiotics I sat down for an extended period of time and talked with Xue today about his different options.  Basically I can try to go ahead and remove as much of the abnormal bone with the fifth metatarsal fourth metatarsal junctions and see if that will help alleviate some of the stress and pressure across the regions.  Also would recommend removing the third metatarsal head on the right.  Fifth metatarsal bases need to be removed bilaterally.  Hopefully I can leave some of the tuberosity be able to maintain attachment of the peroneal tendons.  This is questionable however. In addition I think it will be helpful for his long-term ability to walk if I lengthen both Achilles tendons.  I can do that through a percutaneous method and try to get better dorsiflexor he capacity which would help relieve some of the stress and pressure across that area laterally on both feet.  He wants to go and pursue this.  Also suggested that he start thinking strongly about the positive things if he does go with a below-knee amputation.  I explained that even no he would have to wear prosthetics he would not have to worry about the chronic recurrent ulcerations and infection in both feet which is still good to be a potential problem even after any surgery that I do.  He understands that he may ultimately end up with bilateral below-knee amputations.  Active Problems:   Sepsis [facility])   Family Communication: Plan discussed with patient   Albertine Patricia M.D on 06/15/2018 at 2:50 PM  Thank you for the consult, we will follow the patient with you  in the Hospital.

## 2018-06-15 NOTE — Consult Note (Signed)
Infectious Disease     Reason for Consult: MRSA bacteremia    Referring Physician: Earleen Newport Date of Admission:  06/14/2018   Active Problems:   Sepsis (Shawn [name])   HPI: Shawn [name] is a 32 y.o. male with hx IDDM (C/b neuropathy and nephropathy, prior DM foot infection now with 1 week of bilateral foot pain and fevers. In ED with wbc 21, low grade temp. Started vanco and unasyn. BCX + MRSA.  MRI R foot with osteomyelitis of MT and cunieforms, MRI L also with osteomyeltiis and cellulitis  Past Medical History:  Diagnosis Date  . CKD (chronic kidney disease)   . Diabetes mellitus without complication ([place])   . GERD (gastroesophageal reflux disease)   . IBS (irritable bowel syndrome)   . Osteomyelitis [facility])    Past Surgical History:  Procedure Laterality Date  . ABDOMINAL AORTOGRAM W/LOWER EXTREMITY Right 12/23/2017   Procedure: ABDOMINAL AORTOGRAM W/LOWER EXTREMITY;  Surgeon: Katha Cabal, MD;  Location: [facility];  Service: Cardiovascular;  Laterality: Right;  . AMPUTATION Right 12/24/2017   Procedure: AMPUTATION RAY;  Surgeon: Sharlotte Alamo, DPM;  Location: [facility];  Service: Podiatry;  Laterality: Right;  . AMPUTATION Left 04/06/2018   Procedure: AMPUTATION RAY;  Surgeon: Sharlotte Alamo, DPM;  Location: [facility];  Service: Podiatry;  Laterality: Left;  . AMPUTATION Left 04/09/2018   Procedure: AMPUTATION RAY;  Surgeon: Sharlotte Alamo, DPM;  Location: [facility];  Service: Podiatry;  Laterality: Left;  . APPLICATION OF WOUND VAC Right 12/24/2017   Procedure: APPLICATION OF WOUND VAC;  Surgeon: Sharlotte Alamo, DPM;  Location: [facility];  Service: Podiatry;  Laterality: Right;  . APPLICATION OF WOUND VAC Right 01/01/2018   Procedure: APPLICATION OF WOUND VAC;  Surgeon: Albertine Patricia, DPM;  Location: [facility];  Service: Podiatry;  Laterality: Right;  . IRRIGATION AND DEBRIDEMENT FOOT Right 12/20/2017   Procedure: IRRIGATION AND DEBRIDEMENT FOOT;  Surgeon: Sharlotte Alamo, DPM;  Location:  [facility];  Service: Podiatry;  Laterality: Right;  . IRRIGATION AND DEBRIDEMENT FOOT Right 12/24/2017   Procedure: IRRIGATION AND DEBRIDEMENT FOOT;  Surgeon: Sharlotte Alamo, DPM;  Location: [facility];  Service: Podiatry;  Laterality: Right;  . IRRIGATION AND DEBRIDEMENT FOOT Right 01/01/2018   Procedure: IRRIGATION AND DEBRIDEMENT FOOT-SKIN,SOFT TISSUE AND BONE;  Surgeon: Albertine Patricia, DPM;  Location: [facility];  Service: Podiatry;  Laterality: Right;  . IRRIGATION AND DEBRIDEMENT FOOT Left 04/06/2018   Procedure: IRRIGATION AND DEBRIDEMENT FOOT;  Surgeon: Sharlotte Alamo, DPM;  Location: [facility];  Service: Podiatry;  Laterality: Left;  . LOWER EXTREMITY ANGIOGRAPHY Left 04/06/2018   Procedure: Lower Extremity Angiography;  Surgeon: Algernon Huxley, MD;  Location: [facility];  Service: Cardiovascular;  Laterality: Left;   Social History   Tobacco Use  . Smoking status: Never Smoker  . Smokeless tobacco: Never Used  Substance Use Topics  . Alcohol use: No    Frequency: Never  . Drug use: Yes    Types: Marijuana, PCP   Family History  Problem Relation Age of Onset  . Diabetes Mother   . Ovarian cancer Mother   . Healthy Father     Allergies:  Allergies  Allergen Reactions  . Banana Hives, Nausea And Vomiting and Rash  . Keflex [Cephalexin] Rash    No swelling- also taken penicillin without any issue.  . Onion Hives, Nausea And Vomiting and Rash  . Sulfa Antibiotics Anaphylaxis  . Grapeseed Extract [Nutritional Supplements] Itching  . Shellfish Allergy Hives    "  ALL SEAFOOD"    Current antibiotics: Antibiotics Given (last 72 hours)    Date/Time Action Medication Dose Rate   06/14/18 1720 New Bag/Given   aztreonam (AZACTAM) 2 g in sodium chloride 0.9 % 100 mL IVPB 2 g 200 mL/hr   06/14/18 1755 New Bag/Given   vancomycin (VANCOCIN) IVPB 1000 mg/200 mL premix 1,000 mg 200 mL/hr   06/14/18 2324 New Bag/Given   Ampicillin-Sulbactam (UNASYN) 3 g in sodium chloride 0.9 % 100 mL  IVPB 3 g 200 mL/hr   06/15/18 0512 New Bag/Given   Ampicillin-Sulbactam (UNASYN) 3 g in sodium chloride 0.9 % 100 mL IVPB 3 g 200 mL/hr   06/15/18 0909 New Bag/Given   vancomycin (VANCOCIN) IVPB 750 mg/150 ml premix 750 mg 150 mL/hr      MEDICATIONS: . ferrous sulfate  325 mg Oral BID WC  . FLUoxetine  20 mg Oral QHS  . insulin aspart  0-5 Units Subcutaneous QHS  . insulin aspart  0-9 Units Subcutaneous TID WC  . insulin aspart  4 Units Subcutaneous TID WC  . insulin glargine  20 Units Subcutaneous QHS    Review of Systems - 11 systems reviewed and negative per HPI   OBJECTIVE: Temp:  [98.3 F (36.8 C)-100.6 F (38.1 C)] 100.6 F (38.1 C) (12/12 0845) Pulse Rate:  [107-138] 107 (12/12 0634) Resp:  [12-18] 16 (12/12 0634) BP: (91-136)/(69-98) 129/91 (12/12 0634) SpO2:  [97 %-99 %] 99 % (12/12 0634) Weight:  [52.2 kg] 52.2 kg (12/11 1345) Physical Exam  Constitutional: Thin, chronically ill appearing HENT: anicteric Mouth/Throat: Oropharynx is clear and moist. No oropharyngeal exudate.  Cardiovascular: Normal rate, regular rhythm and normal heart sounds. Pulmonary/Chest: Effort normal and breath sounds normal. No respiratory distress. He has no wheezes.  Abdominal: Soft. Bowel sounds are normal. He exhibits no distension. There is no tenderness.  Lymphadenopathy: He has no cervical adenopathy.  Neurological: He is alert and oriented to person, place, and time.  Skin: L foot with lateral wound but no exposed bone, some serous drainage R foot with lateral wound, and heaped up bunion with some necrotic tissue Psychiatric: He has a normal mood and affect. His behavior is normal.     LABS: Results for orders placed or performed during the hospital encounter of 06/14/18 (from the past 48 hour(s))  CG4 I-STAT (Lactic acid)     Status: Abnormal   Collection Time: 06/14/18  2:00 PM  Result Value Ref Range   Lactic Acid, Venous 1.91 (H) 0.5 - 1.9 mmol/L  Comprehensive metabolic  panel     Status: Abnormal   Collection Time: 06/14/18  2:08 PM  Result Value Ref Range   Sodium 129 (L) 135 - 145 mmol/L   Potassium 4.5 3.5 - 5.1 mmol/L   Chloride 90 (L) 98 - 111 mmol/L   CO2 24 22 - 32 mmol/L   Glucose, Bld 515 (HH) 70 - 99 mg/dL    Comment: CRITICAL RESULT CALLED TO, READ BACK BY AND VERIFIED WITH TIFFANY JOHNSON 06/14/18 1448 KLW    BUN 20 6 - 20 mg/dL   Creatinine, Ser 2.14 (H) 0.61 - 1.24 mg/dL   Calcium 8.7 (L) 8.9 - 10.3 mg/dL   Total Protein 6.8 6.5 - 8.1 g/dL   Albumin 2.4 (L) 3.5 - 5.0 g/dL   AST 17 15 - 41 U/L   ALT 9 0 - 44 U/L   Alkaline Phosphatase 160 (H) 38 - 126 U/L   Total Bilirubin 0.8 0.3 - 1.2 mg/dL  GFR calc non Af Amer 40 (L) >60 mL/min   GFR calc Af Amer 46 (L) >60 mL/min   Anion gap 15 5 - 15    Comment: Performed at [facility] [address] [place], [place] [address]  CBC with Differential     Status: Abnormal   Collection Time: 06/14/18  2:08 PM  Result Value Ref Range   WBC 21.2 (H) 4.0 - 10.5 K/uL   RBC 4.42 4.22 - 5.81 MIL/uL   Hemoglobin 11.5 (L) 13.0 - 17.0 g/dL   HCT 35.2 (L) 39.0 - 52.0 %   MCV 79.6 (L) 80.0 - 100.0 fL   MCH 26.0 26.0 - 34.0 pg   MCHC 32.7 30.0 - 36.0 g/dL   RDW 13.9 11.5 - 15.5 %   Platelets 404 (H) 150 - 400 K/uL   nRBC 0.0 0.0 - 0.2 %   Neutrophils Relative % 88 %   Neutro Abs 18.6 (H) 1.7 - 7.7 K/uL   Lymphocytes Relative 6 %   Lymphs Abs 1.2 0.7 - 4.0 K/uL   Monocytes Relative 5 %   Monocytes Absolute 1.2 (H) 0.1 - 1.0 K/uL   Eosinophils Relative 0 %   Eosinophils Absolute 0.0 0.0 - 0.5 K/uL   Basophils Relative 0 %   Basophils Absolute 0.1 0.0 - 0.1 K/uL   Immature Granulocytes 1 %   Abs Immature Granulocytes 0.15 (H) 0.00 - 0.07 K/uL    Comment: Performed at [facility], [address] [place], [place] [address]  Sedimentation rate     Status: Abnormal   Collection Time: 06/14/18  2:08 PM  Result Value Ref Range   Sed Rate 32 (H) 0 - 15 mm/hr    Comment:  Performed at [facility], [address] [place], [place] [address]  Hemoglobin A1c     Status: Abnormal   Collection Time: 06/14/18  2:08 PM  Result Value Ref Range   Hgb A1c MFr Bld 10.8 (H) 4.8 - 5.6 %    Comment: (NOTE) Pre diabetes:          5.7%-6.4% Diabetes:              >6.4% Glycemic control for   <7.0% adults with diabetes    Mean Plasma Glucose 263.26 mg/dL    Comment: Performed at [facility], [address], [place], [place] [address]  Blood culture (single)     Status: None (Preliminary result)   Collection Time: 06/14/18  2:09 PM  Result Value Ref Range   Specimen Description BLOOD RIGHT ANTECUBITAL    Special Requests      BOTTLES DRAWN AEROBIC AND ANAEROBIC Blood Culture adequate volume   Culture  Setup Time      Organism ID to follow GRAM POSITIVE COCCI AEROBIC BOTTLE ONLY CRITICAL RESULT CALLED TO, READ BACK BY AND VERIFIED WITH:  [facility] SLAUGHTER AT 1030 06/15/18 SDR Performed at [facility], [address] [place], [place] [address]    Culture GRAM POSITIVE COCCI    Report Status PENDING   Blood Culture ID Panel (Reflexed)     Status: Abnormal   Collection Time: 06/14/18  2:09 PM  Result Value Ref Range   Enterococcus species NOT DETECTED NOT DETECTED   Listeria monocytogenes NOT DETECTED NOT DETECTED   Staphylococcus species DETECTED (A) NOT DETECTED    Comment: CRITICAL RESULT CALLED TO, READ BACK BY AND VERIFIED WITH:  [facility] SLAUGHTER AT 1030 06/15/18 SDR    Staphylococcus  aureus (BCID) DETECTED (A) NOT DETECTED    Comment: Methicillin (oxacillin)-resistant Staphylococcus aureus (MRSA). MRSA is predictably resistant to beta-lactam antibiotics (except ceftaroline). Preferred therapy is vancomycin unless clinically contraindicated. Patient requires contact precautions if  hospitalized. CRITICAL RESULT CALLED TO, READ BACK BY AND VERIFIED WITH:  [facility] SLAUGHTER AT 1030 06/15/18 SDR    Methicillin resistance DETECTED (A) NOT  DETECTED    Comment: CRITICAL RESULT CALLED TO, READ BACK BY AND VERIFIED WITH:  MYRA SLAUGHTER AT 1030 06/15/18 SDR    Streptococcus species NOT DETECTED NOT DETECTED   Streptococcus agalactiae NOT DETECTED NOT DETECTED   Streptococcus pneumoniae NOT DETECTED NOT DETECTED   Streptococcus pyogenes NOT DETECTED NOT DETECTED   Acinetobacter baumannii NOT DETECTED NOT DETECTED   Enterobacteriaceae species NOT DETECTED NOT DETECTED   Enterobacter cloacae complex NOT DETECTED NOT DETECTED   Escherichia coli NOT DETECTED NOT DETECTED   Klebsiella oxytoca NOT DETECTED NOT DETECTED   Klebsiella pneumoniae NOT DETECTED NOT DETECTED   Proteus species NOT DETECTED NOT DETECTED   Serratia marcescens NOT DETECTED NOT DETECTED   Haemophilus influenzae NOT DETECTED NOT DETECTED   Neisseria meningitidis NOT DETECTED NOT DETECTED   Pseudomonas aeruginosa NOT DETECTED NOT DETECTED   Candida albicans NOT DETECTED NOT DETECTED   Candida glabrata NOT DETECTED NOT DETECTED   Candida krusei NOT DETECTED NOT DETECTED   Candida parapsilosis NOT DETECTED NOT DETECTED   Candida tropicalis NOT DETECTED NOT DETECTED    Comment: Performed at [facility], [address] [place], [place] [address]  Blood Culture (routine x 2)     Status: None (Preliminary result)   Collection Time: 06/14/18  5:01 PM  Result Value Ref Range   Specimen Description BLOOD BLOOD LEFT HAND    Special Requests      BOTTLES DRAWN AEROBIC AND ANAEROBIC Blood Culture results may not be optimal due to an inadequate volume of blood received in culture bottles   Culture      NO GROWTH < 24 HOURS Performed at [facility], [address] [place], [place] [address]    Report Status PENDING   Blood Culture (routine x 2)     Status: None (Preliminary result)   Collection Time: 06/14/18  5:01 PM  Result Value Ref Range   Specimen Description BLOOD BLOOD RIGHT HAND    Special Requests      BOTTLES DRAWN AEROBIC AND  ANAEROBIC Blood Culture results may not be optimal due to an inadequate volume of blood received in culture bottles   Culture      NO GROWTH < 24 HOURS Performed at [facility], [address] [place], [place] [address]    Report Status PENDING   Glucose, capillary     Status: Abnormal   Collection Time: 06/14/18  5:15 PM  Result Value Ref Range   Glucose-Capillary 472 (H) 70 - 99 mg/dL  Lactic acid, plasma     Status: None   Collection Time: 06/14/18  6:00 PM  Result Value Ref Range   Lactic Acid, Venous 1.1 0.5 - 1.9 mmol/L    Comment: Performed at [facility], [address] [place],  [address]  Glucose, capillary     Status: Abnormal   Collection Time: 06/14/18  6:24 PM  Result Value Ref Range   Glucose-Capillary 444 (H) 70 - 99 mg/dL   Comment 1 Notify RN   Glucose, capillary     Status: Abnormal   Collection Time: 06/14/18  7:20 PM  Result Value Ref Range   Glucose-Capillary 450 (H) 70 - 99 mg/dL  Urinalysis, Complete w Microscopic     Status: Abnormal   Collection Time: 06/14/18  7:45 PM  Result Value Ref Range   Color, Urine YELLOW (A) YELLOW   APPearance HAZY (A) CLEAR   Specific Gravity, Urine 1.020 1.005 - 1.030   pH 5.0 5.0 - 8.0   Glucose, UA >=500 (A) NEGATIVE mg/dL   Hgb urine dipstick MODERATE (A) NEGATIVE   Bilirubin Urine NEGATIVE NEGATIVE   Ketones, ur 5 (A) NEGATIVE mg/dL   Protein, ur >=300 (A) NEGATIVE mg/dL   Nitrite NEGATIVE NEGATIVE   Leukocytes, UA NEGATIVE NEGATIVE   RBC / HPF 0-5 0 - 5 RBC/hpf   WBC, UA 0-5 0 - 5 WBC/hpf   Bacteria, UA NONE SEEN NONE SEEN   Squamous Epithelial / LPF 0-5 0 - 5   Mucus PRESENT    Hyaline Casts, UA PRESENT    Granular Casts, UA PRESENT     Comment: Performed at [facility], [address], [place], [place] [address]  Urine Drug Screen, Qualitative ([facility] only)     Status: Abnormal   Collection Time: 06/14/18  7:45 PM  Result Value Ref Range   Tricyclic, Ur Screen NONE  DETECTED NONE DETECTED   Amphetamines, Ur Screen NONE DETECTED NONE DETECTED   MDMA (Ecstasy)Ur Screen NONE DETECTED NONE DETECTED   Cocaine Metabolite,Ur [place] NONE DETECTED NONE DETECTED   Opiate, Ur Screen NONE DETECTED NONE DETECTED   Phencyclidine (PCP) Ur S NONE DETECTED NONE DETECTED   Cannabinoid 50 Ng, Ur [place] POSITIVE (A) NONE DETECTED   Barbiturates, Ur Screen NONE DETECTED NONE DETECTED   Benzodiazepine, Ur Scrn NONE DETECTED NONE DETECTED   Methadone Scn, Ur NONE DETECTED NONE DETECTED    Comment: (NOTE) Tricyclics + metabolites, urine    Cutoff 1000 ng/mL Amphetamines + metabolites, urine  Cutoff 1000 ng/mL MDMA (Ecstasy), urine              Cutoff 500 ng/mL Cocaine Metabolite, urine          Cutoff 300 ng/mL Opiate + metabolites, urine        Cutoff 300 ng/mL Phencyclidine (PCP), urine         Cutoff 25 ng/mL Cannabinoid, urine                 Cutoff 50 ng/mL Barbiturates + metabolites, urine  Cutoff 200 ng/mL Benzodiazepine, urine              Cutoff 200 ng/mL Methadone, urine                   Cutoff 300 ng/mL The urine drug screen provides only a preliminary, unconfirmed analytical test result and should not be used for non-medical purposes. Clinical consideration and professional judgment should be applied to any positive drug screen result due to possible interfering substances. A more specific alternate chemical method must be used in order to obtain a confirmed analytical result. Gas chromatography / mass spectrometry (GC/MS) is the preferred confirmat ory method. Performed at [facility], [address] [place], [place] [address]   C-reactive protein     Status: Abnormal   Collection Time: 06/14/18  8:22 PM  Result Value Ref Range   CRP 17.4 (H) <1.0 mg/dL    Comment: Performed at [facility], [address], [place], [place] [address]  Glucose, capillary     Status: Abnormal  Collection Time: 06/14/18  8:32 PM  Result Value Ref Range    Glucose-Capillary 421 (H) 70 - 99 mg/dL  Glucose, capillary     Status: Abnormal   Collection Time: 06/15/18  3:50 AM  Result Value Ref Range   Glucose-Capillary 245 (H) 70 - 99 mg/dL  Basic metabolic panel     Status: Abnormal   Collection Time: 06/15/18  5:12 AM  Result Value Ref Range   Sodium 134 (L) 135 - 145 mmol/L   Potassium 4.0 3.5 - 5.1 mmol/L   Chloride 99 98 - 111 mmol/L   CO2 26 22 - 32 mmol/L   Glucose, Bld 262 (H) 70 - 99 mg/dL   BUN 20 6 - 20 mg/dL   Creatinine, Ser 1.73 (H) 0.61 - 1.24 mg/dL   Calcium 8.6 (L) 8.9 - 10.3 mg/dL   GFR calc non Af Amer 52 (L) >60 mL/min   GFR calc Af Amer >60 >60 mL/min   Anion gap 9 5 - 15    Comment: Performed at [facility], [address] [place], [place] [address]  CBC     Status: Abnormal   Collection Time: 06/15/18  5:12 AM  Result Value Ref Range   WBC 12.3 (H) 4.0 - 10.5 K/uL   RBC 4.33 4.22 - 5.81 MIL/uL   Hemoglobin 11.2 (L) 13.0 - 17.0 g/dL   HCT 35.3 (L) 39.0 - 52.0 %   MCV 81.5 80.0 - 100.0 fL   MCH 25.9 (L) 26.0 - 34.0 pg   MCHC 31.7 30.0 - 36.0 g/dL   RDW 13.8 11.5 - 15.5 %   Platelets 333 150 - 400 K/uL   nRBC 0.0 0.0 - 0.2 %    Comment: Performed at [facility], [address] [place], [place] [address]  Glucose, capillary     Status: Abnormal   Collection Time: 06/15/18  7:31 AM  Result Value Ref Range   Glucose-Capillary 237 (H) 70 - 99 mg/dL   No components found for: ESR, C REACTIVE PROTEIN MICRO: Recent Results (from the past 720 hour(s))  Blood culture (single)     Status: None (Preliminary result)   Collection Time: 06/14/18  2:09 PM  Result Value Ref Range Status   Specimen Description BLOOD RIGHT ANTECUBITAL  Final   Special Requests   Final    BOTTLES DRAWN AEROBIC AND ANAEROBIC Blood Culture adequate volume   Culture  Setup Time   Final    Organism ID to follow [place] CRITICAL RESULT CALLED TO, READ BACK BY AND VERIFIED WITH:  [facility]  SLAUGHTER AT 1030 06/15/18 SDR Performed at [facility], [address] [place], [place] [address]    Culture GRAM POSITIVE COCCI  Final   Report Status PENDING  Incomplete  Blood Culture ID Panel (Reflexed)     Status: Abnormal   Collection Time: 06/14/18  2:09 PM  Result Value Ref Range Status   Enterococcus species NOT DETECTED NOT DETECTED Final   Listeria monocytogenes NOT DETECTED NOT DETECTED Final   Staphylococcus species DETECTED (A) NOT DETECTED Final    Comment: CRITICAL RESULT CALLED TO, READ BACK BY AND VERIFIED WITH:  MYRA SLAUGHTER AT 1030 06/15/18 SDR    Staphylococcus aureus (BCID) DETECTED (A) NOT DETECTED Final    Comment: Methicillin (oxacillin)-resistant Staphylococcus aureus (MRSA). MRSA is predictably resistant to beta-lactam antibiotics (except ceftaroline). Preferred therapy is vancomycin unless clinically contraindicated. Patient requires contact precautions if  hospitalized. CRITICAL RESULT  CALLED TO, READ BACK BY AND VERIFIED WITH:  MYRA SLAUGHTER AT 1030 06/15/18 SDR    Methicillin resistance DETECTED (A) NOT DETECTED Final    Comment: CRITICAL RESULT CALLED TO, READ BACK BY AND VERIFIED WITH:  MYRA SLAUGHTER AT 1030 06/15/18 SDR    Streptococcus species NOT DETECTED NOT DETECTED Final   Streptococcus agalactiae NOT DETECTED NOT DETECTED Final   Streptococcus pneumoniae NOT DETECTED NOT DETECTED Final   Streptococcus pyogenes NOT DETECTED NOT DETECTED Final   Acinetobacter baumannii NOT DETECTED NOT DETECTED Final   Enterobacteriaceae species NOT DETECTED NOT DETECTED Final   Enterobacter cloacae complex NOT DETECTED NOT DETECTED Final   Escherichia coli NOT DETECTED NOT DETECTED Final   Klebsiella oxytoca NOT DETECTED NOT DETECTED Final   Klebsiella pneumoniae NOT DETECTED NOT DETECTED Final   Proteus species NOT DETECTED NOT DETECTED Final   Serratia marcescens NOT DETECTED NOT DETECTED Final   Haemophilus influenzae NOT DETECTED NOT  DETECTED Final   Neisseria meningitidis NOT DETECTED NOT DETECTED Final   Pseudomonas aeruginosa NOT DETECTED NOT DETECTED Final   Candida albicans NOT DETECTED NOT DETECTED Final   Candida glabrata NOT DETECTED NOT DETECTED Final   Candida krusei NOT DETECTED NOT DETECTED Final   Candida parapsilosis NOT DETECTED NOT DETECTED Final   Candida tropicalis NOT DETECTED NOT DETECTED Final    Comment: Performed at [facility], [address] [place], [place] [address]  Blood Culture (routine x 2)     Status: None (Preliminary result)   Collection Time: 06/14/18  5:01 PM  Result Value Ref Range Status   Specimen Description BLOOD BLOOD LEFT HAND  Final   Special Requests   Final    BOTTLES DRAWN AEROBIC AND ANAEROBIC Blood Culture results may not be optimal due to an inadequate volume of blood received in culture bottles   Culture   Final    NO GROWTH < 24 HOURS Performed at [facility], [address] [place], [place] [address]    Report Status PENDING  Incomplete  Blood Culture (routine x 2)     Status: None (Preliminary result)   Collection Time: 06/14/18  5:01 PM  Result Value Ref Range Status   Specimen Description BLOOD BLOOD RIGHT HAND  Final   Special Requests   Final    BOTTLES DRAWN AEROBIC AND ANAEROBIC Blood Culture results may not be optimal due to an inadequate volume of blood received in culture bottles   Culture   Final    NO GROWTH < 24 HOURS Performed at [facility], [address] [place], [place] [address]    Report Status PENDING  Incomplete    IMAGING: Dg Chest 2 View  Result Date: 06/14/2018 CLINICAL DATA:  Tachycardia and hypertension EXAM: CHEST - 2 VIEW COMPARISON:  May 28, 2018 FINDINGS: No edema or consolidation. Heart size and pulmonary vascularity are normal. No adenopathy. No bone lesions. IMPRESSION: No edema or consolidation. Electronically Signed   By: Lowella Grip III M.D.   On: 06/14/2018 14:43   Mr  Foot Right Wo Contrast  Result Date: 06/15/2018 CLINICAL DATA:  Uncontrolled diabetes. History of prior amputations. EXAM: MRI OF THE RIGHT FOREFOOT WITHOUT CONTRAST TECHNIQUE: Multiplanar, multisequence MR imaging of the right foot was performed. No intravenous contrast was administered. COMPARISON:  MRI 01/11/2018 FINDINGS: Prior amputations involving the fourth and fifth metatarsals with expected postoperative changes. I do not see any definite MR findings for recurrent osteomyelitis. There is diffuse signal abnormality in the  second metatarsal with low T1 and high T2 signal intensity and significant surrounding edema consistent with osteomyelitis. Similar but less significant changes involving the base of the third metatarsal and the middle and lateral cuneiforms. Diffuse cellulitis and myofasciitis without definite findings for pyomyositis. IMPRESSION: 1. Prior amputations involving the fourth and fifth metatarsals without definite findings for recurrent osteomyelitis. 2. Changes consistent with osteomyelitis involving the second and third metatarsals along with the middle and lateral cuneiforms. 3. Diffuse cellulitis and myofasciitis. Electronically Signed   By: Marijo Sanes M.D.   On: 06/15/2018 08:12   Mr Foot Left Wo Contrast  Result Date: 06/15/2018 CLINICAL DATA:  Uncontrolled diabetes with diabetic foot ulcers. History of prior amputations. EXAM: MRI OF THE LEFT FOOT WITHOUT CONTRAST TECHNIQUE: Multiplanar, multisequence MR imaging of the left foot was performed. No intravenous contrast was administered. COMPARISON:  Left foot radiographs 04/05/2018 FINDINGS: Evidence of prior amputations involving the fourth and fifth metatarsals. There are open wounds along the lateral aspect of the foot near the base of the fourth and fifth metatarsals with gas/fluid/abscess extending down to the bones. There is osteomyelitis involving the bases of the amputated fourth and fifth metatarsals. There is also  diffuse osteomyelitis involving the third metatarsal along with a pathologic fracture of the third metatarsal neck. Osteomyelitis involving the second metatarsal also with possible pathologic fracture involving the metatarsal head and neck. There is osteomyelitis involving the cuneiforms and cuboid. I do not see any involvement of the navicular bone, talus or calcaneus for certain. Diffuse cellulitis and myofasciitis without obvious changes of pyomyositis. Small tibiotalar and subtalar joint effusions. There is also some fluid and edema in the sinus tarsi. IMPRESSION: 1. Extensive osteomyelitis involving the midfoot and forefoot. Osteomyelitis involves the bases of the amputated fourth and fifth metatarsals, the second and third metatarsals, the 3 cuneiform bones and the cuboid. 2. Open wound along the lateral aspect of the foot with diffuse cellulitis and myofasciitis. Electronically Signed   By: Marijo Sanes M.D.   On: 06/15/2018 08:05   Dg Chest Port 1 View  Result Date: 05/28/2018 CLINICAL DATA:  Tachycardia EXAM: PORTABLE CHEST 1 VIEW COMPARISON:  07/13/2017 FINDINGS: The heart size and mediastinal contours are within normal limits. Both lungs are clear. The visualized skeletal structures are unremarkable. IMPRESSION: No active disease. Electronically Signed   By: Lucienne Capers M.D.   On: 05/28/2018 03:54    Assessment:   Shawn [name] is a 32 y.o. 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  He was treated with IV unasyn from 10/2- 11//15/19 for L foot infection. Then received 2 weeks PO augmentin. The infections have certainly progressed since that time and now have identified MRSA.  Recommendations MRSA bacteremia - Continue vancomycin  Repeat bcx - ordered Check echo - ordered  DM foot infection and osteomyelitis- bilateral. Consult podiatry.  Change  unasyn to zosyn  for pseudomonal coverage and anaerobic coverage.  Thank you very much for allowing me to participate in the care of this patient. Please call with questions.   Cheral Marker. Ola Spurr, MD

## 2018-06-15 NOTE — Consult Note (Signed)
Pharmacy Antibiotic Note  Shawn [name] is a 32 y.o. male admitted on 06/14/2018 with cellulitis.  Pharmacy has been consulted for Zosyn and Vancomycin. dosing. Since admission his blood has grown out MRSA.  His SCr has improved but not to his usual baseline of 1.5 and does not require a change in dosage at this time. The patient was started on Unasyn that is being changed to Zosyn by ID for anaerobe coverage.  Plan: 1) Begin Zosyn EI IV every 8 hours  2) continue vancomycin to 750mg  every 18 hours   Ke=0.042  Vd = 36.75 L   t 1/2 16h  Css 38.9/18.8 mcg/mL  Goal Vt 15-20 mcg/mL  Vt prior to 4th dose  Height: 5\' 6"  (167.6 cm) Weight: 115 lb (52.2 kg) IBW/kg (Calculated) : 63.8  Temp (24hrs), Avg:99 F (37.2 C), Min:98.3 F (36.8 C), Max:100.6 F (38.1 C)  Recent Labs  Lab 06/14/18 1400 06/14/18 1408 06/14/18 1800 06/15/18 0512  WBC  --  21.2*  --  12.3*  CREATININE  --  2.14*  --  1.73*  LATICACIDVEN 1.91*  --  1.1  --     Estimated Creatinine Clearance: 46.1 mL/min (A) (by C-G formula based on SCr of 1.73 mg/dL (H)).    Allergies  Allergen Reactions  . Banana Hives, Nausea And Vomiting and Rash  . Keflex [Cephalexin] Rash    No swelling- also taken penicillin without any issue.  . Onion Hives, Nausea And Vomiting and Rash  . Sulfa Antibiotics Anaphylaxis  . Grapeseed Extract [Nutritional Supplements] Itching  . Shellfish Allergy Hives    "ALL SEAFOOD"    Antimicrobials this admission: Vancomycin 12/11 >>  Zosyn 12/12 >> Unasyn 12/11 >>12/12 Aztreonam 12/11 x1  Dose adjustments this admission: Vancomycin 750mg  q18h >>> N/A  Microbiology results: 12/11 BCx: MRSA  Thank you for allowing pharmacy to be a part of this patient's care.  Dallie Piles, PharmD Clinical Pharmacist 06/15/2018 10:54 AM

## 2018-06-15 NOTE — Progress Notes (Addendum)
[place] [place] NAME: Ronnie Doo    MR#:  [id number]  DATE OF BIRTH:  [birth year]  SUBJECTIVE: Complains of nausea, vomiting, states [employer] works for him.  Lateral feet pain, noted to have osteomyelitis of both feet.  CHIEF COMPLAINT:   Chief Complaint  Patient presents with  . Tachycardia  . Nausea  . Foot Pain  . Emesis    REVIEW OF SYSTEMS:   ROS CONSTITUTIONAL: Low-grade temperature EYES: No blurred or double vision.  EARS, NOSE, AND THROAT: No tinnitus or ear pain.  RESPIRATORY: No cough, shortness of breath, wheezing or hemoptysis.  CARDIOVASCULAR: No chest pain, orthopnea, edema.  GASTROINTESTINAL: Nausea, vomiting.Marland Kitchen  GENITOURINARY: No dysuria, hematuria.  ENDOCRINE: No polyuria, nocturia,  HEMATOLOGY: No anemia, easy bruising or bleeding SKIN: Dressing present to the both legs.  MUSCULOSKELETAL pain in both feet NEUROLOGIC: No tingling, numbness, weakness.  PSYCHIATRY: No anxiety or depression.   DRUG ALLERGIES:   Allergies  Allergen Reactions  . Banana Hives, Nausea And Vomiting and Rash  . Keflex [Cephalexin] Rash    No swelling- also taken penicillin without any issue.  . Onion Hives, Nausea And Vomiting and Rash  . Sulfa Antibiotics Anaphylaxis  . Grapeseed Extract [Nutritional Supplements] Itching  . Shellfish Allergy Hives    "ALL SEAFOOD"    VITALS:  Blood pressure (!) 129/91, pulse (!) 107, temperature (!) 100.6 F (38.1 C), temperature source Oral, resp. rate 16, height 5\' 6"  (1.676 m), weight 52.2 kg, SpO2 99 %.  PHYSICAL EXAMINATION:  GENERAL:  32 y.o.-year-old patient lying in the bed with no acute distress.  Patient , appears to be uncomfortable because of nausea and vomiting. EYES: Pupils equal, round, reactive to light. No scleral icterus. Extraocular muscles intact.  HEENT: Head atraumatic, normocephalic. Oropharynx and nasopharynx clear.  NECK:  Supple, no jugular venous  distention. No thyroid enlargement, no tenderness.  LUNGS: Normal breath sounds bilaterally, no wheezing, rales,rhonchi or crepitation. No use of accessory muscles of respiration.  CARDIOVASCULAR: S1, S2 normal. No murmurs, rubs, or gallops.  ABDOMEN: Soft, nontender, nondistended. Bowel sounds present. No organomegaly or mass.  EXTREMITIES: Patient has ulcers in both feet. NEUROLOGIC: Cranial nerves II through XII are intact. Muscle strength 5/5 in all extremities. Sensation intact. Gait not checked.  PSYCHIATRIC: The patient is alert and oriented x 3.  SKIN: Noted to have right foot, left foot ulcers patient usually follows up with Dr. Caryl Comes   LABORATORY PANEL:   CBC Recent Labs  Lab 06/15/18 0512  WBC 12.3*  HGB 11.2*  HCT 35.3*  PLT 333   ------------------------------------------------------------------------------------------------------------------  Chemistries  Recent Labs  Lab 06/14/18 1408 06/15/18 0512  NA 129* 134*  K 4.5 4.0  CL 90* 99  CO2 24 26  GLUCOSE 515* 262*  BUN 20 20  CREATININE 2.14* 1.73*  CALCIUM 8.7* 8.6*  AST 17  --   ALT 9  --   ALKPHOS 160*  --   BILITOT 0.8  --    ------------------------------------------------------------------------------------------------------------------  Cardiac Enzymes No results for input(s): TROPONINI in the last 168 hours. ------------------------------------------------------------------------------------------------------------------  RADIOLOGY:  Dg Chest 2 View  Result Date: 06/14/2018 CLINICAL DATA:  Tachycardia and hypertension EXAM: CHEST - 2 VIEW COMPARISON:  May 28, 2018 FINDINGS: No edema or consolidation. Heart size and pulmonary vascularity are normal. No adenopathy. No bone lesions. IMPRESSION: No edema or consolidation. Electronically Signed   By: Lowella Grip III M.D.   On: 06/14/2018 14:43  Mr Foot Right Wo Contrast  Result Date: 06/15/2018 CLINICAL DATA:  Uncontrolled diabetes.  History of prior amputations. EXAM: MRI OF THE RIGHT FOREFOOT WITHOUT CONTRAST TECHNIQUE: Multiplanar, multisequence MR imaging of the right foot was performed. No intravenous contrast was administered. COMPARISON:  MRI 01/11/2018 FINDINGS: Prior amputations involving the fourth and fifth metatarsals with expected postoperative changes. I do not see any definite MR findings for recurrent osteomyelitis. There is diffuse signal abnormality in the second metatarsal with low T1 and high T2 signal intensity and significant surrounding edema consistent with osteomyelitis. Similar but less significant changes involving the base of the third metatarsal and the middle and lateral cuneiforms. Diffuse cellulitis and myofasciitis without definite findings for pyomyositis. IMPRESSION: 1. Prior amputations involving the fourth and fifth metatarsals without definite findings for recurrent osteomyelitis. 2. Changes consistent with osteomyelitis involving the second and third metatarsals along with the middle and lateral cuneiforms. 3. Diffuse cellulitis and myofasciitis. Electronically Signed   By: Marijo Sanes M.D.   On: 06/15/2018 08:12   Mr Foot Left Wo Contrast  Result Date: 06/15/2018 CLINICAL DATA:  Uncontrolled diabetes with diabetic foot ulcers. History of prior amputations. EXAM: MRI OF THE LEFT FOOT WITHOUT CONTRAST TECHNIQUE: Multiplanar, multisequence MR imaging of the left foot was performed. No intravenous contrast was administered. COMPARISON:  Left foot radiographs 04/05/2018 FINDINGS: Evidence of prior amputations involving the fourth and fifth metatarsals. There are open wounds along the lateral aspect of the foot near the base of the fourth and fifth metatarsals with gas/fluid/abscess extending down to the bones. There is osteomyelitis involving the bases of the amputated fourth and fifth metatarsals. There is also diffuse osteomyelitis involving the third metatarsal along with a pathologic fracture of the  third metatarsal neck. Osteomyelitis involving the second metatarsal also with possible pathologic fracture involving the metatarsal head and neck. There is osteomyelitis involving the cuneiforms and cuboid. I do not see any involvement of the navicular bone, talus or calcaneus for certain. Diffuse cellulitis and myofasciitis without obvious changes of pyomyositis. Small tibiotalar and subtalar joint effusions. There is also some fluid and edema in the sinus tarsi. IMPRESSION: 1. Extensive osteomyelitis involving the midfoot and forefoot. Osteomyelitis involves the bases of the amputated fourth and fifth metatarsals, the second and third metatarsals, the 3 cuneiform bones and the cuboid. 2. Open wound along the lateral aspect of the foot with diffuse cellulitis and myofasciitis. Electronically Signed   By: Marijo Sanes M.D.   On: 06/15/2018 08:05    EKG:   Orders placed or performed during the hospital encounter of 06/14/18  . EKG 12-Lead  . EKG 12-Lead  . ED EKG  . ED EKG    ASSESSMENT AND PLAN:   32 year old male patient with history of hypertension, diabetes mellitus type 2,  Previous osteomyelitis of the left foot status post wound VAC, recently seen Dr. Caryl Comes on #26, debrided, devitalized tissue from the left foot ulcer and also right foot followed by dressing changes, sent because of bilateral foot pain, fever now noted to have osteomyelitis.  #1. acute osteomyelitis of mid, right forefoot on the left, osteomyelitis of second, third metatarsal right side, continue IV vancomycin, Azactam, podiatry consult with Dr. Sharlotte Alamo,, appreciate ID consult as well.  Continue aggressive IV antibiotics, #2. intractable nausea, vomiting likely secondary to sepsis, 3.  Acute renal failure due to sepsis: Continue IV fluids renal function better. 4.  Sepsis secondary to acute osteomyelitis of both feet, follow blood cultures, appreciate podiatry, ID  consult, continue aggressive IV antibiotics, patient  CRP is elevated, patient WBC better today. 5.  Diabetes mellitus type 2,: Hyperglycemia yesterday, better today controlled, continue present aggressive control of diabetes including Lantus 20 units daily at bedtime, NovoLog 4 units 3 times daily with meals along with bedtime coverage. Consult inpatient diabetes coordinator.  Hemoglobin A1c 10.8 indicating poor control of diabetes.  All the records are reviewed and case discussed with Care Management/Social Workerr. Management plans discussed with the patient, family and they are in agreement.  CODE STATUS: Full code  TOTAL TIME TAKING CARE OF THIS PATIENT: 40 minutes.  More than 50% time spent in counseling, coordination of care Unable to plan discharge position yet because of osteomyelitis and needs podiatry, ID recommendation for further plan   Epifanio Lesches M.D on 06/15/2018 at 8:59 AM  Between 7am to 6pm - Pager - [phone]  After 6pm go to [email or web address] - password [email or web address] [facility]  Office  [phone]  CC: Primary care physician; Valera Castle, MD   Note: This dictation was prepared with Dragon dictation along with smaller phrase technology. Any transcriptional errors that result from this process are unintentional.

## 2018-06-15 NOTE — Progress Notes (Signed)
*  PRELIMINARY RESULTS* Echocardiogram 2D Echocardiogram has been performed.  Shawn [name] 06/15/2018, 1:27 PM

## 2018-06-15 NOTE — Progress Notes (Signed)
MD made aware that pt.'s HR is sustaining in the 120-130s, pt also has a low grade fever of 100 orally, pt is shivering while lying in the bed. New orders placed by MD. RN will continue to monitor pt closely.  Shawn [name] [employer]

## 2018-06-15 NOTE — Progress Notes (Signed)
PHARMACY - PHYSICIAN COMMUNICATION CRITICAL VALUE ALERT - BLOOD CULTURE IDENTIFICATION (BCID)  Results for orders placed or performed during the hospital encounter of 06/14/18  Blood Culture ID Panel (Reflexed) (Collected: 06/14/2018  2:09 PM)  Result Value Ref Range   Enterococcus species NOT DETECTED NOT DETECTED   Listeria monocytogenes NOT DETECTED NOT DETECTED   Staphylococcus species DETECTED (A) NOT DETECTED   Staphylococcus aureus (BCID) DETECTED (A) NOT DETECTED   Methicillin resistance DETECTED (A) NOT DETECTED   Streptococcus species NOT DETECTED NOT DETECTED   Streptococcus agalactiae NOT DETECTED NOT DETECTED   Streptococcus pneumoniae NOT DETECTED NOT DETECTED   Streptococcus pyogenes NOT DETECTED NOT DETECTED   Acinetobacter baumannii NOT DETECTED NOT DETECTED   Enterobacteriaceae species NOT DETECTED NOT DETECTED   Enterobacter cloacae complex NOT DETECTED NOT DETECTED   Escherichia coli NOT DETECTED NOT DETECTED   Klebsiella oxytoca NOT DETECTED NOT DETECTED   Klebsiella pneumoniae NOT DETECTED NOT DETECTED   Proteus species NOT DETECTED NOT DETECTED   Serratia marcescens NOT DETECTED NOT DETECTED   Haemophilus influenzae NOT DETECTED NOT DETECTED   Neisseria meningitidis NOT DETECTED NOT DETECTED   Pseudomonas aeruginosa NOT DETECTED NOT DETECTED   Candida albicans NOT DETECTED NOT DETECTED   Candida glabrata NOT DETECTED NOT DETECTED   Candida krusei NOT DETECTED NOT DETECTED   Candida parapsilosis NOT DETECTED NOT DETECTED   Candida tropicalis NOT DETECTED NOT DETECTED    Name of physician (or Provider) Contacted: spoke with Dr Vianne Bulls  Changes to prescribed antibiotics required: Patient is currently on appropriate antibiotics  Dallie Piles, PharmD 06/15/2018  10:52 AM

## 2018-06-15 NOTE — Consult Note (Signed)
[facility] Nurse wound consult note Reason for Consult: Bilateral foot wounds.  Patient is followed by podiatry (Dr. Caryl Comes) and he has been simultaneously consulted.  His POC and orders will supercede that of the [facility] Nurse (this Probation officer). Wound type: chronic, nonhealing; surgical Pressure Injury POA: N/A Measurement: Left lateral foot wound (full thickness): 8cm x 3cm x 0.2cm with 80% red and 20% yellow wound bed. Right lateral foot wound (full thickness) with 3cm round x 0.2cm 60% yellow/40% red wound with surrounding callus Right plantar foot wound (full thickness) with  1cm x 0.6cm x 0.1cm yellow wound bed. Wound bed:As described aboive Drainage (amount, consistency, odor) Small amount serous to light yellow. Periwound: AS noted above. Intact, with callus on right foot. Dressing procedure/placement/frequency: We are expecting a conisultation from podiatry today. Noted that patient is nauseated, complaining of pain and with irregular heartbeat.  [facility] nursing team will not follow, but will remain available to this patient, the nursing and medical teams.  Please re-consult if needed. Thanks, Maudie Flakes, MSN, RN, [place], Arther Abbott  Pager# [phone]

## 2018-06-16 ENCOUNTER — Inpatient Hospital Stay: Payer: Medicaid Other | Admitting: Certified Registered Nurse Anesthetist

## 2018-06-16 ENCOUNTER — Encounter
Admission: EM | Disposition: A | Discharge status: Home Health | Payer: Self-pay | Source: Home / Self Care | Attending: Internal Medicine

## 2018-06-16 ENCOUNTER — Encounter: Payer: Self-pay | Admitting: Podiatry

## 2018-06-16 HISTORY — PX: BONE EXCISION: SHX6730

## 2018-06-16 HISTORY — PX: ACHILLES TENDON SURGERY: SHX542

## 2018-06-16 LAB — BASIC METABOLIC PANEL
Anion gap: 6 (ref 5–15)
BUN: 22 mg/dL — ABNORMAL HIGH (ref 6–20)
CO2: 26 mmol/L (ref 22–32)
Calcium: 8 mg/dL — ABNORMAL LOW (ref 8.9–10.3)
Chloride: 103 mmol/L (ref 98–111)
Creatinine, Ser: 1.98 mg/dL — ABNORMAL HIGH (ref 0.61–1.24)
GFR calc Af Amer: 51 mL/min — ABNORMAL LOW (ref >60)
GFR calc non Af Amer: 44 mL/min — ABNORMAL LOW (ref >60)
Glucose, Bld: 134 mg/dL — ABNORMAL HIGH (ref 70–99)
Potassium: 3.3 mmol/L — ABNORMAL LOW (ref 3.5–5.1)
Sodium: 135 mmol/L (ref 135–145)

## 2018-06-16 LAB — CBC
HEMATOCRIT: 30.5 % — AB (ref 39.0–52.0)
Hemoglobin: 9.5 g/dL — ABNORMAL LOW (ref 13.0–17.0)
MCH: 26 pg (ref 26.0–34.0)
MCHC: 31.1 g/dL (ref 30.0–36.0)
MCV: 83.6 fL (ref 80.0–100.0)
Platelets: 245 10*3/uL (ref 150–400)
RBC: 3.65 MIL/uL — ABNORMAL LOW (ref 4.22–5.81)
RDW: 13.8 % (ref 11.5–15.5)
WBC: 6.2 10*3/uL (ref 4.0–10.5)
nRBC: 0 % (ref 0.0–0.2)

## 2018-06-16 LAB — GLUCOSE, CAPILLARY
GLUCOSE-CAPILLARY: 140 mg/dL — AB (ref 70–99)
Glucose-Capillary: 119 mg/dL — ABNORMAL HIGH (ref 70–99)
Glucose-Capillary: 179 mg/dL — ABNORMAL HIGH (ref 70–99)
Glucose-Capillary: 127 mg/dL — ABNORMAL HIGH (ref 70–99)
Glucose-Capillary: 145 mg/dL — ABNORMAL HIGH (ref 70–99)
Glucose-Capillary: 247 mg/dL — ABNORMAL HIGH (ref 70–99)

## 2018-06-16 LAB — C-REACTIVE PROTEIN: CRP: 11.2 mg/dL — ABNORMAL HIGH (ref <1.0)

## 2018-06-16 SURGERY — KIDNER PROCEDURE, MODIFIED
Anesthesia: General | Laterality: Bilateral

## 2018-06-16 MED ORDER — PROPOFOL 10 MG/ML IV BOLUS
INTRAVENOUS | Status: AC
  Filled 2018-06-16: qty 20

## 2018-06-16 MED ORDER — DEXAMETHASONE SODIUM PHOSPHATE 10 MG/ML IJ SOLN
INTRAMUSCULAR | Status: AC
  Filled 2018-06-16: qty 1

## 2018-06-16 MED ORDER — FENTANYL CITRATE (PF) 100 MCG/2ML IJ SOLN
INTRAMUSCULAR | Status: AC
  Administered 2018-06-16: 25 ug via INTRAVENOUS
  Filled 2018-06-16: qty 2

## 2018-06-16 MED ORDER — LIDOCAINE HCL (PF) 1 % IJ SOLN
INTRAMUSCULAR | Status: AC
  Filled 2018-06-16: qty 30

## 2018-06-16 MED ORDER — LIDOCAINE HCL (PF) 1 % IJ SOLN
INTRAMUSCULAR | Status: DC | PRN
  Administered 2018-06-16: 4 mL
  Administered 2018-06-16: 3.5 mL

## 2018-06-16 MED ORDER — FENTANYL CITRATE (PF) 100 MCG/2ML IJ SOLN
INTRAMUSCULAR | Status: AC
  Filled 2018-06-16: qty 2

## 2018-06-16 MED ORDER — LIDOCAINE HCL (PF) 2 % IJ SOLN
INTRAMUSCULAR | Status: AC
  Filled 2018-06-16: qty 10

## 2018-06-16 MED ORDER — ONDANSETRON HCL 4 MG/2ML IJ SOLN
INTRAMUSCULAR | Status: AC
  Filled 2018-06-16: qty 2

## 2018-06-16 MED ORDER — LIDOCAINE HCL (CARDIAC) PF 100 MG/5ML IV SOSY
PREFILLED_SYRINGE | INTRAVENOUS | Status: DC | PRN
  Administered 2018-06-16: 80 mg via INTRAVENOUS

## 2018-06-16 MED ORDER — LABETALOL HCL 5 MG/ML IV SOLN
10.0000 mg | INTRAVENOUS | Status: DC | PRN
  Administered 2018-06-16 – 2018-06-20 (×5): 10 mg via INTRAVENOUS
  Filled 2018-06-16 (×5): qty 4

## 2018-06-16 MED ORDER — PHENYLEPHRINE HCL 10 MG/ML IJ SOLN
INTRAMUSCULAR | Status: DC | PRN
  Administered 2018-06-16 (×3): 100 ug via INTRAVENOUS

## 2018-06-16 MED ORDER — ONDANSETRON HCL 4 MG/2ML IJ SOLN: 4.0000 mg | Freq: Once | INTRAMUSCULAR | Status: DC | PRN

## 2018-06-16 MED ORDER — ONDANSETRON HCL 4 MG/2ML IJ SOLN
INTRAMUSCULAR | Status: DC | PRN
  Administered 2018-06-16: 4 mg via INTRAVENOUS

## 2018-06-16 MED ORDER — FENTANYL CITRATE (PF) 100 MCG/2ML IJ SOLN
INTRAMUSCULAR | Status: DC | PRN
  Administered 2018-06-16 (×2): 50 ug via INTRAVENOUS

## 2018-06-16 MED ORDER — BUPIVACAINE HCL (PF) 0.5 % IJ SOLN
INTRAMUSCULAR | Status: DC | PRN
  Administered 2018-06-16: 4 mL
  Administered 2018-06-16: 3.5 mL

## 2018-06-16 MED ORDER — FENTANYL CITRATE (PF) 100 MCG/2ML IJ SOLN
25.0000 ug | INTRAMUSCULAR | Status: DC | PRN
  Administered 2018-06-16 (×2): 25 ug via INTRAVENOUS

## 2018-06-16 MED ORDER — DEXAMETHASONE SODIUM PHOSPHATE 10 MG/ML IJ SOLN
INTRAMUSCULAR | Status: DC | PRN
  Administered 2018-06-16: 5 mg via INTRAVENOUS

## 2018-06-16 MED ORDER — MIDAZOLAM HCL 2 MG/2ML IJ SOLN
INTRAMUSCULAR | Status: AC
  Filled 2018-06-16: qty 2

## 2018-06-16 MED ORDER — BUPIVACAINE HCL (PF) 0.5 % IJ SOLN
INTRAMUSCULAR | Status: AC
  Filled 2018-06-16: qty 30

## 2018-06-16 MED ORDER — PROPOFOL 10 MG/ML IV BOLUS
INTRAVENOUS | Status: DC | PRN
  Administered 2018-06-16: 150 mg via INTRAVENOUS

## 2018-06-16 MED ORDER — MIDAZOLAM HCL 2 MG/2ML IJ SOLN
INTRAMUSCULAR | Status: DC | PRN
  Administered 2018-06-16: 2 mg via INTRAVENOUS

## 2018-06-16 SURGICAL SUPPLY — 64 items
"PENCIL ELECTRO HAND CTR " (MISCELLANEOUS) ×1 IMPLANT
BAG COUNTER SPONGE EZ (MISCELLANEOUS) ×1 IMPLANT
BANDAGE ELASTIC 4 LF NS (GAUZE/BANDAGES/DRESSINGS) ×3 IMPLANT
BLADE OSC/SAGITTAL 5.5X25 (BLADE) ×3 IMPLANT
BLADE OSCILLATING/SAGITTAL (BLADE) ×2
BLADE SURG 15 STRL LF DISP TIS (BLADE) ×1 IMPLANT
BLADE SURG 15 STRL SS (BLADE) ×6
BLADE SURG MINI STRL (BLADE) ×6 IMPLANT
BLADE SW THK.38XMED LNG THN (BLADE) IMPLANT
BNDG COHESIVE 6X5 TAN STRL LF (GAUZE/BANDAGES/DRESSINGS) ×2 IMPLANT
BNDG CONFORM 3 STRL LF (GAUZE/BANDAGES/DRESSINGS) ×3 IMPLANT
BNDG ESMARK 4X12 TAN STRL LF (GAUZE/BANDAGES/DRESSINGS) ×3 IMPLANT
BNDG GAUZE 4.5X4.1 6PLY STRL (MISCELLANEOUS) ×3 IMPLANT
CANISTER SUCT 1200ML W/VALVE (MISCELLANEOUS) ×3 IMPLANT
CLOSURE WOUND 1/4X4 (GAUZE/BANDAGES/DRESSINGS) ×1
CNTNR SPEC 2.5X3XGRAD LEK (MISCELLANEOUS) ×2
CONT SPEC 4OZ STER OR WHT (MISCELLANEOUS) ×4
CONTAINER SPEC 2.5X3XGRAD LEK (MISCELLANEOUS) IMPLANT
COUNTER SPONGE BAG EZ (MISCELLANEOUS)
COVER WAND RF STERILE (DRAPES) ×3 IMPLANT
CUFF TOURN 18 STER (MISCELLANEOUS) ×1 IMPLANT
CUFF TOURN 24 STER (MISCELLANEOUS) ×4 IMPLANT
CUFF TOURN DUAL PL 12 NO SLV (MISCELLANEOUS) ×1 IMPLANT
DRAPE FLUOR MINI C-ARM 54X84 (DRAPES) ×3 IMPLANT
DURAPREP 26ML APPLICATOR (WOUND CARE) ×5 IMPLANT
ELECT REM PT RETURN 9FT ADLT (ELECTROSURGICAL) ×3
ELECTRODE REM PT RTRN 9FT ADLT (ELECTROSURGICAL) ×1 IMPLANT
GAUZE 4X4 16PLY RFD (DISPOSABLE) ×2 IMPLANT
GAUZE PETRO XEROFOAM 1X8 (MISCELLANEOUS) ×3 IMPLANT
GAUZE SPONGE 4X4 12PLY STRL (GAUZE/BANDAGES/DRESSINGS) ×3 IMPLANT
GLOVE BIO SURGEON STRL SZ8 (GLOVE) ×5 IMPLANT
GLOVE INDICATOR 8.0 STRL GRN (GLOVE) ×5 IMPLANT
GOWN STRL REUS W/ TWL LRG LVL3 (GOWN DISPOSABLE) ×2 IMPLANT
GOWN STRL REUS W/TWL LRG LVL3 (GOWN DISPOSABLE) ×6
HANDPIECE VERSAJET DEBRIDEMENT (MISCELLANEOUS) ×2 IMPLANT
IV NS 1000ML (IV SOLUTION) ×2
IV NS 1000ML BAXH (IV SOLUTION) IMPLANT
KIT TURNOVER KIT A (KITS) ×3 IMPLANT
LABEL OR SOLS (LABEL) ×3 IMPLANT
NDL HYPO 25X1 1.5 SAFETY (NEEDLE) ×2 IMPLANT
NDL SAFETY ECLIPSE 18X1.5 (NEEDLE) ×1 IMPLANT
NEEDLE HYPO 18GX1.5 SHARP (NEEDLE) ×4
NEEDLE HYPO 25X1 1.5 SAFETY (NEEDLE) ×6 IMPLANT
NS IRRIG 500ML POUR BTL (IV SOLUTION) ×3 IMPLANT
PACK EXTREMITY ARMC (MISCELLANEOUS) ×3 IMPLANT
PENCIL ELECTRO HAND CTR (MISCELLANEOUS) ×3 IMPLANT
RASP SM TEAR CROSS CUT (RASP) ×2 IMPLANT
SPLINT CAST 1 STEP 4X30 (MISCELLANEOUS) ×4 IMPLANT
STOCKINETTE STRL 6IN 960660 (GAUZE/BANDAGES/DRESSINGS) ×5 IMPLANT
STRIP CLOSURE SKIN 1/4X4 (GAUZE/BANDAGES/DRESSINGS) ×2 IMPLANT
SUT ETHILON 3-0 (SUTURE) ×6 IMPLANT
SUT ETHILON 3-0 FS-10 30 BLK (SUTURE) ×6
SUT VIC AB 2-0 SH 27 (SUTURE) ×2
SUT VIC AB 2-0 SH 27XBRD (SUTURE) ×1 IMPLANT
SUT VIC AB 4-0 FS2 27 (SUTURE) ×3 IMPLANT
SUT VICRYL 3-0 27IN (SUTURE) ×2 IMPLANT
SUT VICRYL AB 3-0 FS1 BRD 27IN (SUTURE) ×3 IMPLANT
SUTURE EHLN 3-0 FS-10 30 BLK (SUTURE) IMPLANT
SWAB CULTURE AMIES ANAERIB BLU (MISCELLANEOUS) ×4 IMPLANT
SYR 10ML LL (SYRINGE) ×5 IMPLANT
TUBING CONNECTING 10 (TUBING) ×1 IMPLANT
TUBING CONNECTING 10' (TUBING) ×1
WIRE Z .045 C-WIRE SPADE TIP (WIRE) ×2 IMPLANT
WIRE Z .062 C-WIRE SPADE TIP (WIRE) IMPLANT

## 2018-06-16 NOTE — Progress Notes (Signed)
[facility] INFECTIOUS DISEASE PROGRESS NOTE Date of Admission:  06/14/2018     ID: Shawn [name] is a 32 y.o. male with Bilateral DM foot infection and MRSA bacteremia Active Problems:   Sepsis ([place])   Subjective: No fevers, wbc down to 6  ROS  Eleven systems are reviewed and negative except per hpi  Medications:  Antibiotics Given (last 72 hours)    Date/Time Action Medication Dose Rate   06/14/18 1720 New Bag/Given   aztreonam (AZACTAM) 2 g in sodium chloride 0.9 % 100 mL IVPB 2 g 200 mL/hr   06/14/18 1755 New Bag/Given   vancomycin (VANCOCIN) IVPB 1000 mg/200 mL premix 1,000 mg 200 mL/hr   06/14/18 2324 New Bag/Given   Ampicillin-Sulbactam (UNASYN) 3 g in sodium chloride 0.9 % 100 mL IVPB 3 g 200 mL/hr   06/15/18 0512 New Bag/Given   Ampicillin-Sulbactam (UNASYN) 3 g in sodium chloride 0.9 % 100 mL IVPB 3 g 200 mL/hr   06/15/18 0909 New Bag/Given   vancomycin (VANCOCIN) IVPB 750 mg/150 ml premix 750 mg 150 mL/hr   06/15/18 1345 New Bag/Given   piperacillin-tazobactam (ZOSYN) IVPB 3.375 g 3.375 g 12.5 mL/hr   06/15/18 2113 New Bag/Given   piperacillin-tazobactam (ZOSYN) IVPB 3.375 g 3.375 g 12.5 mL/hr   06/16/18 0435 New Bag/Given   vancomycin (VANCOCIN) IVPB 750 mg/150 ml premix 750 mg 150 mL/hr   06/16/18 0608 New Bag/Given   piperacillin-tazobactam (ZOSYN) IVPB 3.375 g 3.375 g 12.5 mL/hr     . ferrous sulfate  325 mg Oral BID WC  . FLUoxetine  20 mg Oral QHS  . insulin aspart  0-5 Units Subcutaneous QHS  . insulin aspart  0-9 Units Subcutaneous TID WC  . insulin aspart  4 Units Subcutaneous TID WC  . insulin glargine  20 Units Subcutaneous QHS    Objective: Vital signs in last 24 hours: Temp:  [98.6 F (37 C)-100.1 F (37.8 C)] 98.6 F (37 C) (12/13 0404) Pulse Rate:  [99-116] 99 (12/13 0610) Resp:  [16-20] 20 (12/13 0404) BP: (128-140)/(88-113) 128/88 (12/13 0610) SpO2:  [97 %-100 %] 100 % (12/13 0404) Constitutional: Thin, chronically ill  appearing HENT: anicteric Mouth/Throat: Oropharynx is clear and moist. No oropharyngeal exudate.  Cardiovascular: Normal rate, regular rhythm and normal heart sounds. Pulmonary/Chest: Effort normal and breath sounds normal. No respiratory distress. He has no wheezes.  Abdominal: Soft. Bowel sounds are normal. He exhibits no distension. There is no tenderness.  Lymphadenopathy: He has no cervical adenopathy.  Neurological: He is alert and oriented to person, place, and time.  Skin: L foot with lateral wound but no exposed bone, some serous drainage R foot with lateral wound, and heaped up bunion with some necrotic tissue Psychiatric: He has a normal mood and affect. His behavior is normal.   Lab Results Recent Labs    06/15/18 0512 06/16/18 0355  WBC 12.3* 6.2  HGB 11.2* 9.5*  HCT 35.3* 30.5*  NA 134* 135  K 4.0 3.3*  CL 99 103  CO2 26 26  BUN 20 22*  CREATININE 1.73* 1.98*    Microbiology: @micro @ Studies/Results: Dg Chest 2 View  Result Date: 06/14/2018 CLINICAL DATA:  Tachycardia and hypertension EXAM: CHEST - 2 VIEW COMPARISON:  May 28, 2018 FINDINGS: No edema or consolidation. Heart size and pulmonary vascularity are normal. No adenopathy. No bone lesions. IMPRESSION: No edema or consolidation. Electronically Signed   By: Lowella Grip III M.D.   On: 06/14/2018 14:43   Mr Foot  Right Wo Contrast  Result Date: 06/15/2018 CLINICAL DATA:  Uncontrolled diabetes. History of prior amputations. EXAM: MRI OF THE RIGHT FOREFOOT WITHOUT CONTRAST TECHNIQUE: Multiplanar, multisequence MR imaging of the right foot was performed. No intravenous contrast was administered. COMPARISON:  MRI 01/11/2018 FINDINGS: Prior amputations involving the fourth and fifth metatarsals with expected postoperative changes. I do not see any definite MR findings for recurrent osteomyelitis. There is diffuse signal abnormality in the second metatarsal with low T1 and high T2 signal intensity and  significant surrounding edema consistent with osteomyelitis. Similar but less significant changes involving the base of the third metatarsal and the middle and lateral cuneiforms. Diffuse cellulitis and myofasciitis without definite findings for pyomyositis. IMPRESSION: 1. Prior amputations involving the fourth and fifth metatarsals without definite findings for recurrent osteomyelitis. 2. Changes consistent with osteomyelitis involving the second and third metatarsals along with the middle and lateral cuneiforms. 3. Diffuse cellulitis and myofasciitis. Electronically Signed   By: Marijo Sanes M.D.   On: 06/15/2018 08:12   Mr Foot Left Wo Contrast  Result Date: 06/15/2018 CLINICAL DATA:  Uncontrolled diabetes with diabetic foot ulcers. History of prior amputations. EXAM: MRI OF THE LEFT FOOT WITHOUT CONTRAST TECHNIQUE: Multiplanar, multisequence MR imaging of the left foot was performed. No intravenous contrast was administered. COMPARISON:  Left foot radiographs 04/05/2018 FINDINGS: Evidence of prior amputations involving the fourth and fifth metatarsals. There are open wounds along the lateral aspect of the foot near the base of the fourth and fifth metatarsals with gas/fluid/abscess extending down to the bones. There is osteomyelitis involving the bases of the amputated fourth and fifth metatarsals. There is also diffuse osteomyelitis involving the third metatarsal along with a pathologic fracture of the third metatarsal neck. Osteomyelitis involving the second metatarsal also with possible pathologic fracture involving the metatarsal head and neck. There is osteomyelitis involving the cuneiforms and cuboid. I do not see any involvement of the navicular bone, talus or calcaneus for certain. Diffuse cellulitis and myofasciitis without obvious changes of pyomyositis. Small tibiotalar and subtalar joint effusions. There is also some fluid and edema in the sinus tarsi. IMPRESSION: 1. Extensive osteomyelitis  involving the midfoot and forefoot. Osteomyelitis involves the bases of the amputated fourth and fifth metatarsals, the second and third metatarsals, the 3 cuneiform bones and the cuboid. 2. Open wound along the lateral aspect of the foot with diffuse cellulitis and myofasciitis. Electronically Signed   By: Marijo Sanes M.D.   On: 06/15/2018 08:05   Dg Foot Complete Left  Result Date: 06/15/2018 CLINICAL DATA:  Left foot pain. History of prior amputation for osteomyelitis. EXAM: LEFT FOOT - COMPLETE 3+ VIEW COMPARISON:  Plain films left foot 04/05/2018. FINDINGS: Since the prior examination, the patient has undergone amputation of the fourth and fifth rays at the level of the proximal metatarsals. The patient has a fracture of the neck of the third metatarsal with extensive periosteal new bone formation about the fracture. Margin of the amputation site of the fifth metatarsal has an irregular appearance. Fourth metatarsal amputation site appears corticated with some periosteal new bone formation present. A small soft tissue wound is noted over the fifth metatarsal stump. Soft tissues of the foot appear swollen. IMPRESSION: Soft tissue wound on the lateral aspect of the foot with findings worrisome for osteomyelitis in the distal aspect of the fifth metatarsal remnant. Fracture of the third metatarsal neck with periosteal new bone formation. The fracture can not be definitively characterized but could be due to osteomyelitis. Electronically Signed  By: Inge Rise M.D.   On: 06/15/2018 15:20   Dg Foot Complete Right  Result Date: 06/15/2018 CLINICAL DATA:  Osteomyelitis of right foot. EXAM: RIGHT FOOT COMPLETE - 3+ VIEW COMPARISON:  MRI of same day.  Radiographs of January 10, 2018. FINDINGS: Status post surgical amputation of large portions of the fourth and fifth metatarsals and associated phalanges. Vascular calcifications are noted. Soft tissue ulcerations are seen lateral to the third  metatarsophalangeal joint as well as the residual portion of the proximal fifth metatarsal. No definite lytic destruction is seen radiographically. IMPRESSION: Postsurgical changes as described above. No lytic destruction is seen radiographically to suggest osteomyelitis. Soft tissue ulcerations are noted. Electronically Signed   By: Marijo Conception, M.D.   On: 06/15/2018 15:20    Assessment/Plan: Shawn [name] is a 32 y.o. 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  He was treated with IV unasyn from 10/2- 11//15/19 for L foot infection. Then received 2 weeks PO augmentin. The infections have certainly progressed since that time and now have identified MRSA. 12/13 - fu bcx 12/12 ngtd, no fevers. TTE neg endocarditis but does have possible thrombus Lateral wall of LV. Wbc down  Recommendations MRSA bacteremia - Continue vancomycin  Can hold on TEE unless need to eval the possible LV thrombus.   DM foot infection and osteomyelitis- bilateral. Surgery 12/13 Cont zosyn  for pseudomonal coverage and anaerobic coverage. Deep cultures should be sent as well.  Thank you very much for the consult. Will follow with you.  Leonel Ramsay   06/16/2018, 9:54 AM

## 2018-06-16 NOTE — Anesthesia Preprocedure Evaluation (Addendum)
Anesthesia Evaluation  Patient identified by MRN, date of birth, ID band Patient awake    Reviewed: Allergy & Precautions, NPO status , Patient's Chart, lab work & pertinent test results  History of Anesthesia Complications Negative for: history of anesthetic complications  Airway Mallampati: II       Dental  (+) Missing, Chipped   Pulmonary neg sleep apnea, neg COPD,           Cardiovascular (-) hypertension     Neuro/Psych neg Seizures Depression    GI/Hepatic Neg liver ROS, GERD  Medicated and Controlled,  Endo/Other  diabetes, Type 1, Insulin Dependent  Renal/GU Renal InsufficiencyRenal disease     Musculoskeletal   Abdominal   Peds  Hematology   Anesthesia Other Findings   Reproductive/Obstetrics                            Anesthesia Physical Anesthesia Plan  ASA: III  Anesthesia Plan: General   Post-op Pain Management:    Induction: Intravenous  PONV Risk Score and Plan: 2 and Dexamethasone and Ondansetron  Airway Management Planned: LMA  Additional Equipment:   Intra-op Plan:   Post-operative Plan:   Informed Consent: I have reviewed the patients History and Physical, chart, labs and discussed the procedure including the risks, benefits and alternatives for the proposed anesthesia with the patient or authorized representative who has indicated his/her understanding and acceptance.     Plan Discussed with:   Anesthesia Plan Comments:         Anesthesia Quick Evaluation

## 2018-06-16 NOTE — Consult Note (Signed)
Pharmacy Antibiotic Note  Shawn [name] is a 32 y.o. male admitted on 06/14/2018 with cellulitis.  Pharmacy has been consulted for Zosyn and Vancomycin. dosing. Since admission his blood has grown out MRSA.  His SCr has increased slightly from yesterday (approximate baseline of 1.5) and is calculated to be at the upper end of the vancomycin therapeutic goal. Doses have been given on time. The patient was started on Unasyn initially that was changed to Zosyn by ID on 12/12 for anaerobe and pseudomonal coverage.  Plan: 1) Continue Zosyn EI IV every 8 hours  2) Continue vancomycin to 750mg  every 18 hours. If Scr increases further we will need to adjust the dose.    Ke=0.038  Vd = 36.75 L   t 1/2 18h  Css 37.7/19.8 mcg/mL  Goal Vt 15-20 mcg/mL  Vt prior to 4th dose on 12/14  Height: 5\' 6"  (167.6 cm) Weight: 115 lb (52.2 kg) IBW/kg (Calculated) : 63.8  Temp (24hrs), Avg:98.3 F (36.8 C), Min:97.1 F (36.2 C), Max:99.2 F (37.3 C)  Recent Labs  Lab 06/14/18 1400 06/14/18 1408 06/14/18 1800 06/15/18 0512 06/15/18 1153 06/16/18 0355  WBC  --  21.2*  --  12.3*  --  6.2  CREATININE  --  2.14*  --  1.73*  --  1.98*  LATICACIDVEN 1.91*  --  1.1  --  0.6  --     Estimated Creatinine Clearance: 40.3 mL/min (A) (by C-G formula based on SCr of 1.98 mg/dL (H)).    Antimicrobials this admission: Vancomycin 12/11 >>  Zosyn 12/12 >> Unasyn 12/11 >>12/12 Aztreonam 12/11 x1  Microbiology results: 12/11 BCx: MRSA  Thank you for allowing pharmacy to be a part of this patient's care.  Dallie Piles, PharmD Clinical Pharmacist 06/16/2018 1:51 PM

## 2018-06-16 NOTE — Anesthesia Post-op Follow-up Note (Signed)
Anesthesia QCDR form completed.        

## 2018-06-16 NOTE — H&P (Signed)
H and P has been reviewed and no changes are noted.  

## 2018-06-16 NOTE — Transfer of Care (Signed)
Immediate Anesthesia Transfer of Care Note  Patient: Shawn [name]  Procedure(s) Performed: ACHILLES LENGTHENING/KIDNER (Bilateral ) BONE EXCISION AND SOFT TISSUE (Bilateral )  Patient Location: PACU  Anesthesia Type:General  Level of Consciousness: awake, alert  and oriented  Airway & Oxygen Therapy: Patient Spontanous Breathing and Patient connected to face mask oxygen  Post-op Assessment: Report given to RN and Post -op Vital signs reviewed and stable  Post vital signs: Reviewed  Last Vitals:  Vitals Value Taken Time  BP 110/79 06/16/2018  2:03 PM  Temp 36.9 C 06/16/2018  2:03 PM  Pulse 89 06/16/2018  2:04 PM  Resp 19 06/16/2018  2:04 PM  SpO2 100 % 06/16/2018  2:04 PM  Vitals shown include unvalidated device data.  Last Pain:  Vitals:   06/16/18 1403  TempSrc: Temporal  PainSc:       Patients Stated Pain Goal: 1 (61/47/09 2957)  Complications: No apparent anesthesia complications

## 2018-06-16 NOTE — Progress Notes (Signed)
Called Dr. Marcille Blanco regarding patient's elevated blood pressure.  Patient states that he takes lisinopril at home.  Appropriate orders were placed.  Christene Slates  06/16/2018  7:43 AM

## 2018-06-16 NOTE — Op Note (Signed)
Operative note   Surgeon: Dr. Albertine Patricia, DPM.    Assistant;None    Preop diagnosis: 1.  Equinus deformity bilateral Achilles tendons 2.  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  Plantarflexed third metatarsal right foot 4.  Full-thickness ulcerations sub-met 3 and fifth metatarsal tuberosity right foot 5.  Full-thickness ulceration fifth metatarsal tuberosity area left foot with cellulitis and drainage   Postop diagnosis: Same    Procedure:   1.  Bilateral percutaneous Achilles lengthenings   2.  Resection of fifth metatarsal base and tuberosities with 3 anchor peroneus brevis tendon to the bone with 3-0 Vicryl   3.  Surgical fracture of third metatarsal right foot 4.  Debridement of ulcerations.  These were excisional debridements of wounds on the right foot at fifth met tuberosity which was 2 cm long and 1.8 cm in width with 8 mm of depth.  The third metatarsal head ulcer on the right foot was 1 cm in diameter with 3 mm of depth and the left foot ulceration was 9 mm in diameter with 1.5 cm of depth     EBL: 30 cc    Anesthesia:general delivered by the anesthesia team I delivered 15 cc of a mixture of lidocaine and Marcaine to the Achilles areas prior to surgery.    Hemostasis: Thigh tourniquet at 250 mils mercury pressure for 25 minutes right 28 minutes left.  When these were released prior to complete vascular return was seen to both feet    Specimen: Bone cultures of both fifth metatarsal tuberosities and also pathology specimen sent which were marked proximally for clear margins    Complications: None    Operative indications: Chronic recurrent ulcerations with likely osteomyelitis to the fifth metatarsal areas.  Chronic extensive tightening to the Achilles tendon which is contributory to the recurrence of the ulcers.    Procedure:  Patient was brought into the OR and placed on the operating table in thesupine position. After anesthesia was  obtained theright and left lower extremity was prepped and draped in usual sterile fashion.  Operative Report:  This time attention was directed to the right Achilles tendon the foot portion was left draped in the Achilles tendon which been prepped and draped earlier was identified.  A stab incision was made 1.5 cm on the medial proximal aspect of the tendon and 3 and half centimeters on the distal aspect and another on the proximal medial aspect at 6 cm above the previous first incision.  The 15 blade was used to release a third of the tendon on each stab.  Dorsiflexor he pressure was placed was being done and a slide of the tendon was accomplished.  After the slide the tendon was palpable but the dorsiflexor capacity of the ankle was now all the way up to at least 0 to +5 degrees whereas before he can only get to -10 degrees.  These areas were then cleaned and irrigated with saline and sutured with simple interrupted 3-0 nylon sutures.  This area was then covered and attention was directed to the third metatarsal distal shaft dorsally where a 1.5 cm linear incision was made deepened sharp blunt dissection periosteum over the distal shaft identified and at this point an osteotomy was performed through the third metatarsal distal shaft of the surgical neck region to allow for dorsiflexor capacity of the tarsal to alleviate stress on that region.  He had a stress fracture previously on the third metatarsal of the right foot which eliminated  the tendency to ulcer underneath that region.  This time attention was directed to the lateral fifth metatarsal area there was a large ulceration on the region 2 elliptical incisions were made over this ulceration and the ulcer was removed the incision was deepened down to bone the prominent portions of the fifth metatarsal shaft at its base and the tuberosity were identified and resected.  Portion of the tuberosity was allowed to stay and remained this was rasped smoothly  the peroneus brevis tendon still had some attachment but I did drill a hole through the good bone across that region and anchor the tendon further with a 3-0 Vicryl simple erupted suture through the tendon.  Further closure of the soft tissue was accomplished with 3-0 Vicryl in a continuous stitch.  Skin was then closed primarily with 3-0 nylon simple interrupted sutures.  The previous ulcer was able be closed primarily.  Area was then dressed with a sterile compressive dressing on the foot and the leg.  This time attention was directed to the left leg and foot a similar procedure was done on the Achilles tendon on the left leg and attention was directed to the lateral left foot fifth metatarsal tuberosity region.  Patient has still an open wound on the dorsal area but is granulating nicely and made an incision along the juncture between this and normal skin laterally.  There is also an ulceration plantarly which probe down to bone in that region.  The incision was carried down to the bone and soft tissue was reflected away from the tuberosity.  The point where the ulcer penetrated down to bone was cultured at this point at the bone level.  The fifth metatarsal tuberosity was then resected except for the proximal 1 cc of the area to try to leave a little bit of attachment of the peroneus brevis tendon.  Perez brevis tendon was then anchored to this region again with 3-0 Vicryl suture.  The bone was cultured and the proximal margin was also sent to pathology for margins of clearance.  The plantar wound this point was incised with 2 similar incisions and closed with 2 simple running 3-0 nylon's.  After copious irrigation the lateral fifth metatarsal wound was then closed with combination of 3-0 Vicryl continuous stitch as well as 3-0 nylon simple interrupted sutures.  This time sterile compressive dressings placed across this area consisting of Xeroform gauze 4 x 4's Kling Kerlix as it was on the right foot.  2  posterior splints were placed on the right foot leg the operating room to maintain lengthening of the tendon and to hold his foot in better position.  He is to remain nonweightbearing at this timeframe.    Patient tolerated the procedure and anesthesia well.  Was transported from the OR to the PACU with all vital signs stable and vascular status intact. To be discharged per routine protocol.  Will follow up in approximately 1 week in the outpatient clinic.

## 2018-06-16 NOTE — Anesthesia Postprocedure Evaluation (Signed)
Anesthesia Post Note  Patient: Shawn [name]  Procedure(s) Performed: ACHILLES LENGTHENING/KIDNER (Bilateral ) BONE EXCISION AND SOFT TISSUE (Bilateral )  Patient location during evaluation: PACU Anesthesia Type: General Level of consciousness: awake and alert Pain management: pain level controlled Vital Signs Assessment: post-procedure vital signs reviewed and stable Respiratory status: spontaneous breathing and respiratory function stable Cardiovascular status: stable Anesthetic complications: no     Last Vitals:  Vitals:   06/16/18 1518 06/16/18 1603  BP: (!) 139/98 (!) 141/98  Pulse: (!) 105 (!) 103  Resp: 12 18  Temp: 36.8 C 36.8 C  SpO2: 98% 99%    Last Pain:  Vitals:   06/16/18 1637  TempSrc:   PainSc: 8                  [name]

## 2018-06-16 NOTE — Care Management (Signed)
Off the floor for procedure.  Patient open with [place].  Corene Cornea with [facility] aware of admission.  Notified by ID that patient will require home IV antibiotics

## 2018-06-16 NOTE — Anesthesia Procedure Notes (Signed)
Procedure Name: LMA Insertion Date/Time: 06/16/2018 11:56 AM Performed by: Johnna Acosta, CRNA Pre-anesthesia Checklist: Patient identified, Emergency Drugs available, Suction available, Patient being monitored and Timeout performed Patient Re-evaluated:Patient Re-evaluated prior to induction Oxygen Delivery Method: Circle system utilized Preoxygenation: Pre-oxygenation with 100% oxygen Induction Type: IV induction LMA: LMA inserted LMA Size: 3.5 Tube type: Oral Number of attempts: 1 Placement Confirmation: positive ETCO2 and breath sounds checked- equal and bilateral Tube secured with: Tape Dental Injury: Teeth and Oropharynx as per pre-operative assessment

## 2018-06-17 ENCOUNTER — Encounter: Payer: Self-pay | Admitting: Podiatry

## 2018-06-17 LAB — GLUCOSE, CAPILLARY
Glucose-Capillary: 277 mg/dL — ABNORMAL HIGH (ref 70–99)
Glucose-Capillary: 273 mg/dL — ABNORMAL HIGH (ref 70–99)
Glucose-Capillary: 148 mg/dL — ABNORMAL HIGH (ref 70–99)
Glucose-Capillary: 167 mg/dL — ABNORMAL HIGH (ref 70–99)
Glucose-Capillary: 172 mg/dL — ABNORMAL HIGH (ref 70–99)

## 2018-06-17 LAB — CULTURE, BLOOD (SINGLE): Special Requests: ADEQUATE

## 2018-06-17 LAB — VANCOMYCIN, TROUGH: Vancomycin Tr: 18 ug/mL (ref 15–20)

## 2018-06-17 MED ORDER — INSULIN GLARGINE 100 UNIT/ML ~~LOC~~ SOLN
25.0000 [IU] | Freq: Every day | SUBCUTANEOUS | Status: DC
  Administered 2018-06-17 – 2018-06-19 (×3): 25 [IU] via SUBCUTANEOUS
  Filled 2018-06-17 (×4): qty 0.25

## 2018-06-17 MED ORDER — HEPARIN SODIUM (PORCINE) 5000 UNIT/ML IJ SOLN
5000.0000 [IU] | Freq: Three times a day (TID) | INTRAMUSCULAR | Status: DC
  Administered 2018-06-17 – 2018-06-20 (×9): 5000 [IU] via SUBCUTANEOUS
  Filled 2018-06-17 (×9): qty 1

## 2018-06-17 MED ORDER — HYDROMORPHONE HCL 1 MG/ML IJ SOLN
1.0000 mg | INTRAMUSCULAR | Status: DC | PRN
  Administered 2018-06-17 – 2018-06-19 (×12): 1 mg via INTRAVENOUS
  Filled 2018-06-17 (×13): qty 1

## 2018-06-17 MED ORDER — ZOLPIDEM TARTRATE 5 MG PO TABS
5.0000 mg | ORAL_TABLET | Freq: Every evening | ORAL | Status: DC | PRN
  Administered 2018-06-17 – 2018-06-18 (×3): 5 mg via ORAL
  Filled 2018-06-17 (×3): qty 1

## 2018-06-17 NOTE — Consult Note (Signed)
Pharmacy Antibiotic Note  Shawn [name] is a 32 y.o. male admitted on 06/14/2018 with cellulitis.  Pharmacy has been consulted for Zosyn and Vancomycin. dosing. Since admission his blood has grown out MRSA.  His SCr has increased slightly from yesterday (approximate baseline of 1.5) and is calculated to be at the upper end of the vancomycin therapeutic goal. Doses have been given on time. The patient was started on Unasyn initially that was changed to Zosyn by ID on 12/12 for anaerobe and pseudomonal coverage.  Plan: 1) Continue Zosyn EI IV every 8 hours  2) 12/14 Vanc trough 18. Continue vancomycin to 750mg  every 18 hours. If Scr increases further we will need to adjust the dose. Continue to monitor patient and draw next vanc trough when clinically appropriate.    Ke=0.038  Vd = 36.75 L   t 1/2 18h  Css 37.7/19.8 mcg/mL  Goal Vt 15-20 mcg/mL    Height: 5\' 6"  (167.6 cm) Weight: 115 lb (52.2 kg) IBW/kg (Calculated) : 63.8  Temp (24hrs), Avg:98.6 F (37 C), Min:98.4 F (36.9 C), Max:98.7 F (37.1 C)  Recent Labs  Lab 06/14/18 1400 06/14/18 1408 06/14/18 1800 06/15/18 0512 06/15/18 1153 06/16/18 0355 06/17/18 1525  WBC  --  21.2*  --  12.3*  --  6.2  --   CREATININE  --  2.14*  --  1.73*  --  1.98*  --   LATICACIDVEN 1.91*  --  1.1  --  0.6  --   --   VANCOTROUGH  --   --   --   --   --   --  18    Estimated Creatinine Clearance: 40.3 mL/min (A) (by C-G formula based on SCr of 1.98 mg/dL (H)).    Antimicrobials this admission: Vancomycin 12/11 >>  Zosyn 12/12 >> Unasyn 12/11 >>12/12 Aztreonam 12/11 x1  Microbiology results: 12/11 BCx: MRSA  Thank you for allowing pharmacy to be a part of this patient's care.  Pernell Dupre, PharmD, BCPS Clinical Pharmacist 06/17/2018 4:13 PM

## 2018-06-17 NOTE — Progress Notes (Signed)
[facility] NAME: Shawn [name]    MR#:  [id number]  DATE OF BIRTH:  Feb 10, 1986  SUBJECTIVE:  CHIEF COMPLAINT:   Chief Complaint  Patient presents with  . Tachycardia  . Nausea  . Foot Pain  . Emesis   -Patient with bilateral osteomyelitis of fifth metatarsal and Achilles tendon deformity-status post surgery and postop day 1 today -No fevers.  Complains of significant pain  REVIEW OF SYSTEMS:  Review of Systems  Constitutional: Negative for chills, fever and malaise/fatigue.  HENT: Negative for congestion, ear discharge, hearing loss and nosebleeds.   Eyes: Negative for blurred vision and double vision.  Respiratory: Negative for cough, shortness of breath and wheezing.   Cardiovascular: Negative for chest pain and palpitations.  Gastrointestinal: Positive for abdominal pain and nausea. Negative for constipation, diarrhea and vomiting.  Genitourinary: Negative for dysuria.  Musculoskeletal: Positive for joint pain and myalgias.  Neurological: Negative for dizziness, focal weakness, seizures, weakness and headaches.  Psychiatric/Behavioral: Negative for depression.    DRUG ALLERGIES:   Allergies  Allergen Reactions  . Banana Hives, Nausea And Vomiting and Rash  . Keflex [Cephalexin] Rash    No swelling- also taken penicillin without any issue.  . Onion Hives, Nausea And Vomiting and Rash  . Sulfa Antibiotics Anaphylaxis  . Grapeseed Extract [Nutritional Supplements] Itching  . Shellfish Allergy Hives    "ALL SEAFOOD"    VITALS:  Blood pressure (!) 138/97, pulse 96, temperature 98.4 F (36.9 C), temperature source Oral, resp. rate 20, height 5\' 6"  (1.676 m), weight 52.2 kg, SpO2 99 %.  PHYSICAL EXAMINATION:  Physical Exam   GENERAL:  32 y.o.-year-old thin built patient lying in the bed with no acute distress.  EYES: Pupils equal, round, reactive to light and accommodation. No scleral icterus. Extraocular  muscles intact.  HEENT: Head atraumatic, normocephalic. Oropharynx and nasopharynx clear.  NECK:  Supple, no jugular venous distention. No thyroid enlargement, no tenderness.  LUNGS: Normal breath sounds bilaterally, no wheezing, rales,rhonchi or crepitation. No use of accessory muscles of respiration.  CARDIOVASCULAR: S1, S2 normal. No murmurs, rubs, or gallops.  ABDOMEN: Soft, nontender, nondistended. Bowel sounds present. No organomegaly or mass.  EXTREMITIES: Both feet are in splints and heavy dressing present NEUROLOGIC: Cranial nerves II through XII are intact. Muscle strength 5/5 in all extremities. Sensation intact. Gait not checked.  PSYCHIATRIC: The patient is alert and oriented x 3.  SKIN: No obvious rash, lesion, or ulcer.    LABORATORY PANEL:   CBC Recent Labs  Lab 06/16/18 0355  WBC 6.2  HGB 9.5*  HCT 30.5*  PLT 245   ------------------------------------------------------------------------------------------------------------------  Chemistries  Recent Labs  Lab 06/14/18 1408  06/16/18 0355  NA 129*   < > 135  K 4.5   < > 3.3*  CL 90*   < > 103  CO2 24   < > 26  GLUCOSE 515*   < > 134*  BUN 20   < > 22*  CREATININE 2.14*   < > 1.98*  CALCIUM 8.7*   < > 8.0*  AST 17  --   --   ALT 9  --   --   ALKPHOS 160*  --   --   BILITOT 0.8  --   --    < > = values in this interval not displayed.   ------------------------------------------------------------------------------------------------------------------  Cardiac Enzymes No results for input(s): TROPONINI in the last 168 hours. ------------------------------------------------------------------------------------------------------------------  RADIOLOGY:  Dg Foot Complete Left  Result Date: 06/15/2018 CLINICAL DATA:  Left foot pain. History of prior amputation for osteomyelitis. EXAM: LEFT FOOT - COMPLETE 3+ VIEW COMPARISON:  Plain films left foot 04/05/2018. FINDINGS: Since the prior examination, the  patient has undergone amputation of the fourth and fifth rays at the level of the proximal metatarsals. The patient has a fracture of the neck of the third metatarsal with extensive periosteal new bone formation about the fracture. Margin of the amputation site of the fifth metatarsal has an irregular appearance. Fourth metatarsal amputation site appears corticated with some periosteal new bone formation present. A small soft tissue wound is noted over the fifth metatarsal stump. Soft tissues of the foot appear swollen. IMPRESSION: Soft tissue wound on the lateral aspect of the foot with findings worrisome for osteomyelitis in the distal aspect of the fifth metatarsal remnant. Fracture of the third metatarsal neck with periosteal new bone formation. The fracture can not be definitively characterized but could be due to osteomyelitis. Electronically Signed   By: Inge Rise M.D.   On: 06/15/2018 15:20   Dg Foot Complete Right  Result Date: 06/15/2018 CLINICAL DATA:  Osteomyelitis of right foot. EXAM: RIGHT FOOT COMPLETE - 3+ VIEW COMPARISON:  MRI of same day.  Radiographs of January 10, 2018. FINDINGS: Status post surgical amputation of large portions of the fourth and fifth metatarsals and associated phalanges. Vascular calcifications are noted. Soft tissue ulcerations are seen lateral to the third metatarsophalangeal joint as well as the residual portion of the proximal fifth metatarsal. No definite lytic destruction is seen radiographically. IMPRESSION: Postsurgical changes as described above. No lytic destruction is seen radiographically to suggest osteomyelitis. Soft tissue ulcerations are noted. Electronically Signed   By: Marijo Conception, M.D.   On: 06/15/2018 15:20    EKG:   Orders placed or performed during the hospital encounter of 06/14/18  . EKG 12-Lead  . EKG 12-Lead  . ED EKG  . ED EKG  . EKG    ASSESSMENT AND PLAN:   32 year old male with past medical history significant for type 1  diabetes mellitus, neuropathy, gastroparesis comes in with bilateral feet pain and noted to have MRSA bacteremia and osteomyelitis.  1.  Osteomyelitis of bilateral fifth metatarsal and Achilles tendon tightening-appreciate podiatry and ID consult -Patient with MRSA bacteremia-repeat blood cultures negative so far -PICC line to be placed -Currently on vancomycin and Zosyn until final deep culture results are available.  And will need for 4 to 6 weeks of treatment -Patient is status post bony debridement and bilateral percutaneous Achilles lengthenings. -Nonweightbearing both legs, currently in splints. -Management per podiatry.  Physical therapy consult when appropriate -Continue pain control and antibiotics  2.  Type 1 diabetes mellitus-currently on Lantus, dose adjusted due to sugars in the 200 range -On aspart pre-meal insulin and sliding scale  3.  Depression-on Prozac, Xanax for anxiety  4.  Anemia of chronic disease-stable after surgery.  No indication for transfusion. -Continue iron supplements  5.  Acute renal failure on CKD stage III, baseline creatinine seems to be around 1.7. -Continue IV fluids and monitor in a.m.  6.  DVT prophylaxis-start subcutaneous heparin   All the records are reviewed and case discussed with Care Management/Social Workerr. Management plans discussed with the patient, family and they are in agreement.  CODE STATUS: Full code  TOTAL TIME TAKING CARE OF THIS PATIENT: 37 minutes.   POSSIBLE D/C IN 2-3 DAYS, DEPENDING ON CLINICAL CONDITION.   Gladstone Lighter  M.D on 06/17/2018 at 12:52 PM  Between 7am to 6pm - Pager - [phone]  After 6pm go to [email or web address] - password [email or web address] [facility]  Office  [phone]  CC: Primary care physician; Valera Castle, MD

## 2018-06-17 NOTE — Progress Notes (Signed)
  [facility] Pod[facility]                                                      Patient Demographics  Shawn [name], is a 32 y.o. male   MRN: [id number]   DOB - 1986/04/06  Admit Date - 06/14/2018    Outpatient Primary MD for the patient is Olmedo, Guy Begin, MD  Consult requested in the Hospital by Gladstone Lighter, MD, On 06/17/2018   Physical Exam: He is alert and well-oriented this morning states he is having some pain but he is on pain meds at this point. Vitals  Blood pressure (!) 144/97, pulse (!) 103, temperature 98.6 F (37 C), temperature source Oral, resp. rate 20, height 5\' 6"  (1.676 m), weight 52.2 kg, SpO2 99 %.  Lower Extremity exam: Dressings and splints are intact and are stable  at this point.  No bleeding through.  Ankles are mostly at her right ankle which is where I wanted him in order to prevent tightening of the Achilles lengthening procedures. Data Review  CBC Recent Labs  Lab 06/14/18 1408 06/15/18 0512 06/16/18 0355  WBC 21.2* 12.3* 6.2  HGB 11.5* 11.2* 9.5*  HCT 35.2* 35.3* 30.5*  PLT 404* 333 245  MCV 79.6* 81.5 83.6  MCH 26.0 25.9* 26.0  MCHC 32.7 31.7 31.1  RDW 13.9 13.8 13.8  LYMPHSABS 1.2  --   --   MONOABS 1.2*  --   --   EOSABS 0.0  --   --   BASOSABS 0.1  --   --    --------------------------------------------------------------------------------------------- Assessment & Plan: His white count has drastically improved down to 6.2 is doing well.  Surgery went well yesterday with bilateral percutaneous Achilles tendon lengthenings as well as section of the bone proliferations laterally that were associated with the ulcers and the osteomyelitis.  Right now leave his dressings intact leave the splints on prior take a look and tomorrow and see how well they are doing.  Active Problems:   Sepsis [facility])   Family  Communication: Plan discussed with patient   Albertine Patricia M.D on 06/17/2018 at 9:41 AM  Thank you for the consult, we will follow the patient with you in the Hospital.

## 2018-06-17 NOTE — Progress Notes (Signed)
[facility] INFECTIOUS DISEASE PROGRESS NOTE Date of Admission:  06/14/2018     ID: LAURA RADILLA is a 32 y.o. male with diabetic foot infection Active Problems:   Sepsis ([place])   Subjective: No fevers, had surgery yesterday. Some foot pain  ROS  Eleven systems are reviewed and negative except per hpi  Medications:  Antibiotics Given (last 72 hours)    Date/Time Action Medication Dose Rate   06/14/18 1720 New Bag/Given   aztreonam (AZACTAM) 2 g in sodium chloride 0.9 % 100 mL IVPB 2 g 200 mL/hr   06/14/18 1755 New Bag/Given   vancomycin (VANCOCIN) IVPB 1000 mg/200 mL premix 1,000 mg 200 mL/hr   06/14/18 2324 New Bag/Given   Ampicillin-Sulbactam (UNASYN) 3 g in sodium chloride 0.9 % 100 mL IVPB 3 g 200 mL/hr   06/15/18 0512 New Bag/Given   Ampicillin-Sulbactam (UNASYN) 3 g in sodium chloride 0.9 % 100 mL IVPB 3 g 200 mL/hr   06/15/18 0909 New Bag/Given   vancomycin (VANCOCIN) IVPB 750 mg/150 ml premix 750 mg 150 mL/hr   06/15/18 1345 New Bag/Given   piperacillin-tazobactam (ZOSYN) IVPB 3.375 g 3.375 g 12.5 mL/hr   06/15/18 2113 New Bag/Given   piperacillin-tazobactam (ZOSYN) IVPB 3.375 g 3.375 g 12.5 mL/hr   06/16/18 0435 New Bag/Given   vancomycin (VANCOCIN) IVPB 750 mg/150 ml premix 750 mg 150 mL/hr   06/16/18 4098 New Bag/Given   piperacillin-tazobactam (ZOSYN) IVPB 3.375 g 3.375 g 12.5 mL/hr   06/16/18 1642 New Bag/Given   piperacillin-tazobactam (ZOSYN) IVPB 3.375 g 3.375 g 12.5 mL/hr   06/16/18 2148 New Bag/Given   vancomycin (VANCOCIN) IVPB 750 mg/150 ml premix 750 mg 150 mL/hr   06/16/18 2258 New Bag/Given   piperacillin-tazobactam (ZOSYN) IVPB 3.375 g 3.375 g 12.5 mL/hr   06/17/18 1191 New Bag/Given   piperacillin-tazobactam (ZOSYN) IVPB 3.375 g 3.375 g 12.5 mL/hr     . ferrous sulfate  325 mg Oral BID WC  . FLUoxetine  20 mg Oral QHS  . insulin aspart  0-5 Units Subcutaneous QHS  . insulin aspart  0-9 Units Subcutaneous TID WC  . insulin aspart  4 Units  Subcutaneous TID WC  . insulin glargine  20 Units Subcutaneous QHS    Objective: Vital signs in last 24 hours: Temp:  [97.1 F (36.2 C)-98.9 F (37.2 C)] 98.6 F (37 C) (12/14 0520) Pulse Rate:  [88-107] 103 (12/14 0520) Resp:  [10-20] 20 (12/14 0520) BP: (109-157)/(72-107) 144/97 (12/14 0520) SpO2:  [95 %-100 %] 99 % (12/14 0520) Constitutional:Thin, chronically ill appearing HENT:anicteric Mouth/Throat: Oropharynx is clear and moist. No oropharyngeal exudate.  Cardiovascular: Normal rate, regular rhythm and normal heart sounds. Pulmonary/Chest: Effort normal and breath sounds normal. No respiratory distress. He has no wheezes.  Abdominal: Soft. Bowel sounds are normal. He exhibits no distension. There is no tenderness.  Lymphadenopathy: He has no cervical adenopathy.  Neurological: He is alert and oriented to person, place, and time.  Skin:bilateral feet wrapped Psychiatric: He has a normal mood and affect. His behavior is normal.   Lab Results Recent Labs    06/15/18 0512 06/16/18 0355  WBC 12.3* 6.2  HGB 11.2* 9.5*  HCT 35.3* 30.5*  NA 134* 135  K 4.0 3.3*  CL 99 103  CO2 26 26  BUN 20 22*  CREATININE 1.73* 1.98*    Microbiology: Results for orders placed or performed during the hospital encounter of 06/14/18  Blood culture (single)     Status: Abnormal  Collection Time: 06/14/18  2:09 PM  Result Value Ref Range Status   Specimen Description   Final    BLOOD RIGHT ANTECUBITAL Performed at [facility], [address] [place], [place] [address]    Special Requests   Final    BOTTLES DRAWN AEROBIC AND ANAEROBIC Blood Culture adequate volume Performed at [facility], [address] [place], [place] [address]    Culture  Setup Time   Final    GRAM POSITIVE COCCI AEROBIC BOTTLE ONLY CRITICAL RESULT CALLED TO, READ BACK BY AND VERIFIED WITH:  [facility] SLAUGHTER AT 1030 06/15/18 SDR Performed at [facility], [place] [address]  [place], [place] [address]    Culture METHICILLIN RESISTANT STAPHYLOCOCCUS AUREUS (A)  Final   Report Status 06/17/2018 FINAL  Final   Organism ID, Bacteria METHICILLIN RESISTANT STAPHYLOCOCCUS AUREUS  Final      Susceptibility   Methicillin resistant staphylococcus aureus - MIC*    CIPROFLOXACIN <=0.5 SENSITIVE Sensitive     ERYTHROMYCIN 1 INTERMEDIATE Intermediate     GENTAMICIN <=0.5 SENSITIVE Sensitive     OXACILLIN >=4 RESISTANT Resistant     TETRACYCLINE <=1 SENSITIVE Sensitive     VANCOMYCIN <=0.5 SENSITIVE Sensitive     TRIMETH/SULFA <=10 SENSITIVE Sensitive     CLINDAMYCIN <=0.25 SENSITIVE Sensitive     RIFAMPIN <=0.5 SENSITIVE Sensitive     Inducible Clindamycin NEGATIVE Sensitive     * METHICILLIN RESISTANT STAPHYLOCOCCUS AUREUS  Blood Culture ID Panel (Reflexed)     Status: Abnormal   Collection Time: 06/14/18  2:09 PM  Result Value Ref Range Status   Enterococcus species NOT DETECTED NOT DETECTED Final   Listeria monocytogenes NOT DETECTED NOT DETECTED Final   Staphylococcus species DETECTED (A) NOT DETECTED Final    Comment: CRITICAL RESULT CALLED TO, READ BACK BY AND VERIFIED WITH:  MYRA SLAUGHTER AT 1030 06/15/18 SDR    Staphylococcus aureus (BCID) DETECTED (A) NOT DETECTED Final    Comment: Methicillin (oxacillin)-resistant Staphylococcus aureus (MRSA). MRSA is predictably resistant to beta-lactam antibiotics (except ceftaroline). Preferred therapy is vancomycin unless clinically contraindicated. Patient requires contact precautions if  hospitalized. CRITICAL RESULT CALLED TO, READ BACK BY AND VERIFIED WITH:  MYRA SLAUGHTER AT 1030 06/15/18 SDR    Methicillin resistance DETECTED (A) NOT DETECTED Final    Comment: CRITICAL RESULT CALLED TO, READ BACK BY AND VERIFIED WITH:  MYRA SLAUGHTER AT 1030 06/15/18 SDR    Streptococcus species NOT DETECTED NOT DETECTED Final   Streptococcus agalactiae NOT DETECTED NOT DETECTED Final   Streptococcus pneumoniae NOT DETECTED NOT  DETECTED Final   Streptococcus pyogenes NOT DETECTED NOT DETECTED Final   Acinetobacter baumannii NOT DETECTED NOT DETECTED Final   Enterobacteriaceae species NOT DETECTED NOT DETECTED Final   Enterobacter cloacae complex NOT DETECTED NOT DETECTED Final   Escherichia coli NOT DETECTED NOT DETECTED Final   Klebsiella oxytoca NOT DETECTED NOT DETECTED Final   Klebsiella pneumoniae NOT DETECTED NOT DETECTED Final   Proteus species NOT DETECTED NOT DETECTED Final   Serratia marcescens NOT DETECTED NOT DETECTED Final   Haemophilus influenzae NOT DETECTED NOT DETECTED Final   Neisseria meningitidis NOT DETECTED NOT DETECTED Final   Pseudomonas aeruginosa NOT DETECTED NOT DETECTED Final   Candida albicans NOT DETECTED NOT DETECTED Final   Candida glabrata NOT DETECTED NOT DETECTED Final   Candida krusei NOT DETECTED NOT DETECTED Final   Candida parapsilosis NOT DETECTED NOT DETECTED Final   Candida tropicalis NOT DETECTED NOT DETECTED Final  Comment: Performed at [facility], [address] [place], [place] [address]  Blood Culture (routine x 2)     Status: None (Preliminary result)   Collection Time: 06/14/18  5:01 PM  Result Value Ref Range Status   Specimen Description BLOOD BLOOD LEFT HAND  Final   Special Requests   Final    BOTTLES DRAWN AEROBIC AND ANAEROBIC Blood Culture results may not be optimal due to an inadequate volume of blood received in culture bottles   Culture   Final    NO GROWTH 3 DAYS Performed at [facility], [address] [place], [place] [address]    Report Status PENDING  Incomplete  Blood Culture (routine x 2)     Status: None (Preliminary result)   Collection Time: 06/14/18  5:01 PM  Result Value Ref Range Status   Specimen Description BLOOD BLOOD RIGHT HAND  Final   Special Requests   Final    BOTTLES DRAWN AEROBIC AND ANAEROBIC Blood Culture results may not be optimal due to an inadequate volume of blood received in culture bottles    Culture   Final    NO GROWTH 3 DAYS Performed at [facility], [address], [place], [place] [address]    Report Status PENDING  Incomplete  Culture, blood (single) w Reflex to ID Panel     Status: None (Preliminary result)   Collection Time: 06/15/18 11:53 AM  Result Value Ref Range Status   Specimen Description BLOOD BLOOD LEFT FOREARM  Final   Special Requests   Final    BOTTLES DRAWN AEROBIC AND ANAEROBIC Blood Culture adequate volume   Culture   Final    NO GROWTH 2 DAYS Performed at [facility], [address] [place], [place] [address]    Report Status PENDING  Incomplete  Aerobic/Anaerobic Culture (surgical/deep wound)     Status: None (Preliminary result)   Collection Time: 06/16/18 12:59 PM  Result Value Ref Range Status   Specimen Description   Final    BONE FOOT RIGHT Performed at [facility], [address], [place], [place] [address]    Special Requests   Final    NONE Performed at [facility], [address] [place], [place] [address]    Gram Stain   Final    RARE WBC SEEN RARE [place] Results Called to: Delena Serve 06/16/18 1410 JGF/SDR Performed at [facility], [address], [place], [place] [address]    Culture   Final    RARE WBC PRESENT, PREDOMINANTLY PMN NO ORGANISMS SEEN Performed at [facility], [place] [address] [place], [place] [address]    Report Status PENDING  Incomplete  Aerobic/Anaerobic Culture (surgical/deep wound)     Status: None (Preliminary result)   Collection Time: 06/16/18  1:21 PM  Result Value Ref Range Status   Specimen Description   Final    BONE Performed at [facility], [address] [place], [place] [address]    Special Requests   Final    LEFT FOOT Performed at [facility], [address] [place], [place] [address]    Gram Stain   Final    RARE WBC PRESENT, PREDOMINANTLY MONONUCLEAR NO ORGANISMS SEEN Performed  at [facility], [place] [address], [place],  [address]    Culture PENDING  Incomplete   Report Status PENDING  Incomplete    Studies/Results: Dg Foot Complete Left  Result Date: 06/15/2018  CLINICAL DATA:  Left foot pain. History of prior amputation for osteomyelitis. EXAM: LEFT FOOT - COMPLETE 3+ VIEW COMPARISON:  Plain films left foot 04/05/2018. FINDINGS: Since the prior examination, the patient has undergone amputation of the fourth and fifth rays at the level of the proximal metatarsals. The patient has a fracture of the neck of the third metatarsal with extensive periosteal new bone formation about the fracture. Margin of the amputation site of the fifth metatarsal has an irregular appearance. Fourth metatarsal amputation site appears corticated with some periosteal new bone formation present. A small soft tissue wound is noted over the fifth metatarsal stump. Soft tissues of the foot appear swollen. IMPRESSION: Soft tissue wound on the lateral aspect of the foot with findings worrisome for osteomyelitis in the distal aspect of the fifth metatarsal remnant. Fracture of the third metatarsal neck with periosteal new bone formation. The fracture can not be definitively characterized but could be due to osteomyelitis. Electronically Signed   By: Inge Rise M.D.   On: 06/15/2018 15:20   Dg Foot Complete Right  Result Date: 06/15/2018 CLINICAL DATA:  Osteomyelitis of right foot. EXAM: RIGHT FOOT COMPLETE - 3+ VIEW COMPARISON:  MRI of same day.  Radiographs of January 10, 2018. FINDINGS: Status post surgical amputation of large portions of the fourth and fifth metatarsals and associated phalanges. Vascular calcifications are noted. Soft tissue ulcerations are seen lateral to the third metatarsophalangeal joint as well as the residual portion of the proximal fifth metatarsal. No definite lytic destruction is seen radiographically. IMPRESSION: Postsurgical changes as described above. No lytic  destruction is seen radiographically to suggest osteomyelitis. Soft tissue ulcerations are noted. Electronically Signed   By: Marijo Conception, M.D.   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 He was treated with IV unasyn from 10/2- 11//15/19 for L foot infection. Then received 2 weeks PO augmentin. The infections have certainly progressed since that time and now have identified MRSA. 12/13 - fu bcx 12/12 ngtd, no fevers. TTE neg endocarditis but does have possible thrombus Lateral wall of LV. Wbc down 12/14- s/p surgery 12/13 with bil achilles lengthing, resection of 5th MT base, anchor of tendon and debridlement of ulcerations. Cultures pending.  Recommendations MRSA bacteremia - Continue vancomycin  FU cx 12/2 negative- can place picc 1/15 Can hold on TEE unless need to eval the possible LV thrombus.  Monitor for sites of metastatic infection or emboli  DM foot infection and osteomyelitis- bilateral. s/p Surgery 12/13 Contzosynfor pseudomonal coverage and anaerobic coverage. Deep cultures pending and will decide on long term abx treatment based on these Hopefully will just need Vanco IV for 4-6 weeks  Thank you very much for the consult. Will follow with you.  Leonel Ramsay   06/17/2018, 9:59 AM

## 2018-06-18 LAB — CBC
HCT: 25.4 % — ABNORMAL LOW (ref 39.0–52.0)
Hemoglobin: 7.9 g/dL — ABNORMAL LOW (ref 13.0–17.0)
MCH: 25.8 pg — AB (ref 26.0–34.0)
MCHC: 31.1 g/dL (ref 30.0–36.0)
MCV: 83 fL (ref 80.0–100.0)
Platelets: 213 10*3/uL (ref 150–400)
RBC: 3.06 MIL/uL — ABNORMAL LOW (ref 4.22–5.81)
RDW: 13.7 % (ref 11.5–15.5)
WBC: 8 10*3/uL (ref 4.0–10.5)
nRBC: 0 % (ref 0.0–0.2)

## 2018-06-18 LAB — BASIC METABOLIC PANEL
Anion gap: 8 (ref 5–15)
BUN: 23 mg/dL — ABNORMAL HIGH (ref 6–20)
CALCIUM: 7.6 mg/dL — AB (ref 8.9–10.3)
CO2: 22 mmol/L (ref 22–32)
Chloride: 107 mmol/L (ref 98–111)
Creatinine, Ser: 1.63 mg/dL — ABNORMAL HIGH (ref 0.61–1.24)
GFR calc Af Amer: >60 mL/min (ref >60)
GFR calc non Af Amer: 56 mL/min — ABNORMAL LOW (ref >60)
Glucose, Bld: 154 mg/dL — ABNORMAL HIGH (ref 70–99)
Potassium: 3.1 mmol/L — ABNORMAL LOW (ref 3.5–5.1)
Sodium: 137 mmol/L (ref 135–145)

## 2018-06-18 LAB — GLUCOSE, CAPILLARY
Glucose-Capillary: 146 mg/dL — ABNORMAL HIGH (ref 70–99)
Glucose-Capillary: 109 mg/dL — ABNORMAL HIGH (ref 70–99)

## 2018-06-18 MED ORDER — POTASSIUM CHLORIDE CRYS ER 20 MEQ PO TBCR
40.0000 meq | EXTENDED_RELEASE_TABLET | ORAL | Status: AC
  Administered 2018-06-18 (×2): 40 meq via ORAL
  Filled 2018-06-18 (×2): qty 2

## 2018-06-18 NOTE — Progress Notes (Signed)
[facility] Podiatry                                                      Patient Demographics  Shawn [name], is a 32 y.o. male   MRN: [id number]   DOB - 16-Nov-1985  Admit Date - 06/14/2018    Outpatient Primary MD for the patient is Olmedo, Guy Begin, MD  Consult requested in the Hospital by Gladstone Lighter, MD, On 06/18/2018  With History of -  Past Medical History:  Diagnosis Date  . CKD (chronic kidney disease)   . Diabetes mellitus without complication ([place])   . GERD (gastroesophageal reflux disease)   . IBS (irritable bowel syndrome)   . Osteomyelitis [facility])       Past Surgical History:  Procedure Laterality Date  . ABDOMINAL AORTOGRAM W/LOWER EXTREMITY Right 12/23/2017   Procedure: ABDOMINAL AORTOGRAM W/LOWER EXTREMITY;  Surgeon: Katha Cabal, MD;  Location: [facility];  Service: Cardiovascular;  Laterality: Right;  . ACHILLES TENDON SURGERY Bilateral 06/16/2018   Procedure: ACHILLES LENGTHENING/KIDNER;  Surgeon: Albertine Patricia, DPM;  Location: [facility];  Service: Podiatry;  Laterality: Bilateral;  . AMPUTATION Right 12/24/2017   Procedure: AMPUTATION RAY;  Surgeon: Sharlotte Alamo, DPM;  Location: [facility];  Service: Podiatry;  Laterality: Right;  . AMPUTATION Left 04/06/2018   Procedure: AMPUTATION RAY;  Surgeon: Sharlotte Alamo, DPM;  Location: [facility];  Service: Podiatry;  Laterality: Left;  . AMPUTATION Left 04/09/2018   Procedure: AMPUTATION RAY;  Surgeon: Sharlotte Alamo, DPM;  Location: [facility];  Service: Podiatry;  Laterality: Left;  . APPLICATION OF WOUND VAC Right 12/24/2017   Procedure: APPLICATION OF WOUND VAC;  Surgeon: Sharlotte Alamo, DPM;  Location: [facility];  Service: Podiatry;  Laterality: Right;  . APPLICATION OF WOUND VAC Right 01/01/2018   Procedure: APPLICATION OF WOUND VAC;  Surgeon: Albertine Patricia, DPM;  Location:  [facility];  Service: Podiatry;  Laterality: Right;  . BONE EXCISION Bilateral 06/16/2018   Procedure: BONE EXCISION AND SOFT TISSUE;  Surgeon: Albertine Patricia, DPM;  Location: [facility];  Service: Podiatry;  Laterality: Bilateral;  . IRRIGATION AND DEBRIDEMENT FOOT Right 12/20/2017   Procedure: IRRIGATION AND DEBRIDEMENT FOOT;  Surgeon: Sharlotte Alamo, DPM;  Location: [facility];  Service: Podiatry;  Laterality: Right;  . IRRIGATION AND DEBRIDEMENT FOOT Right 12/24/2017   Procedure: IRRIGATION AND DEBRIDEMENT FOOT;  Surgeon: Sharlotte Alamo, DPM;  Location: [facility];  Service: Podiatry;  Laterality: Right;  . IRRIGATION AND DEBRIDEMENT FOOT Right 01/01/2018   Procedure: IRRIGATION AND DEBRIDEMENT FOOT-SKIN,SOFT TISSUE AND BONE;  Surgeon: Albertine Patricia, DPM;  Location: [facility];  Service: Podiatry;  Laterality: Right;  . IRRIGATION AND DEBRIDEMENT FOOT Left 04/06/2018   Procedure: IRRIGATION AND DEBRIDEMENT FOOT;  Surgeon: Sharlotte Alamo, DPM;  Location: [facility];  Service: Podiatry;  Laterality: Left;  . LOWER EXTREMITY ANGIOGRAPHY Left 04/06/2018   Procedure: Lower Extremity Angiography;  Surgeon: Algernon Huxley, MD;  Location: [facility];  Service: Cardiovascular;  Laterality: Left;    in for   Chief Complaint  Patient presents with  . Tachycardia  . Nausea  . Foot Pain  . Emesis     HPI  Shawn [name]  is a 32 y.o. male, 2 days status post percutaneous Achilles lengthening also resection of prominent infected bone from both fifth metatarsal  bases.  Has a chronic history of ulcerations at the fourth fifth rays amputated in the past but he still continue to put excessive pressure and stress on these regions.    Review of Systems    In addition to the HPI above,  No Fever-chills, No Headache, No changes with Vision or hearing, No problems swallowing food or Liquids, No Chest pain, Cough or Shortness of Breath, No Abdominal pain, No Nausea or Vommitting, Bowel movements are  regular, No Blood in stool or Urine, No dysuria, No new skin rashes or bruises, No new joints pains-aches,  No new weakness, tingling, numbness in any extremity, No recent weight gain or loss, No polyuria, polydypsia or polyphagia, No significant Mental Stressors.  A full 10 point Review of Systems was done, except as stated above, all other Review of Systems were negative.   Social History Social History   Tobacco Use  . Smoking status: Never Smoker  . Smokeless tobacco: Never Used  Substance Use Topics  . Alcohol use: No    Frequency: Never    Family History Family History  Problem Relation Age of Onset  . Diabetes Mother   . Ovarian cancer Mother   . Healthy Father     Prior to Admission medications   Medication Sig Start Date End Date Taking? Authorizing Provider  ALPRAZolam (XANAX) 0.25 MG tablet Take 1 tablet (0.25 mg total) by mouth 3 (three) times daily as needed for anxiety. 12/27/17  Yes Loletha Grayer, MD  diphenoxylate-atropine (LOMOTIL) 2.5-0.025 MG tablet Take 1 tablet by mouth 4 (four) times daily as needed for diarrhea or loose stools. 04/11/18 04/11/19 Yes Henreitta Leber, MD  ferrous sulfate 325 (65 FE) MG tablet Take 1 tablet (325 mg total) by mouth 2 (two) times daily with a meal. 01/03/18  Yes Sainani, Belia Heman, MD  insulin glargine (LANTUS) 100 UNIT/ML injection Inject 0.2 mLs (20 Units total) into the skin at bedtime. 04/11/18  Yes Sainani, Belia Heman, MD  Insulin Syringes, Disposable, U-100 0.3 ML MISC 1 Syringe by Does not apply route 4 (four) times daily -  with meals and at bedtime. 12/27/17  Yes Wieting, Richard, MD  lisinopril (PRINIVIL,ZESTRIL) 20 MG tablet Take 20 mg by mouth 2 (two) times daily. 05/25/18 05/25/19 Yes [provider]  NOVOLIN R RELION 100 UNIT/ML injection Inject 0-0.1 mLs (0-10 Units total) into the skin 3 (three) times daily as needed for high blood sugar (sliding scale). 05/02/18 07/01/18 Yes Wieting, Richard, MD   oxyCODONE-acetaminophen (PERCOCET/ROXICET) 5-325 MG tablet Take 1 tablet by mouth every 6 (six) hours as needed. Patient taking differently: Take 1-2 tablets by mouth See admin instructions. Take 1 to 2 tablets by mouth every 4 to 6 hours as needed for pain 05/02/18  Yes Wieting, Richard, MD  potassium chloride SA (K-DUR,KLOR-CON) 20 MEQ tablet Take 20 mEq by mouth 2 (two) times daily.   Yes [provider]  amoxicillin-clavulanate (AUGMENTIN) 875-125 MG tablet Take 1 tablet by mouth 2 (two) times daily. Patient not taking: Reported on 06/14/2018 05/16/18   Tsosie Billing, MD  FLUoxetine (PROZAC) 20 MG capsule Take 1 capsule (20 mg total) by mouth at bedtime. Patient not taking: Reported on 06/14/2018 05/02/18   Loletha Grayer, MD    Anti-infectives (From admission, onward)   Start     Dose/Rate Route Frequency Ordered Stop   06/15/18 1400  metroNIDAZOLE (FLAGYL) tablet 500 mg  Status:  Discontinued     500 mg Oral Every 8 hours 06/15/18  1133 06/15/18 1204   06/15/18 1400  piperacillin-tazobactam (ZOSYN) IVPB 3.375 g     3.375 g 12.5 mL/hr over 240 Minutes Intravenous Every 8 hours 06/15/18 1215     06/15/18 1000  vancomycin (VANCOCIN) IVPB 750 mg/150 ml premix     750 mg 150 mL/hr over 60 Minutes Intravenous Every 18 hours 06/14/18 1842     06/15/18 0000  Ampicillin-Sulbactam (UNASYN) 3 g in sodium chloride 0.9 % 100 mL IVPB  Status:  Discontinued     3 g 200 mL/hr over 30 Minutes Intravenous Every 6 hours 06/14/18 1851 06/15/18 1133   06/14/18 1700  vancomycin (VANCOCIN) IVPB 1000 mg/200 mL premix     1,000 mg 200 mL/hr over 60 Minutes Intravenous  Once 06/14/18 1655 06/14/18 1855   06/14/18 1700  aztreonam (AZACTAM) 2 g in sodium chloride 0.9 % 100 mL IVPB     2 g 200 mL/hr over 30 Minutes Intravenous  Once 06/14/18 1655 06/14/18 1754      Scheduled Meds: . ferrous sulfate  325 mg Oral BID WC  . FLUoxetine  20 mg Oral QHS  . heparin injection (subcutaneous)   5,000 Units Subcutaneous Q8H  . insulin aspart  0-5 Units Subcutaneous QHS  . insulin aspart  0-9 Units Subcutaneous TID WC  . insulin aspart  4 Units Subcutaneous TID WC  . insulin glargine  25 Units Subcutaneous QHS   Continuous Infusions: . sodium chloride 125 mL/hr at 06/18/18 0533  . sodium chloride 10 mL/hr at 06/16/18 1009  . piperacillin-tazobactam (ZOSYN)  IV 3.375 g (06/18/18 0534)  . vancomycin 750 mg (06/18/18 1002)   PRN Meds:.sodium chloride, acetaminophen **OR** acetaminophen, ALPRAZolam, diphenoxylate-atropine, HYDROmorphone (DILAUDID) injection, labetalol, LORazepam, ondansetron **OR** ondansetron (ZOFRAN) IV, oxyCODONE, promethazine, zolpidem  Allergies  Allergen Reactions  . Banana Hives, Nausea And Vomiting and Rash  . Keflex [Cephalexin] Rash    No swelling- also taken penicillin without any issue.  . Onion Hives, Nausea And Vomiting and Rash  . Sulfa Antibiotics Anaphylaxis  . Grapeseed Extract [Nutritional Supplements] Itching  . Shellfish Allergy Hives    "ALL SEAFOOD"    Physical Exam  Vitals  Blood pressure 122/87, pulse 87, temperature 97.6 F (36.4 C), temperature source Oral, resp. rate 20, height 5\' 6"  (1.676 m), weight 52.2 kg, SpO2 98 %.  Lower Extremity exam: Dressings changed today and overall he is doing fairly well.  Both wounds look stable with no evidence of any drainage or infection at this point.  Percutaneous incisions for the Achilles are all stable look to be in good shape as well.  There is no over the third ray right foot looks stable and appears to be healing nicely also. Data Review  CBC Recent Labs  Lab 06/14/18 1408 06/15/18 0512 06/16/18 0355 06/18/18 0403  WBC 21.2* 12.3* 6.2 8.0  HGB 11.5* 11.2* 9.5* 7.9*  HCT 35.2* 35.3* 30.5* 25.4*  PLT 404* 333 245 213  MCV 79.6* 81.5 83.6 83.0  MCH 26.0 25.9* 26.0 25.8*  MCHC 32.7 31.7 31.1 31.1  RDW 13.9 13.8 13.8 13.7  LYMPHSABS 1.2  --   --   --   MONOABS 1.2*  --   --   --    EOSABS 0.0  --   --   --   BASOSABS 0.1  --   --   --    ------------------------------------------------------------------------------------------------------------------  Assessment & Plan: Overall he is pretty stable had a discussion with him today he is going to have  to go home states he needs help take care of his son.  Ideally I would keep him completely nonweightbearing for about 5 or 6 weeks but he is incapable of doing this.  In spite of suggestions of home care physical therapy etc. he is insistent that he is going to have to put some weight on his feet to get up and go the bathroom and help his son.  What I will likely have to do is put him in bilateral Cam walking boots and have him just get up to go the bathroom and to move from bed to chair.  He should be able to use a walker with that.  I will get physical therapy to work with him tomorrow regarding that I will see if we can order some tall Cam walking boots for both of his feet and legs.  Active Problems:   Sepsis [facility])   Family Communication: Plan discussed with patient Dr. Melina Copa M.D on 06/18/2018 at 10:18 AM  Thank you for the consult, we will follow the patient with you in the Hospital.

## 2018-06-18 NOTE — Progress Notes (Signed)
[place] INFECTIOUS DISEASE PROGRESS NOTE Date of Admission:  06/14/2018     ID: Shawn [name] is a 32 y.o. male with diabetic foot infection Active Problems:   Sepsis ([place])   Subjective: No fevers, had surgery yesterday. Some foot pain  ROS  Eleven systems are reviewed and negative except per hpi  Medications:  Antibiotics Given (last 72 hours)    Date/Time Action Medication Dose Rate   06/15/18 2113 New Bag/Given   piperacillin-tazobactam (ZOSYN) IVPB 3.375 g 3.375 g 12.5 mL/hr   06/16/18 0435 New Bag/Given   vancomycin (VANCOCIN) IVPB 750 mg/150 ml premix 750 mg 150 mL/hr   06/16/18 0608 New Bag/Given   piperacillin-tazobactam (ZOSYN) IVPB 3.375 g 3.375 g 12.5 mL/hr   06/16/18 1642 New Bag/Given   piperacillin-tazobactam (ZOSYN) IVPB 3.375 g 3.375 g 12.5 mL/hr   06/16/18 2148 New Bag/Given   vancomycin (VANCOCIN) IVPB 750 mg/150 ml premix 750 mg 150 mL/hr   06/16/18 2258 New Bag/Given   piperacillin-tazobactam (ZOSYN) IVPB 3.375 g 3.375 g 12.5 mL/hr   06/17/18 1027 New Bag/Given   piperacillin-tazobactam (ZOSYN) IVPB 3.375 g 3.375 g 12.5 mL/hr   06/17/18 1628 New Bag/Given   piperacillin-tazobactam (ZOSYN) IVPB 3.375 g 3.375 g 12.5 mL/hr   06/17/18 2147 New Bag/Given   piperacillin-tazobactam (ZOSYN) IVPB 3.375 g 3.375 g 11.75 mL/hr   06/18/18 0534 New Bag/Given   piperacillin-tazobactam (ZOSYN) IVPB 3.375 g 3.375 g 12.5 mL/hr   06/18/18 1002 New Bag/Given   vancomycin (VANCOCIN) IVPB 750 mg/150 ml premix 750 mg 150 mL/hr     . ferrous sulfate  325 mg Oral BID WC  . FLUoxetine  20 mg Oral QHS  . heparin injection (subcutaneous)  5,000 Units Subcutaneous Q8H  . insulin aspart  0-5 Units Subcutaneous QHS  . insulin aspart  0-9 Units Subcutaneous TID WC  . insulin aspart  4 Units Subcutaneous TID WC  . insulin glargine  25 Units Subcutaneous QHS  . potassium chloride  40 mEq Oral Q4H    Objective: Vital signs in last 24 hours: Temp:  [97.6 F (36.4  C)-98.5 F (36.9 C)] 98.5 F (36.9 C) (12/15 1158) Pulse Rate:  [87-99] 98 (12/15 1158) Resp:  [16-20] 16 (12/15 1158) BP: (122-133)/(87-95) 131/91 (12/15 1158) SpO2:  [97 %-98 %] 98 % (12/15 1158) Constitutional:Thin, chronically ill appearing HENT:anicteric Mouth/Throat: Oropharynx is clear and moist. No oropharyngeal exudate.  Cardiovascular: Normal rate, regular rhythm and normal heart sounds. Pulmonary/Chest: Effort normal and breath sounds normal. No respiratory distress. He has no wheezes.  Abdominal: Soft. Bowel sounds are normal. He exhibits no distension. There is no tenderness.  Lymphadenopathy: He has no cervical adenopathy.  Neurological: He is alert and oriented to person, place, and time.  Skin:bilateral feet wrapped Psychiatric: He has a normal mood and affect. His behavior is normal.   Lab Results Recent Labs    06/16/18 0355 06/18/18 0403  WBC 6.2 8.0  HGB 9.5* 7.9*  HCT 30.5* 25.4*  NA 135 137  K 3.3* 3.1*  CL 103 107  CO2 26 22  BUN 22* 23*  CREATININE 1.98* 1.63*    Microbiology: Results for orders placed or performed during the hospital encounter of 06/14/18  Blood culture (single)     Status: Abnormal   Collection Time: 06/14/18  2:09 PM  Result Value Ref Range Status   Specimen Description   Final    BLOOD RIGHT ANTECUBITAL Performed at [facility], [address] [place], [place] [address]  Special Requests   Final    BOTTLES DRAWN AEROBIC AND ANAEROBIC Blood Culture adequate volume Performed at [facility], [address] [place], [place] [address]    Culture  Setup Time   Final    GRAM POSITIVE COCCI AEROBIC BOTTLE ONLY CRITICAL RESULT CALLED TO, READ BACK BY AND VERIFIED WITH:  [place] AT 1030 06/15/18 SDR Performed at [facility], [place] [address] [place], [place] [address]    Culture METHICILLIN RESISTANT STAPHYLOCOCCUS AUREUS (A)  Final   Report Status 06/17/2018 FINAL  Final   Organism  ID, Bacteria METHICILLIN RESISTANT STAPHYLOCOCCUS AUREUS  Final      Susceptibility   Methicillin resistant staphylococcus aureus - MIC*    CIPROFLOXACIN <=0.5 SENSITIVE Sensitive     ERYTHROMYCIN 1 INTERMEDIATE Intermediate     GENTAMICIN <=0.5 SENSITIVE Sensitive     OXACILLIN >=4 RESISTANT Resistant     TETRACYCLINE <=1 SENSITIVE Sensitive     VANCOMYCIN <=0.5 SENSITIVE Sensitive     TRIMETH/SULFA <=10 SENSITIVE Sensitive     CLINDAMYCIN <=0.25 SENSITIVE Sensitive     RIFAMPIN <=0.5 SENSITIVE Sensitive     Inducible Clindamycin NEGATIVE Sensitive     * METHICILLIN RESISTANT STAPHYLOCOCCUS AUREUS  Blood Culture ID Panel (Reflexed)     Status: Abnormal   Collection Time: 06/14/18  2:09 PM  Result Value Ref Range Status   Enterococcus species NOT DETECTED NOT DETECTED Final   Listeria monocytogenes NOT DETECTED NOT DETECTED Final   Staphylococcus species DETECTED (A) NOT DETECTED Final    Comment: CRITICAL RESULT CALLED TO, READ BACK BY AND VERIFIED WITH:  MYRA SLAUGHTER AT 1030 06/15/18 SDR    Staphylococcus aureus (BCID) DETECTED (A) NOT DETECTED Final    Comment: Methicillin (oxacillin)-resistant Staphylococcus aureus (MRSA). MRSA is predictably resistant to beta-lactam antibiotics (except ceftaroline). Preferred therapy is vancomycin unless clinically contraindicated. Patient requires contact precautions if  hospitalized. CRITICAL RESULT CALLED TO, READ BACK BY AND VERIFIED WITH:  MYRA SLAUGHTER AT 1030 06/15/18 SDR    Methicillin resistance DETECTED (A) NOT DETECTED Final    Comment: CRITICAL RESULT CALLED TO, READ BACK BY AND VERIFIED WITH:  MYRA SLAUGHTER AT 1030 06/15/18 SDR    Streptococcus species NOT DETECTED NOT DETECTED Final   Streptococcus agalactiae NOT DETECTED NOT DETECTED Final   Streptococcus pneumoniae NOT DETECTED NOT DETECTED Final   Streptococcus pyogenes NOT DETECTED NOT DETECTED Final   Acinetobacter baumannii NOT DETECTED NOT DETECTED Final    Enterobacteriaceae species NOT DETECTED NOT DETECTED Final   Enterobacter cloacae complex NOT DETECTED NOT DETECTED Final   Escherichia coli NOT DETECTED NOT DETECTED Final   Klebsiella oxytoca NOT DETECTED NOT DETECTED Final   Klebsiella pneumoniae NOT DETECTED NOT DETECTED Final   Proteus species NOT DETECTED NOT DETECTED Final   Serratia marcescens NOT DETECTED NOT DETECTED Final   Haemophilus influenzae NOT DETECTED NOT DETECTED Final   Neisseria meningitidis NOT DETECTED NOT DETECTED Final   Pseudomonas aeruginosa NOT DETECTED NOT DETECTED Final   Candida albicans NOT DETECTED NOT DETECTED Final   Candida glabrata NOT DETECTED NOT DETECTED Final   Candida krusei NOT DETECTED NOT DETECTED Final   Candida parapsilosis NOT DETECTED NOT DETECTED Final   Candida tropicalis NOT DETECTED NOT DETECTED Final    Comment: Performed at [facility], [address] [place], [place] [address]  Blood Culture (routine x 2)     Status: None (Preliminary result)   Collection Time: 06/14/18  5:01 PM  Result Value  Ref Range Status   Specimen Description BLOOD BLOOD LEFT HAND  Final   Special Requests   Final    BOTTLES DRAWN AEROBIC AND ANAEROBIC Blood Culture results may not be optimal due to an inadequate volume of blood received in culture bottles   Culture   Final    NO GROWTH 4 DAYS Performed at [facility], [address], [place], [place] [address]    Report Status PENDING  Incomplete  Blood Culture (routine x 2)     Status: None (Preliminary result)   Collection Time: 06/14/18  5:01 PM  Result Value Ref Range Status   Specimen Description BLOOD BLOOD RIGHT HAND  Final   Special Requests   Final    BOTTLES DRAWN AEROBIC AND ANAEROBIC Blood Culture results may not be optimal due to an inadequate volume of blood received in culture bottles   Culture   Final    NO GROWTH 4 DAYS Performed at [facility], [address] [place], [place] [address]    Report  Status PENDING  Incomplete  Culture, blood (single) w Reflex to ID Panel     Status: None (Preliminary result)   Collection Time: 06/15/18 11:53 AM  Result Value Ref Range Status   Specimen Description BLOOD BLOOD LEFT FOREARM  Final   Special Requests   Final    BOTTLES DRAWN AEROBIC AND ANAEROBIC Blood Culture adequate volume   Culture   Final    NO GROWTH 3 DAYS Performed at [facility], [address] [place], [place] [address]    Report Status PENDING  Incomplete  Aerobic/Anaerobic Culture (surgical/deep wound)     Status: None (Preliminary result)   Collection Time: 06/16/18 12:59 PM  Result Value Ref Range Status   Specimen Description   Final    BONE FOOT RIGHT Performed at [facility], [address] [place], [place] [address]    Special Requests   Final    NONE Performed at [facility], [address] [place], [place] [address]    Gram Stain   Final    RARE WBC SEEN RARE [place] Results Called to: Delena Serve 06/16/18 1410 JGF/SDR Performed at [facility], [address], [place], [place] [address]    Culture   Final    CULTURE REINCUBATED FOR BETTER GROWTH Performed at [facility], [place] [address], [place], [place] [address]    Report Status PENDING  Incomplete  Aerobic/Anaerobic Culture (surgical/deep wound)     Status: None (Preliminary result)   Collection Time: 06/16/18  1:21 PM  Result Value Ref Range Status   Specimen Description   Final    BONE Performed at [facility], [address] [place], [place] [address]    Special Requests   Final    LEFT FOOT Performed at [facility], [address] [place], [place] [address]    Gram Stain   Final    RARE WBC PRESENT, PREDOMINANTLY MONONUCLEAR NO ORGANISMS SEEN    Culture   Final    CULTURE REINCUBATED FOR BETTER GROWTH Performed at [facility], [place] [address], [place], [place] [address]    Report Status  PENDING  Incomplete    Studies/Results: No results found.  Assessment/Plan: Lauris Serviss Norrisis a 32 y.o.malewith poorly controlled IDDM complicated by neuropathy and nephropathy, prior diabetic foot infections now with bilateral osteomyelitis and cellulitis and MRSA bacteremia. He was treated with IV unasyn from 10/2-  11//15/19 for L foot infection. Then received 2 weeks PO augmentin. The infections have certainly progressed since that time and now have identified MRSA. 12/13 - fu bcx 12/12 ngtd, no fevers. TTE neg endocarditis but does have possible thrombus Lateral wall of LV. Wbc down 12/14- s/p surgery 12/13 with bil achilles lengthing, resection of 5th MT base, anchor of tendon and debridlement of ulcerations. Cultures pending. 12/15 no fevers, wbc 8 deep cultures sent 12/13 neg so far Recommendations MRSA bacteremia - Continue vancomycin  FU cx 12/2 negative- can place picc 1/15 Can hold on TEE unless need to eval the possible LV thrombus.  Monitor for sites of metastatic infection or emboli  DM foot infection and osteomyelitis- bilateral. s/p Surgery 12/13 Contzosynfor pseudomonal coverage and anaerobic coverage for now Deep cultures pending and will decide on long term abx treatment based on these Hopefully will just need Vanco IV for 4-6 weeks  Thank you very much for the consult. Will follow with you.  Shawn [name]   06/18/2018, 2:29 PM

## 2018-06-18 NOTE — Progress Notes (Signed)
[facility] NAME: Shawn [name]    MR#:  [id number]  DATE OF BIRTH:  1985-07-16  SUBJECTIVE:  CHIEF COMPLAINT:   Chief Complaint  Patient presents with  . Tachycardia  . Nausea  . Foot Pain  . Emesis   -Patient with bilateral osteomyelitis of fifth metatarsal and Achilles tendon deformity-status post surgery and postop day 2 today -No fevers.   -Nonweightbearing on both feet for now  REVIEW OF SYSTEMS:  Review of Systems  Constitutional: Negative for chills, fever and malaise/fatigue.  HENT: Negative for congestion, ear discharge, hearing loss and nosebleeds.   Eyes: Negative for blurred vision and double vision.  Respiratory: Negative for cough, shortness of breath and wheezing.   Cardiovascular: Negative for chest pain and palpitations.  Gastrointestinal: Positive for abdominal pain. Negative for constipation, diarrhea, nausea and vomiting.  Genitourinary: Negative for dysuria.  Musculoskeletal: Positive for joint pain and myalgias.  Neurological: Negative for dizziness, focal weakness, seizures, weakness and headaches.  Psychiatric/Behavioral: Negative for depression.    DRUG ALLERGIES:   Allergies  Allergen Reactions  . Banana Hives, Nausea And Vomiting and Rash  . Keflex [Cephalexin] Rash    No swelling- also taken penicillin without any issue.  . Onion Hives, Nausea And Vomiting and Rash  . Sulfa Antibiotics Anaphylaxis  . Grapeseed Extract [Nutritional Supplements] Itching  . Shellfish Allergy Hives    "ALL SEAFOOD"    VITALS:  Blood pressure 122/87, pulse 87, temperature 97.6 F (36.4 C), temperature source Oral, resp. rate 20, height 5\' 6"  (1.676 m), weight 52.2 kg, SpO2 98 %.  PHYSICAL EXAMINATION:  Physical Exam   GENERAL:  32 y.o.-year-old thin built patient lying in the bed with no acute distress.  EYES: Pupils equal, round, reactive to light and accommodation. No scleral icterus. Extraocular  muscles intact.  HEENT: Head atraumatic, normocephalic. Oropharynx and nasopharynx clear.  NECK:  Supple, no jugular venous distention. No thyroid enlargement, no tenderness.  LUNGS: Normal breath sounds bilaterally, no wheezing, rales,rhonchi or crepitation. No use of accessory muscles of respiration.  CARDIOVASCULAR: S1, S2 normal. No murmurs, rubs, or gallops.  ABDOMEN: Soft, nontender, nondistended. Bowel sounds present. No organomegaly or mass.  EXTREMITIES: Both feet are in splints and heavy dressing present NEUROLOGIC: Cranial nerves II through XII are intact. Muscle strength 5/5 in all extremities. Sensation intact. Gait not checked.  PSYCHIATRIC: The patient is alert and oriented x 3.  SKIN: No obvious rash, lesion, or ulcer.    LABORATORY PANEL:   CBC Recent Labs  Lab 06/18/18 0403  WBC 8.0  HGB 7.9*  HCT 25.4*  PLT 213   ------------------------------------------------------------------------------------------------------------------  Chemistries  Recent Labs  Lab 06/14/18 1408  06/18/18 0403  NA 129*   < > 137  K 4.5   < > 3.1*  CL 90*   < > 107  CO2 24   < > 22  GLUCOSE 515*   < > 154*  BUN 20   < > 23*  CREATININE 2.14*   < > 1.63*  CALCIUM 8.7*   < > 7.6*  AST 17  --   --   ALT 9  --   --   ALKPHOS 160*  --   --   BILITOT 0.8  --   --    < > = values in this interval not displayed.   ------------------------------------------------------------------------------------------------------------------  Cardiac Enzymes No results for input(s): TROPONINI in the last 168 hours. ------------------------------------------------------------------------------------------------------------------  RADIOLOGY:  No results found.  EKG:   Orders placed or performed during the hospital encounter of 06/14/18  . EKG 12-Lead  . EKG 12-Lead  . ED EKG  . ED EKG  . EKG    ASSESSMENT AND PLAN:   32 year old male with past medical history significant for type 1  diabetes mellitus, neuropathy, gastroparesis comes in with bilateral feet pain and noted to have MRSA bacteremia and osteomyelitis.  1.  Osteomyelitis of bilateral fifth metatarsal and Achilles tendon tightening-appreciate podiatry and ID consult -Patient with MRSA bacteremia-repeat blood cultures negative so far -PICC line to be placed -Currently on vancomycin and Zosyn until final deep culture results are available.  And will need for 4 to 6 weeks of treatment -Patient is status post bony debridement along the lateral border of both feet and bilateral percutaneous Achilles lengthenings. -Nonweightbearing both legs, currently in splints.  Will get shoes tomorrow and minimal weightbearing to get to bedside commode with physical therapy tomorrow. -Management per podiatry.   -Continue pain control and antibiotics  2.  Type 1 diabetes mellitus-currently on Lantus, dose adjusted due to sugars in the 200 range -On aspart pre-meal insulin and sliding scale  3.  Depression-on Prozac, Xanax for anxiety  4.  Anemia of chronic disease-stable after surgery.  No indication for transfusion yet until hemoglobin is less than 7. -Continue iron supplements  5.  Acute renal failure on CKD stage III, baseline creatinine seems to be around 1.7. -Improving with IV fluids  6.  DVT prophylaxis-subcutaneous heparin  Anticipate discharge on 06/20/2018 with home health   All the records are reviewed and case discussed with Care Management/Social Workerr. Management plans discussed with the patient, family and they are in agreement.  CODE STATUS: Full code  TOTAL TIME TAKING CARE OF THIS PATIENT: 37 minutes.   POSSIBLE D/C IN 2 DAYS, DEPENDING ON CLINICAL CONDITION.   Gladstone Lighter M.D on 06/18/2018 at 11:27 AM  Between 7am to 6pm - Pager - [phone]  After 6pm go to [email or web address] - password [email or web address] [facility]  Office  [phone]  CC: Primary care physician; Valera Castle, MD

## 2018-06-19 ENCOUNTER — Inpatient Hospital Stay: Payer: Self-pay

## 2018-06-19 LAB — CBC
HCT: 24.6 % — ABNORMAL LOW (ref 39.0–52.0)
Hemoglobin: 7.6 g/dL — ABNORMAL LOW (ref 13.0–17.0)
MCH: 25.4 pg — ABNORMAL LOW (ref 26.0–34.0)
MCHC: 30.9 g/dL (ref 30.0–36.0)
MCV: 82.3 fL (ref 80.0–100.0)
Platelets: 215 10*3/uL (ref 150–400)
RBC: 2.99 MIL/uL — ABNORMAL LOW (ref 4.22–5.81)
RDW: 13.8 % (ref 11.5–15.5)
WBC: 7.3 10*3/uL (ref 4.0–10.5)
nRBC: 0 % (ref 0.0–0.2)

## 2018-06-19 LAB — CULTURE, BLOOD (ROUTINE X 2)
Culture: NO GROWTH
Culture: NO GROWTH

## 2018-06-19 LAB — GLUCOSE, CAPILLARY
Glucose-Capillary: 131 mg/dL — ABNORMAL HIGH (ref 70–99)
Glucose-Capillary: 121 mg/dL — ABNORMAL HIGH (ref 70–99)
Glucose-Capillary: 125 mg/dL — ABNORMAL HIGH (ref 70–99)
Glucose-Capillary: 98 mg/dL (ref 70–99)
Glucose-Capillary: 155 mg/dL — ABNORMAL HIGH (ref 70–99)

## 2018-06-19 LAB — PREPARE RBC (CROSSMATCH)

## 2018-06-19 MED ORDER — SODIUM CHLORIDE 0.9% IV SOLUTION
Freq: Once | INTRAVENOUS | Status: AC
  Administered 2018-06-19: 16:00:00 via INTRAVENOUS

## 2018-06-19 MED ORDER — CLONIDINE HCL 0.1 MG PO TABS
0.1000 mg | ORAL_TABLET | Freq: Once | ORAL | Status: AC
  Administered 2018-06-19: 0.1 mg via ORAL
  Filled 2018-06-19: qty 1

## 2018-06-19 MED ORDER — OCUVITE-LUTEIN PO CAPS
1.0000 | ORAL_CAPSULE | Freq: Every day | ORAL | Status: DC
  Administered 2018-06-19 – 2018-06-20 (×2): 1 via ORAL
  Filled 2018-06-19 (×2): qty 1

## 2018-06-19 MED ORDER — VANCOMYCIN HCL IN DEXTROSE 1-5 GM/200ML-% IV SOLN
1000.0000 mg | INTRAVENOUS | Status: DC
  Administered 2018-06-20: 1000 mg via INTRAVENOUS
  Filled 2018-06-19 (×2): qty 200

## 2018-06-19 MED ORDER — VITAMIN C 500 MG PO TABS
250.0000 mg | ORAL_TABLET | Freq: Two times a day (BID) | ORAL | Status: DC
  Administered 2018-06-19 – 2018-06-20 (×3): 250 mg via ORAL
  Filled 2018-06-19 (×3): qty 1

## 2018-06-19 MED ORDER — PREMIER PROTEIN SHAKE
11.0000 [oz_av] | Freq: Two times a day (BID) | ORAL | Status: DC
  Administered 2018-06-19 (×2): 11 [oz_av] via ORAL

## 2018-06-19 MED ORDER — OXYCODONE-ACETAMINOPHEN 5-325 MG PO TABS
1.0000 | ORAL_TABLET | ORAL | Status: DC | PRN
  Administered 2018-06-19 – 2018-06-20 (×4): 1 via ORAL
  Filled 2018-06-19 (×4): qty 1

## 2018-06-19 MED ORDER — OXYCODONE HCL 5 MG PO TABS
5.0000 mg | ORAL_TABLET | ORAL | Status: DC | PRN
  Administered 2018-06-20: 5 mg via ORAL
  Filled 2018-06-19: qty 1

## 2018-06-19 MED ORDER — CLONIDINE HCL 0.1 MG PO TABS: 0.1000 mg | ORAL_TABLET | Freq: Four times a day (QID) | ORAL | Status: DC | PRN

## 2018-06-19 MED ORDER — HYDROMORPHONE HCL 1 MG/ML IJ SOLN
1.0000 mg | Freq: Three times a day (TID) | INTRAMUSCULAR | Status: DC | PRN
  Administered 2018-06-19: 1 mg via INTRAVENOUS

## 2018-06-19 NOTE — Progress Notes (Signed)
Infectious Disease Long Term IV Antibiotic Orders Shawn [name] Nov 17, 1985  Diagnosis: MRSA bacteremia, Diabetic foot infection and osteomyelitis  Culture results Streptococcus mitis/oralis (ZZ00)   Antibiotic Interpretation Microscan Method Status   TETRACYCLINE Sensitive 1 SENSITIVE MIC Final   VANCOMYCIN Sensitive 0.5 SENSITIVE MIC Final   CLINDAMYCIN Sensitive <=0.25 SENSITIVE MIC Final   Susceptibility Comments   FEW STREPTOCOCCUS MITIS/ORALIS  Methicillin resistant staphylococcus aureus (ZZ01)   Antibiotic Interpretation Microscan Method Status   CIPROFLOXACIN Sensitive <=0.5 SENSITIVE MIC Final   ERYTHROMYCIN Intermediate 1 INTERMEDIATE MIC Final   GENTAMICIN Sensitive <=0.5 SENSITIVE MIC Final   OXACILLIN Resistant >=4 RESISTANT MIC Final   TETRACYCLINE Sensitive <=1 SENSITIVE MIC Final   VANCOMYCIN Sensitive 1 SENSITIVE MIC Final   TRIMETH/SULFA Sensitive <=10 SENSITIVE MIC Final   CLINDAMYCIN Sensitive <=0.25 SENSITIVE MIC Final   RIFAMPIN Sensitive <=0.5 SENSITIVE MIC Final   Inducible Clindamycin Sensitive NEGATIVE MIC Final   Susceptibility Comments   FEW METHICILLIN RESISTANT STAPHYLOCOCCUS AUREUS     LABS Lab Results  Component Value Date   CREATININE 1.63 (H) 06/18/2018   Lab Results  Component Value Date   WBC 7.3 06/19/2018   HGB 7.6 (L) 06/19/2018   HCT 24.6 (L) 06/19/2018   MCV 82.3 06/19/2018   PLT 215 06/19/2018   Lab Results  Component Value Date   ESRSEDRATE 32 (H) 06/14/2018   Lab Results  Component Value Date   CRP 11.2 (H) 06/16/2018    Allergies:  Allergies  Allergen Reactions  . Banana Hives, Nausea And Vomiting and Rash  . Keflex [Cephalexin] Rash    No swelling- also taken penicillin without any issue.  . Onion Hives, Nausea And Vomiting and Rash  . Sulfa Antibiotics Anaphylaxis  . Grapeseed Extract [Nutritional Supplements] Itching  . Shellfish Allergy Hives    "ALL SEAFOOD"    Discharge antibiotics Vancomycin         1000          mg  every    24           hours .     Goal vancomycin trough 15-20.    Pharmacy to adjust dosing based on levels  PICC Care per protocol Labs weekly while on IV antibiotics -FAX weekly labs to [phone] CBC w diff   Comprehensive met panel Vancomycin Trough    Planned duration of antibiotics 6 weeks   Stop date Jan 23rd 2020  Follow up clinic date 3-4 weeks   Leonel Ramsay, MD

## 2018-06-19 NOTE — Progress Notes (Signed)
[facility] INFECTIOUS DISEASE PROGRESS NOTE Date of Admission:  06/14/2018     ID: Shawn [name] is a 32 y.o. male with diabetic foot infection Active Problems:   Sepsis ([place])   Subjective: No fevers. Some diarrhea.  Some foot pain bilateraly  ROS  Eleven systems are reviewed and negative except per hpi  Medications:  Antibiotics Given (last 72 hours)    Date/Time Action Medication Dose Rate   06/16/18 1642 New Bag/Given   piperacillin-tazobactam (ZOSYN) IVPB 3.375 g 3.375 g 12.5 mL/hr   06/16/18 2148 New Bag/Given   vancomycin (VANCOCIN) IVPB 750 mg/150 ml premix 750 mg 150 mL/hr   06/16/18 2258 New Bag/Given   piperacillin-tazobactam (ZOSYN) IVPB 3.375 g 3.375 g 12.5 mL/hr   06/17/18 6237 New Bag/Given   piperacillin-tazobactam (ZOSYN) IVPB 3.375 g 3.375 g 12.5 mL/hr   06/17/18 1628 New Bag/Given   piperacillin-tazobactam (ZOSYN) IVPB 3.375 g 3.375 g 12.5 mL/hr   06/17/18 2147 New Bag/Given   piperacillin-tazobactam (ZOSYN) IVPB 3.375 g 3.375 g 11.75 mL/hr   06/18/18 0534 New Bag/Given   piperacillin-tazobactam (ZOSYN) IVPB 3.375 g 3.375 g 12.5 mL/hr   06/18/18 1002 New Bag/Given   vancomycin (VANCOCIN) IVPB 750 mg/150 ml premix 750 mg 150 mL/hr   06/18/18 1553 New Bag/Given   piperacillin-tazobactam (ZOSYN) IVPB 3.375 g 3.375 g 12.5 mL/hr   06/18/18 2259 New Bag/Given   piperacillin-tazobactam (ZOSYN) IVPB 3.375 g 3.375 g 12.5 mL/hr   06/19/18 0314 New Bag/Given   vancomycin (VANCOCIN) IVPB 750 mg/150 ml premix 750 mg 150 mL/hr   06/19/18 0838 New Bag/Given   piperacillin-tazobactam (ZOSYN) IVPB 3.375 g 3.375 g 12.5 mL/hr     . sodium chloride   Intravenous Once  . ferrous sulfate  325 mg Oral BID WC  . FLUoxetine  20 mg Oral QHS  . heparin injection (subcutaneous)  5,000 Units Subcutaneous Q8H  . insulin aspart  0-5 Units Subcutaneous QHS  . insulin aspart  0-9 Units Subcutaneous TID WC  . insulin aspart  4 Units Subcutaneous TID WC  . insulin glargine   25 Units Subcutaneous QHS  . multivitamin-lutein  1 capsule Oral Daily  . protein supplement shake  11 oz Oral BID BM  . vitamin C  250 mg Oral BID    Objective: Vital signs in last 24 hours: Temp:  [97.8 F (36.6 C)-98.4 F (36.9 C)] 98.4 F (36.9 C) (12/16 1209) Pulse Rate:  [87-98] 98 (12/16 1209) Resp:  [10-18] 12 (12/16 1209) BP: (139-153)/(93-102) 139/97 (12/16 1209) SpO2:  [98 %-99 %] 99 % (12/16 1209) Constitutional:Thin, chronically ill appearing HENT:anicteric Mouth/Throat: Oropharynx is clear and moist. No oropharyngeal exudate.  Cardiovascular: Normal rate, regular rhythm and normal heart sounds. Pulmonary/Chest: Effort normal and breath sounds normal. No respiratory distress. He has no wheezes.  Abdominal: Soft. Bowel sounds are normal. He exhibits no distension. There is no tenderness.  Lymphadenopathy: He has no cervical adenopathy.  Neurological: He is alert and oriented to person, place, and time.  Skin:bilateral feet wrapped Psychiatric: He has a normal mood and affect. His behavior is normal.   Lab Results Recent Labs    06/18/18 0403 06/19/18 0526  WBC 8.0 7.3  HGB 7.9* 7.6*  HCT 25.4* 24.6*  NA 137  --   K 3.1*  --   CL 107  --   CO2 22  --   BUN 23*  --   CREATININE 1.63*  --     Microbiology: Results for orders placed or  performed during the hospital encounter of 06/14/18  Blood culture (single)     Status: Abnormal   Collection Time: 06/14/18  2:09 PM  Result Value Ref Range Status   Specimen Description   Final    BLOOD RIGHT ANTECUBITAL Performed at [facility], [address], [place], [place] [address]    Special Requests   Final    BOTTLES DRAWN AEROBIC AND ANAEROBIC Blood Culture adequate volume Performed at [facility], [address], [place], [place] [address]    Culture  Setup Time   Final    GRAM POSITIVE COCCI AEROBIC BOTTLE ONLY CRITICAL RESULT CALLED TO, READ BACK BY AND VERIFIED WITH:  [facility]  SLAUGHTER AT 1030 06/15/18 SDR Performed at [facility], Time [address], [place], [place] [address]    Culture METHICILLIN RESISTANT STAPHYLOCOCCUS AUREUS (A)  Final   Report Status 06/17/2018 FINAL  Final   Organism ID, Bacteria METHICILLIN RESISTANT STAPHYLOCOCCUS AUREUS  Final      Susceptibility   Methicillin resistant staphylococcus aureus - MIC*    CIPROFLOXACIN <=0.5 SENSITIVE Sensitive     ERYTHROMYCIN 1 INTERMEDIATE Intermediate     GENTAMICIN <=0.5 SENSITIVE Sensitive     OXACILLIN >=4 RESISTANT Resistant     TETRACYCLINE <=1 SENSITIVE Sensitive     VANCOMYCIN <=0.5 SENSITIVE Sensitive     TRIMETH/SULFA <=10 SENSITIVE Sensitive     CLINDAMYCIN <=0.25 SENSITIVE Sensitive     RIFAMPIN <=0.5 SENSITIVE Sensitive     Inducible Clindamycin NEGATIVE Sensitive     * METHICILLIN RESISTANT STAPHYLOCOCCUS AUREUS  Blood Culture ID Panel (Reflexed)     Status: Abnormal   Collection Time: 06/14/18  2:09 PM  Result Value Ref Range Status   Enterococcus species NOT DETECTED NOT DETECTED Final   Listeria monocytogenes NOT DETECTED NOT DETECTED Final   Staphylococcus species DETECTED (A) NOT DETECTED Final    Comment: CRITICAL RESULT CALLED TO, READ BACK BY AND VERIFIED WITH:  MYRA SLAUGHTER AT 1030 06/15/18 SDR    Staphylococcus aureus (BCID) DETECTED (A) NOT DETECTED Final    Comment: Methicillin (oxacillin)-resistant Staphylococcus aureus (MRSA). MRSA is predictably resistant to beta-lactam antibiotics (except ceftaroline). Preferred therapy is vancomycin unless clinically contraindicated. Patient requires contact precautions if  hospitalized. CRITICAL RESULT CALLED TO, READ BACK BY AND VERIFIED WITH:  MYRA SLAUGHTER AT 1030 06/15/18 SDR    Methicillin resistance DETECTED (A) NOT DETECTED Final    Comment: CRITICAL RESULT CALLED TO, READ BACK BY AND VERIFIED WITH:  MYRA SLAUGHTER AT 1030 06/15/18 SDR    Streptococcus species NOT DETECTED NOT DETECTED Final   Streptococcus  agalactiae NOT DETECTED NOT DETECTED Final   Streptococcus pneumoniae NOT DETECTED NOT DETECTED Final   Streptococcus pyogenes NOT DETECTED NOT DETECTED Final   Acinetobacter baumannii NOT DETECTED NOT DETECTED Final   Enterobacteriaceae species NOT DETECTED NOT DETECTED Final   Enterobacter cloacae complex NOT DETECTED NOT DETECTED Final   Escherichia coli NOT DETECTED NOT DETECTED Final   Klebsiella oxytoca NOT DETECTED NOT DETECTED Final   Klebsiella pneumoniae NOT DETECTED NOT DETECTED Final   Proteus species NOT DETECTED NOT DETECTED Final   Serratia marcescens NOT DETECTED NOT DETECTED Final   Haemophilus influenzae NOT DETECTED NOT DETECTED Final   Neisseria meningitidis NOT DETECTED NOT DETECTED Final   Pseudomonas aeruginosa NOT DETECTED NOT DETECTED Final   Candida albicans NOT DETECTED NOT DETECTED Final   Candida glabrata NOT DETECTED NOT DETECTED Final   Candida krusei NOT DETECTED NOT DETECTED Final  Candida parapsilosis NOT DETECTED NOT DETECTED Final   Candida tropicalis NOT DETECTED NOT DETECTED Final    Comment: Performed at [facility], [address] [place], [place] [address]  Blood Culture (routine x 2)     Status: None   Collection Time: 06/14/18  5:01 PM  Result Value Ref Range Status   Specimen Description BLOOD BLOOD LEFT HAND  Final   Special Requests   Final    BOTTLES DRAWN AEROBIC AND ANAEROBIC Blood Culture results may not be optimal due to an inadequate volume of blood received in culture bottles   Culture   Final    NO GROWTH 5 DAYS Performed at [facility], [address] [place], [place] [address]    Report Status 06/19/2018 FINAL  Final  Blood Culture (routine x 2)     Status: None   Collection Time: 06/14/18  5:01 PM  Result Value Ref Range Status   Specimen Description BLOOD BLOOD RIGHT HAND  Final   Special Requests   Final    BOTTLES DRAWN AEROBIC AND ANAEROBIC Blood Culture results may not be optimal due to an  inadequate volume of blood received in culture bottles   Culture   Final    NO GROWTH 5 DAYS Performed at [facility], [address] [place], [place] [address]    Report Status 06/19/2018 FINAL  Final  Culture, blood (single) w Reflex to ID Panel     Status: None (Preliminary result)   Collection Time: 06/15/18 11:53 AM  Result Value Ref Range Status   Specimen Description BLOOD BLOOD LEFT FOREARM  Final   Special Requests   Final    BOTTLES DRAWN AEROBIC AND ANAEROBIC Blood Culture adequate volume   Culture   Final    NO GROWTH 4 DAYS Performed at [facility], [address] [place], [place] [address]    Report Status PENDING  Incomplete  Aerobic/Anaerobic Culture (surgical/deep wound)     Status: None (Preliminary result)   Collection Time: 06/16/18 12:59 PM  Result Value Ref Range Status   Specimen Description   Final    BONE FOOT RIGHT Performed at [facility], [address] [place], [place] [address]    Special Requests   Final    NONE Performed at [facility], [address] [place],  [address]    Gram Stain   Final    RARE WBC SEEN RARE [place] Results Called to: Delena Serve 06/16/18 1410 JGF/SDR Performed at [facility], [address] [place],  [address]    Culture   Final    FEW STREPTOCOCCUS MITIS/ORALIS FEW METHICILLIN RESISTANT STAPHYLOCOCCUS AUREUS    Report Status PENDING  Incomplete   Organism ID, Bacteria STREPTOCOCCUS MITIS/ORALIS  Final   Organism ID, Bacteria METHICILLIN RESISTANT STAPHYLOCOCCUS AUREUS  Final      Susceptibility   Methicillin resistant staphylococcus aureus - MIC*    CIPROFLOXACIN <=0.5 SENSITIVE Sensitive     ERYTHROMYCIN 1 INTERMEDIATE Intermediate     GENTAMICIN <=0.5 SENSITIVE Sensitive     OXACILLIN >=4 RESISTANT Resistant     TETRACYCLINE <=1 SENSITIVE Sensitive     VANCOMYCIN 1 SENSITIVE Sensitive     TRIMETH/SULFA <=10 SENSITIVE  Sensitive     CLINDAMYCIN <=0.25 SENSITIVE Sensitive     RIFAMPIN <=0.5 SENSITIVE Sensitive     Inducible Clindamycin NEGATIVE Sensitive     * FEW METHICILLIN RESISTANT STAPHYLOCOCCUS AUREUS   Streptococcus mitis/oralis - MIC*  TETRACYCLINE 1 SENSITIVE Sensitive     VANCOMYCIN 0.5 SENSITIVE Sensitive     CLINDAMYCIN <=0.25 SENSITIVE Sensitive     * FEW STREPTOCOCCUS MITIS/ORALIS  Aerobic/Anaerobic Culture (surgical/deep wound)     Status: None (Preliminary result)   Collection Time: 06/16/18  1:21 PM  Result Value Ref Range Status   Specimen Description   Final    BONE Performed at [facility], [address], [place], [place] [address]    Special Requests   Final    LEFT FOOT Performed at [facility], [address] [place], [place] [address]    Gram Stain   Final    RARE WBC PRESENT, PREDOMINANTLY MONONUCLEAR NO ORGANISMS SEEN Performed at [facility], [place] [address], [place], [place] [address]    Culture   Final    RARE UNIDENTIFIED ORGANISM NO ANAEROBES ISOLATED; CULTURE IN PROGRESS FOR 5 DAYS    Report Status PENDING  Incomplete    Studies/Results: [place] Ekg Site Rite  Result Date: 06/19/2018 If Site Rite image not attached, placement could not be confirmed due to current cardiac rhythm.   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 He was treated with IV unasyn from 10/2- 11//15/19 for L foot infection. Then received 2 weeks PO augmentin. The infections have certainly progressed since that time and now have identified MRSA. 12/13 - fu bcx 12/12 ngtd, no fevers. TTE neg endocarditis but does have possible thrombus Lateral wall of LV. Wbc down 12/14- s/p surgery 12/13 with bil achilles lengthing, resection of 5th MT base, anchor of tendon and debridlement of  ulcerations. Cultures pending. 12/15 no fevers, wbc 8 deep cultures sent 12/13 neg so far 12/16 - no fevers, Culture with Strep Mitis/oralis, and MRSA.  Recommendations MRSA bacteremia - Continue vancomycin  FU cx 12/2 negative- Picc placed  Can hold on TEE unless need to eval the possible LV thrombus.  Monitor for sites of metastatic infection or emboli  DM foot infection and osteomyelitis- bilateral. s/p Surgery 12/13 Contzosynfor pseudomonal coverage and anaerobic coverage for now Deep cultures pending and will decide on long term abx treatment based on these but for now plan on IV vanco Needs better DM control with A1c 10.8  Thank you very much for the consult. Will follow with you.  Shawn [name]   06/19/2018, 12:50 PM

## 2018-06-19 NOTE — Progress Notes (Signed)
PT Cancellation Note  Patient Details Name: Shawn [name] MRN: [id number] DOB: 1985/12/10   Cancelled Treatment:    Reason Eval/Treat Not Completed: Other (comment).  PT consult received.  Chart reviewed.  Per discussion with pt's nurse, pt does not have B Cam walking boots yet (plan to place in these boots today per PT order).  Will re-attempt PT evaluation at a later date/time.  Leitha Bleak, PT 06/19/18, 10:06 AM [phone]

## 2018-06-19 NOTE — Progress Notes (Signed)
RN contacted Dr. Tressia Miners about pts elevated BP. RN to give PRN labetolol and tol hold blood until pressure is lowered. Blood is to be infused over 3 hours.

## 2018-06-19 NOTE — Consult Note (Signed)
Pharmacy Antibiotic Note  Shawn [name] is a 32 y.o. male admitted on 06/14/2018 with cellulitis.  Pharmacy has been consulted for Zosyn and Vancomycin. dosing. Since admission his blood has grown out MRSA.  His SCr has increased slightly from yesterday (approximate baseline of 1.5) and is calculated to be at the upper end of the vancomycin therapeutic goal. Doses have been given on time. The patient was started on Unasyn initially that was changed to Zosyn by ID on 12/12 for anaerobe and pseudomonal coverage. Cultures from foot growing S. Mitis/oralis and MRSA.  2nd bone culture still awaiting final identification   Plan: 1) Continue Zosyn EI IV every 8 hours  2) Patient to be discharged on vancomycin, change to regimen that is easier to administer as outpatient.  Based on previous dosing/levels, change to vancomycin 1gm IV q24h    Ke=0.038  Vd = 36.75 L   t 1/2 18h  Css 37.7/19.8 mcg/mL  Goal Vt 15-20 mcg/mL   3)  Height: 5\' 6"  (167.6 cm) Weight: 115 lb (52.2 kg) IBW/kg (Calculated) : 63.8  Temp (24hrs), Avg:98.1 F (36.7 C), Min:97.8 F (36.6 C), Max:98.4 F (36.9 C)  Recent Labs  Lab 06/14/18 1400 06/14/18 1408 06/14/18 1800 06/15/18 0512 06/15/18 1153 06/16/18 0355 06/17/18 1525 06/18/18 0403 06/19/18 0526  WBC  --  21.2*  --  12.3*  --  6.2  --  8.0 7.3  CREATININE  --  2.14*  --  1.73*  --  1.98*  --  1.63*  --   LATICACIDVEN 1.91*  --  1.1  --  0.6  --   --   --   --   VANCOTROUGH  --   --   --   --   --   --  18  --   --     Estimated Creatinine Clearance: 48.9 mL/min (A) (by C-G formula based on SCr of 1.63 mg/dL (H)).    Antimicrobials this admission: Vancomycin 12/11 >>  Zosyn 12/12 >> Unasyn 12/11 >>12/12 Aztreonam 12/11 x1  Microbiology results: 12/11 BCx: MRSA  Thank you for allowing pharmacy to be a part of this patient's care.  Doreene Eland, PharmD, BCPS.   Work Cell: [phone] 06/19/2018 2:00 PM

## 2018-06-19 NOTE — Evaluation (Signed)
Physical Therapy Evaluation Patient Details Name: Shawn [name] MRN: [id number] DOB: 09-19-1985 Today's Date: 06/19/2018   History of Present Illness  Pt is a 32 y.o. male presenting to hospital 06/14/18 with N/V and increased pain in R foot and tachycardic.  Pt admitted with clinical sepsis with B feet infections, tachycardia, and leukocytosis.  Pt noted with B osteomyelitis and cellulitis and MRSA bacteremia.  Pt s/p 06/16/18 B percutaneous Achilles lengthenings and resection of B 5th metatarsal bases.  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  Clinical Impression  Prior to hospital admission, pt was modified independent with household ambulation using walker (pt reports laying in bed a lot though since initial amputations in June of this year).  Pt lives with his family in 1 level home with 1 step to enter (no railing).  Currently pt is modified independent with bed mobility; CGA to SBA with transfers with RW use; and CGA to SBA with short "ambulation" (about 3 feet) bed to/from recliner with RW (limited distance d/t 9/10 B foot pain and orders for bathroom privileges only).  Pt preferring to use slow controlled B LE "hop to" pattern with RW use (pt's UE's appearing strong on walker allowing slow controlled B LE advancement).  Pt educated on [facility] precautions and need to keep as much weight as possible through UE's on walker; pt verbalizing understanding but tended to do what he wanted to do.  Pt requesting back to bed for planned blood transfusion today.  Pt refusing w/c for home d/t being small home and his grandmother already uses one in the home.  Pt reporting 9/10 B foot pain beginning and end of session (nurse notified for pain meds).  Pt would benefit from skilled PT to address noted impairments and functional limitations during hospital stay (see below for any additional details).  Upon hospital discharge,  anticipate no further PT needs.    Follow Up Recommendations No PT follow up    Equipment Recommendations  Rolling walker with 5" wheels;3in1 (PT)    Recommendations for Other Services       Precautions / Restrictions Precautions Precautions: Fall Precaution Comments: Bathroom privileges only Restrictions Weight Bearing Restrictions: Yes RLE Weight Bearing: Partial weight bearing LLE Weight Bearing: Partial weight bearing Other Position/Activity Restrictions: Partial WB using walker.  Bathroom privileges only.      Mobility  Bed Mobility Overal bed mobility: Modified Independent             General bed mobility comments: Semi-supine to/from sit with HOB elevated without any noted difficulty  Transfers Overall transfer level: Needs assistance Equipment used: Rolling walker (2 wheeled) Transfers: Sit to/from Stand Sit to Stand: Min guard;Supervision         General transfer comment: x1 trial from bed and x1 trial from recliner; initial vc's for technique (with walker use) and WB'ing precautions and then pt able to perform on own safely  Ambulation/Gait Ambulation/Gait assistance: Min guard;Supervision Gait Distance (Feet): (3 feet x2 bed to/from recliner) Assistive device: Rolling walker (2 wheeled)   Gait velocity: decreased   General Gait Details: initial vc's and demo for technique with WB'ing precautions and walker use; pt took 2 steps (1 each LE) practicing precautions and then pt switched to B simultaneous controlled "hop to" technique (appearing steady with RW use)  Science writer  Modified Rankin (Stroke Patients Only)       Balance Overall balance assessment: Needs assistance Sitting-balance support: No upper extremity supported;Feet supported Sitting balance-Leahy Scale: Normal Sitting balance - Comments: steady sitting reaching outside BOS   Standing balance support: Bilateral upper extremity supported;During  functional activity Standing balance-Leahy Scale: Good Standing balance comment: steady with RW use                             Pertinent Vitals/Pain Pain Assessment: 0-10 Pain Score: 9  Pain Location: B feet Pain Descriptors / Indicators: Sore;Tender Pain Intervention(s): Limited activity within patient's tolerance;Monitored during session;Premedicated before session;Repositioned;Patient requesting pain meds-RN notified    Home Living Family/patient expects to be discharged to:: Private residence Living Arrangements: Children;Parent;Other relatives;Other (Comment)(Dad, sister, grandma, and 30 y.o. son) Available Help at Discharge: Family Type of Home: House Home Access: Stairs to enter Entrance Stairs-Rails: None Entrance Stairs-Number of Steps: 1 Home Layout: One level Home Equipment: Grab bars - toilet;Shower seat;Grab bars - tub/shower;Walker - 2 wheels;Bedside commode      Prior Function Level of Independence: Independent with assistive device(s)         Comments: Pt has been cooking meals for his grandma and son (tries to sit on stool in kitchen and son brings him materials).  Tends to stay mostly in bed but will ambulate short distances in home (gets fatigued quickly at times) with walker.  Pt reports he can not use w/c in home (does not fit considering his grandma is already in a w/c in the home and it is a small home per pt report)     Hand Dominance        Extremity/Trunk Assessment   Upper Extremity Assessment Upper Extremity Assessment: Overall WFL for tasks assessed    Lower Extremity Assessment Lower Extremity Assessment: Generalized weakness(Limited assessment d/t B CAM boots in place and s/p surgery with precautions)    Cervical / Trunk Assessment Cervical / Trunk Assessment: Normal  Communication   Communication: No difficulties  Cognition Arousal/Alertness: Awake/alert Behavior During Therapy: (Did not appear very happy but was  agreeable to short PT session) Overall Cognitive Status: Within Functional Limits for tasks assessed                                        General Comments General comments (skin integrity, edema, etc.): B CAM boots in place.  Nursing cleared pt for participation in physical therapy.  Pt agreeable to limited PT session.    Exercises     Assessment/Plan    PT Assessment Patient needs continued PT services  PT Problem List Decreased strength;Decreased activity tolerance;Decreased balance;Decreased mobility;Decreased knowledge of use of DME;Decreased knowledge of precautions;Impaired sensation;Pain;Decreased skin integrity       PT Treatment Interventions DME instruction;Gait training;Functional mobility training;Therapeutic activities;Therapeutic exercise;Balance training;Patient/family education    PT Goals (Current goals can be found in the Care Plan section)  Acute Rehab PT Goals Patient Stated Goal: to go home PT Goal Formulation: With patient Time For Goal Achievement: 07/03/18 Potential to Achieve Goals: Good    Frequency Min 2X/week   Barriers to discharge        Co-evaluation               AM-PAC PT "6 Clicks" Mobility  Outcome Measure Help needed turning from your back to your side  while in a flat bed without using bedrails?: None Help needed moving from lying on your back to sitting on the side of a flat bed without using bedrails?: None Help needed moving to and from a bed to a chair (including a wheelchair)?: A Little Help needed standing up from a chair using your arms (e.g., wheelchair or bedside chair)?: A Little Help needed to walk in hospital room?: A Little Help needed climbing 3-5 steps with a railing? : A Lot 6 Click Score: 19    End of Session Equipment Utilized During Treatment: Gait belt Activity Tolerance: Patient limited by pain Patient left: in bed;with call bell/phone within reach;with family/visitor present;Other  (comment)(B LE's elevated via pillow; pt refusing bed alarm (was not on upon PT arrival either)) Nurse Communication: Mobility status;Precautions;Patient requests pain meds;Weight bearing status;Other (comment)(Pt's R UE IV had small amount of blood around site upon PT entering room) PT Visit Diagnosis: Other abnormalities of gait and mobility (R26.89);Muscle weakness (generalized) (M62.81);Difficulty in walking, not elsewhere classified (R26.2);Pain Pain - Right/Left: Left(Right) Pain - part of body: Ankle and joints of foot    Time: 1444-1510 PT Time Calculation (min) (ACUTE ONLY): 26 min   Charges:   PT Evaluation $PT Eval Low Complexity: 1 Low PT Treatments $Therapeutic Activity: 8-22 mins       Leitha Bleak, PT 06/19/18, 5:32 PM [phone]

## 2018-06-19 NOTE — Progress Notes (Signed)
PHARMACY CONSULT NOTE FOR:  OUTPATIENT  PARENTERAL ANTIBIOTIC THERAPY (OPAT)  Indication: MRSA bacteremia with diabetic foot wound and osteomyelitis Regimen: vancomycin 1gm IV q24h End date: 07/27/2018  IV antibiotic discharge orders are pended. To discharging provider:  please sign these orders via discharge navigator,  Select New Orders & click on the button choice - Manage This Unsigned Work.     Thank you for allowing pharmacy to be a part of this patient's care.  Doreene Eland, PharmD, BCPS.   Work Cell: [phone] 06/19/2018 2:22 PM

## 2018-06-19 NOTE — Progress Notes (Signed)
[place] [facility] [place] NAME: Shawn [name]    MR#:  [id number]  DATE OF BIRTH:  07-21-1985  SUBJECTIVE:  CHIEF COMPLAINT:   Chief Complaint  Patient presents with  . Tachycardia  . Nausea  . Foot Pain  . Emesis   -Requesting regular food today as sugars have been within normal range. -Waiting for his postop boots so physical therapy can work with him  REVIEW OF SYSTEMS:  Review of Systems  Constitutional: Negative for chills, fever and malaise/fatigue.  HENT: Negative for congestion, ear discharge, hearing loss and nosebleeds.   Eyes: Negative for blurred vision and double vision.  Respiratory: Negative for cough, shortness of breath and wheezing.   Cardiovascular: Negative for chest pain and palpitations.  Gastrointestinal: Positive for abdominal pain and nausea. Negative for constipation, diarrhea and vomiting.  Genitourinary: Negative for dysuria.  Musculoskeletal: Positive for joint pain and myalgias.  Neurological: Negative for dizziness, focal weakness, seizures, weakness and headaches.  Psychiatric/Behavioral: Negative for depression.    DRUG ALLERGIES:   Allergies  Allergen Reactions  . Banana Hives, Nausea And Vomiting and Rash  . Keflex [Cephalexin] Rash    No swelling- also taken penicillin without any issue.  . Onion Hives, Nausea And Vomiting and Rash  . Sulfa Antibiotics Anaphylaxis  . Grapeseed Extract [Nutritional Supplements] Itching  . Shellfish Allergy Hives    "ALL SEAFOOD"    VITALS:  Blood pressure (!) 139/97, pulse 98, temperature 98.4 F (36.9 C), temperature source Oral, resp. rate 12, height 5\' 6"  (1.676 m), weight 52.2 kg, SpO2 99 %.  PHYSICAL EXAMINATION:  Physical Exam   GENERAL:  32 y.o.-year-old thin built patient lying in the bed with no acute distress.  EYES: Pupils equal, round, reactive to light and accommodation. No scleral icterus. Extraocular muscles intact.  HEENT: Head  atraumatic, normocephalic. Oropharynx and nasopharynx clear.  NECK:  Supple, no jugular venous distention. No thyroid enlargement, no tenderness.  LUNGS: Normal breath sounds bilaterally, no wheezing, rales,rhonchi or crepitation. No use of accessory muscles of respiration.  CARDIOVASCULAR: S1, S2 normal. No murmurs, rubs, or gallops.  ABDOMEN: Soft, nontender, nondistended. Bowel sounds present. No organomegaly or mass.  EXTREMITIES: Both feet are in splints and heavy dressing present NEUROLOGIC: Cranial nerves II through XII are intact. Muscle strength 5/5 in all extremities. Sensation intact. Gait not checked.  PSYCHIATRIC: The patient is alert and oriented x 3.  SKIN: No obvious rash, lesion, or ulcer.    LABORATORY PANEL:   CBC Recent Labs  Lab 06/19/18 0526  WBC 7.3  HGB 7.6*  HCT 24.6*  PLT 215   ------------------------------------------------------------------------------------------------------------------  Chemistries  Recent Labs  Lab 06/14/18 1408  06/18/18 0403  NA 129*   < > 137  K 4.5   < > 3.1*  CL 90*   < > 107  CO2 24   < > 22  GLUCOSE 515*   < > 154*  BUN 20   < > 23*  CREATININE 2.14*   < > 1.63*  CALCIUM 8.7*   < > 7.6*  AST 17  --   --   ALT 9  --   --   ALKPHOS 160*  --   --   BILITOT 0.8  --   --    < > = values in this interval not displayed.   ------------------------------------------------------------------------------------------------------------------  Cardiac Enzymes No results for input(s): TROPONINI in the last 168 hours. ------------------------------------------------------------------------------------------------------------------  RADIOLOGY:  [place]  Ekg Site Rite  Result Date: 06/19/2018 If Site Rite image not attached, placement could not be confirmed due to current cardiac rhythm.   EKG:   Orders placed or performed during the hospital encounter of 06/14/18  . EKG 12-Lead  . EKG 12-Lead  . ED EKG  . ED EKG  . EKG     ASSESSMENT AND PLAN:   32 year old male with past medical history significant for type 1 diabetes mellitus, neuropathy, gastroparesis comes in with bilateral feet pain and noted to have MRSA bacteremia and osteomyelitis.  1.  Osteomyelitis of bilateral fifth metatarsal and Achilles tendon tightening-appreciate podiatry and ID consult -Patient with MRSA bacteremia-repeat blood cultures negative so far -PICC line to be placed today -Currently on vancomycin and Zosyn until final deep culture results are available.  And will need for 4 to 6 weeks of treatment -Patient is status post bony debridement along the lateral border of both feet and bilateral percutaneous Achilles lengthenings. -Nonweightbearing both legs, currently in splints.  Will get post op cam boots today  and minimal weightbearing to get to bedside commode with physical therapy pending. -Management per podiatry.   -Continue pain control and antibiotics  2.  Type 1 diabetes mellitus-currently on Lantus, dose adjusted -On aspart pre-meal insulin and sliding scale  3.  Depression-on Prozac, Xanax for anxiety  4.  Anemia of chronic disease- dropping steadily, feels weak and light headed - no active bleeding. 1 unit prbc transfusion today -Continue iron supplements  5.  Acute renal failure on CKD stage III, baseline creatinine seems to be around 1.7. -Improved with IV fluids  6.  DVT prophylaxis-subcutaneous heparin  Anticipate discharge on 06/20/2018 with home health   All the records are reviewed and case discussed with Care Management/Social Workerr. Management plans discussed with the patient, family and they are in agreement.  CODE STATUS: Full code  TOTAL TIME TAKING CARE OF THIS PATIENT: 37 minutes.   POSSIBLE D/C IN 2 DAYS, DEPENDING ON CLINICAL CONDITION.   Gladstone Lighter M.D on 06/19/2018 at 1:13 PM  Between 7am to 6pm - Pager - [phone]  After 6pm go to [email or web address] - password [email or web address]  [facility]  Office  [phone]  CC: Primary care physician; Valera Castle, MD

## 2018-06-20 LAB — CBC
HCT: 24.4 % — ABNORMAL LOW (ref 39.0–52.0)
Hemoglobin: 7.7 g/dL — ABNORMAL LOW (ref 13.0–17.0)
MCH: 25.6 pg — ABNORMAL LOW (ref 26.0–34.0)
MCHC: 31.6 g/dL (ref 30.0–36.0)
MCV: 81.1 fL (ref 80.0–100.0)
Platelets: 247 10*3/uL (ref 150–400)
RBC: 3.01 MIL/uL — ABNORMAL LOW (ref 4.22–5.81)
RDW: 13.8 % (ref 11.5–15.5)
WBC: 7.4 10*3/uL (ref 4.0–10.5)
nRBC: 0 % (ref 0.0–0.2)

## 2018-06-20 LAB — GLUCOSE, CAPILLARY
Glucose-Capillary: 263 mg/dL — ABNORMAL HIGH (ref 70–99)
Glucose-Capillary: 224 mg/dL — ABNORMAL HIGH (ref 70–99)
Glucose-Capillary: 237 mg/dL — ABNORMAL HIGH (ref 70–99)

## 2018-06-20 LAB — BASIC METABOLIC PANEL
Anion gap: 5 (ref 5–15)
BUN: 19 mg/dL (ref 6–20)
CO2: 23 mmol/L (ref 22–32)
CREATININE: 1.66 mg/dL — AB (ref 0.61–1.24)
Calcium: 7.7 mg/dL — ABNORMAL LOW (ref 8.9–10.3)
Chloride: 109 mmol/L (ref 98–111)
GFR calc Af Amer: >60 mL/min (ref >60)
GFR calc non Af Amer: 54 mL/min — ABNORMAL LOW (ref >60)
Glucose, Bld: 258 mg/dL — ABNORMAL HIGH (ref 70–99)
Potassium: 3.2 mmol/L — ABNORMAL LOW (ref 3.5–5.1)
Sodium: 137 mmol/L (ref 135–145)

## 2018-06-20 LAB — CULTURE, BLOOD (SINGLE)
Culture: NO GROWTH
Special Requests: ADEQUATE

## 2018-06-20 MED ORDER — HYDRALAZINE HCL 25 MG PO TABS
25.0000 mg | ORAL_TABLET | Freq: Three times a day (TID) | ORAL | Status: DC
  Administered 2018-06-20 (×2): 25 mg via ORAL
  Filled 2018-06-20 (×2): qty 1

## 2018-06-20 MED ORDER — SODIUM CHLORIDE 0.9% FLUSH: 10.0000 mL | INTRAVENOUS | Status: DC | PRN

## 2018-06-20 MED ORDER — NITROGLYCERIN 2 % TD OINT
0.5000 [in_us] | TOPICAL_OINTMENT | Freq: Once | TRANSDERMAL | Status: AC
  Administered 2018-06-20: 0.5 [in_us] via TOPICAL
  Filled 2018-06-20: qty 1

## 2018-06-20 MED ORDER — VANCOMYCIN IV (FOR PTA / DISCHARGE USE ONLY): 1000.0000 mg | INTRAVENOUS | 0 refills | Status: DC

## 2018-06-20 MED ORDER — SODIUM CHLORIDE 0.9% FLUSH: 10.0000 mL | Freq: Two times a day (BID) | INTRAVENOUS | Status: DC

## 2018-06-20 MED ORDER — PREMIER PROTEIN SHAKE: 11.0000 [oz_av] | Freq: Two times a day (BID) | ORAL | 0 refills | Status: AC

## 2018-06-20 MED ORDER — AMLODIPINE BESYLATE 10 MG PO TABS
10.0000 mg | ORAL_TABLET | Freq: Every day | ORAL | Status: DC
  Administered 2018-06-20 (×2): 10 mg via ORAL
  Filled 2018-06-20 (×2): qty 1

## 2018-06-20 MED ORDER — OXYCODONE-ACETAMINOPHEN 10-325 MG PO TABS: 1.0000 | ORAL_TABLET | Freq: Four times a day (QID) | ORAL | 0 refills | Status: DC | PRN

## 2018-06-20 MED ORDER — AMLODIPINE BESYLATE 10 MG PO TABS: 10.0000 mg | ORAL_TABLET | Freq: Every day | ORAL | 3 refills | Status: DC

## 2018-06-20 MED ORDER — HYDRALAZINE HCL 25 MG PO TABS: 25.0000 mg | ORAL_TABLET | Freq: Three times a day (TID) | ORAL | 2 refills | Status: DC

## 2018-06-20 MED ORDER — POTASSIUM CHLORIDE CRYS ER 20 MEQ PO TBCR
40.0000 meq | EXTENDED_RELEASE_TABLET | Freq: Once | ORAL | Status: AC
  Administered 2018-06-20: 40 meq via ORAL
  Filled 2018-06-20: qty 2

## 2018-06-20 MED ORDER — ALPRAZOLAM 0.25 MG PO TABS: 0.2500 mg | ORAL_TABLET | Freq: Three times a day (TID) | ORAL | 0 refills | Status: DC | PRN

## 2018-06-20 MED ORDER — AMLODIPINE BESYLATE 10 MG PO TABS: 10.0000 mg | ORAL_TABLET | Freq: Every day | ORAL | Status: DC

## 2018-06-20 MED ORDER — INSULIN GLARGINE 100 UNIT/ML ~~LOC~~ SOLN: 25.0000 [IU] | Freq: Every day | SUBCUTANEOUS | 11 refills | Status: DC

## 2018-06-20 MED ORDER — ASCORBIC ACID 250 MG PO TABS: 250.0000 mg | ORAL_TABLET | Freq: Two times a day (BID) | ORAL | 0 refills | Status: DC

## 2018-06-20 NOTE — Progress Notes (Signed)
[facility]                                                      Patient Demographics  Shawn [name], is a 32 y.o. male   MRN: [id number]   DOB - [birth year]  Admit Date - 06/14/2018    Outpatient Primary MD for the patient is Olmedo, Guy Begin, MD  Consult requested in the Hospital by Gladstone Lighter, MD, On 06/20/2018   With History of -  Past Medical History:  Diagnosis Date  . CKD (chronic kidney disease)   . Diabetes mellitus without complication ([place])   . GERD (gastroesophageal reflux disease)   . IBS (irritable bowel syndrome)   . Osteomyelitis [facility])       Past Surgical History:  Procedure Laterality Date  . ABDOMINAL AORTOGRAM W/LOWER EXTREMITY Right 12/23/2017   Procedure: ABDOMINAL AORTOGRAM W/LOWER EXTREMITY;  Surgeon: Katha Cabal, MD;  Location: [place];  Service: Cardiovascular;  Laterality: Right;  . ACHILLES TENDON SURGERY Bilateral 06/16/2018   Procedure: ACHILLES LENGTHENING/KIDNER;  Surgeon: Albertine Patricia, DPM;  Location: [facility];  Service: Podiatry;  Laterality: Bilateral;  . AMPUTATION Right 12/24/2017   Procedure: AMPUTATION RAY;  Surgeon: Sharlotte Alamo, DPM;  Location: [facility];  Service: Podiatry;  Laterality: Right;  . AMPUTATION Left 04/06/2018   Procedure: AMPUTATION RAY;  Surgeon: Sharlotte Alamo, DPM;  Location: [facility];  Service: Podiatry;  Laterality: Left;  . AMPUTATION Left 04/09/2018   Procedure: AMPUTATION RAY;  Surgeon: Sharlotte Alamo, DPM;  Location: [facility];  Service: Podiatry;  Laterality: Left;  . APPLICATION OF WOUND VAC Right 12/24/2017   Procedure: APPLICATION OF WOUND VAC;  Surgeon: Sharlotte Alamo, DPM;  Location: [facility];  Service: Podiatry;  Laterality: Right;  . APPLICATION OF WOUND VAC Right 01/01/2018   Procedure: APPLICATION OF WOUND VAC;  Surgeon: Albertine Patricia, DPM;  Location:  [facility];  Service: Podiatry;  Laterality: Right;  . BONE EXCISION Bilateral 06/16/2018   Procedure: BONE EXCISION AND SOFT TISSUE;  Surgeon: Albertine Patricia, DPM;  Location: [facility];  Service: Podiatry;  Laterality: Bilateral;  . IRRIGATION AND DEBRIDEMENT FOOT Right 12/20/2017   Procedure: IRRIGATION AND DEBRIDEMENT FOOT;  Surgeon: Sharlotte Alamo, DPM;  Location: [facility];  Service: Podiatry;  Laterality: Right;  . IRRIGATION AND DEBRIDEMENT FOOT Right 12/24/2017   Procedure: IRRIGATION AND DEBRIDEMENT FOOT;  Surgeon: Sharlotte Alamo, DPM;  Location: [facility];  Service: Podiatry;  Laterality: Right;  . IRRIGATION AND DEBRIDEMENT FOOT Right 01/01/2018   Procedure: IRRIGATION AND DEBRIDEMENT FOOT-SKIN,SOFT TISSUE AND BONE;  Surgeon: Albertine Patricia, DPM;  Location: [facility];  Service: Podiatry;  Laterality: Right;  . IRRIGATION AND DEBRIDEMENT FOOT Left 04/06/2018   Procedure: IRRIGATION AND DEBRIDEMENT FOOT;  Surgeon: Sharlotte Alamo, DPM;  Location: [facility];  Service: Podiatry;  Laterality: Left;  . LOWER EXTREMITY ANGIOGRAPHY Left 04/06/2018   Procedure: Lower Extremity Angiography;  Surgeon: Algernon Huxley, MD;  Location: [place] [facility];  Service: Cardiovascular;  Laterality: Left;    in for   Chief Complaint  Patient presents with  . Tachycardia  . Nausea  . Foot Pain  . Emesis     HPI  Shawn [name]  is a 32 y.o. male, 4 days status post percutaneous Achilles lengthening and resection of infected bone from both feet  Vitals  Blood pressure (!) 159/102, pulse 89, temperature 98.9 F (37.2 C), temperature source Oral, resp. rate 17, height 5\' 6"  (1.676 m), weight 52.2 kg, SpO2 98 %.  Lower Extremity exam: Dressings changed today but there is look stable.  He is got his cam walking boots on.  He had physical therapy yesterday which has helped him to be get up and move and transfer to chair.  Physical still in physical therapy at home.  White count is down to normal at this point.   Data Review  CBC Recent Labs  Lab 06/14/18 1408 06/15/18 0512 06/16/18 0355 06/18/18 0403 06/19/18 0526 06/20/18 0513  WBC 21.2* 12.3* 6.2 8.0 7.3 7.4  HGB 11.5* 11.2* 9.5* 7.9* 7.6* 7.7*  HCT 35.2* 35.3* 30.5* 25.4* 24.6* 24.4*  PLT 404* 333 245 213 215 247  MCV 79.6* 81.5 83.6 83.0 82.3 81.1  MCH 26.0 25.9* 26.0 25.8* 25.4* 25.6*  MCHC 32.7 31.7 31.1 31.1 30.9 31.6  RDW 13.9 13.8 13.8 13.7 13.8 13.8  LYMPHSABS 1.2  --   --   --   --   --   MONOABS 1.2*  --   --   --   --   --   EOSABS 0.0  --   --   --   --   --   BASOSABS 0.1  --   --   --   --   --    ------------------------------------------------------------------------------------------------------------------  Chemistries  Recent Labs  Lab 06/14/18 1408 06/15/18 0512 06/16/18 0355 06/18/18 0403 06/20/18 0513  NA 129* 134* 135 137 137  K 4.5 4.0 3.3* 3.1* 3.2*  CL 90* 99 103 107 109  CO2 24 26 26 22 23   GLUCOSE 515* 262* 134* 154* 258*  BUN 20 20 22* 23* 19  CREATININE 2.14* 1.73* 1.98* 1.63* 1.66*  CALCIUM 8.7* 8.6* 8.0* 7.6* 7.7*  AST 17  --   --   --   --   ALT 9  --   --   --   --   ALKPHOS 160*  --   --   --   --   BILITOT 0.8  --   --   --   --    ------------------------------------------------------------------------------------------------------------------ estimated creatinine clearance is 48 mL/min (A) (by C-G formula based on SCr of 1.66 mg/dL (H)). ------------------------------------------------------------------------------------------------------------------ No results for input(s): TSH, T4TOTAL, T3FREE, THYROIDAB in the last 72 hours.  Invalid input(s): FREET3 Urinalysis    Component Value Date/Time   COLORURINE YELLOW (A) 06/14/2018 1945   APPEARANCEUR HAZY (A) 06/14/2018 1945   APPEARANCEUR Clear 10/06/2014 0758   LABSPEC 1.020 06/14/2018 1945   LABSPEC 1.031 10/06/2014 0758   PHURINE 5.0 06/14/2018 1945   GLUCOSEU >=500 (A) 06/14/2018 1945   GLUCOSEU >=500 10/06/2014 0758    HGBUR MODERATE (A) 06/14/2018 1945   BILIRUBINUR NEGATIVE 06/14/2018 1945   BILIRUBINUR Negative 10/06/2014 0758   KETONESUR 5 (A) 06/14/2018 1945   PROTEINUR >=300 (A) 06/14/2018 1945   UROBILINOGEN 0.2 03/29/2007 1506   NITRITE NEGATIVE 06/14/2018 1945   LEUKOCYTESUR NEGATIVE 06/14/2018 1945   LEUKOCYTESUR Negative 10/06/2014 0758     Imaging results:   [place] Ekg Site Rite  Result Date: 06/19/2018 If Site Rite image not attached, placement could not be confirmed due to current cardiac rhythm.   Assessment & Plan: Okay from my standpoint for discharge once appropriate antibiotics are selected.  Okay for him to have bathroom privileges using a walker he will likely need  assistance when he does this at home.  He will likely need further physical therapy treatment at home.  I also like dressings changed every other day on both feet while he is at home.  I will follow him again in about 2 weeks in the office.  Call if there is any questions about his care.  Dressings need to be just a wet-to-dry dressing to the ulcers on the sides of both feet with heavy Kerlix padding.  Boots should be on anytime his feet are on the floor.  Active Problems:   Sepsis [facility])   Family Communication: Plan discussed with patient and **  Albertine Patricia M.D on 06/20/2018 at 8:13 AM  Thank you for the consult, we will follow the patient with you in the Hospital.

## 2018-06-20 NOTE — Progress Notes (Signed)
Callled Dr. Marcille Blanco regarding patient's elevated blood pressure even after clonidine, nitro patch, and labatelol were given.  Appropriate orders were placed.  Phoebe Sharps N  06/20/2018 4:06 AM

## 2018-06-20 NOTE — Discharge Summary (Signed)
[facility] NAME: Gunnard Dorrance    MR#:  [id number]  DATE OF BIRTH:  21-Feb-1986  DATE OF ADMISSION:  06/14/2018   ADMITTING PHYSICIAN: Loletha Grayer, MD  DATE OF DISCHARGE:  06/20/18  PRIMARY CARE PHYSICIAN: Valera Castle, MD   ADMISSION DIAGNOSIS:   Dehydration, moderate [E86.0] AKI (acute kidney injury) ([place]) [N17.9] Sepsis, due to unspecified organism, unspecified whether acute organ dysfunction present ([place]) [A41.9]  DISCHARGE DIAGNOSIS:   Active Problems:   Sepsis ([place])   SECONDARY DIAGNOSIS:   Past Medical History:  Diagnosis Date  . CKD (chronic kidney disease)   . Diabetes mellitus without complication ([place])   . GERD (gastroesophageal reflux disease)   . IBS (irritable bowel syndrome)   . Osteomyelitis [facility])     HOSPITAL COURSE:    32 year old male with past medical history significant for type 1 diabetes mellitus, neuropathy, gastroparesis comes in with bilateral feet pain and noted to have MRSA bacteremia and osteomyelitis.  1.  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  Patient will be discharged on IV vancomycin for total of 4 to 6 weeks. -Patient is status post bony debridement along the lateral border of both feet and bilateral percutaneous Achilles lengthenings. -Nonweightbearing both legs, currently in splints.   -Has received postop boots.-Management per podiatry.   -They have recommended dressing changes every other day.  Minimal weightbearing and okay to have bathroom privileges using a walker. -Continue pain control and antibiotics  2.  Type 1 diabetes mellitus-currently on Lantus, also on Novolin insulin 3 times a day per home sliding scale  3.  Depression and anxiety-currently only on Xanax for anxiety -Continue  outpatient follow-up  4.  Anemia of chronic disease- dropping steadily, feels weak and light headed after surgery.  1 unit packed RBC transfusion ordered prior to discharge. - no active bleeding.  -Continue iron supplements  5.  Acute renal failure on CKD stage III, baseline creatinine seems to be around 1.7. -Improved with IV fluids  6.  Hypertension-elevated blood pressure in the hospital.  Started on Norvasc and hydralazine orally  Will be discharged home with home health today  DISCHARGE CONDITIONS:   Guarded  CONSULTS OBTAINED:   Treatment Team:  Albertine Patricia, DPM Leonel Ramsay, MD  DRUG ALLERGIES:   Allergies  Allergen Reactions  . Banana Hives, Nausea And Vomiting and Rash  . Keflex [Cephalexin] Rash    No swelling- also taken penicillin without any issue.  . Onion Hives, Nausea And Vomiting and Rash  . Sulfa Antibiotics Anaphylaxis  . Grapeseed Extract [Nutritional Supplements] Itching  . Shellfish Allergy Hives    "ALL SEAFOOD"   DISCHARGE MEDICATIONS:   Allergies as of 06/20/2018      Reactions   Banana Hives, Nausea And Vomiting, Rash   Keflex [cephalexin] Rash   No swelling- also taken penicillin without any issue.   Onion Hives, Nausea And Vomiting, Rash   Sulfa Antibiotics Anaphylaxis   Grapeseed Extract [nutritional Supplements] Itching   Shellfish Allergy Hives   "ALL SEAFOOD"      Medication List    STOP taking these medications   amoxicillin-clavulanate 875-125 MG tablet Commonly known as:  AUGMENTIN   FLUoxetine 20 MG capsule Commonly known as:  PROZAC   lisinopril 20 MG tablet Commonly known as:  PRINIVIL,ZESTRIL   oxyCODONE-acetaminophen 5-325  MG tablet Commonly known as:  PERCOCET/ROXICET Replaced by:  oxyCODONE-acetaminophen 10-325 MG tablet   potassium chloride SA 20 MEQ tablet Commonly known as:  K-DUR,KLOR-CON     TAKE these medications   ALPRAZolam 0.25 MG tablet Commonly known as:  XANAX Take 1 tablet  (0.25 mg total) by mouth 3 (three) times daily as needed for anxiety.   amLODipine 10 MG tablet Commonly known as:  NORVASC Take 1 tablet (10 mg total) by mouth daily. Start taking on:  June 21, 2018   ascorbic acid 250 MG tablet Commonly known as:  VITAMIN C Take 1 tablet (250 mg total) by mouth 2 (two) times daily.   diphenoxylate-atropine 2.5-0.025 MG tablet Commonly known as:  LOMOTIL Take 1 tablet by mouth 4 (four) times daily as needed for diarrhea or loose stools.   ferrous sulfate 325 (65 FE) MG tablet Take 1 tablet (325 mg total) by mouth 2 (two) times daily with a meal.   hydrALAZINE 25 MG tablet Commonly known as:  APRESOLINE Take 1 tablet (25 mg total) by mouth every 8 (eight) hours.   insulin glargine 100 UNIT/ML injection Commonly known as:  LANTUS Inject 0.25 mLs (25 Units total) into the skin at bedtime. What changed:  how much to take   Insulin Syringes (Disposable) U-100 0.3 ML Misc 1 Syringe by Does not apply route 4 (four) times daily -  with meals and at bedtime.   NOVOLIN R RELION 100 units/mL injection Generic drug:  insulin regular Inject 0-0.1 mLs (0-10 Units total) into the skin 3 (three) times daily as needed for high blood sugar (sliding scale).   oxyCODONE-acetaminophen 10-325 MG tablet Commonly known as:  PERCOCET Take 1 tablet by mouth every 6 (six) hours as needed for pain. Replaces:  oxyCODONE-acetaminophen 5-325 MG tablet   protein supplement shake Liqd Commonly known as:  PREMIER PROTEIN Take 325 mLs (11 oz total) by mouth 2 (two) times daily between meals.   vancomycin  IVPB Inject 1,000 mg into the vein daily. Indication: MRSA bacteremia, Diabetic foot infection and osteomyelitis Last Day of Therapy: 07/27/2018 Labs - 'Sunday/Monday:  CBC/D, BMP, and vancomycin trough. Labs - Thursday:  BMP and vancomycin trough Labs - Every other week:  ESR and CRP            Home Infusion Instuctions  (From admission, onward)          Start     Ordered   06/20/18 0000  Home infusion instructions [facility] May follow [facility] Dosing Protocol; May administer Cathflo as needed to maintain patency of vascular access device.; Flushing of vascular access device: per [facility] Protocol: 0.9% NaCl pre/post medica...    Question Answer Comment  Instructions May follow [facility] Dosing Protocol   Instructions May administer Cathflo as needed to maintain patency of vascular access device.   Instructions Flushing of vascular access device: per [facility] Protocol: 0.9% NaCl pre/post medication administration and prn patency; Heparin 100 u/ml, 5ml for implanted ports and Heparin 10u/ml, 5ml for all other central venous catheters.   Instructions May follow [facility] Anaphylaxis Protocol for First Dose Administration in the home: 0.9% NaCl at 25-50 ml/hr to maintain IV access for protocol meds. Epinephrine 0.3 ml IV/IM PRN and Benadryl 25-50 IV/IM PRN s/s of anaphylaxis.   Instructions [facility] Infusion Coordinator (RN) to assist per patient IV care needs in the home PRN.      12'$ /17/19 1404  DISCHARGE INSTRUCTIONS:   1.  PCP follow-up in 1 to 2 weeks 2.  Podiatry follow-up in 2 weeks 3.  ID follow-up in 3 weeks  DIET:   Cardiac diet and Diabetic diet  ACTIVITY:   Activity as tolerated  OXYGEN:   Home Oxygen: No.  Oxygen Delivery: room air  DISCHARGE LOCATION:   home   If you experience worsening of your admission symptoms, develop shortness of breath, life threatening emergency, suicidal or homicidal thoughts you must seek medical attention immediately by calling 911 or calling your MD immediately  if symptoms less severe.  You Must read complete instructions/literature along with all the possible adverse reactions/side effects for all the Medicines you take and that have been prescribed to you. Take any new Medicines after you have completely understood and accpet all the possible adverse reactions/side  effects.   Please note  You were cared for by a hospitalist during your hospital stay. If you have any questions about your discharge medications or the care you received while you were in the hospital after you are discharged, you can call the unit and asked to speak with the hospitalist on call if the hospitalist that took care of you is not available. Once you are discharged, your primary care physician will handle any further medical issues. Please note that NO REFILLS for any discharge medications will be authorized once you are discharged, as it is imperative that you return to your primary care physician (or establish a relationship with a primary care physician if you do not have one) for your aftercare needs so that they can reassess your need for medications and monitor your lab values.    On the day of Discharge:  VITAL SIGNS:   Blood pressure (!) 143/99, pulse 97, temperature 98.9 F (37.2 C), temperature source Oral, resp. rate 14, height '5\' 6"'$  (1.676 m), weight 52.2 kg, SpO2 100 %.  PHYSICAL EXAMINATION:   GENERAL:  32 y.o.-year-old thin built patient lying in the bed with no acute distress.  EYES: Pupils equal, round, reactive to light and accommodation. No scleral icterus. Extraocular muscles intact.  HEENT: Head atraumatic, normocephalic. Oropharynx and nasopharynx clear.  NECK:  Supple, no jugular venous distention. No thyroid enlargement, no tenderness.  LUNGS: Normal breath sounds bilaterally, no wheezing, rales,rhonchi or crepitation. No use of accessory muscles of respiration.  CARDIOVASCULAR: S1, S2 normal. No murmurs, rubs, or gallops.  ABDOMEN: Soft, nontender, nondistended. Bowel sounds present. No organomegaly or mass.  EXTREMITIES: Both feet are in splints and heavy dressing present NEUROLOGIC: Cranial nerves II through XII are intact. Muscle strength 5/5 in all extremities. Sensation intact. Gait not checked.  PSYCHIATRIC: The patient is alert and oriented x 3.    SKIN: No obvious rash, lesion, or ulcer.   DATA REVIEW:   CBC Recent Labs  Lab 06/20/18 0513  WBC 7.4  HGB 7.7*  HCT 24.4*  PLT 247    Chemistries  Recent Labs  Lab 06/14/18 1408  06/20/18 0513  NA 129*   < > 137  K 4.5   < > 3.2*  CL 90*   < > 109  CO2 24   < > 23  GLUCOSE 515*   < > 258*  BUN 20   < > 19  CREATININE 2.14*   < > 1.66*  CALCIUM 8.7*   < > 7.7*  AST 17  --   --   ALT 9  --   --   ALKPHOS 160*  --   --  BILITOT 0.8  --   --    < > = values in this interval not displayed.     Microbiology Results  Results for orders placed or performed during the hospital encounter of 06/14/18  Blood culture (single)     Status: Abnormal   Collection Time: 06/14/18  2:09 PM  Result Value Ref Range Status   Specimen Description   Final    BLOOD RIGHT ANTECUBITAL Performed at [facility], [address] [place], [place] [address]    Special Requests   Final    BOTTLES DRAWN AEROBIC AND ANAEROBIC Blood Culture adequate volume Performed at [facility], [address] [place], [place] [address]    Culture  Setup Time   Final    GRAM POSITIVE COCCI AEROBIC BOTTLE ONLY CRITICAL RESULT CALLED TO, READ BACK BY AND VERIFIED WITH:  [facility] SLAUGHTER AT 1030 06/15/18 SDR Performed at [facility], [place] [address], [place], [place] [address]    Culture METHICILLIN RESISTANT STAPHYLOCOCCUS AUREUS (A)  Final   Report Status 06/17/2018 FINAL  Final   Organism ID, Bacteria METHICILLIN RESISTANT STAPHYLOCOCCUS AUREUS  Final      Susceptibility   Methicillin resistant staphylococcus aureus - MIC*    CIPROFLOXACIN <=0.5 SENSITIVE Sensitive     ERYTHROMYCIN 1 INTERMEDIATE Intermediate     GENTAMICIN <=0.5 SENSITIVE Sensitive     OXACILLIN >=4 RESISTANT Resistant     TETRACYCLINE <=1 SENSITIVE Sensitive     VANCOMYCIN <=0.5 SENSITIVE Sensitive     TRIMETH/SULFA <=10 SENSITIVE Sensitive     CLINDAMYCIN <=0.25 SENSITIVE Sensitive     RIFAMPIN  <=0.5 SENSITIVE Sensitive     Inducible Clindamycin NEGATIVE Sensitive     * METHICILLIN RESISTANT STAPHYLOCOCCUS AUREUS  Blood Culture ID Panel (Reflexed)     Status: Abnormal   Collection Time: 06/14/18  2:09 PM  Result Value Ref Range Status   Enterococcus species NOT DETECTED NOT DETECTED Final   Listeria monocytogenes NOT DETECTED NOT DETECTED Final   Staphylococcus species DETECTED (A) NOT DETECTED Final    Comment: CRITICAL RESULT CALLED TO, READ BACK BY AND VERIFIED WITH:  [facility] SLAUGHTER AT 1030 06/15/18 SDR    Staphylococcus aureus (BCID) DETECTED (A) NOT DETECTED Final    Comment: Methicillin (oxacillin)-resistant Staphylococcus aureus (MRSA). MRSA is predictably resistant to beta-lactam antibiotics (except ceftaroline). Preferred therapy is vancomycin unless clinically contraindicated. Patient requires contact precautions if  hospitalized. CRITICAL RESULT CALLED TO, READ BACK BY AND VERIFIED WITH:  MYRA SLAUGHTER AT 1030 06/15/18 SDR    Methicillin resistance DETECTED (A) NOT DETECTED Final    Comment: CRITICAL RESULT CALLED TO, READ BACK BY AND VERIFIED WITH:  MYRA SLAUGHTER AT 1030 06/15/18 SDR    Streptococcus species NOT DETECTED NOT DETECTED Final   Streptococcus agalactiae NOT DETECTED NOT DETECTED Final   Streptococcus pneumoniae NOT DETECTED NOT DETECTED Final   Streptococcus pyogenes NOT DETECTED NOT DETECTED Final   Acinetobacter baumannii NOT DETECTED NOT DETECTED Final   Enterobacteriaceae species NOT DETECTED NOT DETECTED Final   Enterobacter cloacae complex NOT DETECTED NOT DETECTED Final   Escherichia coli NOT DETECTED NOT DETECTED Final   Klebsiella oxytoca NOT DETECTED NOT DETECTED Final   Klebsiella pneumoniae NOT DETECTED NOT DETECTED Final   Proteus species NOT DETECTED NOT DETECTED Final   Serratia marcescens NOT DETECTED NOT DETECTED Final   Haemophilus influenzae NOT DETECTED NOT DETECTED Final   Neisseria meningitidis NOT DETECTED NOT DETECTED  Final   Pseudomonas aeruginosa NOT  DETECTED NOT DETECTED Final   Candida albicans NOT DETECTED NOT DETECTED Final   Candida glabrata NOT DETECTED NOT DETECTED Final   Candida krusei NOT DETECTED NOT DETECTED Final   Candida parapsilosis NOT DETECTED NOT DETECTED Final   Candida tropicalis NOT DETECTED NOT DETECTED Final    Comment: Performed at [facility], [address] [place], [place] [address]  Blood Culture (routine x 2)     Status: None   Collection Time: 06/14/18  5:01 PM  Result Value Ref Range Status   Specimen Description BLOOD BLOOD LEFT HAND  Final   Special Requests   Final    BOTTLES DRAWN AEROBIC AND ANAEROBIC Blood Culture results may not be optimal due to an inadequate volume of blood received in culture bottles   Culture   Final    NO GROWTH 5 DAYS Performed at [facility], [address], [place], [place] [address]    Report Status 06/19/2018 FINAL  Final  Blood Culture (routine x 2)     Status: None   Collection Time: 06/14/18  5:01 PM  Result Value Ref Range Status   Specimen Description BLOOD BLOOD RIGHT HAND  Final   Special Requests   Final    BOTTLES DRAWN AEROBIC AND ANAEROBIC Blood Culture results may not be optimal due to an inadequate volume of blood received in culture bottles   Culture   Final    NO GROWTH 5 DAYS Performed at [facility], [address], [place], [place] [address]    Report Status 06/19/2018 FINAL  Final  Culture, blood (single) w Reflex to ID Panel     Status: None   Collection Time: 06/15/18 11:53 AM  Result Value Ref Range Status   Specimen Description BLOOD BLOOD LEFT FOREARM  Final   Special Requests   Final    BOTTLES DRAWN AEROBIC AND ANAEROBIC Blood Culture adequate volume   Culture   Final    NO GROWTH 5 DAYS Performed at [facility], [address], [place], [place] [address]    Report Status 06/20/2018 FINAL  Final  Aerobic/Anaerobic Culture (surgical/deep wound)      Status: None (Preliminary result)   Collection Time: 06/16/18 12:59 PM  Result Value Ref Range Status   Specimen Description   Final    BONE FOOT RIGHT Performed at [facility], [address], [place], [place] [address]    Special Requests   Final    NONE Performed at [facility], [address] [place], [place] [address]    Gram Stain   Final    RARE WBC SEEN RARE GRAM POSITIVE COCCI Results Called to: Delena Serve 06/16/18 1410 JGF/SDR Performed at [facility], [address] [place], [place] [address]    Culture   Final    FEW STREPTOCOCCUS MITIS/ORALIS FEW METHICILLIN RESISTANT STAPHYLOCOCCUS AUREUS NO ANAEROBES ISOLATED; CULTURE IN PROGRESS FOR 5 DAYS    Report Status PENDING  Incomplete   Organism ID, Bacteria STREPTOCOCCUS MITIS/ORALIS  Final   Organism ID, Bacteria METHICILLIN RESISTANT STAPHYLOCOCCUS AUREUS  Final      Susceptibility   Methicillin resistant staphylococcus aureus - MIC*    CIPROFLOXACIN <=0.5 SENSITIVE Sensitive     ERYTHROMYCIN 1 INTERMEDIATE Intermediate     GENTAMICIN <=0.5 SENSITIVE Sensitive     OXACILLIN >=4 RESISTANT Resistant     TETRACYCLINE <=1 SENSITIVE Sensitive     VANCOMYCIN 1 SENSITIVE Sensitive     TRIMETH/SULFA <=10 SENSITIVE Sensitive  CLINDAMYCIN <=0.25 SENSITIVE Sensitive     RIFAMPIN <=0.5 SENSITIVE Sensitive     Inducible Clindamycin NEGATIVE Sensitive     * FEW METHICILLIN RESISTANT STAPHYLOCOCCUS AUREUS   Streptococcus mitis/oralis - MIC*    TETRACYCLINE 1 SENSITIVE Sensitive     VANCOMYCIN 0.5 SENSITIVE Sensitive     CLINDAMYCIN <=0.25 SENSITIVE Sensitive     * FEW STREPTOCOCCUS MITIS/ORALIS  Aerobic/Anaerobic Culture (surgical/deep wound)     Status: None (Preliminary result)   Collection Time: 06/16/18  1:21 PM  Result Value Ref Range Status   Specimen Description   Final    BONE Performed at [facility], [address], [place], [place] [address]    Special  Requests   Final    LEFT FOOT Performed at [facility], [address] [place], [place] [address]    Gram Stain   Final    RARE WBC PRESENT, PREDOMINANTLY MONONUCLEAR NO ORGANISMS SEEN    Culture   Final    RARE METHICILLIN RESISTANT STAPHYLOCOCCUS AUREUS NO ANAEROBES ISOLATED; CULTURE IN PROGRESS FOR 5 DAYS CRITICAL RESULT CALLED TO, READ BACK BY AND VERIFIED WITH: RN T TURNER 06/20/18 AT 912 AM BY CM Performed at [facility], [place] [address], [place], [place] [address]    Report Status PENDING  Incomplete   Organism ID, Bacteria METHICILLIN RESISTANT STAPHYLOCOCCUS AUREUS  Final      Susceptibility   Methicillin resistant staphylococcus aureus - MIC*    CIPROFLOXACIN <=0.5 SENSITIVE Sensitive     ERYTHROMYCIN 1 INTERMEDIATE Intermediate     GENTAMICIN <=0.5 SENSITIVE Sensitive     OXACILLIN RESISTANT Resistant     TETRACYCLINE <=1 SENSITIVE Sensitive     VANCOMYCIN 1 SENSITIVE Sensitive     TRIMETH/SULFA <=10 SENSITIVE Sensitive     CLINDAMYCIN <=0.25 SENSITIVE Sensitive     RIFAMPIN <=0.5 SENSITIVE Sensitive     Inducible Clindamycin NEGATIVE Sensitive     * RARE METHICILLIN RESISTANT STAPHYLOCOCCUS AUREUS    RADIOLOGY:  No results found.   Management plans discussed with the patient, family and they are in agreement.  CODE STATUS:     Code Status Orders  (From admission, onward)         Start     Ordered   06/14/18 1744  Full code  Continuous     06/14/18 1743        Code Status History    Date Active Date Inactive Code Status Order ID Comments User Context   05/28/2018 0437 05/28/2018 1926 Full Code [id number]  Arta Silence, MD ED   04/27/2018 1726 05/02/2018 1713 Full Code [id number]  Henreitta Leber, MD Inpatient   04/05/2018 1724 04/11/2018 2142 Full Code [id number]  Dustin Flock, MD Inpatient   02/06/2018 2022 02/07/2018 1512 Full Code [id number]  Demetrios Loll, MD Inpatient   01/10/2018 2259 01/11/2018 1947 Full Code [id number]  Amelia Jo, MD Inpatient   12/30/2017 0019 01/03/2018 1457 Full Code [id number]  Harrie Foreman, MD Inpatient   12/20/2017 1541 12/27/2017 2147 Full Code [id number]  Sharlotte Alamo, [facility] Inpatient   11/10/2017 0049 11/11/2017 1632 Full Code [id number]  Amelia Jo, MD Inpatient   07/13/2017 2112 07/15/2017 1730 Full Code [id number]  Jules Husbands, MD ED      TOTAL TIME TAKING CARE OF THIS PATIENT: 38 minutes.    Gladstone Lighter M.D on 06/20/2018 at 3:12 PM  Between 7am to 6pm - Pager - [phone]  After 6pm go to [email or web address] - password [email or web address]  [facility]  Office  [phone]  CC: Primary care physician; Valera Castle, MD   Note: This dictation was prepared with Dragon dictation along with smaller phrase technology. Any transcriptional errors that result from this process are unintentional.

## 2018-06-20 NOTE — Care Management (Signed)
Plan for patient to discharge today with home IV antibiotics with [facility].  RNCM attempted to follow up with patient, however he is currently having a PICC placed.  Will follow up at a later time

## 2018-06-20 NOTE — Progress Notes (Signed)
Peripherally Inserted Central Catheter/Midline Placement  The IV Nurse has discussed with the patient and/or persons authorized to consent for the patient, the purpose of this procedure and the potential benefits and risks involved with this procedure.  The benefits include less needle sticks, lab draws from the catheter, and the patient may be discharged home with the catheter. Risks include, but not limited to, infection, bleeding, blood clot (thrombus formation), and puncture of an artery; nerve damage and irregular heartbeat and possibility to perform a PICC exchange if needed/ordered by physician.  Alternatives to this procedure were also discussed.  Bard Power PICC patient education guide, fact sheet on infection prevention and patient information card has been provided to patient /or left at bedside.    PICC/Midline Placement Documentation        Shawn [name] 06/20/2018, 12:06 PM

## 2018-06-20 NOTE — Progress Notes (Signed)
Called Dr. Jannifer Franklin regarding patient's elevated blood pressure.   Appropriate orders were placed.  Phoebe Sharps N  06/20/2018  1:22 AM

## 2018-06-21 LAB — AEROBIC/ANAEROBIC CULTURE W GRAM STAIN (SURGICAL/DEEP WOUND)

## 2018-06-21 LAB — TYPE AND SCREEN
ABO/RH(D): A NEG
Antibody Screen: NEGATIVE
Unit division: 0

## 2018-06-21 LAB — BPAM RBC
Blood Product Expiration Date: 202001042359
ISSUE DATE / TIME: 201912171322
Unit Type and Rh: 600

## 2018-06-21 LAB — SURGICAL PATHOLOGY

## 2018-06-21 LAB — AEROBIC/ANAEROBIC CULTURE (SURGICAL/DEEP WOUND)

## 2018-06-21 NOTE — Care Management Note (Signed)
Case Management Note  Patient Details  Name: RENZO VINCELETTE MRN: [id number] Date of Birth: 08-Aug-1985   Patient  To discharge today. Patient confirms he does not want an additional equipment in the home.  Patient states that he has RW, and BSC.  PCP [place]. Patient denies issues obtaining his medication or with transportation.  Patient will discharge with IV antibiotics through [facility].  Start of care 12/18.  Patient states that he has administered home antibiotics previously and feels comfortable.    Subjective/Objective:                    Action/Plan:   Expected Discharge Date:  06/20/18               Expected Discharge Plan:  [place]  In-House Referral:     Discharge planning Services  CM Consult  Post Acute Care Choice:  Home Health Choice offered to:  Patient  DME Arranged:    DME Agency:     HH Arranged:  RN Agency [place] Agency:  [place]  Status of Service:  Completed, signed off  If discussed at [place] of Stay Meetings, dates discussed:    Additional Comments:  Beverly Sessions, RN 06/21/2018, 10:04 AM

## 2018-07-03 ENCOUNTER — Encounter: Payer: Self-pay | Admitting: Emergency Medicine

## 2018-07-03 ENCOUNTER — Emergency Department
Admission: EM | Admit: 2018-07-03 | Discharge: 2018-07-03 | Disposition: A | Discharge status: Home / Self Care | Payer: Medicaid Other | Attending: Emergency Medicine | Admitting: Emergency Medicine

## 2018-07-03 ENCOUNTER — Emergency Department: Payer: Medicaid Other

## 2018-07-03 ENCOUNTER — Other Ambulatory Visit: Payer: Self-pay

## 2018-07-03 DIAGNOSIS — Z794 Long term (current) use of insulin: Secondary | ICD-10-CM | POA: Insufficient documentation

## 2018-07-03 DIAGNOSIS — M79604 Pain in right leg: Secondary | ICD-10-CM

## 2018-07-03 DIAGNOSIS — R112 Nausea with vomiting, unspecified: Secondary | ICD-10-CM | POA: Diagnosis present

## 2018-07-03 DIAGNOSIS — N189 Chronic kidney disease, unspecified: Secondary | ICD-10-CM | POA: Insufficient documentation

## 2018-07-03 DIAGNOSIS — E1122 Type 2 diabetes mellitus with diabetic chronic kidney disease: Secondary | ICD-10-CM | POA: Diagnosis not present

## 2018-07-03 DIAGNOSIS — M79605 Pain in left leg: Secondary | ICD-10-CM | POA: Insufficient documentation

## 2018-07-03 DIAGNOSIS — Z79899 Other long term (current) drug therapy: Secondary | ICD-10-CM | POA: Insufficient documentation

## 2018-07-03 LAB — CBC
HCT: 33.4 % — ABNORMAL LOW (ref 39.0–52.0)
Hemoglobin: 10.5 g/dL — ABNORMAL LOW (ref 13.0–17.0)
MCH: 26.7 pg (ref 26.0–34.0)
MCHC: 31.4 g/dL (ref 30.0–36.0)
MCV: 85 fL (ref 80.0–100.0)
NRBC: 0 % (ref 0.0–0.2)
Platelets: 358 10*3/uL (ref 150–400)
RBC: 3.93 MIL/uL — ABNORMAL LOW (ref 4.22–5.81)
RDW: 14.5 % (ref 11.5–15.5)
WBC: 11.1 10*3/uL — ABNORMAL HIGH (ref 4.0–10.5)

## 2018-07-03 LAB — COMPREHENSIVE METABOLIC PANEL
ALT: 25 U/L (ref 0–44)
AST: 20 U/L (ref 15–41)
Albumin: 2.7 g/dL — ABNORMAL LOW (ref 3.5–5.0)
Alkaline Phosphatase: 123 U/L (ref 38–126)
Anion gap: 11 (ref 5–15)
BUN: 29 mg/dL — ABNORMAL HIGH (ref 6–20)
CO2: 22 mmol/L (ref 22–32)
Calcium: 8.5 mg/dL — ABNORMAL LOW (ref 8.9–10.3)
Chloride: 100 mmol/L (ref 98–111)
Creatinine, Ser: 1.55 mg/dL — ABNORMAL HIGH (ref 0.61–1.24)
GFR calc Af Amer: >60 mL/min (ref >60)
GFR calc non Af Amer: 59 mL/min — ABNORMAL LOW (ref >60)
Glucose, Bld: 310 mg/dL — ABNORMAL HIGH (ref 70–99)
Potassium: 4.3 mmol/L (ref 3.5–5.1)
Sodium: 133 mmol/L — ABNORMAL LOW (ref 135–145)
Total Bilirubin: 0.6 mg/dL (ref 0.3–1.2)
Total Protein: 6.7 g/dL (ref 6.5–8.1)

## 2018-07-03 LAB — BLOOD GAS, VENOUS
Acid-Base Excess: 3.2 mmol/L — ABNORMAL HIGH (ref 0.0–2.0)
BICARBONATE: 27.9 mmol/L (ref 20.0–28.0)
FIO2: 0.21
O2 Saturation: 94.9 %
Patient temperature: 37
pCO2, Ven: 42 mmHg — ABNORMAL LOW (ref 44.0–60.0)
pH, Ven: 7.43 (ref 7.250–7.430)
pO2, Ven: 73 mmHg — ABNORMAL HIGH (ref 32.0–45.0)

## 2018-07-03 MED ORDER — HALOPERIDOL LACTATE 5 MG/ML IJ SOLN
5.0000 mg | Freq: Once | INTRAMUSCULAR | Status: AC
  Administered 2018-07-03: 5 mg via INTRAVENOUS
  Filled 2018-07-03: qty 1

## 2018-07-03 MED ORDER — SODIUM CHLORIDE 0.9 % IV BOLUS
1000.0000 mL | Freq: Once | INTRAVENOUS | Status: AC
  Administered 2018-07-03: 1000 mL via INTRAVENOUS

## 2018-07-03 MED ORDER — KETOROLAC TROMETHAMINE 30 MG/ML IJ SOLN
30.0000 mg | Freq: Once | INTRAMUSCULAR | Status: AC
  Administered 2018-07-03: 30 mg via INTRAVENOUS
  Filled 2018-07-03: qty 1

## 2018-07-03 MED ORDER — SODIUM CHLORIDE 0.9 % IV SOLN
1000.0000 mL | Freq: Once | INTRAVENOUS | Status: AC
  Administered 2018-07-03: 1000 mL via INTRAVENOUS

## 2018-07-03 MED ORDER — ONDANSETRON HCL 4 MG/2ML IJ SOLN
4.0000 mg | Freq: Once | INTRAMUSCULAR | Status: AC
  Administered 2018-07-03: 4 mg via INTRAVENOUS
  Filled 2018-07-03: qty 2

## 2018-07-03 MED ORDER — GABAPENTIN 300 MG PO CAPS: 300.0000 mg | ORAL_CAPSULE | Freq: Three times a day (TID) | ORAL | 0 refills | Status: DC

## 2018-07-03 NOTE — Discharge Instructions (Addendum)
Please seek medical attention for any high fevers, chest pain, shortness of breath, change in behavior, persistent vomiting, bloody stool or any other new or concerning symptoms.  

## 2018-07-03 NOTE — ED Notes (Signed)
This RN wrapped pt's feet with a wet to dry gauze, kurlex, and ace bandages. Pt tolerated well.

## 2018-07-03 NOTE — ED Triage Notes (Signed)
Says upper mid abd pain and vomiting for 2 days.  Also says his feet are hurting more than usual today.  Says he has amputations.  Says he is worried about infection and is already being treated via picc line for infection in blood.

## 2018-07-03 NOTE — ED Provider Notes (Signed)
[facility] Emergency Department Provider Note  ____________________________________________   I have reviewed the triage vital signs and the nursing notes.   HISTORY  Chief Complaint Abdominal Pain and Emesis   History limited by: Not Limited   HPI Shawn [name] is a 32 y.o. male who presents to the emergency department today because of primary concern for nausea  Vomiting and diarrhea. He states that it started two days ago. Multiple episodes of non bloody emesis and diarrhea. This has been accompanied by generalized abdominal pain. The patient does feel like he had a fever yesterday. In addition the patient is complaining of worsening pain in his bilateral feet. 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 Is currently on antibiotics through picc line.    Per medical record review patient has a history of recent admission.  Past Medical History:  Diagnosis Date  . CKD (chronic kidney disease)   . Diabetes mellitus without complication ([place])   . GERD (gastroesophageal reflux disease)   . IBS (irritable bowel syndrome)   . Osteomyelitis [facility])     Patient Active Problem List   Diagnosis Date Noted  . Hyperglycemic hyperosmolar nonketotic coma ([place]) 05/28/2018  . Acute renal failure (ARF) ([place]) 04/27/2018  . Left foot infection 04/05/2018  . Foot infection 02/06/2018  . Osteomyelitis ([place]) 01/10/2018  . Cellulitis 12/29/2017  . Sepsis ([place]) 12/18/2017  . Moderate recurrent major depression ([place]) 11/10/2017  . Type 2 diabetes mellitus with hyperosmolar nonketotic hyperglycemia ([place]) 11/09/2017  . History of migraine 07/15/2017  . IBS (irritable bowel syndrome) 07/15/2017  . Protein-calorie malnutrition, severe 07/14/2017  . Abdominal pain 07/13/2017  . GERD (gastroesophageal reflux disease) 12/30/2009  . Diabetes ([place]) 11/25/2009  . MDD (major depressive  disorder) 11/25/2009  . Hypercholesterolemia 03/07/2008    Past Surgical History:  Procedure Laterality Date  . ABDOMINAL AORTOGRAM W/LOWER EXTREMITY Right 12/23/2017   Procedure: ABDOMINAL AORTOGRAM W/LOWER EXTREMITY;  Surgeon: Katha Cabal, MD;  Location: [place] [facility];  Service: Cardiovascular;  Laterality: Right;  . ACHILLES TENDON SURGERY Bilateral 06/16/2018   Procedure: ACHILLES LENGTHENING/KIDNER;  Surgeon: Albertine Patricia, DPM;  Location: [facility];  Service: Podiatry;  Laterality: Bilateral;  . AMPUTATION Right 12/24/2017   Procedure: AMPUTATION RAY;  Surgeon: Sharlotte Alamo, DPM;  Location: [facility];  Service: Podiatry;  Laterality: Right;  . AMPUTATION Left 04/06/2018   Procedure: AMPUTATION RAY;  Surgeon: Sharlotte Alamo, DPM;  Location: [facility];  Service: Podiatry;  Laterality: Left;  . AMPUTATION Left 04/09/2018   Procedure: AMPUTATION RAY;  Surgeon: Sharlotte Alamo, DPM;  Location: [facility];  Service: Podiatry;  Laterality: Left;  . APPLICATION OF WOUND VAC Right 12/24/2017   Procedure: APPLICATION OF WOUND VAC;  Surgeon: Sharlotte Alamo, DPM;  Location: [facility];  Service: Podiatry;  Laterality: Right;  . APPLICATION OF WOUND VAC Right 01/01/2018   Procedure: APPLICATION OF WOUND VAC;  Surgeon: Albertine Patricia, DPM;  Location: [facility];  Service: Podiatry;  Laterality: Right;  . BONE EXCISION Bilateral 06/16/2018   Procedure: BONE EXCISION AND SOFT TISSUE;  Surgeon: Albertine Patricia, DPM;  Location: [facility];  Service: Podiatry;  Laterality: Bilateral;  . IRRIGATION AND DEBRIDEMENT FOOT Right 12/20/2017   Procedure: IRRIGATION AND DEBRIDEMENT FOOT;  Surgeon: Sharlotte Alamo, DPM;  Location: [facility];  Service: Podiatry;  Laterality: Right;  . IRRIGATION AND DEBRIDEMENT FOOT Right 12/24/2017   Procedure: IRRIGATION AND DEBRIDEMENT FOOT;  Surgeon: Sharlotte Alamo, DPM;  Location: [facility];  Service: Podiatry;  Laterality: Right;  . IRRIGATION AND DEBRIDEMENT FOOT Right 01/01/2018    Procedure: IRRIGATION AND DEBRIDEMENT FOOT-SKIN,SOFT TISSUE AND BONE;  Surgeon: Albertine Patricia, DPM;  Location: [facility];  Service: Podiatry;  Laterality: Right;  . IRRIGATION AND DEBRIDEMENT FOOT Left 04/06/2018   Procedure: IRRIGATION AND DEBRIDEMENT FOOT;  Surgeon: Sharlotte Alamo, DPM;  Location: [facility];  Service: Podiatry;  Laterality: Left;  . LOWER EXTREMITY ANGIOGRAPHY Left 04/06/2018   Procedure: Lower Extremity Angiography;  Surgeon: Algernon Huxley, MD;  Location: [place];  Service: Cardiovascular;  Laterality: Left;    Prior to Admission medications   Medication Sig Start Date End Date Taking? Authorizing Provider  ALPRAZolam (XANAX) 0.25 MG tablet Take 1 tablet (0.25 mg total) by mouth 3 (three) times daily as needed for anxiety. 06/20/18   Gladstone Lighter, MD  amLODipine (NORVASC) 10 MG tablet Take 1 tablet (10 mg total) by mouth daily. 06/21/18   Gladstone Lighter, MD  diphenoxylate-atropine (LOMOTIL) 2.5-0.025 MG tablet Take 1 tablet by mouth 4 (four) times daily as needed for diarrhea or loose stools. 04/11/18 04/11/19  Henreitta Leber, MD  ferrous sulfate 325 (65 FE) MG tablet Take 1 tablet (325 mg total) by mouth 2 (two) times daily with a meal. 01/03/18   Sainani, Belia Heman, MD  hydrALAZINE (APRESOLINE) 25 MG tablet Take 1 tablet (25 mg total) by mouth every 8 (eight) hours. 06/20/18   Gladstone Lighter, MD  insulin glargine (LANTUS) 100 UNIT/ML injection Inject 0.25 mLs (25 Units total) into the skin at bedtime. 06/20/18   Gladstone Lighter, MD  Insulin Syringes, Disposable, U-100 0.3 ML MISC 1 Syringe by Does not apply route 4 (four) times daily -  with meals and at bedtime. 12/27/17   Wieting, Richard, MD  NOVOLIN R RELION 100 UNIT/ML injection Inject 0-0.1 mLs (0-10 Units total) into the skin 3 (three) times daily as needed for high blood sugar (sliding scale). 05/02/18 07/01/18  Loletha Grayer, MD  oxyCODONE-acetaminophen (PERCOCET) 10-325 MG tablet Take 1 tablet  by mouth every 6 (six) hours as needed for pain. 06/20/18 06/20/19  Gladstone Lighter, MD  protein supplement shake (PREMIER PROTEIN) LIQD Take 325 mLs (11 oz total) by mouth 2 (two) times daily between meals. 06/20/18 07/20/18  Gladstone Lighter, MD  vancomycin IVPB Inject 1,000 mg into the vein daily. Indication: MRSA bacteremia, Diabetic foot infection and osteomyelitis Last Day of Therapy: 07/27/2018 Labs - Sunday/Monday:  CBC/D, BMP, and vancomycin trough. Labs - Thursday:  BMP and vancomycin trough Labs - Every other week:  ESR and CRP 06/20/18 07/27/18  Gladstone Lighter, MD  vitamin C (VITAMIN C) 250 MG tablet Take 1 tablet (250 mg total) by mouth 2 (two) times daily. 06/20/18   Gladstone Lighter, MD    Allergies Banana; Keflex [cephalexin]; Onion; Sulfa antibiotics; Grapeseed extract [nutritional supplements]; and Shellfish allergy  Family History  Problem Relation Age of Onset  . Diabetes Mother   . Ovarian cancer Mother   . Healthy Father     Social History Social History   Tobacco Use  . Smoking status: Never Smoker  . Smokeless tobacco: Never Used  Substance Use Topics  . Alcohol use: No    Frequency: Never  . Drug use: Yes    Types: Marijuana, PCP    Review of Systems Constitutional: No fever/chills Eyes: No visual changes. ENT: No sore throat. Cardiovascular: Denies chest pain. Respiratory: Denies shortness of breath. Gastrointestinal: Positive for abdominal pain, nausea  and vomiting Genitourinary: Negative for dysuria. Musculoskeletal: Positive for bilateral foot pain Skin: Negative for rash. Neurological: Negative for headaches, focal weakness or numbness.  ____________________________________________   PHYSICAL EXAM:  VITAL SIGNS: ED Triage Vitals  Enc Vitals Group     BP 07/03/18 1236 (!) 126/91     Pulse Rate 07/03/18 1236 (!) 101     Resp 07/03/18 1236 16     Temp 07/03/18 1236 98.2 F (36.8 C)     Temp Source 07/03/18 1236 Oral      SpO2 07/03/18 1236 99 %     Weight 07/03/18 1240 114 lb 13.8 oz (52.1 kg)     Height 07/03/18 1240 _0  (1.676 m)     Head Circumference --      Peak Flow --      Pain Score 07/03/18 1239 10   Constitutional: Alert and oriented.  Eyes: Conjunctivae are normal.  ENT      Head: Normocephalic and atraumatic.      Nose: No congestion/rhinnorhea.      Mouth/Throat: Mucous membranes are moist.      Neck: No stridor. Hematological/Lymphatic/Immunilogical: No cervical lymphadenopathy. Cardiovascular: Normal rate, regular rhythm.  No murmurs, rubs, or gallops. Respiratory: Normal respiratory effort without tachypnea nor retractions. Breath sounds are clear and equal bilaterally. No wheezes/rales/rhonchi. Gastrointestinal: Soft and non tender. No rebound. No guarding.  Genitourinary: Deferred Musculoskeletal: Normal range of motion in all extremities. Bilateral feet in boots.  Neurologic:  Normal speech and language. No gross focal neurologic deficits are appreciated.  Skin:  Skin is warm, dry and intact. No rash noted. Psychiatric: Mood and affect are normal. Speech and behavior are normal. Patient exhibits appropriate insight and judgment.  ____________________________________________    LABS (pertinent positives/negatives)  CBC wbc 11.1, hgb 10.5, plt 358 CMP na 133, k 4.3, glu 310, cr 1.55, anion gap 11 VBG pH 7.43  ____________________________________________   EKG  None  ____________________________________________    RADIOLOGY  Left foot Concern for osteomyelitis at base of left 5th metatarsal stump  Right foot Transverse fracture of distal shaft of third metatarsal  ____________________________________________   PROCEDURES  Procedures  ____________________________________________   INITIAL IMPRESSION / ASSESSMENT AND PLAN / ED COURSE  Pertinent labs & imaging results that were available during my care of the patient were reviewed by me and considered in  my medical decision making (see chart for details).   Patient presented to the emergency department today because of concerns for nausea vomiting as well as diarrhea and bilateral foot pain.  Terms of the nausea vomiting at this time I think gastroenteritis likely.  In terms of the foot pain bilateral x-rays were taken given recent history of procedures and admission.  Did have concerns for acute fracture of the right foot and possible osteomyelitis of the left fifth metatarsal stump.  I discussed both these findings with Dr. Elvina Mattes.  Given the recent procedure he thinks that the osteomyelitis that is probably more likely post surgical.  Also thinks that he might of had similar fracture type pattern previously.  He does have follow-up already scheduled with Dr. Elvina Mattes tomorrow.  Patient is already on antibiotics.  Will plan on discharging to follow-up with Dr. Elvina Mattes tomorrow ____________________________________________   FINAL CLINICAL IMPRESSION(S) / ED DIAGNOSES  Final diagnoses:  Pain in both lower extremities  Nausea and vomiting, intractability of vomiting not specified, unspecified vomiting type     Note: This dictation was prepared with Dragon dictation. Any transcriptional errors that result  from this process are unintentional     Nance Pear, MD 07/03/18 [phone]

## 2018-07-07 ENCOUNTER — Telehealth: Payer: Self-pay | Admitting: Licensed Clinical Social Worker

## 2018-07-07 NOTE — Telephone Encounter (Signed)
Due to increase creatinine patient will be switched from Vancomycin to Daptomycin. Baseline CK will be drawn by [facility] nursing.

## 2018-07-09 ENCOUNTER — Observation Stay
Admission: EM | Admit: 2018-07-09 | Discharge: 2018-07-11 | Disposition: A | Discharge status: Home Health | Payer: Medicaid Other | Attending: Internal Medicine | Admitting: Internal Medicine

## 2018-07-09 ENCOUNTER — Emergency Department: Payer: Medicaid Other

## 2018-07-09 ENCOUNTER — Other Ambulatory Visit: Payer: Self-pay

## 2018-07-09 ENCOUNTER — Encounter: Payer: Self-pay | Admitting: Emergency Medicine

## 2018-07-09 DIAGNOSIS — Z794 Long term (current) use of insulin: Secondary | ICD-10-CM | POA: Insufficient documentation

## 2018-07-09 DIAGNOSIS — Z8041 Family history of malignant neoplasm of ovary: Secondary | ICD-10-CM | POA: Insufficient documentation

## 2018-07-09 DIAGNOSIS — I82611 Acute embolism and thrombosis of superficial veins of right upper extremity: Secondary | ICD-10-CM | POA: Insufficient documentation

## 2018-07-09 DIAGNOSIS — Z23 Encounter for immunization: Secondary | ICD-10-CM | POA: Insufficient documentation

## 2018-07-09 DIAGNOSIS — E1043 Type 1 diabetes mellitus with diabetic autonomic (poly)neuropathy: Secondary | ICD-10-CM | POA: Insufficient documentation

## 2018-07-09 DIAGNOSIS — Z1611 Resistance to penicillins: Secondary | ICD-10-CM | POA: Diagnosis not present

## 2018-07-09 DIAGNOSIS — F329 Major depressive disorder, single episode, unspecified: Secondary | ICD-10-CM | POA: Insufficient documentation

## 2018-07-09 DIAGNOSIS — E43 Unspecified severe protein-calorie malnutrition: Secondary | ICD-10-CM | POA: Insufficient documentation

## 2018-07-09 DIAGNOSIS — Z91018 Allergy to other foods: Secondary | ICD-10-CM | POA: Insufficient documentation

## 2018-07-09 DIAGNOSIS — K3184 Gastroparesis: Secondary | ICD-10-CM | POA: Diagnosis not present

## 2018-07-09 DIAGNOSIS — Z833 Family history of diabetes mellitus: Secondary | ICD-10-CM | POA: Insufficient documentation

## 2018-07-09 DIAGNOSIS — Z79899 Other long term (current) drug therapy: Secondary | ICD-10-CM | POA: Insufficient documentation

## 2018-07-09 DIAGNOSIS — Z882 Allergy status to sulfonamides status: Secondary | ICD-10-CM | POA: Insufficient documentation

## 2018-07-09 DIAGNOSIS — B9562 Methicillin resistant Staphylococcus aureus infection as the cause of diseases classified elsewhere: Secondary | ICD-10-CM | POA: Diagnosis not present

## 2018-07-09 DIAGNOSIS — I129 Hypertensive chronic kidney disease with stage 1 through stage 4 chronic kidney disease, or unspecified chronic kidney disease: Secondary | ICD-10-CM | POA: Insufficient documentation

## 2018-07-09 DIAGNOSIS — D638 Anemia in other chronic diseases classified elsewhere: Secondary | ICD-10-CM | POA: Diagnosis not present

## 2018-07-09 DIAGNOSIS — B961 Klebsiella pneumoniae [K. pneumoniae] as the cause of diseases classified elsewhere: Secondary | ICD-10-CM | POA: Diagnosis not present

## 2018-07-09 DIAGNOSIS — E1022 Type 1 diabetes mellitus with diabetic chronic kidney disease: Secondary | ICD-10-CM | POA: Insufficient documentation

## 2018-07-09 DIAGNOSIS — X58XXXA Exposure to other specified factors, initial encounter: Secondary | ICD-10-CM | POA: Diagnosis not present

## 2018-07-09 DIAGNOSIS — M869 Osteomyelitis, unspecified: Secondary | ICD-10-CM | POA: Diagnosis not present

## 2018-07-09 DIAGNOSIS — F419 Anxiety disorder, unspecified: Secondary | ICD-10-CM | POA: Insufficient documentation

## 2018-07-09 DIAGNOSIS — G43909 Migraine, unspecified, not intractable, without status migrainosus: Secondary | ICD-10-CM | POA: Diagnosis not present

## 2018-07-09 DIAGNOSIS — T80219A Unspecified infection due to central venous catheter, initial encounter: Secondary | ICD-10-CM | POA: Diagnosis present

## 2018-07-09 DIAGNOSIS — Z681 Body mass index (BMI) 19 or less, adult: Secondary | ICD-10-CM | POA: Insufficient documentation

## 2018-07-09 DIAGNOSIS — K219 Gastro-esophageal reflux disease without esophagitis: Secondary | ICD-10-CM | POA: Insufficient documentation

## 2018-07-09 DIAGNOSIS — Z91013 Allergy to seafood: Secondary | ICD-10-CM | POA: Insufficient documentation

## 2018-07-09 DIAGNOSIS — T85735A Infection and inflammatory reaction due to cranial or spinal infusion catheter, initial encounter: Principal | ICD-10-CM | POA: Insufficient documentation

## 2018-07-09 DIAGNOSIS — R739 Hyperglycemia, unspecified: Secondary | ICD-10-CM

## 2018-07-09 DIAGNOSIS — N183 Chronic kidney disease, stage 3 (moderate): Secondary | ICD-10-CM | POA: Insufficient documentation

## 2018-07-09 DIAGNOSIS — R52 Pain, unspecified: Secondary | ICD-10-CM

## 2018-07-09 DIAGNOSIS — E7800 Pure hypercholesterolemia, unspecified: Secondary | ICD-10-CM | POA: Diagnosis not present

## 2018-07-09 DIAGNOSIS — T82598A Other mechanical complication of other cardiac and vascular devices and implants, initial encounter: Secondary | ICD-10-CM | POA: Diagnosis present

## 2018-07-09 DIAGNOSIS — K589 Irritable bowel syndrome without diarrhea: Secondary | ICD-10-CM | POA: Diagnosis not present

## 2018-07-09 DIAGNOSIS — Z881 Allergy status to other antibiotic agents status: Secondary | ICD-10-CM | POA: Insufficient documentation

## 2018-07-09 DIAGNOSIS — Z89422 Acquired absence of other left toe(s): Secondary | ICD-10-CM | POA: Insufficient documentation

## 2018-07-09 DIAGNOSIS — Z9102 Food additives allergy status: Secondary | ICD-10-CM | POA: Insufficient documentation

## 2018-07-09 DIAGNOSIS — Z89421 Acquired absence of other right toe(s): Secondary | ICD-10-CM | POA: Insufficient documentation

## 2018-07-09 DIAGNOSIS — E1065 Type 1 diabetes mellitus with hyperglycemia: Secondary | ICD-10-CM | POA: Insufficient documentation

## 2018-07-09 LAB — URINALYSIS, COMPLETE (UACMP) WITH MICROSCOPIC
Bilirubin Urine: NEGATIVE
Glucose, UA: >=500 mg/dL — AB
Ketones, ur: NEGATIVE mg/dL
Leukocytes, UA: NEGATIVE
Nitrite: NEGATIVE
PROTEIN: 100 mg/dL — AB
Specific Gravity, Urine: 1.013 (ref 1.005–1.030)
Squamous Epithelial / HPF: NONE SEEN (ref 0–5)
pH: 6 (ref 5.0–8.0)

## 2018-07-09 LAB — CBC WITH DIFFERENTIAL/PLATELET
Abs Immature Granulocytes: 0.03 10*3/uL (ref 0.00–0.07)
Basophils Absolute: 0 10*3/uL (ref 0.0–0.1)
Basophils Relative: 0 %
Eosinophils Absolute: 0.5 10*3/uL (ref 0.0–0.5)
Eosinophils Relative: 7 %
HCT: 30 % — ABNORMAL LOW (ref 39.0–52.0)
Hemoglobin: 9.8 g/dL — ABNORMAL LOW (ref 13.0–17.0)
IMMATURE GRANULOCYTES: 0 %
Lymphocytes Relative: 23 %
Lymphs Abs: 1.7 10*3/uL (ref 0.7–4.0)
MCH: 26.9 pg (ref 26.0–34.0)
MCHC: 32.7 g/dL (ref 30.0–36.0)
MCV: 82.4 fL (ref 80.0–100.0)
Monocytes Absolute: 0.8 10*3/uL (ref 0.1–1.0)
Monocytes Relative: 11 %
Neutro Abs: 4.3 10*3/uL (ref 1.7–7.7)
Neutrophils Relative %: 59 %
Platelets: 305 10*3/uL (ref 150–400)
RBC: 3.64 MIL/uL — ABNORMAL LOW (ref 4.22–5.81)
RDW: 14.5 % (ref 11.5–15.5)
WBC: 7.3 10*3/uL (ref 4.0–10.5)
nRBC: 0 % (ref 0.0–0.2)

## 2018-07-09 LAB — BASIC METABOLIC PANEL
Anion gap: 10 (ref 5–15)
BUN: 43 mg/dL — ABNORMAL HIGH (ref 6–20)
CALCIUM: 8.4 mg/dL — AB (ref 8.9–10.3)
CO2: 21 mmol/L — ABNORMAL LOW (ref 22–32)
Chloride: 97 mmol/L — ABNORMAL LOW (ref 98–111)
Creatinine, Ser: 1.95 mg/dL — ABNORMAL HIGH (ref 0.61–1.24)
GFR calc Af Amer: 52 mL/min — ABNORMAL LOW (ref >60)
GFR calc non Af Amer: 45 mL/min — ABNORMAL LOW (ref >60)
Glucose, Bld: 687 mg/dL (ref 70–99)
Potassium: 4.7 mmol/L (ref 3.5–5.1)
Sodium: 128 mmol/L — ABNORMAL LOW (ref 135–145)

## 2018-07-09 LAB — VANCOMYCIN, RANDOM: Vancomycin Rm: 4

## 2018-07-09 LAB — GLUCOSE, CAPILLARY: Glucose-Capillary: 528 mg/dL (ref 70–99)

## 2018-07-09 MED ORDER — VANCOMYCIN HCL IN DEXTROSE 1-5 GM/200ML-% IV SOLN
1000.0000 mg | Freq: Once | INTRAVENOUS | Status: AC
  Administered 2018-07-09: 1000 mg via INTRAVENOUS
  Filled 2018-07-09 (×2): qty 200

## 2018-07-09 MED ORDER — MORPHINE SULFATE (PF) 4 MG/ML IV SOLN
4.0000 mg | Freq: Once | INTRAVENOUS | Status: AC
  Administered 2018-07-09: 4 mg via INTRAVENOUS
  Filled 2018-07-09: qty 1

## 2018-07-09 MED ORDER — INSULIN ASPART 100 UNIT/ML ~~LOC~~ SOLN
6.0000 [IU] | Freq: Once | SUBCUTANEOUS | Status: AC
  Administered 2018-07-09: 6 [IU] via INTRAVENOUS

## 2018-07-09 MED ORDER — MORPHINE SULFATE (PF) 2 MG/ML IV SOLN
2.0000 mg | Freq: Once | INTRAVENOUS | Status: AC
  Administered 2018-07-09: 2 mg via INTRAVENOUS
  Filled 2018-07-09: qty 1

## 2018-07-09 MED ORDER — SODIUM CHLORIDE 0.9 % IV BOLUS
1000.0000 mL | Freq: Once | INTRAVENOUS | Status: AC
  Administered 2018-07-09: 1000 mL via INTRAVENOUS

## 2018-07-09 MED ORDER — PROMETHAZINE HCL 25 MG PO TABS
25.0000 mg | ORAL_TABLET | Freq: Once | ORAL | Status: AC
  Administered 2018-07-09: 25 mg via ORAL
  Filled 2018-07-09: qty 1

## 2018-07-09 NOTE — ED Provider Notes (Signed)
[facility] Emergency Department Provider Note ____________________________________________  First MD Initiated Contact with Patient 07/09/18 2031     (approximate)  I have reviewed the triage vital signs and the nursing notes.  HISTORY  Chief Complaint Vascular Access Problem  HPI Shawn [name] is a 33 y.o. male for evaluation due to redness in his right upper arm and also inability to flush his PICC line today  Patient reports he noticed yesterday is having some pain around the area where his PICC line goes in.  That today he noticed redness in the area when his nurses dressing change and also they could not flush it despite changing out some of the lines.  Denies fevers or chills.  He is on antibiotics to infection in both feet.  Also reports diabetes and his blood sugars have been elevated.  No numbness tingling or weakness in the arm.  No chest pain or shortness of breath.  Ports has had the PICC line since he left the hospital, and had been working well until today which case he could not flush it.  Also noticed some redness of the skin around it   Past Medical History:  Diagnosis Date  . CKD (chronic kidney disease)   . Diabetes mellitus without complication ([place])   . GERD (gastroesophageal reflux disease)   . IBS (irritable bowel syndrome)   . Osteomyelitis [facility])     Patient Active Problem List   Diagnosis Date Noted  . Hyperglycemic hyperosmolar nonketotic coma ([place]) 05/28/2018  . Acute renal failure (ARF) ([place]) 04/27/2018  . Left foot infection 04/05/2018  . Foot infection 02/06/2018  . Osteomyelitis ([place]) 01/10/2018  . Cellulitis 12/29/2017  . Sepsis ([place]) 12/18/2017  . Moderate recurrent major depression ([place]) 11/10/2017  . Type 2 diabetes mellitus with hyperosmolar nonketotic hyperglycemia ([place]) 11/09/2017  . History of migraine 07/15/2017  . IBS (irritable bowel syndrome) 07/15/2017  . Protein-calorie malnutrition, severe 07/14/2017   . Abdominal pain 07/13/2017  . GERD (gastroesophageal reflux disease) 12/30/2009  . Diabetes ([place]) 11/25/2009  . MDD (major depressive disorder) 11/25/2009  . Hypercholesterolemia 03/07/2008    Past Surgical History:  Procedure Laterality Date  . ABDOMINAL AORTOGRAM W/LOWER EXTREMITY Right 12/23/2017   Procedure: ABDOMINAL AORTOGRAM W/LOWER EXTREMITY;  Surgeon: Katha Cabal, MD;  Location: [facility];  Service: Cardiovascular;  Laterality: Right;  . ACHILLES TENDON SURGERY Bilateral 06/16/2018   Procedure: ACHILLES LENGTHENING/KIDNER;  Surgeon: Albertine Patricia, DPM;  Location: [facility];  Service: Podiatry;  Laterality: Bilateral;  . AMPUTATION Right 12/24/2017   Procedure: AMPUTATION RAY;  Surgeon: Sharlotte Alamo, DPM;  Location: [facility];  Service: Podiatry;  Laterality: Right;  . AMPUTATION Left 04/06/2018   Procedure: AMPUTATION RAY;  Surgeon: Sharlotte Alamo, DPM;  Location: [facility];  Service: Podiatry;  Laterality: Left;  . AMPUTATION Left 04/09/2018   Procedure: AMPUTATION RAY;  Surgeon: Sharlotte Alamo, DPM;  Location: [facility];  Service: Podiatry;  Laterality: Left;  . APPLICATION OF WOUND VAC Right 12/24/2017   Procedure: APPLICATION OF WOUND VAC;  Surgeon: Sharlotte Alamo, DPM;  Location: [facility];  Service: Podiatry;  Laterality: Right;  . APPLICATION OF WOUND VAC Right 01/01/2018   Procedure: APPLICATION OF WOUND VAC;  Surgeon: Albertine Patricia, DPM;  Location: [facility];  Service: Podiatry;  Laterality: Right;  . BONE EXCISION Bilateral 06/16/2018   Procedure: BONE EXCISION AND SOFT TISSUE;  Surgeon: Albertine Patricia, DPM;  Location: [facility];  Service: Podiatry;  Laterality: Bilateral;  . IRRIGATION AND  DEBRIDEMENT FOOT Right 12/20/2017   Procedure: IRRIGATION AND DEBRIDEMENT FOOT;  Surgeon: Sharlotte Alamo, DPM;  Location: [facility];  Service: Podiatry;  Laterality: Right;  . IRRIGATION AND DEBRIDEMENT FOOT Right 12/24/2017   Procedure: IRRIGATION AND DEBRIDEMENT FOOT;  Surgeon:  Sharlotte Alamo, DPM;  Location: [facility];  Service: Podiatry;  Laterality: Right;  . IRRIGATION AND DEBRIDEMENT FOOT Right 01/01/2018   Procedure: IRRIGATION AND DEBRIDEMENT FOOT-SKIN,SOFT TISSUE AND BONE;  Surgeon: Albertine Patricia, DPM;  Location: [facility];  Service: Podiatry;  Laterality: Right;  . IRRIGATION AND DEBRIDEMENT FOOT Left 04/06/2018   Procedure: IRRIGATION AND DEBRIDEMENT FOOT;  Surgeon: Sharlotte Alamo, DPM;  Location: [facility];  Service: Podiatry;  Laterality: Left;  . LOWER EXTREMITY ANGIOGRAPHY Left 04/06/2018   Procedure: Lower Extremity Angiography;  Surgeon: Algernon Huxley, MD;  Location: [place] [facility];  Service: Cardiovascular;  Laterality: Left;    Prior to Admission medications   Medication Sig Start Date End Date Taking? Authorizing Provider  ALPRAZolam (XANAX) 0.25 MG tablet Take 1 tablet (0.25 mg total) by mouth 3 (three) times daily as needed for anxiety. 06/20/18   Gladstone Lighter, MD  amLODipine (NORVASC) 10 MG tablet Take 1 tablet (10 mg total) by mouth daily. 06/21/18   Gladstone Lighter, MD  diphenoxylate-atropine (LOMOTIL) 2.5-0.025 MG tablet Take 1 tablet by mouth 4 (four) times daily as needed for diarrhea or loose stools. 04/11/18 04/11/19  Henreitta Leber, MD  ferrous sulfate 325 (65 FE) MG tablet Take 1 tablet (325 mg total) by mouth 2 (two) times daily with a meal. 01/03/18   Sainani, Belia Heman, MD  gabapentin (NEURONTIN) 300 MG capsule Take 1 capsule (300 mg total) by mouth 3 (three) times daily. 07/03/18 07/03/19  Nance Pear, MD  hydrALAZINE (APRESOLINE) 25 MG tablet Take 1 tablet (25 mg total) by mouth every 8 (eight) hours. 06/20/18   Gladstone Lighter, MD  insulin glargine (LANTUS) 100 UNIT/ML injection Inject 0.25 mLs (25 Units total) into the skin at bedtime. 06/20/18   Gladstone Lighter, MD  Insulin Syringes, Disposable, U-100 0.3 ML MISC 1 Syringe by Does not apply route 4 (four) times daily -  with meals and at bedtime. 12/27/17   Wieting,  Richard, MD  NOVOLIN R RELION 100 UNIT/ML injection Inject 0-0.1 mLs (0-10 Units total) into the skin 3 (three) times daily as needed for high blood sugar (sliding scale). 05/02/18 07/01/18  Loletha Grayer, MD  oxyCODONE-acetaminophen (PERCOCET) 10-325 MG tablet Take 1 tablet by mouth every 6 (six) hours as needed for pain. 06/20/18 06/20/19  Gladstone Lighter, MD  protein supplement shake (PREMIER PROTEIN) LIQD Take 325 mLs (11 oz total) by mouth 2 (two) times daily between meals. 06/20/18 07/20/18  Gladstone Lighter, MD  vancomycin IVPB Inject 1,000 mg into the vein daily. Indication: MRSA bacteremia, Diabetic foot infection and osteomyelitis Last Day of Therapy: 07/27/2018 Labs - Sunday/Monday:  CBC/D, BMP, and vancomycin trough. Labs - Thursday:  BMP and vancomycin trough Labs - Every other week:  ESR and CRP 06/20/18 07/27/18  Gladstone Lighter, MD  vitamin C (VITAMIN C) 250 MG tablet Take 1 tablet (250 mg total) by mouth 2 (two) times daily. 06/20/18   Gladstone Lighter, MD    Allergies Banana; Keflex [cephalexin]; Onion; Sulfa antibiotics; Grapeseed extract [nutritional supplements]; and Shellfish allergy  Family History  Problem Relation Age of Onset  . Diabetes Mother   . Ovarian cancer Mother   . Healthy Father     Social History Social History  Tobacco Use  . Smoking status: Never Smoker  . Smokeless tobacco: Never Used  Substance Use Topics  . Alcohol use: No    Frequency: Never  . Drug use: Yes    Types: Marijuana, PCP    Review of Systems Constitutional: No fever/chills Eyes: No visual changes. ENT: No sore throat. Cardiovascular: Denies chest pain. Respiratory: Denies shortness of breath. Gastrointestinal: No abdominal pain.   Genitourinary: Negative for dysuria. Musculoskeletal: Negative for back pain.  Some slight discomfort or sharp feelings precisely where his PICC line goes underneath the skin in the right upper arm.  Chronic pain in both feet  reports he takes oxycodone for this due to infection. Skin: Negative for rash except for around the area of his PICC line. Neurological: Negative for headaches, areas of focal weakness or numbness.    ____________________________________________   PHYSICAL EXAM:  VITAL SIGNS: ED Triage Vitals [07/09/18 1758]  Enc Vitals Group     BP (!) 135/95     Pulse Rate 99     Resp 16     Temp 98 F (36.7 C)     Temp Source Oral     SpO2 99 %     Weight 114 lb 13.8 oz (52.1 kg)     Height      Head Circumference      Peak Flow      Pain Score 0     Pain Loc      Pain Edu?      Excl. in [place]?     Constitutional: Alert and oriented.  Chronically ill-appearing, very pleasant and in no acute distress. Eyes: Conjunctivae are normal. Head: Atraumatic. Nose: No congestion/rhinnorhea. Mouth/Throat: Mucous membranes are moist. Neck: No stridor.  Cardiovascular: Normal rate, regular rhythm. Grossly normal heart sounds.  Good peripheral circulation. Respiratory: Normal respiratory effort.  No retractions. Lungs CTAB. Gastrointestinal: Soft and nontender. No distention. Musculoskeletal: No lower extremity tenderness nor edema.  Right upper extremity median ulnar radial nerve intact.  Strong radial pulse.  PICC line in the right upper arm, unfortunately has some surrounding redness that could be contact dermatitis-like picture or phlebitis as it seems to be somewhat rectangular and follows the shape of his bandage line, however at the skin site of the PICC line insertion there is some small amount of purulence emerging.  Patient reports that is new, also the area is tender he feels it is red and slightly painful. (see media upload) Neurologic:  Normal speech and language. No gross focal neurologic deficits are appreciated.  Skin:  Skin is warm, dry and intact. No rash noted step as above. Psychiatric: Mood and affect are normal. Speech and behavior are  normal.  ____________________________________________   LABS (all labs ordered are listed, but only abnormal results are displayed)  Labs Reviewed  CBC WITH DIFFERENTIAL/PLATELET - Abnormal; Notable for the following components:      Result Value   RBC 3.64 (*)    Hemoglobin 9.8 (*)    HCT 30.0 (*)    All other components within normal limits  BASIC METABOLIC PANEL - Abnormal; Notable for the following components:   Sodium 128 (*)    Chloride 97 (*)    CO2 21 (*)    Glucose, Bld 687 (*)    BUN 43 (*)    Creatinine, Ser 1.95 (*)    Calcium 8.4 (*)    GFR calc non Af Amer 45 (*)    GFR calc Af Amer 52 (*)  All other components within normal limits  GLUCOSE, CAPILLARY - Abnormal; Notable for the following components:   Glucose-Capillary 528 (*)    All other components within normal limits  CULTURE, BLOOD (ROUTINE X 2)  CULTURE, BLOOD (ROUTINE X 2)  AEROBIC CULTURE (SUPERFICIAL SPECIMEN)  CATH TIP CULTURE  VANCOMYCIN, RANDOM  URINALYSIS, COMPLETE (UACMP) WITH MICROSCOPIC  CBG MONITORING, ED  CBG MONITORING, ED  CBG MONITORING, ED  CBG MONITORING, ED  CBG MONITORING, ED   ____________________________________________  EKG   ____________________________________________  RADIOLOGY  Right upper extremity ultrasound, reviewed imaging results ____________________________________________   PROCEDURES  Procedure(s) performed: None  Procedures  Critical Care performed: No  ____________________________________________   INITIAL IMPRESSION / ASSESSMENT AND PLAN / ED COURSE  Pertinent labs & imaging results that were available during my care of the patient were reviewed by me and considered in my medical decision making (see chart for details).   Patient presents for difficulty flushing his PICC line, now with redness pain and discomfort in the area and some expressive purulence coming from the PICC line insertion site.  I discussed with vascular surgery (Dr.  Shelia Media) and he advises that he would have the PICC line discontinued.  PICC line removed, sent tip for culture.  Also obtain a skin culture from the purulence that was expressed.  Unfortunately, given the patient's need for IV antibiotic, hyperglycemia, and infected PICC line I have a certainly elevated concern for possible bacteremia.  Will reinitiate vancomycin, admit to hospital for further evaluation under the internal medicine service.  Patient is comfortable with this plan.  Notable hyperglycemia, normal anion gap just minimally decreased bicarbonate.  No evidence of acute DKA.  Will provide fluids and insulin bolus, check glucose every hour for improvement.  ----------------------------------------- 11:42 PM on 07/09/2018 -----------------------------------------  Admitted to hospitalist service, Dr. Jerelyn Charles evaluating patient      ____________________________________________   FINAL CLINICAL IMPRESSION(S) / ED DIAGNOSES  Final diagnoses:  Pain  PICC line infection, initial encounter  Hyperglycemia        Note:  This document was prepared using Dragon voice recognition software and may include unintentional dictation errors       Delman Kitten, MD 07/09/18 2343

## 2018-07-09 NOTE — H&P (Signed)
[facility] NAME: Quindarius Cabello    MR#:  [id number]  DATE OF BIRTH:  12-Aug-1985  DATE OF ADMISSION:  07/09/2018  PRIMARY CARE PHYSICIAN: Valera Castle, MD   REQUESTING/REFERRING PHYSICIAN:   CHIEF COMPLAINT:   Chief Complaint  Patient presents with  . Vascular Access Problem    HISTORY OF PRESENT ILLNESS: Jermany Rimel  is a 33 y.o. male with a known history per below, recently discharged from our hospital last month for bilateral lower extremity diabetic foot infection/osteomyelitis-on chronic IV vancomycin through July 27, 2018 via PICC line, presents emergency room for dysfunctional PICC line, per ED attending-PICC line removed due to dysfunction/noted purulent drainage per ED attending, sodium 128, chloride 97, glucose 687, hospitalist asked to admit for further evaluation/care, patient evaluated in the emergency room, no apparent distress, resting comfortably in bed, patient is now been admitted for dysfunctional and ? infected right PICC line-status post removal in the emergency room.  PAST MEDICAL HISTORY:   Past Medical History:  Diagnosis Date  . CKD (chronic kidney disease)   . Diabetes mellitus without complication ([place])   . GERD (gastroesophageal reflux disease)   . IBS (irritable bowel syndrome)   . Osteomyelitis ([place])     PAST SURGICAL HISTORY:  Past Surgical History:  Procedure Laterality Date  . ABDOMINAL AORTOGRAM W/LOWER EXTREMITY Right 12/23/2017   Procedure: ABDOMINAL AORTOGRAM W/LOWER EXTREMITY;  Surgeon: Katha Cabal, MD;  Location: [facility];  Service: Cardiovascular;  Laterality: Right;  . ACHILLES TENDON SURGERY Bilateral 06/16/2018   Procedure: ACHILLES LENGTHENING/KIDNER;  Surgeon: Albertine Patricia, DPM;  Location: [facility];  Service: Podiatry;  Laterality: Bilateral;  . AMPUTATION Right 12/24/2017   Procedure: AMPUTATION RAY;  Surgeon: Sharlotte Alamo, DPM;  Location: [facility];   Service: Podiatry;  Laterality: Right;  . AMPUTATION Left 04/06/2018   Procedure: AMPUTATION RAY;  Surgeon: Sharlotte Alamo, DPM;  Location: [facility];  Service: Podiatry;  Laterality: Left;  . AMPUTATION Left 04/09/2018   Procedure: AMPUTATION RAY;  Surgeon: Sharlotte Alamo, DPM;  Location: [facility];  Service: Podiatry;  Laterality: Left;  . APPLICATION OF WOUND VAC Right 12/24/2017   Procedure: APPLICATION OF WOUND VAC;  Surgeon: Sharlotte Alamo, DPM;  Location: [facility];  Service: Podiatry;  Laterality: Right;  . APPLICATION OF WOUND VAC Right 01/01/2018   Procedure: APPLICATION OF WOUND VAC;  Surgeon: Albertine Patricia, DPM;  Location: [facility];  Service: Podiatry;  Laterality: Right;  . BONE EXCISION Bilateral 06/16/2018   Procedure: BONE EXCISION AND SOFT TISSUE;  Surgeon: Albertine Patricia, DPM;  Location: [facility];  Service: Podiatry;  Laterality: Bilateral;  . IRRIGATION AND DEBRIDEMENT FOOT Right 12/20/2017   Procedure: IRRIGATION AND DEBRIDEMENT FOOT;  Surgeon: Sharlotte Alamo, DPM;  Location: [facility];  Service: Podiatry;  Laterality: Right;  . IRRIGATION AND DEBRIDEMENT FOOT Right 12/24/2017   Procedure: IRRIGATION AND DEBRIDEMENT FOOT;  Surgeon: Sharlotte Alamo, DPM;  Location: [facility];  Service: Podiatry;  Laterality: Right;  . IRRIGATION AND DEBRIDEMENT FOOT Right 01/01/2018   Procedure: IRRIGATION AND DEBRIDEMENT FOOT-SKIN,SOFT TISSUE AND BONE;  Surgeon: Albertine Patricia, DPM;  Location: [facility];  Service: Podiatry;  Laterality: Right;  . IRRIGATION AND DEBRIDEMENT FOOT Left 04/06/2018   Procedure: IRRIGATION AND DEBRIDEMENT FOOT;  Surgeon: Sharlotte Alamo, DPM;  Location: [facility];  Service: Podiatry;  Laterality: Left;  . LOWER EXTREMITY ANGIOGRAPHY Left 04/06/2018   Procedure: Lower Extremity Angiography;  Surgeon: Algernon Huxley, MD;  Location:  [facility];  Service: Cardiovascular;  Laterality: Left;    SOCIAL HISTORY:  Social History   Tobacco Use  . Smoking status: Never Smoker  . Smokeless  tobacco: Never Used  Substance Use Topics  . Alcohol use: No    Frequency: Never    FAMILY HISTORY:  Family History  Problem Relation Age of Onset  . Diabetes Mother   . Ovarian cancer Mother   . Healthy Father     DRUG ALLERGIES:  Allergies  Allergen Reactions  . Banana Hives, Nausea And Vomiting and Rash  . Keflex [Cephalexin] Rash    No swelling- also taken penicillin without any issue.  . Onion Hives, Nausea And Vomiting and Rash  . Sulfa Antibiotics Anaphylaxis  . Grapeseed Extract [Nutritional Supplements] Itching  . Shellfish Allergy Hives    "ALL SEAFOOD"    REVIEW OF SYSTEMS:   CONSTITUTIONAL: No fever, fatigue or weakness.  EYES: No blurred or double vision.  EARS, NOSE, AND THROAT: No tinnitus or ear pain.  RESPIRATORY: No cough, shortness of breath, wheezing or hemoptysis.  CARDIOVASCULAR: No chest pain, orthopnea, edema.  GASTROINTESTINAL: No nausea, vomiting, diarrhea or abdominal pain.  GENITOURINARY: No dysuria, hematuria.  ENDOCRINE: No polyuria, nocturia,  HEMATOLOGY: No anemia, easy bruising or bleeding SKIN: No rash or lesion.  Dysfunctional right arm PICC line MUSCULOSKELETAL: No joint pain or arthritis.   NEUROLOGIC: No tingling, numbness, weakness.  PSYCHIATRY: No anxiety or depression.   MEDICATIONS AT HOME:  Prior to Admission medications   Medication Sig Start Date End Date Taking? Authorizing Provider  ALPRAZolam (XANAX) 0.25 MG tablet Take 1 tablet (0.25 mg total) by mouth 3 (three) times daily as needed for anxiety. 06/20/18   Gladstone Lighter, MD  amLODipine (NORVASC) 10 MG tablet Take 1 tablet (10 mg total) by mouth daily. 06/21/18   Gladstone Lighter, MD  diphenoxylate-atropine (LOMOTIL) 2.5-0.025 MG tablet Take 1 tablet by mouth 4 (four) times daily as needed for diarrhea or loose stools. 04/11/18 04/11/19  Henreitta Leber, MD  ferrous sulfate 325 (65 FE) MG tablet Take 1 tablet (325 mg total) by mouth 2 (two) times daily with a  meal. 01/03/18   Sainani, Belia Heman, MD  gabapentin (NEURONTIN) 300 MG capsule Take 1 capsule (300 mg total) by mouth 3 (three) times daily. 07/03/18 07/03/19  Nance Pear, MD  hydrALAZINE (APRESOLINE) 25 MG tablet Take 1 tablet (25 mg total) by mouth every 8 (eight) hours. 06/20/18   Gladstone Lighter, MD  insulin glargine (LANTUS) 100 UNIT/ML injection Inject 0.25 mLs (25 Units total) into the skin at bedtime. 06/20/18   Gladstone Lighter, MD  Insulin Syringes, Disposable, U-100 0.3 ML MISC 1 Syringe by Does not apply route 4 (four) times daily -  with meals and at bedtime. 12/27/17   Wieting, Richard, MD  NOVOLIN R RELION 100 UNIT/ML injection Inject 0-0.1 mLs (0-10 Units total) into the skin 3 (three) times daily as needed for high blood sugar (sliding scale). 05/02/18 07/01/18  Loletha Grayer, MD  oxyCODONE-acetaminophen (PERCOCET) 10-325 MG tablet Take 1 tablet by mouth every 6 (six) hours as needed for pain. 06/20/18 06/20/19  Gladstone Lighter, MD  protein supplement shake (PREMIER PROTEIN) LIQD Take 325 mLs (11 oz total) by mouth 2 (two) times daily between meals. 06/20/18 07/20/18  Gladstone Lighter, MD  vancomycin IVPB Inject 1,000 mg into the vein daily. Indication: MRSA bacteremia, Diabetic foot infection and osteomyelitis Last Day of Therapy: 07/27/2018 Labs - Sunday/Monday:  CBC/D, BMP, and  vancomycin trough. Labs - Thursday:  BMP and vancomycin trough Labs - Every other week:  ESR and CRP 06/20/18 07/27/18  Gladstone Lighter, MD  vitamin C (VITAMIN C) 250 MG tablet Take 1 tablet (250 mg total) by mouth 2 (two) times daily. 06/20/18   Gladstone Lighter, MD      PHYSICAL EXAMINATION:   VITAL SIGNS: Blood pressure (!) 135/95, pulse 99, temperature 98 F (36.7 C), temperature source Oral, resp. rate 16, weight 52.1 kg, SpO2 99 %.  GENERAL:  33 y.o.-year-old patient lying in the bed with no acute distress.  Frail-appearing EYES: Pupils equal, round, reactive to light and  accommodation. No scleral icterus. Extraocular muscles intact.  HEENT: Head atraumatic, normocephalic. Oropharynx and nasopharynx clear.  NECK:  Supple, no jugular venous distention. No thyroid enlargement, no tenderness.  LUNGS: Normal breath sounds bilaterally, no wheezing, rales,rhonchi or crepitation. No use of accessory muscles of respiration.  CARDIOVASCULAR: S1, S2 normal. No murmurs, rubs, or gallops.  ABDOMEN: Soft, nontender, nondistended. Bowel sounds present. No organomegaly or mass.  EXTREMITIES: No pedal edema, cyanosis, or clubbing.  NEUROLOGIC: Cranial nerves II through XII are intact. Muscle strength 5/5 in all extremities. Sensation intact. Gait not checked.  PSYCHIATRIC: The patient is alert and oriented x 3.  SKIN: No obvious rash, lesion, or ulcer.    LABORATORY PANEL:   CBC Recent Labs  Lab 07/03/18 1307 07/09/18 2103  WBC 11.1* 7.3  HGB 10.5* 9.8*  HCT 33.4* 30.0*  PLT 358 305  MCV 85.0 82.4  MCH 26.7 26.9  MCHC 31.4 32.7  RDW 14.5 14.5  LYMPHSABS  --  1.7  MONOABS  --  0.8  EOSABS  --  0.5  BASOSABS  --  0.0   ------------------------------------------------------------------------------------------------------------------  Chemistries  Recent Labs  Lab 07/03/18 1307 07/09/18 2103  NA 133* 128*  K 4.3 4.7  CL 100 97*  CO2 22 21*  GLUCOSE 310* 687*  BUN 29* 43*  CREATININE 1.55* 1.95*  CALCIUM 8.5* 8.4*  AST 20  --   ALT 25  --   ALKPHOS 123  --   BILITOT 0.6  --    ------------------------------------------------------------------------------------------------------------------ estimated creatinine clearance is 40.8 mL/min (A) (by C-G formula based on SCr of 1.95 mg/dL (H)). ------------------------------------------------------------------------------------------------------------------ No results for input(s): TSH, T4TOTAL, T3FREE, THYROIDAB in the last 72 hours.  Invalid input(s): FREET3   Coagulation profile No results for  input(s): INR, PROTIME in the last 168 hours. ------------------------------------------------------------------------------------------------------------------- No results for input(s): DDIMER in the last 72 hours. -------------------------------------------------------------------------------------------------------------------  Cardiac Enzymes No results for input(s): CKMB, TROPONINI, MYOGLOBIN in the last 168 hours.  Invalid input(s): CK ------------------------------------------------------------------------------------------------------------------ Invalid input(s): POCBNP  ---------------------------------------------------------------------------------------------------------------  Urinalysis    Component Value Date/Time   COLORURINE YELLOW (A) 06/14/2018 1945   APPEARANCEUR HAZY (A) 06/14/2018 1945   APPEARANCEUR Clear 10/06/2014 0758   LABSPEC 1.020 06/14/2018 1945   LABSPEC 1.031 10/06/2014 0758   PHURINE 5.0 06/14/2018 1945   GLUCOSEU >=500 (A) 06/14/2018 1945   GLUCOSEU >=500 10/06/2014 0758   HGBUR MODERATE (A) 06/14/2018 1945   BILIRUBINUR NEGATIVE 06/14/2018 1945   BILIRUBINUR Negative 10/06/2014 0758   KETONESUR 5 (A) 06/14/2018 1945   PROTEINUR >=300 (A) 06/14/2018 1945   UROBILINOGEN 0.2 03/29/2007 1506   NITRITE NEGATIVE 06/14/2018 1945   LEUKOCYTESUR NEGATIVE 06/14/2018 1945   LEUKOCYTESUR Negative 10/06/2014 0758     RADIOLOGY: US Venous Img Upper Uni Right  Result Date: 07/09/2018 CLINICAL DATA:  RIGHT upper extremity pain, had a PICC line  1 month ago removed today EXAM: RIGHT UPPER EXTREMITY VENOUS DOPPLER ULTRASOUND TECHNIQUE: Gray-scale sonography with graded compression, as well as color Doppler and duplex ultrasound were performed to evaluate the upper extremity deep venous system from the level of the subclavian vein and including the jugular, axillary, basilic, radial, ulnar and upper cephalic vein. Spectral Doppler was utilized to evaluate flow  at rest and with distal augmentation maneuvers. COMPARISON:  None FINDINGS: Contralateral Subclavian Vein: Respiratory phasicity is normal and symmetric with the symptomatic side. No evidence of thrombus. Normal compressibility. Internal Jugular Vein: No evidence of thrombus. Normal compressibility, respiratory phasicity and response to augmentation. Subclavian Vein: No evidence of thrombus. Normal compressibility, respiratory phasicity and response to augmentation. Axillary Vein: No evidence of thrombus. Normal compressibility, respiratory phasicity and response to augmentation. Cephalic Vein: No evidence of thrombus. Normal compressibility, respiratory phasicity and response to augmentation. Basilic Vein: Nonocclusive thrombus within RIGHT basilic vein at the site of prior PICC line placement. Vessel is incompletely compressible. Small amount of residual patent flow is seen surrounding the clot. Brachial Veins: No evidence of thrombus. Normal compressibility, respiratory phasicity and response to augmentation. Radial Veins: No evidence of thrombus. Normal compressibility, respiratory phasicity and response to augmentation. Ulnar Veins: No evidence of thrombus. Normal compressibility, respiratory phasicity and response to augmentation. Venous Reflux:  None visualized. Other Findings:  None visualized. IMPRESSION: Segment of nonocclusive thrombus within the mid RIGHT basilic vein at site of prior PICC line. Remaining deep venous system of the RIGHT upper extremity appears patent. Electronically Signed   By: Lavonia Dana M.D.   On: 07/09/2018 22:15    EKG: Orders placed or performed during the hospital encounter of 06/14/18  . EKG 12-Lead  . EKG 12-Lead  . ED EKG  . ED EKG  . EKG    IMPRESSION AND PLAN:  33 year old male with past medical history significant for type 1 diabetes mellitus, neuropathy, gastroparesis, bilateral MRSA diabetic foot infection/osteo-myelitis-on IV vancomycin through July 27, 2018, presenting with dysfunctional right arm PICC line concern for ?  Infection of PICC line  *Acute dysfunctional/?  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  Minimal weightbearing and okay to have bathroom privileges using a walker.  *Acute hyperglycemia with chronic diabetes mellitus, type I Suspect exacerbation by underlying infection  IV fluids for rehydration, continue home insulin regiment, high-dose sliding scale insulin with Accu-Cheks per routine   *Chronic depression and anxiety- Stable  Continue home regiment   *Anemia of chronic disease and iron deficiency Stable  Continue iron supplementation  *CKD III BL Cr 1.7, currently 1.9 Avoid nephrotoxic agents   *Hypertension Stable Continue home regiment   All the records are reviewed and case discussed with ED provider. Management plans discussed with the patient, family and they are in agreement.  CODE STATUS: Code Status History    Date Active Date Inactive Code Status Order ID Comments User Context   06/14/2018 1744 06/20/2018 2123 Full Code [id number]  Loletha Grayer, MD ED   05/28/2018  2409 05/28/2018 1926 Full Code [id number]  Arta Silence, MD ED   04/27/2018 1726 05/02/2018 1713 Full Code [id number]  Henreitta Leber, MD Inpatient   04/05/2018 1724 04/11/2018 2142 Full Code [id number]  Dustin Flock, MD Inpatient   02/06/2018 2022 02/07/2018 1512 Full  Code [id number]  Demetrios Loll, MD Inpatient   01/10/2018 2259 01/11/2018 1947 Full Code [id number]  Amelia Jo, MD Inpatient   12/30/2017 0019 01/03/2018 1457 Full Code [id number]  Harrie Foreman, MD Inpatient   12/20/2017 1541 12/27/2017 2147 Full Code [id number]  Sharlotte Alamo, [facility] Inpatient   11/10/2017 0049 11/11/2017 1632 Full Code [id number]  Amelia Jo, MD Inpatient   07/13/2017 2112 07/15/2017 1730 Full Code [id number]  Jules Husbands, MD ED       TOTAL TIME TAKING CARE OF THIS PATIENT: 40 minutes.    Avel Peace  M.D on 07/09/2018   Between 7am to 6pm - Pager - [phone]  After 6pm go to [email or web address] - password [email or web address] [facility]  Office  [phone]  CC: Primary care physician; Valera Castle, MD   Note: This dictation was prepared with Dragon dictation along with smaller phrase technology. Any transcriptional errors that result from this process are unintentional.

## 2018-07-09 NOTE — ED Triage Notes (Signed)
Pt to ED via POV, pt states that he was recently DC from hospital with PICC line. Pt states that today he noticed that he was not able to flush anything in his line. His Home health nurse came out and changed his dressing and tried to flush the line as well but was unsuccessful. Pt states that he was advised to come to the ED to have PICC line evaluated. Pt is in NAD at this time. Pt has no other complaints at this time.

## 2018-07-10 ENCOUNTER — Observation Stay: Payer: Self-pay

## 2018-07-10 ENCOUNTER — Other Ambulatory Visit: Payer: Self-pay

## 2018-07-10 DIAGNOSIS — Z881 Allergy status to other antibiotic agents status: Secondary | ICD-10-CM

## 2018-07-10 DIAGNOSIS — K3184 Gastroparesis: Secondary | ICD-10-CM

## 2018-07-10 DIAGNOSIS — Z89421 Acquired absence of other right toe(s): Secondary | ICD-10-CM

## 2018-07-10 DIAGNOSIS — T82598A Other mechanical complication of other cardiac and vascular devices and implants, initial encounter: Secondary | ICD-10-CM | POA: Diagnosis not present

## 2018-07-10 DIAGNOSIS — B9562 Methicillin resistant Staphylococcus aureus infection as the cause of diseases classified elsewhere: Secondary | ICD-10-CM

## 2018-07-10 DIAGNOSIS — Z794 Long term (current) use of insulin: Secondary | ICD-10-CM

## 2018-07-10 DIAGNOSIS — R7881 Bacteremia: Secondary | ICD-10-CM

## 2018-07-10 DIAGNOSIS — E11621 Type 2 diabetes mellitus with foot ulcer: Secondary | ICD-10-CM

## 2018-07-10 DIAGNOSIS — D638 Anemia in other chronic diseases classified elsewhere: Secondary | ICD-10-CM

## 2018-07-10 DIAGNOSIS — Z89422 Acquired absence of other left toe(s): Secondary | ICD-10-CM

## 2018-07-10 DIAGNOSIS — N179 Acute kidney failure, unspecified: Secondary | ICD-10-CM

## 2018-07-10 DIAGNOSIS — E1143 Type 2 diabetes mellitus with diabetic autonomic (poly)neuropathy: Secondary | ICD-10-CM

## 2018-07-10 DIAGNOSIS — E1169 Type 2 diabetes mellitus with other specified complication: Secondary | ICD-10-CM

## 2018-07-10 DIAGNOSIS — Z91013 Allergy to seafood: Secondary | ICD-10-CM

## 2018-07-10 DIAGNOSIS — L97519 Non-pressure chronic ulcer of other part of right foot with unspecified severity: Secondary | ICD-10-CM

## 2018-07-10 DIAGNOSIS — Z91018 Allergy to other foods: Secondary | ICD-10-CM

## 2018-07-10 DIAGNOSIS — N189 Chronic kidney disease, unspecified: Secondary | ICD-10-CM

## 2018-07-10 DIAGNOSIS — M869 Osteomyelitis, unspecified: Secondary | ICD-10-CM

## 2018-07-10 DIAGNOSIS — L97529 Non-pressure chronic ulcer of other part of left foot with unspecified severity: Secondary | ICD-10-CM

## 2018-07-10 DIAGNOSIS — E1122 Type 2 diabetes mellitus with diabetic chronic kidney disease: Secondary | ICD-10-CM

## 2018-07-10 LAB — GLUCOSE, CAPILLARY
Glucose-Capillary: 112 mg/dL — ABNORMAL HIGH (ref 70–99)
Glucose-Capillary: 121 mg/dL — ABNORMAL HIGH (ref 70–99)
Glucose-Capillary: 233 mg/dL — ABNORMAL HIGH (ref 70–99)
Glucose-Capillary: 241 mg/dL — ABNORMAL HIGH (ref 70–99)
Glucose-Capillary: 288 mg/dL — ABNORMAL HIGH (ref 70–99)
Glucose-Capillary: 297 mg/dL — ABNORMAL HIGH (ref 70–99)

## 2018-07-10 LAB — CBC
HEMATOCRIT: 27.9 % — AB (ref 39.0–52.0)
Hemoglobin: 9 g/dL — ABNORMAL LOW (ref 13.0–17.0)
MCH: 26.7 pg (ref 26.0–34.0)
MCHC: 32.3 g/dL (ref 30.0–36.0)
MCV: 82.8 fL (ref 80.0–100.0)
Platelets: 276 10*3/uL (ref 150–400)
RBC: 3.37 MIL/uL — ABNORMAL LOW (ref 4.22–5.81)
RDW: 14.6 % (ref 11.5–15.5)
WBC: 8 10*3/uL (ref 4.0–10.5)
nRBC: 0 % (ref 0.0–0.2)

## 2018-07-10 LAB — BASIC METABOLIC PANEL
Anion gap: 6 (ref 5–15)
BUN: 40 mg/dL — ABNORMAL HIGH (ref 6–20)
CO2: 22 mmol/L (ref 22–32)
Calcium: 8.1 mg/dL — ABNORMAL LOW (ref 8.9–10.3)
Chloride: 106 mmol/L (ref 98–111)
Creatinine, Ser: 1.68 mg/dL — ABNORMAL HIGH (ref 0.61–1.24)
GFR calc Af Amer: >60 mL/min (ref >60)
GFR calc non Af Amer: 54 mL/min — ABNORMAL LOW (ref >60)
Glucose, Bld: 196 mg/dL — ABNORMAL HIGH (ref 70–99)
Potassium: 3.7 mmol/L (ref 3.5–5.1)
Sodium: 134 mmol/L — ABNORMAL LOW (ref 135–145)

## 2018-07-10 LAB — VANCOMYCIN, RANDOM: Vancomycin Rm: 19

## 2018-07-10 LAB — CK: Total CK: 44 U/L — ABNORMAL LOW (ref 49–397)

## 2018-07-10 MED ORDER — ONDANSETRON HCL 4 MG PO TABS: 4.0000 mg | ORAL_TABLET | Freq: Four times a day (QID) | ORAL | Status: DC | PRN

## 2018-07-10 MED ORDER — INSULIN ASPART 100 UNIT/ML ~~LOC~~ SOLN
0.0000 [IU] | Freq: Every day | SUBCUTANEOUS | Status: DC
  Administered 2018-07-10: 2 [IU] via SUBCUTANEOUS
  Administered 2018-07-10: 02:00:00 3 [IU] via SUBCUTANEOUS
  Filled 2018-07-10 (×2): qty 1

## 2018-07-10 MED ORDER — AMLODIPINE BESYLATE 10 MG PO TABS
10.0000 mg | ORAL_TABLET | Freq: Every day | ORAL | Status: DC
  Administered 2018-07-10 – 2018-07-11 (×2): 10 mg via ORAL
  Filled 2018-07-10 (×2): qty 1

## 2018-07-10 MED ORDER — INFLUENZA VAC SPLIT QUAD 0.5 ML IM SUSY
0.5000 mL | PREFILLED_SYRINGE | INTRAMUSCULAR | Status: AC
  Administered 2018-07-11: 09:00:00 0.5 mL via INTRAMUSCULAR
  Filled 2018-07-10: qty 0.5

## 2018-07-10 MED ORDER — ACETAMINOPHEN 325 MG PO TABS: 650.0000 mg | ORAL_TABLET | Freq: Four times a day (QID) | ORAL | Status: DC | PRN

## 2018-07-10 MED ORDER — PREMIER PROTEIN SHAKE
11.0000 [oz_av] | Freq: Two times a day (BID) | ORAL | Status: DC
  Administered 2018-07-10 – 2018-07-11 (×4): 11 [oz_av] via ORAL

## 2018-07-10 MED ORDER — OXYCODONE HCL 5 MG PO TABS: 5.0000 mg | ORAL_TABLET | Freq: Four times a day (QID) | ORAL | Status: DC | PRN

## 2018-07-10 MED ORDER — PROMETHAZINE HCL 25 MG/ML IJ SOLN
12.5000 mg | Freq: Once | INTRAMUSCULAR | Status: AC
  Administered 2018-07-10: 05:00:00 12.5 mg via INTRAVENOUS
  Filled 2018-07-10: qty 1

## 2018-07-10 MED ORDER — VANCOMYCIN IV (FOR PTA / DISCHARGE USE ONLY): 1000.0000 mg | INTRAVENOUS | Status: DC

## 2018-07-10 MED ORDER — BISACODYL 10 MG RE SUPP
10.0000 mg | Freq: Every day | RECTAL | Status: DC | PRN
  Filled 2018-07-10: qty 1

## 2018-07-10 MED ORDER — INSULIN ASPART 100 UNIT/ML ~~LOC~~ SOLN
0.0000 [IU] | Freq: Three times a day (TID) | SUBCUTANEOUS | Status: DC
  Administered 2018-07-10: 3 [IU] via SUBCUTANEOUS
  Filled 2018-07-10: qty 1

## 2018-07-10 MED ORDER — ONDANSETRON HCL 4 MG/2ML IJ SOLN
4.0000 mg | Freq: Four times a day (QID) | INTRAMUSCULAR | Status: DC | PRN
  Administered 2018-07-10 – 2018-07-11 (×6): 4 mg via INTRAVENOUS
  Filled 2018-07-10 (×6): qty 2

## 2018-07-10 MED ORDER — VITAMIN C 500 MG PO TABS
250.0000 mg | ORAL_TABLET | Freq: Two times a day (BID) | ORAL | Status: DC
  Administered 2018-07-10 – 2018-07-11 (×3): 250 mg via ORAL
  Filled 2018-07-10 (×3): qty 1

## 2018-07-10 MED ORDER — OXYCODONE-ACETAMINOPHEN 5-325 MG PO TABS: 1.0000 | ORAL_TABLET | Freq: Four times a day (QID) | ORAL | Status: DC | PRN

## 2018-07-10 MED ORDER — SODIUM CHLORIDE 0.9 % IV SOLN
INTRAVENOUS | Status: DC
  Administered 2018-07-10 – 2018-07-11 (×2): via INTRAVENOUS

## 2018-07-10 MED ORDER — INSULIN GLARGINE 100 UNIT/ML ~~LOC~~ SOLN
25.0000 [IU] | Freq: Every day | SUBCUTANEOUS | Status: DC
  Administered 2018-07-10 (×2): 25 [IU] via SUBCUTANEOUS
  Filled 2018-07-10 (×3): qty 0.25

## 2018-07-10 MED ORDER — OXYCODONE-ACETAMINOPHEN 5-325 MG PO TABS
1.0000 | ORAL_TABLET | Freq: Four times a day (QID) | ORAL | Status: DC | PRN
  Administered 2018-07-10 (×2): 1 via ORAL
  Filled 2018-07-10 (×2): qty 1

## 2018-07-10 MED ORDER — ALPRAZOLAM 0.25 MG PO TABS
0.2500 mg | ORAL_TABLET | Freq: Three times a day (TID) | ORAL | Status: DC | PRN
  Administered 2018-07-10 – 2018-07-11 (×2): 0.25 mg via ORAL
  Filled 2018-07-10 (×2): qty 1

## 2018-07-10 MED ORDER — SODIUM CHLORIDE 0.9 % IV SOLN
425.0000 mg | Freq: Every day | INTRAVENOUS | Status: DC
  Administered 2018-07-11: 425 mg via INTRAVENOUS
  Filled 2018-07-10: qty 8.5

## 2018-07-10 MED ORDER — ZOLPIDEM TARTRATE 5 MG PO TABS
5.0000 mg | ORAL_TABLET | Freq: Every evening | ORAL | Status: DC | PRN
  Administered 2018-07-10: 23:00:00 5 mg via ORAL
  Filled 2018-07-10: qty 1

## 2018-07-10 MED ORDER — FERROUS SULFATE 325 (65 FE) MG PO TABS
325.0000 mg | ORAL_TABLET | Freq: Two times a day (BID) | ORAL | Status: DC
  Administered 2018-07-10 – 2018-07-11 (×3): 325 mg via ORAL
  Filled 2018-07-10 (×3): qty 1

## 2018-07-10 MED ORDER — MORPHINE SULFATE (PF) 2 MG/ML IV SOLN
2.0000 mg | INTRAVENOUS | Status: DC | PRN
  Administered 2018-07-10 – 2018-07-11 (×7): 2 mg via INTRAVENOUS
  Filled 2018-07-10 (×7): qty 1

## 2018-07-10 MED ORDER — ACETAMINOPHEN 650 MG RE SUPP: 650.0000 mg | Freq: Four times a day (QID) | RECTAL | Status: DC | PRN

## 2018-07-10 MED ORDER — VANCOMYCIN HCL IN DEXTROSE 1-5 GM/200ML-% IV SOLN
1000.0000 mg | INTRAVENOUS | Status: DC
  Administered 2018-07-10: 09:00:00 1000 mg via INTRAVENOUS
  Filled 2018-07-10 (×2): qty 200

## 2018-07-10 MED ORDER — DIPHENOXYLATE-ATROPINE 2.5-0.025 MG PO TABS: 1.0000 | ORAL_TABLET | Freq: Four times a day (QID) | ORAL | Status: DC | PRN

## 2018-07-10 MED ORDER — INSULIN ASPART 100 UNIT/ML ~~LOC~~ SOLN
0.0000 [IU] | Freq: Three times a day (TID) | SUBCUTANEOUS | Status: DC
  Administered 2018-07-10 – 2018-07-11 (×3): 3 [IU] via SUBCUTANEOUS
  Filled 2018-07-10 (×3): qty 1

## 2018-07-10 MED ORDER — ENOXAPARIN SODIUM 40 MG/0.4ML ~~LOC~~ SOLN: 40.0000 mg | SUBCUTANEOUS | Status: DC

## 2018-07-10 MED ORDER — INSULIN REGULAR HUMAN 100 UNIT/ML IJ SOLN: 0.0000 [IU] | Freq: Three times a day (TID) | INTRAMUSCULAR | Status: DC | PRN

## 2018-07-10 MED ORDER — PNEUMOCOCCAL VAC POLYVALENT 25 MCG/0.5ML IJ INJ
0.5000 mL | INJECTION | INTRAMUSCULAR | Status: AC
  Administered 2018-07-11: 09:00:00 0.5 mL via INTRAMUSCULAR
  Filled 2018-07-10: qty 0.5

## 2018-07-10 MED ORDER — OXYCODONE HCL 5 MG PO TABS
5.0000 mg | ORAL_TABLET | Freq: Four times a day (QID) | ORAL | Status: DC | PRN
  Administered 2018-07-10: 05:00:00 5 mg via ORAL
  Filled 2018-07-10: qty 1

## 2018-07-10 MED ORDER — GABAPENTIN 300 MG PO CAPS
300.0000 mg | ORAL_CAPSULE | Freq: Three times a day (TID) | ORAL | Status: DC
  Administered 2018-07-10 – 2018-07-11 (×5): 300 mg via ORAL
  Filled 2018-07-10 (×5): qty 1

## 2018-07-10 MED ORDER — ALBUTEROL SULFATE (2.5 MG/3ML) 0.083% IN NEBU: 2.5000 mg | INHALATION_SOLUTION | Freq: Four times a day (QID) | RESPIRATORY_TRACT | Status: DC | PRN

## 2018-07-10 MED ORDER — HEPARIN SODIUM (PORCINE) 5000 UNIT/ML IJ SOLN
5000.0000 [IU] | Freq: Three times a day (TID) | INTRAMUSCULAR | Status: DC
  Administered 2018-07-10 – 2018-07-11 (×5): 5000 [IU] via SUBCUTANEOUS
  Filled 2018-07-10 (×5): qty 1

## 2018-07-10 MED ORDER — HYDRALAZINE HCL 50 MG PO TABS
25.0000 mg | ORAL_TABLET | Freq: Three times a day (TID) | ORAL | Status: DC
  Administered 2018-07-10 – 2018-07-11 (×5): 25 mg via ORAL
  Filled 2018-07-10 (×5): qty 1

## 2018-07-10 MED ORDER — POLYETHYLENE GLYCOL 3350 17 G PO PACK: 17.0000 g | PACK | Freq: Every day | ORAL | Status: DC | PRN

## 2018-07-10 MED ORDER — OXYCODONE-ACETAMINOPHEN 10-325 MG PO TABS: 1.0000 | ORAL_TABLET | Freq: Four times a day (QID) | ORAL | Status: DC | PRN

## 2018-07-10 MED ORDER — ALBUTEROL SULFATE (2.5 MG/3ML) 0.083% IN NEBU: 2.5000 mg | INHALATION_SOLUTION | Freq: Four times a day (QID) | RESPIRATORY_TRACT | Status: DC

## 2018-07-10 NOTE — Progress Notes (Signed)
Pharmacy Antibiotic Note  Shawn [name] is a 33 y.o. 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  Pharmacy has been consulted for vancomycin dosing. Patient arrives w/ purulent vascular access s/p PICC placement d/t vanc treatment for osteomyelitis. Prior dose of vanc PTA unknown as med rec states patient does not take. Patient received vanc 1g IV x 1 in ED.  Plan: Will order a random vanc level with am labs to assess vanc level  Will dose based on random levels--there is no documentation on HD treatment states patient has CKD. Goal random < 20 mcg/mL  Height: 5\' 6"  (167.6 cm) Weight: 116 lb 2.9 oz (52.7 kg) IBW/kg (Calculated) : 63.8  Temp (24hrs), Avg:98 F (36.7 C), Min:97.9 F (36.6 C), Max:98.1 F (36.7 C)  Recent Labs  Lab 07/03/18 1307 07/09/18 2103  WBC 11.1* 7.3  CREATININE 1.55* 1.95*  VANCORANDOM  --  4    Estimated Creatinine Clearance: 41.3 mL/min (A) (by C-G formula based on SCr of 1.95 mg/dL (H)).    Allergies  Allergen Reactions  . Banana Hives, Nausea And Vomiting and Rash  . Keflex [Cephalexin] Rash    No swelling- also taken penicillin without any issue.  . Onion Hives, Nausea And Vomiting and Rash  . Sulfa Antibiotics Anaphylaxis  . Grapeseed Extract [Nutritional Supplements] Itching  . Shellfish Allergy Hives    "ALL SEAFOOD"    Thank you for allowing pharmacy to be a part of this patient's care.  Tobie Lords, PharmD, BCPS Clinical Pharmacist 07/10/2018

## 2018-07-10 NOTE — Progress Notes (Signed)
[facility] NAME: Shawn [name]    MR#:  [id number]  DATE OF BIRTH:  March 25, 1986  SUBJECTIVE:  CHIEF COMPLAINT:   Chief Complaint  Patient presents with  . Vascular Access Problem   Came with malfunctioning PICC line.  Have complain of pain in both feet.  No fever REVIEW OF SYSTEMS:  CONSTITUTIONAL: No fever, have fatigue or weakness.  EYES: No blurred or double vision.  EARS, NOSE, AND THROAT: No tinnitus or ear pain.  RESPIRATORY: No cough, shortness of breath, wheezing or hemoptysis.  CARDIOVASCULAR: No chest pain, orthopnea, edema.  GASTROINTESTINAL: No nausea, vomiting, diarrhea or abdominal pain.  GENITOURINARY: No dysuria, hematuria.  ENDOCRINE: No polyuria, nocturia,  HEMATOLOGY: No anemia, easy bruising or bleeding SKIN: No rash or lesion. MUSCULOSKELETAL: No joint pain or arthritis.   NEUROLOGIC: No tingling, numbness, weakness.  PSYCHIATRY: No anxiety or depression.   ROS  DRUG ALLERGIES:   Allergies  Allergen Reactions  . Banana Hives, Nausea And Vomiting and Rash  . Keflex [Cephalexin] Rash    No swelling- also taken penicillin without any issue.  . Onion Hives, Nausea And Vomiting and Rash  . Sulfa Antibiotics Anaphylaxis  . Grapeseed Extract [Nutritional Supplements] Itching  . Shellfish Allergy Hives    "ALL SEAFOOD"    VITALS:  Blood pressure 137/87, pulse 88, temperature 98.2 F (36.8 C), temperature source Oral, resp. rate (!) 21, height 5\' 6"  (1.676 m), weight 52.7 kg, SpO2 100 %.  PHYSICAL EXAMINATION:  GENERAL:  33 y.o.-year-old thin patient lying in the bed with no acute distress.  EYES: Pupils equal, round, reactive to light and accommodation. No scleral icterus. Extraocular muscles intact.  HEENT: Head atraumatic, normocephalic. Oropharynx and nasopharynx clear.  NECK:  Supple, no jugular venous distention. No thyroid enlargement, no tenderness.  LUNGS: Normal breath sounds bilaterally, no  wheezing, rales,rhonchi or crepitation. No use of accessory muscles of respiration.  CARDIOVASCULAR: S1, S2 normal. No murmurs, rubs, or gallops.  ABDOMEN: Soft, nontender, nondistended. Bowel sounds present. No organomegaly or mass.  EXTREMITIES: No pedal edema, cyanosis, or clubbing.  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  Right arm redness at PICC line removal site. NEUROLOGIC: Cranial nerves II through XII are intact. Muscle strength 5/5 in all extremities. Sensation intact. Gait not checked.  PSYCHIATRIC: The patient is alert and oriented x 3.  SKIN: No obvious rash, lesion, or ulcer.   Physical Exam LABORATORY PANEL:   CBC Recent Labs  Lab 07/10/18 0443  WBC 8.0  HGB 9.0*  HCT 27.9*  PLT 276   ------------------------------------------------------------------------------------------------------------------  Chemistries  Recent Labs  Lab 07/10/18 0443  NA 134*  K 3.7  CL 106  CO2 22  GLUCOSE 196*  BUN 40*  CREATININE 1.68*  CALCIUM 8.1*   ------------------------------------------------------------------------------------------------------------------  Cardiac Enzymes No results for input(s): TROPONINI in the last 168 hours. ------------------------------------------------------------------------------------------------------------------  RADIOLOGY:  US Venous Img Upper Uni Right  Result Date: 07/09/2018 CLINICAL DATA:  RIGHT upper extremity pain, had a PICC line 1 month ago removed today EXAM: RIGHT UPPER EXTREMITY VENOUS DOPPLER ULTRASOUND TECHNIQUE: Gray-scale sonography with graded compression, as well as color Doppler and duplex ultrasound were performed to evaluate the upper extremity deep venous system from the level of the subclavian vein and including the jugular, axillary, basilic, radial, ulnar and upper cephalic vein. Spectral Doppler was utilized to evaluate flow at rest and with distal augmentation maneuvers. COMPARISON:  None  FINDINGS: Contralateral Subclavian  Vein: Respiratory phasicity is normal and symmetric with the symptomatic side. No evidence of thrombus. Normal compressibility. Internal Jugular Vein: No evidence of thrombus. Normal compressibility, respiratory phasicity and response to augmentation. Subclavian Vein: No evidence of thrombus. Normal compressibility, respiratory phasicity and response to augmentation. Axillary Vein: No evidence of thrombus. Normal compressibility, respiratory phasicity and response to augmentation. Cephalic Vein: No evidence of thrombus. Normal compressibility, respiratory phasicity and response to augmentation. Basilic Vein: Nonocclusive thrombus within RIGHT basilic vein at the site of prior PICC line placement. Vessel is incompletely compressible. Small amount of residual patent flow is seen surrounding the clot. Brachial Veins: No evidence of thrombus. Normal compressibility, respiratory phasicity and response to augmentation. Radial Veins: No evidence of thrombus. Normal compressibility, respiratory phasicity and response to augmentation. Ulnar Veins: No evidence of thrombus. Normal compressibility, respiratory phasicity and response to augmentation. Venous Reflux:  None visualized. Other Findings:  None visualized. IMPRESSION: Segment of nonocclusive thrombus within the mid RIGHT basilic vein at site of prior PICC line. Remaining deep venous system of the RIGHT upper extremity appears patent. Electronically Signed   By: Lavonia Dana M.D.   On: 07/09/2018 22:15   [place] Ekg Site Rite  Result Date: 07/10/2018 If Site Rite image not attached, placement could not be confirmed due to current cardiac rhythm.   ASSESSMENT AND PLAN:   Active Problems:   IV infusion line dysfunction ([place])  33 year old male with past medical history significant for type 1 diabetes mellitus, neuropathy, gastroparesis, bilateral MRSA diabetic foot infection/osteo-myelitis-on IV vancomycin through July 27, 2018,  presenting with dysfunctional right arm PICC line concern for ?  Infection of PICC line  *Acute dysfunctional/?  Infected PICC line  Removed in the emergency room  Referred to the observation unit, continue IV vancomycin, consult infectious disease provider for expert opinion, and continue close medical monitoring  Note-right arm does not appear abnormal, noted erythema underneath Picc-line dressing which may be local skin reaction, does not appear infected on my evaluation, wait for PICC line tip culture.  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 Minimal weightbearing and okay to have bathroom privileges using a walker. Changed to Daptomycin per ID due to CKD.  *Acute hyperglycemia with chronic diabetes mellitus, type I Suspect exacerbation by underlying infection  IV fluids for rehydration, continue home insulin regiment, high-dose sliding scale insulin with Accu-Cheks per routine   *Chronic depressionand anxiety- Stable  Continue home regiment   *Anemia of chronic disease and iron deficiency Stable  Continue iron supplementation  *CKD III BL Cr 1.7, currently 1.9 Avoid nephrotoxic agents    All the records are reviewed and case discussed with Care Management/Social Workerr. Management plans discussed with the patient, family and they are in agreement.  CODE STATUS: full.  TOTAL TIME TAKING CARE OF THIS PATIENT: 35 minutes.     POSSIBLE D/C IN 1-2 DAYS, DEPENDING ON CLINICAL CONDITION.   Vaughan Basta M.D on 07/10/2018   Between 7am to 6pm - Pager - [phone]  After 6pm go to [email or web address] - password [email or web address] [facility]  Office  [phone]  CC: Primary care physician; Valera Castle, MD  Note: This dictation was prepared with  Dragon dictation along with smaller phrase technology. Any transcriptional errors that result from this process are unintentional.

## 2018-07-10 NOTE — Consult Note (Signed)
NAME: Shawn [name]  DOB: 17-Mar-1986  MRN: [id number]  Date/Time: 07/10/2018 9:01 PM  salary Subjective:  REASON FOR CONSULT: ?infected PICC ? REN [name] is a 33 y.o.male  with a IDDM, CKD, B/L  Diabetic foot infection ,  osteomyelitis s/p amputation of toes, recent MRSA bacteremia and on Home IV antibiotic until 07/27/18 presented to the ED because of non functioning PICC and discharge from the PICC site as per his home nurse. He did not have any fever or chills. He has vomiting and diarrhea due to h/o gastroparesis. I am asked to see patient for possible PICC site infection. Pt was in [facility] between 12/11-12/17/19 for b/l foot infection and underwent  Bilateral percutaneous Achilles lengthenings Resection of fifth metatarsal base and tuberosities with 3 anchor peroneus brevis tendon to the bone with 3-0 Vicryl   Surgical fracture of third metatarsal right foot   Debridement of ulcerations.  These were excisional debridements of wounds on the right foot at fifth met tuberosity which was 2 cm long and 1.8 cm in width with 8 mm of depth.  The third metatarsal head ulcer on the right foot was 1 cm in diameter with 3 mm of depth and the left foot ulceration was 9 mm in diameter with 1.5 cm of depth. He was discharged on IV vancomycin as blood culture positive for MRSA and both feet surgical culture positive for the same. At home because of worsening renal function vanco was changed to dapto last week  Past Medical History:  Diagnosis Date  . CKD (chronic kidney disease)   . Diabetes mellitus without complication ([place])   . GERD (gastroesophageal reflux disease)   . IBS (irritable bowel syndrome)   . Osteomyelitis [facility])     Past Surgical History:  Procedure Laterality Date  . ABDOMINAL AORTOGRAM W/LOWER EXTREMITY Right 12/23/2017   Procedure: ABDOMINAL AORTOGRAM W/LOWER EXTREMITY;  Surgeon: Katha Cabal, MD;  Location: [place] [facility];  Service: Cardiovascular;  Laterality: Right;   . ACHILLES TENDON SURGERY Bilateral 06/16/2018   Procedure: ACHILLES LENGTHENING/KIDNER;  Surgeon: Albertine Patricia, DPM;  Location: [facility];  Service: Podiatry;  Laterality: Bilateral;  . AMPUTATION Right 12/24/2017   Procedure: AMPUTATION RAY;  Surgeon: Sharlotte Alamo, DPM;  Location: [facility];  Service: Podiatry;  Laterality: Right;  . AMPUTATION Left 04/06/2018   Procedure: AMPUTATION RAY;  Surgeon: Sharlotte Alamo, DPM;  Location: [facility];  Service: Podiatry;  Laterality: Left;  . AMPUTATION Left 04/09/2018   Procedure: AMPUTATION RAY;  Surgeon: Sharlotte Alamo, DPM;  Location: [facility];  Service: Podiatry;  Laterality: Left;  . APPLICATION OF WOUND VAC Right 12/24/2017   Procedure: APPLICATION OF WOUND VAC;  Surgeon: Sharlotte Alamo, DPM;  Location: [facility];  Service: Podiatry;  Laterality: Right;  . APPLICATION OF WOUND VAC Right 01/01/2018   Procedure: APPLICATION OF WOUND VAC;  Surgeon: Albertine Patricia, DPM;  Location: [facility];  Service: Podiatry;  Laterality: Right;  . BONE EXCISION Bilateral 06/16/2018   Procedure: BONE EXCISION AND SOFT TISSUE;  Surgeon: Albertine Patricia, DPM;  Location: [facility];  Service: Podiatry;  Laterality: Bilateral;  . IRRIGATION AND DEBRIDEMENT FOOT Right 12/20/2017   Procedure: IRRIGATION AND DEBRIDEMENT FOOT;  Surgeon: Sharlotte Alamo, DPM;  Location: [facility];  Service: Podiatry;  Laterality: Right;  . IRRIGATION AND DEBRIDEMENT FOOT Right 12/24/2017   Procedure: IRRIGATION AND DEBRIDEMENT FOOT;  Surgeon: Sharlotte Alamo, DPM;  Location: [facility];  Service: Podiatry;  Laterality: Right;  . IRRIGATION AND DEBRIDEMENT FOOT Right  01/01/2018   Procedure: IRRIGATION AND DEBRIDEMENT FOOT-SKIN,SOFT TISSUE AND BONE;  Surgeon: Albertine Patricia, DPM;  Location: [facility];  Service: Podiatry;  Laterality: Right;  . IRRIGATION AND DEBRIDEMENT FOOT Left 04/06/2018   Procedure: IRRIGATION AND DEBRIDEMENT FOOT;  Surgeon: Sharlotte Alamo, DPM;  Location: [facility];  Service: Podiatry;  Laterality:  Left;  . LOWER EXTREMITY ANGIOGRAPHY Left 04/06/2018   Procedure: Lower Extremity Angiography;  Surgeon: Algernon Huxley, MD;  Location: [place] [facility];  Service: Cardiovascular;  Laterality: Left;    Social History   Socioeconomic History  . Marital status: Single    Spouse name: Not on file  . Number of children: Not on file  . Years of education: Not on file  . Highest education level: Not on file  Occupational History  . Not on file  Social Needs  . Financial resource strain: Not on file  . Food insecurity:    Worry: Not on file    Inability: Not on file  . Transportation needs:    Medical: Not on file    Non-medical: Not on file  Tobacco Use  . Smoking status: Never Smoker  . Smokeless tobacco: Never Used  Substance and Sexual Activity  . Alcohol use: No    Frequency: Never  . Drug use: Yes    Types: Marijuana, PCP  . Sexual activity: Not on file  Lifestyle  . Physical activity:    Days per week: Not on file    Minutes per session: Not on file  . Stress: Not on file  Relationships  . Social connections:    Talks on phone: Not on file    Gets together: Not on file    Attends religious service: Not on file    Active member of club or organization: Not on file    Attends meetings of clubs or organizations: Not on file    Relationship status: Not on file  . Intimate partner violence:    Fear of current or ex partner: Not on file    Emotionally abused: Not on file    Physically abused: Not on file    Forced sexual activity: Not on file  Other Topics Concern  . Not on file  Social History Narrative  . Not on file    Family History  Problem Relation Age of Onset  . Diabetes Mother   . Ovarian cancer Mother   . Healthy Father    Allergies  Allergen Reactions  . Banana Hives, Nausea And Vomiting and Rash  . Keflex [Cephalexin] Rash    No swelling- also taken penicillin without any issue.  . Onion Hives, Nausea And Vomiting and Rash  . Sulfa Antibiotics  Anaphylaxis  . Grapeseed Extract [Nutritional Supplements] Itching  . Shellfish Allergy Hives    "ALL SEAFOOD"  ? Current Facility-Administered Medications  Medication Dose Route Frequency Provider Last Rate Last Dose  . 0.9 %  sodium chloride infusion   Intravenous Continuous Vaughan Basta, MD 75 mL/hr at 07/10/18 1439    . acetaminophen (TYLENOL) tablet 650 mg  650 mg Oral Q6H PRN Salary, Montell D, MD       Or  . acetaminophen (TYLENOL) suppository 650 mg  650 mg Rectal Q6H PRN Salary, Montell D, MD      . albuterol (PROVENTIL) (2.5 MG/3ML) 0.083% nebulizer solution 2.5 mg  2.5 mg Nebulization Q6H PRN Salary, Montell D, MD      . ALPRAZolam Duanne Moron) tablet 0.25 mg  0.25 mg Oral TID  PRN Salary, Holly Bodily D, MD      . amLODipine (NORVASC) tablet 10 mg  10 mg Oral Daily Salary, Montell D, MD   10 mg at 07/10/18 0826  . bisacodyl (DULCOLAX) suppository 10 mg  10 mg Rectal Daily PRN Salary, Montell D, MD      . Derrill Memo ON 07/11/2018] DAPTOmycin (CUBICIN) 425 mg in sodium chloride 0.9 % IVPB  425 mg Intravenous Q2000 Berton Mount, RPH      . diphenoxylate-atropine (LOMOTIL) 2.5-0.025 MG per tablet 1 tablet  1 tablet Oral QID PRN Salary, Montell D, MD      . ferrous sulfate tablet 325 mg  325 mg Oral BID WC Salary, Montell D, MD   325 mg at 07/10/18 1702  . gabapentin (NEURONTIN) capsule 300 mg  300 mg Oral TID Loney Hering D, MD   300 mg at 07/10/18 1701  . heparin injection 5,000 Units  5,000 Units Subcutaneous Q8H Salary, Montell D, MD   5,000 Units at 07/10/18 1434  . hydrALAZINE (APRESOLINE) tablet 25 mg  25 mg Oral Q8H Salary, Montell D, MD   25 mg at 07/10/18 1434  . [START ON 07/11/2018] Influenza vac split quadrivalent PF (FLUARIX) injection 0.5 mL  0.5 mL Intramuscular Tomorrow-1000 Salary, Montell D, MD      . insulin aspart (novoLOG) injection 0-5 Units  0-5 Units Subcutaneous QHS Loney Hering D, MD   3 Units at 07/10/18 0214  . insulin aspart (novoLOG) injection 0-9 Units   0-9 Units Subcutaneous TID WC Vaughan Basta, MD   3 Units at 07/10/18 1723  . insulin glargine (LANTUS) injection 25 Units  25 Units Subcutaneous QHS Loney Hering D, MD   25 Units at 07/10/18 0244  . morphine 2 MG/ML injection 2 mg  2 mg Intravenous Q4H PRN Vaughan Basta, MD   2 mg at 07/10/18 1705  . ondansetron (ZOFRAN) tablet 4 mg  4 mg Oral Q6H PRN Salary, Montell D, MD       Or  . ondansetron (ZOFRAN) injection 4 mg  4 mg Intravenous Q6H PRN Salary, Montell D, MD   4 mg at 07/10/18 1949  . oxyCODONE-acetaminophen (PERCOCET/ROXICET) 5-325 MG per tablet 1 tablet  1 tablet Oral Q6H PRN Vaughan Basta, MD       And  . oxyCODONE (Oxy IR/ROXICODONE) immediate release tablet 5 mg  5 mg Oral Q6H PRN Vaughan Basta, MD      . Derrill Memo ON 07/11/2018] pneumococcal 23 valent vaccine (PNU-IMMUNE) injection 0.5 mL  0.5 mL Intramuscular Tomorrow-1000 Salary, Montell D, MD      . polyethylene glycol (MIRALAX / GLYCOLAX) packet 17 g  17 g Oral Daily PRN Salary, Montell D, MD      . protein supplement (PREMIER PROTEIN) liquid - approved for s/p bariatric surgery  11 oz Oral BID BM Salary, Montell D, MD   11 oz at 07/10/18 1442  . vitamin C (ASCORBIC ACID) tablet 250 mg  250 mg Oral BID Salary, Montell D, MD   250 mg at 07/10/18 5053     Abtx:  Anti-infectives (From admission, onward)   Start     Dose/Rate Route Frequency Ordered Stop   07/11/18 1200  DAPTOmycin (CUBICIN) 425 mg in sodium chloride 0.9 % IVPB     425 mg 217 mL/hr over 30 Minutes Intravenous Daily 07/10/18 1218     07/10/18 0800  vancomycin (VANCOCIN) IVPB 1000 mg/200 mL premix  Status:  Discontinued     1,000 mg 200  mL/hr over 60 Minutes Intravenous Every 24 hours 07/10/18 0720 07/10/18 1215   07/10/18 0115  vancomycin IVPB  Status:  Discontinued    Note to Pharmacy:  Indication: MRSA bacteremia, Diabetic foot infection and osteomyelitis Last Day of Therapy: 07/27/2018 Labs - Sunday/Monday:  CBC/D, BMP,  and vancomycin trough. Labs - Thursday:  BMP and vancomycin trough Labs - Every other week:  ESR and CRP     1,000 mg Intravenous Every 24 hours 07/10/18 0111 07/10/18 0157   07/09/18 2200  vancomycin (VANCOCIN) IVPB 1000 mg/200 mL premix     1,000 mg 200 mL/hr over 60 Minutes Intravenous  Once 07/09/18 2149 07/10/18 0047      REVIEW OF SYSTEMS:  Const: negative fever, negative chills, negative weight loss Eyes: negative diplopia or visual changes, negative eye pain ENT: negative coryza, negative sore throat Resp: negative cough, hemoptysis, dyspnea Cards: negative for chest pain, palpitations, lower extremity edema GU: negative for frequency, dysuria and hematuria GI: has epigastric pain, vomiting, has diarrhea,  Skin: negative for rash and pruritus Heme: negative for easy bruising and gum/nose bleeding MS: negative for myalgias, arthralgias, back pain and muscle weakness Neurolo:negative for headaches, dizziness, vertigo, memory problems  Psych: feelings of anxiety, depression  Endocrine: polyuria Allergy/Immunology- h/o medication and  food allergies ?  Objective:  VITALS:  BP (!) 166/111 (BP Location: Left Arm)   Pulse 96   Temp 98.5 F (36.9 C) (Oral)   Resp 20   Ht '5\' 6"'$  (1.676 m)   Wt 52.7 kg   SpO2 99%   BMI 18.75 kg/m  PHYSICAL EXAM:  General: Alert, cooperative, no distress, appears stated age. pale Head: Normocephalic, without obvious abnormality, atraumatic. Eyes: Conjunctivae clear, anicteric sclerae. Pupils are equal ENT Nares normal. No drainage or sinus tenderness. Lips, mucosa, and tongue normal. No Thrush Neck: Supple, symmetrical, no adenopathy, thyroid: non tender no carotid bruit and no JVD. Back: No CVA tenderness. Lungs: Clear to auscultation bilaterally. No Wheezing or Rhonchi. No rales. Heart: Regular rate and rhythm, no murmur, rub or gallop. Abdomen: Soft, non-tender,not distended. Bowel sounds normal. No masses Extremities: rt picc site-  line removed- no purulent discharge- erythematous reaction to the tape B/l feet- 4th and 5th toe  Has been amputated- pressure ulce ron the lateral margin- healing        Skin: No rashes or lesions. Or bruising Lymph: Cervical, supraclavicular normal. Neurologic: Grossly non-focal Pertinent Labs Lab Results CBC    Component Value Date/Time   WBC 8.0 07/10/2018 0443   RBC 3.37 (L) 07/10/2018 0443   HGB 9.0 (L) 07/10/2018 0443   HGB 16.4 10/06/2014 0539   HCT 27.9 (L) 07/10/2018 0443   HCT 47.4 10/06/2014 0539   PLT 276 07/10/2018 0443   PLT 250 10/06/2014 0539   MCV 82.8 07/10/2018 0443   MCV 84 10/06/2014 0539   MCH 26.7 07/10/2018 0443   MCHC 32.3 07/10/2018 0443   RDW 14.6 07/10/2018 0443   RDW 12.7 10/06/2014 0539   LYMPHSABS 1.7 07/09/2018 2103   LYMPHSABS 2.0 10/06/2014 0539   MONOABS 0.8 07/09/2018 2103   MONOABS 0.8 10/06/2014 0539   EOSABS 0.5 07/09/2018 2103   EOSABS 0.0 10/06/2014 0539   BASOSABS 0.0 07/09/2018 2103   BASOSABS 0.0 10/06/2014 0539    CMP Latest Ref Rng & Units 07/10/2018 07/09/2018 07/03/2018  Glucose 70 - 99 mg/dL 196(H) 687(HH) 310(H)  BUN 6 - 20 mg/dL 40(H) 43(H) 29(H)  Creatinine 0.61 - 1.24 mg/dL 1.68(H) 1.95(H) 1.55(H)  Sodium 135 - 145 mmol/L 134(L) 128(L) 133(L)  Potassium 3.5 - 5.1 mmol/L 3.7 4.7 4.3  Chloride 98 - 111 mmol/L 106 97(L) 100  CO2 22 - 32 mmol/L 22 21(L) 22  Calcium 8.9 - 10.3 mg/dL 8.1(L) 8.4(L) 8.5(L)  Total Protein 6.5 - 8.1 g/dL - - 6.7  Total Bilirubin 0.3 - 1.2 mg/dL - - 0.6  Alkaline Phos 38 - 126 U/L - - 123  AST 15 - 41 U/L - - 20  ALT 0 - 44 U/L - - 25      Microbiology: Recent Results (from the past 240 hour(s))  Blood Culture (routine x 2)     Status: None (Preliminary result)   Collection Time: 07/09/18  9:03 PM  Result Value Ref Range Status   Specimen Description BLOOD RIGHT AC  Final   Special Requests   Final    BOTTLES DRAWN AEROBIC AND ANAEROBIC Blood Culture results may not be optimal  due to an excessive volume of blood received in culture bottles   Culture   Final    NO GROWTH < 12 HOURS Performed at [facility], [address], [place], [place] [address]    Report Status PENDING  Incomplete  Blood Culture (routine x 2)     Status: None (Preliminary result)   Collection Time: 07/09/18  9:03 PM  Result Value Ref Range Status   Specimen Description BLOOD LEFT HAND  Final   Special Requests BOTTLES DRAWN AEROBIC AND ANAEROBIC [place]  Final   Culture   Final    NO GROWTH < 12 HOURS Performed at [facility], [address] [place], [place] [address]    Report Status PENDING  Incomplete  Wound or Superficial Culture     Status: None (Preliminary result)   Collection Time: 07/09/18  9:03 PM  Result Value Ref Range Status   Specimen Description WOUND RIGHT ARM  Final   Special Requests NONE  Final   Gram Stain   Final    NO WBC SEEN NO ORGANISMS SEEN Performed at [facility], [place] [address], [place],  [address]    Culture PENDING  Incomplete   Report Status PENDING  Incomplete   IMAGING RESULTS: ? Impression/Recommendation ?33 y.o.male  with a IDDM, CKD, B/L  Diabetic foot infection ,  osteomyelitis s/p amputation of toes, recent MRSA bacteremia and on Home IV antibiotic until 07/27/18 presented to the ED because of non functioning PICC and discharge from the PICC site as per his home nurse. He did not have any fever or chills. He has vomiting and diarrhea due to h/o gastroparesis.  Non functioning PICC with question of PICC site infection- line removed- cultures sent ( Tip and blood), afebrile and does not look systemically ill  Recent MRSA bacteremia-getting home IV- last day 07/27/18- ( vanco changed to dapto because of worsening Cr)  Currently getting  vanco in the hospital -change to dapto because of Renal insufficiency. If blood culture negative tomorrow can get a new PICC  Diabetic foot infection with Osteomyelitis both  feet - s/p surgery both feet- MRSA infection - is getting 6 weeks of Iv Wound healing well  IDDM- poorly controlled AKI on CKD  ?Anemia of chronic disease  Gastroparesis due to DM ? ___________________________________________________ Discussed with patient, and hospitalist Note:  This document was prepared using Dragon voice recognition software and may include unintentional dictation errors.

## 2018-07-10 NOTE — Care Management Note (Signed)
Case Management Note  Patient Details  Name: DIAGO HAIK MRN: [id number] Date of Birth: [birth year]  Subjective/Objective:  Patient is under observation for PICC line dysfunction.  PICC line may have been infected and it was removed in the ER.  Patient has PIV"s and is receiving IV antibiotics.  Patient has been on IV Vancomycin at home- [facility] has been providing Skilled nursing for IV and wound care.  ID has been consulted for this visit.  Patient lives at home with his dad and 100 year old son.  Patient reports that he has a walker and is having a wheelchair delivered this Wed.  At discharge will resume home health services with [facility]. Doran Clay RN BSN [phone]                   Action/Plan:   Expected Discharge Date:  07/12/18               Expected Discharge Plan:  [place]  In-House Referral:     Discharge planning Services  CM Consult  Post Acute Care Choice:    Choice offered to:     DME Arranged:    DME Agency:     HH Arranged:  RN [place] Agency:  [place]  Status of Service:  In process, will continue to follow  If discussed at Long Length of Stay Meetings, dates discussed:    Additional Comments:  Shelbie Hutching, RN 07/10/2018, 4:10 PM

## 2018-07-10 NOTE — Consult Note (Signed)
[facility] Nurse wound consult note Reason for Consult: bilateral foot wounds.  Patient is followed by podiatry on the outpatient basis.  Last seen by Dr. Elvina Mattes last week per patient. Managed at home conservatively with saline moist dressings.  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 Pressure Injury POA:NA Measurement: Left lateral 2 areas proximal 2cm x 0.3cm x 0.1cm and distal area 0.5cm x 0.3cm x 0.1cm; both 100% clean, pale pink tissue, minimal drainage but some odor noted from site  Left great toe: 0.5cm x 0.3cm x 0.1cm; + brown/yellow drainage with odor; minimal Right lateral foot: two small areas that are open 100% yellow, minimal drainage Wound bed: see above Drainage (amount, consistency, odor) see above  Periwound: intact, weak distal pulses, no edema  Dressing procedure/placement/frequency: Saline damp 2x2 gauze over the open wounds, cover with dry dressing, secure with conform. Top with 4" ACE wrap. Change daily.  Follow up with Dr. Elvina Mattes as scheduled on outpatient basis.    Discussed POC with patient and bedside nurse.  Re consult if needed, will not follow at this time. Thanks  Marin Wisner [employer], RN,CWOCN, CNS, [facility] [phone])

## 2018-07-10 NOTE — Plan of Care (Signed)

## 2018-07-10 NOTE — Progress Notes (Signed)
Pharmacy Antibiotic Note  Shawn [name] is a 33 y.o. 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  Pharmacy has been consulted for vancomycin dosing. Patient arrives w/ purulent vascular access s/p PICC placement d/t vanc treatment for osteomyelitis. Prior dose of vanc PTA unknown as med rec states patient does not take. Patient received vanc 1g IV x 1 in ED. - Pharmacy notified by ID physician that his vancomycin was changed to daptomycin on 1/3 due to worsening renal function.  Plan to resume daptomycin as will continue this as outpatient  Today, 07/10/2018   SCr = 1.68 with CrCl ~68ml/min  Repeat Bcx from 1/5 NGTD,  Wound cx pending  Plan:  Vancomycin dose given this am so resume daptomycin 425mg  IV q24h tomorrow  IV Antibiotics to continue until 07/27/2018  Monitor weekly CK   Height: 5\' 6"  (167.6 cm) Weight: 116 lb 2.9 oz (52.7 kg) IBW/kg (Calculated) : 63.8  Temp (24hrs), Avg:98.1 F (36.7 C), Min:97.9 F (36.6 C), Max:98.2 F (36.8 C)  Recent Labs  Lab 07/03/18 1307 07/09/18 2103 07/10/18 0443  WBC 11.1* 7.3 8.0  CREATININE 1.55* 1.95* 1.68*  VANCORANDOM  --  4 19    Estimated Creatinine Clearance: 47.9 mL/min (A) (by C-G formula based on SCr of 1.68 mg/dL (H)).    Allergies  Allergen Reactions  . Banana Hives, Nausea And Vomiting and Rash  . Keflex [Cephalexin] Rash    No swelling- also taken penicillin without any issue.  . Onion Hives, Nausea And Vomiting and Rash  . Sulfa Antibiotics Anaphylaxis  . Grapeseed Extract [Nutritional Supplements] Itching  . Shellfish Allergy Hives    "ALL SEAFOOD"    Thank you for allowing pharmacy to be a part of this patient's care.  Doreene Eland, PharmD, BCPS.   Work Cell: [phone] 07/10/2018 12:10 PM

## 2018-07-10 NOTE — Progress Notes (Signed)
Pharmacy Antibiotic Note  Shawn [name] is a 33 y.o. 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  Pharmacy has been consulted for vancomycin dosing. Patient arrives w/ purulent vascular access s/p PICC placement d/t vanc treatment for osteomyelitis. Prior dose of vanc PTA unknown as med rec states patient does not take. Patient received vanc 1g IV x 1 in ED.  Plan: Per chart review prior to recent discharge patient was ordered vancomycin 1000 mg every 24 hours for outpatient regimen.  Random level at 0430 resulted at 19.   Due to the level and prior dosing regimen, will order Vancomycin 1000 mg IV every 24 hours.  Goal trough 15-20 mcg/mL.  Will draw level prior to the fourth dose.   Height: 5\' 6"  (167.6 cm) Weight: 116 lb 2.9 oz (52.7 kg) IBW/kg (Calculated) : 63.8  Temp (24hrs), Avg:98 F (36.7 C), Min:97.9 F (36.6 C), Max:98.1 F (36.7 C)  Recent Labs  Lab 07/03/18 1307 07/09/18 2103 07/10/18 0443  WBC 11.1* 7.3 8.0  CREATININE 1.55* 1.95* 1.68*  VANCORANDOM  --  4 19    Estimated Creatinine Clearance: 47.9 mL/min (A) (by C-G formula based on SCr of 1.68 mg/dL (H)).    Allergies  Allergen Reactions  . Banana Hives, Nausea And Vomiting and Rash  . Keflex [Cephalexin] Rash    No swelling- also taken penicillin without any issue.  . Onion Hives, Nausea And Vomiting and Rash  . Sulfa Antibiotics Anaphylaxis  . Grapeseed Extract [Nutritional Supplements] Itching  . Shellfish Allergy Hives    "ALL SEAFOOD"    Thank you for allowing pharmacy to be a part of this patient's care.  Evelena Asa, PharmD Clinical Pharmacist 07/10/2018

## 2018-07-10 NOTE — Progress Notes (Signed)
Inpatient Diabetes Program Recommendations  AACE/ADA: New Consensus Statement on Inpatient Glycemic Control (2015)  Target Ranges:  Prepandial:   less than 140 mg/dL      Peak postprandial:   less than 180 mg/dL (1-2 hours)      Critically ill patients:  140 - 180 mg/dL   Lab Results  Component Value Date   GLUCAP 112 (H) 07/10/2018   HGBA1C 10.8 (H) 06/14/2018    Review of Glycemic Control Results for COYLE, STORDAHL (MRN [id number]) as of 07/10/2018 14:00  Ref. Range 07/09/2018 23:18 07/10/2018 00:54 07/10/2018 01:16 07/10/2018 07:49 07/10/2018 11:58  Glucose-Capillary Latest Ref Range: 70 - 99 mg/dL 528 (HH) 297 (H) 288 (H) 121 (H) 112 (H)   Diabetes history: Type 1 DM Outpatient Diabetes medications:  Lantus 25 units q HS, Regular insulin 3 units with each meal plus sliding scale Current orders for Inpatient glycemic control:  Lantus 25 units q HS, Novolog resistant tid with meals and HS  Inpatient Diabetes Program Recommendations:    Note that patient was D/c'd from Hospital on 12/17 and was seen by DM coordinator during that admission.  A1C's have been trending down however still uncontrolled.    Due to hx. Of Type 1 DM, consider reducing Novolog correction to sensitive tid with meals and HS.  Also consider adding Novolog meal coverage 3 units tid with meals (hold if patient eats less than 50%).  Will follow.   Thanks,  Adah Perl, RN, BC-ADM Inpatient Diabetes Coordinator Pager [phone] (8a-5p)

## 2018-07-11 ENCOUNTER — Ambulatory Visit: Payer: Medicaid Other | Admitting: Infectious Diseases

## 2018-07-11 ENCOUNTER — Observation Stay: Payer: Self-pay

## 2018-07-11 LAB — GLUCOSE, CAPILLARY
Glucose-Capillary: 229 mg/dL — ABNORMAL HIGH (ref 70–99)
Glucose-Capillary: 243 mg/dL — ABNORMAL HIGH (ref 70–99)

## 2018-07-11 LAB — CK: Total CK: 49 U/L (ref 49–397)

## 2018-07-11 LAB — BASIC METABOLIC PANEL
Anion gap: 6 (ref 5–15)
BUN: 51 mg/dL — ABNORMAL HIGH (ref 6–20)
CO2: 22 mmol/L (ref 22–32)
Calcium: 8.3 mg/dL — ABNORMAL LOW (ref 8.9–10.3)
Chloride: 105 mmol/L (ref 98–111)
Creatinine, Ser: 2.01 mg/dL — ABNORMAL HIGH (ref 0.61–1.24)
GFR calc Af Amer: 50 mL/min — ABNORMAL LOW (ref >60)
GFR calc non Af Amer: 43 mL/min — ABNORMAL LOW (ref >60)
Glucose, Bld: 192 mg/dL — ABNORMAL HIGH (ref 70–99)
Potassium: 3.9 mmol/L (ref 3.5–5.1)
Sodium: 133 mmol/L — ABNORMAL LOW (ref 135–145)

## 2018-07-11 MED ORDER — SODIUM CHLORIDE 0.9% FLUSH
10.0000 mL | Freq: Two times a day (BID) | INTRAVENOUS | Status: DC
  Administered 2018-07-11: 10 mL

## 2018-07-11 MED ORDER — DAPTOMYCIN IV (FOR PTA / DISCHARGE USE ONLY): 425.0000 mg | INTRAVENOUS | 0 refills | Status: AC

## 2018-07-11 MED ORDER — OXYCODONE-ACETAMINOPHEN 10-325 MG PO TABS: 1.0000 | ORAL_TABLET | ORAL | 0 refills | Status: DC | PRN

## 2018-07-11 MED ORDER — SODIUM CHLORIDE 0.9% FLUSH: 10.0000 mL | INTRAVENOUS | Status: DC | PRN

## 2018-07-11 NOTE — Consult Note (Signed)
PHARMACY CONSULT NOTE FOR:  OUTPATIENT  PARENTERAL ANTIBIOTIC THERAPY (OPAT)  Indication:  MRSA osteomyelitis Regimen: Daptomycin 425 mg every 24 hours End date: 07/27/2018  IV antibiotic discharge orders are pended. To discharging provider:  please sign these orders via discharge navigator,  Select New Orders & click on the button choice - Manage This Unsigned Work.     Thank you for allowing pharmacy to be a part of this patient's care.  Forrest Moron, PharmD Clinical Pharmacist 07/11/2018, 12:50 PM

## 2018-07-11 NOTE — Care Management Note (Signed)
Case Management Note  Patient Details  Name: Shawn [name] MRN: [id number] Date of Birth: Apr 01, 1986  Subjective/Objective:                 Patient to discharge home today.  His Daptomycin order has been changed to 425 mg IV q 24 hours.  His mother will transport home.  Patient received his daily dose today at 1:30p.   Action/Plan:  Notified [facility] of discharge and change in Daptomycin dose and  frequency.  Expected Discharge Date:  07/11/18               Expected Discharge Plan:  [place]  In-House Referral:     Discharge planning Services  CM Consult  Post Acute Care Choice:  Resumption of Svcs/PTA Provider Choice offered to:  Patient  DME Arranged:    DME Agency:  [facility] Arranged:  RN, IV Antibiotics HH Agency:  [facility]  Status of Service:  Completed, signed off  If discussed at [place] of Stay Meetings, dates discussed:    Additional Comments:  Katrina Stack, RN 07/11/2018, 2:49 PM

## 2018-07-11 NOTE — Progress Notes (Signed)
Peripherally Inserted Central Catheter/Midline Placement  The IV Nurse has discussed with the patient and/or persons authorized to consent for the patient, the purpose of this procedure and the potential benefits and risks involved with this procedure.  The benefits include less needle sticks, lab draws from the catheter, and the patient may be discharged home with the catheter. Risks include, but not limited to, infection, bleeding, blood clot (thrombus formation), and puncture of an artery; nerve damage and irregular heartbeat and possibility to perform a PICC exchange if needed/ordered by physician.  Alternatives to this procedure were also discussed.  Bard Power PICC patient education guide, fact sheet on infection prevention and patient information card has been provided to patient /or left at bedside.    PICC/Midline Placement Documentation  PICC Single Lumen 07/11/18 PICC Right Brachial 39 cm 0 cm (Active)  Indication for Insertion or Continuance of Line Home intravenous therapies (PICC only) 07/11/2018 11:29 AM  Exposed Catheter (cm) 0 cm 07/11/2018 11:29 AM  Site Assessment Clean;Dry;Intact 07/11/2018 11:29 AM  Line Status Flushed;Blood return noted 07/11/2018 11:29 AM  Dressing Type Transparent 07/11/2018 11:29 AM  Dressing Status Clean;Dry;Intact;Antimicrobial disc in place 07/11/2018 11:29 AM  Dressing Intervention New dressing 07/11/2018 11:29 AM  Dressing Change Due 07/18/18 07/11/2018 11:29 AM       Shawn [name] 07/11/2018, 11:30 AM

## 2018-07-11 NOTE — Discharge Summary (Signed)
[facility] NAME: Shawn [name]    MR#:  [id number]  DATE OF BIRTH:  August 08, 1985  DATE OF ADMISSION:  07/09/2018 ADMITTING PHYSICIAN: Gorden Harms, MD  DATE OF DISCHARGE: 07/11/2018   PRIMARY CARE PHYSICIAN: Valera Castle, MD    ADMISSION DIAGNOSIS:  Pain [R52] Hyperglycemia [R73.9] PICC line infection, initial encounter [T80.219A]  DISCHARGE DIAGNOSIS:  Active Problems:   IV infusion line dysfunction ([place])   SECONDARY DIAGNOSIS:   Past Medical History:  Diagnosis Date  . CKD (chronic kidney disease)   . Diabetes mellitus without complication ([place])   . GERD (gastroesophageal reflux disease)   . IBS (irritable bowel syndrome)   . Osteomyelitis [facility])     HOSPITAL COURSE:   33 year old male with past medical history significant for type 1 diabetes mellitus, neuropathy, gastroparesis, bilateral MRSA diabetic foot infection/osteo-myelitis-on IV vancomycin through July 27, 2018, presenting with dysfunctional right arm PICC line concern for?Infection of PICC line  *Acute dysfunctional/?Infected PICC line  Removed in the emergency room  Referred to the observation unit, continue IV vancomycin, consult infectious disease provider for expert opinion, and continue close medical monitoring  Note-right arm does not appear abnormal, noted erythema underneathPicc-linedressing which may belocalskin reaction, does not appear infected on my evaluation, negative so far PICC line tip culture.  Blood cultures are negative.  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 Minimal weightbearing  Changed to Daptomycin per ID due to CKD. As his blood culture and catheter tip cultures are negative, new PICC line placed and  discharging home with daptomycin to finish the course until 23rd January.  *Acute hyperglycemia with chronic diabetes mellitus, type I Suspect exacerbation by underlying infection  IV fluids for rehydration, continue home insulin regiment, high-dose sliding scale insulin with Accu-Cheks per routine  *Chronic depressionand anxiety- Stable  Continue home regiment  *Anemia of chronic disease and iron deficiency Stable  Continue iron supplementation  *CKD III BL Cr 1.7, currently 1.9 Avoid nephrotoxic agents   DISCHARGE CONDITIONS:   Stable  CONSULTS OBTAINED:  Treatment Team:  Tsosie Billing, MD  DRUG ALLERGIES:   Allergies  Allergen Reactions  . Banana Hives, Nausea And Vomiting and Rash  . Keflex [Cephalexin] Rash    No swelling- also taken penicillin without any issue.  . Onion Hives, Nausea And Vomiting and Rash  . Sulfa Antibiotics Anaphylaxis  . Grapeseed Extract [Nutritional Supplements] Itching  . Shellfish Allergy Hives    "ALL SEAFOOD"    DISCHARGE MEDICATIONS:   Allergies as of 07/11/2018      Reactions   Banana Hives, Nausea And Vomiting, Rash   Keflex [cephalexin] Rash   No swelling- also taken penicillin without any issue.   Onion Hives, Nausea And Vomiting, Rash   Sulfa Antibiotics Anaphylaxis   Grapeseed Extract [nutritional Supplements] Itching   Shellfish Allergy Hives   "ALL SEAFOOD"      Medication List    STOP taking these medications   ampicillin  IVPB   lisinopril 20 MG tablet Commonly known as:  PRINIVIL,ZESTRIL   vancomycin  IVPB     TAKE these medications   ALPRAZolam 0.25 MG tablet Commonly known as:  XANAX Take 1 tablet (0.25 mg total) by mouth 3 (three) times daily as needed for anxiety.   amLODipine 10 MG  tablet Commonly known as:  NORVASC Take 1 tablet (10 mg total) by mouth daily.   ascorbic acid 250 MG tablet Commonly known as:  VITAMIN C Take 1 tablet (250 mg total) by mouth 2 (two) times  daily.   daptomycin  IVPB Commonly known as:  CUBICIN Inject 425 mg into the vein daily for 16 days. Indication:  MRSA Bacteremia Last Day of Therapy:  07/27/2018 Labs - Once weekly:  CBC/D, BMP, and CPK Labs - Every other week:  ESR and CRP   diphenoxylate-atropine 2.5-0.025 MG tablet Commonly known as:  LOMOTIL Take 1 tablet by mouth 4 (four) times daily as needed for diarrhea or loose stools.   ferrous sulfate 325 (65 FE) MG tablet Take 1 tablet (325 mg total) by mouth 2 (two) times daily with a meal.   gabapentin 300 MG capsule Commonly known as:  NEURONTIN Take 1 capsule (300 mg total) by mouth 3 (three) times daily.   hydrALAZINE 25 MG tablet Commonly known as:  APRESOLINE Take 1 tablet (25 mg total) by mouth every 8 (eight) hours.   insulin glargine 100 UNIT/ML injection Commonly known as:  LANTUS Inject 0.25 mLs (25 Units total) into the skin at bedtime.   Insulin Syringes (Disposable) U-100 0.3 ML Misc 1 Syringe by Does not apply route 4 (four) times daily -  with meals and at bedtime.   NOVOLIN R RELION 100 units/mL injection Generic drug:  insulin regular Inject 0-0.1 mLs (0-10 Units total) into the skin 3 (three) times daily as needed for high blood sugar (sliding scale). What changed:    how much to take  additional instructions   oxyCODONE-acetaminophen 10-325 MG tablet Commonly known as:  PERCOCET Take 1 tablet by mouth every 4 (four) hours as needed for pain.   protein supplement shake Liqd Commonly known as:  PREMIER PROTEIN Take 325 mLs (11 oz total) by mouth 2 (two) times daily between meals.            Home Infusion Instuctions  (From admission, onward)         Start     Ordered   07/11/18 0000  Home infusion instructions [facility] May follow [place] Dosing Protocol; May administer Cathflo as needed to maintain patency of vascular access device.; Flushing of vascular access device: per [facility] Protocol: 0.9% NaCl pre/post  medica...    Question Answer Comment  Instructions May follow [place] Dosing Protocol   Instructions May administer Cathflo as needed to maintain patency of vascular access device.   Instructions Flushing of vascular access device: per [facility] Protocol: 0.9% NaCl pre/post medication administration and prn patency; Heparin 100 u/ml, 42m for implanted ports and Heparin 10u/ml, 561mfor all other central venous catheters.   Instructions May follow [facility] Anaphylaxis Protocol for First Dose Administration in the home: 0.9% NaCl at 25-50 ml/hr to maintain IV access for protocol meds. Epinephrine 0.3 ml IV/IM PRN and Benadryl 25-50 IV/IM PRN s/s of anaphylaxis.   Instructions [facility] Infusion Coordinator (RN) to assist per patient IV care needs in the home PRN.      07/11/18 1425           DISCHARGE INSTRUCTIONS:    PICC line care is routine, continue IV antibiotic and nurse to follow at home.  If you experience worsening of your admission symptoms, develop shortness of breath, life threatening emergency, suicidal or homicidal thoughts you must seek medical attention immediately by calling 911 or calling your MD  immediately  if symptoms less severe.  You Must read complete instructions/literature along with all the possible adverse reactions/side effects for all the Medicines you take and that have been prescribed to you. Take any new Medicines after you have completely understood and accept all the possible adverse reactions/side effects.   Please note  You were cared for by a hospitalist during your hospital stay. If you have any questions about your discharge medications or the care you received while you were in the hospital after you are discharged, you can call the unit and asked to speak with the hospitalist on call if the hospitalist that took care of you is not available. Once you are discharged, your primary care physician will handle any further medical issues. Please note that  NO REFILLS for any discharge medications will be authorized once you are discharged, as it is imperative that you return to your primary care physician (or establish a relationship with a primary care physician if you do not have one) for your aftercare needs so that they can reassess your need for medications and monitor your lab values.    Today   CHIEF COMPLAINT:   Chief Complaint  Patient presents with  . Vascular Access Problem    HISTORY OF PRESENT ILLNESS:  Aqib Lough  is a 33 y.o. male with a known history of recently discharged from our hospital last month for bilateral lower extremity diabetic foot infection/osteomyelitis-on chronic IV vancomycin through July 27, 2018 via PICC line, presents emergency room for dysfunctional PICC line, per ED attending-PICC line removed due to dysfunction/noted purulent drainage per ED attending, sodium 128, chloride 97, glucose 687, hospitalist asked to admit for further evaluation/care, patient evaluated in the emergency room, no apparent distress, resting comfortably in bed, patient is now been admitted for dysfunctional and ? infected right PICC line-status post removal in the emergency room.   VITAL SIGNS:  Blood pressure (!) 135/92, pulse 91, temperature 97.9 F (36.6 C), temperature source Oral, resp. rate 20, height '5\' 6"'$  (1.676 m), weight 52.7 kg, SpO2 100 %.  I/O:    Intake/Output Summary (Last 24 hours) at 07/11/2018 1547 Last data filed at 07/11/2018 0520 Gross per 24 hour  Intake -  Output 1740 ml  Net -1740 ml    PHYSICAL EXAMINATION:   GENERAL:  33 y.o.-year-old thin patient lying in the bed with no acute distress.  EYES: Pupils equal, round, reactive to light and accommodation. No scleral icterus. Extraocular muscles intact.  HEENT: Head atraumatic, normocephalic. Oropharynx and nasopharynx clear.  NECK:  Supple, no jugular venous distention. No thyroid enlargement, no tenderness.  LUNGS: Normal breath sounds  bilaterally, no wheezing, rales,rhonchi or crepitation. No use of accessory muscles of respiration.  CARDIOVASCULAR: S1, S2 normal. No murmurs, rubs, or gallops.  ABDOMEN: Soft, nontender, nondistended. Bowel sounds present. No organomegaly or mass.  EXTREMITIES: No pedal edema, cyanosis, or clubbing.  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  Right arm redness at PICC line removal site. NEUROLOGIC: Cranial nerves II through XII are intact. Muscle strength 5/5 in all extremities. Sensation intact. Gait not checked.  PSYCHIATRIC: The patient is alert and oriented x 3.  SKIN: No obvious rash, lesion, or ulcer.  DATA REVIEW:   CBC Recent Labs  Lab 07/10/18 0443  WBC 8.0  HGB 9.0*  HCT 27.9*  PLT 276    Chemistries  Recent Labs  Lab 07/11/18 0538  NA 133*  K 3.9  CL 105  CO2 22  GLUCOSE 192*  BUN 51*  CREATININE 2.01*  CALCIUM 8.3*    Cardiac Enzymes No results for input(s): TROPONINI in the last 168 hours.  Microbiology Results  Results for orders placed or performed during the hospital encounter of 07/09/18  Blood Culture (routine x 2)     Status: None (Preliminary result)   Collection Time: 07/09/18  9:03 PM  Result Value Ref Range Status   Specimen Description BLOOD RIGHT [facility]  Final   Special Requests   Final    BOTTLES DRAWN AEROBIC AND ANAEROBIC Blood Culture results may not be optimal due to an excessive volume of blood received in culture bottles   Culture   Final    NO GROWTH 2 DAYS Performed at [facility], [address], [place], [place] [address]    Report Status PENDING  Incomplete  Blood Culture (routine x 2)     Status: None (Preliminary result)   Collection Time: 07/09/18  9:03 PM  Result Value Ref Range Status   Specimen Description BLOOD LEFT HAND  Final   Special Requests BOTTLES DRAWN AEROBIC AND ANAEROBIC [place]  Final   Culture   Final    NO GROWTH 2 DAYS Performed at [facility], [address] [place], [place] [address]    Report Status PENDING  Incomplete  Wound or Superficial Culture     Status: None (Preliminary result)   Collection Time: 07/09/18  9:03 PM  Result Value Ref Range Status   Specimen Description WOUND RIGHT ARM  Final   Special Requests NONE  Final   Gram Stain   Final    NO WBC SEEN NO ORGANISMS SEEN Performed at [facility], [place] [address] [place], [place] [address]    Culture FEW KLEBSIELLA OXYTOCA  Final   Report Status PENDING  Incomplete  Cath Tip Culture     Status: None (Preliminary result)   Collection Time: 07/09/18  9:21 PM  Result Value Ref Range Status   Specimen Description   Final    CATH TIP Performed at [facility], [address] [place], [place] [address]    Special Requests   Final    NONE Performed at [facility], [address], [place], [place] [address]    Culture   Final    CULTURE REINCUBATED FOR BETTER GROWTH Performed at [facility], [place] [address], [place], [place] [address]    Report Status PENDING  Incomplete    RADIOLOGY:  US Venous Img Upper Uni Right  Result Date: 07/09/2018 CLINICAL DATA:  RIGHT upper extremity pain, had a PICC line 1 month ago removed today EXAM: RIGHT UPPER EXTREMITY VENOUS DOPPLER ULTRASOUND TECHNIQUE: Gray-scale sonography with graded compression, as well as color Doppler and duplex ultrasound were performed to evaluate the upper extremity deep venous system from the level of the subclavian vein and including the jugular, axillary, basilic, radial, ulnar and upper cephalic vein. Spectral Doppler was utilized to evaluate flow at rest and with distal augmentation maneuvers. COMPARISON:  None FINDINGS: Contralateral Subclavian Vein: Respiratory phasicity is normal and symmetric with the symptomatic side. No evidence of thrombus. Normal compressibility. Internal Jugular Vein: No evidence of thrombus. Normal compressibility, respiratory phasicity and response to  augmentation. Subclavian Vein: No evidence of thrombus. Normal compressibility, respiratory phasicity and response to augmentation. Axillary Vein: No evidence of thrombus. Normal compressibility, respiratory phasicity and response to augmentation. Cephalic Vein: No evidence of thrombus. Normal compressibility, respiratory phasicity and response  to augmentation. Basilic Vein: Nonocclusive thrombus within RIGHT basilic vein at the site of prior PICC line placement. Vessel is incompletely compressible. Small amount of residual patent flow is seen surrounding the clot. Brachial Veins: No evidence of thrombus. Normal compressibility, respiratory phasicity and response to augmentation. Radial Veins: No evidence of thrombus. Normal compressibility, respiratory phasicity and response to augmentation. Ulnar Veins: No evidence of thrombus. Normal compressibility, respiratory phasicity and response to augmentation. Venous Reflux:  None visualized. Other Findings:  None visualized. IMPRESSION: Segment of nonocclusive thrombus within the mid RIGHT basilic vein at site of prior PICC line. Remaining deep venous system of the RIGHT upper extremity appears patent. Electronically Signed   By: Lavonia Dana M.D.   On: 07/09/2018 22:15   [place] Ekg Site Rite  Result Date: 07/11/2018 If Site Rite image not attached, placement could not be confirmed due to current cardiac rhythm.  [place] Ekg Site Rite  Result Date: 07/10/2018 If Site Rite image not attached, placement could not be confirmed due to current cardiac rhythm.   EKG:   Orders placed or performed during the hospital encounter of 06/14/18  . EKG 12-Lead  . EKG 12-Lead  . ED EKG  . ED EKG  . EKG      Management plans discussed with the patient, family and they are in agreement.  CODE STATUS:     Code Status Orders  (From admission, onward)         Start     Ordered   07/10/18 0112  Full code  Continuous     07/10/18 0111        Code Status History     Date Active Date Inactive Code Status Order ID Comments User Context   06/14/2018 1744 06/20/2018 2123 Full Code [id number]  Loletha Grayer, MD ED   05/28/2018 0437 05/28/2018 1926 Full Code [id number]  Arta Silence, MD ED   04/27/2018 1726 05/02/2018 1713 Full Code [id number]  Henreitta Leber, MD Inpatient   04/05/2018 1724 04/11/2018 2142 Full Code [id number]  Dustin Flock, MD Inpatient   02/06/2018 2022 02/07/2018 1512 Full Code [id number]  Demetrios Loll, MD Inpatient   01/10/2018 2259 01/11/2018 1947 Full Code [id number]  Amelia Jo, MD Inpatient   12/30/2017 0019 01/03/2018 1457 Full Code [id number]  Harrie Foreman, MD Inpatient   12/20/2017 1541 12/27/2017 2147 Full Code [id number]  Sharlotte Alamo, [facility] Inpatient   11/10/2017 0049 11/11/2017 1632 Full Code [id number]  Amelia Jo, MD Inpatient   07/13/2017 2112 07/15/2017 1730 Full Code [id number]  Jules Husbands, MD ED      TOTAL TIME TAKING CARE OF THIS PATIENT: 35 minutes.    Vaughan Basta M.D on 07/11/2018 at 3:47 PM  Between 7am to 6pm - Pager - [phone]  After 6pm go to [email or web address] - password [email or web address] [facility]  Office  [phone]  CC: Primary care physician; Valera Castle, MD   Note: This dictation was prepared with Dragon dictation along with smaller phrase technology. Any transcriptional errors that result from this process are unintentional.

## 2018-07-11 NOTE — Plan of Care (Signed)
Picc placement today Problem: Health Behavior/Discharge Planning: Goal: Ability to manage health-related needs will improve Outcome: Progressing   Problem: Clinical Measurements: Goal: Ability to maintain clinical measurements within normal limits will improve Outcome: Progressing Goal: Will remain free from infection Outcome: Progressing Goal: Respiratory complications will improve Outcome: Progressing   Problem: Activity: Goal: Risk for activity intolerance will decrease Outcome: Progressing   Problem: Nutrition: Goal: Adequate nutrition will be maintained Outcome: Progressing   Problem: Coping: Goal: Level of anxiety will decrease Outcome: Progressing   Problem: Elimination: Goal: Will not experience complications related to bowel motility Outcome: Progressing   Problem: Pain Managment: Goal: General experience of comfort will improve Outcome: Progressing   Problem: Safety: Goal: Ability to remain free from injury will improve Outcome: Progressing   Problem: Skin Integrity: Goal: Risk for impaired skin integrity will decrease Outcome: Progressing

## 2018-07-11 NOTE — Progress Notes (Signed)
Inpatient Diabetes Program Recommendations  AACE/ADA: New Consensus Statement on Inpatient Glycemic Control   Target Ranges:  Prepandial:   less than 140 mg/dL      Peak postprandial:   less than 180 mg/dL (1-2 hours)      Critically ill patients:  140 - 180 mg/dL   Results for DMARCO, BALDUS (MRN [id number]) as of 07/11/2018 12:46  Ref. Range 07/10/2018 07:49 07/10/2018 11:58 07/10/2018 16:42 07/10/2018 21:04 07/11/2018 07:36 07/11/2018 11:49  Glucose-Capillary Latest Ref Range: 70 - 99 mg/dL 121 (H) 112 (H) 233 (H) 241 (H) 229 (H) 243 (H)  Results for RASEAN, JOOS (MRN [id number]) as of 07/11/2018 12:46  Ref. Range 07/13/2017 14:23 11/10/2017 04:46 12/19/2017 03:41 04/05/2018 17:39 06/14/2018 14:08  Hemoglobin A1C Latest Ref Range: 4.8 - 5.6 % 16.1 (H) 17.2 (H) 16.9 (H) 12.9 (H) 10.8 (H)   Review of Glycemic Control  Diabetes history: DM1 (makes NO insulin; requires basal, correction, and meal coverage insulin) Outpatient Diabetes medications: Lantus 25 units QHS, Regular 2 units with meals, plus additional units if glucose is elevated TID Current orders for Inpatient glycemic control: Latnus 25 units QHS, Novolog 0-9 units TID with meals, Novolog 0-5 units QHS  Inpatient Diabetes Program Recommendations:  Insulin-Meal Coverage: Please consider ordering Novolog 4 units TID with meals for meal coverage if patient eats at least 50% of meals.  HgbA1C: A1C 10.8% on 06/14/18 indicating an average glucose of 263 mg/dl over the past 2-3 months. Current A1C not at goal of 7% or less; however, improved from 12.9% on 04/05/18. Patient is well known to Inpatient Diabetes team. Patient has Type 1 DM and this is his 8th admission in the past 6 months.  Thanks, Shawn Alderman, RN, MSN, CDE Diabetes Coordinator Inpatient Diabetes Program [phone] (Team Pager from 8am to 5pm)

## 2018-07-11 NOTE — Progress Notes (Signed)
Pt for discharge home. A/o. No distress. Instructions discussed with pt  presc given and discussed.  Home meds discussed. Diet / activity and f/u discussed. Verbalized understanding.  Sl x2 d/cd. Left upper arm picc  With good blood return. Flushes well. abx given earlier. Waiting on ride to pick him up.

## 2018-07-12 LAB — AEROBIC CULTURE W GRAM STAIN (SUPERFICIAL SPECIMEN)

## 2018-07-12 LAB — AEROBIC CULTURE  (SUPERFICIAL SPECIMEN): GRAM STAIN: NONE SEEN

## 2018-07-13 LAB — CATH TIP CULTURE: Culture: 1000 — AB

## 2018-07-14 LAB — CULTURE, BLOOD (ROUTINE X 2)
Culture: NO GROWTH
Culture: NO GROWTH

## 2018-07-16 ENCOUNTER — Emergency Department: Payer: Medicaid Other

## 2018-07-16 ENCOUNTER — Observation Stay
Admission: EM | Admit: 2018-07-16 | Discharge: 2018-07-17 | Disposition: A | Discharge status: Home / Self Care | Payer: Medicaid Other | Attending: Internal Medicine | Admitting: Internal Medicine

## 2018-07-16 ENCOUNTER — Encounter: Payer: Self-pay | Admitting: Emergency Medicine

## 2018-07-16 ENCOUNTER — Other Ambulatory Visit: Payer: Self-pay

## 2018-07-16 DIAGNOSIS — I129 Hypertensive chronic kidney disease with stage 1 through stage 4 chronic kidney disease, or unspecified chronic kidney disease: Secondary | ICD-10-CM | POA: Diagnosis not present

## 2018-07-16 DIAGNOSIS — E876 Hypokalemia: Secondary | ICD-10-CM | POA: Insufficient documentation

## 2018-07-16 DIAGNOSIS — Z79899 Other long term (current) drug therapy: Secondary | ICD-10-CM | POA: Insufficient documentation

## 2018-07-16 DIAGNOSIS — Z833 Family history of diabetes mellitus: Secondary | ICD-10-CM | POA: Insufficient documentation

## 2018-07-16 DIAGNOSIS — R197 Diarrhea, unspecified: Secondary | ICD-10-CM | POA: Diagnosis present

## 2018-07-16 DIAGNOSIS — M869 Osteomyelitis, unspecified: Secondary | ICD-10-CM | POA: Insufficient documentation

## 2018-07-16 DIAGNOSIS — R072 Precordial pain: Secondary | ICD-10-CM | POA: Diagnosis not present

## 2018-07-16 DIAGNOSIS — E1043 Type 1 diabetes mellitus with diabetic autonomic (poly)neuropathy: Secondary | ICD-10-CM | POA: Diagnosis not present

## 2018-07-16 DIAGNOSIS — R000 Tachycardia, unspecified: Secondary | ICD-10-CM | POA: Diagnosis not present

## 2018-07-16 DIAGNOSIS — K589 Irritable bowel syndrome without diarrhea: Secondary | ICD-10-CM | POA: Diagnosis not present

## 2018-07-16 DIAGNOSIS — K529 Noninfective gastroenteritis and colitis, unspecified: Secondary | ICD-10-CM | POA: Diagnosis present

## 2018-07-16 DIAGNOSIS — R112 Nausea with vomiting, unspecified: Secondary | ICD-10-CM | POA: Diagnosis present

## 2018-07-16 DIAGNOSIS — Z89421 Acquired absence of other right toe(s): Secondary | ICD-10-CM | POA: Diagnosis not present

## 2018-07-16 DIAGNOSIS — K3184 Gastroparesis: Secondary | ICD-10-CM | POA: Diagnosis not present

## 2018-07-16 DIAGNOSIS — Z882 Allergy status to sulfonamides status: Secondary | ICD-10-CM | POA: Diagnosis not present

## 2018-07-16 DIAGNOSIS — L97518 Non-pressure chronic ulcer of other part of right foot with other specified severity: Secondary | ICD-10-CM | POA: Diagnosis not present

## 2018-07-16 DIAGNOSIS — Z89422 Acquired absence of other left toe(s): Secondary | ICD-10-CM | POA: Diagnosis not present

## 2018-07-16 DIAGNOSIS — L97528 Non-pressure chronic ulcer of other part of left foot with other specified severity: Secondary | ICD-10-CM | POA: Diagnosis not present

## 2018-07-16 DIAGNOSIS — F419 Anxiety disorder, unspecified: Secondary | ICD-10-CM | POA: Diagnosis not present

## 2018-07-16 DIAGNOSIS — N183 Chronic kidney disease, stage 3 (moderate): Secondary | ICD-10-CM | POA: Insufficient documentation

## 2018-07-16 DIAGNOSIS — Z881 Allergy status to other antibiotic agents status: Secondary | ICD-10-CM | POA: Insufficient documentation

## 2018-07-16 DIAGNOSIS — Z794 Long term (current) use of insulin: Secondary | ICD-10-CM | POA: Diagnosis not present

## 2018-07-16 DIAGNOSIS — R188 Other ascites: Secondary | ICD-10-CM | POA: Insufficient documentation

## 2018-07-16 DIAGNOSIS — E1065 Type 1 diabetes mellitus with hyperglycemia: Secondary | ICD-10-CM | POA: Insufficient documentation

## 2018-07-16 DIAGNOSIS — R109 Unspecified abdominal pain: Secondary | ICD-10-CM

## 2018-07-16 DIAGNOSIS — E1022 Type 1 diabetes mellitus with diabetic chronic kidney disease: Secondary | ICD-10-CM | POA: Diagnosis not present

## 2018-07-16 DIAGNOSIS — K219 Gastro-esophageal reflux disease without esophagitis: Secondary | ICD-10-CM | POA: Diagnosis not present

## 2018-07-16 LAB — TROPONIN I: Troponin I: <0.03 ng/mL (ref <0.03)

## 2018-07-16 LAB — HEPATIC FUNCTION PANEL
ALT: 52 U/L — ABNORMAL HIGH (ref 0–44)
AST: 31 U/L (ref 15–41)
Albumin: 2.6 g/dL — ABNORMAL LOW (ref 3.5–5.0)
Alkaline Phosphatase: 162 U/L — ABNORMAL HIGH (ref 38–126)
Bilirubin, Direct: <0.1 mg/dL (ref 0.0–0.2)
Total Bilirubin: 0.3 mg/dL (ref 0.3–1.2)
Total Protein: 6.1 g/dL — ABNORMAL LOW (ref 6.5–8.1)

## 2018-07-16 LAB — CBC
HCT: 27.6 % — ABNORMAL LOW (ref 39.0–52.0)
Hemoglobin: 9.3 g/dL — ABNORMAL LOW (ref 13.0–17.0)
MCH: 26.9 pg (ref 26.0–34.0)
MCHC: 33.7 g/dL (ref 30.0–36.0)
MCV: 79.8 fL — ABNORMAL LOW (ref 80.0–100.0)
Platelets: 315 10*3/uL (ref 150–400)
RBC: 3.46 MIL/uL — ABNORMAL LOW (ref 4.22–5.81)
RDW: 14.4 % (ref 11.5–15.5)
WBC: 8.5 10*3/uL (ref 4.0–10.5)
nRBC: 0 % (ref 0.0–0.2)

## 2018-07-16 LAB — INFLUENZA PANEL BY PCR (TYPE A & B)
Influenza A By PCR: NEGATIVE
Influenza B By PCR: NEGATIVE

## 2018-07-16 LAB — BASIC METABOLIC PANEL
Anion gap: 10 (ref 5–15)
BUN: 39 mg/dL — ABNORMAL HIGH (ref 6–20)
CALCIUM: 8.6 mg/dL — AB (ref 8.9–10.3)
CO2: 22 mmol/L (ref 22–32)
Chloride: 103 mmol/L (ref 98–111)
Creatinine, Ser: 1.78 mg/dL — ABNORMAL HIGH (ref 0.61–1.24)
GFR calc Af Amer: 58 mL/min — ABNORMAL LOW (ref >60)
GFR calc non Af Amer: 50 mL/min — ABNORMAL LOW (ref >60)
Glucose, Bld: 205 mg/dL — ABNORMAL HIGH (ref 70–99)
Potassium: 2.9 mmol/L — ABNORMAL LOW (ref 3.5–5.1)
Sodium: 135 mmol/L (ref 135–145)

## 2018-07-16 LAB — LIPASE, BLOOD: Lipase: 61 U/L — ABNORMAL HIGH (ref 11–51)

## 2018-07-16 LAB — URINALYSIS, COMPLETE (UACMP) WITH MICROSCOPIC
Bacteria, UA: NONE SEEN
Bilirubin Urine: NEGATIVE
Glucose, UA: >=500 mg/dL — AB
Ketones, ur: NEGATIVE mg/dL
Leukocytes, UA: NEGATIVE
NITRITE: NEGATIVE
Protein, ur: 100 mg/dL — AB
Specific Gravity, Urine: 1.003 — ABNORMAL LOW (ref 1.005–1.030)
pH: 5 (ref 5.0–8.0)

## 2018-07-16 LAB — GLUCOSE, CAPILLARY
GLUCOSE-CAPILLARY: 223 mg/dL — AB (ref 70–99)
Glucose-Capillary: 215 mg/dL — ABNORMAL HIGH (ref 70–99)

## 2018-07-16 MED ORDER — FERROUS SULFATE 325 (65 FE) MG PO TABS
325.0000 mg | ORAL_TABLET | Freq: Two times a day (BID) | ORAL | Status: DC
  Administered 2018-07-16 – 2018-07-17 (×3): 325 mg via ORAL
  Filled 2018-07-16 (×3): qty 1

## 2018-07-16 MED ORDER — MORPHINE SULFATE (PF) 2 MG/ML IV SOLN
2.0000 mg | INTRAVENOUS | Status: DC | PRN
  Administered 2018-07-16 – 2018-07-17 (×4): 2 mg via INTRAVENOUS
  Filled 2018-07-16 (×4): qty 1

## 2018-07-16 MED ORDER — SODIUM CHLORIDE 0.9 % IV SOLN
425.0000 mg | Freq: Once | INTRAVENOUS | Status: AC
  Administered 2018-07-17: 425 mg via INTRAVENOUS
  Filled 2018-07-16 (×2): qty 8.5

## 2018-07-16 MED ORDER — HYDRALAZINE HCL 50 MG PO TABS
25.0000 mg | ORAL_TABLET | Freq: Three times a day (TID) | ORAL | Status: DC
  Administered 2018-07-16 – 2018-07-17 (×4): 25 mg via ORAL
  Filled 2018-07-16 (×4): qty 1

## 2018-07-16 MED ORDER — ONDANSETRON HCL 4 MG/2ML IJ SOLN
INTRAMUSCULAR | Status: AC
  Administered 2018-07-16: 4 mg via INTRAVENOUS
  Filled 2018-07-16: qty 2

## 2018-07-16 MED ORDER — FENTANYL CITRATE (PF) 100 MCG/2ML IJ SOLN
50.0000 ug | Freq: Once | INTRAMUSCULAR | Status: AC
  Administered 2018-07-16: 50 ug via INTRAVENOUS
  Filled 2018-07-16: qty 2

## 2018-07-16 MED ORDER — SODIUM CHLORIDE 0.9 % IV SOLN
INTRAVENOUS | Status: AC
  Administered 2018-07-16: 18:00:00 via INTRAVENOUS

## 2018-07-16 MED ORDER — ONDANSETRON HCL 4 MG PO TABS: 4.0000 mg | ORAL_TABLET | Freq: Four times a day (QID) | ORAL | Status: DC | PRN

## 2018-07-16 MED ORDER — POTASSIUM CHLORIDE CRYS ER 20 MEQ PO TBCR
20.0000 meq | EXTENDED_RELEASE_TABLET | Freq: Once | ORAL | Status: AC
  Administered 2018-07-16: 20 meq via ORAL
  Filled 2018-07-16: qty 1

## 2018-07-16 MED ORDER — PANTOPRAZOLE SODIUM 40 MG IV SOLR
40.0000 mg | Freq: Two times a day (BID) | INTRAVENOUS | Status: DC
  Administered 2018-07-16 – 2018-07-17 (×2): 40 mg via INTRAVENOUS
  Filled 2018-07-16 (×2): qty 40

## 2018-07-16 MED ORDER — SODIUM CHLORIDE 0.9 % IV BOLUS
1000.0000 mL | Freq: Once | INTRAVENOUS | Status: AC
  Administered 2018-07-16: 1000 mL via INTRAVENOUS

## 2018-07-16 MED ORDER — MORPHINE SULFATE (PF) 4 MG/ML IV SOLN: 4.0000 mg | Freq: Once | INTRAVENOUS | Status: DC

## 2018-07-16 MED ORDER — DIPHENOXYLATE-ATROPINE 2.5-0.025 MG PO TABS: 1.0000 | ORAL_TABLET | Freq: Four times a day (QID) | ORAL | Status: DC | PRN

## 2018-07-16 MED ORDER — INSULIN ASPART 100 UNIT/ML ~~LOC~~ SOLN
0.0000 [IU] | Freq: Three times a day (TID) | SUBCUTANEOUS | Status: DC
  Administered 2018-07-16: 19:00:00 3 [IU] via SUBCUTANEOUS
  Administered 2018-07-17: 12:00:00 1 [IU] via SUBCUTANEOUS
  Administered 2018-07-17: 18:00:00 2 [IU] via SUBCUTANEOUS
  Filled 2018-07-16 (×3): qty 1

## 2018-07-16 MED ORDER — INSULIN GLARGINE 100 UNIT/ML ~~LOC~~ SOLN
25.0000 [IU] | Freq: Every day | SUBCUTANEOUS | Status: DC
  Administered 2018-07-16: 22:00:00 25 [IU] via SUBCUTANEOUS
  Filled 2018-07-16 (×2): qty 0.25

## 2018-07-16 MED ORDER — ONDANSETRON HCL 4 MG/2ML IJ SOLN
4.0000 mg | Freq: Four times a day (QID) | INTRAMUSCULAR | Status: DC | PRN
  Administered 2018-07-16 – 2018-07-17 (×3): 4 mg via INTRAVENOUS
  Filled 2018-07-16 (×3): qty 2

## 2018-07-16 MED ORDER — POTASSIUM CHLORIDE CRYS ER 20 MEQ PO TBCR: 40.0000 meq | EXTENDED_RELEASE_TABLET | Freq: Once | ORAL | Status: DC

## 2018-07-16 MED ORDER — ENOXAPARIN SODIUM 40 MG/0.4ML ~~LOC~~ SOLN
40.0000 mg | SUBCUTANEOUS | Status: DC
  Administered 2018-07-16: 40 mg via SUBCUTANEOUS
  Filled 2018-07-16: qty 0.4

## 2018-07-16 MED ORDER — IOHEXOL 350 MG/ML SOLN
100.0000 mL | Freq: Once | INTRAVENOUS | Status: AC | PRN
  Administered 2018-07-16: 100 mL via INTRAVENOUS

## 2018-07-16 MED ORDER — ALPRAZOLAM 0.25 MG PO TABS
0.2500 mg | ORAL_TABLET | Freq: Three times a day (TID) | ORAL | Status: DC | PRN
  Administered 2018-07-17: 09:00:00 0.25 mg via ORAL
  Filled 2018-07-16: qty 1

## 2018-07-16 MED ORDER — ONDANSETRON HCL 4 MG/2ML IJ SOLN
4.0000 mg | Freq: Once | INTRAMUSCULAR | Status: AC
  Administered 2018-07-16: 4 mg via INTRAVENOUS

## 2018-07-16 MED ORDER — AMLODIPINE BESYLATE 10 MG PO TABS
10.0000 mg | ORAL_TABLET | Freq: Every day | ORAL | Status: DC
  Administered 2018-07-16 – 2018-07-17 (×2): 10 mg via ORAL
  Filled 2018-07-16 (×2): qty 1

## 2018-07-16 MED ORDER — PROMETHAZINE HCL 25 MG/ML IJ SOLN
25.0000 mg | Freq: Four times a day (QID) | INTRAMUSCULAR | Status: DC | PRN
  Administered 2018-07-16 – 2018-07-17 (×3): 25 mg via INTRAVENOUS
  Filled 2018-07-16 (×3): qty 1

## 2018-07-16 MED ORDER — DAPTOMYCIN IV (FOR PTA / DISCHARGE USE ONLY): 425.0000 mg | INTRAVENOUS | Status: DC

## 2018-07-16 MED ORDER — GABAPENTIN 300 MG PO CAPS
300.0000 mg | ORAL_CAPSULE | Freq: Three times a day (TID) | ORAL | Status: DC
  Administered 2018-07-16 – 2018-07-17 (×3): 300 mg via ORAL
  Filled 2018-07-16 (×3): qty 1

## 2018-07-16 MED ORDER — INSULIN ASPART 100 UNIT/ML ~~LOC~~ SOLN: 0.0000 [IU] | Freq: Three times a day (TID) | SUBCUTANEOUS | Status: DC

## 2018-07-16 MED ORDER — OXYCODONE-ACETAMINOPHEN 5-325 MG PO TABS
1.0000 | ORAL_TABLET | ORAL | Status: DC | PRN
  Administered 2018-07-16 – 2018-07-17 (×4): 1 via ORAL
  Filled 2018-07-16 (×4): qty 1

## 2018-07-16 MED ORDER — SODIUM CHLORIDE 0.9 % IV SOLN
425.0000 mg | Freq: Once | INTRAVENOUS | Status: AC
  Administered 2018-07-16: 425 mg via INTRAVENOUS
  Filled 2018-07-16: qty 8.5

## 2018-07-16 NOTE — ED Notes (Signed)
Patient transported to X-ray 

## 2018-07-16 NOTE — H&P (Signed)
Shawn [name] at [place] NAME: Shawn [name]    MR#:  [id number]  DATE OF BIRTH:  [birth year]  DATE OF ADMISSION:  07/16/2018  PRIMARY CARE PHYSICIAN: Valera Castle, MD   REQUESTING/REFERRING PHYSICIAN:   CHIEF COMPLAINT:   Chief Complaint  Patient presents with  . Chest Pain    HISTORY OF PRESENT ILLNESS: Shawn [name]  is a 33 y.o. male with a known history of chronic kidney disease, diabetes, GERD, irritable bowel syndrome and bilateral diabetic foot infection who was recently in the hospital on January 7 test when he was discharged home.  He has a history of abdominal pain, nausea and vomiting.  Patient states that he has been having the symptoms for the past few days.  Also complains of some substernal chest pain.  I have admitted the patient before and he has similar complaints in the past.  Currently he is receiving IV antibiotics for osteomyelitis of his lower extremity PAST MEDICAL HISTORY:   Past Medical History:  Diagnosis Date  . CKD (chronic kidney disease)   . Diabetes mellitus without complication ([place])   . GERD (gastroesophageal reflux disease)   . IBS (irritable bowel syndrome)   . Osteomyelitis ([place])     PAST SURGICAL HISTORY:  Past Surgical History:  Procedure Laterality Date  . ABDOMINAL AORTOGRAM W/LOWER EXTREMITY Right 12/23/2017   Procedure: ABDOMINAL AORTOGRAM W/LOWER EXTREMITY;  Surgeon: Katha Cabal, MD;  Location: [facility];  Service: Cardiovascular;  Laterality: Right;  . ACHILLES TENDON SURGERY Bilateral 06/16/2018   Procedure: ACHILLES LENGTHENING/KIDNER;  Surgeon: Albertine Patricia, DPM;  Location: [facility];  Service: Podiatry;  Laterality: Bilateral;  . AMPUTATION Right 12/24/2017   Procedure: AMPUTATION RAY;  Surgeon: Sharlotte Alamo, DPM;  Location: [facility];  Service: Podiatry;  Laterality: Right;  . AMPUTATION Left 04/06/2018   Procedure: AMPUTATION RAY;  Surgeon: Sharlotte Alamo, DPM;   Location: [facility];  Service: Podiatry;  Laterality: Left;  . AMPUTATION Left 04/09/2018   Procedure: AMPUTATION RAY;  Surgeon: Sharlotte Alamo, DPM;  Location: [facility];  Service: Podiatry;  Laterality: Left;  . APPLICATION OF WOUND VAC Right 12/24/2017   Procedure: APPLICATION OF WOUND VAC;  Surgeon: Sharlotte Alamo, DPM;  Location: [facility];  Service: Podiatry;  Laterality: Right;  . APPLICATION OF WOUND VAC Right 01/01/2018   Procedure: APPLICATION OF WOUND VAC;  Surgeon: Albertine Patricia, DPM;  Location: [facility];  Service: Podiatry;  Laterality: Right;  . BONE EXCISION Bilateral 06/16/2018   Procedure: BONE EXCISION AND SOFT TISSUE;  Surgeon: Albertine Patricia, DPM;  Location: [facility];  Service: Podiatry;  Laterality: Bilateral;  . IRRIGATION AND DEBRIDEMENT FOOT Right 12/20/2017   Procedure: IRRIGATION AND DEBRIDEMENT FOOT;  Surgeon: Sharlotte Alamo, DPM;  Location: [facility];  Service: Podiatry;  Laterality: Right;  . IRRIGATION AND DEBRIDEMENT FOOT Right 12/24/2017   Procedure: IRRIGATION AND DEBRIDEMENT FOOT;  Surgeon: Sharlotte Alamo, DPM;  Location: [facility];  Service: Podiatry;  Laterality: Right;  . IRRIGATION AND DEBRIDEMENT FOOT Right 01/01/2018   Procedure: IRRIGATION AND DEBRIDEMENT FOOT-SKIN,SOFT TISSUE AND BONE;  Surgeon: Albertine Patricia, DPM;  Location: [facility];  Service: Podiatry;  Laterality: Right;  . IRRIGATION AND DEBRIDEMENT FOOT Left 04/06/2018   Procedure: IRRIGATION AND DEBRIDEMENT FOOT;  Surgeon: Sharlotte Alamo, DPM;  Location: [facility];  Service: Podiatry;  Laterality: Left;  . LOWER EXTREMITY ANGIOGRAPHY Left 04/06/2018   Procedure: Lower Extremity Angiography;  Surgeon: Algernon Huxley, MD;  Location: [facility];  Service: Cardiovascular;  Laterality: Left;    SOCIAL HISTORY:  Social History   Tobacco Use  . Smoking status: Never Smoker  . Smokeless tobacco: Never Used  Substance Use Topics  . Alcohol use: No    Frequency: Never    FAMILY HISTORY:  Family History   Problem Relation Age of Onset  . Diabetes Mother   . Ovarian cancer Mother   . Healthy Father     DRUG ALLERGIES:  Allergies  Allergen Reactions  . Banana Hives, Nausea And Vomiting and Rash  . Keflex [Cephalexin] Rash    No swelling- also taken penicillin without any issue.  . Onion Hives, Nausea And Vomiting and Rash  . Sulfa Antibiotics Anaphylaxis  . Grapeseed Extract [Nutritional Supplements] Itching  . Shellfish Allergy Hives    "ALL SEAFOOD"  . Grape Seed Rash    REVIEW OF SYSTEMS:   CONSTITUTIONAL: No fever, fatigue or weakness.  EYES: No blurred or double vision.  EARS, NOSE, AND THROAT: No tinnitus or ear pain.  RESPIRATORY: No cough, shortness of breath, wheezing or hemoptysis.  CARDIOVASCULAR: No chest pain, orthopnea, edema.  GASTROINTESTINAL: Positive nausea, positive vomiting, positive diarrhea or positive abdominal pain.  GENITOURINARY: No dysuria, hematuria.  ENDOCRINE: No polyuria, nocturia,  HEMATOLOGY: No anemia, easy bruising or bleeding SKIN: No rash or lesion. MUSCULOSKELETAL: No joint pain or arthritis.   NEUROLOGIC: No tingling, numbness, weakness.  PSYCHIATRY: No anxiety or depression.   MEDICATIONS AT HOME:  Prior to Admission medications   Medication Sig Start Date End Date Taking? Authorizing Provider  ALPRAZolam (XANAX) 0.25 MG tablet Take 1 tablet (0.25 mg total) by mouth 3 (three) times daily as needed for anxiety. 06/20/18  Yes Gladstone Lighter, MD  amLODipine (NORVASC) 10 MG tablet Take 1 tablet (10 mg total) by mouth daily. 06/21/18  Yes Gladstone Lighter, MD  daptomycin (CUBICIN) IVPB Inject 425 mg into the vein daily for 16 days. Indication:  MRSA Bacteremia Last Day of Therapy:  07/27/2018 Labs - Once weekly:  CBC/D, BMP, and CPK Labs - Every other week:  ESR and CRP 07/11/18 07/27/18 Yes Vaughan Basta, MD  diphenoxylate-atropine (LOMOTIL) 2.5-0.025 MG tablet Take 1 tablet by mouth 4 (four) times daily as needed for  diarrhea or loose stools. 04/11/18 04/11/19 Yes Henreitta Leber, MD  ferrous sulfate 325 (65 FE) MG tablet Take 1 tablet (325 mg total) by mouth 2 (two) times daily with a meal. 01/03/18  Yes Sainani, Belia Heman, MD  gabapentin (NEURONTIN) 300 MG capsule Take 1 capsule (300 mg total) by mouth 3 (three) times daily. 07/03/18 07/03/19 Yes Nance Pear, MD  hydrALAZINE (APRESOLINE) 25 MG tablet Take 1 tablet (25 mg total) by mouth every 8 (eight) hours. 06/20/18  Yes Gladstone Lighter, MD  insulin glargine (LANTUS) 100 UNIT/ML injection Inject 0.25 mLs (25 Units total) into the skin at bedtime. 06/20/18  Yes Gladstone Lighter, MD  Insulin Syringes, Disposable, U-100 0.3 ML MISC 1 Syringe by Does not apply route 4 (four) times daily -  with meals and at bedtime. 12/27/17  Yes Wieting, Richard, MD  NOVOLIN R RELION 100 UNIT/ML injection Inject 0-0.1 mLs (0-10 Units total) into the skin 3 (three) times daily as needed for high blood sugar (sliding scale). Patient taking differently: Inject 2 Units into the skin 3 (three) times daily as needed for high blood sugar (sliding scale). Depending on sugar level, pt will inject 5-10 units. 05/02/18 07/16/18 Yes Loletha Grayer, MD  oxyCODONE-acetaminophen [facility]) [phone]  MG tablet Take 1 tablet by mouth every 4 (four) hours as needed for pain. 07/11/18 07/11/19 Yes Vaughan Basta, MD  protein supplement shake (PREMIER PROTEIN) LIQD Take 325 mLs (11 oz total) by mouth 2 (two) times daily between meals. 06/20/18 07/20/18 Yes Gladstone Lighter, MD  vitamin C (VITAMIN C) 250 MG tablet Take 1 tablet (250 mg total) by mouth 2 (two) times daily. 06/20/18  Yes Gladstone Lighter, MD      PHYSICAL EXAMINATION:   VITAL SIGNS: Blood pressure (!) 171/105, pulse (!) 102, resp. rate 13, height '5\' 6"'$  (1.676 m), weight 52.6 kg, SpO2 99 %.  GENERAL:  33 y.o.-year-old patient lying in the bed with no acute distress.  EYES: Pupils equal, round, reactive to light and  accommodation. No scleral icterus. Extraocular muscles intact.  HEENT: Head atraumatic, normocephalic. Oropharynx and nasopharynx clear.  NECK:  Supple, no jugular venous distention. No thyroid enlargement, no tenderness.  LUNGS: Normal breath sounds bilaterally, no wheezing, rales,rhonchi or crepitation. No use of accessory muscles of respiration.  CARDIOVASCULAR: S1, S2 normal. No murmurs, rubs, or gallops.  ABDOMEN: Epigastric tenderness no guarding no rebound bowel sounds x4  eXTREMITIES: No pedal edema, cyanosis, or clubbing.  He has dressing in place in both lower extremities NEUROLOGIC: Cranial nerves II through XII are intact. Muscle strength 5/5 in all extremities. Sensation intact. Gait not checked.  PSYCHIATRIC: The patient is alert and oriented x 3.  SKIN: No obvious rash, lesion, or ulcer.   LABORATORY PANEL:   CBC Recent Labs  Lab 07/09/18 2103 07/10/18 0443 07/16/18 1059  WBC 7.3 8.0 8.5  HGB 9.8* 9.0* 9.3*  HCT 30.0* 27.9* 27.6*  PLT 305 276 315  MCV 82.4 82.8 79.8*  MCH 26.9 26.7 26.9  MCHC 32.7 32.3 33.7  RDW 14.5 14.6 14.4  LYMPHSABS 1.7  --   --   MONOABS 0.8  --   --   EOSABS 0.5  --   --   BASOSABS 0.0  --   --    ------------------------------------------------------------------------------------------------------------------  Chemistries  Recent Labs  Lab 07/09/18 2103 07/10/18 0443 07/11/18 0538 07/16/18 1059  NA 128* 134* 133* 135  K 4.7 3.7 3.9 2.9*  CL 97* 106 105 103  CO2 21* '22 22 22  '$ GLUCOSE 687* 196* 192* 205*  BUN 43* 40* 51* 39*  CREATININE 1.95* 1.68* 2.01* 1.78*  CALCIUM 8.4* 8.1* 8.3* 8.6*  AST  --   --   --  31  ALT  --   --   --  52*  ALKPHOS  --   --   --  162*  BILITOT  --   --   --  0.3   ------------------------------------------------------------------------------------------------------------------ estimated creatinine clearance is 45.1 mL/min (A) (by C-G formula based on SCr of 1.78 mg/dL  (H)). ------------------------------------------------------------------------------------------------------------------ No results for input(s): TSH, T4TOTAL, T3FREE, THYROIDAB in the last 72 hours.  Invalid input(s): FREET3   Coagulation profile No results for input(s): INR, PROTIME in the last 168 hours. ------------------------------------------------------------------------------------------------------------------- No results for input(s): DDIMER in the last 72 hours. -------------------------------------------------------------------------------------------------------------------  Cardiac Enzymes Recent Labs  Lab 07/16/18 1059  TROPONINI <0.03   ------------------------------------------------------------------------------------------------------------------ Invalid input(s): POCBNP  ---------------------------------------------------------------------------------------------------------------  Urinalysis    Component Value Date/Time   COLORURINE STRAW (A) 07/16/2018 1314   APPEARANCEUR CLEAR (A) 07/16/2018 1314   APPEARANCEUR Clear 10/06/2014 0758   LABSPEC 1.003 (L) 07/16/2018 1314   LABSPEC 1.031 10/06/2014 0758   PHURINE 5.0 07/16/2018 1314   GLUCOSEU >=500 (A) 07/16/2018 1314  GLUCOSEU >=500 10/06/2014 0758   HGBUR SMALL (A) 07/16/2018 1314   BILIRUBINUR NEGATIVE 07/16/2018 1314   BILIRUBINUR Negative 10/06/2014 0758   KETONESUR NEGATIVE 07/16/2018 1314   PROTEINUR 100 (A) 07/16/2018 1314   UROBILINOGEN 0.2 03/29/2007 1506   NITRITE NEGATIVE 07/16/2018 1314   LEUKOCYTESUR NEGATIVE 07/16/2018 1314   LEUKOCYTESUR Negative 10/06/2014 0758     RADIOLOGY: Dg Chest 2 View  Result Date: 07/16/2018 CLINICAL DATA:  Left-sided chest pain EXAM: CHEST - 2 VIEW COMPARISON:  Chest radiograph 06/14/2018 FINDINGS: Left upper extremity PICC line tip projects over the superior vena cava. Normal cardiac and mediastinal contours. No consolidative pulmonary opacities. No  pleural effusion or pneumothorax. Regional skeleton is unremarkable. IMPRESSION: No acute cardiopulmonary process. Electronically Signed   By: Lovey Newcomer M.D.   On: 07/16/2018 11:54   Ct Angio Chest Pe W And/or Wo Contrast  Result Date: 07/16/2018 CLINICAL DATA:  Two-day history of inspiratory chest pain. Elevated D-dimer. One day history of nausea, vomiting and diarrhea. Current history of chronic kidney disease, diabetes, GE reflux disease and irritable bowel syndrome. EXAM: CT ANGIOGRAPHY CHEST CT ABDOMEN AND PELVIS WITH CONTRAST TECHNIQUE: Multidetector CT imaging of the chest was performed using the standard protocol during bolus administration of intravenous contrast. Multiplanar CT image reconstructions and MIPs were obtained to evaluate the vascular anatomy. Multidetector CT imaging of the abdomen and pelvis was performed using the standard protocol during bolus administration of intravenous contrast. CONTRAST:  123m OMNIPAQUE IOHEXOL 350 MG/ML IV. COMPARISON:  CT abdomen and pelvis 01/10/2018 and earlier. No prior chest CT. FINDINGS: CTA CHEST FINDINGS Cardiovascular: Contrast opacification of pulmonary arteries is very good. No filling defects within either main pulmonary artery or their segmental branches in either lung to suggest pulmonary embolism. Normal heart size with borderline LEFT ventricular hypertrophy. No visible coronary atherosclerosis. No visible atherosclerosis involving the thoracic aorta or proximal great vessels. LEFT arm PICC tip in the LOWER SVC. Mediastinum/Nodes: No pathologic lymphadenopathy. Circumferential wall thickening involving the distal esophagus. No mediastinal mass. Normal-appearing thyroid gland. Lungs/Pleura: Pulmonary parenchyma clear without localized airspace consolidation, interstitial disease, or parenchymal nodules or masses. Central airways patent without significant bronchial wall thickening. No pleural effusions. No pleural plaques or masses.  Musculoskeletal: Regional skeleton unremarkable without acute or significant osseous abnormality. Review of the MIP images confirms the above findings. CT ABDOMEN and PELVIS FINDINGS Hepatobiliary: Liver normal in size and appearance. Gallbladder normal in appearance without calcified gallstones. No biliary ductal dilation. Pancreas: Normal in appearance without evidence of mass, ductal dilation, or inflammation. Spleen: Normal in size and appearance. Adrenals/Urinary Tract: Normal appearing adrenal glands. Kidneys normal in size and appearance without focal parenchymal abnormality. No hydronephrosis. No evidence of urinary tract calculi. Diffuse thickening of the wall of the urinary bladder with mild enhancement. Stomach/Bowel: Stomach normal in appearance for the degree of distention. Loop of small bowel anteriorly in the upper abdomen, likely a proximal jejunal loop, demonstrating mild fold thickening. Remaining small bowel unremarkable. No evidence of bowel obstruction. Completely decompressed transverse colon with mild wall thickening. Remainder of the colon normal in appearance. Normal appendix in the RIGHT UPPER pelvis anteriorly. Vascular/Lymphatic: No visible aortoiliofemoral atherosclerosis. Widely patent visceral arteries. Normal-appearing portal venous and systemic venous systems. No pathologic lymphadenopathy. Reproductive: Prostate gland and seminal vesicles normal in size and appearance for age. Other: Small amount of ascites dependently in the pelvis and in the RIGHT paracolic gutter. Musculoskeletal: Regional skeleton unremarkable without acute or significant osseous abnormality. Review of the MIP images  confirms the above findings. IMPRESSION: 1. No evidence of pulmonary embolism. 2. No acute cardiopulmonary disease. 3. Circumferential wall thickening involving the distal esophagus, likely related to the given history of GE reflux disease. 4. Localized enteritis suspected involving a loop of  proximal jejunum. 5. Possible transverse colitis, though the apparent wall thickening may be related to the fact that this segment is nearly completely collapsed. 6. Small amount of ascites dependently in the pelvis and in the RIGHT paracolic gutter. 7. Diffuse thickening of the wall of the urinary bladder, query cystitis. Electronically Signed   By: Evangeline Dakin M.D.   On: 07/16/2018 15:42   Ct Abdomen Pelvis W Contrast  Result Date: 07/16/2018 CLINICAL DATA:  Two-day history of inspiratory chest pain. Elevated D-dimer. One day history of nausea, vomiting and diarrhea. Current history of chronic kidney disease, diabetes, GE reflux disease and irritable bowel syndrome. EXAM: CT ANGIOGRAPHY CHEST CT ABDOMEN AND PELVIS WITH CONTRAST TECHNIQUE: Multidetector CT imaging of the chest was performed using the standard protocol during bolus administration of intravenous contrast. Multiplanar CT image reconstructions and MIPs were obtained to evaluate the vascular anatomy. Multidetector CT imaging of the abdomen and pelvis was performed using the standard protocol during bolus administration of intravenous contrast. CONTRAST:  11m OMNIPAQUE IOHEXOL 350 MG/ML IV. COMPARISON:  CT abdomen and pelvis 01/10/2018 and earlier. No prior chest CT. FINDINGS: CTA CHEST FINDINGS Cardiovascular: Contrast opacification of pulmonary arteries is very good. No filling defects within either main pulmonary artery or their segmental branches in either lung to suggest pulmonary embolism. Normal heart size with borderline LEFT ventricular hypertrophy. No visible coronary atherosclerosis. No visible atherosclerosis involving the thoracic aorta or proximal great vessels. LEFT arm PICC tip in the LOWER SVC. Mediastinum/Nodes: No pathologic lymphadenopathy. Circumferential wall thickening involving the distal esophagus. No mediastinal mass. Normal-appearing thyroid gland. Lungs/Pleura: Pulmonary parenchyma clear without localized airspace  consolidation, interstitial disease, or parenchymal nodules or masses. Central airways patent without significant bronchial wall thickening. No pleural effusions. No pleural plaques or masses. Musculoskeletal: Regional skeleton unremarkable without acute or significant osseous abnormality. Review of the MIP images confirms the above findings. CT ABDOMEN and PELVIS FINDINGS Hepatobiliary: Liver normal in size and appearance. Gallbladder normal in appearance without calcified gallstones. No biliary ductal dilation. Pancreas: Normal in appearance without evidence of mass, ductal dilation, or inflammation. Spleen: Normal in size and appearance. Adrenals/Urinary Tract: Normal appearing adrenal glands. Kidneys normal in size and appearance without focal parenchymal abnormality. No hydronephrosis. No evidence of urinary tract calculi. Diffuse thickening of the wall of the urinary bladder with mild enhancement. Stomach/Bowel: Stomach normal in appearance for the degree of distention. Loop of small bowel anteriorly in the upper abdomen, likely a proximal jejunal loop, demonstrating mild fold thickening. Remaining small bowel unremarkable. No evidence of bowel obstruction. Completely decompressed transverse colon with mild wall thickening. Remainder of the colon normal in appearance. Normal appendix in the RIGHT UPPER pelvis anteriorly. Vascular/Lymphatic: No visible aortoiliofemoral atherosclerosis. Widely patent visceral arteries. Normal-appearing portal venous and systemic venous systems. No pathologic lymphadenopathy. Reproductive: Prostate gland and seminal vesicles normal in size and appearance for age. Other: Small amount of ascites dependently in the pelvis and in the RIGHT paracolic gutter. Musculoskeletal: Regional skeleton unremarkable without acute or significant osseous abnormality. Review of the MIP images confirms the above findings. IMPRESSION: 1. No evidence of pulmonary embolism. 2. No acute cardiopulmonary  disease. 3. Circumferential wall thickening involving the distal esophagus, likely related to the given history of GE reflux disease.  4. Localized enteritis suspected involving a loop of proximal jejunum. 5. Possible transverse colitis, though the apparent wall thickening may be related to the fact that this segment is nearly completely collapsed. 6. Small amount of ascites dependently in the pelvis and in the RIGHT paracolic gutter. 7. Diffuse thickening of the wall of the urinary bladder, query cystitis. Electronically Signed   By: Evangeline Dakin M.D.   On: 07/16/2018 15:42    EKG: Orders placed or performed during the hospital encounter of 07/16/18  . EKG 12-Lead  . EKG 12-Lead  . ED EKG within 10 minutes  . ED EKG within 10 minutes  . EKG 12-Lead  . EKG 12-Lead    IMPRESSION AND PLAN: Patient is 33 year old with diabetes who is presenting with abdominal pain vomiting  1.  Abdominal pain with recurrent nausea vomiting we will give him IV fluids supportive care I will place patient on IV Protonix twice daily He is at high risk of having candidia esophagitis I will give patient IV fluconazole  Will ask GI to see  2.  Insulin-dependent diabetes type 1 continue Lantus as taking at home we will place him on sliding scale insulin  3.  Essential hypertension continue Norvasc and hydralazine  4.  Anxiety continue Xanax.   5.  Osteomyelitis of his lower extremity continue daptomycin as taking at home this is according to recent discharge summary patient when asked she says is taking vancomycin will confirm this.    All the records are reviewed and case discussed with ED provider. Management plans discussed with the patient, family and they are in agreement.  CODE STATUS: Code Status History    Date Active Date Inactive Code Status Order ID Comments User Context   07/10/2018 0111 07/11/2018 1955 Full Code [id number]  Gorden Harms, MD Inpatient   06/14/2018 1744 06/20/2018 2123 Full  Code [id number]  Loletha Grayer, MD ED   05/28/2018 0437 05/28/2018 1926 Full Code [id number]  Arta Silence, MD ED   04/27/2018 1726 05/02/2018 1713 Full Code [id number]  Henreitta Leber, MD Inpatient   04/05/2018 1724 04/11/2018 2142 Full Code [id number]  Dustin Flock, MD Inpatient   02/06/2018 2022 02/07/2018 1512 Full Code [id number]  Demetrios Loll, MD Inpatient   01/10/2018 2259 01/11/2018 1947 Full Code [id number]  Amelia Jo, MD Inpatient   12/30/2017 0019 01/03/2018 1457 Full Code [id number]  Harrie Foreman, MD Inpatient   12/20/2017 1541 12/27/2017 2147 Full Code [id number]  Sharlotte Alamo, [facility] Inpatient   11/10/2017 0049 11/11/2017 1632 Full Code [id number]  Amelia Jo, MD Inpatient   07/13/2017 2112 07/15/2017 1730 Full Code [id number]  Jules Husbands, MD ED       TOTAL TIME TAKING CARE OF THIS PATIENT: 55 minutes.    Dustin Flock M.D on 07/16/2018 at 4:29 PM  Between 7am to 6pm - Pager - [phone]  After 6pm go to [email or web address] - password [email or web address] [facility]  [phone]  CC: Primary care physician; Valera Castle, MD

## 2018-07-16 NOTE — ED Notes (Signed)
BC x1 sent with urine and flu swab pt is aware of stool specimen.

## 2018-07-16 NOTE — ED Triage Notes (Signed)
Chest pain x2 days worse pain with deep breaths,  N/V/D x1 day.  PICC line present to left upper arm.  June and October 2019 procedures to bilateral feet with amputations.

## 2018-07-16 NOTE — ED Provider Notes (Signed)
-----------------------------------------   4:02 PM on 07/16/2018 -----------------------------------------  At this time patient complains of continued epigastric pain nausea.  He vomited about half an hour ago last diarrhea was an hour and a half ago.  He is orthostatic sitting pulse is 111 blood pressure is 144/110 laying pulse is 110 blood pressure is 171/105 he is feeling a little lightheaded.  He looks puny.  He is asking for more nausea medicine.  I will give him some more nausea medicine is more fluid CT of the chest shows no acute disease CT of the abdomen shows some distal esophageal thickening and possible transfer shows colitis and possible localized inflammation of the small intestine.  Patient's lipase is minimally elevated as well.  I will see if possibly we can observe this gentleman overnight.   Nena Polio, MD 07/16/18 [phone]

## 2018-07-16 NOTE — ED Provider Notes (Signed)
[facility] Emergency Department Provider Note  ____________________________________________  Time seen: Approximately 12:29 PM  I have reviewed the triage vital signs and the nursing notes.   HISTORY  Chief Complaint Chest Pain    HPI Shawn [name] is a 33 y.o. male with type 1 diabetes, currently on PICC delivered vancomycin for bilateral foot osteomyelitis, recent hospitalization for pick infection discharged 07/11/2017 presenting with 2 to 3 days of nausea vomiting and diarrhea, chest pain.  The patient reports that he has been having multiple daily episodes of nausea vomiting and loose nonbloody stool.  He denies any cough or cold symptoms and has had his influenza vaccination this year.  He also reports an intermittent central and left-sided chest tightness that is not associated with exertion and is nonpleuritic.  He has no associated shortness of breath, palpitations, lightheadedness or syncope.  He is nonambulatory due to his foot infections.  Past Medical History:  Diagnosis Date  . CKD (chronic kidney disease)   . Diabetes mellitus without complication ([place])   . GERD (gastroesophageal reflux disease)   . IBS (irritable bowel syndrome)   . Osteomyelitis [facility])     Patient Active Problem List   Diagnosis Date Noted  . IV infusion line dysfunction ([place]) 07/09/2018  . Hyperglycemic hyperosmolar nonketotic coma ([place]) 05/28/2018  . Acute renal failure (ARF) ([place]) 04/27/2018  . Left foot infection 04/05/2018  . Foot infection 02/06/2018  . Osteomyelitis ([place]) 01/10/2018  . Cellulitis 12/29/2017  . Sepsis ([place]) 12/18/2017  . Moderate recurrent major depression ([place]) 11/10/2017  . Type 2 diabetes mellitus with hyperosmolar nonketotic hyperglycemia ([place]) 11/09/2017  . History of migraine 07/15/2017  . IBS (irritable bowel syndrome) 07/15/2017  . Protein-calorie malnutrition, severe 07/14/2017  . Abdominal pain 07/13/2017  . GERD (gastroesophageal  reflux disease) 12/30/2009  . Diabetes ([place]) 11/25/2009  . MDD (major depressive disorder) 11/25/2009  . Hypercholesterolemia 03/07/2008    Past Surgical History:  Procedure Laterality Date  . ABDOMINAL AORTOGRAM W/LOWER EXTREMITY Right 12/23/2017   Procedure: ABDOMINAL AORTOGRAM W/LOWER EXTREMITY;  Surgeon: Katha Cabal, MD;  Location: [place] CV LAB;  Service: Cardiovascular;  Laterality: Right;  . ACHILLES TENDON SURGERY Bilateral 06/16/2018   Procedure: ACHILLES LENGTHENING/KIDNER;  Surgeon: Albertine Patricia, DPM;  Location: [facility] ORS;  Service: Podiatry;  Laterality: Bilateral;  . AMPUTATION Right 12/24/2017   Procedure: AMPUTATION RAY;  Surgeon: Sharlotte Alamo, DPM;  Location: [facility] ORS;  Service: Podiatry;  Laterality: Right;  . AMPUTATION Left 04/06/2018   Procedure: AMPUTATION RAY;  Surgeon: Sharlotte Alamo, DPM;  Location: [facility] ORS;  Service: Podiatry;  Laterality: Left;  . AMPUTATION Left 04/09/2018   Procedure: AMPUTATION RAY;  Surgeon: Sharlotte Alamo, DPM;  Location: [facility] ORS;  Service: Podiatry;  Laterality: Left;  . APPLICATION OF WOUND VAC Right 12/24/2017   Procedure: APPLICATION OF WOUND VAC;  Surgeon: Sharlotte Alamo, DPM;  Location: [facility] OR[facility];  Service: Podiatry;  Laterality: Right;  . APPLICATION OF WOUND VAC Right 01/01/2018   Procedure: APPLICATION OF WOUND VAC;  Surgeon: Albertine Patricia, DPM;  Location: [facility] ORS;  Service: Podiatry;  Laterality: Right;  . BONE EXCISION Bilateral 06/16/2018   Procedure: BONE EXCISION AND SOFT TISSUE;  Surgeon: Albertine Patricia, DPM;  Location: [facility] ORS;  Service: Podiatry;  Laterality: Bilateral;  . IRRIGATION AND DEBRIDEMENT FOOT Right 12/20/2017   Procedure: IRRIGATION AND DEBRIDEMENT FOOT;  Surgeon: Sharlotte Alamo, DPM;  Location: [facility] ORS;  Service: Podiatry;  Laterality: Right;  . IRRIGATION AND DEBRIDEMENT FOOT Right 12/24/2017  Procedure: IRRIGATION AND DEBRIDEMENT FOOT;  Surgeon: Sharlotte Alamo, DPM;  Location: [facility] OR[facility];  Service: Podiatry;   Laterality: Right;  . IRRIGATION AND DEBRIDEMENT FOOT Right 01/01/2018   Procedure: IRRIGATION AND DEBRIDEMENT FOOT-SKIN,SOFT TISSUE AND BONE;  Surgeon: Albertine Patricia, DPM;  Location: [facility] OR[facility];  Service: Podiatry;  Laterality: Right;  . IRRIGATION AND DEBRIDEMENT FOOT Left 04/06/2018   Procedure: IRRIGATION AND DEBRIDEMENT FOOT;  Surgeon: Sharlotte Alamo, DPM;  Location: [facility] OR[facility];  Service: Podiatry;  Laterality: Left;  . LOWER EXTREMITY ANGIOGRAPHY Left 04/06/2018   Procedure: Lower Extremity Angiography;  Surgeon: Algernon Huxley, MD;  Location: [place] [facility] LAB;  Service: Cardiovascular;  Laterality: Left;    Current Outpatient Rx  . Order #: [id number] Class: Print  . Order #: [id number] Class: Normal  . Order #: [id number] Class: Print  . Order #: [id number] Class: Print  . Order #: [id number] Class: Print  . Order #: [id number] Class: Normal  . Order #: [id number] Class: Normal  . Order #: [id number] Class: No Print  . Order #: [id number] Class: Normal  . Order #: [id number] Class: Normal  . Order #: [id number] Class: Print  . Order #: [id number] Class: Print  . Order #: [id number] Class: Normal    Allergies Banana; Keflex [cephalexin]; Onion; Sulfa antibiotics; Grapeseed extract [nutritional supplements]; Shellfish allergy; and Grape seed  Family History  Problem Relation Age of Onset  . Diabetes Mother   . Ovarian cancer Mother   . Healthy Father     Social History Social History   Tobacco Use  . Smoking status: Never Smoker  . Smokeless tobacco: Never Used  Substance Use Topics  . Alcohol use: No    Frequency: Never  . Drug use: Yes    Types: Marijuana, PCP    Review of Systems Constitutional: No fever/chills.  No lightheadedness or syncope.  Positive general malaise. Eyes: No visual changes. ENT: No sore throat. No congestion or rhinorrhea. Cardiovascular: Denies chest pain. Denies palpitations. Respiratory: Denies shortness of breath.  No cough. Gastrointestinal: No abdominal  pain.  Positive nausea, positive vomiting.  Positive diarrhea.  No constipation. Genitourinary: Negative for dysuria. Musculoskeletal: Negative for back pain.  Positive bilateral lower extremity osteomyelitis with chronic unchanged pain. Skin: Negative for rash. Neurological: Negative for headaches. No focal numbness, tingling or weakness.     ____________________________________________   PHYSICAL EXAM:  VITAL SIGNS: ED Triage Vitals  Enc Vitals Group     BP --      Pulse --      Resp --      Temp --      Temp src --      SpO2 07/16/18 1123 100 %     Weight 07/16/18 1047 116 lb (52.6 kg)     Height 07/16/18 1047 5\' 6"  (1.676 m)     Head Circumference --      Peak Flow --      Pain Score 07/16/18 1047 10     Pain Loc --      Pain Edu? --      Excl. in [place]? --     Constitutional: Alert and oriented.  Answers questions appropriately.  Chronically ill-appearing. Eyes: Conjunctivae are normal.  EOMI. No scleral icterus. Head: Atraumatic. Nose: No congestion/rhinnorhea. Mouth/Throat: Mucous membranes are moist.  Poor dentition diffusely. Neck: No stridor.  Supple.  No meningismus. Cardiovascular: Normal rate, regular rhythm. No murmurs, rubs or gallops.  Respiratory: Normal respiratory effort.  No accessory muscle use or retractions. Lungs CTAB.  No wheezes, rales or ronchi. Gastrointestinal: Soft, and nondistended.  Tender to palpation diffusely of the abdomen without focality.  Negative Murphy sign.  No guarding or rebound.  No peritoneal signs. Musculoskeletal: Boots on the bilateral feet; we will remove these and evaluate his legs with the nurse. Neurologic:  A&Ox3.  Speech is clear.  Face and smile are symmetric.  EOMI.  Moves all extremities well. Skin:  Skin is warm, dry. Psychiatric: Mood and affect are normal. Speech and behavior are normal.  Normal judgement  ____________________________________________   LABS (all labs ordered are listed, but only abnormal  results are displayed)  Labs Reviewed  BASIC METABOLIC PANEL - Abnormal; Notable for the following components:      Result Value   Potassium 2.9 (*)    Glucose, Bld 205 (*)    BUN 39 (*)    Creatinine, Ser 1.78 (*)    Calcium 8.6 (*)    GFR calc non Af Amer 50 (*)    GFR calc Af Amer 58 (*)    All other components within normal limits  CBC - Abnormal; Notable for the following components:   RBC 3.46 (*)    Hemoglobin 9.3 (*)    HCT 27.6 (*)    MCV 79.8 (*)    All other components within normal limits  CULTURE, BLOOD (ROUTINE X 2)  CULTURE, BLOOD (ROUTINE X 2)  TROPONIN I  LIPASE, BLOOD  URINALYSIS, COMPLETE (UACMP) WITH MICROSCOPIC  INFLUENZA PANEL BY PCR (TYPE A & B)  HEPATIC FUNCTION PANEL   ____________________________________________  EKG  ED ECG REPORT I, Anne-Caroline Mariea Clonts, the attending physician, personally viewed and interpreted this ECG.   Date: 07/16/2018  EKG Time: 1046  Rate: 103  Rhythm: sinus tachycardia  Axis: normal  Intervals:none  ST&T Change: No STEMI  ____________________________________________  RADIOLOGY  Dg Chest 2 View  Result Date: 07/16/2018 CLINICAL DATA:  Left-sided chest pain EXAM: CHEST - 2 VIEW COMPARISON:  Chest radiograph 06/14/2018 FINDINGS: Left upper extremity PICC line tip projects over the superior vena cava. Normal cardiac and mediastinal contours. No consolidative pulmonary opacities. No pleural effusion or pneumothorax. Regional skeleton is unremarkable. IMPRESSION: No acute cardiopulmonary process. Electronically Signed   By: Lovey Newcomer M.D.   On: 07/16/2018 11:54    ____________________________________________   PROCEDURES  Procedure(s) performed: None  Procedures  Critical Care performed: No ____________________________________________   INITIAL IMPRESSION / ASSESSMENT AND PLAN / ED COURSE  Pertinent labs & imaging results that were available during my care of the patient were reviewed by me and  considered in my medical decision making (see chart for details).  33 y.o. male with multiple medical problems, including active osteomyelitis being treated with vancomycin, nonambulatory, presenting with nausea vomiting and diarrhea, as well as chest pain.  Overall, the patient does have tachycardia today.  Given that he is nonambulatory I am concerned about PE and he will undergo CT imaging.  ACS or MI is less likely but get a troponin.  His nausea vomiting and diarrhea may be due to a viral or foodborne GI illness, I would also consider influenza and we are testing for that.  We will also get basic laboratory studies.  UA is pending as well.  Plan reevaluation for final disposition.  ----------------------------------------- 12:34 PM on 07/16/2018 ----------------------------------------- The patient is hyperglycemic with a blood glucose of 205 today but a normal anion gap at 10.  He is mildly hypokalemic, and I will give him a small dose of potassium at this time.  He has renal insufficiency which is  grossly unchanged if somewhat better than his creatinine of 2.01 on one 01/2019.  He is receiving intravenous fluids at this time.  The patient has a normal white blood cell count and his anemia stable.  His first troponin is negative and a second troponin is pending.  His chest x-ray does not show any acute cardiopulmonary process.  The CT of his chest is pending.  I also have a hepatic function panel that is pending.  Plan reevaluation for final disposition.   ____________________________________________  FINAL CLINICAL IMPRESSION(S) / ED DIAGNOSES  Final diagnoses:  None         NEW MEDICATIONS STARTED DURING THIS VISIT:  New Prescriptions   No medications on file      Eula Listen, MD 07/16/18 1236

## 2018-07-17 ENCOUNTER — Ambulatory Visit: Payer: Medicaid Other | Admitting: Nurse Practitioner

## 2018-07-17 DIAGNOSIS — R112 Nausea with vomiting, unspecified: Secondary | ICD-10-CM | POA: Diagnosis not present

## 2018-07-17 LAB — CK: Total CK: 128 U/L (ref 49–397)

## 2018-07-17 LAB — BASIC METABOLIC PANEL
ANION GAP: 6 (ref 5–15)
BUN: 32 mg/dL — ABNORMAL HIGH (ref 6–20)
CO2: 25 mmol/L (ref 22–32)
Calcium: 8.3 mg/dL — ABNORMAL LOW (ref 8.9–10.3)
Chloride: 108 mmol/L (ref 98–111)
Creatinine, Ser: 1.62 mg/dL — ABNORMAL HIGH (ref 0.61–1.24)
GFR calc Af Amer: >60 mL/min (ref >60)
GFR calc non Af Amer: 56 mL/min — ABNORMAL LOW (ref >60)
Glucose, Bld: 62 mg/dL — ABNORMAL LOW (ref 70–99)
Potassium: 3.3 mmol/L — ABNORMAL LOW (ref 3.5–5.1)
Sodium: 139 mmol/L (ref 135–145)

## 2018-07-17 LAB — GLUCOSE, CAPILLARY
GLUCOSE-CAPILLARY: 171 mg/dL — AB (ref 70–99)
Glucose-Capillary: 61 mg/dL — ABNORMAL LOW (ref 70–99)
Glucose-Capillary: 65 mg/dL — ABNORMAL LOW (ref 70–99)
Glucose-Capillary: 64 mg/dL — ABNORMAL LOW (ref 70–99)
Glucose-Capillary: 165 mg/dL — ABNORMAL HIGH (ref 70–99)
Glucose-Capillary: 133 mg/dL — ABNORMAL HIGH (ref 70–99)

## 2018-07-17 LAB — CBC
HCT: 28.8 % — ABNORMAL LOW (ref 39.0–52.0)
Hemoglobin: 9.5 g/dL — ABNORMAL LOW (ref 13.0–17.0)
MCH: 26.6 pg (ref 26.0–34.0)
MCHC: 33 g/dL (ref 30.0–36.0)
MCV: 80.7 fL (ref 80.0–100.0)
Platelets: 347 10*3/uL (ref 150–400)
RBC: 3.57 MIL/uL — ABNORMAL LOW (ref 4.22–5.81)
RDW: 14.9 % (ref 11.5–15.5)
WBC: 8.9 10*3/uL (ref 4.0–10.5)
nRBC: 0 % (ref 0.0–0.2)

## 2018-07-17 LAB — MAGNESIUM: Magnesium: 1.9 mg/dL (ref 1.7–2.4)

## 2018-07-17 MED ORDER — MORPHINE SULFATE (PF) 2 MG/ML IV SOLN
2.0000 mg | INTRAVENOUS | Status: DC | PRN
  Administered 2018-07-17 (×2): 2 mg via INTRAVENOUS
  Filled 2018-07-17 (×2): qty 1

## 2018-07-17 MED ORDER — HYDRALAZINE HCL 20 MG/ML IJ SOLN: 10.0000 mg | Freq: Four times a day (QID) | INTRAMUSCULAR | Status: DC | PRN

## 2018-07-17 MED ORDER — INSULIN GLARGINE 100 UNIT/ML ~~LOC~~ SOLN
24.0000 [IU] | Freq: Every day | SUBCUTANEOUS | Status: DC
  Filled 2018-07-17: qty 0.24

## 2018-07-17 MED ORDER — PANTOPRAZOLE SODIUM 40 MG PO TBEC
40.0000 mg | DELAYED_RELEASE_TABLET | Freq: Two times a day (BID) | ORAL | Status: DC
  Administered 2018-07-17: 17:00:00 40 mg via ORAL
  Filled 2018-07-17: qty 1

## 2018-07-17 MED ORDER — MORPHINE SULFATE (PF) 4 MG/ML IV SOLN: 4.0000 mg | INTRAVENOUS | Status: DC | PRN

## 2018-07-17 MED ORDER — PANTOPRAZOLE SODIUM 40 MG PO TBEC: 40.0000 mg | DELAYED_RELEASE_TABLET | Freq: Two times a day (BID) | ORAL | 0 refills | Status: DC

## 2018-07-17 MED ORDER — POTASSIUM CHLORIDE CRYS ER 20 MEQ PO TBCR
40.0000 meq | EXTENDED_RELEASE_TABLET | Freq: Once | ORAL | Status: AC
  Administered 2018-07-17: 40 meq via ORAL
  Filled 2018-07-17: qty 2

## 2018-07-17 NOTE — Discharge Summary (Signed)
[facility] [place] NAME: Shawn [name]    MR#:  [id number]  DATE OF BIRTH:  14-Oct-1985  DATE OF ADMISSION:  07/16/2018   ADMITTING PHYSICIAN: Dustin Flock, MD  DATE OF DISCHARGE: 07/17/2018  PRIMARY CARE PHYSICIAN: Valera Castle, MD   ADMISSION DIAGNOSIS:  Colitis [K52.9] Nausea vomiting and diarrhea [R11.2, R19.7] Abdominal pain, unspecified abdominal location [R10.9] DISCHARGE DIAGNOSIS:  Active Problems:   Nausea & vomiting  SECONDARY DIAGNOSIS:   Past Medical History:  Diagnosis Date  . CKD (chronic kidney disease)   . Diabetes mellitus without complication ([place])   . GERD (gastroesophageal reflux disease)   . IBS (irritable bowel syndrome)   . Osteomyelitis [facility])    HOSPITAL COURSE:  Patient is 33 year old with diabetes who is presenting with abdominal pain vomiting  1.  Abdominal pain with recurrent nausea vomiting, possible due to gastroparesis He is treated with fluids supportive care, IV Protonix twice daily Since he is at high risk of having candidia esophagitis, he is given IV fluconazole. Dr. Vicente Males, 1. BID PPI 2. Keep head end of the bed elevated at all times 3. Low fat and fiber meals 4. Limit narcotics Outpatient follow up with GI to establish care- will need EGD as an outpatient.  2.  Insulin-dependent diabetes type 1, on Lantus and sliding scale insulin  3.  Essential hypertension continue Norvasc and hydralazine  4.  Anxiety continue Xanax.   5.  Osteomyelitis of his lower extremity continue daptomycin as taking at home.  Hypokalemia.  Given potassium supplement, follow-up level as outpatient.  Magnesium is normal. CKD stage III.  Stable.  DISCHARGE CONDITIONS:  Stable, discharge to home today. CONSULTS OBTAINED:   DRUG ALLERGIES:   Allergies  Allergen Reactions  . Banana Hives, Nausea And Vomiting and Rash  . Keflex [Cephalexin] Rash    No swelling- also taken penicillin  without any issue.  . Onion Hives, Nausea And Vomiting and Rash  . Sulfa Antibiotics Anaphylaxis  . Grapeseed Extract [Nutritional Supplements] Itching  . Shellfish Allergy Hives    "ALL SEAFOOD"  . Grape Seed Rash   DISCHARGE MEDICATIONS:   Allergies as of 07/17/2018      Reactions   Banana Hives, Nausea And Vomiting, Rash   Keflex [cephalexin] Rash   No swelling- also taken penicillin without any issue.   Onion Hives, Nausea And Vomiting, Rash   Sulfa Antibiotics Anaphylaxis   Grapeseed Extract [nutritional Supplements] Itching   Shellfish Allergy Hives   "ALL SEAFOOD"   Grape Seed Rash      Medication List    TAKE these medications   ALPRAZolam 0.25 MG tablet Commonly known as:  XANAX Take 1 tablet (0.25 mg total) by mouth 3 (three) times daily as needed for anxiety.   amLODipine 10 MG tablet Commonly known as:  NORVASC Take 1 tablet (10 mg total) by mouth daily.   ascorbic acid 250 MG tablet Commonly known as:  VITAMIN C Take 1 tablet (250 mg total) by mouth 2 (two) times daily.   daptomycin  IVPB Commonly known as:  CUBICIN Inject 425 mg into the vein daily for 16 days. Indication:  MRSA Bacteremia Last Day of Therapy:  07/27/2018 Labs - Once weekly:  CBC/D, BMP, and CPK Labs - Every other week:  ESR and CRP   diphenoxylate-atropine 2.5-0.025 MG tablet Commonly known as:  LOMOTIL Take 1 tablet by mouth 4 (four) times daily as needed for diarrhea or loose stools.  ferrous sulfate 325 (65 FE) MG tablet Take 1 tablet (325 mg total) by mouth 2 (two) times daily with a meal.   gabapentin 300 MG capsule Commonly known as:  NEURONTIN Take 1 capsule (300 mg total) by mouth 3 (three) times daily.   hydrALAZINE 25 MG tablet Commonly known as:  APRESOLINE Take 1 tablet (25 mg total) by mouth every 8 (eight) hours.   insulin glargine 100 UNIT/ML injection Commonly known as:  LANTUS Inject 0.25 mLs (25 Units total) into the skin at bedtime.   Insulin Syringes  (Disposable) U-100 0.3 ML Misc 1 Syringe by Does not apply route 4 (four) times daily -  with meals and at bedtime.   NOVOLIN R RELION 100 units/mL injection Generic drug:  insulin regular Inject 0-0.1 mLs (0-10 Units total) into the skin 3 (three) times daily as needed for high blood sugar (sliding scale). What changed:    how much to take  additional instructions   oxyCODONE-acetaminophen 10-325 MG tablet Commonly known as:  PERCOCET Take 1 tablet by mouth every 4 (four) hours as needed for pain.   pantoprazole 40 MG tablet Commonly known as:  PROTONIX Take 1 tablet (40 mg total) by mouth 2 (two) times daily before a meal.   protein supplement shake Liqd Commonly known as:  PREMIER PROTEIN Take 325 mLs (11 oz total) by mouth 2 (two) times daily between meals.        DISCHARGE INSTRUCTIONS:  See AVS. If you experience worsening of your admission symptoms, develop shortness of breath, life threatening emergency, suicidal or homicidal thoughts you must seek medical attention immediately by calling 911 or calling your MD immediately  if symptoms less severe.  You Must read complete instructions/literature along with all the possible adverse reactions/side effects for all the Medicines you take and that have been prescribed to you. Take any new Medicines after you have completely understood and accpet all the possible adverse reactions/side effects.   Please note  You were cared for by a hospitalist during your hospital stay. If you have any questions about your discharge medications or the care you received while you were in the hospital after you are discharged, you can call the unit and asked to speak with the hospitalist on call if the hospitalist that took care of you is not available. Once you are discharged, your primary care physician will handle any further medical issues. Please note that NO REFILLS for any discharge medications will be authorized once you are discharged,  as it is imperative that you return to your primary care physician (or establish a relationship with a primary care physician if you do not have one) for your aftercare needs so that they can reassess your need for medications and monitor your lab values.    On the day of Discharge:  VITAL SIGNS:  Blood pressure (!) 128/93, pulse 98, temperature 97.7 F (36.5 C), temperature source Oral, resp. rate 16, height '5\' 6"'$  (1.676 m), weight 52.6 kg, SpO2 100 %. PHYSICAL EXAMINATION:  GENERAL:  33 y.o.-year-old patient lying in the bed with no acute distress.  EYES: Pupils equal, round, reactive to light and accommodation. No scleral icterus. Extraocular muscles intact.  HEENT: Head atraumatic, normocephalic. Oropharynx and nasopharynx clear.  NECK:  Supple, no jugular venous distention. No thyroid enlargement, no tenderness.  LUNGS: Normal breath sounds bilaterally, no wheezing, rales,rhonchi or crepitation. No use of accessory muscles of respiration.  CARDIOVASCULAR: S1, S2 normal. No murmurs, rubs, or gallops.  ABDOMEN:  Soft, non-tender, non-distended. Bowel sounds present. No organomegaly or mass.  EXTREMITIES: No pedal edema, cyanosis, or clubbing.  Bilateral foot in dressing. NEUROLOGIC: Cranial nerves II through XII are intact. Muscle strength 5/5 in all extremities. Sensation intact. Gait not checked.  PSYCHIATRIC: The patient is alert and oriented x 3.  SKIN: No obvious rash, lesion, or ulcer.  DATA REVIEW:   CBC Recent Labs  Lab 07/17/18 0518  WBC 8.9  HGB 9.5*  HCT 28.8*  PLT 347    Chemistries  Recent Labs  Lab 07/16/18 1059 07/17/18 0518  NA 135 139  K 2.9* 3.3*  CL 103 108  CO2 22 25  GLUCOSE 205* 62*  BUN 39* 32*  CREATININE 1.78* 1.62*  CALCIUM 8.6* 8.3*  MG  --  1.9  AST 31  --   ALT 52*  --   ALKPHOS 162*  --   BILITOT 0.3  --      Microbiology Results  Results for orders placed or performed during the hospital encounter of 07/16/18  Blood culture  (routine x 2)     Status: None (Preliminary result)   Collection Time: 07/16/18  1:14 PM  Result Value Ref Range Status   Specimen Description BLOOD LAC  Final   Special Requests   Final    BOTTLES DRAWN AEROBIC AND ANAEROBIC Blood Culture results may not be optimal due to an excessive volume of blood received in culture bottles   Culture   Final    NO GROWTH < 24 HOURS Performed at [facility], [address], [place], [place] [address]    Report Status PENDING  Incomplete    RADIOLOGY:  No results found.   Management plans discussed with the patient, family and they are in agreement.  CODE STATUS: Full Code   TOTAL TIME TAKING CARE OF THIS PATIENT: 32 minutes.    Demetrios Loll M.D on 07/17/2018 at 4:23 PM  Between 7am to 6pm - Pager - [phone]  After 6pm go to [email or web address] - Proofreader  [facility]  Office  [phone]  CC: Primary care physician; Valera Castle, MD   Note: This dictation was prepared with Dragon dictation along with smaller phrase technology. Any transcriptional errors that result from this process are unintentional.

## 2018-07-17 NOTE — Progress Notes (Signed)
Inpatient Diabetes Program Recommendations  AACE/ADA: New Consensus Statement on Inpatient Glycemic Control   Target Ranges:  Prepandial:   less than 140 mg/dL      Peak postprandial:   less than 180 mg/dL (1-2 hours)      Critically ill patients:  140 - 180 mg/dL   Results for Shawn [name], Shawn [name] (MRN [id number]) as of 07/17/2018 08:29  Ref. Range 07/11/2018 07:36 07/11/2018 11:49 07/16/2018 17:52 07/16/2018 21:12 07/17/2018 06:26 07/17/2018 08:12  Glucose-Capillary Latest Ref Range: 70 - 99 mg/dL 229 (H) 243 (H) 215 (H) 223 (H) 61 (L) 64 (L)   Review of Glycemic Control  Diabetes history:DM1 (makes NO insulin; requires basal, correction, and meal coverage insulin) Outpatient Diabetes medications:Lantus 25 units QHS, Regular2 units with meals, plus additional units if glucose is elevated TID Current orders for Inpatient glycemic control:Lantus 25 units QHS, Novolog 0-9 units TID with meals  Inpatient Diabetes Program Recommendations:  Insulin - Basal: Please consider decreasing Lantus to 24 units QHS. Insulin - Meal Coverage: Once diet is advanced, please consider ordering Novolog 3 units TID with meals for meal coverage if patient eats at least 50% of meals. Insulin-Correction: Please consider ordering Novolog 0-5 units QHS for bedtime correction.  Thanks, [name], RN, MSN, CDE Diabetes Coordinator Inpatient Diabetes Program [phone] (Team Pager from 8am to 5pm)

## 2018-07-17 NOTE — Discharge Instructions (Signed)
1. BID PPI 2. Keep head end of the bed elevated at all times 3. Low fat and fiber meals 4. Limit narcotics

## 2018-07-17 NOTE — Progress Notes (Signed)
Received MD order to discharge patient to home, reviewed home meds, discharge instructions, prescriptions and follow up appointments with patient and patient verbalized understanding.  Patient in discharged with left arm PICC in, he came to the hospital with it and is getting IV antibiotics at home.

## 2018-07-17 NOTE — Progress Notes (Signed)
Glucose was 65 at 06:45 reckeck

## 2018-07-17 NOTE — Care Management Note (Addendum)
Case Management Note  Patient Details  Name: Shawn [name] MRN: [id number] Date of Birth: 08/17/1985  Subjective/Objective:    Admitted to [facility] under observation status with the diagnosis of nausea/vomitting. Discharged from this facility 07/11/18.  Lives with parent. Sees Dr. Kym Groom as primary care physician. Possible discharge later today after GI consult.                  Action/Plan: PICC line placed l;ast admission. Receiving Daptomycin 425mg  IV every 24 hours. Followed by [facility] in the home.    Expected Discharge Date:  07/17/18               Expected Discharge Plan:     In-House Referral:   yes  Discharge planning Services   yes  Post Acute Care Choice:    Choice offered to:     DME Arranged:    DME Agency:     HH Arranged:    HH Agency:     Status of Service:     If discussed at [employer], dates discussed:    Additional Comments:  [name], RN MSN CCM Care Management [phone] 07/17/2018, 12:49 PM

## 2018-07-17 NOTE — Consult Note (Signed)
[place] Nurse wound consult note Reason for Consult: Bilateral lateral foot ulcerations in end stages of tissue repair and regeneration.  Ulcers are nearly reepithelialized, right is closer to closure than left. Patient was to see Dr. Elvina Mattes in his office tomorrow. They are not the reason for his admission. Wound type: Neuropathic Pressure Injury POA: N/A Measurement: Right lateral foot 90% reepithelialized. Open area measures 1.8cm x 0.4cm x 0.1cmwith pink, minimally moist wound bed, no drainage. Left lateral foot is 75% reepithelialized. Open area measures 3cm x 0.5cm x 0.2cm, pink, minimally moist wound bed.  No exudate. Wound bed:As described above Drainage (amount, consistency, odor) As described above Periwound: intact, dry. Evidence of previous wound healing is noted. Dressing procedure/placement/frequency: I will continue to the daily saline dressings covered with dry gauze and secured with Kerlix roll gauze and paper tape until patient is seen again by Dr. Elvina Mattes. Patient and his father are doing the dressing changes 5x/week and a visiting RN from [facility] is doing the dressing change twice weekly while assessing for progress and signs and symptoms of infection, glycemic control. Patient is anxious to shower, I have indicated that he should speak to Dr. Elvina Mattes about that. He will require orthotics for offloading of these areas post healing.  [place] nursing team will not follow, but will remain available to this patient, the nursing and medical teams.  Please re-consult if needed. Thanks, Maudie Flakes, MSN, RN, [place], Arther Abbott  Pager# [phone]

## 2018-07-17 NOTE — Consult Note (Signed)
Shawn [name] , MD [address] [place], [place], [place], [address] [address], [place], [place], [place], [address] Phone: [phone]  Fax: [phone]  Consultation  Referring Provider: DR Bridgett Larsson  Primary Care Physician:  Valera Castle, MD Primary Gastroenterologist:  None          Reason for Consultation:     Abdominal pain  Date of Admission:  07/16/2018 Date of Consultation:  07/17/2018         HPI:   Shawn [name] is a 33 y.o. male admitted with chest pain. I have been consulted for nausea, vomiting and abdominal pain. He is being treated for Osteomyelitis with IV antibiotics.   On admission had  A potassium of 2.9 . A CT scan was performed yesterday which showed circumferential wall thickening of the distal esophagus , enteritis affecting jejunum , possible transverse colitis. Possible cystitis. Urine analysis shows no bacteria.   Discharged on 07/11/2018 with diabetic food,osteomyelitis.   He says he has had some vomiting for a few days and has stopped yesterday, suffers from gastroparesis and says he is always nauseous. He did have some diarrhea which he has had none since a day . No other complaints. When I went into his room he had drank 3 cans of diet soda,.   Past Medical History:  Diagnosis Date  . CKD (chronic kidney disease)   . Diabetes mellitus without complication ([place])   . GERD (gastroesophageal reflux disease)   . IBS (irritable bowel syndrome)   . Osteomyelitis [facility])     Past Surgical History:  Procedure Laterality Date  . ABDOMINAL AORTOGRAM W/LOWER EXTREMITY Right 12/23/2017   Procedure: ABDOMINAL AORTOGRAM W/LOWER EXTREMITY;  Surgeon: Katha Cabal, MD;  Location: [place] [facility];  Service: Cardiovascular;  Laterality: Right;  . ACHILLES TENDON SURGERY Bilateral 06/16/2018   Procedure: ACHILLES LENGTHENING/KIDNER;  Surgeon: Albertine Patricia, DPM;  Location: [facility] ORS;  Service: Podiatry;  Laterality: Bilateral;  . AMPUTATION  Right 12/24/2017   Procedure: AMPUTATION RAY;  Surgeon: Sharlotte Alamo, DPM;  Location: [facility];  Service: Podiatry;  Laterality: Right;  . AMPUTATION Left 04/06/2018   Procedure: AMPUTATION RAY;  Surgeon: Sharlotte Alamo, DPM;  Location: [facility];  Service: Podiatry;  Laterality: Left;  . AMPUTATION Left 04/09/2018   Procedure: AMPUTATION RAY;  Surgeon: Sharlotte Alamo, DPM;  Location: [facility];  Service: Podiatry;  Laterality: Left;  . APPLICATION OF WOUND VAC Right 12/24/2017   Procedure: APPLICATION OF WOUND VAC;  Surgeon: Sharlotte Alamo, DPM;  Location: [facility];  Service: Podiatry;  Laterality: Right;  . APPLICATION OF WOUND VAC Right 01/01/2018   Procedure: APPLICATION OF WOUND VAC;  Surgeon: Albertine Patricia, DPM;  Location: [facility];  Service: Podiatry;  Laterality: Right;  . BONE EXCISION Bilateral 06/16/2018   Procedure: BONE EXCISION AND SOFT TISSUE;  Surgeon: Albertine Patricia, DPM;  Location: [facility];  Service: Podiatry;  Laterality: Bilateral;  . IRRIGATION AND DEBRIDEMENT FOOT Right 12/20/2017   Procedure: IRRIGATION AND DEBRIDEMENT FOOT;  Surgeon: Sharlotte Alamo, DPM;  Location: [facility];  Service: Podiatry;  Laterality: Right;  . IRRIGATION AND DEBRIDEMENT FOOT Right 12/24/2017   Procedure: IRRIGATION AND DEBRIDEMENT FOOT;  Surgeon: Sharlotte Alamo, DPM;  Location: [facility];  Service: Podiatry;  Laterality: Right;  . IRRIGATION AND DEBRIDEMENT FOOT Right 01/01/2018   Procedure: IRRIGATION AND DEBRIDEMENT FOOT-SKIN,SOFT TISSUE AND BONE;  Surgeon: Albertine Patricia, DPM;  Location: [facility];  Service: Podiatry;  Laterality: Right;  . IRRIGATION AND DEBRIDEMENT FOOT  Left 04/06/2018   Procedure: IRRIGATION AND DEBRIDEMENT FOOT;  Surgeon: Sharlotte Alamo, DPM;  Location: [facility];  Service: Podiatry;  Laterality: Left;  . LOWER EXTREMITY ANGIOGRAPHY Left 04/06/2018   Procedure: Lower Extremity Angiography;  Surgeon: Algernon Huxley, MD;  Location: [place] [facility];  Service: Cardiovascular;  Laterality: Left;     Prior to Admission medications   Medication Sig Start Date End Date Taking? Authorizing Provider  ALPRAZolam (XANAX) 0.25 MG tablet Take 1 tablet (0.25 mg total) by mouth 3 (three) times daily as needed for anxiety. 06/20/18  Yes Gladstone Lighter, MD  amLODipine (NORVASC) 10 MG tablet Take 1 tablet (10 mg total) by mouth daily. 06/21/18  Yes Gladstone Lighter, MD  daptomycin (CUBICIN) IVPB Inject 425 mg into the vein daily for 16 days. Indication:  MRSA Bacteremia Last Day of Therapy:  07/27/2018 Labs - Once weekly:  CBC/D, BMP, and CPK Labs - Every other week:  ESR and CRP 07/11/18 07/27/18 Yes Vaughan Basta, MD  diphenoxylate-atropine (LOMOTIL) 2.5-0.025 MG tablet Take 1 tablet by mouth 4 (four) times daily as needed for diarrhea or loose stools. 04/11/18 04/11/19 Yes Henreitta Leber, MD  ferrous sulfate 325 (65 FE) MG tablet Take 1 tablet (325 mg total) by mouth 2 (two) times daily with a meal. 01/03/18  Yes Sainani, Belia Heman, MD  gabapentin (NEURONTIN) 300 MG capsule Take 1 capsule (300 mg total) by mouth 3 (three) times daily. 07/03/18 07/03/19 Yes Nance Pear, MD  hydrALAZINE (APRESOLINE) 25 MG tablet Take 1 tablet (25 mg total) by mouth every 8 (eight) hours. 06/20/18  Yes Gladstone Lighter, MD  insulin glargine (LANTUS) 100 UNIT/ML injection Inject 0.25 mLs (25 Units total) into the skin at bedtime. 06/20/18  Yes Gladstone Lighter, MD  Insulin Syringes, Disposable, U-100 0.3 ML MISC 1 Syringe by Does not apply route 4 (four) times daily -  with meals and at bedtime. 12/27/17  Yes Wieting, Richard, MD  NOVOLIN R RELION 100 UNIT/ML injection Inject 0-0.1 mLs (0-10 Units total) into the skin 3 (three) times daily as needed for high blood sugar (sliding scale). Patient taking differently: Inject 2 Units into the skin 3 (three) times daily as needed for high blood sugar (sliding scale). Depending on sugar level, pt will inject 5-10 units. 05/02/18 07/16/18 Yes Wieting, Richard, MD   oxyCODONE-acetaminophen (PERCOCET) 10-325 MG tablet Take 1 tablet by mouth every 4 (four) hours as needed for pain. 07/11/18 07/11/19 Yes Vaughan Basta, MD  protein supplement shake (PREMIER PROTEIN) LIQD Take 325 mLs (11 oz total) by mouth 2 (two) times daily between meals. 06/20/18 07/20/18 Yes Gladstone Lighter, MD  vitamin C (VITAMIN C) 250 MG tablet Take 1 tablet (250 mg total) by mouth 2 (two) times daily. 06/20/18  Yes Gladstone Lighter, MD    Family History  Problem Relation Age of Onset  . Diabetes Mother   . Ovarian cancer Mother   . Healthy Father      Social History   Tobacco Use  . Smoking status: Never Smoker  . Smokeless tobacco: Never Used  Substance Use Topics  . Alcohol use: No    Frequency: Never  . Drug use: Yes    Types: Marijuana, PCP    Allergies as of 07/16/2018 - Review Complete 07/16/2018  Allergen Reaction Noted  . Banana Hives, Nausea And Vomiting, and Rash 05/31/2013  . Keflex [cephalexin] Rash 07/13/2017  . Onion Hives, Nausea And Vomiting, and Rash 05/31/2013  . Sulfa antibiotics Anaphylaxis 07/13/2017  .  Grapeseed extract [nutritional supplements] Itching 07/20/2017  . Shellfish allergy Hives 12/23/2017  . Grape seed Rash 04/17/2018    Review of Systems:    All systems reviewed and negative except where noted in HPI.   Physical Exam:  Vital signs in last 24 hours: Temp:  [96.5 F (35.8 C)-99.2 F (37.3 C)] 97.9 F (36.6 C) (01/13 0915) Pulse Rate:  [97-114] 98 (01/13 0915) Resp:  [3-20] 18 (01/13 0510) BP: (150-191)/(100-119) 151/107 (01/13 0915) SpO2:  [98 %-100 %] 100 % (01/13 0915) Last BM Date: 07/16/18 General:   Pleasant, cooperative in NAD Head:  Normocephalic and atraumatic. Eyes:   No icterus.   Conjunctiva pink. PERRLA. Ears:  Normal auditory acuity. Neck:  Supple; no masses or thyroidomegaly Lungs: Respirations even and unlabored. Lungs clear to auscultation bilaterally.   No wheezes, crackles, or rhonchi.   Heart:  Regular rate and rhythm;  Without murmur, clicks, rubs or gallops Abdomen:  Soft, nondistended, nontender. Normal bowel sounds. No appreciable masses or hepatomegaly.  No rebound or guarding.  Neurologic:  Alert and oriented x3;  grossly normal neurologically. Skin:  Intact without significant lesions or rashes. Cervical Nodes:  No significant cervical adenopathy. Psych:  Alert and cooperative. Normal affect.  LAB RESULTS: Recent Labs    07/16/18 1059 07/17/18 0518  WBC 8.5 8.9  HGB 9.3* 9.5*  HCT 27.6* 28.8*  PLT 315 347   BMET Recent Labs    07/16/18 1059 07/17/18 0518  NA 135 139  K 2.9* 3.3*  CL 103 108  CO2 22 25  GLUCOSE 205* 62*  BUN 39* 32*  CREATININE 1.78* 1.62*  CALCIUM 8.6* 8.3*   LFT Recent Labs    07/16/18 1059  PROT 6.1*  ALBUMIN 2.6*  AST 31  ALT 52*  ALKPHOS 162*  BILITOT 0.3  BILIDIR <0.1  IBILI NOT CALCULATED   PT/INR No results for input(s): LABPROT, INR in the last 72 hours.  STUDIES: Dg Chest 2 View  Result Date: 07/16/2018 CLINICAL DATA:  Left-sided chest pain EXAM: CHEST - 2 VIEW COMPARISON:  Chest radiograph 06/14/2018 FINDINGS: Left upper extremity PICC line tip projects over the superior vena cava. Normal cardiac and mediastinal contours. No consolidative pulmonary opacities. No pleural effusion or pneumothorax. Regional skeleton is unremarkable. IMPRESSION: No acute cardiopulmonary process. Electronically Signed   By: Lovey Newcomer M.D.   On: 07/16/2018 11:54   Ct Angio Chest Pe W And/or Wo Contrast  Result Date: 07/16/2018 CLINICAL DATA:  Two-day history of inspiratory chest pain. Elevated D-dimer. One day history of nausea, vomiting and diarrhea. Current history of chronic kidney disease, diabetes, GE reflux disease and irritable bowel syndrome. EXAM: CT ANGIOGRAPHY CHEST CT ABDOMEN AND PELVIS WITH CONTRAST TECHNIQUE: Multidetector CT imaging of the chest was performed using the standard protocol during bolus administration  of intravenous contrast. Multiplanar CT image reconstructions and MIPs were obtained to evaluate the vascular anatomy. Multidetector CT imaging of the abdomen and pelvis was performed using the standard protocol during bolus administration of intravenous contrast. CONTRAST:  143m OMNIPAQUE IOHEXOL 350 MG/ML IV. COMPARISON:  CT abdomen and pelvis 01/10/2018 and earlier. No prior chest CT. FINDINGS: CTA CHEST FINDINGS Cardiovascular: Contrast opacification of pulmonary arteries is very good. No filling defects within either main pulmonary artery or their segmental branches in either lung to suggest pulmonary embolism. Normal heart size with borderline LEFT ventricular hypertrophy. No visible coronary atherosclerosis. No visible atherosclerosis involving the thoracic aorta or proximal great vessels. LEFT arm PICC tip in the  LOWER SVC. Mediastinum/Nodes: No pathologic lymphadenopathy. Circumferential wall thickening involving the distal esophagus. No mediastinal mass. Normal-appearing thyroid gland. Lungs/Pleura: Pulmonary parenchyma clear without localized airspace consolidation, interstitial disease, or parenchymal nodules or masses. Central airways patent without significant bronchial wall thickening. No pleural effusions. No pleural plaques or masses. Musculoskeletal: Regional skeleton unremarkable without acute or significant osseous abnormality. Review of the MIP images confirms the above findings. CT ABDOMEN and PELVIS FINDINGS Hepatobiliary: Liver normal in size and appearance. Gallbladder normal in appearance without calcified gallstones. No biliary ductal dilation. Pancreas: Normal in appearance without evidence of mass, ductal dilation, or inflammation. Spleen: Normal in size and appearance. Adrenals/Urinary Tract: Normal appearing adrenal glands. Kidneys normal in size and appearance without focal parenchymal abnormality. No hydronephrosis. No evidence of urinary tract calculi. Diffuse thickening of the  wall of the urinary bladder with mild enhancement. Stomach/Bowel: Stomach normal in appearance for the degree of distention. Loop of small bowel anteriorly in the upper abdomen, likely a proximal jejunal loop, demonstrating mild fold thickening. Remaining small bowel unremarkable. No evidence of bowel obstruction. Completely decompressed transverse colon with mild wall thickening. Remainder of the colon normal in appearance. Normal appendix in the RIGHT UPPER pelvis anteriorly. Vascular/Lymphatic: No visible aortoiliofemoral atherosclerosis. Widely patent visceral arteries. Normal-appearing portal venous and systemic venous systems. No pathologic lymphadenopathy. Reproductive: Prostate gland and seminal vesicles normal in size and appearance for age. Other: Small amount of ascites dependently in the pelvis and in the RIGHT paracolic gutter. Musculoskeletal: Regional skeleton unremarkable without acute or significant osseous abnormality. Review of the MIP images confirms the above findings. IMPRESSION: 1. No evidence of pulmonary embolism. 2. No acute cardiopulmonary disease. 3. Circumferential wall thickening involving the distal esophagus, likely related to the given history of GE reflux disease. 4. Localized enteritis suspected involving a loop of proximal jejunum. 5. Possible transverse colitis, though the apparent wall thickening may be related to the fact that this segment is nearly completely collapsed. 6. Small amount of ascites dependently in the pelvis and in the RIGHT paracolic gutter. 7. Diffuse thickening of the wall of the urinary bladder, query cystitis. Electronically Signed   By: Evangeline Dakin M.D.   On: 07/16/2018 15:42   Ct Abdomen Pelvis W Contrast  Result Date: 07/16/2018 CLINICAL DATA:  Two-day history of inspiratory chest pain. Elevated D-dimer. One day history of nausea, vomiting and diarrhea. Current history of chronic kidney disease, diabetes, GE reflux disease and irritable bowel  syndrome. EXAM: CT ANGIOGRAPHY CHEST CT ABDOMEN AND PELVIS WITH CONTRAST TECHNIQUE: Multidetector CT imaging of the chest was performed using the standard protocol during bolus administration of intravenous contrast. Multiplanar CT image reconstructions and MIPs were obtained to evaluate the vascular anatomy. Multidetector CT imaging of the abdomen and pelvis was performed using the standard protocol during bolus administration of intravenous contrast. CONTRAST:  114m OMNIPAQUE IOHEXOL 350 MG/ML IV. COMPARISON:  CT abdomen and pelvis 01/10/2018 and earlier. No prior chest CT. FINDINGS: CTA CHEST FINDINGS Cardiovascular: Contrast opacification of pulmonary arteries is very good. No filling defects within either main pulmonary artery or their segmental branches in either lung to suggest pulmonary embolism. Normal heart size with borderline LEFT ventricular hypertrophy. No visible coronary atherosclerosis. No visible atherosclerosis involving the thoracic aorta or proximal great vessels. LEFT arm PICC tip in the LOWER SVC. Mediastinum/Nodes: No pathologic lymphadenopathy. Circumferential wall thickening involving the distal esophagus. No mediastinal mass. Normal-appearing thyroid gland. Lungs/Pleura: Pulmonary parenchyma clear without localized airspace consolidation, interstitial disease, or parenchymal nodules or masses. Central  airways patent without significant bronchial wall thickening. No pleural effusions. No pleural plaques or masses. Musculoskeletal: Regional skeleton unremarkable without acute or significant osseous abnormality. Review of the MIP images confirms the above findings. CT ABDOMEN and PELVIS FINDINGS Hepatobiliary: Liver normal in size and appearance. Gallbladder normal in appearance without calcified gallstones. No biliary ductal dilation. Pancreas: Normal in appearance without evidence of mass, ductal dilation, or inflammation. Spleen: Normal in size and appearance. Adrenals/Urinary Tract:  Normal appearing adrenal glands. Kidneys normal in size and appearance without focal parenchymal abnormality. No hydronephrosis. No evidence of urinary tract calculi. Diffuse thickening of the wall of the urinary bladder with mild enhancement. Stomach/Bowel: Stomach normal in appearance for the degree of distention. Loop of small bowel anteriorly in the upper abdomen, likely a proximal jejunal loop, demonstrating mild fold thickening. Remaining small bowel unremarkable. No evidence of bowel obstruction. Completely decompressed transverse colon with mild wall thickening. Remainder of the colon normal in appearance. Normal appendix in the RIGHT UPPER pelvis anteriorly. Vascular/Lymphatic: No visible aortoiliofemoral atherosclerosis. Widely patent visceral arteries. Normal-appearing portal venous and systemic venous systems. No pathologic lymphadenopathy. Reproductive: Prostate gland and seminal vesicles normal in size and appearance for age. Other: Small amount of ascites dependently in the pelvis and in the RIGHT paracolic gutter. Musculoskeletal: Regional skeleton unremarkable without acute or significant osseous abnormality. Review of the MIP images confirms the above findings. IMPRESSION: 1. No evidence of pulmonary embolism. 2. No acute cardiopulmonary disease. 3. Circumferential wall thickening involving the distal esophagus, likely related to the given history of GE reflux disease. 4. Localized enteritis suspected involving a loop of proximal jejunum. 5. Possible transverse colitis, though the apparent wall thickening may be related to the fact that this segment is nearly completely collapsed. 6. Small amount of ascites dependently in the pelvis and in the RIGHT paracolic gutter. 7. Diffuse thickening of the wall of the urinary bladder, query cystitis. Electronically Signed   By: Evangeline Dakin M.D.   On: 07/16/2018 15:42      Impression / Plan:   EUCLID CASSETTA is a 33 y.o. y/o male with poorly  controlled diabetes, known gastroparesis, Type 1 diabetes admitted with chest pain , nausea , vomiting and some diarrhea. Presently all symptoms have resolved except for some nausea. CT abdomen shows no blockage but does demonstrate features suggestive of esophagitis and enteritis. His symptoms may have either been secondary to gastroparesis from poorly controlled diabetes and acute worsening hyperglycemia vs a viral GI bug vs side effects of antibiotics or a combination of the above.  Since he is improving suggest   1. BID PPI 2. Keep head end of the bed elevated at all times 3. Low fat and fiber meals 4. Limit narcotics 5. Tight glycemic control with sugars <150 mg/dl - above which causes gastroparesis 6.  Outpatient follow up with GI to establish care- will need EGD as an outpatient  Thank you for involving me in the care of this patient.      LOS: 0 days   Shawn Bellows, MD  07/17/2018, 2:43 PM

## 2018-07-18 LAB — HIV ANTIBODY (ROUTINE TESTING W REFLEX): HIV Screen 4th Generation wRfx: NONREACTIVE

## 2018-07-21 LAB — CULTURE, BLOOD (ROUTINE X 2): Culture: NO GROWTH

## 2018-07-25 ENCOUNTER — Telehealth: Payer: Self-pay | Admitting: Licensed Clinical Social Worker

## 2018-07-25 NOTE — Telephone Encounter (Signed)
Received critical glucose lab today of 546, labs were drawn yesterday by [facility]. I called and checked on the patient and he states he feels fine and his glucose today was 201. I advised if it gets higher along with him not feeling well he needs to go to the ED. Patient agreed with plan.

## 2018-07-26 ENCOUNTER — Emergency Department: Payer: Medicaid Other

## 2018-07-26 ENCOUNTER — Emergency Department
Admission: EM | Admit: 2018-07-26 | Discharge: 2018-07-26 | Disposition: A | Discharge status: Home / Self Care | Payer: Medicaid Other | Attending: Emergency Medicine | Admitting: Emergency Medicine

## 2018-07-26 ENCOUNTER — Encounter: Payer: Self-pay | Admitting: Emergency Medicine

## 2018-07-26 DIAGNOSIS — R197 Diarrhea, unspecified: Secondary | ICD-10-CM | POA: Diagnosis not present

## 2018-07-26 DIAGNOSIS — E1022 Type 1 diabetes mellitus with diabetic chronic kidney disease: Secondary | ICD-10-CM | POA: Insufficient documentation

## 2018-07-26 DIAGNOSIS — N189 Chronic kidney disease, unspecified: Secondary | ICD-10-CM | POA: Insufficient documentation

## 2018-07-26 DIAGNOSIS — R112 Nausea with vomiting, unspecified: Secondary | ICD-10-CM | POA: Diagnosis not present

## 2018-07-26 DIAGNOSIS — E1065 Type 1 diabetes mellitus with hyperglycemia: Secondary | ICD-10-CM | POA: Insufficient documentation

## 2018-07-26 DIAGNOSIS — Z79899 Other long term (current) drug therapy: Secondary | ICD-10-CM | POA: Insufficient documentation

## 2018-07-26 DIAGNOSIS — R1110 Vomiting, unspecified: Secondary | ICD-10-CM | POA: Diagnosis present

## 2018-07-26 DIAGNOSIS — E860 Dehydration: Secondary | ICD-10-CM | POA: Diagnosis not present

## 2018-07-26 LAB — COMPREHENSIVE METABOLIC PANEL
ALT: 36 U/L (ref 0–44)
AST: 21 U/L (ref 15–41)
Albumin: 3.3 g/dL — ABNORMAL LOW (ref 3.5–5.0)
Alkaline Phosphatase: 189 U/L — ABNORMAL HIGH (ref 38–126)
Anion gap: 12 (ref 5–15)
BUN: 39 mg/dL — ABNORMAL HIGH (ref 6–20)
CO2: 21 mmol/L — AB (ref 22–32)
Calcium: 8.9 mg/dL (ref 8.9–10.3)
Chloride: 95 mmol/L — ABNORMAL LOW (ref 98–111)
Creatinine, Ser: 2.02 mg/dL — ABNORMAL HIGH (ref 0.61–1.24)
GFR calc Af Amer: 50 mL/min — ABNORMAL LOW (ref >60)
GFR calc non Af Amer: 43 mL/min — ABNORMAL LOW (ref >60)
Glucose, Bld: 586 mg/dL (ref 70–99)
Potassium: 4.2 mmol/L (ref 3.5–5.1)
Sodium: 128 mmol/L — ABNORMAL LOW (ref 135–145)
Total Bilirubin: 0.5 mg/dL (ref 0.3–1.2)
Total Protein: 7.4 g/dL (ref 6.5–8.1)

## 2018-07-26 LAB — CBC
HCT: 33.5 % — ABNORMAL LOW (ref 39.0–52.0)
Hemoglobin: 11.2 g/dL — ABNORMAL LOW (ref 13.0–17.0)
MCH: 26.9 pg (ref 26.0–34.0)
MCHC: 33.4 g/dL (ref 30.0–36.0)
MCV: 80.5 fL (ref 80.0–100.0)
PLATELETS: 394 10*3/uL (ref 150–400)
RBC: 4.16 MIL/uL — ABNORMAL LOW (ref 4.22–5.81)
RDW: 14.4 % (ref 11.5–15.5)
WBC: 10.6 10*3/uL — ABNORMAL HIGH (ref 4.0–10.5)
nRBC: 0 % (ref 0.0–0.2)

## 2018-07-26 LAB — INFLUENZA PANEL BY PCR (TYPE A & B)
Influenza A By PCR: NEGATIVE
Influenza B By PCR: NEGATIVE

## 2018-07-26 LAB — BLOOD GAS, VENOUS
Acid-base deficit: 2.9 mmol/L — ABNORMAL HIGH (ref 0.0–2.0)
Bicarbonate: 23.7 mmol/L (ref 20.0–28.0)
FIO2: 0.21
O2 Saturation: 45.6 %
Patient temperature: 37
pCO2, Ven: 47 mmHg (ref 44.0–60.0)
pH, Ven: 7.31 (ref 7.250–7.430)
pO2, Ven: <31 mmHg — CL (ref 32.0–45.0)

## 2018-07-26 LAB — GLUCOSE, CAPILLARY
GLUCOSE-CAPILLARY: 512 mg/dL — AB (ref 70–99)
Glucose-Capillary: 391 mg/dL — ABNORMAL HIGH (ref 70–99)

## 2018-07-26 LAB — LACTIC ACID, PLASMA: Lactic Acid, Venous: 1.2 mmol/L (ref 0.5–1.9)

## 2018-07-26 LAB — LIPASE, BLOOD: Lipase: 61 U/L — ABNORMAL HIGH (ref 11–51)

## 2018-07-26 MED ORDER — HALOPERIDOL LACTATE 5 MG/ML IJ SOLN
2.0000 mg | Freq: Once | INTRAMUSCULAR | Status: AC
  Administered 2018-07-26: 2 mg via INTRAVENOUS
  Filled 2018-07-26: qty 1

## 2018-07-26 MED ORDER — SODIUM CHLORIDE 0.9% FLUSH: 3.0000 mL | Freq: Once | INTRAVENOUS | Status: DC

## 2018-07-26 MED ORDER — HALOPERIDOL 2 MG PO TABS: 2.0000 mg | ORAL_TABLET | Freq: Three times a day (TID) | ORAL | 0 refills | Status: DC | PRN

## 2018-07-26 MED ORDER — MORPHINE SULFATE (PF) 4 MG/ML IV SOLN
4.0000 mg | Freq: Once | INTRAVENOUS | Status: AC
  Administered 2018-07-26: 4 mg via INTRAVENOUS
  Filled 2018-07-26: qty 1

## 2018-07-26 MED ORDER — ONDANSETRON HCL 4 MG/2ML IJ SOLN
4.0000 mg | Freq: Once | INTRAMUSCULAR | Status: AC
  Administered 2018-07-26: 4 mg via INTRAVENOUS
  Filled 2018-07-26: qty 2

## 2018-07-26 MED ORDER — SODIUM CHLORIDE 0.9 % IV BOLUS
1000.0000 mL | Freq: Once | INTRAVENOUS | Status: AC
  Administered 2018-07-26: 1000 mL via INTRAVENOUS

## 2018-07-26 MED ORDER — INSULIN ASPART 100 UNIT/ML ~~LOC~~ SOLN
10.0000 [IU] | Freq: Once | SUBCUTANEOUS | Status: AC
  Administered 2018-07-26: 10 [IU] via INTRAVENOUS
  Filled 2018-07-26: qty 1

## 2018-07-26 NOTE — ED Provider Notes (Signed)
[facility] Emergency Department Provider Note  ____________________________________________  Time seen: Approximately 5:54 PM  I have reviewed the triage vital signs and the nursing notes.   HISTORY  Chief Complaint Emesis and Generalized Body Aches   HPI VANE YAPP is a 33 y.o. male with a history of type 1 diabetes, chronic kidney disease, osteomyelitis complicated by MRSA bacteremia currently with a PICC line on daptomycin who presents for evaluation of body aches, vomiting and diarrhea.  His symptoms started yesterday around midnight.  Has had several episodes of nonbloody nonbilious emesis and watery diarrhea.  He has had no fever but reports diffuse body aches and chills.  Has had a mild cough.  Also complaining of diffuse crampy severe abdominal pain.  Patient Dors is compliance with his antibiotics and insulin.  This morning his sugar was running very high which made him concerned he was in DKA and prompted his visit to the emergency room.  Past Medical History:  Diagnosis Date  . CKD (chronic kidney disease)   . Diabetes mellitus without complication ([place])   . GERD (gastroesophageal reflux disease)   . IBS (irritable bowel syndrome)   . Osteomyelitis [facility])     Patient Active Problem List   Diagnosis Date Noted  . Nausea & vomiting 07/16/2018  . IV infusion line dysfunction ([employer]) 07/09/2018  . Hyperglycemic hyperosmolar nonketotic coma ([place]) 05/28/2018  . Acute renal failure (ARF) ([place]) 04/27/2018  . Left foot infection 04/05/2018  . Foot infection 02/06/2018  . Osteomyelitis ([place]) 01/10/2018  . Cellulitis 12/29/2017  . Sepsis ([place]) 12/18/2017  . Moderate recurrent major depression ([place]) 11/10/2017  . Type 2 diabetes mellitus with hyperosmolar nonketotic hyperglycemia ([place]) 11/09/2017  . History of migraine 07/15/2017  . IBS (irritable bowel syndrome) 07/15/2017  . Protein-calorie malnutrition, severe 07/14/2017  . Abdominal pain  07/13/2017  . GERD (gastroesophageal reflux disease) 12/30/2009  . Diabetes ([place]) 11/25/2009  . MDD (major depressive disorder) 11/25/2009  . Hypercholesterolemia 03/07/2008    Past Surgical History:  Procedure Laterality Date  . ABDOMINAL AORTOGRAM W/LOWER EXTREMITY Right 12/23/2017   Procedure: ABDOMINAL AORTOGRAM W/LOWER EXTREMITY;  Surgeon: Katha Cabal, MD;  Location: [place] CV LAB;  Service: Cardiovascular;  Laterality: Right;  . ACHILLES TENDON SURGERY Bilateral 06/16/2018   Procedure: ACHILLES LENGTHENING/KIDNER;  Surgeon: Albertine Patricia, DPM;  Location: [facility] ORS;  Service: Podiatry;  Laterality: Bilateral;  . AMPUTATION Right 12/24/2017   Procedure: AMPUTATION RAY;  Surgeon: Sharlotte Alamo, DPM;  Location: [facility] ORS;  Service: Podiatry;  Laterality: Right;  . AMPUTATION Left 04/06/2018   Procedure: AMPUTATION RAY;  Surgeon: Sharlotte Alamo, DPM;  Location: [facility] ORS;  Service: Podiatry;  Laterality: Left;  . AMPUTATION Left 04/09/2018   Procedure: AMPUTATION RAY;  Surgeon: Sharlotte Alamo, DPM;  Location: [facility] ORS;  Service: Podiatry;  Laterality: Left;  . APPLICATION OF WOUND VAC Right 12/24/2017   Procedure: APPLICATION OF WOUND VAC;  Surgeon: Sharlotte Alamo, DPM;  Location: [facility] ORS;  Service: Podiatry;  Laterality: Right;  . APPLICATION OF WOUND VAC Right 01/01/2018   Procedure: APPLICATION OF WOUND VAC;  Surgeon: Albertine Patricia, DPM;  Location: [facility] ORS;  Service: Podiatry;  Laterality: Right;  . BONE EXCISION Bilateral 06/16/2018   Procedure: BONE EXCISION AND SOFT TISSUE;  Surgeon: Albertine Patricia, DPM;  Location: [facility] ORS;  Service: Podiatry;  Laterality: Bilateral;  . IRRIGATION AND DEBRIDEMENT FOOT Right 12/20/2017   Procedure: IRRIGATION AND DEBRIDEMENT FOOT;  Surgeon: Sharlotte Alamo, DPM;  Location: [facility] ORS;  Service: Podiatry;  Laterality: Right;  . IRRIGATION AND DEBRIDEMENT FOOT Right 12/24/2017   Procedure: IRRIGATION AND DEBRIDEMENT FOOT;  Surgeon: Sharlotte Alamo, DPM;   Location: [facility] OR[facility];  Service: Podiatry;  Laterality: Right;  . IRRIGATION AND DEBRIDEMENT FOOT Right 01/01/2018   Procedure: IRRIGATION AND DEBRIDEMENT FOOT-SKIN,SOFT TISSUE AND BONE;  Surgeon: Albertine Patricia, DPM;  Location: [facility] OR[facility];  Service: Podiatry;  Laterality: Right;  . IRRIGATION AND DEBRIDEMENT FOOT Left 04/06/2018   Procedure: IRRIGATION AND DEBRIDEMENT FOOT;  Surgeon: Sharlotte Alamo, DPM;  Location: [facility] ORS;  Service: Podiatry;  Laterality: Left;  . LOWER EXTREMITY ANGIOGRAPHY Left 04/06/2018   Procedure: Lower Extremity Angiography;  Surgeon: Algernon Huxley, MD;  Location: [place] CV LAB;  Service: Cardiovascular;  Laterality: Left;    Prior to Admission medications   Medication Sig Start Date End Date Taking? Authorizing Provider  ALPRAZolam (XANAX) 0.25 MG tablet Take 1 tablet (0.25 mg total) by mouth 3 (three) times daily as needed for anxiety. 06/20/18  Yes Gladstone Lighter, MD  amLODipine (NORVASC) 10 MG tablet Take 1 tablet (10 mg total) by mouth daily. 06/21/18  Yes Gladstone Lighter, MD  daptomycin (CUBICIN) IVPB Inject 425 mg into the vein daily for 16 days. Indication:  MRSA Bacteremia Last Day of Therapy:  07/27/2018 Labs - Once weekly:  CBC/D, BMP, and CPK Labs - Every other week:  ESR and CRP 07/11/18 07/27/18 Yes Vaughan Basta, MD  diphenoxylate-atropine (LOMOTIL) 2.5-0.025 MG tablet Take 1 tablet by mouth 4 (four) times daily as needed for diarrhea or loose stools. 04/11/18 04/11/19 Yes Henreitta Leber, MD  ferrous sulfate 325 (65 FE) MG tablet Take 1 tablet (325 mg total) by mouth 2 (two) times daily with a meal. 01/03/18  Yes Sainani, Belia Heman, MD  gabapentin (NEURONTIN) 300 MG capsule Take 1 capsule (300 mg total) by mouth 3 (three) times daily. 07/03/18 07/03/19 Yes Nance Pear, MD  hydrALAZINE (APRESOLINE) 25 MG tablet Take 1 tablet (25 mg total) by mouth every 8 (eight) hours. 06/20/18  Yes Gladstone Lighter, MD  insulin glargine (LANTUS) 100 UNIT/ML  injection Inject 0.25 mLs (25 Units total) into the skin at bedtime. 06/20/18  Yes Gladstone Lighter, MD  NOVOLIN R RELION 100 UNIT/ML injection Inject 0-0.1 mLs (0-10 Units total) into the skin 3 (three) times daily as needed for high blood sugar (sliding scale). Patient taking differently: Inject 2 Units into the skin 3 (three) times daily as needed for high blood sugar (sliding scale). Depending on sugar level, pt will inject 5-10 units. 05/02/18 07/26/18 Yes Wieting, Richard, MD  oxyCODONE-acetaminophen (PERCOCET) 10-325 MG tablet Take 1 tablet by mouth every 4 (four) hours as needed for pain. 07/11/18 07/11/19 Yes Vaughan Basta, MD  pantoprazole (PROTONIX) 40 MG tablet Take 1 tablet (40 mg total) by mouth 2 (two) times daily before a meal. 07/17/18  Yes Demetrios Loll, MD  vitamin C (VITAMIN C) 250 MG tablet Take 1 tablet (250 mg total) by mouth 2 (two) times daily. 06/20/18  Yes Gladstone Lighter, MD  zolpidem (AMBIEN) 10 MG tablet Take 10 mg by mouth at bedtime as needed for sleep.   Yes [provider]  haloperidol (HALDOL) 2 MG tablet Take 1 tablet (2 mg total) by mouth every 8 (eight) hours as needed (abdominal pain, nausea, and vomiting). 07/26/18 07/26/19  Rudene Re, MD  Insulin Syringes, Disposable, U-100 0.3 ML MISC 1 Syringe by Does not apply route 4 (four) times daily -  with meals and at bedtime. 12/27/17  Loletha Grayer, MD    Allergies Banana; Keflex [cephalexin]; Onion; Sulfa antibiotics; Grapeseed extract [nutritional supplements]; Shellfish allergy; and Grape seed  Family History  Problem Relation Age of Onset  . Diabetes Mother   . Ovarian cancer Mother   . Healthy Father     Social History Social History   Tobacco Use  . Smoking status: Never Smoker  . Smokeless tobacco: Never Used  Substance Use Topics  . Alcohol use: No    Frequency: Never  . Drug use: Yes    Types: Marijuana, PCP    Review of Systems  Constitutional: Negative for  fever. + Body aches Eyes: Negative for visual changes. ENT: Negative for sore throat. Neck: No neck pain  Cardiovascular: Negative for chest pain. Respiratory: Negative for shortness of breath. Gastrointestinal: + abdominal pain, vomiting and diarrhea. Genitourinary: Negative for dysuria. Musculoskeletal: Negative for back pain. Skin: Negative for rash. Neurological: Negative for headaches, weakness or numbness. Psych: No SI or HI  ____________________________________________   PHYSICAL EXAM:  VITAL SIGNS: ED Triage Vitals  Enc Vitals Group     BP 07/26/18 1653 120/75     Pulse Rate 07/26/18 1653 (!) 124     Resp 07/26/18 1653 (!) 22     Temp 07/26/18 1653 98.6 F (37 C)     Temp Source 07/26/18 1653 Oral     SpO2 07/26/18 1653 99 %     Weight 07/26/18 1656 130 lb (59 kg)     Height 07/26/18 1656 _0  (1.676 m)     Head Circumference --      Peak Flow --      Pain Score 07/26/18 1655 10     Pain Loc --      Pain Edu? --      Excl. in [place]? --     Constitutional: Alert and oriented, chronically ill-appearing  HEENT:      Head: Normocephalic and atraumatic.         Eyes: Conjunctivae are normal. Sclera is non-icteric.       Mouth/Throat: Mucous membranes are dry.       Neck: Supple with no signs of meningismus. Cardiovascular: Tachycardic with regular rhythm. No murmurs, gallops, or rubs. 2+ symmetrical distal pulses are present in all extremities. No JVD. Respiratory: Normal respiratory effort, tachypneic. Lungs are clear to auscultation bilaterally. No wheezes, crackles, or rhonchi.  Gastrointestinal: Soft, diffuse tenderness to palpation, and non distended with positive bowel sounds. No rebound or guarding. Musculoskeletal: Nontender with normal range of motion in all extremities. No edema, cyanosis, or erythema of extremities.Well healing amputation sites on b/l feet Neurologic: Normal speech and language. Face is symmetric. Moving all extremities. No gross focal  neurologic deficits are appreciated. Skin: Skin is warm, dry and intact. No rash noted. Psychiatric: Mood and affect are normal. Speech and behavior are normal.  ____________________________________________   LABS (all labs ordered are listed, but only abnormal results are displayed)  Labs Reviewed  GLUCOSE, CAPILLARY - Abnormal; Notable for the following components:      Result Value   Glucose-Capillary 512 (*)    All other components within normal limits  CBC - Abnormal; Notable for the following components:   WBC 10.6 (*)    RBC 4.16 (*)    Hemoglobin 11.2 (*)    HCT 33.5 (*)    All other components within normal limits  LIPASE, BLOOD - Abnormal; Notable for the following components:   Lipase 61 (*)    All other  components within normal limits  COMPREHENSIVE METABOLIC PANEL - Abnormal; Notable for the following components:   Sodium 128 (*)    Chloride 95 (*)    CO2 21 (*)    Glucose, Bld 586 (*)    BUN 39 (*)    Creatinine, Ser 2.02 (*)    Albumin 3.3 (*)    Alkaline Phosphatase 189 (*)    GFR calc non Af Amer 43 (*)    GFR calc Af Amer 50 (*)    All other components within normal limits  BLOOD GAS, VENOUS - Abnormal; Notable for the following components:   pO2, Ven <31.0 (*)    Acid-base deficit 2.9 (*)    All other components within normal limits  GLUCOSE, CAPILLARY - Abnormal; Notable for the following components:   Glucose-Capillary 391 (*)    All other components within normal limits  LACTIC ACID, PLASMA  INFLUENZA PANEL BY PCR (TYPE A & B)  URINALYSIS, COMPLETE (UACMP) WITH MICROSCOPIC  LACTIC ACID, PLASMA  CBG MONITORING, ED  CBG MONITORING, ED   ____________________________________________  EKG  ED ECG REPORT I, Rudene Re, the attending physician, personally viewed and interpreted this ECG.  Sinus tachycardia, rate of 125, normal intervals, borderline right axis deviation, T wave inversions in inferior leads with no ST elevations or  depressions.  No significant changes when compared to prior ____________________________________________  RADIOLOGY  I have personally reviewed the images performed during this visit and I agree with the Radiologist's read.   Interpretation by Radiologist:  Dg Chest 2 View  Result Date: 07/26/2018 CLINICAL DATA:  Nausea and vomiting. EXAM: CHEST - 2 VIEW COMPARISON:  July 16, 2018 FINDINGS: The left PICC line terminates in the central SVC. The heart, hila, mediastinum, lungs, and pleura are normal. IMPRESSION: Stable PICC line in good position.  No acute abnormalities. Electronically Signed   By: Dorise Bullion III M.D   On: 07/26/2018 18:27      ____________________________________________   PROCEDURES  Procedure(s) performed: None Procedures Critical Care performed:  None ____________________________________________   INITIAL IMPRESSION / ASSESSMENT AND PLAN / ED COURSE  33 y.o. male with a history of type 1 diabetes, chronic kidney disease, osteomyelitis complicated by MRSA bacteremia currently with a PICC line on daptomycin who presents for evaluation of body aches, vomiting and diarrhea.   Differential diagnosis including flu versus viral gastroenteritis versus pneumonia versus DKA.  Patient looks dry on exam, tachycardic and tachypneic, afebrile, diffusely tender abdomen with no rebound or guarding and clear lungs on auscultation, well-healing amputation sites on bilateral feet.  We will check labs to rule out DKA, will do flu swab, chest x-ray.  Will give Zofran, morphine, fluids for symptom relief.  Clinical Course as of Jul 26 2057  Wed Jul 26, 2018  2055 Patient feels better.  He is tolerating p.o.  Labs with no evidence of DKA with no anion gap.  Flu negative.  Chest x-ray negative for pneumonia.  Patient seems to have improved after IV Haldol.  Since he has a history of gastroparesis I will send him home with a short course of Haldol p.o.  Discussed strict return  precautions with patient and close follow-up with his primary care doctor.  Patient has an appointment in 5 days.   [CV]    Clinical Course User Index [CV] Alfred Levins [place], MD     As part of my medical decision making, I reviewed the following data within the [place] notes reviewed and  incorporated, Labs reviewed , EKG interpreted , Old EKG reviewed, Old chart reviewed, Radiograph reviewed , Notes from prior ED visits and [place] Controlled Substance Database    Pertinent labs & imaging results that were available during my care of the patient were reviewed by me and considered in my medical decision making (see chart for details).    ____________________________________________   FINAL CLINICAL IMPRESSION(S) / ED DIAGNOSES  Final diagnoses:  Nausea vomiting and diarrhea  Dehydration  Hyperglycemia due to type 1 diabetes mellitus ([place])      NEW MEDICATIONS STARTED DURING THIS VISIT:  ED Discharge Orders         Ordered    haloperidol (HALDOL) 2 MG tablet  Every 8 hours PRN     07/26/18 2057           Note:  This document was prepared using Dragon voice recognition software and may include unintentional dictation errors.    Alfred Levins, [place], MD 07/26/18 2100

## 2018-07-26 NOTE — ED Triage Notes (Addendum)
Patient presents to the ED with nausea, vomiting and diarrhea and hyperglycemia.  Patient states, "it started around 11:30-12 o'clock last night."  Patient states his blood glucose meter is reading, "HIGH".  Patient has a PICC line and receives IV antibiotics for a MRSA infection from home health twice a week.  Patient has had toe amputations in the past year.

## 2018-07-26 NOTE — Discharge Instructions (Addendum)
Drink plenty of fluids at home.  Take Haldol as needed for abdominal pain, nausea and vomiting.  Follow-up with your primary care doctor in 2 days.  Return to the emergency room if your sugars continue to trend up, if you have several episodes of vomiting or diarrhea, if you develop fever, chest pain, shortness of breath or abdominal pain.

## 2018-07-26 NOTE — ED Notes (Signed)
Patient transported to X-ray 

## 2018-07-26 NOTE — ED Notes (Signed)
PO challenge initiated

## 2018-07-27 ENCOUNTER — Telehealth: Payer: Self-pay | Admitting: Infectious Diseases

## 2018-07-27 NOTE — Telephone Encounter (Signed)
Thank you :)

## 2018-07-27 NOTE — Telephone Encounter (Signed)
Patient has a question about his PICC line. Wants to know about making an appointment or not.  Thanks!

## 2018-07-27 NOTE — Telephone Encounter (Signed)
Spoke with patient to let him know that his nurse will remove his PICC line when she arrives today. Patient was unsure of the process, but agreed with the plan.

## 2018-08-07 ENCOUNTER — Ambulatory Visit: Payer: Medicaid Other | Admitting: Nurse Practitioner

## 2018-08-14 ENCOUNTER — Other Ambulatory Visit: Payer: Self-pay

## 2018-08-14 ENCOUNTER — Ambulatory Visit: Payer: Medicaid Other | Attending: Nurse Practitioner | Admitting: Nurse Practitioner

## 2018-08-14 ENCOUNTER — Encounter: Payer: Self-pay | Admitting: Nurse Practitioner

## 2018-08-14 VITALS — BP 108/85 | HR 124 | Temp 97.7°F | Ht 66.0 in | Wt 120.0 lb

## 2018-08-14 DIAGNOSIS — Z79891 Long term (current) use of opiate analgesic: Secondary | ICD-10-CM | POA: Insufficient documentation

## 2018-08-14 DIAGNOSIS — Z79899 Other long term (current) drug therapy: Secondary | ICD-10-CM | POA: Diagnosis present

## 2018-08-14 DIAGNOSIS — M899 Disorder of bone, unspecified: Secondary | ICD-10-CM | POA: Diagnosis present

## 2018-08-14 DIAGNOSIS — M79673 Pain in unspecified foot: Secondary | ICD-10-CM | POA: Insufficient documentation

## 2018-08-14 DIAGNOSIS — R1084 Generalized abdominal pain: Secondary | ICD-10-CM | POA: Diagnosis not present

## 2018-08-14 DIAGNOSIS — G8929 Other chronic pain: Secondary | ICD-10-CM | POA: Diagnosis present

## 2018-08-14 DIAGNOSIS — M79604 Pain in right leg: Secondary | ICD-10-CM | POA: Insufficient documentation

## 2018-08-14 DIAGNOSIS — G894 Chronic pain syndrome: Secondary | ICD-10-CM | POA: Insufficient documentation

## 2018-08-14 DIAGNOSIS — M79605 Pain in left leg: Secondary | ICD-10-CM

## 2018-08-14 DIAGNOSIS — Z789 Other specified health status: Secondary | ICD-10-CM | POA: Insufficient documentation

## 2018-08-14 NOTE — Patient Instructions (Signed)

## 2018-08-14 NOTE — Progress Notes (Signed)
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 (L>R)   2. Chronic pain of both lower extremities (Secondary Area of Pain) (L>R)   3. Chronic generalized abdominal pain (Tertiary Area of Pain)   4. Chronic pain syndrome   5. Long term current use of opiate analgesic   6. Pharmacologic therapy   7. Disorder of skeletal system   8. Problems influencing health status    Plan of Care  Initial treatment plan:  Please be advised that as per protocol, today's visit has been an evaluation only. We have not taken over the patient's controlled substance management.  Problem-specific plan: No problem-specific Assessment & Plan notes found for this encounter.  Ordered Lab-work, Procedure(s), Referral(s), & Consult(s): Orders Placed This Encounter  Procedures  . Compliance Drug Analysis, Ur  . 25-Hydroxyvitamin D Lcms D2+D3  . Sedimentation rate  . C-reactive protein  . Comp. Metabolic Panel (12)   Pharmacotherapy: Medications ordered:  No orders of the defined types were placed in this encounter.  Medications administered during this visit: Sohail L. Barnier had no medications administered during this visit.   Pharmacotherapy under  consideration:  Opioid Analgesics: The patient was informed that there is no guarantee that he would be a candidate for opioid analgesics. The decision will be made following CDC guidelines. This decision will be based on the results of diagnostic studies, as well as Mr. Hohn's risk profile.  Membrane stabilizer: To be determined at a later time Muscle relaxant: To be determined at a  later time NSAID: To be determined at a later time Other analgesic(s): To be determined at a later time   Interventional therapies under consideration: Mr. Rennert was informed that there is no guarantee that he would be a candidate for interventional therapies. The decision will be based on the results of diagnostic studies, as well as Mr. Grounds's risk profile.  Possible procedure(s): Diagnostic celiac plexus nerve block   Provider-requested follow-up: Return for 2nd Visit, w/ Dr. Dossie Arbour.  Future Appointments  Date Time Provider [place]  08/21/2018  8:45 AM Milinda Pointer, MD [facility] None    Primary Care Physician: Valera Castle, MD Location: [facility] Note by:  Date: 08/14/2018; Time: 10:34 AM  Pain Score Disclaimer: We use the NRS-11 scale. This is a self-reported, subjective measurement of pain severity with only modest accuracy. It is used primarily to identify changes within a particular patient. It must be understood that outpatient pain scales are significantly less accurate that those used for research, where they can be applied under ideal controlled circumstances with minimal exposure to variables. In reality, the score is likely to be a combination of pain intensity and pain affect, where pain affect describes the degree of emotional arousal or changes in action readiness caused by the sensory experience of pain. Factors such as social and work situation, setting, emotional state, anxiety levels, expectation, and prior pain experience may  influence pain perception and show large inter-individual differences that may also be affected by time variables.  Patient instructions provided during this appointment: Patient Instructions   ____________________________________________________________________________________________  Appointment Policy Summary  It is our goal and responsibility to provide the medical community with assistance in the evaluation and management of patients with chronic pain. Unfortunately our resources are limited. Because we do not have an unlimited amount of time, or available appointments, we are required to closely monitor and manage their use. The following rules exist to maximize their use:  Patient's responsibilities: 1. Punctuality:  At what time should I arrive? You should be physically present in our office 30 minutes before your scheduled appointment. Your scheduled appointment is with your assigned healthcare provider. However, it takes 5-10 minutes to be "checked-in", and another 15 minutes for the nurses to do the admission. If you arrive to our office at the time you were given for your appointment, you will end up being at least 20-25 minutes late to your appointment with the provider. 2. Tardiness:  What happens if I arrive only a few minutes after my scheduled appointment time? You will need to reschedule your appointment. The cutoff is your appointment time. This is why it is so important that you arrive at least 30 minutes before that appointment. If you have an appointment scheduled for 10:00 AM and you arrive at 10:01, you will be required to reschedule your appointment.  3. Plan ahead:  Always assume that you will encounter traffic on your way in. Plan for it. If you are dependent on a driver, make sure they understand these rules and the need to arrive early. 4. Other appointments and responsibilities:  Avoid scheduling any other appointments before or after your pain clinic appointments.   5. Be prepared:  Write down everything that you need to discuss with your healthcare provider and give this information to the admitting nurse. Write down the medications that you will need refilled. Bring your pills and bottles (even the empty ones), to all of your appointments, except for those where a procedure  is scheduled. 6. No children or pets:  Find someone to take care of them. It is not appropriate to bring them in. 7. Scheduling changes:  We request "advanced notification" of any changes or cancellations. 8. Advanced notification:  Defined as a time period of more than 24 hours prior to the originally scheduled appointment. This allows for the appointment to be offered to other patients. 9. Rescheduling:  When a visit is rescheduled, it will require the cancellation of the original appointment. For this reason they both fall within the category of "Cancellations".  10. Cancellations:  They require advanced notification. Any cancellation less than 24 hours before the  appointment will be recorded as a "No Show". 11. No Show:  Defined as an unkept appointment where the patient failed to notify or declare to the practice their intention or inability to keep the appointment.  Corrective process for repeat offenders:  1. Tardiness: Three (3) episodes of rescheduling due to late arrivals will be recorded as one (1) "No Show". 2. Cancellation or reschedule: Three (3) cancellations or rescheduling will be recorded as one (1) "No Show". 3. "No Shows": Three (3) "No Shows" within a 12 month period will result in discharge from the practice. ____________________________________________________________________________________________   ______________________________________________________________________________________________  Specialty Pain Scale  Introduction:  There are significant differences in how pain is reported. The word pain usually refers to physical pain, but it is also a  common synonym of suffering. The medical community uses a scale from 0 (zero) to 10 (ten) to report pain level. Zero (0) is described as "no pain", while ten (10) is described as "the worse pain you can imagine". The problem with this scale is that physical pain is reported along with suffering. Suffering refers to mental pain, or more often yet it refers to any unpleasant feeling, emotion or aversion associated with the perception of harm or threat of harm. It is the psychological component of pain.  Pain Specialists prefer to separate the two components. The pain scale used by this practice is the Verbal Numerical Rating Scale (VNRS-11). This scale is for the physical pain only. DO NOT INCLUDE how your pain psychologically affects you. This scale is for adults 63 years of age and older. It has 11 (eleven) levels. The 1st level is 0/10. This means: "right now, I have no pain". In the context of pain management, it also means: "right now, my physical pain is under control with the current therapy".  General Information:  The scale should reflect your current level of pain. Unless you are specifically asked for the level of your worst pain, or your average pain. If you are asked for one of these two, then it should be understood that it is over the past 24 hours.  Levels 1 (one) through 5 (five) are described below, and can be treated as an outpatient. Ambulatory pain management facilities such as ours are more than adequate to treat these levels. Levels 6 (six) through 10 (ten) are also described below, however, these must be treated as a hospitalized patient. While levels 6 (six) and 7 (seven) may be evaluated at an urgent care facility, levels 8 (eight) through 10 (ten) constitute medical emergencies and as such, they belong in a hospital's emergency department. When having these levels (as described below), do not come to our office. Our facility is not equipped to manage these levels. Go directly to an  urgent care facility or an emergency department to be evaluated.  Definitions:  Activities of  Daily Living (ADL): Activities of daily living (ADL or ADLs) is a term used in healthcare to refer to people's daily self-care activities. Health professionals often use a person's ability or inability to perform ADLs as a measurement of their functional status, particularly in regard to people post injury, with disabilities and the elderly. There are two ADL levels: Basic and Instrumental. Basic Activities of Daily Living (BADL  or BADLs) consist of self-care tasks that include: Bathing and showering; personal hygiene and grooming (including brushing/combing/styling hair); dressing; Toilet hygiene (getting to the toilet, cleaning oneself, and getting back up); eating and self-feeding (not including cooking or chewing and swallowing); functional mobility, often referred to as "transferring", as measured by the ability to walk, get in and out of bed, and get into and out of a chair; the broader definition (moving from one place to another while performing activities) is useful for people with different physical abilities who are still able to get around independently. Basic ADLs include the things many people do when they get up in the morning and get ready to go out of the house: get out of bed, go to the toilet, bathe, dress, groom, and eat. On the average, loss of function typically follows a particular order. Hygiene is the first to go, followed by loss of toilet use and locomotion. The last to go is the ability to eat. When there is only one remaining area in which the person is independent, there is a 62.9% chance that it is eating and only a 3.5% chance that it is hygiene. Instrumental Activities of Daily Living (IADL or IADLs) are not necessary for fundamental functioning, but they let an individual live independently in a community. IADL consist of tasks that include: cleaning and maintaining the house; home  establishment and maintenance; care of others (including selecting and supervising caregivers); care of pets; child rearing; managing money; managing financials (investments, etc.); meal preparation and cleanup; shopping for groceries and necessities; moving within the community; safety procedures and emergency responses; health management and maintenance (taking prescribed medications); and using the telephone or other form of communication.  Instructions:  Most patients tend to report their pain as a combination of two factors, their physical pain and their psychosocial pain. This last one is also known as "suffering" and it is reflection of how physical pain affects you socially and psychologically. From now on, report them separately.  From this point on, when asked to report your pain level, report only your physical pain. Use the following table for reference.  Pain Clinic Pain Levels (0-5/10)  Pain Level Score  Description  No Pain 0   Mild pain 1 Nagging, annoying, but does not interfere with basic activities of daily living (ADL). Patients are able to eat, bathe, get dressed, toileting (being able to get on and off the toilet and perform personal hygiene functions), transfer (move in and out of bed or a chair without assistance), and maintain continence (able to control bladder and bowel functions). Blood pressure and heart rate are unaffected. A normal heart rate for a healthy adult ranges from 60 to 100 bpm (beats per minute).   Mild to moderate pain 2 Noticeable and distracting. Impossible to hide from other people. More frequent flare-ups. Still possible to adapt and function close to normal. It can be very annoying and may have occasional stronger flare-ups. With discipline, patients may get used to it and adapt.   Moderate pain 3 Interferes significantly with activities of daily living (ADL). It  becomes difficult to feed, bathe, get dressed, get on and off the toilet or to perform  personal hygiene functions. Difficult to get in and out of bed or a chair without assistance. Very distracting. With effort, it can be ignored when deeply involved in activities.   Moderately severe pain 4 Impossible to ignore for more than a few minutes. With effort, patients may still be able to manage work or participate in some social activities. Very difficult to concentrate. Signs of autonomic nervous system discharge are evident: dilated pupils (mydriasis); mild sweating (diaphoresis); sleep interference. Heart rate becomes elevated (>115 bpm). Diastolic blood pressure (lower number) rises above 100 mmHg. Patients find relief in laying down and not moving.   Severe pain 5 Intense and extremely unpleasant. Associated with frowning face and frequent crying. Pain overwhelms the senses.  Ability to do any activity or maintain social relationships becomes significantly limited. Conversation becomes difficult. Pacing back and forth is common, as getting into a comfortable position is nearly impossible. Pain wakes you up from deep sleep. Physical signs will be obvious: pupillary dilation; increased sweating; goosebumps; brisk reflexes; cold, clammy hands and feet; nausea, vomiting or dry heaves; loss of appetite; significant sleep disturbance with inability to fall asleep or to remain asleep. When persistent, significant weight loss is observed due to the complete loss of appetite and sleep deprivation.  Blood pressure and heart rate becomes significantly elevated. Caution: If elevated blood pressure triggers a pounding headache associated with blurred vision, then the patient should immediately seek attention at an urgent or emergency care unit, as these may be signs of an impending stroke.    Emergency Department Pain Levels (6-10/10)  Emergency Room Pain 6 Severely limiting. Requires emergency care and should not be seen or managed at an outpatient pain management facility. Communication becomes  difficult and requires great effort. Assistance to reach the emergency department may be required. Facial flushing and profuse sweating along with potentially dangerous increases in heart rate and blood pressure will be evident.   Distressing pain 7 Self-care is very difficult. Assistance is required to transport, or use restroom. Assistance to reach the emergency department will be required. Tasks requiring coordination, such as bathing and getting dressed become very difficult.   Disabling pain 8 Self-care is no longer possible. At this level, pain is disabling. The individual is unable to do even the most "basic" activities such as walking, eating, bathing, dressing, transferring to a bed, or toileting. Fine motor skills are lost. It is difficult to think clearly.   Incapacitating pain 9 Pain becomes incapacitating. Thought processing is no longer possible. Difficult to remember your own name. Control of movement and coordination are lost.   The worst pain imaginable 10 At this level, most patients pass out from pain. When this level is reached, collapse of the autonomic nervous system occurs, leading to a sudden drop in blood pressure and heart rate. This in turn results in a temporary and dramatic drop in blood flow to the brain, leading to a loss of consciousness. Fainting is one of the body's self defense mechanisms. Passing out puts the brain in a calmed state and causes it to shut down for a while, in order to begin the healing process.    Summary: 1. Refer to this scale when providing [place] with your pain level. 2. Be accurate and careful when reporting your pain level. This will help with your care. 3. Over-reporting your pain level will lead to loss of credibility. 4. Even  a level of 1/10 means that there is pain and will be treated at our facility. 5. High, inaccurate reporting will be documented as "Symptom Exaggeration", leading to loss of credibility and suspicions of possible secondary  gains such as obtaining more narcotics, or wanting to appear disabled, for fraudulent reasons. 6. Only pain levels of 5 or below will be seen at our facility. 7. Pain levels of 6 and above will be sent to the Emergency Department and the appointment cancelled. ______________________________________________________________________________________________

## 2018-08-16 ENCOUNTER — Other Ambulatory Visit: Payer: Self-pay

## 2018-08-16 ENCOUNTER — Inpatient Hospital Stay
Admission: EM | Admit: 2018-08-16 | Discharge: 2018-08-18 | DRG: 638 | Disposition: A | Discharge status: Home / Self Care | Payer: Medicaid Other | Attending: Internal Medicine | Admitting: Internal Medicine

## 2018-08-16 ENCOUNTER — Encounter: Payer: Self-pay | Admitting: Emergency Medicine

## 2018-08-16 DIAGNOSIS — Z882 Allergy status to sulfonamides status: Secondary | ICD-10-CM

## 2018-08-16 DIAGNOSIS — E860 Dehydration: Secondary | ICD-10-CM | POA: Diagnosis present

## 2018-08-16 DIAGNOSIS — N179 Acute kidney failure, unspecified: Secondary | ICD-10-CM | POA: Diagnosis present

## 2018-08-16 DIAGNOSIS — K589 Irritable bowel syndrome without diarrhea: Secondary | ICD-10-CM | POA: Diagnosis present

## 2018-08-16 DIAGNOSIS — Z888 Allergy status to other drugs, medicaments and biological substances status: Secondary | ICD-10-CM | POA: Diagnosis not present

## 2018-08-16 DIAGNOSIS — N183 Chronic kidney disease, stage 3 (moderate): Secondary | ICD-10-CM | POA: Diagnosis present

## 2018-08-16 DIAGNOSIS — Z91013 Allergy to seafood: Secondary | ICD-10-CM | POA: Diagnosis not present

## 2018-08-16 DIAGNOSIS — E440 Moderate protein-calorie malnutrition: Secondary | ICD-10-CM | POA: Diagnosis present

## 2018-08-16 DIAGNOSIS — G894 Chronic pain syndrome: Secondary | ICD-10-CM | POA: Diagnosis present

## 2018-08-16 DIAGNOSIS — Z89421 Acquired absence of other right toe(s): Secondary | ICD-10-CM

## 2018-08-16 DIAGNOSIS — E1043 Type 1 diabetes mellitus with diabetic autonomic (poly)neuropathy: Secondary | ICD-10-CM | POA: Diagnosis present

## 2018-08-16 DIAGNOSIS — Z833 Family history of diabetes mellitus: Secondary | ICD-10-CM | POA: Diagnosis not present

## 2018-08-16 DIAGNOSIS — I129 Hypertensive chronic kidney disease with stage 1 through stage 4 chronic kidney disease, or unspecified chronic kidney disease: Secondary | ICD-10-CM | POA: Diagnosis present

## 2018-08-16 DIAGNOSIS — E1065 Type 1 diabetes mellitus with hyperglycemia: Secondary | ICD-10-CM | POA: Diagnosis present

## 2018-08-16 DIAGNOSIS — M869 Osteomyelitis, unspecified: Secondary | ICD-10-CM | POA: Diagnosis present

## 2018-08-16 DIAGNOSIS — Z89422 Acquired absence of other left toe(s): Secondary | ICD-10-CM | POA: Diagnosis not present

## 2018-08-16 DIAGNOSIS — Z8614 Personal history of Methicillin resistant Staphylococcus aureus infection: Secondary | ICD-10-CM

## 2018-08-16 DIAGNOSIS — Z91018 Allergy to other foods: Secondary | ICD-10-CM | POA: Diagnosis not present

## 2018-08-16 DIAGNOSIS — N189 Chronic kidney disease, unspecified: Secondary | ICD-10-CM | POA: Diagnosis not present

## 2018-08-16 DIAGNOSIS — N178 Other acute kidney failure: Secondary | ICD-10-CM

## 2018-08-16 DIAGNOSIS — E870 Hyperosmolality and hypernatremia: Secondary | ICD-10-CM | POA: Diagnosis not present

## 2018-08-16 DIAGNOSIS — K219 Gastro-esophageal reflux disease without esophagitis: Secondary | ICD-10-CM | POA: Diagnosis present

## 2018-08-16 DIAGNOSIS — E7251 Non-ketotic hyperglycinemia: Secondary | ICD-10-CM | POA: Diagnosis present

## 2018-08-16 DIAGNOSIS — K3184 Gastroparesis: Secondary | ICD-10-CM | POA: Diagnosis present

## 2018-08-16 DIAGNOSIS — Z681 Body mass index (BMI) 19 or less, adult: Secondary | ICD-10-CM | POA: Diagnosis not present

## 2018-08-16 DIAGNOSIS — E1101 Type 2 diabetes mellitus with hyperosmolarity with coma: Secondary | ICD-10-CM | POA: Diagnosis present

## 2018-08-16 DIAGNOSIS — E1022 Type 1 diabetes mellitus with diabetic chronic kidney disease: Secondary | ICD-10-CM | POA: Diagnosis present

## 2018-08-16 DIAGNOSIS — Z79899 Other long term (current) drug therapy: Secondary | ICD-10-CM | POA: Diagnosis not present

## 2018-08-16 DIAGNOSIS — Z794 Long term (current) use of insulin: Secondary | ICD-10-CM | POA: Diagnosis not present

## 2018-08-16 DIAGNOSIS — E1100 Type 2 diabetes mellitus with hyperosmolarity without nonketotic hyperglycemic-hyperosmolar coma (NKHHC): Secondary | ICD-10-CM | POA: Diagnosis not present

## 2018-08-16 DIAGNOSIS — R739 Hyperglycemia, unspecified: Secondary | ICD-10-CM

## 2018-08-16 HISTORY — DX: Essential (primary) hypertension: I10

## 2018-08-16 LAB — URINALYSIS, COMPLETE (UACMP) WITH MICROSCOPIC
Bacteria, UA: NONE SEEN
Bilirubin Urine: NEGATIVE
Glucose, UA: >=500 mg/dL — AB
Ketones, ur: NEGATIVE mg/dL
Leukocytes,Ua: NEGATIVE
Nitrite: NEGATIVE
Protein, ur: 100 mg/dL — AB
Specific Gravity, Urine: 1.015 (ref 1.005–1.030)
Squamous Epithelial / HPF: NONE SEEN (ref 0–5)
pH: 5 (ref 5.0–8.0)

## 2018-08-16 LAB — COMPREHENSIVE METABOLIC PANEL
ALT: 15 U/L (ref 0–44)
AST: 17 U/L (ref 15–41)
Albumin: 2.6 g/dL — ABNORMAL LOW (ref 3.5–5.0)
Alkaline Phosphatase: 129 U/L — ABNORMAL HIGH (ref 38–126)
Anion gap: 9 (ref 5–15)
BUN: 33 mg/dL — ABNORMAL HIGH (ref 6–20)
CO2: 21 mmol/L — ABNORMAL LOW (ref 22–32)
Calcium: 8.1 mg/dL — ABNORMAL LOW (ref 8.9–10.3)
Chloride: 93 mmol/L — ABNORMAL LOW (ref 98–111)
Creatinine, Ser: 2.43 mg/dL — ABNORMAL HIGH (ref 0.61–1.24)
GFR calc Af Amer: 40 mL/min — ABNORMAL LOW (ref >60)
GFR calc non Af Amer: 34 mL/min — ABNORMAL LOW (ref >60)
Glucose, Bld: 738 mg/dL (ref 70–99)
Potassium: 3.6 mmol/L (ref 3.5–5.1)
Sodium: 123 mmol/L — ABNORMAL LOW (ref 135–145)
Total Bilirubin: 0.8 mg/dL (ref 0.3–1.2)
Total Protein: 6.3 g/dL — ABNORMAL LOW (ref 6.5–8.1)

## 2018-08-16 LAB — GLUCOSE, CAPILLARY
GLUCOSE-CAPILLARY: 231 mg/dL — AB (ref 70–99)
Glucose-Capillary: >600 mg/dL (ref 70–99)
Glucose-Capillary: 433 mg/dL — ABNORMAL HIGH (ref 70–99)
Glucose-Capillary: 400 mg/dL — ABNORMAL HIGH (ref 70–99)
Glucose-Capillary: 310 mg/dL — ABNORMAL HIGH (ref 70–99)
Glucose-Capillary: 170 mg/dL — ABNORMAL HIGH (ref 70–99)
Glucose-Capillary: 117 mg/dL — ABNORMAL HIGH (ref 70–99)
Glucose-Capillary: 184 mg/dL — ABNORMAL HIGH (ref 70–99)
Glucose-Capillary: 238 mg/dL — ABNORMAL HIGH (ref 70–99)

## 2018-08-16 LAB — BASIC METABOLIC PANEL
Anion gap: 7 (ref 5–15)
BUN: 30 mg/dL — ABNORMAL HIGH (ref 6–20)
CO2: 19 mmol/L — ABNORMAL LOW (ref 22–32)
Calcium: 8.1 mg/dL — ABNORMAL LOW (ref 8.9–10.3)
Chloride: 105 mmol/L (ref 98–111)
Creatinine, Ser: 2.13 mg/dL — ABNORMAL HIGH (ref 0.61–1.24)
GFR calc Af Amer: 47 mL/min — ABNORMAL LOW (ref >60)
GFR calc non Af Amer: 40 mL/min — ABNORMAL LOW (ref >60)
Glucose, Bld: 295 mg/dL — ABNORMAL HIGH (ref 70–99)
Potassium: 3.5 mmol/L (ref 3.5–5.1)
SODIUM: 131 mmol/L — AB (ref 135–145)

## 2018-08-16 LAB — CBC WITH DIFFERENTIAL/PLATELET
Abs Immature Granulocytes: 0.03 10*3/uL (ref 0.00–0.07)
Basophils Absolute: 0 10*3/uL (ref 0.0–0.1)
Basophils Relative: 0 %
Eosinophils Absolute: 0.1 10*3/uL (ref 0.0–0.5)
Eosinophils Relative: 1 %
HEMATOCRIT: 28.9 % — AB (ref 39.0–52.0)
Hemoglobin: 9.9 g/dL — ABNORMAL LOW (ref 13.0–17.0)
Immature Granulocytes: 0 %
LYMPHS ABS: 1.5 10*3/uL (ref 0.7–4.0)
Lymphocytes Relative: 16 %
MCH: 27.3 pg (ref 26.0–34.0)
MCHC: 34.3 g/dL (ref 30.0–36.0)
MCV: 79.8 fL — ABNORMAL LOW (ref 80.0–100.0)
Monocytes Absolute: 0.9 10*3/uL (ref 0.1–1.0)
Monocytes Relative: 10 %
Neutro Abs: 6.7 10*3/uL (ref 1.7–7.7)
Neutrophils Relative %: 73 %
Platelets: 368 10*3/uL (ref 150–400)
RBC: 3.62 MIL/uL — ABNORMAL LOW (ref 4.22–5.81)
RDW: 13.3 % (ref 11.5–15.5)
WBC: 9.3 10*3/uL (ref 4.0–10.5)
nRBC: 0 % (ref 0.0–0.2)

## 2018-08-16 LAB — MRSA PCR SCREENING: MRSA by PCR: POSITIVE — AB

## 2018-08-16 LAB — LIPASE, BLOOD: Lipase: 98 U/L — ABNORMAL HIGH (ref 11–51)

## 2018-08-16 LAB — OSMOLALITY: Osmolality: 315 mOsm/kg — ABNORMAL HIGH (ref 275–295)

## 2018-08-16 MED ORDER — POTASSIUM CHLORIDE CRYS ER 10 MEQ PO TBCR
10.0000 meq | EXTENDED_RELEASE_TABLET | Freq: Every day | ORAL | Status: DC
  Administered 2018-08-16 – 2018-08-18 (×3): 10 meq via ORAL
  Filled 2018-08-16 (×3): qty 1

## 2018-08-16 MED ORDER — METOCLOPRAMIDE HCL 5 MG/ML IJ SOLN
10.0000 mg | Freq: Three times a day (TID) | INTRAMUSCULAR | Status: DC
  Administered 2018-08-16 – 2018-08-18 (×5): 10 mg via INTRAVENOUS
  Filled 2018-08-16 (×5): qty 2

## 2018-08-16 MED ORDER — SODIUM CHLORIDE 0.9% FLUSH
3.0000 mL | Freq: Two times a day (BID) | INTRAVENOUS | Status: DC
  Administered 2018-08-16 – 2018-08-17 (×4): 3 mL via INTRAVENOUS

## 2018-08-16 MED ORDER — ACETAMINOPHEN 650 MG RE SUPP: 650.0000 mg | Freq: Four times a day (QID) | RECTAL | Status: DC | PRN

## 2018-08-16 MED ORDER — ALPRAZOLAM 0.25 MG PO TABS
0.2500 mg | ORAL_TABLET | Freq: Three times a day (TID) | ORAL | Status: DC | PRN
  Administered 2018-08-16 – 2018-08-18 (×5): 0.25 mg via ORAL
  Filled 2018-08-16 (×5): qty 1

## 2018-08-16 MED ORDER — POLYETHYLENE GLYCOL 3350 17 G PO PACK: 17.0000 g | PACK | Freq: Every day | ORAL | Status: DC | PRN

## 2018-08-16 MED ORDER — INSULIN ASPART 100 UNIT/ML ~~LOC~~ SOLN
10.0000 [IU] | Freq: Once | SUBCUTANEOUS | Status: AC
  Administered 2018-08-16: 10 [IU] via INTRAVENOUS
  Filled 2018-08-16: qty 1

## 2018-08-16 MED ORDER — ONDANSETRON HCL 4 MG PO TABS
4.0000 mg | ORAL_TABLET | Freq: Four times a day (QID) | ORAL | Status: DC | PRN
  Administered 2018-08-16: 4 mg via ORAL
  Filled 2018-08-16: qty 1

## 2018-08-16 MED ORDER — SODIUM CHLORIDE 0.9 % IV SOLN
INTRAVENOUS | Status: DC
  Administered 2018-08-16: 13:00:00 via INTRAVENOUS

## 2018-08-16 MED ORDER — ACETAMINOPHEN 500 MG PO TABS
ORAL_TABLET | ORAL | Status: AC
  Administered 2018-08-16: 1000 mg via ORAL
  Filled 2018-08-16: qty 2

## 2018-08-16 MED ORDER — INSULIN REGULAR(HUMAN) IN NACL 100-0.9 UT/100ML-% IV SOLN
INTRAVENOUS | Status: DC
  Administered 2018-08-16: 3.7 [IU]/h via INTRAVENOUS
  Filled 2018-08-16: qty 100

## 2018-08-16 MED ORDER — SODIUM CHLORIDE 0.9 % IV BOLUS
1000.0000 mL | Freq: Once | INTRAVENOUS | Status: AC
  Administered 2018-08-16: 1000 mL via INTRAVENOUS

## 2018-08-16 MED ORDER — SODIUM CHLORIDE 0.9 % IV SOLN
INTRAVENOUS | Status: DC
  Administered 2018-08-16 – 2018-08-18 (×3): via INTRAVENOUS

## 2018-08-16 MED ORDER — OXYCODONE-ACETAMINOPHEN 7.5-325 MG PO TABS
1.0000 | ORAL_TABLET | ORAL | Status: DC | PRN
  Administered 2018-08-16 – 2018-08-18 (×13): 1 via ORAL
  Filled 2018-08-16 (×13): qty 1

## 2018-08-16 MED ORDER — POTASSIUM CHLORIDE 10 MEQ/100ML IV SOLN
10.0000 meq | INTRAVENOUS | Status: AC
  Administered 2018-08-16 (×2): 10 meq via INTRAVENOUS
  Filled 2018-08-16 (×4): qty 100

## 2018-08-16 MED ORDER — SODIUM CHLORIDE 0.9 % IV BOLUS: 1000.0000 mL | Freq: Once | INTRAVENOUS | Status: DC

## 2018-08-16 MED ORDER — DEXTROSE-NACL 5-0.45 % IV SOLN: INTRAVENOUS | Status: DC

## 2018-08-16 MED ORDER — ACETAMINOPHEN 325 MG PO TABS: 650.0000 mg | ORAL_TABLET | Freq: Four times a day (QID) | ORAL | Status: DC | PRN

## 2018-08-16 MED ORDER — INSULIN GLARGINE 100 UNIT/ML ~~LOC~~ SOLN
24.0000 [IU] | SUBCUTANEOUS | Status: DC
  Administered 2018-08-16: 24 [IU] via SUBCUTANEOUS
  Filled 2018-08-16 (×2): qty 0.24

## 2018-08-16 MED ORDER — INSULIN ASPART 100 UNIT/ML ~~LOC~~ SOLN
0.0000 [IU] | Freq: Every day | SUBCUTANEOUS | Status: DC
  Administered 2018-08-16 – 2018-08-17 (×2): 2 [IU] via SUBCUTANEOUS
  Filled 2018-08-16 (×2): qty 1

## 2018-08-16 MED ORDER — INSULIN ASPART 100 UNIT/ML ~~LOC~~ SOLN
0.0000 [IU] | Freq: Three times a day (TID) | SUBCUTANEOUS | Status: DC
  Administered 2018-08-17: 2 [IU] via SUBCUTANEOUS
  Administered 2018-08-17: 5 [IU] via SUBCUTANEOUS
  Administered 2018-08-17: 2 [IU] via SUBCUTANEOUS
  Administered 2018-08-18: 13:00:00 1 [IU] via SUBCUTANEOUS
  Administered 2018-08-18: 08:00:00 3 [IU] via SUBCUTANEOUS
  Filled 2018-08-16 (×5): qty 1

## 2018-08-16 MED ORDER — HEPARIN SODIUM (PORCINE) 5000 UNIT/ML IJ SOLN
5000.0000 [IU] | Freq: Three times a day (TID) | INTRAMUSCULAR | Status: DC
  Administered 2018-08-16 – 2018-08-18 (×7): 5000 [IU] via SUBCUTANEOUS
  Filled 2018-08-16 (×7): qty 1

## 2018-08-16 MED ORDER — INSULIN ASPART 100 UNIT/ML ~~LOC~~ SOLN
3.0000 [IU] | Freq: Three times a day (TID) | SUBCUTANEOUS | Status: DC
  Administered 2018-08-17 (×3): 3 [IU] via SUBCUTANEOUS
  Filled 2018-08-16 (×3): qty 1

## 2018-08-16 MED ORDER — HYDRALAZINE HCL 20 MG/ML IJ SOLN: 10.0000 mg | Freq: Four times a day (QID) | INTRAMUSCULAR | Status: DC | PRN

## 2018-08-16 MED ORDER — INSULIN REGULAR(HUMAN) IN NACL 100-0.9 UT/100ML-% IV SOLN: INTRAVENOUS | Status: DC

## 2018-08-16 MED ORDER — ZOLPIDEM TARTRATE 5 MG PO TABS
10.0000 mg | ORAL_TABLET | Freq: Every evening | ORAL | Status: DC | PRN
  Administered 2018-08-16 – 2018-08-17 (×2): 10 mg via ORAL
  Filled 2018-08-16 (×2): qty 2

## 2018-08-16 MED ORDER — ACETAMINOPHEN 500 MG PO TABS
1000.0000 mg | ORAL_TABLET | Freq: Once | ORAL | Status: AC
  Administered 2018-08-16: 1000 mg via ORAL

## 2018-08-16 MED ORDER — DEXTROSE-NACL 5-0.45 % IV SOLN
INTRAVENOUS | Status: DC
  Administered 2018-08-16: 12:00:00 via INTRAVENOUS

## 2018-08-16 MED ORDER — ONDANSETRON HCL 4 MG/2ML IJ SOLN: 4.0000 mg | Freq: Four times a day (QID) | INTRAMUSCULAR | Status: DC | PRN

## 2018-08-16 MED ORDER — ACETAMINOPHEN 500 MG PO TABS: 1000.0000 mg | ORAL_TABLET | Freq: Once | ORAL | Status: DC

## 2018-08-16 MED ORDER — PROMETHAZINE HCL 25 MG/ML IJ SOLN
25.0000 mg | Freq: Once | INTRAMUSCULAR | Status: AC
  Administered 2018-08-16: 25 mg via INTRAVENOUS
  Filled 2018-08-16: qty 1

## 2018-08-16 MED ORDER — LISINOPRIL 10 MG PO TABS: 20.0000 mg | ORAL_TABLET | Freq: Two times a day (BID) | ORAL | Status: DC

## 2018-08-16 MED ORDER — ALBUTEROL SULFATE (2.5 MG/3ML) 0.083% IN NEBU: 2.5000 mg | INHALATION_SOLUTION | RESPIRATORY_TRACT | Status: DC | PRN

## 2018-08-16 MED ORDER — PROMETHAZINE HCL 25 MG/ML IJ SOLN
25.0000 mg | Freq: Four times a day (QID) | INTRAMUSCULAR | Status: DC | PRN
  Administered 2018-08-16 – 2018-08-18 (×6): 25 mg via INTRAVENOUS
  Filled 2018-08-16 (×6): qty 1

## 2018-08-16 NOTE — Consult Note (Signed)
PHARMACY CONSULT NOTE - FOLLOW UP  Pharmacy Consult for Electrolyte Monitoring and Replacement   Recent Labs: Potassium (mmol/L)  Date Value  08/16/2018 3.5  10/06/2014 3.4 (L)   Magnesium (mg/dL)  Date Value  07/17/2018 1.9   Calcium (mg/dL)  Date Value  08/16/2018 8.1 (L)   Calcium, Total (mg/dL)  Date Value  10/06/2014 10.0   Albumin (g/dL)  Date Value  08/16/2018 2.6 (L)  08/14/2018 3.6 (L)  10/06/2014 5.3 (H)   Phosphorus (mg/dL)  Date Value  11/11/2017 2.5   Sodium (mmol/L)  Date Value  08/16/2018 131 (L)  08/14/2018 128 (L)  10/06/2014 132 (L)    Assessment: Pharmacy consulted for electrolyte monitoring and replacement for 33 yo male admitted with Hyperosmolar hyperglycemic state.   Plan:  2/12 K: 3.6 on admission. KCL 59mEq PO x 1 and KCL 72mEq IV x 2 doses ordered. No additional replacement needed at this time. BMPs ordered every 4 hours x 3 occurrences.   2/12 @ 1600 K - 3.5, no additional replacement needed at this time.  Next lab @ 2000  Forrest Moron, PharmD Clinical Pharmacist 08/16/2018 5:59 PM

## 2018-08-16 NOTE — H&P (Signed)
[facility] NAME: Shawn [name]    MR#:  [id number]  DATE OF BIRTH:  [birth year]  DATE OF ADMISSION:  08/16/2018  PRIMARY CARE PHYSICIAN: Valera Castle, MD   REQUESTING/REFERRING PHYSICIAN: Dr. Quentin Cornwall  CHIEF COMPLAINT:   Chief Complaint  Patient presents with  . Abdominal Pain  . Hyperglycemia  . Emesis    HISTORY OF PRESENT ILLNESS:  Shawn [name]  is a 33 y.o. male with a known history of diabetes mellitus type 1, hypertension, CKD stage III, bilateral toe amputations, gastroparesis, chronic abdominal pain presents to the emergency room complaining of intractable nausea, vomiting, abdominal pain and hyperglycemia.  Patient's blood sugars are 800 in the emergency room.  His blood sugars were 622 at pain management clinic 2 days back on reviewing lab work.  He is not in DKA.  But has hyperglycemic hyperosmolar state.  Patient did take his Lantus 35 units along with 5 units of NovoLog yesterday night with no improvement.  Will be admitted to ICU for insulin drip. Patient has had recurrent admissions for DKA in the past. He has chronic diarrhea that is unchanged  PAST MEDICAL HISTORY:   Past Medical History:  Diagnosis Date  . CKD (chronic kidney disease)   . Diabetes mellitus without complication ([place])   . GERD (gastroesophageal reflux disease)   . HTN (hypertension)   . IBS (irritable bowel syndrome)   . Osteomyelitis ([place])     PAST SURGICAL HISTORY:   Past Surgical History:  Procedure Laterality Date  . ABDOMINAL AORTOGRAM W/LOWER EXTREMITY Right 12/23/2017   Procedure: ABDOMINAL AORTOGRAM W/LOWER EXTREMITY;  Surgeon: Katha Cabal, MD;  Location: [facility];  Service: Cardiovascular;  Laterality: Right;  . ACHILLES TENDON SURGERY Bilateral 06/16/2018   Procedure: ACHILLES LENGTHENING/KIDNER;  Surgeon: Albertine Patricia, DPM;  Location: [facility];  Service: Podiatry;  Laterality: Bilateral;  .  AMPUTATION Right 12/24/2017   Procedure: AMPUTATION RAY;  Surgeon: Sharlotte Alamo, DPM;  Location: [facility];  Service: Podiatry;  Laterality: Right;  . AMPUTATION Left 04/06/2018   Procedure: AMPUTATION RAY;  Surgeon: Sharlotte Alamo, DPM;  Location: [facility];  Service: Podiatry;  Laterality: Left;  . AMPUTATION Left 04/09/2018   Procedure: AMPUTATION RAY;  Surgeon: Sharlotte Alamo, DPM;  Location: [facility];  Service: Podiatry;  Laterality: Left;  . APPLICATION OF WOUND VAC Right 12/24/2017   Procedure: APPLICATION OF WOUND VAC;  Surgeon: Sharlotte Alamo, DPM;  Location: [facility];  Service: Podiatry;  Laterality: Right;  . APPLICATION OF WOUND VAC Right 01/01/2018   Procedure: APPLICATION OF WOUND VAC;  Surgeon: Albertine Patricia, DPM;  Location: [facility];  Service: Podiatry;  Laterality: Right;  . BONE EXCISION Bilateral 06/16/2018   Procedure: BONE EXCISION AND SOFT TISSUE;  Surgeon: Albertine Patricia, DPM;  Location: [facility];  Service: Podiatry;  Laterality: Bilateral;  . IRRIGATION AND DEBRIDEMENT FOOT Right 12/20/2017   Procedure: IRRIGATION AND DEBRIDEMENT FOOT;  Surgeon: Sharlotte Alamo, DPM;  Location: [facility];  Service: Podiatry;  Laterality: Right;  . IRRIGATION AND DEBRIDEMENT FOOT Right 12/24/2017   Procedure: IRRIGATION AND DEBRIDEMENT FOOT;  Surgeon: Sharlotte Alamo, DPM;  Location: [facility];  Service: Podiatry;  Laterality: Right;  . IRRIGATION AND DEBRIDEMENT FOOT Right 01/01/2018   Procedure: IRRIGATION AND DEBRIDEMENT FOOT-SKIN,SOFT TISSUE AND BONE;  Surgeon: Albertine Patricia, DPM;  Location: [facility];  Service: Podiatry;  Laterality: Right;  . IRRIGATION AND DEBRIDEMENT FOOT Left 04/06/2018   Procedure: IRRIGATION AND DEBRIDEMENT FOOT;  Surgeon: Sharlotte Alamo, DPM;  Location: [facility];  Service: Podiatry;  Laterality: Left;  . LOWER EXTREMITY ANGIOGRAPHY Left 04/06/2018   Procedure: Lower Extremity Angiography;  Surgeon: Algernon Huxley, MD;  Location: [facility];  Service: Cardiovascular;  Laterality:  Left;    SOCIAL HISTORY:   Social History   Tobacco Use  . Smoking status: Never Smoker  . Smokeless tobacco: Never Used  Substance Use Topics  . Alcohol use: No    Frequency: Never    FAMILY HISTORY:   Family History  Problem Relation Age of Onset  . Diabetes Mother   . Ovarian cancer Mother   . Healthy Father     DRUG ALLERGIES:   Allergies  Allergen Reactions  . Banana Hives, Nausea And Vomiting and Rash  . Keflex [Cephalexin] Rash    No swelling- also taken penicillin without any issue.  . Onion Hives, Nausea And Vomiting and Rash  . Sulfa Antibiotics Anaphylaxis  . Grapeseed Extract [Nutritional Supplements] Itching  . Shellfish Allergy Hives    "ALL SEAFOOD"  . Grape Seed Rash    REVIEW OF SYSTEMS:   Review of Systems  Constitutional: Positive for malaise/fatigue. Negative for chills and fever.  HENT: Negative for sore throat.   Eyes: Negative for blurred vision, double vision and pain.  Respiratory: Negative for cough, hemoptysis, shortness of breath and wheezing.   Cardiovascular: Negative for chest pain, palpitations, orthopnea and leg swelling.  Gastrointestinal: Positive for abdominal pain, diarrhea, nausea and vomiting. Negative for constipation and heartburn.  Genitourinary: Negative for dysuria and hematuria.  Musculoskeletal: Positive for myalgias. Negative for back pain and joint pain.  Skin: Negative for rash.  Neurological: Negative for sensory change, speech change, focal weakness and headaches.  Endo/Heme/Allergies: Does not bruise/bleed easily.  Psychiatric/Behavioral: Negative for depression. The patient is nervous/anxious.     MEDICATIONS AT HOME:   Prior to Admission medications   Medication Sig Start Date End Date Taking? Authorizing Provider  ALPRAZolam (XANAX) 0.25 MG tablet Take 1 tablet (0.25 mg total) by mouth 3 (three) times daily as needed for anxiety. 06/20/18   Gladstone Lighter, MD  ferrous sulfate 325 (65 FE) MG tablet  Take 1 tablet (325 mg total) by mouth 2 (two) times daily with a meal. 01/03/18   Sainani, Belia Heman, MD  hydrALAZINE (APRESOLINE) 25 MG tablet Take 1 tablet (25 mg total) by mouth every 8 (eight) hours. 06/20/18   Gladstone Lighter, MD  insulin glargine (LANTUS) 100 UNIT/ML injection Inject 0.25 mLs (25 Units total) into the skin at bedtime. 06/20/18   Gladstone Lighter, MD  Insulin Syringes, Disposable, U-100 0.3 ML MISC 1 Syringe by Does not apply route 4 (four) times daily -  with meals and at bedtime. 12/27/17   Loletha Grayer, MD  lisinopril (PRINIVIL,ZESTRIL) 20 MG tablet Take 20 mg by mouth daily.    [provider]  NOVOLIN R RELION 100 UNIT/ML injection Inject 0-0.1 mLs (0-10 Units total) into the skin 3 (three) times daily as needed for high blood sugar (sliding scale). Patient taking differently: Inject 2 Units into the skin 3 (three) times daily as needed for high blood sugar (sliding scale). Depending on sugar level, pt will inject 5-10 units. 05/02/18 07/26/18  Loletha Grayer, MD  potassium chloride (K-DUR) 10 MEQ tablet Take 10 mEq by mouth daily.    [provider]  vitamin C (VITAMIN C) 250 MG tablet Take 1 tablet (250 mg total) by mouth 2 (two) times daily. 06/20/18  Gladstone Lighter, MD  zolpidem (AMBIEN) 10 MG tablet Take 10 mg by mouth at bedtime as needed for sleep.    [provider]     VITAL SIGNS:  Blood pressure (!) 151/112, pulse 97, temperature 97.6 F (36.4 C), temperature source Oral, resp. rate (!) 23, height 5\' 6"  (1.676 m), weight 52.2 kg, SpO2 100 %.  PHYSICAL EXAMINATION:  Physical Exam  GENERAL:  33 y.o.-year-old patient lying in the bed with distress due to pain EYES: Pupils equal, round, reactive to light and accommodation. No scleral icterus. Extraocular muscles intact.  HEENT: Head atraumatic, normocephalic. Oropharynx and nasopharynx clear. No oropharyngeal erythema, dry oral mucosa  NECK:  Supple, no jugular venous  distention. No thyroid enlargement, no tenderness.  LUNGS: Normal breath sounds bilaterally, no wheezing, rales, rhonchi. No use of accessory muscles of respiration.  CARDIOVASCULAR: S1, S2 normal. No murmurs, rubs, or gallops.  ABDOMEN: Soft, diffuse tenderness, nondistended. Bowel sounds present. No organomegaly or mass.  No rigidity or guarding EXTREMITIES: No pedal edema, cyanosis, or clubbing. + 2 pedal & radial pulses b/l.   NEUROLOGIC: Cranial nerves II through XII are intact. No focal Motor or sensory deficits appreciated b/l PSYCHIATRIC: The patient is alert and oriented x 3. Good affect.  SKIN: No obvious rash, lesion, or ulcer.   LABORATORY PANEL:   CBC Recent Labs  Lab 08/16/18 0901  WBC 9.3  HGB 9.9*  HCT 28.9*  PLT 368   ------------------------------------------------------------------------------------------------------------------  Chemistries  Recent Labs  Lab 08/16/18 0901  NA 123*  K 3.6  CL 93*  CO2 21*  GLUCOSE 738*  BUN 33*  CREATININE 2.43*  CALCIUM 8.1*  AST 17  ALT 15  ALKPHOS 129*  BILITOT 0.8   ------------------------------------------------------------------------------------------------------------------  Cardiac Enzymes No results for input(s): TROPONINI in the last 168 hours. ------------------------------------------------------------------------------------------------------------------  RADIOLOGY:  No results found.   IMPRESSION AND PLAN:   *Hyper osmolar hyperglycemic state with pseudohyponatremia and acute kidney injury Patient will be admitted with insulin drip.  Bolus normal saline stat.  Start normal saline maintenance fluids after that.  Change to D5 normal saline.  Repeat BMP.  Every hour Accu-Cheks.  Admit to stepdown unit.  Critically ill. We will check hemoglobin A1c. He is on 35 units Lantus at night along with NovoLog 5 units with meals.  We will start him on at least 40 units of Lantus if he is able to tolerate  food once his blood sugars improved.  Will need adjustment of home insulin doses at discharge.  *Hypertension.  Continue home medications  *Acute kidney injury over CKD stage III.  See eval  *Gastroparesis with chronic abdominal pain.  Will continue his Percocet from home.  Put him on scheduled Reglan.  Zofran as needed.  *DVT prophylaxis with heparin  All the records are reviewed and case discussed with ED provider. Management plans discussed with the patient, family and they are in agreement.  CODE STATUS: Full code  TOTAL CRITICAL CARE TIME TAKING CARE OF THIS PATIENT: 40 minutes.   Leia Alf Corinthia Helmers M.D on 08/16/2018 at 12:05 PM  Between 7am to 6pm - Pager - [phone]  After 6pm go to [email or web address] - password [email or web address] [facility]  Office  [phone]  CC: Primary care physician; Valera Castle, MD  Note: This dictation was prepared with Dragon dictation along with smaller phrase technology. Any transcriptional errors that result from this process are unintentional.

## 2018-08-16 NOTE — ED Triage Notes (Signed)
Pt presents to ED via POV with c/o abdominal pain, emesis, hyperglycemia x 3 days. Pt reports is type 1 diabetic, reports that CBGs at home have been 400+. Pt reports SOB with movement at this time.

## 2018-08-16 NOTE — Consult Note (Signed)
PHARMACY CONSULT NOTE - FOLLOW UP  Pharmacy Consult for Electrolyte Monitoring and Replacement   Recent Labs: Potassium (mmol/L)  Date Value  08/16/2018 3.6  10/06/2014 3.4 (L)   Magnesium (mg/dL)  Date Value  07/17/2018 1.9   Calcium (mg/dL)  Date Value  08/16/2018 8.1 (L)   Calcium, Total (mg/dL)  Date Value  10/06/2014 10.0   Albumin (g/dL)  Date Value  08/16/2018 2.6 (L)  08/14/2018 3.6 (L)  10/06/2014 5.3 (H)   Phosphorus (mg/dL)  Date Value  11/11/2017 2.5   Sodium (mmol/L)  Date Value  08/16/2018 123 (L)  08/14/2018 128 (L)  10/06/2014 132 (L)    Assessment: Pharmacy consulted for electrolyte monitoring and replacement for 33 yo male admitted with Hyperosmolar hyperglycemic state.   Plan:  2/12 K: 3.6 on admission. KCL 30mEq PO x 1 and KCL 55mEq IV x 2 doses ordered. No additional replacement needed at this time. BMPs ordered every 4 hours x 3 occurrences.   Next lab @ [facility], PharmD, BCPS Clinical Pharmacist 08/16/2018 2:06 PM

## 2018-08-16 NOTE — ED Notes (Signed)
Pt noted to be noticeably thinner since last visit to ED with this assigned RN.

## 2018-08-16 NOTE — Progress Notes (Signed)
Inpatient Diabetes Program Recommendations  AACE/ADA: New Consensus Statement on Inpatient Glycemic Control (2015)  Target Ranges:  Prepandial:   less than 140 mg/dL      Peak postprandial:   less than 180 mg/dL (1-2 hours)      Critically ill patients:  140 - 180 mg/dL   Results for Shawn [name], Shawn [name] (MRN [id number]) as of 08/16/2018 14:13  Ref. Range 08/16/2018 08:49 08/16/2018 12:10 08/16/2018 12:55 08/16/2018 13:59  Glucose-Capillary Latest Ref Range: 70 - 99 mg/dL >600 (HH) 433 (H) 400 (H) 310 (H)  Results for Shawn [name], Shawn [name] (MRN [id number]) as of 08/16/2018 14:13  Ref. Range 08/16/2018 09:01  Glucose Latest Ref Range: 70 - 99 mg/dL 738 (HH)   Review of Glycemic Control  Diabetes history:DM1 (makes NO insulin; requires basal, correction, and meal coverage insulin) Outpatient Diabetes medications:Lantus 25units QHS, Regular2 units with meals, plus additional units if glucose is elevated TID Current orders for Inpatient glycemic control:IV insulin drip  Inpatient Diabetes Program Recommendations:  Insulin at transition from IV to SQ: Once MD is ready to transition from IV to SQ insulin, please consider ordering Lantus to 24 units Q24H, Novolog 0-9 units Q4H, and if diet is ordered, please consider ordering Novolog 3 units TID with meals for meal coverage if patient eats at least 50% of meals.  NOTE: Patient has history of Type 1 DM and is well known to Inpatient Diabetes team. This is patient's 6th admission in the past 4 months. Noted patient noted to have [place] this admission (instead of DKA) with initial glucose of 738 mg/dl. Patient is currently ordered IV insulin and most current glucose 310 mg/dl at 13:59 today.   Thanks, [name], RN, MSN, CDE Diabetes Coordinator Inpatient Diabetes Program [phone] (Team Pager from 8am to 5pm)

## 2018-08-16 NOTE — ED Provider Notes (Addendum)
[facility] Emergency Department Provider Note    First MD Initiated Contact with Patient 08/16/18 [phone]     (approximate)  I have reviewed the triage vital signs and the nursing notes.   HISTORY  Chief Complaint Abdominal Pain; Hyperglycemia; and Emesis    HPI Shawn [name] is a 33 y.o. male 1 in his facility with poorly controlled diabetes status post bilateral BKA presents the ER for nausea vomiting elevated blood sugars.  Also having crampy abdominal pain feeling similar to previous bouts with gastroparesis.  Denies any bloody stool or bloody emesis.  No shortness of breath or chest pain.  No measured fevers at home.    Past Medical History:  Diagnosis Date  . CKD (chronic kidney disease)   . Diabetes mellitus without complication ([place])   . GERD (gastroesophageal reflux disease)   . IBS (irritable bowel syndrome)   . Osteomyelitis ([place])    Family History  Problem Relation Age of Onset  . Diabetes Mother   . Ovarian cancer Mother   . Healthy Father    Past Surgical History:  Procedure Laterality Date  . ABDOMINAL AORTOGRAM W/LOWER EXTREMITY Right 12/23/2017   Procedure: ABDOMINAL AORTOGRAM W/LOWER EXTREMITY;  Surgeon: Katha Cabal, MD;  Location: [place] [facility];  Service: Cardiovascular;  Laterality: Right;  . ACHILLES TENDON SURGERY Bilateral 06/16/2018   Procedure: ACHILLES LENGTHENING/KIDNER;  Surgeon: Albertine Patricia, DPM;  Location: [facility];  Service: Podiatry;  Laterality: Bilateral;  . AMPUTATION Right 12/24/2017   Procedure: AMPUTATION RAY;  Surgeon: Sharlotte Alamo, DPM;  Location: [facility];  Service: Podiatry;  Laterality: Right;  . AMPUTATION Left 04/06/2018   Procedure: AMPUTATION RAY;  Surgeon: Sharlotte Alamo, DPM;  Location: [facility];  Service: Podiatry;  Laterality: Left;  . AMPUTATION Left 04/09/2018   Procedure: AMPUTATION RAY;  Surgeon: Sharlotte Alamo, DPM;  Location: [facility];  Service: Podiatry;  Laterality: Left;  .  APPLICATION OF WOUND VAC Right 12/24/2017   Procedure: APPLICATION OF WOUND VAC;  Surgeon: Sharlotte Alamo, DPM;  Location: [facility];  Service: Podiatry;  Laterality: Right;  . APPLICATION OF WOUND VAC Right 01/01/2018   Procedure: APPLICATION OF WOUND VAC;  Surgeon: Albertine Patricia, DPM;  Location: [facility];  Service: Podiatry;  Laterality: Right;  . BONE EXCISION Bilateral 06/16/2018   Procedure: BONE EXCISION AND SOFT TISSUE;  Surgeon: Albertine Patricia, DPM;  Location: [facility];  Service: Podiatry;  Laterality: Bilateral;  . IRRIGATION AND DEBRIDEMENT FOOT Right 12/20/2017   Procedure: IRRIGATION AND DEBRIDEMENT FOOT;  Surgeon: Sharlotte Alamo, DPM;  Location: [facility];  Service: Podiatry;  Laterality: Right;  . IRRIGATION AND DEBRIDEMENT FOOT Right 12/24/2017   Procedure: IRRIGATION AND DEBRIDEMENT FOOT;  Surgeon: Sharlotte Alamo, DPM;  Location: [facility];  Service: Podiatry;  Laterality: Right;  . IRRIGATION AND DEBRIDEMENT FOOT Right 01/01/2018   Procedure: IRRIGATION AND DEBRIDEMENT FOOT-SKIN,SOFT TISSUE AND BONE;  Surgeon: Albertine Patricia, DPM;  Location: [facility];  Service: Podiatry;  Laterality: Right;  . IRRIGATION AND DEBRIDEMENT FOOT Left 04/06/2018   Procedure: IRRIGATION AND DEBRIDEMENT FOOT;  Surgeon: Sharlotte Alamo, DPM;  Location: [facility];  Service: Podiatry;  Laterality: Left;  . LOWER EXTREMITY ANGIOGRAPHY Left 04/06/2018   Procedure: Lower Extremity Angiography;  Surgeon: Algernon Huxley, MD;  Location: [place] [facility];  Service: Cardiovascular;  Laterality: Left;   Patient Active Problem List   Diagnosis Date Noted  . Chronic foot pain (Primary Area of Pain) (Bilateral) (L>R) 08/14/2018  . Chronic pain  of both lower extremities (Secondary Area of Pain) (L>R) 08/14/2018  . Chronic generalized abdominal pain [facility] Area of Pain) 08/14/2018  . Chronic pain syndrome 08/14/2018  . Long term current use of opiate analgesic 08/14/2018  . Pharmacologic therapy 08/14/2018  . Disorder of  skeletal system 08/14/2018  . Problems influencing health status 08/14/2018  . Nausea & vomiting 07/16/2018  . IV infusion line dysfunction ([place]) 07/09/2018  . Hyperglycemic hyperosmolar nonketotic coma ([place]) 05/28/2018  . Acute renal failure (ARF) ([place]) 04/27/2018  . Left foot infection 04/05/2018  . Foot infection 02/06/2018  . Local infection of the skin and subcutaneous tissue, unspecified 02/06/2018  . Hypertension associated with diabetes ([place]) 01/16/2018  . Osteomyelitis ([place]) 01/10/2018  . Cellulitis 12/29/2017  . Sepsis ([place]) 12/18/2017  . Moderate recurrent major depression ([place]) 11/10/2017  . Type 2 diabetes mellitus with hyperosmolar nonketotic hyperglycemia ([place]) 11/09/2017  . History of migraine 07/15/2017  . IBS (irritable bowel syndrome) 07/15/2017  . Protein-calorie malnutrition, severe 07/14/2017  . Abdominal pain 07/13/2017  . Diarrhea 06/01/2013  . 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 GERD (gastroesophageal reflux disease) 12/30/2009  . Type 1 diabetes mellitus with diabetic polyneuropathy ([place]) 11/25/2009  . MDD (major depressive disorder) 11/25/2009  . Hypercholesterolemia 03/07/2008      Prior to Admission medications   Medication Sig Start Date End Date Taking? Authorizing Provider  ALPRAZolam (XANAX) 0.25 MG tablet Take 1 tablet (0.25 mg total) by mouth 3 (three) times daily as needed for anxiety. 06/20/18   Gladstone Lighter, MD  ferrous sulfate 325 (65 FE) MG tablet Take 1 tablet (325 mg total) by mouth 2 (two) times daily with a meal. 01/03/18   Sainani, Belia Heman, MD  hydrALAZINE (APRESOLINE) 25 MG tablet Take 1 tablet (25 mg total) by mouth every 8 (eight) hours. 06/20/18   Gladstone Lighter, MD  insulin glargine (LANTUS) 100 UNIT/ML injection Inject 0.25 mLs (25 Units total) into the skin at bedtime. 06/20/18   Gladstone Lighter, MD  Insulin Syringes, Disposable, U-100 0.3 ML MISC 1 Syringe by Does not apply route 4 (four) times daily -  with meals and  at bedtime. 12/27/17   Loletha Grayer, MD  lisinopril (PRINIVIL,ZESTRIL) 20 MG tablet Take 20 mg by mouth daily.    [provider]  NOVOLIN R RELION 100 UNIT/ML injection Inject 0-0.1 mLs (0-10 Units total) into the skin 3 (three) times daily as needed for high blood sugar (sliding scale). Patient taking differently: Inject 2 Units into the skin 3 (three) times daily as needed for high blood sugar (sliding scale). Depending on sugar level, pt will inject 5-10 units. 05/02/18 07/26/18  Loletha Grayer, MD  potassium chloride (K-DUR) 10 MEQ tablet Take 10 mEq by mouth daily.    [provider]  vitamin C (VITAMIN C) 250 MG tablet Take 1 tablet (250 mg total) by mouth 2 (two) times daily. 06/20/18   Gladstone Lighter, MD  zolpidem (AMBIEN) 10 MG tablet Take 10 mg by mouth at bedtime as needed for sleep.    [provider]    Allergies Banana; Keflex [cephalexin]; Onion; Sulfa antibiotics; Grapeseed extract [nutritional supplements]; Shellfish allergy; and Grape seed    Social History Social History   Tobacco Use  . Smoking status: Never Smoker  . Smokeless tobacco: Never Used  Substance Use Topics  . Alcohol use: No    Frequency: Never  . Drug use: Yes    Types: Marijuana, PCP    Review of Systems Patient denies headaches, rhinorrhea,  blurry vision, numbness, shortness of breath, chest pain, edema, cough, abdominal pain, nausea, vomiting, diarrhea, dysuria, fevers, rashes or hallucinations unless otherwise stated above in HPI. ____________________________________________   PHYSICAL EXAM:  VITAL SIGNS: Vitals:   08/16/18 1030 08/16/18 1100  BP: (!) 164/111 (!) 162/110  Pulse: (!) 101 99  Resp: 16 15  Temp:    SpO2: 100% 100%    Constitutional: Alert and oriented.  Eyes: Conjunctivae are normal.  Head: Atraumatic. Nose: No congestion/rhinnorhea. Mouth/Throat: Mucous membranes are moist.   Neck: No stridor. Painless ROM.  Cardiovascular:  mildly tachycardic rate, regular rhythm. Grossly normal heart sounds.  Good peripheral circulation. Respiratory: Normal respiratory effort.  No retractions. Lungs CTAB. Gastrointestinal: Soft and nontender. No distention. No abdominal bruits. No CVA tenderness. Genitourinary:  Musculoskeletal: No lower extremity tenderness nor edema.  No joint effusions. Neurologic:  Normal speech and language. No gross focal neurologic deficits are appreciated. No facial droop Skin:  Skin is warm, dry and intact. No rash noted. Psychiatric: Mood and affect are normal. Speech and behavior are normal.  ____________________________________________   LABS (all labs ordered are listed, but only abnormal results are displayed)  Results for orders placed or performed during the hospital encounter of 08/16/18 (from the past 24 hour(s))  Glucose, capillary     Status: Abnormal   Collection Time: 08/16/18  8:49 AM  Result Value Ref Range   Glucose-Capillary >600 (HH) 70 - 99 mg/dL   Comment 1 Notify RN   CBC with Differential/Platelet     Status: Abnormal   Collection Time: 08/16/18  9:01 AM  Result Value Ref Range   WBC 9.3 4.0 - 10.5 K/uL   RBC 3.62 (L) 4.22 - 5.81 MIL/uL   Hemoglobin 9.9 (L) 13.0 - 17.0 g/dL   HCT 28.9 (L) 39.0 - 52.0 %   MCV 79.8 (L) 80.0 - 100.0 fL   MCH 27.3 26.0 - 34.0 pg   MCHC 34.3 30.0 - 36.0 g/dL   RDW 13.3 11.5 - 15.5 %   Platelets 368 150 - 400 K/uL   nRBC 0.0 0.0 - 0.2 %   Neutrophils Relative % 73 %   Neutro Abs 6.7 1.7 - 7.7 K/uL   Lymphocytes Relative 16 %   Lymphs Abs 1.5 0.7 - 4.0 K/uL   Monocytes Relative 10 %   Monocytes Absolute 0.9 0.1 - 1.0 K/uL   Eosinophils Relative 1 %   Eosinophils Absolute 0.1 0.0 - 0.5 K/uL   Basophils Relative 0 %   Basophils Absolute 0.0 0.0 - 0.1 K/uL   Immature Granulocytes 0 %   Abs Immature Granulocytes 0.03 0.00 - 0.07 K/uL  Comprehensive metabolic panel     Status: Abnormal   Collection Time: 08/16/18  9:01 AM  Result Value  Ref Range   Sodium 123 (L) 135 - 145 mmol/L   Potassium 3.6 3.5 - 5.1 mmol/L   Chloride 93 (L) 98 - 111 mmol/L   CO2 21 (L) 22 - 32 mmol/L   Glucose, Bld 738 (HH) 70 - 99 mg/dL   BUN 33 (H) 6 - 20 mg/dL   Creatinine, Ser 2.43 (H) 0.61 - 1.24 mg/dL   Calcium 8.1 (L) 8.9 - 10.3 mg/dL   Total Protein 6.3 (L) 6.5 - 8.1 g/dL   Albumin 2.6 (L) 3.5 - 5.0 g/dL   AST 17 15 - 41 U/L   ALT 15 0 - 44 U/L   Alkaline Phosphatase 129 (H) 38 - 126 U/L   Total Bilirubin 0.8  0.3 - 1.2 mg/dL   GFR calc non Af Amer 34 (L) >60 mL/min   GFR calc Af Amer 40 (L) >60 mL/min   Anion gap 9 5 - 15  Osmolality     Status: Abnormal   Collection Time: 08/16/18  9:01 AM  Result Value Ref Range   Osmolality 315 (H) 275 - 295 mOsm/kg   ____________________________________________  ED ECG REPORT I, Merlyn Lot, the attending physician, personally viewed and interpreted this ECG.   Date: 08/16/2018  EKG Time: 8:56  Rate: 105  Rhythm: sinus  Axis: normal  Intervals:  normal  ST&T Change: no stemi  ____________________________________________  RADIOLOGY   ____________________________________________   PROCEDURES  Procedure(s) performed:  .Critical Care Performed by: Merlyn Lot, MD Authorized by: Merlyn Lot, MD   Critical care provider statement:    Critical care time (minutes):  30   Critical care time was exclusive of:  Separately billable procedures and treating other patients   Critical care was necessary to treat or prevent imminent or life-threatening deterioration of the following conditions:  Endocrine crisis   Critical care was time spent personally by me on the following activities:  Development of treatment plan with patient or surrogate, discussions with consultants, evaluation of patient's response to treatment, examination of patient, obtaining history from patient or surrogate, ordering and performing treatments and interventions, ordering and review of laboratory  studies, ordering and review of radiographic studies, pulse oximetry, re-evaluation of patient's condition and review of old charts      Critical Care performed: yes ____________________________________________   INITIAL IMPRESSION / [place] / ED COURSE  Pertinent labs & imaging results that were available during my care of the patient were reviewed by me and considered in my medical decision making (see chart for details).   DDX: DKA, HHS, dehydration, gastroparesis, medication noncompliance  Shawn [name] is a 33 y.o. who presents to the ED with symptoms as described above.  Patient nontoxic-appearing does appear clinically dehydrated therefore we will give IV fluids.  Patient with significantly elevated blood sugar.  Blood work will be sent for the above differential.  Clinical Course as of Aug 16 1141  Wed Aug 16, 2018  1138 Patient does have elevated osmolality to 315.  Based on his significant AKI, hyperglycemia and hyperosmolality do feel patient will benefit from admission to the hospital for IV fluids insulin and further medical management.   [PR]    Clinical Course User Index [PR] Merlyn Lot, MD     As part of my medical decision making, I reviewed the following data within the [place] notes reviewed and incorporated, Labs reviewed, notes from prior ED visits and [place] Controlled Substance Database   ____________________________________________   FINAL CLINICAL IMPRESSION(S) / ED DIAGNOSES  Final diagnoses:  Hyperglycemia  Hyperosmolality      NEW MEDICATIONS STARTED DURING THIS VISIT:  New Prescriptions   No medications on file     Note:  This document was prepared using Dragon voice recognition software and may include unintentional dictation errors.    Merlyn Lot, MD 08/16/18 1141    Merlyn Lot, MD 08/16/18 1143

## 2018-08-16 NOTE — Consult Note (Signed)
Reason for Consult: Assistance with management of critical care issues. Referring Physician: Boykin Reaper MD ambulatory  Shawn [name] is an 33 y.o. male.  HPI: This is a very complex 33 year old gentleman with a history of diabetes mellitus since age 11 who presented to [facility] with abdominal pain and intractable nausea, vomiting and hyperglycemia noted when he did a glucose check.  He apparently arrived to the emergency room with a blood glucose in the 800 range.  He did not have significant ketoacidosis.  Apparently 2 days ago at the pain management clinic his blood sugars were in the 600 range.  He does not endorse any fevers, chills or sweats.  He only endorses abdominal pain nausea and vomiting.  He does have a known history of gastroparesis and significant diabetic neuropathy.  He is status post bilateral toe amputations and wears protective boot/brace in the lower extremities.  The patient has had multiple admissions for DKA in the past.  His only other complaint is issues with diarrhea which are chronic.  He is not in any respiratory distress  Past Medical History:  Diagnosis Date  . CKD (chronic kidney disease)   . Diabetes mellitus without complication ([place])   . GERD (gastroesophageal reflux disease)   . HTN (hypertension)   . IBS (irritable bowel syndrome)   . Osteomyelitis [facility])     Past Surgical History:  Procedure Laterality Date  . ABDOMINAL AORTOGRAM W/LOWER EXTREMITY Right 12/23/2017   Procedure: ABDOMINAL AORTOGRAM W/LOWER EXTREMITY;  Surgeon: Katha Cabal, MD;  Location: [place] CV LAB;  Service: Cardiovascular;  Laterality: Right;  . ACHILLES TENDON SURGERY Bilateral 06/16/2018   Procedure: ACHILLES LENGTHENING/KIDNER;  Surgeon: Albertine Patricia, DPM;  Location: [facility] ORS;  Service: Podiatry;  Laterality: Bilateral;  . AMPUTATION Right 12/24/2017   Procedure: AMPUTATION RAY;  Surgeon: Sharlotte Alamo, DPM;  Location: [facility] ORS;  Service: Podiatry;  Laterality: Right;  .  AMPUTATION Left 04/06/2018   Procedure: AMPUTATION RAY;  Surgeon: Sharlotte Alamo, DPM;  Location: [facility] ORS;  Service: Podiatry;  Laterality: Left;  . AMPUTATION Left 04/09/2018   Procedure: AMPUTATION RAY;  Surgeon: Sharlotte Alamo, DPM;  Location: [facility] ORS;  Service: Podiatry;  Laterality: Left;  . APPLICATION OF WOUND VAC Right 12/24/2017   Procedure: APPLICATION OF WOUND VAC;  Surgeon: Sharlotte Alamo, DPM;  Location: [facility] ORS;  Service: Podiatry;  Laterality: Right;  . APPLICATION OF WOUND VAC Right 01/01/2018   Procedure: APPLICATION OF WOUND VAC;  Surgeon: Albertine Patricia, DPM;  Location: [facility] OR[facility];  Service: Podiatry;  Laterality: Right;  . BONE EXCISION Bilateral 06/16/2018   Procedure: BONE EXCISION AND SOFT TISSUE;  Surgeon: Albertine Patricia, DPM;  Location: [facility] ORS;  Service: Podiatry;  Laterality: Bilateral;  . IRRIGATION AND DEBRIDEMENT FOOT Right 12/20/2017   Procedure: IRRIGATION AND DEBRIDEMENT FOOT;  Surgeon: Sharlotte Alamo, DPM;  Location: [facility] ORS;  Service: Podiatry;  Laterality: Right;  . IRRIGATION AND DEBRIDEMENT FOOT Right 12/24/2017   Procedure: IRRIGATION AND DEBRIDEMENT FOOT;  Surgeon: Sharlotte Alamo, DPM;  Location: [facility] OR[facility];  Service: Podiatry;  Laterality: Right;  . IRRIGATION AND DEBRIDEMENT FOOT Right 01/01/2018   Procedure: IRRIGATION AND DEBRIDEMENT FOOT-SKIN,SOFT TISSUE AND BONE;  Surgeon: Albertine Patricia, DPM;  Location: [facility] OR[facility];  Service: Podiatry;  Laterality: Right;  . IRRIGATION AND DEBRIDEMENT FOOT Left 04/06/2018   Procedure: IRRIGATION AND DEBRIDEMENT FOOT;  Surgeon: Sharlotte Alamo, DPM;  Location: [facility] OR[facility];  Service: Podiatry;  Laterality: Left;  . LOWER EXTREMITY ANGIOGRAPHY Left 04/06/2018   Procedure: Lower Extremity  Angiography;  Surgeon: Algernon Huxley, MD;  Location: [place] [facility];  Service: Cardiovascular;  Laterality: Left;    Family History  Problem Relation Age of Onset  . Diabetes Mother   . Ovarian cancer Mother   . Healthy Father     Social History:   reports that he has never smoked. He has never used smokeless tobacco. He reports current drug use. Drugs: Marijuana and PCP. He reports that he does not drink alcohol.  Allergies:  Allergies  Allergen Reactions  . Banana Hives, Nausea And Vomiting and Rash  . Keflex [Cephalexin] Rash    No swelling- also taken penicillin without any issue.  . Onion Hives, Nausea And Vomiting and Rash  . Sulfa Antibiotics Anaphylaxis  . Grapeseed Extract [Nutritional Supplements] Itching  . Shellfish Allergy Hives    "ALL SEAFOOD"  . Grape Seed Rash    Medications: I have reviewed the patient's current medications.  Results for orders placed or performed during the hospital encounter of 08/16/18 (from the past 48 hour(s))  Glucose, capillary     Status: Abnormal   Collection Time: 08/16/18  8:49 AM  Result Value Ref Range   Glucose-Capillary >600 (HH) 70 - 99 mg/dL   Comment 1 Notify RN   CBC with Differential/Platelet     Status: Abnormal   Collection Time: 08/16/18  9:01 AM  Result Value Ref Range   WBC 9.3 4.0 - 10.5 K/uL   RBC 3.62 (L) 4.22 - 5.81 MIL/uL   Hemoglobin 9.9 (L) 13.0 - 17.0 g/dL   HCT 28.9 (L) 39.0 - 52.0 %   MCV 79.8 (L) 80.0 - 100.0 fL   MCH 27.3 26.0 - 34.0 pg   MCHC 34.3 30.0 - 36.0 g/dL   RDW 13.3 11.5 - 15.5 %   Platelets 368 150 - 400 K/uL   nRBC 0.0 0.0 - 0.2 %   Neutrophils Relative % 73 %   Neutro Abs 6.7 1.7 - 7.7 K/uL   Lymphocytes Relative 16 %   Lymphs Abs 1.5 0.7 - 4.0 K/uL   Monocytes Relative 10 %   Monocytes Absolute 0.9 0.1 - 1.0 K/uL   Eosinophils Relative 1 %   Eosinophils Absolute 0.1 0.0 - 0.5 K/uL   Basophils Relative 0 %   Basophils Absolute 0.0 0.0 - 0.1 K/uL   Immature Granulocytes 0 %   Abs Immature Granulocytes 0.03 0.00 - 0.07 K/uL    Comment: Performed at [facility], [address] [place], [place] [address]  Comprehensive metabolic panel     Status: Abnormal   Collection Time: 08/16/18  9:01 AM  Result Value Ref Range    Sodium 123 (L) 135 - 145 mmol/L   Potassium 3.6 3.5 - 5.1 mmol/L   Chloride 93 (L) 98 - 111 mmol/L   CO2 21 (L) 22 - 32 mmol/L   Glucose, Bld 738 (HH) 70 - 99 mg/dL    Comment: CRITICAL RESULT CALLED TO, READ BACK BY AND VERIFIED WITH DEE MCCLAIN AT 0936 ON 08/16/2018 [place].    BUN 33 (H) 6 - 20 mg/dL   Creatinine, Ser 2.43 (H) 0.61 - 1.24 mg/dL   Calcium 8.1 (L) 8.9 - 10.3 mg/dL   Total Protein 6.3 (L) 6.5 - 8.1 g/dL   Albumin 2.6 (L) 3.5 - 5.0 g/dL   AST 17 15 - 41 U/L   ALT 15 0 - 44 U/L   Alkaline Phosphatase 129 (H) 38 - 126 U/L   Total Bilirubin  0.8 0.3 - 1.2 mg/dL   GFR calc non Af Amer 34 (L) >60 mL/min   GFR calc Af Amer 40 (L) >60 mL/min   Anion gap 9 5 - 15    Comment: Performed at [facility], [address] [place], [place] [address]  Osmolality     Status: Abnormal   Collection Time: 08/16/18  9:01 AM  Result Value Ref Range   Osmolality 315 (H) 275 - 295 mOsm/kg    Comment: Performed at [facility] [address] [place], [place] [address]  Urinalysis, Complete w Microscopic     Status: Abnormal   Collection Time: 08/16/18  9:02 AM  Result Value Ref Range   Color, Urine STRAW (A) YELLOW   APPearance CLEAR (A) CLEAR   Specific Gravity, Urine 1.015 1.005 - 1.030   pH 5.0 5.0 - 8.0   Glucose, UA >=500 (A) NEGATIVE mg/dL   Hgb urine dipstick SMALL (A) NEGATIVE   Bilirubin Urine NEGATIVE NEGATIVE   Ketones, ur NEGATIVE NEGATIVE mg/dL   Protein, ur 100 (A) NEGATIVE mg/dL   Nitrite NEGATIVE NEGATIVE   Leukocytes,Ua NEGATIVE NEGATIVE   RBC / HPF 0-5 0 - 5 RBC/hpf   WBC, UA 0-5 0 - 5 WBC/hpf   Bacteria, UA NONE SEEN NONE SEEN   Squamous Epithelial / LPF NONE SEEN 0 - 5   Mucus PRESENT     Comment: Performed at [facility], [address] [place], [place] [address]  Glucose, capillary     Status: Abnormal   Collection Time: 08/16/18 12:10 PM  Result Value Ref Range   Glucose-Capillary 433 (H) 70 - 99 mg/dL  Glucose, capillary      Status: Abnormal   Collection Time: 08/16/18 12:55 PM  Result Value Ref Range   Glucose-Capillary 400 (H) 70 - 99 mg/dL  MRSA PCR Screening     Status: Abnormal   Collection Time: 08/16/18 12:57 PM  Result Value Ref Range   MRSA by PCR POSITIVE (A) NEGATIVE    Comment:        The GeneXpert MRSA Assay (FDA approved for NASAL specimens only), is one component of a comprehensive MRSA colonization surveillance program. It is not intended to diagnose MRSA infection nor to guide or monitor treatment for MRSA infections. RESULT CALLED TO, READ BACK BY AND VERIFIED WITH: CHERYL GREEN 08/16/18 @ 1507  [place] Performed at [facility], [address] [place], [place] [address]   Glucose, capillary     Status: Abnormal   Collection Time: 08/16/18  1:59 PM  Result Value Ref Range   Glucose-Capillary 310 (H) 70 - 99 mg/dL  Basic metabolic panel     Status: Abnormal   Collection Time: 08/16/18  2:28 PM  Result Value Ref Range   Sodium 131 (L) 135 - 145 mmol/L   Potassium 3.5 3.5 - 5.1 mmol/L   Chloride 105 98 - 111 mmol/L   CO2 19 (L) 22 - 32 mmol/L   Glucose, Bld 295 (H) 70 - 99 mg/dL   BUN 30 (H) 6 - 20 mg/dL   Creatinine, Ser 2.13 (H) 0.61 - 1.24 mg/dL   Calcium 8.1 (L) 8.9 - 10.3 mg/dL   GFR calc non Af Amer 40 (L) >60 mL/min   GFR calc Af Amer 47 (L) >60 mL/min   Anion gap 7 5 - 15    Comment: Performed at [facility], [address] [place], [place] [address]  Lipase, blood  Status: Abnormal   Collection Time: 08/16/18  2:28 PM  Result Value Ref Range   Lipase 98 (H) 11 - 51 U/L    Comment: Performed at [facility], [address] [place], [place] [address]  Glucose, capillary     Status: Abnormal   Collection Time: 08/16/18  3:06 PM  Result Value Ref Range   Glucose-Capillary 170 (H) 70 - 99 mg/dL  Glucose, capillary     Status: Abnormal   Collection Time: 08/16/18  4:10 PM  Result Value Ref Range   Glucose-Capillary 117 (H) 70 - 99  mg/dL  Glucose, capillary     Status: Abnormal   Collection Time: 08/16/18  5:15 PM  Result Value Ref Range   Glucose-Capillary 231 (H) 70 - 99 mg/dL  Glucose, capillary     Status: Abnormal   Collection Time: 08/16/18  6:20 PM  Result Value Ref Range   Glucose-Capillary 184 (H) 70 - 99 mg/dL    No results found.  Review of Systems  Constitutional: Positive for malaise/fatigue. Negative for chills, diaphoresis and fever.  HENT: Negative.   Eyes: Negative.   Respiratory: Negative.   Cardiovascular: Negative.   Gastrointestinal: Positive for abdominal pain, diarrhea, heartburn, nausea and vomiting.  Genitourinary: Negative.   Musculoskeletal: Negative.   Skin: Negative.   Neurological:       Has diabetic neuropathy.  Endo/Heme/Allergies: Negative.   Psychiatric/Behavioral: Negative.   All other systems reviewed and are negative.  Blood pressure (!) 153/92, pulse 100, temperature 98.4 F (36.9 C), temperature source Oral, resp. rate 18, height 5\' 6"  (1.676 m), weight 48.6 kg, SpO2 100 %. Physical Exam  Nursing note and vitals reviewed. Constitutional: He appears cachectic.  Non-toxic appearance. He appears ill.  HENT:  Head: Normocephalic and atraumatic.  Right Ear: External ear normal.  Left Ear: External ear normal.  Nose: Nose normal.  Mouth/Throat: Mucous membranes are dry. No oropharyngeal exudate.  Eyes: Pupils are equal, round, and reactive to light. No scleral icterus.  Neck: Normal range of motion. Neck supple. No JVD present. No tracheal deviation present. No thyromegaly present.  Cardiovascular: Normal rate, regular rhythm, normal heart sounds and intact distal pulses.  Respiratory: Effort normal and breath sounds normal.  GI: Soft. Bowel sounds are normal. There is abdominal tenderness.  Musculoskeletal: Normal range of motion.        General: No edema.     Comments: Bilateral toe amputations  Lymphadenopathy:    He has no cervical adenopathy.  Neurological:  He is alert.  Skin: Skin is warm and dry.  Psychiatric: His speech is normal and behavior is normal. His affect is blunt.    Assessment/Plan: 1.  Hyperosmolar nonketotic state: Insulin infusion, transition to subcu insulin as soon as feasible.  Volume replete.  2.  Acute on chronic kidney injury: Likely precipitated by volume depletion due to hyperosmolar state.  Volume resuscitate.  Avoid nephrotoxins.  Monitor renal function.  3.  Abdominal pain with no rebound or guarding: May be related to metabolic derangements.  Lipase only mildly elevated but is persistently so even on prior blood work.  No evidence of acute pancreatitis.  4.  Gastroparesis: Control nausea and vomiting with antiemetics and promotility agents.  5.  DVT prophylaxis with subcu heparin.  Patient is currently stepdown status.  Transition to MedSurg status as soon as able after wean insulin infusion.   Renold Don, MD [facility] 08/16/2018, 9:03 PM

## 2018-08-17 LAB — BASIC METABOLIC PANEL
ANION GAP: 4 — AB (ref 5–15)
BUN: 29 mg/dL — ABNORMAL HIGH (ref 6–20)
CO2: 21 mmol/L — ABNORMAL LOW (ref 22–32)
Calcium: 7.9 mg/dL — ABNORMAL LOW (ref 8.9–10.3)
Chloride: 111 mmol/L (ref 98–111)
Creatinine, Ser: 1.87 mg/dL — ABNORMAL HIGH (ref 0.61–1.24)
GFR calc non Af Amer: 47 mL/min — ABNORMAL LOW (ref >60)
GFR, EST AFRICAN AMERICAN: 55 mL/min — AB (ref >60)
Glucose, Bld: 200 mg/dL — ABNORMAL HIGH (ref 70–99)
Potassium: 3.4 mmol/L — ABNORMAL LOW (ref 3.5–5.1)
Sodium: 136 mmol/L (ref 135–145)

## 2018-08-17 LAB — HEMOGLOBIN A1C
Hgb A1c MFr Bld: 14.5 % — ABNORMAL HIGH (ref 4.8–5.6)
Mean Plasma Glucose: 369 mg/dL

## 2018-08-17 LAB — CBC
HCT: 24.2 % — ABNORMAL LOW (ref 39.0–52.0)
Hemoglobin: 8.1 g/dL — ABNORMAL LOW (ref 13.0–17.0)
MCH: 27.4 pg (ref 26.0–34.0)
MCHC: 33.5 g/dL (ref 30.0–36.0)
MCV: 81.8 fL (ref 80.0–100.0)
Platelets: 274 10*3/uL (ref 150–400)
RBC: 2.96 MIL/uL — ABNORMAL LOW (ref 4.22–5.81)
RDW: 13.7 % (ref 11.5–15.5)
WBC: 8 10*3/uL (ref 4.0–10.5)
nRBC: 0 % (ref 0.0–0.2)

## 2018-08-17 LAB — GLUCOSE, CAPILLARY
Glucose-Capillary: 262 mg/dL — ABNORMAL HIGH (ref 70–99)
Glucose-Capillary: 158 mg/dL — ABNORMAL HIGH (ref 70–99)
Glucose-Capillary: 197 mg/dL — ABNORMAL HIGH (ref 70–99)
Glucose-Capillary: 244 mg/dL — ABNORMAL HIGH (ref 70–99)

## 2018-08-17 MED ORDER — POTASSIUM CHLORIDE CRYS ER 20 MEQ PO TBCR
20.0000 meq | EXTENDED_RELEASE_TABLET | Freq: Once | ORAL | Status: AC
  Administered 2018-08-17: 20 meq via ORAL
  Filled 2018-08-17: qty 1

## 2018-08-17 MED ORDER — MUPIROCIN 2 % EX OINT
1.0000 "application " | TOPICAL_OINTMENT | Freq: Two times a day (BID) | CUTANEOUS | Status: DC
  Administered 2018-08-17 – 2018-08-18 (×4): 1 via NASAL
  Filled 2018-08-17: qty 22

## 2018-08-17 MED ORDER — INSULIN GLARGINE 100 UNIT/ML ~~LOC~~ SOLN
27.0000 [IU] | SUBCUTANEOUS | Status: DC
  Administered 2018-08-17: 27 [IU] via SUBCUTANEOUS
  Filled 2018-08-17: qty 0.27

## 2018-08-17 MED ORDER — GABAPENTIN 100 MG PO CAPS
200.0000 mg | ORAL_CAPSULE | Freq: Two times a day (BID) | ORAL | Status: DC
  Administered 2018-08-17 – 2018-08-18 (×3): 200 mg via ORAL
  Filled 2018-08-17 (×3): qty 2

## 2018-08-17 MED ORDER — LOPERAMIDE HCL 2 MG PO CAPS
2.0000 mg | ORAL_CAPSULE | ORAL | Status: DC | PRN
  Administered 2018-08-18: 04:00:00 2 mg via ORAL
  Filled 2018-08-17: qty 1

## 2018-08-17 MED ORDER — CHLORHEXIDINE GLUCONATE CLOTH 2 % EX PADS
6.0000 | MEDICATED_PAD | Freq: Every day | CUTANEOUS | Status: DC
  Administered 2018-08-17 – 2018-08-18 (×2): 6 via TOPICAL

## 2018-08-17 NOTE — Progress Notes (Signed)
Report given to RN Danielle for patient to be transferred to room 122 with ortho boots, and back pack of personal belongings.

## 2018-08-17 NOTE — Consult Note (Signed)
PHARMACY CONSULT NOTE - FOLLOW UP  Pharmacy Consult for Electrolyte Monitoring and Replacement   Recent Labs: Potassium (mmol/L)  Date Value  08/17/2018 3.4 (L)  10/06/2014 3.4 (L)   Magnesium (mg/dL)  Date Value  07/17/2018 1.9   Calcium (mg/dL)  Date Value  08/17/2018 7.9 (L)   Calcium, Total (mg/dL)  Date Value  10/06/2014 10.0   Albumin (g/dL)  Date Value  08/16/2018 2.6 (L)  08/14/2018 3.6 (L)  10/06/2014 5.3 (H)   Phosphorus (mg/dL)  Date Value  11/11/2017 2.5   Sodium (mmol/L)  Date Value  08/17/2018 136  08/14/2018 128 (L)  10/06/2014 132 (L)    Assessment: Pharmacy consulted for electrolyte monitoring and replacement for 33 yo male admitted with Hyperosmolar hyperglycemic state.   Plan:  2/13 AM: K: 3.4. Patient has KCL 69mEq Daily ordered. Will order an additional KCL 59mEq x 1 dose.   Pharmacy will continue to monitor labs and replace potassium as needed.   Pernell Dupre, PharmD, BCPS Clinical Pharmacist 08/17/2018 6:36 AM

## 2018-08-17 NOTE — Progress Notes (Addendum)
Inpatient Diabetes Program Recommendations  AACE/ADA: New Consensus Statement on Inpatient Glycemic Control  Target Ranges:  Prepandial:   less than 140 mg/dL      Peak postprandial:   less than 180 mg/dL (1-2 hours)      Critically ill patients:  140 - 180 mg/dL   Results for GRAYSYN, BACHE (MRN [id number]) as of 08/17/2018 07:53  Ref. Range 08/16/2018 08:49 08/16/2018 12:10 08/16/2018 12:55 08/16/2018 13:59 08/16/2018 15:06 08/16/2018 16:10 08/16/2018 17:15 08/16/2018 18:20 08/16/2018 21:16 08/17/2018 07:38  Glucose-Capillary Latest Ref Range: 70 - 99 mg/dL >600 (HH) 433 (H) 400 (H) 310 (H) 170 (H) 117 (H) 231 (H) 184 (H) 238 (H) 262 (H)   Results for BURYL, BAMBER (MRN [id number]) as of 08/17/2018 07:53  Ref. Range 12/19/2017 03:41 04/05/2018 17:39 06/14/2018 14:08 08/16/2018 14:28  Hemoglobin A1C Latest Ref Range: 4.8 - 5.6 % 16.9 (H) 12.9 (H) 10.8 (H) 14.5 (H)   Review of Glycemic Control  Diabetes history:DM1 (makes NO insulin; requires basal, correction, and meal coverage insulin) Outpatient Diabetes medications:Lantus 35units BID, Regular5 units with meals Current orders for Inpatient glycemic control:Lantus 24 units Q24H, Novolog 3 units TID with meals, Novolog 0-9 units TID with meals, Novolog 0-5 units QHS  Inpatient Diabetes Program Recommendations:  Insulin-Basal: Please consider increasing Lantus to 27 units Q24H. A1C: A1C 14.5% on 08/16/18 and prior A1C was 10.8% on 06/14/18. Patient states that his insurance made him switch from Novolin R to Humulin R 2 months ago and he feels the Humulin R is not working for him. Question if patient could use Humalog (vials) instead of Humulin R to see if DM is better controlled. Please consider providing Rx for: Humalog (vials) at time of discharge (to be used in place of Humulin R).  NOTE: Patient has history of Type 1 DM and is well known to Inpatient Diabetes team. This is patient's 6th admission in the past 4 months. Noted patient noted to  have [place] this admission (instead of DKA) with initial glucose of 738 mg/dl. Patient received Lantus 24 units at 16:13 on 08/16/18 at time of transition off IV insulin. Glucose more elevated with meal intake after transition off IV insulin; likely due to not receiving any meal coverage for meal intake.    Addendum 08/17/18-Spoke with patient about diabetes and home regimen for diabetes control. Patient reports that he is followed by [name] for diabetes management and currently he takes Lantus 35 units BID and Humulin R 5 units TID with meals as an outpatient for diabetes control.  Patient states that he is not using a correction scale with the Humulin R insulin. Explained that with Type 1 DM, he likely needs to be taking basal, correction, and meal coverage insulin since his body is not able to make any insulin.  Patient reports that he is using Lantus and Humulin R vials/syringe and he is taking insulin as prescribed.  Questioned patient again about Lantus dose and frequency and patient confirms that he is taking Lantus 35 units twice a day. Explained that he is currently order Lantus 27 units Q24H (got Lantus 24 units yesterday at transition off IV insulin).  Explained that Lantus 35 units BID is a lot of insulin for someone with Type 1 and on 50 kg.   Patient reports that he usually uses his abdomen for insulin injections (rotating sites). Examined patient's abdomen and no hardened areas (scar tissue) noted.  Patient states that his insurance made him switch from Novolin R to  Humulin R about 2 months ago and since then he has not been able to get his glucose under 200 mg/dl.   Discussed A1C results (14.5% on 08/16/18) and explained that his current A1C indicates an average glucose of 369 mg/dl over the past 2-3 months. Prior A1C was down to 10.8% on 06/14/2018. Patient states that he feels that his A1C went back up because the Humulin R is not working for him. Question if Humalog (appears Humalog is  preferred over Novolog with [place] Medicaid) could be used instead of Humulin R to see if glucose improves with changing rapid acting insulin.  Patient is agreeable to try Humalog instead of the Humulin R if changed at discharge.  Discussed glucose and A1C goals. Stressed to the patient the importance of improving glycemic control to prevent further complications from uncontrolled diabetes. Patient states that he has an appointment with [name] coming up in March and they plan to talk more about seeing if he can get an insulin pump with a continuous glucose monitoring sensor. Encouraged patient to follow through and keep appointments with [name] so he can assist him with getting DM better controlled.  Patient verbalized understanding of information discussed and he states that he has no further questions at this time related to diabetes.  Thanks, [name], RN, MSN, CDE Diabetes Coordinator Inpatient Diabetes Program [phone] (Team Pager from 8am to 5pm)

## 2018-08-17 NOTE — Progress Notes (Signed)
[place] [facility] [place] NAME: Shawn [name]    MR#:  [id number]  DATE OF BIRTH:  20-Feb-1986  SUBJECTIVE:  CHIEF COMPLAINT:   Chief Complaint  Patient presents with  . Abdominal Pain  . Hyperglycemia  . Emesis    REVIEW OF SYSTEMS:  Review of Systems  Constitutional: Negative for chills and fever.  HENT: Negative for congestion, ear discharge, hearing loss and nosebleeds.   Respiratory: Negative for cough, shortness of breath and wheezing.   Cardiovascular: Negative for chest pain and palpitations.  Gastrointestinal: Positive for abdominal pain and nausea. Negative for constipation, diarrhea and vomiting.  Genitourinary: Negative for dysuria.  Musculoskeletal: Positive for myalgias.  Neurological: Negative for dizziness, focal weakness, seizures, weakness and headaches.  Psychiatric/Behavioral: Negative for depression.    DRUG ALLERGIES:   Allergies  Allergen Reactions  . Banana Hives, Nausea And Vomiting and Rash  . Keflex [Cephalexin] Rash    No swelling- also taken penicillin without any issue.  . Onion Hives, Nausea And Vomiting and Rash  . Sulfa Antibiotics Anaphylaxis  . Grapeseed Extract [Nutritional Supplements] Itching  . Shellfish Allergy Hives    "ALL SEAFOOD"  . Grape Seed Rash    VITALS:  Blood pressure (!) 145/106, pulse (!) 106, temperature 97.6 F (36.4 C), temperature source Axillary, resp. rate 14, height 5\' 6"  (1.676 m), weight 50.9 kg, SpO2 100 %.  PHYSICAL EXAMINATION:  Physical Exam   GENERAL:  33 y.o.-year-old thin built ill nourished patient lying in the bed with no acute distress.  EYES: Pupils equal, round, reactive to light and accommodation. No scleral icterus. Extraocular muscles intact.  HEENT: Head atraumatic, normocephalic. Oropharynx and nasopharynx clear.  NECK:  Supple, no jugular venous distention. No thyroid enlargement, no tenderness.  LUNGS: Normal breath sounds bilaterally, no  wheezing, rales,rhonchi or crepitation. No use of accessory muscles of respiration.  CARDIOVASCULAR: S1, S2 normal. No murmurs, rubs, or gallops.  ABDOMEN: Soft, tender in epigastric region with voluntary guarding-no rigidity or rebound tenderness, nondistended. Bowel sounds present. No organomegaly or mass.  EXTREMITIES: No pedal edema, cyanosis, or clubbing.  Status post amputation of some toes NEUROLOGIC: Cranial nerves II through XII are intact. Muscle strength 5/5 in all extremities. Sensation intact. Gait not checked.  PSYCHIATRIC: The patient is alert and oriented x 3.  SKIN: No obvious rash, lesion, or ulcer.    LABORATORY PANEL:   CBC Recent Labs  Lab 08/17/18 0450  WBC 8.0  HGB 8.1*  HCT 24.2*  PLT 274   ------------------------------------------------------------------------------------------------------------------  Chemistries  Recent Labs  Lab 08/16/18 0901  08/17/18 0450  NA 123*   < > 136  K 3.6   < > 3.4*  CL 93*   < > 111  CO2 21*   < > 21*  GLUCOSE 738*   < > 200*  BUN 33*   < > 29*  CREATININE 2.43*   < > 1.87*  CALCIUM 8.1*   < > 7.9*  AST 17  --   --   ALT 15  --   --   ALKPHOS 129*  --   --   BILITOT 0.8  --   --    < > = values in this interval not displayed.   ------------------------------------------------------------------------------------------------------------------  Cardiac Enzymes No results for input(s): TROPONINI in the last 168 hours. ------------------------------------------------------------------------------------------------------------------  RADIOLOGY:  No results found.  EKG:   Orders placed or performed during the hospital encounter of 07/26/18  .  EKG 12-Lead  . EKG 12-Lead  . EKG    ASSESSMENT AND PLAN:   33 year old male with past medical history significant for type 1 diabetes, neuropathy, gastroparesis presents to the hospital with abdominal pain nausea and vomiting  1.  Hyperosmolar hyperglycemic state  without ketosis-with known type 1 diabetes mellitus -Several admissions in the past for the same. -Admitted for IV insulin drip, now transition to subcutaneous insulin.  Doses being adjusted -Follow-up A1c -On carb controlled diet -Already on NovoLog pre-meal insulin and sliding scale  2.  Acute renal failure on CKD stage III-baseline creatinine around 1.7, likely secondary to prerenal causes and dehydration -Continue IV fluids for now  3.  Gastroparesis and chronic abdominal pain-continue home Percocet. -Scheduled Reglan and Zofran as needed  4.  Peripheral neuropathy-added gabapentin  5.  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  Had Achilles tendon lengthenings done during that admission, off his cast and doing much better.  6.  DVT prophylaxis-subcutaneous heparin   All the records are reviewed and case discussed with Care Management/Social Workerr. Management plans discussed with the patient, family and they are in agreement.  CODE STATUS: Full code  TOTAL TIME TAKING CARE OF THIS PATIENT: 38 minutes.   POSSIBLE D/C IN 1-2 DAYS, DEPENDING ON CLINICAL CONDITION.   Gladstone Lighter M.D on 08/17/2018 at 8:45 AM  Between 7am to 6pm - Pager - [phone]  After 6pm go to [email or web address] - password [email or web address]  Office  [phone]  CC: Primary care physician; Valera Castle, MD

## 2018-08-17 NOTE — Progress Notes (Signed)
The patient did not show up to the appointment.

## 2018-08-17 NOTE — Care Management Note (Signed)
Case Management Note  Patient Details  Name: Shawn [name] MRN: [id number] Date of Birth: April 12, 1986  Subjective/Objective:    Patient admitted with hyperglycemia.  Patient has frequent admissions for complications related to his diabetes, 7 admissions in the last 6 months.  RNCM consult for medication assistance.  Patient is from home where he lives with his father and his son.  Patient is in a wheelchair most of the time at home, he also has a walker.  Father or [employer] provide transportation.  Patient was closed to [place] in January.  PCP is Olmedo last seen a little over a week ago, pharmacy is [facility].  Patient reports that he has no trouble getting or affording his medications.  He reports that he forgot to check his blood sugar once over the past few days but other than that he checks his sugar and takes his insulin.  No discharge needs identified at this time. Doran Clay RN BSN Care Manager [phone]                   Action/Plan:   Expected Discharge Date:                  Expected Discharge Plan:  Home/Self Care  In-House Referral:     Discharge planning Services  CM Consult  Post Acute Care Choice:    Choice offered to:     DME Arranged:    DME Agency:     HH Arranged:    [place] Agency:     Status of Service:  Completed, signed off  If discussed at [place] of Stay Meetings, dates discussed:    Additional Comments:  Shelbie Hutching, RN 08/17/2018, 11:25 AM

## 2018-08-18 LAB — 25-HYDROXY VITAMIN D LCMS D2+D3
25-Hydroxy, Vitamin D-2: <1 ng/mL
25-Hydroxy, Vitamin D-3: 3.4 ng/mL
25-Hydroxy, Vitamin D: 3.6 ng/mL — ABNORMAL LOW

## 2018-08-18 LAB — COMP. METABOLIC PANEL (12)
AST: 17 IU/L (ref 0–40)
Albumin/Globulin Ratio: 1.2 (ref 1.2–2.2)
Albumin: 3.6 g/dL — ABNORMAL LOW (ref 4.1–5.2)
Alkaline Phosphatase: 186 IU/L — ABNORMAL HIGH (ref 39–117)
BUN/Creatinine Ratio: 13 (ref 9–20)
BUN: 25 mg/dL — ABNORMAL HIGH (ref 6–20)
Bilirubin Total: <0.2 mg/dL (ref 0.0–1.2)
Calcium: 9.1 mg/dL (ref 8.7–10.2)
Chloride: 90 mmol/L — ABNORMAL LOW (ref 96–106)
Creatinine, Ser: 1.92 mg/dL — ABNORMAL HIGH (ref 0.76–1.27)
GFR calc Af Amer: 53 mL/min/{1.73_m2} — ABNORMAL LOW (ref >59)
GFR, EST NON AFRICAN AMERICAN: 46 mL/min/{1.73_m2} — AB (ref >59)
Globulin, Total: 3 g/dL (ref 1.5–4.5)
Glucose: 622 mg/dL (ref 65–99)
Potassium: 4.2 mmol/L (ref 3.5–5.2)
Sodium: 128 mmol/L — ABNORMAL LOW (ref 134–144)
Total Protein: 6.6 g/dL (ref 6.0–8.5)

## 2018-08-18 LAB — GLUCOSE, CAPILLARY
Glucose-Capillary: 226 mg/dL — ABNORMAL HIGH (ref 70–99)
Glucose-Capillary: 146 mg/dL — ABNORMAL HIGH (ref 70–99)
Glucose-Capillary: 267 mg/dL — ABNORMAL HIGH (ref 70–99)

## 2018-08-18 LAB — C-REACTIVE PROTEIN: CRP: 2 mg/L (ref 0–10)

## 2018-08-18 LAB — BASIC METABOLIC PANEL
Anion gap: 6 (ref 5–15)
BUN: 33 mg/dL — ABNORMAL HIGH (ref 6–20)
CALCIUM: 8.2 mg/dL — AB (ref 8.9–10.3)
CO2: 18 mmol/L — ABNORMAL LOW (ref 22–32)
Chloride: 113 mmol/L — ABNORMAL HIGH (ref 98–111)
Creatinine, Ser: 1.91 mg/dL — ABNORMAL HIGH (ref 0.61–1.24)
GFR calc Af Amer: 53 mL/min — ABNORMAL LOW (ref >60)
GFR calc non Af Amer: 46 mL/min — ABNORMAL LOW (ref >60)
Glucose, Bld: 205 mg/dL — ABNORMAL HIGH (ref 70–99)
Potassium: 3.8 mmol/L (ref 3.5–5.1)
Sodium: 137 mmol/L (ref 135–145)

## 2018-08-18 LAB — SEDIMENTATION RATE: Sed Rate: 64 mm/hr — ABNORMAL HIGH (ref 0–15)

## 2018-08-18 MED ORDER — ALPRAZOLAM 0.25 MG PO TABS: 0.2500 mg | ORAL_TABLET | Freq: Three times a day (TID) | ORAL | 0 refills | Status: DC | PRN

## 2018-08-18 MED ORDER — PROMETHAZINE HCL 25 MG PO TABS: 25.0000 mg | ORAL_TABLET | Freq: Four times a day (QID) | ORAL | 0 refills | Status: DC | PRN

## 2018-08-18 MED ORDER — GABAPENTIN 300 MG PO CAPS: 300.0000 mg | ORAL_CAPSULE | Freq: Two times a day (BID) | ORAL | 0 refills | Status: DC

## 2018-08-18 MED ORDER — INSULIN ASPART 100 UNIT/ML ~~LOC~~ SOLN
4.0000 [IU] | Freq: Three times a day (TID) | SUBCUTANEOUS | Status: DC
  Administered 2018-08-18 (×2): 4 [IU] via SUBCUTANEOUS
  Filled 2018-08-18 (×2): qty 1

## 2018-08-18 MED ORDER — INSULIN GLARGINE 100 UNIT/ML ~~LOC~~ SOLN
30.0000 [IU] | SUBCUTANEOUS | Status: DC
  Administered 2018-08-18: 17:00:00 30 [IU] via SUBCUTANEOUS
  Filled 2018-08-18: qty 0.3

## 2018-08-18 MED ORDER — INSULIN ASPART 100 UNIT/ML ~~LOC~~ SOLN: 4.0000 [IU] | Freq: Three times a day (TID) | SUBCUTANEOUS | 11 refills | Status: DC

## 2018-08-18 MED ORDER — OXYCODONE-ACETAMINOPHEN 10-325 MG PO TABS: 1.0000 | ORAL_TABLET | Freq: Three times a day (TID) | ORAL | 0 refills | Status: DC | PRN

## 2018-08-18 MED ORDER — LISINOPRIL 10 MG PO TABS: 10.0000 mg | ORAL_TABLET | Freq: Every day | ORAL | 2 refills | Status: DC

## 2018-08-18 MED ORDER — INSULIN GLARGINE 100 UNIT/ML ~~LOC~~ SOLN: 30.0000 [IU] | SUBCUTANEOUS | 11 refills | Status: DC

## 2018-08-18 NOTE — Consult Note (Signed)
PHARMACY CONSULT NOTE - FOLLOW UP  Pharmacy Consult for Electrolyte Monitoring and Replacement   Recent Labs: Potassium (mmol/L)  Date Value  08/18/2018 3.8  10/06/2014 3.4 (L)   Magnesium (mg/dL)  Date Value  07/17/2018 1.9   Calcium (mg/dL)  Date Value  08/18/2018 8.2 (L)   Calcium, Total (mg/dL)  Date Value  10/06/2014 10.0   Albumin (g/dL)  Date Value  08/16/2018 2.6 (L)  08/14/2018 3.6 (L)  10/06/2014 5.3 (H)   Phosphorus (mg/dL)  Date Value  11/11/2017 2.5   Sodium (mmol/L)  Date Value  08/18/2018 137  08/14/2018 128 (L)  10/06/2014 132 (L)    Assessment: Pharmacy consulted for electrolyte monitoring and replacement for 33 yo male admitted with Hyperosmolar hyperglycemic state.   Plan:  2/14 AM: K: 3.8. Patient has KCL 92mEq Daily ordered. No additional replacement warranted today   Pharmacy will continue to monitor labs and replace potassium as needed.   Lu Duffel, PharmD, BCPS Clinical Pharmacist 08/18/2018 7:36 AM

## 2018-08-19 LAB — COMPLIANCE DRUG ANALYSIS, UR

## 2018-08-21 ENCOUNTER — Ambulatory Visit (HOSPITAL_BASED_OUTPATIENT_CLINIC_OR_DEPARTMENT_OTHER): Payer: Medicaid Other | Admitting: Pain Medicine

## 2018-08-21 DIAGNOSIS — E860 Dehydration: Secondary | ICD-10-CM

## 2018-08-21 DIAGNOSIS — Z87448 Personal history of other diseases of urinary system: Secondary | ICD-10-CM

## 2018-08-21 DIAGNOSIS — R1084 Generalized abdominal pain: Secondary | ICD-10-CM

## 2018-08-21 DIAGNOSIS — E1042 Type 1 diabetes mellitus with diabetic polyneuropathy: Secondary | ICD-10-CM

## 2018-08-21 DIAGNOSIS — M79673 Pain in unspecified foot: Secondary | ICD-10-CM

## 2018-08-21 DIAGNOSIS — M79605 Pain in left leg: Secondary | ICD-10-CM

## 2018-08-21 DIAGNOSIS — L089 Local infection of the skin and subcutaneous tissue, unspecified: Secondary | ICD-10-CM

## 2018-08-21 DIAGNOSIS — M79604 Pain in right leg: Secondary | ICD-10-CM

## 2018-08-21 DIAGNOSIS — G8929 Other chronic pain: Secondary | ICD-10-CM

## 2018-08-21 DIAGNOSIS — N183 Chronic kidney disease, stage 3 (moderate): Secondary | ICD-10-CM

## 2018-08-21 DIAGNOSIS — E559 Vitamin D deficiency, unspecified: Secondary | ICD-10-CM | POA: Insufficient documentation

## 2018-08-21 DIAGNOSIS — N1831 Chronic kidney disease, stage 3a: Secondary | ICD-10-CM | POA: Insufficient documentation

## 2018-08-21 DIAGNOSIS — M869 Osteomyelitis, unspecified: Secondary | ICD-10-CM

## 2018-08-21 DIAGNOSIS — G894 Chronic pain syndrome: Secondary | ICD-10-CM

## 2018-08-21 MED ORDER — MAGNESIUM 500 MG PO CAPS: 500.0000 mg | ORAL_CAPSULE | Freq: Every day | ORAL | 5 refills | Status: DC

## 2018-08-21 MED ORDER — CALCIUM CARBONATE 600 MG PO TABS: 600.0000 mg | ORAL_TABLET | Freq: Two times a day (BID) | ORAL | 0 refills | Status: DC

## 2018-08-21 MED ORDER — ERGOCALCIFEROL 1.25 MG (50000 UT) PO CAPS: 50000.0000 [IU] | ORAL_CAPSULE | ORAL | 0 refills | Status: DC

## 2018-08-21 MED ORDER — VITAMIN D3 125 MCG (5000 UT) PO CAPS: 1.0000 | ORAL_CAPSULE | Freq: Every day | ORAL | 5 refills | Status: DC

## 2018-08-22 NOTE — Discharge Summary (Signed)
[place] [facility] [place] NAME: Shawn [name]    MR#:  [id number]  DATE OF BIRTH:  1986/03/08  DATE OF ADMISSION:  08/16/2018   ADMITTING PHYSICIAN: Hillary Bow, MD  DATE OF DISCHARGE: 08/18/2018  6:37 PM  PRIMARY CARE PHYSICIAN: Valera Castle, MD   ADMISSION DIAGNOSIS:   Hyperosmolality [E87.0] Hyperglycemia [R73.9]  DISCHARGE DIAGNOSIS:   Active Problems:   HHNC (hyperglycemic hyperosmolar nonketotic coma) ([place])   SECONDARY DIAGNOSIS:   Past Medical History:  Diagnosis Date  . CKD (chronic kidney disease)   . Diabetes mellitus without complication ([place])   . GERD (gastroesophageal reflux disease)   . HTN (hypertension)   . IBS (irritable bowel syndrome)   . Osteomyelitis [employer])     HOSPITAL COURSE:   33 year old male with past medical history significant for type 1 diabetes, neuropathy, gastroparesis presents to the hospital with abdominal pain nausea and vomiting  1.  Hyperosmolar hyperglycemic state without ketosis-with known type 1 diabetes mellitus -Several admissions in the past for the same. -Admitted for IV insulin drip, now transitioned to subcutaneous insulin.  Doses  adjusted - A1c of 14.5, concerned about noncompliance -On carb controlled diet -Also started on NovoLog pre-meal insulin  2.  Acute renal failure on CKD stage III-baseline creatinine around 1.7, likely secondary to prerenal causes and dehydration -Received IV fluids while in the hospital  3.  Gastroparesis and chronic abdominal pain-continue home Percocet. -Patient is supposed to follow-up with pain management this week  4.  Peripheral neuropathy-added gabapentin and follow-up with pain management  5.  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  Had Achilles tendon lengthenings done during that admission, off his cast and doing much better.  6.   Hypertension-started on lisinopril  Up and ambulatory. Discharge home  DISCHARGE CONDITIONS:   Guarded  CONSULTS OBTAINED:   None  DRUG ALLERGIES:   Allergies  Allergen Reactions  . Banana Hives, Nausea And Vomiting and Rash  . Keflex [Cephalexin] Rash    No swelling- also taken penicillin without any issue.  . Onion Hives, Nausea And Vomiting and Rash  . Sulfa Antibiotics Anaphylaxis  . Grapeseed Extract [Nutritional Supplements] Itching  . Shellfish Allergy Hives    "ALL SEAFOOD"  . Grape Seed Rash   DISCHARGE MEDICATIONS:   Allergies as of 08/18/2018      Reactions   Banana Hives, Nausea And Vomiting, Rash   Keflex [cephalexin] Rash   No swelling- also taken penicillin without any issue.   Onion Hives, Nausea And Vomiting, Rash   Sulfa Antibiotics Anaphylaxis   Grapeseed Extract [nutritional Supplements] Itching   Shellfish Allergy Hives   "ALL SEAFOOD"   Grape Seed Rash      Medication List    STOP taking these medications   hydrALAZINE 25 MG tablet Commonly known as:  APRESOLINE   insulin regular 100 units/mL injection Commonly known as:  NOVOLIN R,HUMULIN R     TAKE these medications   ALPRAZolam 0.25 MG tablet Commonly known as:  XANAX Take 1 tablet (0.25 mg total) by mouth 3 (three) times daily as needed for anxiety.   ascorbic acid 250 MG tablet Commonly known as:  VITAMIN C Take 1 tablet (250 mg total) by mouth 2 (two) times daily.   ferrous sulfate 325 (65 FE) MG tablet Take 1 tablet (325 mg total) by mouth 2 (two) times daily with a meal.  gabapentin 300 MG capsule Commonly known as:  NEURONTIN Take 1 capsule (300 mg total) by mouth 2 (two) times daily for 30 days.   insulin aspart 100 UNIT/ML injection Commonly known as:  novoLOG Inject 4 Units into the skin 3 (three) times daily with meals.   insulin glargine 100 UNIT/ML injection Commonly known as:  LANTUS Inject 0.3 mLs (30 Units total) into the skin daily. What changed:     how much to take  when to take this   Insulin Syringes (Disposable) U-100 0.3 ML Misc 1 Syringe by Does not apply route 4 (four) times daily -  with meals and at bedtime.   lisinopril 10 MG tablet Commonly known as:  PRINIVIL,ZESTRIL Take 1 tablet (10 mg total) by mouth daily. What changed:    medication strength  how much to take  when to take this   oxyCODONE-acetaminophen 10-325 MG tablet Commonly known as:  PERCOCET Take 1 tablet by mouth every 8 (eight) hours as needed for pain. What changed:  when to take this   potassium chloride 10 MEQ tablet Commonly known as:  K-DUR Take 10 mEq by mouth daily.   promethazine 25 MG tablet Commonly known as:  PHENERGAN Take 1 tablet (25 mg total) by mouth every 6 (six) hours as needed for nausea or vomiting.   zolpidem 10 MG tablet Commonly known as:  AMBIEN Take 10 mg by mouth at bedtime as needed for sleep.        DISCHARGE INSTRUCTIONS:   1. PCP f/u in 1-2 weeks 2. Pain management follow up as prior scheduled  DIET:   Diabetic diet  ACTIVITY:   Activity as tolerated  OXYGEN:   Home Oxygen: No.  Oxygen Delivery: room air  DISCHARGE LOCATION:   home   If you experience worsening of your admission symptoms, develop shortness of breath, life threatening emergency, suicidal or homicidal thoughts you must seek medical attention immediately by calling 911 or calling your MD immediately  if symptoms less severe.  You Must read complete instructions/literature along with all the possible adverse reactions/side effects for all the Medicines you take and that have been prescribed to you. Take any new Medicines after you have completely understood and accpet all the possible adverse reactions/side effects.   Please note  You were cared for by a hospitalist during your hospital stay. If you have any questions about your discharge medications or the care you received while you were in the hospital after you are  discharged, you can call the unit and asked to speak with the hospitalist on call if the hospitalist that took care of you is not available. Once you are discharged, your primary care physician will handle any further medical issues. Please note that NO REFILLS for any discharge medications will be authorized once you are discharged, as it is imperative that you return to your primary care physician (or establish a relationship with a primary care physician if you do not have one) for your aftercare needs so that they can reassess your need for medications and monitor your lab values.    On the day of Discharge:  VITAL SIGNS:   Blood pressure 127/69, pulse 88, temperature 98 F (36.7 C), temperature source Oral, resp. rate 18, height 5\' 6"  (1.676 m), weight 49.7 kg, SpO2 98 %.  PHYSICAL EXAMINATION:    GENERAL:  33 y.o.-year-old thin built ill nourished patient lying in the bed with no acute distress.  EYES: Pupils equal, round, reactive to  light and accommodation. No scleral icterus. Extraocular muscles intact.  HEENT: Head atraumatic, normocephalic. Oropharynx and nasopharynx clear.  NECK:  Supple, no jugular venous distention. No thyroid enlargement, no tenderness.  LUNGS: Normal breath sounds bilaterally, no wheezing, rales,rhonchi or crepitation. No use of accessory muscles of respiration.  CARDIOVASCULAR: S1, S2 normal. No murmurs, rubs, or gallops.  ABDOMEN: Soft, tender in epigastric region with voluntary guarding-no rigidity or rebound tenderness, nondistended. Bowel sounds present. No organomegaly or mass.  EXTREMITIES: No pedal edema, cyanosis, or clubbing.  Status post amputation of some toes NEUROLOGIC: Cranial nerves II through XII are intact. Muscle strength 5/5 in all extremities. Sensation intact. Gait not checked.  PSYCHIATRIC: The patient is alert and oriented x 3.  SKIN: No obvious rash, lesion, or ulcer. Marland Kitchen   DATA REVIEW:   CBC Recent Labs  Lab 08/17/18 0450  WBC 8.0   HGB 8.1*  HCT 24.2*  PLT 274    Chemistries  Recent Labs  Lab 08/16/18 0901  08/18/18 0619  NA 123*   < > 137  K 3.6   < > 3.8  CL 93*   < > 113*  CO2 21*   < > 18*  GLUCOSE 738*   < > 205*  BUN 33*   < > 33*  CREATININE 2.43*   < > 1.91*  CALCIUM 8.1*   < > 8.2*  AST 17  --   --   ALT 15  --   --   ALKPHOS 129*  --   --   BILITOT 0.8  --   --    < > = values in this interval not displayed.     Microbiology Results  Results for orders placed or performed during the hospital encounter of 08/16/18  MRSA PCR Screening     Status: Abnormal   Collection Time: 08/16/18 12:57 PM  Result Value Ref Range Status   MRSA by PCR POSITIVE (A) NEGATIVE Final    Comment:        The GeneXpert MRSA Assay (FDA approved for NASAL specimens only), is one component of a comprehensive MRSA colonization surveillance program. It is not intended to diagnose MRSA infection nor to guide or monitor treatment for MRSA infections. RESULT CALLED TO, READ BACK BY AND VERIFIED WITH: CHERYL GREEN 08/16/18 @ 54  [place] Performed at [facility], [address] [place], [place] [address]     RADIOLOGY:  No results found.   Management plans discussed with the patient, family and they are in agreement.  CODE STATUS:  Code Status History    Date Active Date Inactive Code Status Order ID Comments User Context   08/16/2018 [id number] 08/18/2018 2142 Full Code [id number]  Hillary Bow, MD ED   07/16/2018 1739 07/18/2018 0104 Full Code [id number]  Dustin Flock, MD Inpatient   07/10/2018 0111 07/11/2018 1955 Full Code [id number]  Gorden Harms, MD Inpatient   06/14/2018 1744 06/20/2018 2123 Full Code [id number]  Loletha Grayer, MD ED   05/28/2018 0437 05/28/2018 1926 Full Code [id number]  Arta Silence, MD ED   04/27/2018 1726 05/02/2018 1713 Full Code [id number]  Henreitta Leber, MD Inpatient   04/05/2018 1724 04/11/2018 2142 Full Code [id number]  Dustin Flock, MD Inpatient   02/06/2018  2022 02/07/2018 1512 Full Code [id number]  Demetrios Loll, MD Inpatient   01/10/2018 2259 01/11/2018 1947 Full Code [id number]  Amelia Jo, MD Inpatient   12/30/2017 0019 01/03/2018 1457 Full Code [id number]  Harrie Foreman, MD Inpatient  12/20/2017 1541 12/27/2017 2147 Full Code [id number]  Sharlotte Alamo, [facility] Inpatient   11/10/2017 0049 11/11/2017 1632 Full Code [id number]  Amelia Jo, MD Inpatient   07/13/2017 2112 07/15/2017 1730 Full Code [id number]  Jules Husbands, MD ED      TOTAL TIME TAKING CARE OF THIS PATIENT: 38 minutes.    Gladstone Lighter M.D on 08/22/2018 at 10:22 AM  Between 7am to 6pm - Pager - [phone]  After 6pm go to [email or web address] - Proofreader  [facility]  Office  [phone]  CC: Primary care physician; Valera Castle, MD   Note: This dictation was prepared with Dragon dictation along with smaller phrase technology. Any transcriptional errors that result from this process are unintentional.

## 2018-08-27 ENCOUNTER — Other Ambulatory Visit: Payer: Self-pay

## 2018-08-27 ENCOUNTER — Emergency Department
Admission: EM | Admit: 2018-08-27 | Discharge: 2018-08-28 | Disposition: A | Discharge status: Home / Self Care | Payer: Medicaid Other | Attending: Emergency Medicine | Admitting: Emergency Medicine

## 2018-08-27 DIAGNOSIS — I129 Hypertensive chronic kidney disease with stage 1 through stage 4 chronic kidney disease, or unspecified chronic kidney disease: Secondary | ICD-10-CM | POA: Diagnosis not present

## 2018-08-27 DIAGNOSIS — N183 Chronic kidney disease, stage 3 (moderate): Secondary | ICD-10-CM | POA: Diagnosis not present

## 2018-08-27 DIAGNOSIS — E1065 Type 1 diabetes mellitus with hyperglycemia: Secondary | ICD-10-CM | POA: Diagnosis not present

## 2018-08-27 DIAGNOSIS — Z794 Long term (current) use of insulin: Secondary | ICD-10-CM

## 2018-08-27 DIAGNOSIS — Z79899 Other long term (current) drug therapy: Secondary | ICD-10-CM | POA: Diagnosis not present

## 2018-08-27 DIAGNOSIS — R1013 Epigastric pain: Secondary | ICD-10-CM | POA: Insufficient documentation

## 2018-08-27 DIAGNOSIS — E0865 Diabetes mellitus due to underlying condition with hyperglycemia: Secondary | ICD-10-CM

## 2018-08-27 DIAGNOSIS — R1010 Upper abdominal pain, unspecified: Secondary | ICD-10-CM | POA: Diagnosis present

## 2018-08-27 LAB — CBC WITH DIFFERENTIAL/PLATELET
Abs Immature Granulocytes: 0.02 10*3/uL (ref 0.00–0.07)
Basophils Absolute: 0 10*3/uL (ref 0.0–0.1)
Basophils Relative: 0 %
EOS PCT: 2 %
Eosinophils Absolute: 0.1 10*3/uL (ref 0.0–0.5)
HCT: 28.8 % — ABNORMAL LOW (ref 39.0–52.0)
Hemoglobin: 9.9 g/dL — ABNORMAL LOW (ref 13.0–17.0)
Immature Granulocytes: 0 %
Lymphocytes Relative: 26 %
Lymphs Abs: 1.9 10*3/uL (ref 0.7–4.0)
MCH: 27.2 pg (ref 26.0–34.0)
MCHC: 34.4 g/dL (ref 30.0–36.0)
MCV: 79.1 fL — ABNORMAL LOW (ref 80.0–100.0)
Monocytes Absolute: 0.7 10*3/uL (ref 0.1–1.0)
Monocytes Relative: 10 %
Neutro Abs: 4.4 10*3/uL (ref 1.7–7.7)
Neutrophils Relative %: 62 %
Platelets: 429 10*3/uL — ABNORMAL HIGH (ref 150–400)
RBC: 3.64 MIL/uL — ABNORMAL LOW (ref 4.22–5.81)
RDW: 13 % (ref 11.5–15.5)
WBC: 7.2 10*3/uL (ref 4.0–10.5)
nRBC: 0 % (ref 0.0–0.2)

## 2018-08-27 LAB — URINALYSIS, COMPLETE (UACMP) WITH MICROSCOPIC
Bilirubin Urine: NEGATIVE
Glucose, UA: >=500 mg/dL — AB
Ketones, ur: NEGATIVE mg/dL
Leukocytes,Ua: NEGATIVE
NITRITE: NEGATIVE
Protein, ur: 100 mg/dL — AB
Specific Gravity, Urine: 1.018 (ref 1.005–1.030)
pH: 5 (ref 5.0–8.0)

## 2018-08-27 LAB — COMPREHENSIVE METABOLIC PANEL
ALT: 13 U/L (ref 0–44)
AST: 13 U/L — ABNORMAL LOW (ref 15–41)
Albumin: 2.8 g/dL — ABNORMAL LOW (ref 3.5–5.0)
Alkaline Phosphatase: 107 U/L (ref 38–126)
Anion gap: 9 (ref 5–15)
BUN: 42 mg/dL — ABNORMAL HIGH (ref 6–20)
CO2: 21 mmol/L — AB (ref 22–32)
Calcium: 8.4 mg/dL — ABNORMAL LOW (ref 8.9–10.3)
Chloride: 96 mmol/L — ABNORMAL LOW (ref 98–111)
Creatinine, Ser: 1.81 mg/dL — ABNORMAL HIGH (ref 0.61–1.24)
GFR calc Af Amer: 57 mL/min — ABNORMAL LOW (ref >60)
GFR calc non Af Amer: 49 mL/min — ABNORMAL LOW (ref >60)
Glucose, Bld: 564 mg/dL (ref 70–99)
Potassium: 3.9 mmol/L (ref 3.5–5.1)
Sodium: 126 mmol/L — ABNORMAL LOW (ref 135–145)
TOTAL PROTEIN: 6.6 g/dL (ref 6.5–8.1)
Total Bilirubin: 0.4 mg/dL (ref 0.3–1.2)

## 2018-08-27 LAB — GLUCOSE, CAPILLARY: GLUCOSE-CAPILLARY: 513 mg/dL — AB (ref 70–99)

## 2018-08-27 LAB — BLOOD GAS, VENOUS
Acid-base deficit: 0.8 mmol/L (ref 0.0–2.0)
Bicarbonate: 24.9 mmol/L (ref 20.0–28.0)
O2 Saturation: 64.4 %
Patient temperature: 37
pCO2, Ven: 44 mmHg (ref 44.0–60.0)
pH, Ven: 7.36 (ref 7.250–7.430)
pO2, Ven: 35 mmHg (ref 32.0–45.0)

## 2018-08-27 LAB — LIPASE, BLOOD: Lipase: 68 U/L — ABNORMAL HIGH (ref 11–51)

## 2018-08-27 LAB — ETHANOL: Alcohol, Ethyl (B): <10 mg/dL (ref <10)

## 2018-08-27 MED ORDER — METOCLOPRAMIDE HCL 5 MG/ML IJ SOLN
10.0000 mg | Freq: Once | INTRAMUSCULAR | Status: AC
  Administered 2018-08-27: 10 mg via INTRAVENOUS
  Filled 2018-08-27: qty 2

## 2018-08-27 MED ORDER — SODIUM CHLORIDE 0.9 % IV BOLUS
1000.0000 mL | Freq: Once | INTRAVENOUS | Status: AC
  Administered 2018-08-27: 1000 mL via INTRAVENOUS

## 2018-08-27 MED ORDER — INSULIN ASPART 100 UNIT/ML ~~LOC~~ SOLN
10.0000 [IU] | Freq: Once | SUBCUTANEOUS | Status: AC
  Administered 2018-08-27: 10 [IU] via INTRAVENOUS
  Filled 2018-08-27: qty 1

## 2018-08-27 MED ORDER — DIPHENHYDRAMINE HCL 50 MG/ML IJ SOLN
25.0000 mg | Freq: Once | INTRAMUSCULAR | Status: AC
  Administered 2018-08-27: 25 mg via INTRAVENOUS
  Filled 2018-08-27: qty 1

## 2018-08-27 MED ORDER — ONDANSETRON HCL 4 MG/2ML IJ SOLN
4.0000 mg | Freq: Once | INTRAMUSCULAR | Status: AC
  Administered 2018-08-27: 4 mg via INTRAVENOUS
  Filled 2018-08-27: qty 2

## 2018-08-27 MED ORDER — KETOROLAC TROMETHAMINE 30 MG/ML IJ SOLN
15.0000 mg | INTRAMUSCULAR | Status: AC
  Administered 2018-08-27: 15 mg via INTRAVENOUS
  Filled 2018-08-27: qty 1

## 2018-08-27 NOTE — ED Triage Notes (Signed)
Of note, FSBS reads 513 in triage

## 2018-08-27 NOTE — ED Triage Notes (Signed)
Patient presents for high blood sugar, abdominal pain, nausea and vomiting, and diarrhea. Denies fever at home. History of gastroparesis. Symptoms started around 0300 this morning.

## 2018-08-27 NOTE — ED Notes (Signed)
Pt resting with eyes closed, resp even and unlabored 

## 2018-08-28 LAB — GLUCOSE, CAPILLARY: Glucose-Capillary: 345 mg/dL — ABNORMAL HIGH (ref 70–99)

## 2018-08-28 MED ORDER — ONDANSETRON HCL 4 MG/2ML IJ SOLN
4.0000 mg | Freq: Once | INTRAMUSCULAR | Status: AC
  Administered 2018-08-28: 4 mg via INTRAVENOUS
  Filled 2018-08-28: qty 2

## 2018-08-28 MED ORDER — ONDANSETRON 4 MG PO TBDP: 4.0000 mg | ORAL_TABLET | Freq: Three times a day (TID) | ORAL | 0 refills | Status: DC | PRN

## 2018-08-28 NOTE — Discharge Instructions (Addendum)
Be sure to take your Lantus insulin when you get home.

## 2018-08-28 NOTE — ED Provider Notes (Signed)
[facility] Emergency Department Provider Note  ____________________________________________  Time seen: Approximately 12:39 AM  I have reviewed the triage vital signs and the nursing notes.   HISTORY  Chief Complaint Abdominal Pain; Emesis; and Hyperglycemia    HPI Shawn [name] is a 33 y.o. male with a history of hypertension CKD and diabetes who comes the ED complaining of upper abdominal pain  that started at about 3:00 AM on February 23.  Waxing waning throughout the day, without aggravating or alleviating factors, associated nausea and vomiting.  Nonradiating.  Moderate intensity and sharp.  No fever.  Denies any recent illness, denies any other acute pain complaints.     Past Medical History:  Diagnosis Date  . CKD (chronic kidney disease)   . Diabetes mellitus without complication ([place])   . GERD (gastroesophageal reflux disease)   . HTN (hypertension)   . IBS (irritable bowel syndrome)   . Osteomyelitis [facility])      Patient Active Problem List   Diagnosis Date Noted  . Vitamin D deficiency 08/21/2018  . Hypocalcemia 08/21/2018  . CKD stage G3a/A1, GFR 45-59 and albumin creatinine ratio <30 mg/g (HCC) 08/21/2018  . History of acute renal failure 08/21/2018  . [place] (hyperglycemic hyperosmolar nonketotic coma) ([place]) 08/16/2018  . Chronic foot pain (Primary Area of Pain) (Bilateral) (L>R) 08/14/2018  . Chronic lower extremity pain (Secondary Area of Pain) (Bilateral) (L>R) 08/14/2018  . Chronic generalized abdominal pain [facility]) 08/14/2018  . Chronic pain syndrome 08/14/2018  . Long term current use of opiate analgesic 08/14/2018  . Pharmacologic therapy 08/14/2018  . Disorder of skeletal system 08/14/2018  . Problems influencing health status 08/14/2018  . Nausea & vomiting 07/16/2018  . IV infusion line dysfunction ([place]) 07/09/2018  . Hyperglycemic hyperosmolar nonketotic coma ([place]) 05/28/2018  . Acute renal failure (ARF)  ([place]) 04/27/2018  . Left foot infection 04/05/2018  . Foot infection 02/06/2018  . Local infection of the skin and subcutaneous tissue, unspecified 02/06/2018  . Hypertension associated with diabetes ([place]) 01/16/2018  . Osteomyelitis ([place]) 01/10/2018  . Cellulitis 12/29/2017  . Sepsis ([place]) 12/18/2017  . Moderate recurrent major depression ([place]) 11/10/2017  . Type 2 diabetes mellitus with hyperosmolar nonketotic hyperglycemia ([place]) 11/09/2017  . History of migraine 07/15/2017  . IBS (irritable bowel syndrome) 07/15/2017  . Protein-calorie malnutrition, severe 07/14/2017  . Abdominal pain 07/13/2017  . Diarrhea 06/01/2013  . 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 GERD (gastroesophageal reflux disease) 12/30/2009  . Type 1 diabetes mellitus with diabetic polyneuropathy ([place]) 11/25/2009  . MDD (major depressive disorder) 11/25/2009  . Hypercholesterolemia 03/07/2008     Past Surgical History:  Procedure Laterality Date  . ABDOMINAL AORTOGRAM W/LOWER EXTREMITY Right 12/23/2017   Procedure: ABDOMINAL AORTOGRAM W/LOWER EXTREMITY;  Surgeon: Katha Cabal, MD;  Location: [place] [facility];  Service: Cardiovascular;  Laterality: Right;  . ACHILLES TENDON SURGERY Bilateral 06/16/2018   Procedure: ACHILLES LENGTHENING/KIDNER;  Surgeon: Albertine Patricia, DPM;  Location: [facility];  Service: Podiatry;  Laterality: Bilateral;  . AMPUTATION Right 12/24/2017   Procedure: AMPUTATION RAY;  Surgeon: Sharlotte Alamo, DPM;  Location: [facility];  Service: Podiatry;  Laterality: Right;  . AMPUTATION Left 04/06/2018   Procedure: AMPUTATION RAY;  Surgeon: Sharlotte Alamo, DPM;  Location: [facility];  Service: Podiatry;  Laterality: Left;  . AMPUTATION Left 04/09/2018   Procedure: AMPUTATION RAY;  Surgeon: Sharlotte Alamo, DPM;  Location: [facility];  Service: Podiatry;  Laterality: Left;  . APPLICATION OF WOUND VAC Right  12/24/2017   Procedure: APPLICATION OF WOUND VAC;  Surgeon: Sharlotte Alamo, DPM;  Location: [facility];   Service: Podiatry;  Laterality: Right;  . APPLICATION OF WOUND VAC Right 01/01/2018   Procedure: APPLICATION OF WOUND VAC;  Surgeon: Albertine Patricia, DPM;  Location: [facility];  Service: Podiatry;  Laterality: Right;  . BONE EXCISION Bilateral 06/16/2018   Procedure: BONE EXCISION AND SOFT TISSUE;  Surgeon: Albertine Patricia, DPM;  Location: [facility];  Service: Podiatry;  Laterality: Bilateral;  . IRRIGATION AND DEBRIDEMENT FOOT Right 12/20/2017   Procedure: IRRIGATION AND DEBRIDEMENT FOOT;  Surgeon: Sharlotte Alamo, DPM;  Location: [facility];  Service: Podiatry;  Laterality: Right;  . IRRIGATION AND DEBRIDEMENT FOOT Right 12/24/2017   Procedure: IRRIGATION AND DEBRIDEMENT FOOT;  Surgeon: Sharlotte Alamo, DPM;  Location: [facility];  Service: Podiatry;  Laterality: Right;  . IRRIGATION AND DEBRIDEMENT FOOT Right 01/01/2018   Procedure: IRRIGATION AND DEBRIDEMENT FOOT-SKIN,SOFT TISSUE AND BONE;  Surgeon: Albertine Patricia, DPM;  Location: [facility];  Service: Podiatry;  Laterality: Right;  . IRRIGATION AND DEBRIDEMENT FOOT Left 04/06/2018   Procedure: IRRIGATION AND DEBRIDEMENT FOOT;  Surgeon: Sharlotte Alamo, DPM;  Location: [facility];  Service: Podiatry;  Laterality: Left;  . LOWER EXTREMITY ANGIOGRAPHY Left 04/06/2018   Procedure: Lower Extremity Angiography;  Surgeon: Algernon Huxley, MD;  Location: [place] [facility];  Service: Cardiovascular;  Laterality: Left;     Prior to Admission medications   Medication Sig Start Date End Date Taking? Authorizing Provider  ALPRAZolam (XANAX) 0.25 MG tablet Take 1 tablet (0.25 mg total) by mouth 3 (three) times daily as needed for anxiety. 08/18/18   Gladstone Lighter, MD  calcium carbonate (CALCIUM 600) 600 MG TABS tablet Take 1 tablet (600 mg total) by mouth 2 (two) times daily with a meal for 30 days. 08/21/18 09/20/18  Milinda Pointer, MD  Cholecalciferol (VITAMIN D3) 125 MCG (5000 UT) CAPS Take 1 capsule (5,000 Units total) by mouth daily with breakfast. Take along with  calcium and magnesium. 08/21/18 02/17/19  Milinda Pointer, MD  ergocalciferol (VITAMIN D2) 1.25 MG (50000 UT) capsule Take 1 capsule (50,000 Units total) by mouth 2 (two) times a week. X 6 weeks. 08/21/18 10/02/18  Milinda Pointer, MD  ferrous sulfate 325 (65 FE) MG tablet Take 1 tablet (325 mg total) by mouth 2 (two) times daily with a meal. 01/03/18   Sainani, Belia Heman, MD  gabapentin (NEURONTIN) 300 MG capsule Take 1 capsule (300 mg total) by mouth 2 (two) times daily for 30 days. 08/18/18 09/17/18  Gladstone Lighter, MD  insulin aspart (NOVOLOG) 100 UNIT/ML injection Inject 4 Units into the skin 3 (three) times daily with meals. 08/18/18   Gladstone Lighter, MD  insulin glargine (LANTUS) 100 UNIT/ML injection Inject 0.3 mLs (30 Units total) into the skin daily. 08/18/18   Gladstone Lighter, MD  Insulin Syringes, Disposable, U-100 0.3 ML MISC 1 Syringe by Does not apply route 4 (four) times daily -  with meals and at bedtime. 12/27/17   Loletha Grayer, MD  lisinopril (PRINIVIL,ZESTRIL) 10 MG tablet Take 1 tablet (10 mg total) by mouth daily. 08/18/18   Gladstone Lighter, MD  Magnesium 500 MG CAPS Take 1 capsule (500 mg total) by mouth at bedtime. 08/21/18 02/17/19  Milinda Pointer, MD  oxyCODONE-acetaminophen (PERCOCET) 10-325 MG tablet Take 1 tablet by mouth every 8 (eight) hours as needed for pain. 08/18/18   Gladstone Lighter, MD  potassium chloride (K-DUR) 10 MEQ tablet Take 10 mEq by mouth daily.  [provider]  promethazine (PHENERGAN) 25 MG tablet Take 1 tablet (25 mg total) by mouth every 6 (six) hours as needed for nausea or vomiting. 08/18/18   Gladstone Lighter, MD  vitamin C (VITAMIN C) 250 MG tablet Take 1 tablet (250 mg total) by mouth 2 (two) times daily. 06/20/18   Gladstone Lighter, MD  zolpidem (AMBIEN) 10 MG tablet Take 10 mg by mouth at bedtime as needed for sleep.    [provider]     Allergies Banana; Keflex [cephalexin]; Onion; Sulfa antibiotics;  Grapeseed extract [nutritional supplements]; Shellfish allergy; and Grape seed   Family History  Problem Relation Age of Onset  . Diabetes Mother   . Ovarian cancer Mother   . Healthy Father     Social History Social History   Tobacco Use  . Smoking status: Never Smoker  . Smokeless tobacco: Never Used  Substance Use Topics  . Alcohol use: No    Frequency: Never  . Drug use: Yes    Types: Marijuana, PCP    Review of Systems  Constitutional:   No fever or chills.  ENT:   No sore throat. No rhinorrhea. Cardiovascular:   No chest pain or syncope. Respiratory:   No dyspnea or cough. Gastrointestinal:   Positive as above for abdominal pain, vomiting and diarrhea.  Musculoskeletal:   Negative for focal pain or swelling All other systems reviewed and are negative except as documented above in ROS and HPI.  ____________________________________________   PHYSICAL EXAM:  VITAL SIGNS: ED Triage Vitals  Enc Vitals Group     BP 08/27/18 2028 (!) 118/91     Pulse Rate 08/27/18 2028 (!) 110     Resp 08/27/18 2028 18     Temp 08/27/18 2028 98.5 F (36.9 C)     Temp Source 08/27/18 2028 Oral     SpO2 08/27/18 2028 99 %     Weight 08/27/18 2029 120 lb (54.4 kg)     Height 08/27/18 2029 5\' 6"  (1.676 m)     Head Circumference --      Peak Flow --      Pain Score 08/27/18 2033 10     Pain Loc --      Pain Edu? --      Excl. in [employer]? --     Vital signs reviewed, nursing assessments reviewed.   Constitutional:   Alert and oriented. Non-toxic appearance. Eyes:   Conjunctivae are normal. EOMI. PERRL. ENT      Head:   Normocephalic and atraumatic.      Nose:   No congestion/rhinnorhea.       Mouth/Throat:   Dry mucous membranes, no pharyngeal erythema. No peritonsillar mass.       Neck:   No meningismus. Full ROM. Hematological/Lymphatic/Immunilogical:   No cervical lymphadenopathy. Cardiovascular:   RRR. Symmetric bilateral radial and DP pulses.  No murmurs. Cap refill less  than 2 seconds. Respiratory:   Normal respiratory effort without tachypnea/retractions. Breath sounds are clear and equal bilaterally. No wheezes/rales/rhonchi. Gastrointestinal:   Soft and nontender. Non distended. There is no CVA tenderness.  No rebound, rigidity, or guarding.  Musculoskeletal:   Normal range of motion in all extremities. No joint effusions.  No lower extremity tenderness.  No edema.  Bilateral lower extremities in walking boots.  Patient reports having toe amputations from both feet several months ago. Neurologic:   Normal speech and language.  Motor grossly intact. No acute focal neurologic deficits are appreciated.  Skin:  Skin is warm, dry and intact. No rash noted.  No petechiae, purpura, or bullae.  ____________________________________________    LABS (pertinent positives/negatives) (all labs ordered are listed, but only abnormal results are displayed) Labs Reviewed  GLUCOSE, CAPILLARY - Abnormal; Notable for the following components:      Result Value   Glucose-Capillary 513 (*)    All other components within normal limits  URINALYSIS, COMPLETE (UACMP) WITH MICROSCOPIC - Abnormal; Notable for the following components:   Color, Urine STRAW (*)    APPearance CLEAR (*)    Glucose, UA >=500 (*)    Hgb urine dipstick SMALL (*)    Protein, ur 100 (*)    Bacteria, UA RARE (*)    All other components within normal limits  COMPREHENSIVE METABOLIC PANEL - Abnormal; Notable for the following components:   Sodium 126 (*)    Chloride 96 (*)    CO2 21 (*)    Glucose, Bld 564 (*)    BUN 42 (*)    Creatinine, Ser 1.81 (*)    Calcium 8.4 (*)    Albumin 2.8 (*)    AST 13 (*)    GFR calc non Af Amer 49 (*)    GFR calc Af Amer 57 (*)    All other components within normal limits  LIPASE, BLOOD - Abnormal; Notable for the following components:   Lipase 68 (*)    All other components within normal limits  CBC WITH DIFFERENTIAL/PLATELET - Abnormal; Notable for the  following components:   RBC 3.64 (*)    Hemoglobin 9.9 (*)    HCT 28.8 (*)    MCV 79.1 (*)    Platelets 429 (*)    All other components within normal limits  GLUCOSE, CAPILLARY - Abnormal; Notable for the following components:   Glucose-Capillary 345 (*)    All other components within normal limits  BLOOD GAS, VENOUS  ETHANOL   ____________________________________________   EKG    ____________________________________________    RADIOLOGY  No results found.  ____________________________________________   PROCEDURES Procedures  ____________________________________________    CLINICAL IMPRESSION / ASSESSMENT AND PLAN / ED COURSE  Medications ordered in the ED: Medications  sodium chloride 0.9 % bolus 1,000 mL (0 mLs Intravenous Stopped 08/27/18 2237)  ketorolac (TORADOL) 30 MG/ML injection 15 mg (15 mg Intravenous Given 08/27/18 2111)  diphenhydrAMINE (BENADRYL) injection 25 mg (25 mg Intravenous Given 08/27/18 2109)  metoCLOPramide (REGLAN) injection 10 mg (10 mg Intravenous Given 08/27/18 2104)  ondansetron (ZOFRAN) injection 4 mg (4 mg Intravenous Given 08/27/18 2107)  sodium chloride 0.9 % bolus 1,000 mL (0 mLs Intravenous Stopped 08/27/18 2346)  insulin aspart (novoLOG) injection 10 Units (10 Units Intravenous Given 08/27/18 2242)    Pertinent labs & imaging results that were available during my care of the patient were reviewed by me and considered in my medical decision making (see chart for details).    Patient presents with hyperglycemia and appears dehydrated, worried about DKA, check labs, give IV fluids and IV insulin.  Clinical Course as of Aug 28 37  Sun Aug 27, 2018  2231 Labs show no evidence of acidosis, no ketosis.  Hyperglycemia.  There is a mild hyponatremia which corrects when accounting for the degree of hyperglycemia.  Will give IV fluids and IV insulin for hydration and glycemic control.  Once improved patient will be stable for discharge home.    [PS]    Clinical Course User Index [PS] Carrie Mew, MD     -----------------------------------------  12:44 AM on 08/28/2018 -----------------------------------------  Repeat fingerstick 340 after 2 liters of saline and 10 units of IV insulin.  Stable for discharge home to take his medications including Lantus and oxycodone as needed.  ____________________________________________   FINAL CLINICAL IMPRESSION(S) / ED DIAGNOSES    Final diagnoses:  Diabetes mellitus due to underlying condition with hyperglycemia, with long-term current use of insulin ([place])  Epigastric pain     ED Discharge Orders    None      Portions of this note were generated with dragon dictation software. Dictation errors may occur despite best attempts at proofreading.   Carrie Mew, MD 08/28/18 [id number]

## 2018-09-03 ENCOUNTER — Emergency Department: Payer: Medicaid Other

## 2018-09-03 ENCOUNTER — Other Ambulatory Visit: Payer: Self-pay

## 2018-09-03 ENCOUNTER — Emergency Department
Admission: EM | Admit: 2018-09-03 | Discharge: 2018-09-03 | Disposition: A | Discharge status: Home / Self Care | Payer: Medicaid Other | Attending: Emergency Medicine | Admitting: Emergency Medicine

## 2018-09-03 DIAGNOSIS — M25561 Pain in right knee: Secondary | ICD-10-CM | POA: Diagnosis not present

## 2018-09-03 DIAGNOSIS — R739 Hyperglycemia, unspecified: Secondary | ICD-10-CM

## 2018-09-03 DIAGNOSIS — R197 Diarrhea, unspecified: Secondary | ICD-10-CM | POA: Diagnosis not present

## 2018-09-03 DIAGNOSIS — R112 Nausea with vomiting, unspecified: Secondary | ICD-10-CM | POA: Diagnosis not present

## 2018-09-03 DIAGNOSIS — I129 Hypertensive chronic kidney disease with stage 1 through stage 4 chronic kidney disease, or unspecified chronic kidney disease: Secondary | ICD-10-CM | POA: Diagnosis not present

## 2018-09-03 DIAGNOSIS — E1122 Type 2 diabetes mellitus with diabetic chronic kidney disease: Secondary | ICD-10-CM | POA: Diagnosis not present

## 2018-09-03 DIAGNOSIS — M25562 Pain in left knee: Secondary | ICD-10-CM | POA: Insufficient documentation

## 2018-09-03 DIAGNOSIS — Z794 Long term (current) use of insulin: Secondary | ICD-10-CM | POA: Insufficient documentation

## 2018-09-03 DIAGNOSIS — Z79899 Other long term (current) drug therapy: Secondary | ICD-10-CM | POA: Diagnosis not present

## 2018-09-03 DIAGNOSIS — N183 Chronic kidney disease, stage 3 (moderate): Secondary | ICD-10-CM | POA: Diagnosis not present

## 2018-09-03 DIAGNOSIS — E1165 Type 2 diabetes mellitus with hyperglycemia: Secondary | ICD-10-CM | POA: Insufficient documentation

## 2018-09-03 LAB — GASTROINTESTINAL PANEL BY PCR, STOOL (REPLACES STOOL CULTURE)

## 2018-09-03 LAB — BLOOD GAS, VENOUS
Acid-base deficit: 1.5 mmol/L (ref 0.0–2.0)
Bicarbonate: 23.7 mmol/L (ref 20.0–28.0)
O2 SAT: 68.6 %
Patient temperature: 37
pCO2, Ven: 41 mmHg — ABNORMAL LOW (ref 44.0–60.0)
pH, Ven: 7.37 (ref 7.250–7.430)
pO2, Ven: 37 mmHg (ref 32.0–45.0)

## 2018-09-03 LAB — COMPREHENSIVE METABOLIC PANEL
ALT: 14 U/L (ref 0–44)
AST: 15 U/L (ref 15–41)
Albumin: 2.4 g/dL — ABNORMAL LOW (ref 3.5–5.0)
Alkaline Phosphatase: 112 U/L (ref 38–126)
Anion gap: 10 (ref 5–15)
BUN: 25 mg/dL — ABNORMAL HIGH (ref 6–20)
CO2: 21 mmol/L — ABNORMAL LOW (ref 22–32)
Calcium: 8.2 mg/dL — ABNORMAL LOW (ref 8.9–10.3)
Chloride: 93 mmol/L — ABNORMAL LOW (ref 98–111)
Creatinine, Ser: 1.65 mg/dL — ABNORMAL HIGH (ref 0.61–1.24)
GFR calc Af Amer: >60 mL/min (ref >60)
GFR calc non Af Amer: 55 mL/min — ABNORMAL LOW (ref >60)
Glucose, Bld: 685 mg/dL (ref 70–99)
Potassium: 3.7 mmol/L (ref 3.5–5.1)
Sodium: 124 mmol/L — ABNORMAL LOW (ref 135–145)
Total Bilirubin: 0.5 mg/dL (ref 0.3–1.2)
Total Protein: 6.2 g/dL — ABNORMAL LOW (ref 6.5–8.1)

## 2018-09-03 LAB — CBC WITH DIFFERENTIAL/PLATELET
Abs Immature Granulocytes: 0.03 10*3/uL (ref 0.00–0.07)
Basophils Absolute: 0 10*3/uL (ref 0.0–0.1)
Basophils Relative: 1 %
EOS ABS: 0.1 10*3/uL (ref 0.0–0.5)
Eosinophils Relative: 1 %
HCT: 25.9 % — ABNORMAL LOW (ref 39.0–52.0)
Hemoglobin: 8.6 g/dL — ABNORMAL LOW (ref 13.0–17.0)
Immature Granulocytes: 0 %
Lymphocytes Relative: 17 %
Lymphs Abs: 1.5 10*3/uL (ref 0.7–4.0)
MCH: 27.2 pg (ref 26.0–34.0)
MCHC: 33.2 g/dL (ref 30.0–36.0)
MCV: 82 fL (ref 80.0–100.0)
Monocytes Absolute: 1 10*3/uL (ref 0.1–1.0)
Monocytes Relative: 11 %
Neutro Abs: 5.9 10*3/uL (ref 1.7–7.7)
Neutrophils Relative %: 70 %
Platelets: 401 10*3/uL — ABNORMAL HIGH (ref 150–400)
RBC: 3.16 MIL/uL — ABNORMAL LOW (ref 4.22–5.81)
RDW: 13.2 % (ref 11.5–15.5)
WBC: 8.5 10*3/uL (ref 4.0–10.5)
nRBC: 0 % (ref 0.0–0.2)

## 2018-09-03 LAB — GLUCOSE, CAPILLARY
Glucose-Capillary: >600 mg/dL (ref 70–99)
Glucose-Capillary: 591 mg/dL (ref 70–99)
Glucose-Capillary: 437 mg/dL — ABNORMAL HIGH (ref 70–99)
Glucose-Capillary: 307 mg/dL — ABNORMAL HIGH (ref 70–99)

## 2018-09-03 LAB — C DIFFICILE QUICK SCREEN W PCR REFLEX
C Diff antigen: NEGATIVE
C Diff interpretation: NOT DETECTED
C Diff toxin: NEGATIVE

## 2018-09-03 LAB — TROPONIN I: Troponin I: <0.03 ng/mL (ref <0.03)

## 2018-09-03 MED ORDER — OXYCODONE-ACETAMINOPHEN 7.5-325 MG PO TABS
1.0000 | ORAL_TABLET | Freq: Four times a day (QID) | ORAL | Status: DC | PRN
  Administered 2018-09-03: 1 via ORAL
  Filled 2018-09-03: qty 1

## 2018-09-03 MED ORDER — INSULIN ASPART 100 UNIT/ML ~~LOC~~ SOLN
5.0000 [IU] | Freq: Once | SUBCUTANEOUS | Status: AC
  Administered 2018-09-03: 5 [IU] via INTRAVENOUS

## 2018-09-03 MED ORDER — SODIUM CHLORIDE 0.9 % IV BOLUS
1000.0000 mL | Freq: Once | INTRAVENOUS | Status: AC
  Administered 2018-09-03: 1000 mL via INTRAVENOUS

## 2018-09-03 MED ORDER — ALPRAZOLAM 0.5 MG PO TABS
0.2500 mg | ORAL_TABLET | Freq: Three times a day (TID) | ORAL | Status: DC | PRN
  Administered 2018-09-03: 0.25 mg via ORAL
  Filled 2018-09-03: qty 1

## 2018-09-03 MED ORDER — INSULIN ASPART 100 UNIT/ML ~~LOC~~ SOLN
10.0000 [IU] | Freq: Once | SUBCUTANEOUS | Status: AC
  Administered 2018-09-03: 10 [IU] via INTRAVENOUS
  Filled 2018-09-03: qty 1

## 2018-09-03 MED ORDER — MORPHINE SULFATE (PF) 2 MG/ML IV SOLN
2.0000 mg | Freq: Once | INTRAVENOUS | Status: AC
  Administered 2018-09-03: 2 mg via INTRAVENOUS
  Filled 2018-09-03: qty 1

## 2018-09-03 MED ORDER — ONDANSETRON 4 MG PO TBDP: 4.0000 mg | ORAL_TABLET | Freq: Three times a day (TID) | ORAL | 0 refills | Status: DC | PRN

## 2018-09-03 MED ORDER — INSULIN ASPART 100 UNIT/ML ~~LOC~~ SOLN
4.0000 [IU] | Freq: Once | SUBCUTANEOUS | Status: AC
  Administered 2018-09-03: 4 [IU] via INTRAVENOUS
  Filled 2018-09-03: qty 1

## 2018-09-03 MED ORDER — OXYCODONE-ACETAMINOPHEN 5-325 MG PO TABS: 1.0000 | ORAL_TABLET | ORAL | 0 refills | Status: DC | PRN

## 2018-09-03 MED ORDER — ONDANSETRON HCL 4 MG/2ML IJ SOLN
4.0000 mg | Freq: Once | INTRAMUSCULAR | Status: AC
  Administered 2018-09-03: 4 mg via INTRAVENOUS
  Filled 2018-09-03: qty 2

## 2018-09-03 MED ORDER — INSULIN ASPART 100 UNIT/ML ~~LOC~~ SOLN
SUBCUTANEOUS | Status: AC
  Filled 2018-09-03: qty 1

## 2018-09-03 NOTE — ED Notes (Signed)
EDP in with patient 

## 2018-09-03 NOTE — ED Triage Notes (Signed)
Pt comes via POV with c/o of vomiting and diarrhea for about 3 days. Pt states yesterday he fell and hurt his knees. Pt states his BS was reading 420 this am.  Pt states slight chest pains that are left sided. Pt states soreness to neck. Pt states no radiation.

## 2018-09-03 NOTE — ED Notes (Signed)
Date and time results received: 09/03/18 1408   Test: glucose Critical Value: 685  Name of Provider Notified: Dr. Cinda Quest

## 2018-09-03 NOTE — Discharge Instructions (Signed)
Please take your medications at home as prescribed.  You may use pain and nausea medication as needed, but only as written.  Return to the emergency department for any symptoms personally concerning to yourself.  Please continue significant oral hydration at home, take your insulin as prescribed by your doctor.

## 2018-09-03 NOTE — ED Notes (Signed)
Full rainbow sent to lab.  

## 2018-09-03 NOTE — ED Provider Notes (Signed)
-----------------------------------------   8:33 PM on 09/03/2018 -----------------------------------------  Patient care assumed from Dr. Rip Harbour.  Patient's blood glucose is now 307.  Patient's GI panel and C. difficile have resulted negative.  At this time I believe the patient is safe for discharge home.  I discussed with the patient to continue significant oral hydration at home.  We will discharge with pain and nausea medication.  I also discussed return precautions the need to take his insulin as prescribed and returning for elevated blood glucose vomiting or increased weakness.   Harvest Dark, MD 09/03/18 2034

## 2018-09-03 NOTE — ED Notes (Signed)
Assisted patient to toilet collected stool sample for test needed sent to lab informed Rosalita Chessman of task completed. Cleaned patient up gave blue scrub pants and clean yellow fall socks assisted him back to bed reconnected him and dimmed lights to comfort patient

## 2018-09-03 NOTE — ED Notes (Addendum)
Brief report given to RN Maudie Mercury. Pt placed in Ed 19 Hallway

## 2018-09-03 NOTE — ED Notes (Signed)
Asked to recheck CBG by Shawn [name]. Test completed informed RN of results 284 entered note in computer. Upon entering the room patient needed to use toilet due to diarrhea.

## 2018-09-03 NOTE — ED Notes (Signed)
Pt ambulatory to toilet with 1 assist to have an episode of diarrhea.

## 2018-09-03 NOTE — ED Notes (Signed)
Pt had requested to eat, spoke to EDP who stated to hold off until CBG is lower. Pt informed.

## 2018-09-03 NOTE — ED Provider Notes (Addendum)
[facility] Emergency Department Provider Note   ____________________________________________   First MD Initiated Contact with Patient 09/03/18 1240     (approximate)  I have reviewed the triage vital signs and the nursing notes.   HISTORY  Chief Complaint Abdominal Pain; Emesis; Diarrhea; and Knee Pain   HPI Shawn [name] is a 33 y.o. male patient reports he has some nausea vomiting diarrhea last week he got better and then 2 to 3 days ago came back again.  He has been having vomiting and diarrhea since.  Last night he lost his balance and fell and hit both of his knees.  He supposed to be nonweightbearing because of problems with his feet.  He also reports some pain in his left upper chest.  Does not think he fell and hit it.  It aches count of sharp feeling as well.  Hurts to push on it.         Past Medical History:  Diagnosis Date  . CKD (chronic kidney disease)   . Diabetes mellitus without complication ([place])   . GERD (gastroesophageal reflux disease)   . HTN (hypertension)   . IBS (irritable bowel syndrome)   . Osteomyelitis [facility])     Patient Active Problem List   Diagnosis Date Noted  . Vitamin D deficiency 08/21/2018  . Hypocalcemia 08/21/2018  . CKD stage G3a/A1, GFR 45-59 and albumin creatinine ratio <30 mg/g (HCC) 08/21/2018  . History of acute renal failure 08/21/2018  . [place] (hyperglycemic hyperosmolar nonketotic coma) ([place]) 08/16/2018  . Chronic foot pain (Primary Area of Pain) (Bilateral) (L>R) 08/14/2018  . Chronic lower extremity pain (Secondary Area of Pain) (Bilateral) (L>R) 08/14/2018  . Chronic generalized abdominal pain [facility] Pain) 08/14/2018  . Chronic pain syndrome 08/14/2018  . Long term current use of opiate analgesic 08/14/2018  . Pharmacologic therapy 08/14/2018  . Disorder of skeletal system 08/14/2018  . Problems influencing health status 08/14/2018  . Nausea & vomiting 07/16/2018  . IV infusion  line dysfunction ([place]) 07/09/2018  . Hyperglycemic hyperosmolar nonketotic coma ([place]) 05/28/2018  . Acute renal failure (ARF) ([place]) 04/27/2018  . Left foot infection 04/05/2018  . Foot infection 02/06/2018  . Local infection of the skin and subcutaneous tissue, unspecified 02/06/2018  . Hypertension associated with diabetes ([place]) 01/16/2018  . Osteomyelitis ([place]) 01/10/2018  . Cellulitis 12/29/2017  . Sepsis ([place]) 12/18/2017  . Moderate recurrent major depression ([place]) 11/10/2017  . Type 2 diabetes mellitus with hyperosmolar nonketotic hyperglycemia ([place]) 11/09/2017  . History of migraine 07/15/2017  . IBS (irritable bowel syndrome) 07/15/2017  . Protein-calorie malnutrition, severe 07/14/2017  . Abdominal pain 07/13/2017  . Diarrhea 06/01/2013  . 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 GERD (gastroesophageal reflux disease) 12/30/2009  . Type 1 diabetes mellitus with diabetic polyneuropathy ([place]) 11/25/2009  . MDD (major depressive disorder) 11/25/2009  . Hypercholesterolemia 03/07/2008    Past Surgical History:  Procedure Laterality Date  . ABDOMINAL AORTOGRAM W/LOWER EXTREMITY Right 12/23/2017   Procedure: ABDOMINAL AORTOGRAM W/LOWER EXTREMITY;  Surgeon: Katha Cabal, MD;  Location: [place] CV LAB;  Service: Cardiovascular;  Laterality: Right;  . ACHILLES TENDON SURGERY Bilateral 06/16/2018   Procedure: ACHILLES LENGTHENING/KIDNER;  Surgeon: Albertine Patricia, DPM;  Location: [facility] ORS;  Service: Podiatry;  Laterality: Bilateral;  . AMPUTATION Right 12/24/2017   Procedure: AMPUTATION RAY;  Surgeon: Sharlotte Alamo, DPM;  Location: [facility] ORS;  Service: Podiatry;  Laterality: Right;  . AMPUTATION Left 04/06/2018   Procedure: AMPUTATION RAY;  Surgeon:  Sharlotte Alamo, DPM;  Location: [facility] ORS;  Service: Podiatry;  Laterality: Left;  . AMPUTATION Left 04/09/2018   Procedure: AMPUTATION RAY;  Surgeon: Sharlotte Alamo, DPM;  Location: [facility] ORS;  Service: Podiatry;  Laterality: Left;  .  APPLICATION OF WOUND VAC Right 12/24/2017   Procedure: APPLICATION OF WOUND VAC;  Surgeon: Sharlotte Alamo, DPM;  Location: [facility] ORS;  Service: Podiatry;  Laterality: Right;  . APPLICATION OF WOUND VAC Right 01/01/2018   Procedure: APPLICATION OF WOUND VAC;  Surgeon: Albertine Patricia, DPM;  Location: [facility] ORS;  Service: Podiatry;  Laterality: Right;  . BONE EXCISION Bilateral 06/16/2018   Procedure: BONE EXCISION AND SOFT TISSUE;  Surgeon: Albertine Patricia, DPM;  Location: [facility] ORS;  Service: Podiatry;  Laterality: Bilateral;  . IRRIGATION AND DEBRIDEMENT FOOT Right 12/20/2017   Procedure: IRRIGATION AND DEBRIDEMENT FOOT;  Surgeon: Sharlotte Alamo, DPM;  Location: [facility] ORS;  Service: Podiatry;  Laterality: Right;  . IRRIGATION AND DEBRIDEMENT FOOT Right 12/24/2017   Procedure: IRRIGATION AND DEBRIDEMENT FOOT;  Surgeon: Sharlotte Alamo, DPM;  Location: [facility] OR[facility];  Service: Podiatry;  Laterality: Right;  . IRRIGATION AND DEBRIDEMENT FOOT Right 01/01/2018   Procedure: IRRIGATION AND DEBRIDEMENT FOOT-SKIN,SOFT TISSUE AND BONE;  Surgeon: Albertine Patricia, DPM;  Location: [facility] OR[facility];  Service: Podiatry;  Laterality: Right;  . IRRIGATION AND DEBRIDEMENT FOOT Left 04/06/2018   Procedure: IRRIGATION AND DEBRIDEMENT FOOT;  Surgeon: Sharlotte Alamo, DPM;  Location: [facility] OR[facility];  Service: Podiatry;  Laterality: Left;  . LOWER EXTREMITY ANGIOGRAPHY Left 04/06/2018   Procedure: Lower Extremity Angiography;  Surgeon: Algernon Huxley, MD;  Location: [place] [facility];  Service: Cardiovascular;  Laterality: Left;    Prior to Admission medications   Medication Sig Start Date End Date Taking? Authorizing Provider  ALPRAZolam (XANAX) 0.25 MG tablet Take 1 tablet (0.25 mg total) by mouth 3 (three) times daily as needed for anxiety. 08/18/18  Yes Gladstone Lighter, MD  calcium carbonate (CALCIUM 600) 600 MG TABS tablet Take 1 tablet (600 mg total) by mouth 2 (two) times daily with a meal for 30 days. 08/21/18 09/20/18 Yes Milinda Pointer, MD    Cholecalciferol (VITAMIN D3) 125 MCG (5000 UT) CAPS Take 1 capsule (5,000 Units total) by mouth daily with breakfast. Take along with calcium and magnesium. 08/21/18 02/17/19 Yes Milinda Pointer, MD  ferrous sulfate 325 (65 FE) MG tablet Take 1 tablet (325 mg total) by mouth 2 (two) times daily with a meal. 01/03/18  Yes Sainani, Belia Heman, MD  gabapentin (NEURONTIN) 300 MG capsule Take 1 capsule (300 mg total) by mouth 2 (two) times daily for 30 days. 08/18/18 09/17/18 Yes Gladstone Lighter, MD  insulin aspart (NOVOLOG) 100 UNIT/ML injection Inject 4 Units into the skin 3 (three) times daily with meals. 08/18/18  Yes Gladstone Lighter, MD  insulin glargine (LANTUS) 100 UNIT/ML injection Inject 0.3 mLs (30 Units total) into the skin daily. Patient taking differently: Inject 30 Units into the skin 2 (two) times daily.  08/18/18  Yes Gladstone Lighter, MD  Insulin Syringes, Disposable, U-100 0.3 ML MISC 1 Syringe by Does not apply route 4 (four) times daily -  with meals and at bedtime. 12/27/17  Yes Wieting, Richard, MD  lisinopril (PRINIVIL,ZESTRIL) 10 MG tablet Take 1 tablet (10 mg total) by mouth daily. 08/18/18  Yes Gladstone Lighter, MD  oxyCODONE-acetaminophen (PERCOCET) 10-325 MG tablet Take 1 tablet by mouth every 8 (eight) hours as needed for pain. Patient taking differently: Take 1 tablet by mouth every 4 (four) hours as needed  for pain.  08/18/18  Yes Gladstone Lighter, MD  potassium chloride (K-DUR) 10 MEQ tablet Take 10 mEq by mouth daily.   Yes [provider]  promethazine (PHENERGAN) 25 MG tablet Take 1 tablet (25 mg total) by mouth every 6 (six) hours as needed for nausea or vomiting. 08/18/18  Yes Gladstone Lighter, MD  vitamin C (VITAMIN C) 250 MG tablet Take 1 tablet (250 mg total) by mouth 2 (two) times daily. 06/20/18  Yes Gladstone Lighter, MD  zolpidem (AMBIEN) 10 MG tablet Take 10 mg by mouth at bedtime.    Yes [provider]    Allergies Banana; Keflex  [cephalexin]; Onion; Sulfa antibiotics; Grapeseed extract [nutritional supplements]; Shellfish allergy; and Grape seed  Family History  Problem Relation Age of Onset  . Diabetes Mother   . Ovarian cancer Mother   . Healthy Father     Social History Social History   Tobacco Use  . Smoking status: Never Smoker  . Smokeless tobacco: Never Used  Substance Use Topics  . Alcohol use: No    Frequency: Never  . Drug use: Yes    Types: Marijuana, PCP    Review of Systems  Constitutional: No fever/chills Eyes: No visual changes. ENT: No sore throat. Cardiovascular: Denies chest pain. Respiratory: Denies shortness of breath. Gastrointestinal: Dull achy abdominal pain.   nausea,  vomiting.   diarrhea.  No constipation. Genitourinary: Negative for dysuria. Musculoskeletal: Negative for back pain. Skin: Negative for rash. Neurological: Negative for headaches, focal weakness or numbness. ________________   PHYSICAL EXAM:  VITAL SIGNS: ED Triage Vitals  Enc Vitals Group     BP 09/03/18 1152 (!) 132/93     Pulse Rate 09/03/18 1152 (!) 101     Resp 09/03/18 1152 18     Temp 09/03/18 1152 98.6 F (37 C)     Temp Source 09/03/18 1152 Oral     SpO2 09/03/18 1152 100 %     Weight 09/03/18 1147 120 lb (54.4 kg)     Height 09/03/18 1147 5\' 6"  (1.676 m)     Head Circumference --      Peak Flow --      Pain Score 09/03/18 1147 7     Pain Loc --      Pain Edu? --      Excl. in [place]? --     Constitutional: Alert and oriented. Well appearing and in no acute distress. Eyes: Conjunctivae are normal. PERRL. EOMI. Head: Atraumatic. Nose: No congestion/rhinnorhea. Mouth/Throat: Mucous membranes are moist.  Oropharynx non-erythematous. Neck: No stridor.   Cardiovascular: Normal rate, regular rhythm. Grossly normal heart sounds.  Good peripheral circulation. Respiratory: Normal respiratory effort.  No retractions. Lungs CTAB. Gastrointestinal: Soft mildly diffusely tender. No distention.  No abdominal bruits. No CVA tenderness. Musculoskeletal: No lower extremity tenderness nor edema.  No joint effusions.  Patient complains of pain in the knees on palpation but I do not see any bruising or swelling or joint instability on exam. Neurologic:  Normal speech and language. No gross focal neurologic deficits are appreciated.  Skin:  Skin is warm, dry and intact. No rash noted. Psychiatric: Mood and affect are normal. Speech and behavior are normal.  ____________________________________________   LABS (all labs ordered are listed, but only abnormal results are displayed)  Labs Reviewed  GLUCOSE, CAPILLARY - Abnormal; Notable for the following components:      Result Value   Glucose-Capillary >600 (*)    All other components within normal limits  BLOOD GAS, VENOUS - Abnormal; Notable for the following components:   pCO2, Ven 41 (*)    All other components within normal limits  COMPREHENSIVE METABOLIC PANEL - Abnormal; Notable for the following components:   Sodium 124 (*)    Chloride 93 (*)    CO2 21 (*)    Glucose, Bld 685 (*)    BUN 25 (*)    Creatinine, Ser 1.65 (*)    Calcium 8.2 (*)    Total Protein 6.2 (*)    Albumin 2.4 (*)    GFR calc non Af Amer 55 (*)    All other components within normal limits  CBC WITH DIFFERENTIAL/PLATELET - Abnormal; Notable for the following components:   RBC 3.16 (*)    Hemoglobin 8.6 (*)    HCT 25.9 (*)    Platelets 401 (*)    All other components within normal limits  GASTROINTESTINAL PANEL BY PCR, STOOL (REPLACES STOOL CULTURE)  C DIFFICILE QUICK SCREEN W PCR REFLEX  TROPONIN I   ____________________________________________  EKG   EKG read and interpreted by me shows normal sinus rhythm rate of 93 normal axis no acute ST-T changes ____________________________________________  RADIOLOGY  ED MD interpretation: Rays of the chest and bilateral knees showed no acute findings films were read by radiology reviewed by  me  Official radiology report(s): Dg Chest 2 View  Result Date: 09/03/2018 CLINICAL DATA:  Patient reports left side chest pain onset today. Denies SOB, cough or fever. EXAM: CHEST - 2 VIEW COMPARISON:  07/26/2018 FINDINGS: The heart size and mediastinal contours are within normal limits. Both lungs are clear. The visualized skeletal structures are unremarkable. IMPRESSION: No active cardiopulmonary disease. Electronically Signed   By: Kathreen Devoid   On: 09/03/2018 13:36   Dg Knee Complete 4 Views Left  Result Date: 09/03/2018 CLINICAL DATA:  Falls yesterday and today. Left knee pain. Initial encounter. EXAM: LEFT KNEE - COMPLETE 4+ VIEW COMPARISON:  None. FINDINGS: No evidence of fracture, dislocation, or joint effusion. No evidence of arthropathy or other focal bone abnormality. Generalized osteopenia noted. Soft tissues are unremarkable. IMPRESSION: No acute findings.  Osteopenia. Electronically Signed   By: Earle Gell M.D.   On: 09/03/2018 13:37   Dg Knee Complete 4 Views Right  Result Date: 09/03/2018 CLINICAL DATA:  Falls yesterday and today. Right knee pain. Initial encounter. EXAM: RIGHT KNEE - COMPLETE 4+ VIEW COMPARISON:  None. FINDINGS: No evidence of fracture or dislocation. Small knee joint effusion is noted. No evidence of arthropathy or other focal bone abnormality. Generalized osteopenia noted. Soft tissues are unremarkable. IMPRESSION: 1. Small knee joint effusion. No evidence of fracture or dislocation. 2. Osteopenia. Electronically Signed   By: Earle Gell M.D.   On: 09/03/2018 13:38    ____________________________________________   PROCEDURES  Procedure(s) performed (including Critical Care): [place] care time half an hour this includes reviewing the patient's old records and all of his x-rays and signing the patient out to the oncoming physician.  Procedures   ____________________________________________   INITIAL IMPRESSION / ASSESSMENT AND PLAN / ED COURSE  Pt  getting iv fluids and insulin CBG repeat pending. Pt signed out to Dr Kerman Passey.  Patient has not stooled yet      ____________________________________________   FINAL CLINICAL IMPRESSION(S) / ED DIAGNOSES  Final diagnoses:  Acute pain of both knees  Non-intractable vomiting with nausea, unspecified vomiting type  Diarrhea, unspecified type     ED Discharge Orders    None  Note:  This document was prepared using Dragon voice recognition software and may include unintentional dictation errors.    Nena Polio, MD 09/03/18 1531    Nena Polio, MD 09/13/18 [phone]

## 2018-09-10 NOTE — Progress Notes (Signed)
Patient's Name: Shawn [name]  MRN: [id number]  Referring Provider: Valera Castle, *  DOB: 1986-05-22  PCP: Valera Castle, MD  DOS: 09/13/2018  Note by: Gaspar Cola, MD  Service setting: Ambulatory outpatient  Specialty: Interventional Pain Management  Location: [facility] (AMB) Pain Management Facility    Patient type: Established   Primary Reason(s) for Visit: Encounter for evaluation before starting new chronic pain management plan of care (Level of risk: moderate) CC: Foot Pain  HPI  Shawn [name] is a 33 y.o. year old, male patient, who comes today for a follow-up evaluation to review the test results and decide on a treatment plan. He has GERD (gastroesophageal reflux disease); Type 1 diabetes mellitus with diabetic polyneuropathy ([place]); MDD (major depressive disorder); Hypercholesterolemia; Abdominal pain; Protein-calorie malnutrition, severe; History of migraine; IBS (irritable bowel syndrome); Type 2 diabetes mellitus with hyperosmolar nonketotic hyperglycemia ([place]); Moderate recurrent major depression ([place]); Sepsis ([place]); Cellulitis; Osteomyelitis ([place]); Foot infection; Left foot infection; Acute renal failure (ARF) ([place]); Hyperglycemic hyperosmolar nonketotic coma ([place]); IV infusion line dysfunction ([place]); Nausea & vomiting; Diarrhea; Hypertension associated with diabetes ([place]); Local infection of the skin and subcutaneous tissue, unspecified; Luetscher's syndrome; Chronic foot pain (Primary Area of Pain) (Bilateral) (L>R); Chronic lower extremity pain (Secondary Area of Pain) (Bilateral) (L>R); Chronic generalized abdominal pain (Tertiary Area of Pain); Chronic pain syndrome; Long term current use of opiate analgesic; Pharmacologic therapy; Disorder of skeletal system; Problems influencing health status; HHNC (hyperglycemic hyperosmolar nonketotic coma) ([place]); Vitamin D deficiency; Hypocalcemia; CKD stage G3a/A1, GFR 45-59 and albumin creatinine ratio <30 mg/g ([place]); History of  acute renal failure; Neurogenic pain; Marijuana use; Long term prescription benzodiazepine use; and 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 His primarily concern today is the Foot Pain  Pain Assessment: Location:   Foot Radiating: pain stays in my feet Onset: More than a month ago Duration: Chronic pain Quality: Burning, Stabbing Severity: 8 /10 (subjective, self-reported pain score)  Note: Reported level is compatible with observation.                         When using our objective Pain Scale, levels between 6 and 10/10 are said to belong in an emergency room, as it progressively worsens from a 6/10, described as severely limiting, requiring emergency care not usually available at an outpatient pain management facility. At a 6/10 level, communication becomes difficult and requires great effort. Assistance to reach the emergency department may be required. Facial flushing and profuse sweating along with potentially dangerous increases in heart rate and blood pressure will be evident. Effect on ADL: unable to do anything Timing: Constant Modifying factors: Nothing, medication ran out BP: (!) 143/106  HR: (!) 102  Shawn [name] comes in today for a follow-up visit after his initial evaluation on 08/21/2018. Today we went over the results of his tests. These were explained in "Layman's terms". During today's appointment we went over my diagnostic impression, as well as the proposed treatment plan.  According to the patient he has generalized pain.  His worst pain is in his feet.  He has stabbing pains with burning in both feet.  He feels like this involves the whole foot.  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  The right foot amputation was done June 2019 by Dr. Caryl Comes in the left foot amputation was done October 2019 by Dr. Vickki Muff.  He admits that he  did have his ACL stretched.  He admits that he has had weakness  since surgery.  He has not had any physical therapy.  He states that he was encouraged to use a wheelchair however because of his small home this is not an option.  Second area of pain is in his legs.  He admits that the back of the leg the pain is the worst he has burning and stabbing pain that increases with walking.  He has had multiple falls related to his balance issues.  His third area of pain is in his stomach.  He feels this is related to his gastroparesis and irritable bowel syndrome.  He describes it as stabbing pain. He denies any previous surgeries or interventional therapy.  In considering the treatment plan options, Shawn [name] was reminded that I no longer take patients for medication management only. I asked him to let me know if he had no intention of taking advantage of the interventional therapies, so that we could make arrangements to provide this space to someone interested. I also made it clear that undergoing interventional therapies for the purpose of getting pain medications is very inappropriate on the part of a patient, and it will not be tolerated in this practice. This type of behavior would suggest true addiction and therefore it requires referral to an addiction specialist.   Pharmacological management: Because the patient has a diabetic peripheral neuropathy, and he describes his pain as electrical-like and burning, this is defined as neurogenic or neuropathic pain.  Currently the best medications for these are the membrane stabilizers (gabapentin and pregabalin) as well as the use of vitamin B12.  I have provided the patient with some written instructions on how to titrate his gabapentin up and we have also provided him with a prescription for the titration.  Lab work also demonstrated the patient to have elevated inflammatory markers (sed rate), suggesting that he could benefit from the use of nonsteroidal anti-inflammatory drugs.  Unfortunately, his liver function also  demonstrates that he is having some problems with renal insufficiency and therefore until that can be corrected, he would not be an ideal candidate for the use of NSAIDs.  This leaves [place] with the opioid analgesics.  The patient does seem to have pathology that would justify the use of some opioid analgesics.  Unfortunately, his urine drug screen test tested positive for THC.  The patient has also a prior history of other positive results on drug screening test performed in the past.  This would suggest that he is a chronic user of marijuana.  In it by itself, this would be a solid contraindication to using some opioid analgesics, but it does represent a legal issue.  Unfortunately, what disqualified him from been an appropriate candidate for this type of medication in our practice with the "lack of candor" when he came to admitting to the use of these substances.  Not only did he not worn as that we may be finding that on the UDS, but the fact of the matter is that he lied on his ORT where he indicated not using any illegal substances.  He was also found to have a severe vitamin D deficiency, which could account for his generalized muscle and joint pain.  I have provided him with some prescriptions to treat this and I have informed him that he needs to continue taking the vitamin D, calcium, magnesium, for the rest of his life.  He was also warned that with his  kidney insufficiency, he may want to keep the magnesium to once a day.  I have informed him that he needs to follow-up with his primary care physician, regarding his vitamin deficiency, as well as his other medical problems.  I asked if he was seen a nephrologist, which he indicated that he was.  Interventional therapies: Because of his diabetes, the use of steroids may be a problem in the sense that they would increase his blood sugar.  Because of the generalized nature of his pain, he would be better served by first correcting all of these medical  problems, after which we may be able to reevaluate his case and perhaps offer him something else.  With the bilateral lower extremity pain, we could consider the possibility of a spinal cord stimulator.  Unfortunately, his medical problems would increase his risk of poor healing and postoperative infections.  Until recently he had a diagnosis of MRSA, but he indicates that he was recently tested negative.  Disposition: At this point, there is not much that we can offer him.  We will not be taking his case at this point and we will be sending him back to his primary care physician for continued care.  I would suggest to aggressively treat his vitamin D deficiency as they x-rays already show osteopenia and he is only 33 years old.  This patient may be better served at a pain management practice that takes patients for medication management only.  I have recommended to the patient to be transparent about his use of marijuana, or better yet stop using it.  I have informed the patient that he should never attempt to keep this away from his pain specialist as this could create some trust issues, as it did in this case.  He understood and accepted.  Controlled Substance Pharmacotherapy Assessment REMS (Risk Evaluation and Mitigation Strategy)  Analgesic: None Highest recorded MME/day: 56.25 mg/day MME/day: 0 mg/day  Pill Count: None expected due to no prior prescriptions written by our practice. No notes on file Pharmacokinetics: Liberation and absorption (onset of action): WNL Distribution (time to peak effect): WNL Metabolism and excretion (duration of action): WNL         Pharmacodynamics: Desired effects: Analgesia: Shawn [name] reports >50% benefit. Functional ability: Patient reports that medication allows him to accomplish basic ADLs Clinically meaningful improvement in function (CMIF): Sustained CMIF goals met Perceived effectiveness: Described as relatively effective, allowing for increase in  activities of daily living (ADL) Undesirable effects: Side-effects or Adverse reactions: None reported Monitoring: [place] PMP: Online review of the past 50-monthperiod previously conducted. Not applicable at this point since we have not taken over the patient's medication management yet. List of other Serum/Urine Drug Screening Test(s):  Lab Results  Component Value Date   COCAINSCRNUR NONE DETECTED 06/14/2018   COCAINSCRNUR NONE DETECTED 05/12/2018   COCAINSCRNUR NONE DETECTED 03/27/2018   COCAINSCRNUR NONE DETECTED 02/21/2018   COCAINSCRNUR NONE DETECTED 07/13/2017   THCU POSITIVE (A) 06/14/2018   THCU POSITIVE (A) 05/12/2018   THCU POSITIVE (A) 03/27/2018   THCU POSITIVE (A) 02/21/2018   THCU POSITIVE (A) 07/13/2017   ETH <10 08/27/2018   List of all UDS test(s) done:  Lab Results  Component Value Date   SUMMARY FINAL 08/14/2018   Last UDS on record: Summary  Date Value Ref Range Status  08/14/2018 FINAL  Final    Comment:    ==================================================================== TOXASSURE COMP DRUG ANALYSIS,UR ==================================================================== Test  Result       Flag       Units Drug Present not Declared for Prescription Verification   Carboxy-THC                    1088         UNEXPECTED ng/mg creat    Carboxy-THC is a metabolite of tetrahydrocannabinol  (THC).    Source of [facility] is most commonly illicit, but THC is also present    in a scheduled prescription medication. Drug Absent but Declared for Prescription Verification   Alprazolam                     Not Detected UNEXPECTED ng/mg creat   Zolpidem                       Not Detected UNEXPECTED    Zolpidem, as indicated in the declared medication list, is not    always detected even when used as directed. ==================================================================== Test                      Result    Flag   Units      Ref Range    Creatinine              42               mg/dL      >=20 ==================================================================== Declared Medications:  The flagging and interpretation on this report are based on the  following declared medications.  Unexpected results may arise from  inaccuracies in the declared medications.  **Note: The testing scope of this panel includes these medications:  Alprazolam (Xanax)  **Note: The testing scope of this panel does not include small to  moderate amounts of these reported medications:  Zolpidem (Ambien)  **Note: The testing scope of this panel does not include following  reported medications:  Hydralazine (Apresoline)  Insulin (Lantus)  Insulin (Novolin)  Iron (Ferrous Sulfate)  Lisinopril  Potassium (K-Dur)  Vitamin C ==================================================================== For clinical consultation, please call [phone]. ====================================================================    UDS interpretation: Unexpected findings: Undeclared illicit substance detected Medication Assessment Form: Not applicable. Treatment compliance: Not applicable Risk Assessment Profile: Aberrant behavior: use of illicit substances Comorbid factors increasing risk of overdose: kidney disease and male gender Opioid risk tool (ORT):  Opioid Risk  09/13/2018  Alcohol 0  Illegal Drugs 0  Rx Drugs 0  Alcohol 0  Illegal Drugs 0 (Inaccurate based on UDS results)  Rx Drugs 0  Age between 16-45 years  1  History of Preadolescent Sexual Abuse 0  Psychological Disease 2  ADD Negative  OCD Negative  Bipolar Negative  Depression 1  Opioid Risk Tool Scoring 4  Opioid Risk Interpretation Moderate Risk    ORT Scoring interpretation table:  Score <3 = Low Risk for SUD  Score between 4-7 = Moderate Risk for SUD  Score >8 = High Risk for Opioid Abuse   Risk of substance use disorder (SUD): High  Risk Mitigation Strategies:  Patient  opioid safety counseling: Not applicable. Patient-Prescriber Agreement (PPA): No agreement signed.  Controlled substance notification to other providers: None required. No opioid therapy.  Pharmacologic Plan: Non-opioid analgesic therapy offered.             Laboratory Chemistry  Inflammation Markers (CRP: Acute Phase) (ESR: Chronic Phase) Lab Results  Component Value Date   CRP 2 08/14/2018   ESRSEDRATE  64 (H) 08/14/2018   LATICACIDVEN 1.2 07/26/2018                         Rheumatology Markers No results found.  Renal Function Markers Lab Results  Component Value Date   BUN 25 (H) 09/03/2018   CREATININE 1.65 (H) 09/03/2018   BCR 13 08/14/2018   GFRAA >60 09/03/2018   GFRNONAA 55 (L) 09/03/2018                             Hepatic Function Markers Lab Results  Component Value Date   AST 15 09/03/2018   ALT 14 09/03/2018   ALBUMIN 2.4 (L) 09/03/2018   ALKPHOS 112 09/03/2018   AMYLASE 64 03/29/2007   LIPASE 68 (H) 08/27/2018                        Electrolytes Lab Results  Component Value Date   NA 124 (L) 09/03/2018   K 3.7 09/03/2018   CL 93 (L) 09/03/2018   CALCIUM 8.2 (L) 09/03/2018   MG 1.9 07/17/2018   PHOS 2.5 11/11/2017                        Neuropathy Markers Lab Results  Component Value Date   VITAMINB12 211 04/27/2018   FOLATE 3.9 (L) 04/27/2018   HGBA1C 14.5 (H) 08/16/2018   HIV Non Reactive 07/17/2018                        CNS Tests No results found.  Bone Pathology Markers Lab Results  Component Value Date   25OHVITD1 3.6 (L) 08/14/2018   25OHVITD2 <1.0 08/14/2018   25OHVITD3 3.4 08/14/2018                         Coagulation Parameters Lab Results  Component Value Date   INR 0.95 04/21/2018   LABPROT 12.6 04/21/2018   APTT 35 12/18/2017   PLT 401 (H) 09/03/2018                        Cardiovascular Markers Lab Results  Component Value Date   BNP 87.0 07/20/2017   CKTOTAL 128 07/17/2018   TROPONINI <0.03 09/03/2018    HGB 8.6 (L) 09/03/2018   HCT 25.9 (L) 09/03/2018                         CA Markers No results found.  Endocrine Markers Lab Results  Component Value Date   TSH 1.502 12/29/2017                        Note: Lab results reviewed.  Recent Diagnostic Imaging Review  Knee Imaging: Knee-R DG 4 views:  Results for orders placed during the hospital encounter of 09/03/18  DG Knee Complete 4 Views Right   Narrative CLINICAL DATA:  Falls yesterday and today. Right knee pain. Initial encounter.  EXAM: RIGHT KNEE - COMPLETE 4+ VIEW  COMPARISON:  None.  FINDINGS: No evidence of fracture or dislocation. Small knee joint effusion is noted. No evidence of arthropathy or other focal bone abnormality. Generalized osteopenia noted. Soft tissues are unremarkable.  IMPRESSION: 1. Small knee joint effusion. No evidence of fracture or dislocation. 2. Osteopenia.  Electronically Signed   By: Earle Gell M.D.   On: 09/03/2018 13:38    Knee-L DG 4 views:  Results for orders placed during the hospital encounter of 09/03/18  DG Knee Complete 4 Views Left   Narrative CLINICAL DATA:  Falls yesterday and today. Left knee pain. Initial encounter.  EXAM: LEFT KNEE - COMPLETE 4+ VIEW  COMPARISON:  None.  FINDINGS: No evidence of fracture, dislocation, or joint effusion. No evidence of arthropathy or other focal bone abnormality. Generalized osteopenia noted. Soft tissues are unremarkable.  IMPRESSION: No acute findings.  Osteopenia.   Electronically Signed   By: Earle Gell M.D.   On: 09/03/2018 13:37    Ankle Imaging: Ankle-L DG Complete:  Results for orders placed during the hospital encounter of 01/18/18  DG Ankle Complete Left   Narrative CLINICAL DATA:  Ankle pain/swelling  EXAM: LEFT ANKLE COMPLETE - 3+ VIEW  COMPARISON:  None.  FINDINGS: No fracture or dislocation is seen.  The ankle mortise is intact.  The base of the fifth metatarsal is  unremarkable.  The visualized soft tissues are unremarkable.  Vascular calcifications.  IMPRESSION: Negative.   Electronically Signed   By: Julian Hy M.D.   On: 01/18/2018 17:35    Foot Imaging: Foot-R DG Complete:  Results for orders placed during the hospital encounter of 07/03/18  DG Foot Complete Right   Narrative CLINICAL DATA:  Foot pain. Prior amputations due to infection.  EXAM: RIGHT FOOT COMPLETE - 3+ VIEW  COMPARISON:  06/15/2018  FINDINGS: There is a new acute transverse fracture of the distal shaft of the third metatarsal.  No evidence of osteomyelitis. Previous resection of most of the fourth and fifth metatarsals and of the fourth and fifth toes.  IMPRESSION: Acute transverse fracture of the distal shaft of the third metatarsal. No visible osteomyelitis.   Electronically Signed   By: Lorriane Shire M.D.   On: 07/03/2018 17:25    Foot-L DG Complete:  Results for orders placed during the hospital encounter of 07/03/18  DG Foot Complete Left   Narrative CLINICAL DATA:  Bilateral foot pain  EXAM: LEFT FOOT - COMPLETE 3+ VIEW  COMPARISON:  06/15/2018  FINDINGS: Prior transmetatarsal amputation of the 4th and 5th toes. Bone destruction noted within the remaining 5th metatarsal since prior study compatible with osteomyelitis. There is healing 3rd metatarsal fracture with exuberant callus formation.  IMPRESSION: Bone destruction within the base of the left 5th metatarsal stump compatible with osteomyelitis.  Healing 5th metatarsal fracture with callus formation.   Electronically Signed   By: Rolm Baptise M.D.   On: 07/03/2018 17:58    Complexity Note: Imaging results reviewed. Results shared with Shawn [name], using Layman's terms.                         Meds   Current Outpatient Medications:  .  ALPRAZolam (XANAX) 0.25 MG tablet, Take 1 tablet (0.25 mg total) by mouth 3 (three) times daily as needed for anxiety., Disp: 12  tablet, Rfl: 0 .  ferrous sulfate 325 (65 FE) MG tablet, Take 1 tablet (325 mg total) by mouth 2 (two) times daily with a meal., Disp: 60 tablet, Rfl: 0 .  insulin aspart (NOVOLOG) 100 UNIT/ML injection, Inject 4 Units into the skin 3 (three) times daily with meals., Disp: 10 mL, Rfl: 11 .  insulin glargine (LANTUS) 100 UNIT/ML injection, Inject 0.3 mLs (30 Units total) into the skin daily. (Patient taking  differently: Inject 30 Units into the skin 2 (two) times daily. ), Disp: 10 mL, Rfl: 11 .  Insulin Syringes, Disposable, U-100 0.3 ML MISC, 1 Syringe by Does not apply route 4 (four) times daily -  with meals and at bedtime., Disp: 100 each, Rfl: 0 .  lisinopril (PRINIVIL,ZESTRIL) 10 MG tablet, Take 1 tablet (10 mg total) by mouth daily., Disp: 30 tablet, Rfl: 2 .  ondansetron (ZOFRAN ODT) 4 MG disintegrating tablet, Take 1 tablet (4 mg total) by mouth every 8 (eight) hours as needed for nausea or vomiting., Disp: 20 tablet, Rfl: 0 .  potassium chloride (K-DUR) 10 MEQ tablet, Take 10 mEq by mouth daily., Disp: , Rfl:  .  promethazine (PHENERGAN) 25 MG tablet, Take 1 tablet (25 mg total) by mouth every 6 (six) hours as needed for nausea or vomiting., Disp: 30 tablet, Rfl: 0 .  vitamin C (VITAMIN C) 250 MG tablet, Take 1 tablet (250 mg total) by mouth 2 (two) times daily., Disp: 60 tablet, Rfl: 0 .  zolpidem (AMBIEN) 10 MG tablet, Take 10 mg by mouth at bedtime. , Disp: , Rfl:  .  calcium carbonate (CALCIUM 600) 600 MG TABS tablet, Take 1 tablet (600 mg total) by mouth 2 (two) times daily with a meal for 30 days., Disp: 60 tablet, Rfl: 0 .  Cholecalciferol (VITAMIN D3) 125 MCG (5000 UT) CAPS, Take 1 capsule (5,000 Units total) by mouth daily with breakfast. Take along with calcium and magnesium., Disp: 30 capsule, Rfl: 5 .  Cyanocobalamin (VITAMIN B-12) 5000 MCG SUBL, Place 1 tablet (5,000 mcg total) under the tongue daily., Disp: 30 tablet, Rfl: 2 .  [START ON 09/14/2018] ergocalciferol (VITAMIN D2)  1.25 MG (50000 UT) capsule, Take 1 capsule (50,000 Units total) by mouth 2 (two) times a week. X 6 weeks., Disp: 12 capsule, Rfl: 0 .  gabapentin (NEURONTIN) 100 MG capsule, Take 1-3 capsules (100-300 mg total) by mouth 4 (four) times daily. Follow the written titration schedule., Disp: 360 capsule, Rfl: 2 .  Magnesium 500 MG CAPS, Take 1 capsule (500 mg total) by mouth 2 (two) times daily at 8 am and 10 pm., Disp: 60 capsule, Rfl: 5  ROS  Constitutional: Denies any fever or chills Gastrointestinal: No reported hemesis, hematochezia, vomiting, or acute GI distress Musculoskeletal: Denies any acute onset joint swelling, redness, loss of ROM, or weakness Neurological: No reported episodes of acute onset apraxia, aphasia, dysarthria, agnosia, amnesia, paralysis, loss of coordination, or loss of consciousness  Allergies  Shawn [name] is allergic to banana; keflex [cephalexin]; onion; sulfa antibiotics; grapeseed extract [nutritional supplements]; shellfish allergy; and grape seed.  [place]  Drug: Shawn [name]  reports current drug use. Drugs: Marijuana and PCP. Alcohol:  reports no history of alcohol use. Tobacco:  reports that he has never smoked. He has never used smokeless tobacco. Medical:  has a past medical history of CKD (chronic kidney disease), Diabetes mellitus without complication ([place]), GERD (gastroesophageal reflux disease), HTN (hypertension), IBS (irritable bowel syndrome), and Osteomyelitis ([place]). Surgical: Shawn [name]  has a past surgical history that includes Irrigation and debridement foot (Right, 12/20/2017); Irrigation and debridement foot (Right, 12/24/2017); Amputation (Right, 12/24/2017); Application if wound vac (Right, 12/24/2017); ABDOMINAL AORTOGRAM W/LOWER EXTREMITY (Right, 12/23/2017); Irrigation and debridement foot (Right, 01/01/2018); Application if wound vac (Right, 01/01/2018); Irrigation and debridement foot (Left, 04/06/2018); Amputation (Left, 04/06/2018); Lower Extremity  Angiography (Left, 04/06/2018); Amputation (Left, 04/09/2018); Achilles tendon surgery (Bilateral, 06/16/2018); and Bone excision (Bilateral, 06/16/2018). Family: family history includes  Diabetes in his mother; Healthy in his father; Ovarian cancer in his mother.  Constitutional Exam  General appearance: Well nourished, well developed, and well hydrated. In no apparent acute distress Vitals:   09/13/18 0813  BP: (!) 143/106  Pulse: (!) 102  Temp: 98.2 F (36.8 C)  SpO2: 100%  Weight: 120 lb (54.4 kg)  Height: '5\' 6"'$  (1.676 m)   BMI Assessment: Estimated body mass index is 19.37 kg/m as calculated from the following:   Height as of this encounter: '5\' 6"'$  (1.676 m).   Weight as of this encounter: 120 lb (54.4 kg).  BMI interpretation table: BMI level Category Range association with higher incidence of chronic pain  <18 kg/m2 Underweight   18.5-24.9 kg/m2 Ideal body weight   25-29.9 kg/m2 Overweight Increased incidence by 20%  30-34.9 kg/m2 Obese (Class I) Increased incidence by 68%  35-39.9 kg/m2 Severe obesity (Class II) Increased incidence by 136%  >40 kg/m2 Extreme obesity (Class III) Increased incidence by 254%   Patient's current BMI Ideal Body weight  Body mass index is 19.37 kg/m. Ideal body weight: 63.8 kg (140 lb 10.5 oz)   BMI Readings from Last 4 Encounters:  09/13/18 19.37 kg/m  09/03/18 19.37 kg/m  08/27/18 19.37 kg/m  08/18/18 17.68 kg/m   Wt Readings from Last 4 Encounters:  09/13/18 120 lb (54.4 kg)  09/03/18 120 lb (54.4 kg)  08/27/18 120 lb (54.4 kg)  08/18/18 109 lb 9.1 oz (49.7 kg)  Psych/Mental status: Alert, oriented x 3 (person, place, & time)       Eyes: PERLA Respiratory: No evidence of acute respiratory distress  Cervical Spine Area Exam  Skin & Axial Inspection: No masses, redness, edema, swelling, or associated skin lesions Alignment: Symmetrical Functional ROM: Unrestricted ROM      Stability: No instability detected Muscle  Tone/Strength: Functionally intact. No obvious neuro-muscular anomalies detected. Sensory (Neurological): Unimpaired Palpation: No palpable anomalies              Upper Extremity (UE) Exam    Side: Right upper extremity  Side: Left upper extremity  Skin & Extremity Inspection: Skin color, temperature, and hair growth are WNL. No peripheral edema or cyanosis. No masses, redness, swelling, asymmetry, or associated skin lesions. No contractures.  Skin & Extremity Inspection: Skin color, temperature, and hair growth are WNL. No peripheral edema or cyanosis. No masses, redness, swelling, asymmetry, or associated skin lesions. No contractures.  Functional ROM: Unrestricted ROM          Functional ROM: Unrestricted ROM          Muscle Tone/Strength: Functionally intact. No obvious neuro-muscular anomalies detected.  Muscle Tone/Strength: Functionally intact. No obvious neuro-muscular anomalies detected.  Sensory (Neurological): Unimpaired          Sensory (Neurological): Unimpaired          Palpation: No palpable anomalies              Palpation: No palpable anomalies              Provocative Test(s):  Phalen's test: deferred Tinel's test: deferred Apley's scratch test (touch opposite shoulder):  Action 1 (Across chest): deferred Action 2 (Overhead): deferred Action 3 (LB reach): deferred   Provocative Test(s):  Phalen's test: deferred Tinel's test: deferred Apley's scratch test (touch opposite shoulder):  Action 1 (Across chest): deferred Action 2 (Overhead): deferred Action 3 (LB reach): deferred    Thoracic Spine Area Exam  Skin & Axial Inspection: No masses, redness, or swelling  Alignment: Symmetrical Functional ROM: Unrestricted ROM Stability: No instability detected Muscle Tone/Strength: Functionally intact. No obvious neuro-muscular anomalies detected. Sensory (Neurological): Unimpaired Muscle strength & Tone: No palpable anomalies  Lumbar Spine Area Exam  Skin & Axial  Inspection: No masses, redness, or swelling Alignment: Symmetrical Functional ROM: Unrestricted ROM       Stability: No instability detected Muscle Tone/Strength: Functionally intact. No obvious neuro-muscular anomalies detected. Sensory (Neurological): Unimpaired Palpation: No palpable anomalies       Provocative Tests: Hyperextension/rotation test: deferred today       Lumbar quadrant test (Kemp's test): deferred today       Lateral bending test: deferred today       Patrick's Maneuver: deferred today                   FABER* test: deferred today                   S-I anterior distraction/compression test: deferred today         S-I lateral compression test: deferred today         S-I Thigh-thrust test: deferred today         S-I Gaenslen's test: deferred today         *(Flexion, ABduction and External Rotation)  Gait & Posture Assessment  Ambulation: Patient ambulates using a walker Gait: Significantly limited. Dependent on assistive device to ambulate Posture: Antalgic   Lower Extremity Exam    Side: Right lower extremity  Side: Left lower extremity  Stability: No instability observed          Stability: No instability observed          Skin & Extremity Inspection: Foot amputation  Skin & Extremity Inspection: Foot amputation  Functional ROM: Unrestricted ROM                  Functional ROM: Unrestricted ROM                  Muscle Tone/Strength: Moderate-to-severe deconditioning  Muscle Tone/Strength: Moderate-to-severe deconditioning  Sensory (Neurological): Neurogenic pain pattern        Sensory (Neurological): Neurogenic pain pattern        DTR: Patellar: deferred today Achilles: deferred today Plantar: deferred today  DTR: Patellar: deferred today Achilles: deferred today Plantar: deferred today  Palpation: No palpable anomalies  Palpation: No palpable anomalies   Assessment & Plan  Primary Diagnosis & Pertinent Problem List: The primary encounter diagnosis was  Chronic foot pain, unspecified laterality. 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  Visit Diagnosis: 1. Chronic foot pain, unspecified laterality   2. Chronic lower extremity pain (Secondary Area of Pain) (Bilateral) (L>R)   3. Chronic generalized abdominal pain (Tertiary Area of Pain)   4. Vitamin D deficiency   5. Neurogenic pain   6. 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)   8. CKD stage G3a/A1, GFR 45-59 and albumin creatinine ratio <30 mg/g ([facility])   9. Long term prescription benzodiazepine use   10. Marijuana use    Problems updated and reviewed during  this visit: Problem  Neurogenic Pain  Marijuana Use  Long Term Prescription Benzodiazepine Use  Insulin-Dependent Diabetes Mellitus With Renal Complications ([facility])    Plan of Care  Pharmacotherapy (Medications Ordered): Meds ordered this encounter  Medications  . ergocalciferol (VITAMIN D2) 1.25 MG (50000 UT) capsule    Sig: Take 1 capsule (50,000 Units total) by mouth 2 (two) times a week. X 6 weeks.    Dispense:  12 capsule    Refill:  0    Do not add this medication to the electronic "Automatic Refill" notification system. Patient may have prescription filled one day early if pharmacy is closed on scheduled refill date.  . Cholecalciferol (VITAMIN D3) 125 MCG (5000 UT) CAPS    Sig: Take 1 capsule (5,000 Units total) by mouth daily with breakfast. Take along with calcium and magnesium.    Dispense:  30  capsule    Refill:  5    Do not place medication on "Automatic Refill".  May substitute with similar over-the-counter product.  . Magnesium 500 MG CAPS    Sig: Take 1 capsule (500 mg total) by mouth 2 (two) times daily at 8 am and 10 pm.    Dispense:  60 capsule    Refill:  5    Do not place medication on "Automatic Refill".  The patient may use similar over-the-counter product.  . calcium carbonate (CALCIUM 600) 600 MG TABS tablet    Sig: Take 1 tablet (600 mg total) by mouth 2 (two) times daily with a meal for 30 days.    Dispense:  60 tablet    Refill:  0    Do not place medication on "Automatic Refill". Fill one day early if pharmacy is closed on scheduled refill date.  . gabapentin (NEURONTIN) 100 MG capsule    Sig: Take 1-3 capsules (100-300 mg total) by mouth 4 (four) times daily. Follow the written titration schedule.    Dispense:  360 capsule    Refill:  2    Do not place this medication, or any other prescription from our practice, on "Automatic Refill". Patient may have prescription filled one day early if pharmacy is closed on scheduled refill date.  . Cyanocobalamin (VITAMIN B-12) 5000 MCG SUBL    Sig: Place 1 tablet (5,000 mcg total) under the tongue daily.    Dispense:  30 tablet    Refill:  2    Do not place medication on "Automatic Refill". Fill one day early if pharmacy is closed on scheduled refill date.   Procedure Orders    No procedure(s) ordered today   Lab Orders  No laboratory test(s) ordered today   Imaging Orders  No imaging studies ordered today   Referral Orders  No referral(s) requested today    Pharmacological management options:  Opioid Analgesics: I will not be prescribing any opioids at this time Membrane stabilizer: Currently on an appropriate regimen.  The patient was given a prescription to continue titrating the medication up.  He was also given written instructions on how to do so, including how fast and went to stop increasing it. Muscle  relaxant: None prescribed at this time NSAID: None prescribed at this time Other analgesic(s): The patient was given a prescription for vitamin D and vitamin B12.   Interventional management options: Planned, scheduled, and/or pending:    None at this time.   Considering:   Possible spinal cord stimulator trial and implant, should he improve significantly his medical condition.   PRN Procedures:  None at this time   Provider-requested follow-up: No follow-ups on file.  No return appointment.  No future appointments.  Primary Care Physician: Valera Castle, MD Location: [facility] Note by: Gaspar Cola, MD Date: 09/13/2018; Time: 12:18 PM

## 2018-09-13 ENCOUNTER — Encounter: Payer: Self-pay | Admitting: Pain Medicine

## 2018-09-13 ENCOUNTER — Other Ambulatory Visit: Payer: Self-pay

## 2018-09-13 ENCOUNTER — Ambulatory Visit: Payer: Medicaid Other | Attending: Pain Medicine | Admitting: Pain Medicine

## 2018-09-13 VITALS — BP 143/106 | HR 102 | Temp 98.2°F | Ht 66.0 in | Wt 120.0 lb

## 2018-09-13 DIAGNOSIS — IMO0001 Reserved for inherently not codable concepts without codable children: Secondary | ICD-10-CM

## 2018-09-13 DIAGNOSIS — G8929 Other chronic pain: Secondary | ICD-10-CM

## 2018-09-13 DIAGNOSIS — E1129 Type 2 diabetes mellitus with other diabetic kidney complication: Secondary | ICD-10-CM | POA: Insufficient documentation

## 2018-09-13 DIAGNOSIS — E559 Vitamin D deficiency, unspecified: Secondary | ICD-10-CM | POA: Diagnosis not present

## 2018-09-13 DIAGNOSIS — M79605 Pain in left leg: Secondary | ICD-10-CM | POA: Diagnosis present

## 2018-09-13 DIAGNOSIS — M792 Neuralgia and neuritis, unspecified: Secondary | ICD-10-CM | POA: Diagnosis present

## 2018-09-13 DIAGNOSIS — M79604 Pain in right leg: Secondary | ICD-10-CM | POA: Diagnosis present

## 2018-09-13 DIAGNOSIS — N1831 Chronic kidney disease, stage 3a: Secondary | ICD-10-CM

## 2018-09-13 DIAGNOSIS — E1042 Type 1 diabetes mellitus with diabetic polyneuropathy: Secondary | ICD-10-CM | POA: Insufficient documentation

## 2018-09-13 DIAGNOSIS — F1290 Cannabis use, unspecified, uncomplicated: Secondary | ICD-10-CM | POA: Diagnosis present

## 2018-09-13 DIAGNOSIS — R1084 Generalized abdominal pain: Secondary | ICD-10-CM | POA: Diagnosis not present

## 2018-09-13 DIAGNOSIS — Z794 Long term (current) use of insulin: Secondary | ICD-10-CM | POA: Insufficient documentation

## 2018-09-13 DIAGNOSIS — N183 Chronic kidney disease, stage 3 (moderate): Secondary | ICD-10-CM | POA: Insufficient documentation

## 2018-09-13 DIAGNOSIS — Z79899 Other long term (current) drug therapy: Secondary | ICD-10-CM | POA: Diagnosis present

## 2018-09-13 DIAGNOSIS — M79673 Pain in unspecified foot: Secondary | ICD-10-CM | POA: Diagnosis not present

## 2018-09-13 MED ORDER — CALCIUM CARBONATE 600 MG PO TABS: 600.0000 mg | ORAL_TABLET | Freq: Two times a day (BID) | ORAL | 0 refills | Status: DC

## 2018-09-13 MED ORDER — ERGOCALCIFEROL 1.25 MG (50000 UT) PO CAPS: 50000.0000 [IU] | ORAL_CAPSULE | ORAL | 0 refills | Status: AC

## 2018-09-13 MED ORDER — MAGNESIUM 500 MG PO CAPS: 500.0000 mg | ORAL_CAPSULE | Freq: Two times a day (BID) | ORAL | 5 refills | Status: DC

## 2018-09-13 MED ORDER — GABAPENTIN 100 MG PO CAPS: 100.0000 mg | ORAL_CAPSULE | Freq: Four times a day (QID) | ORAL | 2 refills | Status: DC

## 2018-09-13 MED ORDER — VITAMIN D3 125 MCG (5000 UT) PO CAPS: 1.0000 | ORAL_CAPSULE | Freq: Every day | ORAL | 5 refills | Status: AC

## 2018-09-13 MED ORDER — VITAMIN B-12 5000 MCG SL SUBL: 5000.0000 ug | SUBLINGUAL_TABLET | Freq: Every day | SUBLINGUAL | 2 refills | Status: AC

## 2018-09-13 NOTE — Patient Instructions (Addendum)
____________________________________________________________________________________________  Gabapentin Titration  Medication used: Gabapentin (Generic Name) or Neurontin (Brand Name) 100 mg tablets/capsules  Reasons to stop increasing the dose:  Reason 1: You get good relief of symptoms, in which case there is no need to increase the daily dose any further.    Reason 2: You develop some side effects, such as sleeping all of the time, difficulty concentrating, or becoming disoriented, in which case you need to go down on the dose, to the prior level, where you were not experiencing any side effects. Stay on that dose longer, to allow more time for your body to get use it, before attempting to increase it again.   Steps to increase medication: Step 1: Start by taking 1 (one) tablet at bedtime x 7 (seven) days.  Step 2: Increase dose to 2 (two) tablets at bedtime. Stay on this dose x 7 (seven) days.  Step 3: Next increase it to 3 (three) tablets at bedtime. Stay on this dose x another 7 (seven) days.  Step 4: Next, add 1 (one) tablet at noon with lunch. Continue this dose x another 7 (seven) days.  Step 5: Add 1 (one) tablet in the afternoon with dinner. Stay on this dose x another 7 (seven) days.  Step 6: At this point you should be taking the medicine 4 (four) times a day. This daily regimen of taking the medicine 4 (four) times a day, will be maintained from now on. You should not take any doses any sooner than every 6 (six) hours.  Step 7: After 7 (seven) days of taking 3 (three) tablet at bedtime, 1 (one) tablet at noon, 1 (one) tablet in the afternoon, and 1 (one) tablet in the morning, begin taking 2 (two) tablets at noon with lunch. Stay on this dose x another 7 (seven) days.   Step 8: After 7 (seven) days of taking 3 (three) tablet at bedtime, 2 (two) tablets at noon, 1 (one) tablet in the afternoon, and 1 (one) tablet in the morning, begin taking 2 (two) tablets in the afternoon  with dinner. Stay on this dose x another 7 (seven) days.   Step 9: After 7 (seven) days of taking 3 (three) tablet at bedtime, 2 (two) tablets at noon, 2 (two) tablets in the afternoon, and 1 (one) tablet in the morning, begin taking 2 (two) tablets in the morning with breakfast. Stay on this dose x another 7 (seven) days. At this point you should be taking the medicine 4 (four) times a day, or about every 6 (six) hours. This daily regimen of taking the medicine 4 (four) times a day, will be maintained from now on. You should not take any doses any sooner than every 6 (six) hours.  Step 10: After 7 (seven) days of taking 3 (three) tablet at bedtime, 2 (two) tablets at noon, 2 (two) tablets in the afternoon, and 2 (two) tablets in the morning, begin taking 3 (three) tablets at noon with lunch. Stay on this dose x another 7 (seven) days.   Step 11: After 7 (seven) days of taking 3 (three) tablet at bedtime, 3 (three) tablets at noon, 2 (two) tablets in the afternoon, and 2 (two) tablets in the morning, begin taking 3 (three) tablets in the afternoon with dinner. Stay on this dose x another 7 (seven) days.   Step 12: After 7 (seven) days of taking 3 (three) tablet at bedtime, 3 (three) tablets at noon, 3 (three) tablets in the afternoon, and 2 (two)  tablet in the morning, begin taking 3 (three) tablets in the morning with breakfast. Stay on this dose x another 7 (seven) days. At this point you should be taking the medicine 4 (four) times a day, or about every 6 (six) hours. This daily regimen of taking the medicine 4 (four) times a day, will be maintained from now on.   Endpoint: Once you have reached the maximum dose you can tolerate without side-effects, contact your physician so as to evaluate the results of the regimen.   Questions: Feel free to contact us for any questions or problems at [phone]  [phone] ____________________________________________________________________________________________   Dietary considerations for treatment of inflammation: Turmeric

## 2018-09-19 ENCOUNTER — Encounter: Payer: Self-pay | Admitting: *Deleted

## 2018-09-19 ENCOUNTER — Other Ambulatory Visit: Payer: Self-pay

## 2018-09-19 ENCOUNTER — Inpatient Hospital Stay
Admission: EM | Admit: 2018-09-19 | Discharge: 2018-09-21 | DRG: 623 | Disposition: A | Discharge status: Home Health | Payer: Medicaid Other | Attending: Internal Medicine | Admitting: Internal Medicine

## 2018-09-19 ENCOUNTER — Emergency Department: Payer: Medicaid Other

## 2018-09-19 DIAGNOSIS — Z9102 Food additives allergy status: Secondary | ICD-10-CM | POA: Diagnosis not present

## 2018-09-19 DIAGNOSIS — F329 Major depressive disorder, single episode, unspecified: Secondary | ICD-10-CM | POA: Diagnosis present

## 2018-09-19 DIAGNOSIS — Z91018 Allergy to other foods: Secondary | ICD-10-CM | POA: Diagnosis not present

## 2018-09-19 DIAGNOSIS — D631 Anemia in chronic kidney disease: Secondary | ICD-10-CM | POA: Diagnosis present

## 2018-09-19 DIAGNOSIS — L02611 Cutaneous abscess of right foot: Secondary | ICD-10-CM | POA: Diagnosis present

## 2018-09-19 DIAGNOSIS — Z79891 Long term (current) use of opiate analgesic: Secondary | ICD-10-CM | POA: Diagnosis not present

## 2018-09-19 DIAGNOSIS — E876 Hypokalemia: Secondary | ICD-10-CM | POA: Diagnosis present

## 2018-09-19 DIAGNOSIS — Z833 Family history of diabetes mellitus: Secondary | ICD-10-CM

## 2018-09-19 DIAGNOSIS — Z794 Long term (current) use of insulin: Secondary | ICD-10-CM

## 2018-09-19 DIAGNOSIS — L03115 Cellulitis of right lower limb: Secondary | ICD-10-CM | POA: Diagnosis present

## 2018-09-19 DIAGNOSIS — D509 Iron deficiency anemia, unspecified: Secondary | ICD-10-CM | POA: Diagnosis present

## 2018-09-19 DIAGNOSIS — R112 Nausea with vomiting, unspecified: Secondary | ICD-10-CM

## 2018-09-19 DIAGNOSIS — Z89421 Acquired absence of other right toe(s): Secondary | ICD-10-CM

## 2018-09-19 DIAGNOSIS — E11621 Type 2 diabetes mellitus with foot ulcer: Secondary | ICD-10-CM | POA: Diagnosis present

## 2018-09-19 DIAGNOSIS — Z89422 Acquired absence of other left toe(s): Secondary | ICD-10-CM

## 2018-09-19 DIAGNOSIS — E1165 Type 2 diabetes mellitus with hyperglycemia: Secondary | ICD-10-CM | POA: Diagnosis present

## 2018-09-19 DIAGNOSIS — Z882 Allergy status to sulfonamides status: Secondary | ICD-10-CM | POA: Diagnosis not present

## 2018-09-19 DIAGNOSIS — IMO0002 Reserved for concepts with insufficient information to code with codable children: Secondary | ICD-10-CM | POA: Diagnosis present

## 2018-09-19 DIAGNOSIS — I129 Hypertensive chronic kidney disease with stage 1 through stage 4 chronic kidney disease, or unspecified chronic kidney disease: Secondary | ICD-10-CM | POA: Diagnosis present

## 2018-09-19 DIAGNOSIS — Z79899 Other long term (current) drug therapy: Secondary | ICD-10-CM

## 2018-09-19 DIAGNOSIS — K580 Irritable bowel syndrome with diarrhea: Secondary | ICD-10-CM | POA: Diagnosis present

## 2018-09-19 DIAGNOSIS — Z8041 Family history of malignant neoplasm of ovary: Secondary | ICD-10-CM

## 2018-09-19 DIAGNOSIS — R197 Diarrhea, unspecified: Secondary | ICD-10-CM

## 2018-09-19 DIAGNOSIS — Z881 Allergy status to other antibiotic agents status: Secondary | ICD-10-CM

## 2018-09-19 DIAGNOSIS — E1151 Type 2 diabetes mellitus with diabetic peripheral angiopathy without gangrene: Secondary | ICD-10-CM | POA: Diagnosis present

## 2018-09-19 DIAGNOSIS — E1122 Type 2 diabetes mellitus with diabetic chronic kidney disease: Secondary | ICD-10-CM | POA: Diagnosis present

## 2018-09-19 DIAGNOSIS — N183 Chronic kidney disease, stage 3 (moderate): Secondary | ICD-10-CM | POA: Diagnosis present

## 2018-09-19 DIAGNOSIS — L97419 Non-pressure chronic ulcer of right heel and midfoot with unspecified severity: Secondary | ICD-10-CM | POA: Diagnosis present

## 2018-09-19 DIAGNOSIS — R739 Hyperglycemia, unspecified: Secondary | ICD-10-CM | POA: Diagnosis present

## 2018-09-19 DIAGNOSIS — K219 Gastro-esophageal reflux disease without esophagitis: Secondary | ICD-10-CM | POA: Diagnosis present

## 2018-09-19 DIAGNOSIS — L8990 Pressure ulcer of unspecified site, unspecified stage: Secondary | ICD-10-CM

## 2018-09-19 LAB — GLUCOSE, CAPILLARY
Glucose-Capillary: >600 mg/dL (ref 70–99)
Glucose-Capillary: 536 mg/dL (ref 70–99)
Glucose-Capillary: 376 mg/dL — ABNORMAL HIGH (ref 70–99)

## 2018-09-19 LAB — COMPREHENSIVE METABOLIC PANEL
ALT: 9 U/L (ref 0–44)
AST: 13 U/L — ABNORMAL LOW (ref 15–41)
Albumin: 2.2 g/dL — ABNORMAL LOW (ref 3.5–5.0)
Alkaline Phosphatase: 118 U/L (ref 38–126)
Anion gap: 12 (ref 5–15)
BUN: 34 mg/dL — ABNORMAL HIGH (ref 6–20)
CO2: 22 mmol/L (ref 22–32)
Calcium: 8.1 mg/dL — ABNORMAL LOW (ref 8.9–10.3)
Chloride: 90 mmol/L — ABNORMAL LOW (ref 98–111)
Creatinine, Ser: 1.94 mg/dL — ABNORMAL HIGH (ref 0.61–1.24)
GFR calc Af Amer: 52 mL/min — ABNORMAL LOW (ref >60)
GFR calc non Af Amer: 45 mL/min — ABNORMAL LOW (ref >60)
Glucose, Bld: 720 mg/dL (ref 70–99)
Potassium: 3.6 mmol/L (ref 3.5–5.1)
Sodium: 124 mmol/L — ABNORMAL LOW (ref 135–145)
Total Bilirubin: 0.5 mg/dL (ref 0.3–1.2)
Total Protein: 6.2 g/dL — ABNORMAL LOW (ref 6.5–8.1)

## 2018-09-19 LAB — CBC
HCT: 25.6 % — ABNORMAL LOW (ref 39.0–52.0)
Hemoglobin: 9 g/dL — ABNORMAL LOW (ref 13.0–17.0)
MCH: 27.4 pg (ref 26.0–34.0)
MCHC: 35.2 g/dL (ref 30.0–36.0)
MCV: 78 fL — ABNORMAL LOW (ref 80.0–100.0)
Platelets: 355 10*3/uL (ref 150–400)
RBC: 3.28 MIL/uL — ABNORMAL LOW (ref 4.22–5.81)
RDW: 12.5 % (ref 11.5–15.5)
WBC: 13.5 10*3/uL — ABNORMAL HIGH (ref 4.0–10.5)
nRBC: 0 % (ref 0.0–0.2)

## 2018-09-19 LAB — URINALYSIS, COMPLETE (UACMP) WITH MICROSCOPIC
Bacteria, UA: NONE SEEN
Bilirubin Urine: NEGATIVE
Glucose, UA: >=500 mg/dL — AB
Ketones, ur: NEGATIVE mg/dL
Leukocytes,Ua: NEGATIVE
Nitrite: NEGATIVE
Protein, ur: 100 mg/dL — AB
Specific Gravity, Urine: 1.02 (ref 1.005–1.030)
pH: 5 (ref 5.0–8.0)

## 2018-09-19 LAB — LIPASE, BLOOD: Lipase: 72 U/L — ABNORMAL HIGH (ref 11–51)

## 2018-09-19 MED ORDER — SODIUM CHLORIDE 0.9 % IV BOLUS
1000.0000 mL | Freq: Once | INTRAVENOUS | Status: AC
  Administered 2018-09-19: 1000 mL via INTRAVENOUS

## 2018-09-19 MED ORDER — CLINDAMYCIN PHOSPHATE 600 MG/50ML IV SOLN
600.0000 mg | Freq: Once | INTRAVENOUS | Status: AC
  Administered 2018-09-20: 600 mg via INTRAVENOUS
  Filled 2018-09-19 (×2): qty 50

## 2018-09-19 MED ORDER — OXYCODONE-ACETAMINOPHEN 5-325 MG PO TABS
1.0000 | ORAL_TABLET | Freq: Four times a day (QID) | ORAL | Status: DC | PRN
  Administered 2018-09-20 – 2018-09-21 (×5): 1 via ORAL
  Filled 2018-09-19 (×5): qty 1

## 2018-09-19 MED ORDER — MORPHINE SULFATE (PF) 4 MG/ML IV SOLN
4.0000 mg | Freq: Once | INTRAVENOUS | Status: AC
  Administered 2018-09-19: 4 mg via INTRAVENOUS
  Filled 2018-09-19: qty 1

## 2018-09-19 MED ORDER — PROMETHAZINE HCL 25 MG/ML IJ SOLN
12.5000 mg | Freq: Four times a day (QID) | INTRAMUSCULAR | Status: DC | PRN
  Administered 2018-09-19 – 2018-09-21 (×5): 12.5 mg via INTRAVENOUS
  Filled 2018-09-19 (×5): qty 1

## 2018-09-19 MED ORDER — HEPARIN SODIUM (PORCINE) 5000 UNIT/ML IJ SOLN
5000.0000 [IU] | Freq: Three times a day (TID) | INTRAMUSCULAR | Status: DC
  Administered 2018-09-19 – 2018-09-21 (×4): 5000 [IU] via SUBCUTANEOUS
  Filled 2018-09-19 (×4): qty 1

## 2018-09-19 MED ORDER — ZOLPIDEM TARTRATE 5 MG PO TABS
10.0000 mg | ORAL_TABLET | Freq: Every day | ORAL | Status: DC
  Administered 2018-09-19 – 2018-09-20 (×2): 10 mg via ORAL
  Filled 2018-09-19 (×2): qty 2

## 2018-09-19 MED ORDER — INSULIN ASPART 100 UNIT/ML ~~LOC~~ SOLN: 6.0000 [IU] | Freq: Three times a day (TID) | SUBCUTANEOUS | Status: DC

## 2018-09-19 MED ORDER — VITAMIN C 500 MG PO TABS
250.0000 mg | ORAL_TABLET | Freq: Two times a day (BID) | ORAL | Status: DC
  Administered 2018-09-19 – 2018-09-21 (×4): 250 mg via ORAL
  Filled 2018-09-19 (×4): qty 1

## 2018-09-19 MED ORDER — SENNOSIDES-DOCUSATE SODIUM 8.6-50 MG PO TABS: 1.0000 | ORAL_TABLET | Freq: Every evening | ORAL | Status: DC | PRN

## 2018-09-19 MED ORDER — OXYCODONE HCL 5 MG PO TABS
5.0000 mg | ORAL_TABLET | Freq: Four times a day (QID) | ORAL | Status: DC | PRN
  Administered 2018-09-21 (×2): 5 mg via ORAL
  Filled 2018-09-19 (×2): qty 1

## 2018-09-19 MED ORDER — CALCIUM CARBONATE ANTACID 500 MG PO CHEW
500.0000 mg | CHEWABLE_TABLET | Freq: Two times a day (BID) | ORAL | Status: DC
  Administered 2018-09-20 – 2018-09-21 (×2): 500 mg via ORAL
  Filled 2018-09-19 (×2): qty 3

## 2018-09-19 MED ORDER — INSULIN ASPART 100 UNIT/ML ~~LOC~~ SOLN
0.0000 [IU] | Freq: Every day | SUBCUTANEOUS | Status: DC
  Administered 2018-09-19: 5 [IU] via SUBCUTANEOUS
  Filled 2018-09-19: qty 1

## 2018-09-19 MED ORDER — VITAMIN B-12 1000 MCG PO TABS
5000.0000 ug | ORAL_TABLET | Freq: Every day | ORAL | Status: DC
  Administered 2018-09-20 – 2018-09-21 (×2): 5000 ug via ORAL
  Filled 2018-09-19 (×2): qty 5

## 2018-09-19 MED ORDER — LISINOPRIL 10 MG PO TABS
10.0000 mg | ORAL_TABLET | Freq: Every day | ORAL | Status: DC
  Administered 2018-09-20 – 2018-09-21 (×2): 10 mg via ORAL
  Filled 2018-09-19 (×2): qty 1

## 2018-09-19 MED ORDER — VITAMIN D3 25 MCG (1000 UNIT) PO TABS
125.0000 ug | ORAL_TABLET | Freq: Every day | ORAL | Status: DC
  Administered 2018-09-20 – 2018-09-21 (×2): 5000 [IU] via ORAL
  Filled 2018-09-19 (×4): qty 5

## 2018-09-19 MED ORDER — INSULIN ASPART 100 UNIT/ML ~~LOC~~ SOLN
0.0000 [IU] | Freq: Three times a day (TID) | SUBCUTANEOUS | Status: DC
  Administered 2018-09-20: 3 [IU] via SUBCUTANEOUS
  Filled 2018-09-19: qty 1

## 2018-09-19 MED ORDER — ACETAMINOPHEN 325 MG PO TABS: 650.0000 mg | ORAL_TABLET | Freq: Four times a day (QID) | ORAL | Status: DC | PRN

## 2018-09-19 MED ORDER — FERROUS SULFATE 325 (65 FE) MG PO TABS
325.0000 mg | ORAL_TABLET | Freq: Two times a day (BID) | ORAL | Status: DC
  Administered 2018-09-20 – 2018-09-21 (×2): 325 mg via ORAL
  Filled 2018-09-19 (×2): qty 1

## 2018-09-19 MED ORDER — INSULIN GLARGINE 100 UNIT/ML ~~LOC~~ SOLN
35.0000 [IU] | Freq: Two times a day (BID) | SUBCUTANEOUS | Status: DC
  Administered 2018-09-19: 35 [IU] via SUBCUTANEOUS
  Filled 2018-09-19 (×3): qty 0.35

## 2018-09-19 MED ORDER — ALPRAZOLAM 0.25 MG PO TABS
0.2500 mg | ORAL_TABLET | Freq: Three times a day (TID) | ORAL | Status: DC | PRN
  Administered 2018-09-21 (×2): 0.25 mg via ORAL
  Filled 2018-09-19 (×2): qty 1

## 2018-09-19 MED ORDER — ONDANSETRON HCL 4 MG/2ML IJ SOLN
4.0000 mg | Freq: Once | INTRAMUSCULAR | Status: AC
  Administered 2018-09-19: 4 mg via INTRAVENOUS
  Filled 2018-09-19: qty 2

## 2018-09-19 MED ORDER — ACETAMINOPHEN 650 MG RE SUPP: 650.0000 mg | Freq: Four times a day (QID) | RECTAL | Status: DC | PRN

## 2018-09-19 MED ORDER — MORPHINE SULFATE (PF) 2 MG/ML IV SOLN
2.0000 mg | INTRAVENOUS | Status: DC | PRN
  Administered 2018-09-19 – 2018-09-21 (×8): 2 mg via INTRAVENOUS
  Filled 2018-09-19 (×8): qty 1

## 2018-09-19 MED ORDER — POTASSIUM CHLORIDE CRYS ER 10 MEQ PO TBCR
10.0000 meq | EXTENDED_RELEASE_TABLET | Freq: Every day | ORAL | Status: DC
  Administered 2018-09-20: 10 meq via ORAL
  Filled 2018-09-19: qty 1

## 2018-09-19 MED ORDER — GABAPENTIN 300 MG PO CAPS
300.0000 mg | ORAL_CAPSULE | Freq: Two times a day (BID) | ORAL | Status: DC
  Administered 2018-09-19 – 2018-09-21 (×4): 300 mg via ORAL
  Filled 2018-09-19 (×4): qty 1

## 2018-09-19 MED ORDER — ONDANSETRON HCL 4 MG PO TABS: 4.0000 mg | ORAL_TABLET | Freq: Four times a day (QID) | ORAL | Status: DC | PRN

## 2018-09-19 MED ORDER — ONDANSETRON HCL 4 MG/2ML IJ SOLN: 4.0000 mg | Freq: Four times a day (QID) | INTRAMUSCULAR | Status: DC | PRN

## 2018-09-19 MED ORDER — INSULIN ASPART 100 UNIT/ML ~~LOC~~ SOLN
10.0000 [IU] | Freq: Once | SUBCUTANEOUS | Status: AC
  Administered 2018-09-19: 10 [IU] via SUBCUTANEOUS
  Filled 2018-09-19: qty 1

## 2018-09-19 MED ORDER — SODIUM CHLORIDE 0.9% FLUSH: 3.0000 mL | Freq: Once | INTRAVENOUS | Status: DC

## 2018-09-19 MED ORDER — OXYCODONE-ACETAMINOPHEN 10-325 MG PO TABS: 1.0000 | ORAL_TABLET | Freq: Four times a day (QID) | ORAL | Status: DC | PRN

## 2018-09-19 MED ORDER — MAGNESIUM OXIDE 400 (241.3 MG) MG PO TABS
400.0000 mg | ORAL_TABLET | Freq: Two times a day (BID) | ORAL | Status: DC
  Administered 2018-09-19 – 2018-09-21 (×3): 400 mg via ORAL
  Filled 2018-09-19 (×3): qty 1

## 2018-09-19 MED ORDER — DIPHENOXYLATE-ATROPINE 2.5-0.025 MG PO TABS: 1.0000 | ORAL_TABLET | Freq: Four times a day (QID) | ORAL | Status: DC | PRN

## 2018-09-19 MED ORDER — SODIUM CHLORIDE 0.9 % IV SOLN
INTRAVENOUS | Status: DC
  Administered 2018-09-19 – 2018-09-21 (×4): via INTRAVENOUS

## 2018-09-19 MED ORDER — VANCOMYCIN HCL 10 G IV SOLR
1250.0000 mg | Freq: Once | INTRAVENOUS | Status: AC
  Administered 2018-09-19: 1250 mg via INTRAVENOUS
  Filled 2018-09-19: qty 1250

## 2018-09-19 MED ORDER — HYDRALAZINE HCL 20 MG/ML IJ SOLN
10.0000 mg | INTRAMUSCULAR | Status: DC | PRN
  Administered 2018-09-19: 10 mg via INTRAVENOUS
  Filled 2018-09-19: qty 1

## 2018-09-19 NOTE — H&P (Signed)
[facility] [place] NAME: Shawn [name]    MR#:  [id number]  DATE OF BIRTH:  [birth year]  DATE OF ADMISSION:  09/19/2018  PRIMARY CARE PHYSICIAN: Valera Castle, MD   REQUESTING/REFERRING PHYSICIAN:   CHIEF COMPLAINT:   Chief Complaint  Patient presents with  . Abdominal Pain  . Emesis    HISTORY OF PRESENT ILLNESS: Shawn [name]  is a 33 y.o. male with a known history of chronic kidney disease stage III, diabetes mellitus type 2, GERD, hypertension, osteomyelitis in the past presented to emergency room for nausea and vomiting.  Patient's blood sugars have been elevated on 720 mg/dL.  Anion gap was normal.  He was given insulin and blood sugars came down.  He has redness in the left heel along with callus.  Appears to be infected and patient started on antibiotics in the emergency room.  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  Hospitalist service was consulted.  PAST MEDICAL HISTORY:   Past Medical History:  Diagnosis Date  . CKD (chronic kidney disease)   . Diabetes mellitus without complication ([place])   . GERD (gastroesophageal reflux disease)   . HTN (hypertension)   . IBS (irritable bowel syndrome)   . Osteomyelitis ([place])     PAST SURGICAL HISTORY:  Past Surgical History:  Procedure Laterality Date  . ABDOMINAL AORTOGRAM W/LOWER EXTREMITY Right 12/23/2017   Procedure: ABDOMINAL AORTOGRAM W/LOWER EXTREMITY;  Surgeon: Katha Cabal, MD;  Location: [facility];  Service: Cardiovascular;  Laterality: Right;  . ACHILLES TENDON SURGERY Bilateral 06/16/2018   Procedure: ACHILLES LENGTHENING/KIDNER;  Surgeon: Albertine Patricia, DPM;  Location: [facility];  Service: Podiatry;  Laterality: Bilateral;  . AMPUTATION Right 12/24/2017   Procedure: AMPUTATION RAY;  Surgeon: Sharlotte Alamo, DPM;  Location: [facility];  Service: Podiatry;  Laterality: Right;  . AMPUTATION Left 04/06/2018   Procedure: AMPUTATION RAY;  Surgeon: Sharlotte Alamo, DPM;  Location: [facility];  Service: Podiatry;  Laterality: Left;  . AMPUTATION Left 04/09/2018   Procedure: AMPUTATION RAY;  Surgeon: Sharlotte Alamo, DPM;  Location: [facility];  Service: Podiatry;  Laterality: Left;  . APPLICATION OF WOUND VAC Right 12/24/2017   Procedure: APPLICATION OF WOUND VAC;  Surgeon: Sharlotte Alamo, DPM;  Location: [facility];  Service: Podiatry;  Laterality: Right;  . APPLICATION OF WOUND VAC Right 01/01/2018   Procedure: APPLICATION OF WOUND VAC;  Surgeon: Albertine Patricia, DPM;  Location: [facility];  Service: Podiatry;  Laterality: Right;  . BONE EXCISION Bilateral 06/16/2018   Procedure: BONE EXCISION AND SOFT TISSUE;  Surgeon: Albertine Patricia, DPM;  Location: [facility];  Service: Podiatry;  Laterality: Bilateral;  . IRRIGATION AND DEBRIDEMENT FOOT Right 12/20/2017   Procedure: IRRIGATION AND DEBRIDEMENT FOOT;  Surgeon: Sharlotte Alamo, DPM;  Location: [facility];  Service: Podiatry;  Laterality: Right;  . IRRIGATION AND DEBRIDEMENT FOOT Right 12/24/2017   Procedure: IRRIGATION AND DEBRIDEMENT FOOT;  Surgeon: Sharlotte Alamo, DPM;  Location: [facility];  Service: Podiatry;  Laterality: Right;  . IRRIGATION AND DEBRIDEMENT FOOT Right 01/01/2018   Procedure: IRRIGATION AND DEBRIDEMENT FOOT-SKIN,SOFT TISSUE AND BONE;  Surgeon: Albertine Patricia, DPM;  Location: [facility];  Service: Podiatry;  Laterality: Right;  . IRRIGATION AND DEBRIDEMENT FOOT Left 04/06/2018   Procedure: IRRIGATION AND DEBRIDEMENT FOOT;  Surgeon: Sharlotte Alamo, DPM;  Location: [facility];  Service: Podiatry;  Laterality: Left;  . LOWER EXTREMITY ANGIOGRAPHY Left 04/06/2018   Procedure: Lower Extremity Angiography;  Surgeon: Algernon Huxley, MD;  Location: [facility];  Service: Cardiovascular;  Laterality: Left;    SOCIAL HISTORY:  Social History   Tobacco Use  . Smoking status: Never Smoker  . Smokeless tobacco: Never Used  Substance Use Topics  . Alcohol use: No     Frequency: Never    FAMILY HISTORY:  Family History  Problem Relation Age of Onset  . Diabetes Mother   . Ovarian cancer Mother   . Healthy Father     DRUG ALLERGIES:  Allergies  Allergen Reactions  . Banana Hives, Nausea And Vomiting and Rash  . Keflex [Cephalexin] Rash    No swelling- also taken penicillin without any issue.  . Onion Hives, Nausea And Vomiting and Rash  . Sulfa Antibiotics Anaphylaxis  . Grapeseed Extract [Nutritional Supplements] Itching  . Shellfish Allergy Hives    "ALL SEAFOOD"  . Grape Seed Rash    REVIEW OF SYSTEMS:   CONSTITUTIONAL: No fever, fatigue or weakness.  EYES: No blurred or double vision.  EARS, NOSE, AND THROAT: No tinnitus or ear pain.  RESPIRATORY: No cough, shortness of breath, wheezing or hemoptysis.  CARDIOVASCULAR: No chest pain, orthopnea, edema.  GASTROINTESTINAL: Has nausea, vomiting, no diarrhea  mild abdominal pain.  GENITOURINARY: No dysuria, hematuria.  ENDOCRINE: No polyuria, nocturia,  HEMATOLOGY: No anemia, easy bruising or bleeding SKIN: No rash or lesion. MUSCULOSKELETAL: No joint pain or arthritis.   Pain in the right foot heel NEUROLOGIC: No tingling, numbness, weakness.  PSYCHIATRY: No anxiety or depression.   MEDICATIONS AT HOME:  Prior to Admission medications   Medication Sig Start Date End Date Taking? Authorizing Provider  ALPRAZolam (XANAX) 0.25 MG tablet Take 1 tablet (0.25 mg total) by mouth 3 (three) times daily as needed for anxiety. 08/18/18  Yes Gladstone Lighter, MD  calcium carbonate (CALCIUM 600) 600 MG TABS tablet Take 1 tablet (600 mg total) by mouth 2 (two) times daily with a meal for 30 days. 09/13/18 10/13/18 Yes Milinda Pointer, MD  Cyanocobalamin (VITAMIN B-12) 5000 MCG SUBL Place 1 tablet (5,000 mcg total) under the tongue daily. 09/13/18 12/12/18 Yes Milinda Pointer, MD  diphenoxylate-atropine (LOMOTIL) 2.5-0.025 MG tablet Take 1 tablet by mouth 4 (four) times daily as needed for  diarrhea or loose stools.   Yes [provider]  ergocalciferol (VITAMIN D2) 1.25 MG (50000 UT) capsule Take 1 capsule (50,000 Units total) by mouth 2 (two) times a week. X 6 weeks. Patient taking differently: Take 50,000 Units by mouth 2 (two) times a week. X 6 weeks. Take on Monday and friday 09/14/18 10/26/18 Yes Milinda Pointer, MD  ferrous sulfate 325 (65 FE) MG tablet Take 1 tablet (325 mg total) by mouth 2 (two) times daily with a meal. 01/03/18  Yes Sainani, Belia Heman, MD  gabapentin (NEURONTIN) 100 MG capsule Take 1-3 capsules (100-300 mg total) by mouth 4 (four) times daily. Follow the written titration schedule. Patient taking differently: Take 300 mg by mouth 2 (two) times daily. Follow the written titration schedule. 09/13/18 12/12/18 Yes Milinda Pointer, MD  insulin aspart (NOVOLOG) 100 UNIT/ML injection Inject 4 Units into the skin 3 (three) times daily with meals. 08/18/18  Yes Gladstone Lighter, MD  insulin glargine (LANTUS) 100 UNIT/ML injection Inject 0.3 mLs (30 Units total) into the skin daily. Patient taking differently: Inject 35 Units into the skin 2 (two) times daily.  08/18/18  Yes Gladstone Lighter, MD  lisinopril (PRINIVIL,ZESTRIL) 10 MG tablet Take 1 tablet (10 mg total)  by mouth daily. 08/18/18  Yes Gladstone Lighter, MD  oxyCODONE-acetaminophen (PERCOCET) 10-325 MG tablet Take 1 tablet by mouth every 4 (four) hours as needed for pain.   Yes [provider]  promethazine (PHENERGAN) 25 MG tablet Take 1 tablet (25 mg total) by mouth every 6 (six) hours as needed for nausea or vomiting. 08/18/18  Yes Gladstone Lighter, MD  vitamin C (VITAMIN C) 250 MG tablet Take 1 tablet (250 mg total) by mouth 2 (two) times daily. 06/20/18  Yes Gladstone Lighter, MD  zolpidem (AMBIEN) 10 MG tablet Take 10 mg by mouth at bedtime.    Yes [provider]  Cholecalciferol (VITAMIN D3) 125 MCG (5000 UT) CAPS Take 1 capsule (5,000 Units total) by mouth daily with  breakfast. Take along with calcium and magnesium. 09/13/18 03/12/19  Milinda Pointer, MD  Insulin Syringes, Disposable, U-100 0.3 ML MISC 1 Syringe by Does not apply route 4 (four) times daily -  with meals and at bedtime. 12/27/17   Loletha Grayer, MD  Magnesium 500 MG CAPS Take 1 capsule (500 mg total) by mouth 2 (two) times daily at 8 am and 10 pm. 09/13/18 03/12/19  Milinda Pointer, MD  ondansetron (ZOFRAN ODT) 4 MG disintegrating tablet Take 1 tablet (4 mg total) by mouth every 8 (eight) hours as needed for nausea or vomiting. 09/03/18   Harvest Dark, MD  potassium chloride (K-DUR) 10 MEQ tablet Take 10 mEq by mouth daily.    [provider]      PHYSICAL EXAMINATION:   VITAL SIGNS: Blood pressure (!) 167/101, pulse 100, temperature 98.2 F (36.8 C), temperature source Oral, resp. rate 18, height 5\' 6"  (1.676 m), weight 54.4 kg, SpO2 100 %.  GENERAL:  33 y.o.-year-old patient lying in the bed with no acute distress.  EYES: Pupils equal, round, reactive to light and accommodation. No scleral icterus. Extraocular muscles intact.  HEENT: Head atraumatic, normocephalic. Oropharynx and nasopharynx clear.  NECK:  Supple, no jugular venous distention. No thyroid enlargement, no tenderness.  LUNGS: Normal breath sounds bilaterally, no wheezing, rales,rhonchi or crepitation. No use of accessory muscles of respiration.  CARDIOVASCULAR: S1, S2 normal. No murmurs, rubs, or gallops.  ABDOMEN: Soft, nontender, nondistended. Bowel sounds present. No organomegaly or mass.  EXTREMITIES: No pedal edema,  No cyanosis Bilateral fourth and fifth toes amputation Blackish discharge noted on the right foot heel Redness also noted in the right heel and foot NEUROLOGIC: Cranial nerves II through XII are intact. Muscle strength 5/5 in all extremities. Sensation intact. Gait not checked.  PSYCHIATRIC: The patient is alert and oriented x 3.  SKIN:     LABORATORY PANEL:   CBC Recent Labs  Lab  09/19/18 1723  WBC 13.5*  HGB 9.0*  HCT 25.6*  PLT 355  MCV 78.0*  MCH 27.4  MCHC 35.2  RDW 12.5   ------------------------------------------------------------------------------------------------------------------  Chemistries  Recent Labs  Lab 09/19/18 1723  NA 124*  K 3.6  CL 90*  CO2 22  GLUCOSE 720*  BUN 34*  CREATININE 1.94*  CALCIUM 8.1*  AST 13*  ALT 9  ALKPHOS 118  BILITOT 0.5   ------------------------------------------------------------------------------------------------------------------ estimated creatinine clearance is 42.8 mL/min (A) (by C-G formula based on SCr of 1.94 mg/dL (H)). ------------------------------------------------------------------------------------------------------------------ No results for input(s): TSH, T4TOTAL, T3FREE, THYROIDAB in the last 72 hours.  Invalid input(s): FREET3   Coagulation profile No results for input(s): INR, PROTIME in the last 168 hours. ------------------------------------------------------------------------------------------------------------------- No results for input(s): DDIMER in the last 72 hours. -------------------------------------------------------------------------------------------------------------------  Cardiac Enzymes No results for input(s): CKMB, TROPONINI, MYOGLOBIN in the last 168 hours.  Invalid input(s): CK ------------------------------------------------------------------------------------------------------------------ Invalid input(s): POCBNP  ---------------------------------------------------------------------------------------------------------------  Urinalysis    Component Value Date/Time   COLORURINE STRAW (A) 09/19/2018 1815   APPEARANCEUR HAZY (A) 09/19/2018 1815   APPEARANCEUR Clear 10/06/2014 0758   LABSPEC 1.020 09/19/2018 1815   LABSPEC 1.031 10/06/2014 0758   PHURINE 5.0 09/19/2018 1815   GLUCOSEU >=500 (A) 09/19/2018 1815   GLUCOSEU >=500 10/06/2014 0758    HGBUR SMALL (A) 09/19/2018 1815   BILIRUBINUR NEGATIVE 09/19/2018 1815   BILIRUBINUR Negative 10/06/2014 0758   KETONESUR NEGATIVE 09/19/2018 1815   PROTEINUR 100 (A) 09/19/2018 1815   UROBILINOGEN 0.2 03/29/2007 1506   NITRITE NEGATIVE 09/19/2018 1815   LEUKOCYTESUR NEGATIVE 09/19/2018 1815   LEUKOCYTESUR Negative 10/06/2014 0758     RADIOLOGY: Dg Foot 2 Views Right  Result Date: 09/19/2018 CLINICAL DATA:  Right foot pain EXAM: RIGHT FOOT - 2 VIEW COMPARISON:  07/03/2018 FINDINGS: Postsurgical changes are noted consistent with amputation of the fourth and fifth digits. The proximal aspect of the fourth metatarsal remains. Changes of prior fracture in the third metatarsal are noted with some callus formation although overall nonunion. No new fracture is seen. There is soft tissue air identified inferiorly consistent with diabetic foot ulcer. No underlying bony erosion is noted. IMPRESSION: Soft tissue wound without underlying bony abnormality. Chronic changes as described above. Electronically Signed   By: Inez Catalina M.D.   On: 09/19/2018 18:45    EKG: Orders placed or performed during the hospital encounter of 09/03/18  . ED EKG  . ED EKG  . EKG 12-Lead  . EKG 12-Lead    IMPRESSION AND PLAN: 33 year old male patient with a known history of chronic kidney disease stage III, diabetes mellitus type 2, GERD, hypertension, osteomyelitis in the past presented to emergency room for nausea and vomiting.  -Uncontrolled diabetes mellitus Admit patient to medical floor IV fluid hydration Resume Lantus insulin aggressive sliding scale coverage with insulin with mealtime insulin Monitor blood sugars closely Diabetic teaching  -Foot cellulitis Podiatry consult IV antibiotics that is IV vancomycin based on renal parameters  -Chronic kidney disease stage III Monitor renal function  -DVT prophylaxis subcu heparin daily  All the records are reviewed and case discussed with ED provider.  Management plans discussed with the patient, family and they are in agreement.  CODE STATUS:Full code Code Status History    Date Active Date Inactive Code Status Order ID Comments User Context   08/16/2018 2440 08/18/2018 2142 Full Code [id number]  Hillary Bow, MD ED   07/16/2018 1739 07/18/2018 0104 Full Code [id number]  Dustin Flock, MD Inpatient   07/10/2018 0111 07/11/2018 1955 Full Code [id number]  Gorden Harms, MD Inpatient   06/14/2018 1744 06/20/2018 2123 Full Code [id number]  Loletha Grayer, MD ED   05/28/2018 0437 05/28/2018 1926 Full Code [id number]  Arta Silence, MD ED   04/27/2018 1726 05/02/2018 1713 Full Code [id number]  Henreitta Leber, MD Inpatient   04/05/2018 1724 04/11/2018 2142 Full Code [id number]  Dustin Flock, MD Inpatient   02/06/2018 2022 02/07/2018 1512 Full Code [id number]  Demetrios Loll, MD Inpatient   01/10/2018 2259 01/11/2018 1947 Full Code [id number]  Amelia Jo, MD Inpatient   12/30/2017 0019 01/03/2018 1457 Full Code [id number]  Harrie Foreman, MD Inpatient   12/20/2017 1541 12/27/2017 2147 Full Code [id number]  Sharlotte Alamo, [facility] Inpatient   11/10/2017 0049 11/11/2017 1632 Full Code [id number]  Amelia Jo,  MD Inpatient   07/13/2017 2112 07/15/2017 1730 Full Code [id number]  Jules Husbands, MD ED       TOTAL TIME TAKING CARE OF THIS PATIENT: 54 minutes.    Saundra Shelling M.D on 09/19/2018 at 8:24 PM  Between 7am to 6pm - Pager - [phone]  After 6pm go to [email or web address] - password [email or web address] [facility]  Office  [phone]  CC: Primary care physician; Valera Castle, MD

## 2018-09-19 NOTE — ED Provider Notes (Signed)
[facility] Emergency Department Provider Note ____________________________________________   First MD Initiated Contact with Patient 09/19/18 1735     (approximate)  I have reviewed the triage vital signs and the nursing notes.   HISTORY  Chief Complaint Abdominal Pain and Emesis    HPI Shawn [name] is a 33 y.o. male with PMH as noted below who presents with nausea and vomiting over the last several days, gradual onset, associated with diarrhea as well as with elevated glucose despite continues take as insulin.  The patient also reports worsening right heel pain with a blister, which feels like when he has had an infection there previously.   Past Medical History:  Diagnosis Date   CKD (chronic kidney disease)    Diabetes mellitus without complication (HCC)    GERD (gastroesophageal reflux disease)    HTN (hypertension)    IBS (irritable bowel syndrome)    Osteomyelitis ([place])     Patient Active Problem List   Diagnosis Date Noted   Uncontrolled diabetes mellitus ([place]) 09/19/2018   Neurogenic pain 09/13/2018   Marijuana use 09/13/2018   Long term prescription benzodiazepine use 09/13/2018   Insulin-dependent diabetes mellitus with renal complications ([place]) 16/04/9603   Vitamin D deficiency 08/21/2018   Hypocalcemia 08/21/2018   CKD stage G3a/A1, GFR 45-59 and albumin creatinine ratio <30 mg/g ([place]) 08/21/2018   History of acute renal failure 08/21/2018   HHNC (hyperglycemic hyperosmolar nonketotic coma) ([place]) 08/16/2018   Chronic foot pain (Primary Area of Pain) (Bilateral) (L>R) 08/14/2018   Chronic lower extremity pain (Secondary Area of Pain) (Bilateral) (L>R) 08/14/2018   Chronic generalized abdominal pain (Tertiary Area of Pain) 08/14/2018   Chronic pain syndrome 08/14/2018   Long term current use of opiate analgesic 08/14/2018   Pharmacologic therapy 08/14/2018   Disorder of skeletal system 08/14/2018    Problems influencing health status 08/14/2018   Nausea & vomiting 07/16/2018   IV infusion line dysfunction ([place]) 07/09/2018   Hyperglycemic hyperosmolar nonketotic coma ([place]) 05/28/2018   Acute renal failure (ARF) ([place]) 04/27/2018   Left foot infection 04/05/2018   Foot infection 02/06/2018   Local infection of the skin and subcutaneous tissue, unspecified 02/06/2018   Hypertension associated with diabetes ([place]) 01/16/2018   Osteomyelitis ([place]) 01/10/2018   Cellulitis 12/29/2017   Sepsis ([place]) 12/18/2017   Moderate recurrent major depression ([place]) 11/10/2017   Type 2 diabetes mellitus with hyperosmolar nonketotic hyperglycemia ([place]) 11/09/2017   History of migraine 07/15/2017   IBS (irritable bowel syndrome) 07/15/2017   Protein-calorie malnutrition, severe 07/14/2017   Abdominal pain 07/13/2017   Diarrhea 06/01/2013   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   GERD (gastroesophageal reflux disease) 12/30/2009   Type 1 diabetes mellitus with diabetic polyneuropathy ([place]) 11/25/2009   MDD (major depressive disorder) 11/25/2009   Hypercholesterolemia 03/07/2008    Past Surgical History:  Procedure Laterality Date   ABDOMINAL AORTOGRAM W/LOWER EXTREMITY Right 12/23/2017   Procedure: ABDOMINAL AORTOGRAM W/LOWER EXTREMITY;  Surgeon: Katha Cabal, MD;  Location: [place] [facility];  Service: Cardiovascular;  Laterality: Right;   ACHILLES TENDON SURGERY Bilateral 06/16/2018   Procedure: ACHILLES LENGTHENING/KIDNER;  Surgeon: Albertine Patricia, DPM;  Location: [facility];  Service: Podiatry;  Laterality: Bilateral;   AMPUTATION Right 12/24/2017   Procedure: AMPUTATION RAY;  Surgeon: Sharlotte Alamo, DPM;  Location: [facility];  Service: Podiatry;  Laterality: Right;   AMPUTATION Left 04/06/2018   Procedure: AMPUTATION RAY;  Surgeon: Sharlotte Alamo, DPM;  Location: [facility];  Service: Podiatry;  Laterality: Left;  AMPUTATION Left 04/09/2018   Procedure: AMPUTATION RAY;   Surgeon: Sharlotte Alamo, DPM;  Location: [facility];  Service: Podiatry;  Laterality: Left;   APPLICATION OF WOUND VAC Right 12/24/2017   Procedure: APPLICATION OF WOUND VAC;  Surgeon: Sharlotte Alamo, DPM;  Location: [facility];  Service: Podiatry;  Laterality: Right;   APPLICATION OF WOUND VAC Right 01/01/2018   Procedure: APPLICATION OF WOUND VAC;  Surgeon: Albertine Patricia, DPM;  Location: [facility];  Service: Podiatry;  Laterality: Right;   BONE EXCISION Bilateral 06/16/2018   Procedure: BONE EXCISION AND SOFT TISSUE;  Surgeon: Albertine Patricia, DPM;  Location: [facility];  Service: Podiatry;  Laterality: Bilateral;   IRRIGATION AND DEBRIDEMENT FOOT Right 12/20/2017   Procedure: IRRIGATION AND DEBRIDEMENT FOOT;  Surgeon: Sharlotte Alamo, DPM;  Location: [facility];  Service: Podiatry;  Laterality: Right;   IRRIGATION AND DEBRIDEMENT FOOT Right 12/24/2017   Procedure: IRRIGATION AND DEBRIDEMENT FOOT;  Surgeon: Sharlotte Alamo, DPM;  Location: [facility];  Service: Podiatry;  Laterality: Right;   IRRIGATION AND DEBRIDEMENT FOOT Right 01/01/2018   Procedure: IRRIGATION AND DEBRIDEMENT FOOT-SKIN,SOFT TISSUE AND BONE;  Surgeon: Albertine Patricia, DPM;  Location: [facility];  Service: Podiatry;  Laterality: Right;   IRRIGATION AND DEBRIDEMENT FOOT Left 04/06/2018   Procedure: IRRIGATION AND DEBRIDEMENT FOOT;  Surgeon: Sharlotte Alamo, DPM;  Location: [facility];  Service: Podiatry;  Laterality: Left;   LOWER EXTREMITY ANGIOGRAPHY Left 04/06/2018   Procedure: Lower Extremity Angiography;  Surgeon: Algernon Huxley, MD;  Location: [place] [facility];  Service: Cardiovascular;  Laterality: Left;    Prior to Admission medications   Medication Sig Start Date End Date Taking? Authorizing Provider  ALPRAZolam (XANAX) 0.25 MG tablet Take 1 tablet (0.25 mg total) by mouth 3 (three) times daily as needed for anxiety. 08/18/18  Yes Gladstone Lighter, MD  calcium carbonate (CALCIUM 600) 600 MG TABS tablet Take 1 tablet (600 mg total) by mouth  2 (two) times daily with a meal for 30 days. 09/13/18 10/13/18 Yes Milinda Pointer, MD  Cyanocobalamin (VITAMIN B-12) 5000 MCG SUBL Place 1 tablet (5,000 mcg total) under the tongue daily. 09/13/18 12/12/18 Yes Milinda Pointer, MD  diphenoxylate-atropine (LOMOTIL) 2.5-0.025 MG tablet Take 1 tablet by mouth 4 (four) times daily as needed for diarrhea or loose stools.   Yes [provider]  ergocalciferol (VITAMIN D2) 1.25 MG (50000 UT) capsule Take 1 capsule (50,000 Units total) by mouth 2 (two) times a week. X 6 weeks. Patient taking differently: Take 50,000 Units by mouth 2 (two) times a week. X 6 weeks. Take on Monday and friday 09/14/18 10/26/18 Yes Milinda Pointer, MD  ferrous sulfate 325 (65 FE) MG tablet Take 1 tablet (325 mg total) by mouth 2 (two) times daily with a meal. 01/03/18  Yes Sainani, Belia Heman, MD  gabapentin (NEURONTIN) 100 MG capsule Take 1-3 capsules (100-300 mg total) by mouth 4 (four) times daily. Follow the written titration schedule. Patient taking differently: Take 300 mg by mouth 2 (two) times daily. Follow the written titration schedule. 09/13/18 12/12/18 Yes Milinda Pointer, MD  insulin aspart (NOVOLOG) 100 UNIT/ML injection Inject 4 Units into the skin 3 (three) times daily with meals. 08/18/18  Yes Gladstone Lighter, MD  insulin glargine (LANTUS) 100 UNIT/ML injection Inject 0.3 mLs (30 Units total) into the skin daily. Patient taking differently: Inject 35 Units into the skin 2 (two) times daily.  08/18/18  Yes Gladstone Lighter, MD  lisinopril (PRINIVIL,ZESTRIL) 10 MG tablet Take 1 tablet (10 mg total)  by mouth daily. 08/18/18  Yes Gladstone Lighter, MD  oxyCODONE-acetaminophen (PERCOCET) 10-325 MG tablet Take 1 tablet by mouth every 4 (four) hours as needed for pain.   Yes [provider]  promethazine (PHENERGAN) 25 MG tablet Take 1 tablet (25 mg total) by mouth every 6 (six) hours as needed for nausea or vomiting. 08/18/18  Yes Gladstone Lighter, MD    vitamin C (VITAMIN C) 250 MG tablet Take 1 tablet (250 mg total) by mouth 2 (two) times daily. 06/20/18  Yes Gladstone Lighter, MD  zolpidem (AMBIEN) 10 MG tablet Take 10 mg by mouth at bedtime.    Yes [provider]  Cholecalciferol (VITAMIN D3) 125 MCG (5000 UT) CAPS Take 1 capsule (5,000 Units total) by mouth daily with breakfast. Take along with calcium and magnesium. 09/13/18 03/12/19  Milinda Pointer, MD  Insulin Syringes, Disposable, U-100 0.3 ML MISC 1 Syringe by Does not apply route 4 (four) times daily -  with meals and at bedtime. 12/27/17   Loletha Grayer, MD  Magnesium 500 MG CAPS Take 1 capsule (500 mg total) by mouth 2 (two) times daily at 8 am and 10 pm. 09/13/18 03/12/19  Milinda Pointer, MD  ondansetron (ZOFRAN ODT) 4 MG disintegrating tablet Take 1 tablet (4 mg total) by mouth every 8 (eight) hours as needed for nausea or vomiting. 09/03/18   Harvest Dark, MD  potassium chloride (K-DUR) 10 MEQ tablet Take 10 mEq by mouth daily.    [provider]    Allergies Banana; Keflex [cephalexin]; Onion; Sulfa antibiotics; Grapeseed extract [nutritional supplements]; Shellfish allergy; and Grape seed  Family History  Problem Relation Age of Onset   Diabetes Mother    Ovarian cancer Mother    Healthy Father     Social History Social History   Tobacco Use   Smoking status: Never Smoker   Smokeless tobacco: Never Used  Substance Use Topics   Alcohol use: No    Frequency: Never   Drug use: Yes    Types: Marijuana, PCP    Review of Systems  Constitutional: No fever. Eyes: No redness. ENT: No sore throat. Cardiovascular: Denies chest pain. Respiratory: Denies shortness of breath. Gastrointestinal: Positive for vomiting and diarrhea.  Genitourinary: Negative for dysuria.  Musculoskeletal: Negative for back pain.  Positive for right heel pain. Skin: Positive for rash. Neurological: Negative for  headache.   ____________________________________________   PHYSICAL EXAM:  VITAL SIGNS: ED Triage Vitals [09/19/18 1721]  Enc Vitals Group     BP 135/87     Pulse Rate (!) 115     Resp 18     Temp 98.2 F (36.8 C)     Temp Source Oral     SpO2 99 %     Weight 120 lb (54.4 kg)     Height 5\' 6"  (1.676 m)     Head Circumference      Peak Flow      Pain Score 10     Pain Loc      Pain Edu?      Excl. in [place]?     Constitutional: Alert and oriented.  Relatively well appearing and in no acute distress. Eyes: Conjunctivae are normal.  Head: Atraumatic. Nose: No congestion/rhinnorhea. Mouth/Throat: Mucous membranes are dry I.   Neck: Normal range of motion.  Cardiovascular: Tachycardic, regular rhythm.  Good peripheral circulation. Respiratory: Normal respiratory effort.  No retractions.  Gastrointestinal: Soft and nontender. No distention.  Genitourinary: No flank tenderness. Musculoskeletal: No lower extremity  edema.  Extremities warm and well perfused.  Right heel with approximately 3 cm eschar with surrounding erythema and induration.  No open ulcer or purulent drainage. Neurologic:  Normal speech and language. No gross focal neurologic deficits are appreciated.  Skin:  Skin is warm and dry. No rash noted. Psychiatric: Mood and affect are normal. Speech and behavior are normal.  ____________________________________________   LABS (all labs ordered are listed, but only abnormal results are displayed)  Labs Reviewed  LIPASE, BLOOD - Abnormal; Notable for the following components:      Result Value   Lipase 72 (*)    All other components within normal limits  COMPREHENSIVE METABOLIC PANEL - Abnormal; Notable for the following components:   Sodium 124 (*)    Chloride 90 (*)    Glucose, Bld 720 (*)    BUN 34 (*)    Creatinine, Ser 1.94 (*)    Calcium 8.1 (*)    Total Protein 6.2 (*)    Albumin 2.2 (*)    AST 13 (*)    GFR calc non Af Amer 45 (*)    GFR calc Af  Amer 52 (*)    All other components within normal limits  CBC - Abnormal; Notable for the following components:   WBC 13.5 (*)    RBC 3.28 (*)    Hemoglobin 9.0 (*)    HCT 25.6 (*)    MCV 78.0 (*)    All other components within normal limits  URINALYSIS, COMPLETE (UACMP) WITH MICROSCOPIC - Abnormal; Notable for the following components:   Color, Urine STRAW (*)    APPearance HAZY (*)    Glucose, UA >=500 (*)    Hgb urine dipstick SMALL (*)    Protein, ur 100 (*)    All other components within normal limits  GLUCOSE, CAPILLARY - Abnormal; Notable for the following components:   Glucose-Capillary >600 (*)    All other components within normal limits  GLUCOSE, CAPILLARY - Abnormal; Notable for the following components:   Glucose-Capillary 536 (*)    All other components within normal limits   ____________________________________________  EKG   ____________________________________________  RADIOLOGY  XR R foot: Soft tissue wound with no bony abnormality  ____________________________________________   PROCEDURES  Procedure(s) performed: No  Procedures  Critical Care performed: No ____________________________________________   INITIAL IMPRESSION / ASSESSMENT AND PLAN / ED COURSE  Pertinent labs & imaging results that were available during my care of the patient were reviewed by me and considered in my medical decision making (see chart for details).  33 year old male with a history of diabetes and other PMH as noted above presents with nausea, vomiting, diarrhea, epigastric abdominal pain, elevated glucose, as well as with worsening pain to his right heel where he has a chronic wound.  I reviewed the past medical records in epic.  The patient was last seen in the ED at the beginning of this month with GI symptoms and hyperglycemia, and treated and released.  He presented with epigastric abdominal pain last month.  His last admission was also last month for hyperosmolar  nonketotic coma.  On exam today the patient is alert and relatively well-appearing.  He is tachycardic with otherwise normal vital signs.  The abdomen is soft and nontender.  He does have dry mucous membranes.  Exam of the right lower extremity reveals an approximately 3 cm chronic appearing eschar at the right heel with surrounding 2-3 cm of erythema, warmth, and slight induration concerning for acute cellulitis.  Overall presentation is most consistent with HHNC versus possible DKA, or hyperglycemia related to gastritis and/or gastroparesis, and vomiting.  There is also likely a component of cellulitis.  We will give fluids and insulin, obtain labs, x-ray of the right foot, and reassess.  ----------------------------------------- 8:14 PM on 09/19/2018 -----------------------------------------  X-ray of the foot shows no bony abnormality.  The glucose has improved somewhat after fluids and insulin.  There is no elevated anion gap or ketones on the UA, so the presentation is not consistent with DKA.  However, given the significant hyperglycemia and the likely active cellulitis I will admit the patient for IV antibiotics and further management of his glucose.  The patient agrees with this plan.  I signed the patient out to the hospitalist Dr. Estanislado Pandy. ____________________________________________   FINAL CLINICAL IMPRESSION(S) / ED DIAGNOSES  Final diagnoses:  Hyperglycemia  Nausea vomiting and diarrhea  Cellulitis of right foot      NEW MEDICATIONS STARTED DURING THIS VISIT:  New Prescriptions   No medications on file     Note:  This document was prepared using Dragon voice recognition software and may include unintentional dictation errors.    Arta Silence, MD 09/19/18 2015

## 2018-09-19 NOTE — ED Notes (Signed)
Date and time results received: 09/19/18 6:35 PM    Test: Glucose Critical Value: 720  Name of Provider Notified: Cherylann Banas, MD

## 2018-09-19 NOTE — ED Triage Notes (Signed)
Pt to triage via wheelchair.  Pt has n/v and abd pain.  Pt is diabetic. Pt reports 480 fsbs at home this am.  Pt alert.

## 2018-09-19 NOTE — Consult Note (Signed)
Pharmacy Antibiotic Note  Shawn [name] is a 33 y.o. male admitted on 09/19/2018 with Foot Infection .  Pharmacy has been consulted for Vancomycin dosing.  Plan: Give Vancomycin 1250mg  x 1 dose, followed by vancomycin 750mg  IV Q 24 hours.  Goal AUC 400-550. Expected AUC: 479.2 SCr used: 1.94  Height: 5\' 6"  (167.6 cm) Weight: 120 lb (54.4 kg) IBW/kg (Calculated) : 63.8  Temp (24hrs), Avg:98.7 F (37.1 C), Min:98.2 F (36.8 C), Max:99.1 F (37.3 C)  Recent Labs  Lab 09/19/18 1723  WBC 13.5*  CREATININE 1.94*    Estimated Creatinine Clearance: 42.8 mL/min (A) (by C-G formula based on SCr of 1.94 mg/dL (H)).    Allergies  Allergen Reactions  . Banana Hives, Nausea And Vomiting and Rash  . Keflex [Cephalexin] Rash    No swelling- also taken penicillin without any issue.  . Onion Hives, Nausea And Vomiting and Rash  . Sulfa Antibiotics Anaphylaxis  . Grapeseed Extract [Nutritional Supplements] Itching  . Shellfish Allergy Hives    "ALL SEAFOOD"  . Grape Seed Rash   Antimicrobials this admission: 3/17 Vancomycin  >>   Thank you for allowing pharmacy to be a part of this patient's care.  Pernell Dupre, PharmD, BCPS Clinical Pharmacist 09/19/2018 10:29 PM

## 2018-09-19 NOTE — ED Notes (Signed)
fsbs greater than 600

## 2018-09-19 NOTE — ED Notes (Signed)
ED TO INPATIENT HANDOFF REPORT  ED Nurse Name and Phone #: Anderson Malta [phone]  S Name/Age/Gender Shawn [name] 33 y.o. male Room/Bed: ED15A/ED15A  Code Status   Code Status: Prior  Home/SNF/Other Home Patient oriented to: self, place, time and situation Is this baseline? Yes   Triage Complete: Triage complete  Chief Complaint vomiting and diarrhea  Triage Note Pt to triage via wheelchair.  Pt has n/v and abd pain.  Pt is diabetic. Pt reports 480 fsbs at home this am.  Pt alert.     Allergies Allergies  Allergen Reactions  . Banana Hives, Nausea And Vomiting and Rash  . Keflex [Cephalexin] Rash    No swelling- also taken penicillin without any issue.  . Onion Hives, Nausea And Vomiting and Rash  . Sulfa Antibiotics Anaphylaxis  . Grapeseed Extract [Nutritional Supplements] Itching  . Shellfish Allergy Hives    "ALL SEAFOOD"  . Grape Seed Rash    Level of Care/Admitting Diagnosis ED Disposition    ED Disposition Condition [facility] Area: [place] [[id number]]  Level of Care: Med-Surg [16]  Diagnosis: Uncontrolled diabetes mellitus [facility]) [[id number]]  Admitting Physician: Saundra Shelling [[id number]]  Attending Physician: Saundra Shelling [[id number]]  Estimated length of stay: past midnight tomorrow  Certification:: I certify this patient will need inpatient services for at least 2 midnights  PT Class (Do Not Modify): Inpatient [101]  PT Acc Code (Do Not Modify): Private [1]       B Medical/Surgery History Past Medical History:  Diagnosis Date  . CKD (chronic kidney disease)   . Diabetes mellitus without complication ([place])   . GERD (gastroesophageal reflux disease)   . HTN (hypertension)   . IBS (irritable bowel syndrome)   . Osteomyelitis [facility])    Past Surgical History:  Procedure Laterality Date  . ABDOMINAL AORTOGRAM W/LOWER EXTREMITY Right 12/23/2017   Procedure: ABDOMINAL AORTOGRAM W/LOWER EXTREMITY;  Surgeon: Katha Cabal, MD;  Location: [facility];  Service: Cardiovascular;  Laterality: Right;  . ACHILLES TENDON SURGERY Bilateral 06/16/2018   Procedure: ACHILLES LENGTHENING/KIDNER;  Surgeon: Albertine Patricia, DPM;  Location: [facility] OR[facility];  Service: Podiatry;  Laterality: Bilateral;  . AMPUTATION Right 12/24/2017   Procedure: AMPUTATION RAY;  Surgeon: Sharlotte Alamo, DPM;  Location: [facility] OR[facility];  Service: Podiatry;  Laterality: Right;  . AMPUTATION Left 04/06/2018   Procedure: AMPUTATION RAY;  Surgeon: Sharlotte Alamo, DPM;  Location: [facility] OR[facility];  Service: Podiatry;  Laterality: Left;  . AMPUTATION Left 04/09/2018   Procedure: AMPUTATION RAY;  Surgeon: Sharlotte Alamo, DPM;  Location: [facility] OR[facility];  Service: Podiatry;  Laterality: Left;  . APPLICATION OF WOUND VAC Right 12/24/2017   Procedure: APPLICATION OF WOUND VAC;  Surgeon: Sharlotte Alamo, DPM;  Location: [facility] OR[facility];  Service: Podiatry;  Laterality: Right;  . APPLICATION OF WOUND VAC Right 01/01/2018   Procedure: APPLICATION OF WOUND VAC;  Surgeon: Albertine Patricia, DPM;  Location: [facility];  Service: Podiatry;  Laterality: Right;  . BONE EXCISION Bilateral 06/16/2018   Procedure: BONE EXCISION AND SOFT TISSUE;  Surgeon: Albertine Patricia, DPM;  Location: [facility];  Service: Podiatry;  Laterality: Bilateral;  . IRRIGATION AND DEBRIDEMENT FOOT Right 12/20/2017   Procedure: IRRIGATION AND DEBRIDEMENT FOOT;  Surgeon: Sharlotte Alamo, DPM;  Location: [facility] OR[facility];  Service: Podiatry;  Laterality: Right;  . IRRIGATION AND DEBRIDEMENT FOOT Right 12/24/2017   Procedure: IRRIGATION AND DEBRIDEMENT FOOT;  Surgeon: Sharlotte Alamo, DPM;  Location: [facility];  Service: Podiatry;  Laterality: Right;  .  IRRIGATION AND DEBRIDEMENT FOOT Right 01/01/2018   Procedure: IRRIGATION AND DEBRIDEMENT FOOT-SKIN,SOFT TISSUE AND BONE;  Surgeon: Albertine Patricia, DPM;  Location: [facility];  Service: Podiatry;  Laterality: Right;  . IRRIGATION AND DEBRIDEMENT FOOT Left 04/06/2018   Procedure: IRRIGATION AND  DEBRIDEMENT FOOT;  Surgeon: Sharlotte Alamo, DPM;  Location: [facility] OR[facility];  Service: Podiatry;  Laterality: Left;  . LOWER EXTREMITY ANGIOGRAPHY Left 04/06/2018   Procedure: Lower Extremity Angiography;  Surgeon: Algernon Huxley, MD;  Location: [place] [facility];  Service: Cardiovascular;  Laterality: Left;     A IV Location/Drains/Wounds Patient Lines/Drains/Airways Status   Active Line/Drains/Airways    Name:   Placement date:   Placement time:   Site:   Days:   Peripheral IV 09/19/18 Right Antecubital   09/19/18    1815    Antecubital   less than 1          Intake/Output Last 24 hours No intake or output data in the 24 hours ending 09/19/18 2015  Labs/Imaging Results for orders placed or performed during the hospital encounter of 09/19/18 (from the past 48 hour(s))  Lipase, blood     Status: Abnormal   Collection Time: 09/19/18  5:23 PM  Result Value Ref Range   Lipase 72 (H) 11 - 51 U/L    Comment: Performed at [facility], [address] [place], [place] [address]  Comprehensive metabolic panel     Status: Abnormal   Collection Time: 09/19/18  5:23 PM  Result Value Ref Range   Sodium 124 (L) 135 - 145 mmol/L   Potassium 3.6 3.5 - 5.1 mmol/L   Chloride 90 (L) 98 - 111 mmol/L   CO2 22 22 - 32 mmol/L   Glucose, Bld 720 (HH) 70 - 99 mg/dL    Comment: CRITICAL RESULT CALLED TO, READ BACK BY AND VERIFIED WITH Risa Auman WITHLEY @ 1830 ON 09/19/18 BY JUW    BUN 34 (H) 6 - 20 mg/dL   Creatinine, Ser 1.94 (H) 0.61 - 1.24 mg/dL   Calcium 8.1 (L) 8.9 - 10.3 mg/dL   Total Protein 6.2 (L) 6.5 - 8.1 g/dL   Albumin 2.2 (L) 3.5 - 5.0 g/dL   AST 13 (L) 15 - 41 U/L   ALT 9 0 - 44 U/L   Alkaline Phosphatase 118 38 - 126 U/L   Total Bilirubin 0.5 0.3 - 1.2 mg/dL   GFR calc non Af Amer 45 (L) >60 mL/min   GFR calc Af Amer 52 (L) >60 mL/min   Anion gap 12 5 - 15    Comment: Performed at [facility], [address] [place], [place] [address]  CBC     Status: Abnormal    Collection Time: 09/19/18  5:23 PM  Result Value Ref Range   WBC 13.5 (H) 4.0 - 10.5 K/uL   RBC 3.28 (L) 4.22 - 5.81 MIL/uL   Hemoglobin 9.0 (L) 13.0 - 17.0 g/dL   HCT 25.6 (L) 39.0 - 52.0 %   MCV 78.0 (L) 80.0 - 100.0 fL   MCH 27.4 26.0 - 34.0 pg   MCHC 35.2 30.0 - 36.0 g/dL   RDW 12.5 11.5 - 15.5 %   Platelets 355 150 - 400 K/uL   nRBC 0.0 0.0 - 0.2 %    Comment: Performed at [facility], [address] [place], [place] [address]  Glucose, capillary     Status: Abnormal   Collection Time: 09/19/18  5:24 PM  Result Value Ref  Range   Glucose-Capillary >600 (HH) 70 - 99 mg/dL   Comment 1 Notify RN    Comment 2 Document in Chart   Urinalysis, Complete w Microscopic     Status: Abnormal   Collection Time: 09/19/18  6:15 PM  Result Value Ref Range   Color, Urine STRAW (A) YELLOW   APPearance HAZY (A) CLEAR   Specific Gravity, Urine 1.020 1.005 - 1.030   pH 5.0 5.0 - 8.0   Glucose, UA >=500 (A) NEGATIVE mg/dL   Hgb urine dipstick SMALL (A) NEGATIVE   Bilirubin Urine NEGATIVE NEGATIVE   Ketones, ur NEGATIVE NEGATIVE mg/dL   Protein, ur 100 (A) NEGATIVE mg/dL   Nitrite NEGATIVE NEGATIVE   Leukocytes,Ua NEGATIVE NEGATIVE   RBC / HPF 6-10 0 - 5 RBC/hpf   WBC, UA 0-5 0 - 5 WBC/hpf   Bacteria, UA NONE SEEN NONE SEEN   Squamous Epithelial / LPF 0-5 0 - 5   Amorphous Crystal PRESENT     Comment: Performed at [facility], [address] [place], [place] [address]  Glucose, capillary     Status: Abnormal   Collection Time: 09/19/18  7:38 PM  Result Value Ref Range   Glucose-Capillary 536 (HH) 70 - 99 mg/dL   Comment 1 Document in Chart    Dg Foot 2 Views Right  Result Date: 09/19/2018 CLINICAL DATA:  Right foot pain EXAM: RIGHT FOOT - 2 VIEW COMPARISON:  07/03/2018 FINDINGS: Postsurgical changes are noted consistent with amputation of the fourth and fifth digits. The proximal aspect of the fourth metatarsal remains. Changes of prior fracture in the third  metatarsal are noted with some callus formation although overall nonunion. No new fracture is seen. There is soft tissue air identified inferiorly consistent with diabetic foot ulcer. No underlying bony erosion is noted. IMPRESSION: Soft tissue wound without underlying bony abnormality. Chronic changes as described above. Electronically Signed   By: Inez Catalina M.D.   On: 09/19/2018 18:45    Pending Labs [employer] (From admission, onward)    Start     Ordered   Signed and Held  CBC  (enoxaparin (LOVENOX)    CrCl >/= 30 ml/min)  Once,   R    Comments:  Baseline for enoxaparin therapy IF NOT ALREADY DRAWN.  Notify MD if PLT < 100 K.    Signed and Held   Signed and Held  Creatinine, serum  (enoxaparin (LOVENOX)    CrCl >/= 30 ml/min)  Once,   R    Comments:  Baseline for enoxaparin therapy IF NOT ALREADY DRAWN.    Signed and Held   Signed and Held  Creatinine, serum  (enoxaparin (LOVENOX)    CrCl >/= 30 ml/min)  Weekly,   R    Comments:  while on enoxaparin therapy    Signed and Held   Signed and Held  CBC  Tomorrow morning,   R     Signed and Held   Signed and Held  Basic metabolic panel  Tomorrow morning,   R     Signed and Held          Vitals/Pain Today's Vitals   09/19/18 1721 09/19/18 1941 09/19/18 1942 09/19/18 2000  BP: 135/87 (!) 138/100  (!) 167/101  Pulse: (!) 115  (!) 102 100  Resp: 18   18  Temp: 98.2 F (36.8 C)     TempSrc: Oral     SpO2: 99%  99% 100%  Weight: 54.4 kg  Height: 5\' 6"  (1.676 m)     PainSc: 10-Worst pain ever       Isolation Precautions No active isolations  Medications Medications  sodium chloride flush (NS) 0.9 % injection 3 mL (has no administration in time range)  morphine 4 MG/ML injection 4 mg (has no administration in time range)  clindamycin (CLEOCIN) IVPB 600 mg (has no administration in time range)  insulin aspart (novoLOG) injection 0-20 Units (has no administration in time range)  insulin aspart (novoLOG) injection  0-5 Units (has no administration in time range)  insulin aspart (novoLOG) injection 6 Units (has no administration in time range)  heparin injection 5,000 Units (has no administration in time range)  sodium chloride 0.9 % bolus 1,000 mL (0 mLs Intravenous Stopped 09/19/18 1933)  insulin aspart (novoLOG) injection 10 Units (10 Units Subcutaneous Given 09/19/18 1830)  morphine 4 MG/ML injection 4 mg (4 mg Intravenous Given 09/19/18 1831)  ondansetron (ZOFRAN) injection 4 mg (4 mg Intravenous Given 09/19/18 1831)    Mobility walks Low fall risk   Focused Assessments    R Recommendations: See Admitting Provider Note  Report given to:   Additional Notes:

## 2018-09-20 ENCOUNTER — Inpatient Hospital Stay: Payer: Medicaid Other | Admitting: Anesthesiology

## 2018-09-20 ENCOUNTER — Encounter: Payer: Self-pay | Admitting: Anesthesiology

## 2018-09-20 ENCOUNTER — Encounter
Admission: EM | Disposition: A | Discharge status: Home Health | Payer: Self-pay | Source: Home / Self Care | Attending: Internal Medicine

## 2018-09-20 DIAGNOSIS — L8990 Pressure ulcer of unspecified site, unspecified stage: Secondary | ICD-10-CM

## 2018-09-20 HISTORY — PX: IRRIGATION AND DEBRIDEMENT FOOT: SHX6602

## 2018-09-20 LAB — CBC
HCT: 20.5 % — ABNORMAL LOW (ref 39.0–52.0)
HCT: 25.8 % — ABNORMAL LOW (ref 39.0–52.0)
Hemoglobin: 6.8 g/dL — ABNORMAL LOW (ref 13.0–17.0)
Hemoglobin: 8.7 g/dL — ABNORMAL LOW (ref 13.0–17.0)
MCH: 26.7 pg (ref 26.0–34.0)
MCH: 27 pg (ref 26.0–34.0)
MCHC: 33.2 g/dL (ref 30.0–36.0)
MCHC: 33.7 g/dL (ref 30.0–36.0)
MCV: 80.4 fL (ref 80.0–100.0)
MCV: 80.1 fL (ref 80.0–100.0)
Platelets: 423 10*3/uL — ABNORMAL HIGH (ref 150–400)
Platelets: 537 10*3/uL — ABNORMAL HIGH (ref 150–400)
RBC: 2.55 MIL/uL — ABNORMAL LOW (ref 4.22–5.81)
RBC: 3.22 MIL/uL — ABNORMAL LOW (ref 4.22–5.81)
RDW: 12.8 % (ref 11.5–15.5)
RDW: 12.7 % (ref 11.5–15.5)
WBC: 11.2 10*3/uL — ABNORMAL HIGH (ref 4.0–10.5)
WBC: 13.3 10*3/uL — ABNORMAL HIGH (ref 4.0–10.5)
nRBC: 0 % (ref 0.0–0.2)
nRBC: 0 % (ref 0.0–0.2)

## 2018-09-20 LAB — GLUCOSE, CAPILLARY
GLUCOSE-CAPILLARY: 166 mg/dL — AB (ref 70–99)
Glucose-Capillary: 66 mg/dL — ABNORMAL LOW (ref 70–99)
Glucose-Capillary: 150 mg/dL — ABNORMAL HIGH (ref 70–99)
Glucose-Capillary: 131 mg/dL — ABNORMAL HIGH (ref 70–99)
Glucose-Capillary: 70 mg/dL (ref 70–99)
Glucose-Capillary: 108 mg/dL — ABNORMAL HIGH (ref 70–99)
Glucose-Capillary: 65 mg/dL — ABNORMAL LOW (ref 70–99)

## 2018-09-20 LAB — MRSA PCR SCREENING: MRSA by PCR: NEGATIVE

## 2018-09-20 LAB — BASIC METABOLIC PANEL
Anion gap: 12 (ref 5–15)
BUN: 30 mg/dL — ABNORMAL HIGH (ref 6–20)
CO2: 24 mmol/L (ref 22–32)
Calcium: 8.2 mg/dL — ABNORMAL LOW (ref 8.9–10.3)
Chloride: 100 mmol/L (ref 98–111)
Creatinine, Ser: 1.67 mg/dL — ABNORMAL HIGH (ref 0.61–1.24)
GFR calc Af Amer: >60 mL/min (ref >60)
GFR calc non Af Amer: 54 mL/min — ABNORMAL LOW (ref >60)
Glucose, Bld: 70 mg/dL (ref 70–99)
Potassium: 2.9 mmol/L — ABNORMAL LOW (ref 3.5–5.1)
Sodium: 136 mmol/L (ref 135–145)

## 2018-09-20 LAB — HEMOGLOBIN A1C
Hgb A1c MFr Bld: 13.7 % — ABNORMAL HIGH (ref 4.8–5.6)
Mean Plasma Glucose: 346.49 mg/dL

## 2018-09-20 LAB — RETICULOCYTES
Immature Retic Fract: 11.6 % (ref 2.3–15.9)
RBC.: 3.22 MIL/uL — ABNORMAL LOW (ref 4.22–5.81)
Retic Count, Absolute: 72.1 10*3/uL (ref 19.0–186.0)
Retic Ct Pct: 2.2 % (ref 0.4–3.1)

## 2018-09-20 LAB — IRON AND TIBC
Iron: 26 ug/dL — ABNORMAL LOW (ref 45–182)
Saturation Ratios: 11 % — ABNORMAL LOW (ref 17.9–39.5)
TIBC: 231 ug/dL — ABNORMAL LOW (ref 250–450)
UIBC: 205 ug/dL

## 2018-09-20 LAB — VITAMIN B12: Vitamin B-12: 554 pg/mL (ref 180–914)

## 2018-09-20 LAB — FERRITIN: Ferritin: 382 ng/mL — ABNORMAL HIGH (ref 24–336)

## 2018-09-20 LAB — FOLATE: Folate: 9.2 ng/mL (ref >5.9)

## 2018-09-20 LAB — POTASSIUM: Potassium: 3.6 mmol/L (ref 3.5–5.1)

## 2018-09-20 SURGERY — IRRIGATION AND DEBRIDEMENT FOOT
Anesthesia: General | Site: Foot | Laterality: Right

## 2018-09-20 MED ORDER — INSULIN GLARGINE 100 UNIT/ML ~~LOC~~ SOLN
15.0000 [IU] | Freq: Two times a day (BID) | SUBCUTANEOUS | Status: DC
  Administered 2018-09-20 – 2018-09-21 (×2): 15 [IU] via SUBCUTANEOUS
  Filled 2018-09-20 (×4): qty 0.15

## 2018-09-20 MED ORDER — SUCCINYLCHOLINE CHLORIDE 20 MG/ML IJ SOLN
INTRAMUSCULAR | Status: AC
  Filled 2018-09-20: qty 1

## 2018-09-20 MED ORDER — MIDAZOLAM HCL 5 MG/5ML IJ SOLN
INTRAMUSCULAR | Status: DC | PRN
  Administered 2018-09-20: 1 mg via INTRAVENOUS

## 2018-09-20 MED ORDER — ROCURONIUM BROMIDE 100 MG/10ML IV SOLN
INTRAVENOUS | Status: DC | PRN
  Administered 2018-09-20: 5 mg via INTRAVENOUS

## 2018-09-20 MED ORDER — DEXTROSE 50 % IV SOLN
12.5000 g | Freq: Once | INTRAVENOUS | Status: AC
  Administered 2018-09-20: 12.5 g via INTRAVENOUS

## 2018-09-20 MED ORDER — SUCCINYLCHOLINE CHLORIDE 20 MG/ML IJ SOLN
INTRAMUSCULAR | Status: DC | PRN
  Administered 2018-09-20: 100 mg via INTRAVENOUS

## 2018-09-20 MED ORDER — INSULIN GLARGINE 100 UNIT/ML ~~LOC~~ SOLN
17.0000 [IU] | Freq: Two times a day (BID) | SUBCUTANEOUS | Status: DC
  Administered 2018-09-20: 17 [IU] via SUBCUTANEOUS
  Filled 2018-09-20 (×2): qty 0.17

## 2018-09-20 MED ORDER — THROMBIN 5000 UNITS EX SOLR
CUTANEOUS | Status: AC
  Filled 2018-09-20: qty 5000

## 2018-09-20 MED ORDER — FENTANYL CITRATE (PF) 100 MCG/2ML IJ SOLN
INTRAMUSCULAR | Status: AC
  Administered 2018-09-20: 25 ug via INTRAVENOUS
  Filled 2018-09-20: qty 2

## 2018-09-20 MED ORDER — ONDANSETRON HCL 4 MG/2ML IJ SOLN
INTRAMUSCULAR | Status: AC
  Filled 2018-09-20: qty 2

## 2018-09-20 MED ORDER — INSULIN ASPART 100 UNIT/ML ~~LOC~~ SOLN
4.0000 [IU] | Freq: Three times a day (TID) | SUBCUTANEOUS | Status: DC
  Administered 2018-09-21 (×2): 4 [IU] via SUBCUTANEOUS
  Filled 2018-09-20 (×2): qty 1

## 2018-09-20 MED ORDER — PHENYLEPHRINE HCL 10 MG/ML IJ SOLN
INTRAMUSCULAR | Status: AC
  Filled 2018-09-20: qty 1

## 2018-09-20 MED ORDER — VANCOMYCIN HCL IN DEXTROSE 750-5 MG/150ML-% IV SOLN
750.0000 mg | INTRAVENOUS | Status: DC
  Administered 2018-09-20: 750 mg via INTRAVENOUS
  Filled 2018-09-20 (×2): qty 150

## 2018-09-20 MED ORDER — FENTANYL CITRATE (PF) 100 MCG/2ML IJ SOLN
INTRAMUSCULAR | Status: AC
  Filled 2018-09-20: qty 2

## 2018-09-20 MED ORDER — ONDANSETRON HCL 4 MG/2ML IJ SOLN
INTRAMUSCULAR | Status: DC | PRN
  Administered 2018-09-20: 4 mg via INTRAVENOUS

## 2018-09-20 MED ORDER — INSULIN ASPART 100 UNIT/ML ~~LOC~~ SOLN: 0.0000 [IU] | Freq: Every day | SUBCUTANEOUS | Status: DC

## 2018-09-20 MED ORDER — DEXTROSE 50 % IV SOLN
INTRAVENOUS | Status: AC
  Administered 2018-09-20: 12.5 g via INTRAVENOUS
  Filled 2018-09-20: qty 50

## 2018-09-20 MED ORDER — SODIUM CHLORIDE 0.9% IV SOLUTION: Freq: Once | INTRAVENOUS | Status: DC

## 2018-09-20 MED ORDER — PHENYLEPHRINE HCL 10 MG/ML IJ SOLN
INTRAMUSCULAR | Status: DC | PRN
  Administered 2018-09-20 (×2): 100 ug via INTRAVENOUS
  Administered 2018-09-20: 150 ug via INTRAVENOUS
  Administered 2018-09-20: 100 ug via INTRAVENOUS
  Administered 2018-09-20: 150 ug via INTRAVENOUS
  Administered 2018-09-20: 100 ug via INTRAVENOUS

## 2018-09-20 MED ORDER — ONDANSETRON HCL 4 MG/2ML IJ SOLN: 4.0000 mg | Freq: Once | INTRAMUSCULAR | Status: DC | PRN

## 2018-09-20 MED ORDER — POTASSIUM CHLORIDE 10 MEQ/100ML IV SOLN
10.0000 meq | INTRAVENOUS | Status: AC
  Administered 2018-09-20 (×4): 10 meq via INTRAVENOUS
  Filled 2018-09-20 (×4): qty 100

## 2018-09-20 MED ORDER — INSULIN ASPART 100 UNIT/ML ~~LOC~~ SOLN
0.0000 [IU] | Freq: Three times a day (TID) | SUBCUTANEOUS | Status: DC
  Administered 2018-09-21 (×2): 2 [IU] via SUBCUTANEOUS
  Filled 2018-09-20 (×2): qty 1

## 2018-09-20 MED ORDER — PIPERACILLIN-TAZOBACTAM 3.375 G IVPB
3.3750 g | Freq: Three times a day (TID) | INTRAVENOUS | Status: DC
  Administered 2018-09-20 – 2018-09-21 (×3): 3.375 g via INTRAVENOUS
  Filled 2018-09-20 (×3): qty 50

## 2018-09-20 MED ORDER — FENTANYL CITRATE (PF) 100 MCG/2ML IJ SOLN
25.0000 ug | INTRAMUSCULAR | Status: DC | PRN
  Administered 2018-09-20 (×4): 25 ug via INTRAVENOUS

## 2018-09-20 MED ORDER — PROPOFOL 10 MG/ML IV BOLUS
INTRAVENOUS | Status: DC | PRN
  Administered 2018-09-20: 120 mg via INTRAVENOUS

## 2018-09-20 MED ORDER — MIDAZOLAM HCL 2 MG/2ML IJ SOLN
INTRAMUSCULAR | Status: AC
  Filled 2018-09-20: qty 2

## 2018-09-20 MED ORDER — ROCURONIUM BROMIDE 50 MG/5ML IV SOLN
INTRAVENOUS | Status: AC
  Filled 2018-09-20: qty 1

## 2018-09-20 MED ORDER — FENTANYL CITRATE (PF) 100 MCG/2ML IJ SOLN
INTRAMUSCULAR | Status: DC | PRN
  Administered 2018-09-20: 50 ug via INTRAVENOUS

## 2018-09-20 MED ORDER — LIDOCAINE HCL (CARDIAC) PF 100 MG/5ML IV SOSY
PREFILLED_SYRINGE | INTRAVENOUS | Status: DC | PRN
  Administered 2018-09-20: 60 mg via INTRAVENOUS

## 2018-09-20 SURGICAL SUPPLY — 51 items
"PENCIL ELECTRO HAND CTR " (MISCELLANEOUS) ×1 IMPLANT
BANDAGE ELASTIC 4 LF NS (GAUZE/BANDAGES/DRESSINGS) ×3 IMPLANT
BLADE OSCILLATING/SAGITTAL (BLADE)
BLADE SURG 15 STRL LF DISP TIS (BLADE) ×1 IMPLANT
BLADE SURG 15 STRL SS (BLADE) ×2
BLADE SW THK.38XMED LNG THN (BLADE) IMPLANT
BNDG CONFORM 2 STRL LF (GAUZE/BANDAGES/DRESSINGS) ×3 IMPLANT
BNDG ESMARK 4X12 TAN STRL LF (GAUZE/BANDAGES/DRESSINGS) ×3 IMPLANT
BNDG GAUZE 4.5X4.1 6PLY STRL (MISCELLANEOUS) ×3 IMPLANT
CANISTER SUCT 1200ML W/VALVE (MISCELLANEOUS) ×3 IMPLANT
COVER WAND RF STERILE (DRAPES) ×3 IMPLANT
CUFF TOURN 18 STER (MISCELLANEOUS) ×3 IMPLANT
CUFF TOURN DUAL PL 12 NO SLV (MISCELLANEOUS) ×1 IMPLANT
DRAPE FLUOR MINI C-ARM 54X84 (DRAPES) IMPLANT
DURAPREP 26ML APPLICATOR (WOUND CARE) ×3 IMPLANT
ELECT REM PT RETURN 9FT ADLT (ELECTROSURGICAL) ×3
ELECTRODE REM PT RTRN 9FT ADLT (ELECTROSURGICAL) ×1 IMPLANT
GAUZE PETRO XEROFOAM 1X8 (MISCELLANEOUS) ×3 IMPLANT
GAUZE SPONGE 4X4 12PLY STRL (GAUZE/BANDAGES/DRESSINGS) ×3 IMPLANT
GLOVE BIO SURGEON STRL SZ7.5 (GLOVE) ×3 IMPLANT
GLOVE INDICATOR 8.0 STRL GRN (GLOVE) ×3 IMPLANT
GOWN STRL REUS W/ TWL LRG LVL3 (GOWN DISPOSABLE) ×2 IMPLANT
GOWN STRL REUS W/TWL LRG LVL3 (GOWN DISPOSABLE) ×4
HANDPIECE VERSAJET DEBRIDEMENT (MISCELLANEOUS) ×3 IMPLANT
KIT STIMULAN RAPID CURE 5CC (Orthopedic Implant) ×2 IMPLANT
LABEL OR SOLS (LABEL) ×3 IMPLANT
NDL FILTER BLUNT 18X1 1/2 (NEEDLE) ×1 IMPLANT
NDL HYPO 25X1 1.5 SAFETY (NEEDLE) ×3 IMPLANT
NEEDLE FILTER BLUNT 18X 1/2SAF (NEEDLE) ×2
NEEDLE FILTER BLUNT 18X1 1/2 (NEEDLE) ×1 IMPLANT
NEEDLE HYPO 25X1 1.5 SAFETY (NEEDLE) ×9 IMPLANT
NS IRRIG 500ML POUR BTL (IV SOLUTION) ×3 IMPLANT
PACK EXTREMITY ARMC (MISCELLANEOUS) ×3 IMPLANT
PAD ABD DERMACEA PRESS 5X9 (GAUZE/BANDAGES/DRESSINGS) ×6 IMPLANT
PENCIL ELECTRO HAND CTR (MISCELLANEOUS) ×3 IMPLANT
RASP SM TEAR CROSS CUT (RASP) IMPLANT
SOL PREP PVP 2OZ (MISCELLANEOUS) ×3
SOLUTION PREP PVP 2OZ (MISCELLANEOUS) ×1 IMPLANT
STOCKINETTE 48X4 2 PLY STRL (GAUZE/BANDAGES/DRESSINGS) ×1 IMPLANT
STOCKINETTE STRL 4IN 9604848 (GAUZE/BANDAGES/DRESSINGS) ×3 IMPLANT
STOCKINETTE STRL 6IN 960660 (GAUZE/BANDAGES/DRESSINGS) ×3 IMPLANT
SUT ETHILON 3-0 FS-10 30 BLK (SUTURE) ×3
SUT ETHILON 4-0 (SUTURE) ×2
SUT ETHILON 4-0 FS2 18XMFL BLK (SUTURE) ×1
SUT VIC AB 3-0 SH 27 (SUTURE) ×2
SUT VIC AB 3-0 SH 27X BRD (SUTURE) ×1 IMPLANT
SUT VIC AB 4-0 FS2 27 (SUTURE) ×3 IMPLANT
SUTURE EHLN 3-0 FS-10 30 BLK (SUTURE) ×1 IMPLANT
SUTURE ETHLN 4-0 FS2 18XMF BLK (SUTURE) ×1 IMPLANT
SYR 10ML LL (SYRINGE) ×6 IMPLANT
SYR 3ML LL SCALE MARK (SYRINGE) ×3 IMPLANT

## 2018-09-20 NOTE — Progress Notes (Signed)
[facility] [place] NAME: Doctor Sheahan    MR#:  [id number]  DATE OF BIRTH:  03/09/1986  SUBJECTIVE:   Patient here with elevated blood sugars and foot infection Going for debridement this afternoon  REVIEW OF SYSTEMS:    Review of Systems  Constitutional: ++ for fever, chillsno  weight loss HENT: Negative for ear pain, nosebleeds, congestion, facial swelling, rhinorrhea, neck pain, neck stiffness and ear discharge.   Respiratory: Negative for cough, shortness of breath, wheezing  Cardiovascular: Negative for chest pain, palpitations and leg swelling.  Gastrointestinal: Negative for heartburn, abdominal pain, vomiting, diarrhea or consitpation Genitourinary: Negative for dysuria, urgency, frequency, hematuria Musculoskeletal: Negative for back pain or joint pain Neurological: Negative for dizziness, seizures, syncope, focal weakness,  numbness and headaches.  Hematological: Does not bruise/bleed easily.  Psychiatric/Behavioral: Negative for hallucinations, confusion, dysphoric mood  SKIN necrotic appearing diabetic foot ulcer at the back of the heel  Tolerating Diet: yes      DRUG ALLERGIES:   Allergies  Allergen Reactions  . Banana Hives, Nausea And Vomiting and Rash  . Keflex [Cephalexin] Rash    No swelling- also taken penicillin without any issue.  . Sulfa Antibiotics Anaphylaxis  . Grapeseed Extract [Nutritional Supplements] Itching  . Shellfish Allergy Hives    "ALL SEAFOOD"  . Grape Seed Rash    VITALS:  Blood pressure 125/89, pulse 98, temperature 99 F (37.2 C), temperature source Oral, resp. rate 17, height 5\' 6"  (1.676 m), weight 54.4 kg, SpO2 100 %.  PHYSICAL EXAMINATION:  Constitutional: Appears thin no distress. HENT: Normocephalic. Marland Kitchen Oropharynx is clear and moist.  Eyes: Conjunctivae and EOM are normal. PERRLA, no scleral icterus.  Neck: Normal ROM. Neck supple. No JVD. No tracheal deviation. CVS: RRR,  S1/S2 +, no murmurs, no gallops, no carotid bruit.  Pulmonary: Effort and breath sounds normal, no stridor, rhonchi, wheezes, rales.  Abdominal: Soft. BS +,  no distension, tenderness, rebound or guarding.  Musculoskeletal: Normal range of motion. No edema and no tenderness.  Neuro: Alert. CN 2-12 grossly intact. No focal deficits. Skin: Amputation of fourth and fifth toes Large dry eschar on the bottom of the right heel no active drainage Mild erythema surrounding this Psychiatric: Normal mood and affect.      LABORATORY PANEL:   CBC Recent Labs  Lab 09/20/18 0705  WBC 13.3*  HGB 8.7*  HCT 25.8*  PLT 537*   ------------------------------------------------------------------------------------------------------------------  Chemistries  Recent Labs  Lab 09/19/18 1723 09/20/18 0538  NA 124* 136  K 3.6 2.9*  CL 90* 100  CO2 22 24  GLUCOSE 720* 70  BUN 34* 30*  CREATININE 1.94* 1.67*  CALCIUM 8.1* 8.2*  AST 13*  --   ALT 9  --   ALKPHOS 118  --   BILITOT 0.5  --    ------------------------------------------------------------------------------------------------------------------  Cardiac Enzymes No results for input(s): TROPONINI in the last 168 hours. 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 The proximal aspect of the fourth metatarsal remains. Changes of prior fracture in the third metatarsal are noted with some callus formation although overall nonunion. No new fracture is seen. There is soft tissue air identified inferiorly consistent with diabetic foot ulcer. No underlying bony erosion is noted. IMPRESSION: Soft tissue wound without  underlying bony  abnormality. Chronic changes as described above. Electronically Signed   By: Inez Catalina M.D.   On: 09/19/2018 18:45     ASSESSMENT AND PLAN:   33 year old male with history of chronic kidney disease stage III and diabetes who presents to the emergency room due to nausea and vomiting with elevated blood sugars and diabetic foot ulcer.  1.  Diabetic foot ulcer with underlying gas on x-ray: Patient is planned for debridement today by podiatry. Nonweightbearing on right lower extremity. Continue vancomycin and Zosyn  2.  Uncontrolled diabetes  Diabetes nurse consultation placed Continue Lantus with sliding scale and ADA diet   3.  Hypokalemia: Pharmacy consultation requested for replacement  4.  Acute on chronic anemia: This morning's hemoglobin is 6.8 was inaccurate. Hemoglobin is stable. Iron panel consistent with iron deficiency anemia in combination with chronic anemia Continue ferrous sulfate  5.  Essential hypertension: Continue lisinopril  Management plans discussed with the patient and he is in agreement.  CODE STATUS: full  TOTAL TIME TAKING CARE OF THIS PATIENT: 30 minutes.     POSSIBLE D/C 2 days, DEPENDING ON CLINICAL CONDITION.   Bettey Costa M.D on 09/20/2018 at 11:56 AM  Between 7am to 6pm - Pager - [phone] After 6pm go to [email or web address] - password [email or web address] [facility]  Office  [phone]  CC: Primary care physician; Valera Castle, MD  Note: This dictation was prepared with Dragon dictation along with smaller phrase technology. Any transcriptional errors that result from this process are unintentional.

## 2018-09-20 NOTE — Anesthesia Preprocedure Evaluation (Addendum)
Anesthesia Evaluation  Patient identified by MRN, date of birth, ID band Patient awake    Reviewed: Allergy & Precautions, NPO status , Patient's Chart, lab work & pertinent test results  History of Anesthesia Complications Negative for: history of anesthetic complications  Airway Mallampati: II       Dental  (+) Missing, Loose, Chipped, Poor Dentition   Pulmonary neg sleep apnea, neg COPD,           Cardiovascular hypertension, Pt. on medications (-) Past MI and (-) CHF (-) dysrhythmias (-) Valvular Problems/Murmurs     Neuro/Psych neg Seizures PSYCHIATRIC DISORDERS Depression  Neuromuscular disease    GI/Hepatic Neg liver ROS, GERD  Medicated and Poorly Controlled,  Endo/Other  diabetes, Type 1, Insulin Dependent  Renal/GU Renal InsufficiencyRenal disease     Musculoskeletal   Abdominal   Peds  Hematology  (+) anemia ,   Anesthesia Other Findings   Reproductive/Obstetrics                             Anesthesia Physical  Anesthesia Plan  ASA: III and emergent  Anesthesia Plan: General   Post-op Pain Management: GA combined w/ Regional for post-op pain   Induction: Rapid sequence and Cricoid pressure planned  PONV Risk Score and Plan: 2 and Dexamethasone and Ondansetron  Airway Management Planned: Oral ETT  Additional Equipment:   Intra-op Plan:   Post-operative Plan: Extubation in OR  Informed Consent: I have reviewed the patients History and Physical, chart, labs and discussed the procedure including the risks, benefits and alternatives for the proposed anesthesia with the patient or authorized representative who has indicated his/her understanding and acceptance.     Dental advisory given  Plan Discussed with: CRNA and Surgeon  Anesthesia Plan Comments:         Anesthesia Quick Evaluation

## 2018-09-20 NOTE — Anesthesia Postprocedure Evaluation (Signed)
Anesthesia Post Note  Patient: Shawn [name]  Procedure(s) Performed: IRRIGATION AND DEBRIDEMENT FOOT (Right Foot)  Patient location during evaluation: PACU Anesthesia Type: General Level of consciousness: awake and alert and oriented Pain management: pain level controlled Vital Signs Assessment: post-procedure vital signs reviewed and stable Respiratory status: spontaneous breathing Cardiovascular status: blood pressure returned to baseline Anesthetic complications: no     Last Vitals:  Vitals:   09/20/18 1901 09/20/18 1916  BP: (!) 133/94   Pulse: 91   Resp: 11   Temp:  36.9 C  SpO2: 100%     Last Pain:  Vitals:   09/20/18 1916  TempSrc:   PainSc: 4                  Courtny Bennison

## 2018-09-20 NOTE — Consult Note (Signed)
[place] for Electrolyte Monitoring and Replacement   Recent Labs: Potassium (mmol/L)  Date Value  09/20/2018 3.6  10/06/2014 3.4 (L)   Magnesium (mg/dL)  Date Value  07/17/2018 1.9   Calcium (mg/dL)  Date Value  09/20/2018 8.2 (L)   Calcium, Total (mg/dL)  Date Value  10/06/2014 10.0   Albumin (g/dL)  Date Value  09/19/2018 2.2 (L)  08/14/2018 3.6 (L)  10/06/2014 5.3 (H)   Phosphorus (mg/dL)  Date Value  11/11/2017 2.5   Sodium (mmol/L)  Date Value  09/20/2018 136  08/14/2018 128 (L)  10/06/2014 132 (L)   Corrected Ca: 9.64 mg/dL  Assessment: 33 y.o. male with severe hyperglycemia and an infected foot. The patient was started on antibiotics in the ED. Labs revealed hypokalemia this morning which was replaced with 40 mEq IV KCl  Plan:  --follow-up potassium level is wnl --no further potassium supplementation is required at his time --pharmacy will follow up again in the morning and replace as needed  Dallie Piles ,PharmD Clinical Pharmacist 09/20/2018 6:24 PM

## 2018-09-20 NOTE — Transfer of Care (Signed)
Immediate Anesthesia Transfer of Care Note  Patient: Shawn [name]  Procedure(s) Performed: IRRIGATION AND DEBRIDEMENT FOOT (Right Foot)  Patient Location: PACU  Anesthesia Type:General  Level of Consciousness: oriented and patient cooperative  Airway & Oxygen Therapy: Patient Spontanous Breathing and Patient connected to face mask oxygen  Post-op Assessment: Report given to RN and Post -op Vital signs reviewed and stable  Post vital signs: Reviewed and stable  Last Vitals:  Vitals Value Taken Time  BP 130/89 09/20/2018  6:32 PM  Temp 36.7 C 09/20/2018  6:32 PM  Pulse 89 09/20/2018  6:34 PM  Resp 12 09/20/2018  6:34 PM  SpO2 100 % 09/20/2018  6:34 PM  Vitals shown include unvalidated device data.  Last Pain:  Vitals:   09/20/18 1832  TempSrc:   PainSc: Asleep         Complications: No apparent anesthesia complications

## 2018-09-20 NOTE — Interval H&P Note (Signed)
History and Physical Interval Note:  09/20/2018 5:16 PM  Shawn [name]  has presented today for surgery, with the diagnosis of right foot infection.  The various methods of treatment have been discussed with the patient and family. After consideration of risks, benefits and other options for treatment, the patient has consented to  Procedure(s): IRRIGATION AND DEBRIDEMENT FOOT (Right) as a surgical intervention.  The patient's history has been reviewed, patient examined, no change in status, stable for surgery.  I have reviewed the patient's chart and labs.  Questions were answered to the patient's satisfaction.     [name]

## 2018-09-20 NOTE — Anesthesia Procedure Notes (Signed)
Procedure Name: Intubation Date/Time: 09/20/2018 5:32 PM Performed by: Dionne Bucy, CRNA Pre-anesthesia Checklist: Patient identified, Patient being monitored, Timeout performed, Emergency Drugs available and Suction available Patient Re-evaluated:Patient Re-evaluated prior to induction Oxygen Delivery Method: Circle system utilized Preoxygenation: Pre-oxygenation with 100% oxygen Induction Type: IV induction, Rapid sequence and Cricoid Pressure applied Ventilation: Mask ventilation without difficulty Laryngoscope Size: Mac and 4 Grade View: Grade II Tube type: Oral Tube size: 7.5 mm Number of attempts: 1 Airway Equipment and Method: Stylet Placement Confirmation: ETT inserted through vocal cords under direct vision,  positive ETCO2 and breath sounds checked- equal and bilateral Secured at: 22 cm Tube secured with: Tape Dental Injury: Teeth and Oropharynx as per pre-operative assessment

## 2018-09-20 NOTE — H&P (View-Only) (Signed)
Reason for Consult: Infection right foot Referring Physician: Satoru Milich is an 33 y.o. male.  HPI: This is a 33 year old male well-known to me and our practice with a history of bilateral amputations with ray resections due to his diabetes and vascular disease.  Recent development of an ulceration on his right heel.  Was seen outpatient but this continued to progress with increased redness and pain and was seen in the emergency department and admitted for treatment.  Past Medical History:  Diagnosis Date  . CKD (chronic kidney disease)   . Diabetes mellitus without complication ([place])   . GERD (gastroesophageal reflux disease)   . HTN (hypertension)   . IBS (irritable bowel syndrome)   . Osteomyelitis [facility])     Past Surgical History:  Procedure Laterality Date  . ABDOMINAL AORTOGRAM W/LOWER EXTREMITY Right 12/23/2017   Procedure: ABDOMINAL AORTOGRAM W/LOWER EXTREMITY;  Surgeon: Katha Cabal, MD;  Location: [place] [facility];  Service: Cardiovascular;  Laterality: Right;  . ACHILLES TENDON SURGERY Bilateral 06/16/2018   Procedure: ACHILLES LENGTHENING/KIDNER;  Surgeon: Albertine Patricia, DPM;  Location: [facility];  Service: Podiatry;  Laterality: Bilateral;  . AMPUTATION Right 12/24/2017   Procedure: AMPUTATION RAY;  Surgeon: Sharlotte Alamo, DPM;  Location: [facility];  Service: Podiatry;  Laterality: Right;  . AMPUTATION Left 04/06/2018   Procedure: AMPUTATION RAY;  Surgeon: Sharlotte Alamo, DPM;  Location: [facility];  Service: Podiatry;  Laterality: Left;  . AMPUTATION Left 04/09/2018   Procedure: AMPUTATION RAY;  Surgeon: Sharlotte Alamo, DPM;  Location: [facility];  Service: Podiatry;  Laterality: Left;  . APPLICATION OF WOUND VAC Right 12/24/2017   Procedure: APPLICATION OF WOUND VAC;  Surgeon: Sharlotte Alamo, DPM;  Location: [facility];  Service: Podiatry;  Laterality: Right;  . APPLICATION OF WOUND VAC Right 01/01/2018   Procedure: APPLICATION OF WOUND VAC;  Surgeon: Albertine Patricia,  DPM;  Location: [facility];  Service: Podiatry;  Laterality: Right;  . BONE EXCISION Bilateral 06/16/2018   Procedure: BONE EXCISION AND SOFT TISSUE;  Surgeon: Albertine Patricia, DPM;  Location: [facility];  Service: Podiatry;  Laterality: Bilateral;  . IRRIGATION AND DEBRIDEMENT FOOT Right 12/20/2017   Procedure: IRRIGATION AND DEBRIDEMENT FOOT;  Surgeon: Sharlotte Alamo, DPM;  Location: [facility];  Service: Podiatry;  Laterality: Right;  . IRRIGATION AND DEBRIDEMENT FOOT Right 12/24/2017   Procedure: IRRIGATION AND DEBRIDEMENT FOOT;  Surgeon: Sharlotte Alamo, DPM;  Location: [facility];  Service: Podiatry;  Laterality: Right;  . IRRIGATION AND DEBRIDEMENT FOOT Right 01/01/2018   Procedure: IRRIGATION AND DEBRIDEMENT FOOT-SKIN,SOFT TISSUE AND BONE;  Surgeon: Albertine Patricia, DPM;  Location: [facility];  Service: Podiatry;  Laterality: Right;  . IRRIGATION AND DEBRIDEMENT FOOT Left 04/06/2018   Procedure: IRRIGATION AND DEBRIDEMENT FOOT;  Surgeon: Sharlotte Alamo, DPM;  Location: [facility];  Service: Podiatry;  Laterality: Left;  . LOWER EXTREMITY ANGIOGRAPHY Left 04/06/2018   Procedure: Lower Extremity Angiography;  Surgeon: Algernon Huxley, MD;  Location: [place] [facility];  Service: Cardiovascular;  Laterality: Left;    Family History  Problem Relation Age of Onset  . Diabetes Mother   . Ovarian cancer Mother   . Healthy Father     Social History:  reports that he has never smoked. He has never used smokeless tobacco. He reports current drug use. Drugs: Marijuana and PCP. He reports that he does not drink alcohol.  Allergies:  Allergies  Allergen Reactions  . Banana Hives, Nausea And Vomiting and Rash  . Keflex [Cephalexin] Rash  No swelling- also taken penicillin without any issue.  . Sulfa Antibiotics Anaphylaxis  . Grapeseed Extract [Nutritional Supplements] Itching  . Shellfish Allergy Hives    "ALL SEAFOOD"  . Grape Seed Rash    Medications:  Scheduled: . sodium chloride   Intravenous Once  .  calcium carbonate  500 mg Oral BID WC  . cholecalciferol  125 mcg Oral Q breakfast  . ferrous sulfate  325 mg Oral BID WC  . gabapentin  300 mg Oral BID  . heparin injection (subcutaneous)  5,000 Units Subcutaneous Q8H  . insulin aspart  0-20 Units Subcutaneous TID WC  . insulin aspart  0-5 Units Subcutaneous QHS  . insulin aspart  6 Units Subcutaneous TID WC  . insulin glargine  17 Units Subcutaneous BID  . lisinopril  10 mg Oral Daily  . magnesium oxide  400 mg Oral BID AC & HS  . potassium chloride  10 mEq Oral Daily  . sodium chloride flush  3 mL Intravenous Once  . vitamin B-12  5,000 mcg Oral Daily  . ascorbic acid  250 mg Oral BID  . zolpidem  10 mg Oral QHS    Results for orders placed or performed during the hospital encounter of 09/19/18 (from the past 48 hour(s))  Lipase, blood     Status: Abnormal   Collection Time: 09/19/18  5:23 PM  Result Value Ref Range   Lipase 72 (H) 11 - 51 U/L    Comment: Performed at [facility], [address] [place], [place] [address]  Comprehensive metabolic panel     Status: Abnormal   Collection Time: 09/19/18  5:23 PM  Result Value Ref Range   Sodium 124 (L) 135 - 145 mmol/L   Potassium 3.6 3.5 - 5.1 mmol/L   Chloride 90 (L) 98 - 111 mmol/L   CO2 22 22 - 32 mmol/L   Glucose, Bld 720 (HH) 70 - 99 mg/dL    Comment: CRITICAL RESULT CALLED TO, READ BACK BY AND VERIFIED WITH JENNIFER WITHLEY @ 1830 ON 09/19/18 BY JUW    BUN 34 (H) 6 - 20 mg/dL   Creatinine, Ser 1.94 (H) 0.61 - 1.24 mg/dL   Calcium 8.1 (L) 8.9 - 10.3 mg/dL   Total Protein 6.2 (L) 6.5 - 8.1 g/dL   Albumin 2.2 (L) 3.5 - 5.0 g/dL   AST 13 (L) 15 - 41 U/L   ALT 9 0 - 44 U/L   Alkaline Phosphatase 118 38 - 126 U/L   Total Bilirubin 0.5 0.3 - 1.2 mg/dL   GFR calc non Af Amer 45 (L) >60 mL/min   GFR calc Af Amer 52 (L) >60 mL/min   Anion gap 12 5 - 15    Comment: Performed at [facility], [address] [place], [place] [address]  CBC     Status:  Abnormal   Collection Time: 09/19/18  5:23 PM  Result Value Ref Range   WBC 13.5 (H) 4.0 - 10.5 K/uL   RBC 3.28 (L) 4.22 - 5.81 MIL/uL   Hemoglobin 9.0 (L) 13.0 - 17.0 g/dL   HCT 25.6 (L) 39.0 - 52.0 %   MCV 78.0 (L) 80.0 - 100.0 fL   MCH 27.4 26.0 - 34.0 pg   MCHC 35.2 30.0 - 36.0 g/dL   RDW 12.5 11.5 - 15.5 %   Platelets 355 150 - 400 K/uL   nRBC 0.0 0.0 - 0.2 %    Comment: Performed at [facility], [address] [place], [place] [address]  Glucose, capillary     Status: Abnormal   Collection Time: 09/19/18  5:24 PM  Result Value Ref Range   Glucose-Capillary >600 (HH) 70 - 99 mg/dL   Comment 1 Notify RN    Comment 2 Document in Chart   Urinalysis, Complete w Microscopic     Status: Abnormal   Collection Time: 09/19/18  6:15 PM  Result Value Ref Range   Color, Urine STRAW (A) YELLOW   APPearance HAZY (A) CLEAR   Specific Gravity, Urine 1.020 1.005 - 1.030   pH 5.0 5.0 - 8.0   Glucose, UA >=500 (A) NEGATIVE mg/dL   Hgb urine dipstick SMALL (A) NEGATIVE   Bilirubin Urine NEGATIVE NEGATIVE   Ketones, ur NEGATIVE NEGATIVE mg/dL   Protein, ur 100 (A) NEGATIVE mg/dL   Nitrite NEGATIVE NEGATIVE   Leukocytes,Ua NEGATIVE NEGATIVE   RBC / HPF 6-10 0 - 5 RBC/hpf   WBC, UA 0-5 0 - 5 WBC/hpf   Bacteria, UA NONE SEEN NONE SEEN   Squamous Epithelial / LPF 0-5 0 - 5   Amorphous Crystal PRESENT     Comment: Performed at [facility], [address] [place], [place] [address]  Glucose, capillary     Status: Abnormal   Collection Time: 09/19/18  7:38 PM  Result Value Ref Range   Glucose-Capillary 536 (HH) 70 - 99 mg/dL   Comment 1 Document in Chart   Glucose, capillary     Status: Abnormal   Collection Time: 09/19/18  9:00 PM  Result Value Ref Range   Glucose-Capillary 376 (H) 70 - 99 mg/dL  CBC     Status: Abnormal   Collection Time: 09/20/18  5:38 AM  Result Value Ref Range   WBC 11.2 (H) 4.0 - 10.5 K/uL   RBC 2.55 (L) 4.22 - 5.81 MIL/uL   Hemoglobin 6.8  (L) 13.0 - 17.0 g/dL   HCT 20.5 (L) 39.0 - 52.0 %   MCV 80.4 80.0 - 100.0 fL   MCH 26.7 26.0 - 34.0 pg   MCHC 33.2 30.0 - 36.0 g/dL   RDW 12.8 11.5 - 15.5 %   Platelets 423 (H) 150 - 400 K/uL   nRBC 0.0 0.0 - 0.2 %    Comment: Performed at [facility], [address] [place], [place] [address]  Basic metabolic panel     Status: Abnormal   Collection Time: 09/20/18  5:38 AM  Result Value Ref Range   Sodium 136 135 - 145 mmol/L   Potassium 2.9 (L) 3.5 - 5.1 mmol/L   Chloride 100 98 - 111 mmol/L   CO2 24 22 - 32 mmol/L   Glucose, Bld 70 70 - 99 mg/dL   BUN 30 (H) 6 - 20 mg/dL   Creatinine, Ser 1.67 (H) 0.61 - 1.24 mg/dL   Calcium 8.2 (L) 8.9 - 10.3 mg/dL   GFR calc non Af Amer 54 (L) >60 mL/min   GFR calc Af Amer >60 >60 mL/min   Anion gap 12 5 - 15    Comment: Performed at [facility], [address] [place], [place] [address]  Prepare RBC     Status: None   Collection Time: 09/20/18  6:55 AM  Result Value Ref Range   Order Confirmation      ORDER PROCESSED BY BLOOD BANK Performed at [facility], [address] [place],  [address]   Type and screen [place]  Status: None (Preliminary result)   Collection Time: 09/20/18  7:21 AM  Result Value Ref Range   ABO/RH(D) A NEG    Antibody Screen NEG    Sample Expiration      09/23/2018 Performed at [facility], [address] [place], [place] [address]    Unit Number [id number]    Blood Component Type RBC LR PHER2    Unit division 00    Status of Unit ALLOCATED    Transfusion Status OK TO TRANSFUSE    Crossmatch Result Compatible   Reticulocytes     Status: Abnormal   Collection Time: 09/20/18  7:21 AM  Result Value Ref Range   Retic Ct Pct 2.2 0.4 - 3.1 %   RBC. 3.22 (L) 4.22 - 5.81 MIL/uL   Retic Count, Absolute 72.1 19.0 - 186.0 K/uL   Immature Retic Fract 11.6 2.3 - 15.9 %    Comment: Performed at [facility], [address] [place], [place] [address]  Glucose, capillary     Status: Abnormal   Collection Time: 09/20/18  7:33 AM  Result Value Ref Range   Glucose-Capillary 66 (L) 70 - 99 mg/dL  Type and screen     Status: None (Preliminary result)   Collection Time: 09/20/18  8:30 AM  Result Value Ref Range   ABO/RH(D) PENDING    Antibody Screen PENDING    Sample Expiration      09/23/2018 Performed at [facility], [address], [place], [place] [address]     Dg Foot 2 Views Right  Result Date: 09/19/2018 CLINICAL DATA:  Right foot pain EXAM: RIGHT FOOT - 2 VIEW COMPARISON:  07/03/2018 FINDINGS: Postsurgical changes are noted consistent with amputation of the fourth and fifth digits. The proximal aspect of the fourth metatarsal remains. Changes of prior fracture in the third metatarsal are noted with some callus formation although overall nonunion. No new fracture is seen. There is soft tissue air identified inferiorly consistent with diabetic foot ulcer. No underlying bony erosion is noted. IMPRESSION: Soft tissue wound without underlying bony abnormality. Chronic changes as described above. Electronically Signed   By: Inez Catalina M.D.   On: 09/19/2018 18:45    Review of Systems  Constitutional: Positive for chills and fever.  HENT: Negative for congestion and sinus pain.   Eyes: Negative for blurred vision and double vision.  Respiratory: Positive for shortness of breath. Negative for cough.   Cardiovascular: Negative for chest pain and palpitations.  Gastrointestinal: Positive for diarrhea and nausea.  Genitourinary: Negative for dysuria and frequency.  Musculoskeletal:       Recent increase of pain in his right foot and heel.  Skin:       Some increased redness with recent ulceration on his right heel  Neurological: Negative for tingling and sensory change.  Endo/Heme/Allergies: Does not bruise/bleed easily.  Psychiatric/Behavioral: Negative for depression. The patient is not  nervous/anxious.    Blood pressure 125/88, pulse (!) 104, temperature 98.3 F (36.8 C), temperature source Oral, resp. rate 17, height 5\' 6"  (1.676 m), weight 54.4 kg, SpO2 100 %. Physical Exam  Cardiovascular:  DP and PT pulses are fully palpable.  Musculoskeletal:     Comments: Previous amputation of the fourth and fifth toes and raise bilateral.  Neurological:  Sensation still appears to be grossly intact bilateral.  Skin:  The skin is warm dry and supple.  There is a large dry eschar on the bottom of the right heel at least  6 cm x 4 cm.  No active drainage.  There is some surrounding erythema and edema which does extend proximally from the heel.    Assessment/Plan: Assessment: 1.  Ulceration with underlying gas on x-ray. 2.  Diabetes with peripheral vascular disease.  Plan: Discussed with the patient we will need to surgically debride the ulceration on his right heel.  At this point I think we will plan for this afternoon after work.  Discussed possible risks and complications including inability to heal due to his diabetes or infection.  No guarantees could be given as to healing.  He will need to be nonweightbearing on his right lower extremity.  We will keep him n.p.o. after breakfast.  Consent form for debridement right foot.  Plan for surgery later today  [name] 09/20/2018, 8:13 AM

## 2018-09-20 NOTE — Progress Notes (Addendum)
Inpatient Diabetes Program Recommendations  AACE/ADA: New Consensus Statement on Inpatient Glycemic Control (2015)  Target Ranges:  Prepandial:   less than 140 mg/dL      Peak postprandial:   less than 180 mg/dL (1-2 hours)      Critically ill patients:  140 - 180 mg/dL   Lab Results  Component Value Date   GLUCAP 131 (H) 09/20/2018   HGBA1C 14.5 (H) 08/16/2018    Review of Glycemic Control Results for NOUR, SCALISE (MRN [id number]) as of 09/20/2018 13:10  Ref. Range 09/19/2018 19:38 09/19/2018 21:00 09/20/2018 07:33 09/20/2018 10:11 09/20/2018 11:31  Glucose-Capillary Latest Ref Range: 70 - 99 mg/dL 536 (HH) 376 (H) 66 (L) 150 (H) 131 (H)   Diabetes history: DM 1 per history Outpatient Diabetes medications:  Per d/c summary on 08/16/18, patient was supposed to be taking Lantus 30 units daily and Novolog 4 units tid with meals Current orders for Inpatient glycemic control:  Lantus 17 units bid, Novolog resistant tid with meals and HS and Novolog 6 units tid with meals  Inpatient Diabetes Program Recommendations:    May consider reducing Lantus to 15 units bid.  Also please reduce Novolog correction to sensitive tid with meals and HS. May also consider reducing Novolog meal coverage to 4 units tid with meals (hold if patient eats less than 50%). Needs to f/u with [name] regarding DM management as well.   -This is patient's 8th admit in past 6 months.  Will attempt to speak with patient today regarding possible barriers and educational needs related to DM.   Thanks,  Adah Perl, RN, BC-ADM Inpatient Diabetes Coordinator Pager [phone] (8a-5p)  Addendum 1400: Spoke with patient regarding home DM management.  He states that he takes his insulin and only forgets sometimes in the mornings.  He is only testing 2 times a day and reports blood sugars> 200 mg/dL each time.  I asked about his home DM medication regimen and he states that he is taking Lantus 35 units bid and Novolin  R 5 units with meals.  I discussed that his medications were changed after being in the hospital in February but patient did not seem to know.  We discussed changes and importance of glycemic and DM control.  Patient still does not seem to understand that blood sugar control is important to help his foot heal. He was referred to endocrinology in January of 2020 however per patient he missed his appointment and needs to reschedule.  I advised patient that currently he is only supposed to take Lantus once a day and that Novolin R has been changed to Novolog (which is more rapid acting).  I asked if he was "sleeping through his medications".  Patient states that he is unable to sleep at night and often sleeps during the day.   Recommendations: -Needs new appointment with Endocrinologist.  -Consider Home health RN at D/C -Need to make sure that patient knows what to take at d/c regarding his insulins.  Thanks,  Adah Perl, RN, BC-ADM Inpatient Diabetes Coordinator Pager [phone] (8a-5p)

## 2018-09-20 NOTE — Consult Note (Addendum)
Pharmacy Antibiotic Note  Shawn [name] is a 33 y.o. male admitted on 09/19/2018 with Foot Infection .  Pharmacy has been consulted for Vancomycin dosing.  Original Plan: Give Vancomycin 1250mg  x 1 dose, followed by vancomycin 750mg  IV Q 24 hours.  Goal AUC 400-550. Expected AUC: 479.2 SCr used: 1.94  Rechecked dosing with updated Scr of 1.67 Will continue  Vancomycin 750 mg IV Q 24 hrs. Goal AUC 400-550. Expected AUC: 419 SCr used: 1.67  Pharmacy will continue to monitor Scr daily for a few days to make sure this dose is still appropriate, but a CrCl < 76ml/min seems to be the patient baseline.  No dosing adjustments at this time.  Will Start Zosyn 3.375mg  q8hr (extended interval dosing)  Height: 5\' 6"  (167.6 cm) Weight: 120 lb (54.4 kg) IBW/kg (Calculated) : 63.8  Temp (24hrs), Avg:98.5 F (36.9 C), Min:98.2 F (36.8 C), Max:99.1 F (37.3 C)  Recent Labs  Lab 09/19/18 1723 09/20/18 0538  WBC 13.5* 11.2*  CREATININE 1.94* 1.67*    Estimated Creatinine Clearance: 49.8 mL/min (A) (by C-G formula based on SCr of 1.67 mg/dL (H)).    Allergies  Allergen Reactions  . Banana Hives, Nausea And Vomiting and Rash  . Keflex [Cephalexin] Rash    No swelling- also taken penicillin without any issue.  . Sulfa Antibiotics Anaphylaxis  . Grapeseed Extract [Nutritional Supplements] Itching  . Shellfish Allergy Hives    "ALL SEAFOOD"  . Grape Seed Rash   Antimicrobials this admission: 3/17 Vancomycin  >>   Thank you for allowing pharmacy to be a part of this patient's care.  Lu Duffel, PharmD, BCPS Clinical Pharmacist 09/20/2018 8:12 AM

## 2018-09-20 NOTE — Consult Note (Addendum)
PHARMACY CONSULT NOTE - FOLLOW UP  Pharmacy Consult for Electrolyte Monitoring and Replacement   Recent Labs: Potassium level (09/20/2018 @ 0530)  2.9  Assessment: Potassium level indicates that the patient is hypokalemic and needs potassium replenishment.   Goal of Therapy:  Goal is for potassium to be WNLs.   Plan:  Will start KCL IV 40 meq x1 and repeat potassium level with PM labs today and AM labs tomorrow.   Loretha Stapler Clinical Pharmacist 09/20/2018 [phone]

## 2018-09-20 NOTE — Consult Note (Signed)
Reason for Consult: Infection right foot Referring Physician: [name] is an 33 y.o. male.  HPI: This is a 33 year old male well-known to me and our practice with a history of bilateral amputations with ray resections due to his diabetes and vascular disease.  Recent development of an ulceration on his right heel.  Was seen outpatient but this continued to progress with increased redness and pain and was seen in the emergency department and admitted for treatment.  Past Medical History:  Diagnosis Date  . CKD (chronic kidney disease)   . Diabetes mellitus without complication ([place])   . GERD (gastroesophageal reflux disease)   . HTN (hypertension)   . IBS (irritable bowel syndrome)   . Osteomyelitis [facility])     Past Surgical History:  Procedure Laterality Date  . ABDOMINAL AORTOGRAM W/LOWER EXTREMITY Right 12/23/2017   Procedure: ABDOMINAL AORTOGRAM W/LOWER EXTREMITY;  Surgeon: Katha Cabal, MD;  Location: [facility];  Service: Cardiovascular;  Laterality: Right;  . ACHILLES TENDON SURGERY Bilateral 06/16/2018   Procedure: ACHILLES LENGTHENING/KIDNER;  Surgeon: Albertine Patricia, DPM;  Location: [facility];  Service: Podiatry;  Laterality: Bilateral;  . AMPUTATION Right 12/24/2017   Procedure: AMPUTATION RAY;  Surgeon: Sharlotte Alamo, DPM;  Location: [facility];  Service: Podiatry;  Laterality: Right;  . AMPUTATION Left 04/06/2018   Procedure: AMPUTATION RAY;  Surgeon: Sharlotte Alamo, DPM;  Location: [facility];  Service: Podiatry;  Laterality: Left;  . AMPUTATION Left 04/09/2018   Procedure: AMPUTATION RAY;  Surgeon: Sharlotte Alamo, DPM;  Location: [facility];  Service: Podiatry;  Laterality: Left;  . APPLICATION OF WOUND VAC Right 12/24/2017   Procedure: APPLICATION OF WOUND VAC;  Surgeon: Sharlotte Alamo, DPM;  Location: [facility];  Service: Podiatry;  Laterality: Right;  . APPLICATION OF WOUND VAC Right 01/01/2018   Procedure: APPLICATION OF WOUND VAC;  Surgeon: Albertine Patricia,  DPM;  Location: [facility];  Service: Podiatry;  Laterality: Right;  . BONE EXCISION Bilateral 06/16/2018   Procedure: BONE EXCISION AND SOFT TISSUE;  Surgeon: Albertine Patricia, DPM;  Location: [facility];  Service: Podiatry;  Laterality: Bilateral;  . IRRIGATION AND DEBRIDEMENT FOOT Right 12/20/2017   Procedure: IRRIGATION AND DEBRIDEMENT FOOT;  Surgeon: Sharlotte Alamo, DPM;  Location: [facility];  Service: Podiatry;  Laterality: Right;  . IRRIGATION AND DEBRIDEMENT FOOT Right 12/24/2017   Procedure: IRRIGATION AND DEBRIDEMENT FOOT;  Surgeon: Sharlotte Alamo, DPM;  Location: [facility];  Service: Podiatry;  Laterality: Right;  . IRRIGATION AND DEBRIDEMENT FOOT Right 01/01/2018   Procedure: IRRIGATION AND DEBRIDEMENT FOOT-SKIN,SOFT TISSUE AND BONE;  Surgeon: Albertine Patricia, DPM;  Location: [facility];  Service: Podiatry;  Laterality: Right;  . IRRIGATION AND DEBRIDEMENT FOOT Left 04/06/2018   Procedure: IRRIGATION AND DEBRIDEMENT FOOT;  Surgeon: Sharlotte Alamo, DPM;  Location: [facility];  Service: Podiatry;  Laterality: Left;  . LOWER EXTREMITY ANGIOGRAPHY Left 04/06/2018   Procedure: Lower Extremity Angiography;  Surgeon: Algernon Huxley, MD;  Location: [facility];  Service: Cardiovascular;  Laterality: Left;    Family History  Problem Relation Age of Onset  . Diabetes Mother   . Ovarian cancer Mother   . Healthy Father     Social History:  reports that he has never smoked. He has never used smokeless tobacco. He reports current drug use. Drugs: Marijuana and PCP. He reports that he does not drink alcohol.  Allergies:  Allergies  Allergen Reactions  . Banana Hives, Nausea And Vomiting and Rash  . Keflex [Cephalexin] Rash  No swelling- also taken penicillin without any issue.  . Sulfa Antibiotics Anaphylaxis  . Grapeseed Extract [Nutritional Supplements] Itching  . Shellfish Allergy Hives    "ALL SEAFOOD"  . Grape Seed Rash    Medications:  Scheduled: . sodium chloride   Intravenous Once  .  calcium carbonate  500 mg Oral BID WC  . cholecalciferol  125 mcg Oral Q breakfast  . ferrous sulfate  325 mg Oral BID WC  . gabapentin  300 mg Oral BID  . heparin injection (subcutaneous)  5,000 Units Subcutaneous Q8H  . insulin aspart  0-20 Units Subcutaneous TID WC  . insulin aspart  0-5 Units Subcutaneous QHS  . insulin aspart  6 Units Subcutaneous TID WC  . insulin glargine  17 Units Subcutaneous BID  . lisinopril  10 mg Oral Daily  . magnesium oxide  400 mg Oral BID AC & HS  . potassium chloride  10 mEq Oral Daily  . sodium chloride flush  3 mL Intravenous Once  . vitamin B-12  5,000 mcg Oral Daily  . ascorbic acid  250 mg Oral BID  . zolpidem  10 mg Oral QHS    Results for orders placed or performed during the hospital encounter of 09/19/18 (from the past 48 hour(s))  Lipase, blood     Status: Abnormal   Collection Time: 09/19/18  5:23 PM  Result Value Ref Range   Lipase 72 (H) 11 - 51 U/L    Comment: Performed at [facility], [address] [place], [place] [address]  Comprehensive metabolic panel     Status: Abnormal   Collection Time: 09/19/18  5:23 PM  Result Value Ref Range   Sodium 124 (L) 135 - 145 mmol/L   Potassium 3.6 3.5 - 5.1 mmol/L   Chloride 90 (L) 98 - 111 mmol/L   CO2 22 22 - 32 mmol/L   Glucose, Bld 720 (HH) 70 - 99 mg/dL    Comment: CRITICAL RESULT CALLED TO, READ BACK BY AND VERIFIED WITH JENNIFER WITHLEY @ 1830 ON 09/19/18 BY JUW    BUN 34 (H) 6 - 20 mg/dL   Creatinine, Ser 1.94 (H) 0.61 - 1.24 mg/dL   Calcium 8.1 (L) 8.9 - 10.3 mg/dL   Total Protein 6.2 (L) 6.5 - 8.1 g/dL   Albumin 2.2 (L) 3.5 - 5.0 g/dL   AST 13 (L) 15 - 41 U/L   ALT 9 0 - 44 U/L   Alkaline Phosphatase 118 38 - 126 U/L   Total Bilirubin 0.5 0.3 - 1.2 mg/dL   GFR calc non Af Amer 45 (L) >60 mL/min   GFR calc Af Amer 52 (L) >60 mL/min   Anion gap 12 5 - 15    Comment: Performed at [facility], [address] [place], [place] [address]  CBC     Status:  Abnormal   Collection Time: 09/19/18  5:23 PM  Result Value Ref Range   WBC 13.5 (H) 4.0 - 10.5 K/uL   RBC 3.28 (L) 4.22 - 5.81 MIL/uL   Hemoglobin 9.0 (L) 13.0 - 17.0 g/dL   HCT 25.6 (L) 39.0 - 52.0 %   MCV 78.0 (L) 80.0 - 100.0 fL   MCH 27.4 26.0 - 34.0 pg   MCHC 35.2 30.0 - 36.0 g/dL   RDW 12.5 11.5 - 15.5 %   Platelets 355 150 - 400 K/uL   nRBC 0.0 0.0 - 0.2 %    Comment: Performed at [facility], [address] [place], [place] [address]  Glucose, capillary     Status: Abnormal   Collection Time: 09/19/18  5:24 PM  Result Value Ref Range   Glucose-Capillary >600 (HH) 70 - 99 mg/dL   Comment 1 Notify RN    Comment 2 Document in Chart   Urinalysis, Complete w Microscopic     Status: Abnormal   Collection Time: 09/19/18  6:15 PM  Result Value Ref Range   Color, Urine STRAW (A) YELLOW   APPearance HAZY (A) CLEAR   Specific Gravity, Urine 1.020 1.005 - 1.030   pH 5.0 5.0 - 8.0   Glucose, UA >=500 (A) NEGATIVE mg/dL   Hgb urine dipstick SMALL (A) NEGATIVE   Bilirubin Urine NEGATIVE NEGATIVE   Ketones, ur NEGATIVE NEGATIVE mg/dL   Protein, ur 100 (A) NEGATIVE mg/dL   Nitrite NEGATIVE NEGATIVE   Leukocytes,Ua NEGATIVE NEGATIVE   RBC / HPF 6-10 0 - 5 RBC/hpf   WBC, UA 0-5 0 - 5 WBC/hpf   Bacteria, UA NONE SEEN NONE SEEN   Squamous Epithelial / LPF 0-5 0 - 5   Amorphous Crystal PRESENT     Comment: Performed at [facility], [address] [place], [place] [address]  Glucose, capillary     Status: Abnormal   Collection Time: 09/19/18  7:38 PM  Result Value Ref Range   Glucose-Capillary 536 (HH) 70 - 99 mg/dL   Comment 1 Document in Chart   Glucose, capillary     Status: Abnormal   Collection Time: 09/19/18  9:00 PM  Result Value Ref Range   Glucose-Capillary 376 (H) 70 - 99 mg/dL  CBC     Status: Abnormal   Collection Time: 09/20/18  5:38 AM  Result Value Ref Range   WBC 11.2 (H) 4.0 - 10.5 K/uL   RBC 2.55 (L) 4.22 - 5.81 MIL/uL   Hemoglobin 6.8  (L) 13.0 - 17.0 g/dL   HCT 20.5 (L) 39.0 - 52.0 %   MCV 80.4 80.0 - 100.0 fL   MCH 26.7 26.0 - 34.0 pg   MCHC 33.2 30.0 - 36.0 g/dL   RDW 12.8 11.5 - 15.5 %   Platelets 423 (H) 150 - 400 K/uL   nRBC 0.0 0.0 - 0.2 %    Comment: Performed at [facility], [address] [place], [place] [address]  Basic metabolic panel     Status: Abnormal   Collection Time: 09/20/18  5:38 AM  Result Value Ref Range   Sodium 136 135 - 145 mmol/L   Potassium 2.9 (L) 3.5 - 5.1 mmol/L   Chloride 100 98 - 111 mmol/L   CO2 24 22 - 32 mmol/L   Glucose, Bld 70 70 - 99 mg/dL   BUN 30 (H) 6 - 20 mg/dL   Creatinine, Ser 1.67 (H) 0.61 - 1.24 mg/dL   Calcium 8.2 (L) 8.9 - 10.3 mg/dL   GFR calc non Af Amer 54 (L) >60 mL/min   GFR calc Af Amer >60 >60 mL/min   Anion gap 12 5 - 15    Comment: Performed at [facility], [address] [place], [place] [address]  Prepare RBC     Status: None   Collection Time: 09/20/18  6:55 AM  Result Value Ref Range   Order Confirmation      ORDER PROCESSED BY BLOOD BANK Performed at [facility], [address] [place], [place] [address]   Type and screen [place]  Status: None (Preliminary result)   Collection Time: 09/20/18  7:21 AM  Result Value Ref Range   ABO/RH(D) A NEG    Antibody Screen NEG    Sample Expiration      09/23/2018 Performed at [facility], [address] [place], [place] [address]    Unit Number [id number]    Blood Component Type RBC LR PHER2    Unit division 00    Status of Unit ALLOCATED    Transfusion Status OK TO TRANSFUSE    Crossmatch Result Compatible   Reticulocytes     Status: Abnormal   Collection Time: 09/20/18  7:21 AM  Result Value Ref Range   Retic Ct Pct 2.2 0.4 - 3.1 %   RBC. 3.22 (L) 4.22 - 5.81 MIL/uL   Retic Count, Absolute 72.1 19.0 - 186.0 K/uL   Immature Retic Fract 11.6 2.3 - 15.9 %    Comment: Performed at [facility], [address] [place], [place] [address]  Glucose, capillary     Status: Abnormal   Collection Time: 09/20/18  7:33 AM  Result Value Ref Range   Glucose-Capillary 66 (L) 70 - 99 mg/dL  Type and screen     Status: None (Preliminary result)   Collection Time: 09/20/18  8:30 AM  Result Value Ref Range   ABO/RH(D) PENDING    Antibody Screen PENDING    Sample Expiration      09/23/2018 Performed at [facility], [address] [place],  [address]     Dg Foot 2 Views Right  Result Date: 09/19/2018 CLINICAL DATA:  Right foot pain EXAM: RIGHT FOOT - 2 VIEW COMPARISON:  07/03/2018 FINDINGS: Postsurgical changes are noted consistent with amputation of the fourth and fifth digits. The proximal aspect of the fourth metatarsal remains. Changes of prior fracture in the third metatarsal are noted with some callus formation although overall nonunion. No new fracture is seen. There is soft tissue air identified inferiorly consistent with diabetic foot ulcer. No underlying bony erosion is noted. IMPRESSION: Soft tissue wound without underlying bony abnormality. Chronic changes as described above. Electronically Signed   By: Inez Catalina M.D.   On: 09/19/2018 18:45    Review of Systems  Constitutional: Positive for chills and fever.  HENT: Negative for congestion and sinus pain.   Eyes: Negative for blurred vision and double vision.  Respiratory: Positive for shortness of breath. Negative for cough.   Cardiovascular: Negative for chest pain and palpitations.  Gastrointestinal: Positive for diarrhea and nausea.  Genitourinary: Negative for dysuria and frequency.  Musculoskeletal:       Recent increase of pain in his right foot and heel.  Skin:       Some increased redness with recent ulceration on his right heel  Neurological: Negative for tingling and sensory change.  Endo/Heme/Allergies: Does not bruise/bleed easily.  Psychiatric/Behavioral: Negative for depression. The patient is not  nervous/anxious.    Blood pressure 125/88, pulse (!) 104, temperature 98.3 F (36.8 C), temperature source Oral, resp. rate 17, height 5\' 6"  (1.676 m), weight 54.4 kg, SpO2 100 %. Physical Exam  Cardiovascular:  DP and PT pulses are fully palpable.  Musculoskeletal:     Comments: Previous amputation of the fourth and fifth toes and raise bilateral.  Neurological:  Sensation still appears to be grossly intact bilateral.  Skin:  The skin is warm dry and supple.  There is a large dry eschar on the bottom of the right heel at least  6 cm x 4 cm.  No active drainage.  There is some surrounding erythema and edema which does extend proximally from the heel.    Assessment/Plan: Assessment: 1.  Ulceration with underlying gas on x-ray. 2.  Diabetes with peripheral vascular disease.  Plan: Discussed with the patient we will need to surgically debride the ulceration on his right heel.  At this point I think we will plan for this afternoon after work.  Discussed possible risks and complications including inability to heal due to his diabetes or infection.  No guarantees could be given as to healing.  He will need to be nonweightbearing on his right lower extremity.  We will keep him n.p.o. after breakfast.  Consent form for debridement right foot.  Plan for surgery later today  [name] 09/20/2018, 8:13 AM

## 2018-09-20 NOTE — Anesthesia Post-op Follow-up Note (Signed)
Anesthesia QCDR form completed.        

## 2018-09-20 NOTE — Op Note (Signed)
Date of operation: 09/20/2018.  Surgeon: [name] D.P.M.  Preoperative diagnosis:  1.  Ulceration with cellulitis and abscess right heel 2.  Superficial ulceration right first metatarsal.  Postoperative diagnosis: Same.  Procedures: 1.  I&D ulceration and abscess right heel. 2.  Superficial I&D ulceration right forefoot.  Anesthesia: LMA with local infiltration.  Hemostasis: None.  Estimated blood loss: 20 cc.  Cultures: Deep wound culture right heel.  Injectables: 19 cc 0.5% bupivacaine plain.  Implants: Stimulan rapid cure antibiotic beads impregnated with vancomycin.  Pathology: None.  Complications: None.  Operative indications: This is a 33 year old male with a history of poorly controlled diabetes and peripheral vascular disease with recent development of a blister and ulceration on his right heel.  Over the last couple of weeks it has progressed to an eschar with cellulitis and he was admitted for infection in the right heel.  X-rays did reveal some air beneath the level of the eschar.  Admitted for antibiotics and debridement.  Operative procedure: Patient was taken to the operating room and placed on the table in the supine position.  Following satisfactory LMA anesthesia the right foot was prepped and draped in the usual sterile fashion.  Attention was then directed to the plantar aspect of the right foot where a superficial ulceration with underlying abscess was noted beneath the right first metatarsal head.  This was sharply incised and excised using scissors and debrided superficial thickness.  Ulceration was intact pre-debridement and post debridement measured approximately 1.5 cm x 1.2 cm.  Depth of 1 to 2 mm.     Attention was then directed to the plantar aspect of the right heel where a full-thickness eschar with underlying ulceration was noted beneath the right heel measuring approximately 4 cm x 6 cm pre-and post debridement.  Sharply using a 15 blade the lesion  was incised around the edges and then debrided out full-thickness down to the deeper tissues and fat layer.  Some central devitalized and versa appearing tissue was noted and this was debrided and excised sharply using rongeurs.  No clear purulence was noted but a culture was taken of the deep tissues for evaluation.  The deep wound was then thoroughly debrided and irrigated with a versa jet on a setting of 4 and 2 and then the superficial ulcerative area and deep tissues were debrided using the versa jet on a setting of 2.  There was noted to be some bleeders noted in a few of these were cauterized.  Still some significant seeping.  Stimulan rapid cure antibiotic beads then placed deep into the wound and Mepitel was stapled around the inferior aspect of the wound and Surgi-Flo injected into the area infiltrated with thrombin.  More antibiotic beads were then placed into the wound and then the plantar area was stapled using the Mepitel and staples.  4 x 4's applied to the heel area with Xeroform and gauze to the forefoot area followed by ABDs and Kerlix.  A second Kerlix and Ace wrap applied to the right foot for compression.  Patient was awakened and transported to the PACU with vital signs stable and in good condition.

## 2018-09-21 ENCOUNTER — Encounter: Payer: Self-pay | Admitting: Podiatry

## 2018-09-21 LAB — GLUCOSE, CAPILLARY
Glucose-Capillary: 188 mg/dL — ABNORMAL HIGH (ref 70–99)
Glucose-Capillary: 164 mg/dL — ABNORMAL HIGH (ref 70–99)

## 2018-09-21 LAB — CBC
HCT: 22.8 % — ABNORMAL LOW (ref 39.0–52.0)
Hemoglobin: 7.3 g/dL — ABNORMAL LOW (ref 13.0–17.0)
MCH: 27.2 pg (ref 26.0–34.0)
MCHC: 32 g/dL (ref 30.0–36.0)
MCV: 85.1 fL (ref 80.0–100.0)
Platelets: 280 10*3/uL (ref 150–400)
RBC: 2.68 MIL/uL — ABNORMAL LOW (ref 4.22–5.81)
RDW: 13.3 % (ref 11.5–15.5)
WBC: 6.9 10*3/uL (ref 4.0–10.5)
nRBC: 0 % (ref 0.0–0.2)

## 2018-09-21 LAB — BASIC METABOLIC PANEL
Anion gap: 7 (ref 5–15)
BUN: 29 mg/dL — ABNORMAL HIGH (ref 6–20)
CO2: 24 mmol/L (ref 22–32)
CREATININE: 1.98 mg/dL — AB (ref 0.61–1.24)
Calcium: 7.6 mg/dL — ABNORMAL LOW (ref 8.9–10.3)
Chloride: 103 mmol/L (ref 98–111)
GFR calc Af Amer: 51 mL/min — ABNORMAL LOW (ref >60)
GFR calc non Af Amer: 44 mL/min — ABNORMAL LOW (ref >60)
Glucose, Bld: 271 mg/dL — ABNORMAL HIGH (ref 70–99)
Potassium: 4.2 mmol/L (ref 3.5–5.1)
Sodium: 134 mmol/L — ABNORMAL LOW (ref 135–145)

## 2018-09-21 MED ORDER — AMOXICILLIN-POT CLAVULANATE 875-125 MG PO TABS: 1.0000 | ORAL_TABLET | Freq: Two times a day (BID) | ORAL | 0 refills | Status: DC

## 2018-09-21 MED ORDER — CIPROFLOXACIN HCL 500 MG PO TABS: 500.0000 mg | ORAL_TABLET | Freq: Two times a day (BID) | ORAL | 0 refills | Status: DC

## 2018-09-21 MED ORDER — GABAPENTIN 300 MG PO CAPS: 300.0000 mg | ORAL_CAPSULE | Freq: Two times a day (BID) | ORAL | 0 refills | Status: DC

## 2018-09-21 MED ORDER — SODIUM CHLORIDE 0.9 % IV SOLN
200.0000 mg | Freq: Once | INTRAVENOUS | Status: AC
  Administered 2018-09-21: 200 mg via INTRAVENOUS
  Filled 2018-09-21: qty 10

## 2018-09-21 MED ORDER — OXYCODONE-ACETAMINOPHEN 10-325 MG PO TABS: 1.0000 | ORAL_TABLET | ORAL | 0 refills | Status: AC | PRN

## 2018-09-21 NOTE — Consult Note (Signed)
PHARMACY CONSULT NOTE - FOLLOW UP  Pharmacy Consult for Electrolyte Monitoring and Replacement   Recent Labs: Potassium level (09/21/2018 @ 0330 )  4.2  Assessment: Potassium level indicates that the patient is WNLs. No replenishment needed at this time.  Goal of Therapy:  Goal is for potassium to be WNLs.   Plan:  Will continue to monitor potassium with AM labs.   Loretha Stapler Clinical Pharmacist 09/21/2018 [phone]

## 2018-09-21 NOTE — Progress Notes (Signed)
Pt discharged per MD order. IV removed. Discharge instructions reviewed with pt. Pt verbalized understanding and voiced understanding of need for the next dressing change to be done on Saturday. Pt given supplies for the dressing change since home health will not be there until Monday. All questions answered to pt satisfaction. Pt taken to car in wheelchair by staff.

## 2018-09-21 NOTE — TOC Transition Note (Signed)
Transition of Care [facility]) - CM/SW Discharge Note   Patient Details  Name: Shawn [name] MRN: [id number] Date of Birth: August 16, 1985  Transition of Care [facility]) CM/SW Contact:  Shela Leff, LCSW Phone Number: 09/21/2018, 4:20 PM   Clinical Narrative:   As stated in previous documentation, patient discharged to home today with home health through [facility].    Final next level of care: Home/Self Care Barriers to Discharge: No Barriers Identified   Patient Goals and CMS Choice        Discharge Placement                       Discharge Plan and Services                          Social Determinants of Health (SDOH) Interventions     Readmission Risk Interventions Readmission Risk Prevention Plan 09/21/2018 09/21/2018  Transportation Screening Complete Complete  Medication Review [employer]) Complete Complete  PCP or Specialist appointment within 3-5 days of discharge Complete Complete  HRI or Home Care Consult Complete -  SW Recovery Care/Counseling Consult Complete -  Palliative Care Screening Not Applicable Not [place] Not Applicable -  Some recent data might be hidden

## 2018-09-21 NOTE — Discharge Summary (Addendum)
[facility] [place] NAME: Shawn [name]    MR#:  [id number]  DATE OF BIRTH:  1985-12-16  DATE OF ADMISSION:  09/19/2018 ADMITTING PHYSICIAN: Saundra Shelling, MD  DATE OF DISCHARGE: 09/21/2018  PRIMARY CARE PHYSICIAN: Valera Castle, MD    ADMISSION DIAGNOSIS:  Hyperglycemia [R73.9] Cellulitis of right foot [L03.115] Nausea vomiting and diarrhea [R11.2, R19.7]  DISCHARGE DIAGNOSIS:  Active Problems:   Uncontrolled diabetes mellitus ([place])   Pressure injury of skin   SECONDARY DIAGNOSIS:   Past Medical History:  Diagnosis Date  . CKD (chronic kidney disease)   . Diabetes mellitus without complication ([place])   . GERD (gastroesophageal reflux disease)   . HTN (hypertension)   . IBS (irritable bowel syndrome)   . Osteomyelitis [facility])     HOSPITAL COURSE:   33 year old male with history of chronic kidney disease stage III and diabetes who presents to the emergency room due to nausea and vomiting with elevated blood sugars and diabetic foot ulcer.  1.  Diabetic foot ulcer with underlying gas on x-ray: Patient is POD #1 incision and debridement. Nonweightbearing on right lower extremity. He will be discharged on Cipro/Augmentin as per previous culture reprorts. He will follow up with Podiatry in 7-10 days. Patient will need home health care for bulky dry dressing changes 3 times a week and then down to 2 times a week depending on drainage and appearance.  2.  Uncontrolled diabetes  Continue Lantus and ADA diet   3.  Hypokalemia: Repleted PRN   4.  Acute on chronic anemia: Iron panel consistent with iron deficiency anemia in combination with chronic anemia Continue ferrous sulfate Patient received IV Iron this morning.  5.  Essential hypertension: Continue lisinopril   DISCHARGE CONDITIONS AND DIET:   Stable Diabetic diet  CONSULTS OBTAINED:  Treatment Team:  Sharlotte Alamo, DPM  DRUG ALLERGIES:    Allergies  Allergen Reactions  . Banana Hives, Nausea And Vomiting and Rash  . Keflex [Cephalexin] Rash    No swelling- also taken penicillin without any issue.  . Sulfa Antibiotics Anaphylaxis  . Grapeseed Extract [Nutritional Supplements] Itching  . Shellfish Allergy Hives    "ALL SEAFOOD"  . Grape Seed Rash    DISCHARGE MEDICATIONS:   Allergies as of 09/21/2018      Reactions   Banana Hives, Nausea And Vomiting, Rash   Keflex [cephalexin] Rash   No swelling- also taken penicillin without any issue.   Sulfa Antibiotics Anaphylaxis   Grapeseed Extract [nutritional Supplements] Itching   Shellfish Allergy Hives   "ALL SEAFOOD"   Grape Seed Rash      Medication List    STOP taking these medications   Magnesium 500 MG Caps   ondansetron 4 MG disintegrating tablet Commonly known as:  Zofran ODT   potassium chloride 10 MEQ tablet Commonly known as:  K-DUR     TAKE these medications   ALPRAZolam 0.25 MG tablet Commonly known as:  XANAX Take 1 tablet (0.25 mg total) by mouth 3 (three) times daily as needed for anxiety.   amoxicillin-clavulanate 875-125 MG tablet Commonly known as:  Augmentin Take 1 tablet by mouth 2 (two) times daily.   ascorbic acid 250 MG tablet Commonly known as:  VITAMIN C Take 1 tablet (250 mg total) by mouth 2 (two) times daily.   calcium carbonate 600 MG Tabs tablet Commonly known as:  Calcium 600 Take 1 tablet (600 mg total) by mouth 2 (two) times daily  with a meal for 30 days.   ciprofloxacin 500 MG tablet Commonly known as:  Cipro Take 1 tablet (500 mg total) by mouth 2 (two) times daily for 11 days.   diphenoxylate-atropine 2.5-0.025 MG tablet Commonly known as:  LOMOTIL Take 1 tablet by mouth 4 (four) times daily as needed for diarrhea or loose stools.   ergocalciferol 1.25 MG (50000 UT) capsule Commonly known as:  VITAMIN D2 Take 1 capsule (50,000 Units total) by mouth 2 (two) times a week. X 6 weeks. What changed:   additional instructions   ferrous sulfate 325 (65 FE) MG tablet Take 1 tablet (325 mg total) by mouth 2 (two) times daily with a meal.   gabapentin 300 MG capsule Commonly known as:  NEURONTIN Take 1 capsule (300 mg total) by mouth 2 (two) times daily. What changed:    medication strength  how much to take  when to take this  additional instructions   insulin aspart 100 UNIT/ML injection Commonly known as:  novoLOG Inject 4 Units into the skin 3 (three) times daily with meals.   insulin glargine 100 UNIT/ML injection Commonly known as:  LANTUS Inject 0.3 mLs (30 Units total) into the skin daily. What changed:    how much to take  when to take this   Insulin Syringes (Disposable) U-100 0.3 ML Misc 1 Syringe by Does not apply route 4 (four) times daily -  with meals and at bedtime.   lisinopril 10 MG tablet Commonly known as:  PRINIVIL,ZESTRIL Take 1 tablet (10 mg total) by mouth daily.   oxyCODONE-acetaminophen 10-325 MG tablet Commonly known as:  PERCOCET Take 1 tablet by mouth every 4 (four) hours as needed for up to 3 days for pain.   promethazine 25 MG tablet Commonly known as:  PHENERGAN Take 1 tablet (25 mg total) by mouth every 6 (six) hours as needed for nausea or vomiting.   Vitamin B-12 5000 MCG Subl Place 1 tablet (5,000 mcg total) under the tongue daily.   Vitamin D3 125 MCG (5000 UT) Caps Take 1 capsule (5,000 Units total) by mouth daily with breakfast. Take along with calcium and magnesium.   zolpidem 10 MG tablet Commonly known as:  AMBIEN Take 10 mg by mouth at bedtime.         Today   CHIEF COMPLAINT:  POD #1 incision and drainage Doing ok no issues reproted   VITAL SIGNS:  Blood pressure 119/82, pulse 97, temperature 98.5 F (36.9 C), temperature source Oral, resp. rate 17, height 5\' 6"  (1.676 m), weight 54.4 kg, SpO2 99 %.   REVIEW OF SYSTEMS:  Review of Systems  Constitutional: Negative.  Negative for chills, fever and  malaise/fatigue.  HENT: Negative.  Negative for ear discharge, ear pain, hearing loss, nosebleeds and sore throat.   Eyes: Negative.  Negative for blurred vision and pain.  Respiratory: Negative.  Negative for cough, hemoptysis, shortness of breath and wheezing.   Cardiovascular: Negative.  Negative for chest pain, palpitations and leg swelling.  Gastrointestinal: Negative.  Negative for abdominal pain, blood in stool, diarrhea, nausea and vomiting.  Genitourinary: Negative.  Negative for dysuria.  Musculoskeletal: Negative.  Negative for back pain.  Skin: Negative.        Foot ulcer debrided  Neurological: Negative for dizziness, tremors, speech change, focal weakness, seizures and headaches.  Endo/Heme/Allergies: Negative.  Does not bruise/bleed easily.  Psychiatric/Behavioral: Negative.  Negative for depression, hallucinations and suicidal ideas.     PHYSICAL EXAMINATION:  GENERAL:  33 y.o.-year-old patient lying in the bed with no acute distress.  NECK:  Supple, no jugular venous distention. No thyroid enlargement, no tenderness.  LUNGS: Normal breath sounds bilaterally, no wheezing, rales,rhonchi  No use of accessory muscles of respiration.  CARDIOVASCULAR: S1, S2 normal. No murmurs, rubs, or gallops.  ABDOMEN: Soft, non-tender, non-distended. Bowel sounds present. No organomegaly or mass.  EXTREMITIES: No pedal edema, cyanosis, or clubbing.  PSYCHIATRIC: The patient is alert and oriented x 3.  SKIN:wrapped  DATA REVIEW:   CBC Recent Labs  Lab 09/21/18 0330  WBC 6.9  HGB 7.3*  HCT 22.8*  PLT 280    Chemistries  Recent Labs  Lab 09/19/18 1723  09/21/18 0330  NA 124*   < > 134*  K 3.6   < > 4.2  CL 90*   < > 103  CO2 22   < > 24  GLUCOSE 720*   < > 271*  BUN 34*   < > 29*  CREATININE 1.94*   < > 1.98*  CALCIUM 8.1*   < > 7.6*  AST 13*  --   --   ALT 9  --   --   ALKPHOS 118  --   --   BILITOT 0.5  --   --    < > = values in this interval not displayed.     Cardiac Enzymes No results for input(s): TROPONINI in the last 168 hours.  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 The proximal aspect of the fourth metatarsal remains. Changes of prior fracture in the third metatarsal are noted with some callus formation although overall nonunion. No new fracture is seen. There is soft tissue air identified inferiorly consistent with diabetic foot ulcer. No underlying bony erosion is noted. IMPRESSION: Soft tissue wound without underlying bony abnormality. Chronic changes as described above. Electronically Signed   By: Inez Catalina M.D.   On: 09/19/2018 18:45      Allergies as of 09/21/2018      Reactions   Banana Hives, Nausea And Vomiting, Rash   Keflex [cephalexin] Rash   No swelling- also taken penicillin without any issue.   Sulfa Antibiotics Anaphylaxis   Grapeseed Extract [nutritional Supplements] Itching   Shellfish Allergy Hives   "ALL SEAFOOD"   Grape Seed Rash      Medication List    STOP taking these medications   Magnesium 500 MG Caps   ondansetron 4 MG disintegrating tablet Commonly known as:  Zofran ODT   potassium chloride 10 MEQ tablet Commonly known as:  K-DUR     TAKE these medications   ALPRAZolam 0.25 MG tablet Commonly known as:  XANAX Take 1 tablet (0.25 mg total) by mouth 3 (three) times daily as needed for anxiety.   amoxicillin-clavulanate 875-125 MG tablet Commonly known as:  Augmentin Take 1 tablet by mouth 2 (two) times daily.   ascorbic acid 250 MG tablet Commonly known as:  VITAMIN C Take 1 tablet (250 mg total) by mouth 2 (two) times daily.   calcium carbonate 600 MG Tabs tablet Commonly known as:  Calcium 600 Take 1 tablet (600 mg total) by mouth 2 (two)  times daily with a meal for 30 days.   ciprofloxacin 500 MG tablet Commonly known as:  Cipro Take 1 tablet (500 mg total) by mouth 2 (two) times daily for 11 days.   diphenoxylate-atropine 2.5-0.025 MG tablet Commonly known as:  LOMOTIL Take 1 tablet by mouth 4 (four) times daily as needed for diarrhea or loose stools.   ergocalciferol 1.25 MG (50000 UT) capsule Commonly known as:  VITAMIN D2 Take 1 capsule (50,000 Units total) by mouth 2 (two) times a week. X 6 weeks. What changed:  additional instructions   ferrous sulfate 325 (65 FE) MG tablet Take 1 tablet (325 mg total) by mouth 2 (two) times daily with a meal.   gabapentin 300 MG capsule Commonly known as:  NEURONTIN Take 1 capsule (300 mg total) by mouth 2 (two) times daily. What changed:    medication strength  how much to take  when to take this  additional instructions   insulin aspart 100 UNIT/ML injection Commonly known as:  novoLOG Inject 4 Units into the skin 3 (three) times daily with meals.   insulin glargine 100 UNIT/ML injection Commonly known as:  LANTUS Inject 0.3 mLs (30 Units total) into the skin daily. What changed:    how much to take  when to take this   Insulin Syringes (Disposable) U-100 0.3 ML Misc 1 Syringe by Does not apply route 4 (four) times daily -  with meals and at bedtime.   lisinopril 10 MG tablet Commonly known as:  PRINIVIL,ZESTRIL Take 1 tablet (10 mg total) by mouth daily.   oxyCODONE-acetaminophen 10-325 MG tablet Commonly known as:  PERCOCET Take 1 tablet by mouth every 4 (four) hours as needed for up to 3 days for pain.   promethazine 25 MG tablet Commonly known as:  PHENERGAN Take 1 tablet (25 mg total) by mouth every 6 (six) hours as needed for nausea or vomiting.   Vitamin B-12 5000 MCG Subl Place 1 tablet (5,000 mcg total) under the tongue daily.   Vitamin D3 125 MCG (5000 UT) Caps Take 1 capsule (5,000 Units total) by mouth daily with breakfast. Take  along with calcium and magnesium.   zolpidem 10 MG tablet Commonly known as:  AMBIEN Take 10 mg by mouth at bedtime.          Management plans discussed with the patient and he is in agreement. Stable for discharge   Patient should follow up with podiatry  CODE STATUS:     Code Status Orders  (From admission, onward)         Start     Ordered   09/19/18 2050  Full code  Continuous     09/19/18 2049        Code Status History    Date Active Date Inactive Code Status Order ID Comments User Context   08/16/2018 4098 08/18/2018 2142 Full Code [id number]  Hillary Bow, MD ED   07/16/2018 1739 07/18/2018 0104 Full Code [id number]  Dustin Flock, MD Inpatient   07/10/2018 0111 07/11/2018 1955 Full Code [id number]  Gorden Harms, MD Inpatient   06/14/2018 1744 06/20/2018 2123 Full Code [id number]  Loletha Grayer, MD ED   05/28/2018 0437 05/28/2018 1926 Full Code [id number]  Arta Silence, MD ED   04/27/2018 1726 05/02/2018 1713 Full Code [id number]  Henreitta Leber, MD Inpatient   04/05/2018 1724 04/11/2018 2142 Full Code [id number]  Dustin Flock, MD Inpatient   02/06/2018 2022 02/07/2018 1512 Full Code [id number]  Demetrios Loll, MD Inpatient   01/10/2018 2259 01/11/2018 1947 Full Code [id number]  Amelia Jo, MD Inpatient  12/30/2017 0019 01/03/2018 1457 Full Code [id number]  Harrie Foreman, MD Inpatient   12/20/2017 1541 12/27/2017 2147 Full Code [id number]  Sharlotte Alamo, [facility] Inpatient   11/10/2017 0049 11/11/2017 1632 Full Code [id number]  Amelia Jo, MD Inpatient   07/13/2017 2112 07/15/2017 1730 Full Code [id number]  Jules Husbands, MD ED      TOTAL TIME TAKING CARE OF THIS PATIENT: 38 minutes.    Note: This dictation was prepared with Dragon dictation along with smaller phrase technology. Any transcriptional errors that result from this process are unintentional.  Bettey Costa M.D on 09/21/2018 at 1:13 PM  Between 7am to 6pm - Pager - [phone] After 6pm go to [email or web address] -  password [email or web address] [facility]  Office  [phone]  CC: Primary care physician; Valera Castle, MD

## 2018-09-21 NOTE — TOC Initial Note (Signed)
Transition of Care [facility]) - Initial/Assessment Note    Patient Details  Name: Shawn [name] MRN: [id number] Date of Birth: 1986/01/12  [facility] CM/SW Contact:    Shela Leff, LCSW Phone Number: 09/21/2018, 4:15 PM  Clinical Narrative:       CSW met with patient this afternoon due to patient discharging and requiring dry dressing changes. Podiatry requested home health be arranged and CSW worked with podiatry to obtain the necessary orders for the dressing changes. Patient is aware that his previous home health agency was unable to take him back. Due to patient having medicaid, [employer] was contacted and patient was in agreement with this. [employer] stated that because patient has medicaid and needed dressing changes, medicaid would only pay for 1 visit per week. CSW informed podiatry and they will see patient in their office once a week to do another dressing change and patient agreed that he and his father could learn the dressing changes. Patient stated that he was going to have a problem paying the $3 copay for the physician office visit. CSW obtained $6.00 in petty cash to pay for the first 2 weeks. CSW provided patient with the contact information with [facility]. Patient was extremely appreciative.             Expected Discharge Plan: (P) Home w Home Health Services Barriers to Discharge: (P) No Barriers Identified   Patient Goals and CMS Choice        Expected Discharge Plan and Services Expected Discharge Plan: (P) [place]         Expected Discharge Date: 09/21/18                        Prior Living Arrangements/Services                       Activities of Daily Living Home Assistive Devices/Equipment: Wheelchair, Environmental consultant (specify type) ADL Screening (condition at time of admission) Patient's cognitive ability adequate to safely complete daily activities?: Yes Is the patient deaf or have difficulty hearing?: No Does the  patient have difficulty seeing, even when wearing glasses/contacts?: No Does the patient have difficulty concentrating, remembering, or making decisions?: No Patient able to express need for assistance with ADLs?: Yes Does the patient have difficulty dressing or bathing?: No Independently performs ADLs?: Yes (appropriate for developmental age) Does the patient have difficulty walking or climbing stairs?: Yes Weakness of Legs: Both Weakness of Arms/Hands: None  Permission Sought/Granted                  Emotional Assessment              Admission diagnosis:  Hyperglycemia [R73.9] Cellulitis of right foot [L03.115] Nausea vomiting and diarrhea [R11.2, R19.7] Patient Active Problem List   Diagnosis Date Noted  . Pressure injury of skin 09/20/2018  . Uncontrolled diabetes mellitus ([place]) 09/19/2018  . Neurogenic pain 09/13/2018  . Marijuana use 09/13/2018  . Long term prescription benzodiazepine use 09/13/2018  . Insulin-dependent diabetes mellitus with renal complications ([place]) 50/27/7412  . Vitamin D deficiency 08/21/2018  . Hypocalcemia 08/21/2018  . CKD stage G3a/A1, GFR 45-59 and albumin creatinine ratio <30 mg/g (HCC) 08/21/2018  . History of acute renal failure 08/21/2018  . [place] (hyperglycemic hyperosmolar nonketotic coma) ([place]) 08/16/2018  . Chronic foot pain (Primary Area of Pain) (Bilateral) (L>R) 08/14/2018  . Chronic lower extremity pain (Secondary Area of Pain) (Bilateral) (  L>R) 08/14/2018  . Chronic generalized abdominal pain [facility]) 08/14/2018  . Chronic pain syndrome 08/14/2018  . Long term current use of opiate analgesic 08/14/2018  . Pharmacologic therapy 08/14/2018  . Disorder of skeletal system 08/14/2018  . Problems influencing health status 08/14/2018  . Nausea & vomiting 07/16/2018  . IV infusion line dysfunction ([place]) 07/09/2018  . Hyperglycemic hyperosmolar nonketotic coma ([place]) 05/28/2018  . Acute renal failure (ARF) ([place])  04/27/2018  . Left foot infection 04/05/2018  . Foot infection 02/06/2018  . Local infection of the skin and subcutaneous tissue, unspecified 02/06/2018  . Hypertension associated with diabetes ([place]) 01/16/2018  . Osteomyelitis ([place]) 01/10/2018  . Cellulitis 12/29/2017  . Sepsis ([place]) 12/18/2017  . Moderate recurrent major depression ([place]) 11/10/2017  . Type 2 diabetes mellitus with hyperosmolar nonketotic hyperglycemia ([place]) 11/09/2017  . History of migraine 07/15/2017  . IBS (irritable bowel syndrome) 07/15/2017  . Protein-calorie malnutrition, severe 07/14/2017  . Abdominal pain 07/13/2017  . Diarrhea 06/01/2013  . 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 GERD (gastroesophageal reflux disease) 12/30/2009  . Type 1 diabetes mellitus with diabetic polyneuropathy ([place]) 11/25/2009  . MDD (major depressive disorder) 11/25/2009  . Hypercholesterolemia 03/07/2008   PCP:  Valera Castle, MD Pharmacy:   [facility], [place] [address] [place] [address] Phone: [phone] Fax: [phone]     Social Determinants of Health (SDOH) Interventions    Readmission Risk Interventions Readmission Risk Prevention Plan 09/21/2018 09/21/2018  Transportation Screening Complete Complete  Medication Review ([place]) Complete Complete  PCP or Specialist appointment within 3-5 days of discharge Complete Complete  HRI or Home Care Consult Complete -  SW Recovery Care/Counseling Consult Complete -  Palliative Care Screening Not Applicable Not [place] Not Applicable -  Some recent data might be hidden

## 2018-09-21 NOTE — Progress Notes (Signed)
Inpatient Diabetes Program Recommendations  AACE/ADA: New Consensus Statement on Inpatient Glycemic Control (2015)  Target Ranges:  Prepandial:   less than 140 mg/dL      Peak postprandial:   less than 180 mg/dL (1-2 hours)      Critically ill patients:  140 - 180 mg/dL   Lab Results  Component Value Date   GLUCAP 166 (H) 09/20/2018   HGBA1C 13.7 (H) 09/20/2018    Review of Glycemic Control Results for Shawn [name], Shawn [name] (MRN [id number]) as of 09/21/2018 07:59  Ref. Range 09/19/2018 19:38 09/19/2018 21:00 09/20/2018 07:33 09/20/2018 10:11 09/20/2018 11:31 09/20/2018 16:13 09/20/2018 18:33 09/20/2018 19:16 09/20/2018 21:03  Glucose-Capillary Latest Ref Range: 70 - 99 mg/dL 536 (HH) 376 (H) 66 (L) 150 (H) 131 (H) 70 65 (L) 108 (H) 166 (H)   Diabetes history: DM1 Outpatient Diabetes medications:  Outpatient Diabetes medications:  Per d/c summary on 08/16/18, patient was supposed to be taking Lantus 30 units daily and Novolog 4 units tid with meals Current orders for Inpatient glycemic control:  Lantus 15 units bid, Novolog sensitive tid with meals and HS and Novolog 4 units tid with meals  Inpatient Diabetes Program Recommendations:    Note low blood sugars on 09/20/18.  Patient received a total of 32 units of Lantus yesterday and Lantus 35 units on 3/18. He also received resistant Novolog on 3/18 at noon and was NPO.  Insulin doses were reduced and blood sugars should improve today.  No recommendations at this time.   Thanks,  Adah Perl, RN, BC-ADM Inpatient Diabetes Coordinator Pager [phone] (8a-5p)

## 2018-09-21 NOTE — Consult Note (Signed)
MEDICATION RELATED CONSULT NOTE - FOLLOW UP   Pharmacy Consult for Venofer (Iron Sucrose) Indication: Iron Deficiency Anemia related to CKD  Allergies  Allergen Reactions  . Banana Hives, Nausea And Vomiting and Rash  . Keflex [Cephalexin] Rash    No swelling- also taken penicillin without any issue.  . Sulfa Antibiotics Anaphylaxis  . Grapeseed Extract [Nutritional Supplements] Itching  . Shellfish Allergy Hives    "ALL SEAFOOD"  . Grape Seed Rash    Patient Measurements: Height: 5\' 6"  (167.6 cm) Weight: 120 lb (54.4 kg) IBW/kg (Calculated) : 63.8  Labs: Recent Labs    09/19/18 1723 09/20/18 0538 09/20/18 0705 09/21/18 0330  WBC 13.5* 11.2* 13.3* 6.9  HGB 9.0* 6.8* 8.7* 7.3*  HCT 25.6* 20.5* 25.8* 22.8*  PLT 355 423* 537* 280  CREATININE 1.94* 1.67*  --  1.98*  ALBUMIN 2.2*  --   --   --   PROT 6.2*  --   --   --   AST 13*  --   --   --   ALT 9  --   --   --   ALKPHOS 118  --   --   --   BILITOT 0.5  --   --   --     Iron/TIBC/Ferritin/ %Sat    Component Value Date/Time   IRON 26 (L) 09/20/2018 0721   TIBC 231 (L) 09/20/2018 0721   FERRITIN 382 (H) 09/20/2018 0721   IRONPCTSAT 11 (L) 09/20/2018 0721     Estimated Creatinine Clearance: 42 mL/min (A) (by C-G formula based on SCr of 1.98 mg/dL (H)).    Medications:  Ferrous Sulfate 325mg  bid - will continue  Assessment: Pharmacy has been consulted for dosing of Iron Sucrose in pt with Hgb 7.3 and Iron level 26 ug/dL (RR 45-182)  Goal of Therapy:  HGB > 8 and Iron wnl's  Plan:  Will dose 200mg  Venofer and monitor/reassess pt needs every 3 days as clinically indicated.  Lu Duffel, PharmD, BCPS Clinical Pharmacist 09/21/2018 8:37 AM

## 2018-09-21 NOTE — Progress Notes (Signed)
[facility] NAME: Shawn [name]    MR#:  [id number]  DATE OF BIRTH:  1985-10-09  SUBJECTIVE:   POD #1 debridement of foot ulcer/absess Doing ok  REVIEW OF SYSTEMS:    Review of Systems  Constitutional: ++ for fever, chillsno  weight loss HENT: Negative for ear pain, nosebleeds, congestion, facial swelling, rhinorrhea, neck pain, neck stiffness and ear discharge.   Respiratory: Negative for cough, shortness of breath, wheezing  Cardiovascular: Negative for chest pain, palpitations and leg swelling.  Gastrointestinal: Negative for heartburn, abdominal pain, vomiting, diarrhea or consitpation Genitourinary: Negative for dysuria, urgency, frequency, hematuria Musculoskeletal: Negative for back pain or joint pain Neurological: Negative for dizziness, seizures, syncope, focal weakness,  numbness and headaches.  Hematological: Does not bruise/bleed easily.  Psychiatric/Behavioral: Negative for hallucinations, confusion, dysphoric mood  Tolerating Diet: yes      DRUG ALLERGIES:   Allergies  Allergen Reactions  . Banana Hives, Nausea And Vomiting and Rash  . Keflex [Cephalexin] Rash    No swelling- also taken penicillin without any issue.  . Sulfa Antibiotics Anaphylaxis  . Grapeseed Extract [Nutritional Supplements] Itching  . Shellfish Allergy Hives    "ALL SEAFOOD"  . Grape Seed Rash    VITALS:  Blood pressure 119/82, pulse 97, temperature 98.5 F (36.9 C), temperature source Oral, resp. rate 17, height 5\' 6"  (1.676 m), weight 54.4 kg, SpO2 99 %.  PHYSICAL EXAMINATION:  Constitutional: Appears thin no distress. HENT: Normocephalic. Marland Kitchen Oropharynx is clear and moist.  Eyes: Conjunctivae and EOM are normal. PERRLA, no scleral icterus.  Neck: Normal ROM. Neck supple. No JVD. No tracheal deviation. CVS: RRR, S1/S2 +, no murmurs, no gallops, no carotid bruit.  Pulmonary: Effort and breath sounds normal, no stridor, rhonchi,  wheezes, rales.  Abdominal: Soft. BS +,  no distension, tenderness, rebound or guarding.  Musculoskeletal: Normal range of motion. No edema and no tenderness.  Neuro: Alert. CN 2-12 grossly intact. No focal deficits. 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      LABORATORY PANEL:   CBC Recent Labs  Lab 09/21/18 0330  WBC 6.9  HGB 7.3*  HCT 22.8*  PLT 280   ------------------------------------------------------------------------------------------------------------------  Chemistries  Recent Labs  Lab 09/19/18 1723  09/21/18 0330  NA 124*   < > 134*  K 3.6   < > 4.2  CL 90*   < > 103  CO2 22   < > 24  GLUCOSE 720*   < > 271*  BUN 34*   < > 29*  CREATININE 1.94*   < > 1.98*  CALCIUM 8.1*   < > 7.6*  AST 13*  --   --   ALT 9  --   --   ALKPHOS 118  --   --   BILITOT 0.5  --   --    < > = values in this interval not displayed.   ------------------------------------------------------------------------------------------------------------------  Cardiac Enzymes No results for input(s): TROPONINI in the last 168 hours. 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 The proximal aspect of the fourth metatarsal remains. Changes of prior fracture in the third metatarsal are noted with some callus formation although overall nonunion. No new fracture is seen. There  is soft tissue air identified inferiorly consistent with diabetic foot ulcer. No underlying bony erosion is noted. IMPRESSION: Soft tissue wound without underlying bony abnormality. Chronic changes as described above. Electronically Signed   By: Inez Catalina M.D.    On: 09/19/2018 18:45     ASSESSMENT AND PLAN:   33 year old male with history of chronic kidney disease stage III and diabetes who presents to the emergency room due to nausea and vomiting with elevated blood sugars and diabetic foot ulcer.  1.  Diabetic foot ulcer with underlying gas on x-ray: Patient is POD #1 incision and debridement. Nonweightbearing on right lower extremity. Continue vancomycin and Zosyn for now  Management as per PODIATRY  2.  Uncontrolled diabetes  Continue Lantus with sliding scale and ADA diet   3.  Hypokalemia: Repleted PRN   4.  Acute on chronic anemia: Iron panel consistent with iron deficiency anemia in combination with chronic anemia Continue ferrous sulfate Patient ordered IV Iron this am.  5.  Essential hypertension: Continue lisinopril  Management plans discussed with the patient and he is in agreement.  CODE STATUS: full  TOTAL TIME TAKING CARE OF THIS PATIENT: 24 minutes.     POSSIBLE D/C tomorrow, DEPENDING ON CLINICAL CONDITION.   Bettey Costa M.D on 09/21/2018 at 12:39 PM  Between 7am to 6pm - Pager - [phone] After 6pm go to [email or web address] - password [email or web address] [facility]  Office  [phone]  CC: Primary care physician; Valera Castle, MD  Note: This dictation was prepared with Dragon dictation along with smaller phrase technology. Any transcriptional errors that result from this process are unintentional.

## 2018-09-21 NOTE — Progress Notes (Signed)
1 Day Post-Op   Subjective/Chief Complaint: Patient seen.  Significant pain in his right foot.  States a level of approximately 8.  Pain medication does seem to help.   Objective: Vital signs in last 24 hours: Temp:  [98 F (36.7 C)-100.1 F (37.8 C)] 98.5 F (36.9 C) (03/19 0456) Pulse Rate:  [91-111] 97 (03/19 0456) Resp:  [11-20] 17 (03/19 0456) BP: (119-140)/(82-101) 119/82 (03/19 0456) SpO2:  [99 %-100 %] 99 % (03/19 0456) Last BM Date: 09/19/18  Intake/Output from previous day: 03/18 0701 - 03/19 0700 In: 1932.8 [I.V.:1718.3; IV Piggyback:214.5] Out: 1175 [Urine:1175] Intake/Output this shift: Total I/O In: 240 [P.O.:240] Out: -   The bandage is dry and intact.  There is some moderate bleeding with minimal drainage consistent with his antibiotic beads noted.  Significant reduction in the erythema and edema in the right heel and lower leg.  Mepitel and staples are intact.  Lab Results:  Recent Labs    09/20/18 0705 09/21/18 0330  WBC 13.3* 6.9  HGB 8.7* 7.3*  HCT 25.8* 22.8*  PLT 537* 280   BMET Recent Labs    09/20/18 0538 09/20/18 1625 09/21/18 0330  NA 136  --  134*  K 2.9* 3.6 4.2  CL 100  --  103  CO2 24  --  24  GLUCOSE 70  --  271*  BUN 30*  --  29*  CREATININE 1.67*  --  1.98*  CALCIUM 8.2*  --  7.6*   PT/INR No results for input(s): LABPROT, INR in the last 72 hours. ABG No results for input(s): PHART, HCO3 in the last 72 hours.  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 The proximal aspect of the fourth metatarsal remains. Changes of prior fracture in the third metatarsal are noted with some callus formation although overall nonunion. No new fracture is seen. There is soft tissue air identified inferiorly  consistent with diabetic foot ulcer. No underlying bony erosion is noted. IMPRESSION: Soft tissue wound without underlying bony abnormality. Chronic changes as described above. Electronically Signed   By: Inez Catalina M.D.   On: 09/19/2018 18:45    Anti-infectives: Anti-infectives (From admission, onward)   Start     Dose/Rate Route Frequency Ordered Stop   09/20/18 2300  vancomycin (VANCOCIN) IVPB 750 mg/150 ml premix     750 mg 150 mL/hr over 60 Minutes Intravenous Every 24 hours 09/20/18 0042     09/20/18 1400  piperacillin-tazobactam (ZOSYN) IVPB 3.375 g     3.375 g 12.5 mL/hr over 240 Minutes Intravenous Every 8 hours 09/20/18 1106     09/19/18 2200  vancomycin (VANCOCIN) 1,250 mg in sodium chloride 0.9 % 250 mL IVPB     1,250 mg 166.7 mL/hr over 90 Minutes Intravenous  Once 09/19/18 2146 09/20/18 0049   09/19/18 2030  clindamycin (CLEOCIN) IVPB 600 mg     600 mg 100 mL/hr over 30 Minutes Intravenous  Once 09/19/18 1942 09/20/18 0230      Assessment/Plan: s/p Procedure(s): IRRIGATION AND DEBRIDEMENT FOOT (Right) Assessment: Stable status post debridement right heel.   Plan: Dry sterile dressing change reapplied to the right foot.  Patient should be stable for discharge on appropriate antibiotics and pain medication.  Patient needs to be completely nonweightbearing on the right lower  extremity.  Patient will need home health care for bulky dry dressing changes 3 times a week and then down to 2 times a week depending on drainage and appearance.  Follow-up 1 to 2 weeks outpatient for reevaluation.  LOS: 2 days    [name] 09/21/2018

## 2018-09-22 LAB — TYPE AND SCREEN
ABO/RH(D): A NEG
Antibody Screen: NEGATIVE
Unit division: 0

## 2018-09-22 LAB — BPAM RBC
Blood Product Expiration Date: 202003252359
Unit Type and Rh: 600

## 2018-09-22 LAB — PREPARE RBC (CROSSMATCH)

## 2018-09-24 ENCOUNTER — Other Ambulatory Visit: Payer: Self-pay

## 2018-09-24 ENCOUNTER — Encounter: Payer: Self-pay | Admitting: Emergency Medicine

## 2018-09-24 ENCOUNTER — Emergency Department
Admission: EM | Admit: 2018-09-24 | Discharge: 2018-09-24 | Discharge status: Against Medical Advice | Payer: Medicaid Other | Attending: Emergency Medicine | Admitting: Emergency Medicine

## 2018-09-24 DIAGNOSIS — Z794 Long term (current) use of insulin: Secondary | ICD-10-CM | POA: Diagnosis not present

## 2018-09-24 DIAGNOSIS — E1022 Type 1 diabetes mellitus with diabetic chronic kidney disease: Secondary | ICD-10-CM | POA: Insufficient documentation

## 2018-09-24 DIAGNOSIS — N183 Chronic kidney disease, stage 3 (moderate): Secondary | ICD-10-CM | POA: Diagnosis not present

## 2018-09-24 DIAGNOSIS — M79671 Pain in right foot: Secondary | ICD-10-CM

## 2018-09-24 DIAGNOSIS — E1065 Type 1 diabetes mellitus with hyperglycemia: Secondary | ICD-10-CM | POA: Diagnosis not present

## 2018-09-24 DIAGNOSIS — E860 Dehydration: Secondary | ICD-10-CM | POA: Diagnosis not present

## 2018-09-24 DIAGNOSIS — I129 Hypertensive chronic kidney disease with stage 1 through stage 4 chronic kidney disease, or unspecified chronic kidney disease: Secondary | ICD-10-CM | POA: Diagnosis not present

## 2018-09-24 DIAGNOSIS — R739 Hyperglycemia, unspecified: Secondary | ICD-10-CM

## 2018-09-24 LAB — CBC WITH DIFFERENTIAL/PLATELET
Abs Immature Granulocytes: 0.15 10*3/uL — ABNORMAL HIGH (ref 0.00–0.07)
Basophils Absolute: 0.1 10*3/uL (ref 0.0–0.1)
Basophils Relative: 1 %
Eosinophils Absolute: 0.2 10*3/uL (ref 0.0–0.5)
Eosinophils Relative: 2 %
HCT: 27.1 % — ABNORMAL LOW (ref 39.0–52.0)
Hemoglobin: 8.4 g/dL — ABNORMAL LOW (ref 13.0–17.0)
Immature Granulocytes: 1 %
Lymphocytes Relative: 16 %
Lymphs Abs: 2.2 10*3/uL (ref 0.7–4.0)
MCH: 26.8 pg (ref 26.0–34.0)
MCHC: 31 g/dL (ref 30.0–36.0)
MCV: 86.3 fL (ref 80.0–100.0)
MONOS PCT: 10 %
Monocytes Absolute: 1.3 10*3/uL — ABNORMAL HIGH (ref 0.1–1.0)
Neutro Abs: 9.8 10*3/uL — ABNORMAL HIGH (ref 1.7–7.7)
Neutrophils Relative %: 70 %
Platelets: 492 10*3/uL — ABNORMAL HIGH (ref 150–400)
RBC: 3.14 MIL/uL — ABNORMAL LOW (ref 4.22–5.81)
RDW: 13 % (ref 11.5–15.5)
WBC: 13.8 10*3/uL — ABNORMAL HIGH (ref 4.0–10.5)
nRBC: 0 % (ref 0.0–0.2)

## 2018-09-24 LAB — GLUCOSE, CAPILLARY: Glucose-Capillary: 471 mg/dL — ABNORMAL HIGH (ref 70–99)

## 2018-09-24 LAB — COMPREHENSIVE METABOLIC PANEL
ALT: 9 U/L (ref 0–44)
AST: 21 U/L (ref 15–41)
Albumin: 2.2 g/dL — ABNORMAL LOW (ref 3.5–5.0)
Alkaline Phosphatase: 96 U/L (ref 38–126)
Anion gap: 8 (ref 5–15)
BUN: 30 mg/dL — ABNORMAL HIGH (ref 6–20)
CO2: 23 mmol/L (ref 22–32)
Calcium: 7.8 mg/dL — ABNORMAL LOW (ref 8.9–10.3)
Chloride: 98 mmol/L (ref 98–111)
Creatinine, Ser: 2.63 mg/dL — ABNORMAL HIGH (ref 0.61–1.24)
GFR calc Af Amer: 36 mL/min — ABNORMAL LOW (ref >60)
GFR calc non Af Amer: 31 mL/min — ABNORMAL LOW (ref >60)
GLUCOSE: 567 mg/dL — AB (ref 70–99)
Potassium: 4.8 mmol/L (ref 3.5–5.1)
Sodium: 129 mmol/L — ABNORMAL LOW (ref 135–145)
Total Bilirubin: 0.5 mg/dL (ref 0.3–1.2)
Total Protein: 6 g/dL — ABNORMAL LOW (ref 6.5–8.1)

## 2018-09-24 MED ORDER — SODIUM CHLORIDE 0.9 % IV BOLUS: 2000.0000 mL | Freq: Once | INTRAVENOUS | Status: DC

## 2018-09-24 MED ORDER — ONDANSETRON HCL 4 MG/2ML IJ SOLN
4.0000 mg | Freq: Once | INTRAMUSCULAR | Status: DC
  Filled 2018-09-24: qty 2

## 2018-09-24 MED ORDER — LEVOFLOXACIN 750 MG PO TABS
750.0000 mg | ORAL_TABLET | Freq: Once | ORAL | Status: AC
  Administered 2018-09-24: 750 mg via ORAL
  Filled 2018-09-24: qty 1

## 2018-09-24 MED ORDER — KETOROLAC TROMETHAMINE 30 MG/ML IJ SOLN
15.0000 mg | INTRAMUSCULAR | Status: DC
  Filled 2018-09-24: qty 1

## 2018-09-24 MED ORDER — ACETAMINOPHEN 500 MG PO TABS
1000.0000 mg | ORAL_TABLET | Freq: Once | ORAL | Status: AC
  Administered 2018-09-24: 1000 mg via ORAL
  Filled 2018-09-24: qty 2

## 2018-09-24 MED ORDER — INSULIN ASPART 100 UNIT/ML ~~LOC~~ SOLN
10.0000 [IU] | Freq: Once | SUBCUTANEOUS | Status: DC
  Filled 2018-09-24: qty 1

## 2018-09-24 MED ORDER — LEVOFLOXACIN IN D5W 750 MG/150ML IV SOLN: 750.0000 mg | Freq: Once | INTRAVENOUS | Status: DC

## 2018-09-24 MED ORDER — LEVOFLOXACIN 750 MG PO TABS: 750.0000 mg | ORAL_TABLET | Freq: Every day | ORAL | 0 refills | Status: DC

## 2018-09-24 NOTE — ED Triage Notes (Signed)
Pt c/o vomiting and diarrhea. PT has surgery last week on RT foot for wound. Pt appears pale and fatigue. PT states glucose was 400 at home today. Pt wound is open and has foul smell with + drainage noted.

## 2018-09-24 NOTE — ED Notes (Signed)
Nurse dee went in to attempt iv and pt refused stating he wants to leave. Pt medicated previously with his antibiotic and tylenol but refused other meds. Pt signed out ama and ambulated with no difficulty, steady gait, to the exit.

## 2018-09-24 NOTE — ED Provider Notes (Signed)
[facility] Emergency Department Provider Note  ____________________________________________  Time seen: Approximately 7:59 PM  I have reviewed the triage vital signs and the nursing notes.   HISTORY  Chief Complaint Emesis    HPI Shawn [name] is a 33 y.o. male with a history of CKD, diabetes, hypertension, medication noncompliance, chronic pain and opioid dependence who comes the ED complaining of right foot pain which is been present for at least a week.  He was recently in the hospital and had surgical debridement of the right foot by podiatry.  Review of the operative note shows that he had antibiotic beads implanted as well.  He has a follow-up appointment with his podiatrist tomorrow.  He has been taking Augmentin for treatment of the infection, but deep wound culture came back positive for staph aureus which is treated by Augmentin and Enterobacter cloacae which is not.   He complains of nausea and diarrhea today.  Reports poor oral intake today.  States has been compliant with his medications at home.  He request pain medicine.     Past Medical History:  Diagnosis Date  . CKD (chronic kidney disease)   . Diabetes mellitus without complication ([place])   . GERD (gastroesophageal reflux disease)   . HTN (hypertension)   . IBS (irritable bowel syndrome)   . Osteomyelitis [facility])      Patient Active Problem List   Diagnosis Date Noted  . Pressure injury of skin 09/20/2018  . Uncontrolled diabetes mellitus ([place]) 09/19/2018  . Neurogenic pain 09/13/2018  . Marijuana use 09/13/2018  . Long term prescription benzodiazepine use 09/13/2018  . Insulin-dependent diabetes mellitus with renal complications ([place]) 85/63/1497  . Vitamin D deficiency 08/21/2018  . Hypocalcemia 08/21/2018  . CKD stage G3a/A1, GFR 45-59 and albumin creatinine ratio <30 mg/g (HCC) 08/21/2018  . History of acute renal failure 08/21/2018  . [place] (hyperglycemic hyperosmolar  nonketotic coma) ([place]) 08/16/2018  . Chronic foot pain (Primary Area of Pain) (Bilateral) (L>R) 08/14/2018  . Chronic lower extremity pain (Secondary Area of Pain) (Bilateral) (L>R) 08/14/2018  . Chronic generalized abdominal pain [facility] Area of Pain) 08/14/2018  . Chronic pain syndrome 08/14/2018  . Long term current use of opiate analgesic 08/14/2018  . Pharmacologic therapy 08/14/2018  . Disorder of skeletal system 08/14/2018  . Problems influencing health status 08/14/2018  . Nausea & vomiting 07/16/2018  . IV infusion line dysfunction ([place]) 07/09/2018  . Hyperglycemic hyperosmolar nonketotic coma ([place]) 05/28/2018  . Acute renal failure (ARF) ([place]) 04/27/2018  . Left foot infection 04/05/2018  . Foot infection 02/06/2018  . Local infection of the skin and subcutaneous tissue, unspecified 02/06/2018  . Hypertension associated with diabetes ([place]) 01/16/2018  . Osteomyelitis ([place]) 01/10/2018  . Cellulitis 12/29/2017  . Sepsis ([place]) 12/18/2017  . Moderate recurrent major depression ([place]) 11/10/2017  . Type 2 diabetes mellitus with hyperosmolar nonketotic hyperglycemia ([place]) 11/09/2017  . History of migraine 07/15/2017  . IBS (irritable bowel syndrome) 07/15/2017  . Protein-calorie malnutrition, severe 07/14/2017  . Abdominal pain 07/13/2017  . Diarrhea 06/01/2013  . 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 GERD (gastroesophageal reflux disease) 12/30/2009  . Type 1 diabetes mellitus with diabetic polyneuropathy ([place]) 11/25/2009  . MDD (major depressive disorder) 11/25/2009  . Hypercholesterolemia 03/07/2008     Past Surgical History:  Procedure Laterality Date  . ABDOMINAL AORTOGRAM W/LOWER EXTREMITY Right 12/23/2017   Procedure: ABDOMINAL AORTOGRAM W/LOWER EXTREMITY;  Surgeon: Katha Cabal, MD;  Location: [place] [facility] [place] [facility];  Service: Cardiovascular;  Laterality: Right;  . ACHILLES TENDON SURGERY Bilateral 06/16/2018   Procedure: ACHILLES LENGTHENING/KIDNER;  Surgeon:  Albertine Patricia, DPM;  Location: [facility] ORS;  Service: Podiatry;  Laterality: Bilateral;  . AMPUTATION Right 12/24/2017   Procedure: AMPUTATION RAY;  Surgeon: Sharlotte Alamo, DPM;  Location: [facility] ORS;  Service: Podiatry;  Laterality: Right;  . AMPUTATION Left 04/06/2018   Procedure: AMPUTATION RAY;  Surgeon: Sharlotte Alamo, DPM;  Location: [facility] OR[facility];  Service: Podiatry;  Laterality: Left;  . AMPUTATION Left 04/09/2018   Procedure: AMPUTATION RAY;  Surgeon: Sharlotte Alamo, DPM;  Location: [facility] OR[facility];  Service: Podiatry;  Laterality: Left;  . APPLICATION OF WOUND VAC Right 12/24/2017   Procedure: APPLICATION OF WOUND VAC;  Surgeon: Sharlotte Alamo, DPM;  Location: [facility] OR[facility];  Service: Podiatry;  Laterality: Right;  . APPLICATION OF WOUND VAC Right 01/01/2018   Procedure: APPLICATION OF WOUND VAC;  Surgeon: Albertine Patricia, DPM;  Location: [facility] OR[facility];  Service: Podiatry;  Laterality: Right;  . BONE EXCISION Bilateral 06/16/2018   Procedure: BONE EXCISION AND SOFT TISSUE;  Surgeon: Albertine Patricia, DPM;  Location: [facility] OR[facility];  Service: Podiatry;  Laterality: Bilateral;  . IRRIGATION AND DEBRIDEMENT FOOT Right 12/20/2017   Procedure: IRRIGATION AND DEBRIDEMENT FOOT;  Surgeon: Sharlotte Alamo, DPM;  Location: [facility] OR[facility];  Service: Podiatry;  Laterality: Right;  . IRRIGATION AND DEBRIDEMENT FOOT Right 12/24/2017   Procedure: IRRIGATION AND DEBRIDEMENT FOOT;  Surgeon: Sharlotte Alamo, DPM;  Location: [facility];  Service: Podiatry;  Laterality: Right;  . IRRIGATION AND DEBRIDEMENT FOOT Right 01/01/2018   Procedure: IRRIGATION AND DEBRIDEMENT FOOT-SKIN,SOFT TISSUE AND BONE;  Surgeon: Albertine Patricia, DPM;  Location: [facility] OR[facility];  Service: Podiatry;  Laterality: Right;  . IRRIGATION AND DEBRIDEMENT FOOT Left 04/06/2018   Procedure: IRRIGATION AND DEBRIDEMENT FOOT;  Surgeon: Sharlotte Alamo, DPM;  Location: [facility] OR[facility];  Service: Podiatry;  Laterality: Left;  . IRRIGATION AND DEBRIDEMENT FOOT Right 09/20/2018   Procedure: IRRIGATION AND DEBRIDEMENT FOOT;   Surgeon: Sharlotte Alamo, DPM;  Location: [facility];  Service: Podiatry;  Laterality: Right;  . LOWER EXTREMITY ANGIOGRAPHY Left 04/06/2018   Procedure: Lower Extremity Angiography;  Surgeon: Algernon [place], MD;  Location: [place] [facility];  Service: Cardiovascular;  Laterality: Left;     Prior to Admission medications   Medication Sig Start Date End Date Taking? Authorizing Provider  ALPRAZolam (XANAX) 0.25 MG tablet Take 1 tablet (0.25 mg total) by mouth 3 (three) times daily as needed for anxiety. 08/18/18  Yes Gladstone Lighter, MD  amoxicillin-clavulanate (AUGMENTIN) 875-125 MG tablet Take 1 tablet by mouth 2 (two) times daily. 09/21/18  Yes Bettey Costa, MD  calcium carbonate (CALCIUM 600) 600 MG TABS tablet Take 1 tablet (600 mg total) by mouth 2 (two) times daily with a meal for 30 days. 09/13/18 10/13/18 Yes Milinda Pointer, MD  Cyanocobalamin (VITAMIN B-12) 5000 MCG SUBL Place 1 tablet (5,000 mcg total) under the tongue daily. 09/13/18 12/12/18 Yes Milinda Pointer, MD  diphenoxylate-atropine (LOMOTIL) 2.5-0.025 MG tablet Take 1 tablet by mouth 4 (four) times daily as needed for diarrhea or loose stools.   Yes [provider]  ergocalciferol (VITAMIN D2) 1.25 MG (50000 UT) capsule Take 1 capsule (50,000 Units total) by mouth 2 (two) times a week. X 6 weeks. Patient taking differently: Take 50,000 Units by mouth 2 (two) times a week. X 6 weeks. Take on Monday and friday 09/14/18 10/26/18 Yes Milinda Pointer, MD  ferrous sulfate 325 (65 FE) MG tablet Take 1 tablet (325 mg total) by mouth 2 (  two) times daily with a meal. 01/03/18  Yes Sainani, Belia Heman, MD  gabapentin (NEURONTIN) 300 MG capsule Take 1 capsule (300 mg total) by mouth 2 (two) times daily. 09/21/18  Yes Mody, Ulice Bold, MD  insulin aspart (NOVOLOG) 100 UNIT/ML injection Inject 4 Units into the skin 3 (three) times daily with meals. 08/18/18  Yes Gladstone Lighter, MD  insulin glargine (LANTUS) 100 UNIT/ML injection Inject 0.3 mLs  (30 Units total) into the skin daily. Patient taking differently: Inject 35 Units into the skin 2 (two) times daily.  08/18/18  Yes Gladstone Lighter, MD  Insulin Syringes, Disposable, U-100 0.3 ML MISC 1 Syringe by Does not apply route 4 (four) times daily -  with meals and at bedtime. 12/27/17  Yes Wieting, Richard, MD  lisinopril (PRINIVIL,ZESTRIL) 10 MG tablet Take 1 tablet (10 mg total) by mouth daily. 08/18/18  Yes Gladstone Lighter, MD  oxyCODONE-acetaminophen (PERCOCET) 10-325 MG tablet Take 1 tablet by mouth every 4 (four) hours as needed for up to 3 days for pain. 09/21/18 09/24/18 Yes Bettey Costa, MD  promethazine (PHENERGAN) 25 MG tablet Take 1 tablet (25 mg total) by mouth every 6 (six) hours as needed for nausea or vomiting. 08/18/18  Yes Gladstone Lighter, MD  vitamin C (VITAMIN C) 250 MG tablet Take 1 tablet (250 mg total) by mouth 2 (two) times daily. 06/20/18  Yes Gladstone Lighter, MD  zolpidem (AMBIEN) 10 MG tablet Take 10 mg by mouth at bedtime.    Yes [provider]  Cholecalciferol (VITAMIN D3) 125 MCG (5000 UT) CAPS Take 1 capsule (5,000 Units total) by mouth daily with breakfast. Take along with calcium and magnesium. 09/13/18 03/12/19  Milinda Pointer, MD  levofloxacin (LEVAQUIN) 750 MG tablet Take 1 tablet (750 mg total) by mouth daily. 09/24/18   Carrie Mew, MD     Allergies Banana; Keflex [cephalexin]; Sulfa antibiotics; Grapeseed extract [nutritional supplements]; Shellfish allergy; and Grape seed   Family History  Problem Relation Age of Onset  . Diabetes Mother   . Ovarian cancer Mother   . Healthy Father     Social History Social History   Tobacco Use  . Smoking status: Never Smoker  . Smokeless tobacco: Never Used  Substance Use Topics  . Alcohol use: No    Frequency: Never  . Drug use: Yes    Types: Marijuana, PCP    Review of Systems  Constitutional:   No fever or chills.  ENT:   No sore throat. No rhinorrhea. Cardiovascular:    No chest pain or syncope. Respiratory:   No dyspnea or cough. Gastrointestinal:   Negative for abdominal pain, vomiting and diarrhea.  Musculoskeletal:   Right foot pain as above.  Nonradiating.  Severe. All other systems reviewed and are negative except as documented above in ROS and HPI.  ____________________________________________   PHYSICAL EXAM:  VITAL SIGNS: ED Triage Vitals [09/24/18 1757]  Enc Vitals Group     BP (!) 126/99     Pulse Rate (!) 115     Resp 16     Temp (!) 97.4 F (36.3 C)     Temp Source Oral     SpO2 98 %     Weight      Height      Head Circumference      Peak Flow      Pain Score 10     Pain Loc      Pain Edu?      Excl. in [place]?  Vital signs reviewed, nursing assessments reviewed.   Constitutional:   Alert and oriented. Non-toxic appearance. Eyes:   Conjunctivae are normal. EOMI. PERRL. ENT      Head:   Normocephalic and atraumatic.      Nose:   No congestion/rhinnorhea.       Mouth/Throat:   MMM, no pharyngeal erythema. No peritonsillar mass.       Neck:   No meningismus. Full ROM. Hematological/Lymphatic/Immunilogical:   No cervical lymphadenopathy. Cardiovascular:   Tachycardia heart rate 100. Symmetric bilateral radial and DP pulses.  No murmurs. Cap refill less than 2 seconds. Respiratory:   Normal respiratory effort without tachypnea/retractions. Breath sounds are clear and equal bilaterally. No wheezes/rales/rhonchi. Gastrointestinal:   Soft and nontender. Non distended. There is no CVA tenderness.  No rebound, rigidity, or guarding.  Musculoskeletal:   Normal range of motion in all extremities. No joint effusions.  No lower extremity tenderness.  No edema.  Plantar aspect of right foot reveals 2 areas recently debrided.  One is at the head of the first metatarsal, soft nontender without induration or inflammatory changes or fluctuance.  Appears to be healing well and noninfected.  Second area is at the plantar aspect of the right  heel.  This area was deeply debrided to remove the eschar that is evident on previous media image in the chart.  Antibiotic beads are in place.  The wound is soft without inflammatory changes or drainage.  Appears uninfected and healing well.  Neurologic:   Normal speech and language.  Motor grossly intact. No acute focal neurologic deficits are appreciated.  Skin:    Skin is warm, dry and intact. No rash noted.  No petechiae, purpura, or bullae.  Evidence of track marks from possible prior IV drug use. ____________________________________________    LABS (pertinent positives/negatives) (all labs ordered are listed, but only abnormal results are displayed) Labs Reviewed  GLUCOSE, CAPILLARY - Abnormal; Notable for the following components:      Result Value   Glucose-Capillary 471 (*)    All other components within normal limits  COMPREHENSIVE METABOLIC PANEL - Abnormal; Notable for the following components:   Sodium 129 (*)    Glucose, Bld 567 (*)    BUN 30 (*)    Creatinine, Ser 2.63 (*)    Calcium 7.8 (*)    Total Protein 6.0 (*)    Albumin 2.2 (*)    GFR calc non Af Amer 31 (*)    GFR calc Af Amer 36 (*)    All other components within normal limits  CBC WITH DIFFERENTIAL/PLATELET - Abnormal; Notable for the following components:   WBC 13.8 (*)    RBC 3.14 (*)    Hemoglobin 8.4 (*)    HCT 27.1 (*)    Platelets 492 (*)    Neutro Abs 9.8 (*)    Monocytes Absolute 1.3 (*)    Abs Immature Granulocytes 0.15 (*)    All other components within normal limits   ____________________________________________   EKG    ____________________________________________    RADIOLOGY  No results found.  ____________________________________________   PROCEDURES Procedures  ____________________________________________    CLINICAL IMPRESSION / ASSESSMENT AND PLAN / ED COURSE  Medications ordered in the ED: Medications  sodium chloride 0.9 % bolus 2,000 mL (has no  administration in time range)  insulin aspart (novoLOG) injection 10 Units (has no administration in time range)  ketorolac (TORADOL) 30 MG/ML injection 15 mg (has no administration in time range)  ondansetron (ZOFRAN) injection  4 mg (has no administration in time range)  acetaminophen (TYLENOL) tablet 1,000 mg (1,000 mg Oral Given 09/24/18 1945)  levofloxacin (LEVAQUIN) tablet 750 mg (750 mg Oral Given 09/24/18 1945)    Pertinent labs & imaging results that were available during my care of the patient were reviewed by me and considered in my medical decision making (see chart for details).    Patient complains of right foot pain and requesting opioid pain medication.  He is nontoxic.  He is tachycardic which I attribute to dehydration from his hyperglycemia.  I do not think he is septic.  I do not see any evidence of recurrent or worsening soft tissue infection.  Review of the culture data shows that his 2 infections would be sensitive to fluoroquinolones or Bactrim, but he has a severe sulfa allergy, I will start him on Levaquin, give 2 L IV fluids for hydration, 10 units of IV insulin for glycemic control.  IV Toradol, Tylenol for pain control.   ----------------------------------------- 8:05 PM on 09/24/2018 -----------------------------------------  After being informed the we would not be giving him opioids for pain control in the ED, patient demanded to be discharged immediately and stated that he could just follow-up with his podiatrist tomorrow.  I agree, I do not agree septic or needing immediate intervention.  I confirm with him that he is not under any pain management treatment program right now and recommended he seek referral to pain management for his chronic pain issues.  I advised him that in the ED we will generally avoid giving opioids to worsen long-term opioid dependence and risk of overdose.  Patient does not wish to have insulin or IV fluids.  He is not acidemic, but I will  discharge him AGAINST MEDICAL ADVICE given that his treatment is not yet complete.  He does have medical decision-making capacity, future oriented, linear thought, no delusions or suicidality or hallucinations.      ____________________________________________   FINAL CLINICAL IMPRESSION(S) / ED DIAGNOSES    Final diagnoses:  Right foot pain  Dehydration  Hyperglycemia     ED Discharge Orders         Ordered    levofloxacin (LEVAQUIN) 750 MG tablet  Daily     09/24/18 1959          Portions of this note were generated with dragon dictation software. Dictation errors may occur despite best attempts at proofreading.   Carrie Mew, MD 09/24/18 2007

## 2018-09-24 NOTE — ED Notes (Signed)
Pt currently on augmentin for his foot wound and has an appt with dr cline tomorrow. Pt here d/t diarrhea, nausea and c/o needing pain medication.

## 2018-09-24 NOTE — ED Notes (Addendum)
Pt's backpack moved to the bench. Pt states he has food and drink in the bag so advised not to eat since his sugar is high. pt upset that he is not getting anything other than tylenol and toradol. Attempted 2nd iv without success. Pt states he has pain meds at home and will need to call a ride when he leaves.

## 2018-09-25 LAB — AEROBIC/ANAEROBIC CULTURE W GRAM STAIN (SURGICAL/DEEP WOUND)

## 2018-09-26 NOTE — Addendum Note (Signed)
Addendum  created 09/26/18 1030 by Doreen Salvage, CRNA   Charge Capture section accepted

## 2018-10-10 ENCOUNTER — Telehealth (INDEPENDENT_AMBULATORY_CARE_PROVIDER_SITE_OTHER): Payer: Self-pay

## 2018-10-10 NOTE — Telephone Encounter (Signed)
Patient called back stating that he was having nausea, vomiting and diarrhea. I explained that his procedure will be canceled and he will need to see his PCP to be cleared to have his procedure. The patient got irrate and proceeded to say curse words, I stated to not curse at me and he apologized. The patient again started cursing and stating that if he die it will be my fault and hung up. The patient was upset because he stated he did not have the money to pay his PCP and didn't want to take the time to drive to [place] to be seen. Patient stated he would go to the ER instead, I explained that he would still need to see his PCP.

## 2018-10-10 NOTE — Telephone Encounter (Signed)
I attempted to contact the patient and was unable to leave a message due to the voicemail being full. I then contacted the patient's mother and she gave me the number [phone] to call the patient and I had to leave a message for a callback.

## 2018-10-11 ENCOUNTER — Other Ambulatory Visit: Payer: Self-pay

## 2018-10-11 ENCOUNTER — Encounter
Admission: RE | Disposition: A | Discharge status: Home / Self Care | Payer: Self-pay | Source: Home / Self Care | Attending: Internal Medicine

## 2018-10-11 ENCOUNTER — Ambulatory Visit: Admission: RE | Admit: 2018-10-11 | Payer: Medicaid Other | Source: Home / Self Care | Admitting: Vascular Surgery

## 2018-10-11 ENCOUNTER — Inpatient Hospital Stay
Admission: RE | Admit: 2018-10-11 | Discharge: 2018-10-13 | DRG: 638 | Disposition: A | Discharge status: Home / Self Care | Payer: Medicaid Other | Attending: Internal Medicine | Admitting: Internal Medicine

## 2018-10-11 ENCOUNTER — Encounter: Admission: RE | Payer: Self-pay | Source: Home / Self Care

## 2018-10-11 DIAGNOSIS — E11621 Type 2 diabetes mellitus with foot ulcer: Secondary | ICD-10-CM | POA: Diagnosis present

## 2018-10-11 DIAGNOSIS — N183 Chronic kidney disease, stage 3 (moderate): Secondary | ICD-10-CM | POA: Diagnosis present

## 2018-10-11 DIAGNOSIS — I129 Hypertensive chronic kidney disease with stage 1 through stage 4 chronic kidney disease, or unspecified chronic kidney disease: Secondary | ICD-10-CM | POA: Diagnosis present

## 2018-10-11 DIAGNOSIS — Z8041 Family history of malignant neoplasm of ovary: Secondary | ICD-10-CM | POA: Diagnosis not present

## 2018-10-11 DIAGNOSIS — I70299 Other atherosclerosis of native arteries of extremities, unspecified extremity: Secondary | ICD-10-CM

## 2018-10-11 DIAGNOSIS — L97416 Non-pressure chronic ulcer of right heel and midfoot with bone involvement without evidence of necrosis: Secondary | ICD-10-CM | POA: Diagnosis not present

## 2018-10-11 DIAGNOSIS — Z794 Long term (current) use of insulin: Secondary | ICD-10-CM

## 2018-10-11 DIAGNOSIS — L97929 Non-pressure chronic ulcer of unspecified part of left lower leg with unspecified severity: Secondary | ICD-10-CM | POA: Diagnosis present

## 2018-10-11 DIAGNOSIS — M869 Osteomyelitis, unspecified: Secondary | ICD-10-CM | POA: Diagnosis present

## 2018-10-11 DIAGNOSIS — I70234 Atherosclerosis of native arteries of right leg with ulceration of heel and midfoot: Secondary | ICD-10-CM

## 2018-10-11 DIAGNOSIS — E1165 Type 2 diabetes mellitus with hyperglycemia: Secondary | ICD-10-CM | POA: Diagnosis present

## 2018-10-11 DIAGNOSIS — Z833 Family history of diabetes mellitus: Secondary | ICD-10-CM

## 2018-10-11 DIAGNOSIS — K580 Irritable bowel syndrome with diarrhea: Secondary | ICD-10-CM | POA: Diagnosis present

## 2018-10-11 DIAGNOSIS — R739 Hyperglycemia, unspecified: Secondary | ICD-10-CM | POA: Diagnosis present

## 2018-10-11 DIAGNOSIS — E1169 Type 2 diabetes mellitus with other specified complication: Secondary | ICD-10-CM | POA: Diagnosis present

## 2018-10-11 DIAGNOSIS — E1152 Type 2 diabetes mellitus with diabetic peripheral angiopathy with gangrene: Secondary | ICD-10-CM | POA: Diagnosis present

## 2018-10-11 DIAGNOSIS — E1122 Type 2 diabetes mellitus with diabetic chronic kidney disease: Secondary | ICD-10-CM | POA: Diagnosis present

## 2018-10-11 DIAGNOSIS — L97519 Non-pressure chronic ulcer of other part of right foot with unspecified severity: Secondary | ICD-10-CM | POA: Diagnosis present

## 2018-10-11 DIAGNOSIS — L97909 Non-pressure chronic ulcer of unspecified part of unspecified lower leg with unspecified severity: Secondary | ICD-10-CM

## 2018-10-11 DIAGNOSIS — I70222 Atherosclerosis of native arteries of extremities with rest pain, left leg: Secondary | ICD-10-CM | POA: Diagnosis not present

## 2018-10-11 DIAGNOSIS — E1151 Type 2 diabetes mellitus with diabetic peripheral angiopathy without gangrene: Secondary | ICD-10-CM

## 2018-10-11 DIAGNOSIS — Z79891 Long term (current) use of opiate analgesic: Secondary | ICD-10-CM | POA: Diagnosis not present

## 2018-10-11 DIAGNOSIS — L97419 Non-pressure chronic ulcer of right heel and midfoot with unspecified severity: Secondary | ICD-10-CM

## 2018-10-11 DIAGNOSIS — Z91013 Allergy to seafood: Secondary | ICD-10-CM

## 2018-10-11 DIAGNOSIS — Z79899 Other long term (current) drug therapy: Secondary | ICD-10-CM

## 2018-10-11 DIAGNOSIS — K219 Gastro-esophageal reflux disease without esophagitis: Secondary | ICD-10-CM | POA: Diagnosis present

## 2018-10-11 LAB — GLUCOSE, CAPILLARY
Glucose-Capillary: 577 mg/dL (ref 70–99)
Glucose-Capillary: >600 mg/dL (ref 70–99)
Glucose-Capillary: 524 mg/dL (ref 70–99)
Glucose-Capillary: 405 mg/dL — ABNORMAL HIGH (ref 70–99)
Glucose-Capillary: 237 mg/dL — ABNORMAL HIGH (ref 70–99)
Glucose-Capillary: 196 mg/dL — ABNORMAL HIGH (ref 70–99)
Glucose-Capillary: 406 mg/dL — ABNORMAL HIGH (ref 70–99)
Glucose-Capillary: 378 mg/dL — ABNORMAL HIGH (ref 70–99)
Glucose-Capillary: 361 mg/dL — ABNORMAL HIGH (ref 70–99)

## 2018-10-11 LAB — BASIC METABOLIC PANEL
Anion gap: 11 (ref 5–15)
BUN: 57 mg/dL — ABNORMAL HIGH (ref 6–20)
CO2: 20 mmol/L — ABNORMAL LOW (ref 22–32)
Calcium: 8.4 mg/dL — ABNORMAL LOW (ref 8.9–10.3)
Chloride: 100 mmol/L (ref 98–111)
Creatinine, Ser: 2.45 mg/dL — ABNORMAL HIGH (ref 0.61–1.24)
GFR calc Af Amer: 39 mL/min — ABNORMAL LOW (ref >60)
GFR calc non Af Amer: 34 mL/min — ABNORMAL LOW (ref >60)
Glucose, Bld: 669 mg/dL (ref 70–99)
Potassium: 3.7 mmol/L (ref 3.5–5.1)
Sodium: 131 mmol/L — ABNORMAL LOW (ref 135–145)

## 2018-10-11 LAB — GLUCOSE, RANDOM: Glucose, Bld: 445 mg/dL — ABNORMAL HIGH (ref 70–99)

## 2018-10-11 SURGERY — LOWER EXTREMITY ANGIOGRAPHY
Anesthesia: Moderate Sedation | Site: Leg Lower | Laterality: Right

## 2018-10-11 SURGERY — LOWER EXTREMITY ANGIOGRAPHY
Anesthesia: Moderate Sedation | Laterality: Right

## 2018-10-11 MED ORDER — SODIUM CHLORIDE 0.9 % IV SOLN
INTRAVENOUS | Status: DC
  Administered 2018-10-11: 1000 mL via INTRAVENOUS

## 2018-10-11 MED ORDER — HEPARIN (PORCINE) IN NACL 1000-0.9 UT/500ML-% IV SOLN
INTRAVENOUS | Status: AC
  Filled 2018-10-11: qty 1000

## 2018-10-11 MED ORDER — INSULIN ASPART 100 UNIT/ML ~~LOC~~ SOLN
0.0000 [IU] | Freq: Every day | SUBCUTANEOUS | Status: DC
  Administered 2018-10-11: 5 [IU] via SUBCUTANEOUS
  Filled 2018-10-11: qty 1

## 2018-10-11 MED ORDER — INSULIN ASPART 100 UNIT/ML ~~LOC~~ SOLN
SUBCUTANEOUS | Status: AC
  Filled 2018-10-11: qty 1

## 2018-10-11 MED ORDER — FAMOTIDINE 20 MG PO TABS
ORAL_TABLET | ORAL | Status: AC
  Administered 2018-10-11: 09:00:00 40 mg via ORAL
  Filled 2018-10-11: qty 2

## 2018-10-11 MED ORDER — OXYCODONE-ACETAMINOPHEN 5-325 MG PO TABS
ORAL_TABLET | ORAL | Status: AC
  Filled 2018-10-11: qty 2

## 2018-10-11 MED ORDER — METHYLPREDNISOLONE SODIUM SUCC 125 MG IJ SOLR
INTRAMUSCULAR | Status: AC
  Administered 2018-10-11: 09:00:00 125 mg via INTRAVENOUS
  Filled 2018-10-11: qty 2

## 2018-10-11 MED ORDER — ENOXAPARIN SODIUM 30 MG/0.3ML ~~LOC~~ SOLN
30.0000 mg | SUBCUTANEOUS | Status: DC
  Filled 2018-10-11: qty 0.3

## 2018-10-11 MED ORDER — HYDROMORPHONE HCL 1 MG/ML IJ SOLN: 1.0000 mg | Freq: Once | INTRAMUSCULAR | Status: DC | PRN

## 2018-10-11 MED ORDER — DEXTROSE 50 % IV SOLN: 25.0000 mL | INTRAVENOUS | Status: DC | PRN

## 2018-10-11 MED ORDER — INSULIN ASPART 100 UNIT/ML ~~LOC~~ SOLN
SUBCUTANEOUS | Status: AC
  Administered 2018-10-11: 11:00:00 15 [IU] via SUBCUTANEOUS
  Filled 2018-10-11: qty 1

## 2018-10-11 MED ORDER — OXYCODONE-ACETAMINOPHEN 5-325 MG PO TABS
1.0000 | ORAL_TABLET | ORAL | Status: DC | PRN
  Administered 2018-10-11: 2 via ORAL

## 2018-10-11 MED ORDER — OXYCODONE-ACETAMINOPHEN 5-325 MG PO TABS
1.0000 | ORAL_TABLET | ORAL | Status: DC | PRN
  Administered 2018-10-11: 2 via ORAL
  Administered 2018-10-11 – 2018-10-12 (×2): 1 via ORAL
  Administered 2018-10-12: 11:00:00 2 via ORAL
  Administered 2018-10-12: 1 via ORAL
  Administered 2018-10-12 – 2018-10-13 (×3): 2 via ORAL
  Filled 2018-10-11 (×4): qty 2
  Filled 2018-10-11 (×2): qty 1
  Filled 2018-10-11: qty 2
  Filled 2018-10-11: qty 1

## 2018-10-11 MED ORDER — ACETAMINOPHEN 650 MG RE SUPP
650.0000 mg | Freq: Four times a day (QID) | RECTAL | Status: DC | PRN
  Filled 2018-10-11: qty 1

## 2018-10-11 MED ORDER — ALBUTEROL SULFATE (2.5 MG/3ML) 0.083% IN NEBU
2.5000 mg | INHALATION_SOLUTION | RESPIRATORY_TRACT | Status: DC | PRN
  Filled 2018-10-11: qty 3

## 2018-10-11 MED ORDER — CLINDAMYCIN PHOSPHATE 300 MG/50ML IV SOLN
INTRAVENOUS | Status: AC
  Filled 2018-10-11: qty 50

## 2018-10-11 MED ORDER — INSULIN GLARGINE 100 UNIT/ML ~~LOC~~ SOLN
40.0000 [IU] | Freq: Two times a day (BID) | SUBCUTANEOUS | Status: DC
  Administered 2018-10-11 (×2): 40 [IU] via SUBCUTANEOUS
  Filled 2018-10-11 (×4): qty 0.4

## 2018-10-11 MED ORDER — LIDOCAINE HCL (PF) 1 % IJ SOLN
INTRAMUSCULAR | Status: AC
  Filled 2018-10-11: qty 30

## 2018-10-11 MED ORDER — SODIUM CHLORIDE 0.9 % IV BOLUS
1000.0000 mL | Freq: Once | INTRAVENOUS | Status: AC
  Administered 2018-10-11: 11:00:00 1000 mL via INTRAVENOUS

## 2018-10-11 MED ORDER — CLINDAMYCIN PHOSPHATE 300 MG/50ML IV SOLN: 300.0000 mg | INTRAVENOUS | Status: DC

## 2018-10-11 MED ORDER — DIPHENHYDRAMINE HCL 50 MG/ML IJ SOLN
50.0000 mg | Freq: Once | INTRAMUSCULAR | Status: AC | PRN
  Administered 2018-10-11: 25 mg via INTRAVENOUS

## 2018-10-11 MED ORDER — SENNA 8.6 MG PO TABS
1.0000 | ORAL_TABLET | Freq: Two times a day (BID) | ORAL | Status: DC
  Administered 2018-10-12 – 2018-10-13 (×2): 8.6 mg via ORAL
  Filled 2018-10-11 (×5): qty 1

## 2018-10-11 MED ORDER — INSULIN ASPART 100 UNIT/ML ~~LOC~~ SOLN
15.0000 [IU] | Freq: Once | SUBCUTANEOUS | Status: AC
  Administered 2018-10-11: 11:00:00 15 [IU] via SUBCUTANEOUS

## 2018-10-11 MED ORDER — INSULIN ASPART 100 UNIT/ML ~~LOC~~ SOLN
10.0000 [IU] | SUBCUTANEOUS | Status: DC | PRN
  Administered 2018-10-11 (×2): 10 [IU] via SUBCUTANEOUS

## 2018-10-11 MED ORDER — POLYETHYLENE GLYCOL 3350 17 G PO PACK
17.0000 g | PACK | Freq: Every day | ORAL | Status: DC | PRN
  Filled 2018-10-11: qty 1

## 2018-10-11 MED ORDER — DIPHENOXYLATE-ATROPINE 2.5-0.025 MG PO TABS
1.0000 | ORAL_TABLET | Freq: Four times a day (QID) | ORAL | Status: DC | PRN
  Administered 2018-10-12: 1 via ORAL
  Filled 2018-10-11: qty 1

## 2018-10-11 MED ORDER — GABAPENTIN 300 MG PO CAPS
300.0000 mg | ORAL_CAPSULE | Freq: Two times a day (BID) | ORAL | Status: DC
  Administered 2018-10-11 – 2018-10-13 (×5): 300 mg via ORAL
  Filled 2018-10-11 (×5): qty 1

## 2018-10-11 MED ORDER — MORPHINE SULFATE (PF) 4 MG/ML IV SOLN
2.0000 mg | INTRAVENOUS | Status: DC | PRN
  Administered 2018-10-11 – 2018-10-13 (×9): 2 mg via INTRAVENOUS
  Filled 2018-10-11 (×9): qty 1

## 2018-10-11 MED ORDER — ONDANSETRON HCL 4 MG/2ML IJ SOLN
INTRAMUSCULAR | Status: AC
  Filled 2018-10-11: qty 2

## 2018-10-11 MED ORDER — MIDAZOLAM HCL 2 MG/ML PO SYRP: 8.0000 mg | ORAL_SOLUTION | Freq: Once | ORAL | Status: DC | PRN

## 2018-10-11 MED ORDER — INSULIN ASPART 100 UNIT/ML ~~LOC~~ SOLN
0.0000 [IU] | Freq: Three times a day (TID) | SUBCUTANEOUS | Status: DC
  Administered 2018-10-11: 18:00:00 4 [IU] via SUBCUTANEOUS
  Administered 2018-10-11: 7 [IU] via SUBCUTANEOUS
  Filled 2018-10-11 (×2): qty 1

## 2018-10-11 MED ORDER — ONDANSETRON HCL 4 MG PO TABS
4.0000 mg | ORAL_TABLET | Freq: Four times a day (QID) | ORAL | Status: DC | PRN
  Filled 2018-10-11 (×2): qty 1

## 2018-10-11 MED ORDER — FAMOTIDINE 20 MG PO TABS
40.0000 mg | ORAL_TABLET | Freq: Once | ORAL | Status: AC | PRN
  Administered 2018-10-11: 09:00:00 40 mg via ORAL

## 2018-10-11 MED ORDER — SODIUM CHLORIDE 0.9 % IV SOLN
INTRAVENOUS | Status: DC
  Administered 2018-10-11 – 2018-10-12 (×2): via INTRAVENOUS

## 2018-10-11 MED ORDER — ONDANSETRON HCL 4 MG/2ML IJ SOLN
4.0000 mg | Freq: Four times a day (QID) | INTRAMUSCULAR | Status: DC | PRN
  Administered 2018-10-11 – 2018-10-12 (×2): 4 mg via INTRAVENOUS
  Filled 2018-10-11 (×2): qty 2

## 2018-10-11 MED ORDER — ACETAMINOPHEN 325 MG PO TABS
650.0000 mg | ORAL_TABLET | Freq: Four times a day (QID) | ORAL | Status: DC | PRN
  Filled 2018-10-11: qty 2

## 2018-10-11 MED ORDER — METHYLPREDNISOLONE SODIUM SUCC 125 MG IJ SOLR
125.0000 mg | Freq: Once | INTRAMUSCULAR | Status: AC | PRN
  Administered 2018-10-11: 125 mg via INTRAVENOUS

## 2018-10-11 MED ORDER — ENOXAPARIN SODIUM 40 MG/0.4ML ~~LOC~~ SOLN
40.0000 mg | SUBCUTANEOUS | Status: DC
  Administered 2018-10-11: 22:00:00 40 mg via SUBCUTANEOUS
  Filled 2018-10-11: qty 0.4

## 2018-10-11 MED ORDER — HYDRALAZINE HCL 20 MG/ML IJ SOLN: 10.0000 mg | Freq: Four times a day (QID) | INTRAMUSCULAR | Status: DC | PRN

## 2018-10-11 MED ORDER — ALPRAZOLAM 0.25 MG PO TABS
0.2500 mg | ORAL_TABLET | Freq: Three times a day (TID) | ORAL | Status: DC | PRN
  Administered 2018-10-11 – 2018-10-13 (×5): 0.25 mg via ORAL
  Filled 2018-10-11 (×6): qty 1

## 2018-10-11 MED ORDER — ZOLPIDEM TARTRATE 5 MG PO TABS
10.0000 mg | ORAL_TABLET | Freq: Every day | ORAL | Status: DC
  Administered 2018-10-11 – 2018-10-12 (×2): 10 mg via ORAL
  Filled 2018-10-11 (×2): qty 2

## 2018-10-11 MED ORDER — DIPHENHYDRAMINE HCL 50 MG/ML IJ SOLN
INTRAMUSCULAR | Status: AC
  Administered 2018-10-11: 09:00:00 25 mg via INTRAVENOUS
  Filled 2018-10-11: qty 1

## 2018-10-11 MED ORDER — PROMETHAZINE HCL 25 MG/ML IJ SOLN
12.5000 mg | Freq: Three times a day (TID) | INTRAMUSCULAR | Status: DC | PRN
  Administered 2018-10-11 – 2018-10-13 (×3): 12.5 mg via INTRAVENOUS
  Filled 2018-10-11 (×4): qty 1

## 2018-10-11 MED ORDER — ONDANSETRON HCL 4 MG/2ML IJ SOLN
4.0000 mg | Freq: Four times a day (QID) | INTRAMUSCULAR | Status: DC | PRN
  Administered 2018-10-11: 11:00:00 4 mg via INTRAVENOUS

## 2018-10-11 MED ORDER — INSULIN ASPART 100 UNIT/ML ~~LOC~~ SOLN
SUBCUTANEOUS | Status: AC
  Administered 2018-10-11: 10:00:00 10 [IU] via SUBCUTANEOUS
  Filled 2018-10-11: qty 1

## 2018-10-11 NOTE — Progress Notes (Signed)
MD notified: The patient was just admitted to the unit. He is refusing a bed alarm. He has been educated about the importance of having one yet conitnues to refuse. He indicates he has been admitted before and does not want the bed alarm. He had a fall about three weeks ago. Lastly do you want the patient on any IV fluids?Shawn [name]

## 2018-10-11 NOTE — Progress Notes (Signed)
PHARMACIST - PHYSICIAN COMMUNICATION  CONCERNING:  Enoxaparin (Lovenox) for DVT Prophylaxis    RECOMMENDATION: Patient was prescribed enoxaprin 30mg  q24 hours for VTE prophylaxis.   Filed Weights   10/11/18 0751  Weight: 120 lb (54.4 kg)    Body mass index is 19.37 kg/m.  Estimated Creatinine Clearance: 33.6 mL/min (A) (by C-G formula based on SCr of 2.45 mg/dL (H)).   Patient is candidate for enoxaparin 40mg  every 24 hours based on CrCl >3ml/min AND Weight > than 50kg for male  DESCRIPTION: Pharmacy has adjusted enoxaparin dose per [facility] policy.  Patient is now receiving enoxaparin 40mg  every 24 hours.   Lu Duffel, PharmD, BCPS Clinical Pharmacist 10/11/2018 2:16 PM\

## 2018-10-11 NOTE — Progress Notes (Signed)
Patient will be admitted for uncontrolled diabetes mellitus with hyperglycemia greater than 600.  Not in DKA.  Has not taken his morning dose of Lantus.  At this time we will give him Lantus 40 units and NovoLog 15 units subcutaneous along with fluid bolus.  If blood sugars are improved he can be admitted to medical floor.  If not improved will have to admit him to stepdown.  Will reassess in 1 hour.

## 2018-10-11 NOTE — H&P (Signed)
@LOGO @   MRN : [id number]  Shawn [name] is a 33 y.o. (15-Nov-1985) male who presents with chief complaint of ulcer right heel.  History of Present Illness:  The patient is seen for evaluation of painful lower extremities and diminished pulses associated with ulceration of the foot.  The patient notes the ulcer has been present for just a week or two and has not been improving but increasing in size.  It is very painful and has had some drainage.  He was seen on Monday by Dr Cleda Mccreedy who is concerned he may need a BKA.  No specific history of trauma noted by the patient.  The patient denies fever or chills.  The patient does have diabetes which has been difficult to control.  The patient has rest pain. There have been prior interventions and angiograms.  No history of back problems or DJD of the lumbar sacral spine.   The patient denies amaurosis fugax or recent TIA symptoms. There are no recent neurological changes noted. The patient denies history of DVT, PE or superficial thrombophlebitis. The patient denies recent episodes of angina or shortness of breath.   Current Meds  Medication Sig  . ALPRAZolam (XANAX) 0.25 MG tablet Take 1 tablet (0.25 mg total) by mouth 3 (three) times daily as needed for anxiety.  . calcium carbonate (CALCIUM 600) 600 MG TABS tablet Take 1 tablet (600 mg total) by mouth 2 (two) times daily with a meal for 30 days.  . Cholecalciferol (VITAMIN D3) 125 MCG (5000 UT) CAPS Take 1 capsule (5,000 Units total) by mouth daily with breakfast. Take along with calcium and magnesium.  . Cyanocobalamin (VITAMIN B-12) 5000 MCG SUBL Place 1 tablet (5,000 mcg total) under the tongue daily.  . diphenoxylate-atropine (LOMOTIL) 2.5-0.025 MG tablet Take 1 tablet by mouth 4 (four) times daily as needed for diarrhea or loose stools.  . ergocalciferol (VITAMIN D2) 1.25 MG (50000 UT) capsule Take 1 capsule (50,000 Units total) by mouth 2 (two) times a week. X 6 weeks. (Patient taking  differently: Take 50,000 Units by mouth 2 (two) times a week. X 6 weeks. Take on Monday and friday)  . ferrous sulfate 325 (65 FE) MG tablet Take 1 tablet (325 mg total) by mouth 2 (two) times daily with a meal.  . gabapentin (NEURONTIN) 300 MG capsule Take 1 capsule (300 mg total) by mouth 2 (two) times daily.  . insulin aspart (NOVOLOG) 100 UNIT/ML injection Inject 4 Units into the skin 3 (three) times daily with meals.  . insulin glargine (LANTUS) 100 UNIT/ML injection Inject 0.3 mLs (30 Units total) into the skin daily. (Patient taking differently: Inject 35 Units into the skin 2 (two) times daily. )  . oxyCODONE-acetaminophen (PERCOCET) 10-325 MG tablet Take 1 tablet by mouth every 6 (six) hours as needed for pain.  . promethazine (PHENERGAN) 25 MG tablet Take 1 tablet (25 mg total) by mouth every 6 (six) hours as needed for nausea or vomiting.  . vitamin C (VITAMIN C) 250 MG tablet Take 1 tablet (250 mg total) by mouth 2 (two) times daily.  Marland Kitchen zolpidem (AMBIEN) 10 MG tablet Take 10 mg by mouth at bedtime.     Past Medical History:  Diagnosis Date  . CKD (chronic kidney disease)   . Diabetes mellitus without complication ([place])   . GERD (gastroesophageal reflux disease)   . HTN (hypertension)   . IBS (irritable bowel syndrome)   . Osteomyelitis [facility])     Past Surgical History:  Procedure Laterality Date  . ABDOMINAL AORTOGRAM W/LOWER EXTREMITY Right 12/23/2017   Procedure: ABDOMINAL AORTOGRAM W/LOWER EXTREMITY;  Surgeon: Katha Cabal, MD;  Location: [place] [facility];  Service: Cardiovascular;  Laterality: Right;  . ACHILLES TENDON SURGERY Bilateral 06/16/2018   Procedure: ACHILLES LENGTHENING/KIDNER;  Surgeon: Albertine Patricia, DPM;  Location: [facility];  Service: Podiatry;  Laterality: Bilateral;  . AMPUTATION Right 12/24/2017   Procedure: AMPUTATION RAY;  Surgeon: Sharlotte Alamo, DPM;  Location: [facility];  Service: Podiatry;  Laterality: Right;  . AMPUTATION Left 04/06/2018    Procedure: AMPUTATION RAY;  Surgeon: Sharlotte Alamo, DPM;  Location: [facility];  Service: Podiatry;  Laterality: Left;  . AMPUTATION Left 04/09/2018   Procedure: AMPUTATION RAY;  Surgeon: Sharlotte Alamo, DPM;  Location: [facility];  Service: Podiatry;  Laterality: Left;  . APPLICATION OF WOUND VAC Right 12/24/2017   Procedure: APPLICATION OF WOUND VAC;  Surgeon: Sharlotte Alamo, DPM;  Location: [facility];  Service: Podiatry;  Laterality: Right;  . APPLICATION OF WOUND VAC Right 01/01/2018   Procedure: APPLICATION OF WOUND VAC;  Surgeon: Albertine Patricia, DPM;  Location: [facility];  Service: Podiatry;  Laterality: Right;  . BONE EXCISION Bilateral 06/16/2018   Procedure: BONE EXCISION AND SOFT TISSUE;  Surgeon: Albertine Patricia, DPM;  Location: [facility];  Service: Podiatry;  Laterality: Bilateral;  . IRRIGATION AND DEBRIDEMENT FOOT Right 12/20/2017   Procedure: IRRIGATION AND DEBRIDEMENT FOOT;  Surgeon: Sharlotte Alamo, DPM;  Location: [facility];  Service: Podiatry;  Laterality: Right;  . IRRIGATION AND DEBRIDEMENT FOOT Right 12/24/2017   Procedure: IRRIGATION AND DEBRIDEMENT FOOT;  Surgeon: Sharlotte Alamo, DPM;  Location: [facility];  Service: Podiatry;  Laterality: Right;  . IRRIGATION AND DEBRIDEMENT FOOT Right 01/01/2018   Procedure: IRRIGATION AND DEBRIDEMENT FOOT-SKIN,SOFT TISSUE AND BONE;  Surgeon: Albertine Patricia, DPM;  Location: [facility];  Service: Podiatry;  Laterality: Right;  . IRRIGATION AND DEBRIDEMENT FOOT Left 04/06/2018   Procedure: IRRIGATION AND DEBRIDEMENT FOOT;  Surgeon: Sharlotte Alamo, DPM;  Location: [facility];  Service: Podiatry;  Laterality: Left;  . IRRIGATION AND DEBRIDEMENT FOOT Right 09/20/2018   Procedure: IRRIGATION AND DEBRIDEMENT FOOT;  Surgeon: Sharlotte Alamo, DPM;  Location: [facility];  Service: Podiatry;  Laterality: Right;  . LOWER EXTREMITY ANGIOGRAPHY Left 04/06/2018   Procedure: Lower Extremity Angiography;  Surgeon: Algernon Huxley, MD;  Location: [facility];  Service: Cardiovascular;   Laterality: Left;    Social History Social History   Tobacco Use  . Smoking status: Never Smoker  . Smokeless tobacco: Never Used  Substance Use Topics  . Alcohol use: No    Frequency: Never  . Drug use: Yes    Types: Marijuana, PCP    Family History Family History  Problem Relation Age of Onset  . Diabetes Mother   . Ovarian cancer Mother   . Healthy Father     Allergies  Allergen Reactions  . Banana Hives, Nausea And Vomiting and Rash  . Keflex [Cephalexin] Rash    No swelling- also taken penicillin without any issue.  . Sulfa Antibiotics Anaphylaxis  . Grapeseed Extract [Nutritional Supplements] Itching  . Shellfish Allergy Hives    "ALL SEAFOOD"  . Grape Seed Rash     REVIEW OF SYSTEMS (Negative unless checked)  Constitutional: [] Weight loss  [] Fever  [] Chills Cardiac: [] Chest pain   [] Chest pressure   [] Palpitations   [] Shortness of breath when laying flat   [] Shortness of breath with exertion. Vascular:  [x] Pain in legs with walking   [  x]Pain in legs at rest  [] History of DVT   [] Phlebitis   [] Swelling in legs   [] Varicose veins   [x] Non-healing ulcers Pulmonary:   [] Uses home oxygen   [] Productive cough   [] Hemoptysis   [] Wheeze  [] COPD   [] Asthma Neurologic:  [] Dizziness   [] Seizures   [] History of stroke   [] History of TIA  [] Aphasia   [] Vissual changes   [] Weakness or numbness in arm   [] Weakness or numbness in leg Musculoskeletal:   [] Joint swelling   [x] Joint pain   [] Low back pain Hematologic:  [] Easy bruising  [] Easy bleeding   [] Hypercoagulable state   [] Anemic Gastrointestinal:  [] Diarrhea   [] Vomiting  [] Gastroesophageal reflux/heartburn   [] Difficulty swallowing. Genitourinary:  [] Chronic kidney disease   [] Difficult urination  [] Frequent urination   [] Blood in urine Skin:  [] Rashes   [x] Ulcers  Psychological:  [] History of anxiety   []  History of major depression.  Physical Examination  Vitals:   10/11/18 0751  BP: (!) 132/106  Pulse: (!) 106   Resp: 12  Temp: 98.2 F (36.8 C)  TempSrc: Oral  SpO2: 99%  Weight: 54.4 kg  Height: 5\' 6"  (1.676 m)   Body mass index is 19.37 kg/m. Gen: WD/WN, NAD Head: [place]/AT, No temporalis wasting.  Ear/Nose/Throat: Hearing grossly intact, nares w/o erythema or drainage Eyes: PER, EOMI, sclera nonicteric.  Neck: Supple, no large masses.   Pulmonary:  Good air movement, no audible wheezing bilaterally, no use of accessory muscles.  Cardiac: RRR, no JVD Vascular:  Right heel ulcer necrotic Vessel Right Left  Radial Palpable Palpable  Popliteal Palpable Palpable  PT Not Palpable Not Palpable  DP Not Palpable Not Palpable  Gastrointestinal: Non-distended. No guarding/no peritoneal signs.  Musculoskeletal: M/S 5/5 throughout.  No deformity or atrophy.  Neurologic: CN 2-12 intact. Symmetrical.  Speech is fluent. Motor exam as listed above. Psychiatric: Judgment intact, Mood & affect appropriate for pt's clinical situation. Dermatologic: No rashes + right heel ulcer noted.  No changes consistent with cellulitis. Lymph : No lichenification or skin changes of chronic lymphedema.  CBC Lab Results  Component Value Date   WBC 13.8 (H) 09/24/2018   HGB 8.4 (L) 09/24/2018   HCT 27.1 (L) 09/24/2018   MCV 86.3 09/24/2018   PLT 492 (H) 09/24/2018    BMET    Component Value Date/Time   NA 129 (L) 09/24/2018 1815   NA 128 (L) 08/14/2018 1055   NA 132 (L) 10/06/2014 0539   K 4.8 09/24/2018 1815   K 3.4 (L) 10/06/2014 0539   CL 98 09/24/2018 1815   CL 91 (L) 10/06/2014 0539   CO2 23 09/24/2018 1815   CO2 26 10/06/2014 0539   GLUCOSE 567 (HH) 09/24/2018 1815   GLUCOSE 634 (HH) 10/06/2014 0539   BUN 30 (H) 09/24/2018 1815   BUN 25 (H) 08/14/2018 1055   BUN 16 10/06/2014 0539   CREATININE 2.63 (H) 09/24/2018 1815   CREATININE 1.04 10/06/2014 0539   CALCIUM 7.8 (L) 09/24/2018 1815   CALCIUM 10.0 10/06/2014 0539   GFRNONAA 31 (L) 09/24/2018 1815   GFRNONAA >60 10/06/2014 0539   GFRAA 36  (L) 09/24/2018 1815   GFRAA >60 10/06/2014 0539   Estimated Creatinine Clearance: 31.3 mL/min (A) (by C-G formula based on SCr of 2.63 mg/dL (H)).  COAG Lab Results  Component Value Date   INR 0.95 04/21/2018   INR 1.00 12/18/2017    Radiology Dg Foot 2 Views Right  Result Date: 09/19/2018 CLINICAL DATA:  Right foot pain EXAM: RIGHT FOOT - 2 VIEW COMPARISON:  07/03/2018 FINDINGS: Postsurgical changes are noted consistent with amputation of the fourth and fifth digits. The proximal aspect of the fourth metatarsal remains. Changes of prior fracture in the third metatarsal are noted with some callus formation although overall nonunion. No new fracture is seen. There is soft tissue air identified inferiorly consistent with diabetic foot ulcer. No underlying bony erosion is noted. IMPRESSION: Soft tissue wound without underlying bony abnormality. Chronic changes as described above. Electronically Signed   By: Inez Catalina M.D.   On: 09/19/2018 18:45     Assessment/Plan 1.  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  This represents a limb threatening ischemia and places the patient at the risk for limb loss.  Patient should undergo angiography of the lower extremities with the hope for intervention for limb salvage.  The risks and benefits as well as the alternative therapies was discussed in detail with the patient.  All questions were answered.  Patient agrees to proceed with angiography.  The patient will follow up with me in the office after the procedure.   2. Diabetes: Continue hypoglycemic medications as already ordered, these medications have been reviewed and there are no changes at this time.  Hgb A1C to be monitored as already arranged by primary service  3.  GERD: Continue antihypertensive medications  as already ordered, these medications have been reviewed and there are no changes at this time.  Avoidence of caffeine and alcohol  Moderate elevation of the head of the bed      Hortencia Pilar, MD  10/11/2018 8:04 AM

## 2018-10-11 NOTE — Progress Notes (Signed)
[facility]    Communication Note Will plan on angiogram on Friday with Dr. Delana Meyer. Will pre-op Thursday.  Discussed with Dr. Eber Hong Glyndon Tursi PA-C 10/11/2018 1:46 PM

## 2018-10-11 NOTE — H&P (Signed)
[place] [facility] [place] NAME: Shawn [name]    MR#:  [id number]  DATE OF BIRTH:  [birth year]  DATE OF ADMISSION:  10/11/2018  PRIMARY CARE PHYSICIAN: Valera Castle, MD   REQUESTING/REFERRING PHYSICIAN: Dr. Delana Meyer  CHIEF COMPLAINT:  No chief complaint on file. Hyperglycemia  HISTORY OF PRESENT ILLNESS:  Shawn [name]  is a 33 y.o. male with a known history of insulin-dependent diabetes mellitus with uncontrolled blood sugars, CKD stage III, chronic nausea vomiting, chronic right foot ulcers was in specials area for right lower extremity angiogram and found to have blood sugars greater than 600.  Patient was given 2 doses of NovoLog 10 units and blood sugars still high and hospitalist team consulted.  Patient has not taken his Lantus 35 units in the morning.  Also his blood sugars have been running in the 300s.  He sees endocrinology [name].  He has an appointment to follow-up next month. He is on 35 units twice daily Lantus at home and NovoLog 3 units pre-meal.  He also complains of chronic nausea, diarrhea, pain  PAST MEDICAL HISTORY:   Past Medical History:  Diagnosis Date  . CKD (chronic kidney disease)   . Diabetes mellitus without complication ([place])   . GERD (gastroesophageal reflux disease)   . HTN (hypertension)   . IBS (irritable bowel syndrome)   . Osteomyelitis ([place])     PAST SURGICAL HISTORY:   Past Surgical History:  Procedure Laterality Date  . ABDOMINAL AORTOGRAM W/LOWER EXTREMITY Right 12/23/2017   Procedure: ABDOMINAL AORTOGRAM W/LOWER EXTREMITY;  Surgeon: Katha Cabal, MD;  Location: [facility];  Service: Cardiovascular;  Laterality: Right;  . ACHILLES TENDON SURGERY Bilateral 06/16/2018   Procedure: ACHILLES LENGTHENING/KIDNER;  Surgeon: Albertine Patricia, DPM;  Location: [facility];  Service: Podiatry;  Laterality: Bilateral;  . AMPUTATION Right 12/24/2017   Procedure: AMPUTATION RAY;   Surgeon: Sharlotte Alamo, DPM;  Location: [facility];  Service: Podiatry;  Laterality: Right;  . AMPUTATION Left 04/06/2018   Procedure: AMPUTATION RAY;  Surgeon: Sharlotte Alamo, DPM;  Location: [facility];  Service: Podiatry;  Laterality: Left;  . AMPUTATION Left 04/09/2018   Procedure: AMPUTATION RAY;  Surgeon: Sharlotte Alamo, DPM;  Location: [facility];  Service: Podiatry;  Laterality: Left;  . APPLICATION OF WOUND VAC Right 12/24/2017   Procedure: APPLICATION OF WOUND VAC;  Surgeon: Sharlotte Alamo, DPM;  Location: [facility];  Service: Podiatry;  Laterality: Right;  . APPLICATION OF WOUND VAC Right 01/01/2018   Procedure: APPLICATION OF WOUND VAC;  Surgeon: Albertine Patricia, DPM;  Location: [facility];  Service: Podiatry;  Laterality: Right;  . BONE EXCISION Bilateral 06/16/2018   Procedure: BONE EXCISION AND SOFT TISSUE;  Surgeon: Albertine Patricia, DPM;  Location: [facility];  Service: Podiatry;  Laterality: Bilateral;  . IRRIGATION AND DEBRIDEMENT FOOT Right 12/20/2017   Procedure: IRRIGATION AND DEBRIDEMENT FOOT;  Surgeon: Sharlotte Alamo, DPM;  Location: [facility];  Service: Podiatry;  Laterality: Right;  . IRRIGATION AND DEBRIDEMENT FOOT Right 12/24/2017   Procedure: IRRIGATION AND DEBRIDEMENT FOOT;  Surgeon: Sharlotte Alamo, DPM;  Location: [facility];  Service: Podiatry;  Laterality: Right;  . IRRIGATION AND DEBRIDEMENT FOOT Right 01/01/2018   Procedure: IRRIGATION AND DEBRIDEMENT FOOT-SKIN,SOFT TISSUE AND BONE;  Surgeon: Albertine Patricia, DPM;  Location: [facility];  Service: Podiatry;  Laterality: Right;  . IRRIGATION AND DEBRIDEMENT FOOT Left 04/06/2018   Procedure: IRRIGATION AND DEBRIDEMENT FOOT;  Surgeon: Sharlotte Alamo, DPM;  Location: [facility];  Service: Podiatry;  Laterality: Left;  . IRRIGATION AND DEBRIDEMENT FOOT Right 09/20/2018   Procedure: IRRIGATION AND DEBRIDEMENT FOOT;  Surgeon: Sharlotte Alamo, DPM;  Location: [facility];  Service: Podiatry;  Laterality: Right;  . LOWER EXTREMITY ANGIOGRAPHY Left 04/06/2018   Procedure:  Lower Extremity Angiography;  Surgeon: Algernon Huxley, MD;  Location: [facility];  Service: Cardiovascular;  Laterality: Left;    SOCIAL HISTORY:   Social History   Tobacco Use  . Smoking status: Never Smoker  . Smokeless tobacco: Never Used  Substance Use Topics  . Alcohol use: No    Frequency: Never    FAMILY HISTORY:   Family History  Problem Relation Age of Onset  . Diabetes Mother   . Ovarian cancer Mother   . Healthy Father     DRUG ALLERGIES:   Allergies  Allergen Reactions  . Banana Hives, Nausea And Vomiting and Rash  . Keflex [Cephalexin] Rash    No swelling- also taken penicillin without any issue.  . Sulfa Antibiotics Anaphylaxis  . Grapeseed Extract [Nutritional Supplements] Itching  . Shellfish Allergy Hives    "ALL SEAFOOD"  . Grape Seed Rash    REVIEW OF SYSTEMS:   Review of Systems  Constitutional: Positive for malaise/fatigue. Negative for chills and fever.  HENT: Negative for sore throat.   Eyes: Negative for blurred vision, double vision and pain.  Respiratory: Negative for cough, hemoptysis, shortness of breath and wheezing.   Cardiovascular: Negative for chest pain, palpitations, orthopnea and leg swelling.  Gastrointestinal: Positive for diarrhea and nausea. Negative for abdominal pain, constipation, heartburn and vomiting.  Genitourinary: Negative for dysuria and hematuria.  Musculoskeletal: Positive for joint pain. Negative for back pain.  Skin: Negative for rash.  Neurological: Negative for sensory change, speech change, focal weakness and headaches.  Endo/Heme/Allergies: Does not bruise/bleed easily.  Psychiatric/Behavioral: Negative for depression. The patient is not nervous/anxious.     MEDICATIONS AT HOME:   Prior to Admission medications   Medication Sig Start Date End Date Taking? Authorizing Provider  ALPRAZolam (XANAX) 0.25 MG tablet Take 1 tablet (0.25 mg total) by mouth 3 (three) times daily as needed for anxiety.  08/18/18  Yes Gladstone Lighter, MD  calcium carbonate (CALCIUM 600) 600 MG TABS tablet Take 1 tablet (600 mg total) by mouth 2 (two) times daily with a meal for 30 days. 09/13/18 10/13/18 Yes Milinda Pointer, MD  Cholecalciferol (VITAMIN D3) 125 MCG (5000 UT) CAPS Take 1 capsule (5,000 Units total) by mouth daily with breakfast. Take along with calcium and magnesium. 09/13/18 03/12/19 Yes Milinda Pointer, MD  Cyanocobalamin (VITAMIN B-12) 5000 MCG SUBL Place 1 tablet (5,000 mcg total) under the tongue daily. 09/13/18 12/12/18 Yes Milinda Pointer, MD  diphenoxylate-atropine (LOMOTIL) 2.5-0.025 MG tablet Take 1 tablet by mouth 4 (four) times daily as needed for diarrhea or loose stools.   Yes [provider]  ergocalciferol (VITAMIN D2) 1.25 MG (50000 UT) capsule Take 1 capsule (50,000 Units total) by mouth 2 (two) times a week. X 6 weeks. Patient taking differently: Take 50,000 Units by mouth 2 (two) times a week. X 6 weeks. Take on Monday and friday 09/14/18 10/26/18 Yes Milinda Pointer, MD  ferrous sulfate 325 (65 FE) MG tablet Take 1 tablet (325 mg total) by mouth 2 (two) times daily with a meal. 01/03/18  Yes Sainani, Belia Heman, MD  gabapentin (NEURONTIN) 300 MG capsule Take 1 capsule (300 mg total) by mouth 2 (two) times daily. 09/21/18  Yes Mody, Sital,  MD  insulin aspart (NOVOLOG) 100 UNIT/ML injection Inject 4 Units into the skin 3 (three) times daily with meals. 08/18/18  Yes Gladstone Lighter, MD  insulin glargine (LANTUS) 100 UNIT/ML injection Inject 0.3 mLs (30 Units total) into the skin daily. Patient taking differently: Inject 35 Units into the skin 2 (two) times daily.  08/18/18  Yes Gladstone Lighter, MD  oxyCODONE-acetaminophen (PERCOCET) 10-325 MG tablet Take 1 tablet by mouth every 6 (six) hours as needed for pain.   Yes [provider]  promethazine (PHENERGAN) 25 MG tablet Take 1 tablet (25 mg total) by mouth every 6 (six) hours as needed for nausea or vomiting.  08/18/18  Yes Gladstone Lighter, MD  vitamin C (VITAMIN C) 250 MG tablet Take 1 tablet (250 mg total) by mouth 2 (two) times daily. 06/20/18  Yes Gladstone Lighter, MD  zolpidem (AMBIEN) 10 MG tablet Take 10 mg by mouth at bedtime.    Yes [provider]  amoxicillin-clavulanate (AUGMENTIN) 875-125 MG tablet Take 1 tablet by mouth 2 (two) times daily. Patient not taking: Reported on 10/11/2018 09/21/18   Bettey Costa, MD  Insulin Syringes, Disposable, U-100 0.3 ML MISC 1 Syringe by Does not apply route 4 (four) times daily -  with meals and at bedtime. 12/27/17   Loletha Grayer, MD  levofloxacin (LEVAQUIN) 750 MG tablet Take 1 tablet (750 mg total) by mouth daily. Patient not taking: Reported on 10/11/2018 09/24/18   Carrie Mew, MD  lisinopril (PRINIVIL,ZESTRIL) 10 MG tablet Take 1 tablet (10 mg total) by mouth daily. Patient not taking: Reported on 10/11/2018 08/18/18   Gladstone Lighter, MD     VITAL SIGNS:  Blood pressure (!) 132/106, pulse (!) 106, temperature 98.2 F (36.8 C), temperature source Oral, resp. rate 12, height 5\' 6"  (1.676 m), weight 54.4 kg, SpO2 99 %.  PHYSICAL EXAMINATION:  Physical Exam  GENERAL:  33 y.o.-year-old patient lying in the bed with no acute distress.  EYES: Pupils equal, round, reactive to light and accommodation. No scleral icterus. Extraocular muscles intact.  HEENT: Head atraumatic, normocephalic. Oropharynx and nasopharynx clear. No oropharyngeal erythema, moist oral mucosa  NECK:  Supple, no jugular venous distention. No thyroid enlargement, no tenderness.  LUNGS: Normal breath sounds bilaterally, no wheezing, rales, rhonchi. No use of accessory muscles of respiration.  CARDIOVASCULAR: S1, S2 normal. No murmurs, rubs, or gallops.  ABDOMEN: Soft, nontender, nondistended. Bowel sounds present. No organomegaly or mass.  EXTREMITIES: No pedal edema, cyanosis, or clubbing.  Right foot dressing. NEUROLOGIC: Cranial nerves II through XII are intact.  No focal Motor or sensory deficits appreciated b/l PSYCHIATRIC: The patient is alert and oriented x 3. Good affect.  SKIN: No obvious rash, lesion, or ulcer.   LABORATORY PANEL:   CBC No results for input(s): WBC, HGB, HCT, PLT in the last 168 hours. ------------------------------------------------------------------------------------------------------------------  Chemistries  Recent Labs  Lab 10/11/18 0817  NA 131*  K 3.7  CL 100  CO2 20*  GLUCOSE 669*  BUN 57*  CREATININE 2.45*  CALCIUM 8.4*   ------------------------------------------------------------------------------------------------------------------  Cardiac Enzymes No results for input(s): TROPONINI in the last 168 hours. ------------------------------------------------------------------------------------------------------------------  RADIOLOGY:  No results found.   IMPRESSION AND PLAN:   *Uncontrolled diabetes mellitus with hyperglycemia.  No DKA at this time.  Patient was given stat dose of 15 units NovoLog.  Also ordered stat dose of Lantus 40 units.  This will be continued twice daily.  Blood sugars were reassessed and were at 400.  Bolus given of normal  saline.  Discussed with nursing staff. Patient will be admitted to medical floor for hyperglycemia.  Sliding scale insulin ordered in the resistant range.  Hemoglobin A1c checked recently was 13.7. He did receive IV Solu-Medrol 125 mg which is causing significant hyperglycemia at this time.  *Chronic right foot ulcers and evaluation for peripheral arterial disease.  Angiogram scheduled for tomorrow.  *CKD stage III is stable  *Chronic nausea and pain.  Continue home medications.  DVT prophylaxis.  Renally dose Lovenox  All the records are reviewed and case discussed with ED provider. Management plans discussed with the patient, family and they are in agreement.  CODE STATUS: Full code  TOTAL TIME TAKING CARE OF THIS PATIENT: 80 minutes.   Leia Alf  Trissa Molina M.D on 10/11/2018 at 12:18 PM  Between 7am to 6pm - Pager - [phone]  After 6pm go to [email or web address] - password [email or web address] [facility]  Office  [phone]  CC: Primary care physician; Valera Castle, MD  Note: This dictation was prepared with Dragon dictation along with smaller phrase technology. Any transcriptional errors that result from this process are unintentional.

## 2018-10-11 NOTE — Progress Notes (Signed)
PM CBG 406 by NT; rechecked by RN 378; 5 units Reg. Insulin given along with Lantus 40 Units; Laboratory glucose 445; repeat CBG 361; patient also asking for more food ([place] tray); instructed patient that he was not to get anymore food tonight; voiced understanding; Barbaraann Faster, RN 11:10 PM4/02/2019

## 2018-10-11 NOTE — Progress Notes (Addendum)
Inpatient Diabetes Program Recommendations  AACE/ADA: New Consensus Statement on Inpatient Glycemic Control  Target Ranges:  Prepandial:   less than 140 mg/dL      Peak postprandial:   less than 180 mg/dL (1-2 hours)      Critically ill patients:  140 - 180 mg/dL   Results for Shawn [name], Shawn [name] (MRN [id number]) as of 10/11/2018 13:32  Ref. Range 10/11/2018 08:00 10/11/2018 10:07 10/11/2018 10:54 10/11/2018 11:47  Glucose-Capillary Latest Ref Range: 70 - 99 mg/dL 577 (HH)  Novolog 10 units@9 :36  Solumedrol 125 mg >600 (HH) 524 (HH)  Novolog 10 units@10 :11 405 (H)  Novolog 15 units@11 :06  Lantus 40 units@11 :27  Results for Shawn [name], Shawn [name] (MRN [id number]) as of 10/11/2018 13:32  Ref. Range 10/11/2018 08:17  Glucose Latest Ref Range: 70 - 99 mg/dL 669 (HH)   Review of Glycemic Control  Diabetes history:DM1 (makes NO insulin; is sensitive to insulin; requires basal, correction, and meal coverage insulin) Outpatient Diabetes medications:Lantus 40units BID, Novolog 4 units TID with meals Current orders for Inpatient glycemic control:Lantus 40units BID, Novolog 0-20 units TID with meals, Novolog 0-5 units QHS  Inpatient Diabetes Program Recommendations:  Insulin-Basal: Patient has already received Lantus 40 units today. Patient has been inpatient multiple times over the past few months and does not usually require as much basal insulin as he reports taking at home. Please decrease Lantus to 25 units daily (to start on 10/12/18).  Insulin-Correction: Please decrease Novolog correction scale to Novolog 0-9 units TID with meals. Please don't give Novolog correction any closer than Q4H due to duration of Novolog (4-6 hours).   Insulin-Meal Coverage: Patient will need meal coverage but will not ask for it today since patient has received so much Novolog correction and Lantus 40 units today already.  NOTE: Patient has history of Type 1 DM and is well known to Inpatient Diabetes team. This is  patient's 5th admission in the past 4 months. Noted patient noted to have hyperglycemia this admission (instead of DKA) with initial glucose of 669 mg/dl. Patient has received a total of Novolog 35 units and Lantus 40 units over the past 4 hours. Concerned patient will experience hypoglycemia from so much Novolog and likely too much basal insulin.   Thanks, [name], RN, MSN, CDE Diabetes Coordinator Inpatient Diabetes Program [phone] (Team Pager from 8am to 5pm)

## 2018-10-12 ENCOUNTER — Other Ambulatory Visit (INDEPENDENT_AMBULATORY_CARE_PROVIDER_SITE_OTHER): Payer: Self-pay | Admitting: Vascular Surgery

## 2018-10-12 ENCOUNTER — Inpatient Hospital Stay: Payer: Medicaid Other

## 2018-10-12 LAB — GLUCOSE, CAPILLARY
Glucose-Capillary: 110 mg/dL — ABNORMAL HIGH (ref 70–99)
Glucose-Capillary: 112 mg/dL — ABNORMAL HIGH (ref 70–99)
Glucose-Capillary: 237 mg/dL — ABNORMAL HIGH (ref 70–99)
Glucose-Capillary: 207 mg/dL — ABNORMAL HIGH (ref 70–99)

## 2018-10-12 LAB — BASIC METABOLIC PANEL
Anion gap: 10 (ref 5–15)
BUN: 55 mg/dL — ABNORMAL HIGH (ref 6–20)
CO2: 16 mmol/L — ABNORMAL LOW (ref 22–32)
Calcium: 8.6 mg/dL — ABNORMAL LOW (ref 8.9–10.3)
Chloride: 107 mmol/L (ref 98–111)
Creatinine, Ser: 2.16 mg/dL — ABNORMAL HIGH (ref 0.61–1.24)
GFR calc Af Amer: 46 mL/min — ABNORMAL LOW (ref >60)
GFR calc non Af Amer: 39 mL/min — ABNORMAL LOW (ref >60)
Glucose, Bld: 236 mg/dL — ABNORMAL HIGH (ref 70–99)
Potassium: 3.6 mmol/L (ref 3.5–5.1)
Sodium: 133 mmol/L — ABNORMAL LOW (ref 135–145)

## 2018-10-12 MED ORDER — SODIUM CHLORIDE 0.9 % IV SOLN
INTRAVENOUS | Status: DC
  Administered 2018-10-12: via INTRAVENOUS

## 2018-10-12 MED ORDER — ENOXAPARIN SODIUM 30 MG/0.3ML ~~LOC~~ SOLN
30.0000 mg | SUBCUTANEOUS | Status: DC
  Administered 2018-10-12: 21:00:00 30 mg via SUBCUTANEOUS
  Filled 2018-10-12: qty 0.3

## 2018-10-12 MED ORDER — GLUCERNA SHAKE PO LIQD
237.0000 mL | Freq: Three times a day (TID) | ORAL | Status: DC
  Administered 2018-10-12 – 2018-10-13 (×3): 237 mL via ORAL

## 2018-10-12 MED ORDER — INSULIN ASPART 100 UNIT/ML ~~LOC~~ SOLN
0.0000 [IU] | Freq: Three times a day (TID) | SUBCUTANEOUS | Status: DC
  Administered 2018-10-12: 17:00:00 3 [IU] via SUBCUTANEOUS
  Filled 2018-10-12: qty 1

## 2018-10-12 MED ORDER — INSULIN ASPART 100 UNIT/ML ~~LOC~~ SOLN
0.0000 [IU] | Freq: Every day | SUBCUTANEOUS | Status: DC
  Administered 2018-10-12: 21:00:00 2 [IU] via SUBCUTANEOUS
  Filled 2018-10-12: qty 1

## 2018-10-12 MED ORDER — INSULIN GLARGINE 100 UNIT/ML ~~LOC~~ SOLN
30.0000 [IU] | Freq: Two times a day (BID) | SUBCUTANEOUS | Status: DC
  Administered 2018-10-12 – 2018-10-13 (×3): 30 [IU] via SUBCUTANEOUS
  Filled 2018-10-12 (×4): qty 0.3

## 2018-10-12 MED ORDER — CLINDAMYCIN PHOSPHATE 300 MG/50ML IV SOLN
300.0000 mg | Freq: Once | INTRAVENOUS | Status: AC
  Administered 2018-10-13: 09:00:00 300 mg via INTRAVENOUS
  Filled 2018-10-12: qty 50

## 2018-10-12 MED ORDER — ADULT MULTIVITAMIN W/MINERALS CH
1.0000 | ORAL_TABLET | Freq: Every day | ORAL | Status: DC
  Administered 2018-10-12 – 2018-10-13 (×2): 1 via ORAL
  Filled 2018-10-12: qty 1

## 2018-10-12 MED ORDER — VITAMIN C 500 MG PO TABS
500.0000 mg | ORAL_TABLET | Freq: Two times a day (BID) | ORAL | Status: DC
  Administered 2018-10-12 – 2018-10-13 (×2): 500 mg via ORAL
  Filled 2018-10-12 (×2): qty 1

## 2018-10-12 NOTE — Progress Notes (Signed)
   10/12/18 1300  Clinical Encounter Type  Visit Type Initial;Spiritual support  Referral From Chaplain  Consult/Referral To [facility] was rounding by phone calls and spoke with patient. Patient appreciated the Chaplain for calling and said he was ok and didn't need anything at this time. Chaplain offered to keep patient in prayer and gave blessing for continued health.

## 2018-10-12 NOTE — Consult Note (Signed)
[facility] Nurse wound consult note  Consultation was completed by review of records, images.  Reason for Consult: foot wound and healing sacral wound Wound type: arterial ulceration in the presence of uncontrolled DM Pressure Injury POA: NA Measurement:see nursing flow sheet  Dressing procedure/placement/frequency: Reviewed records, patient with uncontrolled DM and history of PAD. Dr. Arneta Cliche planning vascular intervention 10/13/18. Patient admitted with elevated BS. Will order only conservative wound care at this time until after vascular intervention.  I have requested vascular surgeon to order wound care after intervention if limb salvage is the plan.    Re consult if needed, will not follow at this time. Thanks  Teruko Joswick [employer], RN,CWOCN, CNS, [place] [phone])

## 2018-10-12 NOTE — Progress Notes (Signed)
[facility] [place] NAME: Shawn [name]    MR#:  [id number]  DATE OF BIRTH:  [birth year]  SUBJECTIVE:  CHIEF COMPLAINT:  No chief complaint on file.  Continues to have pain in his foot.  Good appetite.  Is asking for regular food.  REVIEW OF SYSTEMS:    Review of Systems  Constitutional: Positive for malaise/fatigue. Negative for chills and fever.  HENT: Negative for sore throat.   Eyes: Negative for blurred vision, double vision and pain.  Respiratory: Negative for cough, hemoptysis, shortness of breath and wheezing.   Cardiovascular: Negative for chest pain, palpitations, orthopnea and leg swelling.  Gastrointestinal: Negative for abdominal pain, constipation, diarrhea, heartburn, nausea and vomiting.  Genitourinary: Negative for dysuria and hematuria.  Musculoskeletal: Positive for joint pain. Negative for back pain.  Skin: Negative for rash.  Neurological: Negative for sensory change, speech change, focal weakness and headaches.  Endo/Heme/Allergies: Does not bruise/bleed easily.  Psychiatric/Behavioral: Negative for depression. The patient is not nervous/anxious.     DRUG ALLERGIES:   Allergies  Allergen Reactions  . Banana Hives, Nausea And Vomiting and Rash  . Keflex [Cephalexin] Rash    No swelling- also taken penicillin without any issue.  . Sulfa Antibiotics Anaphylaxis  . Grapeseed Extract [Nutritional Supplements] Itching  . Shellfish Allergy Hives    "ALL SEAFOOD"  . Grape Seed Rash    VITALS:  Blood pressure (!) 130/91, pulse 80, temperature 97.6 F (36.4 C), temperature source Oral, resp. rate 16, height 5\' 6"  (1.676 m), weight 51 kg, SpO2 100 %.  PHYSICAL EXAMINATION:   Physical Exam  GENERAL:  33 y.o.-year-old patient lying in the bed with no acute distress.  EYES: Pupils equal, round, reactive to light and accommodation. No scleral icterus. Extraocular muscles intact.  HEENT: Head atraumatic,  normocephalic. Oropharynx and nasopharynx clear.  NECK:  Supple, no jugular venous distention. No thyroid enlargement, no tenderness.  LUNGS: Normal breath sounds bilaterally, no wheezing, rales, rhonchi. No use of accessory muscles of respiration.  CARDIOVASCULAR: S1, S2 normal. No murmurs, rubs, or gallops.  ABDOMEN: Soft, nontender, nondistended. Bowel sounds present. No organomegaly or mass.  EXTREMITIES: No cyanosis, clubbing or edema b/l.   Right foot dressing NEUROLOGIC: Cranial nerves II through XII are intact. No focal Motor or sensory deficits b/l.   PSYCHIATRIC: The patient is alert and oriented x 3.  SKIN: No obvious rash, lesion, or ulcer.   LABORATORY PANEL:   CBC No results for input(s): WBC, HGB, HCT, PLT in the last 168 hours. ------------------------------------------------------------------------------------------------------------------ Chemistries  Recent Labs  Lab 10/12/18 0354  NA 133*  K 3.6  CL 107  CO2 16*  GLUCOSE 236*  BUN 55*  CREATININE 2.16*  CALCIUM 8.6*   ------------------------------------------------------------------------------------------------------------------  Cardiac Enzymes No results for input(s): TROPONINI in the last 168 hours. ------------------------------------------------------------------------------------------------------------------  RADIOLOGY:  No results found.   ASSESSMENT AND PLAN:   *Uncontrolled diabetes mellitus with hyperglycemia.   Patient has noticed blood sugars running in 300s at home.  He also received Solu-Medrol yesterday.  Blood sugars are improved.  Will decrease Lantus to 30 units twice daily today.  I also reduce the sliding scale insulin from resistant to mild scale. Diabetic diet.  *Chronic right foot ulcers and evaluation for peripheral arterial disease.  Angiogram scheduled for tomorrow. Consulted podiatry as requested by vascular surgery team  *CKD stage III is stable  *Chronic nausea  and pain.  Continue home medications.  DVT prophylaxis.  Renally  dosed Lovenox  All the records are reviewed and case discussed with Care Management/Social Worker Management plans discussed with the patient, family and they are in agreement.  CODE STATUS: Full code  DVT Prophylaxis: SCDs  TOTAL TIME TAKING CARE OF THIS PATIENT: 35 minutes.   POSSIBLE D/C IN 1-2 DAYS, DEPENDING ON CLINICAL CONDITION.  Leia Alf Jasira Robinson M.D on 10/12/2018 at 10:57 AM  Between 7am to 6pm - Pager - [phone]  After 6pm go to [email or web address] - password [email or web address] [facility]  Office  [phone]  CC: Primary care physician; Valera Castle, MD  Note: This dictation was prepared with Dragon dictation along with smaller phrase technology. Any transcriptional errors that result from this process are unintentional.

## 2018-10-12 NOTE — Progress Notes (Signed)
   Subjective/Chief Complaint: Patient seen in consultation for ulceration on his right heel.  Admitted yesterday with severely elevated blood sugars prior to his angiogram.  At this point scheduled for tomorrow.  Still relates pain in his right foot.   Objective: Vital signs in last 24 hours: Temp:  [97.6 F (36.4 C)-98.6 F (37 C)] 97.6 F (36.4 C) (04/09 0454) Pulse Rate:  [80-99] 80 (04/09 0454) Resp:  [16-18] 16 (04/09 0454) BP: (123-143)/(85-103) 130/91 (04/09 0454) SpO2:  [100 %] 100 % (04/09 0454) Weight:  [51 kg] 51 kg (04/09 0500) Last BM Date: 10/11/18  Intake/Output from previous day: 04/08 0701 - 04/09 0700 In: 2536.7 [P.O.:1370; I.V.:1166.7] Out: 750 [Urine:750] Intake/Output this shift: Total I/O In: 240 [P.O.:240] Out: 275 [Urine:275]  Pedal pulses are palpable on the right foot.  Skin is warm dry and supple with a large full-thickness ulceration on the plantar aspect of the right heel approximately 5 cm diameter with a fibrotic and necrotic base.  Minimal surrounding cellulitis.  Probes deep at least 1 cm down to the level of the inferior calcaneus.  Lab Results:  No results for input(s): WBC, HGB, HCT, PLT in the last 72 hours. BMET Recent Labs    10/11/18 0817 10/11/18 2222 10/12/18 0354  NA 131*  --  133*  K 3.7  --  3.6  CL 100  --  107  CO2 20*  --  16*  GLUCOSE 669* 445* 236*  BUN 57*  --  55*  CREATININE 2.45*  --  2.16*  CALCIUM 8.4*  --  8.6*   PT/INR No results for input(s): LABPROT, INR in the last 72 hours. ABG No results for input(s): PHART, HCO3 in the last 72 hours.  Invalid input(s): PCO2, PO2  Studies/Results: No results found.  Anti-infectives: Anti-infectives (From admission, onward)   Start     Dose/Rate Route Frequency Ordered Stop   10/13/18 0500  clindamycin (CLEOCIN) IVPB 300 mg    Note to Pharmacy:  Please send with patient to specials   300 mg 100 mL/hr over 30 Minutes Intravenous  Once 10/12/18 1036     10/11/18 0815  clindamycin (CLEOCIN) IVPB 300 mg  Status:  Discontinued     300 mg 100 mL/hr over 30 Minutes Intravenous On call to O.R. 10/11/18 0803 10/12/18 0559   10/11/18 0809  clindamycin (CLEOCIN) 300 MG/50ML IVPB  Status:  Discontinued    Note to Pharmacy:  Genelle Bal   : cabinet override      10/11/18 0809 10/11/18 0818      Assessment/Plan: s/p Procedure(s): LOWER EXTREMITY ANGIOGRAPHY (Right) Assessment: 1.  Full-thickness ulceration right foot.2.  Diabetes with peripheral vascular disease   Plan: Sterile saline wet-to-dry dressing applied to the right foot.  We will obtain an MRI for evaluation of any bone penetration as far as infection.  Await for angiogram tomorrow.  Pending the results he will at least need another debridement on the right heel although he is still at significant risk for amputation of the right lower extremity.  Follow-up tomorrow after his MRI and vascular procedure.  LOS: 1 day    [name] 10/12/2018

## 2018-10-12 NOTE — Telephone Encounter (Signed)
I spoke with the patient's PCP Dr. Kym Groom and per him he could only attest to the fact that the patient stated he had vomiting once and diarrhea once with nausea at 5:30 am on the morning of 10/09/2018. The patient did not go into the office but did a virtual office visit. I spoke with Dr. Delana Meyer and the patient was put back on the schedule for his procedure with Dr. Delana Meyer for a leg angio.

## 2018-10-12 NOTE — Progress Notes (Signed)
Initial Nutrition Assessment  DOCUMENTATION CODES:   Severe malnutrition in context of chronic illness  INTERVENTION:   Glucerna Shake po TID, each supplement provides 220 kcal and 10 grams of protein  MVI daily   Vitamin C 500mg  po BID  NUTRITION DIAGNOSIS:   Severe Malnutrition related to chronic illness(uncontrolled DM) as evidenced by moderate to severe fat depletions, moderate to severe muscle depletions.  GOAL:   Patient will meet greater than or equal to 90% of their needs  MONITOR:   PO intake, Supplement acceptance, Labs, Weight trends, Skin, I & O's  REASON FOR ASSESSMENT:   Malnutrition Screening Tool    ASSESSMENT:   33 y.o. male with type I DM since age 89yrs, IBS, gastroparesis, CKD III admitted with hyperglycemia and R foot ulcers   RD working remotely.  Pt is well known to nutrition department and this RD from multiple previous admit. Pt is generally a good eater while hospitalized and pt enjoys Glucerna as he drinks these at home. RD will add supplements and vitamins to support wound healing. Per chart, pt appears fairly weight stable pta.   Medications reviewed and include: lovenox, insulin, senokot, NaCl @75ml /hr  Labs reviewed: Na 133(L), BUN 55(H), creat 2.16(H) Wbc- 13.8(H), Hgb 8.4(L). Hct 27.1(L) cbgs- 196, 406, 378, 361, 110 x 24 hrs AIC 13.7(H)- 3/18  Diet Order:   Diet Order            Diet NPO time specified Except for: Sips with Meds  Diet effective midnight        Diet Carb Modified Fluid consistency: Thin; Room service appropriate? Yes  Diet effective now             EDUCATION NEEDS:   Not appropriate for education at this time  Skin:  Skin Assessment: Reviewed RN Assessment(Stage II sacrum, R foot ulcers with VAC)  Last BM:  4/8- type 6  Height:   Ht Readings from Last 1 Encounters:  10/11/18 5\' 6"  (1.676 m)    Weight:   Wt Readings from Last 1 Encounters:  10/12/18 51 kg    Ideal Body Weight:  64.5  kg  BMI:  Body mass index is 18.15 kg/m.  Estimated Nutritional Needs:   Kcal:  1900-2200kcal/day   Protein:  95-110g/day   Fluid:  >1.5L/day   [name] MS, RD, LDN Pager #- [phone] Office#- [phone] After Hours Pager: [phone]

## 2018-10-12 NOTE — Progress Notes (Signed)
Anticoagulation monitoring(Lovenox):  33yo  M ordered Lovenox 40 mg Q24h  Filed Weights   10/11/18 0751 10/12/18 0500  Weight: 120 lb (54.4 kg) 112 lb 7 oz (51 kg)   BMI 18.16   Lab Results  Component Value Date   CREATININE 2.16 (H) 10/12/2018   CREATININE 2.45 (H) 10/11/2018   CREATININE 2.63 (H) 09/24/2018   Estimated Creatinine Clearance: 35.7 mL/min (A) (by C-G formula based on SCr of 2.16 mg/dL (H)). Hemoglobin & Hematocrit     Component Value Date/Time   HGB 8.4 (L) 09/24/2018 1815   HGB 16.4 10/06/2014 0539   HCT 27.1 (L) 09/24/2018 1815   HCT 47.4 10/06/2014 0539     Per Protocol for Patient with estCrcl > 30 ml/min and Weight 51 kg (< 57 kg for Male) will transition to Lovenox 30 mg Q24h     Shawn [name] PharmD Clinical Pharmacist 10/12/2018

## 2018-10-13 ENCOUNTER — Encounter
Admission: RE | Disposition: A | Discharge status: Home / Self Care | Payer: Self-pay | Source: Home / Self Care | Attending: Internal Medicine

## 2018-10-13 ENCOUNTER — Encounter: Payer: Self-pay | Admitting: Vascular Surgery

## 2018-10-13 DIAGNOSIS — I70234 Atherosclerosis of native arteries of right leg with ulceration of heel and midfoot: Secondary | ICD-10-CM

## 2018-10-13 DIAGNOSIS — L97416 Non-pressure chronic ulcer of right heel and midfoot with bone involvement without evidence of necrosis: Secondary | ICD-10-CM

## 2018-10-13 DIAGNOSIS — M869 Osteomyelitis, unspecified: Secondary | ICD-10-CM

## 2018-10-13 HISTORY — PX: LOWER EXTREMITY ANGIOGRAPHY: CATH118251

## 2018-10-13 LAB — BASIC METABOLIC PANEL
Anion gap: 6 (ref 5–15)
BUN: 61 mg/dL — ABNORMAL HIGH (ref 6–20)
CO2: 16 mmol/L — ABNORMAL LOW (ref 22–32)
Calcium: 7.9 mg/dL — ABNORMAL LOW (ref 8.9–10.3)
Chloride: 112 mmol/L — ABNORMAL HIGH (ref 98–111)
Creatinine, Ser: 2.3 mg/dL — ABNORMAL HIGH (ref 0.61–1.24)
GFR calc Af Amer: 42 mL/min — ABNORMAL LOW (ref >60)
GFR calc non Af Amer: 36 mL/min — ABNORMAL LOW (ref >60)
Glucose, Bld: 168 mg/dL — ABNORMAL HIGH (ref 70–99)
Potassium: 3.8 mmol/L (ref 3.5–5.1)
Sodium: 134 mmol/L — ABNORMAL LOW (ref 135–145)

## 2018-10-13 LAB — GLUCOSE, CAPILLARY
Glucose-Capillary: 78 mg/dL (ref 70–99)
Glucose-Capillary: 67 mg/dL — ABNORMAL LOW (ref 70–99)

## 2018-10-13 SURGERY — LOWER EXTREMITY ANGIOGRAPHY
Anesthesia: Moderate Sedation | Laterality: Bilateral

## 2018-10-13 MED ORDER — HYDROMORPHONE HCL 1 MG/ML IJ SOLN: 1.0000 mg | Freq: Once | INTRAMUSCULAR | Status: DC | PRN

## 2018-10-13 MED ORDER — METHYLPREDNISOLONE SODIUM SUCC 125 MG IJ SOLR
125.0000 mg | Freq: Once | INTRAMUSCULAR | Status: AC | PRN
  Administered 2018-10-13: 08:00:00 125 mg via INTRAVENOUS

## 2018-10-13 MED ORDER — DIPHENHYDRAMINE HCL 50 MG/ML IJ SOLN
50.0000 mg | Freq: Once | INTRAMUSCULAR | Status: AC | PRN
  Administered 2018-10-13: 08:00:00 50 mg via INTRAVENOUS

## 2018-10-13 MED ORDER — MORPHINE SULFATE (PF) 2 MG/ML IV SOLN
INTRAVENOUS | Status: AC
  Administered 2018-10-13: 09:00:00 2 mg via INTRAVENOUS
  Filled 2018-10-13: qty 1

## 2018-10-13 MED ORDER — HEPARIN SODIUM (PORCINE) 1000 UNIT/ML IJ SOLN
INTRAMUSCULAR | Status: AC
  Filled 2018-10-13: qty 1

## 2018-10-13 MED ORDER — SODIUM CHLORIDE 0.9 % IV SOLN: INTRAVENOUS | Status: DC

## 2018-10-13 MED ORDER — MIDAZOLAM HCL 2 MG/2ML IJ SOLN
INTRAMUSCULAR | Status: DC | PRN
  Administered 2018-10-13: 2 mg via INTRAVENOUS

## 2018-10-13 MED ORDER — METHYLPREDNISOLONE SODIUM SUCC 125 MG IJ SOLR
INTRAMUSCULAR | Status: AC
  Administered 2018-10-13: 08:00:00 125 mg via INTRAVENOUS
  Filled 2018-10-13: qty 2

## 2018-10-13 MED ORDER — OXYCODONE-ACETAMINOPHEN 10-325 MG PO TABS: 1.0000 | ORAL_TABLET | Freq: Four times a day (QID) | ORAL | 0 refills | Status: AC | PRN

## 2018-10-13 MED ORDER — FENTANYL CITRATE (PF) 100 MCG/2ML IJ SOLN
INTRAMUSCULAR | Status: DC | PRN
  Administered 2018-10-13: 50 ug via INTRAVENOUS

## 2018-10-13 MED ORDER — ALUM & MAG HYDROXIDE-SIMETH 200-200-20 MG/5ML PO SUSP
30.0000 mL | Freq: Four times a day (QID) | ORAL | Status: DC | PRN
  Administered 2018-10-13: 05:00:00 30 mL via ORAL
  Filled 2018-10-13: qty 30

## 2018-10-13 MED ORDER — ONDANSETRON HCL 4 MG/2ML IJ SOLN: 4.0000 mg | Freq: Four times a day (QID) | INTRAMUSCULAR | Status: DC | PRN

## 2018-10-13 MED ORDER — FAMOTIDINE 20 MG PO TABS
ORAL_TABLET | ORAL | Status: AC
  Administered 2018-10-13: 08:00:00 40 mg via ORAL
  Filled 2018-10-13: qty 2

## 2018-10-13 MED ORDER — DIPHENHYDRAMINE HCL 50 MG/ML IJ SOLN
INTRAMUSCULAR | Status: AC
  Administered 2018-10-13: 50 mg via INTRAVENOUS
  Filled 2018-10-13: qty 1

## 2018-10-13 MED ORDER — FAMOTIDINE 20 MG PO TABS
40.0000 mg | ORAL_TABLET | Freq: Once | ORAL | Status: AC | PRN
  Administered 2018-10-13: 08:00:00 40 mg via ORAL

## 2018-10-13 MED ORDER — IOHEXOL 300 MG/ML  SOLN
INTRAMUSCULAR | Status: DC | PRN
  Administered 2018-10-13: 09:00:00 75 mL via INTRA_ARTERIAL

## 2018-10-13 MED ORDER — CLINDAMYCIN PHOSPHATE 300 MG/50ML IV SOLN
INTRAVENOUS | Status: AC
  Administered 2018-10-13: 09:00:00 300 mg via INTRAVENOUS
  Filled 2018-10-13: qty 50

## 2018-10-13 MED ORDER — MIDAZOLAM HCL 2 MG/ML PO SYRP: 8.0000 mg | ORAL_SOLUTION | Freq: Once | ORAL | Status: DC | PRN

## 2018-10-13 MED ORDER — LIDOCAINE HCL (PF) 1 % IJ SOLN
INTRAMUSCULAR | Status: AC
  Filled 2018-10-13: qty 30

## 2018-10-13 MED ORDER — FENTANYL CITRATE (PF) 100 MCG/2ML IJ SOLN
INTRAMUSCULAR | Status: AC
  Filled 2018-10-13: qty 2

## 2018-10-13 MED ORDER — MIDAZOLAM HCL 5 MG/5ML IJ SOLN
INTRAMUSCULAR | Status: AC
  Filled 2018-10-13: qty 5

## 2018-10-13 SURGICAL SUPPLY — 12 items
CATH PIG 70CM (CATHETERS) ×2 IMPLANT
DEVICE STARCLOSE SE CLOSURE (Vascular Products) ×2 IMPLANT
DEVICE TORQUE .025-.038 (MISCELLANEOUS) ×2 IMPLANT
NDL ENTRY 21GA 7CM ECHOTIP (NEEDLE) IMPLANT
NEEDLE ENTRY 21GA 7CM ECHOTIP (NEEDLE) ×3 IMPLANT
PACK ANGIOGRAPHY (CUSTOM PROCEDURE TRAY) ×3 IMPLANT
SET INTRO CAPELLA COAXIAL (SET/KITS/TRAYS/PACK) ×2 IMPLANT
SHEATH BRITE TIP 5FRX11 (SHEATH) ×2 IMPLANT
SYR MEDRAD MARK 7 150ML (SYRINGE) ×2 IMPLANT
TUBING CONTRAST HIGH PRESS 72 (TUBING) ×2 IMPLANT
WIRE AQUATRACK .035X260CM (WIRE) ×2 IMPLANT
WIRE J 3MM .035X145CM (WIRE) ×2 IMPLANT

## 2018-10-13 NOTE — Progress Notes (Signed)
Patient ID: Shawn [name], male   DOB: 05-01-1986, 33 y.o.   MRN: [id number] Patient seen this morning.  Discussed with the patient that his circulation in his right leg appeared good following his angiogram.  Discussed again his 2 options of below-knee amputation versus some type of debridement on the right foot.  Spoke with care management and at this point does not have any benefits left for home health care and if local debridement was performed he would need IV antibiotics for 6 weeks as well as significant wound care.  I think that long-term his better option would be to consider below-knee amputation which at this point I think he has resigned himself to.  Spoke with Dr. Delana Meyer after his angiogram and he would be able to perform this most likely next Wednesday.  Patient has a desire to go home to spend Easter with his family and it looks like this will be a viable option and then have him return on Tuesday for admission prior to his amputation on Wednesday.  Patient may resume his antibiotics at home until his readmission.  At this point we will plan for discharge tomorrow and sign off and defer to vascular surgery for definitive treatment.

## 2018-10-13 NOTE — H&P (Signed)
[facility] History & Physical Update  The patient was interviewed and re-examined.  The patient's previous History and Physical has been reviewed and is unchanged.  There is no change in the plan of care. We plan to proceed with the scheduled procedure.  Hortencia Pilar, MD  10/13/2018, 8:07 AM

## 2018-10-13 NOTE — Op Note (Signed)
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  Abdominal aortogram  2.  Right lower extremity distal runoff third order catheter placement  3.  Star close left common femoral   Anesthesia: Conscious sedation was administered by the interventional radiology RN under my direct supervision. IV Versed plus fentanyl were utilized. Continuous ECG, pulse oximetry and blood pressure was monitored throughout the entire procedure.  Conscious sedation was administered for a total of 35 minutes.  Sheath: 5 French 11 cm Pinnacle sheath retrograde left common femoral  Contrast: 75 cc   Fluoroscopy Time: 5.5 minutes  Indications:  The patient presents to [facility] with gangrenous changes to the right heel.  Pedal pulses are weakly palpable bilaterally suggesting atherosclerotic occlusive disease.  The risks and benefits as well as alternative therapies for lower extremity revascularization are reviewed with the patient all questions are answered the patient agrees to proceed.  The patient is therefore undergoing angiography with the hope for intervention for limb salvage.   Procedure:  Shawn [name] a 33 y.o. male who was identified and appropriate procedural time out was performed.  The patient was then placed supine on the table and prepped and draped in the usual sterile fashion.  Ultrasound was used to evaluate the left common femoral artery.  It was echolucent and pulsatile indicating it is patent .  An ultrasound image was acquired for the permanent record.  A micropuncture needle was used to access the left common femoral artery under direct ultrasound guidance.   The microwire was then advanced under fluoroscopic guidance without difficulty followed by the micro-sheath.  A 0.035 J wire was advanced without resistance and a 5Fr sheath was placed.    Pigtail catheter was then advanced to the level of T12 and AP projection of the aorta was obtained. Pigtail catheter was then repositioned to above the bifurcation and LAO view of the pelvis was obtained. Stiff angled Glidewire and pigtail catheter was then used across the bifurcation and the catheter was positioned in the distal external iliac artery.  RAO view of the right groin was then obtained. Wire was reintroduced and negotiated into the SFA and the catheter was advanced into the SFA. Distal runoff was then performed.  After review of the images the catheter was removed over wire and an LAO view of the groin was obtained. StarClose device was deployed without difficulty.   Findings:   Aortogram: The abdominal aorta is opacified with a bolus injection contrast.  It is widely patent with minimal evidence of atherosclerotic changes.  Bilateral common and external iliac arteries are widely patent as is the internal iliac arteries bilaterally.  Right Lower Extremity: The common femoral profunda femoris superficial femoral and popliteal are all widely patent with minimal atherosclerotic changes.  The trifurcation is patent and there is three-vessel runoff to the level of the ankle.  Both the dorsalis pedis and posterior tibial cross the ankle.  Pedal arch demonstrates moderate to severe disease and there is significant distal small vessel disease.  Essentially this is unchanged from his previous angiogram.  This study suggests optimal flow for wound healing of a heel ulceration.  Given the severity of his tissue loss as well as the infectious changes of the calcaneus and  the poor long-term prognosis this situation implies consideration for a below-knee amputation must be made.  Left Lower Extremity: The common femoral  and visualized portions of the profunda femoris and superficial femoral are widely patent.   Disposition: Patient was taken to the recovery room in stable condition having tolerated the procedure well.  Shawn [name] 10/13/2018,9:13 AM

## 2018-10-13 NOTE — Discharge Planning (Signed)
D/c paperwork and instructions have been given to p.t

## 2018-10-13 NOTE — TOC Initial Note (Signed)
Transition of Care [facility]) - Initial/Assessment Note    Patient Details  Name: Shawn [name] MRN: [id number] Date of Birth: [birth year]  Transition of Care [facility]) CM/SW Contact:    Shela Leff, LCSW Phone Number: 10/13/2018, 11:58 AM  Clinical Narrative:       Patient discharging today to go home and resume his antibiotics that he already has at home. The plan will be for him to return to the hospital Tuesday to get an amputation. CSW will see and assess patient at that time. Physician states patient is in agreement with this plan.            Expected Discharge Plan: Home/Self Care Barriers to Discharge: No Barriers Identified   Patient Goals and CMS Choice        Expected Discharge Plan and Services Expected Discharge Plan: Home/Self Care         Expected Discharge Date: 10/13/18                        Prior Living Arrangements/Services     Patient language and need for interpreter reviewed:: Yes Do you feel safe going back to the place where you live?: Yes      Need for Family Participation in Patient Care: No (Comment) Care giver support system in place?: Yes (comment)   Criminal Activity/Legal Involvement Pertinent to Current Situation/Hospitalization: No - Comment as needed  Activities of Daily Living Home Assistive Devices/Equipment: Environmental consultant (specify type), Wheelchair ADL Screening (condition at time of admission) Patient's cognitive ability adequate to safely complete daily activities?: Yes Is the patient deaf or have difficulty hearing?: No Does the patient have difficulty seeing, even when wearing glasses/contacts?: No Does the patient have difficulty concentrating, remembering, or making decisions?: No Patient able to express need for assistance with ADLs?: Yes Does the patient have difficulty dressing or bathing?: No Independently performs ADLs?: Yes (appropriate for developmental age) Does the patient have difficulty walking or climbing stairs?:  Yes Weakness of Legs: Both Weakness of Arms/Hands: None  Permission Sought/Granted                  Emotional Assessment Appearance:: Appears stated age     Orientation: : Oriented to Self, Oriented to Place, Oriented to  Time, Oriented to Situation Alcohol / Substance Use: Not Applicable Psych Involvement: No (comment)  Admission diagnosis:  RT lower extremity angio   ASO w ulceration SHELLFISH ALLERGY Patient Active Problem List   Diagnosis Date Noted  . Hyperglycemia 10/11/2018  . Pressure injury of skin 09/20/2018  . Uncontrolled diabetes mellitus ([place]) 09/19/2018  . Neurogenic pain 09/13/2018  . Marijuana use 09/13/2018  . Long term prescription benzodiazepine use 09/13/2018  . Insulin-dependent diabetes mellitus with renal complications ([place]) 93/71/6967  . Vitamin D deficiency 08/21/2018  . Hypocalcemia 08/21/2018  . CKD stage G3a/A1, GFR 45-59 and albumin creatinine ratio <30 mg/g (HCC) 08/21/2018  . History of acute renal failure 08/21/2018  . Ronald (hyperglycemic hyperosmolar nonketotic coma) ([place]) 08/16/2018  . Chronic foot pain (Primary Area of Pain) (Bilateral) (L>R) 08/14/2018  . Chronic lower extremity pain (Secondary Area of Pain) (Bilateral) (L>R) 08/14/2018  . Chronic generalized abdominal pain [facility] Area of Pain) 08/14/2018  . Chronic pain syndrome 08/14/2018  . Long term current use of opiate analgesic 08/14/2018  . Pharmacologic therapy 08/14/2018  . Disorder of skeletal system 08/14/2018  . Problems influencing health status 08/14/2018  . Nausea & vomiting 07/16/2018  . IV infusion line  dysfunction ([place]) 07/09/2018  . Hyperglycemic hyperosmolar nonketotic coma ([place]) 05/28/2018  . Acute renal failure (ARF) ([place]) 04/27/2018  . Left foot infection 04/05/2018  . Foot infection 02/06/2018  . Local infection of the skin and subcutaneous tissue, unspecified 02/06/2018  . Hypertension associated with diabetes ([place]) 01/16/2018  . Osteomyelitis ([place])  01/10/2018  . Cellulitis 12/29/2017  . Sepsis ([place]) 12/18/2017  . Moderate recurrent major depression ([place]) 11/10/2017  . Type 2 diabetes mellitus with hyperosmolar nonketotic hyperglycemia ([place]) 11/09/2017  . History of migraine 07/15/2017  . IBS (irritable bowel syndrome) 07/15/2017  . Protein-calorie malnutrition, severe 07/14/2017  . Abdominal pain 07/13/2017  . Diarrhea 06/01/2013  . 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 GERD (gastroesophageal reflux disease) 12/30/2009  . Type 1 diabetes mellitus with diabetic polyneuropathy ([place]) 11/25/2009  . MDD (major depressive disorder) 11/25/2009  . Hypercholesterolemia 03/07/2008   PCP:  Valera Castle, MD Pharmacy:   [place], [place] - [address] [place] [address] Phone: [phone] Fax: [phone]     Social Determinants of Health (SDOH) Interventions    Readmission Risk Interventions Readmission Risk Prevention Plan 10/13/2018 09/21/2018 09/21/2018  Transportation Screening Complete Complete Complete  Medication Review Press photographer) Complete Complete Complete  PCP or Specialist appointment within 3-5 days of discharge Complete Complete Complete  HRI or Home Care Consult Complete Complete -  SW Recovery Care/Counseling Consult (No Data) Complete -  Palliative Care Screening Not Applicable Not Applicable Not Applicable  Skilled Nursing Facility Not Applicable Not Applicable -  Some recent data might be hidden

## 2018-10-13 NOTE — Progress Notes (Signed)
[place] [facility] [place] NAME: Shawn [name]    MR#:  [id number]  DATE OF BIRTH:  May 10, 1986  SUBJECTIVE:  CHIEF COMPLAINT:  No chief complaint on file.  Continues to have pain in his foot.   Angiogram showed similar disease of RLE as last angiogram.  REVIEW OF SYSTEMS:    Review of Systems  Constitutional: Positive for malaise/fatigue. Negative for chills and fever.  HENT: Negative for sore throat.   Eyes: Negative for blurred vision, double vision and pain.  Respiratory: Negative for cough, hemoptysis, shortness of breath and wheezing.   Cardiovascular: Negative for chest pain, palpitations, orthopnea and leg swelling.  Gastrointestinal: Negative for abdominal pain, constipation, diarrhea, heartburn, nausea and vomiting.  Genitourinary: Negative for dysuria and hematuria.  Musculoskeletal: Positive for joint pain. Negative for back pain.  Skin: Negative for rash.  Neurological: Negative for sensory change, speech change, focal weakness and headaches.  Endo/Heme/Allergies: Does not bruise/bleed easily.  Psychiatric/Behavioral: Negative for depression. The patient is not nervous/anxious.     DRUG ALLERGIES:   Allergies  Allergen Reactions  . Banana Hives, Nausea And Vomiting and Rash  . Keflex [Cephalexin] Rash    No swelling- also taken penicillin without any issue.  . Sulfa Antibiotics Anaphylaxis  . Grapeseed Extract [Nutritional Supplements] Itching  . Shellfish Allergy Hives    "ALL SEAFOOD"  . Grape Seed Rash    VITALS:  Blood pressure (!) 159/80, pulse 92, temperature 97.9 F (36.6 C), temperature source Oral, resp. rate (!) 25, height 5\' 6"  (1.676 m), weight 52.7 kg, SpO2 100 %.  PHYSICAL EXAMINATION:   Physical Exam  GENERAL:  33 y.o.-year-old patient lying in the bed with no acute distress.  EYES: Pupils equal, round, reactive to light and accommodation. No scleral icterus. Extraocular muscles intact.  HEENT: Head  atraumatic, normocephalic. Oropharynx and nasopharynx clear.  NECK:  Supple, no jugular venous distention. No thyroid enlargement, no tenderness.  LUNGS: Normal breath sounds bilaterally, no wheezing, rales, rhonchi. No use of accessory muscles of respiration.  CARDIOVASCULAR: S1, S2 normal. No murmurs, rubs, or gallops.  ABDOMEN: Soft, nontender, nondistended. Bowel sounds present. No organomegaly or mass.  EXTREMITIES: No cyanosis, clubbing or edema b/l.   Right foot dressing NEUROLOGIC: Cranial nerves II through XII are intact. No focal Motor or sensory deficits b/l.   PSYCHIATRIC: The patient is alert and oriented x 3.  SKIN: No obvious rash, lesion, or ulcer.   LABORATORY PANEL:   CBC No results for input(s): WBC, HGB, HCT, PLT in the last 168 hours. ------------------------------------------------------------------------------------------------------------------ Chemistries  Recent Labs  Lab 10/13/18 0349  NA 134*  K 3.8  CL 112*  CO2 16*  GLUCOSE 168*  BUN 61*  CREATININE 2.30*  CALCIUM 7.9*   ------------------------------------------------------------------------------------------------------------------  Cardiac Enzymes No results for input(s): TROPONINI in the last 168 hours. ------------------------------------------------------------------------------------------------------------------  RADIOLOGY:  Mr Ankle Right Wo Contrast  Result Date: 10/12/2018 CLINICAL DATA:  Skin ulceration on the right ankle for a week or 2. Diabetic patient. EXAM: MRI OF THE RIGHT ANKLE WITHOUT CONTRAST TECHNIQUE: Multiplanar, multisequence MR imaging of the right ankle was performed. No intravenous contrast was administered. COMPARISON:  Plain films the right foot 09/19/2018. FINDINGS: Bones/Joint/Cartilage There is marrow edema in the calcaneus deep to a skin ulceration measuring 1.1 cm long by 0.7 cm craniocaudal by 1.5 cm transverse most compatible with osteomyelitis. Bone marrow signal  is otherwise unremarkable. The patient is status post amputation at the proximal fourth and  fifth metatarsals. Ligaments The anterior talofibular ligament is completely torn. Ligaments about the ankle are otherwise intact. The ATFL tear appears chronic. Muscles and Tendons Intact. Mild thickening of the distal Achilles tendon is noted. No evidence of septic tenosynovitis. 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 Soft tissues Skin ulceration deep skin ulceration on the plantar surface of the foot extends to the plantar fascia. The plantar fascia is intact. No abscess. IMPRESSION: Small focus of marrow edema in the calcaneus deep to a skin ulceration is most consistent with osteomyelitis. Negative for abscess or septic joint. Complete tear of the ATFL appears chronic. Mild appearing Achilles tendinosis. Electronically Signed   By: Inge Rise M.D.   On: 10/12/2018 14:51     ASSESSMENT AND PLAN:   *Uncontrolled diabetes mellitus with hyperglycemia.   Improved on lantus and novolog. SSI  *Chronic right foot ulcers and evaluation for peripheral arterial disease.  Angiogram with similar findings and did not need intervention but small vessel disease. Podiatry consulted and OR planned for tomorrow May need BKA.  *CKD stage III is stable  *Chronic nausea and pain.  Continue home medications.  DVT prophylaxis.  Renally dosed Lovenox  All the records are reviewed and case discussed with Care Management/Social Worker Management plans discussed with the patient, family and they are in agreement.  CODE STATUS: Full code  DVT Prophylaxis: SCDs  TOTAL TIME TAKING CARE OF THIS PATIENT: 35 minutes.   POSSIBLE D/C IN 1-2 DAYS, DEPENDING ON CLINICAL CONDITION.  Leia Alf Lieutenant Abarca M.D on 10/13/2018 at 11:38 AM  Between 7am to 6pm - Pager - [phone]  After 6pm go to [email or web address] - password [email or web address] [facility]   Office  [phone]  CC: Primary care physician; Valera Castle, MD  Note: This dictation was prepared with Dragon dictation along with smaller phrase technology. Any transcriptional errors that result from this process are unintentional.

## 2018-10-13 NOTE — Discharge Instructions (Signed)
Resume diet and activity as before  Dr. Nino Parsley office will call you regarding your procedure on Wednesday.

## 2018-10-16 ENCOUNTER — Encounter (INDEPENDENT_AMBULATORY_CARE_PROVIDER_SITE_OTHER): Payer: Self-pay

## 2018-10-17 ENCOUNTER — Other Ambulatory Visit (INDEPENDENT_AMBULATORY_CARE_PROVIDER_SITE_OTHER): Payer: Self-pay | Admitting: Nurse Practitioner

## 2018-10-17 MED ORDER — CLINDAMYCIN PHOSPHATE 300 MG/50ML IV SOLN
300.0000 mg | INTRAVENOUS | Status: AC
  Administered 2018-10-18: 300 mg via INTRAVENOUS

## 2018-10-18 ENCOUNTER — Other Ambulatory Visit: Payer: Self-pay

## 2018-10-18 ENCOUNTER — Inpatient Hospital Stay: Payer: Medicaid Other | Admitting: Anesthesiology

## 2018-10-18 ENCOUNTER — Inpatient Hospital Stay
Admission: RE | Admit: 2018-10-18 | Discharge: 2018-11-01 | DRG: 239 | Discharge status: Against Medical Advice | Payer: Medicaid Other | Attending: Vascular Surgery | Admitting: Vascular Surgery

## 2018-10-18 ENCOUNTER — Encounter: Payer: Self-pay | Admitting: Anesthesiology

## 2018-10-18 ENCOUNTER — Encounter
Admission: RE | Discharge status: Against Medical Advice | Payer: Self-pay | Source: Home / Self Care | Attending: Vascular Surgery

## 2018-10-18 DIAGNOSIS — L089 Local infection of the skin and subcutaneous tissue, unspecified: Secondary | ICD-10-CM | POA: Diagnosis present

## 2018-10-18 DIAGNOSIS — J9602 Acute respiratory failure with hypercapnia: Secondary | ICD-10-CM | POA: Diagnosis not present

## 2018-10-18 DIAGNOSIS — Z01818 Encounter for other preprocedural examination: Secondary | ICD-10-CM

## 2018-10-18 DIAGNOSIS — Z794 Long term (current) use of insulin: Secondary | ICD-10-CM | POA: Diagnosis not present

## 2018-10-18 DIAGNOSIS — I70269 Atherosclerosis of native arteries of extremities with gangrene, unspecified extremity: Secondary | ICD-10-CM | POA: Diagnosis present

## 2018-10-18 DIAGNOSIS — E8771 Transfusion associated circulatory overload: Secondary | ICD-10-CM | POA: Diagnosis not present

## 2018-10-18 DIAGNOSIS — N183 Chronic kidney disease, stage 3 (moderate): Secondary | ICD-10-CM | POA: Diagnosis present

## 2018-10-18 DIAGNOSIS — E1169 Type 2 diabetes mellitus with other specified complication: Secondary | ICD-10-CM | POA: Diagnosis not present

## 2018-10-18 DIAGNOSIS — J9601 Acute respiratory failure with hypoxia: Secondary | ICD-10-CM | POA: Diagnosis not present

## 2018-10-18 DIAGNOSIS — I129 Hypertensive chronic kidney disease with stage 1 through stage 4 chronic kidney disease, or unspecified chronic kidney disease: Secondary | ICD-10-CM | POA: Diagnosis present

## 2018-10-18 DIAGNOSIS — N179 Acute kidney failure, unspecified: Secondary | ICD-10-CM | POA: Diagnosis present

## 2018-10-18 DIAGNOSIS — J9600 Acute respiratory failure, unspecified whether with hypoxia or hypercapnia: Secondary | ICD-10-CM

## 2018-10-18 DIAGNOSIS — E10621 Type 1 diabetes mellitus with foot ulcer: Secondary | ICD-10-CM | POA: Diagnosis present

## 2018-10-18 DIAGNOSIS — I340 Nonrheumatic mitral (valve) insufficiency: Secondary | ICD-10-CM | POA: Diagnosis not present

## 2018-10-18 DIAGNOSIS — J690 Pneumonitis due to inhalation of food and vomit: Secondary | ICD-10-CM | POA: Diagnosis not present

## 2018-10-18 DIAGNOSIS — E1040 Type 1 diabetes mellitus with diabetic neuropathy, unspecified: Secondary | ICD-10-CM | POA: Diagnosis present

## 2018-10-18 DIAGNOSIS — E1122 Type 2 diabetes mellitus with diabetic chronic kidney disease: Secondary | ICD-10-CM | POA: Diagnosis not present

## 2018-10-18 DIAGNOSIS — E1065 Type 1 diabetes mellitus with hyperglycemia: Secondary | ICD-10-CM | POA: Diagnosis present

## 2018-10-18 DIAGNOSIS — F329 Major depressive disorder, single episode, unspecified: Secondary | ICD-10-CM | POA: Diagnosis present

## 2018-10-18 DIAGNOSIS — R0902 Hypoxemia: Secondary | ICD-10-CM | POA: Diagnosis not present

## 2018-10-18 DIAGNOSIS — Z4659 Encounter for fitting and adjustment of other gastrointestinal appliance and device: Secondary | ICD-10-CM

## 2018-10-18 DIAGNOSIS — D62 Acute posthemorrhagic anemia: Secondary | ICD-10-CM | POA: Diagnosis not present

## 2018-10-18 DIAGNOSIS — Z5329 Procedure and treatment not carried out because of patient's decision for other reasons: Secondary | ICD-10-CM | POA: Diagnosis not present

## 2018-10-18 DIAGNOSIS — Z9889 Other specified postprocedural states: Secondary | ICD-10-CM | POA: Diagnosis not present

## 2018-10-18 DIAGNOSIS — Z8739 Personal history of other diseases of the musculoskeletal system and connective tissue: Secondary | ICD-10-CM | POA: Diagnosis not present

## 2018-10-18 DIAGNOSIS — Y848 Other medical procedures as the cause of abnormal reaction of the patient, or of later complication, without mention of misadventure at the time of the procedure: Secondary | ICD-10-CM | POA: Diagnosis not present

## 2018-10-18 DIAGNOSIS — J181 Lobar pneumonia, unspecified organism: Secondary | ICD-10-CM | POA: Diagnosis not present

## 2018-10-18 DIAGNOSIS — E118 Type 2 diabetes mellitus with unspecified complications: Secondary | ICD-10-CM | POA: Diagnosis not present

## 2018-10-18 DIAGNOSIS — Z20828 Contact with and (suspected) exposure to other viral communicable diseases: Secondary | ICD-10-CM | POA: Diagnosis present

## 2018-10-18 DIAGNOSIS — A419 Sepsis, unspecified organism: Secondary | ICD-10-CM | POA: Diagnosis not present

## 2018-10-18 DIAGNOSIS — Z8614 Personal history of Methicillin resistant Staphylococcus aureus infection: Secondary | ICD-10-CM

## 2018-10-18 DIAGNOSIS — E1022 Type 1 diabetes mellitus with diabetic chronic kidney disease: Secondary | ICD-10-CM | POA: Diagnosis present

## 2018-10-18 DIAGNOSIS — Z881 Allergy status to other antibiotic agents status: Secondary | ICD-10-CM | POA: Diagnosis not present

## 2018-10-18 DIAGNOSIS — Z9119 Patient's noncompliance with other medical treatment and regimen: Secondary | ICD-10-CM

## 2018-10-18 DIAGNOSIS — Z833 Family history of diabetes mellitus: Secondary | ICD-10-CM

## 2018-10-18 DIAGNOSIS — G8929 Other chronic pain: Secondary | ICD-10-CM | POA: Diagnosis present

## 2018-10-18 DIAGNOSIS — K5900 Constipation, unspecified: Secondary | ICD-10-CM | POA: Diagnosis present

## 2018-10-18 DIAGNOSIS — K580 Irritable bowel syndrome with diarrhea: Secondary | ICD-10-CM | POA: Diagnosis present

## 2018-10-18 DIAGNOSIS — F419 Anxiety disorder, unspecified: Secondary | ICD-10-CM | POA: Diagnosis present

## 2018-10-18 DIAGNOSIS — F339 Major depressive disorder, recurrent, unspecified: Secondary | ICD-10-CM | POA: Diagnosis not present

## 2018-10-18 DIAGNOSIS — Z9911 Dependence on respirator [ventilator] status: Secondary | ICD-10-CM | POA: Diagnosis not present

## 2018-10-18 DIAGNOSIS — D631 Anemia in chronic kidney disease: Secondary | ICD-10-CM | POA: Diagnosis present

## 2018-10-18 DIAGNOSIS — Z89429 Acquired absence of other toe(s), unspecified side: Secondary | ICD-10-CM | POA: Diagnosis not present

## 2018-10-18 DIAGNOSIS — Z01810 Encounter for preprocedural cardiovascular examination: Secondary | ICD-10-CM | POA: Diagnosis not present

## 2018-10-18 DIAGNOSIS — K219 Gastro-esophageal reflux disease without esophagitis: Secondary | ICD-10-CM | POA: Diagnosis present

## 2018-10-18 DIAGNOSIS — M869 Osteomyelitis, unspecified: Secondary | ICD-10-CM | POA: Diagnosis present

## 2018-10-18 DIAGNOSIS — E1052 Type 1 diabetes mellitus with diabetic peripheral angiopathy with gangrene: Secondary | ICD-10-CM | POA: Diagnosis present

## 2018-10-18 DIAGNOSIS — R918 Other nonspecific abnormal finding of lung field: Secondary | ICD-10-CM | POA: Diagnosis not present

## 2018-10-18 DIAGNOSIS — R059 Cough, unspecified: Secondary | ICD-10-CM

## 2018-10-18 DIAGNOSIS — Z818 Family history of other mental and behavioral disorders: Secondary | ICD-10-CM

## 2018-10-18 DIAGNOSIS — G546 Phantom limb syndrome with pain: Secondary | ICD-10-CM | POA: Diagnosis not present

## 2018-10-18 DIAGNOSIS — Z8619 Personal history of other infectious and parasitic diseases: Secondary | ICD-10-CM | POA: Diagnosis not present

## 2018-10-18 DIAGNOSIS — Z8631 Personal history of diabetic foot ulcer: Secondary | ICD-10-CM | POA: Diagnosis not present

## 2018-10-18 DIAGNOSIS — Z79899 Other long term (current) drug therapy: Secondary | ICD-10-CM

## 2018-10-18 DIAGNOSIS — Z792 Long term (current) use of antibiotics: Secondary | ICD-10-CM

## 2018-10-18 DIAGNOSIS — E872 Acidosis: Secondary | ICD-10-CM | POA: Diagnosis not present

## 2018-10-18 DIAGNOSIS — R05 Cough: Secondary | ICD-10-CM

## 2018-10-18 DIAGNOSIS — Z89511 Acquired absence of right leg below knee: Secondary | ICD-10-CM | POA: Diagnosis not present

## 2018-10-18 DIAGNOSIS — F332 Major depressive disorder, recurrent severe without psychotic features: Secondary | ICD-10-CM | POA: Diagnosis not present

## 2018-10-18 DIAGNOSIS — Z89422 Acquired absence of other left toe(s): Secondary | ICD-10-CM | POA: Diagnosis not present

## 2018-10-18 DIAGNOSIS — R509 Fever, unspecified: Secondary | ICD-10-CM | POA: Diagnosis not present

## 2018-10-18 DIAGNOSIS — Z8041 Family history of malignant neoplasm of ovary: Secondary | ICD-10-CM

## 2018-10-18 DIAGNOSIS — I96 Gangrene, not elsewhere classified: Secondary | ICD-10-CM

## 2018-10-18 DIAGNOSIS — Z765 Malingerer [conscious simulation]: Secondary | ICD-10-CM

## 2018-10-18 DIAGNOSIS — E11628 Type 2 diabetes mellitus with other skin complications: Secondary | ICD-10-CM | POA: Diagnosis not present

## 2018-10-18 DIAGNOSIS — N189 Chronic kidney disease, unspecified: Secondary | ICD-10-CM | POA: Diagnosis not present

## 2018-10-18 HISTORY — PX: AMPUTATION: SHX166

## 2018-10-18 LAB — CBC WITH DIFFERENTIAL/PLATELET
Abs Immature Granulocytes: 0.04 10*3/uL (ref 0.00–0.07)
Basophils Absolute: 0 10*3/uL (ref 0.0–0.1)
Basophils Relative: 0 %
Eosinophils Absolute: 0.5 10*3/uL (ref 0.0–0.5)
Eosinophils Relative: 4 %
HCT: 26.1 % — ABNORMAL LOW (ref 39.0–52.0)
Hemoglobin: 8.5 g/dL — ABNORMAL LOW (ref 13.0–17.0)
Immature Granulocytes: 0 %
Lymphocytes Relative: 14 %
Lymphs Abs: 1.5 10*3/uL (ref 0.7–4.0)
MCH: 27.5 pg (ref 26.0–34.0)
MCHC: 32.6 g/dL (ref 30.0–36.0)
MCV: 84.5 fL (ref 80.0–100.0)
Monocytes Absolute: 0.9 10*3/uL (ref 0.1–1.0)
Monocytes Relative: 8 %
Neutro Abs: 7.8 10*3/uL — ABNORMAL HIGH (ref 1.7–7.7)
Neutrophils Relative %: 74 %
Platelets: 345 10*3/uL (ref 150–400)
RBC: 3.09 MIL/uL — ABNORMAL LOW (ref 4.22–5.81)
RDW: 13.8 % (ref 11.5–15.5)
WBC: 10.6 10*3/uL — ABNORMAL HIGH (ref 4.0–10.5)
nRBC: 0 % (ref 0.0–0.2)

## 2018-10-18 LAB — BASIC METABOLIC PANEL
Anion gap: 8 (ref 5–15)
BUN: 24 mg/dL — ABNORMAL HIGH (ref 6–20)
CO2: 20 mmol/L — ABNORMAL LOW (ref 22–32)
Calcium: 8.2 mg/dL — ABNORMAL LOW (ref 8.9–10.3)
Chloride: 107 mmol/L (ref 98–111)
Creatinine, Ser: 2.21 mg/dL — ABNORMAL HIGH (ref 0.61–1.24)
GFR calc Af Amer: 44 mL/min — ABNORMAL LOW (ref >60)
GFR calc non Af Amer: 38 mL/min — ABNORMAL LOW (ref >60)
Glucose, Bld: 351 mg/dL — ABNORMAL HIGH (ref 70–99)
Potassium: 3.6 mmol/L (ref 3.5–5.1)
Sodium: 135 mmol/L (ref 135–145)

## 2018-10-18 LAB — URINE DRUG SCREEN, QUALITATIVE (ARMC ONLY)
Amphetamines, Ur Screen: NOT DETECTED
Barbiturates, Ur Screen: NOT DETECTED
Benzodiazepine, Ur Scrn: NOT DETECTED
Cannabinoid 50 Ng, Ur ~~LOC~~: POSITIVE — AB
Cocaine Metabolite,Ur ~~LOC~~: NOT DETECTED
MDMA (Ecstasy)Ur Screen: NOT DETECTED
Methadone Scn, Ur: NOT DETECTED
Opiate, Ur Screen: POSITIVE — AB
Phencyclidine (PCP) Ur S: NOT DETECTED
Tricyclic, Ur Screen: NOT DETECTED

## 2018-10-18 LAB — GLUCOSE, CAPILLARY
Glucose-Capillary: 324 mg/dL — ABNORMAL HIGH (ref 70–99)
Glucose-Capillary: 308 mg/dL — ABNORMAL HIGH (ref 70–99)
Glucose-Capillary: 212 mg/dL — ABNORMAL HIGH (ref 70–99)
Glucose-Capillary: 219 mg/dL — ABNORMAL HIGH (ref 70–99)
Glucose-Capillary: 315 mg/dL — ABNORMAL HIGH (ref 70–99)
Glucose-Capillary: 323 mg/dL — ABNORMAL HIGH (ref 70–99)

## 2018-10-18 LAB — PROTIME-INR
INR: 0.9 (ref 0.8–1.2)
Prothrombin Time: 12 seconds (ref 11.4–15.2)

## 2018-10-18 LAB — APTT: aPTT: 28 seconds (ref 24–36)

## 2018-10-18 SURGERY — AMPUTATION BELOW KNEE
Anesthesia: General | Laterality: Right

## 2018-10-18 MED ORDER — ADULT MULTIVITAMIN W/MINERALS CH
1.0000 | ORAL_TABLET | Freq: Every day | ORAL | Status: DC
  Administered 2018-10-19 – 2018-10-26 (×8): 1 via ORAL
  Filled 2018-10-18 (×8): qty 1

## 2018-10-18 MED ORDER — OXYCODONE HCL 5 MG PO TABS
10.0000 mg | ORAL_TABLET | ORAL | Status: DC | PRN
  Administered 2018-10-18 (×2): 10 mg via ORAL
  Filled 2018-10-18 (×2): qty 2

## 2018-10-18 MED ORDER — SODIUM CHLORIDE 0.9 % IV SOLN
INTRAVENOUS | Status: DC
  Administered 2018-10-18: 10:00:00 via INTRAVENOUS

## 2018-10-18 MED ORDER — INSULIN ASPART 100 UNIT/ML ~~LOC~~ SOLN: 0.0000 [IU] | Freq: Three times a day (TID) | SUBCUTANEOUS | Status: DC

## 2018-10-18 MED ORDER — CLINDAMYCIN PHOSPHATE 300 MG/50ML IV SOLN
INTRAVENOUS | Status: AC
  Filled 2018-10-18: qty 50

## 2018-10-18 MED ORDER — ONDANSETRON HCL 4 MG PO TABS: 4.0000 mg | ORAL_TABLET | Freq: Four times a day (QID) | ORAL | Status: DC | PRN

## 2018-10-18 MED ORDER — OXYCODONE HCL 5 MG PO TABS
5.0000 mg | ORAL_TABLET | Freq: Once | ORAL | Status: AC | PRN
  Administered 2018-10-18: 5 mg via ORAL

## 2018-10-18 MED ORDER — INSULIN GLARGINE 100 UNIT/ML ~~LOC~~ SOLN
28.0000 [IU] | Freq: Two times a day (BID) | SUBCUTANEOUS | Status: DC
  Administered 2018-10-18 – 2018-10-19 (×3): 28 [IU] via SUBCUTANEOUS
  Filled 2018-10-18 (×5): qty 0.28

## 2018-10-18 MED ORDER — MIDAZOLAM HCL 2 MG/2ML IJ SOLN
INTRAMUSCULAR | Status: AC
  Filled 2018-10-18: qty 2

## 2018-10-18 MED ORDER — ALPRAZOLAM 0.25 MG PO TABS
0.2500 mg | ORAL_TABLET | Freq: Three times a day (TID) | ORAL | Status: DC | PRN
  Administered 2018-10-18 – 2018-10-26 (×14): 0.25 mg via ORAL
  Filled 2018-10-18 (×17): qty 1

## 2018-10-18 MED ORDER — DIPHENHYDRAMINE HCL 50 MG/ML IJ SOLN: 12.5000 mg | Freq: Four times a day (QID) | INTRAMUSCULAR | Status: DC | PRN

## 2018-10-18 MED ORDER — FAMOTIDINE 20 MG PO TABS
ORAL_TABLET | ORAL | Status: AC
  Administered 2018-10-18: 10:00:00 20 mg via ORAL
  Filled 2018-10-18: qty 1

## 2018-10-18 MED ORDER — ONDANSETRON HCL 4 MG/2ML IJ SOLN
INTRAMUSCULAR | Status: AC
  Filled 2018-10-18: qty 2

## 2018-10-18 MED ORDER — FENTANYL CITRATE (PF) 100 MCG/2ML IJ SOLN
INTRAMUSCULAR | Status: AC
  Filled 2018-10-18: qty 2

## 2018-10-18 MED ORDER — SUGAMMADEX SODIUM 200 MG/2ML IV SOLN
INTRAVENOUS | Status: AC
  Filled 2018-10-18: qty 2

## 2018-10-18 MED ORDER — INSULIN ASPART 100 UNIT/ML ~~LOC~~ SOLN
0.0000 [IU] | Freq: Three times a day (TID) | SUBCUTANEOUS | Status: DC
  Administered 2018-10-19: 08:00:00 3 [IU] via SUBCUTANEOUS
  Administered 2018-10-19 (×2): 1 [IU] via SUBCUTANEOUS
  Administered 2018-10-20: 17:00:00 2 [IU] via SUBCUTANEOUS
  Administered 2018-10-22: 12:00:00 1 [IU] via SUBCUTANEOUS
  Administered 2018-10-22 – 2018-10-23 (×2): 2 [IU] via SUBCUTANEOUS
  Administered 2018-10-23: 5 [IU] via SUBCUTANEOUS
  Administered 2018-10-23: 2 [IU] via SUBCUTANEOUS
  Administered 2018-10-24 (×2): 1 [IU] via SUBCUTANEOUS
  Filled 2018-10-18 (×11): qty 1

## 2018-10-18 MED ORDER — GABAPENTIN 300 MG PO CAPS
300.0000 mg | ORAL_CAPSULE | Freq: Two times a day (BID) | ORAL | Status: DC
  Administered 2018-10-18 – 2018-10-26 (×16): 300 mg via ORAL
  Filled 2018-10-18 (×17): qty 1

## 2018-10-18 MED ORDER — ONDANSETRON HCL 4 MG/2ML IJ SOLN
INTRAMUSCULAR | Status: DC | PRN
  Administered 2018-10-18: 4 mg via INTRAVENOUS

## 2018-10-18 MED ORDER — ZOLPIDEM TARTRATE 5 MG PO TABS
10.0000 mg | ORAL_TABLET | Freq: Every day | ORAL | Status: DC
  Administered 2018-10-18 – 2018-10-25 (×6): 10 mg via ORAL
  Filled 2018-10-18 (×8): qty 2

## 2018-10-18 MED ORDER — INSULIN SYRINGES (DISPOSABLE) U-100 0.3 ML MISC: 1.0000 | Freq: Three times a day (TID) | Status: DC

## 2018-10-18 MED ORDER — INSULIN ASPART 100 UNIT/ML ~~LOC~~ SOLN
5.0000 [IU] | Freq: Once | SUBCUTANEOUS | Status: AC
  Administered 2018-10-18: 13:00:00 5 [IU] via SUBCUTANEOUS

## 2018-10-18 MED ORDER — SODIUM CHLORIDE 0.9% FLUSH: 9.0000 mL | INTRAVENOUS | Status: DC | PRN

## 2018-10-18 MED ORDER — DIPHENHYDRAMINE HCL 12.5 MG/5ML PO ELIX
12.5000 mg | ORAL_SOLUTION | Freq: Four times a day (QID) | ORAL | Status: DC | PRN
  Filled 2018-10-18: qty 5

## 2018-10-18 MED ORDER — CALCIUM CARBONATE ANTACID 500 MG PO CHEW
500.0000 mg | CHEWABLE_TABLET | Freq: Two times a day (BID) | ORAL | Status: DC
  Administered 2018-10-18 – 2018-10-26 (×15): 500 mg via ORAL
  Filled 2018-10-18 (×15): qty 3

## 2018-10-18 MED ORDER — INSULIN ASPART 100 UNIT/ML ~~LOC~~ SOLN
0.0000 [IU] | Freq: Three times a day (TID) | SUBCUTANEOUS | Status: DC
  Administered 2018-10-18: 16:00:00 15 [IU] via SUBCUTANEOUS
  Filled 2018-10-18: qty 1

## 2018-10-18 MED ORDER — PROMETHAZINE HCL 25 MG/ML IJ SOLN
12.5000 mg | Freq: Four times a day (QID) | INTRAMUSCULAR | Status: DC | PRN
  Administered 2018-10-18 – 2018-10-25 (×17): 12.5 mg via INTRAVENOUS
  Filled 2018-10-18 (×18): qty 1

## 2018-10-18 MED ORDER — ACETAMINOPHEN 325 MG PO TABS
650.0000 mg | ORAL_TABLET | Freq: Four times a day (QID) | ORAL | Status: DC | PRN
  Administered 2018-10-23: 650 mg via ORAL
  Filled 2018-10-18: qty 2

## 2018-10-18 MED ORDER — FERROUS SULFATE 325 (65 FE) MG PO TABS
325.0000 mg | ORAL_TABLET | Freq: Two times a day (BID) | ORAL | Status: DC
  Administered 2018-10-18 – 2018-10-26 (×14): 325 mg via ORAL
  Filled 2018-10-18 (×14): qty 1

## 2018-10-18 MED ORDER — ONDANSETRON HCL 4 MG/2ML IJ SOLN: 4.0000 mg | Freq: Four times a day (QID) | INTRAMUSCULAR | Status: DC | PRN

## 2018-10-18 MED ORDER — BUPIVACAINE LIPOSOME 1.3 % IJ SUSP
INTRAMUSCULAR | Status: AC
  Filled 2018-10-18: qty 20

## 2018-10-18 MED ORDER — NALOXONE HCL 0.4 MG/ML IJ SOLN: 0.4000 mg | INTRAMUSCULAR | Status: DC | PRN

## 2018-10-18 MED ORDER — MIDAZOLAM HCL 2 MG/2ML IJ SOLN
INTRAMUSCULAR | Status: DC | PRN
  Administered 2018-10-18: 2 mg via INTRAVENOUS

## 2018-10-18 MED ORDER — CHLORHEXIDINE GLUCONATE CLOTH 2 % EX PADS: 6.0000 | MEDICATED_PAD | Freq: Once | CUTANEOUS | Status: DC

## 2018-10-18 MED ORDER — HYDROMORPHONE 1 MG/ML IV SOLN
INTRAVENOUS | Status: DC
  Administered 2018-10-19: 1.7 mg via INTRAVENOUS
  Administered 2018-10-19: 1.8 mg via INTRAVENOUS
  Administered 2018-10-19: 1.9 mg via INTRAVENOUS
  Administered 2018-10-19: 25 mg via INTRAVENOUS
  Administered 2018-10-20: 08:00:00 via INTRAVENOUS
  Administered 2018-10-20: 2.08 mg via INTRAVENOUS
  Administered 2018-10-20: 1.8 mg via INTRAVENOUS
  Administered 2018-10-20: 25 mg via INTRAVENOUS
  Administered 2018-10-21: 1.6 mg via INTRAVENOUS
  Administered 2018-10-21: 1.3 mg via INTRAVENOUS
  Administered 2018-10-21: 1.5 mg via INTRAVENOUS
  Administered 2018-10-21: 1.7 mg via INTRAVENOUS
  Administered 2018-10-21: 1.6 mg via INTRAVENOUS
  Filled 2018-10-18 (×2): qty 25

## 2018-10-18 MED ORDER — INSULIN ASPART 100 UNIT/ML ~~LOC~~ SOLN
0.0000 [IU] | Freq: Every day | SUBCUTANEOUS | Status: DC
  Administered 2018-10-18: 4 [IU] via SUBCUTANEOUS
  Administered 2018-10-21: 22:00:00 2 [IU] via SUBCUTANEOUS
  Filled 2018-10-18 (×2): qty 1

## 2018-10-18 MED ORDER — INSULIN ASPART 100 UNIT/ML ~~LOC~~ SOLN
10.0000 [IU] | Freq: Once | SUBCUTANEOUS | Status: AC
  Administered 2018-10-18: 10 [IU] via SUBCUTANEOUS

## 2018-10-18 MED ORDER — HYDROMORPHONE HCL 1 MG/ML IJ SOLN
1.0000 mg | INTRAMUSCULAR | Status: DC | PRN
  Administered 2018-10-18 (×4): 1 mg via INTRAVENOUS
  Filled 2018-10-18 (×4): qty 1

## 2018-10-18 MED ORDER — FENTANYL CITRATE (PF) 100 MCG/2ML IJ SOLN
25.0000 ug | INTRAMUSCULAR | Status: DC | PRN
  Administered 2018-10-18 (×4): 50 ug via INTRAVENOUS

## 2018-10-18 MED ORDER — BUPIVACAINE LIPOSOME 1.3 % IJ SUSP
INTRAMUSCULAR | Status: DC | PRN
  Administered 2018-10-18: 50 mL

## 2018-10-18 MED ORDER — FENTANYL CITRATE (PF) 100 MCG/2ML IJ SOLN
INTRAMUSCULAR | Status: AC
  Administered 2018-10-18: 13:00:00 50 ug via INTRAVENOUS
  Filled 2018-10-18: qty 2

## 2018-10-18 MED ORDER — ACETAMINOPHEN 650 MG RE SUPP: 650.0000 mg | Freq: Four times a day (QID) | RECTAL | Status: DC | PRN

## 2018-10-18 MED ORDER — PROPOFOL 10 MG/ML IV BOLUS
INTRAVENOUS | Status: AC
  Filled 2018-10-18: qty 20

## 2018-10-18 MED ORDER — PROPOFOL 10 MG/ML IV BOLUS
INTRAVENOUS | Status: DC | PRN
  Administered 2018-10-18: 140 mg via INTRAVENOUS

## 2018-10-18 MED ORDER — INSULIN ASPART 100 UNIT/ML ~~LOC~~ SOLN: 0.0000 [IU] | Freq: Every day | SUBCUTANEOUS | Status: DC

## 2018-10-18 MED ORDER — SODIUM CHLORIDE 0.9 % IV SOLN
INTRAVENOUS | Status: DC
  Administered 2018-10-18: 15:00:00 via INTRAVENOUS

## 2018-10-18 MED ORDER — OXYCODONE HCL 5 MG/5ML PO SOLN: 5.0000 mg | Freq: Once | ORAL | Status: AC | PRN

## 2018-10-18 MED ORDER — DOCUSATE SODIUM 100 MG PO CAPS
100.0000 mg | ORAL_CAPSULE | Freq: Two times a day (BID) | ORAL | Status: DC
  Administered 2018-10-21: 100 mg via ORAL
  Filled 2018-10-18 (×8): qty 1

## 2018-10-18 MED ORDER — FENTANYL CITRATE (PF) 100 MCG/2ML IJ SOLN
INTRAMUSCULAR | Status: DC | PRN
  Administered 2018-10-18: 50 ug via INTRAVENOUS
  Administered 2018-10-18 (×2): 25 ug via INTRAVENOUS
  Administered 2018-10-18: 50 ug via INTRAVENOUS
  Administered 2018-10-18 (×2): 25 ug via INTRAVENOUS

## 2018-10-18 MED ORDER — DIPHENOXYLATE-ATROPINE 2.5-0.025 MG PO TABS
1.0000 | ORAL_TABLET | Freq: Four times a day (QID) | ORAL | Status: DC | PRN
  Administered 2018-10-22 (×3): 1 via ORAL
  Filled 2018-10-18 (×3): qty 1

## 2018-10-18 MED ORDER — CLINDAMYCIN PHOSPHATE 300 MG/50ML IV SOLN
300.0000 mg | Freq: Three times a day (TID) | INTRAVENOUS | Status: AC
  Administered 2018-10-18 – 2018-10-19 (×4): 300 mg via INTRAVENOUS
  Filled 2018-10-18 (×4): qty 50

## 2018-10-18 MED ORDER — LIDOCAINE HCL (PF) 2 % IJ SOLN
INTRAMUSCULAR | Status: AC
  Filled 2018-10-18: qty 10

## 2018-10-18 MED ORDER — VITAMIN B-12 1000 MCG PO TABS
5000.0000 ug | ORAL_TABLET | Freq: Every day | ORAL | Status: DC
  Administered 2018-10-18 – 2018-11-01 (×14): 5000 ug via ORAL
  Filled 2018-10-18 (×14): qty 5

## 2018-10-18 MED ORDER — DEXAMETHASONE SODIUM PHOSPHATE 10 MG/ML IJ SOLN
INTRAMUSCULAR | Status: AC
  Filled 2018-10-18: qty 1

## 2018-10-18 MED ORDER — INSULIN ASPART 100 UNIT/ML ~~LOC~~ SOLN
SUBCUTANEOUS | Status: AC
  Filled 2018-10-18: qty 1

## 2018-10-18 MED ORDER — PHENYLEPHRINE HCL (PRESSORS) 10 MG/ML IV SOLN
INTRAVENOUS | Status: DC | PRN
  Administered 2018-10-18: 200 ug via INTRAVENOUS
  Administered 2018-10-18: 100 ug via INTRAVENOUS
  Administered 2018-10-18 (×5): 200 ug via INTRAVENOUS

## 2018-10-18 MED ORDER — MAGNESIUM HYDROXIDE 400 MG/5ML PO SUSP: 30.0000 mL | Freq: Every day | ORAL | Status: DC | PRN

## 2018-10-18 MED ORDER — GLUCERNA SHAKE PO LIQD
237.0000 mL | Freq: Three times a day (TID) | ORAL | Status: DC
  Administered 2018-10-18 – 2018-10-25 (×6): 237 mL via ORAL

## 2018-10-18 MED ORDER — SORBITOL 70 % SOLN: 30.0000 mL | Freq: Every day | Status: DC | PRN

## 2018-10-18 MED ORDER — ROCURONIUM BROMIDE 100 MG/10ML IV SOLN
INTRAVENOUS | Status: DC | PRN
  Administered 2018-10-18: 30 mg via INTRAVENOUS
  Administered 2018-10-18: 20 mg via INTRAVENOUS

## 2018-10-18 MED ORDER — FLEET ENEMA 7-19 GM/118ML RE ENEM: 1.0000 | ENEMA | Freq: Once | RECTAL | Status: DC | PRN

## 2018-10-18 MED ORDER — INSULIN GLARGINE 100 UNIT/ML ~~LOC~~ SOLN
12.0000 [IU] | Freq: Two times a day (BID) | SUBCUTANEOUS | Status: DC
  Filled 2018-10-18 (×2): qty 0.12

## 2018-10-18 MED ORDER — SUGAMMADEX SODIUM 200 MG/2ML IV SOLN
INTRAVENOUS | Status: DC | PRN
  Administered 2018-10-18: 100 mg via INTRAVENOUS

## 2018-10-18 MED ORDER — SEVOFLURANE IN SOLN
RESPIRATORY_TRACT | Status: AC
  Filled 2018-10-18: qty 250

## 2018-10-18 MED ORDER — SUCCINYLCHOLINE CHLORIDE 20 MG/ML IJ SOLN
INTRAMUSCULAR | Status: AC
  Filled 2018-10-18: qty 1

## 2018-10-18 MED ORDER — LIDOCAINE HCL (CARDIAC) PF 100 MG/5ML IV SOSY
PREFILLED_SYRINGE | INTRAVENOUS | Status: DC | PRN
  Administered 2018-10-18: 100 mg via INTRAVENOUS

## 2018-10-18 MED ORDER — VITAMIN C 500 MG PO TABS
250.0000 mg | ORAL_TABLET | Freq: Two times a day (BID) | ORAL | Status: DC
  Administered 2018-10-18 – 2018-10-26 (×14): 250 mg via ORAL
  Filled 2018-10-18 (×12): qty 1

## 2018-10-18 MED ORDER — ROCURONIUM BROMIDE 50 MG/5ML IV SOLN
INTRAVENOUS | Status: AC
  Filled 2018-10-18: qty 1

## 2018-10-18 MED ORDER — ENOXAPARIN SODIUM 40 MG/0.4ML ~~LOC~~ SOLN
40.0000 mg | SUBCUTANEOUS | Status: DC
  Administered 2018-10-18 – 2018-10-20 (×3): 40 mg via SUBCUTANEOUS
  Filled 2018-10-18 (×3): qty 0.4

## 2018-10-18 MED ORDER — FAMOTIDINE 20 MG PO TABS
20.0000 mg | ORAL_TABLET | Freq: Once | ORAL | Status: AC
  Administered 2018-10-18: 10:00:00 20 mg via ORAL

## 2018-10-18 MED ORDER — OXYCODONE HCL 5 MG PO TABS
ORAL_TABLET | ORAL | Status: AC
  Filled 2018-10-18: qty 2

## 2018-10-18 MED ORDER — INSULIN ASPART 100 UNIT/ML ~~LOC~~ SOLN
4.0000 [IU] | Freq: Three times a day (TID) | SUBCUTANEOUS | Status: DC
  Administered 2018-10-19 – 2018-10-24 (×14): 4 [IU] via SUBCUTANEOUS
  Filled 2018-10-18 (×14): qty 1

## 2018-10-18 MED ORDER — BUPIVACAINE HCL (PF) 0.5 % IJ SOLN
INTRAMUSCULAR | Status: AC
  Filled 2018-10-18: qty 30

## 2018-10-18 MED ORDER — PROMETHAZINE HCL 25 MG PO TABS: 25.0000 mg | ORAL_TABLET | Freq: Four times a day (QID) | ORAL | Status: DC | PRN

## 2018-10-18 MED ORDER — ONDANSETRON HCL 4 MG/2ML IJ SOLN
4.0000 mg | Freq: Four times a day (QID) | INTRAMUSCULAR | Status: DC | PRN
  Administered 2018-10-20 – 2018-10-21 (×2): 4 mg via INTRAVENOUS
  Filled 2018-10-18 (×2): qty 2

## 2018-10-18 SURGICAL SUPPLY — 54 items
BAG COUNTER SPONGE EZ (MISCELLANEOUS) ×2 IMPLANT
BANDAGE ELASTIC 4 LF NS (GAUZE/BANDAGES/DRESSINGS) ×3 IMPLANT
BLADE SAGITTAL WIDE XTHICK NO (BLADE) ×3 IMPLANT
BLADE SURG SZ10 CARB STEEL (BLADE) ×3 IMPLANT
BNDG COHESIVE 4X5 TAN STRL (GAUZE/BANDAGES/DRESSINGS) ×3 IMPLANT
BNDG GAUZE 4.5X4.1 6PLY STRL (MISCELLANEOUS) ×3 IMPLANT
BRUSH SCRUB EZ  4% CHG (MISCELLANEOUS) ×2
BRUSH SCRUB EZ 4% CHG (MISCELLANEOUS) ×1 IMPLANT
CANISTER SUCT 1200ML W/VALVE (MISCELLANEOUS) ×3 IMPLANT
CANISTER WOUND CARE 500ML ATS (WOUND CARE) IMPLANT
CHLORAPREP W/TINT 26 (MISCELLANEOUS) ×3 IMPLANT
COUNTER SPONGE BAG EZ (MISCELLANEOUS) ×1
COVER WAND RF STERILE (DRAPES) ×3 IMPLANT
DERMABOND ADVANCED (GAUZE/BANDAGES/DRESSINGS) ×4
DERMABOND ADVANCED .7 DNX12 (GAUZE/BANDAGES/DRESSINGS) ×2 IMPLANT
DRAPE INCISE IOBAN 66X45 STRL (DRAPES) ×3 IMPLANT
DRSG GAUZE FLUFF 36X18 (GAUZE/BANDAGES/DRESSINGS) ×3 IMPLANT
DRSG VAC ATS MED SENSATRAC (GAUZE/BANDAGES/DRESSINGS) IMPLANT
ELECT CAUTERY BLADE 6.4 (BLADE) ×3 IMPLANT
ELECT REM PT RETURN 9FT ADLT (ELECTROSURGICAL) ×3
ELECTRODE REM PT RTRN 9FT ADLT (ELECTROSURGICAL) ×1 IMPLANT
GAUZE PETRO XEROFOAM 1X8 (MISCELLANEOUS) IMPLANT
GLOVE BIO SURGEON STRL SZ7 (GLOVE) ×3 IMPLANT
GLOVE INDICATOR 7.5 STRL GRN (GLOVE) ×3 IMPLANT
GLOVE SURG SYN 8.0 (GLOVE) ×3 IMPLANT
GOWN STRL REUS W/ TWL LRG LVL3 (GOWN DISPOSABLE) ×2 IMPLANT
GOWN STRL REUS W/ TWL XL LVL3 (GOWN DISPOSABLE) ×1 IMPLANT
GOWN STRL REUS W/TWL LRG LVL3 (GOWN DISPOSABLE) ×4
GOWN STRL REUS W/TWL XL LVL3 (GOWN DISPOSABLE) ×2
HANDLE YANKAUER SUCT BULB TIP (MISCELLANEOUS) ×3 IMPLANT
KIT TURNOVER KIT A (KITS) ×3 IMPLANT
LABEL OR SOLS (LABEL) ×3 IMPLANT
NDL SAFETY 22GX1.5 (NEEDLE) ×3 IMPLANT
NS IRRIG 500ML POUR BTL (IV SOLUTION) ×3 IMPLANT
PACK EXTREMITY ARMC (MISCELLANEOUS) ×3 IMPLANT
PAD ABD DERMACEA PRESS 5X9 (GAUZE/BANDAGES/DRESSINGS) ×3 IMPLANT
PAD PREP 24X41 OB/GYN DISP (PERSONAL CARE ITEMS) ×3 IMPLANT
SPONGE LAP 18X18 RF (DISPOSABLE) ×9 IMPLANT
STAPLER SKIN PROX 35W (STAPLE) IMPLANT
STOCKINETTE M/LG 89821 (MISCELLANEOUS) ×3 IMPLANT
SUT MNCRL 4-0 (SUTURE) ×2
SUT MNCRL 4-0 27XMFL (SUTURE) ×1
SUT SILK 2 0 (SUTURE) ×2
SUT SILK 2-0 18XBRD TIE 12 (SUTURE) ×1 IMPLANT
SUT SILK 3 0 (SUTURE) ×2
SUT SILK 3-0 18XBRD TIE 12 (SUTURE) ×1 IMPLANT
SUT VIC AB 0 CT1 36 (SUTURE) ×15 IMPLANT
SUT VIC AB 3-0 SH 27 (SUTURE) ×4
SUT VIC AB 3-0 SH 27X BRD (SUTURE) ×2 IMPLANT
SUT VICRYL PLUS ABS 0 54 (SUTURE) ×3 IMPLANT
SUTURE MNCRL 4-0 27XMF (SUTURE) ×1 IMPLANT
SYR 20CC LL (SYRINGE) ×6 IMPLANT
TAPE UMBIL 1/8X18 RADIOPA (MISCELLANEOUS) ×3 IMPLANT
TOWEL OR 17X26 4PK STRL BLUE (TOWEL DISPOSABLE) ×6 IMPLANT

## 2018-10-18 NOTE — OR Nursing (Signed)
Patient is drinking and tolerating well, he is very tearful and does not want to see his leg.  He states that he has chronic pain and his pain score stays at a 7.

## 2018-10-18 NOTE — Progress Notes (Signed)
profend not done due to shellfish allergy

## 2018-10-18 NOTE — Progress Notes (Signed)
Per Dr. Estanislado Pandy okay for RN to DC insulin sliding scale orders and Lantus. He will place new orders base in diabetes coordinator recommendations,

## 2018-10-18 NOTE — Anesthesia Procedure Notes (Signed)
Procedure Name: Intubation Date/Time: 10/18/2018 10:47 AM Performed by: Doreen Salvage, CRNA Pre-anesthesia Checklist: Patient identified, Patient being monitored, Timeout performed, Emergency Drugs available and Suction available Patient Re-evaluated:Patient Re-evaluated prior to induction Oxygen Delivery Method: Circle system utilized Preoxygenation: Pre-oxygenation with 100% oxygen Induction Type: IV induction Ventilation: Mask ventilation without difficulty Laryngoscope Size: Mac, 3 and McGraph Grade View: Grade I Tube type: Oral Tube size: 7.0 mm Number of attempts: 1 Airway Equipment and Method: Stylet Placement Confirmation: ETT inserted through vocal cords under direct vision,  positive ETCO2 and breath sounds checked- equal and bilateral Secured at: 21 cm Tube secured with: Tape Dental Injury: Teeth and Oropharynx as per pre-operative assessment

## 2018-10-18 NOTE — Anesthesia Preprocedure Evaluation (Signed)
Anesthesia Evaluation  Patient identified by MRN, date of birth, ID band Patient awake    Reviewed: Allergy & Precautions, H&P , NPO status , Patient's Chart, lab work & pertinent test results  History of Anesthesia Complications Negative for: history of anesthetic complications  Airway Mallampati: II  TM Distance: >3 FB Neck ROM: full    Dental  (+) Chipped, Poor Dentition, Missing   Pulmonary neg pulmonary ROS, neg shortness of breath,           Cardiovascular Exercise Tolerance: Good hypertension, (-) angina(-) Past MI and (-) DOE      Neuro/Psych PSYCHIATRIC DISORDERS  Neuromuscular disease negative psych ROS   GI/Hepatic Neg liver ROS, GERD  Medicated and Controlled,  Endo/Other  diabetes, Poorly Controlled, Insulin Dependent  Renal/GU CRFRenal disease     Musculoskeletal   Abdominal   Peds  Hematology negative hematology ROS (+)   Anesthesia Other Findings Past Medical History: No date: CKD (chronic kidney disease) No date: Diabetes mellitus without complication (HCC) No date: GERD (gastroesophageal reflux disease) No date: HTN (hypertension) No date: IBS (irritable bowel syndrome) No date: Osteomyelitis ([place])  Past Surgical History: 12/23/2017: ABDOMINAL AORTOGRAM W/LOWER EXTREMITY; Right     Comment:  Procedure: ABDOMINAL AORTOGRAM W/LOWER EXTREMITY;                Surgeon: Katha Cabal, MD;  Location: [place]              CV LAB;  Service: Cardiovascular;  Laterality: Right; 06/16/2018: ACHILLES TENDON SURGERY; Bilateral     Comment:  Procedure: ACHILLES LENGTHENING/KIDNER;  Surgeon:               Albertine Patricia, DPM;  Location: [facility] OR[facility];  Service:               Podiatry;  Laterality: Bilateral; 12/24/2017: AMPUTATION; Right     Comment:  Procedure: AMPUTATION RAY;  Surgeon: Sharlotte Alamo, DPM;                Location: [facility];  Service: Podiatry;  Laterality:                Right; 04/06/2018: AMPUTATION; Left     Comment:  Procedure: AMPUTATION RAY;  Surgeon: Sharlotte Alamo, DPM;                Location: [facility];  Service: Podiatry;  Laterality:               Left; 04/09/2018: AMPUTATION; Left     Comment:  Procedure: AMPUTATION RAY;  Surgeon: Sharlotte Alamo, DPM;                Location: [facility];  Service: Podiatry;  Laterality:               Left; 9/38/1017: APPLICATION OF WOUND VAC; Right     Comment:  Procedure: APPLICATION OF WOUND VAC;  Surgeon: Sharlotte Alamo, DPM;  Location: [facility];  Service: Podiatry;                Laterality: Right; 11/12/2583: APPLICATION OF WOUND VAC; Right     Comment:  Procedure: APPLICATION OF WOUND VAC;  Surgeon: Albertine Patricia, DPM;  Location: [facility];  Service: Podiatry;  Laterality: Right; 06/16/2018: BONE EXCISION; Bilateral     Comment:  Procedure: BONE EXCISION AND SOFT TISSUE;  Surgeon:               Albertine Patricia, DPM;  Location: [facility];  Service:               Podiatry;  Laterality: Bilateral; 12/20/2017: IRRIGATION AND DEBRIDEMENT FOOT; Right     Comment:  Procedure: IRRIGATION AND DEBRIDEMENT FOOT;  Surgeon:               Sharlotte Alamo, DPM;  Location: [facility];  Service:               Podiatry;  Laterality: Right; 12/24/2017: IRRIGATION AND DEBRIDEMENT FOOT; Right     Comment:  Procedure: IRRIGATION AND DEBRIDEMENT FOOT;  Surgeon:               Sharlotte Alamo, DPM;  Location: [facility];  Service:               Podiatry;  Laterality: Right; 01/01/2018: IRRIGATION AND DEBRIDEMENT FOOT; Right     Comment:  Procedure: IRRIGATION AND DEBRIDEMENT FOOT-SKIN,SOFT               TISSUE AND BONE;  Surgeon: Albertine Patricia, DPM;                Location: [facility];  Service: Podiatry;  Laterality:               Right; 04/06/2018: IRRIGATION AND DEBRIDEMENT FOOT; Left     Comment:  Procedure: IRRIGATION AND DEBRIDEMENT FOOT;  Surgeon:               Sharlotte Alamo, DPM;  Location: [facility];   Service:               Podiatry;  Laterality: Left; 09/20/2018: IRRIGATION AND DEBRIDEMENT FOOT; Right     Comment:  Procedure: IRRIGATION AND DEBRIDEMENT FOOT;  Surgeon:               Sharlotte Alamo, DPM;  Location: [facility];  Service:               Podiatry;  Laterality: Right; 04/06/2018: LOWER EXTREMITY ANGIOGRAPHY; Left     Comment:  Procedure: Lower Extremity Angiography;  Surgeon: Algernon Huxley, MD;  Location: [place] CV LAB;  Service:               Cardiovascular;  Laterality: Left; 10/13/2018: LOWER EXTREMITY ANGIOGRAPHY; Bilateral     Comment:  Procedure: Lower Extremity Angiography;  Surgeon:               Katha Cabal, MD;  Location: [place] CV [facility];               Service: Cardiovascular;  Laterality: Bilateral;  BMI    Body Mass Index:  18.75 kg/m      Reproductive/Obstetrics negative OB ROS                             Anesthesia Physical Anesthesia Plan  ASA: IV  Anesthesia Plan: General ETT   Post-op Pain Management:    Induction: Intravenous  PONV Risk Score and Plan: Ondansetron, Dexamethasone, Midazolam and Treatment may vary due to age or medical condition  Airway Management Planned: Oral ETT  Additional Equipment:  Intra-op Plan:   Post-operative Plan: Extubation in OR  Informed Consent: I have reviewed the patients History and Physical, chart, labs and discussed the procedure including the risks, benefits and alternatives for the proposed anesthesia with the patient or authorized representative who has indicated his/her understanding and acceptance.     Dental Advisory Given  Plan Discussed with: Anesthesiologist, CRNA and Surgeon  Anesthesia Plan Comments: (Patient consented for risks of anesthesia including but not limited to:  - adverse reactions to medications - damage to teeth, lips or other oral mucosa - sore throat or hoarseness - Damage to heart, brain, lungs or loss of  life  Patient voiced understanding.)        Anesthesia Quick Evaluation

## 2018-10-18 NOTE — Transfer of Care (Signed)
Immediate Anesthesia Transfer of Care Note  Patient: Shawn [name]  Procedure(s) Performed: AMPUTATION BELOW KNEE (Right )  Patient Location: PACU  Anesthesia Type:General  Level of Consciousness: awake, alert, oriented  Airway & Oxygen Therapy: Patient Spontanous Breathing  Post-op Assessment: Report given to RN and Post -op Vital signs reviewed and stable  Post vital signs: Reviewed and stable  Last Vitals:  Vitals Value Taken Time  BP 117/78 10/18/2018 12:41 PM  Temp    Pulse 92 10/18/2018 12:41 PM  Resp 9 10/18/2018 12:41 PM  SpO2 98 % 10/18/2018 12:41 PM  Vitals shown include unvalidated device data.  Last Pain:  Vitals:   10/18/18 1241  TempSrc:   PainSc: 0-No pain         Complications: No apparent anesthesia complications

## 2018-10-18 NOTE — Progress Notes (Addendum)
Inpatient Diabetes Program Recommendations  AACE/ADA: New Consensus Statement on Inpatient Glycemic Control (2015)  Target Ranges:  Prepandial:   less than 140 mg/dL      Peak postprandial:   less than 180 mg/dL (1-2 hours)      Critically ill patients:  140 - 180 mg/dL   Results for Shawn [name], Shawn [name] (MRN [id number]) as of 10/18/2018 13:23  Ref. 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  This is pt's 5th admission since January of this year.  Counseled on previous admissions about the importance of good glucose control at home.  Note admission for BKA today.   MD- Patient will need orders for Lantus, Novolog SSI, and Novolog Meal Coverage since he has History of Type 1 diabetes.  Please consider the following:  1. Start Lantus 28 units BID--Please start today    This would be 80% total home dose to start  2. Start Novolog Sensitive Correction Scale/ SSI (0-9 units) TID AC + HS  3. If allowed PO diet Post-Op, please also start Novolog 4 units TID with meals  (Please add the following Hold Parameters: Hold if pt eats <50% of meal, Hold if pt NPO)     --Will follow patient during hospitalization--  Wyn Quaker RN, MSN, CDE Diabetes Coordinator Inpatient Glycemic Control Team Team Pager: [phone] (8a-5p)

## 2018-10-18 NOTE — Progress Notes (Signed)
Initial Nutrition Assessment  DOCUMENTATION CODES:   Severe malnutrition in context of chronic illness  INTERVENTION:   Glucerna Shake po TID, each supplement provides 220 kcal and 10 grams of protein  MVI daily   Vitamin C 500mg  po BID  NUTRITION DIAGNOSIS:   Severe Malnutrition related to chronic illness(uncontrolled DM) as evidenced by moderate to severe fat depletions, moderate to severe muscle depletions.  GOAL:   Patient will meet greater than or equal to 90% of their needs  MONITOR:   PO intake, Supplement acceptance, Weight trends, Labs, Skin, I & O's  REASON FOR ASSESSMENT:   Malnutrition Screening Tool    ASSESSMENT:   33 y.o. 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  Pt is well known to nutrition department and this RD from multiple previous admits. Pt is generally a good eater while hospitalized and pt enjoys Glucerna as he drinks these at home. RD will add supplements and vitamins to support post op healing. Per chart, pt appears fairly weight stable pta.   Medications reviewed and include: tums, colace, ferrous sulfate, lovenox, insulin, fentanyl, B12, vitamin C  Labs reviewed: BUN 24(H), creat 2.21(H) Wbc- 10.6(H), Hgb 8.5(L), Hct 26.1(L) cbgs- 324, 308, 212, 219 x 24 hrs AIC 13.7(H)- 3/18   Diet Order:   Diet Order            Diet Carb Modified Fluid consistency: Thin; Room service appropriate? Yes  Diet effective now             EDUCATION NEEDS:   Not appropriate for education at this time  Skin:  Skin Assessment: Reviewed RN Assessment(incision s/p R BKA, Stage II sacrum)  Last BM:  pta  Height:   Ht Readings from Last 1 Encounters:  10/18/18 5\' 6"  (1.676 m)    Weight:   Wt Readings from Last 1 Encounters:  10/18/18 52.7 kg    Ideal Body Weight:  60.6 kg(adjusted for BKA)  BMI:  Body mass index is 18.75 kg/m.  Estimated  Nutritional Needs:   Kcal:  1900-2200kcal/day   Protein:  95-110  Fluid:  >1.5L/day   [name] MS, RD, LDN Pager #- [phone] Office#- [phone] After Hours Pager: [phone]

## 2018-10-18 NOTE — Op Note (Signed)
OPERATIVE NOTE   PROCEDURE: Right below-the-knee amputation  PRE-OPERATIVE DIAGNOSIS: Right foot gangrene  POST-OPERATIVE DIAGNOSIS: same as above  SURGEON: Katha Cabal, MD  ASSISTANT(S): Ms. Hezzie Bump  ANESTHESIA: general  ESTIMATED BLOOD LOSS: 200 cc  FINDING(S): Viable muscle bellies  SPECIMEN(S):  Right below-the-knee amputation  INDICATIONS:   Shawn [name] is a 33 y.o. male who presents with right leg gangrene.  The patient is scheduled for a right below-the-knee amputation.  I discussed in depth with the patient the risks, benefits, and alternatives to this procedure.  The patient is aware that the risk of this operation included but are not limited to:  bleeding, infection, myocardial infarction, stroke, death, failure to heal amputation wound, and possible need for more proximal amputation.  The patient is aware of the risks and agrees proceed forward with the procedure.  DESCRIPTION:  After full informed written consent was obtained from the patient, the patient was brought back to the operating room, and placed supine upon the operating table.  Prior to induction, the patient received IV antibiotics.    The patient was then prepped and draped in the standard fashion for a below-the-knee amputation.  After obtaining adequate anesthesia, the patient was prepped and draped in the standard fashion for a right below-the-knee amputation.   A first assistant was required her duties included retraction suction to keep the surgical field clear as well as assistance with suture ligation of vessels and closing of the wound.  All of these processes were performed throughout the case as needed and with her assistance the case proceeded in an orderly and more safe fashion  I  marked out the anterior incision two finger breadths below the tibial tuberosity and then the marked out a posterior flap that was one third of the circumference of the calf in length.   I made  the incisions for these flaps, and then dissected through the subcutaneous tissue, fascia, and muscle anteriorly.  I elevated  the periosteal tissue superiorly so that the tibia was about 3-4 cm shorter than the anterior skin flap.  I then transected the tibia with a power saw and then took a wedge off the tibia anteriorly with the power saw.  Then I smoothed out the rough edges.  In a similar fashion, I cut back the fibula about two centimeters higher than the level of the tibia with a bone cutter.  I put a bone hook into the distal tibia and then used a large amputation knife to sharply develop a tissue plane through the muscle along the fibula.  In such fashion, the posterior flap was developed.  At this point, the specimen was passed off the field as the below-the-knee amputation.  At this point, I clamped all visibly bleeding arteries and veins using a combination of suture ligation with Silk suture and electrocautery.  Bleeding continued to be controlled with electrocautery and suture ligature.  The stump was washed off with sterile normal saline and no further active bleeding was noted.  I reapproximated the anterior and posterior fascia  with interrupted stitches of 0 Vicryl.  This was completed along the entire length of anterior and posterior fascia until there were no more loose space in the fascial line. I then placed a layer of 2-0 Vicryl sutures in the subcutaneous tissue. The skin was then  reapproximated with 4-0 Monocryl subcuticular.  The stump was washed off and dried.  The incision was dressed with Dermabond and  then fluffs were  applied.  Kerlix was wrapped around the leg and then gently an ACE wrap was applied.    COMPLICATIONS: none  CONDITION: stable   Shawn [name]  10/18/2018, 12:43 PM    This note was created with Dragon Medical transcription system. Any errors in dictation are purely unintentional.

## 2018-10-18 NOTE — H&P (Signed)
[facility] History & Physical Update  The patient was interviewed and re-examined.  The patient's previous History and Physical has been reviewed and is unchanged.  There is no change in the plan of care. We plan to proceed with the scheduled procedure.  Hortencia Pilar, MD  10/18/2018, 10:27 AM

## 2018-10-18 NOTE — Consult Note (Signed)
[place] Consultation  Shawn [name] FHL:[id number] DOB: [birth year] DOA: 10/18/2018 PCP: Valera Castle, MD   Requesting physician: Ronalee Belts MD Date of consultation: 10/18/2018 Reason for consultation: Uncontrolled blood sugars  CHIEF COMPLAINT: Uncontrolled blood sugars  HISTORY OF PRESENT ILLNESS: Shawn [name]  is a 33 y.o. male with a known history of chronic kidney disease, type 2 diabetes mellitus, GERD, hypertension, inflammatory bowel syndrome, osteomyelitis currently under vascular surgery service.Patient had right below the knee amputation today.  Patient tolerated the procedure well.  Blood sugars have been elevated and hospitalist service was consulted.  Patient has polyuria and polydipsia.  PAST MEDICAL HISTORY:   Past Medical History:  Diagnosis Date  . CKD (chronic kidney disease)   . Diabetes mellitus without complication ([place])   . GERD (gastroesophageal reflux disease)   . HTN (hypertension)   . IBS (irritable bowel syndrome)   . Osteomyelitis ([place])     PAST SURGICAL HISTORY:  Past Surgical History:  Procedure Laterality Date  . ABDOMINAL AORTOGRAM W/LOWER EXTREMITY Right 12/23/2017   Procedure: ABDOMINAL AORTOGRAM W/LOWER EXTREMITY;  Surgeon: Katha Cabal, MD;  Location: [facility];  Service: Cardiovascular;  Laterality: Right;  . ACHILLES TENDON SURGERY Bilateral 06/16/2018   Procedure: ACHILLES LENGTHENING/KIDNER;  Surgeon: Albertine Patricia, DPM;  Location: [facility];  Service: Podiatry;  Laterality: Bilateral;  . AMPUTATION Right 12/24/2017   Procedure: AMPUTATION RAY;  Surgeon: Sharlotte Alamo, DPM;  Location: [facility];  Service: Podiatry;  Laterality: Right;  . AMPUTATION Left 04/06/2018   Procedure: AMPUTATION RAY;  Surgeon: Sharlotte Alamo, DPM;  Location: [facility];  Service: Podiatry;  Laterality: Left;  . AMPUTATION Left 04/09/2018   Procedure: AMPUTATION RAY;  Surgeon: Sharlotte Alamo, DPM;  Location: [facility];  Service: Podiatry;   Laterality: Left;  . AMPUTATION Right 10/18/2018   Procedure: AMPUTATION BELOW KNEE;  Surgeon: Katha Cabal, MD;  Location: [facility];  Service: Vascular;  Laterality: Right;  . APPLICATION OF WOUND VAC Right 12/24/2017   Procedure: APPLICATION OF WOUND VAC;  Surgeon: Sharlotte Alamo, DPM;  Location: [facility];  Service: Podiatry;  Laterality: Right;  . APPLICATION OF WOUND VAC Right 01/01/2018   Procedure: APPLICATION OF WOUND VAC;  Surgeon: Albertine Patricia, DPM;  Location: [facility];  Service: Podiatry;  Laterality: Right;  . BONE EXCISION Bilateral 06/16/2018   Procedure: BONE EXCISION AND SOFT TISSUE;  Surgeon: Albertine Patricia, DPM;  Location: [facility];  Service: Podiatry;  Laterality: Bilateral;  . IRRIGATION AND DEBRIDEMENT FOOT Right 12/20/2017   Procedure: IRRIGATION AND DEBRIDEMENT FOOT;  Surgeon: Sharlotte Alamo, DPM;  Location: [facility];  Service: Podiatry;  Laterality: Right;  . IRRIGATION AND DEBRIDEMENT FOOT Right 12/24/2017   Procedure: IRRIGATION AND DEBRIDEMENT FOOT;  Surgeon: Sharlotte Alamo, DPM;  Location: [facility];  Service: Podiatry;  Laterality: Right;  . IRRIGATION AND DEBRIDEMENT FOOT Right 01/01/2018   Procedure: IRRIGATION AND DEBRIDEMENT FOOT-SKIN,SOFT TISSUE AND BONE;  Surgeon: Albertine Patricia, DPM;  Location: [facility];  Service: Podiatry;  Laterality: Right;  . IRRIGATION AND DEBRIDEMENT FOOT Left 04/06/2018   Procedure: IRRIGATION AND DEBRIDEMENT FOOT;  Surgeon: Sharlotte Alamo, DPM;  Location: [facility];  Service: Podiatry;  Laterality: Left;  . IRRIGATION AND DEBRIDEMENT FOOT Right 09/20/2018   Procedure: IRRIGATION AND DEBRIDEMENT FOOT;  Surgeon: Sharlotte Alamo, DPM;  Location: [facility];  Service: Podiatry;  Laterality: Right;  . LOWER EXTREMITY ANGIOGRAPHY Left 04/06/2018   Procedure: Lower Extremity Angiography;  Surgeon: Algernon Huxley, MD;  [facility];  Service: Cardiovascular;  Laterality: Left;  . LOWER EXTREMITY ANGIOGRAPHY Bilateral 10/13/2018   Procedure:  Lower Extremity Angiography;  Surgeon: Katha Cabal, MD;  Location: [facility];  Service: Cardiovascular;  Laterality: Bilateral;    SOCIAL HISTORY:  Social History   Tobacco Use  . Smoking status: Never Smoker  . Smokeless tobacco: Never Used  Substance Use Topics  . Alcohol use: No    Frequency: Never    FAMILY HISTORY:  Family History  Problem Relation Age of Onset  . Diabetes Mother   . Ovarian cancer Mother   . Healthy Father     DRUG ALLERGIES:  Allergies  Allergen Reactions  . Banana Hives, Nausea And Vomiting and Rash  . Keflex [Cephalexin] Rash    No swelling- also taken penicillin without any issue.  . Sulfa Antibiotics Anaphylaxis  . Grapeseed Extract [Nutritional Supplements] Itching  . Shellfish Allergy Hives    "ALL SEAFOOD"  . Grape Seed Rash    REVIEW OF SYSTEMS:   CONSTITUTIONAL: No fever, fatigue or weakness.  EYES: No blurred or double vision.  EARS, NOSE, AND THROAT: No tinnitus or ear pain.  RESPIRATORY: No cough, shortness of breath, wheezing or hemoptysis.  CARDIOVASCULAR: No chest pain, orthopnea, edema.  GASTROINTESTINAL: No nausea, vomiting, diarrhea or abdominal pain.  GENITOURINARY: No dysuria, hematuria.  ENDOCRINE: Has polyuria, nocturia,  HEMATOLOGY: No anemia, easy bruising or bleeding SKIN: No rash or lesion. MUSCULOSKELETAL: No joint pain or arthritis.   NEUROLOGIC: No tingling, numbness, weakness.  PSYCHIATRY: No anxiety or depression.   MEDICATIONS AT HOME:  Prior to Admission medications   Medication Sig Start Date End Date Taking? Authorizing Provider  ALPRAZolam (XANAX) 0.25 MG tablet Take 1 tablet (0.25 mg total) by mouth 3 (three) times daily as needed for anxiety. 08/18/18  Yes Gladstone Lighter, MD  Cholecalciferol (VITAMIN D3) 125 MCG (5000 UT) CAPS Take 1 capsule (5,000 Units total) by mouth daily with breakfast. Take along with calcium and magnesium. 09/13/18 03/12/19 Yes Milinda Pointer, MD   Cyanocobalamin (VITAMIN B-12) 5000 MCG SUBL Place 1 tablet (5,000 mcg total) under the tongue daily. 09/13/18 12/12/18 Yes Milinda Pointer, MD  diphenoxylate-atropine (LOMOTIL) 2.5-0.025 MG tablet Take 1 tablet by mouth 4 (four) times daily as needed for diarrhea or loose stools.   Yes [provider]  ergocalciferol (VITAMIN D2) 1.25 MG (50000 UT) capsule Take 1 capsule (50,000 Units total) by mouth 2 (two) times a week. X 6 weeks. Patient taking differently: Take 50,000 Units by mouth 2 (two) times a week. X 6 weeks. Take on Monday and friday 09/14/18 10/26/18 Yes Milinda Pointer, MD  ferrous sulfate 325 (65 FE) MG tablet Take 1 tablet (325 mg total) by mouth 2 (two) times daily with a meal. 01/03/18  Yes Sainani, Belia Heman, MD  gabapentin (NEURONTIN) 300 MG capsule Take 1 capsule (300 mg total) by mouth 2 (two) times daily. 09/21/18  Yes Mody, Ulice Bold, MD  insulin aspart (NOVOLOG) 100 UNIT/ML injection Inject 4 Units into the skin 3 (three) times daily with meals. 08/18/18  Yes Gladstone Lighter, MD  insulin glargine (LANTUS) 100 UNIT/ML injection Inject 0.3 mLs (30 Units total) into the skin daily. Patient taking differently: Inject 35 Units into the skin 2 (two) times daily.  08/18/18  Yes Gladstone Lighter, MD  promethazine (PHENERGAN) 25 MG tablet Take 1 tablet (25 mg total) by mouth every 6 (six) hours as needed for nausea or vomiting. 08/18/18  Yes Gladstone Lighter, MD  vitamin C (VITAMIN C) 250 MG tablet Take 1 tablet (250 mg total) by mouth 2 (two) times daily. 06/20/18  Yes Gladstone Lighter, MD  zolpidem (AMBIEN) 10 MG tablet Take 10 mg by mouth at bedtime.    Yes [provider]  amoxicillin-clavulanate (AUGMENTIN) 875-125 MG tablet Take 1 tablet by mouth 2 (two) times daily. Patient not taking: Reported on 10/11/2018 09/21/18   Bettey Costa, MD  calcium carbonate (CALCIUM 600) 600 MG TABS tablet Take 1 tablet (600 mg total) by mouth 2 (two) times daily with a meal for 30  days. 09/13/18 10/13/18  Milinda Pointer, MD  Insulin Syringes, Disposable, U-100 0.3 ML MISC 1 Syringe by Does not apply route 4 (four) times daily -  with meals and at bedtime. 12/27/17   Loletha Grayer, MD  levofloxacin (LEVAQUIN) 750 MG tablet Take 1 tablet (750 mg total) by mouth daily. Patient not taking: Reported on 10/11/2018 09/24/18   Carrie Mew, MD      PHYSICAL EXAMINATION:   VITAL SIGNS: Blood pressure 129/89, pulse (!) 101, temperature 98.6 F (37 C), resp. rate 19, height 5\' 6"  (1.676 m), weight 52.7 kg, SpO2 100 %.  GENERAL:  33 y.o.-year-old patient lying in the bed with no acute distress.  EYES: Pupils equal, round, reactive to light and accommodation. No scleral icterus. Extraocular muscles intact.  HEENT: Head atraumatic, normocephalic. Oropharynx and nasopharynx clear.  NECK:  Supple, no jugular venous distention. No thyroid enlargement, no tenderness.  LUNGS: Normal breath sounds bilaterally, no wheezing, rales,rhonchi or crepitation. No use of accessory muscles of respiration.  CARDIOVASCULAR: S1, S2 normal. No murmurs, rubs, or gallops.  ABDOMEN: Soft, nontender, nondistended. Bowel sounds present. No organomegaly or mass.  EXTREMITIES: No pedal edema, cyanosis,  Has right BKA Bandage noted. NEUROLOGIC: Cranial nerves II through XII are intact. Muscle strength 5/5 in all extremities. Sensation intact. Gait not checked.  PSYCHIATRIC: The patient is alert and oriented x 3.  SKIN: No rash noted.  LABORATORY PANEL:   CBC Recent Labs  Lab 10/18/18 0857  WBC 10.6*  HGB 8.5*  HCT 26.1*  PLT 345  MCV 84.5  MCH 27.5  MCHC 32.6  RDW 13.8  LYMPHSABS 1.5  MONOABS 0.9  EOSABS 0.5  BASOSABS 0.0   ------------------------------------------------------------------------------------------------------------------  Chemistries  Recent Labs  Lab 10/11/18 2222 10/12/18 0354 10/13/18 0349 10/18/18 0857  NA  --  133* 134* 135  K  --  3.6 3.8 3.6  CL  --   107 112* 107  CO2  --  16* 16* 20*  GLUCOSE 445* 236* 168* 351*  BUN  --  55* 61* 24*  CREATININE  --  2.16* 2.30* 2.21*  CALCIUM  --  8.6* 7.9* 8.2*   ------------------------------------------------------------------------------------------------------------------ estimated creatinine clearance is 36.1 mL/min (A) (by C-G formula based on SCr of 2.21 mg/dL (H)). ------------------------------------------------------------------------------------------------------------------ No results for input(s): TSH, T4TOTAL, T3FREE, THYROIDAB in the last 72 hours.  Invalid input(s): FREET3   Coagulation profile Recent Labs  Lab 10/18/18 0857  INR 0.9   ------------------------------------------------------------------------------------------------------------------- No results for input(s): DDIMER in the last 72 hours. -------------------------------------------------------------------------------------------------------------------  Cardiac Enzymes No results for input(s): CKMB, TROPONINI, MYOGLOBIN in the last 168 hours.  Invalid input(s): CK ------------------------------------------------------------------------------------------------------------------ Invalid input(s): POCBNP  ---------------------------------------------------------------------------------------------------------------  Urinalysis    Component Value Date/Time   COLORURINE STRAW (A) 09/19/2018 1815   APPEARANCEUR HAZY (A) 09/19/2018 1815   APPEARANCEUR Clear 10/06/2014 0758   LABSPEC 1.020 09/19/2018 1815   LABSPEC 1.031 10/06/2014 4097  PHURINE 5.0 09/19/2018 1815   GLUCOSEU >=500 (A) 09/19/2018 1815   GLUCOSEU >=500 10/06/2014 0758   HGBUR SMALL (A) 09/19/2018 1815   BILIRUBINUR NEGATIVE 09/19/2018 1815   BILIRUBINUR Negative 10/06/2014 0758   KETONESUR NEGATIVE 09/19/2018 1815   PROTEINUR 100 (A) 09/19/2018 1815   UROBILINOGEN 0.2 03/29/2007 1506   NITRITE NEGATIVE 09/19/2018 1815   LEUKOCYTESUR  NEGATIVE 09/19/2018 1815   LEUKOCYTESUR Negative 10/06/2014 0758     RADIOLOGY: No results found.  EKG: Orders placed or performed during the hospital encounter of 10/18/18  . EKG test  . EKG test    IMPRESSION AND PLAN: 33 year old male patient with a known history of chronic kidney disease, type 2 diabetes mellitus, GERD, hypertension, inflammatory bowel syndrome, osteomyelitis currently under vascular surgery service.Patient had right below the knee amputation today.   -Uncontrolled diabetes mellitus Start patient on Lantus insulin 28 units subcu twice daily NovoLog sliding scale coverage Add NovoLog mealtime insulin Check blood sugars Diabetic diet  -Right leg gangrene Status post right below-knee amputation by vascular surgery Vascular surgery follow-up Daily wound dressings  -Right BKA stump pain Continue oral oxycodone and PRN IV Dilaudid  -DVT prophylaxis subcu Lovenox daily  All the records are reviewed and case discussed with ED provider. Management plans discussed with the patient, family and they are in agreement.  CODE STATUS: Full code    Code Status Orders  (From admission, onward)         Start     Ordered   10/18/18 1237  Full code  Continuous     10/18/18 1242        Code Status History    Date Active Date Inactive Code Status Order ID Comments User Context   10/11/2018 1157 10/13/2018 1654 Full Code [id number]  Hillary Bow, MD Inpatient   09/19/2018 2049 09/21/2018 1851 Full Code [id number]  Saundra Shelling, MD Inpatient   08/16/2018 1203 08/18/2018 2142 Full Code [id number]  Hillary Bow, MD ED   07/16/2018 1739 07/18/2018 0104 Full Code [id number]  Dustin Flock, MD Inpatient   07/10/2018 0111 07/11/2018 1955 Full Code [id number]  Salary, Avel Peace, MD Inpatient   06/14/2018 1744 06/20/2018 2123 Full Code [id number]  Loletha Grayer, MD ED   05/28/2018 0437 05/28/2018 1926 Full Code [id number]  Arta Silence, MD ED   04/27/2018 1726 05/02/2018  1713 Full Code [id number]  Henreitta Leber, MD Inpatient   04/05/2018 1724 04/11/2018 2142 Full Code [id number]  Dustin Flock, MD Inpatient   02/06/2018 2022 02/07/2018 1512 Full Code [id number]  Demetrios Loll, MD Inpatient   01/10/2018 2259 01/11/2018 1947 Full Code [id number]  Amelia Jo, MD Inpatient   12/30/2017 0019 01/03/2018 1457 Full Code [id number]  Harrie Foreman, MD Inpatient   12/20/2017 1541 12/27/2017 2147 Full Code [id number]  Sharlotte Alamo, [facility] Inpatient   11/10/2017 0049 11/11/2017 1632 Full Code [id number]  Amelia Jo, MD Inpatient   07/13/2017 2112 07/15/2017 1730 Full Code [id number]  Jules Husbands, MD ED       TOTAL TIME TAKING CARE OF THIS PATIENT: 53 minutes.    Saundra Shelling M.D on 10/18/2018 at 4:37 PM  Between 7am to 6pm - Pager - [phone]  After 6pm go to [email or web address] - password [email or web address] [facility]  [phone]  CC: Primary care physician; Valera Castle, MD

## 2018-10-18 NOTE — Anesthesia Post-op Follow-up Note (Signed)
Anesthesia QCDR form completed.        

## 2018-10-18 NOTE — Anesthesia Postprocedure Evaluation (Signed)
Anesthesia Post Note  Patient: Shawn [name]  Procedure(s) Performed: AMPUTATION BELOW KNEE (Right )  Patient location during evaluation: PACU Anesthesia Type: General Level of consciousness: awake and alert Pain management: pain level controlled Vital Signs Assessment: post-procedure vital signs reviewed and stable Respiratory status: spontaneous breathing, nonlabored ventilation, respiratory function stable and patient connected to nasal cannula oxygen Cardiovascular status: blood pressure returned to baseline and stable Postop Assessment: no apparent nausea or vomiting Anesthetic complications: no     Last Vitals:  Vitals:   10/18/18 1326 10/18/18 1331  BP: 123/80   Pulse: 86 88  Resp: 11 12  Temp:    SpO2: 100% 100%    Last Pain:  Vitals:   10/18/18 1341  TempSrc:   PainSc: 7                  Precious Haws Kaleisha Bhargava

## 2018-10-19 LAB — BASIC METABOLIC PANEL
Anion gap: 8 (ref 5–15)
BUN: 30 mg/dL — ABNORMAL HIGH (ref 6–20)
CO2: 21 mmol/L — ABNORMAL LOW (ref 22–32)
Calcium: 7.8 mg/dL — ABNORMAL LOW (ref 8.9–10.3)
Chloride: 106 mmol/L (ref 98–111)
Creatinine, Ser: 2.36 mg/dL — ABNORMAL HIGH (ref 0.61–1.24)
GFR calc Af Amer: 41 mL/min — ABNORMAL LOW (ref >60)
GFR calc non Af Amer: 35 mL/min — ABNORMAL LOW (ref >60)
Glucose, Bld: 231 mg/dL — ABNORMAL HIGH (ref 70–99)
Potassium: 3.6 mmol/L (ref 3.5–5.1)
Sodium: 135 mmol/L (ref 135–145)

## 2018-10-19 LAB — GLUCOSE, CAPILLARY
Glucose-Capillary: 202 mg/dL — ABNORMAL HIGH (ref 70–99)
Glucose-Capillary: 143 mg/dL — ABNORMAL HIGH (ref 70–99)
Glucose-Capillary: 130 mg/dL — ABNORMAL HIGH (ref 70–99)
Glucose-Capillary: 184 mg/dL — ABNORMAL HIGH (ref 70–99)

## 2018-10-19 LAB — CBC
HCT: 20.8 % — ABNORMAL LOW (ref 39.0–52.0)
Hemoglobin: 6.5 g/dL — ABNORMAL LOW (ref 13.0–17.0)
MCH: 27.2 pg (ref 26.0–34.0)
MCHC: 31.3 g/dL (ref 30.0–36.0)
MCV: 87 fL (ref 80.0–100.0)
Platelets: 300 10*3/uL (ref 150–400)
RBC: 2.39 MIL/uL — ABNORMAL LOW (ref 4.22–5.81)
RDW: 13.7 % (ref 11.5–15.5)
WBC: 13.5 10*3/uL — ABNORMAL HIGH (ref 4.0–10.5)
nRBC: 0 % (ref 0.0–0.2)

## 2018-10-19 LAB — PREPARE RBC (CROSSMATCH)

## 2018-10-19 LAB — HEMOGLOBIN AND HEMATOCRIT, BLOOD
HCT: 24.9 % — ABNORMAL LOW (ref 39.0–52.0)
Hemoglobin: 8.1 g/dL — ABNORMAL LOW (ref 13.0–17.0)

## 2018-10-19 MED ORDER — ACETAMINOPHEN 325 MG PO TABS
650.0000 mg | ORAL_TABLET | Freq: Once | ORAL | Status: AC
  Administered 2018-10-19: 650 mg via ORAL
  Filled 2018-10-19: qty 2

## 2018-10-19 MED ORDER — OXYCODONE HCL 5 MG/5ML PO SOLN
5.0000 mg | Freq: Once | ORAL | Status: AC | PRN
  Administered 2018-10-19: 5 mg via ORAL
  Filled 2018-10-19: qty 5

## 2018-10-19 MED ORDER — OXYCODONE HCL 5 MG PO TABS: 5.0000 mg | ORAL_TABLET | Freq: Once | ORAL | Status: AC | PRN

## 2018-10-19 MED ORDER — SODIUM CHLORIDE 0.9% IV SOLUTION
Freq: Once | INTRAVENOUS | Status: AC
  Administered 2018-10-19: 09:00:00 via INTRAVENOUS

## 2018-10-19 MED ORDER — FUROSEMIDE 10 MG/ML IJ SOLN
40.0000 mg | Freq: Once | INTRAMUSCULAR | Status: AC
  Administered 2018-10-19: 40 mg via INTRAVENOUS
  Filled 2018-10-19: qty 4

## 2018-10-19 NOTE — Progress Notes (Signed)
Patient ID: Shawn [name], male   DOB: May 26, 1986, 33 y.o.   MRN: [id number]  [facility] PROGRESS NOTE  DRAPER GALLON GNO:[id number] DOB: 1985-11-26 DOA: 10/18/2018 PCP: Valera Castle, MD  HPI/Subjective: Patient feeling quite a bit of pain in his right lower extremity.  Sometimes up to 7 out of 10 in intensity.  Patient was started on PCA pump last night.  Objective: Vitals:   10/19/18 0426 10/19/18 0722  BP:    Pulse:    Resp: 17 13  Temp:    SpO2: 94% 100%    Filed Weights   10/18/18 0916 10/19/18 0500  Weight: 52.7 kg 52.9 kg    ROS: Review of Systems  Constitutional: Negative for chills and fever.  Eyes: Negative for blurred vision.  Respiratory: Negative for cough and shortness of breath.   Cardiovascular: Negative for chest pain.  Gastrointestinal: Positive for nausea and vomiting. Negative for abdominal pain, constipation and diarrhea.  Genitourinary: Negative for dysuria.  Musculoskeletal: Positive for joint pain.  Neurological: Negative for dizziness and headaches.   Exam: Physical Exam  Constitutional: He is oriented to person, place, and time.  HENT:  Nose: No mucosal edema.  Mouth/Throat: No oropharyngeal exudate or posterior oropharyngeal edema.  Eyes: Pupils are equal, round, and reactive to light. Conjunctivae, EOM and lids are normal.  Neck: No JVD present. Carotid bruit is not present. No edema present. No thyroid mass and no thyromegaly present.  Cardiovascular: S1 normal and S2 normal. Tachycardia present. Exam reveals no gallop.  No murmur heard. Pulses:      Dorsalis pedis pulses are 2+ on the left side.  Respiratory: No respiratory distress. He has no wheezes. He has no rhonchi. He has no rales.  GI: Soft. Bowel sounds are normal. There is no abdominal tenderness.  Musculoskeletal:     Right shoulder: He exhibits no swelling.  Lymphadenopathy:    He has no cervical adenopathy.  Neurological: He is alert and oriented to  person, place, and time. No cranial nerve deficit.  Skin: Skin is warm. Nails show no clubbing.  Right lower extremity amputation site covered in pressure dressing.  Left foot prior amputation site healed well.  Psychiatric: He has a normal mood and affect.      Data Reviewed: Basic Metabolic Panel: Recent Labs  Lab 10/13/18 0349 10/18/18 0857 10/19/18 0345  NA 134* 135 135  K 3.8 3.6 3.6  CL 112* 107 106  CO2 16* 20* 21*  GLUCOSE 168* 351* 231*  BUN 61* 24* 30*  CREATININE 2.30* 2.21* 2.36*  CALCIUM 7.9* 8.2* 7.8*   CBC: Recent Labs  Lab 10/18/18 0857 10/19/18 0345  WBC 10.6* 13.5*  NEUTROABS 7.8*  --   HGB 8.5* 6.5*  HCT 26.1* 20.8*  MCV 84.5 87.0  PLT 345 300    CBG: Recent Labs  Lab 10/18/18 1243 10/18/18 1342 10/18/18 1558 10/18/18 2112 10/19/18 0755  GLUCAP 212* 219* 315* 323* 202*    Scheduled Meds: . sodium chloride   Intravenous Once  . acetaminophen  650 mg Oral Once  . calcium carbonate  500 mg Oral BID WC  . docusate sodium  100 mg Oral BID  . enoxaparin (LOVENOX) injection  40 mg Subcutaneous Q24H  . feeding supplement (GLUCERNA SHAKE)  237 mL Oral TID BM  . ferrous sulfate  325 mg Oral BID WC  . furosemide  40 mg Intravenous Once  . gabapentin  300 mg Oral BID  . HYDROmorphone   Intravenous  Q4H  . insulin aspart  0-5 Units Subcutaneous QHS  . insulin aspart  0-9 Units Subcutaneous TID WC  . insulin aspart  4 Units Subcutaneous TID WC  . insulin glargine  28 Units Subcutaneous BID  . multivitamin with minerals  1 tablet Oral Daily  . vitamin B-12  5,000 mcg Oral Daily  . ascorbic acid  250 mg Oral BID  . zolpidem  10 mg Oral QHS   Continuous Infusions: . clindamycin (CLEOCIN) IV 300 mg (10/19/18 0529)    Assessment/Plan:  1. Postoperative anemia.  Hemoglobin dropped down to 6.5.  DC IV fluids.  Transfuse 1 unit of packed red blood cells.  Benefits and risks of transfusion explained to patient.  Patient has had transfusions in the  past. 2. 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  Patient had right leg gangrene which was the reason for amputation.  On PCA pump and oral pain medications. 3. Uncontrolled type 2 diabetes mellitus on Lantus insulin 28 units twice a day and short acting insulin. 4. Chronic kidney disease stage III.  Monitor closely. 5. Diabetic neuropathy on gabapentin  Code Status:     Code Status Orders  (From admission, onward)         Start     Ordered   10/18/18 1237  Full code  Continuous     10/18/18 1242        Code Status History    Date Active Date Inactive Code Status Order ID Comments User Context   10/11/2018 1157 10/13/2018 1654 Full Code [id number]  Hillary Bow, MD Inpatient   09/19/2018 2049 09/21/2018 1851 Full Code [id number]  Saundra Shelling, MD Inpatient   08/16/2018 1203 08/18/2018 2142 Full Code [id number]  Hillary Bow, MD ED   07/16/2018 1739 07/18/2018 0104 Full Code [id number]  Dustin Flock, MD Inpatient   07/10/2018 0111 07/11/2018 1955 Full Code [id number]  Salary, Avel Peace, MD Inpatient   06/14/2018 1744 06/20/2018 2123 Full Code [id number]  Loletha Grayer, MD ED   05/28/2018 0437 05/28/2018 1926 Full Code [id number]  Arta Silence, MD ED   04/27/2018 1726 05/02/2018 1713 Full Code [id number]  Henreitta Leber, MD Inpatient   04/05/2018 1724 04/11/2018 2142 Full Code [id number]  Dustin Flock, MD Inpatient   02/06/2018 2022 02/07/2018 1512 Full Code [id number]  Demetrios Loll, MD Inpatient   01/10/2018 2259 01/11/2018 1947 Full Code [id number]  Amelia Jo, MD Inpatient   12/30/2017 0019 01/03/2018 1457 Full Code [id number]  Harrie Foreman, MD Inpatient   12/20/2017 1541 12/27/2017 2147 Full Code [id number]  Sharlotte Alamo, [facility] Inpatient   11/10/2017 0049 11/11/2017 1632 Full Code [id number]  Amelia Jo, MD Inpatient   07/13/2017 2112 07/15/2017 1730 Full Code [id number]  Jules Husbands, MD ED      Disposition Plan: To be determined  Consultants:  Vascular  surgery  Procedures:  Right leg below-knee amputation  Antibiotics:  Clindamycin for a few doses postoperatively  Time spent: 28 minutes  [place]

## 2018-10-19 NOTE — Progress Notes (Signed)
[facility] Daily Progress Note   Subjective: 1 Day Post-Op:  Right below-the-knee amputation  Patient complaining of right stump pain. No issues overnight.   Objective: Vitals:   10/19/18 0326 10/19/18 0426 10/19/18 0500 10/19/18 0722  BP: 122/89     Pulse: (!) 105     Resp: 17 17  13   Temp:      TempSrc:      SpO2: 100% 94%  100%  Weight:   52.9 kg   Height:        Intake/Output Summary (Last 24 hours) at 10/19/2018 0944 Last data filed at 10/19/2018 0542 Gross per 24 hour  Intake 743.59 ml  Output 800 ml  Net -56.41 ml   Physical Exam: A&Ox3, NAD CV: RRR Pulmonary: CTA Bilaterally Abdomen: Soft, Nontender, Nondistended Vascular:  Right Lower Extremity: Thigh soft. Knee flexible at joint. Dressing clean, dry and intact   Laboratory: CBC    Component Value Date/Time   WBC 13.5 (H) 10/19/2018 0345   HGB 6.5 (L) 10/19/2018 0345   HGB 16.4 10/06/2014 0539   HCT 20.8 (L) 10/19/2018 0345   HCT 47.4 10/06/2014 0539   PLT 300 10/19/2018 0345   PLT 250 10/06/2014 0539   BMET    Component Value Date/Time   NA 135 10/19/2018 0345   NA 128 (L) 08/14/2018 1055   NA 132 (L) 10/06/2014 0539   K 3.6 10/19/2018 0345   K 3.4 (L) 10/06/2014 0539   CL 106 10/19/2018 0345   CL 91 (L) 10/06/2014 0539   CO2 21 (L) 10/19/2018 0345   CO2 26 10/06/2014 0539   GLUCOSE 231 (H) 10/19/2018 0345   GLUCOSE 634 (HH) 10/06/2014 0539   BUN 30 (H) 10/19/2018 0345   BUN 25 (H) 08/14/2018 1055   BUN 16 10/06/2014 0539   CREATININE 2.36 (H) 10/19/2018 0345   CREATININE 1.04 10/06/2014 0539   CALCIUM 7.8 (L) 10/19/2018 0345   CALCIUM 10.0 10/06/2014 0539   GFRNONAA 35 (L) 10/19/2018 0345   GFRNONAA >60 10/06/2014 0539   GFRAA 41 (L) 10/19/2018 0345   GFRAA >60 10/06/2014 0539   Assessment/Planning: The patient is a 33 year old male s/p RIGHT BKA - POD #1 1) Hbg: 6.5 - asymptomatic. Medicine transfusing one unit PRBC. CBC in AM. 2) Pain control - with PCA  will transition to PO tomorrow for dispo 3) Consulted diabetes due to patients being poorly controlled as an outpatient 4) PT / OT. Patient to start to ambulate. 5) Social work for placement to rehab  Discussed with Dr. Eber Hong Hadiya Spoerl PA-C 10/19/2018 9:44 AM

## 2018-10-19 NOTE — Progress Notes (Signed)
PT Cancellation Note  Patient Details Name: DESTON BILYEU MRN: [id number] DOB: [birth year]   Cancelled Treatment:    Reason Eval/Treat Not Completed: Medical issues which prohibited therapy.  PT consult received.  Chart reviewed.  Nurse reports pt currently receiving blood transfusion (Hgb noted to be 6.5 this morning).  Will re-attempt PT evaluation at a later date/time.  Leitha Bleak, PT 10/19/18, 1:22 PM [phone]

## 2018-10-19 NOTE — Progress Notes (Signed)
PT Cancellation Note  Patient Details Name: GIBSON LAD MRN: [id number] DOB: 1985/12/24   Cancelled Treatment:    Reason Eval/Treat Not Completed: Patient declined, no reason specified Attempted to eval pt this afternoon post transfusion.  Pt states he is eager to try some mobility tomorrow but that he's hurting, felling nauseated, generally feeling very poorly and does not wish to do anything today.  Kreg Shropshire, DPT 10/19/2018, 5:01 PM

## 2018-10-19 NOTE — Progress Notes (Signed)
OT Cancellation Note  Patient Details Name: Shawn [name] MRN: [id number] DOB: 1985/09/05   Cancelled Treatment:    Reason Eval/Treat Not Completed: Other (comment). Consult received, chart reviewed. Pt noted with Hgb 6.5, receiving a blood transfusion. Will continue to follow acutely and re-attempt at later date/time as pt is medically appropriate.   Jeni Salles, MPH, MS, OTR/L ascom [phone] 10/19/18, 1:25 PM

## 2018-10-20 LAB — BASIC METABOLIC PANEL
Anion gap: 8 (ref 5–15)
BUN: 39 mg/dL — ABNORMAL HIGH (ref 6–20)
CO2: 21 mmol/L — ABNORMAL LOW (ref 22–32)
Calcium: 8.2 mg/dL — ABNORMAL LOW (ref 8.9–10.3)
Chloride: 106 mmol/L (ref 98–111)
Creatinine, Ser: 2.03 mg/dL — ABNORMAL HIGH (ref 0.61–1.24)
GFR calc Af Amer: 49 mL/min — ABNORMAL LOW (ref >60)
GFR calc non Af Amer: 42 mL/min — ABNORMAL LOW (ref >60)
Glucose, Bld: 81 mg/dL (ref 70–99)
Potassium: 3.6 mmol/L (ref 3.5–5.1)
Sodium: 135 mmol/L (ref 135–145)

## 2018-10-20 LAB — SURGICAL PATHOLOGY

## 2018-10-20 LAB — CBC
HCT: 26.3 % — ABNORMAL LOW (ref 39.0–52.0)
Hemoglobin: 8.3 g/dL — ABNORMAL LOW (ref 13.0–17.0)
MCH: 27.2 pg (ref 26.0–34.0)
MCHC: 31.6 g/dL (ref 30.0–36.0)
MCV: 86.2 fL (ref 80.0–100.0)
Platelets: 255 10*3/uL (ref 150–400)
RBC: 3.05 MIL/uL — ABNORMAL LOW (ref 4.22–5.81)
RDW: 14.6 % (ref 11.5–15.5)
WBC: 14.3 10*3/uL — ABNORMAL HIGH (ref 4.0–10.5)
nRBC: 0 % (ref 0.0–0.2)

## 2018-10-20 LAB — MAGNESIUM: Magnesium: 1.7 mg/dL (ref 1.7–2.4)

## 2018-10-20 LAB — GLUCOSE, CAPILLARY
Glucose-Capillary: 155 mg/dL — ABNORMAL HIGH (ref 70–99)
Glucose-Capillary: 65 mg/dL — ABNORMAL LOW (ref 70–99)
Glucose-Capillary: 110 mg/dL — ABNORMAL HIGH (ref 70–99)
Glucose-Capillary: 154 mg/dL — ABNORMAL HIGH (ref 70–99)
Glucose-Capillary: 149 mg/dL — ABNORMAL HIGH (ref 70–99)

## 2018-10-20 MED ORDER — OXYCODONE-ACETAMINOPHEN 5-325 MG PO TABS
1.0000 | ORAL_TABLET | ORAL | Status: DC | PRN
  Administered 2018-10-20 – 2018-10-24 (×18): 1 via ORAL
  Filled 2018-10-20 (×20): qty 1

## 2018-10-20 MED ORDER — INSULIN GLARGINE 100 UNIT/ML ~~LOC~~ SOLN
25.0000 [IU] | Freq: Two times a day (BID) | SUBCUTANEOUS | Status: DC
  Administered 2018-10-20 – 2018-10-24 (×8): 25 [IU] via SUBCUTANEOUS
  Filled 2018-10-20 (×15): qty 0.25

## 2018-10-20 MED ORDER — POLYETHYLENE GLYCOL 3350 17 G PO PACK
17.0000 g | PACK | Freq: Every day | ORAL | Status: DC
  Administered 2018-10-25 – 2018-10-28 (×4): 17 g via ORAL
  Filled 2018-10-20 (×6): qty 1

## 2018-10-20 MED ORDER — OXYCODONE HCL 5 MG PO TABS
5.0000 mg | ORAL_TABLET | ORAL | Status: DC | PRN
  Administered 2018-10-20 – 2018-10-26 (×19): 5 mg via ORAL
  Filled 2018-10-20 (×20): qty 1

## 2018-10-20 NOTE — NC FL2 (Signed)
[place] LEVEL OF CARE SCREENING TOOL     IDENTIFICATION  Patient Name: Shawn [name] Birthdate: May 08, 1986 Sex: male Admission Date (Current Location): 10/18/2018  [facility] and [place] Number:  Engineering geologist and Address:  [facility], [address] [place], [place] [address]      Provider Number: [id number]  Attending Physician Name and Address:  Loletha Grayer, MD  Relative Name and Phone Number:       Current Level of Care: Hospital Recommended Level of Care: [place] Prior Approval Number:    Date Approved/Denied:   PASRR Number:    Discharge Plan: SNF    Current Diagnoses: Patient Active Problem List   Diagnosis Date Noted  . 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 Hyperglycemia 10/11/2018  . Pressure injury of skin 09/20/2018  . Uncontrolled diabetes mellitus ([place]) 09/19/2018  . Neurogenic pain 09/13/2018  . Marijuana use 09/13/2018  . Long term prescription benzodiazepine use 09/13/2018  . Insulin-dependent diabetes mellitus with renal complications ([place]) 34/19/6222  . Vitamin D deficiency 08/21/2018  . Hypocalcemia 08/21/2018  . CKD stage G3a/A1, GFR 45-59 and albumin creatinine ratio <30 mg/g (HCC) 08/21/2018  . History of acute renal failure 08/21/2018  . [place] (hyperglycemic hyperosmolar nonketotic coma) ([place]) 08/16/2018  . Chronic foot pain (Primary Area of Pain) (Bilateral) (L>R) 08/14/2018  . Chronic lower extremity pain (Secondary Area of Pain) (Bilateral) (L>R) 08/14/2018  . Chronic generalized abdominal pain [facility] Area of Pain) 08/14/2018  . Chronic pain syndrome 08/14/2018  . Long term current use of opiate analgesic 08/14/2018  . Pharmacologic therapy 08/14/2018  . Disorder of skeletal system 08/14/2018  . Problems influencing health status 08/14/2018  . Nausea & vomiting 07/16/2018  . IV infusion line dysfunction ([place]) 07/09/2018  .  Hyperglycemic hyperosmolar nonketotic coma ([place]) 05/28/2018  . Acute renal failure (ARF) ([place]) 04/27/2018  . Left foot infection 04/05/2018  . Foot infection 02/06/2018  . Local infection of the skin and subcutaneous tissue, unspecified 02/06/2018  . Hypertension associated with diabetes ([place]) 01/16/2018  . Osteomyelitis ([place]) 01/10/2018  . Cellulitis 12/29/2017  . Sepsis ([place]) 12/18/2017  . Moderate recurrent major depression ([place]) 11/10/2017  . Type 2 diabetes mellitus with hyperosmolar nonketotic hyperglycemia ([place]) 11/09/2017  . History of migraine 07/15/2017  . IBS (irritable bowel syndrome) 07/15/2017  . Protein-calorie malnutrition, severe 07/14/2017  . Abdominal pain 07/13/2017  . Diarrhea 06/01/2013  . 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 GERD (gastroesophageal reflux disease) 12/30/2009  . Type 1 diabetes mellitus with diabetic polyneuropathy ([place]) 11/25/2009  . MDD (major depressive disorder) 11/25/2009  . Hypercholesterolemia 03/07/2008    Orientation RESPIRATION BLADDER Height & Weight     Self, Time, Situation, Place  Normal Continent Weight: 116 lb 10 oz (52.9 kg) Height:  5\' 6"  (167.6 cm)  BEHAVIORAL SYMPTOMS/MOOD NEUROLOGICAL BOWEL NUTRITION STATUS  (none) (none) Continent Diet(regular)  AMBULATORY STATUS COMMUNICATION OF NEEDS Skin   Limited Assist Verbally Surgical wounds(new amputee)                       Personal Care Assistance Level of Assistance  Bathing, Dressing Bathing Assistance: Limited assistance   Dressing Assistance: Limited assistance     Functional Limitations Info  (none)          SPECIAL CARE FACTORS FREQUENCY  PT (By licensed PT)  Contractures Contractures Info: Not present    Additional Factors Info                  Current Medications (10/20/2018):  This is the current hospital active medication list Current Facility-Administered Medications  Medication Dose Route Frequency Provider Last  Rate Last Dose  . acetaminophen (TYLENOL) tablet 650 mg  650 mg Oral Q6H PRN Schnier, Dolores Lory, MD       Or  . acetaminophen (TYLENOL) suppository 650 mg  650 mg Rectal Q6H PRN Schnier, Dolores Lory, MD      . ALPRAZolam Duanne Moron) tablet 0.25 mg  0.25 mg Oral TID PRN Delana Meyer Dolores Lory, MD   0.25 mg at 10/20/18 0757  . calcium carbonate (TUMS - dosed in mg elemental calcium) chewable tablet 500 mg  500 mg Oral BID WC Schnier, Dolores Lory, MD   500 mg at 10/20/18 0800  . diphenhydrAMINE (BENADRYL) injection 12.5 mg  12.5 mg Intravenous Q6H PRN Lance Coon, MD       Or  . diphenhydrAMINE (BENADRYL) 12.5 MG/5ML elixir 12.5 mg  12.5 mg Oral Q6H PRN Lance Coon, MD      . diphenoxylate-atropine (LOMOTIL) 2.5-0.025 MG per tablet 1 tablet  1 tablet Oral QID PRN Schnier, Dolores Lory, MD      . docusate sodium (COLACE) capsule 100 mg  100 mg Oral BID Schnier, Dolores Lory, MD      . enoxaparin (LOVENOX) injection 40 mg  40 mg Subcutaneous Q24H Schnier, Dolores Lory, MD   40 mg at 10/19/18 2100  . feeding supplement (GLUCERNA SHAKE) (GLUCERNA SHAKE) liquid 237 mL  237 mL Oral TID BM Schnier, Dolores Lory, MD   237 mL at 10/19/18 1237  . ferrous sulfate tablet 325 mg  325 mg Oral BID WC Schnier, Dolores Lory, MD   325 mg at 10/20/18 0758  . gabapentin (NEURONTIN) capsule 300 mg  300 mg Oral BID Schnier, Dolores Lory, MD   300 mg at 10/20/18 0800  . HYDROmorphone (DILAUDID) 1 mg/mL PCA injection   Intravenous Q4H Lance Coon, MD   25 mg at 10/20/18 1153  . insulin aspart (novoLOG) injection 0-5 Units  0-5 Units Subcutaneous QHS Saundra Shelling, MD   4 Units at 10/18/18 2121  . insulin aspart (novoLOG) injection 0-9 Units  0-9 Units Subcutaneous TID WC Saundra Shelling, MD   1 Units at 10/19/18 1646  . insulin aspart (novoLOG) injection 4 Units  4 Units Subcutaneous TID WC Saundra Shelling, MD   4 Units at 10/19/18 1646  . insulin glargine (LANTUS) injection 25 Units  25 Units Subcutaneous BID Wieting, Richard, MD      . magnesium  hydroxide (MILK OF MAGNESIA) suspension 30 mL  30 mL Oral Daily PRN Schnier, Dolores Lory, MD      . multivitamin with minerals tablet 1 tablet  1 tablet Oral Daily Schnier, Dolores Lory, MD   1 tablet at 10/20/18 0758  . naloxone [facility]) injection 0.4 mg  0.4 mg Intravenous PRN Lance Coon, MD       And  . sodium chloride flush (NS) 0.9 % injection 9 mL  9 mL Intravenous PRN Lance Coon, MD      . ondansetron [facility]) injection 4 mg  4 mg Intravenous Q6H PRN Lance Coon, MD   4 mg at 10/20/18 0432  . oxyCODONE-acetaminophen (PERCOCET/ROXICET) 5-325 MG per tablet 1 tablet  1 tablet Oral Q4H PRN Loletha Grayer, MD   1 tablet at 10/20/18 1506  And  . oxyCODONE (Oxy IR/ROXICODONE) immediate release tablet 5 mg  5 mg Oral Q4H PRN Loletha Grayer, MD   5 mg at 10/20/18 1506  . polyethylene glycol (MIRALAX / GLYCOLAX) packet 17 g  17 g Oral Daily Wieting, Richard, MD      . promethazine (PHENERGAN) injection 12.5 mg  12.5 mg Intravenous Q6H PRN Saundra Shelling, MD   12.5 mg at 10/20/18 0655  . sodium phosphate (FLEET) 7-19 GM/118ML enema 1 enema  1 enema Rectal Once PRN Schnier, Dolores Lory, MD      . sorbitol 70 % solution 30 mL  30 mL Oral Daily PRN Schnier, Dolores Lory, MD      . vitamin B-12 (CYANOCOBALAMIN) tablet 5,000 mcg  5,000 mcg Oral Daily Schnier, Dolores Lory, MD   5,000 mcg at 10/20/18 0758  . vitamin C (ASCORBIC ACID) tablet 250 mg  250 mg Oral BID Schnier, Dolores Lory, MD   250 mg at 10/20/18 9211  . zolpidem (AMBIEN) tablet 10 mg  10 mg Oral QHS Schnier, Dolores Lory, MD   10 mg at 10/20/18 0055     Discharge Medications: Please see discharge summary for a list of discharge medications.  Relevant Imaging Results:  Relevant Lab Results:   Additional Information ss: [id number]  Shela Leff, LCSW

## 2018-10-20 NOTE — Progress Notes (Signed)
Patient found on floor in room. Patient stated he attempted to get to bed from chair (placed in by OT) by himself without calling for help. Stated he's aware he's supposed to call but hates having alarms on. Observed that chair alarm not on and chair brakes not deployed. Returned patient to bed with help of [place]. Patient denied any pain or injury. Did not observe any irregularities at all. Patient's vital signs WNL.

## 2018-10-20 NOTE — Progress Notes (Signed)
[facility] Daily Progress Note  Subjective: 2 Days Post-Op: Right below-the-knee amputation  Patient with continued pain to the right stump. No issues overnight.   Objective: Vitals:   10/20/18 0052 10/20/18 0432 10/20/18 0436 10/20/18 0804  BP:   (!) 134/94   Pulse:   (!) 110   Resp: 15 11 16 16   Temp:   98.6 F (37 C)   TempSrc:   Oral   SpO2: 100% 100% 98% 100%  Weight:      Height:        Intake/Output Summary (Last 24 hours) at 10/20/2018 0950 Last data filed at 10/20/2018 0500 Gross per 24 hour  Intake 1273 ml  Output 2400 ml  Net -1127 ml   Physical Exam: A&Ox3, NAD CV: RRR Pulmonary: CTA Bilaterally Abdomen: Soft, Nontender, Nondistended Vascular:   Right Lower Extremity: Thigh soft, Knee flexible. OR dressing removed. Incision, clean, dry and intact. Dermabond intact. Stump healthy.    Laboratory: CBC    Component Value Date/Time   WBC 14.3 (H) 10/20/2018 0559   HGB 8.3 (L) 10/20/2018 0559   HGB 16.4 10/06/2014 0539   HCT 26.3 (L) 10/20/2018 0559   HCT 47.4 10/06/2014 0539   PLT 255 10/20/2018 0559   PLT 250 10/06/2014 0539   BMET    Component Value Date/Time   NA 135 10/20/2018 0559   NA 128 (L) 08/14/2018 1055   NA 132 (L) 10/06/2014 0539   K 3.6 10/20/2018 0559   K 3.4 (L) 10/06/2014 0539   CL 106 10/20/2018 0559   CL 91 (L) 10/06/2014 0539   CO2 21 (L) 10/20/2018 0559   CO2 26 10/06/2014 0539   GLUCOSE 81 10/20/2018 0559   GLUCOSE 634 (HH) 10/06/2014 0539   BUN 39 (H) 10/20/2018 0559   BUN 25 (H) 08/14/2018 1055   BUN 16 10/06/2014 0539   CREATININE 2.03 (H) 10/20/2018 0559   CREATININE 1.04 10/06/2014 0539   CALCIUM 8.2 (L) 10/20/2018 0559   CALCIUM 10.0 10/06/2014 0539   GFRNONAA 42 (L) 10/20/2018 0559   GFRNONAA >60 10/06/2014 0539   GFRAA 49 (L) 10/20/2018 0559   GFRAA >60 10/06/2014 0539   Assessment/Planning: The patient is a 33 year old male s/p RIGHT BKA - POD #2 1) Hbg stable 2) Pain control -  would like to stop PCA as soon as possible. Patient with past drug seeking behavior. Clearly obsessing about what pain pills he receives and when he can take them. His post-op pain will be hard to control due to his prior usage of narcotics. Dr. Delana Meyer and I have both told him he can not have oxycodone and the PCA at the same time.  3) Consulted diabetes due to patients being poorly controlled as an outpatient 4) PT / OT. Patient to start to ambulate. PT recommends SNF. 5) Social work for placement to rehab  Discussed with Dr. Eber Hong Stegmayer PA-C 10/20/2018 9:50 AM

## 2018-10-20 NOTE — Evaluation (Signed)
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     Clinical Impression   Pt seen for OT evaluation this date, POD#2 from above surgery. Pt was independent in all ADL prior to surgery. Pt is eager to return to PLOF with less pain and improved safety and independence. Pt currently requires min-mod assist for LB ADL and fatigues quickly with pain to residual limb. Pt instructed in desensitization strategies for management of residual limb, falls prevention strategies, and positioning of RLE to promote knee extension. Pt would benefit from skilled OT services including additional instruction in dressing techniques and functional transfer techniques with or without assistive devices for dressing and bathing skills prior to discharge and ultimately to maximize safety, independence, and minimize falls risk and caregiver burden. Recommending STR to support optimal safe eventual return home.     Follow Up Recommendations  SNF    Equipment Recommendations  Other (comment)(TBD)    Recommendations for Other Services       Precautions / Restrictions Precautions Precautions: Fall Restrictions Weight Bearing Restrictions: Yes RLE Weight Bearing: Non weight bearing Other Position/Activity Restrictions: s/p R BKA      Mobility Bed Mobility             General bed mobility comments: deferred, up in recliner at start and end of session  Transfers         General transfer comment: pt declined 2/2 pain and recent PT session    Balance Overall balance assessment: Needs assistance Sitting-balance support: No upper extremity supported Sitting balance-Leahy Scale: Good Sitting balance - Comments: Pt able to maintain sitting balance relatively well at EOB                              ADL either performed or assessed with clinical judgement   ADL Overall ADL's : Needs assistance/impaired Eating/Feeding: Sitting;Independent   Grooming: Sitting;Independent   Upper Body Bathing: Sitting;Supervision/ safety;Set up   Lower Body Bathing: Sit to/from stand;Minimal assistance   Upper Body Dressing : Sitting;Supervision/safety;Set up   Lower Body Dressing: Sit to/from stand;Minimal assistance   Toilet Transfer: BSC;Ambulation;RW;Minimal assistance                   Vision Patient Visual Report: No change from baseline       Perception     Praxis      Pertinent Vitals/Pain Pain Assessment: 0-10 Pain Score: 7  Pain Location: residual limb Pain Descriptors / Indicators: Aching Pain Intervention(s): Limited activity within patient's tolerance;Monitored during session;Repositioned;Other (comment)(PCA utilized)     Hand Dominance     Extremity/Trunk Assessment Upper Extremity Assessment Upper Extremity Assessment: Overall WFL for tasks assessed;Generalized weakness(R UE grossly 4/5, L grossly 4-/5)   Lower Extremity Assessment Lower Extremity Assessment: Defer to PT evaluation;Generalized weakness(R LE very pain limited s/p R BKA, knee ROM 0-35, min L ankle AROM )   Cervical / Trunk Assessment Cervical / Trunk Assessment: Normal   Communication Communication Communication: No difficulties   Cognition Arousal/Alertness: Awake/alert;Suspect due to medications Behavior During Therapy: [facility] for tasks assessed/performed Overall Cognitive Status: Within Functional Limits for tasks assessed  General Comments: near end of session became drowzy, did use PCA pump during session   General Comments      Exercises Exercises:  Other Exercises Other Exercises: pt instructed in desensitization strategies for residual limb as well as positioning strategies to support knee extension Other Exercises: pt instructed  in cognitive behavioral pain coping strategies to better self mgt surgical and phantom pains   Shoulder Instructions      Home Living Family/patient expects to be discharged to:: Skilled nursing facility Living Arrangements: Parent;Children;Other relatives(father, grandmother, and 93yo son)                               Additional Comments: Pt has 1+1 step to enter home, uses w/c much of the time, 1 floor home with single step down to some rooms.  bathroom next to his room with no stair requirements      Prior Functioning/Environment Level of Independence: Independent with assistive device(s)        Comments: Pt has been cooking meals for his grandma and son (tries to sit on stool in kitchen and son brings him materials).  Tends to stay mostly in bed but will ambulate short distances in home (gets fatigued quickly at times) with walker.  Pt reports he struggles to use w/c in home (does not fit considering his grandma is already in a w/c in the home and it is a small home per pt report)        OT Problem List: Decreased strength;Decreased knowledge of use of DME or AE;Decreased range of motion;Cardiopulmonary status limiting activity;Impaired balance (sitting and/or standing);Pain      OT Treatment/Interventions: Self-care/ADL training;Balance training;Therapeutic exercise;Therapeutic activities;DME and/or AE instruction;Patient/family education;Manual therapy    OT Goals(Current goals can be found in the care plan section) Acute Rehab OT Goals Patient Stated Goal: get stronger at rehab, control the pain OT Goal Formulation: With patient Time For Goal Achievement: 11/03/18 Potential to Achieve Goals: Good ADL Goals Pt Will Perform Lower Body Dressing: sit to/from stand;with supervision Pt Will Transfer to Toilet: with min guard assist;ambulating(LRAD for amb, BSC over toilet) Additional ADL Goal #1: Pt will independently demonstrate desensitization strategies for  residual limb to support eventual use of prosthetic. Additional ADL Goal #2: Pt will independently demonstrate proper positioning for residual limb to minimize risk of flexion contracture.  OT Frequency: Min 2X/week   Barriers to D/C:            Co-evaluation              AM-PAC OT "6 Clicks" Daily Activity     Outcome Measure Help from another person eating meals?: None Help from another person taking care of personal grooming?: A Little Help from another person toileting, which includes using toliet, bedpan, or urinal?: A Lot Help from another person bathing (including washing, rinsing, drying)?: A Lot Help from another person to put on and taking off regular upper body clothing?: None Help from another person to put on and taking off regular lower body clothing?: A Little 6 Click Score: 18   End of Session    Activity Tolerance: Patient tolerated treatment well Patient left: in chair;with call bell/phone within reach;with chair alarm set  OT Visit Diagnosis: Other abnormalities of gait and mobility (R26.89);Muscle weakness (generalized) (M62.81);Pain Pain - Right/Left: Right Pain - part of body: Knee;Leg  Time: 2301-7209 OT Time Calculation (min): 17 min Charges:  OT General Charges $OT Visit: 1 Visit OT Evaluation $OT Eval Moderate Complexity: 1 Mod OT Treatments $Therapeutic Activity: 8-22 mins  Jeni Salles, MPH, MS, OTR/L ascom [phone] 10/20/18, 10:53 AM

## 2018-10-20 NOTE — TOC Initial Note (Signed)
Transition of Care [facility]) - Initial/Assessment Note    Patient Details  Name: Shawn [name] MRN: [id number] Date of Birth: 22-Jun-1986  [facility]) CM/SW Contact:    Shela Leff, LCSW Phone Number: 10/20/2018, 3:51 PM  Clinical Narrative:              CSW spoke with patient regarding PT recommendations for short term rehab. Patient is known to CSW from previous admissions. He remembered that he did not have any home health benefits as he has exhausted them. Patient is now willing for CSW to conduct a bed search for placement and states that he is in agreement [place] go out of county if needed. Bed search initiated and will need to follow up with bed offers.      Expected Discharge Plan: Skilled Nursing Facility Barriers to Discharge: No Barriers Identified   Patient Goals and CMS Choice Patient states their goals for this hospitalization and ongoing recovery are:: lessen pain CMS Medicare.gov Compare Post Acute Care list provided to:: Patient Choice offered to / list presented to : Patient  Expected Discharge Plan and Services Expected Discharge Plan: [place]       Living arrangements for the past 2 months: Single Family Home Expected Discharge Date: 10/20/18                        Prior Living Arrangements/Services Living arrangements for the past 2 months: Single Family Home Lives with:: Minor Children, Parents Patient language and need for interpreter reviewed:: Yes Do you feel safe going back to the place where you live?: Yes      Need for Family Participation in Patient Care: Yes (Comment) Care giver support system in place?: Yes (comment)   Criminal Activity/Legal Involvement Pertinent to Current Situation/Hospitalization: No - Comment as needed  Activities of Daily Living Home Assistive Devices/Equipment: Wheelchair ADL Screening (condition at time of admission) Patient's cognitive ability adequate to safely complete daily activities?:  Yes Is the patient deaf or have difficulty hearing?: No Does the patient have difficulty seeing, even when wearing glasses/contacts?: No Does the patient have difficulty concentrating, remembering, or making decisions?: No Patient able to express need for assistance with ADLs?: Yes Does the patient have difficulty dressing or bathing?: No Independently performs ADLs?: Yes (appropriate for developmental age) Does the patient have difficulty walking or climbing stairs?: Yes Weakness of Legs: Both Weakness of Arms/Hands: None  Permission Sought/Granted Permission sought to share information with : Facility Sport and exercise psychologist                Emotional Assessment Appearance:: Appears stated age   Affect (typically observed): Accepting, Calm, Flat Orientation: : Oriented to Self, Oriented to Place, Oriented to  Time, Oriented to Situation Alcohol / Substance Use: Not Applicable Psych Involvement: No (comment)  Admission diagnosis:  Gangrene right foot Patient Active Problem List   Diagnosis Date Noted  . 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 Hyperglycemia 10/11/2018  . Pressure injury of skin 09/20/2018  . Uncontrolled diabetes mellitus ([place]) 09/19/2018  . Neurogenic pain 09/13/2018  . Marijuana use 09/13/2018  . Long term prescription benzodiazepine use 09/13/2018  . Insulin-dependent diabetes mellitus with renal complications ([place]) 96/78/9381  . Vitamin D deficiency 08/21/2018  . Hypocalcemia 08/21/2018  . CKD stage G3a/A1, GFR 45-59 and albumin creatinine ratio <30 mg/g (H[facility]) 08/21/2018  . History of acute renal failure 08/21/2018  . HHNC (hyperglycemic hyperosmolar nonketotic coma) (  [place]) 08/16/2018  . Chronic foot pain (Primary Area of Pain) (Bilateral) (L>R) 08/14/2018  . Chronic lower extremity pain (Secondary Area of Pain) (Bilateral) (L>R) 08/14/2018  . Chronic generalized abdominal pain [facility] Area of Pain) 08/14/2018  .  Chronic pain syndrome 08/14/2018  . Long term current use of opiate analgesic 08/14/2018  . Pharmacologic therapy 08/14/2018  . Disorder of skeletal system 08/14/2018  . Problems influencing health status 08/14/2018  . Nausea & vomiting 07/16/2018  . IV infusion line dysfunction ([place]) 07/09/2018  . Hyperglycemic hyperosmolar nonketotic coma ([place]) 05/28/2018  . Acute renal failure (ARF) ([place]) 04/27/2018  . Left foot infection 04/05/2018  . Foot infection 02/06/2018  . Local infection of the skin and subcutaneous tissue, unspecified 02/06/2018  . Hypertension associated with diabetes ([place]) 01/16/2018  . Osteomyelitis ([place]) 01/10/2018  . Cellulitis 12/29/2017  . Sepsis ([place]) 12/18/2017  . Moderate recurrent major depression ([place]) 11/10/2017  . Type 2 diabetes mellitus with hyperosmolar nonketotic hyperglycemia ([place]) 11/09/2017  . History of migraine 07/15/2017  . IBS (irritable bowel syndrome) 07/15/2017  . Protein-calorie malnutrition, severe 07/14/2017  . Abdominal pain 07/13/2017  . Diarrhea 06/01/2013  . 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 GERD (gastroesophageal reflux disease) 12/30/2009  . Type 1 diabetes mellitus with diabetic polyneuropathy ([place]) 11/25/2009  . MDD (major depressive disorder) 11/25/2009  . Hypercholesterolemia 03/07/2008   PCP:  Valera Castle, MD Pharmacy:   [place], [place] [address] Phone: [phone] Fax: [phone]     Social Determinants of Health (SDOH) Interventions    Readmission Risk Interventions Readmission Risk Prevention Plan 10/13/2018 09/21/2018 09/21/2018  Transportation Screening Complete Complete Complete  Medication Review Press photographer) Complete Complete Complete  PCP or Specialist appointment within 3-5 days of discharge Complete Complete Complete  HRI or Home Care Consult Complete Complete -  SW Recovery Care/Counseling Consult (No Data) Complete -   Palliative Care Screening Not Applicable Not Applicable Not Applicable  Skilled Nursing Facility Not Applicable Not Applicable -  Some recent data might be hidden

## 2018-10-20 NOTE — Progress Notes (Signed)
Patient ID: Shawn [name], male   DOB: 09-09-1985, 33 y.o.   MRN: [id number]  [employer] PROGRESS NOTE  Shawn [name] HGD:[id number] DOB: 04/30/1986 DOA: 10/18/2018 PCP: Valera Castle, MD  HPI/Subjective: Last night got sick with eating.  This morning feeling better without any nausea or vomiting.  Still having a lot of pain in his surgical site.  PCA pump helping.  Objective: Vitals:   10/20/18 0436 10/20/18 0804  BP: (!) 134/94   Pulse: (!) 110   Resp: 16 16  Temp: 98.6 F (37 C)   SpO2: 98% 100%    Filed Weights   10/18/18 0916 10/19/18 0500  Weight: 52.7 kg 52.9 kg    ROS: Review of Systems  Constitutional: Negative for chills and fever.  Eyes: Negative for blurred vision.  Respiratory: Negative for cough and shortness of breath.   Cardiovascular: Negative for chest pain.  Gastrointestinal: Positive for constipation. Negative for abdominal pain, diarrhea, nausea and vomiting.  Genitourinary: Negative for dysuria.  Musculoskeletal: Positive for joint pain.  Neurological: Negative for dizziness and headaches.   Exam: Physical Exam  Constitutional: He is oriented to person, place, and time.  HENT:  Nose: No mucosal edema.  Mouth/Throat: No oropharyngeal exudate or posterior oropharyngeal edema.  Eyes: Pupils are equal, round, and reactive to light. Conjunctivae, EOM and lids are normal.  Neck: No JVD present. Carotid bruit is not present. No edema present. No thyroid mass and no thyromegaly present.  Cardiovascular: S1 normal and S2 normal. Tachycardia present. Exam reveals no gallop.  No murmur heard. Pulses:      Dorsalis pedis pulses are 2+ on the left side.  Respiratory: No respiratory distress. He has no wheezes. He has no rhonchi. He has no rales.  GI: Soft. Bowel sounds are normal. There is no abdominal tenderness.  Musculoskeletal:     Right shoulder: He exhibits no swelling.  Lymphadenopathy:    He has no cervical adenopathy.   Neurological: He is alert and oriented to person, place, and time. No cranial nerve deficit.  Skin: Skin is warm. Nails show no clubbing.  Right lower extremity amputation site covered in pressure dressing.  Left foot prior amputation site healed well.  Psychiatric: He has a normal mood and affect.      Data Reviewed: Basic Metabolic Panel: Recent Labs  Lab 10/18/18 0857 10/19/18 0345 10/20/18 0559  NA 135 135 135  K 3.6 3.6 3.6  CL 107 106 106  CO2 20* 21* 21*  GLUCOSE 351* 231* 81  BUN 24* 30* 39*  CREATININE 2.21* 2.36* 2.03*  CALCIUM 8.2* 7.8* 8.2*  MG  --   --  1.7   CBC: Recent Labs  Lab 10/18/18 0857 10/19/18 0345 10/19/18 1830 10/20/18 0559  WBC 10.6* 13.5*  --  14.3*  NEUTROABS 7.8*  --   --   --   HGB 8.5* 6.5* 8.1* 8.3*  HCT 26.1* 20.8* 24.9* 26.3*  MCV 84.5 87.0  --  86.2  PLT 345 300  --  255    CBG: Recent Labs  Lab 10/19/18 1140 10/19/18 1640 10/19/18 2111 10/20/18 0733 10/20/18 0819  GLUCAP 143* 130* 184* 65* 155*    Scheduled Meds: . calcium carbonate  500 mg Oral BID WC  . docusate sodium  100 mg Oral BID  . enoxaparin (LOVENOX) injection  40 mg Subcutaneous Q24H  . feeding supplement (GLUCERNA SHAKE)  237 mL Oral TID BM  . ferrous sulfate  325 mg Oral BID  WC  . gabapentin  300 mg Oral BID  . HYDROmorphone   Intravenous Q4H  . insulin aspart  0-5 Units Subcutaneous QHS  . insulin aspart  0-9 Units Subcutaneous TID WC  . insulin aspart  4 Units Subcutaneous TID WC  . insulin glargine  25 Units Subcutaneous BID  . multivitamin with minerals  1 tablet Oral Daily  . polyethylene glycol  17 g Oral Daily  . vitamin B-12  5,000 mcg Oral Daily  . ascorbic acid  250 mg Oral BID  . zolpidem  10 mg Oral QHS   Continuous Infusions:   Assessment/Plan:  1. Postoperative anemia.  Patient responded to blood transfusion yesterday.  Hemoglobin up from 6.5-8.3. 2. 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  Patient had  right leg gangrene which was the reason for amputation.  On PCA pump.  Hopefully PCA pump can be discontinued tomorrow added Percocet 10/325 every 4 hours as needed. 3. Uncontrolled type 2 diabetes mellitus with relative hypoglycemia this morning with a sugar of 65.  Decrease Lantus to 25 units twice daily.  Patient eating this morning.  Patient did not eat very well last night. 4. Chronic kidney disease stage III.  Monitor closely. 5. Diabetic neuropathy on gabapentin 6. Constipation start MiraLAX. 7. Physical therapy evaluation needed.  Patient will likely be mostly wheelchair-bound but need to see if he can use crutches and balance on his other foot.  Code Status:     Code Status Orders  (From admission, onward)         Start     Ordered   10/18/18 1237  Full code  Continuous     10/18/18 1242        Code Status History    Date Active Date Inactive Code Status Order ID Comments User Context   10/11/2018 1157 10/13/2018 1654 Full Code [id number]  Hillary Bow, MD Inpatient   09/19/2018 2049 09/21/2018 1851 Full Code [id number]  Saundra Shelling, MD Inpatient   08/16/2018 1203 08/18/2018 2142 Full Code [id number]  Hillary Bow, MD ED   07/16/2018 1739 07/18/2018 0104 Full Code [id number]  Dustin Flock, MD Inpatient   07/10/2018 0111 07/11/2018 1955 Full Code [id number]  Salary, Avel Peace, MD Inpatient   06/14/2018 1744 06/20/2018 2123 Full Code [id number]  Loletha Grayer, MD ED   05/28/2018 0437 05/28/2018 1926 Full Code [id number]  Arta Silence, MD ED   04/27/2018 1726 05/02/2018 1713 Full Code [id number]  Henreitta Leber, MD Inpatient   04/05/2018 1724 04/11/2018 2142 Full Code [id number]  Dustin Flock, MD Inpatient   02/06/2018 2022 02/07/2018 1512 Full Code [id number]  Demetrios Loll, MD Inpatient   01/10/2018 2259 01/11/2018 1947 Full Code [id number]  Amelia Jo, MD Inpatient   12/30/2017 0019 01/03/2018 1457 Full Code [id number]  Harrie Foreman, MD Inpatient   12/20/2017 1541 12/27/2017 2147  Full Code [id number]  Sharlotte Alamo, [facility] Inpatient   11/10/2017 0049 11/11/2017 1632 Full Code [id number]  Amelia Jo, MD Inpatient   07/13/2017 2112 07/15/2017 1730 Full Code [id number]  Jules Husbands, MD ED      Disposition Plan: To be determined  Consultants:  Vascular surgery  Procedures:  Right leg below-knee amputation  Time spent: 27 minutes  [place]

## 2018-10-20 NOTE — Evaluation (Signed)
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    Clinical Impression  Pt anxious and pain limited t/o session, but still willing to participate with PT eval and treat.  He was able to do some limited in-room ambulation (walker, L walking boot) but fatigued very quickly, needed a lot of cuing and encouragement and had c/o considerable pain t/o the entire session.  Pt with pain limited ROM and available strength in residual limb but was able to participate with some AROM very guarded exercises and education about positioning and HEP.  Pt's HR in the 120s much of the time and regular need for quick rest breaks secondary to pain.  Pt would not be safe returning home at this point and will need STR to get to a more functional and home appropriate situation.     Follow Up Recommendations SNF    Equipment Recommendations  Other (comment)(TBD at next venue of care)    Recommendations for Other Services       Precautions / Restrictions Precautions Precautions: Fall Restrictions Weight Bearing Restrictions: Yes RLE Weight Bearing: Non weight bearing(R BKA)      Mobility  Bed Mobility Overal bed mobility: Modified Independent             General bed mobility comments: Pt slow secondary to pain in R limb, very guarded but able to get to sitting w/o assist  Transfers Overall transfer level: Needs assistance Equipment used: Rolling walker (2 wheeled) Transfers: Sit to/from Stand Sit to Stand: Min assist         General transfer comment: Pt again pain limited/guarded and needs encouragement to get to standing.  He did not need direct physical assist but is anxious and fearful.  L walking boot donned for standing  Ambulation/Gait Ambulation/Gait assistance: Min assist Gait Distance (Feet):  15 Feet Assistive device: Rolling walker (2 wheeled)       General Gait Details: Pt with slow, labored and guarded hopping.  Pt exhausted with this modest distance and unable to take full hopping steps or show much confidence.  He was clearly in pain and generally discouraged by his limitations.  Stairs            Wheelchair Mobility    Modified Rankin (Stroke Patients Only)       Balance Overall balance assessment: Needs assistance Sitting-balance support: No upper extremity supported Sitting balance-Leahy Scale: Good Sitting balance - Comments: Pt able to maintain sitting balance relatively well at EOB   Standing balance support: Bilateral upper extremity supported Standing balance-Leahy Scale: Poor Standing balance comment: Pt able to maintain static standing with walker, had difficulty with all dynamic standing with heavy UE reliance and quickly to fatigue with any activity.                             Pertinent Vitals/Pain      Home Living Family/patient expects to be discharged to:: Skilled nursing facility Living Arrangements: Parent;Children;Other relatives               Additional Comments: Pt has 1+1 step to enter home, uses w/c much of the time, 1 floor home with single step down to some rooms.  bathroom next to his room with no stair requirements    Prior  Function Level of Independence: Independent with assistive device(s)         Comments: Pt has been cooking meals for his grandma and son (tries to sit on stool in kitchen and son brings him materials).  Tends to stay mostly in bed but will ambulate short distances in home (gets fatigued quickly at times) with walker.  Pt reports he struggles to use w/c in home (does not fit considering his grandma is already in a w/c in the home and it is a small home per pt report)     Hand Dominance        Extremity/Trunk Assessment   Upper Extremity Assessment Upper Extremity Assessment:  Generalized weakness;Overall [facility] for tasks assessed(R UE grossly 4/5, L grossly 4-/5)    Lower Extremity Assessment Lower Extremity Assessment: Generalized weakness(R LE very pain limited, knee ROM 0-35, min L ankle AROM )       Communication   Communication: No difficulties  Cognition Arousal/Alertness: Awake/alert Behavior During Therapy: WFL for tasks assessed/performed Overall Cognitive Status: Within Functional Limits for tasks assessed                                        General Comments General comments (skin integrity, edema, etc.): pt's HR >100 t/o the session and up to ~130 with any activity.    Exercises     Assessment/Plan    PT Assessment Patient needs continued PT services  PT Problem List Decreased strength;Decreased range of motion;Decreased activity tolerance;Decreased balance;Decreased mobility;Decreased knowledge of use of DME;Decreased safety awareness;Pain;Cardiopulmonary status limiting activity       PT Treatment Interventions DME instruction;Gait training;Stair training;Therapeutic activities;Therapeutic exercise;Balance training;Neuromuscular re-education;Functional mobility training;Wheelchair mobility training;Patient/family education    PT Goals (Current goals can be found in the Care Plan section)  Acute Rehab PT Goals Patient Stated Goal: get stronger at rehab, control the pain PT Goal Formulation: With patient Time For Goal Achievement: 11/03/18 Potential to Achieve Goals: Fair    Frequency 7X/week   Barriers to discharge        Co-evaluation               AM-PAC PT "6 Clicks" Mobility  Outcome Measure Help needed turning from your back to your side while in a flat bed without using bedrails?: None Help needed moving from lying on your back to sitting on the side of a flat bed without using bedrails?: None Help needed moving to and from a bed to a chair (including a wheelchair)?: A Little Help needed standing  up from a chair using your arms (e.g., wheelchair or bedside chair)?: A Little Help needed to walk in hospital room?: A Lot Help needed climbing 3-5 steps with a railing? : Total 6 Click Score: 17    End of Session Equipment Utilized During Treatment: Gait belt Activity Tolerance: Patient limited by fatigue;Patient limited by pain Patient left: with call bell/phone within reach;with chair alarm set   PT Visit Diagnosis: Muscle weakness (generalized) (M62.81);Difficulty in walking, not elsewhere classified (R26.2)    Time: 8938-1017 PT Time Calculation (min) (ACUTE ONLY): 45 min   Charges:   PT Evaluation $PT Eval Moderate Complexity: 1 Mod PT Treatments $Gait Training: 8-22 mins $Therapeutic Exercise: 8-22 mins        Kreg Shropshire, DPT 10/20/2018, 9:50 AM

## 2018-10-20 NOTE — Progress Notes (Signed)
Inpatient Diabetes Program Recommendations  AACE/ADA: New Consensus Statement on Inpatient Glycemic Control  Target Ranges:  Prepandial:   less than 140 mg/dL      Peak postprandial:   less than 180 mg/dL (1-2 hours)      Critically ill patients:  140 - 180 mg/dL   Results for KAIDYN, JAVID (MRN [id number]) as of 10/20/2018 07:56  Ref. Range 10/19/2018 07:55 10/19/2018 11:40 10/19/2018 16:40 10/19/2018 21:11 10/20/2018 07:33  Glucose-Capillary Latest Ref Range: 70 - 99 mg/dL 202 (H) 143 (H) 130 (H) 184 (H) 65 (L)   Review of Glycemic Control  Diabetes history:DM1 (makes NO insulin; is sensitive to insulin; requires basal, correction, and meal coverage insulin) Outpatient Diabetes medications:Lantus 40units BID, Novolog 4 units TID with meals Current orders for Inpatient glycemic control:Lantus 28units BID, Novolog 0-9 units TID with meals, Novolog 0-5 units QHS, Novolog 4 units TID with meals for meal coverage  Inpatient Diabetes Program Recommendations:   Insulin - Basal: Fasting glucose 65 mg/dl today. Please consider decreasing Lantus to 25 units BID.  Thanks, [name], RN, MSN, CDE Diabetes Coordinator Inpatient Diabetes Program [phone] (Team Pager from 8am to 5pm)

## 2018-10-21 LAB — BASIC METABOLIC PANEL
Anion gap: 7 (ref 5–15)
BUN: 55 mg/dL — ABNORMAL HIGH (ref 6–20)
CO2: 23 mmol/L (ref 22–32)
Calcium: 7.8 mg/dL — ABNORMAL LOW (ref 8.9–10.3)
Chloride: 103 mmol/L (ref 98–111)
Creatinine, Ser: 2.78 mg/dL — ABNORMAL HIGH (ref 0.61–1.24)
GFR calc Af Amer: 34 mL/min — ABNORMAL LOW (ref >60)
GFR calc non Af Amer: 29 mL/min — ABNORMAL LOW (ref >60)
Glucose, Bld: 109 mg/dL — ABNORMAL HIGH (ref 70–99)
Potassium: 4 mmol/L (ref 3.5–5.1)
Sodium: 133 mmol/L — ABNORMAL LOW (ref 135–145)

## 2018-10-21 LAB — CBC
HCT: 21.2 % — ABNORMAL LOW (ref 39.0–52.0)
Hemoglobin: 6.6 g/dL — ABNORMAL LOW (ref 13.0–17.0)
MCH: 26.9 pg (ref 26.0–34.0)
MCHC: 31.1 g/dL (ref 30.0–36.0)
MCV: 86.5 fL (ref 80.0–100.0)
Platelets: 224 10*3/uL (ref 150–400)
RBC: 2.45 MIL/uL — ABNORMAL LOW (ref 4.22–5.81)
RDW: 13.8 % (ref 11.5–15.5)
WBC: 14.1 10*3/uL — ABNORMAL HIGH (ref 4.0–10.5)
nRBC: 0 % (ref 0.0–0.2)

## 2018-10-21 LAB — GLUCOSE, CAPILLARY
Glucose-Capillary: 89 mg/dL (ref 70–99)
Glucose-Capillary: 114 mg/dL — ABNORMAL HIGH (ref 70–99)
Glucose-Capillary: 64 mg/dL — ABNORMAL LOW (ref 70–99)
Glucose-Capillary: 133 mg/dL — ABNORMAL HIGH (ref 70–99)
Glucose-Capillary: 241 mg/dL — ABNORMAL HIGH (ref 70–99)

## 2018-10-21 LAB — MAGNESIUM: Magnesium: 1.9 mg/dL (ref 1.7–2.4)

## 2018-10-21 LAB — PREPARE RBC (CROSSMATCH)

## 2018-10-21 MED ORDER — ENOXAPARIN SODIUM 30 MG/0.3ML ~~LOC~~ SOLN
30.0000 mg | SUBCUTANEOUS | Status: DC
  Administered 2018-10-21 – 2018-10-25 (×5): 30 mg via SUBCUTANEOUS
  Filled 2018-10-21 (×5): qty 0.3

## 2018-10-21 MED ORDER — SODIUM CHLORIDE 0.9% IV SOLUTION: Freq: Once | INTRAVENOUS | Status: DC

## 2018-10-21 MED ORDER — HYDROMORPHONE HCL 1 MG/ML IJ SOLN
2.0000 mg | INTRAMUSCULAR | Status: DC | PRN
  Administered 2018-10-21 – 2018-10-23 (×9): 2 mg via INTRAVENOUS
  Filled 2018-10-21 (×10): qty 2

## 2018-10-21 MED ORDER — ALUM & MAG HYDROXIDE-SIMETH 200-200-20 MG/5ML PO SUSP
30.0000 mL | ORAL | Status: DC | PRN
  Administered 2018-10-21 (×3): 30 mL via ORAL
  Filled 2018-10-21 (×3): qty 30

## 2018-10-21 NOTE — Progress Notes (Signed)
Dilaudid PCA stopped per order. 39ml wasted in stericycle witnessed by Anabella RN and Lurline Hare.

## 2018-10-21 NOTE — Progress Notes (Signed)
3 Days Post-Op   Subjective/Chief Complaint: Complains of pain overnight. Unchanged. Notes current regimen helps. Currently receiving pRBC for HgB 6.6. Asymptomatic. No signs of over bleeding.   Objective: Vital signs in last 24 hours: Temp:  [97.7 F (36.5 C)-100.4 F (38 C)] 99.6 F (37.6 C) (04/18 0742) Pulse Rate:  [102-124] 102 (04/18 0704) Resp:  [12-20] 20 (04/18 0819) BP: (119-135)/(78-96) 123/87 (04/18 0704) SpO2:  [95 %-100 %] 100 % (04/18 0819) Last BM Date: 10/20/18  Intake/Output from previous day: 04/17 0701 - 04/18 0700 In: -  Out: 1025 [Urine:1025] Intake/Output this shift: No intake/output data recorded.  General appearance: alert and mild distress Resp: clear to auscultation bilaterally Cardio: regular rate and rhythm Extremities: Soft, no bleeding, intact  Lab Results:  Recent Labs    10/20/18 0559 10/21/18 0440  WBC 14.3* 14.1*  HGB 8.3* 6.6*  HCT 26.3* 21.2*  PLT 255 224   BMET Recent Labs    10/20/18 0559 10/21/18 0440  NA 135 133*  K 3.6 4.0  CL 106 103  CO2 21* 23  GLUCOSE 81 109*  BUN 39* 55*  CREATININE 2.03* 2.78*  CALCIUM 8.2* 7.8*   PT/INR No results for input(s): LABPROT, INR in the last 72 hours. ABG No results for input(s): PHART, HCO3 in the last 72 hours.  Invalid input(s): PCO2, PO2  Studies/Results: No results found.  Anti-infectives: Anti-infectives (From admission, onward)   Start     Dose/Rate Route Frequency Ordered Stop   10/18/18 1830  clindamycin (CLEOCIN) IVPB 300 mg     300 mg 100 mL/hr over 30 Minutes Intravenous Every 8 hours 10/18/18 1242 10/19/18 1831   10/18/18 0930  clindamycin (CLEOCIN) 300 MG/50ML IVPB    Note to Pharmacy:  Lyman Bishop   : cabinet override      10/18/18 0930 10/18/18 1050   10/18/18 0600  clindamycin (CLEOCIN) IVPB 300 mg     300 mg 100 mL/hr over 30 Minutes Intravenous On call to O.R. 10/17/18 2139 10/18/18 1120      Assessment/Plan: s/p Procedure(s): AMPUTATION  BELOW KNEE (Right) Pain control- Would d/c PCA as tolerated.  Monitor stump. No overt sign of bleeding from stump Transfuse as needed. Monitor Cr. OOB with PT Change dressing tomorrow then daily.  LOS: 3 days    Shawn [name] 10/21/2018

## 2018-10-21 NOTE — Progress Notes (Signed)
Patient ID: Shawn [name], male   DOB: May 25, 1986, 33 y.o.   MRN: [id number]  [employer] PROGRESS NOTE  LAMONE FERRELLI AOZ:[id number] DOB: March 07, 1986 DOA: 10/18/2018 PCP: Valera Castle, MD  HPI/Subjective: Patient continues to have pain   Objective: Vitals:   10/21/18 1128 10/21/18 1223  BP:  135/87  Pulse:  (!) 109  Resp: 12   Temp:  98.5 F (36.9 C)  SpO2: 98% 91%    Filed Weights   10/18/18 0916 10/19/18 0500  Weight: 52.7 kg 52.9 kg    ROS: Review of Systems  Constitutional: Negative for chills and fever.  Eyes: Negative for blurred vision.  Respiratory: Negative for cough and shortness of breath.   Cardiovascular: Negative for chest pain.  Gastrointestinal: Positive for constipation. Negative for abdominal pain, diarrhea, nausea and vomiting.  Genitourinary: Negative for dysuria.  Musculoskeletal: Positive for joint pain.  Neurological: Negative for dizziness and headaches.   Exam: Physical Exam  Constitutional: He is oriented to person, place, and time.  HENT:  Nose: No mucosal edema.  Mouth/Throat: No oropharyngeal exudate or posterior oropharyngeal edema.  Eyes: Pupils are equal, round, and reactive to light. Conjunctivae, EOM and lids are normal.  Neck: No JVD present. Carotid bruit is not present. No edema present. No thyroid mass and no thyromegaly present.  Cardiovascular: S1 normal and S2 normal. Tachycardia present. Exam reveals no gallop.  No murmur heard. Pulses:      Dorsalis pedis pulses are 2+ on the left side.  Respiratory: No respiratory distress. He has no wheezes. He has no rhonchi. He has no rales.  GI: Soft. Bowel sounds are normal. There is no abdominal tenderness.  Musculoskeletal:     Right shoulder: He exhibits no swelling.  Lymphadenopathy:    He has no cervical adenopathy.  Neurological: He is alert and oriented to person, place, and time. No cranial nerve deficit.  Skin: Skin is warm. Nails show no clubbing.   Right lower extremity amputation site covered in pressure dressing.  Left foot prior amputation site healed well.  Psychiatric: He has a normal mood and affect.      Data Reviewed: Basic Metabolic Panel: Recent Labs  Lab 10/18/18 0857 10/19/18 0345 10/20/18 0559 10/21/18 0440  NA 135 135 135 133*  K 3.6 3.6 3.6 4.0  CL 107 106 106 103  CO2 20* 21* 21* 23  GLUCOSE 351* 231* 81 109*  BUN 24* 30* 39* 55*  CREATININE 2.21* 2.36* 2.03* 2.78*  CALCIUM 8.2* 7.8* 8.2* 7.8*  MG  --   --  1.7 1.9   CBC: Recent Labs  Lab 10/18/18 0857 10/19/18 0345 10/19/18 1830 10/20/18 0559 10/21/18 0440  WBC 10.6* 13.5*  --  14.3* 14.1*  NEUTROABS 7.8*  --   --   --   --   HGB 8.5* 6.5* 8.1* 8.3* 6.6*  HCT 26.1* 20.8* 24.9* 26.3* 21.2*  MCV 84.5 87.0  --  86.2 86.5  PLT 345 300  --  255 224    CBG: Recent Labs  Lab 10/20/18 1137 10/20/18 1646 10/20/18 2138 10/21/18 0743 10/21/18 1138  GLUCAP 110* 154* 149* 89 114*    Scheduled Meds: . sodium chloride   Intravenous Once  . calcium carbonate  500 mg Oral BID WC  . docusate sodium  100 mg Oral BID  . enoxaparin (LOVENOX) injection  30 mg Subcutaneous Q24H  . feeding supplement (GLUCERNA SHAKE)  237 mL Oral TID BM  . ferrous sulfate  325  mg Oral BID WC  . gabapentin  300 mg Oral BID  . HYDROmorphone   Intravenous Q4H  . insulin aspart  0-5 Units Subcutaneous QHS  . insulin aspart  0-9 Units Subcutaneous TID WC  . insulin aspart  4 Units Subcutaneous TID WC  . insulin glargine  25 Units Subcutaneous BID  . multivitamin with minerals  1 tablet Oral Daily  . polyethylene glycol  17 g Oral Daily  . vitamin B-12  5,000 mcg Oral Daily  . ascorbic acid  250 mg Oral BID  . zolpidem  10 mg Oral QHS   Continuous Infusions:   Assessment/Plan:  1. Postoperative anemia.  Being transfused follow CBC in the morning 2. 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  On PCA pump.  Discussed with the nurse we will discontinue the PCA start patient on IV and oral medications in preparation for discharge 3. Uncontrolled type 2 diabetes mellitus labile control continue insulin  4. chronic kidney disease stage III.  Monitor closely. 5. Diabetic neuropathy on gabapentin 6. Constipation start MiraLAX. 7. Physical therapy evaluation needed.  Patient will likely be mostly wheelchair-bound but need to see if he can use crutches and balance on his other foot.  Code Status:     Code Status Orders  (From admission, onward)         Start     Ordered   10/18/18 1237  Full code  Continuous     10/18/18 1242        Code Status History    Date Active Date Inactive Code Status Order ID Comments User Context   10/11/2018 1157 10/13/2018 1654 Full Code [id number]  Hillary Bow, MD Inpatient   09/19/2018 2049 09/21/2018 1851 Full Code [id number]  Saundra Shelling, MD Inpatient   08/16/2018 1203 08/18/2018 2142 Full Code [id number]  Hillary Bow, MD ED   07/16/2018 1739 07/18/2018 0104 Full Code [id number]  Dustin Flock, MD Inpatient   07/10/2018 0111 07/11/2018 1955 Full Code [id number]  Salary, Avel Peace, MD Inpatient   06/14/2018 1744 06/20/2018 2123 Full Code [id number]  Loletha Grayer, MD ED   05/28/2018 0437 05/28/2018 1926 Full Code [id number]  Arta Silence, MD ED   04/27/2018 1726 05/02/2018 1713 Full Code [id number]  Henreitta Leber, MD Inpatient   04/05/2018 1724 04/11/2018 2142 Full Code [id number]  Dustin Flock, MD Inpatient   02/06/2018 2022 02/07/2018 1512 Full Code [id number]  Demetrios Loll, MD Inpatient   01/10/2018 2259 01/11/2018 1947 Full Code [id number]  Amelia Jo, MD Inpatient   12/30/2017 0019 01/03/2018 1457 Full Code [id number]  Harrie Foreman, MD Inpatient   12/20/2017 1541 12/27/2017 2147 Full Code [id number]  Sharlotte Alamo, [facility] Inpatient   11/10/2017 0049 11/11/2017 1632 Full Code [id number]  Amelia Jo, MD Inpatient   07/13/2017 2112  07/15/2017 1730 Full Code [id number]  Jules Husbands, MD ED      Disposition Plan: To be determined  Consultants:  Vascular surgery  Procedures:  Right leg below-knee amputation  Time spent: 33 minutes  [place]

## 2018-10-21 NOTE — Progress Notes (Signed)
PHARMACIST - PHYSICIAN COMMUNICATION  CONCERNING:  Enoxaparin (Lovenox) for DVT Prophylaxis    RECOMMENDATION: Patient was prescribed enoxaprin 40mg  q24 hours for VTE prophylaxis.   Filed Weights   10/18/18 0916 10/19/18 0500  Weight: 116 lb 2.9 oz (52.7 kg) 116 lb 10 oz (52.9 kg)    Body mass index is 18.82 kg/m.  Estimated Creatinine Clearance: 28.8 mL/min (A) (by C-G formula based on SCr of 2.78 mg/dL (H)).   Patient is candidate for enoxaparin 30mg  every 24 hours based on CrCl <43ml/min   DESCRIPTION: Pharmacy has adjusted enoxaparin dose per [facility] policy.  Patient is now receiving enoxaparin 30mg  every 24 hours.  Pernell Dupre, PharmD, BCPS Clinical Pharmacist 10/21/2018 6:42 AM

## 2018-10-21 NOTE — Progress Notes (Signed)
Per Vascular MD, Dr. Lorenso Courier change Right BKA dressing tomorrow 10/22/2018. Pt made aware.

## 2018-10-21 NOTE — Progress Notes (Signed)
PT Cancellation Note  Patient Details Name: Shawn [name] MRN: [id number] DOB: 06-Mar-1986   Cancelled Treatment:    Reason Eval/Treat Not Completed: Patient not medically ready     Reason Eval/Treat Not Completed: Medical issues which prohibited therapy(Pt. Hgb: 6.6, HCT: 21.2. Will continue to monitor, and resume PT when medically appropriate.)  Chesley Noon 10/21/2018, 11:09 AM

## 2018-10-21 NOTE — Progress Notes (Signed)
OT Cancellation Note  Patient Details Name: Shawn [name] MRN: [id number] DOB: [birth year]   Cancelled Treatment:    Reason Eval/Treat Not Completed: Medical issues which prohibited therapy(Pt. Hgb: 6.6, HCT: 21.2. Will continue to monitor, and resume OT when medically appropriate.)  Harrel Carina, MS, OTR/L 10/21/2018, 9:28 AM

## 2018-10-21 NOTE — Progress Notes (Signed)
Hypoglycemic Event  CBG: 64  Treatment: pt meal tray arrived pt ate 75% of his tray  Symptoms: None  Follow-up CBG: Time:1813 CBG Result:133  Possible Reasons for Event: Unknown/pt had good meal intake today >50% on all meals  Comments/MD notified: pt reported no symptoms     [facility]

## 2018-10-22 LAB — GLUCOSE, CAPILLARY
Glucose-Capillary: 89 mg/dL (ref 70–99)
Glucose-Capillary: 123 mg/dL — ABNORMAL HIGH (ref 70–99)
Glucose-Capillary: 141 mg/dL — ABNORMAL HIGH (ref 70–99)
Glucose-Capillary: 193 mg/dL — ABNORMAL HIGH (ref 70–99)
Glucose-Capillary: 179 mg/dL — ABNORMAL HIGH (ref 70–99)

## 2018-10-22 LAB — BPAM RBC
Blood Product Expiration Date: 202004202359
Blood Product Expiration Date: 202004282359
Blood Product Expiration Date: 202004282359
ISSUE DATE / TIME: 202004161044
ISSUE DATE / TIME: 202004180640
ISSUE DATE / TIME: 202004181223
Unit Type and Rh: 600
Unit Type and Rh: 600
Unit Type and Rh: 600

## 2018-10-22 LAB — TYPE AND SCREEN
ABO/RH(D): A NEG
Antibody Screen: NEGATIVE
Unit division: 0
Unit division: 0
Unit division: 0

## 2018-10-22 LAB — CBC
HCT: 28.9 % — ABNORMAL LOW (ref 39.0–52.0)
Hemoglobin: 9.4 g/dL — ABNORMAL LOW (ref 13.0–17.0)
MCH: 28.1 pg (ref 26.0–34.0)
MCHC: 32.5 g/dL (ref 30.0–36.0)
MCV: 86.3 fL (ref 80.0–100.0)
Platelets: 252 10*3/uL (ref 150–400)
RBC: 3.35 MIL/uL — ABNORMAL LOW (ref 4.22–5.81)
RDW: 13.6 % (ref 11.5–15.5)
WBC: 15.3 10*3/uL — ABNORMAL HIGH (ref 4.0–10.5)
nRBC: 0 % (ref 0.0–0.2)

## 2018-10-22 LAB — BASIC METABOLIC PANEL
Anion gap: 9 (ref 5–15)
BUN: 66 mg/dL — ABNORMAL HIGH (ref 6–20)
CO2: 21 mmol/L — ABNORMAL LOW (ref 22–32)
Calcium: 8.1 mg/dL — ABNORMAL LOW (ref 8.9–10.3)
Chloride: 101 mmol/L (ref 98–111)
Creatinine, Ser: 2.83 mg/dL — ABNORMAL HIGH (ref 0.61–1.24)
GFR calc Af Amer: 33 mL/min — ABNORMAL LOW (ref >60)
GFR calc non Af Amer: 28 mL/min — ABNORMAL LOW (ref >60)
Glucose, Bld: 109 mg/dL — ABNORMAL HIGH (ref 70–99)
Potassium: 4.6 mmol/L (ref 3.5–5.1)
Sodium: 131 mmol/L — ABNORMAL LOW (ref 135–145)

## 2018-10-22 LAB — MAGNESIUM: Magnesium: 2.2 mg/dL (ref 1.7–2.4)

## 2018-10-22 NOTE — Progress Notes (Signed)
PT Cancellation Note  Patient Details Name: Shawn [name] MRN: [id number] DOB: 26-Jul-1985   Cancelled Treatment:    Reason Eval/Treat Not Completed: Pain limiting ability to participate   Offered and encouraged session this am.  Pt reported he was in pain this am and had recently received pain medications.  Stated I would return in 1 hour when medication ws in effect but he requested to defer until after lunch.  Will try to accommodate pt as schedule allows.   Chesley Noon 10/22/2018, 11:19 AM

## 2018-10-22 NOTE — Progress Notes (Signed)
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      PT Comments    Pt agrees on second attempt with encouragement.  To edge of bed with supervision.  CAM boot donned to LLE for gait.  Stood with min a x 2.  He was able to ambulate to doorway with min/mod a x 2 and heavy reliance on walker.  LLE with CAM boot and externally rotated at hip while standing/stepping and unable to place toes facing forward.  He fatigues quickly with decreasing gait quality with distance and needs intervention to encourage pt to sit due to safety with gait.  Upon sitting he is observed holding his hand on his chest and reports some stabbing pain which resolved with rest.  RN notified.  Sats WFL and HR noted to be 120.  He refused to remain up in chair despite education and transferred back to bed with min a x 2.    Primary barrier is general weakness, pain and balance.  Pt wears CAM boot on LLE due to amuptations L toes per pt.  CAM boot impacts his ability for balance strategies and is difficult to advance with gait due to weight and restrictions.  May want to consider other shoe options if appropriate to allow for improved balance, mobility and safety due to new R BKA.    Pt plans to rely on wheelchair initially upon discharge.  If he does not agree or qualify for SNF upon discharge, therapy sessions that focus on transfers and overall safety may be beneficial for pt vs focus on gait training at this time.       Follow Up Recommendations  SNF     Equipment Recommendations  Rolling walker with 5" wheels;Wheelchair (measurements PT);Wheelchair cushion (measurements PT);3in1 (PT)    Recommendations for Other Services       Precautions / Restrictions Precautions Precautions: Fall Restrictions Weight Bearing  Restrictions: Yes RLE Weight Bearing: Non weight bearing Other Position/Activity Restrictions: s/p R BKA    Mobility  Bed Mobility Overal bed mobility: Modified Independent                Transfers Overall transfer level: Needs assistance Equipment used: Rolling walker (2 wheeled) Transfers: Sit to/from Stand Sit to Stand: Min assist;Mod assist         General transfer comment: increased assist for balance and to gait upright  Ambulation/Gait Ambulation/Gait assistance: Min assist;Mod assist;+2 physical assistance Gait Distance (Feet): 15 Feet Assistive device: Rolling walker (2 wheeled) Gait Pattern/deviations: Step-to pattern Gait velocity: decreased   General Gait Details: slow difficulty gait, +2 assist for safety with chair follow.   Stairs             Wheelchair Mobility    Modified Rankin (Stroke Patients Only)       Balance Overall balance assessment: Needs assistance Sitting-balance support: No upper extremity supported Sitting balance-Leahy Scale: Good     Standing balance support: Bilateral upper extremity supported Standing balance-Leahy Scale: Poor                              Cognition Arousal/Alertness: Awake/alert Behavior During Therapy: WFL for tasks assessed/performed Overall Cognitive Status: Within Functional Limits for tasks assessed  Exercises      General Comments        Pertinent Vitals/Pain Pain Assessment: Faces Faces Pain Scale: Hurts worst Pain Location: crying after gait due to pain Pain Descriptors / Indicators: Crying Pain Intervention(s): Limited activity within patient's tolerance;Monitored during session;Repositioned    Home Living                      Prior Function            PT Goals (current goals can now be found in the care plan section) Progress towards PT goals: Progressing toward goals    Frequency     7X/week      PT Plan Current plan remains appropriate    Co-evaluation              AM-PAC PT "6 Clicks" Mobility   Outcome Measure  Help needed turning from your back to your side while in a flat bed without using bedrails?: None Help needed moving from lying on your back to sitting on the side of a flat bed without using bedrails?: None Help needed moving to and from a bed to a chair (including a wheelchair)?: A Little Help needed standing up from a chair using your arms (e.g., wheelchair or bedside chair)?: A Little Help needed to walk in hospital room?: A Lot Help needed climbing 3-5 steps with a railing? : Total 6 Click Score: 17    End of Session Equipment Utilized During Treatment: Gait belt Activity Tolerance: Patient limited by fatigue;Patient limited by pain Patient left: with call bell/phone within reach;in bed;with bed alarm set Nurse Communication: Mobility status;Other (comment)       Time: 1130-1150 PT Time Calculation (min) (ACUTE ONLY): 20 min  Charges:  $Gait Training: 8-22 mins                    Chesley Noon, PTA 10/22/18, 12:25 PM

## 2018-10-22 NOTE — Progress Notes (Signed)
Patient ID: Shawn [name], male   DOB: April 22, 1986, 33 y.o.   MRN: [id number]  [facility] PROGRESS NOTE  Shawn [name] [id number] DOB: [birth year] DOA: 10/18/2018 PCP: Valera Castle, MD  HPI/Subjective: Complains of some nausea and pain   Objective: Vitals:   10/22/18 0449 10/22/18 1158  BP: (!) 147/98 (!) 132/95  Pulse: (!) 123 (!) 102  Resp: 20 17  Temp: 98.4 F (36.9 C) 97.6 F (36.4 C)  SpO2: 96% 91%    Filed Weights   10/18/18 0916 10/19/18 0500  Weight: 52.7 kg 52.9 kg    ROS: Review of Systems  Constitutional: Negative for chills and fever.  Eyes: Negative for blurred vision.  Respiratory: Negative for cough and shortness of breath.   Cardiovascular: Negative for chest pain.  Gastrointestinal: Positive for constipation. Negative for abdominal pain, diarrhea, nausea and vomiting.  Genitourinary: Negative for dysuria.  Musculoskeletal: Positive for joint pain.  Neurological: Negative for dizziness and headaches.   Exam: Physical Exam  Constitutional: He is oriented to person, place, and time.  HENT:  Nose: No mucosal edema.  Mouth/Throat: No oropharyngeal exudate or posterior oropharyngeal edema.  Eyes: Pupils are equal, round, and reactive to light. Conjunctivae, EOM and lids are normal.  Neck: No JVD present. Carotid bruit is not present. No edema present. No thyroid mass and no thyromegaly present.  Cardiovascular: S1 normal and S2 normal. Tachycardia present. Exam reveals no gallop.  No murmur heard. Pulses:      Dorsalis pedis pulses are 2+ on the left side.  Respiratory: No respiratory distress. He has no wheezes. He has no rhonchi. He has no rales.  GI: Soft. Bowel sounds are normal. There is no abdominal tenderness.  Musculoskeletal:     Right shoulder: He exhibits no swelling.  Lymphadenopathy:    He has no cervical adenopathy.  Neurological: He is alert and oriented to person, place, and time. No cranial nerve deficit.  Skin:  Skin is warm. Nails show no clubbing.  Right lower extremity amputation site covered in pressure dressing.  Left foot prior amputation site healed well.  Psychiatric: He has a normal mood and affect.      Data Reviewed: Basic Metabolic Panel: Recent Labs  Lab 10/18/18 0857 10/19/18 0345 10/20/18 0559 10/21/18 0440 10/22/18 0531  NA 135 135 135 133* 131*  K 3.6 3.6 3.6 4.0 4.6  CL 107 106 106 103 101  CO2 20* 21* 21* 23 21*  GLUCOSE 351* 231* 81 109* 109*  BUN 24* 30* 39* 55* 66*  CREATININE 2.21* 2.36* 2.03* 2.78* 2.83*  CALCIUM 8.2* 7.8* 8.2* 7.8* 8.1*  MG  --   --  1.7 1.9 2.2   CBC: Recent Labs  Lab 10/18/18 0857 10/19/18 0345 10/19/18 1830 10/20/18 0559 10/21/18 0440 10/22/18 0531  WBC 10.6* 13.5*  --  14.3* 14.1* 15.3*  NEUTROABS 7.8*  --   --   --   --   --   HGB 8.5* 6.5* 8.1* 8.3* 6.6* 9.4*  HCT 26.1* 20.8* 24.9* 26.3* 21.2* 28.9*  MCV 84.5 87.0  --  86.2 86.5 86.3  PLT 345 300  --  255 224 252    CBG: Recent Labs  Lab 10/21/18 1813 10/21/18 2109 10/22/18 0751 10/22/18 0925 10/22/18 1146  GLUCAP 133* 241* 89 123* 141*    Scheduled Meds: . calcium carbonate  500 mg Oral BID WC  . docusate sodium  100 mg Oral BID  . enoxaparin (LOVENOX) injection  30 mg  Subcutaneous Q24H  . feeding supplement (GLUCERNA SHAKE)  237 mL Oral TID BM  . ferrous sulfate  325 mg Oral BID WC  . gabapentin  300 mg Oral BID  . insulin aspart  0-5 Units Subcutaneous QHS  . insulin aspart  0-9 Units Subcutaneous TID WC  . insulin aspart  4 Units Subcutaneous TID WC  . insulin glargine  25 Units Subcutaneous BID  . multivitamin with minerals  1 tablet Oral Daily  . polyethylene glycol  17 g Oral Daily  . vitamin B-12  5,000 mcg Oral Daily  . ascorbic acid  250 mg Oral BID  . zolpidem  10 mg Oral QHS   Continuous Infusions:   Assessment/Plan:  1. Postoperative anemia.  Hemoglobin stable now status post transfusion 2. 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  Patient had right leg gangrene which was the reason for amputation.  Continue oral and IV pain medications  3. uncontrolled type 2 diabetes mellitus labile control continue insulin  4. chronic kidney disease stage III.  Monitor closely. 5. Diabetic neuropathy on gabapentin 6. Constipation start MiraLAX. 7. Physical therapy evaluation needed.  Patient will likely be mostly wheelchair-bound but need to see if he can use crutches and balance on his other foot.  Code Status:     Code Status Orders  (From admission, onward)         Start     Ordered   10/18/18 1237  Full code  Continuous     10/18/18 1242        Code Status History    Date Active Date Inactive Code Status Order ID Comments User Context   10/11/2018 1157 10/13/2018 1654 Full Code [id number]  Hillary Bow, MD Inpatient   09/19/2018 2049 09/21/2018 1851 Full Code [id number]  Saundra Shelling, MD Inpatient   08/16/2018 1203 08/18/2018 2142 Full Code [id number]  Hillary Bow, MD ED   07/16/2018 1739 07/18/2018 0104 Full Code [id number]  Dustin Flock, MD Inpatient   07/10/2018 0111 07/11/2018 1955 Full Code [id number]  Salary, Avel Peace, MD Inpatient   06/14/2018 1744 06/20/2018 2123 Full Code [id number]  Loletha Grayer, MD ED   05/28/2018 0437 05/28/2018 1926 Full Code [id number]  Arta Silence, MD ED   04/27/2018 1726 05/02/2018 1713 Full Code [id number]  Henreitta Leber, MD Inpatient   04/05/2018 1724 04/11/2018 2142 Full Code [id number]  Dustin Flock, MD Inpatient   02/06/2018 2022 02/07/2018 1512 Full Code [id number]  Demetrios Loll, MD Inpatient   01/10/2018 2259 01/11/2018 1947 Full Code [id number]  Amelia Jo, MD Inpatient   12/30/2017 0019 01/03/2018 1457 Full Code [id number]  Harrie Foreman, MD Inpatient   12/20/2017 1541 12/27/2017 2147 Full Code [id number]  Sharlotte Alamo, [facility] Inpatient   11/10/2017 0049 11/11/2017 1632 Full Code [id number]  Amelia Jo, MD Inpatient   07/13/2017 2112 07/15/2017 1730 Full Code  [id number]  Jules Husbands, MD ED      Disposition Plan: To be determined  Consultants:  Vascular surgery  Procedures:  Right leg below-knee amputation  Time spent: 5 minutes  Isabella

## 2018-10-22 NOTE — Progress Notes (Signed)
4 Days Post-Op   Subjective/Chief Complaint: Still notes pain in right BKA stump. Feels fatigued. Some loose stools this morning.   Objective: Vital signs in last 24 hours: Temp:  [98.4 F (36.9 C)-100.1 F (37.8 C)] 98.4 F (36.9 C) (04/19 0449) Pulse Rate:  [104-123] 123 (04/19 0449) Resp:  [12-20] 20 (04/19 0449) BP: (116-147)/(78-98) 147/98 (04/19 0449) SpO2:  [91 %-100 %] 96 % (04/19 0449) Last BM Date: 10/21/18  Intake/Output from previous day: 04/18 0701 - 04/19 0700 In: 1259.6 [P.O.:600; Blood:659.6] Out: 2275 [Urine:2275] Intake/Output this shift: No intake/output data recorded.  General appearance: alert and no distress Cardio: regular rate and rhythm Extremities: Stump- incision- C/D/I, soft, pink  Lab Results:  Recent Labs    10/21/18 0440 10/22/18 0531  WBC 14.1* 15.3*  HGB 6.6* 9.4*  HCT 21.2* 28.9*  PLT 224 252   BMET Recent Labs    10/21/18 0440 10/22/18 0531  NA 133* 131*  K 4.0 4.6  CL 103 101  CO2 23 21*  GLUCOSE 109* 109*  BUN 55* 66*  CREATININE 2.78* 2.83*  CALCIUM 7.8* 8.1*   PT/INR No results for input(s): LABPROT, INR in the last 72 hours. ABG No results for input(s): PHART, HCO3 in the last 72 hours.  Invalid input(s): PCO2, PO2  Studies/Results: No results found.  Anti-infectives: Anti-infectives (From admission, onward)   Start     Dose/Rate Route Frequency Ordered Stop   10/18/18 1830  clindamycin (CLEOCIN) IVPB 300 mg     300 mg 100 mL/hr over 30 Minutes Intravenous Every 8 hours 10/18/18 1242 10/19/18 1831   10/18/18 0930  clindamycin (CLEOCIN) 300 MG/50ML IVPB    Note to Pharmacy:  Lyman Bishop   : cabinet override      10/18/18 0930 10/18/18 1050   10/18/18 0600  clindamycin (CLEOCIN) IVPB 300 mg     300 mg 100 mL/hr over 30 Minutes Intravenous On call to O.R. 10/17/18 2139 10/18/18 1120      Assessment/Plan: s/p Procedure(s): AMPUTATION BELOW KNEE (Right) Continue OOB with PT  Increasing Cr-  monitor, review meds HgB stable s/p transfusion. Diabetes control. Rehab OK when medically ready. Ok from Vascular standpoint to D/C to rehab.  LOS: 4 days    Evaristo Bury 10/22/2018

## 2018-10-23 ENCOUNTER — Inpatient Hospital Stay: Payer: Medicaid Other

## 2018-10-23 DIAGNOSIS — Z8631 Personal history of diabetic foot ulcer: Secondary | ICD-10-CM

## 2018-10-23 DIAGNOSIS — Z91013 Allergy to seafood: Secondary | ICD-10-CM

## 2018-10-23 DIAGNOSIS — Z8614 Personal history of Methicillin resistant Staphylococcus aureus infection: Secondary | ICD-10-CM

## 2018-10-23 DIAGNOSIS — R509 Fever, unspecified: Secondary | ICD-10-CM

## 2018-10-23 DIAGNOSIS — N179 Acute kidney failure, unspecified: Secondary | ICD-10-CM

## 2018-10-23 DIAGNOSIS — J181 Lobar pneumonia, unspecified organism: Secondary | ICD-10-CM

## 2018-10-23 DIAGNOSIS — Z89422 Acquired absence of other left toe(s): Secondary | ICD-10-CM

## 2018-10-23 DIAGNOSIS — Z89511 Acquired absence of right leg below knee: Secondary | ICD-10-CM

## 2018-10-23 DIAGNOSIS — N189 Chronic kidney disease, unspecified: Secondary | ICD-10-CM

## 2018-10-23 DIAGNOSIS — Z91018 Allergy to other foods: Secondary | ICD-10-CM

## 2018-10-23 DIAGNOSIS — E1122 Type 2 diabetes mellitus with diabetic chronic kidney disease: Secondary | ICD-10-CM

## 2018-10-23 DIAGNOSIS — Z881 Allergy status to other antibiotic agents status: Secondary | ICD-10-CM

## 2018-10-23 DIAGNOSIS — Z794 Long term (current) use of insulin: Secondary | ICD-10-CM

## 2018-10-23 LAB — BASIC METABOLIC PANEL
Anion gap: 8 (ref 5–15)
BUN: 73 mg/dL — ABNORMAL HIGH (ref 6–20)
CO2: 22 mmol/L (ref 22–32)
Calcium: 8.3 mg/dL — ABNORMAL LOW (ref 8.9–10.3)
Chloride: 108 mmol/L (ref 98–111)
Creatinine, Ser: 3.26 mg/dL — ABNORMAL HIGH (ref 0.61–1.24)
GFR calc Af Amer: 28 mL/min — ABNORMAL LOW (ref >60)
GFR calc non Af Amer: 24 mL/min — ABNORMAL LOW (ref >60)
Glucose, Bld: 224 mg/dL — ABNORMAL HIGH (ref 70–99)
Potassium: 5.1 mmol/L (ref 3.5–5.1)
Sodium: 138 mmol/L (ref 135–145)

## 2018-10-23 LAB — MRSA PCR SCREENING: MRSA by PCR: NEGATIVE

## 2018-10-23 LAB — TYPE AND SCREEN
ABO/RH(D): A NEG
Antibody Screen: NEGATIVE

## 2018-10-23 LAB — MAGNESIUM: Magnesium: 2.1 mg/dL (ref 1.7–2.4)

## 2018-10-23 LAB — CBC
HCT: 31.6 % — ABNORMAL LOW (ref 39.0–52.0)
Hemoglobin: 9.8 g/dL — ABNORMAL LOW (ref 13.0–17.0)
MCH: 27.5 pg (ref 26.0–34.0)
MCHC: 31 g/dL (ref 30.0–36.0)
MCV: 88.5 fL (ref 80.0–100.0)
Platelets: 311 10*3/uL (ref 150–400)
RBC: 3.57 MIL/uL — ABNORMAL LOW (ref 4.22–5.81)
RDW: 13.7 % (ref 11.5–15.5)
WBC: 10.9 10*3/uL — ABNORMAL HIGH (ref 4.0–10.5)
nRBC: 0 % (ref 0.0–0.2)

## 2018-10-23 LAB — PROTIME-INR
INR: 1 (ref 0.8–1.2)
Prothrombin Time: 12.7 seconds (ref 11.4–15.2)

## 2018-10-23 LAB — SARS CORONAVIRUS 2 BY RT PCR (HOSPITAL ORDER, PERFORMED IN ~~LOC~~ HOSPITAL LAB): SARS Coronavirus 2: NEGATIVE

## 2018-10-23 LAB — GLUCOSE, CAPILLARY
Glucose-Capillary: 191 mg/dL — ABNORMAL HIGH (ref 70–99)
Glucose-Capillary: 253 mg/dL — ABNORMAL HIGH (ref 70–99)
Glucose-Capillary: 190 mg/dL — ABNORMAL HIGH (ref 70–99)
Glucose-Capillary: 200 mg/dL — ABNORMAL HIGH (ref 70–99)
Glucose-Capillary: 182 mg/dL — ABNORMAL HIGH (ref 70–99)
Glucose-Capillary: 120 mg/dL — ABNORMAL HIGH (ref 70–99)

## 2018-10-23 LAB — LACTIC ACID, PLASMA
Lactic Acid, Venous: 1.3 mmol/L (ref 0.5–1.9)
Lactic Acid, Venous: 1.2 mmol/L (ref 0.5–1.9)

## 2018-10-23 LAB — PROCALCITONIN: Procalcitonin: 3.33 ng/mL

## 2018-10-23 LAB — APTT: aPTT: 38 seconds — ABNORMAL HIGH (ref 24–36)

## 2018-10-23 MED ORDER — SODIUM CHLORIDE 0.9 % IV BOLUS: 500.0000 mL | Freq: Once | INTRAVENOUS | Status: DC

## 2018-10-23 MED ORDER — HYDROMORPHONE HCL 1 MG/ML IJ SOLN
1.0000 mg | INTRAMUSCULAR | Status: DC | PRN
  Administered 2018-10-23 – 2018-10-26 (×9): 1 mg via INTRAVENOUS
  Filled 2018-10-23 (×9): qty 1

## 2018-10-23 MED ORDER — SODIUM CHLORIDE 0.9 % IV SOLN: 2.0000 g | Freq: Once | INTRAVENOUS | Status: DC

## 2018-10-23 MED ORDER — IPRATROPIUM-ALBUTEROL 0.5-2.5 (3) MG/3ML IN SOLN: 3.0000 mL | Freq: Four times a day (QID) | RESPIRATORY_TRACT | Status: DC | PRN

## 2018-10-23 MED ORDER — PIPERACILLIN-TAZOBACTAM 3.375 G IVPB
3.3750 g | Freq: Three times a day (TID) | INTRAVENOUS | Status: DC
  Administered 2018-10-23 – 2018-10-24 (×4): 3.375 g via INTRAVENOUS
  Filled 2018-10-23 (×5): qty 50

## 2018-10-23 MED ORDER — SODIUM CHLORIDE 0.9 % IV SOLN
500.0000 mg | INTRAVENOUS | Status: DC
  Administered 2018-10-23: 23:00:00 500 mg via INTRAVENOUS
  Filled 2018-10-23: qty 500

## 2018-10-23 MED ORDER — FUROSEMIDE 10 MG/ML IJ SOLN
40.0000 mg | INTRAMUSCULAR | Status: AC
  Administered 2018-10-23: 23:00:00 40 mg via INTRAVENOUS
  Filled 2018-10-23: qty 4

## 2018-10-23 MED ORDER — SODIUM CHLORIDE 0.9 % IV SOLN
INTRAVENOUS | Status: DC | PRN
  Administered 2018-10-23 – 2018-10-30 (×3): 250 mL via INTRAVENOUS

## 2018-10-23 MED ORDER — FUROSEMIDE 10 MG/ML IJ SOLN: 20.0000 mg | Freq: Once | INTRAMUSCULAR | Status: DC

## 2018-10-23 MED ORDER — METOPROLOL TARTRATE 25 MG PO TABS
12.5000 mg | ORAL_TABLET | Freq: Two times a day (BID) | ORAL | Status: DC
  Administered 2018-10-23 – 2018-10-26 (×7): 12.5 mg via ORAL
  Filled 2018-10-23 (×7): qty 1

## 2018-10-23 MED ORDER — VANCOMYCIN HCL 10 G IV SOLR
1500.0000 mg | Freq: Once | INTRAVENOUS | Status: AC
  Administered 2018-10-23: 1500 mg via INTRAVENOUS
  Filled 2018-10-23: qty 1500

## 2018-10-23 MED ORDER — VANCOMYCIN VARIABLE DOSE PER UNSTABLE RENAL FUNCTION (PHARMACIST DOSING): Status: DC

## 2018-10-23 MED ORDER — HYDROMORPHONE HCL 1 MG/ML IJ SOLN
0.5000 mg | INTRAMUSCULAR | Status: DC | PRN
  Administered 2018-10-23 (×2): 0.5 mg via INTRAVENOUS
  Filled 2018-10-23 (×2): qty 0.5

## 2018-10-23 MED ORDER — SODIUM CHLORIDE 0.9 % IV SOLN
INTRAVENOUS | Status: DC
  Administered 2018-10-23: 09:00:00 via INTRAVENOUS

## 2018-10-23 NOTE — Progress Notes (Signed)
PT Cancellation Note  Patient Details Name: Shawn [name] MRN: [id number] DOB: [birth year]   Cancelled Treatment:    Reason Eval/Treat Not Completed: Other (comment).  Pt requesting to be seen after pain meds (due around noon per nurse) d/t 8/10 residual limb pain (nurse notified).  Will re-attempt PT session later as able.  Leitha Bleak, PT 10/23/18, 11:29 AM [phone]

## 2018-10-23 NOTE — Progress Notes (Signed)
After pt was up to [facility] to void, his vital signs were taken, elevated temp, elevated heart rate and hypoxia, pt alert -responsive, denies pain other than his surgery site BKA, non re breather placed, RT present, Dr Marcille Blanco notified and orders received, pt aware of new orders.

## 2018-10-23 NOTE — Progress Notes (Signed)
[facility]  Daily Progress Note   Subjective  - 5 Days Post-Op  Patient with increasing respiratory distress.  Continues to complain of pain at the amputation site  Objective Vitals:   10/23/18 1323 10/23/18 1522 10/23/18 1524 10/23/18 1526  BP:  (!) 148/84 132/82   Pulse:  (!) 114 (!) 115   Resp:      Temp:  99.2 F (37.3 C)    TempSrc:  Oral    SpO2: 92% (!) 85% (!) 82% 90%  Weight:      Height:        Intake/Output Summary (Last 24 hours) at 10/23/2018 1742 Last data filed at 10/23/2018 1500 Gross per 24 hour  Intake 1964.95 ml  Output 700 ml  Net 1264.95 ml    PULM  no audible wheezing CV  tachycardic VASC  incision is clean dry and intact right below-knee amputation stump  Laboratory CBC    Component Value Date/Time   WBC 10.9 (H) 10/23/2018 0528   HGB 9.8 (L) 10/23/2018 0528   HGB 16.4 10/06/2014 0539   HCT 31.6 (L) 10/23/2018 0528   HCT 47.4 10/06/2014 0539   PLT 311 10/23/2018 0528   PLT 250 10/06/2014 0539    BMET    Component Value Date/Time   NA 138 10/23/2018 0528   NA 128 (L) 08/14/2018 1055   NA 132 (L) 10/06/2014 0539   K 5.1 10/23/2018 0528   K 3.4 (L) 10/06/2014 0539   CL 108 10/23/2018 0528   CL 91 (L) 10/06/2014 0539   CO2 22 10/23/2018 0528   CO2 26 10/06/2014 0539   GLUCOSE 224 (H) 10/23/2018 0528   GLUCOSE 634 (HH) 10/06/2014 0539   BUN 73 (H) 10/23/2018 0528   BUN 25 (H) 08/14/2018 1055   BUN 16 10/06/2014 0539   CREATININE 3.26 (H) 10/23/2018 0528   CREATININE 1.04 10/06/2014 0539   CALCIUM 8.3 (L) 10/23/2018 0528   CALCIUM 10.0 10/06/2014 0539   GFRNONAA 24 (L) 10/23/2018 0528   GFRNONAA >60 10/06/2014 0539   GFRAA 28 (L) 10/23/2018 0528   GFRAA >60 10/06/2014 0539    Assessment/Planning: POD #5 s/p right below-knee amputation   The surgery site appears to be healing nicely.  He appears to have developed a significant pneumonia postoperatively.  He is now demanding maximal O2 by nasal  cannula.  He has not been placed on broad-spectrum antibiotics.  I concur with transferring to the intensive care unit.  Hortencia Pilar  10/23/2018, 5:42 PM

## 2018-10-23 NOTE — Discharge Summary (Signed)
[facility] [place] NAME: Shawn [name]    MR#:  [id number]  DATE OF BIRTH:  October 25, 1985  DATE OF ADMISSION:  10/11/2018 ADMITTING PHYSICIAN: Katha Cabal, MD  DATE OF DISCHARGE: 10/13/2018  1:49 PM  PRIMARY CARE PHYSICIAN: Valera Castle, MD   ADMISSION DIAGNOSIS:  RT lower extremity angio   ASO w ulceration SHELLFISH ALLERGY  DISCHARGE DIAGNOSIS:  Active Problems:   Hyperglycemia   SECONDARY DIAGNOSIS:   Past Medical History:  Diagnosis Date  . CKD (chronic kidney disease)   . Diabetes mellitus without complication ([place])   . GERD (gastroesophageal reflux disease)   . HTN (hypertension)   . IBS (irritable bowel syndrome)   . Osteomyelitis ([place])      ADMITTING HISTORY  HISTORY OF PRESENT ILLNESS:  Shawn [name]  is a 33 y.o. male with a known history of insulin-dependent diabetes mellitus with uncontrolled blood sugars, CKD stage III, chronic nausea vomiting, chronic right foot ulcers was in specials area for right lower extremity angiogram and found to have blood sugars greater than 600.  Patient was given 2 doses of NovoLog 10 units and blood sugars still high and hospitalist team consulted.  Patient has not taken his Lantus 35 units in the morning.  Also his blood sugars have been running in the 300s.  He sees endocrinology [name].  He has an appointment to follow-up next month. He is on 35 units twice daily Lantus at home and NovoLog 3 units pre-meal.  He also complains of chronic nausea, diarrhea, pain   HOSPITAL COURSE:   *Uncontrolled diabetes mellitus with hyperglycemia.  Admitted to medical floor and started on Lantus and sliding scale insulin.  Seen by diabetes coordinator.  Diabetic diet.  Patient was counseled to control his carbs which was the main issue for high per glycemia at home.  Blood sugars are much improved.  Follow-up with endocrinology as outpatient  *Chronic right foot ulcers and  evaluation for peripheral arterial disease. Angiogram with similar findings and did not need intervention but small vessel disease. Podiatry consulted and patient had debridement Discussed with vascular surgery and podiatry.  Patient will need right below-knee amputation soon.  This is scheduled for next week and patient wants to go home and return for this procedure.  He has oral antibiotics at home that he will continue.   *CKD stage III is stable  *Chronic nausea and pain. Continue home medications.  DVT prophylaxis. Renally dosed Lovenox  Patient discharged home in stable condition.  CONSULTS OBTAINED:    DRUG ALLERGIES:   Allergies  Allergen Reactions  . Banana Hives, Nausea And Vomiting and Rash  . Keflex [Cephalexin] Rash    No swelling- also taken penicillin without any issue.  . Sulfa Antibiotics Anaphylaxis  . Grapeseed Extract [Nutritional Supplements] Itching  . Shellfish Allergy Hives    "ALL SEAFOOD"  . Grape Seed Rash    DISCHARGE MEDICATIONS:   Allergies as of 10/13/2018      Reactions   Banana Hives, Nausea And Vomiting, Rash   Keflex [cephalexin] Rash   No swelling- also taken penicillin without any issue.   Sulfa Antibiotics Anaphylaxis   Grapeseed Extract [nutritional Supplements] Itching   Shellfish Allergy Hives   "ALL SEAFOOD"   Grape Seed Rash      Medication List    STOP taking these medications   lisinopril 10 MG tablet Commonly known as:  ZESTRIL   oxyCODONE-acetaminophen 10-325 MG tablet Commonly  known as:  PERCOCET     TAKE these medications   ALPRAZolam 0.25 MG tablet Commonly known as:  XANAX Take 1 tablet (0.25 mg total) by mouth 3 (three) times daily as needed for anxiety.   amoxicillin-clavulanate 875-125 MG tablet Commonly known as:  Augmentin Take 1 tablet by mouth 2 (two) times daily.   ascorbic acid 250 MG tablet Commonly known as:  VITAMIN C Take 1 tablet (250 mg total) by mouth 2 (two) times daily.    calcium carbonate 600 MG Tabs tablet Commonly known as:  Calcium 600 Take 1 tablet (600 mg total) by mouth 2 (two) times daily with a meal for 30 days.   diphenoxylate-atropine 2.5-0.025 MG tablet Commonly known as:  LOMOTIL Take 1 tablet by mouth 4 (four) times daily as needed for diarrhea or loose stools.   ergocalciferol 1.25 MG (50000 UT) capsule Commonly known as:  VITAMIN D2 Take 1 capsule (50,000 Units total) by mouth 2 (two) times a week. X 6 weeks. What changed:  additional instructions   ferrous sulfate 325 (65 FE) MG tablet Take 1 tablet (325 mg total) by mouth 2 (two) times daily with a meal.   gabapentin 300 MG capsule Commonly known as:  NEURONTIN Take 1 capsule (300 mg total) by mouth 2 (two) times daily.   insulin aspart 100 UNIT/ML injection Commonly known as:  novoLOG Inject 4 Units into the skin 3 (three) times daily with meals.   insulin glargine 100 UNIT/ML injection Commonly known as:  LANTUS Inject 0.3 mLs (30 Units total) into the skin daily. What changed:    how much to take  when to take this   Insulin Syringes (Disposable) U-100 0.3 ML Misc 1 Syringe by Does not apply route 4 (four) times daily -  with meals and at bedtime.   levofloxacin 750 MG tablet Commonly known as:  Levaquin Take 1 tablet (750 mg total) by mouth daily.   promethazine 25 MG tablet Commonly known as:  PHENERGAN Take 1 tablet (25 mg total) by mouth every 6 (six) hours as needed for nausea or vomiting.   Vitamin B-12 5000 MCG Subl Place 1 tablet (5,000 mcg total) under the tongue daily.   Vitamin D3 125 MCG (5000 UT) Caps Take 1 capsule (5,000 Units total) by mouth daily with breakfast. Take along with calcium and magnesium.   zolpidem 10 MG tablet Commonly known as:  AMBIEN Take 10 mg by mouth at bedtime.     ASK your doctor about these medications   oxyCODONE-acetaminophen 10-325 MG tablet Commonly known as:  PERCOCET Take 1 tablet by mouth every 6 (six) hours  as needed for up to 3 days for pain. Ask about: Should I take this medication?       Today   VITAL SIGNS:  Blood pressure (!) 142/91, pulse 91, temperature 97.6 F (36.4 C), temperature source Oral, resp. rate 17, height 5\' 6"  (1.676 m), weight 52.7 kg, SpO2 100 %.  I/O:  No intake or output data in the 24 hours ending 10/23/18 1235  PHYSICAL EXAMINATION:  Physical Exam  GENERAL:  33 y.o.-year-old patient lying in the bed with no acute distress.  LUNGS: Normal breath sounds bilaterally, no wheezing, rales,rhonchi or crepitation. No use of accessory muscles of respiration.  CARDIOVASCULAR: S1, S2 normal. No murmurs, rubs, or gallops.  ABDOMEN: Soft, non-tender, non-distended. Bowel sounds present. No organomegaly or mass.  NEUROLOGIC: Moves all 4 extremities. PSYCHIATRIC: The patient is alert and oriented x 3.  SKIN: No obvious rash, lesion, or ulcer.   DATA REVIEW:   CBC Recent Labs  Lab 10/23/18 0528  WBC 10.9*  HGB 9.8*  HCT 31.6*  PLT 311    Chemistries  Recent Labs  Lab 10/23/18 0528  NA 138  K 5.1  CL 108  CO2 22  GLUCOSE 224*  BUN 73*  CREATININE 3.26*  CALCIUM 8.3*  MG 2.1    Cardiac Enzymes No results for input(s): TROPONINI in the last 168 hours.  Microbiology Results  Results for orders placed or performed during the hospital encounter of 09/19/18  MRSA PCR Screening     Status: None   Collection Time: 09/20/18 10:21 AM  Result Value Ref Range Status   MRSA by PCR NEGATIVE NEGATIVE Final    Comment:        The GeneXpert MRSA Assay (FDA approved for NASAL specimens only), is one component of a comprehensive MRSA colonization surveillance program. It is not intended to diagnose MRSA infection nor to guide or monitor treatment for MRSA infections. Performed at [facility], [address] [place], [place] [address]   Aerobic/Anaerobic Culture (surgical/deep wound)     Status: None   Collection Time: 09/20/18  5:52 PM   Result Value Ref Range Status   Specimen Description   Final    WOUND RIGHT FOOT Performed at [facility], [address], [place], [place] [address]    Special Requests   Final    NONE Performed at [facility], [address] [place], [place] [address]    Gram Stain   Final    RARE WBC PRESENT, PREDOMINANTLY PMN RARE GRAM POSITIVE COCCI IN PAIRS    Culture   Final    FEW ENTEROBACTER CLOACAE RARE STAPHYLOCOCCUS AUREUS NO ANAEROBES ISOLATED Performed at [facility], [place] [address], [place], [place] [address]    Report Status 09/25/2018 FINAL  Final   Organism ID, Bacteria ENTEROBACTER CLOACAE  Final   Organism ID, Bacteria STAPHYLOCOCCUS AUREUS  Final      Susceptibility   Enterobacter cloacae - MIC*    CEFAZOLIN >=64 RESISTANT Resistant     CEFEPIME <=1 SENSITIVE Sensitive     CEFTAZIDIME <=1 SENSITIVE Sensitive     CEFTRIAXONE <=1 SENSITIVE Sensitive     CIPROFLOXACIN <=0.25 SENSITIVE Sensitive     GENTAMICIN <=1 SENSITIVE Sensitive     IMIPENEM 0.5 SENSITIVE Sensitive     TRIMETH/SULFA <=20 SENSITIVE Sensitive     PIP/TAZO <=4 SENSITIVE Sensitive     * FEW ENTEROBACTER CLOACAE   Staphylococcus aureus - MIC*    CIPROFLOXACIN <=0.5 SENSITIVE Sensitive     ERYTHROMYCIN 0.5 SENSITIVE Sensitive     GENTAMICIN <=0.5 SENSITIVE Sensitive     OXACILLIN 0.5 SENSITIVE Sensitive     TETRACYCLINE <=1 SENSITIVE Sensitive     VANCOMYCIN 1 SENSITIVE Sensitive     TRIMETH/SULFA <=10 SENSITIVE Sensitive     CLINDAMYCIN <=0.25 SENSITIVE Sensitive     RIFAMPIN <=0.5 SENSITIVE Sensitive     Inducible Clindamycin NEGATIVE Sensitive     * RARE STAPHYLOCOCCUS AUREUS    RADIOLOGY:  Dg Chest Port 1 View  Result Date: 10/23/2018 CLINICAL DATA:  33 year old male with cough. Recent below the knee right lower extremity amputation for foot gangrene. EXAM: PORTABLE CHEST 1 VIEW COMPARISON:  09/03/2018 and earlier. FINDINGS: Portable AP upright view at 0616 hours.  Patchy and indistinct bilateral pulmonary opacity with mid and lower lung predominance is new since March.  Mildly lower lung volumes. Mediastinal contours remain normal. Visualized tracheal air column is within normal limits. No pneumothorax or pleural effusion. Mild to moderate gas and fluid distension of the stomach. Negative splenic flexure. No acute osseous abnormality identified. IMPRESSION: Patchy and indistinct bilateral pulmonary opacity most suggestive of bilateral bronchopneumonia. No pleural effusion. Electronically Signed   By: Genevie Ann M.D.   On: 10/23/2018 06:33    Follow up with PCP in 1 week.  Management plans discussed with the patient, family and they are in agreement.  CODE STATUS:  Code Status History    Date Active Date Inactive Code Status Order ID Comments User Context   10/11/2018 1157 10/13/2018 1654 Full Code [id number]  Hillary Bow, MD Inpatient   09/19/2018 2049 09/21/2018 1851 Full Code [id number]  Saundra Shelling, MD Inpatient   08/16/2018 1203 08/18/2018 2142 Full Code [id number]  Hillary Bow, MD ED   07/16/2018 1739 07/18/2018 0104 Full Code [id number]  Dustin Flock, MD Inpatient   07/10/2018 0111 07/11/2018 1955 Full Code [id number]  Salary, Avel Peace, MD Inpatient   06/14/2018 1744 06/20/2018 2123 Full Code [id number]  Loletha Grayer, MD ED   05/28/2018 0437 05/28/2018 1926 Full Code [id number]  Arta Silence, MD ED   04/27/2018 1726 05/02/2018 1713 Full Code [id number]  Henreitta Leber, MD Inpatient   04/05/2018 1724 04/11/2018 2142 Full Code [id number]  Dustin Flock, MD Inpatient   02/06/2018 2022 02/07/2018 1512 Full Code [id number]  Demetrios Loll, MD Inpatient   01/10/2018 2259 01/11/2018 1947 Full Code [id number]  Amelia Jo, MD Inpatient   12/30/2017 0019 01/03/2018 1457 Full Code [id number]  Harrie Foreman, MD Inpatient   12/20/2017 1541 12/27/2017 2147 Full Code [id number]  Sharlotte Alamo, [facility] Inpatient   11/10/2017 0049 11/11/2017 1632 Full Code [id number]  Amelia Jo, MD  Inpatient   07/13/2017 2112 07/15/2017 1730 Full Code [id number]  Jules Husbands, MD ED      TOTAL TIME TAKING CARE OF THIS PATIENT ON DAY OF DISCHARGE: more than 30 minutes.   Leia Alf Tylie Golonka M.D on 10/23/2018 at 12:35 PM  Between 7am to 6pm - Pager - [phone]  After 6pm go to [email or web address] - password [email or web address] [facility]  Office  [phone]  CC: Primary care physician; Valera Castle, MD  Note: This dictation was prepared with Dragon dictation along with smaller phrase technology. Any transcriptional errors that result from this process are unintentional.

## 2018-10-23 NOTE — Progress Notes (Signed)
CODE SEPSIS - PHARMACY COMMUNICATION  **Broad Spectrum Antibiotics should be administered within 1 hour of Sepsis diagnosis**  Time Code Sepsis Called/Page Received: @ 0525  Antibiotics Ordered: Zosyn and Vancomycin   Time of 1st antibiotic administration: @ 0703  Additional action taken by pharmacy: Spoke with RN @ [phone] about time left to start Abxs  If necessary, Name of Provider/Nurse Contacted: Rosann Auerbach, RN  Pernell Dupre, PharmD, BCPS Clinical Pharmacist 10/23/2018 7:12 AM

## 2018-10-23 NOTE — Progress Notes (Signed)
PT Cancellation Note  Patient Details Name: Shawn [name] MRN: [id number] DOB: 06/10/1986   Cancelled Treatment:    Reason Eval/Treat Not Completed: Other (comment).  Pt had been titrated down to 5 L via HFNC when therapist checked on pt earlier but per discussion with pt's nurse, pt currently requiring 7 L HFNC in order to have O2 sats in 90's (nurse also reports pt has been getting up to [facility] with nursing staff).  Per chart review, pt appears to have been on room air yesterday.  D/t respiratory concerns and increase in O2 needs today (per chart review, pt with diagnosis of aspiration PNA), will hold PT at this time and re-attempt PT treatment at a later date/time.  Leitha Bleak, PT 10/23/18, 1:42 PM [phone]

## 2018-10-23 NOTE — Consult Note (Signed)
NAME: Shawn [name]  DOB: 20-Jan-1986  MRN: [id number]  Date/Time: 10/23/2018 9:50 PM  REQUESTING PROVIDER:patel Subjective:  REASON FOR CONSULT: fever ? Shawn [name] is a 33 y.o. male  with a history of IDDM, CKD, B/L  Diabetic foot infection ,  osteomyelitis s/p amputation of toes, h/o MRSA bacteremia well known to me from previous hospitalizations was admitted on 10/18/18 for rt BKA for gangrene rt foot. He underwent surgery on that day by Dr.Schnier and was having high blood glucose. He was seen by hospitalist service for management of DM. He was continuing to have pain at the surgical site and also phantom pain. Today he had a temp of 102.8 an dstarted to feel SOB- CXR showed multilobar pneumonia and he was started on vanco and zosyn and is being transferred to ICU for hypoxia- COVID test is negative. I am asked to see the patient fr the fever. WBC is 10.9, cr 3.26 Past Medical History:  Diagnosis Date  . CKD (chronic kidney disease)   . Diabetes mellitus without complication ([place])   . GERD (gastroesophageal reflux disease)   . HTN (hypertension)   . IBS (irritable bowel syndrome)   . Osteomyelitis [facility])     Past Surgical History:  Procedure Laterality Date  . ABDOMINAL AORTOGRAM W/LOWER EXTREMITY Right 12/23/2017   Procedure: ABDOMINAL AORTOGRAM W/LOWER EXTREMITY;  Surgeon: Katha Cabal, MD;  Location: [facility];  Service: Cardiovascular;  Laterality: Right;  . ACHILLES TENDON SURGERY Bilateral 06/16/2018   Procedure: ACHILLES LENGTHENING/KIDNER;  Surgeon: Albertine Patricia, DPM;  Location: [facility];  Service: Podiatry;  Laterality: Bilateral;  . AMPUTATION Right 12/24/2017   Procedure: AMPUTATION RAY;  Surgeon: Sharlotte Alamo, DPM;  Location: [facility];  Service: Podiatry;  Laterality: Right;  . AMPUTATION Left 04/06/2018   Procedure: AMPUTATION RAY;  Surgeon: Sharlotte Alamo, DPM;  Location: [facility];  Service: Podiatry;  Laterality: Left;  . AMPUTATION Left 04/09/2018   Procedure: AMPUTATION RAY;  Surgeon: Sharlotte Alamo, DPM;  Location: [facility];  Service: Podiatry;  Laterality: Left;  . AMPUTATION Right 10/18/2018   Procedure: AMPUTATION BELOW KNEE;  Surgeon: Katha Cabal, MD;  Location: [facility];  Service: Vascular;  Laterality: Right;  . APPLICATION OF WOUND VAC Right 12/24/2017   Procedure: APPLICATION OF WOUND VAC;  Surgeon: Sharlotte Alamo, DPM;  Location: [facility];  Service: Podiatry;  Laterality: Right;  . APPLICATION OF WOUND VAC Right 01/01/2018   Procedure: APPLICATION OF WOUND VAC;  Surgeon: Albertine Patricia, DPM;  Location: [facility];  Service: Podiatry;  Laterality: Right;  . BONE EXCISION Bilateral 06/16/2018   Procedure: BONE EXCISION AND SOFT TISSUE;  Surgeon: Albertine Patricia, DPM;  Location: [facility];  Service: Podiatry;  Laterality: Bilateral;  . IRRIGATION AND DEBRIDEMENT FOOT Right 12/20/2017   Procedure: IRRIGATION AND DEBRIDEMENT FOOT;  Surgeon: Sharlotte Alamo, DPM;  Location: [facility];  Service: Podiatry;  Laterality: Right;  . IRRIGATION AND DEBRIDEMENT FOOT Right 12/24/2017   Procedure: IRRIGATION AND DEBRIDEMENT FOOT;  Surgeon: Sharlotte Alamo, DPM;  Location: [facility];  Service: Podiatry;  Laterality: Right;  . IRRIGATION AND DEBRIDEMENT FOOT Right 01/01/2018   Procedure: IRRIGATION AND DEBRIDEMENT FOOT-SKIN,SOFT TISSUE AND BONE;  Surgeon: Albertine Patricia, DPM;  Location: [facility];  Service: Podiatry;  Laterality: Right;  . IRRIGATION AND DEBRIDEMENT FOOT Left 04/06/2018   Procedure: IRRIGATION AND DEBRIDEMENT FOOT;  Surgeon: Sharlotte Alamo, DPM;  Location: [facility];  Service: Podiatry;  Laterality: Left;  . IRRIGATION AND DEBRIDEMENT FOOT Right 09/20/2018  Procedure: IRRIGATION AND DEBRIDEMENT FOOT;  Surgeon: Sharlotte Alamo, DPM;  Location: [facility];  Service: Podiatry;  Laterality: Right;  . LOWER EXTREMITY ANGIOGRAPHY Left 04/06/2018   Procedure: Lower Extremity Angiography;  Surgeon: Algernon Huxley, MD;  Location: [facility];  Service:  Cardiovascular;  Laterality: Left;  . LOWER EXTREMITY ANGIOGRAPHY Bilateral 10/13/2018   Procedure: Lower Extremity Angiography;  Surgeon: Katha Cabal, MD;  Location: [facility];  Service: Cardiovascular;  Laterality: Bilateral;    SH Non smoker   Family History  Problem Relation Age of Onset  . Diabetes Mother   . Ovarian cancer Mother   . Healthy Father    Allergies  Allergen Reactions  . Banana Hives, Nausea And Vomiting and Rash  . Keflex [Cephalexin] Rash    No swelling- also taken penicillin without any issue.  . Sulfa Antibiotics Anaphylaxis  . Grapeseed Extract [Nutritional Supplements] Itching  . Shellfish Allergy Hives    "ALL SEAFOOD"  . Grape Seed Rash   ? Current Facility-Administered Medications  Medication Dose Route Frequency Provider Last Rate Last Dose  . 0.9 %  sodium chloride infusion   Intravenous PRN Harrie Foreman, MD 10 mL/hr at 10/23/18 [phone]    . 0.9 %  sodium chloride infusion   Intravenous Continuous Dustin Flock, MD 125 mL/hr at 10/23/18 1500    . acetaminophen (TYLENOL) tablet 650 mg  650 mg Oral Q6H PRN Schnier, Dolores Lory, MD   650 mg at 10/23/18 0703   Or  . acetaminophen (TYLENOL) suppository 650 mg  650 mg Rectal Q6H PRN Schnier, Dolores Lory, MD      . ALPRAZolam Duanne Moron) tablet 0.25 mg  0.25 mg Oral TID PRN Delana Meyer Dolores Lory, MD   0.25 mg at 10/23/18 1658  . alum & mag hydroxide-simeth (MAALOX/MYLANTA) 200-200-20 MG/5ML suspension 30 mL  30 mL Oral Q4H PRN Dustin Flock, MD   30 mL at 10/21/18 2029  . calcium carbonate (TUMS - dosed in mg elemental calcium) chewable tablet 500 mg  500 mg Oral BID WC Schnier, Dolores Lory, MD   500 mg at 10/23/18 1740  . diphenoxylate-atropine (LOMOTIL) 2.5-0.025 MG per tablet 1 tablet  1 tablet Oral QID PRN Schnier, Dolores Lory, MD   1 tablet at 10/22/18 1724  . enoxaparin (LOVENOX) injection 30 mg  30 mg Subcutaneous Q24H Hallaji, Sheema M, RPH   30 mg at 10/22/18 2114  . feeding supplement  (GLUCERNA SHAKE) (GLUCERNA SHAKE) liquid 237 mL  237 mL Oral TID BM Schnier, Dolores Lory, MD   237 mL at 10/19/18 1237  . ferrous sulfate tablet 325 mg  325 mg Oral BID WC Schnier, Dolores Lory, MD   325 mg at 10/23/18 7517  . gabapentin (NEURONTIN) capsule 300 mg  300 mg Oral BID Schnier, Dolores Lory, MD   300 mg at 10/23/18 0017  . HYDROmorphone (DILAUDID) injection 1 mg  1 mg Intravenous Q4H PRN Awilda Bill, NP   1 mg at 10/23/18 2040  . insulin aspart (novoLOG) injection 0-5 Units  0-5 Units Subcutaneous QHS Saundra Shelling, MD   2 Units at 10/21/18 2222  . insulin aspart (novoLOG) injection 0-9 Units  0-9 Units Subcutaneous TID WC Saundra Shelling, MD   2 Units at 10/23/18 1740  . insulin aspart (novoLOG) injection 4 Units  4 Units Subcutaneous TID WC Saundra Shelling, MD   4 Units at 10/23/18 1740  . insulin glargine (LANTUS) injection 25 Units  25 Units Subcutaneous BID  Loletha Grayer, MD   25 Units at 10/23/18 0813  . magnesium hydroxide (MILK OF MAGNESIA) suspension 30 mL  30 mL Oral Daily PRN Schnier, Dolores Lory, MD      . metoprolol tartrate (LOPRESSOR) tablet 12.5 mg  12.5 mg Oral BID Dustin Flock, MD   12.5 mg at 10/23/18 0856  . multivitamin with minerals tablet 1 tablet  1 tablet Oral Daily Schnier, Dolores Lory, MD   1 tablet at 10/23/18 [phone]  . oxyCODONE-acetaminophen (PERCOCET/ROXICET) 5-325 MG per tablet 1 tablet  1 tablet Oral Q4H PRN Loletha Grayer, MD   1 tablet at 10/23/18 1953   And  . oxyCODONE (Oxy IR/ROXICODONE) immediate release tablet 5 mg  5 mg Oral Q4H PRN Loletha Grayer, MD   5 mg at 10/23/18 1953  . piperacillin-tazobactam (ZOSYN) IVPB 3.375 g  3.375 g Intravenous Q8H Hallaji, Sheema M, RPH 12.5 mL/hr at 10/23/18 1500    . polyethylene glycol (MIRALAX / GLYCOLAX) packet 17 g  17 g Oral Daily Wieting, Richard, MD      . promethazine (PHENERGAN) injection 12.5 mg  12.5 mg Intravenous Q6H PRN Saundra Shelling, MD   12.5 mg at 10/23/18 2010  . vitamin B-12 (CYANOCOBALAMIN)  tablet 5,000 mcg  5,000 mcg Oral Daily Schnier, Dolores Lory, MD   5,000 mcg at 10/23/18 [phone]  . vitamin C (ASCORBIC ACID) tablet 250 mg  250 mg Oral BID Schnier, Dolores Lory, MD   250 mg at 10/23/18 0813  . zolpidem (AMBIEN) tablet 10 mg  10 mg Oral QHS Schnier, Dolores Lory, MD   10 mg at 10/22/18 2113     Abtx:  Anti-infectives (From admission, onward)   Start     Dose/Rate Route Frequency Ordered Stop   10/23/18 0700  vancomycin variable dose per unstable renal function (pharmacist dosing)  Status:  Discontinued      Does not apply See admin instructions 10/23/18 0700 10/23/18 1237   10/23/18 0630  piperacillin-tazobactam (ZOSYN) IVPB 3.375 g     3.375 g 12.5 mL/hr over 240 Minutes Intravenous Every 8 hours 10/23/18 0628     10/23/18 0615  vancomycin (VANCOCIN) 1,500 mg in sodium chloride 0.9 % 500 mL IVPB     1,500 mg 250 mL/hr over 120 Minutes Intravenous  Once 10/23/18 0614 10/23/18 0902   10/23/18 0615  ceFEPIme (MAXIPIME) 2 g in sodium chloride 0.9 % 100 mL IVPB  Status:  Discontinued     2 g 200 mL/hr over 30 Minutes Intravenous  Once 10/23/18 6967 10/23/18 0623   10/18/18 1830  clindamycin (CLEOCIN) IVPB 300 mg     300 mg 100 mL/hr over 30 Minutes Intravenous Every 8 hours 10/18/18 1242 10/19/18 1831   10/18/18 0930  clindamycin (CLEOCIN) 300 MG/50ML IVPB    Note to Pharmacy:  Lyman Bishop   : cabinet override      10/18/18 0930 10/18/18 1050   10/18/18 0600  clindamycin (CLEOCIN) IVPB 300 mg     300 mg 100 mL/hr over 30 Minutes Intravenous On call to O.R. 10/17/18 2139 10/18/18 1120      REVIEW OF SYSTEMS:  Const:  fever, negative chills, negative weight loss Eyes: negative diplopia or visual changes, negative eye pain ENT: negative coryza, negative sore throat Resp: negative cough, hemoptysis, has dyspnea Cards: negative for chest pain, palpitations, lower extremity edema GU: negative for frequency, dysuria and hematuria GI: Negative for abdominal pain, diarrhea,  bleeding, constipation Skin: negative for rash and pruritus Heme: negative for easy  bruising and gum/nose bleeding MS: has pain rt leg Neurolo:negative for headaches, dizziness, vertigo, memory problems  Psych:  anxiety, depression  Endocrine: poorly controlled DM Allergy/Immunology- negative for any medication or food allergies ? Pertinent Positives include : Objective:  VITALS:  BP (!) 137/91   Pulse (!) 123   Temp 98.8 F (37.1 C) (Oral)   Resp 16   Ht 5\' 6"  (1.676 m)   Wt 65.3 kg   SpO2 95%   BMI 23.24 kg/m  PHYSICAL EXAM:  General: Alert, cooperative, no distress, appears stated age. Pale, chronically ill Head: Normocephalic, without obvious abnormality, atraumatic. Eyes: Conjunctivae clear, anicteric sclerae. Pupils are equal ENT Nares normal. No drainage or sinus tenderness. Lips, mucosa, and tongue normal. No Thrush Neck: Supple, symmetrical, no adenopathy, thyroid: non tender no carotid bruit and no JVD. Back: No CVA tenderness. Lungs:b/l ai entry, creps Heart: s1s2 Abdomen: Soft, non-tender,not distended. Bowel sounds normal. No masses Extremities: left fott- 4th, 5th toe amputated- site healed well RT BKA- dressing not removed( Dr.Schnier/s note says it is clean and healing well)  no cyanosis. No edema. No clubbing Skin: No rashes or lesions. Or bruising Lymph: Cervical, supraclavicular normal. Neurologic: Grossly non-focal Pertinent Labs Lab Results CBC    Component Value Date/Time   WBC 10.9 (H) 10/23/2018 0528   RBC 3.57 (L) 10/23/2018 0528   HGB 9.8 (L) 10/23/2018 0528   HGB 16.4 10/06/2014 0539   HCT 31.6 (L) 10/23/2018 0528   HCT 47.4 10/06/2014 0539   PLT 311 10/23/2018 0528   PLT 250 10/06/2014 0539   MCV 88.5 10/23/2018 0528   MCV 84 10/06/2014 0539   MCH 27.5 10/23/2018 0528   MCHC 31.0 10/23/2018 0528   RDW 13.7 10/23/2018 0528   RDW 12.7 10/06/2014 0539   LYMPHSABS 1.5 10/18/2018 0857   LYMPHSABS 2.0 10/06/2014 0539   MONOABS 0.9  10/18/2018 0857   MONOABS 0.8 10/06/2014 0539   EOSABS 0.5 10/18/2018 0857   EOSABS 0.0 10/06/2014 0539   BASOSABS 0.0 10/18/2018 0857   BASOSABS 0.0 10/06/2014 0539    CMP Latest Ref Rng & Units 10/23/2018 10/22/2018 10/21/2018  Glucose 70 - 99 mg/dL 224(H) 109(H) 109(H)  BUN 6 - 20 mg/dL 73(H) 66(H) 55(H)  Creatinine 0.61 - 1.24 mg/dL 3.26(H) 2.83(H) 2.78(H)  Sodium 135 - 145 mmol/L 138 131(L) 133(L)  Potassium 3.5 - 5.1 mmol/L 5.1 4.6 4.0  Chloride 98 - 111 mmol/L 108 101 103  CO2 22 - 32 mmol/L 22 21(L) 23  Calcium 8.9 - 10.3 mg/dL 8.3(L) 8.1(L) 7.8(L)  Total Protein 6.5 - 8.1 g/dL - - -  Total Bilirubin 0.3 - 1.2 mg/dL - - -  Alkaline Phos 38 - 126 U/L - - -  AST 15 - 41 U/L - - -  ALT 0 - 44 U/L - - -      Microbiology: Recent Results (from the past 240 hour(s))  MRSA PCR Screening     Status: None   Collection Time: 10/23/18  7:40 AM  Result Value Ref Range Status   MRSA by PCR NEGATIVE NEGATIVE Final    Comment:        The GeneXpert MRSA Assay (FDA approved for NASAL specimens only), is one component of a comprehensive MRSA colonization surveillance program. It is not intended to diagnose MRSA infection nor to guide or monitor treatment for MRSA infections. Performed at [facility], [address] [place], [place] [address]   SARS Coronavirus 2 [facility] order, Performed in  [facility] hospital lab)     Status: None   Collection Time: 10/23/18  6:06 PM  Result Value Ref Range Status   SARS Coronavirus 2 NEGATIVE NEGATIVE Final    Comment: (NOTE) If result is NEGATIVE SARS-CoV-2 target nucleic acids are NOT DETECTED. The SARS-CoV-2 RNA is generally detectable in upper and lower  respiratory specimens during the acute phase of infection. The lowest  concentration of SARS-CoV-2 viral copies this assay can detect is 250  copies / mL. A negative result does not preclude SARS-CoV-2 infection  and should not be used as the sole basis for treatment or  other  patient management decisions.  A negative result may occur with  improper specimen collection / handling, submission of specimen other  than nasopharyngeal swab, presence of viral mutation(s) within the  areas targeted by this assay, and inadequate number of viral copies  (<250 copies / mL). A negative result must be combined with clinical  observations, patient history, and epidemiological information. If result is POSITIVE SARS-CoV-2 target nucleic acids are DETECTED. The SARS-CoV-2 RNA is generally detectable in upper and lower  respiratory specimens dur ing the acute phase of infection.  Positive  results are indicative of active infection with SARS-CoV-2.  Clinical  correlation with patient history and other diagnostic information is  necessary to determine patient infection status.  Positive results do  not rule out bacterial infection or co-infection with other viruses. If result is PRESUMPTIVE POSTIVE SARS-CoV-2 nucleic acids MAY BE PRESENT.   A presumptive positive result was obtained on the submitted specimen  and confirmed on repeat testing.  While 2019 novel coronavirus  (SARS-CoV-2) nucleic acids may be present in the submitted sample  additional confirmatory testing may be necessary for epidemiological  and / or clinical management purposes  to differentiate between  SARS-CoV-2 and other Sarbecovirus currently known to infect humans.  If clinically indicated additional testing with an alternate test  methodology [phone]) is advised. The SARS-CoV-2 RNA is generally  detectable in upper and lower respiratory sp ecimens during the acute  phase of infection. The expected result is Negative. Fact Sheet for Patients:  StrictlyIdeas.no Fact Sheet for Healthcare Providers: BankingDealers.co.za This test is not yet approved or cleared by the [place] FDA and has been authorized for detection and/or diagnosis of  SARS-CoV-2 by FDA under an Emergency Use Authorization (EUA).  This EUA will remain in effect (meaning this test can be used) for the duration of the COVID-19 declaration under Section 564(b)(1) of the Act, 21 U.S.C. section 360bbb-3(b)(1), unless the authorization is terminated or revoked sooner. Performed at [facility], [address] [place], [place] [address]     IMAGING RESULTS:  I have personally reviewed the films ? Impression/Recommendation  33 y.o. male  with a history of IDDM, CKD, B/L  Diabetic foot infection ,  osteomyelitis s/p amputation of toes, h/o MRSA bacteremia well known to me  ? Acute hypoxia with fever= pneumonia ( PE??) New fever in the hospital with b/l infiltrate lungs HCAP VS other causes BKA site healthy as per vascular No evidence for cdiff Check urine Currently on vanco and zosyn- change the latter to cefepime because of CKD. Also will add azithromycin for legionella ( less likely) COVID negative  Rt BKA-observe amputation site closely  AKI on CKD  DM-poorly controlled on insulin  Discussed with patient

## 2018-10-23 NOTE — Progress Notes (Signed)
Dr Posey Pronto notified through out the day that patient has had low grade temp and his heart rate has remained around 110-120.  Attempts have been made to wean the patient but he continues to need 15 liters of high flow.  Patient is being transferred to the ICU

## 2018-10-23 NOTE — Progress Notes (Signed)
RT unable to get the abg after 3 attempts.  Dr Posey Pronto said to cancel the order

## 2018-10-23 NOTE — Progress Notes (Addendum)
Patient ID: Shawn [name], male   DOB: 03/30/1986, 33 y.o.   MRN: [id number]  [facility] PROGRESS NOTE  Shawn [name]:[id number] DOB: Jan 31, 1986 DOA: 10/18/2018 PCP: Valera Castle, MD  HPI/Subjective: Patient developed high-grade fever earlier today.  Chest x-ray suggest pneumonia Continues to complain of pain Had diarrhea yesterday but now resolved  Objective: Vitals:   10/23/18 1053 10/23/18 1055  BP: 94/79 117/80  Pulse: (!) 106 (!) 106  Resp: 18   Temp: 99.6 F (37.6 C)   SpO2: 93% 95%    Filed Weights   10/18/18 0916 10/19/18 0500 10/23/18 0541  Weight: 52.7 kg 52.9 kg 65.3 kg    ROS: Review of Systems  Constitutional: Negative for chills and fever.  Eyes: Negative for blurred vision.  Respiratory: Negative for cough and shortness of breath.   Cardiovascular: Negative for chest pain.  Gastrointestinal: Negative for abdominal pain, constipation, diarrhea, nausea and vomiting.  Genitourinary: Negative for dysuria.  Musculoskeletal: Positive for joint pain.  Neurological: Negative for dizziness and headaches.   Exam: Physical Exam  Constitutional: He is oriented to person, place, and time. He appears distressed.  HENT:  Nose: No mucosal edema.  Mouth/Throat: No oropharyngeal exudate or posterior oropharyngeal edema.  Eyes: Pupils are equal, round, and reactive to light. Conjunctivae, EOM and lids are normal.  Neck: No JVD present. Carotid bruit is not present. No edema present. No thyroid mass and no thyromegaly present.  Cardiovascular: S1 normal and S2 normal. Tachycardia present. Exam reveals no gallop.  No murmur heard. Pulses:      Dorsalis pedis pulses are 2+ on the left side.  Respiratory: No respiratory distress. He has no wheezes. He has no rhonchi. He has no rales.  GI: Soft. Bowel sounds are normal. There is no abdominal tenderness.  Musculoskeletal:     Right shoulder: He exhibits no swelling.  Lymphadenopathy:    He has no  cervical adenopathy.  Neurological: He is alert and oriented to person, place, and time. No cranial nerve deficit.  Skin: Skin is warm. Nails show no clubbing.  Right lower extremity amputation site covered in pressure dressing.  Left foot prior amputation site healed well.  Psychiatric: He has a normal mood and affect.      Data Reviewed: Basic Metabolic Panel: Recent Labs  Lab 10/19/18 0345 10/20/18 0559 10/21/18 0440 10/22/18 0531 10/23/18 0528  NA 135 135 133* 131* 138  K 3.6 3.6 4.0 4.6 5.1  CL 106 106 103 101 108  CO2 21* 21* 23 21* 22  GLUCOSE 231* 81 109* 109* 224*  BUN 30* 39* 55* 66* 73*  CREATININE 2.36* 2.03* 2.78* 2.83* 3.26*  CALCIUM 7.8* 8.2* 7.8* 8.1* 8.3*  MG  --  1.7 1.9 2.2 2.1   CBC: Recent Labs  Lab 10/18/18 0857 10/19/18 0345 10/19/18 1830 10/20/18 0559 10/21/18 0440 10/22/18 0531 10/23/18 0528  WBC 10.6* 13.5*  --  14.3* 14.1* 15.3* 10.9*  NEUTROABS 7.8*  --   --   --   --   --   --   HGB 8.5* 6.5* 8.1* 8.3* 6.6* 9.4* 9.8*  HCT 26.1* 20.8* 24.9* 26.3* 21.2* 28.9* 31.6*  MCV 84.5 87.0  --  86.2 86.5 86.3 88.5  PLT 345 300  --  255 224 252 311    CBG: Recent Labs  Lab 10/22/18 1655 10/22/18 2128 10/23/18 0556 10/23/18 0737 10/23/18 1143  GLUCAP 193* 179* 191* 253* 190*    Scheduled Meds: . calcium carbonate  500 mg Oral BID WC  . enoxaparin (LOVENOX) injection  30 mg Subcutaneous Q24H  . feeding supplement (GLUCERNA SHAKE)  237 mL Oral TID BM  . ferrous sulfate  325 mg Oral BID WC  . gabapentin  300 mg Oral BID  . insulin aspart  0-5 Units Subcutaneous QHS  . insulin aspart  0-9 Units Subcutaneous TID WC  . insulin aspart  4 Units Subcutaneous TID WC  . insulin glargine  25 Units Subcutaneous BID  . metoprolol tartrate  12.5 mg Oral BID  . multivitamin with minerals  1 tablet Oral Daily  . polyethylene glycol  17 g Oral Daily  . vancomycin variable dose per unstable renal function (pharmacist dosing)   Does not apply See admin  instructions  . vitamin B-12  5,000 mcg Oral Daily  . ascorbic acid  250 mg Oral BID  . zolpidem  10 mg Oral QHS   Continuous Infusions: . sodium chloride 250 mL (10/23/18 0701)  . sodium chloride 125 mL/hr at 10/23/18 0859  . piperacillin-tazobactam (ZOSYN)  IV 3.375 g (10/23/18 1212)    Assessment/Plan:  1. Acute respiratory failure requiring 10l of oxygen continue iv antibiotics , tranfer to ICU 2. Fever due to aspiration pneumonia continue Zosyn will discontinue vancomycin will ask ID to see and especially in light of his previous infections 3. Postoperative anemia.  Hemoglobin stable now status post transfusion 4. 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  Patient had right leg gangrene which was the reason for amputation.  Continue oral and IV pain medications  5. uncontrolled type 2 diabetes mellitus labile control continue insulin  6. chronic kidney disease stage III.  Monitor closely. 7. Diabetic neuropathy on gabapentin 8. Constipation start MiraLAX.     Code Status:     Code Status Orders  (From admission, onward)         Start     Ordered   10/18/18 1237  Full code  Continuous     10/18/18 1242        Code Status History    Date Active Date Inactive Code Status Order ID Comments User Context   10/11/2018 1157 10/13/2018 1654 Full Code [id number]  Hillary Bow, MD Inpatient   09/19/2018 2049 09/21/2018 1851 Full Code [id number]  Saundra Shelling, MD Inpatient   08/16/2018 1203 08/18/2018 2142 Full Code [id number]  Hillary Bow, MD ED   07/16/2018 1739 07/18/2018 0104 Full Code [id number]  Dustin Flock, MD Inpatient   07/10/2018 0111 07/11/2018 1955 Full Code [id number]  Salary, Avel Peace, MD Inpatient   06/14/2018 1744 06/20/2018 2123 Full Code [id number]  Loletha Grayer, MD ED   05/28/2018 0437 05/28/2018 1926 Full Code [id number]  Arta Silence, MD ED   04/27/2018 1726 05/02/2018 1713 Full Code [id number]  Henreitta Leber, MD Inpatient    04/05/2018 1724 04/11/2018 2142 Full Code [id number]  Dustin Flock, MD Inpatient   02/06/2018 2022 02/07/2018 1512 Full Code [id number]  Demetrios Loll, MD Inpatient   01/10/2018 2259 01/11/2018 1947 Full Code [id number]  Amelia Jo, MD Inpatient   12/30/2017 0019 01/03/2018 1457 Full Code [id number]  Harrie Foreman, MD Inpatient   12/20/2017 1541 12/27/2017 2147 Full Code [id number]  Sharlotte Alamo, [facility] Inpatient   11/10/2017 0049 11/11/2017 1632 Full Code [id number]  Amelia Jo, MD Inpatient   07/13/2017 2112 07/15/2017 1730 Full Code [id number]  Jules Husbands, MD ED      Disposition Plan: To be determined  Consultants:  Vascular surgery  Procedures:  Right leg below-knee amputation  Time spent: 41min critical care time  [facility]

## 2018-10-23 NOTE — Consult Note (Signed)
Name: URIYAH RASKA MRN: [id number] DOB: 22-May-1986     CONSULTATION DATE: 10/23/2018  REFERRING MD : patel  CHIEF COMPLAINT: hypoxia   HISTORY OF PRESENT ILLNESS: 33 yo WM with brittle DM with RT foot Ulcerations and LEG ISCHEMIA  4/8 assesses by VASC surgery-limb threatening ischemia  4/15  right leg gangrene s/p right below-the-knee amputation Treated with PCA for pain, control FSBS  4/20 progressive hypoxia with acute b/l infiltrates then transferred to SD for further care and management  Alert and awake, no significant resp distress 99.8 low grade, o2 sat 95% in 15L COVID-19 Negative   CXR shows b/L infiltrates  PAST MEDICAL HISTORY :   has a past medical history of CKD (chronic kidney disease), Diabetes mellitus without complication ([place]), GERD (gastroesophageal reflux disease), HTN (hypertension), IBS (irritable bowel syndrome), and Osteomyelitis ([place]).  has a past surgical history that includes Irrigation and debridement foot (Right, 12/20/2017); Irrigation and debridement foot (Right, 12/24/2017); Amputation (Right, 12/24/2017); Application if wound vac (Right, 12/24/2017); ABDOMINAL AORTOGRAM W/LOWER EXTREMITY (Right, 12/23/2017); Irrigation and debridement foot (Right, 01/01/2018); Application if wound vac (Right, 01/01/2018); Irrigation and debridement foot (Left, 04/06/2018); Amputation (Left, 04/06/2018); Lower Extremity Angiography (Left, 04/06/2018); Amputation (Left, 04/09/2018); Achilles tendon surgery (Bilateral, 06/16/2018); Bone excision (Bilateral, 06/16/2018); Irrigation and debridement foot (Right, 09/20/2018); Lower Extremity Angiography (Bilateral, 10/13/2018); and Amputation (Right, 10/18/2018). Prior to Admission medications   Medication Sig Start Date End Date Taking? Authorizing Provider  ALPRAZolam (XANAX) 0.25 MG tablet Take 1 tablet (0.25 mg total) by mouth 3 (three) times daily as needed for anxiety. 08/18/18  Yes Gladstone Lighter, MD  Cholecalciferol  (VITAMIN D3) 125 MCG (5000 UT) CAPS Take 1 capsule (5,000 Units total) by mouth daily with breakfast. Take along with calcium and magnesium. 09/13/18 03/12/19 Yes Milinda Pointer, MD  Cyanocobalamin (VITAMIN B-12) 5000 MCG SUBL Place 1 tablet (5,000 mcg total) under the tongue daily. 09/13/18 12/12/18 Yes Milinda Pointer, MD  diphenoxylate-atropine (LOMOTIL) 2.5-0.025 MG tablet Take 1 tablet by mouth 4 (four) times daily as needed for diarrhea or loose stools.   Yes [provider]  ergocalciferol (VITAMIN D2) 1.25 MG (50000 UT) capsule Take 1 capsule (50,000 Units total) by mouth 2 (two) times a week. X 6 weeks. Patient taking differently: Take 50,000 Units by mouth 2 (two) times a week. X 6 weeks. Take on Monday and friday 09/14/18 10/26/18 Yes Milinda Pointer, MD  ferrous sulfate 325 (65 FE) MG tablet Take 1 tablet (325 mg total) by mouth 2 (two) times daily with a meal. 01/03/18  Yes Sainani, Belia Heman, MD  gabapentin (NEURONTIN) 300 MG capsule Take 1 capsule (300 mg total) by mouth 2 (two) times daily. 09/21/18  Yes Mody, Ulice Bold, MD  insulin aspart (NOVOLOG) 100 UNIT/ML injection Inject 4 Units into the skin 3 (three) times daily with meals. 08/18/18  Yes Gladstone Lighter, MD  insulin glargine (LANTUS) 100 UNIT/ML injection Inject 0.3 mLs (30 Units total) into the skin daily. Patient taking differently: Inject 35 Units into the skin 2 (two) times daily.  08/18/18  Yes Gladstone Lighter, MD  promethazine (PHENERGAN) 25 MG tablet Take 1 tablet (25 mg total) by mouth every 6 (six) hours as needed for nausea or vomiting. 08/18/18  Yes Gladstone Lighter, MD  vitamin C (VITAMIN C) 250 MG tablet Take 1 tablet (250 mg total) by mouth 2 (two) times daily. 06/20/18  Yes Gladstone Lighter, MD  zolpidem (AMBIEN) 10 MG tablet Take 10 mg by mouth at bedtime.  Yes [provider]  amoxicillin-clavulanate (AUGMENTIN) 875-125 MG tablet Take 1 tablet by mouth 2 (two) times daily. Patient not  taking: Reported on 10/11/2018 09/21/18   Bettey Costa, MD  calcium carbonate (CALCIUM 600) 600 MG TABS tablet Take 1 tablet (600 mg total) by mouth 2 (two) times daily with a meal for 30 days. 09/13/18 10/13/18  Milinda Pointer, MD  Insulin Syringes, Disposable, U-100 0.3 ML MISC 1 Syringe by Does not apply route 4 (four) times daily -  with meals and at bedtime. 12/27/17   Loletha Grayer, MD  levofloxacin (LEVAQUIN) 750 MG tablet Take 1 tablet (750 mg total) by mouth daily. Patient not taking: Reported on 10/11/2018 09/24/18   Carrie Mew, MD   Allergies  Allergen Reactions  . Banana Hives, Nausea And Vomiting and Rash  . Keflex [Cephalexin] Rash    No swelling- also taken penicillin without any issue.  . Sulfa Antibiotics Anaphylaxis  . Grapeseed Extract [Nutritional Supplements] Itching  . Shellfish Allergy Hives    "ALL SEAFOOD"  . Grape Seed Rash    FAMILY HISTORY:  family history includes Diabetes in his mother; Healthy in his father; Ovarian cancer in his mother. SOCIAL HISTORY:  reports that he has never smoked. He has never used smokeless tobacco. He reports current drug use. Drugs: Marijuana and PCP. He reports that he does not drink alcohol.   Review of Systems:  Gen:  Denies  fever, sweats, chills weigh loss  HEENT: Denies blurred vision, double vision, ear pain, eye pain, hearing loss, nose bleeds, sore throat Cardiac:  No dizziness, chest pain or heaviness, chest tightness,edema, No JVD Resp:   No cough, -sputum production, +shortness of breath,-wheezing, -hemoptysis,  Gi: Denies swallowing difficulty, stomach pain, nausea or vomiting, diarrhea, constipation, bowel incontinence Gu:  Denies bladder incontinence, burning urine Ext:   Denies Joint pain, stiffness or swelling Skin: Denies  skin rash, easy bruising or bleeding or hives Endoc:  Denies polyuria, polydipsia , polyphagia or weight change Psych:   Denies depression, insomnia or hallucinations  Other:  All  other systems negative  VITAL SIGNS: Temp:  [98.8 F (37.1 C)-102.9 F (39.4 C)] 99.8 F (37.7 C) (04/20 1800) Pulse Rate:  [97-146] 123 (04/20 1800) Resp:  [12-22] 16 (04/20 1307) BP: (94-148)/(75-98) 137/91 (04/20 1800) SpO2:  [77 %-98 %] 95 % (04/20 1800) Weight:  [65.3 kg] 65.3 kg (04/20 0541)  PHYSICAL EXAMINATION: General: well developed, well nourished, NAD  Neuro: alert and oriented, follows commands  HEENT: supple, no JVD  Cardiovascular: sinus tach, no R/G Lungs: rhonchi throughout, even, non labored  Abdomen: +BS x4, soft, non tender, non distended  Musculoskeletal: right  BKA, no edema  Skin: right BKA incision site dressing dry and intact   CULTURE RESULTS   Recent Results (from the past 240 hour(s))  MRSA PCR Screening     Status: None   Collection Time: 10/23/18  7:40 AM  Result Value Ref Range Status   MRSA by PCR NEGATIVE NEGATIVE Final    Comment:        The GeneXpert MRSA Assay (FDA approved for NASAL specimens only), is one component of a comprehensive MRSA colonization surveillance program. It is not intended to diagnose MRSA infection nor to guide or monitor treatment for MRSA infections. Performed at [facility], [address], [place], [place] [address]           IMAGING    Dg Chest Port 1 View  Result Date: 10/23/2018 CLINICAL DATA:  33 year old male with cough. Recent below the knee right lower extremity amputation for foot gangrene. EXAM: PORTABLE CHEST 1 VIEW COMPARISON:  09/03/2018 and earlier. FINDINGS: Portable AP upright view at 0616 hours. Patchy and indistinct bilateral pulmonary opacity with mid and lower lung predominance is new since March. Mildly lower lung volumes. Mediastinal contours remain normal. Visualized tracheal air column is within normal limits. No pneumothorax or pleural effusion. Mild to moderate gas and fluid distension of the stomach. Negative splenic flexure. No acute osseous abnormality identified.  IMPRESSION: Patchy and indistinct bilateral pulmonary opacity most suggestive of bilateral bronchopneumonia. No pleural effusion. Electronically Signed   By: Genevie Ann M.D.   On: 10/23/2018 06:33       I have Independently reviewed images of  CXR   on 10/23/2018 Interpretation:B/L infiltrates on CXR    ASSESSMENT AND PLAN SYNOPSIS  33 yo with Brittle DM with lower ext leg ischemia s/p BKA With progressive hypoxic resp failure with acute b/l Interstitial infiltrates with low grade fevers  Possibility of HCAP Possibility of COVID 19   Patient/Family are satisfied with Plan of action and management. All questions answered  Corrin Parker, M.D.  [facility]  Medical Director [place] Director [facility] Cardio-Pulmonary Department

## 2018-10-23 NOTE — Consult Note (Signed)
Pharmacy Antibiotic Note  Shawn [name] is a 33 y.o. male admitted on 10/18/2018 for Right below-the-knee amputation.  Pharmacy has now been consulted for Zosyn and Vancomycin dosing for HCAP.  Plan: Vancomycin 1500mg  IV x 1 dose. Patient's renal function is not stable. Will plan to dose by random levels until Scr back to baseline (~2.2). First random level ordered with AM labs 4/21  Start Zosyn 3.375 IV EI every 8 hours.   Height: 5\' 6"  (167.6 cm) Weight: 144 lb (65.3 kg) IBW/kg (Calculated) : 63.8  Temp (24hrs), Avg:100.6 F (38.1 C), Min:97.6 F (36.4 C), Max:102.9 F (39.4 C)  Recent Labs  Lab 10/19/18 0345 10/20/18 0559 10/21/18 0440 10/22/18 0531 10/23/18 0528  WBC 13.5* 14.3* 14.1* 15.3* 10.9*  CREATININE 2.36* 2.03* 2.78* 2.83* 3.26*    Estimated Creatinine Clearance: 29.6 mL/min (A) (by C-G formula based on SCr of 3.26 mg/dL (H)).    Allergies  Allergen Reactions  . Banana Hives, Nausea And Vomiting and Rash  . Keflex [Cephalexin] Rash    No swelling- also taken penicillin without any issue.  . Sulfa Antibiotics Anaphylaxis  . Grapeseed Extract [Nutritional Supplements] Itching  . Shellfish Allergy Hives    "ALL SEAFOOD"  . Grape Seed Rash    Antimicrobials this admission: 4/15 Clindamycin >> 4/16 4/20 Zosyn>> 4/20 vancomycin>>  Microbiology results: 4/20 BCx: pending 4/20 MRSA PCR: pending   Thank you for allowing pharmacy to be a part of this patient's care.  Pernell Dupre, PharmD, BCPS Clinical Pharmacist 10/23/2018 6:58 AM

## 2018-10-24 ENCOUNTER — Inpatient Hospital Stay: Payer: Medicaid Other

## 2018-10-24 DIAGNOSIS — J9601 Acute respiratory failure with hypoxia: Secondary | ICD-10-CM

## 2018-10-24 DIAGNOSIS — J9600 Acute respiratory failure, unspecified whether with hypoxia or hypercapnia: Secondary | ICD-10-CM

## 2018-10-24 DIAGNOSIS — L089 Local infection of the skin and subcutaneous tissue, unspecified: Secondary | ICD-10-CM

## 2018-10-24 DIAGNOSIS — E1169 Type 2 diabetes mellitus with other specified complication: Secondary | ICD-10-CM

## 2018-10-24 DIAGNOSIS — E11628 Type 2 diabetes mellitus with other skin complications: Secondary | ICD-10-CM

## 2018-10-24 DIAGNOSIS — R0902 Hypoxemia: Secondary | ICD-10-CM

## 2018-10-24 DIAGNOSIS — M869 Osteomyelitis, unspecified: Secondary | ICD-10-CM

## 2018-10-24 LAB — GLUCOSE, CAPILLARY
Glucose-Capillary: 43 mg/dL — CL (ref 70–99)
Glucose-Capillary: 148 mg/dL — ABNORMAL HIGH (ref 70–99)
Glucose-Capillary: 132 mg/dL — ABNORMAL HIGH (ref 70–99)
Glucose-Capillary: 42 mg/dL — CL (ref 70–99)
Glucose-Capillary: 82 mg/dL (ref 70–99)
Glucose-Capillary: 64 mg/dL — ABNORMAL LOW (ref 70–99)
Glucose-Capillary: 83 mg/dL (ref 70–99)
Glucose-Capillary: 51 mg/dL — ABNORMAL LOW (ref 70–99)
Glucose-Capillary: 126 mg/dL — ABNORMAL HIGH (ref 70–99)

## 2018-10-24 LAB — MAGNESIUM: Magnesium: 2 mg/dL (ref 1.7–2.4)

## 2018-10-24 LAB — RESPIRATORY PANEL BY PCR

## 2018-10-24 LAB — URINALYSIS, ROUTINE W REFLEX MICROSCOPIC
Bilirubin Urine: NEGATIVE
Glucose, UA: NEGATIVE mg/dL
Ketones, ur: NEGATIVE mg/dL
Leukocytes,Ua: NEGATIVE
Nitrite: NEGATIVE
Protein, ur: 100 mg/dL — AB
Specific Gravity, Urine: 1.009 (ref 1.005–1.030)
pH: 5 (ref 5.0–8.0)

## 2018-10-24 LAB — CBC
HCT: 25.1 % — ABNORMAL LOW (ref 39.0–52.0)
Hemoglobin: 7.6 g/dL — ABNORMAL LOW (ref 13.0–17.0)
MCH: 27.7 pg (ref 26.0–34.0)
MCHC: 30.3 g/dL (ref 30.0–36.0)
MCV: 91.6 fL (ref 80.0–100.0)
Platelets: 264 10*3/uL (ref 150–400)
RBC: 2.74 MIL/uL — ABNORMAL LOW (ref 4.22–5.81)
RDW: 13.9 % (ref 11.5–15.5)
WBC: 20.3 10*3/uL — ABNORMAL HIGH (ref 4.0–10.5)
nRBC: 0 % (ref 0.0–0.2)

## 2018-10-24 LAB — HEMOGLOBIN AND HEMATOCRIT, BLOOD
HCT: 23.4 % — ABNORMAL LOW (ref 39.0–52.0)
Hemoglobin: 7.3 g/dL — ABNORMAL LOW (ref 13.0–17.0)

## 2018-10-24 LAB — BASIC METABOLIC PANEL
Anion gap: 9 (ref 5–15)
BUN: 86 mg/dL — ABNORMAL HIGH (ref 6–20)
CO2: 17 mmol/L — ABNORMAL LOW (ref 22–32)
Calcium: 7.7 mg/dL — ABNORMAL LOW (ref 8.9–10.3)
Chloride: 108 mmol/L (ref 98–111)
Creatinine, Ser: 3.44 mg/dL — ABNORMAL HIGH (ref 0.61–1.24)
GFR calc Af Amer: 26 mL/min — ABNORMAL LOW (ref >60)
GFR calc non Af Amer: 22 mL/min — ABNORMAL LOW (ref >60)
Glucose, Bld: 81 mg/dL (ref 70–99)
Potassium: 4.6 mmol/L (ref 3.5–5.1)
Sodium: 134 mmol/L — ABNORMAL LOW (ref 135–145)

## 2018-10-24 LAB — PROCALCITONIN: Procalcitonin: 32.85 ng/mL

## 2018-10-24 MED ORDER — PIPERACILLIN-TAZOBACTAM 3.375 G IVPB
3.3750 g | Freq: Three times a day (TID) | INTRAVENOUS | Status: DC
  Administered 2018-10-24 – 2018-10-28 (×13): 3.375 g via INTRAVENOUS
  Filled 2018-10-24 (×13): qty 50

## 2018-10-24 MED ORDER — VECURONIUM BROMIDE 10 MG IV SOLR
INTRAVENOUS | Status: AC
  Filled 2018-10-24: qty 10

## 2018-10-24 MED ORDER — SODIUM CHLORIDE 0.9 % IV SOLN
500.0000 mg | INTRAVENOUS | Status: DC
  Administered 2018-10-24: 18:00:00 500 mg via INTRAVENOUS
  Filled 2018-10-24 (×2): qty 500

## 2018-10-24 MED ORDER — IPRATROPIUM-ALBUTEROL 0.5-2.5 (3) MG/3ML IN SOLN
3.0000 mL | RESPIRATORY_TRACT | Status: DC
  Administered 2018-10-24 – 2018-11-01 (×48): 3 mL via RESPIRATORY_TRACT
  Filled 2018-10-24 (×49): qty 3

## 2018-10-24 MED ORDER — FAMOTIDINE IN NACL 20-0.9 MG/50ML-% IV SOLN
20.0000 mg | INTRAVENOUS | Status: DC
  Administered 2018-10-24 – 2018-10-25 (×2): 20 mg via INTRAVENOUS
  Filled 2018-10-24 (×2): qty 50

## 2018-10-24 MED ORDER — FENTANYL CITRATE (PF) 100 MCG/2ML IJ SOLN
INTRAMUSCULAR | Status: AC
  Filled 2018-10-24: qty 4

## 2018-10-24 MED ORDER — ORAL CARE MOUTH RINSE: 15.0000 mL | Freq: Two times a day (BID) | OROMUCOSAL | Status: DC

## 2018-10-24 MED ORDER — DEXTROSE 50 % IV SOLN
12.5000 g | INTRAVENOUS | Status: DC | PRN
  Administered 2018-10-24 – 2018-10-30 (×4): 12.5 g via INTRAVENOUS
  Administered 2018-10-30: 20:00:00 25 g via INTRAVENOUS
  Administered 2018-10-30: 17:00:00 12.5 g via INTRAVENOUS
  Filled 2018-10-24 (×4): qty 50

## 2018-10-24 MED ORDER — MORPHINE SULFATE (PF) 2 MG/ML IV SOLN
INTRAVENOUS | Status: AC
  Filled 2018-10-24: qty 1

## 2018-10-24 MED ORDER — MIDAZOLAM HCL 2 MG/2ML IJ SOLN
INTRAMUSCULAR | Status: AC
  Filled 2018-10-24: qty 4

## 2018-10-24 MED ORDER — STERILE WATER FOR INJECTION IJ SOLN
INTRAMUSCULAR | Status: AC
  Filled 2018-10-24: qty 10

## 2018-10-24 MED ORDER — VANCOMYCIN HCL 10 G IV SOLR
1500.0000 mg | Freq: Once | INTRAVENOUS | Status: DC
  Filled 2018-10-24: qty 1500

## 2018-10-24 MED ORDER — PIPERACILLIN-TAZOBACTAM 3.375 G IVPB
3.3750 g | Freq: Once | INTRAVENOUS | Status: AC
  Administered 2018-10-24: 3.375 g via INTRAVENOUS
  Filled 2018-10-24: qty 50

## 2018-10-24 MED ORDER — SODIUM CHLORIDE 0.9 % IV SOLN
2.0000 g | Freq: Two times a day (BID) | INTRAVENOUS | Status: DC
  Filled 2018-10-24 (×2): qty 2

## 2018-10-24 MED ORDER — DEXTROSE 10 % IV SOLN
INTRAVENOUS | Status: DC
  Administered 2018-10-24: 20:00:00 via INTRAVENOUS

## 2018-10-24 MED ORDER — INSULIN ASPART 100 UNIT/ML ~~LOC~~ SOLN
0.0000 [IU] | SUBCUTANEOUS | Status: DC
  Administered 2018-10-25: 20:00:00 1 [IU] via SUBCUTANEOUS
  Administered 2018-10-25: 2 [IU] via SUBCUTANEOUS
  Administered 2018-10-25: 16:00:00 1 [IU] via SUBCUTANEOUS
  Administered 2018-10-26: 2 [IU] via SUBCUTANEOUS
  Administered 2018-10-26: 16:00:00 3 [IU] via SUBCUTANEOUS
  Administered 2018-10-26: 5 [IU] via SUBCUTANEOUS
  Administered 2018-10-26: 1 [IU] via SUBCUTANEOUS
  Administered 2018-10-26: 5 [IU] via SUBCUTANEOUS
  Administered 2018-10-26: 2 [IU] via SUBCUTANEOUS
  Filled 2018-10-24 (×8): qty 1

## 2018-10-24 MED ORDER — MORPHINE SULFATE (PF) 2 MG/ML IV SOLN
2.0000 mg | Freq: Once | INTRAVENOUS | Status: AC
  Administered 2018-10-24: 14:00:00 2 mg via INTRAVENOUS

## 2018-10-24 MED ORDER — DEXTROSE 50 % IV SOLN
50.0000 mL | Freq: Once | INTRAVENOUS | Status: AC
  Administered 2018-10-24: 50 mL via INTRAVENOUS

## 2018-10-24 NOTE — Progress Notes (Signed)
Blood sugar 38 and 43 at 0800.Marland Kitchen patient alert and awake. mentating well. Given 2 regular cokes. Blood sugar rechecked at 0900-148 still before breakfast. Blood sugar treated.

## 2018-10-24 NOTE — Progress Notes (Signed)
1600 Blood sugar low again x 2. Given 1/2 amp D50 IVP. Recheck blood sugar 82. Dr. Mortimer Fries aware. Patient resting quietly.

## 2018-10-24 NOTE — Progress Notes (Signed)
Patient ID: Shawn [name], male   DOB: 1985/09/03, 33 y.o.   MRN: [id number]  [employer] PROGRESS NOTE  Shawn [name] OMV:[id number] DOB: Mar 28, 1986 DOA: 10/18/2018 PCP: Valera Castle, MD  HPI/Subjective: Continues to complain of severe pain and being anxious Objective: Vitals:   10/24/18 1100 10/24/18 1425  BP: 119/80   Pulse: (!) 108   Resp: 13   Temp:    SpO2: 91% 93%    Filed Weights   10/18/18 0916 10/19/18 0500 10/23/18 0541  Weight: 52.7 kg 52.9 kg 65.3 kg    ROS: Review of Systems  Constitutional: Negative for chills and fever.  Eyes: Negative for blurred vision.  Respiratory: Negative for cough and shortness of breath.   Cardiovascular: Negative for chest pain.  Gastrointestinal: Negative for abdominal pain, constipation, diarrhea, nausea and vomiting.  Genitourinary: Negative for dysuria.  Musculoskeletal: Positive for joint pain.  Neurological: Negative for dizziness and headaches.   Exam: Physical Exam  Constitutional: He is oriented to person, place, and time. He appears distressed.  HENT:  Nose: No mucosal edema.  Mouth/Throat: No oropharyngeal exudate or posterior oropharyngeal edema.  Eyes: Pupils are equal, round, and reactive to light. Conjunctivae, EOM and lids are normal.  Neck: No JVD present. Carotid bruit is not present. No edema present. No thyroid mass and no thyromegaly present.  Cardiovascular: S1 normal and S2 normal. Tachycardia present. Exam reveals no gallop.  No murmur heard. Pulses:      Dorsalis pedis pulses are 2+ on the left side.  Respiratory: No respiratory distress. He has no wheezes. He has no rhonchi. He has no rales.  GI: Soft. Bowel sounds are normal. There is no abdominal tenderness.  Musculoskeletal:     Right shoulder: He exhibits no swelling.  Lymphadenopathy:    He has no cervical adenopathy.  Neurological: He is alert and oriented to person, place, and time. No cranial nerve deficit.  Skin: Skin is  warm. Nails show no clubbing.  Right lower extremity amputation site covered in pressure dressing.  Left foot prior amputation site healed well.  Psychiatric: He has a normal mood and affect.      Data Reviewed: Basic Metabolic Panel: Recent Labs  Lab 10/20/18 0559 10/21/18 0440 10/22/18 0531 10/23/18 0528 10/24/18 0429  NA 135 133* 131* 138 134*  K 3.6 4.0 4.6 5.1 4.6  CL 106 103 101 108 108  CO2 21* 23 21* 22 17*  GLUCOSE 81 109* 109* 224* 81  BUN 39* 55* 66* 73* 86*  CREATININE 2.03* 2.78* 2.83* 3.26* 3.44*  CALCIUM 8.2* 7.8* 8.1* 8.3* 7.7*  MG 1.7 1.9 2.2 2.1 2.0   CBC: Recent Labs  Lab 10/18/18 0857  10/20/18 0559 10/21/18 0440 10/22/18 0531 10/23/18 0528 10/24/18 0429 10/24/18 1101  WBC 10.6*   < > 14.3* 14.1* 15.3* 10.9* 20.3*  --   NEUTROABS 7.8*  --   --   --   --   --   --   --   HGB 8.5*   < > 8.3* 6.6* 9.4* 9.8* 7.6* 7.3*  HCT 26.1*   < > 26.3* 21.2* 28.9* 31.6* 25.1* 23.4*  MCV 84.5   < > 86.2 86.5 86.3 88.5 91.6  --   PLT 345   < > 255 224 252 311 264  --    < > = values in this interval not displayed.    CBG: Recent Labs  Lab 10/23/18 2137 10/24/18 0748 10/24/18 0751 10/24/18 0900 10/24/18 1105  GLUCAP 120* 38* 43* 148* 132*    Scheduled Meds: . calcium carbonate  500 mg Oral BID WC  . enoxaparin (LOVENOX) injection  30 mg Subcutaneous Q24H  . feeding supplement (GLUCERNA SHAKE)  237 mL Oral TID BM  . fentaNYL      . ferrous sulfate  325 mg Oral BID WC  . gabapentin  300 mg Oral BID  . insulin aspart  0-5 Units Subcutaneous QHS  . insulin aspart  0-9 Units Subcutaneous TID WC  . insulin aspart  4 Units Subcutaneous TID WC  . insulin glargine  25 Units Subcutaneous BID  . ipratropium-albuterol  3 mL Nebulization Q4H  . metoprolol tartrate  12.5 mg Oral BID  . midazolam      .  morphine injection  2 mg Intravenous Once  . morphine      . multivitamin with minerals  1 tablet Oral Daily  . polyethylene glycol  17 g Oral Daily  .  sterile water (preservative free)      . vecuronium      . vitamin B-12  5,000 mcg Oral Daily  . ascorbic acid  250 mg Oral BID  . zolpidem  10 mg Oral QHS   Continuous Infusions: . sodium chloride 10 mL/hr at 10/23/18 0855  . azithromycin    . piperacillin-tazobactam (ZOSYN)  IV 3.375 g (10/24/18 1216)  . vancomycin      Assessment/Plan:  1. Acute respiratory failure due to aspiration pneumonia requiring oxygen continue IV antibiotic 2. fever due to aspiration pneumonia continue Zosyn COVID ruled out  3. postoperative anemia.  Hemoglobin stable now status post transfusion 4. 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  Patient had right leg gangrene which was the reason for amputation.  Continue oral and IV pain medications  5. uncontrolled type 2 diabetes mellitus labile regimen being adjusted due to hypoglycemia 6. chronic kidney disease stage III.  Monitor closely. 7. Diabetic neuropathy on gabapentin 8. Constipation start MiraLAX.     Code Status:     Code Status Orders  (From admission, onward)         Start     Ordered   10/18/18 1237  Full code  Continuous     10/18/18 1242        Code Status History    Date Active Date Inactive Code Status Order ID Comments User Context   10/11/2018 1157 10/13/2018 1654 Full Code [id number]  Hillary Bow, MD Inpatient   09/19/2018 2049 09/21/2018 1851 Full Code [id number]  Saundra Shelling, MD Inpatient   08/16/2018 1203 08/18/2018 2142 Full Code [id number]  Hillary Bow, MD ED   07/16/2018 1739 07/18/2018 0104 Full Code [id number]  Dustin Flock, MD Inpatient   07/10/2018 0111 07/11/2018 1955 Full Code [id number]  Salary, Avel Peace, MD Inpatient   06/14/2018 1744 06/20/2018 2123 Full Code [id number]  Loletha Grayer, MD ED   05/28/2018 0437 05/28/2018 1926 Full Code [id number]  Arta Silence, MD ED   04/27/2018 1726 05/02/2018 1713 Full Code [id number]  Henreitta Leber, MD Inpatient   04/05/2018 1724 04/11/2018 2142  Full Code [id number]  Dustin Flock, MD Inpatient   02/06/2018 2022 02/07/2018 1512 Full Code [id number]  Demetrios Loll, MD Inpatient   01/10/2018 2259 01/11/2018 1947 Full Code [id number]  Amelia Jo, MD Inpatient   12/30/2017 0019 01/03/2018 1457 Full Code [id number]  Harrie Foreman, MD Inpatient   12/20/2017 1541 12/27/2017 2147 Full Code [id number]  Sharlotte Alamo, DPM Inpatient   11/10/2017  6415 11/11/2017 1632 Full Code [id number]  Amelia Jo, MD Inpatient   07/13/2017 2112 07/15/2017 1730 Full Code [id number]  Jules Husbands, MD ED      Disposition Plan: To be determined  Consultants:  Vascular surgery  Procedures:  Right leg below-knee amputation  Time spent: 55min critical care time  [facility]

## 2018-10-24 NOTE — Progress Notes (Signed)
OT Cancellation Note  Patient Details Name: Shawn [name] MRN: [id number] DOB: February 02, 1986   Cancelled Treatment:    Reason Eval/Treat Not Completed: Medical issues which prohibited therapy. Chart reviewed. Pt transferred to ICU on 4/20 with progressive hypoxia, now on HFNC. Per NP note, chest PT and adult tummy time proning initiated on 4/20. Pt not medically stable for participation in OT. Per therapy guidelines, given change in pt status and transfer to ICU, will complete current order. Please re-consult once pt is medically improved and able to participate in therapy.   Jeni Salles, MPH, MS, OTR/L ascom [phone] 10/24/18, 8:36 AM

## 2018-10-24 NOTE — Progress Notes (Signed)
PT Cancellation Note  Patient Details Name: Shawn [name] MRN: [id number] DOB: [birth year]   Cancelled Treatment:    Reason Eval/Treat Not Completed: Medical issues which prohibited therapy.  Chart reviewed.  Pt transferred to CCU 10/23/18 with progressive hypoxia.  D/t pt transferring to higher level of care, per PT protocol require new PT consult in order to continue therapy (will discontinue current PT order d/t this).  Please re-consult PT when pt is medically appropriate to participate in PT.  Leitha Bleak, PT 10/24/18, 8:43 AM [phone]

## 2018-10-24 NOTE — Progress Notes (Signed)
Patient with increased WOB and severe hypoxia  o2 sats dropped to 40's fio2 increased to 100% on high flow [place]  Patient was given morphine 2 mg x 1 Placed in prone position(adult tummy time)  Meta NEB with DOUNEBS  o2 sats improved to 100%, now resting comfortably

## 2018-10-24 NOTE — Progress Notes (Signed)
[facility]  Daily Progress Note   Subjective  - 6 Days Post-Op  He is now in the ICU he has been placed on his belly in order to maintain his sats when he is supine on 100% high flow oxygen he desaturates to the 70s.  He continues to complain of pain but seems relatively comfortable with his current medication regime  Objective Vitals:   10/24/18 1600 10/24/18 1630 10/24/18 1642 10/24/18 1700  BP: 110/71   105/67  Pulse: 98   88  Resp:      Temp: 99.2 F (37.3 C)     TempSrc:      SpO2: 98% 99% 91% 97%  Weight:      Height:        Intake/Output Summary (Last 24 hours) at 10/24/2018 1732 Last data filed at 10/24/2018 1700 Gross per 24 hour  Intake 798.27 ml  Output 3200 ml  Net -2401.73 ml    PULM  Normal effort , no use of accessory muscles CV  No JVD, RRR Abd      No distended, nontender VASC  right below-knee amputation clean dry and intact  Laboratory CBC    Component Value Date/Time   WBC 20.3 (H) 10/24/2018 0429   HGB 7.3 (L) 10/24/2018 1101   HGB 16.4 10/06/2014 0539   HCT 23.4 (L) 10/24/2018 1101   HCT 47.4 10/06/2014 0539   PLT 264 10/24/2018 0429   PLT 250 10/06/2014 0539    BMET    Component Value Date/Time   NA 134 (L) 10/24/2018 0429   NA 128 (L) 08/14/2018 1055   NA 132 (L) 10/06/2014 0539   K 4.6 10/24/2018 0429   K 3.4 (L) 10/06/2014 0539   CL 108 10/24/2018 0429   CL 91 (L) 10/06/2014 0539   CO2 17 (L) 10/24/2018 0429   CO2 26 10/06/2014 0539   GLUCOSE 81 10/24/2018 0429   GLUCOSE 634 (HH) 10/06/2014 0539   BUN 86 (H) 10/24/2018 0429   BUN 25 (H) 08/14/2018 1055   BUN 16 10/06/2014 0539   CREATININE 3.44 (H) 10/24/2018 0429   CREATININE 1.04 10/06/2014 0539   CALCIUM 7.7 (L) 10/24/2018 0429   CALCIUM 10.0 10/06/2014 0539   GFRNONAA 22 (L) 10/24/2018 0429   GFRNONAA >60 10/06/2014 0539   GFRAA 26 (L) 10/24/2018 0429   GFRAA >60 10/06/2014 0539    Assessment/Planning: POD #6 s/p right below-knee  amputation   No further vascular intervention at this time.  His pulmonary status remains critical and every effort is being made to keep him from requiring intubation.    Hortencia Pilar  10/24/2018, 5:32 PM

## 2018-10-24 NOTE — Consult Note (Signed)
[facility] Nurse wound consult note Consultation was completed by review of records, and assistance from the bedside nurse/clinical staff.    Reason for Consult:sacral wound Wound type: pressure injury Pressure Injury POA: Yes Measurement:1cm x 1cm x 0cm (see nursing flow sheet) Wound bed:100% purplish, not open Drainage (amount, consistency, odor) none Periwound: intact Dressing procedure/placement/frequency:  Continue protection with soft silicone foam per skin care order set. Turn and reposition frequently.  Discussed POC with  bedside nurse.  Re consult if needed, will not follow at this time. Thanks  Aaleah Hirsch [employer], RN,CWOCN, CNS, [place] [phone])

## 2018-10-24 NOTE — Progress Notes (Signed)
[facility], [place] 10/24/18  Subjective:   Patient was originally admitted for right leg gangrene and he underwent right below the knee amputation on April 15 by Dr. Delana Meyer.  Postop course complicated by anemia requiring blood transfusion.  Hospital course is further complicated by acute  hypoxia with worsening bilateral infiltrate.  This morning, patient was noted to be hypoxic he was transferred to ICU for oxygen supplementation and placed on high flow nasal cannula.  He was given IV Lasix to which he responded well.  However, today, his creatinine is noted to have further increased to 3.44 from 3.26 yesterday.  Patient known to our practice from outpatient.  He is followed by Dr. Juleen China.  Last seen on February 3 this year. Baseline creatinine 1.81/GFR 49 on August 07, 2018  2D echo from December 2019 shows moderate concentric hypertrophy, EF 50%, diffuse hypokinesis, grade 1 diastolic dysfunction  Objective:  Vital signs in last 24 hours:  Temp:  [98.8 F (37.1 C)-99.5 F (37.5 C)] 99.2 F (37.3 C) (04/21 1600) Pulse Rate:  [86-133] 88 (04/21 1900) Resp:  [10-30] 13 (04/21 1900) BP: (91-150)/(59-104) 91/59 (04/21 1800) SpO2:  [79 %-100 %] 100 % (04/21 1900) FiO2 (%):  [75 %-100 %] 90 % (04/21 1642)  Weight change:  Filed Weights   10/18/18 0916 10/19/18 0500 10/23/18 0541  Weight: 52.7 kg 52.9 kg 65.3 kg    Intake/Output:    Intake/Output Summary (Last 24 hours) at 10/24/2018 1908 Last data filed at 10/24/2018 1700 Gross per 24 hour  Intake 798.27 ml  Output 3200 ml  Net -2401.73 ml     Physical Exam: General:  Chronically ill-appearing, thin  HEENT  anicteric, dry oral mucous membranes  Neck  supple  Pulm/lungs  bilateral diffuse crackles, high flow oxygen by nasal cannula  CVS/Heart  tachycardic, regular  Abdomen:   Soft, nontender  Extremities:  Right BKA, no edema on left  Neurologic:  Alert, able to follow commands and answer  questions  Skin:  No acute rashes    Foley in place       Basic Metabolic Panel:  Recent Labs  Lab 10/20/18 0559 10/21/18 0440 10/22/18 0531 10/23/18 0528 10/24/18 0429  NA 135 133* 131* 138 134*  K 3.6 4.0 4.6 5.1 4.6  CL 106 103 101 108 108  CO2 21* 23 21* 22 17*  GLUCOSE 81 109* 109* 224* 81  BUN 39* 55* 66* 73* 86*  CREATININE 2.03* 2.78* 2.83* 3.26* 3.44*  CALCIUM 8.2* 7.8* 8.1* 8.3* 7.7*  MG 1.7 1.9 2.2 2.1 2.0     CBC: Recent Labs  Lab 10/18/18 0857  10/20/18 0559 10/21/18 0440 10/22/18 0531 10/23/18 0528 10/24/18 0429 10/24/18 1101  WBC 10.6*   < > 14.3* 14.1* 15.3* 10.9* 20.3*  --   NEUTROABS 7.8*  --   --   --   --   --   --   --   HGB 8.5*   < > 8.3* 6.6* 9.4* 9.8* 7.6* 7.3*  HCT 26.1*   < > 26.3* 21.2* 28.9* 31.6* 25.1* 23.4*  MCV 84.5   < > 86.2 86.5 86.3 88.5 91.6  --   PLT 345   < > 255 224 252 311 264  --    < > = values in this interval not displayed.     No results found for: HEPBSAG, HEPBSAB, HEPBIGM    Microbiology:  Recent Results (from the past 240 hour(s))  Culture, blood (x  2)     Status: None (Preliminary result)   Collection Time: 10/23/18  6:49 AM  Result Value Ref Range Status   Specimen Description BLOOD RIGHT ARM  Final   Special Requests   Final    BOTTLES DRAWN AEROBIC AND ANAEROBIC Blood Culture results may not be optimal due to an excessive volume of blood received in culture bottles   Culture   Final    NO GROWTH < 24 HOURS Performed at [facility], [address], [place], [place] [address]    Report Status PENDING  Incomplete  Culture, blood (x 2)     Status: None (Preliminary result)   Collection Time: 10/23/18  6:49 AM  Result Value Ref Range Status   Specimen Description BLOOD LEFT WRIST  Final   Special Requests BOTTLES DRAWN AEROBIC AND ANAEROBIC [place]  Final   Culture   Final    NO GROWTH < 24 HOURS Performed at [facility], [address] [place], [place] [address]    Report  Status PENDING  Incomplete  MRSA PCR Screening     Status: None   Collection Time: 10/23/18  7:40 AM  Result Value Ref Range Status   MRSA by PCR NEGATIVE NEGATIVE Final    Comment:        The GeneXpert MRSA Assay (FDA approved for NASAL specimens only), is one component of a comprehensive MRSA colonization surveillance program. It is not intended to diagnose MRSA infection nor to guide or monitor treatment for MRSA infections. Performed at [facility], [address] [place], [place] [address]   SARS Coronavirus 2 [facility] order, Performed in [facility] hospital lab)     Status: None   Collection Time: 10/23/18  6:06 PM  Result Value Ref Range Status   SARS Coronavirus 2 NEGATIVE NEGATIVE Final    Comment: (NOTE) If result is NEGATIVE SARS-CoV-2 target nucleic acids are NOT DETECTED. The SARS-CoV-2 RNA is generally detectable in upper and lower  respiratory specimens during the acute phase of infection. The lowest  concentration of SARS-CoV-2 viral copies this assay can detect is 250  copies / mL. A negative result does not preclude SARS-CoV-2 infection  and should not be used as the sole basis for treatment or other  patient management decisions.  A negative result may occur with  improper specimen collection / handling, submission of specimen other  than nasopharyngeal swab, presence of viral mutation(s) within the  areas targeted by this assay, and inadequate number of viral copies  (<250 copies / mL). A negative result must be combined with clinical  observations, patient history, and epidemiological information. If result is POSITIVE SARS-CoV-2 target nucleic acids are DETECTED. The SARS-CoV-2 RNA is generally detectable in upper and lower  respiratory specimens dur ing the acute phase of infection.  Positive  results are indicative of active infection with SARS-CoV-2.  Clinical  correlation with patient history and other diagnostic information is   necessary to determine patient infection status.  Positive results do  not rule out bacterial infection or co-infection with other viruses. If result is PRESUMPTIVE POSTIVE SARS-CoV-2 nucleic acids MAY BE PRESENT.   A presumptive positive result was obtained on the submitted specimen  and confirmed on repeat testing.  While 2019 novel coronavirus  (SARS-CoV-2) nucleic acids may be present in the submitted sample  additional confirmatory testing may be necessary for epidemiological  and / or clinical management purposes  to differentiate between  SARS-CoV-2 and other Sarbecovirus currently  known to infect humans.  If clinically indicated additional testing with an alternate test  methodology [phone]) is advised. The SARS-CoV-2 RNA is generally  detectable in upper and lower respiratory sp ecimens during the acute  phase of infection. The expected result is Negative. Fact Sheet for Patients:  StrictlyIdeas.no Fact Sheet for Healthcare Providers: BankingDealers.co.za This test is not yet approved or cleared by the [place] FDA and has been authorized for detection and/or diagnosis of SARS-CoV-2 by FDA under an Emergency Use Authorization (EUA).  This EUA will remain in effect (meaning this test can be used) for the duration of the COVID-19 declaration under Section 564(b)(1) of the Act, 21 U.S.C. section 360bbb-3(b)(1), unless the authorization is terminated or revoked sooner. Performed at [facility], [address] [place], [place] [address]   Respiratory Panel by PCR     Status: None   Collection Time: 10/24/18  9:17 AM  Result Value Ref Range Status   Adenovirus NOT DETECTED NOT DETECTED Final   Coronavirus 229E NOT DETECTED NOT DETECTED Final    Comment: (NOTE) The Coronavirus on the Respiratory Panel, DOES NOT test for the novel  Coronavirus (2019 nCoV)    Coronavirus HKU1 NOT DETECTED NOT DETECTED Final    Coronavirus NL63 NOT DETECTED NOT DETECTED Final   Coronavirus OC43 NOT DETECTED NOT DETECTED Final   Metapneumovirus NOT DETECTED NOT DETECTED Final   Rhinovirus / Enterovirus NOT DETECTED NOT DETECTED Final   Influenza A NOT DETECTED NOT DETECTED Final   Influenza B NOT DETECTED NOT DETECTED Final   Parainfluenza Virus 1 NOT DETECTED NOT DETECTED Final   Parainfluenza Virus 2 NOT DETECTED NOT DETECTED Final   Parainfluenza Virus 3 NOT DETECTED NOT DETECTED Final   Parainfluenza Virus 4 NOT DETECTED NOT DETECTED Final   Respiratory Syncytial Virus NOT DETECTED NOT DETECTED Final   Bordetella pertussis NOT DETECTED NOT DETECTED Final   Chlamydophila pneumoniae NOT DETECTED NOT DETECTED Final   Mycoplasma pneumoniae NOT DETECTED NOT DETECTED Final    Comment: Performed at [facility], [place]. [address], [place], [place] [address]    Coagulation Studies: Recent Labs    10/23/18 0622  LABPROT 12.7  INR 1.0    Urinalysis: Recent Labs    10/24/18 0518  COLORURINE YELLOW*  LABSPEC 1.009  PHURINE 5.0  GLUCOSEU NEGATIVE  HGBUR SMALL*  BILIRUBINUR NEGATIVE  KETONESUR NEGATIVE  PROTEINUR 100*  NITRITE NEGATIVE  LEUKOCYTESUR NEGATIVE      Imaging: Dg Chest Port 1 View  Result Date: 10/24/2018 CLINICAL DATA:  33 year old male with acute respiratory failure EXAM: PORTABLE CHEST 1 VIEW COMPARISON:  Prior chest x-ray 10/23/2018 FINDINGS: Marked interval progression in diffuse bilateral interstitial and airspace opacities as well as a concomitant decrease in inspiratory volumes. Air bronchograms are visualized bilaterally. No pleural effusion or pneumothorax. The visualized bowel gas pattern is unremarkable. Osseous structures are intact and unremarkable. IMPRESSION: 1. Marked interval progression of diffuse bilateral interstitial and airspace opacities concerning for progression of multifocal pneumonia. Additional differential considerations include diffuse alveolar hemorrhage,  ARDS, acute pneumonitis and vaping related lung injury. Electronically Signed   By: Jacqulynn Cadet M.D.   On: 10/24/2018 08:03   Dg Chest Port 1 View  Result Date: 10/23/2018 CLINICAL DATA:  33 year old male with cough. Recent below the knee right lower extremity amputation for foot gangrene. EXAM: PORTABLE CHEST 1 VIEW COMPARISON:  09/03/2018 and earlier. FINDINGS: Portable AP upright view at 0616 hours. Patchy and indistinct bilateral pulmonary opacity with  mid and lower lung predominance is new since March. Mildly lower lung volumes. Mediastinal contours remain normal. Visualized tracheal air column is within normal limits. No pneumothorax or pleural effusion. Mild to moderate gas and fluid distension of the stomach. Negative splenic flexure. No acute osseous abnormality identified. IMPRESSION: Patchy and indistinct bilateral pulmonary opacity most suggestive of bilateral bronchopneumonia. No pleural effusion. Electronically Signed   By: Genevie Ann M.D.   On: 10/23/2018 06:33     Medications:   . sodium chloride 10 mL/hr at 10/23/18 0855  . azithromycin 500 mg (10/24/18 1827)  . famotidine (PEPCID) IV    . piperacillin-tazobactam (ZOSYN)  IV     . calcium carbonate  500 mg Oral BID WC  . enoxaparin (LOVENOX) injection  30 mg Subcutaneous Q24H  . feeding supplement (GLUCERNA SHAKE)  237 mL Oral TID BM  . fentaNYL      . ferrous sulfate  325 mg Oral BID WC  . gabapentin  300 mg Oral BID  . insulin aspart  0-9 Units Subcutaneous Q4H  . insulin glargine  25 Units Subcutaneous BID  . ipratropium-albuterol  3 mL Nebulization Q4H  . metoprolol tartrate  12.5 mg Oral BID  . midazolam      . morphine      . multivitamin with minerals  1 tablet Oral Daily  . polyethylene glycol  17 g Oral Daily  . sterile water (preservative free)      . vecuronium      . vitamin B-12  5,000 mcg Oral Daily  . ascorbic acid  250 mg Oral BID  . zolpidem  10 mg Oral QHS   sodium chloride, acetaminophen  **OR** acetaminophen, ALPRAZolam, alum & mag hydroxide-simeth, dextrose, diphenoxylate-atropine, HYDROmorphone (DILAUDID) injection, ipratropium-albuterol, magnesium hydroxide, oxyCODONE-acetaminophen **AND** oxyCODONE, promethazine  Assessment/ Plan:  33 y.o. 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  Acute kidney injury on chronic kidney disease stage III Baseline creatinine of 1.8/GFR 49 from February 2020 Acute kidney injury appears to be multifactorial Diuresing well with IV Lasix. Avoid hypotension, nephrotoxins, IV contrast Electrolytes and volume status are acceptable.  No acute indication for dialysis  2.  Acute hypoxic respiratory failure Unclear cause.  Patient is getting IV antibiotics empirically Consider acute lung injury secondary to transfusion Agree with using IV diuretics as necessary. Oxygen saturation has improved  3.  Diabetes type 1 with CKD Diabetes is poorly controlled Hemoglobin A1c 13.7% on September 20, 2018    LOS: [place] 4/21/20207:08 [facility] [place], [place]  Note: This note was prepared with Dragon dictation. Any transcription errors are unintentional

## 2018-10-24 NOTE — Progress Notes (Addendum)
Inpatient Diabetes Program Recommendations  AACE/ADA: New Consensus Statement on Inpatient Glycemic Control   Target Ranges:  Prepandial:   less than 140 mg/dL      Peak postprandial:   less than 180 mg/dL (1-2 hours)      Critically ill patients:  140 - 180 mg/dL  Results for DARRNELL, MANGIARACINA (MRN [id number]) as of 10/24/2018 16:38  Ref. Range 10/24/2018 11:05 10/24/2018 16:14 10/24/2018 16:16  Glucose-Capillary Latest Ref Range: 70 - 99 mg/dL 132 (H) 38 (LL) 42 (LL)   Results for MADDEN, GARRON (MRN [id number]) as of 10/24/2018 08:06  Ref. Range 10/23/2018 07:37 10/23/2018 11:43 10/23/2018 15:19 10/23/2018 17:18 10/23/2018 21:37 10/24/2018 07:48 10/24/2018 07:51  Glucose-Capillary Latest Ref Range: 70 - 99 mg/dL 253 (H) 190 (H) 200 (H) 182 (H) 120 (H) 38 (LL) 43 (LL)   Review of Glycemic Control  Diabetes history: DM1 (makes NO insulin;is sensitive to insulin;requires basal, correction, and meal coverage insulin) Outpatient Diabetes medications:Lantus40units BID,Novolog 4 units TID with meals Current orders for Inpatient glycemic control:Lantus25units BID, Novolog 0-9units Q4H   Inpatient Diabetes Program Recommendations:   Insulin - Basal: Noted fasting glucose of 38 mg/dl this morning and glucose 38 mg/dl at 16:14. Noted patient received Lantus 25 units at 23:13 last night and Lantus 25 units today at 15:55. May want to consider decreasing Lantus to 25 units daily (to start 10/25/18) due to recurrent hypoglycemia and patient being made NPO (per RN note at 15:22 today).  Thanks, [name], RN, MSN, CDE Diabetes Coordinator Inpatient Diabetes Program [phone] (Team Pager from 8am to 5pm)

## 2018-10-24 NOTE — Progress Notes (Signed)
Patient slept after  Given oxycodone this am for 4 hours. When he awakened he was somewhat groggy and disoriented. He immediately ask for pain medication. While awake his oxygen concentrations decreased to the 70s and 80s. Patient was alert and ask questions with his saturations in the 70s. Dr.Kasa consulted. Patient only concerned about not being able to eat or drink while on the ventilator not about breathing. Explained that without some kind of respiratory support he would die. Patient ignored nurse and watched TV and ask for pain medication. Again told patient without some respiratory support he would probably die. Patient called his dad and cried. Patient was ask to hang up phone and made NPO.Re-addressed issue with Dr. Mortimer Fries. Patient proned and gave 2 mg of Morphine. Patient slept and oxygen saturations increased to 98-100% in prone position. Patient wakened after 45 minutes and ask for ice. Patient re-proned.

## 2018-10-24 NOTE — Plan of Care (Signed)
Pt on HFNC at 90% 60L.  Aggressive pulmonary toileting.  NPO with ice chips for aspiration prevention.   Marland Kitchen

## 2018-10-24 NOTE — Progress Notes (Signed)
Date of Admission:  10/18/2018        Subjective: Pt is sedated with morphine Proned and on hiflo  Medications:  . calcium carbonate  500 mg Oral BID WC  . enoxaparin (LOVENOX) injection  30 mg Subcutaneous Q24H  . feeding supplement (GLUCERNA SHAKE)  237 mL Oral TID BM  . ferrous sulfate  325 mg Oral BID WC  . gabapentin  300 mg Oral BID  . insulin aspart  0-5 Units Subcutaneous QHS  . insulin aspart  0-9 Units Subcutaneous TID WC  . insulin aspart  4 Units Subcutaneous TID WC  . insulin glargine  25 Units Subcutaneous BID  . metoprolol tartrate  12.5 mg Oral BID  . multivitamin with minerals  1 tablet Oral Daily  . polyethylene glycol  17 g Oral Daily  . vitamin B-12  5,000 mcg Oral Daily  . ascorbic acid  250 mg Oral BID  . zolpidem  10 mg Oral QHS    Objective: Vital signs in last 24 hours: Temp:  [98.8 F (37.1 C)-99.8 F (37.7 C)] 99.2 F (37.3 C) (04/21 0800) Pulse Rate:  [97-133] 108 (04/21 1100) Resp:  [10-27] 13 (04/21 1100) BP: (113-148)/(75-99) 119/80 (04/21 1100) SpO2:  [82 %-100 %] 91 % (04/21 1100) FiO2 (%):  [100 %] 100 % (04/20 2220)  PHYSICAL EXAM:  General: sedated Lungs:b/l crepts Heart:s1s2 tachycardia Abdomen: Soft,  Extremities: rt BKA site clean and no erythema Some pitting edema both legs Skin: No rashes or lesions. Or bruising Lymph: Cervical, supraclavicular normal. Neurologic: Grossly non-focal  Lab Results Recent Labs    10/23/18 0528 10/24/18 0429 10/24/18 1101  WBC 10.9* 20.3*  --   HGB 9.8* 7.6* 7.3*  HCT 31.6* 25.1* 23.4*  NA 138 134*  --   K 5.1 4.6  --   CL 108 108  --   CO2 22 17*  --   BUN 73* 86*  --   CREATININE 3.26* 3.44*  --    Microbiology :MRSA nares neg Blood culture from 4/20 neg so far   Studies/Results:     Dg Chest Port 1 View  Result Date: 10/24/2018 CLINICAL DATA:  33 year old male with acute respiratory failure EXAM: PORTABLE CHEST 1 VIEW COMPARISON:  Prior chest x-ray 10/23/2018  FINDINGS: Marked interval progression in diffuse bilateral interstitial and airspace opacities as well as a concomitant decrease in inspiratory volumes. Air bronchograms are visualized bilaterally. No pleural effusion or pneumothorax. The visualized bowel gas pattern is unremarkable. Osseous structures are intact and unremarkable. IMPRESSION: 1. Marked interval progression of diffuse bilateral interstitial and airspace opacities concerning for progression of multifocal pneumonia. Additional differential considerations include diffuse alveolar hemorrhage, ARDS, acute pneumonitis and vaping related lung injury. Electronically Signed   By: Jacqulynn Cadet M.D.   On: 10/24/2018 08:03   Dg Chest Port 1 View  Result Date: 10/23/2018 CLINICAL DATA:  33 year old male with cough. Recent below the knee right lower extremity amputation for foot gangrene. EXAM: PORTABLE CHEST 1 VIEW COMPARISON:  09/03/2018 and earlier. FINDINGS: Portable AP upright view at 0616 hours. Patchy and indistinct bilateral pulmonary opacity with mid and lower lung predominance is new since March. Mildly lower lung volumes. Mediastinal contours remain normal. Visualized tracheal air column is within normal limits. No pneumothorax or pleural effusion. Mild to moderate gas and fluid distension of the stomach. Negative splenic flexure. No acute osseous abnormality identified. IMPRESSION: Patchy and indistinct bilateral pulmonary opacity most suggestive of bilateral bronchopneumonia. No pleural effusion. Electronically Signed  By: Genevie Ann M.D.   On: 10/23/2018 06:33     Assessment/Plan: 33 y.o. malewith a history of IDDM, CKD, B/L Diabetic foot infection , osteomyelitis s/p amputation of toes,was admitted fro Rt BKA and had it on 4/15 Hypoxic  ? Acute hypoxia with b/l worsening infiltrate  New fever in the hospital with b/l infiltrate lungs HCAP VS non infectious cause like pulmonary edema, ARDS from other causes ( fat emboli less  likely) ??? DAH ? He has gained 13 kgs since his admission  BKA site healthy  No evidence for cdiff  on vanco and zosyn- became of keflex allergy cefepime not started-  May be able to give cefepime as it has a different side chain from keflex Also on  azithromycin COVID negative  Rt BKA-observe amputation site closely  AKI on CKD  DM-poorly controlled on insulin  Discussed the management with intensivist

## 2018-10-24 NOTE — Progress Notes (Signed)
Nutrition Follow-up  RD working remotely.  DOCUMENTATION CODES:   Severe malnutrition in context of chronic illness  INTERVENTION:  Continue Glucerna Shake po TID, each supplement provides 220 kcal and 10 grams of protein. Strongly encourage patient to drink oral nutrition supplements to meet calorie/protein needs for healing.  Continue vitamin C 250 mg BID.  NUTRITION DIAGNOSIS:   Severe Malnutrition related to chronic illness(uncontrolled DM) as evidenced by moderate fat depletion, severe fat depletion, moderate muscle depletion, severe muscle depletion.  Ongoing - addressing with nutrition interventions.   GOAL:   Patient will meet greater than or equal to 90% of their needs  Progressing.  MONITOR:   PO intake, Supplement acceptance, Weight trends, Labs, Skin, I & O's  REASON FOR ASSESSMENT:   Malnutrition Screening Tool    ASSESSMENT:   33 y.o. 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 On 4/20 patient had progressive hypoxia with acute bilateral infiltrates and transferred to SD unit. Plan per chart is for chest PT and proning. Per chart patient ate 100% of meals yesterday.   Medications reviewed and include: calcium carbonate 500 mg BID, Glucerna TID, ferrous sulfate 325 mg BID, gabapentin, Novolog0-9 units TID, Novolog 0-5 units QHS, Novolog 4 units TID with meals, Lantus 25 units BID, MVI daily, Miralax 17 grams daily, vitamin B12 5000 micrograms, vitamin C 250 mg BID, Ambien, azithromycin, vancomycin.  Labs reviewed: CBG 38-148, Sodium 134, CO2 17, BUN 86, Creatinine 3.44.  Diet Order:   Diet Order            Diet Carb Modified Fluid consistency: Thin; Room service appropriate? Yes  Diet effective now             EDUCATION NEEDS:   Not appropriate for education at this time  Skin:  Skin Assessment: Reviewed RN  Assessment(incision s/p R BKA, Stage II sacrum)  Last BM:  10/22/2018 - large type 6  Height:   Ht Readings from Last 1 Encounters:  10/18/18 5\' 6"  (1.676 m)   Weight:   Wt Readings from Last 1 Encounters:  10/23/18 65.3 kg   Ideal Body Weight:  60.6 kg(adjusted for BKA)  BMI:  Body mass index is 23.24 kg/m.  Estimated Nutritional Needs:   Kcal:  1900-2200kcal/day   Protein:  95-110  Fluid:  >1.5L/day   Willey Blade, MS, RD, LDN Office: [phone] Pager: [phone] After Hours/Weekend Pager: [phone]

## 2018-10-24 NOTE — Consult Note (Signed)
Name: Shawn [name] MRN: [id number] DOB: [birth year]     CONSULTATION DATE: 10/24/2018  REFERRING MD : patel  CHIEF COMPLAINT: hypoxia   HISTORY OF PRESENT ILLNESS: Progressive hypoxia fio2 at 80% Increased WOB Alert and awake Remains criciically ill  SIGNIFICANT EVENTS 4/8 assesses by [facility] surgery-limb threatening ischemia 4/15  right leg gangrene s/p right below-the-knee amputation Treated with PCA for pain, control FSBS 4/20 progressive hypoxia with acute b/l infiltrates then transferred to SD for further care and management   Review of Systems:  Gen:  Denies  fever, sweats, chills weigh loss  HEENT: Denies blurred vision, double vision, ear pain, eye pain, hearing loss, nose bleeds, sore throat Cardiac:  No dizziness, chest pain or heaviness, chest tightness,edema, No JVD Resp:   No cough, -sputum production, +shortness of breath,-wheezing, -hemoptysis,  Gi: Denies swallowing difficulty, stomach pain, nausea or vomiting, diarrhea, constipation, bowel incontinence Gu:  Denies bladder incontinence, burning urine Ext:   Denies Joint pain, stiffness or swelling Skin: Denies  skin rash, easy bruising or bleeding or hives Endoc:  Denies polyuria, polydipsia , polyphagia or weight change Psych:   Denies depression, insomnia or hallucinations  Other:  All other systems negative   VITAL SIGNS: Temp:  [98.8 F (37.1 C)-99.8 F (37.7 C)] 99.5 F (37.5 C) (04/21 0000) Pulse Rate:  [97-133] 100 (04/21 0400) Resp:  [10-23] 11 (04/21 0400) BP: (94-148)/(75-99) 117/75 (04/21 0400) SpO2:  [82 %-100 %] 94 % (04/21 0400) FiO2 (%):  [100 %] 100 % (04/20 2220)   PHYSICAL EXAMINATION:  GENERAL:critically ill appearing, +resp distress HEAD: Normocephalic, atraumatic.  EYES: Pupils equal, round, reactive to light.  No scleral icterus.  MOUTH: Moist mucosal membrane. NECK: Supple. No thyromegaly. No nodules. No JVD.  PULMONARY: +rhonchi,  CARDIOVASCULAR: S1 and S2. Regular  rate and rhythm. No murmurs, rubs, or gallops.  GASTROINTESTINAL: Soft, nontender, -distended. No masses. Positive bowel sounds. No hepatosplenomegaly.  MUSCULOSKELETAL: No swelling, clubbing, or edema.  NEUROLOGIC: alert and awake SKIN:intact,warm,dry RT BKA incision dry/intact    CULTURE RESULTS   Recent Results (from the past 240 hour(s))  Culture, blood (x 2)     Status: None (Preliminary result)   Collection Time: 10/23/18  6:49 AM  Result Value Ref Range Status   Specimen Description BLOOD RIGHT ARM  Final   Special Requests   Final    BOTTLES DRAWN AEROBIC AND ANAEROBIC Blood Culture results may not be optimal due to an excessive volume of blood received in culture bottles   Culture   Final    NO GROWTH < 24 HOURS Performed at [facility], [address], [place], [place] [address]    Report Status PENDING  Incomplete  Culture, blood (x 2)     Status: None (Preliminary result)   Collection Time: 10/23/18  6:49 AM  Result Value Ref Range Status   Specimen Description BLOOD LEFT WRIST  Final   Special Requests BOTTLES DRAWN AEROBIC AND ANAEROBIC [place]  Final   Culture   Final    NO GROWTH < 24 HOURS Performed at [facility], [address] [place], [place] [address]    Report Status PENDING  Incomplete  MRSA PCR Screening     Status: None   Collection Time: 10/23/18  7:40 AM  Result Value Ref Range Status   MRSA by PCR NEGATIVE NEGATIVE Final    Comment:        The GeneXpert MRSA Assay (FDA approved for NASAL specimens only), is  one component of a comprehensive MRSA colonization surveillance program. It is not intended to diagnose MRSA infection nor to guide or monitor treatment for MRSA infections. Performed at [facility], [address] [place], [place] [address]   SARS Coronavirus 2 [facility] order, Performed in [facility] hospital lab)     Status: None   Collection Time: 10/23/18  6:06 PM  Result Value Ref Range Status    SARS Coronavirus 2 NEGATIVE NEGATIVE Final    Comment: (NOTE) If result is NEGATIVE SARS-CoV-2 target nucleic acids are NOT DETECTED. The SARS-CoV-2 RNA is generally detectable in upper and lower  respiratory specimens during the acute phase of infection. The lowest  concentration of SARS-CoV-2 viral copies this assay can detect is 250  copies / mL. A negative result does not preclude SARS-CoV-2 infection  and should not be used as the sole basis for treatment or other  patient management decisions.  A negative result may occur with  improper specimen collection / handling, submission of specimen other  than nasopharyngeal swab, presence of viral mutation(s) within the  areas targeted by this assay, and inadequate number of viral copies  (<250 copies / mL). A negative result must be combined with clinical  observations, patient history, and epidemiological information. If result is POSITIVE SARS-CoV-2 target nucleic acids are DETECTED. The SARS-CoV-2 RNA is generally detectable in upper and lower  respiratory specimens dur ing the acute phase of infection.  Positive  results are indicative of active infection with SARS-CoV-2.  Clinical  correlation with patient history and other diagnostic information is  necessary to determine patient infection status.  Positive results do  not rule out bacterial infection or co-infection with other viruses. If result is PRESUMPTIVE POSTIVE SARS-CoV-2 nucleic acids MAY BE PRESENT.   A presumptive positive result was obtained on the submitted specimen  and confirmed on repeat testing.  While 2019 novel coronavirus  (SARS-CoV-2) nucleic acids may be present in the submitted sample  additional confirmatory testing may be necessary for epidemiological  and / or clinical management purposes  to differentiate between  SARS-CoV-2 and other Sarbecovirus currently known to infect humans.  If clinically indicated additional testing with an alternate test   methodology [phone]) is advised. The SARS-CoV-2 RNA is generally  detectable in upper and lower respiratory sp ecimens during the acute  phase of infection. The expected result is Negative. Fact Sheet for Patients:  StrictlyIdeas.no Fact Sheet for Healthcare Providers: BankingDealers.co.za This test is not yet approved or cleared by the [place] FDA and has been authorized for detection and/or diagnosis of SARS-CoV-2 by FDA under an Emergency Use Authorization (EUA).  This EUA will remain in effect (meaning this test can be used) for the duration of the COVID-19 declaration under Section 564(b)(1) of the Act, 21 U.S.C. section 360bbb-3(b)(1), unless the authorization is terminated or revoked sooner. Performed at [facility], [address], [place],  [address]           IMAGING    No results found.           I have Independently reviewed images of  CXR   on 10/24/2018 Interpretation:progressive bilateral infiltrates  ECHO 06/2018 EF 16% Grade 1 diastolic dysfunction  ASSESSMENT AND PLAN SYNOPSIS  33 yo with Brittle DM with lower ext leg ischemia s/p BKA With progressive hypoxic resp failure with acute b/l Interstitial infiltrates with low grade fevers and elevated WBC count complicated by progressive renal failure  Wean fio2 as tolerated  continue high flow [place] adult tummy time and proning  Continue IV abx as prescribed Follow up ID consultation    Critical Care Time devoted to patient care services described in this note is 36 minutes.   Overall, patient is critically ill, prognosis is guarded.  Patient with Multiorgan failure   Corrin Parker, M.D.  [facility]  Medical Director [place] Director [facility] Cardio-Pulmonary Department

## 2018-10-24 NOTE — TOC Progression Note (Signed)
Transition of Care [facility]) - Progression Note    Patient Details  Name: Shawn [name] MRN: [id number] Date of Birth: 01-02-1986  Transition of Care [facility] CM/SW Contact  Shelbie Hutching, RN Phone Number: 10/24/2018, 10:32 AM  Clinical Narrative:    Patient transferred to ICU yesterday for increased work of breathing and decreasing oxygen sats requiring HFNC.  Patient is currently on HFNC at 75% FIO2.     Expected Discharge Plan: [place] Barriers to Discharge: No Barriers Identified  Expected Discharge Plan and Services Expected Discharge Plan: [place] arrangements for the past 2 months: Single Family Home Expected Discharge Date: 10/20/18                         Social Determinants of Health (SDOH) Interventions    Readmission Risk Interventions Readmission Risk Prevention Plan 10/13/2018 09/21/2018 09/21/2018  Transportation Screening Complete Complete Complete  Medication Review Press photographer) Complete Complete Complete  PCP or Specialist appointment within 3-5 days of discharge Complete Complete Complete  HRI or Home Care Consult Complete Complete -  SW Recovery Care/Counseling Consult (No Data) Complete -  Palliative Care Screening Not Applicable Not Applicable Not [place] Not Applicable Not Applicable -  Some recent data might be hidden

## 2018-10-25 ENCOUNTER — Inpatient Hospital Stay (HOSPITAL_COMMUNITY)
Admission: RE | Admit: 2018-10-25 | Discharge: 2018-10-25 | Disposition: A | Discharge status: Home / Self Care | Payer: Medicaid Other | Source: Home / Self Care | Attending: Internal Medicine | Admitting: Internal Medicine

## 2018-10-25 ENCOUNTER — Inpatient Hospital Stay: Payer: Medicaid Other

## 2018-10-25 DIAGNOSIS — I340 Nonrheumatic mitral (valve) insufficiency: Secondary | ICD-10-CM

## 2018-10-25 DIAGNOSIS — I739 Peripheral vascular disease, unspecified: Secondary | ICD-10-CM

## 2018-10-25 DIAGNOSIS — Z8739 Personal history of other diseases of the musculoskeletal system and connective tissue: Secondary | ICD-10-CM

## 2018-10-25 DIAGNOSIS — A419 Sepsis, unspecified organism: Secondary | ICD-10-CM

## 2018-10-25 DIAGNOSIS — Z9889 Other specified postprocedural states: Secondary | ICD-10-CM

## 2018-10-25 DIAGNOSIS — R918 Other nonspecific abnormal finding of lung field: Secondary | ICD-10-CM

## 2018-10-25 DIAGNOSIS — Z9911 Dependence on respirator [ventilator] status: Secondary | ICD-10-CM

## 2018-10-25 LAB — BASIC METABOLIC PANEL
Anion gap: 10 (ref 5–15)
BUN: 74 mg/dL — ABNORMAL HIGH (ref 6–20)
CO2: 18 mmol/L — ABNORMAL LOW (ref 22–32)
Calcium: 8 mg/dL — ABNORMAL LOW (ref 8.9–10.3)
Chloride: 108 mmol/L (ref 98–111)
Creatinine, Ser: 3.29 mg/dL — ABNORMAL HIGH (ref 0.61–1.24)
GFR calc Af Amer: 27 mL/min — ABNORMAL LOW (ref >60)
GFR calc non Af Amer: 24 mL/min — ABNORMAL LOW (ref >60)
Glucose, Bld: 80 mg/dL (ref 70–99)
Potassium: 4 mmol/L (ref 3.5–5.1)
Sodium: 136 mmol/L (ref 135–145)

## 2018-10-25 LAB — CBC
HCT: 24.4 % — ABNORMAL LOW (ref 39.0–52.0)
Hemoglobin: 7.6 g/dL — ABNORMAL LOW (ref 13.0–17.0)
MCH: 27.8 pg (ref 26.0–34.0)
MCHC: 31.1 g/dL (ref 30.0–36.0)
MCV: 89.4 fL (ref 80.0–100.0)
Platelets: 320 10*3/uL (ref 150–400)
RBC: 2.73 MIL/uL — ABNORMAL LOW (ref 4.22–5.81)
RDW: 14.1 % (ref 11.5–15.5)
WBC: 22.8 10*3/uL — ABNORMAL HIGH (ref 4.0–10.5)
nRBC: 0 % (ref 0.0–0.2)

## 2018-10-25 LAB — LEGIONELLA PNEUMOPHILA SEROGP 1 UR AG: L. pneumophila Serogp 1 Ur Ag: NEGATIVE

## 2018-10-25 LAB — GLUCOSE, CAPILLARY
Glucose-Capillary: 148 mg/dL — ABNORMAL HIGH (ref 70–99)
Glucose-Capillary: 174 mg/dL — ABNORMAL HIGH (ref 70–99)
Glucose-Capillary: 179 mg/dL — ABNORMAL HIGH (ref 70–99)
Glucose-Capillary: 193 mg/dL — ABNORMAL HIGH (ref 70–99)
Glucose-Capillary: 62 mg/dL — ABNORMAL LOW (ref 70–99)
Glucose-Capillary: 63 mg/dL — ABNORMAL LOW (ref 70–99)
Glucose-Capillary: 83 mg/dL (ref 70–99)
Glucose-Capillary: 97 mg/dL (ref 70–99)

## 2018-10-25 LAB — ECHOCARDIOGRAM COMPLETE
Height: 66 in
Weight: 2304 oz

## 2018-10-25 LAB — PROTEIN / CREATININE RATIO, URINE
Creatinine, Urine: 49 mg/dL
Protein Creatinine Ratio: 4.59 mg/mg{Cre} — ABNORMAL HIGH (ref 0.00–0.15)
Total Protein, Urine: 225 mg/dL

## 2018-10-25 MED ORDER — DEXTROSE 50 % IV SOLN
12.5000 g | Freq: Once | INTRAVENOUS | Status: AC
  Administered 2018-10-25: 08:00:00 12.5 g via INTRAVENOUS

## 2018-10-25 MED ORDER — INSULIN GLARGINE 100 UNIT/ML ~~LOC~~ SOLN
12.0000 [IU] | Freq: Two times a day (BID) | SUBCUTANEOUS | Status: DC
  Administered 2018-10-25 – 2018-10-26 (×3): 12 [IU] via SUBCUTANEOUS
  Filled 2018-10-25 (×4): qty 0.12

## 2018-10-25 MED ORDER — FUROSEMIDE 10 MG/ML IJ SOLN
4.0000 mg/h | INTRAVENOUS | Status: DC
  Administered 2018-10-25: 4 mg/h via INTRAVENOUS
  Filled 2018-10-25 (×2): qty 25

## 2018-10-25 NOTE — Progress Notes (Signed)
[facility], [place] 10/25/18  Subjective:   Patient was originally admitted for right leg gangrene and he underwent right below the knee amputation on April 15 by Dr. Delana Meyer.  Postop course complicated by anemia requiring blood transfusion.  Hospital course is further complicated by acute  hypoxia with worsening bilateral infiltrate.  This morning, patient is again noted to be SOB. He able to take PO He is tachypneic and tachycardic Asking for dilaudid  No leg edema CXR still with infiltrates Cr slightly improved at 3.29 (3.44) UOP 1500 cc (foley) More clear urine in the foley bag  Objective:  Vital signs in last 24 hours:  Temp:  [97.8 F (36.6 C)-99.3 F (37.4 C)] 98.8 F (37.1 C) (04/22 0200) Pulse Rate:  [86-127] 111 (04/22 0807) Resp:  [11-30] 15 (04/22 0807) BP: (91-150)/(59-104) 118/75 (04/22 0600) SpO2:  [79 %-100 %] 100 % (04/22 0807) FiO2 (%):  [75 %-95 %] 80 % (04/22 0807)  Weight change:  Filed Weights   10/18/18 0916 10/19/18 0500 10/23/18 0541  Weight: 52.7 kg 52.9 kg 65.3 kg    Intake/Output:    Intake/Output Summary (Last 24 hours) at 10/25/2018 0849 Last data filed at 10/25/2018 0600 Gross per 24 hour  Intake 1174.73 ml  Output 1500 ml  Net -325.27 ml     Physical Exam: General:  Chronically ill-appearing, thin  HEENT  anicteric, dry oral mucous membranes  Neck  supple  Pulm/lungs  bilateral diffuse crackles, high flow oxygen by nasal cannula  CVS/Heart  tachycardic, regular  Abdomen:   Soft, nontender  Extremities:  Right BKA, no edema on left  Neurologic:  Alert, able to follow commands and answer questions  Skin:  No acute rashes    Foley in place       Basic Metabolic Panel:  Recent Labs  Lab 10/20/18 0559 10/21/18 0440 10/22/18 0531 10/23/18 0528 10/24/18 0429 10/25/18 0320  NA 135 133* 131* 138 134* 136  K 3.6 4.0 4.6 5.1 4.6 4.0  CL 106 103 101 108 108 108  CO2 21* 23 21* 22 17* 18*  GLUCOSE  81 109* 109* 224* 81 80  BUN 39* 55* 66* 73* 86* 74*  CREATININE 2.03* 2.78* 2.83* 3.26* 3.44* 3.29*  CALCIUM 8.2* 7.8* 8.1* 8.3* 7.7* 8.0*  MG 1.7 1.9 2.2 2.1 2.0  --      CBC: Recent Labs  Lab 10/18/18 0857  10/21/18 0440 10/22/18 0531 10/23/18 0528 10/24/18 0429 10/24/18 1101 10/25/18 0320  WBC 10.6*   < > 14.1* 15.3* 10.9* 20.3*  --  22.8*  NEUTROABS 7.8*  --   --   --   --   --   --   --   HGB 8.5*   < > 6.6* 9.4* 9.8* 7.6* 7.3* 7.6*  HCT 26.1*   < > 21.2* 28.9* 31.6* 25.1* 23.4* 24.4*  MCV 84.5   < > 86.5 86.3 88.5 91.6  --  89.4  PLT 345   < > 224 252 311 264  --  320   < > = values in this interval not displayed.     No results found for: HEPBSAG, HEPBSAB, HEPBIGM    Microbiology:  Recent Results (from the past 240 hour(s))  Culture, blood (x 2)     Status: None (Preliminary result)   Collection Time: 10/23/18  6:49 AM  Result Value Ref Range Status   Specimen Description BLOOD RIGHT ARM  Final   Special Requests   Final  BOTTLES DRAWN AEROBIC AND ANAEROBIC Blood Culture results may not be optimal due to an excessive volume of blood received in culture bottles   Culture   Final    NO GROWTH 2 DAYS Performed at [facility], [address] [place], [place] [address]    Report Status PENDING  Incomplete  Culture, blood (x 2)     Status: None (Preliminary result)   Collection Time: 10/23/18  6:49 AM  Result Value Ref Range Status   Specimen Description BLOOD LEFT WRIST  Final   Special Requests BOTTLES DRAWN AEROBIC AND ANAEROBIC [place]  Final   Culture   Final    NO GROWTH 2 DAYS Performed at [facility], [address] [place], [place] [address]    Report Status PENDING  Incomplete  MRSA PCR Screening     Status: None   Collection Time: 10/23/18  7:40 AM  Result Value Ref Range Status   MRSA by PCR NEGATIVE NEGATIVE Final    Comment:        The GeneXpert MRSA Assay (FDA approved for NASAL specimens only), is one component of  a comprehensive MRSA colonization surveillance program. It is not intended to diagnose MRSA infection nor to guide or monitor treatment for MRSA infections. Performed at [facility], [address] [place], [place] [address]   SARS Coronavirus 2 [facility] order, Performed in [facility] hospital lab)     Status: None   Collection Time: 10/23/18  6:06 PM  Result Value Ref Range Status   SARS Coronavirus 2 NEGATIVE NEGATIVE Final    Comment: (NOTE) If result is NEGATIVE SARS-CoV-2 target nucleic acids are NOT DETECTED. The SARS-CoV-2 RNA is generally detectable in upper and lower  respiratory specimens during the acute phase of infection. The lowest  concentration of SARS-CoV-2 viral copies this assay can detect is 250  copies / mL. A negative result does not preclude SARS-CoV-2 infection  and should not be used as the sole basis for treatment or other  patient management decisions.  A negative result may occur with  improper specimen collection / handling, submission of specimen other  than nasopharyngeal swab, presence of viral mutation(s) within the  areas targeted by this assay, and inadequate number of viral copies  (<250 copies / mL). A negative result must be combined with clinical  observations, patient history, and epidemiological information. If result is POSITIVE SARS-CoV-2 target nucleic acids are DETECTED. The SARS-CoV-2 RNA is generally detectable in upper and lower  respiratory specimens dur ing the acute phase of infection.  Positive  results are indicative of active infection with SARS-CoV-2.  Clinical  correlation with patient history and other diagnostic information is  necessary to determine patient infection status.  Positive results do  not rule out bacterial infection or co-infection with other viruses. If result is PRESUMPTIVE POSTIVE SARS-CoV-2 nucleic acids MAY BE PRESENT.   A presumptive positive result was obtained on the submitted specimen   and confirmed on repeat testing.  While 2019 novel coronavirus  (SARS-CoV-2) nucleic acids may be present in the submitted sample  additional confirmatory testing may be necessary for epidemiological  and / or clinical management purposes  to differentiate between  SARS-CoV-2 and other Sarbecovirus currently known to infect humans.  If clinically indicated additional testing with an alternate test  methodology [phone]) is advised. The SARS-CoV-2 RNA is generally  detectable in upper and lower respiratory sp ecimens during the acute  phase of infection. The expected result is  Negative. Fact Sheet for Patients:  StrictlyIdeas.no Fact Sheet for Healthcare Providers: BankingDealers.co.za This test is not yet approved or cleared by the [place] FDA and has been authorized for detection and/or diagnosis of SARS-CoV-2 by FDA under an Emergency Use Authorization (EUA).  This EUA will remain in effect (meaning this test can be used) for the duration of the COVID-19 declaration under Section 564(b)(1) of the Act, 21 U.S.C. section 360bbb-3(b)(1), unless the authorization is terminated or revoked sooner. Performed at [facility], [address] [place], [place] [address]   Respiratory Panel by PCR     Status: None   Collection Time: 10/24/18  9:17 AM  Result Value Ref Range Status   Adenovirus NOT DETECTED NOT DETECTED Final   Coronavirus 229E NOT DETECTED NOT DETECTED Final    Comment: (NOTE) The Coronavirus on the Respiratory Panel, DOES NOT test for the novel  Coronavirus (2019 nCoV)    Coronavirus HKU1 NOT DETECTED NOT DETECTED Final   Coronavirus NL63 NOT DETECTED NOT DETECTED Final   Coronavirus OC43 NOT DETECTED NOT DETECTED Final   Metapneumovirus NOT DETECTED NOT DETECTED Final   Rhinovirus / Enterovirus NOT DETECTED NOT DETECTED Final   Influenza A NOT DETECTED NOT DETECTED Final   Influenza B NOT DETECTED NOT  DETECTED Final   Parainfluenza Virus 1 NOT DETECTED NOT DETECTED Final   Parainfluenza Virus 2 NOT DETECTED NOT DETECTED Final   Parainfluenza Virus 3 NOT DETECTED NOT DETECTED Final   Parainfluenza Virus 4 NOT DETECTED NOT DETECTED Final   Respiratory Syncytial Virus NOT DETECTED NOT DETECTED Final   Bordetella pertussis NOT DETECTED NOT DETECTED Final   Chlamydophila pneumoniae NOT DETECTED NOT DETECTED Final   Mycoplasma pneumoniae NOT DETECTED NOT DETECTED Final    Comment: Performed at [facility], [place]. [address] [place], [place] [address]    Coagulation Studies: Recent Labs    10/23/18 0622  LABPROT 12.7  INR 1.0    Urinalysis: Recent Labs    10/24/18 0518  COLORURINE YELLOW*  LABSPEC 1.009  PHURINE 5.0  GLUCOSEU NEGATIVE  HGBUR SMALL*  BILIRUBINUR NEGATIVE  KETONESUR NEGATIVE  PROTEINUR 100*  NITRITE NEGATIVE  LEUKOCYTESUR NEGATIVE      Imaging: Dg Chest Port 1 View  Result Date: 10/25/2018 CLINICAL DATA:  Respiratory failure. Follow-up pneumonia and/or ARDS. EXAM: PORTABLE CHEST 1 VIEW COMPARISON:  10/24/2018 and earlier. FINDINGS: Since the examination yesterday, improvement in aeration throughout both lungs, though interstitial and airspace opacities persist diffusely throughout both lungs. No new pulmonary parenchymal abnormalities. Cardiomediastinal silhouette unremarkable and unchanged. IMPRESSION: Improved aeration throughout both lungs since yesterday, though moderate pneumonia and/or ARDS persists diffusely throughout both lungs. No new abnormalities. Electronically Signed   By: Evangeline Dakin M.D.   On: 10/25/2018 08:07   Dg Chest Port 1 View  Result Date: 10/24/2018 CLINICAL DATA:  33 year old male with acute respiratory failure EXAM: PORTABLE CHEST 1 VIEW COMPARISON:  Prior chest x-ray 10/23/2018 FINDINGS: Marked interval progression in diffuse bilateral interstitial and airspace opacities as well as a concomitant decrease in inspiratory  volumes. Air bronchograms are visualized bilaterally. No pleural effusion or pneumothorax. The visualized bowel gas pattern is unremarkable. Osseous structures are intact and unremarkable. IMPRESSION: 1. Marked interval progression of diffuse bilateral interstitial and airspace opacities concerning for progression of multifocal pneumonia. Additional differential considerations include diffuse alveolar hemorrhage, ARDS, acute pneumonitis and vaping related lung injury. Electronically Signed   By: Jacqulynn Cadet M.D.   On: 10/24/2018 08:03  Medications:   . sodium chloride Stopped (10/25/18 0514)  . azithromycin Stopped (10/24/18 1927)  . dextrose 40 mL/hr at 10/25/18 0600  . famotidine (PEPCID) IV Stopped (10/24/18 2207)  . piperacillin-tazobactam (ZOSYN)  IV 12.5 mL/hr at 10/25/18 0600   . calcium carbonate  500 mg Oral BID WC  . enoxaparin (LOVENOX) injection  30 mg Subcutaneous Q24H  . feeding supplement (GLUCERNA SHAKE)  237 mL Oral TID BM  . ferrous sulfate  325 mg Oral BID WC  . gabapentin  300 mg Oral BID  . insulin aspart  0-9 Units Subcutaneous Q4H  . insulin glargine  25 Units Subcutaneous BID  . ipratropium-albuterol  3 mL Nebulization Q4H  . mouth rinse  15 mL Mouth Rinse BID  . metoprolol tartrate  12.5 mg Oral BID  . multivitamin with minerals  1 tablet Oral Daily  . polyethylene glycol  17 g Oral Daily  . vitamin B-12  5,000 mcg Oral Daily  . ascorbic acid  250 mg Oral BID  . zolpidem  10 mg Oral QHS   sodium chloride, acetaminophen **OR** acetaminophen, ALPRAZolam, alum & mag hydroxide-simeth, dextrose, diphenoxylate-atropine, HYDROmorphone (DILAUDID) injection, ipratropium-albuterol, magnesium hydroxide, oxyCODONE-acetaminophen **AND** oxyCODONE, promethazine  Assessment/ Plan:  33 y.o. male with diabetes mellitus type I, diabetic neuropathy, depression, anxiety, status post bilateral toe amputations, hypertension, underwent right BKA on April 15, with complicated  hospital course of anemia requiring blood transfusion, hypoxia, requiring ICU monitoring and acute kidney injury. 2D echo from December 2019 shows moderate concentric hypertrophy, EF 50%, diffuse hypokinesis, grade 1 diastolic dysfunction  1.  Acute kidney injury on chronic kidney disease stage III Baseline creatinine of 1.8/GFR 49 from February 2020 Acute kidney injury appears to be multifactorial Diuresing well with IV Lasix. S Creatinine slightly improved Avoid hypotension, nephrotoxins, IV contrast Electrolytes and volume status are acceptable.  No acute indication for dialysis Check ANA, ANCA and complements  2.  Acute hypoxic respiratory failure Unclear cause.  Patient is getting IV antibiotics empirically Consider acute lung injury secondary to transfusion Consider low dose iv lasix infusion  3.  Diabetes type 1 with CKD Diabetes is poorly controlled Hemoglobin A1c 13.7% on September 20, 2018    LOS: [place] 4/22/20208:49 [place], [place]  Note: This note was prepared with Dragon dictation. Any transcription errors are unintentional

## 2018-10-25 NOTE — Progress Notes (Signed)
*  PRELIMINARY RESULTS* Echocardiogram 2D Echocardiogram has been performed.  Shawn [name] 10/25/2018, 10:51 AM

## 2018-10-25 NOTE — Progress Notes (Signed)
Patient ID: Shawn [name], male   DOB: [birth year], 33 y.o.   MRN: [id number]  [facility] PROGRESS NOTE  Shawn [name]:[id number] DOB: 02/12/1986 DOA: 10/18/2018 PCP: Valera Castle, MD  HPI/Subjective: Patient's breathing is improved   Objective: Vitals:   10/25/18 0919 10/25/18 1100  BP: (!) 152/95   Pulse: (!) 128   Resp:    Temp:    SpO2:  92%    Filed Weights   10/18/18 0916 10/19/18 0500 10/23/18 0541  Weight: 52.7 kg 52.9 kg 65.3 kg    ROS: Review of Systems  Constitutional: Negative for chills and fever.  Eyes: Negative for blurred vision.  Respiratory: Negative for cough and shortness of breath.   Cardiovascular: Negative for chest pain.  Gastrointestinal: Negative for abdominal pain, constipation, diarrhea, nausea and vomiting.  Genitourinary: Negative for dysuria.  Musculoskeletal: Positive for joint pain.  Neurological: Negative for dizziness and headaches.   Exam: Physical Exam  Constitutional: He is oriented to person, place, and time. He appears distressed.  HENT:  Nose: No mucosal edema.  Mouth/Throat: No oropharyngeal exudate or posterior oropharyngeal edema.  Eyes: Pupils are equal, round, and reactive to light. Conjunctivae, EOM and lids are normal.  Neck: No JVD present. Carotid bruit is not present. No edema present. No thyroid mass and no thyromegaly present.  Cardiovascular: S1 normal and S2 normal. Tachycardia present. Exam reveals no gallop.  No murmur heard. Pulses:      Dorsalis pedis pulses are 2+ on the left side.  Respiratory: No respiratory distress. He has no wheezes. He has no rhonchi. He has no rales.  GI: Soft. Bowel sounds are normal. There is no abdominal tenderness.  Musculoskeletal:     Right shoulder: He exhibits no swelling.  Lymphadenopathy:    He has no cervical adenopathy.  Neurological: He is alert and oriented to person, place, and time. No cranial nerve deficit.  Skin: Skin is warm. Nails show no  clubbing.  Right lower extremity amputation site covered in pressure dressing.  Left foot prior amputation site healed well.  Psychiatric: He has a normal mood and affect.      Data Reviewed: Basic Metabolic Panel: Recent Labs  Lab 10/20/18 0559 10/21/18 0440 10/22/18 0531 10/23/18 0528 10/24/18 0429 10/25/18 0320  NA 135 133* 131* 138 134* 136  K 3.6 4.0 4.6 5.1 4.6 4.0  CL 106 103 101 108 108 108  CO2 21* 23 21* 22 17* 18*  GLUCOSE 81 109* 109* 224* 81 80  BUN 39* 55* 66* 73* 86* 74*  CREATININE 2.03* 2.78* 2.83* 3.26* 3.44* 3.29*  CALCIUM 8.2* 7.8* 8.1* 8.3* 7.7* 8.0*  MG 1.7 1.9 2.2 2.1 2.0  --    CBC: Recent Labs  Lab 10/21/18 0440 10/22/18 0531 10/23/18 0528 10/24/18 0429 10/24/18 1101 10/25/18 0320  WBC 14.1* 15.3* 10.9* 20.3*  --  22.8*  HGB 6.6* 9.4* 9.8* 7.6* 7.3* 7.6*  HCT 21.2* 28.9* 31.6* 25.1* 23.4* 24.4*  MCV 86.5 86.3 88.5 91.6  --  89.4  PLT 224 252 311 264  --  320    CBG: Recent Labs  Lab 10/24/18 2034 10/25/18 0013 10/25/18 0405 10/25/18 0728 10/25/18 0805  GLUCAP 126* 63* 83 62* 97    Scheduled Meds: . calcium carbonate  500 mg Oral BID WC  . enoxaparin (LOVENOX) injection  30 mg Subcutaneous Q24H  . feeding supplement (GLUCERNA SHAKE)  237 mL Oral TID BM  . ferrous sulfate  325 mg Oral BID  WC  . gabapentin  300 mg Oral BID  . insulin aspart  0-9 Units Subcutaneous Q4H  . insulin glargine  25 Units Subcutaneous BID  . ipratropium-albuterol  3 mL Nebulization Q4H  . mouth rinse  15 mL Mouth Rinse BID  . metoprolol tartrate  12.5 mg Oral BID  . multivitamin with minerals  1 tablet Oral Daily  . polyethylene glycol  17 g Oral Daily  . vitamin B-12  5,000 mcg Oral Daily  . ascorbic acid  250 mg Oral BID  . zolpidem  10 mg Oral QHS   Continuous Infusions: . sodium chloride Stopped (10/25/18 0514)  . azithromycin Stopped (10/24/18 1927)  . dextrose 40 mL/hr at 10/25/18 0600  . famotidine (PEPCID) IV Stopped (10/24/18 2207)  .  furosemide (LASIX) infusion    . piperacillin-tazobactam (ZOSYN)  IV 12.5 mL/hr at 10/25/18 0600    Assessment/Plan:  1. Acute respiratory failure due to aspiration pneumonia requiring oxygen continue IV antibiotic, chest x-ray with improvement 2. fever due to aspiration pneumonia continue Zosyn COVID ruled out  3. postoperative anemia.  Hemoglobin stable now status post transfusion follow CBC 4. 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  Patient had right leg gangrene which was the reason for amputation.  Continue oral and IV pain medications  5. uncontrolled type 2 diabetes mellitus labile regimen being adjusted due to hypoglycemia 6. chronic kidney disease stage III.  Monitor closely. 7. Diabetic neuropathy on gabapentin 8. Constipation start MiraLAX.     Code Status:     Code Status Orders  (From admission, onward)         Start     Ordered   10/18/18 1237  Full code  Continuous     10/18/18 1242        Code Status History    Date Active Date Inactive Code Status Order ID Comments User Context   10/11/2018 1157 10/13/2018 1654 Full Code [id number]  Hillary Bow, MD Inpatient   09/19/2018 2049 09/21/2018 1851 Full Code [id number]  Saundra Shelling, MD Inpatient   08/16/2018 1203 08/18/2018 2142 Full Code [id number]  Hillary Bow, MD ED   07/16/2018 1739 07/18/2018 0104 Full Code [id number]  Dustin Flock, MD Inpatient   07/10/2018 0111 07/11/2018 1955 Full Code [id number]  Salary, Avel Peace, MD Inpatient   06/14/2018 1744 06/20/2018 2123 Full Code [id number]  Loletha Grayer, MD ED   05/28/2018 0437 05/28/2018 1926 Full Code [id number]  Arta Silence, MD ED   04/27/2018 1726 05/02/2018 1713 Full Code [id number]  Henreitta Leber, MD Inpatient   04/05/2018 1724 04/11/2018 2142 Full Code [id number]  Dustin Flock, MD Inpatient   02/06/2018 2022 02/07/2018 1512 Full Code [id number]  Demetrios Loll, MD Inpatient   01/10/2018 2259 01/11/2018 1947 Full Code [id number]  Amelia Jo, MD Inpatient   12/30/2017 0019 01/03/2018 1457 Full Code [id number]  Harrie Foreman, MD Inpatient   12/20/2017 1541 12/27/2017 2147 Full Code [id number]  Sharlotte Alamo, [facility] Inpatient   11/10/2017 0049 11/11/2017 1632 Full Code [id number]  Amelia Jo, MD Inpatient   07/13/2017 2112 07/15/2017 1730 Full Code [id number]  Jules Husbands, MD ED      Disposition Plan: To be determined  Consultants:  Vascular surgery  Procedures:  Right leg below-knee amputation  Time spent: 24min critical care time  [employer]

## 2018-10-25 NOTE — Progress Notes (Signed)
Name: Shawn [name] MRN: [id number] DOB: 1985/07/23     CONSULTATION DATE: 10/25/2018  REFERRING MD : patel  CHIEF COMPLAINT:follow up hypoxia   HISTORY OF PRESENT ILLNESS: 4/16 1 unit PRBC transfused 4/18 2 units PRBC's transfused   possibility of transfusion associated circulatory overload (TACO) Remains hypoxic  fio2 at 80% Still SOB  Remains alert and awake Remains critically ill appearing        SIGNIFICANT EVENTS 4/8 assesses by [facility] surgery-limb threatening ischemia 4/15  right leg gangrene s/p right below-the-knee amputation Treated with PCA for pain, control FSBS 4/20 progressive hypoxia with acute b/l infiltrates then transferred to [place] for further care and management    Review of Systems:  Gen:  Denies  fever, sweats, chills weigh loss  HEENT: Denies blurred vision, double vision, ear pain, eye pain, hearing loss, nose bleeds, sore throat Cardiac:  No dizziness, chest pain or heaviness, chest tightness,edema, No JVD Resp:   +shortness of breath Gi: Denies swallowing difficulty, stomach pain, nausea or vomiting, diarrhea, constipation, bowel incontinence Gu:  Denies bladder incontinence, burning urine Ext:   Denies Joint pain, stiffness or swelling Skin: Denies  skin rash, easy bruising or bleeding or hives Endoc:  Denies polyuria, polydipsia , polyphagia or weight change Psych:   Denies depression, insomnia or hallucinations  Other:  All other systems negative    VITAL SIGNS: Temp:  [97.8 F (36.6 C)-99.3 F (37.4 C)] 98.8 F (37.1 C) (04/22 0200) Pulse Rate:  [86-127] 111 (04/22 0807) Resp:  [11-30] 15 (04/22 0807) BP: (91-150)/(59-104) 118/75 (04/22 0600) SpO2:  [79 %-100 %] 100 % (04/22 0807) FiO2 (%):  [75 %-95 %] 80 % (04/22 0807)   PHYSICAL EXAMINATION:  GENERAL:critically ill appearing, +resp distress HEAD: Normocephalic, atraumatic.  EYES: Pupils equal, round, reactive to light.  No scleral icterus.  MOUTH: Moist mucosal  membrane. NECK: Supple. No thyromegaly. No nodules. No JVD.  PULMONARY: +rhonchi, +wheezing CARDIOVASCULAR: S1 and S2. Regular rate and rhythm. No murmurs, rubs, or gallops.  GASTROINTESTINAL: Soft, nontender, -distended. No masses. Positive bowel sounds. No hepatosplenomegaly.  MUSCULOSKELETAL: No swelling, clubbing, or edema.  NEUROLOGIC: alert and awake SKIN:intact,warm,dry      CULTURE RESULTS   Recent Results (from the past 240 hour(s))  Culture, blood (x 2)     Status: None (Preliminary result)   Collection Time: 10/23/18  6:49 AM  Result Value Ref Range Status   Specimen Description BLOOD RIGHT ARM  Final   Special Requests   Final    BOTTLES DRAWN AEROBIC AND ANAEROBIC Blood Culture results may not be optimal due to an excessive volume of blood received in culture bottles   Culture   Final    NO GROWTH 2 DAYS Performed at [facility], [address] [place], [place] [address]    Report Status PENDING  Incomplete  Culture, blood (x 2)     Status: None (Preliminary result)   Collection Time: 10/23/18  6:49 AM  Result Value Ref Range Status   Specimen Description BLOOD LEFT WRIST  Final   Special Requests BOTTLES DRAWN AEROBIC AND ANAEROBIC [place]  Final   Culture   Final    NO GROWTH 2 DAYS Performed at [facility], [address], [place], [place] [address]    Report Status PENDING  Incomplete  MRSA PCR Screening     Status: None   Collection Time: 10/23/18  7:40 AM  Result Value Ref Range Status   MRSA by PCR NEGATIVE NEGATIVE  Final    Comment:        The GeneXpert MRSA Assay (FDA approved for NASAL specimens only), is one component of a comprehensive MRSA colonization surveillance program. It is not intended to diagnose MRSA infection nor to guide or monitor treatment for MRSA infections. Performed at [facility], [address] [place], [place] [address]   SARS Coronavirus 2 [facility] order, Performed in [facility]  hospital lab)     Status: None   Collection Time: 10/23/18  6:06 PM  Result Value Ref Range Status   SARS Coronavirus 2 NEGATIVE NEGATIVE Final    Comment: (NOTE) If result is NEGATIVE SARS-CoV-2 target nucleic acids are NOT DETECTED. The SARS-CoV-2 RNA is generally detectable in upper and lower  respiratory specimens during the acute phase of infection. The lowest  concentration of SARS-CoV-2 viral copies this assay can detect is 250  copies / mL. A negative result does not preclude SARS-CoV-2 infection  and should not be used as the sole basis for treatment or other  patient management decisions.  A negative result may occur with  improper specimen collection / handling, submission of specimen other  than nasopharyngeal swab, presence of viral mutation(s) within the  areas targeted by this assay, and inadequate number of viral copies  (<250 copies / mL). A negative result must be combined with clinical  observations, patient history, and epidemiological information. If result is POSITIVE SARS-CoV-2 target nucleic acids are DETECTED. The SARS-CoV-2 RNA is generally detectable in upper and lower  respiratory specimens dur ing the acute phase of infection.  Positive  results are indicative of active infection with SARS-CoV-2.  Clinical  correlation with patient history and other diagnostic information is  necessary to determine patient infection status.  Positive results do  not rule out bacterial infection or co-infection with other viruses. If result is PRESUMPTIVE POSTIVE SARS-CoV-2 nucleic acids MAY BE PRESENT.   A presumptive positive result was obtained on the submitted specimen  and confirmed on repeat testing.  While 2019 novel coronavirus  (SARS-CoV-2) nucleic acids may be present in the submitted sample  additional confirmatory testing may be necessary for epidemiological  and / or clinical management purposes  to differentiate between  SARS-CoV-2 and other Sarbecovirus  currently known to infect humans.  If clinically indicated additional testing with an alternate test  methodology [phone]) is advised. The SARS-CoV-2 RNA is generally  detectable in upper and lower respiratory sp ecimens during the acute  phase of infection. The expected result is Negative. Fact Sheet for Patients:  StrictlyIdeas.no Fact Sheet for Healthcare Providers: BankingDealers.co.za This test is not yet approved or cleared by the [place] FDA and has been authorized for detection and/or diagnosis of SARS-CoV-2 by FDA under an Emergency Use Authorization (EUA).  This EUA will remain in effect (meaning this test can be used) for the duration of the COVID-19 declaration under Section 564(b)(1) of the Act, 21 U.S.C. section 360bbb-3(b)(1), unless the authorization is terminated or revoked sooner. Performed at [facility], [address] [place], [place] [address]   Respiratory Panel by PCR     Status: None   Collection Time: 10/24/18  9:17 AM  Result Value Ref Range Status   Adenovirus NOT DETECTED NOT DETECTED Final   Coronavirus 229E NOT DETECTED NOT DETECTED Final    Comment: (NOTE) The Coronavirus on the Respiratory Panel, DOES NOT test for the novel  Coronavirus (2019 nCoV)    Coronavirus HKU1 NOT DETECTED NOT DETECTED Final  Coronavirus NL63 NOT DETECTED NOT DETECTED Final   Coronavirus OC43 NOT DETECTED NOT DETECTED Final   Metapneumovirus NOT DETECTED NOT DETECTED Final   Rhinovirus / Enterovirus NOT DETECTED NOT DETECTED Final   Influenza A NOT DETECTED NOT DETECTED Final   Influenza B NOT DETECTED NOT DETECTED Final   Parainfluenza Virus 1 NOT DETECTED NOT DETECTED Final   Parainfluenza Virus 2 NOT DETECTED NOT DETECTED Final   Parainfluenza Virus 3 NOT DETECTED NOT DETECTED Final   Parainfluenza Virus 4 NOT DETECTED NOT DETECTED Final   Respiratory Syncytial Virus NOT DETECTED NOT DETECTED Final    Bordetella pertussis NOT DETECTED NOT DETECTED Final   Chlamydophila pneumoniae NOT DETECTED NOT DETECTED Final   Mycoplasma pneumoniae NOT DETECTED NOT DETECTED Final    Comment: Performed at [facility], [place] [address] [place], [place] [address]          IMAGING    Dg Chest Port 1 View  Result Date: 10/25/2018 CLINICAL DATA:  Respiratory failure. Follow-up pneumonia and/or ARDS. EXAM: PORTABLE CHEST 1 VIEW COMPARISON:  10/24/2018 and earlier. FINDINGS: Since the examination yesterday, improvement in aeration throughout both lungs, though interstitial and airspace opacities persist diffusely throughout both lungs. No new pulmonary parenchymal abnormalities. Cardiomediastinal silhouette unremarkable and unchanged. IMPRESSION: Improved aeration throughout both lungs since yesterday, though moderate pneumonia and/or ARDS persists diffusely throughout both lungs. No new abnormalities. Electronically Signed   By: Evangeline Dakin M.D.   On: 10/25/2018 08:07   4/20    4/21   4/22   I have Independently reviewed images of  CXR   on 10/25/2018 Interpretation:progressive bilateral infiltrates  ECHO 06/2018 EF 25% Grade 1 diastolic dysfunction  ASSESSMENT AND PLAN SYNOPSIS  Brittle DM with lower ext leg ischemia s/p BKA With progressive hypoxic resp failure with acute b/l Interstitial infiltrates with low grade fevers and elevated WBC count complicated by progressive renal failure  Possibility of TACO(transfusion associated circulatory overload)  Wean fio2 as tolerated continue high flow [place] adult tummy time and proning , meta nebs q4 hrs Continue IV abx as prescribed Follow up ID consultation Lasix infusion per nephrology recs    Critical Care Time devoted to patient care services described in this note is 34  minutes.   Overall, patient is critically ill, prognosis is guarded.   Corrin Parker, M.D.  [facility]  Medical Director  [place] Director [facility] Cardio-Pulmonary Department

## 2018-10-25 NOTE — Progress Notes (Signed)
Date of Admission:  10/18/2018        Subjective: Pt is sedated   on hiflo  Medications:  . calcium carbonate  500 mg Oral BID WC  . enoxaparin (LOVENOX) injection  30 mg Subcutaneous Q24H  . feeding supplement (GLUCERNA SHAKE)  237 mL Oral TID BM  . ferrous sulfate  325 mg Oral BID WC  . gabapentin  300 mg Oral BID  . insulin aspart  0-9 Units Subcutaneous Q4H  . insulin glargine  25 Units Subcutaneous BID  . ipratropium-albuterol  3 mL Nebulization Q4H  . mouth rinse  15 mL Mouth Rinse BID  . metoprolol tartrate  12.5 mg Oral BID  . multivitamin with minerals  1 tablet Oral Daily  . polyethylene glycol  17 g Oral Daily  . vitamin B-12  5,000 mcg Oral Daily  . ascorbic acid  250 mg Oral BID  . zolpidem  10 mg Oral QHS    Objective: Vital signs in last 24 hours: Temp:  [97.8 F (36.6 C)-99.3 F (37.4 C)] 98.8 F (37.1 C) (04/22 0200) Pulse Rate:  [86-128] 128 (04/22 0919) Resp:  [11-30] 15 (04/22 0807) BP: (91-152)/(59-104) 152/95 (04/22 0919) SpO2:  [79 %-100 %] 92 % (04/22 1100) FiO2 (%):  [75 %-96 %] 96 % (04/22 1100)  PHYSICAL EXAM:  General: sleeping Lungs:b/l crepts Heart:s1s2 tachycardia Abdomen: Soft,  Extremities: rt BKA site clean and no erythema Some pitting edema both legs Skin: No rashes or lesions. Or bruising Lymph: Cervical, supraclavicular normal. Neurologic: Grossly non-focal  Lab Results Recent Labs    10/24/18 0429 10/24/18 1101 10/25/18 0320  WBC 20.3*  --  22.8*  HGB 7.6* 7.3* 7.6*  HCT 25.1* 23.4* 24.4*  NA 134*  --  136  K 4.6  --  4.0  CL 108  --  108  CO2 17*  --  18*  BUN 86*  --  74*  CREATININE 3.44*  --  3.29*   Microbiology :MRSA nares neg Blood culture from 4/20 neg so far   Studies/Results: 10/25/18   10/24/18    Dg Chest Port 1 View  Result Date: 10/25/2018 CLINICAL DATA:  Respiratory failure. Follow-up pneumonia and/or ARDS. EXAM: PORTABLE CHEST 1 VIEW COMPARISON:  10/24/2018 and earlier. FINDINGS:  Since the examination yesterday, improvement in aeration throughout both lungs, though interstitial and airspace opacities persist diffusely throughout both lungs. No new pulmonary parenchymal abnormalities. Cardiomediastinal silhouette unremarkable and unchanged. IMPRESSION: Improved aeration throughout both lungs since yesterday, though moderate pneumonia and/or ARDS persists diffusely throughout both lungs. No new abnormalities. Electronically Signed   By: Evangeline Dakin M.D.   On: 10/25/2018 08:07   Dg Chest Port 1 View  Result Date: 10/24/2018 CLINICAL DATA:  33 year old male with acute respiratory failure EXAM: PORTABLE CHEST 1 VIEW COMPARISON:  Prior chest x-ray 10/23/2018 FINDINGS: Marked interval progression in diffuse bilateral interstitial and airspace opacities as well as a concomitant decrease in inspiratory volumes. Air bronchograms are visualized bilaterally. No pleural effusion or pneumothorax. The visualized bowel gas pattern is unremarkable. Osseous structures are intact and unremarkable. IMPRESSION: 1. Marked interval progression of diffuse bilateral interstitial and airspace opacities concerning for progression of multifocal pneumonia. Additional differential considerations include diffuse alveolar hemorrhage, ARDS, acute pneumonitis and vaping related lung injury. Electronically Signed   By: Jacqulynn Cadet M.D.   On: 10/24/2018 08:03     Assessment/Plan: 33 y.o. malewith a history of IDDM, CKD, B/L Diabetic foot infection , osteomyelitis s/p amputation of toes,was  admitted fro [facility] and had it on 4/15 Hypoxic  ? Acute hypoxia with b/l worsening infiltrate  New fever in the hospital with b/l infiltrate lungs being treated as HCAP but very likely this is pulmonary edema  as the repeat  CXR has shown significant improvement with diuretic- TACO is suspected as he received PRBC on 4/16 and 4/18 Will DC vanco and zithromax Continue zosyn - may Dc soon  BKA site healthy      AKI on CKD  DM-poorly controlled on insulin  Discussed the management with intensivist

## 2018-10-25 NOTE — Progress Notes (Signed)
[facility] Daily Progress Note   Subjective: 7 Days Post-Op: Right below-the-knee amputation  Patient alert this AM. On Bipap. Still with ongoing pain to the right stump however on less pain medication then when I last saw him. Sitting in bed.   Objective: Vitals:   10/25/18 0500 10/25/18 0600 10/25/18 0807 10/25/18 0919  BP: 111/70 118/75  (!) 152/95  Pulse: (!) 105 (!) 104 (!) 111 (!) 128  Resp: 12 12 15    Temp:      TempSrc:      SpO2: 98% 99% 100%   Weight:      Height:        Intake/Output Summary (Last 24 hours) at 10/25/2018 1016 Last data filed at 10/25/2018 0600 Gross per 24 hour  Intake 1174.73 ml  Output 1150 ml  Net 24.73 ml   Physical Exam: A&Ox3, NAD, On Bipap CV: RRR Pulmonary: Diminished breath sounds bilaterally Abdomen: Soft, Nontender, Nondistended, (+) Bowel Sounds Vascular:  Right Lower Extremity: Right stump open to air. Thigh soft. Incision: clean, dry and intact. Not able to straighten knee.    Laboratory: CBC    Component Value Date/Time   WBC 22.8 (H) 10/25/2018 0320   HGB 7.6 (L) 10/25/2018 0320   HGB 16.4 10/06/2014 0539   HCT 24.4 (L) 10/25/2018 0320   HCT 47.4 10/06/2014 0539   PLT 320 10/25/2018 0320   PLT 250 10/06/2014 0539   BMET    Component Value Date/Time   NA 136 10/25/2018 0320   NA 128 (L) 08/14/2018 1055   NA 132 (L) 10/06/2014 0539   K 4.0 10/25/2018 0320   K 3.4 (L) 10/06/2014 0539   CL 108 10/25/2018 0320   CL 91 (L) 10/06/2014 0539   CO2 18 (L) 10/25/2018 0320   CO2 26 10/06/2014 0539   GLUCOSE 80 10/25/2018 0320   GLUCOSE 634 (HH) 10/06/2014 0539   BUN 74 (H) 10/25/2018 0320   BUN 25 (H) 08/14/2018 1055   BUN 16 10/06/2014 0539   CREATININE 3.29 (H) 10/25/2018 0320   CREATININE 1.04 10/06/2014 0539   CALCIUM 8.0 (L) 10/25/2018 0320   CALCIUM 10.0 10/06/2014 0539   GFRNONAA 24 (L) 10/25/2018 0320   GFRNONAA >60 10/06/2014 0539   GFRAA 27 (L) 10/25/2018 0320   GFRAA >60 10/06/2014  0539   Assessment/Planning: The patient is a 33 year old male with multiple medical issues including peripheral artery disease status post a right below the knee amputation approximately 1 week ago transferred to the ICU for decline in his respiratory status/hypoxemia 1) Patient does not want to be intubated - at this time, making every effort to follow his wishes.  Satting into the mid 70s this morning while talking during my visit.  Encouraged taking deep breaths. 2) Unfortunate the patient is unable to work with physical therapy at this time due to his critical illness.  The patient is unable to completely straighten at the right knee joint.  The patient is at high risk for contracture to the knee joint and subsequently not being able to walk with a prosthetic.  Had a long discussion with the patient about making a concerted effort to straighten and bend the right knee multiple times throughout the day.  We discussed his risk for contracture, possible need for an above-the-knee amputation or inability to walk with a prosthetic if he does not keep the knee joint flexible.  He expresses understanding. 3) Hypoxemia: As per critical care team 4) Sepsis: As per  ID  Discussed with Dr. Eber Hong Ryota Treece PA-C 10/25/2018 10:16 AM

## 2018-10-25 NOTE — Progress Notes (Signed)
Inpatient Diabetes Program Recommendations  AACE/ADA: New Consensus Statement on Inpatient Glycemic Control (2015)  Target Ranges:  Prepandial:   less than 140 mg/dL      Peak postprandial:   less than 180 mg/dL (1-2 hours)      Critically ill patients:  140 - 180 mg/dL   Lab Results  Component Value Date   GLUCAP 97 10/25/2018   HGBA1C 13.7 (H) 09/20/2018    Review of Glycemic Control Results for Shawn [name], Shawn [name] (MRN [id number]) as of 10/25/2018 08:20  Ref. Range 10/24/2018 16:43 10/24/2018 17:32 10/24/2018 18:24 10/24/2018 19:39 10/24/2018 20:34 10/25/2018 00:13 10/25/2018 04:05 10/25/2018 07:28 10/25/2018 08:05  Glucose-Capillary Latest Ref Range: 70 - 99 mg/dL 82 64 (L) 83 51 (L) 126 (H) 63 (L) 83 62 (L) 97  Review of Glycemic Control  Diabetes history: DM1 (makes NO insulin;is sensitive to insulin;requires basal, correction, and meal coverage insulin) Outpatient Diabetes medications:Lantus40units BID,Novolog 4 units TID with meals Current orders for Inpatient glycemic control:Lantus25units BID, Novolog 0-9units Q4H  Inpatient Diabetes Program Recommendations:   Blood glucose continues to be low. Patient is NPO.  Consider reducing Lantus to 12 units bid.   Thanks,  Adah Perl, RN, BC-ADM Inpatient Diabetes Coordinator Pager [phone] (8a-5p)

## 2018-10-26 ENCOUNTER — Inpatient Hospital Stay: Payer: Medicaid Other

## 2018-10-26 LAB — GLUCOSE, CAPILLARY
Glucose-Capillary: 38 mg/dL — CL (ref 70–99)
Glucose-Capillary: 38 mg/dL — CL (ref 70–99)
Glucose-Capillary: 134 mg/dL — ABNORMAL HIGH (ref 70–99)
Glucose-Capillary: 72 mg/dL (ref 70–99)
Glucose-Capillary: 189 mg/dL — ABNORMAL HIGH (ref 70–99)
Glucose-Capillary: 233 mg/dL — ABNORMAL HIGH (ref 70–99)
Glucose-Capillary: 257 mg/dL — ABNORMAL HIGH (ref 70–99)
Glucose-Capillary: 278 mg/dL — ABNORMAL HIGH (ref 70–99)

## 2018-10-26 LAB — CBC
HCT: 22 % — ABNORMAL LOW (ref 39.0–52.0)
Hemoglobin: 6.9 g/dL — ABNORMAL LOW (ref 13.0–17.0)
MCH: 27.6 pg (ref 26.0–34.0)
MCHC: 31.4 g/dL (ref 30.0–36.0)
MCV: 88 fL (ref 80.0–100.0)
Platelets: 387 10*3/uL (ref 150–400)
RBC: 2.5 MIL/uL — ABNORMAL LOW (ref 4.22–5.81)
RDW: 14 % (ref 11.5–15.5)
WBC: 21.2 10*3/uL — ABNORMAL HIGH (ref 4.0–10.5)
nRBC: 0 % (ref 0.0–0.2)

## 2018-10-26 LAB — BASIC METABOLIC PANEL
Anion gap: 11 (ref 5–15)
BUN: 72 mg/dL — ABNORMAL HIGH (ref 6–20)
CO2: 19 mmol/L — ABNORMAL LOW (ref 22–32)
Calcium: 7.8 mg/dL — ABNORMAL LOW (ref 8.9–10.3)
Chloride: 104 mmol/L (ref 98–111)
Creatinine, Ser: 3.75 mg/dL — ABNORMAL HIGH (ref 0.61–1.24)
GFR calc Af Amer: 23 mL/min — ABNORMAL LOW (ref >60)
GFR calc non Af Amer: 20 mL/min — ABNORMAL LOW (ref >60)
Glucose, Bld: 131 mg/dL — ABNORMAL HIGH (ref 70–99)
Potassium: 3.5 mmol/L (ref 3.5–5.1)
Sodium: 134 mmol/L — ABNORMAL LOW (ref 135–145)

## 2018-10-26 LAB — HEPATIC FUNCTION PANEL
ALT: 25 U/L (ref 0–44)
AST: 35 U/L (ref 15–41)
Albumin: 1.5 g/dL — ABNORMAL LOW (ref 3.5–5.0)
Alkaline Phosphatase: 229 U/L — ABNORMAL HIGH (ref 38–126)
Bilirubin, Direct: 0.1 mg/dL (ref 0.0–0.2)
Indirect Bilirubin: 0.6 mg/dL (ref 0.3–0.9)
Total Bilirubin: 0.7 mg/dL (ref 0.3–1.2)
Total Protein: 6.1 g/dL — ABNORMAL LOW (ref 6.5–8.1)

## 2018-10-26 LAB — ANCA TITERS
Atypical P-ANCA titer: <1:20 {titer}
C-ANCA: <1:20 {titer}
P-ANCA: <1:20 {titer}

## 2018-10-26 LAB — BLOOD GAS, ARTERIAL
Acid-base deficit: 9.8 mmol/L — ABNORMAL HIGH (ref 0.0–2.0)
Bicarbonate: 19.2 mmol/L — ABNORMAL LOW (ref 20.0–28.0)
FIO2: 1
MECHVT: 450 mL
O2 Saturation: 99.9 %
PEEP: 10 cmH2O
Patient temperature: 37.8
RATE: 16 resp/min
pCO2 arterial: 54 mmHg — ABNORMAL HIGH (ref 32.0–48.0)
pH, Arterial: 7.15 — CL (ref 7.350–7.450)
pO2, Arterial: 392 mmHg — ABNORMAL HIGH (ref 83.0–108.0)

## 2018-10-26 LAB — GLOMERULAR BASEMENT MEMBRANE ANTIBODIES: GBM Ab: 3 units (ref 0–20)

## 2018-10-26 LAB — MAGNESIUM: Magnesium: 1.9 mg/dL (ref 1.7–2.4)

## 2018-10-26 LAB — MPO/PR-3 (ANCA) ANTIBODIES
ANCA Proteinase 3: <3.5 U/mL (ref 0.0–3.5)
Myeloperoxidase Abs: <9 U/mL (ref 0.0–9.0)

## 2018-10-26 LAB — PROCALCITONIN: Procalcitonin: 32.38 ng/mL

## 2018-10-26 LAB — FERRITIN: Ferritin: 626 ng/mL — ABNORMAL HIGH (ref 24–336)

## 2018-10-26 LAB — C3 COMPLEMENT: C3 Complement: 157 mg/dL (ref 82–167)

## 2018-10-26 LAB — C-REACTIVE PROTEIN: CRP: 42.9 mg/dL — ABNORMAL HIGH (ref <1.0)

## 2018-10-26 LAB — C4 COMPLEMENT: Complement C4, Body Fluid: 34 mg/dL (ref 14–44)

## 2018-10-26 LAB — SEDIMENTATION RATE: Sed Rate: 126 mm/hr — ABNORMAL HIGH (ref 0–15)

## 2018-10-26 MED ORDER — FAMOTIDINE 20 MG PO TABS
20.0000 mg | ORAL_TABLET | Freq: Every day | ORAL | Status: DC
  Administered 2018-10-26: 20 mg via ORAL

## 2018-10-26 MED ORDER — SODIUM CHLORIDE 0.9 % IV BOLUS
1000.0000 mL | Freq: Once | INTRAVENOUS | Status: AC
  Administered 2018-10-26: 1000 mL via INTRAVENOUS

## 2018-10-26 MED ORDER — MIDAZOLAM HCL 2 MG/2ML IJ SOLN
INTRAMUSCULAR | Status: AC
  Administered 2018-10-26: 11:00:00 4 mg via INTRAVENOUS
  Filled 2018-10-26: qty 4

## 2018-10-26 MED ORDER — FAMOTIDINE IN NACL 20-0.9 MG/50ML-% IV SOLN: 20.0000 mg | INTRAVENOUS | Status: DC

## 2018-10-26 MED ORDER — FENTANYL CITRATE (PF) 100 MCG/2ML IJ SOLN
200.0000 ug | Freq: Once | INTRAMUSCULAR | Status: AC
  Administered 2018-10-26: 11:00:00 200 ug via INTRAVENOUS

## 2018-10-26 MED ORDER — ADULT MULTIVITAMIN LIQUID CH
15.0000 mL | Freq: Every day | ORAL | Status: DC
  Administered 2018-10-27: 15 mL
  Filled 2018-10-26: qty 15

## 2018-10-26 MED ORDER — EPOETIN ALFA 10000 UNIT/ML IJ SOLN
20000.0000 [IU] | INTRAMUSCULAR | Status: DC
  Administered 2018-10-26: 20000 [IU] via SUBCUTANEOUS
  Filled 2018-10-26: qty 2

## 2018-10-26 MED ORDER — EPOETIN ALFA 40000 UNIT/ML IJ SOLN: 20000.0000 [IU] | Freq: Once | INTRAMUSCULAR | Status: DC

## 2018-10-26 MED ORDER — ACETAMINOPHEN 650 MG RE SUPP
650.0000 mg | Freq: Four times a day (QID) | RECTAL | Status: DC | PRN
  Administered 2018-10-26: 650 mg via RECTAL
  Filled 2018-10-26: qty 1

## 2018-10-26 MED ORDER — HEPARIN SODIUM (PORCINE) 5000 UNIT/ML IJ SOLN
5000.0000 [IU] | Freq: Three times a day (TID) | INTRAMUSCULAR | Status: DC
  Administered 2018-10-26 – 2018-10-27 (×2): 5000 [IU] via SUBCUTANEOUS
  Filled 2018-10-26 (×2): qty 1

## 2018-10-26 MED ORDER — VECURONIUM BROMIDE 10 MG IV SOLR
10.0000 mg | Freq: Once | INTRAVENOUS | Status: AC
  Administered 2018-10-26: 11:00:00 10 mg via INTRAVENOUS

## 2018-10-26 MED ORDER — FENTANYL 2500MCG IN NS 250ML (10MCG/ML) PREMIX INFUSION
0.0000 ug/h | INTRAVENOUS | Status: DC
  Administered 2018-10-26: 11:00:00 50 ug/h via INTRAVENOUS
  Administered 2018-10-26: 350 ug/h via INTRAVENOUS
  Administered 2018-10-27 (×2): 200 ug/h via INTRAVENOUS
  Administered 2018-10-27: 300 ug/h via INTRAVENOUS
  Administered 2018-10-28: 150 ug/h via INTRAVENOUS
  Administered 2018-10-28: 300 ug/h via INTRAVENOUS
  Filled 2018-10-26 (×7): qty 250

## 2018-10-26 MED ORDER — FAMOTIDINE 20 MG PO TABS
20.0000 mg | ORAL_TABLET | Freq: Every day | ORAL | Status: DC
  Administered 2018-10-27: 20 mg
  Filled 2018-10-26: qty 1

## 2018-10-26 MED ORDER — FENTANYL CITRATE (PF) 100 MCG/2ML IJ SOLN
INTRAMUSCULAR | Status: AC
  Administered 2018-10-26: 200 ug via INTRAVENOUS
  Filled 2018-10-26: qty 4

## 2018-10-26 MED ORDER — ACETAMINOPHEN 325 MG PO TABS
650.0000 mg | ORAL_TABLET | Freq: Four times a day (QID) | ORAL | Status: DC | PRN
  Administered 2018-10-27 – 2018-10-28 (×2): 650 mg
  Filled 2018-10-26 (×2): qty 2

## 2018-10-26 MED ORDER — ORAL CARE MOUTH RINSE
15.0000 mL | OROMUCOSAL | Status: DC
  Administered 2018-10-26 – 2018-10-30 (×39): 15 mL via OROMUCOSAL

## 2018-10-26 MED ORDER — FERROUS SULFATE 220 (44 FE) MG/5ML PO ELIX: 220.0000 mg | ORAL_SOLUTION | Freq: Two times a day (BID) | ORAL | Status: DC

## 2018-10-26 MED ORDER — STERILE WATER FOR INJECTION IJ SOLN
INTRAMUSCULAR | Status: AC
  Administered 2018-10-26: 12:00:00
  Filled 2018-10-26: qty 10

## 2018-10-26 MED ORDER — ALPRAZOLAM 0.25 MG PO TABS: 0.2500 mg | ORAL_TABLET | Freq: Three times a day (TID) | ORAL | Status: DC | PRN

## 2018-10-26 MED ORDER — FENTANYL 2500MCG IN NS 250ML (10MCG/ML) PREMIX INFUSION
INTRAVENOUS | Status: AC
  Administered 2018-10-26: 50 ug/h via INTRAVENOUS
  Filled 2018-10-26: qty 250

## 2018-10-26 MED ORDER — CHLORHEXIDINE GLUCONATE 0.12% ORAL RINSE (MEDLINE KIT)
15.0000 mL | Freq: Two times a day (BID) | OROMUCOSAL | Status: DC
  Administered 2018-10-26 – 2018-11-01 (×12): 15 mL via OROMUCOSAL

## 2018-10-26 MED ORDER — MIDAZOLAM HCL (PF) 2 MG/2ML IJ SOLN
4.0000 mg | Freq: Once | INTRAMUSCULAR | Status: AC
  Administered 2018-10-26: 11:00:00 4 mg via INTRAVENOUS
  Filled 2018-10-26: qty 4

## 2018-10-26 MED ORDER — VITAMIN C 500 MG PO TABS
250.0000 mg | ORAL_TABLET | Freq: Two times a day (BID) | ORAL | Status: DC
  Administered 2018-10-26 – 2018-10-31 (×9): 250 mg
  Filled 2018-10-26 (×8): qty 1

## 2018-10-26 MED ORDER — VECURONIUM BROMIDE 10 MG IV SOLR
INTRAVENOUS | Status: AC
  Administered 2018-10-26: 11:00:00 10 mg via INTRAVENOUS
  Filled 2018-10-26: qty 10

## 2018-10-26 NOTE — Progress Notes (Signed)
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 Treated with PCA for pain, control FSBS 4/20 progressive hypoxia with acute b/l infiltrates then transferred to [place] for further care and management    Review of Systems:  Gen:  Denies  fever, sweats, chills weigh loss  HEENT: Denies blurred vision, double vision, ear pain, eye pain, hearing loss, nose bleeds, sore throat Cardiac:  No dizziness, chest pain or heaviness, chest tightness,edema, No JVD Resp:   +SOB  Gi: Denies swallowing difficulty, stomach pain, nausea or vomiting, diarrhea, constipation, bowel incontinence Gu:  Denies bladder incontinence, burning urine Ext:   Denies Joint pain, stiffness or swelling Skin: Denies  skin rash, easy bruising or bleeding or hives Endoc:  Denies polyuria, polydipsia , polyphagia or weight change Psych:   Denies depression, insomnia or hallucinations  Other:  All other systems negative    VITAL SIGNS: Temp:  [99 F (37.2 C)] 99 F (37.2 C) (04/23 0200) Pulse Rate:  [102-132] 128 (04/23 0622) Resp:  [13-32] 29 (04/23 0622) BP: (114-154)/(76-95) 154/93 (04/23 0600) SpO2:  [87 %-100 %] 93 % (04/23 0622) FiO2 (%):  [80 %-96 %] 95 % (04/23 0432) Weight:  [64.8 kg] 64.8 kg (04/23 0500)   PHYSICAL EXAMINATION:  GENERAL:critically ill appearing, +resp distress HEAD: Normocephalic, atraumatic.  EYES: Pupils equal, round, reactive to light.  No scleral icterus.  MOUTH: Moist mucosal membrane. NECK: Supple. No  thyromegaly. No nodules. No JVD.  PULMONARY: +rhonchi, +wheezing CARDIOVASCULAR: S1 and S2. Regular rate and rhythm. No murmurs, rubs, or gallops.  GASTROINTESTINAL: Soft, nontender, -distended. No masses. Positive bowel sounds. No hepatosplenomegaly.  MUSCULOSKELETAL: No swelling, clubbing, or edema.  NEUROLOGIC: alert and awake SKIN:intact,warm,dry        CULTURE RESULTS   Recent Results (from the past 240 hour(s))  Culture, blood (x 2)     Status: None (Preliminary result)   Collection Time: 10/23/18  6:49 AM  Result Value Ref Range Status   Specimen Description BLOOD RIGHT ARM  Final   Special Requests   Final    BOTTLES DRAWN AEROBIC AND ANAEROBIC Blood Culture results may not be optimal due to an excessive volume of blood received in culture bottles   Culture   Final    NO GROWTH 3 DAYS Performed at [facility], [address], [place], [place] [address]    Report Status PENDING  Incomplete  Culture, blood (x 2)     Status: None (Preliminary result)   Collection Time: 10/23/18  6:49 AM  Result Value Ref Range Status   Specimen Description BLOOD LEFT WRIST  Final   Special Requests BOTTLES DRAWN AEROBIC AND ANAEROBIC [place]  Final   Culture   Final    NO GROWTH 3 DAYS Performed at [facility], [address], [place], [place] [address]    Report Status PENDING  Incomplete  MRSA PCR Screening     Status: None   Collection Time: 10/23/18  7:40 AM  Result Value Ref Range Status   MRSA  by PCR NEGATIVE NEGATIVE Final    Comment:        The GeneXpert MRSA Assay (FDA approved for NASAL specimens only), is one component of a comprehensive MRSA colonization surveillance program. It is not intended to diagnose MRSA infection nor to guide or monitor treatment for MRSA infections. Performed at [facility], [address] [place], [place] [address]   SARS Coronavirus 2 [facility] order, Performed in [facility] hospital lab)     Status:  None   Collection Time: 10/23/18  6:06 PM  Result Value Ref Range Status   SARS Coronavirus 2 NEGATIVE NEGATIVE Final    Comment: (NOTE) If result is NEGATIVE SARS-CoV-2 target nucleic acids are NOT DETECTED. The SARS-CoV-2 RNA is generally detectable in upper and lower  respiratory specimens during the acute phase of infection. The lowest  concentration of SARS-CoV-2 viral copies this assay can detect is 250  copies / mL. A negative result does not preclude SARS-CoV-2 infection  and should not be used as the sole basis for treatment or other  patient management decisions.  A negative result may occur with  improper specimen collection / handling, submission of specimen other  than nasopharyngeal swab, presence of viral mutation(s) within the  areas targeted by this assay, and inadequate number of viral copies  (<250 copies / mL). A negative result must be combined with clinical  observations, patient history, and epidemiological information. If result is POSITIVE SARS-CoV-2 target nucleic acids are DETECTED. The SARS-CoV-2 RNA is generally detectable in upper and lower  respiratory specimens dur ing the acute phase of infection.  Positive  results are indicative of active infection with SARS-CoV-2.  Clinical  correlation with patient history and other diagnostic information is  necessary to determine patient infection status.  Positive results do  not rule out bacterial infection or co-infection with other viruses. If result is PRESUMPTIVE POSTIVE SARS-CoV-2 nucleic acids MAY BE PRESENT.   A presumptive positive result was obtained on the submitted specimen  and confirmed on repeat testing.  While 2019 novel coronavirus  (SARS-CoV-2) nucleic acids may be present in the submitted sample  additional confirmatory testing may be necessary for epidemiological  and / or clinical management purposes  to differentiate between  SARS-CoV-2 and other Sarbecovirus currently known to infect  humans.  If clinically indicated additional testing with an alternate test  methodology [phone]) is advised. The SARS-CoV-2 RNA is generally  detectable in upper and lower respiratory sp ecimens during the acute  phase of infection. The expected result is Negative. Fact Sheet for Patients:  StrictlyIdeas.no Fact Sheet for Healthcare Providers: BankingDealers.co.za This test is not yet approved or cleared by the [place] FDA and has been authorized for detection and/or diagnosis of SARS-CoV-2 by FDA under an Emergency Use Authorization (EUA).  This EUA will remain in effect (meaning this test can be used) for the duration of the COVID-19 declaration under Section 564(b)(1) of the Act, 21 U.S.C. section 360bbb-3(b)(1), unless the authorization is terminated or revoked sooner. Performed at [facility], [address] [place], [place] [address]   Respiratory Panel by PCR     Status: None   Collection Time: 10/24/18  9:17 AM  Result Value Ref Range Status   Adenovirus NOT DETECTED NOT DETECTED Final   Coronavirus 229E NOT DETECTED NOT DETECTED Final    Comment: (NOTE) The Coronavirus on the Respiratory Panel, DOES NOT test for the novel  Coronavirus (2019 nCoV)    Coronavirus HKU1 NOT  DETECTED NOT DETECTED Final   Coronavirus NL63 NOT DETECTED NOT DETECTED Final   Coronavirus OC43 NOT DETECTED NOT DETECTED Final   Metapneumovirus NOT DETECTED NOT DETECTED Final   Rhinovirus / Enterovirus NOT DETECTED NOT DETECTED Final   Influenza A NOT DETECTED NOT DETECTED Final   Influenza B NOT DETECTED NOT DETECTED Final   Parainfluenza Virus 1 NOT DETECTED NOT DETECTED Final   Parainfluenza Virus 2 NOT DETECTED NOT DETECTED Final   Parainfluenza Virus 3 NOT DETECTED NOT DETECTED Final   Parainfluenza Virus 4 NOT DETECTED NOT DETECTED Final   Respiratory Syncytial Virus NOT DETECTED NOT DETECTED Final   Bordetella pertussis NOT  DETECTED NOT DETECTED Final   Chlamydophila pneumoniae NOT DETECTED NOT DETECTED Final   Mycoplasma pneumoniae NOT DETECTED NOT DETECTED Final    Comment: Performed at [facility], [place] [address] [place], [place] [address]          IMAGING    Dg Chest Port 1 View  Result Date: 10/26/2018 CLINICAL DATA:  33 year old male with respiratory failure. Negative for COVID-19 on 10/23/2018. History of diabetes with complications, recent right BKA. EXAM: PORTABLE CHEST 1 VIEW COMPARISON:  10/25/2018 and earlier. FINDINGS: Portable AP semi upright view at 0337 hours. Course nodular and confluent bilateral pulmonary opacity throughout both lungs. Ventilation has improved since 10/24/2018, and right lower lung ventilation appears slightly improved since yesterday. No superimposed pneumothorax or pleural effusion. Mediastinal contours remain normal. Visualized tracheal air column is within normal limits. Negative visible bowel gas pattern. IMPRESSION: 1. Continued confluent bilateral pulmonary opacity with improved ventilation since 10/24/2018, slightly improved in the right lower lung since yesterday. 2. No new cardiopulmonary abnormality. Electronically Signed   By: Genevie Ann M.D.   On: 10/26/2018 05:58   4/20    4/21   4/22       I have Independently reviewed images of  CXR   on 10/26/2018 Interpretation:progressive bilateral infiltrates  ECHO 06/2018 EF 76% Grade 1 diastolic dysfunction   ECHO 4/22  EF 60%  ASSESSMENT AND PLAN SYNOPSIS   BRITTLE DM WITH LOWER EXT GANGRENE S/P BKA With progressive hypoxic resp failure complicated by acute on chronic renal failure Possibility of TACO(transfusion associated circulatory overload)  Severe ACUTE Hypoxic and Hypercapnic Respiratory Failure Wean fio2 as tolerated Adult tummy time proning metaNEBS every 4 Follow up ID recs Lasix infusion  ELECTROLYTES -follow labs as needed -replace as needed -pharmacy consultation and  following   Critical Care Time devoted to patient care services described in this note is 36  minutes.   Overall, patient is critically ill, prognosis is guarded.  Patient with Multiorgan failure and at high risk for cardiac arrest and death.    Corrin Parker, M.D.  [facility]  Medical Director [place] Director [facility] Cardio-Pulmonary Department

## 2018-10-26 NOTE — Progress Notes (Signed)
Patient on HFNC. Able to wean some during the shift. However, suffered some decompensation due to changing of linen requiring me to increase oxygen back to basically start.  Patient now on 95% 60L. o2 saturation noted at 100%. Will leave here until as patient isnt finished with dressing.  RN stopped due to breathing and saturation.  Treatments given via metaneb.  Patient tolerated fairly ok. C/o some chest pain in percussion mode so I alternated modes the modes during tx so that patient could tolerate.

## 2018-10-26 NOTE — Progress Notes (Signed)
ETT pulled back from 25cm to 23cm per Dr. Mortimer Fries after reviewing chest X-ray.

## 2018-10-26 NOTE — Progress Notes (Signed)
[facility] Daily Progress Note   Subjective: 8 Days Post-Op: Right below-the-knee amputation  Sleeping comfortably this AM.   Objective: Vitals:   10/26/18 0622 10/26/18 0812 10/26/18 0819 10/26/18 0900  BP:  128/89  (!) 141/95  Pulse: (!) 128 (!) 123 (!) 121 (!) 115  Resp: (!) 29  (!) 23 14  Temp:   98.9 F (37.2 C)   TempSrc:   Oral   SpO2: 93%  (!) 89% (!) 89%  Weight:      Height:        Intake/Output Summary (Last 24 hours) at 10/26/2018 8563 Last data filed at 10/26/2018 1497 Gross per 24 hour  Intake 619.36 ml  Output 2551 ml  Net -1931.64 ml   Physical Exam: A&Ox3, NAD CV: tachycardic Pulmonary: Diminished bilaterally Abdomen: Soft, Nontender, Nondistended, (+) Bowel Sounds Vascular:  Right Lower Extremity: Thigh soft. Knee somewhat flexible - unable to straighten. Stump healthy. Incision - clean, dry and intact   Laboratory: CBC    Component Value Date/Time   WBC 21.2 (H) 10/26/2018 0534   HGB 6.9 (L) 10/26/2018 0534   HGB 16.4 10/06/2014 0539   HCT 22.0 (L) 10/26/2018 0534   HCT 47.4 10/06/2014 0539   PLT 387 10/26/2018 0534   PLT 250 10/06/2014 0539   BMET    Component Value Date/Time   NA 134 (L) 10/26/2018 0534   NA 128 (L) 08/14/2018 1055   NA 132 (L) 10/06/2014 0539   K 3.5 10/26/2018 0534   K 3.4 (L) 10/06/2014 0539   CL 104 10/26/2018 0534   CL 91 (L) 10/06/2014 0539   CO2 19 (L) 10/26/2018 0534   CO2 26 10/06/2014 0539   GLUCOSE 131 (H) 10/26/2018 0534   GLUCOSE 634 (HH) 10/06/2014 0539   BUN 72 (H) 10/26/2018 0534   BUN 25 (H) 08/14/2018 1055   BUN 16 10/06/2014 0539   CREATININE 3.75 (H) 10/26/2018 0534   CREATININE 1.04 10/06/2014 0539   CALCIUM 7.8 (L) 10/26/2018 0534   CALCIUM 10.0 10/06/2014 0539   GFRNONAA 20 (L) 10/26/2018 0534   GFRNONAA >60 10/06/2014 0539   GFRAA 23 (L) 10/26/2018 0534   GFRAA >60 10/06/2014 0539   Assessment/Planning: The patient is a 33 year old male with multiple medical  issues including peripheral artery disease status post a right below the knee amputation approximately 1 week ago transferred to the ICU for decline in his respiratory status/hypoxemia 1) Patient does not want to be intubated - at this time, making every effort to follow his wishes.  Satting into the 90s this morning while sleeping. 2) Unfortunate the patient is unable to work with physical therapy at this time due to his critical illness.  The patient is unable to completely straighten at the right knee joint.  The patient is at high risk for contracture to the knee joint and subsequently not being able to walk with a prosthetic.  Had a long discussion with the patient about making a concerted effort to straighten and bend the right knee multiple times throughout the day.  We discussed his risk for contracture, possible need for an above-the-knee amputation or inability to walk with a prosthetic if he does not keep the knee joint flexible.  He expresses understanding. 3) Hypoxemia: As per critical care team 4) Sepsis: As per ID  Marcelle Overlie PA-C 10/26/2018 9:21 AM

## 2018-10-26 NOTE — Progress Notes (Signed)
   Date of Admission:  10/18/2018   Shawn [name] is a 33 y.o. malewith a history of IDDM, CKD, B/L Diabetic foot infection , osteomyelitis s/p amputation of toes, h/o MRSA bacteremia well known to me from previous hospitalizations was admitted on 10/18/18 for rt BKA for gangrene rt foot. He underwent surgery on that day by Dr.Schnier and was having high blood glucose. He was seen by hospitalist service for management of DM. He was continuing to have pain at the surgical site and also phantom pain. On 4/20  he had a temp of 102.8 and started to feel SOB- CXR showed multilobar pneumonia and he was started on vanco and zosyn and transferred to ICU 4/21 IV diuretic 4/22 CXR betetr  Subjective: Intubated today Was hypoxic and apneoc and code was called   Medications:  . epoetin (EPOGEN/PROCRIT) injection  20,000 Units Subcutaneous Weekly  . [START ON 10/27/2018] famotidine  20 mg Per Tube Daily  . heparin injection (subcutaneous)  5,000 Units Subcutaneous Q8H  . insulin aspart  0-9 Units Subcutaneous Q4H  . insulin glargine  12 Units Subcutaneous BID  . ipratropium-albuterol  3 mL Nebulization Q4H  . mouth rinse  15 mL Mouth Rinse BID  . multivitamin  15 mL Per Tube Daily  . polyethylene glycol  17 g Oral Daily  . vitamin B-12  5,000 mcg Oral Daily  . ascorbic acid  250 mg Per Tube BID    Objective: Vital signs in last 24 hours: Temp:  [98.9 F (37.2 C)-100.2 F (37.9 C)] 100.2 F (37.9 C) (04/23 1600) Pulse Rate:  [100-132] 100 (04/23 1800) Resp:  [0-29] 18 (04/23 1800) BP: (104-162)/(61-100) 110/61 (04/23 1800) SpO2:  [89 %-100 %] 100 % (04/23 1800) FiO2 (%):  [60 %-100 %] 60 % (04/23 1600) Weight:  [64.8 kg] 64.8 kg (04/23 0500)  PHYSICAL EXAM:  General: intubated and on fentanyl Lungs: b/l air entry Heart: tachycardia Abdomen: Soft,  Extremities: rt BKA Skin: No rashes or lesions. Or bruising Neurologic:cannot be examined Lab Results Recent Labs    10/25/18 0320  10/26/18 0534  WBC 22.8* 21.2*  HGB 7.6* 6.9*  HCT 24.4* 22.0*  NA 136 134*  K 4.0 3.5  CL 108 104  CO2 18* 19*  BUN 74* 72*  CREATININE 3.29* 3.75*   Liver Panel Recent Labs    10/26/18 1154  PROT 6.1*  ALBUMIN 1.5*  AST 35  ALT 25  ALKPHOS 229*  BILITOT 0.7  BILIDIR 0.1  IBILI 0.6   Sedimentation Rate Recent Labs    10/26/18 1154  ESRSEDRATE 126*   C-Reactive Protein Recent Labs    10/26/18 1154  CRP 42.9*    Microbiology: 4/20 BC NG   Assessment/Plan: 33 y.o.malewith ahistory ofIDDM, CKD, B/L Diabetic foot infection , osteomyelitis s/p amputation of toes,was admitted fro Rt BKA and had it on 4/15 Hypoxic  ? Acute hypoxia with b/l worsening infiltrate/leucocytosis/one episode of fever Still unclear what his condition is due to  Fluid overload/TACO ? Infection? DAH? ARDS??  As he is intubated recommend bronch , to send BAL for culture and also to look for any hemorrhage currently on zosyn but does not seem to help.   BKA site healthy     AKI on CKD  DM-poorly controlled on insulin  Discussed with his nurse

## 2018-10-26 NOTE — Evaluation (Addendum)
Patient underwent point-of-care ultrasound (POCUS).  Lung windows revealed no pleural thickening on the right, confluent B-lines and no significant consolidation.  Right lung revealed no pleural thickening, lines present and scattered B-lines.  No significant consolidation.    Cardiac subcostal windows showed hyperdynamic left ventricle with almost obliteration of the left ventricular cavity indicating intravascular volume depletion.  IVC not engorged.  Consistent with noncardiogenic pulmonary edema as seen in ARDS, also consider  intravascular volume depletion, low oncotic pressure.  Discussed with Dr. Mortimer Fries.  Findings are not consistent with viral pneumonia such as seen in COVID-19.

## 2018-10-26 NOTE — Progress Notes (Signed)
Ch responded to code regarding pt. Pt was not responsive at the time. RRT was attending to the pt. Pt still has abnormal CO2 levels. Ch was there to support staff. F/u recommended.    10/26/18 1100  Clinical Encounter Type  Visited With Patient;Health care provider  Visit Type Code  Stress Factors  Patient Stress Factors Major life changes;Health changes

## 2018-10-26 NOTE — TOC Progression Note (Signed)
Transition of Care [facility]) - Progression Note    Patient Details  Name: Shawn [name] MRN: [id number] Date of Birth: 1986-06-04  Transition of [facility] CM/SW Contact  Katrina Stack, RN Phone Number: 10/26/2018, 6:18 PM  Clinical Narrative:    Code Blue called this morning and patient now intubated and on ventilator.  Pulmonology has documented findings  are not consistent with the pneumonia usually seen in Covid 19. Lasix continuous infusion.  Fentanyl for sedation    Expected Discharge Plan: [place] Barriers to Discharge: No Barriers Identified  Expected Discharge Plan and Services Expected Discharge Plan: [place] arrangements for the past 2 months: Single Family Home Expected Discharge Date: 10/20/18                                     Social Determinants of Health (SDOH) Interventions    Readmission Risk Interventions Readmission Risk Prevention Plan 10/13/2018 09/21/2018 09/21/2018  Transportation Screening Complete Complete Complete  Medication Review Press photographer) Complete Complete Complete  PCP or Specialist appointment within 3-5 days of discharge Complete Complete Complete  HRI or Home Care Consult Complete Complete -  SW Recovery Care/Counseling Consult (No Data) Complete -  Palliative Care Screening Not Applicable Not Applicable Not Loving Not Applicable Not Applicable -  Some recent data might be hidden

## 2018-10-26 NOTE — Progress Notes (Signed)
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 Delana Meyer.  Postop course complicated by anemia requiring blood transfusion.  Hospital course is further complicated by acute  hypoxia with worsening bilateral infiltrate.  This morning, patient is again noted to be SOB. He is requiring oxygen supplementation by high flow nasal cannula. He is tachypneic and tachycardic No leg edema CXR still with infiltrates Cr slightly worse today at 3.75. UOP 1590 cc (foley) Clear urine in the foley bag Hemoglobin noted to be lower today at 6.9 Very sleepy this morning  Objective:  Vital signs in last 24 hours:  Temp:  [98.9 F (37.2 C)-99 F (37.2 C)] 98.9 F (37.2 C) (04/23 0819) Pulse Rate:  [102-132] 115 (04/23 0900) Resp:  [13-32] 14 (04/23 0900) BP: (114-154)/(76-95) 141/95 (04/23 0900) SpO2:  [87 %-100 %] 89 % (04/23 0900) FiO2 (%):  [84 %-96 %] 95 % (04/23 0848) Weight:  [64.8 kg] 64.8 kg (04/23 0500)  Weight change:  Filed Weights   10/19/18 0500 10/23/18 0541 10/26/18 0500  Weight: 52.9 kg 65.3 kg 64.8 kg    Intake/Output:    Intake/Output Summary (Last 24 hours) at 10/26/2018 0928 Last data filed at 10/26/2018 0907 Gross per 24 hour  Intake 619.36 ml  Output 2551 ml  Net -1931.64 ml     Physical Exam: General:  Chronically ill-appearing, thin  HEENT  anicteric, dry oral mucous membranes  Neck  supple  Pulm/lungs  bilateral diffuse crackles, high flow oxygen by nasal cannula  CVS/Heart  tachycardic, regular  Abdomen:   Soft, nontender  Extremities:  Right BKA, no edema on left  Neurologic:  Lethargic  Skin:  No acute rashes    Foley in place       Basic Metabolic Panel:  Recent Labs  Lab 10/21/18 0440 10/22/18 0531 10/23/18 0528 10/24/18 0429 10/25/18 0320 10/26/18 0534  NA 133* 131* 138 134* 136 134*  K 4.0  4.6 5.1 4.6 4.0 3.5  CL 103 101 108 108 108 104  CO2 23 21* 22 17* 18* 19*  GLUCOSE 109* 109* 224* 81 80 131*  BUN 55* 66* 73* 86* 74* 72*  CREATININE 2.78* 2.83* 3.26* 3.44* 3.29* 3.75*  CALCIUM 7.8* 8.1* 8.3* 7.7* 8.0* 7.8*  MG 1.9 2.2 2.1 2.0  --  1.9     CBC: Recent Labs  Lab 10/22/18 0531 10/23/18 0528 10/24/18 0429 10/24/18 1101 10/25/18 0320 10/26/18 0534  WBC 15.3* 10.9* 20.3*  --  22.8* 21.2*  HGB 9.4* 9.8* 7.6* 7.3* 7.6* 6.9*  HCT 28.9* 31.6* 25.1* 23.4* 24.4* 22.0*  MCV 86.3 88.5 91.6  --  89.4 88.0  PLT 252 311 264  --  320 387     No results found for: HEPBSAG, HEPBSAB, HEPBIGM    Microbiology:  Recent Results (from the past 240 hour(s))  Culture, blood (x 2)     Status: None (Preliminary result)   Collection Time: 10/23/18  6:49 AM  Result Value Ref Range Status   Specimen Description BLOOD RIGHT ARM  Final   Special Requests   Final    BOTTLES DRAWN AEROBIC AND ANAEROBIC Blood Culture results may not be optimal due to an excessive volume of blood received in culture bottles   Culture   Final    NO GROWTH 3 DAYS Performed at [facility], [address] [place], [place]  [address]    Report Status PENDING  Incomplete  Culture, blood (x 2)     Status: None (Preliminary result)   Collection Time: 10/23/18  6:49 AM  Result Value Ref Range Status   Specimen Description BLOOD LEFT WRIST  Final   Special Requests BOTTLES DRAWN AEROBIC AND ANAEROBIC [place]  Final   Culture   Final    NO GROWTH 3 DAYS Performed at [facility], [address] [place], [place] [address]    Report Status PENDING  Incomplete  MRSA PCR Screening     Status: None   Collection Time: 10/23/18  7:40 AM  Result Value Ref Range Status   MRSA by PCR NEGATIVE NEGATIVE Final    Comment:        The GeneXpert MRSA Assay (FDA approved for NASAL specimens only), is one component of a comprehensive MRSA colonization surveillance program. It is not intended to  diagnose MRSA infection nor to guide or monitor treatment for MRSA infections. Performed at [facility], [address] [place], [place] [address]   SARS Coronavirus 2 [facility] order, Performed in [facility] hospital lab)     Status: None   Collection Time: 10/23/18  6:06 PM  Result Value Ref Range Status   SARS Coronavirus 2 NEGATIVE NEGATIVE Final    Comment: (NOTE) If result is NEGATIVE SARS-CoV-2 target nucleic acids are NOT DETECTED. The SARS-CoV-2 RNA is generally detectable in upper and lower  respiratory specimens during the acute phase of infection. The lowest  concentration of SARS-CoV-2 viral copies this assay can detect is 250  copies / mL. A negative result does not preclude SARS-CoV-2 infection  and should not be used as the sole basis for treatment or other  patient management decisions.  A negative result may occur with  improper specimen collection / handling, submission of specimen other  than nasopharyngeal swab, presence of viral mutation(s) within the  areas targeted by this assay, and inadequate number of viral copies  (<250 copies / mL). A negative result must be combined with clinical  observations, patient history, and epidemiological information. If result is POSITIVE SARS-CoV-2 target nucleic acids are DETECTED. The SARS-CoV-2 RNA is generally detectable in upper and lower  respiratory specimens dur ing the acute phase of infection.  Positive  results are indicative of active infection with SARS-CoV-2.  Clinical  correlation with patient history and other diagnostic information is  necessary to determine patient infection status.  Positive results do  not rule out bacterial infection or co-infection with other viruses. If result is PRESUMPTIVE POSTIVE SARS-CoV-2 nucleic acids MAY BE PRESENT.   A presumptive positive result was obtained on the submitted specimen  and confirmed on repeat testing.  While 2019 novel coronavirus  (SARS-CoV-2)  nucleic acids may be present in the submitted sample  additional confirmatory testing may be necessary for epidemiological  and / or clinical management purposes  to differentiate between  SARS-CoV-2 and other Sarbecovirus currently known to infect humans.  If clinically indicated additional testing with an alternate test  methodology [phone]) is advised. The SARS-CoV-2 RNA is generally  detectable in upper and lower respiratory sp ecimens during the acute  phase of infection. The expected result is Negative. Fact Sheet for Patients:  StrictlyIdeas.no Fact Sheet for Healthcare Providers: BankingDealers.co.za This test is not yet approved or cleared by the [place] FDA and has been authorized for detection and/or diagnosis of SARS-CoV-2 by FDA under an Emergency Use Authorization (EUA).  This EUA  will remain in effect (meaning this test can be used) for the duration of the COVID-19 declaration under Section 564(b)(1) of the Act, 21 U.S.C. section 360bbb-3(b)(1), unless the authorization is terminated or revoked sooner. Performed at [facility], [address] [place], [place] [address]   Respiratory Panel by PCR     Status: None   Collection Time: 10/24/18  9:17 AM  Result Value Ref Range Status   Adenovirus NOT DETECTED NOT DETECTED Final   Coronavirus 229E NOT DETECTED NOT DETECTED Final    Comment: (NOTE) The Coronavirus on the Respiratory Panel, DOES NOT test for the novel  Coronavirus (2019 nCoV)    Coronavirus HKU1 NOT DETECTED NOT DETECTED Final   Coronavirus NL63 NOT DETECTED NOT DETECTED Final   Coronavirus OC43 NOT DETECTED NOT DETECTED Final   Metapneumovirus NOT DETECTED NOT DETECTED Final   Rhinovirus / Enterovirus NOT DETECTED NOT DETECTED Final   Influenza A NOT DETECTED NOT DETECTED Final   Influenza B NOT DETECTED NOT DETECTED Final   Parainfluenza Virus 1 NOT DETECTED NOT DETECTED Final    Parainfluenza Virus 2 NOT DETECTED NOT DETECTED Final   Parainfluenza Virus 3 NOT DETECTED NOT DETECTED Final   Parainfluenza Virus 4 NOT DETECTED NOT DETECTED Final   Respiratory Syncytial Virus NOT DETECTED NOT DETECTED Final   Bordetella pertussis NOT DETECTED NOT DETECTED Final   Chlamydophila pneumoniae NOT DETECTED NOT DETECTED Final   Mycoplasma pneumoniae NOT DETECTED NOT DETECTED Final    Comment: Performed at [facility], [place]. [address] [place], [place] [address]    Coagulation Studies: No results for input(s): LABPROT, INR in the last 72 hours.  Urinalysis: Recent Labs    10/24/18 0518  COLORURINE YELLOW*  LABSPEC 1.009  PHURINE 5.0  GLUCOSEU NEGATIVE  HGBUR SMALL*  BILIRUBINUR NEGATIVE  KETONESUR NEGATIVE  PROTEINUR 100*  NITRITE NEGATIVE  LEUKOCYTESUR NEGATIVE      Imaging: Dg Chest Port 1 View  Result Date: 10/26/2018 CLINICAL DATA:  33 year old male with respiratory failure. Negative for COVID-19 on 10/23/2018. History of diabetes with complications, recent right BKA. EXAM: PORTABLE CHEST 1 VIEW COMPARISON:  10/25/2018 and earlier. FINDINGS: Portable AP semi upright view at 0337 hours. Course nodular and confluent bilateral pulmonary opacity throughout both lungs. Ventilation has improved since 10/24/2018, and right lower lung ventilation appears slightly improved since yesterday. No superimposed pneumothorax or pleural effusion. Mediastinal contours remain normal. Visualized tracheal air column is within normal limits. Negative visible bowel gas pattern. IMPRESSION: 1. Continued confluent bilateral pulmonary opacity with improved ventilation since 10/24/2018, slightly improved in the right lower lung since yesterday. 2. No new cardiopulmonary abnormality. Electronically Signed   By: Genevie Ann M.D.   On: 10/26/2018 05:58   Dg Chest Port 1 View  Result Date: 10/25/2018 CLINICAL DATA:  Respiratory failure. Follow-up pneumonia and/or ARDS. EXAM: PORTABLE CHEST 1  VIEW COMPARISON:  10/24/2018 and earlier. FINDINGS: Since the examination yesterday, improvement in aeration throughout both lungs, though interstitial and airspace opacities persist diffusely throughout both lungs. No new pulmonary parenchymal abnormalities. Cardiomediastinal silhouette unremarkable and unchanged. IMPRESSION: Improved aeration throughout both lungs since yesterday, though moderate pneumonia and/or ARDS persists diffusely throughout both lungs. No new abnormalities. Electronically Signed   By: Evangeline Dakin M.D.   On: 10/25/2018 08:07     Medications:   . sodium chloride Stopped (10/25/18 0514)  . furosemide (LASIX) infusion 4 mg/hr (10/26/18 0907)  . piperacillin-tazobactam (ZOSYN)  IV 3.375 g (10/26/18 0553)   .  calcium carbonate  500 mg Oral BID WC  . enoxaparin (LOVENOX) injection  30 mg Subcutaneous Q24H  . famotidine  20 mg Oral Daily  . feeding supplement (GLUCERNA SHAKE)  237 mL Oral TID BM  . ferrous sulfate  325 mg Oral BID WC  . gabapentin  300 mg Oral BID  . insulin aspart  0-9 Units Subcutaneous Q4H  . insulin glargine  12 Units Subcutaneous BID  . ipratropium-albuterol  3 mL Nebulization Q4H  . mouth rinse  15 mL Mouth Rinse BID  . metoprolol tartrate  12.5 mg Oral BID  . multivitamin with minerals  1 tablet Oral Daily  . polyethylene glycol  17 g Oral Daily  . vitamin B-12  5,000 mcg Oral Daily  . ascorbic acid  250 mg Oral BID  . zolpidem  10 mg Oral QHS   sodium chloride, acetaminophen **OR** acetaminophen, ALPRAZolam, alum & mag hydroxide-simeth, dextrose, diphenoxylate-atropine, HYDROmorphone (DILAUDID) injection, ipratropium-albuterol, magnesium hydroxide, oxyCODONE-acetaminophen **AND** oxyCODONE, promethazine  Assessment/ Plan:  33 y.o. male with diabetes mellitus type I, diabetic neuropathy, depression, anxiety, status post bilateral toe amputations, hypertension, underwent right BKA on April 15, with complicated hospital course of anemia  requiring blood transfusion, hypoxia, requiring ICU monitoring and acute kidney injury. 2D echo from December 2019 shows moderate concentric hypertrophy, EF 50%, diffuse hypokinesis, grade 1 diastolic dysfunction  1.  Acute kidney injury on chronic kidney disease stage III Baseline creatinine of 1.8/GFR 49 from February 2020 Acute kidney injury appears to be multifactorial Diuresing well with IV Lasix infusion Avoid hypotension, nephrotoxins, IV contrast Electrolytes and volume status are acceptable.  No acute indication for dialysis ANA, ANCA results are pending ; complements normal -Continue IV Lasix infusion to correct volume overload  2.  Acute hypoxic respiratory failure Unclear cause but possibly volume overload versus pneumonia.  Patient is getting IV antibiotics empirically Consider acute lung injury secondary to transfusion  3.  Diabetes type 1 with CKD Diabetes is poorly controlled Hemoglobin A1c 13.7% on September 20, 2018  4.  Anemia Of blood loss and possibly chronic kidney disease -We will start subcu EPO weekly    LOS: [place] 4/23/20209:28 AM  [place], [place]  Note: This note was prepared with Dragon dictation. Any transcription errors are unintentional

## 2018-10-26 NOTE — Progress Notes (Signed)
Patient ID: Shawn [name], male   DOB: 1985/12/11, 33 y.o.   MRN: [id number]  [employer] PROGRESS NOTE  LACOREY BRUSCA XTK:[id number] DOB: 1986-04-04 DOA: 10/18/2018 PCP: Valera Castle, MD  HPI/Subjective: Patient complains of pain   Objective: Vitals:   10/26/18 0900 10/26/18 1000  BP: (!) 141/95   Pulse: (!) 115   Resp: 14   Temp:    SpO2: (!) 89% 95%    Filed Weights   10/19/18 0500 10/23/18 0541 10/26/18 0500  Weight: 52.9 kg 65.3 kg 64.8 kg    ROS: Review of Systems  Constitutional: Negative for chills and fever.  Eyes: Negative for blurred vision.  Respiratory: Negative for cough and shortness of breath.   Cardiovascular: Negative for chest pain.  Gastrointestinal: Negative for abdominal pain, constipation, diarrhea, nausea and vomiting.  Genitourinary: Negative for dysuria.  Musculoskeletal: Positive for joint pain.  Neurological: Negative for dizziness and headaches.   Exam: Physical Exam  Constitutional: He is oriented to person, place, and time. He appears distressed.  HENT:  Nose: No mucosal edema.  Mouth/Throat: No oropharyngeal exudate or posterior oropharyngeal edema.  Eyes: Pupils are equal, round, and reactive to light. Conjunctivae, EOM and lids are normal.  Neck: No JVD present. Carotid bruit is not present. No edema present. No thyroid mass and no thyromegaly present.  Cardiovascular: S1 normal and S2 normal. Tachycardia present. Exam reveals no gallop.  No murmur heard. Pulses:      Dorsalis pedis pulses are 2+ on the left side.  Respiratory: No respiratory distress. He has no wheezes. He has no rhonchi. He has no rales.  GI: Soft. Bowel sounds are normal. There is no abdominal tenderness.  Musculoskeletal:     Right shoulder: He exhibits no swelling.  Lymphadenopathy:    He has no cervical adenopathy.  Neurological: He is alert and oriented to person, place, and time. No cranial nerve deficit.  Skin: Skin is warm. Nails show  no clubbing.  Right lower extremity amputation site covered in pressure dressing.  Left foot prior amputation site healed well.  Psychiatric: He has a normal mood and affect.      Data Reviewed: Basic Metabolic Panel: Recent Labs  Lab 10/21/18 0440 10/22/18 0531 10/23/18 0528 10/24/18 0429 10/25/18 0320 10/26/18 0534  NA 133* 131* 138 134* 136 134*  K 4.0 4.6 5.1 4.6 4.0 3.5  CL 103 101 108 108 108 104  CO2 23 21* 22 17* 18* 19*  GLUCOSE 109* 109* 224* 81 80 131*  BUN 55* 66* 73* 86* 74* 72*  CREATININE 2.78* 2.83* 3.26* 3.44* 3.29* 3.75*  CALCIUM 7.8* 8.1* 8.3* 7.7* 8.0* 7.8*  MG 1.9 2.2 2.1 2.0  --  1.9   CBC: Recent Labs  Lab 10/22/18 0531 10/23/18 0528 10/24/18 0429 10/24/18 1101 10/25/18 0320 10/26/18 0534  WBC 15.3* 10.9* 20.3*  --  22.8* 21.2*  HGB 9.4* 9.8* 7.6* 7.3* 7.6* 6.9*  HCT 28.9* 31.6* 25.1* 23.4* 24.4* 22.0*  MCV 86.3 88.5 91.6  --  89.4 88.0  PLT 252 311 264  --  320 387    CBG: Recent Labs  Lab 10/25/18 1427 10/25/18 1559 10/25/18 2337 10/26/18 0448 10/26/18 0723  GLUCAP 193* 148* 174* 134* 72    Scheduled Meds: . calcium carbonate  500 mg Oral BID WC  . enoxaparin (LOVENOX) injection  30 mg Subcutaneous Q24H  . epoetin (EPOGEN/PROCRIT) injection  20,000 Units Subcutaneous Weekly  . famotidine  20 mg Oral Daily  .  feeding supplement (GLUCERNA SHAKE)  237 mL Oral TID BM  . ferrous sulfate  325 mg Oral BID WC  . gabapentin  300 mg Oral BID  . insulin aspart  0-9 Units Subcutaneous Q4H  . insulin glargine  12 Units Subcutaneous BID  . ipratropium-albuterol  3 mL Nebulization Q4H  . mouth rinse  15 mL Mouth Rinse BID  . metoprolol tartrate  12.5 mg Oral BID  . multivitamin with minerals  1 tablet Oral Daily  . polyethylene glycol  17 g Oral Daily  . vitamin B-12  5,000 mcg Oral Daily  . ascorbic acid  250 mg Oral BID  . zolpidem  10 mg Oral QHS   Continuous Infusions: . sodium chloride Stopped (10/25/18 0514)  . furosemide  (LASIX) infusion 4 mg/hr (10/26/18 0907)  . piperacillin-tazobactam (ZOSYN)  IV 3.375 g (10/26/18 0553)    Assessment/Plan:  1. Acute respiratory failure due to aspiration pneumonia requiring oxygen continue IV antibiotic, chest x-ray with improvement 2. fever due to aspiration pneumonia continue Zosyn COVID ruled out  3. postoperative anemia.  Hemoglobin dropped again will need transfusion 4. 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  Patient had right leg gangrene which was the reason for amputation.  Continue oral and IV pain medications  5. uncontrolled type 2 diabetes mellitus labile regimen being adjusted due to hypoglycemia 6. Acute chronic kidney disease stage III.  Neurology following 7. Diabetic neuropathy on gabapentin Constipation continue current regimen    Code Status:     Code Status Orders  (From admission, onward)         Start     Ordered   10/18/18 1237  Full code  Continuous     10/18/18 1242        Code Status History    Date Active Date Inactive Code Status Order ID Comments User Context   10/11/2018 1157 10/13/2018 1654 Full Code [id number]  Hillary Bow, MD Inpatient   09/19/2018 2049 09/21/2018 1851 Full Code [id number]  Saundra Shelling, MD Inpatient   08/16/2018 1203 08/18/2018 2142 Full Code [id number]  Hillary Bow, MD ED   07/16/2018 1739 07/18/2018 0104 Full Code [id number]  Dustin Flock, MD Inpatient   07/10/2018 0111 07/11/2018 1955 Full Code [id number]  Salary, Avel Peace, MD Inpatient   06/14/2018 1744 06/20/2018 2123 Full Code [id number]  Loletha Grayer, MD ED   05/28/2018 0437 05/28/2018 1926 Full Code [id number]  Arta Silence, MD ED   04/27/2018 1726 05/02/2018 1713 Full Code [id number]  Henreitta Leber, MD Inpatient   04/05/2018 1724 04/11/2018 2142 Full Code [id number]  Dustin Flock, MD Inpatient   02/06/2018 2022 02/07/2018 1512 Full Code [id number]  Demetrios Loll, MD Inpatient   01/10/2018 2259 01/11/2018 1947 Full Code [id number]   Amelia Jo, MD Inpatient   12/30/2017 0019 01/03/2018 1457 Full Code [id number]  Harrie Foreman, MD Inpatient   12/20/2017 1541 12/27/2017 2147 Full Code [id number]  Sharlotte Alamo, [facility] Inpatient   11/10/2017 0049 11/11/2017 1632 Full Code [id number]  Amelia Jo, MD Inpatient   07/13/2017 2112 07/15/2017 1730 Full Code [id number]  Jules Husbands, MD ED      Disposition Plan: To be determined  Consultants:  Vascular surgery  Procedures:  Right leg below-knee amputation  Time spent: 45min critical care time  [employer]

## 2018-10-27 ENCOUNTER — Inpatient Hospital Stay: Payer: Medicaid Other

## 2018-10-27 DIAGNOSIS — Z89429 Acquired absence of other toe(s), unspecified side: Secondary | ICD-10-CM

## 2018-10-27 DIAGNOSIS — E118 Type 2 diabetes mellitus with unspecified complications: Secondary | ICD-10-CM

## 2018-10-27 DIAGNOSIS — Z8619 Personal history of other infectious and parasitic diseases: Secondary | ICD-10-CM

## 2018-10-27 LAB — CBC WITH DIFFERENTIAL/PLATELET
Abs Immature Granulocytes: 0.13 10*3/uL — ABNORMAL HIGH (ref 0.00–0.07)
Basophils Absolute: 0 10*3/uL (ref 0.0–0.1)
Basophils Relative: 0 %
Eosinophils Absolute: 0.1 10*3/uL (ref 0.0–0.5)
Eosinophils Relative: 1 %
HCT: 18.7 % — ABNORMAL LOW (ref 39.0–52.0)
Hemoglobin: 5.6 g/dL — ABNORMAL LOW (ref 13.0–17.0)
Immature Granulocytes: 1 %
Lymphocytes Relative: 9 %
Lymphs Abs: 0.9 10*3/uL (ref 0.7–4.0)
MCH: 26.7 pg (ref 26.0–34.0)
MCHC: 29.9 g/dL — ABNORMAL LOW (ref 30.0–36.0)
MCV: 89 fL (ref 80.0–100.0)
Monocytes Absolute: 0.4 10*3/uL (ref 0.1–1.0)
Monocytes Relative: 4 %
Neutro Abs: 8.9 10*3/uL — ABNORMAL HIGH (ref 1.7–7.7)
Neutrophils Relative %: 85 %
Platelets: 358 10*3/uL (ref 150–400)
RBC: 2.1 MIL/uL — ABNORMAL LOW (ref 4.22–5.81)
RDW: 14.3 % (ref 11.5–15.5)
WBC: 10.5 10*3/uL (ref 4.0–10.5)
nRBC: 0.2 % (ref 0.0–0.2)

## 2018-10-27 LAB — BASIC METABOLIC PANEL
Anion gap: 16 — ABNORMAL HIGH (ref 5–15)
Anion gap: 15 (ref 5–15)
BUN: 82 mg/dL — ABNORMAL HIGH (ref 6–20)
BUN: 82 mg/dL — ABNORMAL HIGH (ref 6–20)
CO2: 19 mmol/L — ABNORMAL LOW (ref 22–32)
CO2: 21 mmol/L — ABNORMAL LOW (ref 22–32)
Calcium: 7.9 mg/dL — ABNORMAL LOW (ref 8.9–10.3)
Calcium: 7.8 mg/dL — ABNORMAL LOW (ref 8.9–10.3)
Chloride: 102 mmol/L (ref 98–111)
Chloride: 103 mmol/L (ref 98–111)
Creatinine, Ser: 4.46 mg/dL — ABNORMAL HIGH (ref 0.61–1.24)
Creatinine, Ser: 4.66 mg/dL — ABNORMAL HIGH (ref 0.61–1.24)
GFR calc Af Amer: 19 mL/min — ABNORMAL LOW (ref >60)
GFR calc Af Amer: 18 mL/min — ABNORMAL LOW (ref >60)
GFR calc non Af Amer: 16 mL/min — ABNORMAL LOW (ref >60)
GFR calc non Af Amer: 16 mL/min — ABNORMAL LOW (ref >60)
Glucose, Bld: 206 mg/dL — ABNORMAL HIGH (ref 70–99)
Glucose, Bld: 179 mg/dL — ABNORMAL HIGH (ref 70–99)
Potassium: 3.5 mmol/L (ref 3.5–5.1)
Potassium: 3.5 mmol/L (ref 3.5–5.1)
Sodium: 137 mmol/L (ref 135–145)
Sodium: 139 mmol/L (ref 135–145)

## 2018-10-27 LAB — BLOOD GAS, ARTERIAL
Acid-base deficit: 8.3 mmol/L — ABNORMAL HIGH (ref 0.0–2.0)
Bicarbonate: 18.4 mmol/L — ABNORMAL LOW (ref 20.0–28.0)
FIO2: 0.5
MECHVT: 450 mL
Mechanical Rate: 24
O2 Saturation: 99.4 %
PEEP: 10 cmH2O
Patient temperature: 37
pCO2 arterial: 41 mmHg (ref 32.0–48.0)
pH, Arterial: 7.26 — ABNORMAL LOW (ref 7.350–7.450)
pO2, Arterial: 179 mmHg — ABNORMAL HIGH (ref 83.0–108.0)

## 2018-10-27 LAB — CBC
HCT: 24.6 % — ABNORMAL LOW (ref 39.0–52.0)
Hemoglobin: 8.2 g/dL — ABNORMAL LOW (ref 13.0–17.0)
MCH: 28.3 pg (ref 26.0–34.0)
MCHC: 33.3 g/dL (ref 30.0–36.0)
MCV: 84.8 fL (ref 80.0–100.0)
Platelets: 393 10*3/uL (ref 150–400)
RBC: 2.9 MIL/uL — ABNORMAL LOW (ref 4.22–5.81)
RDW: 14.6 % (ref 11.5–15.5)
WBC: 13 10*3/uL — ABNORMAL HIGH (ref 4.0–10.5)
nRBC: 0 % (ref 0.0–0.2)

## 2018-10-27 LAB — BETA-HYDROXYBUTYRIC ACID: Beta-Hydroxybutyric Acid: 0.25 mmol/L (ref 0.05–0.27)

## 2018-10-27 LAB — COMPREHENSIVE METABOLIC PANEL
ALT: 19 U/L (ref 0–44)
AST: 18 U/L (ref 15–41)
Albumin: 1.4 g/dL — ABNORMAL LOW (ref 3.5–5.0)
Alkaline Phosphatase: 164 U/L — ABNORMAL HIGH (ref 38–126)
Anion gap: 16 — ABNORMAL HIGH (ref 5–15)
BUN: 79 mg/dL — ABNORMAL HIGH (ref 6–20)
CO2: 17 mmol/L — ABNORMAL LOW (ref 22–32)
Calcium: 7.9 mg/dL — ABNORMAL LOW (ref 8.9–10.3)
Chloride: 104 mmol/L (ref 98–111)
Creatinine, Ser: 4.28 mg/dL — ABNORMAL HIGH (ref 0.61–1.24)
GFR calc Af Amer: 20 mL/min — ABNORMAL LOW (ref >60)
GFR calc non Af Amer: 17 mL/min — ABNORMAL LOW (ref >60)
Glucose, Bld: 294 mg/dL — ABNORMAL HIGH (ref 70–99)
Potassium: 3.1 mmol/L — ABNORMAL LOW (ref 3.5–5.1)
Sodium: 137 mmol/L (ref 135–145)
Total Bilirubin: 0.3 mg/dL (ref 0.3–1.2)
Total Protein: 5.6 g/dL — ABNORMAL LOW (ref 6.5–8.1)

## 2018-10-27 LAB — MAGNESIUM
Magnesium: 2 mg/dL (ref 1.7–2.4)
Magnesium: 1.9 mg/dL (ref 1.7–2.4)

## 2018-10-27 LAB — PHOSPHORUS
Phosphorus: 6.8 mg/dL — ABNORMAL HIGH (ref 2.5–4.6)
Phosphorus: 6.7 mg/dL — ABNORMAL HIGH (ref 2.5–4.6)

## 2018-10-27 LAB — GLUCOSE, CAPILLARY
Glucose-Capillary: 160 mg/dL — ABNORMAL HIGH (ref 70–99)
Glucose-Capillary: 177 mg/dL — ABNORMAL HIGH (ref 70–99)
Glucose-Capillary: 182 mg/dL — ABNORMAL HIGH (ref 70–99)
Glucose-Capillary: 190 mg/dL — ABNORMAL HIGH (ref 70–99)
Glucose-Capillary: 198 mg/dL — ABNORMAL HIGH (ref 70–99)
Glucose-Capillary: 290 mg/dL — ABNORMAL HIGH (ref 70–99)

## 2018-10-27 LAB — PREPARE RBC (CROSSMATCH)

## 2018-10-27 LAB — PROCALCITONIN: Procalcitonin: 44.88 ng/mL

## 2018-10-27 LAB — HEMOGLOBIN AND HEMATOCRIT, BLOOD
HCT: 21.6 % — ABNORMAL LOW (ref 39.0–52.0)
Hemoglobin: 6.9 g/dL — ABNORMAL LOW (ref 13.0–17.0)

## 2018-10-27 LAB — ANA W/REFLEX IF POSITIVE: Anti Nuclear Antibody (ANA): NEGATIVE

## 2018-10-27 LAB — LACTATE DEHYDROGENASE: LDH: 330 U/L — ABNORMAL HIGH (ref 98–192)

## 2018-10-27 MED ORDER — VITAL 1.5 CAL PO LIQD
1000.0000 mL | ORAL | Status: DC
  Administered 2018-10-27 – 2018-10-29 (×2): 1000 mL

## 2018-10-27 MED ORDER — LORAZEPAM 2 MG/ML IJ SOLN
INTRAMUSCULAR | Status: AC
  Administered 2018-10-27: 12:00:00 2 mg via INTRAVENOUS
  Filled 2018-10-27: qty 1

## 2018-10-27 MED ORDER — PANTOPRAZOLE SODIUM 40 MG PO PACK
40.0000 mg | PACK | Freq: Two times a day (BID) | ORAL | Status: DC
  Administered 2018-10-27 – 2018-10-30 (×6): 40 mg
  Filled 2018-10-27 (×5): qty 20

## 2018-10-27 MED ORDER — METOPROLOL TARTRATE 5 MG/5ML IV SOLN
2.5000 mg | Freq: Once | INTRAVENOUS | Status: AC
  Administered 2018-10-27: 18:00:00 2.5 mg via INTRAVENOUS
  Filled 2018-10-27: qty 5

## 2018-10-27 MED ORDER — PRO-STAT SUGAR FREE PO LIQD
30.0000 mL | Freq: Every day | ORAL | Status: DC
  Administered 2018-10-28 – 2018-10-30 (×3): 30 mL

## 2018-10-27 MED ORDER — INSULIN ASPART 100 UNIT/ML ~~LOC~~ SOLN
0.0000 [IU] | SUBCUTANEOUS | Status: DC
  Administered 2018-10-27 (×2): 3 [IU] via SUBCUTANEOUS
  Administered 2018-10-27: 8 [IU] via SUBCUTANEOUS
  Administered 2018-10-27 (×2): 3 [IU] via SUBCUTANEOUS
  Administered 2018-10-28: 5 [IU] via SUBCUTANEOUS
  Administered 2018-10-28: 3 [IU] via SUBCUTANEOUS
  Administered 2018-10-28: 08:00:00 2 [IU] via SUBCUTANEOUS
  Administered 2018-10-28: 20:00:00 5 [IU] via SUBCUTANEOUS
  Administered 2018-10-28 (×2): 3 [IU] via SUBCUTANEOUS
  Administered 2018-10-29: 20:00:00 2 [IU] via SUBCUTANEOUS
  Administered 2018-10-29 (×2): 3 [IU] via SUBCUTANEOUS
  Administered 2018-10-29 – 2018-10-30 (×3): 2 [IU] via SUBCUTANEOUS
  Filled 2018-10-27 (×16): qty 1

## 2018-10-27 MED ORDER — POTASSIUM CHLORIDE 20 MEQ PO PACK
20.0000 meq | PACK | Freq: Once | ORAL | Status: AC
  Administered 2018-10-27: 11:00:00 20 meq
  Filled 2018-10-27: qty 1

## 2018-10-27 MED ORDER — HYDRALAZINE HCL 20 MG/ML IJ SOLN
10.0000 mg | INTRAMUSCULAR | Status: DC | PRN
  Administered 2018-10-28 – 2018-10-30 (×3): 10 mg via INTRAVENOUS
  Administered 2018-10-31: 20 mg via INTRAVENOUS
  Filled 2018-10-27 (×5): qty 1

## 2018-10-27 MED ORDER — METOPROLOL TARTRATE 5 MG/5ML IV SOLN
2.5000 mg | Freq: Four times a day (QID) | INTRAVENOUS | Status: DC | PRN
  Administered 2018-10-27 – 2018-10-28 (×4): 5 mg via INTRAVENOUS
  Filled 2018-10-27 (×4): qty 5

## 2018-10-27 MED ORDER — LORAZEPAM 2 MG/ML IJ SOLN
1.0000 mg | INTRAMUSCULAR | Status: DC | PRN
  Administered 2018-10-27: 12:00:00 2 mg via INTRAVENOUS
  Administered 2018-10-29: 1 mg via INTRAVENOUS
  Filled 2018-10-27: qty 1

## 2018-10-27 MED ORDER — INSULIN GLARGINE 100 UNIT/ML ~~LOC~~ SOLN
14.0000 [IU] | Freq: Two times a day (BID) | SUBCUTANEOUS | Status: DC
  Administered 2018-10-27 – 2018-10-29 (×5): 14 [IU] via SUBCUTANEOUS
  Filled 2018-10-27 (×8): qty 0.14

## 2018-10-27 MED ORDER — SODIUM CHLORIDE 0.9% IV SOLUTION
Freq: Once | INTRAVENOUS | Status: AC
  Administered 2018-10-27: 10:00:00 via INTRAVENOUS

## 2018-10-27 MED ORDER — STERILE WATER FOR INJECTION IV SOLN
INTRAVENOUS | Status: DC
  Administered 2018-10-27 – 2018-10-29 (×4): via INTRAVENOUS
  Filled 2018-10-27 (×7): qty 850

## 2018-10-27 MED ORDER — SODIUM CHLORIDE 0.9% IV SOLUTION
Freq: Once | INTRAVENOUS | Status: AC
  Administered 2018-10-27: 18:00:00 via INTRAVENOUS

## 2018-10-27 NOTE — Progress Notes (Signed)
Nutrition Follow-up  RD working remotely.  DOCUMENTATION CODES:   Severe malnutrition in context of chronic illness  INTERVENTIONS:  Vital 1.5 @ goal rate of 28ml/hr + PS 28ml daily via tube   Free water flushes 15ml q4 hours to maintain tube patency   Regimen provides 1720kcal/day, 88g/day protein, 1080g/day phosphorus, 1063ml/day free water.   Regimen at goal meets 100% of pt's micronutrient needs.   NUTRITION DIAGNOSIS:   Severe Malnutrition related to chronic illness(uncontrolled DM) as evidenced by moderate fat depletion, severe fat depletion, moderate muscle depletion, severe muscle depletion.  GOAL:   Provide needs based on ASPEN/SCCM guidelines  MONITOR:   Vent status, Labs, Weight trends, Skin, I & O's, TF tolerance  ASSESSMENT:   33 y.o. male with type I DM since age 34yrs, IBS, gastroparesis, CKD III admitted with R foot ulcers now s/p R BKA 4/15   Pt required intubation and ventilation secondary to pulmonary edema and PNA. OGT in place. Will plan to start tube feeds today. Pt up ~20lbs from his admit weight; will use admit weight to calculate needs.    Medications reviewed and include: epogen, insulin, MVI, protonix, miralax, B12, Vitamin C, fentanyl, zosyn, NaBicarb  Labs reviewed: K 3.1(L), BUN 79(H), creat 4.28(H), P 6.8(H), Mg 2.0 wnl Hgb 5.6(L), Hct 18.7(L) cbgs- 290, 190 x 24 hrs  Patient is currently intubated on ventilator support MV: 9.7 L/min Temp (24hrs), Avg:98.8 F (37.1 C), Min:98.1 F (36.7 C), Max:100.2 F (37.9 C)  Propofol: none   MAP- >70mmHg  UOP- 1312ml   Diet Order:   Diet Order    None     EDUCATION NEEDS:   Not appropriate for education at this time  Skin:  Skin Assessment: Reviewed RN Assessment(incision s/p R BKA, Stage II sacrum)  Last BM:  10/22/2018 - large type 6  Height:   Ht Readings from Last 1 Encounters:  10/18/18 5\' 6"  (1.676 m)   Weight:   Wt Readings from Last 1 Encounters:  10/27/18 62.5 kg    Ideal Body Weight:  60.6 kg(adjusted for BKA)  BMI:  Body mass index is 22.24 kg/m.  Estimated Nutritional Needs:   Kcal:  1791kcal/day   Protein:  85-95g/day   Fluid:  >1.5L/day    [name] MS, RD, LDN Pager #- [phone] Office#- [phone] After Hours Pager: [phone]

## 2018-10-27 NOTE — Progress Notes (Signed)
Patient ETT tube advanced to 24cm at the lip.  Patient tolerated well.

## 2018-10-27 NOTE — Progress Notes (Signed)
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 Delana Meyer.  Postop course complicated by anemia requiring blood transfusion.  Hospital course is further complicated by acute  hypoxia with worsening bilateral infiltrate. Acute rep failure requiring intubation /Vent support 10/26/18   Vent assisted K is low UOP 1395 cc   Objective:  Vital signs in last 24 hours:  Temp:  [98.1 F (36.7 C)-100.2 F (37.9 C)] 99.1 F (37.3 C) (04/24 0930) Pulse Rate:  [99-120] 110 (04/24 0930) Resp:  [0-27] 24 (04/24 0930) BP: (99-162)/(58-100) 112/63 (04/24 0930) SpO2:  [93 %-100 %] 100 % (04/24 0930) FiO2 (%):  [40 %-100 %] 40 % (04/24 0756) Weight:  [62.5 kg] 62.5 kg (04/24 0500)  Weight change: -2.3 kg Filed Weights   10/23/18 0541 10/26/18 0500 10/27/18 0500  Weight: 65.3 kg 64.8 kg 62.5 kg    Intake/Output:    Intake/Output Summary (Last 24 hours) at 10/27/2018 1011 Last data filed at 10/27/2018 0814 Gross per 24 hour  Intake 1038.57 ml  Output 845 ml  Net 193.57 ml     Physical Exam: General:  Chronically ill-appearing, thin  HEENT  anicteric, dry oral mucous membranes  Neck  supple  Pulm/lungs  bilateral diffuse crackles, Vent assisted  CVS/Heart  tachycardic, regular  Abdomen:   Soft, nontender  Extremities:  Right BKA, no edema on left  Neurologic:  sedated  Skin:  No acute rashes    Foley in place       Basic Metabolic Panel:  Recent Labs  Lab 10/22/18 0531 10/23/18 0528 10/24/18 0429 10/25/18 0320 10/26/18 0534 10/27/18 0451  NA 131* 138 134* 136 134* 137  K 4.6 5.1 4.6 4.0 3.5 3.1*  CL 101 108 108 108 104 104  CO2 21* 22 17* 18* 19* 17*  GLUCOSE 109* 224* 81 80 131* 294*  BUN 66* 73* 86* 74* 72* 79*  CREATININE 2.83* 3.26* 3.44* 3.29* 3.75* 4.28*  CALCIUM 8.1* 8.3* 7.7* 8.0* 7.8* 7.9*  MG 2.2 2.1 2.0  --  1.9  2.0  PHOS  --   --   --   --   --  6.8*     CBC: Recent Labs  Lab 10/23/18 0528 10/24/18 0429 10/24/18 1101 10/25/18 0320 10/26/18 0534 10/27/18 0451  WBC 10.9* 20.3*  --  22.8* 21.2* 10.5  NEUTROABS  --   --   --   --   --  8.9*  HGB 9.8* 7.6* 7.3* 7.6* 6.9* 5.6*  HCT 31.6* 25.1* 23.4* 24.4* 22.0* 18.7*  MCV 88.5 91.6  --  89.4 88.0 89.0  PLT 311 264  --  320 387 358     No results found for: HEPBSAG, HEPBSAB, HEPBIGM    Microbiology:  Recent Results (from the past 240 hour(s))  Culture, blood (x 2)     Status: None (Preliminary result)   Collection Time: 10/23/18  6:49 AM  Result Value Ref Range Status   Specimen Description BLOOD RIGHT ARM  Final   Special Requests   Final    BOTTLES DRAWN AEROBIC AND ANAEROBIC Blood Culture results may not be optimal due to an excessive volume of blood received in culture bottles   Culture   Final    NO GROWTH 4 DAYS Performed at [facility], [address] [place], [place] [address]    Report Status PENDING  Incomplete  Culture, blood (x 2)     Status: None (Preliminary result)   Collection Time: 10/23/18  6:49 AM  Result Value Ref Range Status   Specimen Description BLOOD LEFT WRIST  Final   Special Requests BOTTLES DRAWN AEROBIC AND ANAEROBIC [place]  Final   Culture   Final    NO GROWTH 4 DAYS Performed at [facility], [address], [place], [place] [address]    Report Status PENDING  Incomplete  MRSA PCR Screening     Status: None   Collection Time: 10/23/18  7:40 AM  Result Value Ref Range Status   MRSA by PCR NEGATIVE NEGATIVE Final    Comment:        The GeneXpert MRSA Assay (FDA approved for NASAL specimens only), is one component of a comprehensive MRSA colonization surveillance program. It is not intended to diagnose MRSA infection nor to guide or monitor treatment for MRSA infections. Performed at [facility], [address] [place], [place] [address]   SARS  Coronavirus 2 [facility] order, Performed in [facility] hospital lab)     Status: None   Collection Time: 10/23/18  6:06 PM  Result Value Ref Range Status   SARS Coronavirus 2 NEGATIVE NEGATIVE Final    Comment: (NOTE) If result is NEGATIVE SARS-CoV-2 target nucleic acids are NOT DETECTED. The SARS-CoV-2 RNA is generally detectable in upper and lower  respiratory specimens during the acute phase of infection. The lowest  concentration of SARS-CoV-2 viral copies this assay can detect is 250  copies / mL. A negative result does not preclude SARS-CoV-2 infection  and should not be used as the sole basis for treatment or other  patient management decisions.  A negative result may occur with  improper specimen collection / handling, submission of specimen other  than nasopharyngeal swab, presence of viral mutation(s) within the  areas targeted by this assay, and inadequate number of viral copies  (<250 copies / mL). A negative result must be combined with clinical  observations, patient history, and epidemiological information. If result is POSITIVE SARS-CoV-2 target nucleic acids are DETECTED. The SARS-CoV-2 RNA is generally detectable in upper and lower  respiratory specimens dur ing the acute phase of infection.  Positive  results are indicative of active infection with SARS-CoV-2.  Clinical  correlation with patient history and other diagnostic information is  necessary to determine patient infection status.  Positive results do  not rule out bacterial infection or co-infection with other viruses. If result is PRESUMPTIVE POSTIVE SARS-CoV-2 nucleic acids MAY BE PRESENT.   A presumptive positive result was obtained on the submitted specimen  and confirmed on repeat testing.  While 2019 novel coronavirus  (SARS-CoV-2) nucleic acids may be present in the submitted sample  additional confirmatory testing may be necessary for epidemiological  and / or clinical management purposes  to  differentiate between  SARS-CoV-2 and other Sarbecovirus currently known to infect humans.  If clinically indicated additional testing with an alternate test  methodology [phone]) is advised. The SARS-CoV-2 RNA is generally  detectable in upper and lower respiratory sp ecimens during the acute  phase of infection. The expected result is Negative. Fact Sheet for Patients:  StrictlyIdeas.no Fact Sheet for Healthcare Providers: BankingDealers.co.za This test is not yet approved or cleared by the [place] FDA and has been authorized for detection and/or diagnosis of SARS-CoV-2 by FDA under an Emergency Use Authorization (EUA).  This EUA will remain in effect (meaning this test can  be used) for the duration of the COVID-19 declaration under Section 564(b)(1) of the Act, 21 U.S.C. section 360bbb-3(b)(1), unless the authorization is terminated or revoked sooner. Performed at [facility], [address] [place], [place] [address]   Respiratory Panel by PCR     Status: None   Collection Time: 10/24/18  9:17 AM  Result Value Ref Range Status   Adenovirus NOT DETECTED NOT DETECTED Final   Coronavirus 229E NOT DETECTED NOT DETECTED Final    Comment: (NOTE) The Coronavirus on the Respiratory Panel, DOES NOT test for the novel  Coronavirus (2019 nCoV)    Coronavirus HKU1 NOT DETECTED NOT DETECTED Final   Coronavirus NL63 NOT DETECTED NOT DETECTED Final   Coronavirus OC43 NOT DETECTED NOT DETECTED Final   Metapneumovirus NOT DETECTED NOT DETECTED Final   Rhinovirus / Enterovirus NOT DETECTED NOT DETECTED Final   Influenza A NOT DETECTED NOT DETECTED Final   Influenza B NOT DETECTED NOT DETECTED Final   Parainfluenza Virus 1 NOT DETECTED NOT DETECTED Final   Parainfluenza Virus 2 NOT DETECTED NOT DETECTED Final   Parainfluenza Virus 3 NOT DETECTED NOT DETECTED Final   Parainfluenza Virus 4 NOT DETECTED NOT DETECTED Final    Respiratory Syncytial Virus NOT DETECTED NOT DETECTED Final   Bordetella pertussis NOT DETECTED NOT DETECTED Final   Chlamydophila pneumoniae NOT DETECTED NOT DETECTED Final   Mycoplasma pneumoniae NOT DETECTED NOT DETECTED Final    Comment: Performed at [facility], [place]. [address] [place], [place] [address]    Coagulation Studies: No results for input(s): LABPROT, INR in the last 72 hours.  Urinalysis: No results for input(s): COLORURINE, LABSPEC, PHURINE, GLUCOSEU, HGBUR, BILIRUBINUR, KETONESUR, PROTEINUR, UROBILINOGEN, NITRITE, LEUKOCYTESUR in the last 72 hours.  Invalid input(s): APPERANCEUR    Imaging: Dg Abd 1 View  Result Date: 10/26/2018 CLINICAL DATA:  Orogastric tube placement. EXAM: ABDOMEN - 1 VIEW COMPARISON:  None. FINDINGS: The bowel gas pattern is normal. Distal tip of orogastric tube is seen in expected position of distal stomach. No radio-opaque calculi or other significant radiographic abnormality are seen. IMPRESSION: Distal tip of orogastric tube is seen in expected position of distal stomach. No evidence of bowel obstruction or ileus. Electronically Signed   By: Marijo Conception M.D.   On: 10/26/2018 13:32   Dg Chest Port 1 View  Result Date: 10/27/2018 CLINICAL DATA:  33 year old male with respiratory failure negative for COVID-19 on 10/23/2018. Recent right BKA EXAM: PORTABLE CHEST 1 VIEW COMPARISON:  10/26/2018 and earlier. FINDINGS: Portable AP upright view at 0505 hours. Endotracheal tube tip has been pulled back and now projects just above the clavicles. Enteric tube courses to the abdomen as before, tip not included. Normal cardiac size and mediastinal contours. Coarse bilateral pulmonary interstitial opacity has regressed since 0337 hours yesterday. No pneumothorax or pleural effusion identified. No areas of worsening ventilation. IMPRESSION: 1. Endotracheal tube tip has been pulled back and now projects just above the clavicles. Stable enteric tube. 2.  Mildly regressed bilateral pulmonary interstitial opacity since yesterday with no new cardiopulmonary abnormality. Electronically Signed   By: Genevie Ann M.D.   On: 10/27/2018 07:46   Dg Chest Port 1 View  Result Date: 10/26/2018 CLINICAL DATA:  Endotracheal tube placement. EXAM: PORTABLE CHEST 1 VIEW COMPARISON:  Radiograph of same day. FINDINGS: The heart size and mediastinal contours are within normal limits. Endotracheal tube is directed into right mainstem bronchus; withdrawal by 3-4 cm is recommended. Nasogastric tube is seen entering  stomach. Bilateral patchy airspace opacities are noted in both lungs most consistent with multifocal pneumonia. The visualized skeletal structures are unremarkable. IMPRESSION: Endotracheal tube is directed into right mainstem bronchus; withdrawal by 3-4 cm is recommended. Nasogastric tube is seen entering stomach. Stable bilateral lung opacities are noted most consistent with multifocal pneumonia. These results will be called to the ordering clinician or representative by the Radiologist Assistant, and communication documented in the PACS or zVision Dashboard. Electronically Signed   By: Marijo Conception M.D.   On: 10/26/2018 13:31   Dg Chest Port 1 View  Result Date: 10/26/2018 CLINICAL DATA:  33 year old male with respiratory failure. Negative for COVID-19 on 10/23/2018. History of diabetes with complications, recent right BKA. EXAM: PORTABLE CHEST 1 VIEW COMPARISON:  10/25/2018 and earlier. FINDINGS: Portable AP semi upright view at 0337 hours. Course nodular and confluent bilateral pulmonary opacity throughout both lungs. Ventilation has improved since 10/24/2018, and right lower lung ventilation appears slightly improved since yesterday. No superimposed pneumothorax or pleural effusion. Mediastinal contours remain normal. Visualized tracheal air column is within normal limits. Negative visible bowel gas pattern. IMPRESSION: 1. Continued confluent bilateral pulmonary  opacity with improved ventilation since 10/24/2018, slightly improved in the right lower lung since yesterday. 2. No new cardiopulmonary abnormality. Electronically Signed   By: Genevie Ann M.D.   On: 10/26/2018 05:58     Medications:   . sodium chloride Stopped (10/26/18 2051)  . fentaNYL infusion INTRAVENOUS 225 mcg/hr (10/27/18 0619)  . piperacillin-tazobactam (ZOSYN)  IV 12.5 mL/hr at 10/27/18 0619  .  sodium bicarbonate (isotonic) infusion in sterile water 75 mL/hr at 10/27/18 0619   . chlorhexidine gluconate (MEDLINE KIT)  15 mL Mouth Rinse BID  . epoetin (EPOGEN/PROCRIT) injection  20,000 Units Subcutaneous Weekly  . famotidine  20 mg Per Tube Daily  . insulin aspart  0-15 Units Subcutaneous Q4H  . insulin glargine  14 Units Subcutaneous BID  . ipratropium-albuterol  3 mL Nebulization Q4H  . mouth rinse  15 mL Mouth Rinse 10 times per day  . multivitamin  15 mL Per Tube Daily  . polyethylene glycol  17 g Oral Daily  . vitamin B-12  5,000 mcg Oral Daily  . ascorbic acid  250 mg Per Tube BID   sodium chloride, acetaminophen **OR** acetaminophen, ALPRAZolam, dextrose, ipratropium-albuterol, magnesium hydroxide  Assessment/ Plan:  33 y.o. 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 2D echo from December 2019 shows moderate concentric hypertrophy, EF 50%, diffuse hypokinesis, grade 1 diastolic dysfunction  1.  Acute kidney injury on chronic kidney disease stage III Baseline creatinine of 1.8/GFR 49 from February 2020 Acute kidney injury appears to be multifactorial  ANA, ANCA are negative; complements normal - UOP about 1400 cc last 24 hrs - S Cr and BUN are worsening - If the trend continues or patient becomes anuric, may need HD -  Electrolytes and Volume status are acceptable No acute indication for Dialysis at present   2.  Acute hypoxic respiratory failure Unclear cause but possibly volume overload versus pneumonia.  Patient is getting IV antibiotics empirically Consider acute lung injury secondary to transfusion - Vent assisted. Since 4/23  3.  Diabetes type 1 with CKD Diabetes is poorly controlled Hemoglobin A1c 13.7% on September 20, 2018  4.  Anemia Of blood loss and  possibly chronic kidney disease -Continue subcu EPO weekly    LOS: [place] 4/24/202010:11 AM  [place], [place]  Note: This note was prepared with Dragon dictation. Any transcription errors are unintentional

## 2018-10-27 NOTE — Progress Notes (Signed)
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 Treated with PCA for pain, control FSBS 4/16 1 unit PRBC transfused 4/18 2 units PRBC's transfused 4/20 progressive hypoxia with acute b/l infiltrates then transferred to [place] for further care and management 4/22 remains hypoxic  4/23 remains hypoxic DX TACO 4/23 severe hypoxia near cardiac arrest, intubated on VENT  REVIEW OF SYSTEMS  PATIENT IS UNABLE TO PROVIDE COMPLETE REVIEW OF SYSTEM S DUE TO SEVERE CRITICAL ILLNESS AND ENCEPHALOPATHY     VITAL SIGNS: Temp:  [98.1 F (36.7 C)-100.2 F (37.9 C)] 98.3 F (36.8 C) (04/24 0400) Pulse Rate:  [99-123] 101 (04/24 0600) Resp:  [0-27] 22 (04/24 0600) BP: (99-162)/(58-100) 109/65 (04/24 0600) SpO2:  [89 %-100 %] 100 % (04/24 0600) FiO2 (%):  [40 %-100 %] 40 % (04/24 0533) Weight:  [62.5 kg] 62.5 kg (04/24 0500)  PHYSICAL EXAMINATION:  GENERAL:critically ill appearing, +resp distress HEAD: Normocephalic, atraumatic.  EYES: Pupils equal, round, reactive to light.  No scleral icterus.  MOUTH: Moist mucosal membrane. NECK: Supple. No thyromegaly. No nodules. No JVD.  PULMONARY: +rhonchi, +wheezing CARDIOVASCULAR: S1 and S2. Regular rate and rhythm. No murmurs, rubs, or gallops.  GASTROINTESTINAL: Soft, nontender, -distended. No masses. Positive bowel sounds. No hepatosplenomegaly.  MUSCULOSKELETAL: No swelling, clubbing, or edema.  NEUROLOGIC: obtunded SKIN:intact,warm,dry         CULTURE RESULTS   Recent Results (from the past 240 hour(s))  Culture, blood (x  2)     Status: None (Preliminary result)   Collection Time: 10/23/18  6:49 AM  Result Value Ref Range Status   Specimen Description BLOOD RIGHT ARM  Final   Special Requests   Final    BOTTLES DRAWN AEROBIC AND ANAEROBIC Blood Culture results may not be optimal due to an excessive volume of blood received in culture bottles   Culture   Final    NO GROWTH 4 DAYS Performed at [facility], [address], [place], [place] [address]    Report Status PENDING  Incomplete  Culture, blood (x 2)     Status: None (Preliminary result)   Collection Time: 10/23/18  6:49 AM  Result Value Ref Range Status   Specimen Description BLOOD LEFT WRIST  Final   Special Requests BOTTLES DRAWN AEROBIC AND ANAEROBIC [place]  Final   Culture   Final    NO GROWTH 4 DAYS Performed at [facility], [address], [place], [place] [address]    Report Status PENDING  Incomplete  MRSA PCR Screening     Status: None   Collection Time: 10/23/18  7:40 AM  Result Value Ref Range Status   MRSA by PCR NEGATIVE NEGATIVE Final    Comment:        The GeneXpert MRSA Assay (FDA approved for NASAL specimens only), is one component of a comprehensive MRSA colonization surveillance program. It is not intended to diagnose MRSA infection nor to guide or monitor treatment for MRSA infections. Performed at [facility], [address] [place], [place] [address]   SARS Coronavirus 2 [facility] order, Performed in [facility] hospital lab)     Status: None   Collection Time: 10/23/18  6:06 PM  Result Value Ref Range Status   SARS Coronavirus 2 NEGATIVE NEGATIVE Final    Comment: (NOTE) If result is NEGATIVE SARS-CoV-2 target nucleic acids are NOT DETECTED. The SARS-CoV-2 RNA is generally detectable in upper and lower  respiratory specimens during the acute phase of infection. The lowest  concentration of SARS-CoV-2 viral copies this assay can detect is 250  copies / mL. A negative result does  not preclude SARS-CoV-2 infection  and should not be used as the sole basis for treatment or other  patient management decisions.  A negative result may occur with  improper specimen collection / handling, submission of specimen other  than nasopharyngeal swab, presence of viral mutation(s) within the  areas targeted by this assay, and inadequate number of viral copies  (<250 copies / mL). A negative result must be combined with clinical  observations, patient history, and epidemiological information. If result is POSITIVE SARS-CoV-2 target nucleic acids are DETECTED. The SARS-CoV-2 RNA is generally detectable in upper and lower  respiratory specimens dur ing the acute phase of infection.  Positive  results are indicative of active infection with SARS-CoV-2.  Clinical  correlation with patient history and other diagnostic information is  necessary to determine patient infection status.  Positive results do  not rule out bacterial infection or co-infection with other viruses. If result is PRESUMPTIVE POSTIVE SARS-CoV-2 nucleic acids MAY BE PRESENT.   A presumptive positive result was obtained on the submitted specimen  and confirmed on repeat testing.  While 2019 novel coronavirus  (SARS-CoV-2) nucleic acids may be present in the submitted sample  additional confirmatory testing may be necessary for epidemiological  and / or clinical management purposes  to differentiate between  SARS-CoV-2 and other Sarbecovirus currently known to infect humans.  If clinically indicated additional testing with an alternate test  methodology [phone]) is advised. The SARS-CoV-2 RNA is generally  detectable in upper and lower respiratory sp ecimens during the acute  phase of infection. The expected result is Negative. Fact Sheet for Patients:  StrictlyIdeas.no Fact Sheet for Healthcare Providers: BankingDealers.co.za This test is not yet approved or  cleared by the [place] FDA and has been authorized for detection and/or diagnosis of SARS-CoV-2 by FDA under an Emergency Use Authorization (EUA).  This EUA will remain in effect (meaning this test can be used) for the duration of the COVID-19 declaration under Section 564(b)(1) of the Act, 21 U.S.C. section 360bbb-3(b)(1), unless the authorization is terminated or revoked sooner. Performed at [facility], [address] [place], [place] [address]   Respiratory Panel by PCR     Status: None   Collection Time: 10/24/18  9:17 AM  Result Value Ref Range Status   Adenovirus NOT DETECTED NOT DETECTED Final   Coronavirus 229E NOT DETECTED NOT DETECTED Final    Comment: (NOTE) The Coronavirus on the Respiratory Panel, DOES NOT test for the novel  Coronavirus (2019 nCoV)    Coronavirus HKU1 NOT DETECTED NOT DETECTED Final   Coronavirus NL63 NOT DETECTED NOT DETECTED Final   Coronavirus OC43 NOT DETECTED NOT DETECTED Final   Metapneumovirus NOT DETECTED NOT DETECTED Final   Rhinovirus / Enterovirus NOT DETECTED NOT DETECTED Final   Influenza A NOT DETECTED NOT DETECTED Final   Influenza B NOT DETECTED NOT DETECTED Final   Parainfluenza Virus 1 NOT DETECTED NOT DETECTED Final   Parainfluenza Virus 2 NOT DETECTED  NOT DETECTED Final   Parainfluenza Virus 3 NOT DETECTED NOT DETECTED Final   Parainfluenza Virus 4 NOT DETECTED NOT DETECTED Final   Respiratory Syncytial Virus NOT DETECTED NOT DETECTED Final   Bordetella pertussis NOT DETECTED NOT DETECTED Final   Chlamydophila pneumoniae NOT DETECTED NOT DETECTED Final   Mycoplasma pneumoniae NOT DETECTED NOT DETECTED Final    Comment: Performed at [facility], [place] [address], [place], [place] [address]          IMAGING    Dg Abd 1 View  Result Date: 10/26/2018 CLINICAL DATA:  Orogastric tube placement. EXAM: ABDOMEN - 1 VIEW COMPARISON:  None. FINDINGS: The bowel gas pattern is normal. Distal tip of orogastric  tube is seen in expected position of distal stomach. No radio-opaque calculi or other significant radiographic abnormality are seen. IMPRESSION: Distal tip of orogastric tube is seen in expected position of distal stomach. No evidence of bowel obstruction or ileus. Electronically Signed   By: Marijo Conception M.D.   On: 10/26/2018 13:32   Dg Chest Port 1 View  Result Date: 10/26/2018 CLINICAL DATA:  Endotracheal tube placement. EXAM: PORTABLE CHEST 1 VIEW COMPARISON:  Radiograph of same day. FINDINGS: The heart size and mediastinal contours are within normal limits. Endotracheal tube is directed into right mainstem bronchus; withdrawal by 3-4 cm is recommended. Nasogastric tube is seen entering stomach. Bilateral patchy airspace opacities are noted in both lungs most consistent with multifocal pneumonia. The visualized skeletal structures are unremarkable. IMPRESSION: Endotracheal tube is directed into right mainstem bronchus; withdrawal by 3-4 cm is recommended. Nasogastric tube is seen entering stomach. Stable bilateral lung opacities are noted most consistent with multifocal pneumonia. These results will be called to the ordering clinician or representative by the Radiologist Assistant, and communication documented in the PACS or zVision Dashboard. Electronically Signed   By: Marijo Conception M.D.   On: 10/26/2018 13:31   4/20    4/21   4/22        I have Independently reviewed images of  CXR   on 10/27/2018 Interpretation:progressive bilateral infiltrates  ECHO 06/2018 EF 09% Grade 1 diastolic dysfunction   ECHO 4/22  EF 60%  Vent Mode: PRVC FiO2 (%):  [40 %-100 %] 40 % Set Rate:  [16 bmp-24 bmp] 24 bmp Vt Set:  [450 mL] 450 mL PEEP:  [10 cmH20] 10 cmH20   A/P    Severe ACUTE Hypoxic and Hypercapnic Respiratory Failure from acute Pulm edem from fluid overload with progressive renal failure  -continue Mechanical Ventilator support -continue Bronchodilator Therapy -Wean  Fio2 and PEEP as tolerated -VAP/VENT bundle implementation -will perform SAT/SBT when respiratory parameters are met  TACO Lasix as tolerated Vent support  ACUTE KIDNEY INJURY/Renal Failure -follow chem 7 -follow UO -continue Foley Catheter-assess need Patient may need HD Nephrology following  ACUTE ANEMIA- TRANSFUSE AS NEEDED DVT PRX with TED/SCD's ONLY  ELECTROLYTES -follow labs as needed -replace as needed -pharmacy consultation and following  ACIDOSIS Started on biCARB infusions    GI/Nutrition GI PROPHYLAXIS as indicated DIET-->TF's as tolerated Constipation protocol as indicated    DVT/GI PRX ordered TRANSFUSIONS AS NEEDED MONITOR FSBS ASSESS the need for LABS as needed    NEUROLOGY - intubated and sedated - minimal sedation to achieve a RASS goal: -1    Critical Care Time devoted to patient care services described in this note is 35 minutes.   Overall, patient is critically ill, prognosis is guarded.  Patient with Multiorgan failure  and at high risk for cardiac arrest and death.    Corrin Parker, M.D.  [facility]  Medical Director [place] Director [facility] Cardio-Pulmonary Department

## 2018-10-27 NOTE — Procedures (Signed)
Endotracheal Intubation: Patient required placement of an artificial airway secondary to Respiratory Failure  Consent: Emergent.   Hand washing performed prior to starting the procedure.   Medications administered for sedation prior to procedure:  Midazolam 4 mg IV,  Vecuronium 10 mg IV, Fentanyl 100 mcg IV.    A time out procedure was called and correct patient, name, & ID confirmed. Needed supplies and equipment were assembled and checked to include ETT, 10 ml syringe, Glidescope, Mac and Miller blades, suction, oxygen and bag mask valve, end tidal CO2 monitor.   Patient was positioned to align the mouth and pharynx to facilitate visualization of the glottis.   Heart rate, SpO2 and blood pressure was continuously monitored during the procedure. Pre-oxygenation was conducted prior to intubation and endotracheal tube was placed through the vocal cords into the trachea.     The artificial airway was placed under direct visualization via glidescope route using a 8.0 ETT on the first attempt.  ETT was secured at 23 cm mark.  Placement was confirmed by auscuitation of lungs with good breath sounds bilaterally and no stomach sounds.  Condensation was noted on endotracheal tube.   Pulse ox 98%.  CO2 detector in place with appropriate color change.   Complications: None .   Operator: Tonetta Napoles.   Chest radiograph ordered and pending.   Comments: OGT placed via glidescope.  Shawn [name], M.D.  [facility]  Medical Director ICU-[facility] Medical Director [facility] Cardio-Pulmonary Department       

## 2018-10-27 NOTE — Progress Notes (Signed)
Inpatient Diabetes Program Recommendations  AACE/ADA: New Consensus Statement on Inpatient Glycemic Control (2015)  Target Ranges:  Prepandial:   less than 140 mg/dL      Peak postprandial:   less than 180 mg/dL (1-2 hours)      Critically ill patients:  140 - 180 mg/dL   Lab Results  Component Value Date   GLUCAP 190 (H) 10/27/2018   HGBA1C 13.7 (H) 09/20/2018    Review of Glycemic Control Results for Shawn [name], Shawn [name] (MRN [id number]) as of 10/27/2018 09:01  Ref. Range 10/26/2018 11:36 10/26/2018 16:08 10/26/2018 19:30 10/26/2018 23:12 10/27/2018 03:45 10/27/2018 07:52  Glucose-Capillary Latest Ref Range: 70 - 99 mg/dL 189 (H) 233 (H) 257 (H) 278 (H) 290 (H) 190 (H)  Diabetes history:DM1 (makes NO insulin;is sensitive to insulin;requires basal, correction, and meal coverage insulin) Outpatient Diabetes medications:Lantus40units BID,Novolog 4 units TID with meals Current orders for Inpatient glycemic control:Lantus12units BID, Novolog 0-9units Q4H Inpatient Diabetes Program Recommendations:  Consider increasing Lantus to 15 units bid.   Thanks,  Adah Perl, RN, BC-ADM Inpatient Diabetes Coordinator Pager [phone] (8a-5p)

## 2018-10-27 NOTE — Progress Notes (Signed)
[facility] Daily Progress Note   Subjective: 9 Days Post-Op: Right below-the-knee amputation  Patient experienced worsening respiratory distress yesterday and subsequently was intubated.  Objective: Vitals:   10/27/18 0800 10/27/18 0900 10/27/18 0930 10/27/18 1000  BP: 109/65 115/64 112/63 112/65  Pulse: (!) 103 (!) 111 (!) 110 (!) 109  Resp: (!) 24 (!) 24 (!) 24 (!) 24  Temp:   99.1 F (37.3 C)   TempSrc:   Oral   SpO2: 100% 100% 100% 100%  Weight:      Height:        Intake/Output Summary (Last 24 hours) at 10/27/2018 1042 Last data filed at 10/27/2018 0619 Gross per 24 hour  Intake 1038.57 ml  Output 845 ml  Net 193.57 ml   Physical Exam: Sedated and intubated.  No acute distress. CV: RRR Pulmonary: Diminished bilaterally Abdomen:   NG tube in place, sumping bilious fluid.  Soft, Non-tender, Non-distended, (+) Bowel Sounds Vascular:  Right lower extremity: thigh soft. Stump healthy and warm. Incision clean, dry and intact.   Laboratory: CBC    Component Value Date/Time   WBC 10.5 10/27/2018 0451   HGB 5.6 (L) 10/27/2018 0451   HGB 16.4 10/06/2014 0539   HCT 18.7 (L) 10/27/2018 0451   HCT 47.4 10/06/2014 0539   PLT 358 10/27/2018 0451   PLT 250 10/06/2014 0539   BMET    Component Value Date/Time   NA 137 10/27/2018 0451   NA 128 (L) 08/14/2018 1055   NA 132 (L) 10/06/2014 0539   K 3.1 (L) 10/27/2018 0451   K 3.4 (L) 10/06/2014 0539   CL 104 10/27/2018 0451   CL 91 (L) 10/06/2014 0539   CO2 17 (L) 10/27/2018 0451   CO2 26 10/06/2014 0539   GLUCOSE 294 (H) 10/27/2018 0451   GLUCOSE 634 (HH) 10/06/2014 0539   BUN 79 (H) 10/27/2018 0451   BUN 25 (H) 08/14/2018 1055   BUN 16 10/06/2014 0539   CREATININE 4.28 (H) 10/27/2018 0451   CREATININE 1.04 10/06/2014 0539   CALCIUM 7.9 (L) 10/27/2018 0451   CALCIUM 10.0 10/06/2014 0539   GFRNONAA 17 (L) 10/27/2018 0451   GFRNONAA >60 10/06/2014 0539   GFRAA 20 (L) 10/27/2018 0451   GFRAA >60 10/06/2014 0539   Assessment/Planning: The patient is a 33 year old male with multiple medical issues including peripheral artery disease status post a right below the knee amputation approximately 1 week ago transferred to the ICU fordecline inhis respiratory status/hypoxemia, now intubated 1) Patient now intubated due to increasing respiratory distress/failure yesterday.  Care as per ICU team. 2) Stump is healthy.  No indication for vascular intervention at this time  Discussed with Dr. Eber Hong [facility] ([facility]) PA-C 10/27/2018 10:42 AM

## 2018-10-27 NOTE — Progress Notes (Signed)
CRITICAL VALUE ALERT  Critical Value:  hgb Date & Time Notied:  10/27/18 0600  Provider Notified:NP Dewaine Conger Orders Received/Actions taken: d/c heparin   No s/s of bleeding noted.

## 2018-10-27 NOTE — Progress Notes (Signed)
Took over care of patient around 1600.

## 2018-10-27 NOTE — Progress Notes (Signed)
Patient ID: Shawn [name], male   DOB: January 22, 1986, 33 y.o.   MRN: [id number]  [employer] PROGRESS NOTE  Shawn [name]:[id number] DOB: [birth year] DOA: 10/18/2018 PCP: Valera Castle, MD  HPI/Subjective: Patient required intubation yesterday currently on the ventilator sedated   Objective: Vitals:   10/27/18 0930 10/27/18 1000  BP: 112/63 112/65  Pulse: (!) 110 (!) 109  Resp: (!) 24 (!) 24  Temp: 99.1 F (37.3 C)   SpO2: 100% 100%    Filed Weights   10/23/18 0541 10/26/18 0500 10/27/18 0500  Weight: 65.3 kg 64.8 kg 62.5 kg    ROS: Review of Systems  Unable to perform ROS: Intubated   Exam: Physical Exam  Constitutional: No distress.  HENT:  Nose: No mucosal edema.  Mouth/Throat: No oropharyngeal exudate or posterior oropharyngeal edema.  Eyes: Pupils are equal, round, and reactive to light. Conjunctivae and lids are normal.  Neck: No JVD present. Carotid bruit is not present. No edema present. No thyroid mass and no thyromegaly present.  Cardiovascular: S1 normal and S2 normal. Tachycardia present. Exam reveals no gallop.  No murmur heard. Pulses:      Dorsalis pedis pulses are 2+ on the left side.  Respiratory: No respiratory distress. He has no wheezes. He has no rhonchi. He has no rales.  GI: Soft. Bowel sounds are normal. There is no abdominal tenderness.  Musculoskeletal:     Right shoulder: He exhibits no swelling.  Lymphadenopathy:    He has no cervical adenopathy.  Neurological: No cranial nerve deficit.  Sedated  Skin: Skin is warm. Nails show no clubbing.  Right lower extremity amputation site covered in pressure dressing.  Left foot prior amputation site healed well.      Data Reviewed: Basic Metabolic Panel: Recent Labs  Lab 10/22/18 0531 10/23/18 0528 10/24/18 0429 10/25/18 0320 10/26/18 0534 10/27/18 0451  NA 131* 138 134* 136 134* 137  K 4.6 5.1 4.6 4.0 3.5 3.1*  CL 101 108 108 108 104 104  CO2 21* 22 17* 18* 19*  17*  GLUCOSE 109* 224* 81 80 131* 294*  BUN 66* 73* 86* 74* 72* 79*  CREATININE 2.83* 3.26* 3.44* 3.29* 3.75* 4.28*  CALCIUM 8.1* 8.3* 7.7* 8.0* 7.8* 7.9*  MG 2.2 2.1 2.0  --  1.9 2.0  PHOS  --   --   --   --   --  6.8*   CBC: Recent Labs  Lab 10/23/18 0528 10/24/18 0429 10/24/18 1101 10/25/18 0320 10/26/18 0534 10/27/18 0451  WBC 10.9* 20.3*  --  22.8* 21.2* 10.5  NEUTROABS  --   --   --   --   --  8.9*  HGB 9.8* 7.6* 7.3* 7.6* 6.9* 5.6*  HCT 31.6* 25.1* 23.4* 24.4* 22.0* 18.7*  MCV 88.5 91.6  --  89.4 88.0 89.0  PLT 311 264  --  320 387 358    CBG: Recent Labs  Lab 10/26/18 1608 10/26/18 1930 10/26/18 2312 10/27/18 0345 10/27/18 0752  GLUCAP 233* 257* 278* 290* 190*    Scheduled Meds: . chlorhexidine gluconate (MEDLINE KIT)  15 mL Mouth Rinse BID  . epoetin (EPOGEN/PROCRIT) injection  20,000 Units Subcutaneous Weekly  . insulin aspart  0-15 Units Subcutaneous Q4H  . insulin glargine  14 Units Subcutaneous BID  . ipratropium-albuterol  3 mL Nebulization Q4H  . mouth rinse  15 mL Mouth Rinse 10 times per day  . multivitamin  15 mL Per Tube Daily  . pantoprazole sodium  40 mg Per Tube BID  . polyethylene glycol  17 g Oral Daily  . vitamin B-12  5,000 mcg Oral Daily  . ascorbic acid  250 mg Per Tube BID   Continuous Infusions: . sodium chloride Stopped (10/26/18 2051)  . fentaNYL infusion INTRAVENOUS 200 mcg/hr (10/27/18 1147)  . piperacillin-tazobactam (ZOSYN)  IV 12.5 mL/hr at 10/27/18 0619  .  sodium bicarbonate (isotonic) infusion in sterile water 75 mL/hr at 10/27/18 5146    Assessment/Plan:  1. Acute respiratory failure due to aspiration pneumonia now on the vent continue antibiotics try to wean as possible 2. fever due to aspiration pneumonia continue Zosyn COVID ruled out  3. postoperative anemia.  Hemoglobin dropped transfusion 4. 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  Patient had right leg gangrene which was the reason for  amputation.  Continue oral and IV pain medications  5. uncontrolled type 2 diabetes mellitus labile regimen being adjusted regimen based on her blood sugars 6. Acute chronic kidney disease stage III.  Nephrology following  7. diabetic neuropathy resume once awake gabapentin     Code Status:     Code Status Orders  (From admission, onward)         Start     Ordered   10/18/18 1237  Full code  Continuous     10/18/18 1242        Code Status History    Date Active Date Inactive Code Status Order ID Comments User Context   10/11/2018 1157 10/13/2018 1654 Full Code [id number]  Hillary Bow, MD Inpatient   09/19/2018 2049 09/21/2018 1851 Full Code [id number]  Saundra Shelling, MD Inpatient   08/16/2018 1203 08/18/2018 2142 Full Code [id number]  Hillary Bow, MD ED   07/16/2018 1739 07/18/2018 0104 Full Code [id number]  Dustin Flock, MD Inpatient   07/10/2018 0111 07/11/2018 1955 Full Code [id number]  Salary, Avel Peace, MD Inpatient   06/14/2018 1744 06/20/2018 2123 Full Code [id number]  Loletha Grayer, MD ED   05/28/2018 0437 05/28/2018 1926 Full Code [id number]  Arta Silence, MD ED   04/27/2018 1726 05/02/2018 1713 Full Code [id number]  Henreitta Leber, MD Inpatient   04/05/2018 1724 04/11/2018 2142 Full Code [id number]  Dustin Flock, MD Inpatient   02/06/2018 2022 02/07/2018 1512 Full Code [id number]  Demetrios Loll, MD Inpatient   01/10/2018 2259 01/11/2018 1947 Full Code [id number]  Amelia Jo, MD Inpatient   12/30/2017 0019 01/03/2018 1457 Full Code [id number]  Harrie Foreman, MD Inpatient   12/20/2017 1541 12/27/2017 2147 Full Code [id number]  Sharlotte Alamo, [facility] Inpatient   11/10/2017 0049 11/11/2017 1632 Full Code [id number]  Amelia Jo, MD Inpatient   07/13/2017 2112 07/15/2017 1730 Full Code [id number]  Jules Husbands, MD ED      Disposition Plan: To be determined  Consultants:  Vascular surgery  Procedures:  Right leg below-knee amputation  Time spent: 70mn critical care  time  [employer]

## 2018-10-27 NOTE — Progress Notes (Signed)
Date of Admission:  10/18/2018     Shawn [name] a 33 y.o.malewith ahistory ofIDDM, CKD, B/L Diabetic foot infection , osteomyelitis s/p amputation of toes,h/oMRSA bacteremiawell known to me from previous hospitalizations was admitted on 10/18/18 for rt BKA for gangrene rt foot. Shawn [name] underwent surgery on that day by Dr.Schnier and was having high blood glucose. Shawn [name] was seen by hospitalist service for management of DM. Shawn [name] was continuing to have pain at the surgical site and also phantom pain. On 4/20  Shawn [name] had a temp of 102.8 and started to feel SOB- CXR showed multilobar pneumonia and Shawn [name] was started on vanco and zosyn and transferred to ICU 10/18/18 RT BKA 4/16 1 unit PRBC 4/18 2 units PRBC 4/20: hypoxia  one episode of fever , B/L infiltrates  4/21 IV lasix  4/22 CXR better Discontinued vanco and azithromycin  4/23 intubated as Shawn [name] became hypoxic and bradycardia after Shawn [name] removed [place]  10/27/18: leucocytosis resolved, CXR improved   Subjective: intubated  Medications:  . chlorhexidine gluconate (MEDLINE KIT)  15 mL Mouth Rinse BID  . epoetin (EPOGEN/PROCRIT) injection  20,000 Units Subcutaneous Weekly  . insulin aspart  0-15 Units Subcutaneous Q4H  . insulin glargine  14 Units Subcutaneous BID  . ipratropium-albuterol  3 mL Nebulization Q4H  . mouth rinse  15 mL Mouth Rinse 10 times per day  . multivitamin  15 mL Per Tube Daily  . pantoprazole sodium  40 mg Per Tube BID  . polyethylene glycol  17 g Oral Daily  . vitamin B-12  5,000 mcg Oral Daily  . ascorbic acid  250 mg Per Tube BID    Objective: Vital signs in last 24 hours: Temp:  [98.1 F (36.7 C)-100.2 F (37.9 C)] 99.1 F (37.3 C) (04/24 0930) Pulse Rate:  [99-120] 109 (04/24 1000) Resp:  [0-27] 24 (04/24 1000) BP: (99-162)/(58-100) 112/65 (04/24 1000) SpO2:  [98 %-100 %] 100 % (04/24 1000) FiO2 (%):  [40 %-100 %] 40 % (04/24 1132) Weight:  [62.5 kg] 62.5 kg (04/24 0500)  PHYSICAL EXAM:   General:intubated Lungs: b/l air entry  Heart: tachycardia Abdomen: Soft, non-tender,not distended. Bowel sounds normal. No masses Extremities: rt BKA -surgical site clean Skin: No rashes or lesions. Or bruising Lymph: Cervical, supraclavicular normal. Neurologic: Grossly non-focal  Lab Results Recent Labs    10/26/18 0534 10/27/18 0451  WBC 21.2* 10.5  HGB 6.9* 5.6*  HCT 22.0* 18.7*  NA 134* 137  K 3.5 3.1*  CL 104 104  CO2 19* 17*  BUN 72* 79*  CREATININE 3.75* 4.28*   Liver Panel Recent Labs    10/26/18 1154 10/27/18 0451  PROT 6.1* 5.6*  ALBUMIN 1.5* 1.4*  AST 35 18  ALT 25 19  ALKPHOS 229* 164*  BILITOT 0.7 0.3  BILIDIR 0.1  --   IBILI 0.6  --    Sedimentation Rate Recent Labs    10/26/18 1154  ESRSEDRATE 126*   C-Reactive Protein Recent Labs    10/26/18 1154  CRP 42.9*    Microbiology: BC Neg from 4/20 MRSA Nares neg Studies/Results:  10/27/18   Assessment/Plan: 33 y.o.malewith ahistory ofIDDM, CKD, B/L Diabetic foot infection , osteomyelitis s/p amputation of toes,was admitted fro Rt BKA and had it on 4/15 Hypoxic  ? Acute hypoxia with b/l worsening infiltrate/leucocytosis/one episode of fever Unclear etiology Fluid overload/TACO ? Infection? DAH? ARDS??  Leucocytosis and fever resolved- no obvious infectious source   As Shawn [name] is intubated recommend bronch , to send BAL  for culture and also to look at bronchi currently on zosyn - discussed with Dr.Kasa- Shawn [name] will decide on discontinuation   BKA site healthy   worsening cr and anemia- LDH slightly elevated- check to see whether Shawn [name] has hemolysis  AKI on CKD- worsening cr  DM-poorly controlled on insulin  Discussed with Dr.KAsa ID will follow peripherally this weekend Call if needed

## 2018-10-28 ENCOUNTER — Inpatient Hospital Stay: Payer: Medicaid Other

## 2018-10-28 LAB — BASIC METABOLIC PANEL
Anion gap: 14 (ref 5–15)
BUN: 91 mg/dL — ABNORMAL HIGH (ref 6–20)
CO2: 25 mmol/L (ref 22–32)
Calcium: 7.5 mg/dL — ABNORMAL LOW (ref 8.9–10.3)
Chloride: 100 mmol/L (ref 98–111)
Creatinine, Ser: 4.84 mg/dL — ABNORMAL HIGH (ref 0.61–1.24)
GFR calc Af Amer: 17 mL/min — ABNORMAL LOW (ref >60)
GFR calc non Af Amer: 15 mL/min — ABNORMAL LOW (ref >60)
Glucose, Bld: 206 mg/dL — ABNORMAL HIGH (ref 70–99)
Potassium: 3.8 mmol/L (ref 3.5–5.1)
Sodium: 139 mmol/L (ref 135–145)

## 2018-10-28 LAB — CBC
HCT: 23.7 % — ABNORMAL LOW (ref 39.0–52.0)
Hemoglobin: 7.8 g/dL — ABNORMAL LOW (ref 13.0–17.0)
MCH: 28.1 pg (ref 26.0–34.0)
MCHC: 32.9 g/dL (ref 30.0–36.0)
MCV: 85.3 fL (ref 80.0–100.0)
Platelets: 377 10*3/uL (ref 150–400)
RBC: 2.78 MIL/uL — ABNORMAL LOW (ref 4.22–5.81)
RDW: 14.9 % (ref 11.5–15.5)
WBC: 13.2 10*3/uL — ABNORMAL HIGH (ref 4.0–10.5)
nRBC: 0 % (ref 0.0–0.2)

## 2018-10-28 LAB — CBC WITH DIFFERENTIAL/PLATELET
Abs Immature Granulocytes: 0.17 10*3/uL — ABNORMAL HIGH (ref 0.00–0.07)
Basophils Absolute: 0 10*3/uL (ref 0.0–0.1)
Basophils Relative: 0 %
Eosinophils Absolute: 0.4 10*3/uL (ref 0.0–0.5)
Eosinophils Relative: 3 %
HCT: 24.5 % — ABNORMAL LOW (ref 39.0–52.0)
Hemoglobin: 8.2 g/dL — ABNORMAL LOW (ref 13.0–17.0)
Immature Granulocytes: 1 %
Lymphocytes Relative: 4 %
Lymphs Abs: 0.6 10*3/uL — ABNORMAL LOW (ref 0.7–4.0)
MCH: 28.2 pg (ref 26.0–34.0)
MCHC: 33.5 g/dL (ref 30.0–36.0)
MCV: 84.2 fL (ref 80.0–100.0)
Monocytes Absolute: 0.8 10*3/uL (ref 0.1–1.0)
Monocytes Relative: 6 %
Neutro Abs: 12.4 10*3/uL — ABNORMAL HIGH (ref 1.7–7.7)
Neutrophils Relative %: 86 %
Platelets: 432 10*3/uL — ABNORMAL HIGH (ref 150–400)
RBC: 2.91 MIL/uL — ABNORMAL LOW (ref 4.22–5.81)
RDW: 14.9 % (ref 11.5–15.5)
WBC: 14.5 10*3/uL — ABNORMAL HIGH (ref 4.0–10.5)
nRBC: 0 % (ref 0.0–0.2)

## 2018-10-28 LAB — TYPE AND SCREEN
ABO/RH(D): A NEG
Antibody Screen: NEGATIVE
Unit division: 0
Unit division: 0

## 2018-10-28 LAB — BPAM RBC
Blood Product Expiration Date: 202004272359
Blood Product Expiration Date: 202004272359
ISSUE DATE / TIME: 202004240927
ISSUE DATE / TIME: 202004241750
Unit Type and Rh: 600
Unit Type and Rh: 600

## 2018-10-28 LAB — CULTURE, BLOOD (ROUTINE X 2)
Culture: NO GROWTH
Culture: NO GROWTH

## 2018-10-28 LAB — COMPREHENSIVE METABOLIC PANEL
ALT: 17 U/L (ref 0–44)
AST: 17 U/L (ref 15–41)
Albumin: 1.4 g/dL — ABNORMAL LOW (ref 3.5–5.0)
Alkaline Phosphatase: 215 U/L — ABNORMAL HIGH (ref 38–126)
Anion gap: 14 (ref 5–15)
BUN: 83 mg/dL — ABNORMAL HIGH (ref 6–20)
CO2: 23 mmol/L (ref 22–32)
Calcium: 7.9 mg/dL — ABNORMAL LOW (ref 8.9–10.3)
Chloride: 103 mmol/L (ref 98–111)
Creatinine, Ser: 4.67 mg/dL — ABNORMAL HIGH (ref 0.61–1.24)
GFR calc Af Amer: 18 mL/min — ABNORMAL LOW (ref >60)
GFR calc non Af Amer: 15 mL/min — ABNORMAL LOW (ref >60)
Glucose, Bld: 163 mg/dL — ABNORMAL HIGH (ref 70–99)
Potassium: 3.4 mmol/L — ABNORMAL LOW (ref 3.5–5.1)
Sodium: 140 mmol/L (ref 135–145)
Total Bilirubin: 0.6 mg/dL (ref 0.3–1.2)
Total Protein: 5.9 g/dL — ABNORMAL LOW (ref 6.5–8.1)

## 2018-10-28 LAB — BLOOD GAS, ARTERIAL
Acid-Base Excess: 0.3 mmol/L (ref 0.0–2.0)
Bicarbonate: 25.4 mmol/L (ref 20.0–28.0)
FIO2: 0.4
MECHVT: 450 mL
O2 Saturation: 91.5 %
PEEP: 5 cmH2O
Patient temperature: 37
RATE: 24 resp/min
pCO2 arterial: 42 mmHg (ref 32.0–48.0)
pH, Arterial: 7.39 (ref 7.350–7.450)
pO2, Arterial: 63 mmHg — ABNORMAL LOW (ref 83.0–108.0)

## 2018-10-28 LAB — HAPTOGLOBIN: Haptoglobin: 482 mg/dL — ABNORMAL HIGH (ref 17–317)

## 2018-10-28 LAB — GLUCOSE, CAPILLARY
Glucose-Capillary: 146 mg/dL — ABNORMAL HIGH (ref 70–99)
Glucose-Capillary: 170 mg/dL — ABNORMAL HIGH (ref 70–99)
Glucose-Capillary: 192 mg/dL — ABNORMAL HIGH (ref 70–99)
Glucose-Capillary: 198 mg/dL — ABNORMAL HIGH (ref 70–99)
Glucose-Capillary: 207 mg/dL — ABNORMAL HIGH (ref 70–99)
Glucose-Capillary: 210 mg/dL — ABNORMAL HIGH (ref 70–99)

## 2018-10-28 LAB — PROTIME-INR
INR: 1.2 (ref 0.8–1.2)
Prothrombin Time: 15.3 seconds — ABNORMAL HIGH (ref 11.4–15.2)

## 2018-10-28 LAB — TRIGLYCERIDES: Triglycerides: 188 mg/dL — ABNORMAL HIGH (ref <150)

## 2018-10-28 MED ORDER — PROPOFOL 1000 MG/100ML IV EMUL
INTRAVENOUS | Status: AC
  Administered 2018-10-28: 14:00:00 20 ug/kg/min via INTRAVENOUS
  Filled 2018-10-28: qty 100

## 2018-10-28 MED ORDER — POTASSIUM CHLORIDE 20 MEQ PO PACK
40.0000 meq | PACK | Freq: Once | ORAL | Status: AC
  Administered 2018-10-28: 40 meq via NASOGASTRIC
  Filled 2018-10-28: qty 2

## 2018-10-28 MED ORDER — PROPOFOL 1000 MG/100ML IV EMUL
5.0000 ug/kg/min | INTRAVENOUS | Status: DC
  Administered 2018-10-28: 14:00:00 20 ug/kg/min via INTRAVENOUS
  Administered 2018-10-28 – 2018-10-29 (×2): 30 ug/kg/min via INTRAVENOUS
  Administered 2018-10-29 (×2): 40 ug/kg/min via INTRAVENOUS
  Administered 2018-10-30: 20 ug/kg/min via INTRAVENOUS
  Administered 2018-10-30 (×2): 40 ug/kg/min via INTRAVENOUS
  Filled 2018-10-28 (×7): qty 100

## 2018-10-28 NOTE — Progress Notes (Signed)
[facility], [place] 10/28/18  Subjective:   Patient was originally admitted for right leg gangrene and he underwent right below the knee amputation on April 15 by Dr. Delana Meyer.  Postop course complicated by anemia requiring blood transfusion.  Hospital course is further complicated by acute  hypoxia with worsening bilateral infiltrate. Acute rep failure requiring intubation /Vent support 10/26/18   Vent assisted, FiO2 40% TF @ 45 cc/hr Sedated with Fentanyl K is low. 3.4 UOP 750 cc   Objective:  Vital signs in last 24 hours:  Temp:  [98.6 F (37 C)-101.3 F (38.5 C)] 99.5 F (37.5 C) (04/25 0730) Pulse Rate:  [109-138] 123 (04/25 1000) Resp:  [18-24] 24 (04/25 1000) BP: (122-153)/(64-99) 148/81 (04/25 1000) SpO2:  [97 %-100 %] 97 % (04/25 1000) FiO2 (%):  [40 %] 40 % (04/25 0738) Weight:  [64 kg] 64 kg (04/25 0500)  Weight change: 1.5 kg Filed Weights   10/26/18 0500 10/27/18 0500 10/28/18 0500  Weight: 64.8 kg 62.5 kg 64 kg    Intake/Output:    Intake/Output Summary (Last 24 hours) at 10/28/2018 1105 Last data filed at 10/28/2018 0730 Gross per 24 hour  Intake 3515.34 ml  Output 875 ml  Net 2640.34 ml     Physical Exam: General:  Chronically ill-appearing, thin  HEENT  anicteric, ETT in place  Neck  supple  Pulm/lungs  bilateral diffuse crackles, Vent assisted  CVS/Heart  tachycardic, regular  Abdomen:   Soft, nontender  Extremities:  Right BKA, no edema on left  Neurologic:  sedated  Skin:  No acute rashes    Foley in place       Basic Metabolic Panel:  Recent Labs  Lab 10/23/18 0528 10/24/18 0429  10/26/18 0534 10/27/18 0451 10/27/18 1408 10/27/18 2153 10/28/18 0545  NA 138 134*   < > 134* 137 137 139 140  K 5.1 4.6   < > 3.5 3.1* 3.5 3.5 3.4*  CL 108 108   < > 104 104 102 103 103  CO2 22 17*   < > 19* 17* 19* 21* 23  GLUCOSE 224* 81   < > 131* 294* 206* 179* 163*  BUN 73* 86*   < > 72* 79* 82* 82* 83*  CREATININE  3.26* 3.44*   < > 3.75* 4.28* 4.46* 4.66* 4.67*  CALCIUM 8.3* 7.7*   < > 7.8* 7.9* 7.9* 7.8* 7.9*  MG 2.1 2.0  --  1.9 2.0  --  1.9  --   PHOS  --   --   --   --  6.8*  --  6.7*  --    < > = values in this interval not displayed.     CBC: Recent Labs  Lab 10/25/18 0320 10/26/18 0534 10/27/18 0451 10/27/18 1501 10/27/18 2153 10/28/18 0545  WBC 22.8* 21.2* 10.5  --  13.0* 14.5*  NEUTROABS  --   --  8.9*  --   --  12.4*  HGB 7.6* 6.9* 5.6* 6.9* 8.2* 8.2*  HCT 24.4* 22.0* 18.7* 21.6* 24.6* 24.5*  MCV 89.4 88.0 89.0  --  84.8 84.2  PLT 320 387 358  --  393 432*     No results found for: HEPBSAG, HEPBSAB, HEPBIGM    Microbiology:  Recent Results (from the past 240 hour(s))  Culture, blood (x 2)     Status: None   Collection Time: 10/23/18  6:49 AM  Result Value Ref Range Status   Specimen Description BLOOD RIGHT  ARM  Final   Special Requests   Final    BOTTLES DRAWN AEROBIC AND ANAEROBIC Blood Culture results may not be optimal due to an excessive volume of blood received in culture bottles   Culture   Final    NO GROWTH 5 DAYS Performed at [facility], [address] [place], [place] [address]    Report Status 10/28/2018 FINAL  Final  Culture, blood (x 2)     Status: None   Collection Time: 10/23/18  6:49 AM  Result Value Ref Range Status   Specimen Description BLOOD LEFT WRIST  Final   Special Requests BOTTLES DRAWN AEROBIC AND ANAEROBIC [place]  Final   Culture   Final    NO GROWTH 5 DAYS Performed at [facility], [address], [place], [place] [address]    Report Status 10/28/2018 FINAL  Final  MRSA PCR Screening     Status: None   Collection Time: 10/23/18  7:40 AM  Result Value Ref Range Status   MRSA by PCR NEGATIVE NEGATIVE Final    Comment:        The GeneXpert MRSA Assay (FDA approved for NASAL specimens only), is one component of a comprehensive MRSA colonization surveillance program. It is not intended to diagnose  MRSA infection nor to guide or monitor treatment for MRSA infections. Performed at [facility], [address] [place], [place] [address]   SARS Coronavirus 2 [facility] order, Performed in [facility] hospital lab)     Status: None   Collection Time: 10/23/18  6:06 PM  Result Value Ref Range Status   SARS Coronavirus 2 NEGATIVE NEGATIVE Final    Comment: (NOTE) If result is NEGATIVE SARS-CoV-2 target nucleic acids are NOT DETECTED. The SARS-CoV-2 RNA is generally detectable in upper and lower  respiratory specimens during the acute phase of infection. The lowest  concentration of SARS-CoV-2 viral copies this assay can detect is 250  copies / mL. A negative result does not preclude SARS-CoV-2 infection  and should not be used as the sole basis for treatment or other  patient management decisions.  A negative result may occur with  improper specimen collection / handling, submission of specimen other  than nasopharyngeal swab, presence of viral mutation(s) within the  areas targeted by this assay, and inadequate number of viral copies  (<250 copies / mL). A negative result must be combined with clinical  observations, patient history, and epidemiological information. If result is POSITIVE SARS-CoV-2 target nucleic acids are DETECTED. The SARS-CoV-2 RNA is generally detectable in upper and lower  respiratory specimens dur ing the acute phase of infection.  Positive  results are indicative of active infection with SARS-CoV-2.  Clinical  correlation with patient history and other diagnostic information is  necessary to determine patient infection status.  Positive results do  not rule out bacterial infection or co-infection with other viruses. If result is PRESUMPTIVE POSTIVE SARS-CoV-2 nucleic acids MAY BE PRESENT.   A presumptive positive result was obtained on the submitted specimen  and confirmed on repeat testing.  While 2019 novel coronavirus  (SARS-CoV-2) nucleic  acids may be present in the submitted sample  additional confirmatory testing may be necessary for epidemiological  and / or clinical management purposes  to differentiate between  SARS-CoV-2 and other Sarbecovirus currently known to infect humans.  If clinically indicated additional testing with an alternate test  methodology [phone]) is advised. The SARS-CoV-2 RNA is generally  detectable in upper and lower respiratory  sp ecimens during the acute  phase of infection. The expected result is Negative. Fact Sheet for Patients:  StrictlyIdeas.no Fact Sheet for Healthcare Providers: BankingDealers.co.za This test is not yet approved or cleared by the [place] FDA and has been authorized for detection and/or diagnosis of SARS-CoV-2 by FDA under an Emergency Use Authorization (EUA).  This EUA will remain in effect (meaning this test can be used) for the duration of the COVID-19 declaration under Section 564(b)(1) of the Act, 21 U.S.C. section 360bbb-3(b)(1), unless the authorization is terminated or revoked sooner. Performed at [facility], [address] [place], [place] [address]   Respiratory Panel by PCR     Status: None   Collection Time: 10/24/18  9:17 AM  Result Value Ref Range Status   Adenovirus NOT DETECTED NOT DETECTED Final   Coronavirus 229E NOT DETECTED NOT DETECTED Final    Comment: (NOTE) The Coronavirus on the Respiratory Panel, DOES NOT test for the novel  Coronavirus (2019 nCoV)    Coronavirus HKU1 NOT DETECTED NOT DETECTED Final   Coronavirus NL63 NOT DETECTED NOT DETECTED Final   Coronavirus OC43 NOT DETECTED NOT DETECTED Final   Metapneumovirus NOT DETECTED NOT DETECTED Final   Rhinovirus / Enterovirus NOT DETECTED NOT DETECTED Final   Influenza A NOT DETECTED NOT DETECTED Final   Influenza B NOT DETECTED NOT DETECTED Final   Parainfluenza Virus 1 NOT DETECTED NOT DETECTED Final   Parainfluenza  Virus 2 NOT DETECTED NOT DETECTED Final   Parainfluenza Virus 3 NOT DETECTED NOT DETECTED Final   Parainfluenza Virus 4 NOT DETECTED NOT DETECTED Final   Respiratory Syncytial Virus NOT DETECTED NOT DETECTED Final   Bordetella pertussis NOT DETECTED NOT DETECTED Final   Chlamydophila pneumoniae NOT DETECTED NOT DETECTED Final   Mycoplasma pneumoniae NOT DETECTED NOT DETECTED Final    Comment: Performed at [facility], [place]. [address], [place], [place] [address]    Coagulation Studies: Recent Labs    10/28/18 0545  LABPROT 15.3*  INR 1.2    Urinalysis: No results for input(s): COLORURINE, LABSPEC, PHURINE, GLUCOSEU, HGBUR, BILIRUBINUR, KETONESUR, PROTEINUR, UROBILINOGEN, NITRITE, LEUKOCYTESUR in the last 72 hours.  Invalid input(s): APPERANCEUR    Imaging: Dg Abd 1 View  Result Date: 10/26/2018 CLINICAL DATA:  Orogastric tube placement. EXAM: ABDOMEN - 1 VIEW COMPARISON:  None. FINDINGS: The bowel gas pattern is normal. Distal tip of orogastric tube is seen in expected position of distal stomach. No radio-opaque calculi or other significant radiographic abnormality are seen. IMPRESSION: Distal tip of orogastric tube is seen in expected position of distal stomach. No evidence of bowel obstruction or ileus. Electronically Signed   By: Marijo Conception M.D.   On: 10/26/2018 13:32   Dg Chest Port 1 View  Result Date: 10/28/2018 CLINICAL DATA:  Acute respiratory failure. EXAM: PORTABLE CHEST 1 VIEW COMPARISON:  October 27, 2018 FINDINGS: The ETT terminates in good position. The NG tube terminates below today's film. No pneumothorax. Bilateral pulmonary opacities persist and are stable. Cardiac silhouette appears more prominent since the October 27, 2018 study but stable since October 26, 2018. The difference could be positional. IMPRESSION: 1. Support apparatus as above. 2. Persistent bilateral pulmonary opacities, stable in the interval. 3. The cardiac silhouette is mildly prominent,  unchanged since October 26, 2018. Electronically Signed   By: Dorise Bullion III M.D   On: 10/28/2018 05:47   Dg Chest Port 1 View  Result Date: 10/27/2018 CLINICAL DATA:  33 year old male with  respiratory failure negative for COVID-19 on 10/23/2018. Recent right BKA EXAM: PORTABLE CHEST 1 VIEW COMPARISON:  10/26/2018 and earlier. FINDINGS: Portable AP upright view at 0505 hours. Endotracheal tube tip has been pulled back and now projects just above the clavicles. Enteric tube courses to the abdomen as before, tip not included. Normal cardiac size and mediastinal contours. Coarse bilateral pulmonary interstitial opacity has regressed since 0337 hours yesterday. No pneumothorax or pleural effusion identified. No areas of worsening ventilation. IMPRESSION: 1. Endotracheal tube tip has been pulled back and now projects just above the clavicles. Stable enteric tube. 2. Mildly regressed bilateral pulmonary interstitial opacity since yesterday with no new cardiopulmonary abnormality. Electronically Signed   By: Genevie Ann M.D.   On: 10/27/2018 07:46   Dg Chest Port 1 View  Result Date: 10/26/2018 CLINICAL DATA:  Endotracheal tube placement. EXAM: PORTABLE CHEST 1 VIEW COMPARISON:  Radiograph of same day. FINDINGS: The heart size and mediastinal contours are within normal limits. Endotracheal tube is directed into right mainstem bronchus; withdrawal by 3-4 cm is recommended. Nasogastric tube is seen entering stomach. Bilateral patchy airspace opacities are noted in both lungs most consistent with multifocal pneumonia. The visualized skeletal structures are unremarkable. IMPRESSION: Endotracheal tube is directed into right mainstem bronchus; withdrawal by 3-4 cm is recommended. Nasogastric tube is seen entering stomach. Stable bilateral lung opacities are noted most consistent with multifocal pneumonia. These results will be called to the ordering clinician or representative by the Radiologist Assistant, and  communication documented in the PACS or zVision Dashboard. Electronically Signed   By: Marijo Conception M.D.   On: 10/26/2018 13:31     Medications:   . sodium chloride Stopped (10/26/18 2051)  . feeding supplement (VITAL 1.5 CAL) 1,000 mL (10/27/18 1533)  . fentaNYL infusion INTRAVENOUS 300 mcg/hr (10/28/18 0817)  . piperacillin-tazobactam (ZOSYN)  IV 3.375 g (10/28/18 0516)  .  sodium bicarbonate (isotonic) infusion in sterile water 75 mL/hr at 10/28/18 0600   . chlorhexidine gluconate (MEDLINE KIT)  15 mL Mouth Rinse BID  . epoetin (EPOGEN/PROCRIT) injection  20,000 Units Subcutaneous Weekly  . feeding supplement (PRO-STAT SUGAR FREE 64)  30 mL Per Tube Daily  . insulin aspart  0-15 Units Subcutaneous Q4H  . insulin glargine  14 Units Subcutaneous BID  . ipratropium-albuterol  3 mL Nebulization Q4H  . mouth rinse  15 mL Mouth Rinse 10 times per day  . pantoprazole sodium  40 mg Per Tube BID  . polyethylene glycol  17 g Oral Daily  . potassium chloride  40 mEq Per NG tube Once  . vitamin B-12  5,000 mcg Oral Daily  . ascorbic acid  250 mg Per Tube BID   sodium chloride, acetaminophen **OR** acetaminophen, dextrose, hydrALAZINE, ipratropium-albuterol, LORazepam, magnesium hydroxide, metoprolol tartrate  Assessment/ Plan:  33 y.o. 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 2D echo from December 2019 shows moderate concentric hypertrophy, EF 50%, diffuse hypokinesis, grade 1 diastolic dysfunction  1.  Acute kidney injury on chronic kidney disease stage III Baseline creatinine of 1.8/GFR 49 from February 2020 Acute kidney injury appears to be multifactorial  ANA, ANCA are negative; complements normal - UOP about 700 cc last 24 hrs - S Cr  and BUN are worsening - If the trend continues or patient becomes anuric, may need  HD - Electrolytes and Volume status are acceptable No acute indication for Dialysis at present   2.  Acute hypoxic respiratory failure Unclear cause but possibly volume overload versus pneumonia.   Patient is getting IV antibiotics empirically - Vent assisted. Since 4/23  3.  Diabetes type 1 with CKD Diabetes is poorly controlled Hemoglobin A1c 13.7% on September 20, 2018  4.  Anemia Of blood loss and possibly chronic kidney disease -Continue subcu EPO weekly    LOS: [place] 4/25/202011:05 AM  [place], [place]  Note: This note was prepared with Dragon dictation. Any transcription errors are unintentional

## 2018-10-28 NOTE — Progress Notes (Signed)
Patient ID: Shawn [name], male   DOB: October 08, 1985, 33 y.o.   MRN: [id number]  [employer] PROGRESS NOTE  Shawn [name] YJE:[id number] DOB: Aug 28, 1985 DOA: 10/18/2018 PCP: Valera Castle, MD  HPI/Subjective: Currently intubated, sedated.   Objective: Vitals:   10/28/18 0900 10/28/18 1000  BP: (!) 141/76 (!) 148/81  Pulse: (!) 126 (!) 123  Resp: 19 (!) 24  Temp:    SpO2: 98% 97%    Filed Weights   10/26/18 0500 10/27/18 0500 10/28/18 0500  Weight: 64.8 kg 62.5 kg 64 kg    ROS: Review of Systems  Unable to perform ROS: Intubated   Exam: Physical Exam  Constitutional: No distress.  HENT:  Nose: No mucosal edema.  Mouth/Throat: No oropharyngeal exudate or posterior oropharyngeal edema.  Eyes: Pupils are equal, round, and reactive to light. Conjunctivae and lids are normal.  Neck: No JVD present. Carotid bruit is not present. No edema present. No thyroid mass and no thyromegaly present.  Cardiovascular: S1 normal and S2 normal. Tachycardia present. Exam reveals no gallop.  No murmur heard. Pulses:      Dorsalis pedis pulses are 2+ on the left side.  Respiratory: No respiratory distress. He has no wheezes. He has no rhonchi. He has no rales.  GI: Soft. Bowel sounds are normal. There is no abdominal tenderness.  Musculoskeletal:     Right shoulder: He exhibits no swelling.  Lymphadenopathy:    He has no cervical adenopathy.  Neurological: No cranial nerve deficit.  Sedated.  Neuro exam cannot be performed.  Skin: Skin is warm. Nails show no clubbing.  Right lower extremity amputation site covered in pressure dressing.  Left foot prior amputation site healed well.      Data Reviewed: Basic Metabolic Panel: Recent Labs  Lab 10/23/18 0528 10/24/18 0429  10/26/18 0534 10/27/18 0451 10/27/18 1408 10/27/18 2153 10/28/18 0545  NA 138 134*   < > 134* 137 137 139 140  K 5.1 4.6   < > 3.5 3.1* 3.5 3.5 3.4*  CL 108 108   < > 104 104 102 103 103  CO2 22  17*   < > 19* 17* 19* 21* 23  GLUCOSE 224* 81   < > 131* 294* 206* 179* 163*  BUN 73* 86*   < > 72* 79* 82* 82* 83*  CREATININE 3.26* 3.44*   < > 3.75* 4.28* 4.46* 4.66* 4.67*  CALCIUM 8.3* 7.7*   < > 7.8* 7.9* 7.9* 7.8* 7.9*  MG 2.1 2.0  --  1.9 2.0  --  1.9  --   PHOS  --   --   --   --  6.8*  --  6.7*  --    < > = values in this interval not displayed.   CBC: Recent Labs  Lab 10/25/18 0320 10/26/18 0534 10/27/18 0451 10/27/18 1501 10/27/18 2153 10/28/18 0545  WBC 22.8* 21.2* 10.5  --  13.0* 14.5*  NEUTROABS  --   --  8.9*  --   --  12.4*  HGB 7.6* 6.9* 5.6* 6.9* 8.2* 8.2*  HCT 24.4* 22.0* 18.7* 21.6* 24.6* 24.5*  MCV 89.4 88.0 89.0  --  84.8 84.2  PLT 320 387 358  --  393 432*    CBG: Recent Labs  Lab 10/27/18 1638 10/27/18 1933 10/27/18 2338 10/28/18 0309 10/28/18 0714  GLUCAP 177* 160* 198* 210* 146*    Scheduled Meds: . chlorhexidine gluconate (MEDLINE KIT)  15 mL Mouth Rinse BID  .  epoetin (EPOGEN/PROCRIT) injection  20,000 Units Subcutaneous Weekly  . feeding supplement (PRO-STAT SUGAR FREE 64)  30 mL Per Tube Daily  . insulin aspart  0-15 Units Subcutaneous Q4H  . insulin glargine  14 Units Subcutaneous BID  . ipratropium-albuterol  3 mL Nebulization Q4H  . mouth rinse  15 mL Mouth Rinse 10 times per day  . pantoprazole sodium  40 mg Per Tube BID  . polyethylene glycol  17 g Oral Daily  . potassium chloride  40 mEq Per NG tube Once  . vitamin B-12  5,000 mcg Oral Daily  . ascorbic acid  250 mg Per Tube BID   Continuous Infusions: . sodium chloride Stopped (10/26/18 2051)  . feeding supplement (VITAL 1.5 CAL) 1,000 mL (10/27/18 1533)  . fentaNYL infusion INTRAVENOUS 300 mcg/hr (10/28/18 0817)  . piperacillin-tazobactam (ZOSYN)  IV 3.375 g (10/28/18 0516)  .  sodium bicarbonate (isotonic) infusion in sterile water 75 mL/hr at 10/28/18 0600    Assessment/Plan:  1. Severe hypoxic respiratory failure secondary to acute pulmonary edema, aspiration  pneumonia with ARDS.  Continue full vent support with bronchodilators, IV antibiotics, wean as tolerated 2. fever due to aspiration pneumonia continue Zosyn COVID ruled out  3. postoperative anemia.  Transfuse as needed. 4. 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  Patient had right leg gangrene which was the reason for amputation.  Continue oral and IV pain medications  5. uncontrolled type 2 diabetes mellitus labile regimen being adjusted regimen based on her blood sugars 6. Acute chronic kidney disease stage III.  Nephrology following  7. diabetic neuropathy resume once awake gabapentin #8 .  Metabolic acidosis, on bicarb drip, improving, bicarb is 23 today.  Adjust the drip, nephrologist is following.   Code Status:     Code Status Orders  (From admission, onward)         Start     Ordered   10/18/18 1237  Full code  Continuous     10/18/18 1242        Code Status History    Date Active Date Inactive Code Status Order ID Comments User Context   10/11/2018 1157 10/13/2018 1654 Full Code [id number]  Hillary Bow, MD Inpatient   09/19/2018 2049 09/21/2018 1851 Full Code [id number]  Saundra Shelling, MD Inpatient   08/16/2018 1203 08/18/2018 2142 Full Code [id number]  Hillary Bow, MD ED   07/16/2018 1739 07/18/2018 0104 Full Code [id number]  Dustin Flock, MD Inpatient   07/10/2018 0111 07/11/2018 1955 Full Code [id number]  Salary, Avel Peace, MD Inpatient   06/14/2018 1744 06/20/2018 2123 Full Code [id number]  Loletha Grayer, MD ED   05/28/2018 0437 05/28/2018 1926 Full Code [id number]  Arta Silence, MD ED   04/27/2018 1726 05/02/2018 1713 Full Code [id number]  Henreitta Leber, MD Inpatient   04/05/2018 1724 04/11/2018 2142 Full Code [id number]  Dustin Flock, MD Inpatient   02/06/2018 2022 02/07/2018 1512 Full Code [id number]  Demetrios Loll, MD Inpatient   01/10/2018 2259 01/11/2018 1947 Full Code [id number]  Amelia Jo, MD Inpatient   12/30/2017 0019 01/03/2018 1457 Full Code  [id number]  Harrie Foreman, MD Inpatient   12/20/2017 1541 12/27/2017 2147 Full Code [id number]  Sharlotte Alamo, [facility] Inpatient   11/10/2017 0049 11/11/2017 1632 Full Code [id number]  Amelia Jo, MD Inpatient   07/13/2017 2112 07/15/2017 1730 Full Code [id number]  Jules Husbands, MD ED      Disposition Plan: To be determined  Consultants:  Vascular surgery  Procedures:  Right leg below-knee amputation Prognosis poor, high risk for cardiac arrest Time spent: 66mn critical care time  [employer]

## 2018-10-28 NOTE — Progress Notes (Signed)
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 Treated with PCA for pain, control FSBS 4/16 1 unit PRBC transfused 4/18 2 units PRBC's transfused 4/20 progressive hypoxia with acute b/l infiltrates then transferred to [place] for further care and management 4/22 remains hypoxic  4/23 remains hypoxic DX TACO 4/23 severe hypoxia near cardiac arrest, intubated on VENT 4/24 remains on vent, fio2 downt o 50%   REVIEW OF SYSTEMS  PATIENT IS UNABLE TO PROVIDE COMPLETE REVIEW OF SYSTEM S DUE TO SEVERE CRITICAL ILLNESS AND ENCEPHALOPATHY     VITAL SIGNS: Temp:  [98.6 F (37 C)-101.3 F (38.5 C)] 99.2 F (37.3 C) (04/25 0400) Pulse Rate:  [109-138] 123 (04/25 0700) Resp:  [18-24] 24 (04/25 0700) BP: (112-153)/(63-99) 133/73 (04/25 0700) SpO2:  [98 %-100 %] 99 % (04/25 0738) FiO2 (%):  [40 %] 40 % (04/25 0738) Weight:  [64 kg] 64 kg (04/25 0500)  PHYSICAL EXAMINATION:  GENERAL:critically ill appearing, +resp distress HEAD: Normocephalic, atraumatic.  EYES: Pupils equal, round, reactive to light.  No scleral icterus.  MOUTH: Moist mucosal membrane. NECK: Supple. No thyromegaly. No nodules. No JVD.  PULMONARY: +rhonchi, +wheezing CARDIOVASCULAR: S1 and S2. Regular rate and rhythm. No murmurs, rubs, or gallops.  GASTROINTESTINAL: Soft, nontender, -distended. No masses. Positive bowel sounds. No  hepatosplenomegaly.  MUSCULOSKELETAL: No swelling, clubbing, or edema.  NEUROLOGIC: obtunded SKIN:intact,warm,dry    CBC    Component Value Date/Time   WBC 14.5 (H) 10/28/2018 0545   RBC 2.91 (L) 10/28/2018 0545   HGB 8.2 (L) 10/28/2018 0545   HGB 16.4 10/06/2014 0539   HCT 24.5 (L) 10/28/2018 0545   HCT 47.4 10/06/2014 0539   PLT 432 (H) 10/28/2018 0545   PLT 250 10/06/2014 0539   MCV 84.2 10/28/2018 0545   MCV 84 10/06/2014 0539   MCH 28.2 10/28/2018 0545   MCHC 33.5 10/28/2018 0545   RDW 14.9 10/28/2018 0545   RDW 12.7 10/06/2014 0539   LYMPHSABS 0.6 (L) 10/28/2018 0545   LYMPHSABS 2.0 10/06/2014 0539   MONOABS 0.8 10/28/2018 0545   MONOABS 0.8 10/06/2014 0539   EOSABS 0.4 10/28/2018 0545   EOSABS 0.0 10/06/2014 0539   BASOSABS 0.0 10/28/2018 0545   BASOSABS 0.0 10/06/2014 0539      BMP Latest Ref Rng & Units 10/28/2018 10/27/2018 10/27/2018  Glucose 70 - 99 mg/dL 163(H) 179(H) 206(H)  BUN 6 - 20 mg/dL 83(H) 82(H) 82(H)  Creatinine 0.61 - 1.24 mg/dL 4.67(H) 4.66(H) 4.46(H)  BUN/Creat Ratio 9 - 20 - - -  Sodium 135 - 145 mmol/L 140 139 137  Potassium 3.5 - 5.1 mmol/L 3.4(L) 3.5 3.5  Chloride 98 - 111 mmol/L 103 103 102  CO2 22 - 32 mmol/L 23 21(L) 19(L)  Calcium 8.9 - 10.3 mg/dL 7.9(L) 7.8(L) 7.9(L)       CULTURE RESULTS   Recent Results (from the past 240 hour(s))  Culture, blood (x 2)     Status: None  Collection Time: 10/23/18  6:49 AM  Result Value Ref Range Status   Specimen Description BLOOD RIGHT ARM  Final   Special Requests   Final    BOTTLES DRAWN AEROBIC AND ANAEROBIC Blood Culture results may not be optimal due to an excessive volume of blood received in culture bottles   Culture   Final    NO GROWTH 5 DAYS Performed at [facility], [address] [place], [place] [address]    Report Status 10/28/2018 FINAL  Final  Culture, blood (x 2)     Status: None   Collection Time: 10/23/18  6:49 AM  Result Value Ref Range Status    Specimen Description BLOOD LEFT WRIST  Final   Special Requests BOTTLES DRAWN AEROBIC AND ANAEROBIC [place]  Final   Culture   Final    NO GROWTH 5 DAYS Performed at [facility], [address] [place], [place] [address]    Report Status 10/28/2018 FINAL  Final  MRSA PCR Screening     Status: None   Collection Time: 10/23/18  7:40 AM  Result Value Ref Range Status   MRSA by PCR NEGATIVE NEGATIVE Final    Comment:        The GeneXpert MRSA Assay (FDA approved for NASAL specimens only), is one component of a comprehensive MRSA colonization surveillance program. It is not intended to diagnose MRSA infection nor to guide or monitor treatment for MRSA infections. Performed at [facility], [address] [place], [place] [address]   SARS Coronavirus 2 [facility] order, Performed in [facility] hospital lab)     Status: None   Collection Time: 10/23/18  6:06 PM  Result Value Ref Range Status   SARS Coronavirus 2 NEGATIVE NEGATIVE Final    Comment: (NOTE) If result is NEGATIVE SARS-CoV-2 target nucleic acids are NOT DETECTED. The SARS-CoV-2 RNA is generally detectable in upper and lower  respiratory specimens during the acute phase of infection. The lowest  concentration of SARS-CoV-2 viral copies this assay can detect is 250  copies / mL. A negative result does not preclude SARS-CoV-2 infection  and should not be used as the sole basis for treatment or other  patient management decisions.  A negative result may occur with  improper specimen collection / handling, submission of specimen other  than nasopharyngeal swab, presence of viral mutation(s) within the  areas targeted by this assay, and inadequate number of viral copies  (<250 copies / mL). A negative result must be combined with clinical  observations, patient history, and epidemiological information. If result is POSITIVE SARS-CoV-2 target nucleic acids are DETECTED. The SARS-CoV-2 RNA is generally  detectable in upper and lower  respiratory specimens dur ing the acute phase of infection.  Positive  results are indicative of active infection with SARS-CoV-2.  Clinical  correlation with patient history and other diagnostic information is  necessary to determine patient infection status.  Positive results do  not rule out bacterial infection or co-infection with other viruses. If result is PRESUMPTIVE POSTIVE SARS-CoV-2 nucleic acids MAY BE PRESENT.   A presumptive positive result was obtained on the submitted specimen  and confirmed on repeat testing.  While 2019 novel coronavirus  (SARS-CoV-2) nucleic acids may be present in the submitted sample  additional confirmatory testing may be necessary for epidemiological  and / or clinical management purposes  to differentiate between  SARS-CoV-2 and other Sarbecovirus currently known to infect humans.  If clinically indicated additional testing with an alternate  test  methodology [id number]) is advised. The SARS-CoV-2 RNA is generally  detectable in upper and lower respiratory sp ecimens during the acute  phase of infection. The expected result is Negative. Fact Sheet for Patients:  StrictlyIdeas.no Fact Sheet for Healthcare Providers: BankingDealers.co.za This test is not yet approved or cleared by the [place] FDA and has been authorized for detection and/or diagnosis of SARS-CoV-2 by FDA under an Emergency Use Authorization (EUA).  This EUA will remain in effect (meaning this test can be used) for the duration of the COVID-19 declaration under Section 564(b)(1) of the Act, 21 U.S.C. section 360bbb-3(b)(1), unless the authorization is terminated or revoked sooner. Performed at [facility], [address] [place], [place] [address]   Respiratory Panel by PCR     Status: None   Collection Time: 10/24/18  9:17 AM  Result Value Ref Range Status   Adenovirus NOT DETECTED  NOT DETECTED Final   Coronavirus 229E NOT DETECTED NOT DETECTED Final    Comment: (NOTE) The Coronavirus on the Respiratory Panel, DOES NOT test for the novel  Coronavirus (2019 nCoV)    Coronavirus HKU1 NOT DETECTED NOT DETECTED Final   Coronavirus NL63 NOT DETECTED NOT DETECTED Final   Coronavirus OC43 NOT DETECTED NOT DETECTED Final   Metapneumovirus NOT DETECTED NOT DETECTED Final   Rhinovirus / Enterovirus NOT DETECTED NOT DETECTED Final   Influenza A NOT DETECTED NOT DETECTED Final   Influenza B NOT DETECTED NOT DETECTED Final   Parainfluenza Virus 1 NOT DETECTED NOT DETECTED Final   Parainfluenza Virus 2 NOT DETECTED NOT DETECTED Final   Parainfluenza Virus 3 NOT DETECTED NOT DETECTED Final   Parainfluenza Virus 4 NOT DETECTED NOT DETECTED Final   Respiratory Syncytial Virus NOT DETECTED NOT DETECTED Final   Bordetella pertussis NOT DETECTED NOT DETECTED Final   Chlamydophila pneumoniae NOT DETECTED NOT DETECTED Final   Mycoplasma pneumoniae NOT DETECTED NOT DETECTED Final    Comment: Performed at [facility], [place], [place], [place] [address]     COVID-19 NEGATIVE: Acute COVID-19 infection ruled out by PCR.    IMAGING    Dg Chest Port 1 View  Result Date: 10/28/2018 CLINICAL DATA:  Acute respiratory failure. EXAM: PORTABLE CHEST 1 VIEW COMPARISON:  October 27, 2018 FINDINGS: The ETT terminates in good position. The NG tube terminates below today's film. No pneumothorax. Bilateral pulmonary opacities persist and are stable. Cardiac silhouette appears more prominent since the October 27, 2018 study but stable since October 26, 2018. The difference could be positional. IMPRESSION: 1. Support apparatus as above. 2. Persistent bilateral pulmonary opacities, stable in the interval. 3. The cardiac silhouette is mildly prominent, unchanged since October 26, 2018. Electronically Signed   By: Dorise Bullion III M.D   On: 10/28/2018 05:47    4/20    4/21   4/22            I have Independently reviewed images of  CXR   on 10/28/2018 Interpretation:progressive bilateral infiltrates  ECHO 06/2018 EF 97% Grade 1 diastolic dysfunction   ECHO 4/22  EF 60%  Vent Mode: PRVC FiO2 (%):  [40 %] 40 % Set Rate:  [24 bmp] 24 bmp Vt Set:  [450 mL] 450 mL PEEP:  [5 cmH20] 5 cmH20 Plateau Pressure:  [16 cmH20] 16 cmH20   A/P   SEVERE ACUTE HYPOXIC RESP FAILURE from ACUTE PULM edema and with progressive fluid overload presumed Dx of TACO AND aspiration pneumonia with ARDS  Severe ACUTE Hypoxic and Hypercapnic Respiratory Failure -continue Mechanical Ventilator support -continue Bronchodilator Therapy -Wean Fio2 and PEEP as tolerated -VAP/VENT bundle implementation -will perform SAT/SBT when respiratory parameters are met   Presumed DX of TACO Lasix as needed Vent support  ACUTE KIDNEY INJURY/Renal Failure -follow chem 7 -follow UO -continue Foley Catheter-assess need Follow up nephrology recs   ACUTE ANEMIA- TRANSFUSE AS NEEDED DVT PRX with TED/SCD's ONLY  ACIDOSIS Follow up ABG   GI/Nutrition GI PROPHYLAXIS as indicated DIET-->TF's as tolerated Constipation protocol as indicated   DVT/GI PRX ordered TRANSFUSIONS AS NEEDED MONITOR FSBS ASSESS the need for LABS as needed  ELECTROLYTES -follow labs as needed -replace as needed -pharmacy consultation and following   NEUROLOGY - intubated and sedated - minimal sedation to achieve a RASS goal: -1     COVID-19 NEGATIVE: Acute COVID-19 infection ruled out by PCR.    Critical Care Time devoted to patient care services described in this note is 41 minutes.   Overall, patient is critically ill, prognosis is guarded.    Corrin Parker, M.D.  [facility]  Medical Director [place] Director [facility] Cardio-Pulmonary Department

## 2018-10-28 NOTE — Progress Notes (Signed)
Patient failed SAT/SBT o2 sats declined to 80% on vent HR in 130's delirious and not following commands

## 2018-10-28 NOTE — Progress Notes (Signed)
10 Days Post-Op   Subjective/Chief Complaint: Patient is responsive, restrained and intubated.    Objective: Vital signs in last 24 hours: Temp:  [98.6 F (37 C)-101.3 F (38.5 C)] 99.5 F (37.5 C) (04/25 0730) Pulse Rate:  [109-138] 123 (04/25 1000) Resp:  [18-24] 24 (04/25 1000) BP: (122-153)/(64-99) 148/81 (04/25 1000) SpO2:  [97 %-100 %] 97 % (04/25 1000) FiO2 (%):  [40 %] 40 % (04/25 0738) Weight:  [64 kg] 64 kg (04/25 0500) Last BM Date: 10/28/18  Intake/Output from previous day: 04/24 0701 - 04/25 0700 In: 3515.3 [I.V.:2307.7; Blood:374; NG/GT:681.3; IV Piggyback:152.4] Out: 750 [Urine:750] Intake/Output this shift: Total I/O In: -  Out: 125 [Urine:125] Patient is restrained and intubated.  He is responsive to commands.  Incision/Wound: Right stump intact.  No erythema.  Wound not covered.   Lab Results:  Recent Labs    10/27/18 2153 10/28/18 0545  WBC 13.0* 14.5*  HGB 8.2* 8.2*  HCT 24.6* 24.5*  PLT 393 432*   BMET Recent Labs    10/27/18 2153 10/28/18 0545  NA 139 140  K 3.5 3.4*  CL 103 103  CO2 21* 23  GLUCOSE 179* 163*  BUN 82* 83*  CREATININE 4.66* 4.67*  CALCIUM 7.8* 7.9*   PT/INR Recent Labs    10/28/18 0545  LABPROT 15.3*  INR 1.2   ABG Recent Labs    10/27/18 0458 10/28/18 0946  PHART 7.26* 7.39  HCO3 18.4* 25.4    Studies/Results: Dg Abd 1 View  Result Date: 10/26/2018 CLINICAL DATA:  Orogastric tube placement. EXAM: ABDOMEN - 1 VIEW COMPARISON:  None. FINDINGS: The bowel gas pattern is normal. Distal tip of orogastric tube is seen in expected position of distal stomach. No radio-opaque calculi or other significant radiographic abnormality are seen. IMPRESSION: Distal tip of orogastric tube is seen in expected position of distal stomach. No evidence of bowel obstruction or ileus. Electronically Signed   By: Marijo Conception M.D.   On: 10/26/2018 13:32   Dg Chest Port 1 View  Result Date: 10/28/2018 CLINICAL DATA:  Acute  respiratory failure. EXAM: PORTABLE CHEST 1 VIEW COMPARISON:  October 27, 2018 FINDINGS: The ETT terminates in good position. The NG tube terminates below today's film. No pneumothorax. Bilateral pulmonary opacities persist and are stable. Cardiac silhouette appears more prominent since the October 27, 2018 study but stable since October 26, 2018. The difference could be positional. IMPRESSION: 1. Support apparatus as above. 2. Persistent bilateral pulmonary opacities, stable in the interval. 3. The cardiac silhouette is mildly prominent, unchanged since October 26, 2018. Electronically Signed   By: Dorise Bullion III M.D   On: 10/28/2018 05:47   Dg Chest Port 1 View  Result Date: 10/27/2018 CLINICAL DATA:  32 year old male with respiratory failure negative for COVID-19 on 10/23/2018. Recent right BKA EXAM: PORTABLE CHEST 1 VIEW COMPARISON:  10/26/2018 and earlier. FINDINGS: Portable AP upright view at 0505 hours. Endotracheal tube tip has been pulled back and now projects just above the clavicles. Enteric tube courses to the abdomen as before, tip not included. Normal cardiac size and mediastinal contours. Coarse bilateral pulmonary interstitial opacity has regressed since 0337 hours yesterday. No pneumothorax or pleural effusion identified. No areas of worsening ventilation. IMPRESSION: 1. Endotracheal tube tip has been pulled back and now projects just above the clavicles. Stable enteric tube. 2. Mildly regressed bilateral pulmonary interstitial opacity since yesterday with no new cardiopulmonary abnormality. Electronically Signed   By: Genevie Ann M.D.   On:  10/27/2018 07:46   Dg Chest Port 1 View  Result Date: 10/26/2018 CLINICAL DATA:  Endotracheal tube placement. EXAM: PORTABLE CHEST 1 VIEW COMPARISON:  Radiograph of same day. FINDINGS: The heart size and mediastinal contours are within normal limits. Endotracheal tube is directed into right mainstem bronchus; withdrawal by 3-4 cm is recommended. Nasogastric  tube is seen entering stomach. Bilateral patchy airspace opacities are noted in both lungs most consistent with multifocal pneumonia. The visualized skeletal structures are unremarkable. IMPRESSION: Endotracheal tube is directed into right mainstem bronchus; withdrawal by 3-4 cm is recommended. Nasogastric tube is seen entering stomach. Stable bilateral lung opacities are noted most consistent with multifocal pneumonia. These results will be called to the ordering clinician or representative by the Radiologist Assistant, and communication documented in the PACS or zVision Dashboard. Electronically Signed   By: Marijo Conception M.D.   On: 10/26/2018 13:31    Anti-infectives: Anti-infectives (From admission, onward)   Start     Dose/Rate Route Frequency Ordered Stop   10/24/18 2200  piperacillin-tazobactam (ZOSYN) IVPB 3.375 g     3.375 g 12.5 mL/hr over 240 Minutes Intravenous Every 8 hours 10/24/18 1517     10/24/18 1800  azithromycin (ZITHROMAX) 500 mg in sodium chloride 0.9 % 250 mL IVPB  Status:  Discontinued     500 mg 250 mL/hr over 60 Minutes Intravenous Every 24 hours 10/24/18 0725 10/25/18 1208   10/24/18 1300  piperacillin-tazobactam (ZOSYN) IVPB 3.375 g     3.375 g 12.5 mL/hr over 240 Minutes Intravenous  Once 10/24/18 1142 10/24/18 1616   10/24/18 0645  vancomycin (VANCOCIN) 1,500 mg in sodium chloride 0.9 % 500 mL IVPB  Status:  Discontinued     1,500 mg 250 mL/hr over 120 Minutes Intravenous  Once 10/24/18 0639 10/24/18 1619   10/24/18 0645  ceFEPIme (MAXIPIME) 2 g in sodium chloride 0.9 % 100 mL IVPB  Status:  Discontinued     2 g 200 mL/hr over 30 Minutes Intravenous Every 12 hours 10/24/18 0639 10/24/18 0803   10/23/18 2230  azithromycin (ZITHROMAX) 500 mg in sodium chloride 0.9 % 250 mL IVPB  Status:  Discontinued     500 mg 250 mL/hr over 60 Minutes Intravenous Every 24 hours 10/23/18 2220 10/24/18 0725   10/23/18 0700  vancomycin variable dose per unstable renal function  (pharmacist dosing)  Status:  Discontinued      Does not apply See admin instructions 10/23/18 0700 10/23/18 1237   10/23/18 0630  piperacillin-tazobactam (ZOSYN) IVPB 3.375 g  Status:  Discontinued     3.375 g 12.5 mL/hr over 240 Minutes Intravenous Every 8 hours 10/23/18 0628 10/24/18 0622   10/23/18 0615  vancomycin (VANCOCIN) 1,500 mg in sodium chloride 0.9 % 500 mL IVPB     1,500 mg 250 mL/hr over 120 Minutes Intravenous  Once 10/23/18 0614 10/23/18 0902   10/23/18 0615  ceFEPIme (MAXIPIME) 2 g in sodium chloride 0.9 % 100 mL IVPB  Status:  Discontinued     2 g 200 mL/hr over 30 Minutes Intravenous  Once 10/23/18 0614 10/23/18 0623   10/18/18 1830  clindamycin (CLEOCIN) IVPB 300 mg     300 mg 100 mL/hr over 30 Minutes Intravenous Every 8 hours 10/18/18 1242 10/19/18 1831   10/18/18 0930  clindamycin (CLEOCIN) 300 MG/50ML IVPB    Note to Pharmacy:  Lyman Bishop   : cabinet override      10/18/18 0930 10/18/18 1050   10/18/18 0600  clindamycin (CLEOCIN) IVPB  300 mg     300 mg 100 mL/hr over 30 Minutes Intravenous On call to O.R. 10/17/18 2139 10/18/18 1120      Assessment/Plan: s/p Procedure(s): AMPUTATION BELOW KNEE (Right) Continue with same care.  LOS: 10 days    Elmore Guise 10/28/2018

## 2018-10-29 DIAGNOSIS — E872 Acidosis: Secondary | ICD-10-CM

## 2018-10-29 LAB — CBC
HCT: 24.1 % — ABNORMAL LOW (ref 39.0–52.0)
Hemoglobin: 7.7 g/dL — ABNORMAL LOW (ref 13.0–17.0)
MCH: 27.9 pg (ref 26.0–34.0)
MCHC: 32 g/dL (ref 30.0–36.0)
MCV: 87.3 fL (ref 80.0–100.0)
Platelets: 386 10*3/uL (ref 150–400)
RBC: 2.76 MIL/uL — ABNORMAL LOW (ref 4.22–5.81)
RDW: 15.1 % (ref 11.5–15.5)
WBC: 12.8 10*3/uL — ABNORMAL HIGH (ref 4.0–10.5)
nRBC: 0 % (ref 0.0–0.2)

## 2018-10-29 LAB — GLUCOSE, CAPILLARY
Glucose-Capillary: 118 mg/dL — ABNORMAL HIGH (ref 70–99)
Glucose-Capillary: 123 mg/dL — ABNORMAL HIGH (ref 70–99)
Glucose-Capillary: 127 mg/dL — ABNORMAL HIGH (ref 70–99)
Glucose-Capillary: 132 mg/dL — ABNORMAL HIGH (ref 70–99)
Glucose-Capillary: 139 mg/dL — ABNORMAL HIGH (ref 70–99)
Glucose-Capillary: 165 mg/dL — ABNORMAL HIGH (ref 70–99)

## 2018-10-29 LAB — COMPREHENSIVE METABOLIC PANEL
ALT: 18 U/L (ref 0–44)
AST: 24 U/L (ref 15–41)
Albumin: 1.3 g/dL — ABNORMAL LOW (ref 3.5–5.0)
Alkaline Phosphatase: 218 U/L — ABNORMAL HIGH (ref 38–126)
Anion gap: 13 (ref 5–15)
BUN: 89 mg/dL — ABNORMAL HIGH (ref 6–20)
CO2: 26 mmol/L (ref 22–32)
Calcium: 7.5 mg/dL — ABNORMAL LOW (ref 8.9–10.3)
Chloride: 102 mmol/L (ref 98–111)
Creatinine, Ser: 4.88 mg/dL — ABNORMAL HIGH (ref 0.61–1.24)
GFR calc Af Amer: 17 mL/min — ABNORMAL LOW (ref >60)
GFR calc non Af Amer: 15 mL/min — ABNORMAL LOW (ref >60)
Glucose, Bld: 199 mg/dL — ABNORMAL HIGH (ref 70–99)
Potassium: 3.8 mmol/L (ref 3.5–5.1)
Sodium: 141 mmol/L (ref 135–145)
Total Bilirubin: 0.5 mg/dL (ref 0.3–1.2)
Total Protein: 5.5 g/dL — ABNORMAL LOW (ref 6.5–8.1)

## 2018-10-29 MED ORDER — PIPERACILLIN-TAZOBACTAM 3.375 G IVPB
3.3750 g | Freq: Two times a day (BID) | INTRAVENOUS | Status: DC
  Administered 2018-10-29 – 2018-10-30 (×3): 3.375 g via INTRAVENOUS
  Filled 2018-10-29 (×3): qty 50

## 2018-10-29 MED ORDER — POLYETHYLENE GLYCOL 3350 17 G PO PACK: 17.0000 g | PACK | Freq: Every day | ORAL | Status: DC | PRN

## 2018-10-29 MED ORDER — HYDROMORPHONE BOLUS VIA INFUSION
0.5000 mg | INTRAVENOUS | Status: DC | PRN
  Administered 2018-10-30: 08:00:00 0.5 mg via INTRAVENOUS
  Filled 2018-10-29: qty 1

## 2018-10-29 MED ORDER — SODIUM CHLORIDE 0.9 % IV SOLN
0.5000 mg/h | INTRAVENOUS | Status: DC
  Administered 2018-10-29: 10:00:00 0.5 mg/h via INTRAVENOUS
  Filled 2018-10-29 (×2): qty 5

## 2018-10-29 MED ORDER — HYDROMORPHONE HCL 1 MG/ML IJ SOLN
1.0000 mg | Freq: Once | INTRAMUSCULAR | Status: AC
  Administered 2018-10-29: 10:00:00 1 mg via INTRAVENOUS
  Filled 2018-10-29: qty 1

## 2018-10-29 NOTE — Progress Notes (Signed)
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 Delana Meyer.  Postop course complicated by anemia requiring blood transfusion.  Hospital course is further complicated by acute  hypoxia with worsening bilateral infiltrate. Acute rep failure requiring intubation /Vent support 10/26/18   Vent assisted, FiO2 40->60% TF @ 45 cc/hr Sedated   K has improved to 3.8 04/25 0701 - 04/26 0700 In: 3690.8 [I.V.:2493.2; NG/GT:1119; IV Piggyback:78.6] Out: 4287 [Urine:1130; Emesis/NG output:105]    Objective:  Vital signs in last 24 hours:  Temp:  [98 F (36.7 C)-101.1 F (38.4 C)] 99.9 F (37.7 C) (04/26 0800) Pulse Rate:  [103-131] 116 (04/26 0800) Resp:  [14-29] 21 (04/26 0800) BP: (128-163)/(70-117) 146/83 (04/26 0800) SpO2:  [90 %-100 %] 95 % (04/26 0800) FiO2 (%):  [40 %-100 %] 60 % (04/26 1000)  Weight change:  Filed Weights   10/26/18 0500 10/27/18 0500 10/28/18 0500  Weight: 64.8 kg 62.5 kg 64 kg    Intake/Output:    Intake/Output Summary (Last 24 hours) at 10/29/2018 1112 Last data filed at 10/29/2018 0600 Gross per 24 hour  Intake 3690.75 ml  Output 1110 ml  Net 2580.75 ml     Physical Exam: General:  Chronically ill-appearing, thin  HEENT  anicteric, ETT in place  Neck  supple  Pulm/lungs  bilaterlal coarse breath sounds, Vent assisted  CVS/Heart  tachycardic, regular  Abdomen:   Soft, nontender  Extremities:  Right BKA, no edema on left  Neurologic:  sedated  Skin:  No acute rashes    Foley in place       Basic Metabolic Panel:  Recent Labs  Lab 10/23/18 0528 10/24/18 0429  10/26/18 0534 10/27/18 0451 10/27/18 1408 10/27/18 2153 10/28/18 0545 10/28/18 2153 10/29/18 0443  NA 138 134*   < > 134* 137 137 139 140 139 141  K 5.1 4.6   < > 3.5 3.1* 3.5 3.5 3.4* 3.8 3.8  CL 108 108   < > 104 104 102 103 103 100  102  CO2 22 17*   < > 19* 17* 19* 21* _0 GLUCOSE 224* 81   < > 131* 294* 206* 179* 163* 206* 199*  BUN 73* 86*   < > 72* 79* 82* 82* 83* 91* 89*  CREATININE 3.26* 3.44*   < > 3.75* 4.28* 4.46* 4.66* 4.67* 4.84* 4.88*  CALCIUM 8.3* 7.7*   < > 7.8* 7.9* 7.9* 7.8* 7.9* 7.5* 7.5*  MG 2.1 2.0  --  1.9 2.0  --  1.9  --   --   --   PHOS  --   --   --   --  6.8*  --  6.7*  --   --   --    < > = values in this interval not displayed.     CBC: Recent Labs  Lab 10/27/18 0451 10/27/18 1501 10/27/18 2153 10/28/18 0545 10/28/18 2153 10/29/18 0443  WBC 10.5  --  13.0* 14.5* 13.2* 12.8*  NEUTROABS 8.9*  --   --  12.4*  --   --   HGB 5.6* 6.9* 8.2* 8.2* 7.8* 7.7*  HCT 18.7* 21.6* 24.6* 24.5* 23.7* 24.1*  MCV 89.0  --  84.8 84.2 85.3 87.3  PLT 358  --  393 432* 377 386     No results found for: HEPBSAG, HEPBSAB, HEPBIGM  Microbiology:  Recent Results (from the past 240 hour(s))  Culture, blood (x 2)     Status: None   Collection Time: 10/23/18  6:49 AM  Result Value Ref Range Status   Specimen Description BLOOD RIGHT ARM  Final   Special Requests   Final    BOTTLES DRAWN AEROBIC AND ANAEROBIC Blood Culture results may not be optimal due to an excessive volume of blood received in culture bottles   Culture   Final    NO GROWTH 5 DAYS Performed at [facility], [address] [place], [place] [address]    Report Status 10/28/2018 FINAL  Final  Culture, blood (x 2)     Status: None   Collection Time: 10/23/18  6:49 AM  Result Value Ref Range Status   Specimen Description BLOOD LEFT WRIST  Final   Special Requests BOTTLES DRAWN AEROBIC AND ANAEROBIC [place]  Final   Culture   Final    NO GROWTH 5 DAYS Performed at [facility], [address], [place], [place] [address]    Report Status 10/28/2018 FINAL  Final  MRSA PCR Screening     Status: None   Collection Time: 10/23/18  7:40 AM  Result Value Ref Range Status   MRSA by PCR NEGATIVE NEGATIVE Final     Comment:        The GeneXpert MRSA Assay (FDA approved for NASAL specimens only), is one component of a comprehensive MRSA colonization surveillance program. It is not intended to diagnose MRSA infection nor to guide or monitor treatment for MRSA infections. Performed at [facility], [address] [place], [place] [address]   SARS Coronavirus 2 [facility] order, Performed in [facility] hospital lab)     Status: None   Collection Time: 10/23/18  6:06 PM  Result Value Ref Range Status   SARS Coronavirus 2 NEGATIVE NEGATIVE Final    Comment: (NOTE) If result is NEGATIVE SARS-CoV-2 target nucleic acids are NOT DETECTED. The SARS-CoV-2 RNA is generally detectable in upper and lower  respiratory specimens during the acute phase of infection. The lowest  concentration of SARS-CoV-2 viral copies this assay can detect is 250  copies / mL. A negative result does not preclude SARS-CoV-2 infection  and should not be used as the sole basis for treatment or other  patient management decisions.  A negative result may occur with  improper specimen collection / handling, submission of specimen other  than nasopharyngeal swab, presence of viral mutation(s) within the  areas targeted by this assay, and inadequate number of viral copies  (<250 copies / mL). A negative result must be combined with clinical  observations, patient history, and epidemiological information. If result is POSITIVE SARS-CoV-2 target nucleic acids are DETECTED. The SARS-CoV-2 RNA is generally detectable in upper and lower  respiratory specimens dur ing the acute phase of infection.  Positive  results are indicative of active infection with SARS-CoV-2.  Clinical  correlation with patient history and other diagnostic information is  necessary to determine patient infection status.  Positive results do  not rule out bacterial infection or co-infection with other viruses. If result is PRESUMPTIVE  POSTIVE SARS-CoV-2 nucleic acids MAY BE PRESENT.   A presumptive positive result was obtained on the submitted specimen  and confirmed on repeat testing.  While 2019 novel coronavirus  (SARS-CoV-2) nucleic acids may be present in the submitted sample  additional confirmatory testing may be necessary for epidemiological  and / or clinical management purposes  to differentiate between  SARS-CoV-2 and other Sarbecovirus currently known to infect humans.  If clinically indicated additional testing with an alternate test  methodology [phone]) is advised. The SARS-CoV-2 RNA is generally  detectable in upper and lower respiratory sp ecimens during the acute  phase of infection. The expected result is Negative. Fact Sheet for Patients:  StrictlyIdeas.no Fact Sheet for Healthcare Providers: BankingDealers.co.za This test is not yet approved or cleared by the [place] FDA and has been authorized for detection and/or diagnosis of SARS-CoV-2 by FDA under an Emergency Use Authorization (EUA).  This EUA will remain in effect (meaning this test can be used) for the duration of the COVID-19 declaration under Section 564(b)(1) of the Act, 21 U.S.C. section 360bbb-3(b)(1), unless the authorization is terminated or revoked sooner. Performed at [facility], [address] [place], [place] [address]   Respiratory Panel by PCR     Status: None   Collection Time: 10/24/18  9:17 AM  Result Value Ref Range Status   Adenovirus NOT DETECTED NOT DETECTED Final   Coronavirus 229E NOT DETECTED NOT DETECTED Final    Comment: (NOTE) The Coronavirus on the Respiratory Panel, DOES NOT test for the novel  Coronavirus (2019 nCoV)    Coronavirus HKU1 NOT DETECTED NOT DETECTED Final   Coronavirus NL63 NOT DETECTED NOT DETECTED Final   Coronavirus OC43 NOT DETECTED NOT DETECTED Final   Metapneumovirus NOT DETECTED NOT DETECTED Final   Rhinovirus /  Enterovirus NOT DETECTED NOT DETECTED Final   Influenza A NOT DETECTED NOT DETECTED Final   Influenza B NOT DETECTED NOT DETECTED Final   Parainfluenza Virus 1 NOT DETECTED NOT DETECTED Final   Parainfluenza Virus 2 NOT DETECTED NOT DETECTED Final   Parainfluenza Virus 3 NOT DETECTED NOT DETECTED Final   Parainfluenza Virus 4 NOT DETECTED NOT DETECTED Final   Respiratory Syncytial Virus NOT DETECTED NOT DETECTED Final   Bordetella pertussis NOT DETECTED NOT DETECTED Final   Chlamydophila pneumoniae NOT DETECTED NOT DETECTED Final   Mycoplasma pneumoniae NOT DETECTED NOT DETECTED Final    Comment: Performed at [facility], [place], [place], [place] [address]    Coagulation Studies: Recent Labs    10/28/18 0545  LABPROT 15.3*  INR 1.2    Urinalysis: No results for input(s): COLORURINE, LABSPEC, PHURINE, GLUCOSEU, HGBUR, BILIRUBINUR, KETONESUR, PROTEINUR, UROBILINOGEN, NITRITE, LEUKOCYTESUR in the last 72 hours.  Invalid input(s): APPERANCEUR    Imaging: Dg Chest Port 1 View  Result Date: 10/28/2018 CLINICAL DATA:  Acute respiratory failure. EXAM: PORTABLE CHEST 1 VIEW COMPARISON:  October 27, 2018 FINDINGS: The ETT terminates in good position. The NG tube terminates below today's film. No pneumothorax. Bilateral pulmonary opacities persist and are stable. Cardiac silhouette appears more prominent since the October 27, 2018 study but stable since October 26, 2018. The difference could be positional. IMPRESSION: 1. Support apparatus as above. 2. Persistent bilateral pulmonary opacities, stable in the interval. 3. The cardiac silhouette is mildly prominent, unchanged since October 26, 2018. Electronically Signed   By: Dorise Bullion III M.D   On: 10/28/2018 05:47     Medications:   . sodium chloride 5 mL/hr at 10/29/18 0700  . feeding supplement (VITAL 1.5 CAL) 45 mL/hr at 10/29/18 0700  . HYDROmorphone 1 mg/hr (10/29/18 1005)  . piperacillin-tazobactam (ZOSYN)  IV 3.375 g  (10/29/18 0948)  . propofol (DIPRIVAN) infusion 40 mcg/kg/min (10/29/18 1014)   . chlorhexidine gluconate (MEDLINE KIT)  15 mL Mouth Rinse BID  .  epoetin (EPOGEN/PROCRIT) injection  20,000 Units Subcutaneous Weekly  . feeding supplement (PRO-STAT SUGAR FREE 64)  30 mL Per Tube Daily  . insulin aspart  0-15 Units Subcutaneous Q4H  . insulin glargine  14 Units Subcutaneous BID  . ipratropium-albuterol  3 mL Nebulization Q4H  . mouth rinse  15 mL Mouth Rinse 10 times per day  . pantoprazole sodium  40 mg Per Tube BID  . vitamin B-12  5,000 mcg Oral Daily  . ascorbic acid  250 mg Per Tube BID   sodium chloride, acetaminophen **OR** acetaminophen, dextrose, hydrALAZINE, HYDROmorphone, ipratropium-albuterol, LORazepam, magnesium hydroxide, polyethylene glycol  Assessment/ Plan:  33 y.o. 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 2D echo from December 2019 shows moderate concentric hypertrophy, EF 50%, diffuse hypokinesis, grade 1 diastolic dysfunction  1.  Acute kidney injury on chronic kidney disease stage III Baseline creatinine of 1.8/GFR 49 from February 2020 Acute kidney injury appears to be multifactorial  ANA, ANCA are negative; complements normal - UOP about 1130 cc last 24 hrs - S Cr and BUN are critically high - for now, Electrolytes and Volume status are acceptable No acute indication for Dialysis at present  2.  Acute hypoxic respiratory failure Unclear cause but possibly volume overload versus pneumonia.   Patient is getting IV antibiotics empirically - Vent assisted. Since 4/23  3.  Diabetes type 1 with CKD Diabetes is poorly controlled Hemoglobin A1c 13.7% on September 20, 2018  4.  Anemia Of blood loss and possibly chronic  kidney disease -Continue subcu EPO weekly    LOS: [place] 4/26/202011:12 AM  [facility] [place], [place]  Note: This note was prepared with Dragon dictation. Any transcription errors are unintentional

## 2018-10-29 NOTE — Progress Notes (Signed)
PHARMACY NOTE:  ANTIMICROBIAL RENAL DOSAGE ADJUSTMENT  Current antimicrobial regimen includes a mismatch between antimicrobial dosage and estimated renal function.  As per policy approved by the Pharmacy & Therapeutics and Medical Executive Committees, the antimicrobial dosage will be adjusted accordingly.  Current antimicrobial dosage:  Zosyn 3.375g IV q8h  Indication: Pneumonia  Renal Function:  Estimated Creatinine Clearance: 20 mL/min (A) (by C-G formula based on SCr of 4.84 mg/dL (H)). []      On intermittent HD, scheduled: []      On CRRT    Antimicrobial dosage has been changed to:  Zosyn 3.375g IV q12h per CrCl </= 20 ml/min  Thank you for allowing pharmacy to be a part of this patient's care.  Tobie Lords, PharmD, BCPS Clinical Pharmacist 10/29/2018

## 2018-10-29 NOTE — Progress Notes (Signed)
Patient ID: Shawn [name], male   DOB: 19-Jun-1986, 33 y.o.   MRN: [id number]  [employer] PROGRESS NOTE  Shawn [name] [id number] DOB: 1985-08-02 DOA: 10/18/2018 PCP: Valera Castle, MD  HPI/Subjective:  Failed extubation today developed hypoxia, increased work of breathing.   Objective: Vitals:   10/29/18 1200 10/29/18 1300  BP: 140/76 140/86  Pulse: (!) 117 (!) 120  Resp: 16 (!) 24  Temp: 99.5 F (37.5 C)   SpO2: 95% 92%    Filed Weights   10/26/18 0500 10/27/18 0500 10/28/18 0500  Weight: 64.8 kg 62.5 kg 64 kg    ROS: Review of Systems  Unable to perform ROS: Intubated   Exam: Physical Exam  Constitutional: No distress.  HENT:  Nose: No mucosal edema.  Mouth/Throat: No oropharyngeal exudate or posterior oropharyngeal edema.  Eyes: Pupils are equal, round, and reactive to light. Conjunctivae and lids are normal.  Neck: No JVD present. Carotid bruit is not present. No edema present. No thyroid mass and no thyromegaly present.  Cardiovascular: S1 normal and S2 normal. Tachycardia present. Exam reveals no gallop.  No murmur heard. Pulses:      Dorsalis pedis pulses are 2+ on the left side.  Respiratory: No respiratory distress. He has no wheezes. He has no rhonchi. He has no rales.  GI: Soft. Bowel sounds are normal. There is no abdominal tenderness.  Musculoskeletal:     Right shoulder: He exhibits no swelling.  Lymphadenopathy:    He has no cervical adenopathy.  Neurological: No cranial nerve deficit.  Sedated.  Neuro exam cannot be performed.  Skin: Skin is warm. Nails show no clubbing.  Right lower extremity amputation site covered in pressure dressing.  Left foot prior amputation site healed well.      Data Reviewed: Basic Metabolic Panel: Recent Labs  Lab 10/23/18 0528 10/24/18 0429  10/26/18 0534 10/27/18 0451 10/27/18 1408 10/27/18 2153 10/28/18 0545 10/28/18 2153 10/29/18 0443  NA 138 134*   < > 134* 137 137 139 140 139 141   K 5.1 4.6   < > 3.5 3.1* 3.5 3.5 3.4* 3.8 3.8  CL 108 108   < > 104 104 102 103 103 100 102  CO2 22 17*   < > 19* 17* 19* 21* '23 25 26  '$ GLUCOSE 224* 81   < > 131* 294* 206* 179* 163* 206* 199*  BUN 73* 86*   < > 72* 79* 82* 82* 83* 91* 89*  CREATININE 3.26* 3.44*   < > 3.75* 4.28* 4.46* 4.66* 4.67* 4.84* 4.88*  CALCIUM 8.3* 7.7*   < > 7.8* 7.9* 7.9* 7.8* 7.9* 7.5* 7.5*  MG 2.1 2.0  --  1.9 2.0  --  1.9  --   --   --   PHOS  --   --   --   --  6.8*  --  6.7*  --   --   --    < > = values in this interval not displayed.   CBC: Recent Labs  Lab 10/27/18 0451 10/27/18 1501 10/27/18 2153 10/28/18 0545 10/28/18 2153 10/29/18 0443  WBC 10.5  --  13.0* 14.5* 13.2* 12.8*  NEUTROABS 8.9*  --   --  12.4*  --   --   HGB 5.6* 6.9* 8.2* 8.2* 7.8* 7.7*  HCT 18.7* 21.6* 24.6* 24.5* 23.7* 24.1*  MCV 89.0  --  84.8 84.2 85.3 87.3  PLT 358  --  393 432* 377 386  CBG: Recent Labs  Lab 10/28/18 2010 10/28/18 2339 10/29/18 0411 10/29/18 0727 10/29/18 1144  GLUCAP 207* 170* 165* 127* 132*    Scheduled Meds: . chlorhexidine gluconate (MEDLINE KIT)  15 mL Mouth Rinse BID  . epoetin (EPOGEN/PROCRIT) injection  20,000 Units Subcutaneous Weekly  . feeding supplement (PRO-STAT SUGAR FREE 64)  30 mL Per Tube Daily  . insulin aspart  0-15 Units Subcutaneous Q4H  . insulin glargine  14 Units Subcutaneous BID  . ipratropium-albuterol  3 mL Nebulization Q4H  . mouth rinse  15 mL Mouth Rinse 10 times per day  . pantoprazole sodium  40 mg Per Tube BID  . vitamin B-12  5,000 mcg Oral Daily  . ascorbic acid  250 mg Per Tube BID   Continuous Infusions: . sodium chloride 5 mL/hr at 10/29/18 0700  . feeding supplement (VITAL 1.5 CAL) 45 mL/hr at 10/29/18 0700  . HYDROmorphone 1 mg/hr (10/29/18 1005)  . piperacillin-tazobactam (ZOSYN)  IV 3.375 g (10/29/18 0948)  . propofol (DIPRIVAN) infusion 40 mcg/kg/min (10/29/18 1014)    Assessment/Plan:  1. Severe hypoxic respiratory failure secondary to  acute pulmonary edema, aspiration pneumonia with ARDS.  Continue full vent support with bronchodilators, IV antibiotics, wean as tolerated 2. fever due to aspiration pneumonia continue Zosyn COVID ruled out  3. postoperative anemia.  Transfuse as needed. 4. 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  Patient had right leg gangrene which was the reason for amputation.  Continue oral and IV pain medications  5. uncontrolled type 2 diabetes mellitus labile regimen being adjusted regimen based on her blood sugars 6. Acute chronic kidney disease stage III.  Nephrology following  7. diabetic neuropathy resume once awake gabapentin #8 .  Metabolic acidosis, received bicarb drip, acidosis is corrected.   Code Status:     Code Status Orders  (From admission, onward)         Start     Ordered   10/18/18 1237  Full code  Continuous     10/18/18 1242        Code Status History    Date Active Date Inactive Code Status Order ID Comments User Context   10/11/2018 1157 10/13/2018 1654 Full Code [id number]  Hillary Bow, MD Inpatient   09/19/2018 2049 09/21/2018 1851 Full Code [id number]  Saundra Shelling, MD Inpatient   08/16/2018 1203 08/18/2018 2142 Full Code [id number]  Hillary Bow, MD ED   07/16/2018 1739 07/18/2018 0104 Full Code [id number]  Dustin Flock, MD Inpatient   07/10/2018 0111 07/11/2018 1955 Full Code [id number]  Salary, Avel Peace, MD Inpatient   06/14/2018 1744 06/20/2018 2123 Full Code [id number]  Loletha Grayer, MD ED   05/28/2018 0437 05/28/2018 1926 Full Code [id number]  Arta Silence, MD ED   04/27/2018 1726 05/02/2018 1713 Full Code [id number]  Henreitta Leber, MD Inpatient   04/05/2018 1724 04/11/2018 2142 Full Code [id number]  Dustin Flock, MD Inpatient   02/06/2018 2022 02/07/2018 1512 Full Code [id number]  Demetrios Loll, MD Inpatient   01/10/2018 2259 01/11/2018 1947 Full Code [id number]  Amelia Jo, MD Inpatient   12/30/2017 0019 01/03/2018 1457 Full Code [id number]   Harrie Foreman, MD Inpatient   12/20/2017 1541 12/27/2017 2147 Full Code [id number]  Sharlotte Alamo, [facility] Inpatient   11/10/2017 0049 11/11/2017 1632 Full Code [id number]  Amelia Jo, MD Inpatient   07/13/2017 2112 07/15/2017 1730 Full Code [id number]  Jules Husbands, MD ED      Disposition Plan: To be determined  Consultants:  Vascular surgery  Procedures:  Right leg below-knee amputation Prognosis poor, high risk for cardiac arrest Time spent: 60mn critical care time  [employer]

## 2018-10-29 NOTE — Progress Notes (Signed)
Pt remains intubated. Pt has required an increase in FiO2 throughout the coarse of the day-currently 55%. SpO2 >90%. Anterior/posterior lung sounds are coarse to auscultation. Pt failed SBT this am d/t tachypnea, sats dropped to 79% on 40% FiO2, HR 130s. Pt has remained in ST on cardiac monitor. BP slightly hypertensive- sys 140s, HR 1-teens. Pt has awoke to +FC.  Pt transitioned to a dilaudid gtt from fentanyl for pain management. Pt awakes with c/o of immense phantom pain to RLE. Pt remains on propofol for sedation. Pt awakes with agitation and has pulled at ETT a number of times->RT has readjusted ETT for proper placement at 24 x2. Mother/sister have been updated via phone and wish to E-visit tomorrow evening.

## 2018-10-29 NOTE — Progress Notes (Signed)
11 Days Post-Op   Subjective/Chief Complaint: Patient intubated, responsive. Failed weaning today from vent.     Objective: Vital signs in last 24 hours: Temp:  [98 F (36.7 C)-101.1 F (38.4 C)] 99.9 F (37.7 C) (04/26 0800) Pulse Rate:  [103-131] 116 (04/26 0800) Resp:  [14-29] 21 (04/26 0800) BP: (128-159)/(70-117) 146/83 (04/26 0800) SpO2:  [90 %-100 %] 97 % (04/26 1120) FiO2 (%):  [40 %-100 %] 40 % (04/26 1120) Last BM Date: 10/28/18  Intake/Output from previous day: 04/25 0701 - 04/26 0700 In: 3690.8 [I.V.:2493.2; NG/GT:1119; IV Piggyback:78.6] Out: 5885 [Urine:1130; Emesis/NG output:105] Intake/Output this shift: No intake/output data recorded.  General appearance: alert and cooperative Head: Normocephalic, without obvious abnormality, atraumatic Eyes: conjunctivae/corneas clear. PERRL, EOM's intact. Fundi benign. Neck: no adenopathy, no carotid bruit, no JVD, supple, symmetrical, trachea midline and thyroid not enlarged, symmetric, no tenderness/mass/nodules Extremities: Right BKA stump healing well.   Lab Results:  Recent Labs    10/28/18 2153 10/29/18 0443  WBC 13.2* 12.8*  HGB 7.8* 7.7*  HCT 23.7* 24.1*  PLT 377 386   BMET Recent Labs    10/28/18 2153 10/29/18 0443  NA 139 141  K 3.8 3.8  CL 100 102  CO2 25 26  GLUCOSE 206* 199*  BUN 91* 89*  CREATININE 4.84* 4.88*  CALCIUM 7.5* 7.5*   PT/INR Recent Labs    10/28/18 0545  LABPROT 15.3*  INR 1.2   ABG Recent Labs    10/27/18 0458 10/28/18 0946  PHART 7.26* 7.39  HCO3 18.4* 25.4    Studies/Results: Dg Chest Port 1 View  Result Date: 10/28/2018 CLINICAL DATA:  Acute respiratory failure. EXAM: PORTABLE CHEST 1 VIEW COMPARISON:  October 27, 2018 FINDINGS: The ETT terminates in good position. The NG tube terminates below today's film. No pneumothorax. Bilateral pulmonary opacities persist and are stable. Cardiac silhouette appears more prominent since the October 27, 2018 study but stable  since October 26, 2018. The difference could be positional. IMPRESSION: 1. Support apparatus as above. 2. Persistent bilateral pulmonary opacities, stable in the interval. 3. The cardiac silhouette is mildly prominent, unchanged since October 26, 2018. Electronically Signed   By: Dorise Bullion III M.D   On: 10/28/2018 05:47    Anti-infectives: Anti-infectives (From admission, onward)   Start     Dose/Rate Route Frequency Ordered Stop   10/29/18 1000  piperacillin-tazobactam (ZOSYN) IVPB 3.375 g     3.375 g 12.5 mL/hr over 240 Minutes Intravenous Every 12 hours 10/29/18 0459     10/24/18 2200  piperacillin-tazobactam (ZOSYN) IVPB 3.375 g  Status:  Discontinued     3.375 g 12.5 mL/hr over 240 Minutes Intravenous Every 8 hours 10/24/18 1517 10/29/18 0459   10/24/18 1800  azithromycin (ZITHROMAX) 500 mg in sodium chloride 0.9 % 250 mL IVPB  Status:  Discontinued     500 mg 250 mL/hr over 60 Minutes Intravenous Every 24 hours 10/24/18 0725 10/25/18 1208   10/24/18 1300  piperacillin-tazobactam (ZOSYN) IVPB 3.375 g     3.375 g 12.5 mL/hr over 240 Minutes Intravenous  Once 10/24/18 1142 10/24/18 1616   10/24/18 0645  vancomycin (VANCOCIN) 1,500 mg in sodium chloride 0.9 % 500 mL IVPB  Status:  Discontinued     1,500 mg 250 mL/hr over 120 Minutes Intravenous  Once 10/24/18 0639 10/24/18 1619   10/24/18 0645  ceFEPIme (MAXIPIME) 2 g in sodium chloride 0.9 % 100 mL IVPB  Status:  Discontinued     2 g 200 mL/hr  over 30 Minutes Intravenous Every 12 hours 10/24/18 0639 10/24/18 0803   10/23/18 2230  azithromycin (ZITHROMAX) 500 mg in sodium chloride 0.9 % 250 mL IVPB  Status:  Discontinued     500 mg 250 mL/hr over 60 Minutes Intravenous Every 24 hours 10/23/18 2220 10/24/18 0725   10/23/18 0700  vancomycin variable dose per unstable renal function (pharmacist dosing)  Status:  Discontinued      Does not apply See admin instructions 10/23/18 0700 10/23/18 1237   10/23/18 0630  piperacillin-tazobactam  (ZOSYN) IVPB 3.375 g  Status:  Discontinued     3.375 g 12.5 mL/hr over 240 Minutes Intravenous Every 8 hours 10/23/18 0628 10/24/18 0622   10/23/18 0615  vancomycin (VANCOCIN) 1,500 mg in sodium chloride 0.9 % 500 mL IVPB     1,500 mg 250 mL/hr over 120 Minutes Intravenous  Once 10/23/18 0614 10/23/18 0902   10/23/18 0615  ceFEPIme (MAXIPIME) 2 g in sodium chloride 0.9 % 100 mL IVPB  Status:  Discontinued     2 g 200 mL/hr over 30 Minutes Intravenous  Once 10/23/18 0614 10/23/18 0623   10/18/18 1830  clindamycin (CLEOCIN) IVPB 300 mg     300 mg 100 mL/hr over 30 Minutes Intravenous Every 8 hours 10/18/18 1242 10/19/18 1831   10/18/18 0930  clindamycin (CLEOCIN) 300 MG/50ML IVPB    Note to Pharmacy:  Lyman Bishop   : cabinet override      10/18/18 0930 10/18/18 1050   10/18/18 0600  clindamycin (CLEOCIN) IVPB 300 mg     300 mg 100 mL/hr over 30 Minutes Intravenous On call to O.R. 10/17/18 2139 10/18/18 1120      Assessment/Plan: s/p Procedure(s): AMPUTATION BELOW KNEE (Right) Continue with currrent care.  LOS: 11 days    Elmore Guise 10/29/2018

## 2018-10-29 NOTE — Progress Notes (Signed)
CRITICAL CARE NOTE  SYNOPSIS 33 yo BRITTLE DM admitted for SEVERE ACUTE HYPOXIC RESP FAILURE from ACUTE PULM edema and with progressive fluid overload presumed Dx of TACO AND aspiration pneumonia with ARDS with progressive renal failure   CC  follow up respiratory failure  SUBJECTIVE Patient remains critically ill Prognosis is guarded hgb stable On bicarb infusion Creatinine worsening  CBC    Component Value Date/Time   WBC 12.8 (H) 10/29/2018 0443   RBC 2.76 (L) 10/29/2018 0443   HGB 7.7 (L) 10/29/2018 0443   HGB 16.4 10/06/2014 0539   HCT 24.1 (L) 10/29/2018 0443   HCT 47.4 10/06/2014 0539   PLT 386 10/29/2018 0443   PLT 250 10/06/2014 0539   MCV 87.3 10/29/2018 0443   MCV 84 10/06/2014 0539   MCH 27.9 10/29/2018 0443   MCHC 32.0 10/29/2018 0443   RDW 15.1 10/29/2018 0443   RDW 12.7 10/06/2014 0539   LYMPHSABS 0.6 (L) 10/28/2018 0545   LYMPHSABS 2.0 10/06/2014 0539   MONOABS 0.8 10/28/2018 0545   MONOABS 0.8 10/06/2014 0539   EOSABS 0.4 10/28/2018 0545   EOSABS 0.0 10/06/2014 0539   BASOSABS 0.0 10/28/2018 0545   BASOSABS 0.0 10/06/2014 0539    BMP Latest Ref Rng & Units 10/29/2018 10/28/2018 10/28/2018  Glucose 70 - 99 mg/dL 199(H) 206(H) 163(H)  BUN 6 - 20 mg/dL 89(H) 91(H) 83(H)  Creatinine 0.61 - 1.24 mg/dL 4.88(H) 4.84(H) 4.67(H)  BUN/Creat Ratio 9 - 20 - - -  Sodium 135 - 145 mmol/L 141 139 140  Potassium 3.5 - 5.1 mmol/L 3.8 3.8 3.4(L)  Chloride 98 - 111 mmol/L 102 100 103  CO2 22 - 32 mmol/L '26 25 23  '$ Calcium 8.9 - 10.3 mg/dL 7.5(L) 7.5(L) 7.9(L)       COVID-19 NEGATIVE: Acute COVID-19 infection ruled out by PCR.     SIGNIFICANT EVENTS 4/8 assesses by [facility] surgery-limb threatening ischemia 4/15right leg gangrene s/pright below-the-knee amputation Treated with PCA for pain, control FSBS 4/16 1 unit PRBC transfused 4/18 2 units PRBC's transfused 4/20 progressive hypoxia with acute b/l infiltrates then transferred to [place] for further care and  management 4/22 remains hypoxic  4/23 remains hypoxic DX TACO 4/23 severe hypoxia near cardiac arrest, intubated on VENT 4/24 remains on vent, fio2 downt o 50% 4/25 failed SAT/SBT due to worsening hypoxia and delerium    BP (!) 152/93   Pulse (!) 111   Temp 98 F (36.7 C) (Oral)   Resp 18   Ht '5\' 6"'$  (1.676 m)   Wt 64 kg   SpO2 90%   BMI 22.77 kg/m   Vent Mode: PRVC FiO2 (%):  [40 %] 40 % Set Rate:  [24 bmp] 24 bmp Vt Set:  [450 mL] 450 mL PEEP:  [5 cmH20] 5 cmH20 Plateau Pressure:  [11 cmH20-14 cmH20] 14 cmH20      REVIEW OF SYSTEMS  PATIENT IS UNABLE TO PROVIDE COMPLETE REVIEW OF SYSTEMS DUE TO SEVERE CRITICAL ILLNESS   PHYSICAL EXAMINATION:  GENERAL:critically ill appearing, +resp distress HEAD: Normocephalic, atraumatic.  EYES: Pupils equal, round, reactive to light.  No scleral icterus.  MOUTH: Moist mucosal membrane. NECK: Supple. No thyromegaly. No nodules. No JVD.  PULMONARY: +rhonchi, +wheezing CARDIOVASCULAR: S1 and S2. Regular rate and rhythm. No murmurs, rubs, or gallops.  GASTROINTESTINAL: Soft, nontender, -distended. No masses. Positive bowel sounds. No hepatosplenomegaly.  MUSCULOSKELETAL: No swelling, clubbing, or edema.  NEUROLOGIC: obtunded, GCS<8 SKIN:intact,warm,dry    CULTURE RESULTS   Recent Results (from the  past 240 hour(s))  Culture, blood (x 2)     Status: None   Collection Time: 10/23/18  6:49 AM  Result Value Ref Range Status   Specimen Description BLOOD RIGHT ARM  Final   Special Requests   Final    BOTTLES DRAWN AEROBIC AND ANAEROBIC Blood Culture results may not be optimal due to an excessive volume of blood received in culture bottles   Culture   Final    NO GROWTH 5 DAYS Performed at [facility], [address] [place], [place] [address]    Report Status 10/28/2018 FINAL  Final  Culture, blood (x 2)     Status: None   Collection Time: 10/23/18  6:49 AM  Result Value Ref Range Status   Specimen Description  BLOOD LEFT WRIST  Final   Special Requests BOTTLES DRAWN AEROBIC AND ANAEROBIC [place]  Final   Culture   Final    NO GROWTH 5 DAYS Performed at [facility], [address], [place], [place] [address]    Report Status 10/28/2018 FINAL  Final  MRSA PCR Screening     Status: None   Collection Time: 10/23/18  7:40 AM  Result Value Ref Range Status   MRSA by PCR NEGATIVE NEGATIVE Final    Comment:        The GeneXpert MRSA Assay (FDA approved for NASAL specimens only), is one component of a comprehensive MRSA colonization surveillance program. It is not intended to diagnose MRSA infection nor to guide or monitor treatment for MRSA infections. Performed at [facility], [address] [place], [place] [address]   SARS Coronavirus 2 [facility] order, Performed in [place] hospital lab)     Status: None   Collection Time: 10/23/18  6:06 PM  Result Value Ref Range Status   SARS Coronavirus 2 NEGATIVE NEGATIVE Final    Comment: (NOTE) If result is NEGATIVE SARS-CoV-2 target nucleic acids are NOT DETECTED. The SARS-CoV-2 RNA is generally detectable in upper and lower  respiratory specimens during the acute phase of infection. The lowest  concentration of SARS-CoV-2 viral copies this assay can detect is 250  copies / mL. A negative result does not preclude SARS-CoV-2 infection  and should not be used as the sole basis for treatment or other  patient management decisions.  A negative result may occur with  improper specimen collection / handling, submission of specimen other  than nasopharyngeal swab, presence of viral mutation(s) within the  areas targeted by this assay, and inadequate number of viral copies  (<250 copies / mL). A negative result must be combined with clinical  observations, patient history, and epidemiological information. If result is POSITIVE SARS-CoV-2 target nucleic acids are DETECTED. The SARS-CoV-2 RNA is generally detectable in upper and  lower  respiratory specimens dur ing the acute phase of infection.  Positive  results are indicative of active infection with SARS-CoV-2.  Clinical  correlation with patient history and other diagnostic information is  necessary to determine patient infection status.  Positive results do  not rule out bacterial infection or co-infection with other viruses. If result is PRESUMPTIVE POSTIVE SARS-CoV-2 nucleic acids MAY BE PRESENT.   A presumptive positive result was obtained on the submitted specimen  and confirmed on repeat testing.  While 2019 novel coronavirus  (SARS-CoV-2) nucleic acids may be present in the submitted sample  additional confirmatory testing may be necessary for epidemiological  and / or clinical management purposes  to differentiate between  SARS-CoV-2 and  other Sarbecovirus currently known to infect humans.  If clinically indicated additional testing with an alternate test  methodology [phone]) is advised. The SARS-CoV-2 RNA is generally  detectable in upper and lower respiratory sp ecimens during the acute  phase of infection. The expected result is Negative. Fact Sheet for Patients:  StrictlyIdeas.no Fact Sheet for Healthcare Providers: BankingDealers.co.za This test is not yet approved or cleared by the [place] FDA and has been authorized for detection and/or diagnosis of SARS-CoV-2 by FDA under an Emergency Use Authorization (EUA).  This EUA will remain in effect (meaning this test can be used) for the duration of the COVID-19 declaration under Section 564(b)(1) of the Act, 21 U.S.C. section 360bbb-3(b)(1), unless the authorization is terminated or revoked sooner. Performed at [facility], [address] [place], [place] [address]   Respiratory Panel by PCR     Status: None   Collection Time: 10/24/18  9:17 AM  Result Value Ref Range Status   Adenovirus NOT DETECTED NOT DETECTED Final    Coronavirus 229E NOT DETECTED NOT DETECTED Final    Comment: (NOTE) The Coronavirus on the Respiratory Panel, DOES NOT test for the novel  Coronavirus (2019 nCoV)    Coronavirus HKU1 NOT DETECTED NOT DETECTED Final   Coronavirus NL63 NOT DETECTED NOT DETECTED Final   Coronavirus OC43 NOT DETECTED NOT DETECTED Final   Metapneumovirus NOT DETECTED NOT DETECTED Final   Rhinovirus / Enterovirus NOT DETECTED NOT DETECTED Final   Influenza A NOT DETECTED NOT DETECTED Final   Influenza B NOT DETECTED NOT DETECTED Final   Parainfluenza Virus 1 NOT DETECTED NOT DETECTED Final   Parainfluenza Virus 2 NOT DETECTED NOT DETECTED Final   Parainfluenza Virus 3 NOT DETECTED NOT DETECTED Final   Parainfluenza Virus 4 NOT DETECTED NOT DETECTED Final   Respiratory Syncytial Virus NOT DETECTED NOT DETECTED Final   Bordetella pertussis NOT DETECTED NOT DETECTED Final   Chlamydophila pneumoniae NOT DETECTED NOT DETECTED Final   Mycoplasma pneumoniae NOT DETECTED NOT DETECTED Final    Comment: Performed at [facility], [place]. [address], [place],  [address]        Indwelling Urinary Catheter continued, requirement due to   Reason to continue Indwelling Urinary Catheter for strict Intake/Output monitoring for hemodynamic instability         Ventilator continued, requirement due to, resp failure    Ventilator Sedation RASS 0 to -2     ECHO 06/2018 EF 06% Grade 1 diastolic dysfunction   ECHO 4/22  EF 60%      ASSESSMENT AND PLAN SYNOPSIS   SEVERE ACUTE HYPOXIC RESP FAILURE from ACUTE PULM edema and with progressive fluid overload presumed Dx of TACO AND aspiration pneumonia with ARDS   Severe ACUTE Hypoxic and Hypercapnic Respiratory Failure -continue Full MV support -continue Bronchodilator Therapy -Wean Fio2 and PEEP as tolerated -will perform SAT/SBT when respiratory parameters are met  PRESUMED DX OF TACO Vent support fio2 support lasix  Renal  Failure- -follow chem 7 -follow UO -continue Foley Catheter-assess need daily Follow up nephrology recs Stop bicarb infusion today   NEUROLOGY - intubated and sedated - minimal sedation to achieve a RASS goal: -1 Wake up assessment pending   CARDIAC ICU monitoring  ID -continue IV abx as prescibed -follow up cultures Covering aspiration pneumonia  GI/Nutrition GI PROPHYLAXIS as indicated DIET-->TF's as tolerated Constipation protocol as indicated  ENDO - ICU hypoglycemic\Hyperglycemia protocol -check FSBS per protocol   ELECTROLYTES -follow labs as needed -  replace as needed -pharmacy consultation and following   DVT/GI PRX ordered TRANSFUSIONS AS NEEDED MONITOR FSBS ASSESS the need for LABS as needed   Critical Care Time devoted to patient care services described in this note is 32 minutes.   Overall, patient is critically ill, prognosis is guarded.  Patient with Multiorgan failure and at high risk for cardiac arrest and death.    Corrin Parker, M.D.  [facility]  Medical Director [place] Director [facility] Cardio-Pulmonary Department

## 2018-10-29 NOTE — Plan of Care (Signed)
Pt remained on 40% FiO2 overnight.  He had episodes of agitation.  UOP >584ml.  Tolerating tube feedings.

## 2018-10-30 ENCOUNTER — Inpatient Hospital Stay: Payer: Medicaid Other

## 2018-10-30 LAB — GLUCOSE, CAPILLARY
Glucose-Capillary: 75 mg/dL (ref 70–99)
Glucose-Capillary: 73 mg/dL (ref 70–99)
Glucose-Capillary: 71 mg/dL (ref 70–99)
Glucose-Capillary: 37 mg/dL — CL (ref 70–99)
Glucose-Capillary: 65 mg/dL — ABNORMAL LOW (ref 70–99)
Glucose-Capillary: 98 mg/dL (ref 70–99)
Glucose-Capillary: 60 mg/dL — ABNORMAL LOW (ref 70–99)
Glucose-Capillary: 122 mg/dL — ABNORMAL HIGH (ref 70–99)

## 2018-10-30 LAB — CBC
HCT: 23.5 % — ABNORMAL LOW (ref 39.0–52.0)
Hemoglobin: 7.5 g/dL — ABNORMAL LOW (ref 13.0–17.0)
MCH: 28.4 pg (ref 26.0–34.0)
MCHC: 31.9 g/dL (ref 30.0–36.0)
MCV: 89 fL (ref 80.0–100.0)
Platelets: 375 10*3/uL (ref 150–400)
RBC: 2.64 MIL/uL — ABNORMAL LOW (ref 4.22–5.81)
RDW: 15.3 % (ref 11.5–15.5)
WBC: 11.1 10*3/uL — ABNORMAL HIGH (ref 4.0–10.5)
nRBC: 0 % (ref 0.0–0.2)

## 2018-10-30 LAB — PHOSPHORUS: Phosphorus: 7.3 mg/dL — ABNORMAL HIGH (ref 2.5–4.6)

## 2018-10-30 LAB — BASIC METABOLIC PANEL
Anion gap: 12 (ref 5–15)
BUN: 90 mg/dL — ABNORMAL HIGH (ref 6–20)
CO2: 28 mmol/L (ref 22–32)
Calcium: 7.9 mg/dL — ABNORMAL LOW (ref 8.9–10.3)
Chloride: 104 mmol/L (ref 98–111)
Creatinine, Ser: 4.91 mg/dL — ABNORMAL HIGH (ref 0.61–1.24)
GFR calc Af Amer: 17 mL/min — ABNORMAL LOW (ref >60)
GFR calc non Af Amer: 15 mL/min — ABNORMAL LOW (ref >60)
Glucose, Bld: 92 mg/dL (ref 70–99)
Potassium: 3.7 mmol/L (ref 3.5–5.1)
Sodium: 144 mmol/L (ref 135–145)

## 2018-10-30 LAB — MAGNESIUM: Magnesium: 2.1 mg/dL (ref 1.7–2.4)

## 2018-10-30 MED ORDER — DEXTROSE 10 % IV SOLN
INTRAVENOUS | Status: DC
  Administered 2018-10-30: 22:00:00 via INTRAVENOUS

## 2018-10-30 MED ORDER — INSULIN ASPART 100 UNIT/ML ~~LOC~~ SOLN: 0.0000 [IU] | Freq: Three times a day (TID) | SUBCUTANEOUS | Status: DC

## 2018-10-30 MED ORDER — JUVEN PO PACK
1.0000 | PACK | Freq: Two times a day (BID) | ORAL | Status: DC
  Administered 2018-11-01: 1

## 2018-10-30 MED ORDER — NEPRO/CARBSTEADY PO LIQD: 1000.0000 mL | ORAL | Status: DC

## 2018-10-30 MED ORDER — PANTOPRAZOLE SODIUM 40 MG IV SOLR
40.0000 mg | INTRAVENOUS | Status: DC
  Administered 2018-10-31 – 2018-11-01 (×2): 40 mg via INTRAVENOUS
  Filled 2018-10-30 (×2): qty 40

## 2018-10-30 MED ORDER — PRO-STAT SUGAR FREE PO LIQD: 60.0000 mL | Freq: Two times a day (BID) | ORAL | Status: DC

## 2018-10-30 MED ORDER — INSULIN GLARGINE 100 UNIT/ML ~~LOC~~ SOLN
14.0000 [IU] | Freq: Two times a day (BID) | SUBCUTANEOUS | Status: DC
  Administered 2018-10-31 – 2018-11-01 (×3): 14 [IU] via SUBCUTANEOUS
  Filled 2018-10-30 (×6): qty 0.14

## 2018-10-30 MED ORDER — INSULIN ASPART 100 UNIT/ML ~~LOC~~ SOLN: 0.0000 [IU] | Freq: Every day | SUBCUTANEOUS | Status: DC

## 2018-10-30 MED ORDER — MORPHINE SULFATE (PF) 2 MG/ML IV SOLN
1.0000 mg | INTRAVENOUS | Status: DC | PRN
  Administered 2018-10-30 – 2018-11-01 (×4): 2 mg via INTRAVENOUS
  Filled 2018-10-30 (×4): qty 1

## 2018-10-30 NOTE — Procedures (Deleted)
FLEXIBLE BRONCHOSCOPY PROCEDURE NOTE    Flexible bronchoscopy was performed on 10/27/18 by :  assistance by : 1)Stacey RT  and 2)Taylor RN   Indication for the procedure was :  Pre-procedural H&P. The following assessment was performed on the day of the procedure prior to initiating sedation History:  Chest pain n Dyspnea n Hemoptysis n Cough n Fever n Other pertinent items n  Examination Vital signs -reviewed as per nursing documentation today Cardiac    Murmurs: n  Rubs : n  Gallop: n Lungs Wheezing: y Rales : y Rhonchi :y  Other pertinent findings: y   Pre-procedural assessment for Procedural Sedation included: Depth of sedation: As per anesthesia team  ASA Classification:  3 Mallampati airway assessment: 3    Medication list reviewed: y  The patient's interval history was taken and revealed: no new complaints The pre- procedure physical examination revealed: No new findings Refer to prior clinic note for details.  Informed Consent: Informed consent was obtained from:  patient after explanation of procedure and risks, benefits, as well as alternative procedures available.  Explanation of level of sedation and possible transfusion was also provided.    Procedural Preparation: Time out was performed and patient was identified by name and birthdate and procedure to be performed and side for sampling, if any, was specified. Pt was intubated by anesthesia.  The patient was appropriately draped.  Procedure Findings: Bronchoscope was inserted via ETT  without difficulty.  Posterior oropharynx, epiglottis, arytenoids, false cords and vocal cords were not visualized as these were bypassed by endotracheal tube.   The ETT was completely obstructed with thick heme pigmented plugs which required clearance.  The right lung was completely occluded with heme pigmented plugs of thick mucus which were partially cleared to RUL but unable to clear from RBI distally due to large  volume thick plugging remaining post 1 hour of debris removal.   Airways were notable for:        exophytic lesions :n       extrinsic compression in the following distributions: n.       Friable mucosa: y       Neurosurgeon /pigmentation: n   Pictorial documentation attached: n      Specimens obtained included:  Bronchial washings site: right mainstem sent for: micro                 Claudette Stapler MD  [facility]

## 2018-10-30 NOTE — Progress Notes (Signed)
Assisted tele visit to patient with mother.  Bernie Ransford, Cindra Presume, RN, CCRN

## 2018-10-30 NOTE — Progress Notes (Addendum)
Hypoglycemic Event  CBG: 37 (at 1653 hrs)  Treatment: 12.5 g D50 IV  Symptoms: none  Follow-up CBG: Time: 1720 CBG Result: 65  Treatment: 12.5 g D50 IV  Follow-up CBG: 98 Time: 1750  Possible Reasons for Event: TFs discontinued, patient NPO  Comments/MD notified:Dr. Lanney Gins notified    Shawn [name]

## 2018-10-30 NOTE — Progress Notes (Signed)
Inpatient Diabetes Program Recommendations  AACE/ADA: New Consensus Statement on Inpatient Glycemic Control  Target Ranges:  Prepandial:   less than 140 mg/dL      Peak postprandial:   less than 180 mg/dL (1-2 hours)      Critically ill patients:  140 - 180 mg/dL   Results for VICKI, PASQUAL (MRN [id number]) as of 10/30/2018 09:47  Ref. Range 10/29/2018 07:27 10/29/2018 11:44 10/29/2018 16:17 10/29/2018 20:24 10/29/2018 23:57 10/30/2018 04:57 10/30/2018 07:55  Glucose-Capillary Latest Ref Range: 70 - 99 mg/dL 127 (H)  Novolog 2 units  Lantus 14 units 132 (H)  Novolog 2 units 118 (H) 139 (H)  Novolog 2 units  Lantus 14 units 123 (H)  Novolog 2 units 75 73   Review of Glycemic Control  Diabetes history:  DM1 (makes NO insulin;is sensitive to insulin;requires basal, correction, and meal coverage insulin) Outpatient Diabetes medications:Lantus40units BID,Novolog 4 units TID with meals Current orders for Inpatient glycemic control: Lantus 14 units BID, Novolog 0-15 units Q4H  Inpatient Diabetes Program Recommendations:   Correction (SSI): Please consider decreasing Novolog correction to 0-9 units Q4H.  Thanks, [name], RN, MSN, CDE Diabetes Coordinator Inpatient Diabetes Program [phone] (Team Pager from 8am to 5pm)

## 2018-10-30 NOTE — Progress Notes (Signed)
Nutrition Follow-up  RD working remotely.  DOCUMENTATION CODES:   Severe malnutrition in context of chronic illness  INTERVENTION:  Initiate Nepro at 20 mL/hr (480 mL goal daily volume) + Pro-Stat 60 mL BID. Provides 1264 kcal, 99 grams of protein, 350 mL H2O daily. With current propofol rate provides 1670 kcal.  Provide minimum free water flush of 20-30 mL Q4hrs to maintain tube patency.  Provide Juven 1 packet BID per tube to promote wound healing.  Continue vitamin C 250 mg BID.  NUTRITION DIAGNOSIS:   Severe Malnutrition related to chronic illness(uncontrolled DM) as evidenced by moderate fat depletion, severe fat depletion, moderate muscle depletion, severe muscle depletion.  Ongoing - addressing with TF regimen.  GOAL:   Provide needs based on ASPEN/SCCM guidelines  Met with TF regimen.  MONITOR:   Vent status, Labs, Weight trends, Skin, I & O's, TF tolerance  REASON FOR ASSESSMENT:   Malnutrition Screening Tool    ASSESSMENT:   33 y.o. male with type I DM since age 89yr, IBS, gastroparesis, CKD III admitted with R foot ulcers now s/p R BKA 4/15  Patient remains intubated and sedated. On PSV with FiO2 30%, Pressure Support 10 cmH2O, PEEP 5 cmH2O. Abdomen soft per RN documentation. Having type 6 BMs (last this AM). Total of 5 BMs yesterday per chart. Tolerating tube feeds.  Enteral Access: OGT placed 4/23; terminates in distal stomach per abdominal x-ray 4/23; 70 cm corner of mouth  MAP: 81-105 mHg  TF: pt tolerating Vital 1.5 @ 45 mL/hr + Pro-Stat 30 ml daily per tube  Patient is currently intubated on ventilator support Ve: 8.8 L/min Temp (24hrs), Avg:99 F (37.2 C), Min:98.8 F (37.1 C), Max:99.4 F (37.4 C)  Propofol: 15.36 mL/hr (406 kcal daily)  Medications reviewed and include: Epogen 20000 units weekly, Novolog 0-9 units TID, Novolog 0-5 units QHS, Lantus 14 units BID, pantoprazole, vitamin B12 5000 micrograms daily, vitamin C 250 mg BID,  Dilaudid gtt, propofol gtt.  Labs reviewed: CBG 71-75, BUN 90, Creatinine 4.91, Phosphorus 7.3.  I/O: 1525 ml UOP yesterday (1 mL/kg/hr); 5 BMs yesterday  Discussed with RN and on rounds. Plan is to switch to Nepro today per Nephrologist.  Diet Order:   Diet Order    None     EDUCATION NEEDS:   Not appropriate for education at this time  Skin:  Skin Assessment: Skin Integrity Issues:(incision s/p right BKA; stg II to sacrum)  Last BM:  10/30/2018 - medium type 6  Height:   Ht Readings from Last 1 Encounters:  10/18/18 _0  (1.676 m)   Weight:   Wt Readings from Last 1 Encounters:  10/30/18 65 kg   Ideal Body Weight:  60.6 kg(adjusted for BKA)  BMI:  Body mass index is 23.13 kg/m.  Estimated Nutritional Needs:   Kcal:  14970(PSU 2003b w/ MSJ 1428, Ve 8.8, Tmax 37.4)  Protein:  85-95g/day   Fluid:  >1.5L/day   LWilley Blade MS, RD, LDN Office: [phone]: [phone] Hours/Weekend Pager: [phone]

## 2018-10-30 NOTE — Progress Notes (Signed)
[facility] Daily Progress Note   Subjective: 12 Days Post-Op: Right below-the-knee amputation  On vent, sedated. No distress.   Objective: Vitals:   10/30/18 0800 10/30/18 0801 10/30/18 0805 10/30/18 0900  BP: 122/76   121/72  Pulse: 98   99  Resp: (!) 24   (!) 24  Temp:      TempSrc:      SpO2: 99% 99% 96% 95%  Weight:      Height:        Intake/Output Summary (Last 24 hours) at 10/30/2018 1008 Last data filed at 10/30/2018 0900 Gross per 24 hour  Intake 1574.38 ml  Output 1825 ml  Net -250.62 ml   Physical Exam: On vent. Sedated.  CV: RRR Pulmonary: Decreased Bilaterally Abdomen: Soft, Nontender, Nondistended, Nontender, (+) Bowel Sounds Foley: Draining clear urine Vascular:  Right Lower Extremity: Thigh soft. Stump healthy. Incision: clean, dry and intact.    Laboratory: CBC    Component Value Date/Time   WBC 11.1 (H) 10/30/2018 0539   HGB 7.5 (L) 10/30/2018 0539   HGB 16.4 10/06/2014 0539   HCT 23.5 (L) 10/30/2018 0539   HCT 47.4 10/06/2014 0539   PLT 375 10/30/2018 0539   PLT 250 10/06/2014 0539   BMET    Component Value Date/Time   NA 144 10/30/2018 0539   NA 128 (L) 08/14/2018 1055   NA 132 (L) 10/06/2014 0539   K 3.7 10/30/2018 0539   K 3.4 (L) 10/06/2014 0539   CL 104 10/30/2018 0539   CL 91 (L) 10/06/2014 0539   CO2 28 10/30/2018 0539   CO2 26 10/06/2014 0539   GLUCOSE 92 10/30/2018 0539   GLUCOSE 634 (HH) 10/06/2014 0539   BUN 90 (H) 10/30/2018 0539   BUN 25 (H) 08/14/2018 1055   BUN 16 10/06/2014 0539   CREATININE 4.91 (H) 10/30/2018 0539   CREATININE 1.04 10/06/2014 0539   CALCIUM 7.9 (L) 10/30/2018 0539   CALCIUM 10.0 10/06/2014 0539   GFRNONAA 15 (L) 10/30/2018 0539   GFRNONAA >60 10/06/2014 0539   GFRAA 17 (L) 10/30/2018 0539   GFRAA >60 10/06/2014 0539   Assessment/Planning: The patient is a 33 year old male with multiple medical issues including peripheral artery disease status post a right below the  knee amputation approximately one week ago transferred to the ICU fordecline inhis respiratory status/hypoxemia, now intubated 1) Patient now intubated due to increasing respiratory distress/failure. Care as per ICU team. 2) Stump is healthy.  No indication for vascular intervention at this time  Discussed with Dr. Eber Hong [facility] PA-C 10/30/2018 10:08 AM

## 2018-10-30 NOTE — Progress Notes (Signed)
CRITICAL CARE NOTE         SUBJECTIVE FINDINGS & SIGNIFICANT EVENTS   For SBT today, now on 30% FiO2, CXR with bilateral airspace opacification consistent with ARDS Patient remains critically ill Prognosis is guarded    4/8 assesses by [facility] surgery-limb threatening ischemia 4/15right leg gangrene s/pright below-the-knee amputation Treated with PCA for pain, control FSBS 4/16 1 unit PRBC transfused 4/18 2 units PRBC's transfused 4/20 progressive hypoxia with acute b/l infiltrates then transferred to [facility] for further care and management 4/22 remains hypoxic  4/23 remains hypoxic DX TACO 4/23 severe hypoxia near cardiac arrest, intubated on VENT 4/27 - 245pm - patient self extubated  PAST MEDICAL HISTORY   Past Medical History:  Diagnosis Date  . CKD (chronic kidney disease)   . Diabetes mellitus without complication ([place])   . GERD (gastroesophageal reflux disease)   . HTN (hypertension)   . IBS (irritable bowel syndrome)   . Osteomyelitis [facility])      SURGICAL HISTORY   Past Surgical History:  Procedure Laterality Date  . ABDOMINAL AORTOGRAM W/LOWER EXTREMITY Right 12/23/2017   Procedure: ABDOMINAL AORTOGRAM W/LOWER EXTREMITY;  Surgeon: Katha Cabal, MD;  Location: [facility];  Service: Cardiovascular;  Laterality: Right;  . ACHILLES TENDON SURGERY Bilateral 06/16/2018   Procedure: ACHILLES LENGTHENING/KIDNER;  Surgeon: Albertine Patricia, DPM;  Location: [facility] OR[facility];  Service: Podiatry;  Laterality: Bilateral;  . AMPUTATION Right 12/24/2017   Procedure: AMPUTATION RAY;  Surgeon: Sharlotte Alamo, DPM;  Location: [facility] OR[facility];  Service: Podiatry;  Laterality: Right;  . AMPUTATION Left 04/06/2018   Procedure: AMPUTATION RAY;  Surgeon: Sharlotte Alamo, DPM;  Location: [facility];  Service: Podiatry;   Laterality: Left;  . AMPUTATION Left 04/09/2018   Procedure: AMPUTATION RAY;  Surgeon: Sharlotte Alamo, DPM;  Location: [facility] OR[facility];  Service: Podiatry;  Laterality: Left;  . AMPUTATION Right 10/18/2018   Procedure: AMPUTATION BELOW KNEE;  Surgeon: Katha Cabal, MD;  Location: [facility] ORS;  Service: Vascular;  Laterality: Right;  . APPLICATION OF WOUND VAC Right 12/24/2017   Procedure: APPLICATION OF WOUND VAC;  Surgeon: Sharlotte Alamo, DPM;  Location: [facility];  Service: Podiatry;  Laterality: Right;  . APPLICATION OF WOUND VAC Right 01/01/2018   Procedure: APPLICATION OF WOUND VAC;  Surgeon: Albertine Patricia, DPM;  Location: [facility];  Service: Podiatry;  Laterality: Right;  . BONE EXCISION Bilateral 06/16/2018   Procedure: BONE EXCISION AND SOFT TISSUE;  Surgeon: Albertine Patricia, DPM;  Location: [facility];  Service: Podiatry;  Laterality: Bilateral;  . IRRIGATION AND DEBRIDEMENT FOOT Right 12/20/2017   Procedure: IRRIGATION AND DEBRIDEMENT FOOT;  Surgeon: Sharlotte Alamo, DPM;  Location: [facility];  Service: Podiatry;  Laterality: Right;  . IRRIGATION AND DEBRIDEMENT FOOT Right 12/24/2017   Procedure: IRRIGATION AND DEBRIDEMENT FOOT;  Surgeon: Sharlotte Alamo, DPM;  Location: [facility];  Service: Podiatry;  Laterality: Right;  . IRRIGATION AND DEBRIDEMENT FOOT Right 01/01/2018   Procedure: IRRIGATION AND DEBRIDEMENT FOOT-SKIN,SOFT TISSUE AND BONE;  Surgeon: Albertine Patricia, DPM;  Location: [facility];  Service: Podiatry;  Laterality: Right;  . IRRIGATION AND DEBRIDEMENT FOOT Left 04/06/2018   Procedure: IRRIGATION AND DEBRIDEMENT FOOT;  Surgeon: Sharlotte Alamo, DPM;  Location: [facility];  Service: Podiatry;  Laterality: Left;  . IRRIGATION AND DEBRIDEMENT FOOT Right 09/20/2018   Procedure: IRRIGATION AND DEBRIDEMENT FOOT;  Surgeon: Sharlotte Alamo, DPM;  Location: [facility];  Service: Podiatry;  Laterality: Right;  . LOWER EXTREMITY ANGIOGRAPHY Left 04/06/2018   Procedure: Lower Extremity Angiography;  Surgeon: Algernon Huxley,  MD;  Location: [place] [facility];  Service: Cardiovascular;  Laterality: Left;  . LOWER EXTREMITY ANGIOGRAPHY Bilateral 10/13/2018   Procedure: Lower Extremity Angiography;  Surgeon: Katha Cabal, MD;  Location: [facility];  Service: Cardiovascular;  Laterality: Bilateral;     FAMILY HISTORY   Family History  Problem Relation Age of Onset  . Diabetes Mother   . Ovarian cancer Mother   . Healthy Father      SOCIAL HISTORY   Social History   Tobacco Use  . Smoking status: Never Smoker  . Smokeless tobacco: Never Used  Substance Use Topics  . Alcohol use: No    Frequency: Never  . Drug use: Yes    Types: Marijuana, PCP     MEDICATIONS   Current Medication:  Current Facility-Administered Medications:  .  0.9 %  sodium chloride infusion, , Intravenous, PRN, Harrie Foreman, MD, Stopped at 10/30/18 [phone] .  acetaminophen (TYLENOL) tablet 650 mg, 650 mg, Per Tube, Q6H PRN, 650 mg at 10/28/18 2028 **OR** acetaminophen (TYLENOL) suppository 650 mg, 650 mg, Rectal, Q6H PRN, Flora Lipps, MD, 650 mg at 10/26/18 1622 .  chlorhexidine gluconate (MEDLINE KIT) (PERIDEX) 0.12 % solution 15 mL, 15 mL, Mouth Rinse, BID, Darel Hong D, NP, 15 mL at 10/30/18 0820 .  dextrose 50 % solution 12.5 g, 12.5 g, Intravenous, PRN, Flora Lipps, MD, 12.5 g at 10/25/18 0024 .  epoetin alfa (EPOGEN) injection 20,000 Units, 20,000 Units, Subcutaneous, Weekly, Murlean Iba, MD, 20,000 Units at 10/26/18 1019 .  feeding supplement (PRO-STAT SUGAR FREE 64) liquid 30 mL, 30 mL, Per Tube, Daily, Flora Lipps, MD, 30 mL at 10/30/18 0923 .  feeding supplement (VITAL 1.5 CAL) liquid 1,000 mL, 1,000 mL, Per Tube, Continuous, Kasa, Kurian, MD, Last Rate: 45 mL/hr at 10/29/18 1731, 1,000 mL at 10/29/18 1731 .  hydrALAZINE (APRESOLINE) injection 10-20 mg, 10-20 mg, Intravenous, Q4H PRN, Awilda Bill, NP, 10 mg at 10/28/18 0418 .  HYDROmorphone (DILAUDID) 50 mg in sodium chloride 0.9 % 100  mL (0.5 mg/mL) infusion, 0.5-4 mg/hr, Intravenous, Continuous, Kasa, Kurian, MD, Last Rate: 4 mL/hr at 10/30/18 1100, 2 mg/hr at 10/30/18 1100 .  HYDROmorphone (DILAUDID) bolus via infusion 0.5 mg, 0.5 mg, Intravenous, Q30 min PRN, Flora Lipps, MD, 0.5 mg at 10/30/18 0821 .  insulin aspart (novoLOG) injection 0-15 Units, 0-15 Units, Subcutaneous, Q4H, Darel Hong D, NP, 2 Units at 10/30/18 0000 .  insulin glargine (LANTUS) injection 14 Units, 14 Units, Subcutaneous, BID, Bradly Bienenstock, NP, 14 Units at 10/29/18 2059 .  ipratropium-albuterol (DUONEB) 0.5-2.5 (3) MG/3ML nebulizer solution 3 mL, 3 mL, Nebulization, Q6H PRN, Blakeney, Dreama Saa, NP .  ipratropium-albuterol (DUONEB) 0.5-2.5 (3) MG/3ML nebulizer solution 3 mL, 3 mL, Nebulization, Q4H, Kasa, Kurian, MD, 3 mL at 10/30/18 0801 .  LORazepam (ATIVAN) injection 1-2 mg, 1-2 mg, Intravenous, Q4H PRN, Flora Lipps, MD, 1 mg at 10/29/18 1027 .  magnesium hydroxide (MILK OF MAGNESIA) suspension 30 mL, 30 mL, Oral, Daily PRN, Schnier, Dolores Lory, MD .  MEDLINE mouth rinse, 15 mL, Mouth Rinse, 10 times per day, Darel Hong D, NP, 15 mL at 10/30/18 0930 .  pantoprazole sodium (PROTONIX) 40 mg/20 mL oral suspension 40 mg, 40 mg, Per Tube, BID, Flora Lipps, MD, 40 mg at 10/30/18 0923 .  piperacillin-tazobactam (ZOSYN) IVPB 3.375 g, 3.375 g, Intravenous, Q12H, Schnier, Dolores Lory, MD, Last Rate: 12.5 mL/hr at 10/30/18 0923, 3.375 g at 10/30/18 0923 .  polyethylene glycol (MIRALAX / GLYCOLAX) packet 17 g, 17 g, Oral, Daily PRN, Tukov-Yual, Magdalene S, NP .  propofol (DIPRIVAN) 1000 MG/100ML infusion, 5-80 mcg/kg/min, Intravenous, Titrated, Kasa, Kurian, MD, Last Rate: 15.36 mL/hr at 10/30/18 1100, 40 mcg/kg/min at 10/30/18 1100 .  vitamin B-12 (CYANOCOBALAMIN) tablet 5,000 mcg, 5,000 mcg, Oral, Daily, Schnier, Dolores Lory, MD, 5,000 mcg at 10/30/18 [phone] .  vitamin C (ASCORBIC ACID) tablet 250 mg, 250 mg, Per Tube, BID, Flora Lipps, MD, 250 mg at  10/30/18 0923    ALLERGIES   Banana; Keflex [cephalexin]; Sulfa antibiotics; Grapeseed extract [nutritional supplements]; Shellfish allergy; and Grape seed    REVIEW OF SYSTEMS   Unable to obtain due to acutely ill status   PHYSICAL EXAMINATION   Vitals:   10/30/18 1000 10/30/18 1100  BP: 122/75 123/76  Pulse: 98 97  Resp: (!) 24 (!) 24  Temp:    SpO2: 95% 95%    GENERAL: Mild distress due to respiratory symptoms HEAD: Normocephalic, atraumatic.  EYES: Pupils equal, round, reactive to light.  No scleral icterus.  MOUTH: Moist mucosal membrane. NECK: Supple. No thyromegaly. No nodules. No JVD.  PULMONARY: Bilateral rhonchorous breath sounds CARDIOVASCULAR: S1 and S2. Regular rate and rhythm. No murmurs, rubs, or gallops.  GASTROINTESTINAL: Soft, nontender, non-distended. No masses. Positive bowel sounds. No hepatosplenomegaly.  MUSCULOSKELETAL: No swelling, clubbing, or edema.  NEUROLOGIC: Mild distress due to acute illness SKIN:intact,warm,dry   LABS AND IMAGING     -I personally reviewed most recent blood work, imaging and microbiology - significant findings today are acute kidney injury stage IV, anemia leukocytosis, chest x-ray with bilateral airspace opacification consistent with ARDS  LAB RESULTS: Recent Labs  Lab 10/28/18 2153 10/29/18 0443 10/30/18 0539  NA 139 141 144  K 3.8 3.8 3.7  CL 100 102 104  CO2 _0 BUN 91* 89* 90*  CREATININE 4.84* 4.88* 4.91*  GLUCOSE 206* 199* 92   Recent Labs  Lab 10/28/18 2153 10/29/18 0443 10/30/18 0539  HGB 7.8* 7.7* 7.5*  HCT 23.7* 24.1* 23.5*  WBC 13.2* 12.8* 11.1*  PLT 377 386 375     IMAGING RESULTS: Dg Chest Port 1 View  Result Date: 10/30/2018 CLINICAL DATA:  Acute respiratory failure. EXAM: PORTABLE CHEST 1 VIEW COMPARISON:  One-view chest x-ray 10/28/2018 FINDINGS: The heart size is normal. Patient remains intubated. The endotracheal tube terminates 4.5 cm above the carina. NG tube courses  off the inferior border the film. Interstitial and airspace disease is again noted. Overall lung volumes are slightly improved. IMPRESSION: 1. Support apparatus is stable. 2. Diffuse interstitial and airspace disease with slight improvement. Findings consistent with ARDS. Electronically Signed   By: San Morelle M.D.   On: 10/30/2018 08:29      ASSESSMENT AND PLAN    -Multidisciplinary rounds held today  Acute Hypoxic Respiratory Failure Moderate ARDS/TRALI - possible concomitant aspiration pneumonia -continue Full MV support -continue Bronchodilator Therapy -Wean Fio2 and PEEP as tolerated -will perform SAT/SBT when respiratory parameters are met  Right lower extremity critical limb ischemia    S/p BKA - surgery following -  Appreciate input   Anemia - of chronic disease and acute blood loss     -s/p PRBC transfusion    - h/h monitoring   Renal Failure-diabetic nephropathy and CKD   Nephrology on case - appreciate input -follow chem 7 -follow UO -continue Foley Catheter-assess need daily    ID -continue IV abx as prescibed -follow up cultures  GI/Nutrition GI  PROPHYLAXIS as indicated DIET-->TF's as tolerated Constipation protocol as indicated  ENDO - ICU hypoglycemic\Hyperglycemia protocol -check FSBS per protocol   ELECTROLYTES -follow labs as needed -replace as needed -pharmacy consultation   DVT/GI PRX ordered -SCDs  TRANSFUSIONS AS NEEDED MONITOR FSBS ASSESS the need for LABS as needed   Critical care provider statement:    Critical care time (minutes):  36   Critical care time was exclusive of:  Separately billable procedures and treating other patients   Critical care was necessary to treat or prevent imminent or life-threatening deterioration of the following conditions:  acute hypoxemic respiratory failure, aki, anemia, bka, critical limb ischemia. DM1   Critical care was time spent personally by me on the following activities:   Development of treatment plan with patient or surrogate, discussions with consultants, evaluation of patient's response to treatment, examination of patient, obtaining history from patient or surrogate, ordering and performing treatments and interventions, ordering and review of laboratory studies and re-evaluation of patient's condition.  I assumed direction of critical care for this patient from another provider in my specialty: no    This document was prepared using Dragon voice recognition software and may include unintentional dictation errors.    Ottie Glazier, M.D.  Division of [facility]

## 2018-10-30 NOTE — Progress Notes (Signed)
Patient ID: Shawn [name], male   DOB: 03-02-1986, 33 y.o.   MRN: [id number]  [employer] PROGRESS NOTE  KEYVIN RISON LYY:[id number] DOB: [birth year] DOA: 10/18/2018 PCP: Valera Castle, MD  HPI/Subjective: Continues on full vent support, Dilaudid infusion.  Objective: Vitals:   10/30/18 1100 10/30/18 1230  BP: 123/76   Pulse: 97   Resp: (!) 24   Temp:    SpO2: 95% 92%    Filed Weights   10/27/18 0500 10/28/18 0500 10/30/18 0400  Weight: 62.5 kg 64 kg 65 kg    ROS: Review of Systems  Unable to perform ROS: Intubated   Exam: Physical Exam  Constitutional: No distress.  HENT:  Nose: No mucosal edema.  Mouth/Throat: No oropharyngeal exudate or posterior oropharyngeal edema.  Eyes: Pupils are equal, round, and reactive to light. Conjunctivae and lids are normal.  Neck: No JVD present. Carotid bruit is not present. No edema present. No thyroid mass and no thyromegaly present.  Cardiovascular: S1 normal and S2 normal. Tachycardia present. Exam reveals no gallop.  No murmur heard. Pulses:      Dorsalis pedis pulses are 2+ on the left side.  Respiratory: No respiratory distress. He has no wheezes. He has no rhonchi. He has no rales.  GI: Soft. Bowel sounds are normal. There is no abdominal tenderness.  Musculoskeletal:     Right shoulder: He exhibits no swelling.  Lymphadenopathy:    He has no cervical adenopathy.  Neurological: No cranial nerve deficit.  Sedated.  Neuro exam cannot be performed.  Skin: Skin is warm. Nails show no clubbing.  Right lower extremity amputation site covered in pressure dressing.  Left foot prior amputation site healed well.      Data Reviewed: Basic Metabolic Panel: Recent Labs  Lab 10/24/18 0429  10/26/18 0534 10/27/18 0451  10/27/18 2153 10/28/18 0545 10/28/18 2153 10/29/18 0443 10/30/18 0539  NA 134*   < > 134* 137   < > 139 140 139 141 144  K 4.6   < > 3.5 3.1*   < > 3.5 3.4* 3.8 3.8 3.7  CL 108   < > 104 104    < > 103 103 100 102 104  CO2 17*   < > 19* 17*   < > 21* _0 GLUCOSE 81   < > 131* 294*   < > 179* 163* 206* 199* 92  BUN 86*   < > 72* 79*   < > 82* 83* 91* 89* 90*  CREATININE 3.44*   < > 3.75* 4.28*   < > 4.66* 4.67* 4.84* 4.88* 4.91*  CALCIUM 7.7*   < > 7.8* 7.9*   < > 7.8* 7.9* 7.5* 7.5* 7.9*  MG 2.0  --  1.9 2.0  --  1.9  --   --   --  2.1  PHOS  --   --   --  6.8*  --  6.7*  --   --   --  7.3*   < > = values in this interval not displayed.   CBC: Recent Labs  Lab 10/27/18 0451  10/27/18 2153 10/28/18 0545 10/28/18 2153 10/29/18 0443 10/30/18 0539  WBC 10.5  --  13.0* 14.5* 13.2* 12.8* 11.1*  NEUTROABS 8.9*  --   --  12.4*  --   --   --   HGB 5.6*   < > 8.2* 8.2* 7.8* 7.7* 7.5*  HCT 18.7*   < > 24.6* 24.5* 23.7*  24.1* 23.5*  MCV 89.0  --  84.8 84.2 85.3 87.3 89.0  PLT 358  --  393 432* 377 386 375   < > = values in this interval not displayed.    CBG: Recent Labs  Lab 10/29/18 2024 10/29/18 2357 10/30/18 0457 10/30/18 0755 10/30/18 1208  GLUCAP 139* 123* 75 73 71    Scheduled Meds: . chlorhexidine gluconate (MEDLINE KIT)  15 mL Mouth Rinse BID  . epoetin (EPOGEN/PROCRIT) injection  20,000 Units Subcutaneous Weekly  . feeding supplement (PRO-STAT SUGAR FREE 64)  30 mL Per Tube Daily  . insulin aspart  0-5 Units Subcutaneous QHS  . insulin aspart  0-9 Units Subcutaneous TID WC  . insulin glargine  14 Units Subcutaneous BID  . ipratropium-albuterol  3 mL Nebulization Q4H  . mouth rinse  15 mL Mouth Rinse 10 times per day  . pantoprazole sodium  40 mg Per Tube BID  . vitamin B-12  5,000 mcg Oral Daily  . ascorbic acid  250 mg Per Tube BID   Continuous Infusions: . sodium chloride Stopped (10/30/18 0923)  . feeding supplement (VITAL 1.5 CAL) 1,000 mL (10/29/18 1731)  . HYDROmorphone 2 mg/hr (10/30/18 1100)  . propofol (DIPRIVAN) infusion 20 mcg/kg/min (10/30/18 1227)    Assessment/Plan:  1. Severe hypoxic respiratory failure secondary to acute  pulmonary edema, aspiration pneumonia with ARDS.  Chest x-ray today still shows ARDS.  Continue full vent support with bronchodilators, IV antibiotics, wean as tolerated 2. fever due to aspiration pneumonia continue Zosyn COVID ruled out  3. postoperative anemia.  Transfuse as needed. 4. 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  Patient had right leg gangrene which was the reason for amputation.  Continue Dilaudid infusion  5.  6. uncontrolled type 2 diabetes mellitus labile regimen being adjusted regimen based on her blood sugars 7. Acute chronic kidney disease stage III.  Nephrology following  8. diabetic neuropathy resume once awake gabapentin #8 .  Metabolic acidosis, received bicarb drip, acidosis is corrected.   Code Status:     Code Status Orders  (From admission, onward)         Start     Ordered   10/18/18 1237  Full code  Continuous     10/18/18 1242        Code Status History    Date Active Date Inactive Code Status Order ID Comments User Context   10/11/2018 1157 10/13/2018 1654 Full Code [id number]  Hillary Bow, MD Inpatient   09/19/2018 2049 09/21/2018 1851 Full Code [id number]  Saundra Shelling, MD Inpatient   08/16/2018 1203 08/18/2018 2142 Full Code [id number]  Hillary Bow, MD ED   07/16/2018 1739 07/18/2018 0104 Full Code [id number]  Dustin Flock, MD Inpatient   07/10/2018 0111 07/11/2018 1955 Full Code [id number]  Salary, Avel Peace, MD Inpatient   06/14/2018 1744 06/20/2018 2123 Full Code [id number]  Loletha Grayer, MD ED   05/28/2018 0437 05/28/2018 1926 Full Code [id number]  Arta Silence, MD ED   04/27/2018 1726 05/02/2018 1713 Full Code [id number]  Henreitta Leber, MD Inpatient   04/05/2018 1724 04/11/2018 2142 Full Code [id number]  Dustin Flock, MD Inpatient   02/06/2018 2022 02/07/2018 1512 Full Code [id number]  Demetrios Loll, MD Inpatient   01/10/2018 2259 01/11/2018 1947 Full Code [id number]  Amelia Jo, MD Inpatient   12/30/2017 0019 01/03/2018  1457 Full Code [id number]  Harrie Foreman, MD Inpatient   12/20/2017 1541 12/27/2017 2147 Full Code [id number]  Sharlotte Alamo, DPM Inpatient  11/10/2017 0049 11/11/2017 1632 Full Code [id number]  Amelia Jo, MD Inpatient   07/13/2017 2112 07/15/2017 1730 Full Code [id number]  Jules Husbands, MD ED      Disposition Plan: To be determined  Consultants:  Vascular surgery  Procedures:  Right leg below-knee amputation Prognosis poor, high risk for cardiac arrest Time spent: 47mn critical care time  [employer]

## 2018-10-30 NOTE — Progress Notes (Signed)
Patient has a cuff leak

## 2018-10-30 NOTE — Progress Notes (Signed)
[place], [place] 10/30/18  Subjective:  Patient remains critically ill at this time. Still on the ventilator. Urine output was 1.5 L over the preceding 24 hours. However creatinine remains high at 4.9.    Objective:  Vital signs in last 24 hours:  Temp:  [98.8 F (37.1 C)-99.5 F (37.5 C)] 99 F (37.2 C) (04/27 0400) Pulse Rate:  [97-120] 97 (04/27 1100) Resp:  [11-24] 24 (04/27 1100) BP: (115-152)/(67-88) 123/76 (04/27 1100) SpO2:  [92 %-100 %] 95 % (04/27 1100) FiO2 (%):  [30 %-55 %] 30 % (04/27 0805) Weight:  [65 kg] 65 kg (04/27 0400)  Weight change:  Filed Weights   10/27/18 0500 10/28/18 0500 10/30/18 0400  Weight: 62.5 kg 64 kg 65 kg    Intake/Output:    Intake/Output Summary (Last 24 hours) at 10/30/2018 1127 Last data filed at 10/30/2018 1100 Gross per 24 hour  Intake 1810.82 ml  Output 1975 ml  Net -164.18 ml     Physical Exam: General:  Chronically ill-appearing, thin  HEENT  anicteric, ETT in place  Neck  supple  Pulm/lungs  bilaterlal coarse breath sounds, vent assisted  CVS/Heart  S1S2 no rubs  Abdomen:   Soft, nontender  Extremities:  Right BKA, no edema on left  Neurologic:  sedated  Skin:  No acute rashes    Foley in place       Basic Metabolic Panel:  Recent Labs  Lab 10/24/18 0429  10/26/18 0534 10/27/18 0451  10/27/18 2153 10/28/18 0545 10/28/18 2153 10/29/18 0443 10/30/18 0539  NA 134*   < > 134* 137   < > 139 140 139 141 144  K 4.6   < > 3.5 3.1*   < > 3.5 3.4* 3.8 3.8 3.7  CL 108   < > 104 104   < > 103 103 100 102 104  CO2 17*   < > 19* 17*   < > 21* '23 25 26 28  '$ GLUCOSE 81   < > 131* 294*   < > 179* 163* 206* 199* 92  BUN 86*   < > 72* 79*   < > 82* 83* 91* 89* 90*  CREATININE 3.44*   < > 3.75* 4.28*   < > 4.66* 4.67* 4.84* 4.88* 4.91*  CALCIUM 7.7*   < > 7.8* 7.9*   < > 7.8* 7.9* 7.5* 7.5* 7.9*  MG 2.0  --  1.9 2.0  --  1.9  --   --   --  2.1  PHOS  --   --   --  6.8*  --  6.7*  --    --   --  7.3*   < > = values in this interval not displayed.     CBC: Recent Labs  Lab 10/27/18 0451  10/27/18 2153 10/28/18 0545 10/28/18 2153 10/29/18 0443 10/30/18 0539  WBC 10.5  --  13.0* 14.5* 13.2* 12.8* 11.1*  NEUTROABS 8.9*  --   --  12.4*  --   --   --   HGB 5.6*   < > 8.2* 8.2* 7.8* 7.7* 7.5*  HCT 18.7*   < > 24.6* 24.5* 23.7* 24.1* 23.5*  MCV 89.0  --  84.8 84.2 85.3 87.3 89.0  PLT 358  --  393 432* 377 386 375   < > = values in this interval not displayed.     No results found for: HEPBSAG, HEPBSAB, HEPBIGM    Microbiology:  Recent Results (from  the past 240 hour(s))  Culture, blood (x 2)     Status: None   Collection Time: 10/23/18  6:49 AM  Result Value Ref Range Status   Specimen Description BLOOD RIGHT ARM  Final   Special Requests   Final    BOTTLES DRAWN AEROBIC AND ANAEROBIC Blood Culture results may not be optimal due to an excessive volume of blood received in culture bottles   Culture   Final    NO GROWTH 5 DAYS Performed at [facility], [address] [place], [place] [address]    Report Status 10/28/2018 FINAL  Final  Culture, blood (x 2)     Status: None   Collection Time: 10/23/18  6:49 AM  Result Value Ref Range Status   Specimen Description BLOOD LEFT WRIST  Final   Special Requests BOTTLES DRAWN AEROBIC AND ANAEROBIC [facility]  Final   Culture   Final    NO GROWTH 5 DAYS Performed at [facility], [address] [place], [place] [address]    Report Status 10/28/2018 FINAL  Final  MRSA PCR Screening     Status: None   Collection Time: 10/23/18  7:40 AM  Result Value Ref Range Status   MRSA by PCR NEGATIVE NEGATIVE Final    Comment:        The GeneXpert MRSA Assay (FDA approved for NASAL specimens only), is one component of a comprehensive MRSA colonization surveillance program. It is not intended to diagnose MRSA infection nor to guide or monitor treatment for MRSA infections. Performed at [facility], [address] [place], [place] [address]   SARS Coronavirus 2 [facility] order, Performed in [facility] hospital lab)     Status: None   Collection Time: 10/23/18  6:06 PM  Result Value Ref Range Status   SARS Coronavirus 2 NEGATIVE NEGATIVE Final    Comment: (NOTE) If result is NEGATIVE SARS-CoV-2 target nucleic acids are NOT DETECTED. The SARS-CoV-2 RNA is generally detectable in upper and lower  respiratory specimens during the acute phase of infection. The lowest  concentration of SARS-CoV-2 viral copies this assay can detect is 250  copies / mL. A negative result does not preclude SARS-CoV-2 infection  and should not be used as the sole basis for treatment or other  patient management decisions.  A negative result may occur with  improper specimen collection / handling, submission of specimen other  than nasopharyngeal swab, presence of viral mutation(s) within the  areas targeted by this assay, and inadequate number of viral copies  (<250 copies / mL). A negative result must be combined with clinical  observations, patient history, and epidemiological information. If result is POSITIVE SARS-CoV-2 target nucleic acids are DETECTED. The SARS-CoV-2 RNA is generally detectable in upper and lower  respiratory specimens dur ing the acute phase of infection.  Positive  results are indicative of active infection with SARS-CoV-2.  Clinical  correlation with patient history and other diagnostic information is  necessary to determine patient infection status.  Positive results do  not rule out bacterial infection or co-infection with other viruses. If result is PRESUMPTIVE POSTIVE SARS-CoV-2 nucleic acids MAY BE PRESENT.   A presumptive positive result was obtained on the submitted specimen  and confirmed on repeat testing.  While 2019 novel coronavirus  (SARS-CoV-2) nucleic acids may be present in the submitted sample  additional confirmatory testing may be necessary  for epidemiological  and / or clinical management purposes  to differentiate between  SARS-CoV-2  and other Sarbecovirus currently known to infect humans.  If clinically indicated additional testing with an alternate test  methodology [phone]) is advised. The SARS-CoV-2 RNA is generally  detectable in upper and lower respiratory sp ecimens during the acute  phase of infection. The expected result is Negative. Fact Sheet for Patients:  StrictlyIdeas.no Fact Sheet for Healthcare Providers: BankingDealers.co.za This test is not yet approved or cleared by the [place] FDA and has been authorized for detection and/or diagnosis of SARS-CoV-2 by FDA under an Emergency Use Authorization (EUA).  This EUA will remain in effect (meaning this test can be used) for the duration of the COVID-19 declaration under Section 564(b)(1) of the Act, 21 U.S.C. section 360bbb-3(b)(1), unless the authorization is terminated or revoked sooner. Performed at [facility], [address] [place], [place] [address]   Respiratory Panel by PCR     Status: None   Collection Time: 10/24/18  9:17 AM  Result Value Ref Range Status   Adenovirus NOT DETECTED NOT DETECTED Final   Coronavirus 229E NOT DETECTED NOT DETECTED Final    Comment: (NOTE) The Coronavirus on the Respiratory Panel, DOES NOT test for the novel  Coronavirus (2019 nCoV)    Coronavirus HKU1 NOT DETECTED NOT DETECTED Final   Coronavirus NL63 NOT DETECTED NOT DETECTED Final   Coronavirus OC43 NOT DETECTED NOT DETECTED Final   Metapneumovirus NOT DETECTED NOT DETECTED Final   Rhinovirus / Enterovirus NOT DETECTED NOT DETECTED Final   Influenza A NOT DETECTED NOT DETECTED Final   Influenza B NOT DETECTED NOT DETECTED Final   Parainfluenza Virus 1 NOT DETECTED NOT DETECTED Final   Parainfluenza Virus 2 NOT DETECTED NOT DETECTED Final   Parainfluenza Virus 3 NOT DETECTED NOT DETECTED Final    Parainfluenza Virus 4 NOT DETECTED NOT DETECTED Final   Respiratory Syncytial Virus NOT DETECTED NOT DETECTED Final   Bordetella pertussis NOT DETECTED NOT DETECTED Final   Chlamydophila pneumoniae NOT DETECTED NOT DETECTED Final   Mycoplasma pneumoniae NOT DETECTED NOT DETECTED Final    Comment: Performed at [facility], [place] [place], [place] [address]  Culture, respiratory     Status: None (Preliminary result)   Collection Time: 10/29/18 12:06 PM  Result Value Ref Range Status   Specimen Description   Final    TRACHEAL ASPIRATE Performed at [facility], [address], [place], [place] [address]    Special Requests   Final    NONE Performed at [facility], [place], [place] [address]    Gram Stain   Final    MODERATE WBC PRESENT,BOTH PMN AND MONONUCLEAR RARE SQUAMOUS EPITHELIAL CELLS PRESENT RARE YEAST WITH PSEUDOHYPHAE    Culture   Final    CULTURE REINCUBATED FOR BETTER GROWTH Performed at [facility], [place] [address], [place], [place] [address]    Report Status PENDING  Incomplete    Coagulation Studies: Recent Labs    10/28/18 0545  LABPROT 15.3*  INR 1.2    Urinalysis: No results for input(s): COLORURINE, LABSPEC, PHURINE, GLUCOSEU, HGBUR, BILIRUBINUR, KETONESUR, PROTEINUR, UROBILINOGEN, NITRITE, LEUKOCYTESUR in the last 72 hours.  Invalid input(s): APPERANCEUR    Imaging: Dg Chest Port 1 View  Result Date: 10/30/2018 CLINICAL DATA:  Acute respiratory failure. EXAM: PORTABLE CHEST 1 VIEW COMPARISON:  One-view chest x-ray 10/28/2018 FINDINGS: The heart size is normal. Patient remains intubated. The endotracheal tube terminates 4.5 cm above the carina. NG tube courses off the inferior border  the film. Interstitial and airspace disease is again noted. Overall lung volumes are slightly improved. IMPRESSION: 1. Support apparatus is stable. 2. Diffuse interstitial and airspace disease with slight  improvement. Findings consistent with ARDS. Electronically Signed   By: San Morelle M.D.   On: 10/30/2018 08:29     Medications:   . sodium chloride Stopped (10/30/18 0923)  . feeding supplement (VITAL 1.5 CAL) 1,000 mL (10/29/18 1731)  . HYDROmorphone 2 mg/hr (10/30/18 1100)  . piperacillin-tazobactam (ZOSYN)  IV 3.375 g (10/30/18 0923)  . propofol (DIPRIVAN) infusion 40 mcg/kg/min (10/30/18 1100)   . chlorhexidine gluconate (MEDLINE KIT)  15 mL Mouth Rinse BID  . epoetin (EPOGEN/PROCRIT) injection  20,000 Units Subcutaneous Weekly  . feeding supplement (PRO-STAT SUGAR FREE 64)  30 mL Per Tube Daily  . insulin aspart  0-15 Units Subcutaneous Q4H  . insulin glargine  14 Units Subcutaneous BID  . ipratropium-albuterol  3 mL Nebulization Q4H  . mouth rinse  15 mL Mouth Rinse 10 times per day  . pantoprazole sodium  40 mg Per Tube BID  . vitamin B-12  5,000 mcg Oral Daily  . ascorbic acid  250 mg Per Tube BID   sodium chloride, acetaminophen **OR** acetaminophen, dextrose, hydrALAZINE, HYDROmorphone, ipratropium-albuterol, LORazepam, magnesium hydroxide, polyethylene glycol  Assessment/ Plan:  33 y.o. 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 2D echo from December 2019 shows moderate concentric hypertrophy, EF 50%, diffuse hypokinesis, grade 1 diastolic dysfunction  1.  Acute kidney injury on chronic kidney disease stage III Baseline creatinine of 1.8/GFR 49 from February 2020 Acute kidney injury appears to be multifactorial  ANA, ANCA are negative; complements normal -Overall patient remains critically ill at the moment.  Urine output was 1.5 L over the preceding 24 hours.  Creatinine remains high at 4.9.  However no urgent  indication to proceed with dialysis at the moment.  We will continue to monitor renal parameters closely daily.  2.  Acute hypoxic respiratory failure Unclear cause but possibly volume overload versus pneumonia.   Patient is getting IV antibiotics empirically -Continue ventilatory support.  Patient originally intubated on October 26, 2018.  3.  Diabetes type 1 with CKD Diabetes is poorly controlled Hemoglobin A1c 13.7% on September 20, 2018  4.  Anemia of chronic kidney disease and blood loss. -Hemoglobin 7.5 at last check.  Maintain the patient on Epogen 20,000 units subcutaneous weekly.    LOS: 12 Shawn [name] 4/27/202011:27 [place], [place]  Note: This note was prepared with Dragon dictation. Any transcription errors are unintentional

## 2018-10-30 NOTE — Progress Notes (Signed)
Patient self-extubated around 1400 hrs this afternoon during SBT. Patient was not sedated but was in mitts. Dr. Margaretmary Dys immediately notified. Patient place on HFNC. Stable for now. Able to vocalize words. Working with IS. Will CTM.

## 2018-10-30 NOTE — Progress Notes (Signed)
Shift summary: patient weaned for approximately 45 mins this AM on PSV 5/5 @ 30% FiO2. Patient then self-extubated at approx 1400 hrs. Immediately placed on HFNC. Remains NPO. Foley still in. Now requiring heated HFNC for higher flow rate. Hypoglycemic episode today as well; required 1 full amp of D50W. CTM.

## 2018-10-30 NOTE — Progress Notes (Signed)
RSBI is 29.7

## 2018-10-30 NOTE — Progress Notes (Signed)
Pt self extubated and was placed on HFNC of 10L. He had to be increased to 15 L for decreased Sa02.

## 2018-10-30 NOTE — Plan of Care (Signed)

## 2018-10-31 ENCOUNTER — Inpatient Hospital Stay: Payer: Medicaid Other

## 2018-10-31 LAB — BASIC METABOLIC PANEL
Anion gap: 22 — ABNORMAL HIGH (ref 5–15)
BUN: 93 mg/dL — ABNORMAL HIGH (ref 6–20)
CO2: 21 mmol/L — ABNORMAL LOW (ref 22–32)
Calcium: 8.5 mg/dL — ABNORMAL LOW (ref 8.9–10.3)
Chloride: 102 mmol/L (ref 98–111)
Creatinine, Ser: 4.92 mg/dL — ABNORMAL HIGH (ref 0.61–1.24)
GFR calc Af Amer: 17 mL/min — ABNORMAL LOW (ref >60)
GFR calc non Af Amer: 15 mL/min — ABNORMAL LOW (ref >60)
Glucose, Bld: 171 mg/dL — ABNORMAL HIGH (ref 70–99)
Potassium: 4 mmol/L (ref 3.5–5.1)
Sodium: 145 mmol/L (ref 135–145)

## 2018-10-31 LAB — CBC WITH DIFFERENTIAL/PLATELET
Abs Immature Granulocytes: 0.28 10*3/uL — ABNORMAL HIGH (ref 0.00–0.07)
Basophils Absolute: 0.1 10*3/uL (ref 0.0–0.1)
Basophils Relative: 0 %
Eosinophils Absolute: 0.1 10*3/uL (ref 0.0–0.5)
Eosinophils Relative: 0 %
HCT: 31.6 % — ABNORMAL LOW (ref 39.0–52.0)
Hemoglobin: 9.9 g/dL — ABNORMAL LOW (ref 13.0–17.0)
Immature Granulocytes: 1 %
Lymphocytes Relative: 2 %
Lymphs Abs: 0.5 10*3/uL — ABNORMAL LOW (ref 0.7–4.0)
MCH: 27.8 pg (ref 26.0–34.0)
MCHC: 31.3 g/dL (ref 30.0–36.0)
MCV: 88.8 fL (ref 80.0–100.0)
Monocytes Absolute: 0.6 10*3/uL (ref 0.1–1.0)
Monocytes Relative: 3 %
Neutro Abs: 18.9 10*3/uL — ABNORMAL HIGH (ref 1.7–7.7)
Neutrophils Relative %: 94 %
Platelets: 580 10*3/uL — ABNORMAL HIGH (ref 150–400)
RBC: 3.56 MIL/uL — ABNORMAL LOW (ref 4.22–5.81)
RDW: 15.4 % (ref 11.5–15.5)
WBC: 20.3 10*3/uL — ABNORMAL HIGH (ref 4.0–10.5)
nRBC: 0 % (ref 0.0–0.2)

## 2018-10-31 LAB — GLUCOSE, CAPILLARY
Glucose-Capillary: 139 mg/dL — ABNORMAL HIGH (ref 70–99)
Glucose-Capillary: 141 mg/dL — ABNORMAL HIGH (ref 70–99)
Glucose-Capillary: 162 mg/dL — ABNORMAL HIGH (ref 70–99)
Glucose-Capillary: 184 mg/dL — ABNORMAL HIGH (ref 70–99)
Glucose-Capillary: 204 mg/dL — ABNORMAL HIGH (ref 70–99)
Glucose-Capillary: 235 mg/dL — ABNORMAL HIGH (ref 70–99)
Glucose-Capillary: 271 mg/dL — ABNORMAL HIGH (ref 70–99)
Glucose-Capillary: 325 mg/dL — ABNORMAL HIGH (ref 70–99)
Glucose-Capillary: 39 mg/dL — CL (ref 70–99)

## 2018-10-31 LAB — FUNGITELL, SERUM: Fungitell Result: 44 pg/mL (ref <80)

## 2018-10-31 MED ORDER — INSULIN ASPART 100 UNIT/ML ~~LOC~~ SOLN
0.0000 [IU] | Freq: Three times a day (TID) | SUBCUTANEOUS | Status: DC
  Administered 2018-11-01: 5 [IU] via SUBCUTANEOUS
  Administered 2018-11-01: 11 [IU] via SUBCUTANEOUS
  Filled 2018-10-31 (×2): qty 1

## 2018-10-31 MED ORDER — ONDANSETRON HCL 4 MG/2ML IJ SOLN
4.0000 mg | Freq: Three times a day (TID) | INTRAMUSCULAR | Status: DC
  Filled 2018-10-31: qty 2

## 2018-10-31 MED ORDER — ONDANSETRON HCL 4 MG/2ML IJ SOLN
4.0000 mg | Freq: Three times a day (TID) | INTRAMUSCULAR | Status: DC | PRN
  Administered 2018-10-31 – 2018-11-01 (×2): 4 mg via INTRAVENOUS
  Filled 2018-10-31: qty 2

## 2018-10-31 MED ORDER — INSULIN ASPART 100 UNIT/ML ~~LOC~~ SOLN
0.0000 [IU] | Freq: Every day | SUBCUTANEOUS | Status: DC
  Administered 2018-11-01: 4 [IU] via SUBCUTANEOUS
  Filled 2018-10-31: qty 1

## 2018-10-31 MED ORDER — INSULIN ASPART 100 UNIT/ML ~~LOC~~ SOLN
10.0000 [IU] | Freq: Once | SUBCUTANEOUS | Status: AC
  Administered 2018-11-01: 10 [IU] via SUBCUTANEOUS
  Filled 2018-10-31: qty 1

## 2018-10-31 NOTE — Progress Notes (Signed)
[facility] Daily Progress Note   Subjective: 13 Days Post-Op: Right below-the-knee amputation  Patient self extubated yesterday afternoon. Calm this AM. On high flow nasal cannula.   Objective: Vitals:   10/31/18 0400 10/31/18 0450 10/31/18 0600 10/31/18 0800  BP: (!) 155/96  (!) 132/92   Pulse: (!) 118  (!) 115   Resp: 14  16   Temp:  98.4 F (36.9 C)  98.2 F (36.8 C)  TempSrc:  Oral  Oral  SpO2: 99%  100%   Weight:      Height:        Intake/Output Summary (Last 24 hours) at 10/31/2018 1134 Last data filed at 10/31/2018 5329 Gross per 24 hour  Intake 465.83 ml  Output 1865 ml  Net -1399.17 ml   Physical Exam: A&Ox3, NAD CV: RRR Pulmonary: CTA Bilaterally Abdomen: Soft, Nontender, Nondistended, (+) Bowel Sounds Foley: Intact, draining clear urine Vascular:  Right lower extremity: Thigh soft. Able to straighten at the knee. Stump healthy. Incision clean, dry and intact   Laboratory: CBC    Component Value Date/Time   WBC 20.3 (H) 10/31/2018 0355   HGB 9.9 (L) 10/31/2018 0355   HGB 16.4 10/06/2014 0539   HCT 31.6 (L) 10/31/2018 0355   HCT 47.4 10/06/2014 0539   PLT 580 (H) 10/31/2018 0355   PLT 250 10/06/2014 0539   BMET    Component Value Date/Time   NA 145 10/31/2018 0355   NA 128 (L) 08/14/2018 1055   NA 132 (L) 10/06/2014 0539   K 4.0 10/31/2018 0355   K 3.4 (L) 10/06/2014 0539   CL 102 10/31/2018 0355   CL 91 (L) 10/06/2014 0539   CO2 21 (L) 10/31/2018 0355   CO2 26 10/06/2014 0539   GLUCOSE 171 (H) 10/31/2018 0355   GLUCOSE 634 (HH) 10/06/2014 0539   BUN 93 (H) 10/31/2018 0355   BUN 25 (H) 08/14/2018 1055   BUN 16 10/06/2014 0539   CREATININE 4.92 (H) 10/31/2018 0355   CREATININE 1.04 10/06/2014 0539   CALCIUM 8.5 (L) 10/31/2018 0355   CALCIUM 10.0 10/06/2014 0539   GFRNONAA 15 (L) 10/31/2018 0355   GFRNONAA >60 10/06/2014 0539   GFRAA 17 (L) 10/31/2018 0355   GFRAA >60 10/06/2014 0539   Assessment/Planning: The  patient is a 33 year old male with multiple medical issues including peripheral artery disease status post a right below the knee amputation approximately one week ago transferred to the ICU fordecline inhis respiratory status/hypoxemia,now intubated, self extubated on 10/30/18 1) Patient self extubated yesterday afternoon.  Currently, on high flow nasal cannula. 2) Stump is healthy. No indication for vascular intervention at this time  Discussed with Dr. Eber Hong [facility] PA-C 10/31/2018 11:34 AM

## 2018-10-31 NOTE — TOC Progression Note (Signed)
Transition of Care [facility]) - Progression Note    Patient Details  Name: USIEL ASTARITA MRN: [id number] Date of Birth: [birth year]  Transition of Care [facility]) CM/SW Contact  Shela Leff, [place] Phone Number: 10/31/2018, 3:23 PM  Clinical Narrative:   Patient's bed search was placed on hold as he was transferred to the ICU and placed on the ventilator. Patient self extubated yesterday and has remained on HFNC. CSW will continue to follow.    Expected Discharge Plan: [place] Barriers to Discharge: No Barriers Identified  Expected Discharge Plan and Services Expected Discharge Plan: [place] arrangements for the past 2 months: Single Family Home Expected Discharge Date: 10/20/18                                     Social Determinants of Health (SDOH) Interventions    Readmission Risk Interventions Readmission Risk Prevention Plan 10/13/2018 09/21/2018 09/21/2018  Transportation Screening Complete Complete Complete  Medication Review Press photographer) Complete Complete Complete  PCP or Specialist appointment within 3-5 days of discharge Complete Complete Complete  HRI or Home Care Consult Complete Complete -  SW Recovery Care/Counseling Consult (No Data) Complete -  Palliative Care Screening Not Applicable Not Applicable [place] Not Applicable Not Applicable -  Some recent data might be hidden

## 2018-10-31 NOTE — Progress Notes (Signed)
Assisted tele visit to patient with mother.  Alver Sorrow, RN

## 2018-10-31 NOTE — Progress Notes (Signed)
Pt given some water and coughed immediately after drinking it. Order placed for swallow eval. RN explained to pt and he voices understanding. Will continue to monitor.

## 2018-10-31 NOTE — Progress Notes (Signed)
Patient calm and cooperative all night, High flow titrated down to 55% with no SOB or complications. Patient would attempt to remove high flow and sitter at bedside redirected patient. Morphine given once for pain to right leg. Pain medicine effective and patient intermittently slept throughout the night.

## 2018-10-31 NOTE — Progress Notes (Signed)
Patient ID: Shawn [name], male   DOB: 1986-03-10, 33 y.o.   MRN: [id number]  [facility] PROGRESS NOTE  Shawn [name]:[id number] DOB: 1986-02-20 DOA: 10/18/2018 PCP: Valera Castle, MD  HPI/Subjective: Patient self extubated yesterday.  Now on high flow nasal cannula, sitter at bedside.  Appears alert and complains of right leg pain and also wants to eat.  Objective: Vitals:   10/31/18 0600 10/31/18 0800  BP: (!) 132/92   Pulse: (!) 115   Resp: 16   Temp:  98.2 F (36.8 C)  SpO2: 100%     Filed Weights   10/27/18 0500 10/28/18 0500 10/30/18 0400  Weight: 62.5 kg 64 kg 65 kg    ROS: Patient is slightly confused so unable to obtain review of systems. ROS Exam:  Slightly confused, extubated, on high flow nasal cannula at 40 L/min  Cardiovascular: S1, S2 slightly tachycardic  Lungs clear to auscultation, no wheeze, no rales  Neurologically patient is slightly confused but no gross neurological deficit observed extremities right BKA site is clean.  Abdomen soft, nontender, nondistended, bowel sounds present.  Data Reviewed: Basic Metabolic Panel: Recent Labs  Lab 10/26/18 0534 10/27/18 0451  10/27/18 2153 10/28/18 0545 10/28/18 2153 10/29/18 0443 10/30/18 0539 10/31/18 0355  NA 134* 137   < > 139 140 139 141 144 145  K 3.5 3.1*   < > 3.5 3.4* 3.8 3.8 3.7 4.0  CL 104 104   < > 103 103 100 102 104 102  CO2 19* 17*   < > 21* '23 25 26 28 '$ 21*  GLUCOSE 131* 294*   < > 179* 163* 206* 199* 92 171*  BUN 72* 79*   < > 82* 83* 91* 89* 90* 93*  CREATININE 3.75* 4.28*   < > 4.66* 4.67* 4.84* 4.88* 4.91* 4.92*  CALCIUM 7.8* 7.9*   < > 7.8* 7.9* 7.5* 7.5* 7.9* 8.5*  MG 1.9 2.0  --  1.9  --   --   --  2.1  --   PHOS  --  6.8*  --  6.7*  --   --   --  7.3*  --    < > = values in this interval not displayed.   CBC: Recent Labs  Lab 10/27/18 0451  10/28/18 0545 10/28/18 2153 10/29/18 0443 10/30/18 0539 10/31/18 0355  WBC 10.5   < > 14.5* 13.2* 12.8*  11.1* 20.3*  NEUTROABS 8.9*  --  12.4*  --   --   --  18.9*  HGB 5.6*   < > 8.2* 7.8* 7.7* 7.5* 9.9*  HCT 18.7*   < > 24.5* 23.7* 24.1* 23.5* 31.6*  MCV 89.0   < > 84.2 85.3 87.3 89.0 88.8  PLT 358   < > 432* 377 386 375 580*   < > = values in this interval not displayed.    CBG: Recent Labs  Lab 10/30/18 2104 10/31/18 0000 10/31/18 0213 10/31/18 0407 10/31/18 0736  GLUCAP 122* 141* 139* 162* 184*    Scheduled Meds: . chlorhexidine gluconate (MEDLINE KIT)  15 mL Mouth Rinse BID  . epoetin (EPOGEN/PROCRIT) injection  20,000 Units Subcutaneous Weekly  . insulin glargine  14 Units Subcutaneous BID  . ipratropium-albuterol  3 mL Nebulization Q4H  . nutrition supplement (JUVEN)  1 packet Per Tube BID BM  . pantoprazole (PROTONIX) IV  40 mg Intravenous Q24H  . vitamin B-12  5,000 mcg Oral Daily  . ascorbic acid  250 mg Per  Tube BID   Continuous Infusions: . sodium chloride Stopped (10/30/18 1438)  . dextrose 15 mL/hr at 10/31/18 0500  . HYDROmorphone Stopped (10/30/18 1229)  . propofol (DIPRIVAN) infusion Stopped (10/30/18 1229)    Assessment/Plan:  1. Severe hypoxic respiratory failure secondary to acute pulmonary edema, aspiration pneumonia with ARDS.  Chest x-ray today still shows ARDS.  Self extubated yesterday, on high flow nasal cannula, appears to be tolerating well.  Fever due to aspiration pneumonia continue Zosyn COVID ruled out  2. postoperative anemia.  Transfuse as needed. 3. 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  Patient had right leg gangrene which was the reason for amputation.   4.  5. uncontrolled type 2 diabetes mellitus labile regimen being adjusted regimen based on her blood sugars, continue low-dose Lantus, 6. Acute chronic kidney disease stage III.  Nephrology following continues to have renal failure creatinine up to 4.92. 7. diabetic neuropathy ' may need to start back on Neurontin. #8 .  Metabolic acidosis, received bicarb  drip, acidosis is corrected.   Code Status:     Code Status Orders  (From admission, onward)         Start     Ordered   10/18/18 1237  Full code  Continuous     10/18/18 1242        Code Status History    Date Active Date Inactive Code Status Order ID Comments User Context   10/11/2018 1157 10/13/2018 1654 Full Code [id number]  Hillary Bow, MD Inpatient   09/19/2018 2049 09/21/2018 1851 Full Code [id number]  Saundra Shelling, MD Inpatient   08/16/2018 1203 08/18/2018 2142 Full Code [id number]  Hillary Bow, MD ED   07/16/2018 1739 07/18/2018 0104 Full Code [id number]  Dustin Flock, MD Inpatient   07/10/2018 0111 07/11/2018 1955 Full Code [id number]  Salary, Avel Peace, MD Inpatient   06/14/2018 1744 06/20/2018 2123 Full Code [id number]  Loletha Grayer, MD ED   05/28/2018 0437 05/28/2018 1926 Full Code [id number]  Arta Silence, MD ED   04/27/2018 1726 05/02/2018 1713 Full Code [id number]  Henreitta Leber, MD Inpatient   04/05/2018 1724 04/11/2018 2142 Full Code [id number]  Dustin Flock, MD Inpatient   02/06/2018 2022 02/07/2018 1512 Full Code [id number]  Demetrios Loll, MD Inpatient   01/10/2018 2259 01/11/2018 1947 Full Code [id number]  Amelia Jo, MD Inpatient   12/30/2017 0019 01/03/2018 1457 Full Code [id number]  Harrie Foreman, MD Inpatient   12/20/2017 1541 12/27/2017 2147 Full Code [id number]  Sharlotte Alamo, [facility] Inpatient   11/10/2017 0049 11/11/2017 1632 Full Code [id number]  Amelia Jo, MD Inpatient   07/13/2017 2112 07/15/2017 1730 Full Code [id number]  Jules Husbands, MD ED      Disposition Plan: To be determined  Consultants:  Vascular surgery  Procedures:  Right leg below-knee amputation Prognosis poor, high risk for cardiac arrest Time spent: 89mn critical care time  [employer]

## 2018-10-31 NOTE — Progress Notes (Signed)
PHARMACY CONSULT NOTE - FOLLOW UP  Pharmacy Consult for Electrolyte Monitoring and Replacement   Recent Labs: Potassium (mmol/L)  Date Value  10/31/2018 4.0  10/06/2014 3.4 (L)   Magnesium (mg/dL)  Date Value  10/30/2018 2.1   Calcium (mg/dL)  Date Value  10/31/2018 8.5 (L)   Calcium, Total (mg/dL)  Date Value  10/06/2014 10.0   Albumin (g/dL)  Date Value  10/29/2018 1.3 (L)  08/14/2018 3.6 (L)  10/06/2014 5.3 (H)   Phosphorus (mg/dL)  Date Value  10/30/2018 7.3 (H)   Sodium (mmol/L)  Date Value  10/31/2018 145  08/14/2018 128 (L)  10/06/2014 132 (L)     Assessment: 33 yo male admitted with respiratory failure.  Goal of Therapy:  Potassium ~4.0, Magnesium ~2.0  Plan:  Electrolytes WNL. Will order electrolytes with AM labs.  Pharmacy will continue to monitor.   Paticia Stack, PharmD Pharmacy Resident  10/31/2018 3:43 PM

## 2018-10-31 NOTE — Progress Notes (Signed)
Inpatient Diabetes Program Recommendations  AACE/ADA: New Consensus Statement on Inpatient Glycemic Control  Target Ranges:  Prepandial:   less than 140 mg/dL      Peak postprandial:   less than 180 mg/dL (1-2 hours)      Critically ill patients:  140 - 180 mg/dL   Results for MONTEE, TALLMAN (MRN [id number]) as of 10/31/2018 07:47  Ref. Range 10/30/2018 07:55 10/30/2018 12:08 10/30/2018 16:53 10/30/2018 16:56 10/30/2018 17:19 10/30/2018 17:50 10/30/2018 20:17 10/30/2018 21:04 10/31/2018 00:00 10/31/2018 02:13 10/31/2018 04:07 10/31/2018 07:36  Glucose-Capillary Latest Ref Range: 70 - 99 mg/dL 73 71 39 (LL) 37 (LL) 65 (L) 98 60 (L) 122 (H) 141 (H) 139 (H) 162 (H) 184 (H)   Review of Glycemic Control  Diabetes history: DM1 (makes NO insulin;is sensitive to insulin;requires basal, correction, and meal coverage insulin) Outpatient Diabetes medications:Lantus40units BID,Novolog 4 units TID with meals Current orders for Inpatient glycemic control: Lantus 14 units BID  Inpatient Diabetes Program Recommendations:   Insulin - Basal: Noted NO Lantus was given on 10/30/18. Patient last received Lantus 14 units at 20:59 on 10/29/18. Noted worsening renal function and tube feedings stopped around 2pm on 10/30/18 when patient self extubated.  Please consider decreasing Lantus to 10 units BID.  Correction (SSI): Please consider reordering Novolog correction 0-9 units Q4H (if diet will be resumed, please order Novolog 0-9 units TID with meals and Novolog 0-5 units QHS).  Insulin-Meal Coverage:  If diet will be resumed, please order Novolog 3 units TID with meals for meal coverage if patient eats at least 50% of meals.  IV Fluids: If diet is resumed, please re-evaluate if D10 @ 15 ml/hr is needed.  NOTE: Per chart, patient self extubated around 2pm on 10/30/18 and tube feedings were stopped at that time, patient has remained NPO since then, and worsening renal function (creatinine 4.92 on  10/31/18).  Thanks, [name], RN, MSN, CDE Diabetes Coordinator Inpatient Diabetes Program [phone] (Team Pager from 8am to 5pm)

## 2018-10-31 NOTE — Progress Notes (Signed)
[place], [place] 10/31/18  Subjective:  Patient now extubated. Currently on high flow oxygen via nasal cannula. Lethargic but arousable. Continues to have considerable acute renal failure.   Objective:  Vital signs in last 24 hours:  Temp:  [97.6 F (36.4 C)-98.6 F (37 C)] 98.2 F (36.8 C) (04/28 0800) Pulse Rate:  [97-130] 115 (04/28 0600) Resp:  [12-24] 16 (04/28 0600) BP: (122-178)/(75-113) 132/92 (04/28 0600) SpO2:  [86 %-100 %] 100 % (04/28 0600) FiO2 (%):  [30 %-74 %] 50 % (04/28 0715)  Weight change:  Filed Weights   10/27/18 0500 10/28/18 0500 10/30/18 0400  Weight: 62.5 kg 64 kg 65 kg    Intake/Output:    Intake/Output Summary (Last 24 hours) at 10/31/2018 0946 Last data filed at 10/31/2018 0510 Gross per 24 hour  Intake 702.27 ml  Output 1715 ml  Net -1012.73 ml     Physical Exam: General:  Chronically ill-appearing, thin  HEENT  anicteric, high flow Maynard in place  Neck  supple  Pulm/lungs  bilaterlal coarse breath sounds  CVS/Heart  S1S2 no rubs  Abdomen:   Soft, nontender  Extremities:  Right BKA, no edema on left  Neurologic:  lethargic but arousable  Skin:  No acute rashes    Foley in place       Basic Metabolic Panel:  Recent Labs  Lab 10/26/18 0534 10/27/18 0451  10/27/18 2153 10/28/18 0545 10/28/18 2153 10/29/18 0443 10/30/18 0539 10/31/18 0355  NA 134* 137   < > 139 140 139 141 144 145  K 3.5 3.1*   < > 3.5 3.4* 3.8 3.8 3.7 4.0  CL 104 104   < > 103 103 100 102 104 102  CO2 19* 17*   < > 21* '23 25 26 28 '$ 21*  GLUCOSE 131* 294*   < > 179* 163* 206* 199* 92 171*  BUN 72* 79*   < > 82* 83* 91* 89* 90* 93*  CREATININE 3.75* 4.28*   < > 4.66* 4.67* 4.84* 4.88* 4.91* 4.92*  CALCIUM 7.8* 7.9*   < > 7.8* 7.9* 7.5* 7.5* 7.9* 8.5*  MG 1.9 2.0  --  1.9  --   --   --  2.1  --   PHOS  --  6.8*  --  6.7*  --   --   --  7.3*  --    < > = values in this interval not displayed.     CBC: Recent Labs  Lab  10/27/18 0451  10/28/18 0545 10/28/18 2153 10/29/18 0443 10/30/18 0539 10/31/18 0355  WBC 10.5   < > 14.5* 13.2* 12.8* 11.1* 20.3*  NEUTROABS 8.9*  --  12.4*  --   --   --  18.9*  HGB 5.6*   < > 8.2* 7.8* 7.7* 7.5* 9.9*  HCT 18.7*   < > 24.5* 23.7* 24.1* 23.5* 31.6*  MCV 89.0   < > 84.2 85.3 87.3 89.0 88.8  PLT 358   < > 432* 377 386 375 580*   < > = values in this interval not displayed.     No results found for: HEPBSAG, HEPBSAB, HEPBIGM    Microbiology:  Recent Results (from the past 240 hour(s))  Culture, blood (x 2)     Status: None   Collection Time: 10/23/18  6:49 AM  Result Value Ref Range Status   Specimen Description BLOOD RIGHT ARM  Final   Special Requests   Final    BOTTLES  DRAWN AEROBIC AND ANAEROBIC Blood Culture results may not be optimal due to an excessive volume of blood received in culture bottles   Culture   Final    NO GROWTH 5 DAYS Performed at [facility], [address] [place], [place] [address]    Report Status 10/28/2018 FINAL  Final  Culture, blood (x 2)     Status: None   Collection Time: 10/23/18  6:49 AM  Result Value Ref Range Status   Specimen Description BLOOD LEFT WRIST  Final   Special Requests BOTTLES DRAWN AEROBIC AND ANAEROBIC [place]  Final   Culture   Final    NO GROWTH 5 DAYS Performed at [facility], [address] [place], [place] [address]    Report Status 10/28/2018 FINAL  Final  MRSA PCR Screening     Status: None   Collection Time: 10/23/18  7:40 AM  Result Value Ref Range Status   MRSA by PCR NEGATIVE NEGATIVE Final    Comment:        The GeneXpert MRSA Assay (FDA approved for NASAL specimens only), is one component of a comprehensive MRSA colonization surveillance program. It is not intended to diagnose MRSA infection nor to guide or monitor treatment for MRSA infections. Performed at [facility], [address] [place], [place] [address]   SARS Coronavirus 2 [facility]  order, Performed in [facility] hospital lab)     Status: None   Collection Time: 10/23/18  6:06 PM  Result Value Ref Range Status   SARS Coronavirus 2 NEGATIVE NEGATIVE Final    Comment: (NOTE) If result is NEGATIVE SARS-CoV-2 target nucleic acids are NOT DETECTED. The SARS-CoV-2 RNA is generally detectable in upper and lower  respiratory specimens during the acute phase of infection. The lowest  concentration of SARS-CoV-2 viral copies this assay can detect is 250  copies / mL. A negative result does not preclude SARS-CoV-2 infection  and should not be used as the sole basis for treatment or other  patient management decisions.  A negative result may occur with  improper specimen collection / handling, submission of specimen other  than nasopharyngeal swab, presence of viral mutation(s) within the  areas targeted by this assay, and inadequate number of viral copies  (<250 copies / mL). A negative result must be combined with clinical  observations, patient history, and epidemiological information. If result is POSITIVE SARS-CoV-2 target nucleic acids are DETECTED. The SARS-CoV-2 RNA is generally detectable in upper and lower  respiratory specimens dur ing the acute phase of infection.  Positive  results are indicative of active infection with SARS-CoV-2.  Clinical  correlation with patient history and other diagnostic information is  necessary to determine patient infection status.  Positive results do  not rule out bacterial infection or co-infection with other viruses. If result is PRESUMPTIVE POSTIVE SARS-CoV-2 nucleic acids MAY BE PRESENT.   A presumptive positive result was obtained on the submitted specimen  and confirmed on repeat testing.  While 2019 novel coronavirus  (SARS-CoV-2) nucleic acids may be present in the submitted sample  additional confirmatory testing may be necessary for epidemiological  and / or clinical management purposes  to differentiate between   SARS-CoV-2 and other Sarbecovirus currently known to infect humans.  If clinically indicated additional testing with an alternate test  methodology [phone]) is advised. The SARS-CoV-2 RNA is generally  detectable in upper and lower respiratory sp ecimens during the acute  phase of infection. The expected result is Negative.  Fact Sheet for Patients:  StrictlyIdeas.no Fact Sheet for Healthcare Providers: BankingDealers.co.za This test is not yet approved or cleared by the [place] FDA and has been authorized for detection and/or diagnosis of SARS-CoV-2 by FDA under an Emergency Use Authorization (EUA).  This EUA will remain in effect (meaning this test can be used) for the duration of the COVID-19 declaration under Section 564(b)(1) of the Act, 21 U.S.C. section 360bbb-3(b)(1), unless the authorization is terminated or revoked sooner. Performed at [facility], [address] [place], [place] [address]   Respiratory Panel by PCR     Status: None   Collection Time: 10/24/18  9:17 AM  Result Value Ref Range Status   Adenovirus NOT DETECTED NOT DETECTED Final   Coronavirus 229E NOT DETECTED NOT DETECTED Final    Comment: (NOTE) The Coronavirus on the Respiratory Panel, DOES NOT test for the novel  Coronavirus (2019 nCoV)    Coronavirus HKU1 NOT DETECTED NOT DETECTED Final   Coronavirus NL63 NOT DETECTED NOT DETECTED Final   Coronavirus OC43 NOT DETECTED NOT DETECTED Final   Metapneumovirus NOT DETECTED NOT DETECTED Final   Rhinovirus / Enterovirus NOT DETECTED NOT DETECTED Final   Influenza A NOT DETECTED NOT DETECTED Final   Influenza B NOT DETECTED NOT DETECTED Final   Parainfluenza Virus 1 NOT DETECTED NOT DETECTED Final   Parainfluenza Virus 2 NOT DETECTED NOT DETECTED Final   Parainfluenza Virus 3 NOT DETECTED NOT DETECTED Final   Parainfluenza Virus 4 NOT DETECTED NOT DETECTED Final   Respiratory Syncytial Virus  NOT DETECTED NOT DETECTED Final   Bordetella pertussis NOT DETECTED NOT DETECTED Final   Chlamydophila pneumoniae NOT DETECTED NOT DETECTED Final   Mycoplasma pneumoniae NOT DETECTED NOT DETECTED Final    Comment: Performed at [facility], [place], [place], [place] [address]  Culture, respiratory     Status: None (Preliminary result)   Collection Time: 10/29/18 12:06 PM  Result Value Ref Range Status   Specimen Description   Final    TRACHEAL ASPIRATE Performed at [facility], [address] [place], [place] [address]    Special Requests   Final    NONE Performed at [facility], [address] [place], [place] [address]    Gram Stain   Final    MODERATE WBC PRESENT,BOTH PMN AND MONONUCLEAR RARE SQUAMOUS EPITHELIAL CELLS PRESENT RARE YEAST WITH PSEUDOHYPHAE Performed at [facility], [place] [address], [place], [place] [address]    Culture FEW YEAST  Final   Report Status PENDING  Incomplete    Coagulation Studies: No results for input(s): LABPROT, INR in the last 72 hours.  Urinalysis: No results for input(s): COLORURINE, LABSPEC, PHURINE, GLUCOSEU, HGBUR, BILIRUBINUR, KETONESUR, PROTEINUR, UROBILINOGEN, NITRITE, LEUKOCYTESUR in the last 72 hours.  Invalid input(s): APPERANCEUR    Imaging: Dg Chest Port 1 View  Result Date: 10/31/2018 CLINICAL DATA:  Acute respiratory failure. EXAM: PORTABLE CHEST 1 VIEW COMPARISON:  Radiograph October 30, 2018. FINDINGS: Stable cardiomediastinal silhouette. Endotracheal and nasogastric tubes have been removed. No pneumothorax or pleural effusion is noted. Stable bilateral patchy airspace opacities are noted consistent with pneumonia, edema or ARDS. Bony thorax is unremarkable. IMPRESSION: Endotracheal and nasogastric tubes have been removed. Stable bilateral lung opacities as described above. Electronically Signed   By: Marijo Conception M.D.   On: 10/31/2018 07:14   Dg Chest Port 1 View  Result Date:  10/30/2018 CLINICAL DATA:  Acute respiratory failure. EXAM: PORTABLE CHEST  1 VIEW COMPARISON:  One-view chest x-ray 10/28/2018 FINDINGS: The heart size is normal. Patient remains intubated. The endotracheal tube terminates 4.5 cm above the carina. NG tube courses off the inferior border the film. Interstitial and airspace disease is again noted. Overall lung volumes are slightly improved. IMPRESSION: 1. Support apparatus is stable. 2. Diffuse interstitial and airspace disease with slight improvement. Findings consistent with ARDS. Electronically Signed   By: San Morelle M.D.   On: 10/30/2018 08:29     Medications:   . sodium chloride Stopped (10/30/18 1438)  . dextrose 15 mL/hr at 10/31/18 0500  . HYDROmorphone Stopped (10/30/18 1229)  . propofol (DIPRIVAN) infusion Stopped (10/30/18 1229)   . chlorhexidine gluconate (MEDLINE KIT)  15 mL Mouth Rinse BID  . epoetin (EPOGEN/PROCRIT) injection  20,000 Units Subcutaneous Weekly  . insulin glargine  14 Units Subcutaneous BID  . ipratropium-albuterol  3 mL Nebulization Q4H  . nutrition supplement (JUVEN)  1 packet Per Tube BID BM  . pantoprazole (PROTONIX) IV  40 mg Intravenous Q24H  . vitamin B-12  5,000 mcg Oral Daily  . ascorbic acid  250 mg Per Tube BID   sodium chloride, acetaminophen **OR** acetaminophen, dextrose, hydrALAZINE, HYDROmorphone, ipratropium-albuterol, LORazepam, magnesium hydroxide, morphine injection, polyethylene glycol  Assessment/ Plan:  33 y.o. 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 2D echo from December 2019 shows moderate concentric hypertrophy, EF 50%, diffuse hypokinesis, grade 1 diastolic dysfunction  1.  Acute kidney injury on chronic kidney disease stage  III Baseline creatinine of 1.8/GFR 49 from February 2020 Acute kidney injury appears to be multifactorial  ANA, ANCA are negative; complements normal -Renal function overall remains low.  BUN currently 93 with a creatinine of 4.92.  Urine output was 2 L over the preceding 24 hours.  Therefore no urgent indication for dialysis however we may need to consider this during the admission if uremia continues to worsen.  2.  Acute hypoxic respiratory failure Unclear cause but possibly volume overload versus pneumonia.   Patient is getting IV antibiotics empirically -Patient extubated 10/30/2018.  Currently on high flow nasal cannula.  3.  Diabetes type 1 with CKD Diabetes is poorly controlled Hemoglobin A1c 13.7% on September 20, 2018  4.  Anemia of chronic kidney disease and blood loss. -Hemoglobin up to 9.9.  Maintain the patient on Epogen 20,000 units subcutaneous weekly.    LOS: 13 Shawn [name] 4/28/20209:46 AM  [place], [place]  Note: This note was prepared with Dragon dictation. Any transcription errors are unintentional

## 2018-10-31 NOTE — Progress Notes (Signed)
CRITICAL CARE NOTE      CHIEF COMPLAINT:   Acute hypoxemic respiratory failure   SUBJECTIVE FINDINGS & SIGNIFICANT EVENTS   Patient remains critically ill.  Status post self extubation, on high flow nasal cannula - weaning actively -Reports diffuse pain, history of narcotic abuse, low-dose morphine with Zofran as needed -patient requests PO diet - will perform bedside eval and advance as able.   4/8 assesses by [facility] surgery-limb threatening ischemia 4/15right leg gangrene s/pright below-the-knee amputation Treated with PCA for pain, control FSBS 4/16 1 unit PRBC transfused 4/18 2 units PRBC's transfused 4/20 progressive hypoxia with acute b/l infiltrates then transferred to SD for further care and management 4/22 remains hypoxic  4/23 remains hypoxic DX TACO 4/23 severe hypoxia near cardiac arrest, intubated on VENT 4/27 - 245pm - patient self extubated 4/28 -clinically improved, depression with flat affect noted-psychiatry consultation  PAST MEDICAL HISTORY   Past Medical History:  Diagnosis Date  . CKD (chronic kidney disease)   . Diabetes mellitus without complication ([place])   . GERD (gastroesophageal reflux disease)   . HTN (hypertension)   . IBS (irritable bowel syndrome)   . Osteomyelitis [facility])      SURGICAL HISTORY   Past Surgical History:  Procedure Laterality Date  . ABDOMINAL AORTOGRAM W/LOWER EXTREMITY Right 12/23/2017   Procedure: ABDOMINAL AORTOGRAM W/LOWER EXTREMITY;  Surgeon: Katha Cabal, MD;  Location: [place] [facility];  Service: Cardiovascular;  Laterality: Right;  . ACHILLES TENDON SURGERY Bilateral 06/16/2018   Procedure: ACHILLES LENGTHENING/KIDNER;  Surgeon: Albertine Patricia, DPM;  Location: [facility];  Service: Podiatry;  Laterality: Bilateral;  . AMPUTATION  Right 12/24/2017   Procedure: AMPUTATION RAY;  Surgeon: Sharlotte Alamo, DPM;  Location: [facility];  Service: Podiatry;  Laterality: Right;  . AMPUTATION Left 04/06/2018   Procedure: AMPUTATION RAY;  Surgeon: Sharlotte Alamo, DPM;  Location: [facility];  Service: Podiatry;  Laterality: Left;  . AMPUTATION Left 04/09/2018   Procedure: AMPUTATION RAY;  Surgeon: Sharlotte Alamo, DPM;  Location: [facility];  Service: Podiatry;  Laterality: Left;  . AMPUTATION Right 10/18/2018   Procedure: AMPUTATION BELOW KNEE;  Surgeon: Katha Cabal, MD;  Location: [facility];  Service: Vascular;  Laterality: Right;  . APPLICATION OF WOUND VAC Right 12/24/2017   Procedure: APPLICATION OF WOUND VAC;  Surgeon: Sharlotte Alamo, DPM;  Location: [facility];  Service: Podiatry;  Laterality: Right;  . APPLICATION OF WOUND VAC Right 01/01/2018   Procedure: APPLICATION OF WOUND VAC;  Surgeon: Albertine Patricia, DPM;  Location: [facility];  Service: Podiatry;  Laterality: Right;  . BONE EXCISION Bilateral 06/16/2018   Procedure: BONE EXCISION AND SOFT TISSUE;  Surgeon: Albertine Patricia, DPM;  Location: [facility];  Service: Podiatry;  Laterality: Bilateral;  . IRRIGATION AND DEBRIDEMENT FOOT Right 12/20/2017   Procedure: IRRIGATION AND DEBRIDEMENT FOOT;  Surgeon: Sharlotte Alamo, DPM;  Location: [facility];  Service: Podiatry;  Laterality: Right;  . IRRIGATION AND DEBRIDEMENT FOOT Right 12/24/2017   Procedure: IRRIGATION AND DEBRIDEMENT FOOT;  Surgeon: Sharlotte Alamo, DPM;  Location: [facility];  Service: Podiatry;  Laterality: Right;  . IRRIGATION AND DEBRIDEMENT FOOT Right 01/01/2018   Procedure: IRRIGATION AND DEBRIDEMENT FOOT-SKIN,SOFT TISSUE AND BONE;  Surgeon: Albertine Patricia, DPM;  Location: [facility];  Service: Podiatry;  Laterality: Right;  . IRRIGATION AND DEBRIDEMENT FOOT Left 04/06/2018   Procedure: IRRIGATION AND DEBRIDEMENT FOOT;  Surgeon: Sharlotte Alamo, DPM;  Location: [facility];  Service: Podiatry;  Laterality: Left;  . IRRIGATION AND DEBRIDEMENT FOOT  Right  09/20/2018   Procedure: IRRIGATION AND DEBRIDEMENT FOOT;  Surgeon: Sharlotte Alamo, DPM;  Location: [facility];  Service: Podiatry;  Laterality: Right;  . LOWER EXTREMITY ANGIOGRAPHY Left 04/06/2018   Procedure: Lower Extremity Angiography;  Surgeon: Algernon Huxley, MD;  Location: [place] [facility];  Service: Cardiovascular;  Laterality: Left;  . LOWER EXTREMITY ANGIOGRAPHY Bilateral 10/13/2018   Procedure: Lower Extremity Angiography;  Surgeon: Katha Cabal, MD;  Location: [place] [facility];  Service: Cardiovascular;  Laterality: Bilateral;     FAMILY HISTORY   Family History  Problem Relation Age of Onset  . Diabetes Mother   . Ovarian cancer Mother   . Healthy Father      SOCIAL HISTORY   Social History   Tobacco Use  . Smoking status: Never Smoker  . Smokeless tobacco: Never Used  Substance Use Topics  . Alcohol use: No    Frequency: Never  . Drug use: Yes    Types: Marijuana, PCP     MEDICATIONS   Current Medication:  Current Facility-Administered Medications:  .  0.9 %  sodium chloride infusion, , Intravenous, PRN, Harrie Foreman, MD, Stopped at 10/30/18 1438 .  acetaminophen (TYLENOL) tablet 650 mg, 650 mg, Per Tube, Q6H PRN, 650 mg at 10/28/18 2028 **OR** acetaminophen (TYLENOL) suppository 650 mg, 650 mg, Rectal, Q6H PRN, Flora Lipps, MD, 650 mg at 10/26/18 1622 .  chlorhexidine gluconate (MEDLINE KIT) (PERIDEX) 0.12 % solution 15 mL, 15 mL, Mouth Rinse, BID, Darel Hong D, NP, 15 mL at 10/31/18 0941 .  dextrose 10 % infusion, , Intravenous, Continuous, Blakeney, Dreama Saa, NP, Last Rate: 15 mL/hr at 10/31/18 0500 .  dextrose 50 % solution 12.5 g, 12.5 g, Intravenous, PRN, Flora Lipps, MD, 25 g at 10/30/18 2027 .  epoetin alfa (EPOGEN) injection 20,000 Units, 20,000 Units, Subcutaneous, Weekly, Candiss Norse, [place], MD, 20,000 Units at 10/26/18 1019 .  hydrALAZINE (APRESOLINE) injection 10-20 mg, 10-20 mg, Intravenous, Q4H PRN, Awilda Bill, NP, 10 mg  at 10/30/18 2103 .  HYDROmorphone (DILAUDID) 50 mg in sodium chloride 0.9 % 100 mL (0.5 mg/mL) infusion, 0.5-4 mg/hr, Intravenous, Continuous, Flora Lipps, MD, Stopped at 10/30/18 1229 .  HYDROmorphone (DILAUDID) bolus via infusion 0.5 mg, 0.5 mg, Intravenous, Q30 min PRN, Flora Lipps, MD, 0.5 mg at 10/30/18 0821 .  insulin glargine (LANTUS) injection 14 Units, 14 Units, Subcutaneous, BID, Paticia Stack, [place], 14 Units at 10/31/18 1035 .  ipratropium-albuterol (DUONEB) 0.5-2.5 (3) MG/3ML nebulizer solution 3 mL, 3 mL, Nebulization, Q6H PRN, Blakeney, Dreama Saa, NP .  ipratropium-albuterol (DUONEB) 0.5-2.5 (3) MG/3ML nebulizer solution 3 mL, 3 mL, Nebulization, Q4H, Kasa, Kurian, MD, 3 mL at 10/31/18 0714 .  LORazepam (ATIVAN) injection 1-2 mg, 1-2 mg, Intravenous, Q4H PRN, Flora Lipps, MD, 1 mg at 10/29/18 1027 .  magnesium hydroxide (MILK OF MAGNESIA) suspension 30 mL, 30 mL, Oral, Daily PRN, Schnier, Dolores Lory, MD .  morphine 2 MG/ML injection 1-2 mg, 1-2 mg, Intravenous, Q4H PRN, Awilda Bill, NP, 2 mg at 10/31/18 1044 .  nutrition supplement (JUVEN) (JUVEN) powder packet 1 packet, 1 packet, Per Tube, BID BM, Verl Whitmore, MD .  pantoprazole (PROTONIX) injection 40 mg, 40 mg, Intravenous, Q24H, Cyndee Brightly M, RPH, 40 mg at 10/31/18 1035 .  polyethylene glycol (MIRALAX / GLYCOLAX) packet 17 g, 17 g, Oral, Daily PRN, Tukov-Yual, Magdalene S, NP .  propofol (DIPRIVAN) 1000 MG/100ML infusion, 5-80 mcg/kg/min, Intravenous, Titrated, Flora Lipps, MD, Stopped at 10/30/18 1229 .  vitamin  B-12 (CYANOCOBALAMIN) tablet 5,000 mcg, 5,000 mcg, Oral, Daily, Schnier, Dolores Lory, MD, 5,000 mcg at 10/30/18 [phone] .  vitamin C (ASCORBIC ACID) tablet 250 mg, 250 mg, Per Tube, BID, Flora Lipps, MD, 250 mg at 10/30/18 0923    ALLERGIES   Banana; Keflex [cephalexin]; Sulfa antibiotics; Grapeseed extract [nutritional supplements]; Shellfish allergy; and Grape seed    REVIEW OF SYSTEMS    10 point  ROS is conducted and negative except as per subjective findings  PHYSICAL EXAMINATION   Vitals:   10/31/18 0600 10/31/18 0800  BP: (!) 132/92   Pulse: (!) 115   Resp: 16   Temp:  98.2 F (36.8 C)  SpO2: 100%     GENERAL: Mild distress due to pain HEAD: Normocephalic, atraumatic.  EYES: Pupils equal, round, reactive to light.  No scleral icterus.  MOUTH: Moist mucosal membrane. NECK: Supple. No thyromegaly. No nodules. No JVD.  PULMONARY: Rhonchorous breath sounds bilaterally CARDIOVASCULAR: S1 and S2. Regular rate and rhythm. No murmurs, rubs, or gallops.  GASTROINTESTINAL: Soft, nontender, non-distended. No masses. Positive bowel sounds. No hepatosplenomegaly.  MUSCULOSKELETAL: No swelling, clubbing, or edema.  NEUROLOGIC: Mild distress due to acute illness SKIN:intact,warm,dry   LABS AND IMAGING     LAB RESULTS: Recent Labs  Lab 10/29/18 0443 10/30/18 0539 10/31/18 0355  NA 141 144 145  K 3.8 3.7 4.0  CL 102 104 102  CO2 26 28 21*  BUN 89* 90* 93*  CREATININE 4.88* 4.91* 4.92*  GLUCOSE 199* 92 171*   Recent Labs  Lab 10/29/18 0443 10/30/18 0539 10/31/18 0355  HGB 7.7* 7.5* 9.9*  HCT 24.1* 23.5* 31.6*  WBC 12.8* 11.1* 20.3*  PLT 386 375 580*     IMAGING RESULTS: Dg Chest Port 1 View  Result Date: 10/31/2018 CLINICAL DATA:  Acute respiratory failure. EXAM: PORTABLE CHEST 1 VIEW COMPARISON:  Radiograph October 30, 2018. FINDINGS: Stable cardiomediastinal silhouette. Endotracheal and nasogastric tubes have been removed. No pneumothorax or pleural effusion is noted. Stable bilateral patchy airspace opacities are noted consistent with pneumonia, edema or ARDS. Bony thorax is unremarkable. IMPRESSION: Endotracheal and nasogastric tubes have been removed. Stable bilateral lung opacities as described above. Electronically Signed   By: Marijo Conception M.D.   On: 10/31/2018 07:14      ASSESSMENT AND PLAN     -Multidisciplinary rounds held today  Acute  Hypoxic Respiratory Failure Moderate ARDS/TRALI - possible concomitant aspiration pneumonia - on zosyn - last day tommorow - appreciate ID input -continue Full MV support -continue Bronchodilator Therapy -Wean Fio2 and PEEP as tolerated -will perform SAT/SBT when respiratory parameters are met  Right lower extremity critical limb ischemia    S/p BKA - surgery following -  Appreciate input   Anemia - of chronic disease and acute blood loss     -s/p PRBC transfusion    - h/h monitoring   Renal Failure-diabetic nephropathy and CKD   Nephrology on case - appreciate input -follow chem 7 -follow UO -continue Foley Catheter-assess need daily    ID -continue IV abx as prescibed -follow up cultures-negative repeat  GI/Nutrition GI PROPHYLAXIS as indicated DIET-->TF's as tolerated Constipation protocol as indicated  ENDO - ICU hypoglycemic\Hyperglycemia protocol -check FSBS per protocol   ELECTROLYTES -follow labs as needed -replace as needed -pharmacy consultation   DVT/GI PRX ordered -SCDs  TRANSFUSIONS AS NEEDED MONITOR FSBS ASSESS the need for LABS as needed   Critical care provider statement:   Critical care time (minutes): 35  Critical care  time was exclusive of: Separately billable procedures and treating other patients  Critical care was necessary to treat or prevent imminent or life-threatening deterioration of the following conditions: acute hypoxemic respiratory failure, aki, anemia, bka, critical limb ischemia. DM1  Critical care was time spent personally by me on the following activities: Development of treatment plan with patient or surrogate, discussions with consultants, evaluation of patient's response to treatment, examination of patient, obtaining history from patient or surrogate, ordering and performing treatments and interventions, ordering and review of laboratory studies and re-evaluation of patient's condition.  I assumed  direction of critical care for this patient from another provider in my specialty: no   This document was prepared using Dragon voice recognition software and may include unintentional dictation errors.    Ottie Glazier, M.D.  Division of [place]

## 2018-10-31 NOTE — Evaluation (Signed)
Clinical/Bedside Swallow Evaluation Patient Details  Name: Shawn [name] MRN: [id number] Date of Birth: 1985/09/28  Today's Date: 10/31/2018 Time: SLP Start Time (ACUTE ONLY): 81 SLP Stop Time (ACUTE ONLY): 1505 SLP Time Calculation (min) (ACUTE ONLY): 50 min  Past Medical History:  Past Medical History:  Diagnosis Date  . CKD (chronic kidney disease)   . Diabetes mellitus without complication ([place])   . GERD (gastroesophageal reflux disease)   . HTN (hypertension)   . IBS (irritable bowel syndrome)   . Osteomyelitis [facility]    Past Surgical History:  Past Surgical History:  Procedure Laterality Date  . ABDOMINAL AORTOGRAM W/LOWER EXTREMITY Right 12/23/2017   Procedure: ABDOMINAL AORTOGRAM W/LOWER EXTREMITY;  Surgeon: Katha Cabal, MD;  Location: [place] [facility];  Service: Cardiovascular;  Laterality: Right;  . ACHILLES TENDON SURGERY Bilateral 06/16/2018   Procedure: ACHILLES LENGTHENING/KIDNER;  Surgeon: Albertine Patricia, DPM;  Location: [facility];  Service: Podiatry;  Laterality: Bilateral;  . AMPUTATION Right 12/24/2017   Procedure: AMPUTATION RAY;  Surgeon: Sharlotte Alamo, DPM;  Location: [facility];  Service: Podiatry;  Laterality: Right;  . AMPUTATION Left 04/06/2018   Procedure: AMPUTATION RAY;  Surgeon: Sharlotte Alamo, DPM;  Location: [facility];  Service: Podiatry;  Laterality: Left;  . AMPUTATION Left 04/09/2018   Procedure: AMPUTATION RAY;  Surgeon: Sharlotte Alamo, DPM;  Location: [facility];  Service: Podiatry;  Laterality: Left;  . AMPUTATION Right 10/18/2018   Procedure: AMPUTATION BELOW KNEE;  Surgeon: Katha Cabal, MD;  Location: [facility];  Service: Vascular;  Laterality: Right;  . APPLICATION OF WOUND VAC Right 12/24/2017   Procedure: APPLICATION OF WOUND VAC;  Surgeon: Sharlotte Alamo, DPM;  Location: [facility];  Service: Podiatry;  Laterality: Right;  . APPLICATION OF WOUND VAC Right 01/01/2018   Procedure: APPLICATION OF WOUND VAC;  Surgeon: Albertine Patricia, DPM;   Location: [facility];  Service: Podiatry;  Laterality: Right;  . BONE EXCISION Bilateral 06/16/2018   Procedure: BONE EXCISION AND SOFT TISSUE;  Surgeon: Albertine Patricia, DPM;  Location: [facility];  Service: Podiatry;  Laterality: Bilateral;  . IRRIGATION AND DEBRIDEMENT FOOT Right 12/20/2017   Procedure: IRRIGATION AND DEBRIDEMENT FOOT;  Surgeon: Sharlotte Alamo, DPM;  Location: [facility];  Service: Podiatry;  Laterality: Right;  . IRRIGATION AND DEBRIDEMENT FOOT Right 12/24/2017   Procedure: IRRIGATION AND DEBRIDEMENT FOOT;  Surgeon: Sharlotte Alamo, DPM;  Location: [facility];  Service: Podiatry;  Laterality: Right;  . IRRIGATION AND DEBRIDEMENT FOOT Right 01/01/2018   Procedure: IRRIGATION AND DEBRIDEMENT FOOT-SKIN,SOFT TISSUE AND BONE;  Surgeon: Albertine Patricia, DPM;  Location: [facility];  Service: Podiatry;  Laterality: Right;  . IRRIGATION AND DEBRIDEMENT FOOT Left 04/06/2018   Procedure: IRRIGATION AND DEBRIDEMENT FOOT;  Surgeon: Sharlotte Alamo, DPM;  Location: [facility];  Service: Podiatry;  Laterality: Left;  . IRRIGATION AND DEBRIDEMENT FOOT Right 09/20/2018   Procedure: IRRIGATION AND DEBRIDEMENT FOOT;  Surgeon: Sharlotte Alamo, DPM;  Location: [facility];  Service: Podiatry;  Laterality: Right;  . LOWER EXTREMITY ANGIOGRAPHY Left 04/06/2018   Procedure: Lower Extremity Angiography;  Surgeon: Algernon Huxley, MD;  Location: [place] [facility];  Service: Cardiovascular;  Laterality: Left;  . LOWER EXTREMITY ANGIOGRAPHY Bilateral 10/13/2018   Procedure: Lower Extremity Angiography;  Surgeon: Katha Cabal, MD;  Location: [place] [facility];  Service: Cardiovascular;  Laterality: Bilateral;   HPI:  33 year old man with surgery 10/18/2018, transfer to ICU 10/23/2018, intubated 10/27/2018, and self-extubated 10/30/2018   Assessment / Plan / Recommendation Clinical Impression  The  patient is able to crunch ice chips and swallow with no cough or other clinical indicators of aspiration.  He is able to take thin  liquid by spoon and externally controlled cup rim sips with no cough.  When given the cup he gulps and coughs vigorously.  He does not follow directions to take single small sips.  When given nectar-thick liquid in a cup, he gulps without coughing.  He is able to masticate and swallow graham crackers.  Recommend dysphagia 3 diet with nectar-thick liquid.  SLP will follow for diet tolerance and upgrade as appropriate.   SLP Visit Diagnosis: Dysphagia, pharyngeal phase (R13.13)    Aspiration Risk  Mild aspiration risk    Diet Recommendation Dysphagia 3 (Mech soft);Nectar-thick liquid   Liquid Administration via: Cup;No straw Medication Administration: Whole meds with puree Supervision: Staff to assist with self feeding Compensations: Minimize environmental distractions;Slow rate;Small sips/bites;Other (Comment)(Curb impulsive gulping) Postural Changes: Seated upright at 90 degrees;Remain upright for at least 30 minutes after po intake    Other  Recommendations Oral Care Recommendations: Oral care BID;Staff/trained caregiver to provide oral care   Follow up Recommendations Other (comment)(TBD)      Frequency and Duration            Prognosis Prognosis for Safe Diet Advancement: Good Barriers to Reach Goals: Cognitive deficits      Swallow Study   General Date of Onset: 10/27/18 HPI: 33 year old man with surgery 10/18/2018, transfer to ICU 10/23/2018, intubated 10/27/2018, and self-extubated 10/30/2018 Type of Study: Bedside Swallow Evaluation Previous Swallow Assessment: None Diet Prior to this Study: NPO Respiratory Status: Nasal cannula History of Recent Intubation: Yes Length of Intubations (days): 4 days Date extubated: 10/30/18 Behavior/Cognition: Alert;Impulsive;Doesn't follow directions;Other (Comment)(Flat affect) Oral Cavity Assessment: Within Functional Limits Oral Cavity - Dentition: Adequate natural dentition Self-Feeding Abilities: Able to feed self Patient  Positioning: Upright in bed Baseline Vocal Quality: Normal Volitional Cough: Cognitively unable to elicit Volitional Swallow: Unable to elicit    Oral/Motor/Sensory Function Overall Oral Motor/Sensory Function: Within functional limits   Ice Chips Ice chips: Within functional limits Presentation: Spoon   Thin Liquid Thin Liquid: Impaired Presentation: Spoon;Cup Pharyngeal  Phase Impairments: Cough - Immediate(With impulsive gulping)    Nectar Thick Nectar Thick Liquid: Within functional limits Presentation: Cup;Self Fed   Honey Thick     Puree Puree: Within functional limits Presentation: Spoon;Self Fed   Solid     Solid: Within functional limits Presentation: Spoon;Self Fed      Leroy Sea, MS/CCC- SLP  Lou Miner 10/31/2018,3:05 PM

## 2018-10-31 NOTE — Progress Notes (Signed)
[place] INFECTIOUS DISEASE PROGRESS NOTE Date of Admission:  10/18/2018     ID: KEAUNDRE THELIN is a 33 y.o. 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 Self extubated 4/27  ROS  Unable to obtain   Medications:  Antibiotics Given (last 72 hours)    Date/Time Action Medication Dose Rate   10/28/18 1413 New Bag/Given   piperacillin-tazobactam (ZOSYN) IVPB 3.375 g 3.375 g 12.5 mL/hr   10/28/18 2213 New Bag/Given   piperacillin-tazobactam (ZOSYN) IVPB 3.375 g 3.375 g 12.5 mL/hr   10/29/18 0948 New Bag/Given   piperacillin-tazobactam (ZOSYN) IVPB 3.375 g 3.375 g 12.5 mL/hr   10/29/18 2110 New Bag/Given   piperacillin-tazobactam (ZOSYN) IVPB 3.375 g 3.375 g 12.5 mL/hr   10/30/18 2863 New Bag/Given   piperacillin-tazobactam (ZOSYN) IVPB 3.375 g 3.375 g 12.5 mL/hr     . chlorhexidine gluconate (MEDLINE KIT)  15 mL Mouth Rinse BID  . epoetin (EPOGEN/PROCRIT) injection  20,000 Units Subcutaneous Weekly  . insulin glargine  14 Units Subcutaneous BID  . ipratropium-albuterol  3 mL Nebulization Q4H  . nutrition supplement (JUVEN)  1 packet Per Tube BID BM  . pantoprazole (PROTONIX) IV  40 mg Intravenous Q24H  . vitamin B-12  5,000 mcg Oral Daily  . ascorbic acid  250 mg Per Tube BID    Objective: Vital signs in last 24 hours: Temp:  [97.6 F (36.4 C)-98.6 F (37 C)] 98.2 F (36.8 C) (04/28 0800) Pulse Rate:  [112-130] 115 (04/28 0600) Resp:  [12-24] 16 (04/28 0600) BP: (128-178)/(75-113) 132/92 (04/28 0600) SpO2:  [86 %-100 %] 100 % (04/28 0600) FiO2 (%):  [30 %-74 %] 50 % (04/28 0715) General chronically ill appearing Lungs: b/l air entry Heart: tachycardia Abdomen: Soft, non-tender,not distended. Bowel sounds normal. No masses Extremities: rt BKA -surgical site clean Skin: No rashes or lesions. Or bruising Lymph:  Cervical, supraclavicular normal. Neurologic: Grossly non-focal Lab Results Recent Labs    10/30/18 0539 10/31/18 0355  WBC 11.1* 20.3*  HGB 7.5* 9.9*  HCT 23.5* 31.6*  NA 144 145  K 3.7 4.0  CL 104 102  CO2 28 21*  BUN 90* 93*  CREATININE 4.91* 4.92*    Microbiology: Results for orders placed or performed during the hospital encounter of 10/18/18  Culture, blood (x 2)     Status: None   Collection Time: 10/23/18  6:49 AM  Result Value Ref Range Status   Specimen Description BLOOD RIGHT ARM  Final   Special Requests   Final    BOTTLES DRAWN AEROBIC AND ANAEROBIC Blood Culture results may not be optimal due to an excessive volume of blood received in culture bottles   Culture   Final    NO GROWTH 5 DAYS Performed at [facility], [address], [place], [place] [address]    Report Status 10/28/2018 FINAL  Final  Culture, blood (x 2)     Status: None   Collection Time: 10/23/18  6:49 AM  Result Value Ref Range Status   Specimen Description BLOOD LEFT WRIST  Final   Special Requests BOTTLES DRAWN AEROBIC AND ANAEROBIC [place]  Final   Culture   Final    NO GROWTH 5 DAYS Performed at [facility], [address], [place], [place] [address]    Report Status 10/28/2018 FINAL  Final  MRSA PCR Screening  Status: None   Collection Time: 10/23/18  7:40 AM  Result Value Ref Range Status   MRSA by PCR NEGATIVE NEGATIVE Final    Comment:        The GeneXpert MRSA Assay (FDA approved for NASAL specimens only), is one component of a comprehensive MRSA colonization surveillance program. It is not intended to diagnose MRSA infection nor to guide or monitor treatment for MRSA infections. Performed at [facility], [address] [place], [place] [address]   SARS Coronavirus 2 [facility] order, Performed in [facility] hospital lab)     Status: None   Collection Time: 10/23/18  6:06 PM  Result Value Ref Range Status   SARS Coronavirus 2 NEGATIVE  NEGATIVE Final    Comment: (NOTE) If result is NEGATIVE SARS-CoV-2 target nucleic acids are NOT DETECTED. The SARS-CoV-2 RNA is generally detectable in upper and lower  respiratory specimens during the acute phase of infection. The lowest  concentration of SARS-CoV-2 viral copies this assay can detect is 250  copies / mL. A negative result does not preclude SARS-CoV-2 infection  and should not be used as the sole basis for treatment or other  patient management decisions.  A negative result may occur with  improper specimen collection / handling, submission of specimen other  than nasopharyngeal swab, presence of viral mutation(s) within the  areas targeted by this assay, and inadequate number of viral copies  (<250 copies / mL). A negative result must be combined with clinical  observations, patient history, and epidemiological information. If result is POSITIVE SARS-CoV-2 target nucleic acids are DETECTED. The SARS-CoV-2 RNA is generally detectable in upper and lower  respiratory specimens dur ing the acute phase of infection.  Positive  results are indicative of active infection with SARS-CoV-2.  Clinical  correlation with patient history and other diagnostic information is  necessary to determine patient infection status.  Positive results do  not rule out bacterial infection or co-infection with other viruses. If result is PRESUMPTIVE POSTIVE SARS-CoV-2 nucleic acids MAY BE PRESENT.   A presumptive positive result was obtained on the submitted specimen  and confirmed on repeat testing.  While 2019 novel coronavirus  (SARS-CoV-2) nucleic acids may be present in the submitted sample  additional confirmatory testing may be necessary for epidemiological  and / or clinical management purposes  to differentiate between  SARS-CoV-2 and other Sarbecovirus currently known to infect humans.  If clinically indicated additional testing with an alternate test  methodology [phone]) is  advised. The SARS-CoV-2 RNA is generally  detectable in upper and lower respiratory sp ecimens during the acute  phase of infection. The expected result is Negative. Fact Sheet for Patients:  StrictlyIdeas.no Fact Sheet for Healthcare Providers: BankingDealers.co.za This test is not yet approved or cleared by the [place] FDA and has been authorized for detection and/or diagnosis of SARS-CoV-2 by FDA under an Emergency Use Authorization (EUA).  This EUA will remain in effect (meaning this test can be used) for the duration of the COVID-19 declaration under Section 564(b)(1) of the Act, 21 U.S.C. section 360bbb-3(b)(1), unless the authorization is terminated or revoked sooner. Performed at [facility], [address] [place], [place] [address]   Respiratory Panel by PCR     Status: None   Collection Time: 10/24/18  9:17 AM  Result Value Ref Range Status   Adenovirus NOT DETECTED NOT DETECTED Final   Coronavirus 229E NOT DETECTED NOT DETECTED Final    Comment: (NOTE) The Coronavirus on  the Respiratory Panel, DOES NOT test for the novel  Coronavirus (2019 nCoV)    Coronavirus HKU1 NOT DETECTED NOT DETECTED Final   Coronavirus NL63 NOT DETECTED NOT DETECTED Final   Coronavirus OC43 NOT DETECTED NOT DETECTED Final   Metapneumovirus NOT DETECTED NOT DETECTED Final   Rhinovirus / Enterovirus NOT DETECTED NOT DETECTED Final   Influenza A NOT DETECTED NOT DETECTED Final   Influenza B NOT DETECTED NOT DETECTED Final   Parainfluenza Virus 1 NOT DETECTED NOT DETECTED Final   Parainfluenza Virus 2 NOT DETECTED NOT DETECTED Final   Parainfluenza Virus 3 NOT DETECTED NOT DETECTED Final   Parainfluenza Virus 4 NOT DETECTED NOT DETECTED Final   Respiratory Syncytial Virus NOT DETECTED NOT DETECTED Final   Bordetella pertussis NOT DETECTED NOT DETECTED Final   Chlamydophila pneumoniae NOT DETECTED NOT DETECTED Final   Mycoplasma  pneumoniae NOT DETECTED NOT DETECTED Final    Comment: Performed at [facility], [place] [address] [place], [place] [address]  Culture, respiratory     Status: None (Preliminary result)   Collection Time: 10/29/18 12:06 PM  Result Value Ref Range Status   Specimen Description   Final    TRACHEAL ASPIRATE Performed at [facility], [address], [place], [place] [address]    Special Requests   Final    NONE Performed at [facility], [address] [place], [place] [address]    Gram Stain   Final    MODERATE WBC PRESENT,BOTH PMN AND MONONUCLEAR RARE SQUAMOUS EPITHELIAL CELLS PRESENT RARE YEAST WITH PSEUDOHYPHAE Performed at [facility], [place] [address], [place], [place] [address]    Culture FEW YEAST  Final   Report Status PENDING  Incomplete    Studies/Results: Dg Chest Port 1 View  Result Date: 10/31/2018 CLINICAL DATA:  Acute respiratory failure. EXAM: PORTABLE CHEST 1 VIEW COMPARISON:  Radiograph October 30, 2018. FINDINGS: Stable cardiomediastinal silhouette. Endotracheal and nasogastric tubes have been removed. No pneumothorax or pleural effusion is noted. Stable bilateral patchy airspace opacities are noted consistent with pneumonia, edema or ARDS. Bony thorax is unremarkable. IMPRESSION: Endotracheal and nasogastric tubes have been removed. Stable bilateral lung opacities as described above. Electronically Signed   By: Marijo Conception M.D.   On: 10/31/2018 07:14   Dg Chest Port 1 View  Result Date: 10/30/2018 CLINICAL DATA:  Acute respiratory failure. EXAM: PORTABLE CHEST 1 VIEW COMPARISON:  One-view chest x-ray 10/28/2018 FINDINGS: The heart size is normal. Patient remains intubated. The endotracheal tube terminates 4.5 cm above the carina. NG tube courses off the inferior border the film. Interstitial and airspace disease is again noted. Overall lung volumes are slightly improved. IMPRESSION: 1. Support apparatus is stable. 2. Diffuse  interstitial and airspace disease with slight improvement. Findings consistent with ARDS. Electronically Signed   By: San Morelle M.D.   On: 10/30/2018 08:29    Assessment/Plan: 33 y.o.malewith ahistory ofIDDM, CKD, B/L Diabetic foot infection , osteomyelitis s/p amputation of toes,was admitted fro Rt BKA and had it on 4/15.  Course complicated by Acute hypoxic resp failure.   Acute hypoxia with b/l worsening infiltrate/leucocytosis Unclear etiology but appears to be ARDS from aspiration PNA- covid, resp pcr panel neg, trach asp cx negative.  Day 9 of zosyn - if trach cx remains negative tomorrow can dc zosyn   Leucocytosis and fever resolved- but now wbc trending up  no obvious infectious source  BKA - seen by vascular surgery 4/28 - site healthy  AKI on CKD- worsening cr following with Dr Holley Raring  DM-poorly controlled on insulin Thank you very much for the consult. Will follow with you.  Leonel Ramsay   10/31/2018, 11:49 AM

## 2018-11-01 ENCOUNTER — Inpatient Hospital Stay: Payer: Medicaid Other

## 2018-11-01 DIAGNOSIS — I70269 Atherosclerosis of native arteries of extremities with gangrene, unspecified extremity: Secondary | ICD-10-CM

## 2018-11-01 DIAGNOSIS — F339 Major depressive disorder, recurrent, unspecified: Secondary | ICD-10-CM

## 2018-11-01 DIAGNOSIS — F332 Major depressive disorder, recurrent severe without psychotic features: Secondary | ICD-10-CM

## 2018-11-01 LAB — CBC
HCT: 26.8 % — ABNORMAL LOW (ref 39.0–52.0)
Hemoglobin: 8.1 g/dL — ABNORMAL LOW (ref 13.0–17.0)
MCH: 27.7 pg (ref 26.0–34.0)
MCHC: 30.2 g/dL (ref 30.0–36.0)
MCV: 91.8 fL (ref 80.0–100.0)
Platelets: 640 10*3/uL — ABNORMAL HIGH (ref 150–400)
RBC: 2.92 MIL/uL — ABNORMAL LOW (ref 4.22–5.81)
RDW: 15.6 % — ABNORMAL HIGH (ref 11.5–15.5)
WBC: 18.8 10*3/uL — ABNORMAL HIGH (ref 4.0–10.5)
nRBC: 0 % (ref 0.0–0.2)

## 2018-11-01 LAB — BASIC METABOLIC PANEL
Anion gap: 22 — ABNORMAL HIGH (ref 5–15)
Anion gap: 17 — ABNORMAL HIGH (ref 5–15)
BUN: 107 mg/dL — ABNORMAL HIGH (ref 6–20)
BUN: 100 mg/dL — ABNORMAL HIGH (ref 6–20)
CO2: 21 mmol/L — ABNORMAL LOW (ref 22–32)
CO2: 25 mmol/L (ref 22–32)
Calcium: 8.5 mg/dL — ABNORMAL LOW (ref 8.9–10.3)
Calcium: 8.3 mg/dL — ABNORMAL LOW (ref 8.9–10.3)
Chloride: 105 mmol/L (ref 98–111)
Chloride: 104 mmol/L (ref 98–111)
Creatinine, Ser: 5.26 mg/dL — ABNORMAL HIGH (ref 0.61–1.24)
Creatinine, Ser: 4.94 mg/dL — ABNORMAL HIGH (ref 0.61–1.24)
GFR calc Af Amer: 16 mL/min — ABNORMAL LOW (ref >60)
GFR calc Af Amer: 17 mL/min — ABNORMAL LOW (ref >60)
GFR calc non Af Amer: 13 mL/min — ABNORMAL LOW (ref >60)
GFR calc non Af Amer: 14 mL/min — ABNORMAL LOW (ref >60)
Glucose, Bld: 235 mg/dL — ABNORMAL HIGH (ref 70–99)
Glucose, Bld: 272 mg/dL — ABNORMAL HIGH (ref 70–99)
Potassium: 3.4 mmol/L — ABNORMAL LOW (ref 3.5–5.1)
Potassium: 3.3 mmol/L — ABNORMAL LOW (ref 3.5–5.1)
Sodium: 148 mmol/L — ABNORMAL HIGH (ref 135–145)
Sodium: 146 mmol/L — ABNORMAL HIGH (ref 135–145)

## 2018-11-01 LAB — CULTURE, RESPIRATORY

## 2018-11-01 LAB — KETONES, URINE: Ketones, ur: 20 mg/dL — AB

## 2018-11-01 LAB — CULTURE, RESPIRATORY W GRAM STAIN

## 2018-11-01 LAB — GLUCOSE, CAPILLARY
Glucose-Capillary: 217 mg/dL — ABNORMAL HIGH (ref 70–99)
Glucose-Capillary: 241 mg/dL — ABNORMAL HIGH (ref 70–99)
Glucose-Capillary: 317 mg/dL — ABNORMAL HIGH (ref 70–99)
Glucose-Capillary: 259 mg/dL — ABNORMAL HIGH (ref 70–99)

## 2018-11-01 MED ORDER — FLUOXETINE HCL 20 MG PO CAPS
40.0000 mg | ORAL_CAPSULE | Freq: Every day | ORAL | Status: DC
  Administered 2018-11-01: 15:00:00 40 mg via ORAL
  Filled 2018-11-01: qty 2

## 2018-11-01 MED ORDER — SERTRALINE HCL 50 MG PO TABS: 100.0000 mg | ORAL_TABLET | Freq: Every day | ORAL | Status: DC

## 2018-11-01 MED ORDER — POTASSIUM CHLORIDE CRYS ER 20 MEQ PO TBCR
20.0000 meq | EXTENDED_RELEASE_TABLET | Freq: Once | ORAL | Status: AC
  Administered 2018-11-01: 17:00:00 20 meq via ORAL
  Filled 2018-11-01: qty 1

## 2018-11-01 MED ORDER — NEPRO/CARBSTEADY PO LIQD: 237.0000 mL | Freq: Three times a day (TID) | ORAL | Status: DC

## 2018-11-01 MED ORDER — VITAMIN C 500 MG PO TABS: 250.0000 mg | ORAL_TABLET | Freq: Two times a day (BID) | ORAL | Status: DC

## 2018-11-01 MED ORDER — PANTOPRAZOLE SODIUM 40 MG PO TBEC: 40.0000 mg | DELAYED_RELEASE_TABLET | Freq: Every day | ORAL | Status: DC

## 2018-11-01 MED ORDER — RESOURCE THICKENUP CLEAR PO POWD
ORAL | Status: DC | PRN
  Filled 2018-11-01: qty 125

## 2018-11-01 MED ORDER — HEPARIN SODIUM (PORCINE) 5000 UNIT/ML IJ SOLN
5000.0000 [IU] | Freq: Three times a day (TID) | INTRAMUSCULAR | Status: DC
  Administered 2018-11-01: 5000 [IU] via SUBCUTANEOUS
  Filled 2018-11-01: qty 1

## 2018-11-01 MED ORDER — OCUVITE-LUTEIN PO CAPS: 1.0000 | ORAL_CAPSULE | Freq: Every day | ORAL | Status: DC

## 2018-11-01 MED ORDER — INSULIN ASPART 100 UNIT/ML ~~LOC~~ SOLN
4.0000 [IU] | Freq: Three times a day (TID) | SUBCUTANEOUS | Status: DC
  Administered 2018-11-01: 4 [IU] via SUBCUTANEOUS

## 2018-11-01 MED ORDER — LACTATED RINGERS IV SOLN
INTRAVENOUS | Status: DC
  Administered 2018-11-01: 12:00:00 via INTRAVENOUS

## 2018-11-01 MED ORDER — JUVEN PO PACK: 1.0000 | PACK | Freq: Two times a day (BID) | ORAL | Status: DC

## 2018-11-01 MED ORDER — INSULIN ASPART 100 UNIT/ML ~~LOC~~ SOLN
0.0000 [IU] | Freq: Three times a day (TID) | SUBCUTANEOUS | Status: DC
  Administered 2018-11-01: 17:00:00 5 [IU] via SUBCUTANEOUS
  Filled 2018-11-01: qty 1

## 2018-11-01 NOTE — Progress Notes (Signed)
[place] [facility] [place] NAME: Shawn [name]    MR#:  [id number]  DATE OF BIRTH:  1986/07/04  SUBJECTIVE:  CHIEF COMPLAINT:  No chief complaint on file.  Self Extubated on 10/30/2018 Eating well and asking for extra servings. Pain well controlled. Afebrile  REVIEW OF SYSTEMS:    Review of Systems  Constitutional: Positive for malaise/fatigue. Negative for chills and fever.  HENT: Negative for sore throat.   Eyes: Negative for blurred vision, double vision and pain.  Respiratory: Positive for cough. Negative for hemoptysis, shortness of breath and wheezing.   Cardiovascular: Negative for chest pain, palpitations, orthopnea and leg swelling.  Gastrointestinal: Negative for abdominal pain, constipation, diarrhea, heartburn, nausea and vomiting.  Genitourinary: Negative for dysuria and hematuria.  Musculoskeletal: Positive for joint pain. Negative for back pain.  Skin: Negative for rash.  Neurological: Negative for sensory change, speech change, focal weakness and headaches.  Endo/Heme/Allergies: Does not bruise/bleed easily.  Psychiatric/Behavioral: Negative for depression. The patient is not nervous/anxious.     DRUG ALLERGIES:   Allergies  Allergen Reactions  . Banana Hives, Nausea And Vomiting and Rash  . Keflex [Cephalexin] Rash    No swelling- also taken penicillin without any issue.  . Sulfa Antibiotics Anaphylaxis  . Grapeseed Extract [Nutritional Supplements] Itching  . Shellfish Allergy Hives    "ALL SEAFOOD"  . Grape Seed Rash    VITALS:  Blood pressure 128/65, pulse (!) 112, temperature 98.2 F (36.8 C), temperature source Oral, resp. rate 17, height 5\' 6"  (1.676 m), weight 60 kg, SpO2 98 %.  PHYSICAL EXAMINATION:   Physical Exam  GENERAL:  33 y.o.-year-old patient lying in the bed .  Looks critically ill EYES: Pupils equal, round, reactive to light and accommodation. No scleral icterus. Extraocular muscles intact.   HEENT: Head atraumatic, normocephalic. Oropharynx and nasopharynx clear.  NECK:  Supple, no jugular venous distention. No thyroid enlargement, no tenderness.  LUNGS: Normal breath sounds bilaterally, no wheezing, rales, rhonchi. No use of accessory muscles of respiration.  CARDIOVASCULAR: S1, S2 normal. No murmurs, rubs, or gallops.  ABDOMEN: Soft, nontender, nondistended. Bowel sounds present. No organomegaly or mass.  EXTREMITIES: No cyanosis.  Right BKA dressing NEUROLOGIC: Moves all 4 extremities and following instructions PSYCHIATRIC: The patient is alert and awake SKIN: No obvious rash, lesion, or ulcer.   LABORATORY PANEL:   CBC Recent Labs  Lab 11/01/18 0421  WBC 18.8*  HGB 8.1*  HCT 26.8*  PLT 640*   ------------------------------------------------------------------------------------------------------------------ Chemistries  Recent Labs  Lab 10/29/18 0443 10/30/18 0539  11/01/18 0421  NA 141 144   < > 148*  K 3.8 3.7   < > 3.4*  CL 102 104   < > 105  CO2 26 28   < > 21*  GLUCOSE 199* 92   < > 235*  BUN 89* 90*   < > 107*  CREATININE 4.88* 4.91*   < > 5.26*  CALCIUM 7.5* 7.9*   < > 8.5*  MG  --  2.1  --   --   AST 24  --   --   --   ALT 18  --   --   --   ALKPHOS 218*  --   --   --   BILITOT 0.5  --   --   --    < > = values in this interval not displayed.   ------------------------------------------------------------------------------------------------------------------  Cardiac Enzymes No results for input(s): TROPONINI in the  last 168 hours. ------------------------------------------------------------------------------------------------------------------  RADIOLOGY:  Dg Chest Port 1 View  Result Date: 10/31/2018 CLINICAL DATA:  Acute respiratory failure. EXAM: PORTABLE CHEST 1 VIEW COMPARISON:  Radiograph October 30, 2018. FINDINGS: Stable cardiomediastinal silhouette. Endotracheal and nasogastric tubes have been removed. No pneumothorax or pleural  effusion is noted. Stable bilateral patchy airspace opacities are noted consistent with pneumonia, edema or ARDS. Bony thorax is unremarkable. IMPRESSION: Endotracheal and nasogastric tubes have been removed. Stable bilateral lung opacities as described above. Electronically Signed   By: Marijo Conception M.D.   On: 10/31/2018 07:14     ASSESSMENT AND PLAN:   * Severe hypoxic respiratory failure secondary to acute pulmonary edema, aspiration pneumonia with ARDS.  Self extubated 10/30/2018, now on high flow nasal cannula, appears to be tolerating well.    * Aspiration pneumonia Zosyn  * postoperative anemia.  Transfuse as needed.  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  Patient had right leg gangrene which was the reason for amputation.    * uncontrolled type 2 diabetes mellitus labile regimen being adjusted regimen based on her blood sugars, continue low-dose Lantus  * Acute chronic kidney disease stage III.  Nephrology following continues to have renal failure creatinine up to 4.92.  * Diabetic neuropathy-Neurontin at home  * Metabolic acidosis, received bicarb drip, acidosis is corrected.  All the records are reviewed and case discussed with Care Management/Social Worker Management plans discussed with the patient, family and they are in agreement.  CODE STATUS: FULL CODE  DVT Prophylaxis: SCDs  TOTAL TIME TAKING CARE OF THIS PATIENT: 35 minutes.   POSSIBLE D/C IN 3-4 DAYS, DEPENDING ON CLINICAL CONDITION.  Shawn [name] M.D on 11/01/2018 at 10:24 AM  Between 7am to 6pm - Pager - [phone]  After 6pm go to [email or web address] - password [email or web address] [facility]  Office  [phone]  CC: Primary care physician; Valera Castle, MD  Note: This dictation was prepared with Dragon dictation along with smaller phrase technology. Any transcriptional errors that result from this process are unintentional.

## 2018-11-01 NOTE — Evaluation (Signed)
Physical Therapy Re-Evaluation Patient Details Name: Shawn [name] MRN: [id number] DOB: 11/30/1985 Today's Date: 11/01/2018   History of Present Illness  33 y/o male with tortuous history of limb salvage/amputation b/l now with R BKA 4/15.  10/23/18 pt with progressive hypoxia with acute B infiltrates and transferred to ICU.  Pt intubated 4/23 and self-extubated 4/27.  New PT consult received for re-evaluation.  Clinical Impression  New PT consult received for PT re-evaluation.  Nurse cleared pt for participation in PT.  Pt appearing with flat affect initially but agreeable to PT session.  Pt reporting pain to touch of R knee but did well when actively moving R LE residual limb on own.  Pt minimal assist with logrolling L and R in bed.  Discussed sitting on edge of bed but pt refused.  Pt overall appearing overwhelmed with bed level assessment activities and appeared to be fighting back tears when therapist discussed attempting sitting on edge of bed so deferred further assessment.  Psych consult noted.  Pt on HFNC currently.  Pt would benefit from skilled PT to address noted impairments and functional limitations (see below for any additional details).  Upon hospital discharge, recommend pt discharge to [facility].  Will currently set pt's frequency at minimum of 2x's per week (d/t current respiratory/medical status and also consideration of current level of participation) but will monitor pt's status and increased to QD if/when appropriate.  PT POC/goals reviewed and updated as appropriate.    Follow Up Recommendations SNF    Equipment Recommendations  Rolling walker with 5" wheels;Wheelchair (measurements PT);Wheelchair cushion (measurements PT);3in1 (PT);Hospital bed    Recommendations for Other Services OT consult     Precautions / Restrictions Precautions Precautions: Fall Restrictions Weight Bearing Restrictions: Yes RLE Weight Bearing: Non weight bearing Other Position/Activity  Restrictions: s/p R BKA      Mobility  Bed Mobility Overal bed mobility: Needs Assistance Bed Mobility: Rolling Rolling: Min assist         General bed mobility comments: minimal assist logrolling R and L in bed; vc's for reaching to bedrail to assist; pt declined sitting on edge of bed/any OOB mobility  Transfers                 General transfer comment: pt declined sitting on edge of bed/any OOB mobility  Ambulation/Gait             General Gait Details: pt declined sitting on edge of bed/any OOB mobility  Stairs            Wheelchair Mobility    Modified Rankin (Stroke Patients Only)       Balance                                             Pertinent Vitals/Pain Pain Assessment: 0-10 Pain Score: 10-Worst pain ever Pain Location: groin((nurse present and reports d/t skin irritation)) Pain Descriptors / Indicators: Sore;Tender Pain Intervention(s): Limited activity within patient's tolerance;Monitored during session;Repositioned;Other (comment)(Nurse present and aware)     Home Living Family/patient expects to be discharged to:: Private residence Living Arrangements: Parent;Children;Other relatives(father, grandmother, and 12 y.o. son) Available Help at Discharge: Family Type of Home: House Home Access: Stairs to enter Entrance Stairs-Rails: None Entrance Stairs-Number of Steps: 1 plus 1 step Home Layout: One level   Additional Comments: Per recent OT eval "Pt has 1+1 step to  enter home, uses w/c much of the time, 1 floor home with single step down to some rooms.  Bathroom next to his room with no stair requirements"    Prior Function Level of Independence: Independent with assistive device(s)         Comments: Per recent OT eval "Pt has been cooking meals for his grandma and son (tries to sit on stool in kitchen and son brings him materials).  Tends to stay mostly in bed but will ambulate short distances in home (gets  fatigued quickly at times) with walker.  Pt reports he struggles to use w/c in home (does not fit considering his grandma is already in a w/c in the home and it is a small home per pt report)"     Hand Dominance        Extremity/Trunk Assessment   Upper Extremity Assessment Upper Extremity Assessment: Generalized weakness    Lower Extremity Assessment Lower Extremity Assessment: RLE deficits/detail;LLE deficits/detail RLE Deficits / Details: R BKA; R knee extension to neutral; R knee flexion grossly 90 degrees AROM (limited d/t surgical site pain); at least 3/5 hip flexion and knee flexion/extension ROM; good R quad set LLE Deficits / Details: at least 3/5 hip flexion and knee flexion/extension AROM; L DF AROM 1/5; L PF AROM 2-/5; L ankle ROM WFL    Cervical / Trunk Assessment Cervical / Trunk Assessment: Normal  Communication   Communication: No difficulties  Cognition Arousal/Alertness: Awake/alert Behavior During Therapy: (Initially flat but then pt appearing overwhelmed and to be fighting back tears) Overall Cognitive Status: Within Functional Limits for tasks assessed                                        General Comments   Nursing cleared pt for participation in physical therapy.  Pt agreeable to PT session.    Exercises     Assessment/Plan    PT Assessment Patient needs continued PT services  PT Problem List Decreased strength;Decreased range of motion;Decreased activity tolerance;Decreased balance;Decreased mobility;Decreased knowledge of use of DME;Decreased knowledge of precautions;Cardiopulmonary status limiting activity;Pain;Decreased skin integrity       PT Treatment Interventions DME instruction;Gait training;Stair training;Functional mobility training;Therapeutic activities;Therapeutic exercise;Balance training;Patient/family education    PT Goals (Current goals can be found in the Care Plan section)  Acute Rehab PT Goals Patient Stated  Goal: to improve strength and mobility PT Goal Formulation: With patient Time For Goal Achievement: 11/15/18 Potential to Achieve Goals: Fair    Frequency Min 2X/week   Barriers to discharge Decreased caregiver support      Co-evaluation               AM-PAC PT "6 Clicks" Mobility  Outcome Measure Help needed turning from your back to your side while in a flat bed without using bedrails?: A Little Help needed moving from lying on your back to sitting on the side of a flat bed without using bedrails?: A Lot Help needed moving to and from a bed to a chair (including a wheelchair)?: A Lot Help needed standing up from a chair using your arms (e.g., wheelchair or bedside chair)?: A Lot Help needed to walk in hospital room?: Total Help needed climbing 3-5 steps with a railing? : Total 6 Click Score: 11    End of Session Equipment Utilized During Treatment: Oxygen(HFNC) Activity Tolerance: Patient limited by pain;Other (comment)(Limited d/t nausea) Patient  left: in bed;with call bell/phone within reach;Other (comment)(L heel floating via pillow; R residual limb elevated on pillow) Nurse Communication: Mobility status;Other (comment);Precautions(Pt's pain status; pt's reports of nausea; pt's behavior during session) PT Visit Diagnosis: Muscle weakness (generalized) (M62.81);Difficulty in walking, not elsewhere classified (R26.2);Other abnormalities of gait and mobility (R26.89);Pain Pain - Right/Left: Right Pain - part of body: Leg    Time: 1136-1150 PT Time Calculation (min) (ACUTE ONLY): 14 min   Charges:   PT Evaluation $PT Re-evaluation: 1 Re-eval         Leitha Bleak, PT 11/01/18, 12:20 PM [phone]

## 2018-11-01 NOTE — Progress Notes (Signed)
Inpatient Diabetes Program Recommendations  AACE/ADA: New Consensus Statement on Inpatient Glycemic Control   Target Ranges:  Prepandial:   less than 140 mg/dL      Peak postprandial:   less than 180 mg/dL (1-2 hours)      Critically ill patients:  140 - 180 mg/dL   Results for DENNIE, MOLTZ (MRN [id number]) as of 11/01/2018 07:32  Ref. Range 10/31/2018 07:36 10/31/2018 11:19 10/31/2018 16:35 10/31/2018 19:22 10/31/2018 23:45 11/01/2018 04:26  Glucose-Capillary Latest Ref Range: 70 - 99 mg/dL 184 (H) 204 (H) 235 (H) 271 (H) 325 (H) 217 (H)   Review of Glycemic Control  Diabetes history:DM1 (makes NO insulin;is sensitive to insulin;requires basal, correction, and meal coverage insulin) Outpatient Diabetes medications:Lantus40units BID,Novolog 4 units TID with meals Current orders for Inpatient glycemic control:Lantus 14 units BID, Novolog 0-15 units TID with meals, Novolog 0-5 units QHS  Inpatient Diabetes Program Recommendations:    Correction (SSI): Please consider decreasing Novolog correction 0-9 units TID with meals.  Insulin-Meal Coverage:Since patient has DM1 and makes NO insulin, he will need insulin to cover carbohydrates consumed.  Please order Novolog 4 units TID with meals for meal coverage if patient eats at least 50% of meals.  Thanks, [name], RN, MSN, CDE Diabetes Coordinator Inpatient Diabetes Program [phone] (Team Pager from 8am to 5pm)

## 2018-11-01 NOTE — Progress Notes (Signed)
SLP Cancellation Note  Patient Details Name: Shawn [name] MRN: [id number] DOB: Mar 07, 1986   Cancelled treatment:       Reason Eval/Treat Not Completed: (chart reviewed; consulted NSG and RT re: pt's status today). Pt continues on increased O2 support w/ decline in CXR noted by RT/NP today. NSG reported pt continues to be impulsive when drinking Nectar liquids.  ST services will hold on trials to upgrade liquids in diet d/t the increased risk for aspiration and Pulmonary decline secondary to pt's current presentation. NSG reported toleration of the mech soft foods in the diet.  ST services will continue to follow pt's status for appropriateness to upgrade diet next 2-3 days. NSG and NP agreed.     Orinda Kenner, MS, CCC-SLP Watson,Katherine 11/01/2018, 5:01 PM

## 2018-11-01 NOTE — Progress Notes (Signed)
Pt is wanting to go home against medical advice, states he is tired of being in the hospital and that he is going home.  Pt is alert and oriented x4, with no focal deficits, speech clear, pupils PERRLA 3 mm bilaterally.  He has NO suicidal ideation and is NOT a threat to himself or others.  Have discussed with pt at bedside that from a Medical standpoint he is not ready for discharge given his multiple medical comorbidities, and that for him to leave he would have to leave against medical advice.  Discussed with him that he will likely deteriorate without his current medical treatment, and that choosing to leave may even end with DEATH.  Pt states he understands that risk, and he is willing to take it and go home.  He has signed [place] paperwork.  Pt did have fall at approximately 1800 this evening, but pt reports he did not hit his head.  No signs of trama, NEURO exam is normal, and vital signs stable.  Have attempted to get pt to at least let us obtain CT Head before he leaves, but he is very adamant and refusing to have Head CT done before leaving AMA.  He has called his sister to take him home.  Have discussed with his sister that pt wishes to leave AMA and that legally we are unable to keep him against his will since he is coherent and has no suicidal ideation.  Have also discussed with his nurse and charge nurse.    Darel Hong, AGACNP-BC [facility] Pager: [phone] Cell: [phone]

## 2018-11-01 NOTE — Progress Notes (Signed)
Pt yelled out for help. Upon staff checking on pt, pt was found on the floor with his back leaning against the side of the bed. There was a large amount of blood on the floor next to the pt. When assessing pt the blood was coming from his IV that was pulled out and from his stump. Pts [place] was off and not able to reach the pt. Pt told us that he was trying to reach his pillow and he fell out of the bed. Pt was starting to get SOB, subsequently he was lifted back into the bed and  placed back on pt. Pt cleaned up, a small amount of blood was coming from his stump and a large amount of blood came from the IV. Bleeding from both areas stopped with pressure and gauze dressing in place. Pt says he did not hit his head and he not having any new pain. Pt is tearful and he was reassured, will continue to monitor.

## 2018-11-01 NOTE — Progress Notes (Signed)
CRITICAL CARE NOTE         SUBJECTIVE FINDINGS & SIGNIFICANT EVENTS    No significant events overnight, weaning high flow nasal cannula.   4/8 assesses by [facility] surgery-limb threatening ischemia 4/15right leg gangrene s/pright below-the-knee amputation Treated with PCA for pain, control FSBS 4/16 1 unit PRBC transfused 4/18 2 units PRBC's transfused 4/20 progressive hypoxia with acute b/l infiltrates then transferred to [facility] for further care and management 4/22 remains hypoxic  4/23 remains hypoxic DX TRALI 4/23 severe hypoxia near cardiac arrest, intubated on VENT 4/27- 245pm - patient self extubated 4/28 -clinically improved, depression with flat affect noted-psychiatry consultation for tommorow 4/29- weaning HHFNC from 70% 45L/min to 50%  4/29  PAST MEDICAL HISTORY   Past Medical History:  Diagnosis Date   CKD (chronic kidney disease)    Diabetes mellitus without complication (HCC)    GERD (gastroesophageal reflux disease)    HTN (hypertension)    IBS (irritable bowel syndrome)    Osteomyelitis ([place])      SURGICAL HISTORY   Past Surgical History:  Procedure Laterality Date   ABDOMINAL AORTOGRAM W/LOWER EXTREMITY Right 12/23/2017   Procedure: ABDOMINAL AORTOGRAM W/LOWER EXTREMITY;  Surgeon: Katha Cabal, MD;  Location: [facility];  Service: Cardiovascular;  Laterality: Right;   ACHILLES TENDON SURGERY Bilateral 06/16/2018   Procedure: ACHILLES LENGTHENING/KIDNER;  Surgeon: Albertine Patricia, DPM;  Location: [facility];  Service: Podiatry;  Laterality: Bilateral;   AMPUTATION Right 12/24/2017   Procedure: AMPUTATION RAY;  Surgeon: Sharlotte Alamo, DPM;  Location: [facility];  Service: Podiatry;  Laterality: Right;   AMPUTATION Left 04/06/2018   Procedure: AMPUTATION RAY;   Surgeon: Sharlotte Alamo, DPM;  Location: [facility];  Service: Podiatry;  Laterality: Left;   AMPUTATION Left 04/09/2018   Procedure: AMPUTATION RAY;  Surgeon: Sharlotte Alamo, DPM;  Location: [facility];  Service: Podiatry;  Laterality: Left;   AMPUTATION Right 10/18/2018   Procedure: AMPUTATION BELOW KNEE;  Surgeon: Katha Cabal, MD;  Location: [facility];  Service: Vascular;  Laterality: Right;   APPLICATION OF WOUND VAC Right 12/24/2017   Procedure: APPLICATION OF WOUND VAC;  Surgeon: Sharlotte Alamo, DPM;  Location: [facility];  Service: Podiatry;  Laterality: Right;   APPLICATION OF WOUND VAC Right 01/01/2018   Procedure: APPLICATION OF WOUND VAC;  Surgeon: Albertine Patricia, DPM;  Location: [facility];  Service: Podiatry;  Laterality: Right;   BONE EXCISION Bilateral 06/16/2018   Procedure: BONE EXCISION AND SOFT TISSUE;  Surgeon: Albertine Patricia, DPM;  Location: [facility];  Service: Podiatry;  Laterality: Bilateral;   IRRIGATION AND DEBRIDEMENT FOOT Right 12/20/2017   Procedure: IRRIGATION AND DEBRIDEMENT FOOT;  Surgeon: Sharlotte Alamo, DPM;  Location: [facility];  Service: Podiatry;  Laterality: Right;   IRRIGATION AND DEBRIDEMENT FOOT Right 12/24/2017   Procedure: IRRIGATION AND DEBRIDEMENT FOOT;  Surgeon: Sharlotte Alamo, DPM;  Location: [facility];  Service: Podiatry;  Laterality: Right;   IRRIGATION AND DEBRIDEMENT FOOT Right 01/01/2018   Procedure: IRRIGATION AND DEBRIDEMENT FOOT-SKIN,SOFT TISSUE AND BONE;  Surgeon: Albertine Patricia, DPM;  Location: [facility];  Service: Podiatry;  Laterality: Right;   IRRIGATION AND DEBRIDEMENT FOOT Left 04/06/2018   Procedure: IRRIGATION AND DEBRIDEMENT FOOT;  Surgeon: Sharlotte Alamo, DPM;  Location: [facility];  Service: Podiatry;  Laterality: Left;   IRRIGATION AND DEBRIDEMENT FOOT Right 09/20/2018   Procedure: IRRIGATION AND DEBRIDEMENT FOOT;  Surgeon: Sharlotte Alamo, DPM;  Location: [facility];  Service: Podiatry;  Laterality: Right;   LOWER EXTREMITY ANGIOGRAPHY Left 04/06/2018  Procedure: Lower Extremity Angiography;  Surgeon: Algernon Huxley, MD;  Location: [place] [facility];  Service: Cardiovascular;  Laterality: Left;   LOWER EXTREMITY ANGIOGRAPHY Bilateral 10/13/2018   Procedure: Lower Extremity Angiography;  Surgeon: Katha Cabal, MD;  Location: [place] [facility];  Service: Cardiovascular;  Laterality: Bilateral;     FAMILY HISTORY   Family History  Problem Relation Age of Onset   Diabetes Mother    Ovarian cancer Mother    Healthy Father      SOCIAL HISTORY   Social History   Tobacco Use   Smoking status: Never Smoker   Smokeless tobacco: Never Used  Substance Use Topics   Alcohol use: No    Frequency: Never   Drug use: Yes    Types: Marijuana, PCP     MEDICATIONS   Current Medication:  Current Facility-Administered Medications:    0.9 %  sodium chloride infusion, , Intravenous, PRN, Harrie Foreman, MD, Stopped at 10/30/18 1438   acetaminophen (TYLENOL) tablet 650 mg, 650 mg, Per Tube, Q6H PRN, 650 mg at 10/28/18 2028 **OR** acetaminophen (TYLENOL) suppository 650 mg, 650 mg, Rectal, Q6H PRN, Flora Lipps, MD, 650 mg at 10/26/18 1622   chlorhexidine gluconate (MEDLINE KIT) (PERIDEX) 0.12 % solution 15 mL, 15 mL, Mouth Rinse, BID, Darel Hong D, NP, 15 mL at 11/01/18 0816   dextrose 50 % solution 12.5 g, 12.5 g, Intravenous, PRN, Flora Lipps, MD, 25 g at 10/30/18 2027   epoetin alfa (EPOGEN) injection 20,000 Units, 20,000 Units, Subcutaneous, Weekly, Murlean Iba, MD, 20,000 Units at 10/26/18 1019   hydrALAZINE (APRESOLINE) injection 10-20 mg, 10-20 mg, Intravenous, Q4H PRN, Awilda Bill, NP, 20 mg at 10/31/18 1745   HYDROmorphone (DILAUDID) 50 mg in sodium chloride 0.9 % 100 mL (0.5 mg/mL) infusion, 0.5-4 mg/hr, Intravenous, Continuous, Kasa, Kurian, MD, Stopped at 10/30/18 1229   HYDROmorphone (DILAUDID) bolus via infusion 0.5 mg, 0.5 mg, Intravenous, Q30 min PRN, Mortimer Fries, Kurian, MD, 0.5 mg at  10/30/18 0821   insulin aspart (novoLOG) injection 0-15 Units, 0-15 Units, Subcutaneous, TID WC, Darel Hong D, NP, 5 Units at 11/01/18 0816   insulin aspart (novoLOG) injection 0-5 Units, 0-5 Units, Subcutaneous, QHS, Darel Hong D, NP, 4 Units at 11/01/18 0045   insulin glargine (LANTUS) injection 14 Units, 14 Units, Subcutaneous, BID, Paticia Stack, RPH, 14 Units at 10/31/18 2006   ipratropium-albuterol (DUONEB) 0.5-2.5 (3) MG/3ML nebulizer solution 3 mL, 3 mL, Nebulization, Q6H PRN, Awilda Bill, NP   ipratropium-albuterol (DUONEB) 0.5-2.5 (3) MG/3ML nebulizer solution 3 mL, 3 mL, Nebulization, Q4H, Kasa, Kurian, MD, 3 mL at 11/01/18 0743   LORazepam (ATIVAN) injection 1-2 mg, 1-2 mg, Intravenous, Q4H PRN, Mortimer Fries, Kurian, MD, 1 mg at 10/29/18 1027   magnesium hydroxide (MILK OF MAGNESIA) suspension 30 mL, 30 mL, Oral, Daily PRN, Schnier, Dolores Lory, MD   morphine 2 MG/ML injection 1-2 mg, 1-2 mg, Intravenous, Q4H PRN, Awilda Bill, NP, 2 mg at 10/31/18 2052   nutrition supplement (JUVEN) (JUVEN) powder packet 1 packet, 1 packet, Per Tube, BID BM, Ottie Glazier, MD   ondansetron (ZOFRAN) injection 4 mg, 4 mg, Intravenous, Q8H PRN, Lanney Gins, Artasia Thang, MD, 4 mg at 10/31/18 1221   pantoprazole (PROTONIX) injection 40 mg, 40 mg, Intravenous, Q24H, Cyndee Brightly M, RPH, 40 mg at 10/31/18 1035   polyethylene glycol (MIRALAX / GLYCOLAX) packet 17 g, 17 g, Oral, Daily PRN, Tukov-Yual, Magdalene S, NP   propofol (DIPRIVAN) 1000 MG/100ML infusion, 5-80 mcg/kg/min, Intravenous,  Titrated, Flora Lipps, MD, Stopped at 10/30/18 1229   vitamin B-12 (CYANOCOBALAMIN) tablet 5,000 mcg, 5,000 mcg, Oral, Daily, Schnier, Dolores Lory, MD, 5,000 mcg at 10/30/18 7353   vitamin C (ASCORBIC ACID) tablet 250 mg, 250 mg, Per Tube, BID, Flora Lipps, MD, 250 mg at 10/31/18 2005    ALLERGIES   Banana; Keflex [cephalexin]; Sulfa antibiotics; Grapeseed extract [nutritional supplements];  Shellfish allergy; and Grape seed    REVIEW OF SYSTEMS     10 point ROS negative except excessive thirst, depression  PHYSICAL EXAMINATION   Vitals:   11/01/18 0600 11/01/18 0751  BP: 128/65   Pulse: (!) 112   Resp: 17   Temp:  98.2 F (36.8 C)  SpO2: 98%     GENERAL: Appears depressed not interested in communication HEAD: Normocephalic, atraumatic.  EYES: Pupils equal, round, reactive to light.  No scleral icterus.  MOUTH: Moist mucosal membrane. NECK: Supple. No thyromegaly. No nodules. No JVD.  PULMONARY: Rhonchorous breath sounds bilaterally CARDIOVASCULAR: S1 and S2. Regular rate and rhythm. No murmurs, rubs, or gallops.  GASTROINTESTINAL: Soft, nontender, non-distended. No masses. Positive bowel sounds. No hepatosplenomegaly.  MUSCULOSKELETAL: No swelling, clubbing, or edema.  NEUROLOGIC: Mild distress due to acute illness SKIN:intact,warm,dry   LABS AND IMAGING     LAB RESULTS: Recent Labs  Lab 10/30/18 0539 10/31/18 0355 11/01/18 0421  NA 144 145 148*  K 3.7 4.0 3.4*  CL 104 102 105  CO2 28 21* 21*  BUN 90* 93* 107*  CREATININE 4.91* 4.92* 5.26*  GLUCOSE 92 171* 235*   Recent Labs  Lab 10/30/18 0539 10/31/18 0355 11/01/18 0421  HGB 7.5* 9.9* 8.1*  HCT 23.5* 31.6* 26.8*  WBC 11.1* 20.3* 18.8*  PLT 375 580* 640*     IMAGING RESULTS: No results found.    ASSESSMENT AND PLAN    -Multidisciplinary rounds held today  Acute Hypoxic Respiratory Failure Moderate ARDS/TRALI - possible concomitant aspiration pneumonia - on zosyn - last day tommorow - appreciate ID input -continue Full MV support -continue Bronchodilator Therapy -Wean Fio2 and PEEP as tolerated -will perform SAT/SBT when respiratory parameters are met    Diabetic ketoacidosis         -Follow DKA protocol phase 1 phase 2         -Positive anion gap metabolic acidosis with urine ketones          -Aggressive fluid rehydration    Right lower extremity critical limb  ischemia S/p BKA - surgery following - Appreciate input   Anemia - of chronic disease and acute blood loss  -s/p PRBC transfusion - h/h monitoring     Renal Failure-diabetic nephropathy and CKD Nephrology on case - appreciate input -follow chem 7 -follow UO -continue Foley Catheter-assess need daily    ID -continue IV abx as prescibed -follow up cultures-negative repeat  GI/Nutrition GI PROPHYLAXIS as indicated DIET-->TF's as tolerated Constipation protocol as indicated  ENDO - ICU hypoglycemic\Hyperglycemia protocol -check FSBS per protocol   ELECTROLYTES -follow labs as needed -replace as needed -pharmacy consultation   DVT/GI PRX ordered -SCDs  TRANSFUSIONS AS NEEDED MONITOR FSBS ASSESS the need for LABS as needed   Critical care provider statement:  Critical care time (minutes):35 Critical care time was exclusive of: Separately billable procedures and treating other patients Critical care was necessary to treat or prevent imminent or life-threatening deterioration of the following conditions:acute hypoxemic respiratory failure, aki, anemia, bka, critical limb ischemia. DM1 Critical care was time spent personally by me on  the following activities: Development of treatment plan with patient or surrogate, discussions with consultants, evaluation of patient's response to treatment, examination of patient, obtaining history from patient or surrogate, ordering and performing treatments and interventions, ordering and review of laboratory studies and re-evaluation of patient's condition. I assumed direction of critical care for this patient from another provider in my specialty: no   This document was prepared using Dragon voice recognition software and may include unintentional dictation errors.    Ottie Glazier, M.D.  Division of [facility]

## 2018-11-01 NOTE — Progress Notes (Signed)
Pharmacy Electrolyte Monitoring Consult:  Pharmacy consulted to assist in monitoring and replacing electrolytes in this 33 y.o. male admitted on 10/18/2018 for Right BKA. Patient subsequently admitted to the ICU for hypoxia and worsening renal function. Patient intubated on 4/23, extubated on 4/27.   Labs:  Sodium (mmol/L)  Date Value  11/01/2018 146 (H)  08/14/2018 128 (L)  10/06/2014 132 (L)   Potassium (mmol/L)  Date Value  11/01/2018 3.3 (L)  10/06/2014 3.4 (L)   Magnesium (mg/dL)  Date Value  10/30/2018 2.1   Phosphorus (mg/dL)  Date Value  10/30/2018 7.3 (H)   Calcium (mg/dL)  Date Value  11/01/2018 8.3 (L)   Calcium, Total (mg/dL)  Date Value  10/06/2014 10.0   Albumin (g/dL)  Date Value  10/29/2018 1.3 (L)  08/14/2018 3.6 (L)  10/06/2014 5.3 (H)    Assessment/Plan: Patient renal function continuing to decline. Will conservatively replace potassium 25mEq PO x 1.   Will obtain electrolytes with am labs.   Will replace for goal potassium 3.6-4.   Pharmacy will continue to monitor and adjust per consult.   Kamiryn Bezanson L 11/01/2018 3:32 PM

## 2018-11-01 NOTE — Consult Note (Signed)
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 therapy provided about ongoing stressors. Discussed crisis plan, support from social network, calling 911, coming to the Emergency Department, and calling Suicide Hotline.   We will encourage social work to be in communication with mother regarding discharge planning.  Lavella Hammock, MD 11/01/2018 9:26 AM

## 2018-11-01 NOTE — Progress Notes (Signed)
[facility] Daily Progress Note  Subjective: 14 Days Post-Op: Right below-the-knee amputation  Patient with flat affect. With high flow nasal cannula.   Objective: Vitals:   11/01/18 0410 11/01/18 0500 11/01/18 0600 11/01/18 0751  BP:  131/78 128/65   Pulse:  (!) 114 (!) 112   Resp:  18 17   Temp:    98.2 F (36.8 C)  TempSrc:    Oral  SpO2:  98% 98%   Weight: 60 kg     Height:        Intake/Output Summary (Last 24 hours) at 11/01/2018 1129 Last data filed at 11/01/2018 7829 Gross per 24 hour  Intake 2234.06 ml  Output 550 ml  Net 1684.06 ml   Physical Exam: A&Ox3, NAD CV: RRR Pulmonary: CTA Bilaterally Abdomen: Soft, Nontender, Nondistended, (+) Bowel Sounds Foley: Intact, draining clear urine Vascular:             Right lower extremity: Thigh soft. Able to straighten at the knee. Stump healthy. Incision clean, dry and intact   Laboratory: CBC    Component Value Date/Time   WBC 18.8 (H) 11/01/2018 0421   HGB 8.1 (L) 11/01/2018 0421   HGB 16.4 10/06/2014 0539   HCT 26.8 (L) 11/01/2018 0421   HCT 47.4 10/06/2014 0539   PLT 640 (H) 11/01/2018 0421   PLT 250 10/06/2014 0539   BMET    Component Value Date/Time   NA 148 (H) 11/01/2018 0421   NA 128 (L) 08/14/2018 1055   NA 132 (L) 10/06/2014 0539   K 3.4 (L) 11/01/2018 0421   K 3.4 (L) 10/06/2014 0539   CL 105 11/01/2018 0421   CL 91 (L) 10/06/2014 0539   CO2 21 (L) 11/01/2018 0421   CO2 26 10/06/2014 0539   GLUCOSE 235 (H) 11/01/2018 0421   GLUCOSE 634 (HH) 10/06/2014 0539   BUN 107 (H) 11/01/2018 0421   BUN 25 (H) 08/14/2018 1055   BUN 16 10/06/2014 0539   CREATININE 5.26 (H) 11/01/2018 0421   CREATININE 1.04 10/06/2014 0539   CALCIUM 8.5 (L) 11/01/2018 0421   CALCIUM 10.0 10/06/2014 0539   GFRNONAA 13 (L) 11/01/2018 0421   GFRNONAA >60 10/06/2014 0539   GFRAA 16 (L) 11/01/2018 0421   GFRAA >60 10/06/2014 0539   Assessment/Planning: The patient is a 33 year old male with  multiple medical issues including peripheral artery disease status post a right below the knee amputation approximatelyoneweek ago transferred to the ICU fordecline inhis respiratory status/hypoxemia,now intubated, self extubated on 10/30/18 1) Swallow study - mild aspiration risk 2) Psych Consult 3) Stump is healthy. No indication for vascular intervention at this time  Discussed with Dr. Eber Hong [facility] PA-C 11/01/2018 11:29 AM

## 2018-11-01 NOTE — Progress Notes (Signed)
[facility], [place] 11/01/18  Subjective:  Patient remains on high flow nasal cannula today. Urine output was 850 cc over the preceding 24 hours. BUN up to 107 with a creatinine of 5.26.   Objective:  Vital signs in last 24 hours:  Temp:  [98.2 F (36.8 C)-98.5 F (36.9 C)] 98.2 F (36.8 C) (04/29 0751) Pulse Rate:  [109-121] 112 (04/29 0600) Resp:  [14-41] 17 (04/29 0600) BP: (116-159)/(56-100) 128/65 (04/29 0600) SpO2:  [89 %-98 %] 98 % (04/29 0600) FiO2 (%):  [50 %-70 %] 50 % (04/29 1132) Weight:  [60 kg] 60 kg (04/29 0410)  Weight change:  Filed Weights   10/28/18 0500 10/30/18 0400 11/01/18 0410  Weight: 64 kg 65 kg 60 kg    Intake/Output:    Intake/Output Summary (Last 24 hours) at 11/01/2018 1137 Last data filed at 11/01/2018 0959 Gross per 24 hour  Intake 2234.06 ml  Output 550 ml  Net 1684.06 ml     Physical Exam: General:  Chronically ill-appearing, thin  HEENT  anicteric, high flow Wolverine in place  Neck  supple  Pulm/lungs  bilaterlal coarse breath sounds  CVS/Heart  S1S2 no rubs  Abdomen:   Soft, nontender  Extremities:  Right BKA, no edema on left  Neurologic:  Awake, alert  Skin:  No acute rashes    Foley in place       Basic Metabolic Panel:  Recent Labs  Lab 10/26/18 0534 10/27/18 0451  10/27/18 2153  10/28/18 2153 10/29/18 0443 10/30/18 0539 10/31/18 0355 11/01/18 0421  NA 134* 137   < > 139   < > 139 141 144 145 148*  K 3.5 3.1*   < > 3.5   < > 3.8 3.8 3.7 4.0 3.4*  CL 104 104   < > 103   < > 100 102 104 102 105  CO2 19* 17*   < > 21*   < > '25 26 28 '$ 21* 21*  GLUCOSE 131* 294*   < > 179*   < > 206* 199* 92 171* 235*  BUN 72* 79*   < > 82*   < > 91* 89* 90* 93* 107*  CREATININE 3.75* 4.28*   < > 4.66*   < > 4.84* 4.88* 4.91* 4.92* 5.26*  CALCIUM 7.8* 7.9*   < > 7.8*   < > 7.5* 7.5* 7.9* 8.5* 8.5*  MG 1.9 2.0  --  1.9  --   --   --  2.1  --   --   PHOS  --  6.8*  --  6.7*  --   --   --  7.3*  --   --    < > = values in this interval not displayed.     CBC: Recent Labs  Lab 10/27/18 0451  10/28/18 0545 10/28/18 2153 10/29/18 0443 10/30/18 0539 10/31/18 0355 11/01/18 0421  WBC 10.5   < > 14.5* 13.2* 12.8* 11.1* 20.3* 18.8*  NEUTROABS 8.9*  --  12.4*  --   --   --  18.9*  --   HGB 5.6*   < > 8.2* 7.8* 7.7* 7.5* 9.9* 8.1*  HCT 18.7*   < > 24.5* 23.7* 24.1* 23.5* 31.6* 26.8*  MCV 89.0   < > 84.2 85.3 87.3 89.0 88.8 91.8  PLT 358   < > 432* 377 386 375 580* 640*   < > = values in this interval not displayed.     No results found  for: HEPBSAG, HEPBSAB, HEPBIGM    Microbiology:  Recent Results (from the past 240 hour(s))  Culture, blood (x 2)     Status: None   Collection Time: 10/23/18  6:49 AM  Result Value Ref Range Status   Specimen Description BLOOD RIGHT ARM  Final   Special Requests   Final    BOTTLES DRAWN AEROBIC AND ANAEROBIC Blood Culture results may not be optimal due to an excessive volume of blood received in culture bottles   Culture   Final    NO GROWTH 5 DAYS Performed at [facility], [address] [place], [place] [address]    Report Status 10/28/2018 FINAL  Final  Culture, blood (x 2)     Status: None   Collection Time: 10/23/18  6:49 AM  Result Value Ref Range Status   Specimen Description BLOOD LEFT WRIST  Final   Special Requests BOTTLES DRAWN AEROBIC AND ANAEROBIC [place]  Final   Culture   Final    NO GROWTH 5 DAYS Performed at [facility], [address] [place], [place] [address]    Report Status 10/28/2018 FINAL  Final  MRSA PCR Screening     Status: None   Collection Time: 10/23/18  7:40 AM  Result Value Ref Range Status   MRSA by PCR NEGATIVE NEGATIVE Final    Comment:        The GeneXpert MRSA Assay (FDA approved for NASAL specimens only), is one component of a comprehensive MRSA colonization surveillance program. It is not intended to diagnose MRSA infection nor to guide or monitor treatment for MRSA  infections. Performed at [facility], [address] [place], [place] [address]   [facility] Coronavirus [facility] order, Performed in [facility] hospital lab)     Status: None   Collection Time: 10/23/18  6:06 PM  Result Value Ref Range Status   SARS Coronavirus 2 NEGATIVE NEGATIVE Final    Comment: (NOTE) If result is NEGATIVE SARS-CoV-2 target nucleic acids are NOT DETECTED. The SARS-CoV-2 RNA is generally detectable in upper and lower  respiratory specimens during the acute phase of infection. The lowest  concentration of SARS-CoV-2 viral copies this assay can detect is 250  copies / mL. A negative result does not preclude SARS-CoV-2 infection  and should not be used as the sole basis for treatment or other  patient management decisions.  A negative result may occur with  improper specimen collection / handling, submission of specimen other  than nasopharyngeal swab, presence of viral mutation(s) within the  areas targeted by this assay, and inadequate number of viral copies  (<250 copies / mL). A negative result must be combined with clinical  observations, patient history, and epidemiological information. If result is POSITIVE SARS-CoV-2 target nucleic acids are DETECTED. The SARS-CoV-2 RNA is generally detectable in upper and lower  respiratory specimens dur ing the acute phase of infection.  Positive  results are indicative of active infection with SARS-CoV-2.  Clinical  correlation with patient history and other diagnostic information is  necessary to determine patient infection status.  Positive results do  not rule out bacterial infection or co-infection with other viruses. If result is PRESUMPTIVE POSTIVE SARS-CoV-2 nucleic acids MAY BE PRESENT.   A presumptive positive result was obtained on the submitted specimen  and confirmed on repeat testing.  While 2019 novel coronavirus  (SARS-CoV-2) nucleic acids may be present in the submitted sample  additional  confirmatory testing may be necessary for epidemiological  and / or clinical management purposes  to differentiate between  SARS-CoV-2 and other Sarbecovirus currently known to infect humans.  If clinically indicated additional testing with an alternate test  methodology [phone]) is advised. The SARS-CoV-2 RNA is generally  detectable in upper and lower respiratory sp ecimens during the acute  phase of infection. The expected result is Negative. Fact Sheet for Patients:  StrictlyIdeas.no Fact Sheet for Healthcare Providers: BankingDealers.co.za This test is not yet approved or cleared by the [place] FDA and has been authorized for detection and/or diagnosis of SARS-CoV-2 by FDA under an Emergency Use Authorization (EUA).  This EUA will remain in effect (meaning this test can be used) for the duration of the COVID-19 declaration under Section 564(b)(1) of the Act, 21 U.S.C. section 360bbb-3(b)(1), unless the authorization is terminated or revoked sooner. Performed at [facility], [address] [place], [place] [address]   Respiratory Panel by PCR     Status: None   Collection Time: 10/24/18  9:17 AM  Result Value Ref Range Status   Adenovirus NOT DETECTED NOT DETECTED Final   Coronavirus 229E NOT DETECTED NOT DETECTED Final    Comment: (NOTE) The Coronavirus on the Respiratory Panel, DOES NOT test for the novel  Coronavirus (2019 nCoV)    Coronavirus HKU1 NOT DETECTED NOT DETECTED Final   Coronavirus NL63 NOT DETECTED NOT DETECTED Final   Coronavirus OC43 NOT DETECTED NOT DETECTED Final   Metapneumovirus NOT DETECTED NOT DETECTED Final   Rhinovirus / Enterovirus NOT DETECTED NOT DETECTED Final   Influenza A NOT DETECTED NOT DETECTED Final   Influenza B NOT DETECTED NOT DETECTED Final   Parainfluenza Virus 1 NOT DETECTED NOT DETECTED Final   Parainfluenza Virus 2 NOT DETECTED NOT DETECTED Final   Parainfluenza  Virus 3 NOT DETECTED NOT DETECTED Final   Parainfluenza Virus 4 NOT DETECTED NOT DETECTED Final   Respiratory Syncytial Virus NOT DETECTED NOT DETECTED Final   Bordetella pertussis NOT DETECTED NOT DETECTED Final   Chlamydophila pneumoniae NOT DETECTED NOT DETECTED Final   Mycoplasma pneumoniae NOT DETECTED NOT DETECTED Final    Comment: Performed at [facility], [place], [place], [place] [address]  Culture, respiratory     Status: None   Collection Time: 10/29/18 12:06 PM  Result Value Ref Range Status   Specimen Description TRACHEAL ASPIRATE  Final   Special Requests NONE  Final   Gram Stain   Final    MODERATE WBC PRESENT,BOTH PMN AND MONONUCLEAR RARE SQUAMOUS EPITHELIAL CELLS PRESENT RARE YEAST WITH PSEUDOHYPHAE    Culture FEW CANDIDA ALBICANS  Final   Report Status 11/01/2018 FINAL  Final    Coagulation Studies: No results for input(s): LABPROT, INR in the last 72 hours.  Urinalysis: No results for input(s): COLORURINE, LABSPEC, PHURINE, GLUCOSEU, HGBUR, BILIRUBINUR, KETONESUR, PROTEINUR, UROBILINOGEN, NITRITE, LEUKOCYTESUR in the last 72 hours.  Invalid input(s): APPERANCEUR    Imaging: Dg Chest Port 1 View  Result Date: 10/31/2018 CLINICAL DATA:  Acute respiratory failure. EXAM: PORTABLE CHEST 1 VIEW COMPARISON:  Radiograph October 30, 2018. FINDINGS: Stable cardiomediastinal silhouette. Endotracheal and nasogastric tubes have been removed. No pneumothorax or pleural effusion is noted. Stable bilateral patchy airspace opacities are noted consistent with pneumonia, edema or ARDS. Bony thorax is unremarkable. IMPRESSION: Endotracheal and nasogastric tubes have been removed. Stable bilateral lung opacities as described above. Electronically Signed   By: Marijo Conception M.D.   On: 10/31/2018 07:14     Medications:   .  sodium chloride Stopped (10/30/18 1438)  . lactated ringers     . chlorhexidine gluconate (MEDLINE KIT)  15 mL Mouth Rinse BID  . epoetin  (EPOGEN/PROCRIT) injection  20,000 Units Subcutaneous Weekly  . feeding supplement (NEPRO CARB STEADY)  237 mL Oral TID BM  . insulin aspart  0-15 Units Subcutaneous TID WC  . insulin aspart  0-5 Units Subcutaneous QHS  . insulin glargine  14 Units Subcutaneous BID  . ipratropium-albuterol  3 mL Nebulization Q4H  . [START ON 11/02/2018] multivitamin-lutein  1 capsule Oral Daily  . [START ON 11/02/2018] pantoprazole  40 mg Oral Daily  . vitamin B-12  5,000 mcg Oral Daily  . ascorbic acid  250 mg Oral BID   sodium chloride, acetaminophen **OR** acetaminophen, dextrose, hydrALAZINE, HYDROmorphone, ipratropium-albuterol, LORazepam, magnesium hydroxide, morphine injection, ondansetron (ZOFRAN) IV, polyethylene glycol  Assessment/ Plan:  33 y.o. 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 2D echo from December 2019 shows moderate concentric hypertrophy, EF 50%, diffuse hypokinesis, grade 1 diastolic dysfunction  1.  Acute kidney injury on chronic kidney disease stage III Baseline creatinine of 1.8/GFR 49 from February 2020 Acute kidney injury appears to be multifactorial  ANA, ANCA are negative; complements normal -R renal function appears to be slowly worsening.  BUN up to 107 with a creatinine of 5.26.  Urine output was 850 cc over the preceding 24 hours.  We did advise the patient that if renal function were to continue to deteriorate we may need to consider renal replacement therapy.  Patient appears to be agreeable to this if deemed necessary.  2.  Acute hypoxic respiratory failure Unclear cause but possibly volume overload versus pneumonia.   Patient is getting IV antibiotics empirically -Patient extubated 10/30/2018.  Currently on high flow nasal  cannula.  3.  Diabetes type 1 with CKD Diabetes is poorly controlled Hemoglobin A1c 13.7% on September 20, 2018  4.  Anemia of chronic kidney disease and blood loss. -Hemoglobin has drifted down further to 8.1.  Patient maintained on Epogen.  Consider blood transfusion for hemoglobin of 7 or less.    LOS: 14 Karlissa Aron 4/29/202011:37 AM  [place], [place]  Note: This note was prepared with Dragon dictation. Any transcription errors are unintentional

## 2018-11-01 NOTE — Progress Notes (Signed)
Patient increasingly agitated in room, stating that he wants to go home. Patient is alert and oriented x4. Darlyn Chamber, NP went in to discuss comorbidities and risks of leaving hospital at this time in the treatment process.  Patient understands risks and wishes to leave against medical advice.  AMA papers were signed, and the foley catheter, as well as all IVs were removed.  Patient dressed himself and was escorted with this RN, NT and security guard to the ED entrance via wheelchair.  Patient was met by family and assisted into vehicle with all belongings.  Cameron Ali, RN

## 2018-11-01 NOTE — Progress Notes (Signed)
Nutrition Follow-up  DOCUMENTATION CODES:   Severe malnutrition in context of chronic illness  INTERVENTION:  Provide Nepro Shake po TID, each supplement provides 425 kcal and 19 grams protein.  Provide Ocuvite daily for wound healing (provides zinc, vitamin A, vitamin C, Vitamin E, copper, and selenium).  Continue vitamin C 250 mg BID.  Will discontinue Juven as it is not nectar-thick.  NUTRITION DIAGNOSIS:   Severe Malnutrition related to chronic illness(uncontrolled DM) as evidenced by moderate fat depletion, severe fat depletion, moderate muscle depletion, severe muscle depletion.  Ongoing.  GOAL:   Patient will meet greater than or equal to 90% of their needs  Progressing.  MONITOR:   Vent status, Labs, Weight trends, Skin, I & O's, TF tolerance  REASON FOR ASSESSMENT:   Malnutrition Screening Tool    ASSESSMENT:   33 y.o. male with type I DM since age 67yrs, IBS, gastroparesis, CKD III admitted with R foot ulcers now s/p R BKA 4/15  Patient self-extubated on 4/27. Following SLP evaluation on afternoon of 4/28 diet was advanced to dysphagia 3 with nectar-thick liquids. Per chart patient ate 50% of his dinner last night.  Medications reviewed and include: Epogen 20000 units weekly, Novolog 0-15 units TID, Novolog 0-5 units QHS, Lantus 14 units BID, pantoprazole, vitamin B12 5000 micrograms daily, vitamin C 250 mg BID.  Labs reviewed: CBG 217-325, Sodium 148, Potassium 3.4, CO2 21, BUN 107, Creatinine 5.26.  Diet Order:   Diet Order            DIET DYS 3 Room service appropriate? Yes; Fluid consistency: Nectar Thick  Diet effective now             EDUCATION NEEDS:   Not appropriate for education at this time  Skin:  Skin Assessment: Skin Integrity Issues:(incision s/p right BKA; stg II to sacrum)  Last BM:  10/31/2018 - medium type 7  Height:   Ht Readings from Last 1 Encounters:  10/18/18 5\' 6"  (1.676 m)   Weight:   Wt Readings from Last 1  Encounters:  11/01/18 60 kg   Ideal Body Weight:  60.6 kg(adjusted for BKA)  BMI:  Body mass index is 21.35 kg/m.  Estimated Nutritional Needs:   Kcal:  1900-2200   Protein:  85-95g/day   Fluid:  >1.5L/day   Willey Blade, MS, RD, LDN Office: [phone] Pager: [phone] After Hours/Weekend Pager: [phone]

## 2018-11-01 NOTE — Progress Notes (Addendum)
Notified by staff pt unwitnessed fall. Upon my arrival at bedside pt alert and oriented appeared to have slid out of bed currently leaning against bed rail bleeding from iv site.  No other injuries noted.  RN to follow post fall protocol will continue to monitor and assess pt. CT Head pending  Marda Stalker, [place] Pager [phone] (please enter 7 digits) PCCM Consult Pager [phone] (please enter 7 digits)

## 2018-11-02 NOTE — Discharge Summary (Signed)
Pt is wanting to go home against medical advice, states he is tired of being in the hospital and that he is going home.  Pt is alert and oriented x4, with no focal deficits, speech clear, pupils PERRLA 3 mm bilaterally.  He has NO suicidal ideation and is NOT a threat to himself or others.  Have discussed with pt at bedside that from a Medical standpoint he is not ready for discharge given his multiple medical comorbidities, and that in order for him to leave he would have to leave against medical advice.  Discussed with him that he will likely deteriorate without his current medical treatment, and that choosing to leave may even end with DEATH.  Pt states he understands that risk, and he is willing to take the risk and go home.  He has signed [place] paperwork. Of note,  Pt did have fall at approximately 1800 this evening, but pt reports he did not hit his head.  No signs of trauma, NEURO exam is normal, and vital signs stable.  Have attempted to get pt to at least let us obtain CT Head before he leaves, but he is very adamant and refusing to have Head CT done before leaving AMA.  He has called his sister to take him home.  Have discussed with his sister that pt wishes to leave AMA and that legally we are unable to keep him against his will since he is coherent and has no suicidal ideation.    Again, pt was not actually discharged, as he chose to leave against medical advice.  Therefore no discharge orders, prescriptions, or follow up appointments were applicable.    Darel Hong, AGACNP-BC [facility] Pager: [phone] Cell: [phone]

## 2018-11-07 ENCOUNTER — Ambulatory Visit: Admit: 2018-11-07 | Payer: Medicaid Other | Admitting: Internal Medicine

## 2018-11-07 SURGERY — COLONOSCOPY WITH PROPOFOL
Anesthesia: General

## 2019-07-24 NOTE — Telephone Encounter (Signed)
Taken care of

## 2019-11-16 IMAGING — MR MR FOOT*R* W/O CM
5 series · 30 of 40 positions shown · non-contrast
Comparison: None.

CLINICAL DATA: Right foot pain and swelling.

EXAM:
MRI OF THE RIGHT FOREFOOT WITHOUT CONTRAST
TECHNIQUE: Multiplanar, multisequence MR imaging of the right forefoot was
performed. No intravenous contrast was administered.

[Series 4: T1 · oblique · 3.0mm · 0.56mm/px · 8 of 43 slices shown (1 of 2)]
[im 1/43]
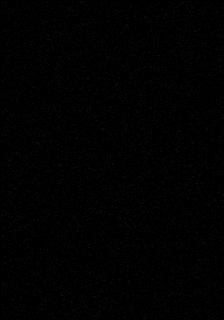
[im 5/43]
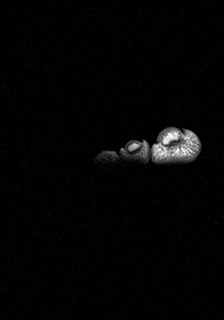
[im 15/43]
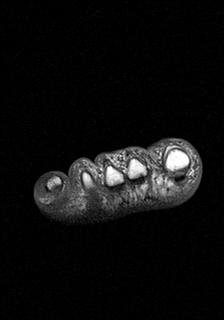
[im 19/43]
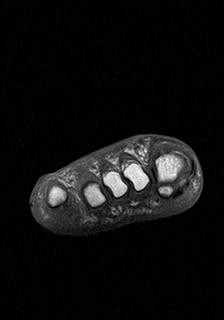
[im 24/43]
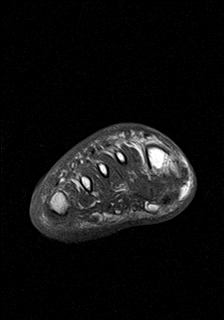
[im 29/43]
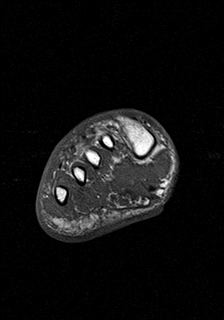
[im 38/43]
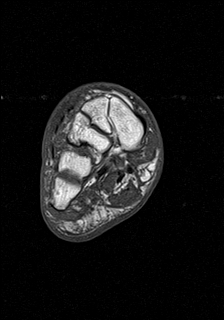
[im 43/43]
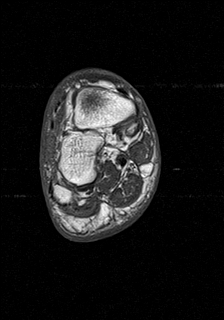

[Series 5: T2 fat-sat · oblique · 3.0mm · 0.35mm/px · 8 of 43 slices shown]
[im 1/43]
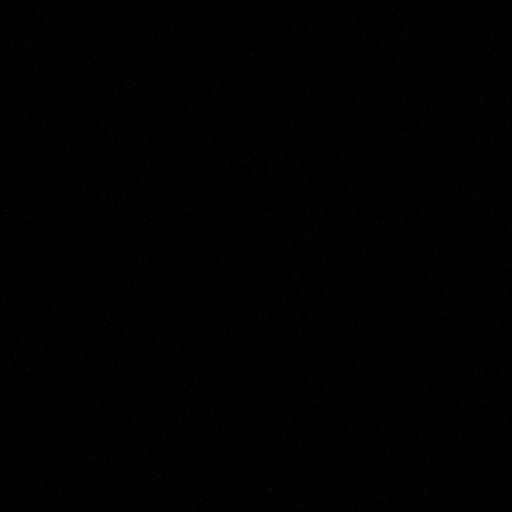
[im 5/43]
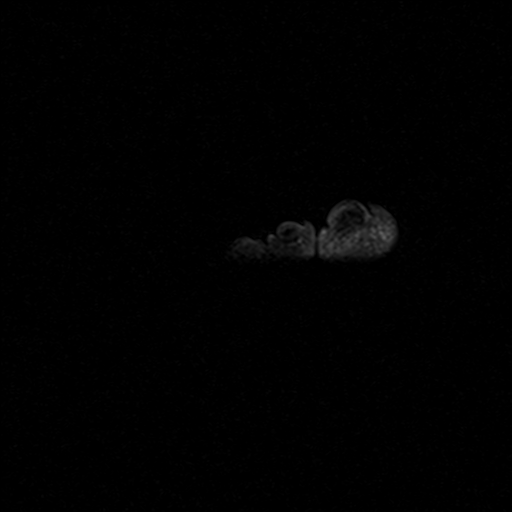
[im 15/43]
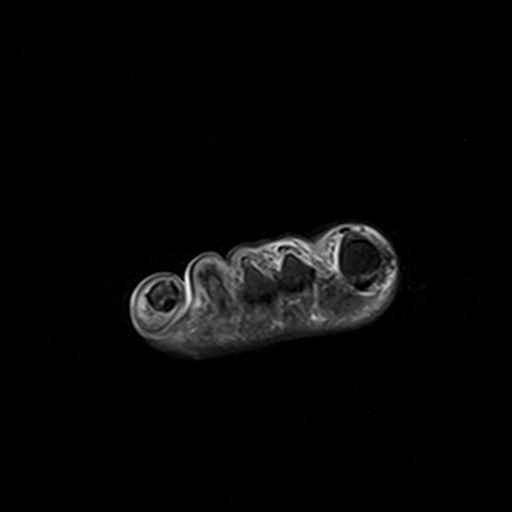
[im 19/43]
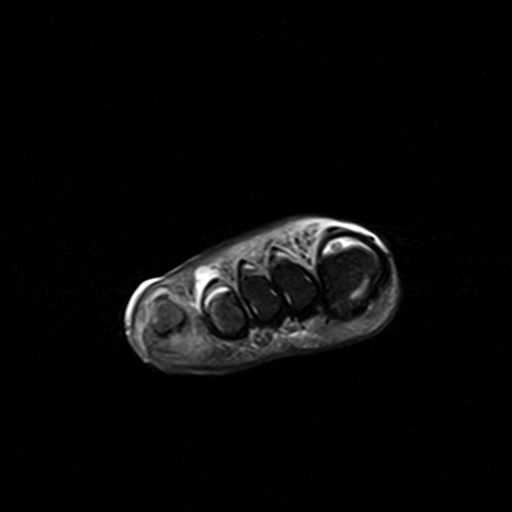
[im 24/43]
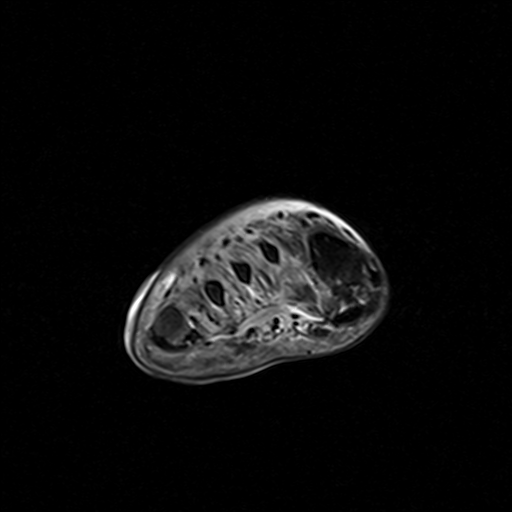
[im 29/43]
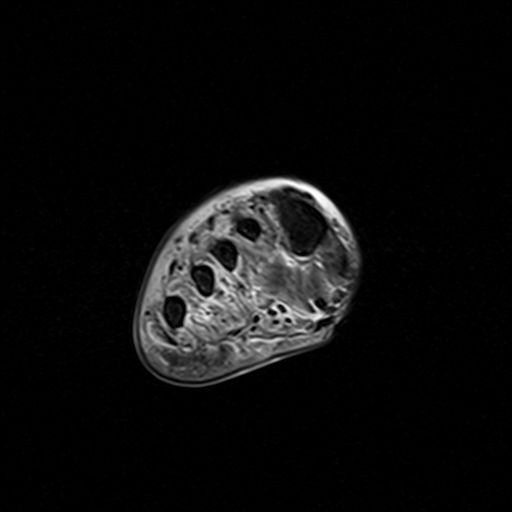
[im 38/43]
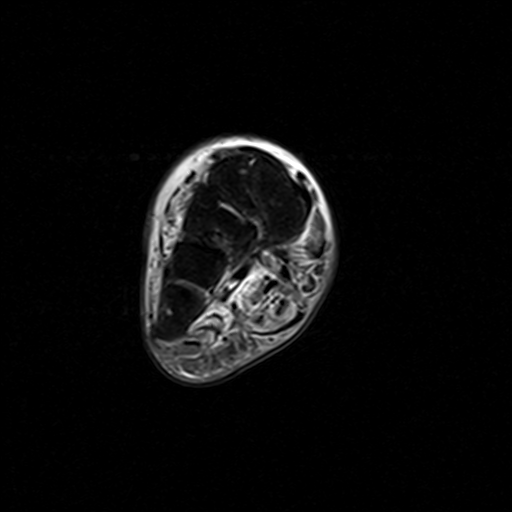
[im 43/43]
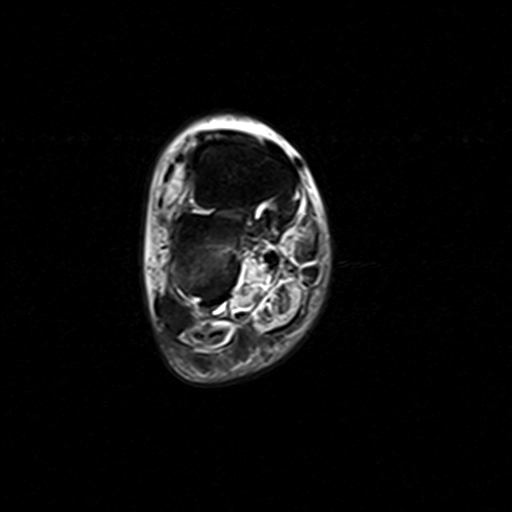

[Series 6: T1 · oblique · 3.0mm · 0.56mm/px · 6 of 27 slices shown (2 of 2)]
[im 1/27]
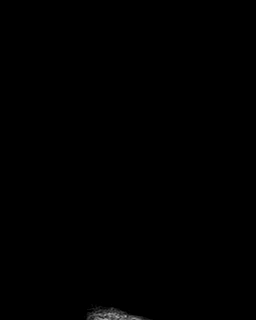
[im 6/27]
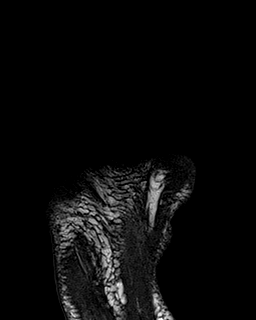
[im 11/27]
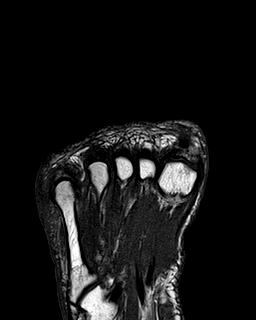
[im 16/27]
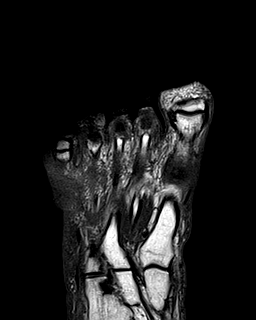
[im 21/27]
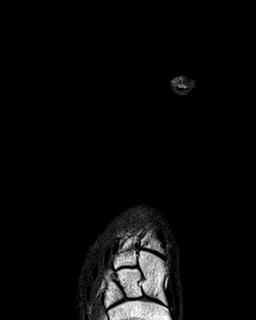
[im 27/27]
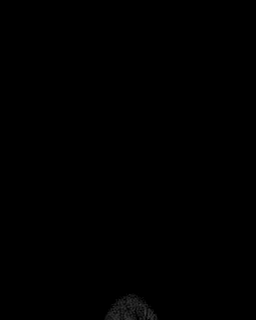

[Series 7: STIR · oblique · 3.0mm · 0.35mm/px · 6 of 27 slices shown (1 of 2)]
[im 1/27]
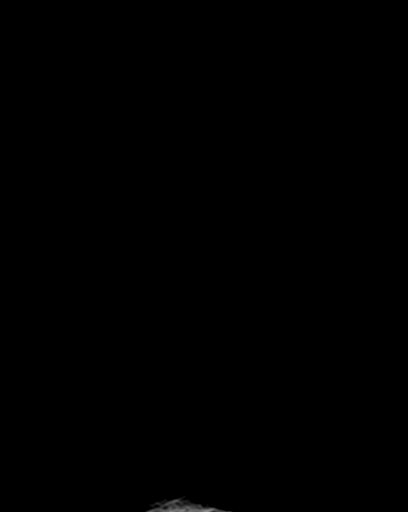
[im 6/27]
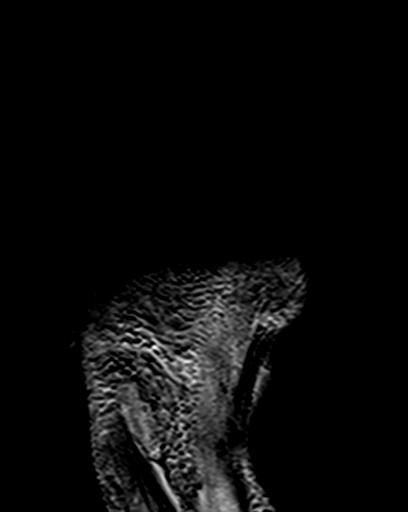
[im 11/27]
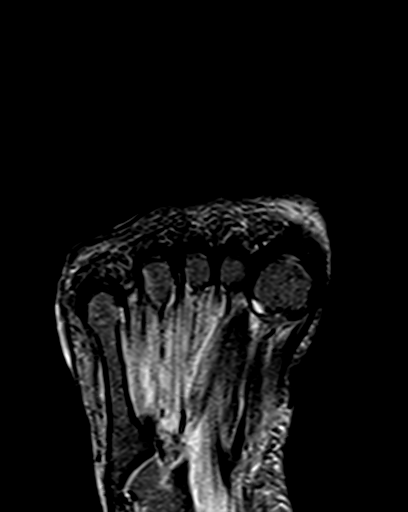
[im 16/27]
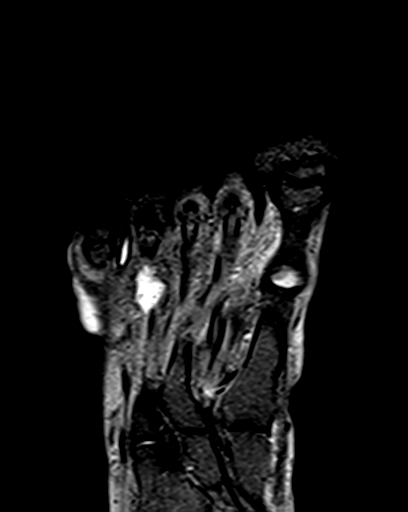
[im 21/27]
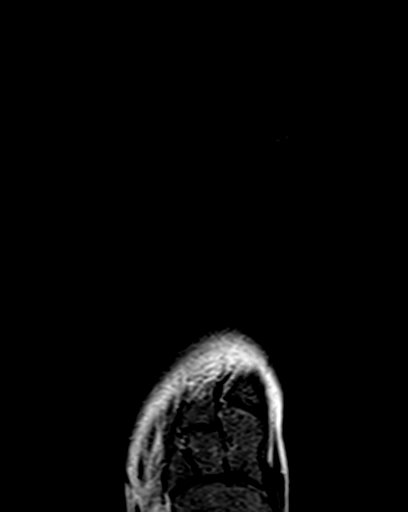
[im 27/27]
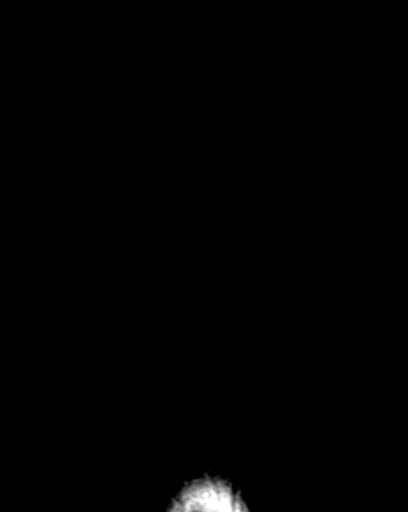

[Series 8: STIR · sagittal · 3.0mm · 0.35mm/px · 2 of 35 slices shown (2 of 2)]
[im 1/35]
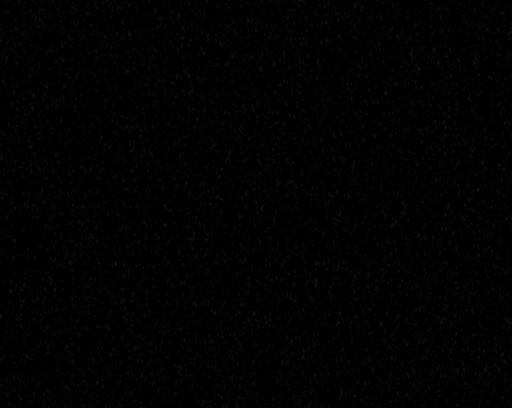
[im 5/35]
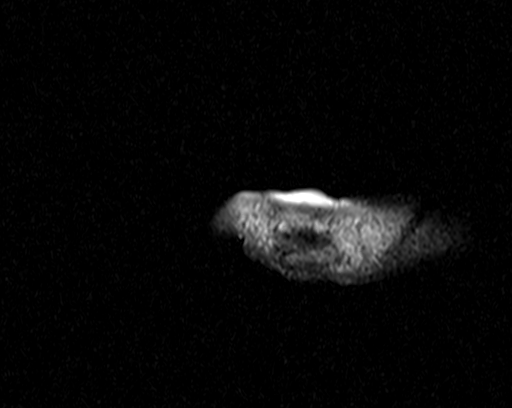

[30 of 40 positions shown; findings below may reference images not displayed]

FINDINGS: Bones/Joint/Cartilage

Soft tissue wound overlying the fifth MTP joint. No periosteal
reaction or bone destruction. No focal marrow signal abnormality.

No fracture or dislocation. Normal alignment. Small first MTP joint
effusion. Soft tissue swelling involving the fifth digit.

Ligaments

Collateral ligaments are intact.  Lisfranc ligament is intact.

Muscles and Tendons
Flexor, peroneal and extensor compartment tendons are intact. T2
hyperintense muscle signal likely neurogenic.

Soft tissue
Small fluid collection along the dorsal aspect of fourth MTP joint
measuring approximately 13 x 12 mm.No soft tissue mass.
IMPRESSION: 1. No osteomyelitis of the right forefoot. Soft tissue wound
overlying the fifth MTP joint with surrounding cellulitis. 13 x 12
mm fluid collection along the dorsal aspect of the fourth MTP joint
which may reflect a small abscess.

## 2019-12-29 ENCOUNTER — Emergency Department: Payer: Medicaid Other

## 2019-12-29 ENCOUNTER — Other Ambulatory Visit: Payer: Self-pay

## 2019-12-29 ENCOUNTER — Inpatient Hospital Stay
Admission: EM | Admit: 2019-12-29 | Discharge: 2020-01-13 | DRG: 208 | Disposition: A | Discharge status: Home / Self Care | Payer: Medicaid Other | Attending: Hospitalist | Admitting: Hospitalist

## 2019-12-29 ENCOUNTER — Encounter: Payer: Self-pay | Admitting: Emergency Medicine

## 2019-12-29 DIAGNOSIS — J189 Pneumonia, unspecified organism: Secondary | ICD-10-CM | POA: Diagnosis present

## 2019-12-29 DIAGNOSIS — K219 Gastro-esophageal reflux disease without esophagitis: Secondary | ICD-10-CM | POA: Diagnosis present

## 2019-12-29 DIAGNOSIS — Z888 Allergy status to other drugs, medicaments and biological substances status: Secondary | ICD-10-CM

## 2019-12-29 DIAGNOSIS — D631 Anemia in chronic kidney disease: Secondary | ICD-10-CM | POA: Diagnosis present

## 2019-12-29 DIAGNOSIS — R197 Diarrhea, unspecified: Secondary | ICD-10-CM | POA: Diagnosis present

## 2019-12-29 DIAGNOSIS — J984 Other disorders of lung: Secondary | ICD-10-CM

## 2019-12-29 DIAGNOSIS — E1022 Type 1 diabetes mellitus with diabetic chronic kidney disease: Secondary | ICD-10-CM | POA: Diagnosis present

## 2019-12-29 DIAGNOSIS — E1043 Type 1 diabetes mellitus with diabetic autonomic (poly)neuropathy: Secondary | ICD-10-CM | POA: Diagnosis present

## 2019-12-29 DIAGNOSIS — N2581 Secondary hyperparathyroidism of renal origin: Secondary | ICD-10-CM | POA: Diagnosis present

## 2019-12-29 DIAGNOSIS — G40909 Epilepsy, unspecified, not intractable, without status epilepticus: Secondary | ICD-10-CM | POA: Diagnosis present

## 2019-12-29 DIAGNOSIS — F329 Major depressive disorder, single episode, unspecified: Secondary | ICD-10-CM | POA: Diagnosis present

## 2019-12-29 DIAGNOSIS — Z91013 Allergy to seafood: Secondary | ICD-10-CM

## 2019-12-29 DIAGNOSIS — Z79891 Long term (current) use of opiate analgesic: Secondary | ICD-10-CM

## 2019-12-29 DIAGNOSIS — Z9114 Patient's other noncompliance with medication regimen: Secondary | ICD-10-CM

## 2019-12-29 DIAGNOSIS — K589 Irritable bowel syndrome without diarrhea: Secondary | ICD-10-CM | POA: Diagnosis present

## 2019-12-29 DIAGNOSIS — F1290 Cannabis use, unspecified, uncomplicated: Secondary | ICD-10-CM | POA: Diagnosis present

## 2019-12-29 DIAGNOSIS — E871 Hypo-osmolality and hyponatremia: Secondary | ICD-10-CM | POA: Diagnosis present

## 2019-12-29 DIAGNOSIS — Z4682 Encounter for fitting and adjustment of non-vascular catheter: Secondary | ICD-10-CM

## 2019-12-29 DIAGNOSIS — Z7401 Bed confinement status: Secondary | ICD-10-CM

## 2019-12-29 DIAGNOSIS — G8929 Other chronic pain: Secondary | ICD-10-CM | POA: Diagnosis not present

## 2019-12-29 DIAGNOSIS — E43 Unspecified severe protein-calorie malnutrition: Secondary | ICD-10-CM | POA: Diagnosis present

## 2019-12-29 DIAGNOSIS — N186 End stage renal disease: Secondary | ICD-10-CM

## 2019-12-29 DIAGNOSIS — R64 Cachexia: Secondary | ICD-10-CM | POA: Diagnosis present

## 2019-12-29 DIAGNOSIS — E1065 Type 1 diabetes mellitus with hyperglycemia: Secondary | ICD-10-CM | POA: Diagnosis present

## 2019-12-29 DIAGNOSIS — Z89511 Acquired absence of right leg below knee: Secondary | ICD-10-CM

## 2019-12-29 DIAGNOSIS — R112 Nausea with vomiting, unspecified: Secondary | ICD-10-CM | POA: Diagnosis not present

## 2019-12-29 DIAGNOSIS — E1042 Type 1 diabetes mellitus with diabetic polyneuropathy: Secondary | ICD-10-CM | POA: Diagnosis present

## 2019-12-29 DIAGNOSIS — Z91018 Allergy to other foods: Secondary | ICD-10-CM

## 2019-12-29 DIAGNOSIS — Z20822 Contact with and (suspected) exposure to covid-19: Secondary | ICD-10-CM | POA: Diagnosis present

## 2019-12-29 DIAGNOSIS — J9621 Acute and chronic respiratory failure with hypoxia: Secondary | ICD-10-CM | POA: Diagnosis present

## 2019-12-29 DIAGNOSIS — F419 Anxiety disorder, unspecified: Secondary | ICD-10-CM | POA: Diagnosis present

## 2019-12-29 DIAGNOSIS — Z0189 Encounter for other specified special examinations: Secondary | ICD-10-CM

## 2019-12-29 DIAGNOSIS — Z882 Allergy status to sulfonamides status: Secondary | ICD-10-CM

## 2019-12-29 DIAGNOSIS — Z7189 Other specified counseling: Secondary | ICD-10-CM | POA: Diagnosis not present

## 2019-12-29 DIAGNOSIS — J9811 Atelectasis: Secondary | ICD-10-CM | POA: Diagnosis not present

## 2019-12-29 DIAGNOSIS — M79673 Pain in unspecified foot: Secondary | ICD-10-CM | POA: Diagnosis present

## 2019-12-29 DIAGNOSIS — L89150 Pressure ulcer of sacral region, unstageable: Secondary | ICD-10-CM | POA: Diagnosis present

## 2019-12-29 DIAGNOSIS — L89159 Pressure ulcer of sacral region, unspecified stage: Secondary | ICD-10-CM | POA: Diagnosis not present

## 2019-12-29 DIAGNOSIS — J850 Gangrene and necrosis of lung: Secondary | ICD-10-CM | POA: Diagnosis present

## 2019-12-29 DIAGNOSIS — I371 Nonrheumatic pulmonary valve insufficiency: Secondary | ICD-10-CM | POA: Diagnosis not present

## 2019-12-29 DIAGNOSIS — A0472 Enterocolitis due to Clostridium difficile, not specified as recurrent: Secondary | ICD-10-CM | POA: Diagnosis present

## 2019-12-29 DIAGNOSIS — R0602 Shortness of breath: Secondary | ICD-10-CM

## 2019-12-29 DIAGNOSIS — Z881 Allergy status to other antibiotic agents status: Secondary | ICD-10-CM

## 2019-12-29 DIAGNOSIS — E875 Hyperkalemia: Secondary | ICD-10-CM | POA: Diagnosis present

## 2019-12-29 DIAGNOSIS — Z89422 Acquired absence of other left toe(s): Secondary | ICD-10-CM

## 2019-12-29 DIAGNOSIS — R059 Cough, unspecified: Secondary | ICD-10-CM

## 2019-12-29 DIAGNOSIS — J918 Pleural effusion in other conditions classified elsewhere: Secondary | ICD-10-CM | POA: Diagnosis not present

## 2019-12-29 DIAGNOSIS — J9690 Respiratory failure, unspecified, unspecified whether with hypoxia or hypercapnia: Secondary | ICD-10-CM

## 2019-12-29 DIAGNOSIS — Z515 Encounter for palliative care: Secondary | ICD-10-CM | POA: Diagnosis not present

## 2019-12-29 DIAGNOSIS — S27392S Other injuries of lung, bilateral, sequela: Secondary | ICD-10-CM | POA: Diagnosis not present

## 2019-12-29 DIAGNOSIS — I120 Hypertensive chronic kidney disease with stage 5 chronic kidney disease or end stage renal disease: Secondary | ICD-10-CM | POA: Diagnosis present

## 2019-12-29 DIAGNOSIS — Z6820 Body mass index (BMI) 20.0-20.9, adult: Secondary | ICD-10-CM

## 2019-12-29 DIAGNOSIS — Z833 Family history of diabetes mellitus: Secondary | ICD-10-CM

## 2019-12-29 DIAGNOSIS — J9601 Acute respiratory failure with hypoxia: Secondary | ICD-10-CM | POA: Diagnosis not present

## 2019-12-29 DIAGNOSIS — R569 Unspecified convulsions: Secondary | ICD-10-CM | POA: Diagnosis not present

## 2019-12-29 DIAGNOSIS — Z01818 Encounter for other preprocedural examination: Secondary | ICD-10-CM

## 2019-12-29 DIAGNOSIS — G894 Chronic pain syndrome: Secondary | ICD-10-CM | POA: Diagnosis present

## 2019-12-29 DIAGNOSIS — K3184 Gastroparesis: Secondary | ICD-10-CM | POA: Diagnosis present

## 2019-12-29 DIAGNOSIS — F1590 Other stimulant use, unspecified, uncomplicated: Secondary | ICD-10-CM | POA: Diagnosis present

## 2019-12-29 DIAGNOSIS — I361 Nonrheumatic tricuspid (valve) insufficiency: Secondary | ICD-10-CM | POA: Diagnosis not present

## 2019-12-29 DIAGNOSIS — Z993 Dependence on wheelchair: Secondary | ICD-10-CM

## 2019-12-29 DIAGNOSIS — J90 Pleural effusion, not elsewhere classified: Secondary | ICD-10-CM | POA: Diagnosis not present

## 2019-12-29 DIAGNOSIS — Z992 Dependence on renal dialysis: Secondary | ICD-10-CM

## 2019-12-29 LAB — CBC WITH DIFFERENTIAL/PLATELET
Abs Immature Granulocytes: 0.01 10*3/uL (ref 0.00–0.07)
Basophils Absolute: 0.1 10*3/uL (ref 0.0–0.1)
Basophils Relative: 1 %
Eosinophils Absolute: 0.2 10*3/uL (ref 0.0–0.5)
Eosinophils Relative: 3 %
HCT: 36.3 % — ABNORMAL LOW (ref 39.0–52.0)
Hemoglobin: 10.8 g/dL — ABNORMAL LOW (ref 13.0–17.0)
Immature Granulocytes: 0 %
Lymphocytes Relative: 14 %
Lymphs Abs: 0.7 10*3/uL (ref 0.7–4.0)
MCH: 26.7 pg (ref 26.0–34.0)
MCHC: 29.8 g/dL — ABNORMAL LOW (ref 30.0–36.0)
MCV: 89.9 fL (ref 80.0–100.0)
Monocytes Absolute: 0.7 10*3/uL (ref 0.1–1.0)
Monocytes Relative: 12 %
Neutro Abs: 3.9 10*3/uL (ref 1.7–7.7)
Neutrophils Relative %: 70 %
Platelets: 210 10*3/uL (ref 150–400)
RBC: 4.04 MIL/uL — ABNORMAL LOW (ref 4.22–5.81)
RDW: 17.9 % — ABNORMAL HIGH (ref 11.5–15.5)
WBC: 5.5 10*3/uL (ref 4.0–10.5)
nRBC: 0 % (ref 0.0–0.2)

## 2019-12-29 LAB — BASIC METABOLIC PANEL
Anion gap: 16 — ABNORMAL HIGH (ref 5–15)
BUN: 66 mg/dL — ABNORMAL HIGH (ref 6–20)
CO2: 22 mmol/L (ref 22–32)
Calcium: 8.6 mg/dL — ABNORMAL LOW (ref 8.9–10.3)
Chloride: 103 mmol/L (ref 98–111)
Creatinine, Ser: 6.07 mg/dL — ABNORMAL HIGH (ref 0.61–1.24)
GFR calc Af Amer: 13 mL/min — ABNORMAL LOW (ref >60)
GFR calc non Af Amer: 11 mL/min — ABNORMAL LOW (ref >60)
Glucose, Bld: 126 mg/dL — ABNORMAL HIGH (ref 70–99)
Potassium: 4.7 mmol/L (ref 3.5–5.1)
Sodium: 141 mmol/L (ref 135–145)

## 2019-12-29 LAB — LACTIC ACID, PLASMA: Lactic Acid, Venous: 1 mmol/L (ref 0.5–1.9)

## 2019-12-29 LAB — BRAIN NATRIURETIC PEPTIDE: B Natriuretic Peptide: 3473.9 pg/mL — ABNORMAL HIGH (ref 0.0–100.0)

## 2019-12-29 LAB — TROPONIN I (HIGH SENSITIVITY): Troponin I (High Sensitivity): 20 ng/L — ABNORMAL HIGH (ref <18)

## 2019-12-29 MED ORDER — CHLORHEXIDINE GLUCONATE CLOTH 2 % EX PADS
6.0000 | MEDICATED_PAD | Freq: Every day | CUTANEOUS | Status: DC
  Filled 2019-12-29: qty 6

## 2019-12-29 MED ORDER — PIPERACILLIN-TAZOBACTAM 3.375 G IVPB 30 MIN
3.3750 g | Freq: Once | INTRAVENOUS | Status: AC
  Administered 2019-12-30: 3.375 g via INTRAVENOUS
  Filled 2019-12-29: qty 50

## 2019-12-29 MED ORDER — IOHEXOL 300 MG/ML  SOLN
75.0000 mL | Freq: Once | INTRAMUSCULAR | Status: AC | PRN
  Administered 2019-12-29: 75 mL via INTRAVENOUS

## 2019-12-29 MED ORDER — DOCUSATE SODIUM 100 MG PO CAPS: 100.0000 mg | ORAL_CAPSULE | Freq: Two times a day (BID) | ORAL | Status: DC | PRN

## 2019-12-29 MED ORDER — INSULIN ASPART 100 UNIT/ML ~~LOC~~ SOLN
0.0000 [IU] | SUBCUTANEOUS | Status: DC
  Administered 2019-12-30: 1 [IU] via SUBCUTANEOUS
  Administered 2019-12-31: 2 [IU] via SUBCUTANEOUS
  Administered 2019-12-31 – 2020-01-10 (×15): 1 [IU] via SUBCUTANEOUS
  Administered 2020-01-11: 2 [IU] via SUBCUTANEOUS
  Administered 2020-01-12 (×2): 1 [IU] via SUBCUTANEOUS
  Filled 2019-12-29 (×22): qty 1

## 2019-12-29 MED ORDER — ONDANSETRON HCL 4 MG/2ML IJ SOLN
4.0000 mg | Freq: Four times a day (QID) | INTRAMUSCULAR | Status: DC | PRN
  Administered 2019-12-31 – 2020-01-12 (×15): 4 mg via INTRAVENOUS
  Filled 2019-12-29 (×15): qty 2

## 2019-12-29 MED ORDER — HEPARIN SODIUM (PORCINE) 5000 UNIT/ML IJ SOLN: 5000.0000 [IU] | Freq: Three times a day (TID) | INTRAMUSCULAR | Status: DC

## 2019-12-29 MED ORDER — VANCOMYCIN HCL IN DEXTROSE 1-5 GM/200ML-% IV SOLN: 1000.0000 mg | Freq: Once | INTRAVENOUS | Status: DC

## 2019-12-29 MED ORDER — IPRATROPIUM-ALBUTEROL 0.5-2.5 (3) MG/3ML IN SOLN
3.0000 mL | RESPIRATORY_TRACT | Status: DC | PRN
  Administered 2020-01-05: 3 mL via RESPIRATORY_TRACT
  Filled 2019-12-29: qty 3

## 2019-12-29 MED ORDER — POLYETHYLENE GLYCOL 3350 17 G PO PACK: 17.0000 g | PACK | Freq: Every day | ORAL | Status: DC | PRN

## 2019-12-29 MED ORDER — VANCOMYCIN VARIABLE DOSE PER UNSTABLE RENAL FUNCTION (PHARMACIST DOSING): Status: DC

## 2019-12-29 NOTE — ED Notes (Signed)
Called pt's sister Agastya Meister and provided an update w/ pt's permission.

## 2019-12-29 NOTE — H&P (Signed)
Name: Shawn [name] MRN: [id number] DOB: 07-07-1985    ADMISSION DATE:  12/29/2019 CONSULTATION DATE:  12/29/2019  REFERRING MD :  Dr. Jimmye Norman  CHIEF COMPLAINT:  Acute Respiratory Distress  BRIEF PATIENT DESCRIPTION:  34 y.o. male with PMH of ESRD on HD admitted 12/29/19 due to Acute Hypoxic Respiratory Failure in the setting of Pulmonary edema/Volume Overload and questionable Necrotizing Pneumonia requiring BiPAP.  Nephrology has been consulted for urgent HD.  SIGNIFICANT EVENTS  6/26: Admission to Stepdown  STUDIES:  6/26: CXR>>1. Persistent bilateral airspace opacities which may represent multifocal pneumonia or sequela of prior infectious process. 2. Apparent new cavitary lesion in the right mid lung zone. Follow-up with a contrast-enhanced CT of the chest is recommended. 3. Significant cardiomegaly with increased in size since prior study. An underlying pericardial effusion should be considered. 4. Well-positioned tunneled dialysis catheter. 5. Small bilateral pleural effusions. 6/26: CT chest w/ Contrast>>1. Extensive areas of consolidated lung, some of which is likely related to some passive atelectatic change. Areas of hypoattenuating lung parenchyma within these regions of consolidation further supported diagnosis of pneumonia. 2. There is an area of cavitation right upper lobe associated with a larger consolidative opacity, could reflect a necrotic pneumonia. 3. Moderate right and moderate to large left pleural effusion. Possibly parapneumonic and/or related to patient's volume status however no convincing CT evidence of empyema at this time. 4. Stable numerous scattered mediastinal, hilar and bilateral axillary lymph nodes, some of which appear low-attenuation, likely combination edematous and reactive. 5. Interval development of additional paraseptal emphysematous/bullous changes in the bilateral upper lobes since prior CT. 6. Left IJ approach dual lumen  dialysis catheter tip terminates at the of the right atrium. A small amount of gas along the catheter tract likely compatible with the recent placement of this access. 7. Cardiomegaly right heart enlargement, pulmonary artery enlargement and reflux of contrast into the IVC. Findings could suggest pulmonary hypertension and elevated right heart pressure/right heart failure. 8. Additional features of anasarca/volume overload with diffuse body wall and mediastinal edematous changes. Periportal edema could suggest some hepatic congestion in the setting of right heart failure. 9. Mild gallbladder wall thickening, nonspecific possibly related to motion or the hepatic congestion. Correlate with right upper quadrant symptoms and consider right upper quadrant ultrasound if clinically warranted 6/27: 2D Echocardiogram>>  CULTURES: SARS-CoV-2 PCR 6/26>> Negative Blood culture x2 6/26>> Sputum 6/26>> Strep pneumo urinary antigen 6/26>> Legionella urinary antigen 6/26>> Respiratory Viral Panel 6/26>> Fungitell 6/26>>  ANTIBIOTICS: Vancomycin 6/26>> Zosyn 6/26>>  HISTORY OF PRESENT ILLNESS:   Shawn [name] is a 34 year old male with a past medical history significant for ESRD on hemodialysis, hypertension, GERD, diabetes mellitus, IBS, osteomyelitis who presents to [facility] ED on 12/29/2019 due to acute respiratory distress and altered mental status.  Apparently patient's family reported that he had an episode of unresponsiveness with questionable convulsions.  Upon EMS arrival he was noted to be hypoxic with O2 saturations in the 70s of which he was placed on nonrebreather mask.  The patient did miss his scheduled hemodialysis session earlier today due to an episode of diarrhea.  He also had a dialysis catheter placed yesterday to his left chest.  Upon presentation to the ED he remained short of breath and hypoxic.  He was afebrile, respiratory rate in the 20s, normal sinus rhythm, blood pressure  116/83, and 95% O2 saturations on nonrebreather.  Initial work-up revealed BUN 66, creatinine 6.07, BNP 3473, high-sensitivity troponin 20, WBC 5.5, hemoglobin 10.8.  CT Chest revealed  extensive areas of consolidation with right upper lobe cavitation concerning for questionable necrotizing pneumonia.  Also noted to have cardiomegaly and moderate right and left pleural effusions.  His COVID-19 PCR is negative.  Given his work of breathing and hypoxia he was subsequently placed on BiPAP.  PCCM is asked to admit the patient to stepdown unit for further work-up and treatment of acute hypoxic respiratory failure secondary to pulmonary edema/volume overload and questionable necrotizing pneumonia.  Nephrology has been consulted for urgent hemodialysis for volume removal.  PAST MEDICAL HISTORY :   has a past medical history of CKD (chronic kidney disease), Diabetes mellitus without complication ([place]), GERD (gastroesophageal reflux disease), HTN (hypertension), IBS (irritable bowel syndrome), and Osteomyelitis ([place]).  has a past surgical history that includes Irrigation and debridement foot (Right, 12/20/2017); Irrigation and debridement foot (Right, 12/24/2017); Amputation (Right, 12/24/2017); Application if wound vac (Right, 12/24/2017); ABDOMINAL AORTOGRAM W/LOWER EXTREMITY (Right, 12/23/2017); Irrigation and debridement foot (Right, 01/01/2018); Application if wound vac (Right, 01/01/2018); Irrigation and debridement foot (Left, 04/06/2018); Amputation (Left, 04/06/2018); Lower Extremity Angiography (Left, 04/06/2018); Amputation (Left, 04/09/2018); Achilles tendon surgery (Bilateral, 06/16/2018); Bone excision (Bilateral, 06/16/2018); Irrigation and debridement foot (Right, 09/20/2018); Lower Extremity Angiography (Bilateral, 10/13/2018); and Amputation (Right, 10/18/2018). Prior to Admission medications   Medication Sig Start Date End Date Taking? Authorizing Provider  ALPRAZolam (XANAX) 0.25 MG tablet Take 1 tablet (0.25  mg total) by mouth 3 (three) times daily as needed for anxiety. 08/18/18   Gladstone Lighter, MD  amoxicillin-clavulanate (AUGMENTIN) 875-125 MG tablet Take 1 tablet by mouth 2 (two) times daily. Patient not taking: Reported on 10/11/2018 09/21/18   Bettey Costa, MD  calcium carbonate (CALCIUM 600) 600 MG TABS tablet Take 1 tablet (600 mg total) by mouth 2 (two) times daily with a meal for 30 days. 09/13/18 10/13/18  Milinda Pointer, MD  diphenoxylate-atropine (LOMOTIL) 2.5-0.025 MG tablet Take 1 tablet by mouth 4 (four) times daily as needed for diarrhea or loose stools.    [provider]  ferrous sulfate 325 (65 FE) MG tablet Take 1 tablet (325 mg total) by mouth 2 (two) times daily with a meal. 01/03/18   Sainani, Belia Heman, MD  gabapentin (NEURONTIN) 300 MG capsule Take 1 capsule (300 mg total) by mouth 2 (two) times daily. 09/21/18   Bettey Costa, MD  insulin aspart (NOVOLOG) 100 UNIT/ML injection Inject 4 Units into the skin 3 (three) times daily with meals. 08/18/18   Gladstone Lighter, MD  insulin glargine (LANTUS) 100 UNIT/ML injection Inject 0.3 mLs (30 Units total) into the skin daily. Patient taking differently: Inject 35 Units into the skin 2 (two) times daily.  08/18/18   Gladstone Lighter, MD  Insulin Syringes, Disposable, U-100 0.3 ML MISC 1 Syringe by Does not apply route 4 (four) times daily -  with meals and at bedtime. 12/27/17   Loletha Grayer, MD  levofloxacin (LEVAQUIN) 750 MG tablet Take 1 tablet (750 mg total) by mouth daily. Patient not taking: Reported on 10/11/2018 09/24/18   Carrie Mew, MD  promethazine (PHENERGAN) 25 MG tablet Take 1 tablet (25 mg total) by mouth every 6 (six) hours as needed for nausea or vomiting. 08/18/18   Gladstone Lighter, MD  vitamin C (VITAMIN C) 250 MG tablet Take 1 tablet (250 mg total) by mouth 2 (two) times daily. 06/20/18   Gladstone Lighter, MD  zolpidem (AMBIEN) 10 MG tablet Take 10 mg by mouth at bedtime.     [provider]   Allergies  Allergen  Reactions  . Banana Hives, Nausea And Vomiting and Rash  . Keflex [Cephalexin] Rash    No swelling- also taken penicillin without any issue.  . Sulfa Antibiotics Anaphylaxis  . Grapeseed Extract [Nutritional Supplements] Itching  . Shellfish Allergy Hives    "ALL SEAFOOD"  . Grape Seed Rash    FAMILY HISTORY:  family history includes Diabetes in his mother; Healthy in his father; Ovarian cancer in his mother. SOCIAL HISTORY:  reports that he has never smoked. He has never used smokeless tobacco. He reports current drug use. Drugs: Marijuana and PCP. He reports that he does not drink alcohol.   COVID-19 DISASTER DECLARATION:  FULL CONTACT PHYSICAL EXAMINATION WAS NOT POSSIBLE DUE TO TREATMENT OF COVID-19 AND  CONSERVATION OF PERSONAL PROTECTIVE EQUIPMENT, LIMITED EXAM FINDINGS INCLUDE-  Patient assessed or the symptoms described in the history of present illness.  In the context of the Global COVID-19 pandemic, which necessitated consideration that the patient might be at risk for infection with the SARS-CoV-2 virus that causes COVID-19, Institutional protocols and algorithms that pertain to the evaluation of patients at risk for COVID-19 are in a state of rapid change based on information released by regulatory bodies including the CDC and federal and state organizations. These policies and algorithms were followed during the patient's care while in hospital.  REVIEW OF SYSTEMS:  Positives in BOLD Constitutional: Negative for fever, chills, weight loss, malaise/fatigue and diaphoresis.  HENT: Negative for hearing loss, ear pain, nosebleeds, congestion, sore throat, neck pain, tinnitus and ear discharge.   Eyes: Negative for blurred vision, double vision, photophobia, pain, discharge and redness.  Respiratory: Negative for cough, hemoptysis, sputum production, +shortness of breath, wheezing and stridor.   Cardiovascular: Negative for chest pain,  palpitations, orthopnea, claudication, leg swelling and PND.  Gastrointestinal: Negative for heartburn, nausea, vomiting, abdominal pain, diarrhea, constipation, blood in stool and melena.  Genitourinary: Negative for dysuria, urgency, frequency, hematuria and flank pain.  Musculoskeletal: Negative for myalgias, back pain, joint pain and falls.  Skin: Negative for itching and rash.  Neurological: Negative for dizziness, tingling, tremors, sensory change, speech change, focal weakness, seizures, loss of consciousness, weakness and headaches.  Endo/Heme/Allergies: Negative for environmental allergies and polydipsia. Does not bruise/bleed easily.  SUBJECTIVE:  Pt reports Shortness of breath, states he doesn't like BiPAP and is wanting to take it off Denies chest pain, cough, sputum production, wheezing, Abdominal pain, N/V/D, fever, chills, edema, dysuria On BiPAP (12/5, 60%, rate 4) with O2 saturations in the mid 90's  VITAL SIGNS: Temp:  [97.7 F (36.5 C)] 97.7 F (36.5 C) (06/26 2113) Pulse Rate:  [66-73] 67 (06/26 2200) Resp:  [20-22] 20 (06/26 2200) BP: (115-118)/(76-84) 116/83 (06/26 2200) SpO2:  [95 %-100 %] 95 % (06/26 2200) Weight:  [52.2 kg] 52.2 kg (06/26 2115)  PHYSICAL EXAMINATION: General: Acutely ill-appearing male, sitting in bed, on BiPAP, with moderate respiratory distress Neuro: Awake, alert and oriented x4, follows commands, no focal deficits, speech clear HEENT: Atraumatic, normocephalic, neck supple, no JVD Cardiovascular: Regular rate and rhythm, good peripheral circulation Lungs: Unable to auscultate due to CAPR, BiPAP assisted, even, tachypnea Abdomen: Soft, nontender, nondistended, no guarding or rebound tenderness Musculoskeletal: Right BKA, no edema Skin: Warm and mildly diaphoretic.  Stage III sacral decubitus and right hip skin tear.  Recent Labs  Lab 12/29/19 2121  NA 141  K 4.7  CL 103  CO2 22  BUN 66*  CREATININE 6.07*  GLUCOSE 126*   Recent  Labs  Lab 12/29/19  2121  HGB 10.8*  HCT 36.3*  WBC 5.5  PLT 210   CT Chest W Contrast  Result Date: 12/29/2019 CLINICAL DATA:  Chest pain, shortness of breath EXAM: CT CHEST WITH CONTRAST TECHNIQUE: Multidetector CT imaging of the chest was performed during intravenous contrast administration. CONTRAST:  82mL OMNIPAQUE IOHEXOL 300 MG/ML  SOLN COMPARISON:  Radiograph 12/29/2019, CT 07/16/2018 FINDINGS: Cardiovascular: Left IJ approach dual lumen dialysis catheter tip terminates at the of the right atrium. A small amount of gas along the catheter tract likely reflects recent placement of this access. There is mild cardiomegaly with predominantly right heart enlargement. Reflux of contrast into the IVC. Small volume pericardial effusion is present as well though this is similar in size to the comparison. The aortic root is suboptimally assessed given cardiac pulsation artifact. No acute luminal abnormality of the aorta. Normal 3 vessel branching of the aortic arch. Proximal great vessels are unremarkable. Central pulmonary arteries are mildly enlarged (aorta ratio 1.2). No large central filling defects on this non tailored examination of the pulmonary arteries with more distal evaluation precluded by motion artifact and poor contrast timing. Mediastinum/Nodes: Diffuse edematous changes of the mesentery as well as fluid throughout the pericardial recesses. Stable numerous scattered mediastinal, hilar and bilateral axillary lymph nodes are present. Nodal examples include a 15 mm left axillary node (2/26, a 12 mm right paratracheal node (2/29), and a 12 mm right hilar node (5/67). Many of these appear low-attenuation in could be edematous and or reactive. Acute abnormality of the esophagus. Trachea is unremarkable. No concerning thyroid nodules or masses. Lungs/Pleura: There is a moderate right and moderate to large left pleural effusion. No significant pleural thickening or enhancement is seen. The effusions do  appear to extend into the fissures and with some mild left apical capping as well. Lobulation or septation is not fully excluded. There are extensive areas of consolidated lung, some of which is likely related to some passive atelectatic change however more dense areas of focal airspace disease are present throughout both lungs largely in a peribronchovascular distribution. There is extensive regions of architectural distortion as well best demonstrated by reticular changes in the left apex. Opacity in the right upper lobe demonstrates some peripheral cavitation as well (2/63). Areas of hypoattenuating lung parenchyma within these regions of consolidation further supported diagnosis of pneumonia. No pneumothorax. Additional areas of widespread tree-in-bud and ground-glass opacity are noted throughout both lungs. Interval development of multiple apical bulla/paraseptal emphysematous change not present on comparison from January 2020 Upper Abdomen: Heterogeneously enhancing liver with periportal edema, could reflect some hepatic congestion or volume overload. Mild gallbladder wall thickening is nonspecific in this given setting, possibly edematous though should correlate for right upper quadrant symptoms. No other acute abnormality in the upper abdomen. Musculoskeletal: No acute osseous abnormality or suspicious osseous lesion. Diffuse body wall edema. IMPRESSION: 1. Extensive areas of consolidated lung, some of which is likely related to some passive atelectatic change. Areas of hypoattenuating lung parenchyma within these regions of consolidation further supported diagnosis of pneumonia. 2. There is an area of cavitation right upper lobe associated with a larger consolidative opacity, could reflect a necrotic pneumonia. 3. Moderate right and moderate to large left pleural effusion. Possibly parapneumonic and/or related to patient's volume status however no convincing CT evidence of empyema at this time. 4. Stable  numerous scattered mediastinal, hilar and bilateral axillary lymph nodes, some of which appear low-attenuation, likely combination edematous and reactive. 5. Interval development of additional paraseptal emphysematous/bullous changes  in the bilateral upper lobes since prior CT. 6. Left IJ approach dual lumen dialysis catheter tip terminates at the of the right atrium. A small amount of gas along the catheter tract likely compatible with the recent placement of this access. 7. Cardiomegaly right heart enlargement, pulmonary artery enlargement and reflux of contrast into the IVC. Findings could suggest pulmonary hypertension and elevated right heart pressure/right heart failure. 8. Additional features of anasarca/volume overload with diffuse body wall and mediastinal edematous changes. Periportal edema could suggest some hepatic congestion in the setting of right heart failure. 9. Mild gallbladder wall thickening, nonspecific possibly related to motion or the hepatic congestion. Correlate with right upper quadrant symptoms and consider right upper quadrant ultrasound if clinically warranted. These results were called by telephone at the time of interpretation on 12/29/2019 at 11:00 pm to provider Lenise Arena , who verbally acknowledged these results. Electronically Signed   By: Lovena Le M.D.   On: 12/29/2019 23:01   DG Chest Port 1 View  Result Date: 12/29/2019 CLINICAL DATA:  Unresponsiveness. EXAM: PORTABLE CHEST 1 VIEW COMPARISON:  10/31/2018 FINDINGS: There is a well-positioned tunneled dialysis catheter on the left. The heart size is significantly enlarged and has increased in size from the prior study. There are coarse bilateral airspace opacities with suggestion of a new cavitary lesion in the right mid lung zone. There are probable small bilateral pleural effusions. No definite evidence for pneumothorax. There are few linear lucencies in the patient's low left neck which are favored to represent  artifact. IMPRESSION: 1. Persistent bilateral airspace opacities which may represent multifocal pneumonia or sequela of prior infectious process. 2. Apparent new cavitary lesion in the right mid lung zone. Follow-up with a contrast-enhanced CT of the chest is recommended. 3. Significant cardiomegaly with increased in size since prior study. An underlying pericardial effusion should be considered. 4. Well-positioned tunneled dialysis catheter. 5. Small bilateral pleural effusions. 6. Electronically Signed   By: Constance Holster M.D.   On: 12/29/2019 21:34    ASSESSMENT / PLAN:  Acute Hypoxic Respiratory Failure in the setting of Pulmonary Edema/Volume Overload and ? Necrotizing Pneumonia -Supplemental O2 as needed to maintain O2 saturations greater than 92% -BiPAP, wean as tolerated -Follow intermittent chest x-ray and ABG as needed -Hemodialysis for volume removal -Antibiotics as above -As needed bronchodilators -Prn Morphine  -Obtain 2D Echocardiogram  ? Necrotizing Pneumonia -Monitor fever curve -Trend WBCs and procalcitonin -Follow cultures as above -Place on vancomycin and Zosyn for now -Consider Cardiothoracic surgery consultation  ESRD on HD -Monitor I&O's / urinary output -Follow BMP -Ensure adequate renal perfusion -Avoid nephrotoxic agents as able -Replace electrolytes as indicated -Nephrology consulted, appreciate input -Plan for urgent dialysis for volume removal  Altered Mental Status>>resolved Hx: Seizures -Provide supportive care -Obtain CT Head (unable to obtain at this time due to Respiratory Distress)  Anemia of Chronic Disease -Monitor for S/Sx of bleeding -Trend CBC -SCD's for VTE Prophylaxis (once able to obtain CT Head, can begin SQ Heparin) -Transfuse for Hgb <7  Diabetes Mellitus -CBG's -SSI -Follow ICU Hypo/hyperglycemia protocol           DISPOSITION: Stepdown GOALS OF CARE: Full Code VTE PROPHYLAXIS: SCD's (once able to obtain CT  head, will begin SQ Heparin) CONSULTS: Nephrology, Wound UPDATES: Updated pt at bedside 12/29/19  Darel Hong, AGACNP-BC [facility] Pager: [phone]  12/29/2019, 11:22 PM

## 2019-12-29 NOTE — ED Notes (Addendum)
Placed pt on 6L [place] and pt was not able to maintain O2 sats in the 90's. Pt placed back on NRB at 10L will continue to monitor.

## 2019-12-29 NOTE — ED Triage Notes (Signed)
Pt arrived from home via [facility]. Pt's family stated pt experience an episode of unresponsiveness w/ convulsions this evening pta. Fire dept arrived and pt had O2 sats of 70% on RA. Pt placed on NRB 15L and sats in 90's during trx. Pt had dialysis catheter placed yesterday but missed dialysis this morning r/t episode of diarrhea. Pt has hx of seizures and diabetes.

## 2019-12-29 NOTE — ED Provider Notes (Signed)
Acute ER Provider Note       Time seen: 9:09 PM    I have reviewed the vital signs and the nursing notes. Level V caveat: History/ROS limited by dyspnea HISTORY   Chief Complaint No chief complaint on file.    HPI Shawn [name] is a 34 y.o. male with a history of chronic kidney disease, diabetes, GERD, hypertension, IBS, osteomyelitis who presents today for respiratory distress.  Patient was marginally responsive and reportedly hypoxic.  Recently had a dialysis catheter placed on his left chest at [facility].  Reportedly had had some diarrhea and therefore missed his dialysis yesterday.  Patient presents short of breath and diaphoretic.  Past Medical History:  Diagnosis Date  . CKD (chronic kidney disease)   . Diabetes mellitus without complication ([place])   . GERD (gastroesophageal reflux disease)   . HTN (hypertension)   . IBS (irritable bowel syndrome)   . Osteomyelitis [facility])     Past Surgical History:  Procedure Laterality Date  . ABDOMINAL AORTOGRAM W/LOWER EXTREMITY Right 12/23/2017   Procedure: ABDOMINAL AORTOGRAM W/LOWER EXTREMITY;  Surgeon: Katha Cabal, MD;  Location: [facility];  Service: Cardiovascular;  Laterality: Right;  . ACHILLES TENDON SURGERY Bilateral 06/16/2018   Procedure: ACHILLES LENGTHENING/KIDNER;  Surgeon: Albertine Patricia, DPM;  Location: [facility] ORS;  Service: Podiatry;  Laterality: Bilateral;  . AMPUTATION Right 12/24/2017   Procedure: AMPUTATION RAY;  Surgeon: Sharlotte Alamo, DPM;  Location: [facility] ORS;  Service: Podiatry;  Laterality: Right;  . AMPUTATION Left 04/06/2018   Procedure: AMPUTATION RAY;  Surgeon: Sharlotte Alamo, DPM;  Location: [facility] ORS;  Service: Podiatry;  Laterality: Left;  . AMPUTATION Left 04/09/2018   Procedure: AMPUTATION RAY;  Surgeon: Sharlotte Alamo, DPM;  Location: [facility] ORS;  Service: Podiatry;  Laterality: Left;  . AMPUTATION Right 10/18/2018   Procedure: AMPUTATION BELOW KNEE;  Surgeon: Katha Cabal, MD;  Location: [facility]  ORS;  Service: Vascular;  Laterality: Right;  . APPLICATION OF WOUND VAC Right 12/24/2017   Procedure: APPLICATION OF WOUND VAC;  Surgeon: Sharlotte Alamo, DPM;  Location: [facility] ORS;  Service: Podiatry;  Laterality: Right;  . APPLICATION OF WOUND VAC Right 01/01/2018   Procedure: APPLICATION OF WOUND VAC;  Surgeon: Albertine Patricia, DPM;  Location: [facility] ORS;  Service: Podiatry;  Laterality: Right;  . BONE EXCISION Bilateral 06/16/2018   Procedure: BONE EXCISION AND SOFT TISSUE;  Surgeon: Albertine Patricia, DPM;  Location: [facility] ORS;  Service: Podiatry;  Laterality: Bilateral;  . IRRIGATION AND DEBRIDEMENT FOOT Right 12/20/2017   Procedure: IRRIGATION AND DEBRIDEMENT FOOT;  Surgeon: Sharlotte Alamo, DPM;  Location: [facility] ORS;  Service: Podiatry;  Laterality: Right;  . IRRIGATION AND DEBRIDEMENT FOOT Right 12/24/2017   Procedure: IRRIGATION AND DEBRIDEMENT FOOT;  Surgeon: Sharlotte Alamo, DPM;  Location: [facility] ORS;  Service: Podiatry;  Laterality: Right;  . IRRIGATION AND DEBRIDEMENT FOOT Right 01/01/2018   Procedure: IRRIGATION AND DEBRIDEMENT FOOT-SKIN,SOFT TISSUE AND BONE;  Surgeon: Albertine Patricia, DPM;  Location: [facility] OR[facility];  Service: Podiatry;  Laterality: Right;  . IRRIGATION AND DEBRIDEMENT FOOT Left 04/06/2018   Procedure: IRRIGATION AND DEBRIDEMENT FOOT;  Surgeon: Sharlotte Alamo, DPM;  Location: [facility] ORS;  Service: Podiatry;  Laterality: Left;  . IRRIGATION AND DEBRIDEMENT FOOT Right 09/20/2018   Procedure: IRRIGATION AND DEBRIDEMENT FOOT;  Surgeon: Sharlotte Alamo, DPM;  Location: [facility] ORS;  Service: Podiatry;  Laterality: Right;  . LOWER EXTREMITY ANGIOGRAPHY Left 04/06/2018   Procedure: Lower Extremity Angiography;  Surgeon: Algernon Huxley, MD;  Location: [facility] INVASIVE CV  LAB;  Service: Cardiovascular;  Laterality: Left;  . LOWER EXTREMITY ANGIOGRAPHY Bilateral 10/13/2018   Procedure: Lower Extremity Angiography;  Surgeon: Katha Cabal, MD;  Location: [facility];  Service: Cardiovascular;  Laterality:  Bilateral;    Allergies Banana, Keflex [cephalexin], Sulfa antibiotics, Grapeseed extract [nutritional supplements], Shellfish allergy, and Grape seed  Review of Systems Constitutional: Negative for fever. Cardiovascular: Negative for chest pain. Respiratory: Positive for shortness of breath Gastrointestinal: Negative for abdominal pain, vomiting and diarrhea. Musculoskeletal: Negative for back pain. Skin: Positive for diaphoresis Neurological: Negative for headaches, positive for weakness  All systems negative/normal/unremarkable except as stated in the HPI  ____________________________________________   PHYSICAL EXAM:  VITAL SIGNS: There were no vitals filed for this visit.  Constitutional: Alert and oriented.  Mild to moderate distress Eyes: Conjunctivae are normal. Normal extraocular movements. ENT      Head: Normocephalic and atraumatic.      Nose: No congestion/rhinnorhea.      Mouth/Throat: Mucous membranes are moist.      Neck: No stridor. Cardiovascular: Normal rate, regular rhythm. No murmurs, rubs, or gallops. Respiratory: Normal respiratory effort without tachypnea nor retractions.  Bilateral rales are noted Gastrointestinal: Soft and nontender. Normal bowel sounds Musculoskeletal: Nontender with normal range of motion in extremities. No lower extremity tenderness nor edema. Neurologic:  Normal speech and language. No gross focal neurologic deficits are appreciated.  Skin:  Skin is warm, diaphoresis is noted Psychiatric: Flat affect ____________________________________________  EKG: Interpreted by me.  Sinus rhythm with rate of 67 bpm, normal PR interval, rest, normal QT  ____________________________________________   LABS (pertinent positives/negatives)  Labs Reviewed  CBC WITH DIFFERENTIAL/PLATELET - Abnormal; Notable for the following components:      Result Value   RBC 4.04 (*)    Hemoglobin 10.8 (*)    HCT 36.3 (*)    MCHC 29.8 (*)    RDW 17.9  (*)    All other components within normal limits  BASIC METABOLIC PANEL - Abnormal; Notable for the following components:   Glucose, Bld 126 (*)    BUN 66 (*)    Creatinine, Ser 6.07 (*)    Calcium 8.6 (*)    GFR calc non Af Amer 11 (*)    GFR calc Af Amer 13 (*)    Anion gap 16 (*)    All other components within normal limits  BRAIN NATRIURETIC PEPTIDE - Abnormal; Notable for the following components:   B Natriuretic Peptide 3,473.9 (*)    All other components within normal limits  TROPONIN I (HIGH SENSITIVITY) - Abnormal; Notable for the following components:   Troponin I (High Sensitivity) 20 (*)    All other components within normal limits   CRITICAL CARE Performed by: Laurence Aly   Total critical care time: 30 minutes  Critical care time was exclusive of separately billable procedures and treating other patients.  Critical care was necessary to treat or prevent imminent or life-threatening deterioration.  Critical care was time spent personally by me on the following activities: development of treatment plan with patient and/or surrogate as well as nursing, discussions with consultants, evaluation of patient's response to treatment, examination of patient, obtaining history from patient or surrogate, ordering and performing treatments and interventions, ordering and review of laboratory studies, ordering and review of radiographic studies, pulse oximetry and re-evaluation of patient's condition.  RADIOLOGY  Images were viewed by me Chest x-ray IMPRESSION: 1. Persistent bilateral airspace opacities which may represent multifocal pneumonia or sequela of  prior infectious process. 2. Apparent new cavitary lesion in the right mid lung zone. Follow-up with a contrast-enhanced CT of the chest is recommended. 3. Significant cardiomegaly with increased in size since prior study. An underlying pericardial effusion should be considered. 4. Well-positioned tunneled dialysis  catheter. 5. Small bilateral pleural effusions.  IMPRESSION: 1. Extensive areas of consolidated lung, some of which is likely related to some passive atelectatic change. Areas of hypoattenuating lung parenchyma within these regions of consolidation further supported diagnosis of pneumonia. 2. There is an area of cavitation right upper lobe associated with a larger consolidative opacity, could reflect a necrotic pneumonia. 3. Moderate right and moderate to large left pleural effusion. Possibly parapneumonic and/or related to patient's volume status however no convincing CT evidence of empyema at this time. 4. Stable numerous scattered mediastinal, hilar and bilateral axillary lymph nodes, some of which appear low-attenuation, likely combination edematous and reactive. 5. Interval development of additional paraseptal emphysematous/bullous changes in the bilateral upper lobes since prior CT. 6. Left IJ approach dual lumen dialysis catheter tip terminates at the of the right atrium. A small amount of gas along the catheter tract likely compatible with the recent placement of this access. 7. Cardiomegaly right heart enlargement, pulmonary artery enlargement and reflux of contrast into the IVC. Findings could suggest pulmonary hypertension and elevated right heart pressure/right heart failure. 8. Additional features of anasarca/volume overload with diffuse body wall and mediastinal edematous changes. Periportal edema could suggest some hepatic congestion in the setting of right heart failure. 9. Mild gallbladder wall thickening, nonspecific possibly related to motion or the hepatic congestion. Correlate with right upper quadrant symptoms and consider right upper quadrant ultrasound if clinically warranted.  DIFFERENTIAL DIAGNOSIS  Flash pulmonary edema, volume overload, end-stage renal disease, electrolyte abnormality, pneumonia, pneumothorax  ASSESSMENT AND PLAN  End-stage renal  disease on dialysis, pulmonary edema, possible necrotizing pneumonia   Plan: The patient had presented for hypoxia and altered mental status. Patient's labs revealed elevated BUN and creatinine as expected after having missed dialysis today.  I did discuss with nephrology on-call who states it was reasonable to get a CT of the chest and they will arrange for dialysis for him in the hospital.  He was placed on BiPAP for persistent dyspnea and hypoxia.  I have ordered broad-spectrum antibiotics to cover for possible necrotizing pneumonia.  Also Covid testing is pending.  Given his extensive medical history and immunocompromise state requiring dialysis I will discuss with the ICU team for admission.  Lenise Arena MD    Note: This note was generated in part or whole with voice recognition software. Voice recognition is usually quite accurate but there are transcription errors that can and very often do occur. I apologize for any typographical errors that were not detected and corrected.     Earleen Newport, MD 12/29/19 [phone]

## 2019-12-29 NOTE — ED Notes (Signed)
Pt trx to CT.  

## 2019-12-30 ENCOUNTER — Inpatient Hospital Stay: Payer: Medicaid Other

## 2019-12-30 ENCOUNTER — Inpatient Hospital Stay (HOSPITAL_COMMUNITY)
Admit: 2019-12-30 | Discharge: 2019-12-30 | Disposition: A | Discharge status: Home / Self Care | Payer: Medicaid Other | Attending: Pulmonary Disease | Admitting: Pulmonary Disease

## 2019-12-30 DIAGNOSIS — I361 Nonrheumatic tricuspid (valve) insufficiency: Secondary | ICD-10-CM

## 2019-12-30 DIAGNOSIS — I371 Nonrheumatic pulmonary valve insufficiency: Secondary | ICD-10-CM

## 2019-12-30 LAB — C DIFFICILE QUICK SCREEN W PCR REFLEX
C Diff antigen: POSITIVE — AB
C Diff toxin: NEGATIVE

## 2019-12-30 LAB — COMPREHENSIVE METABOLIC PANEL
ALT: 18 U/L (ref 0–44)
AST: 12 U/L — ABNORMAL LOW (ref 15–41)
Albumin: 2.9 g/dL — ABNORMAL LOW (ref 3.5–5.0)
Alkaline Phosphatase: 98 U/L (ref 38–126)
Anion gap: 15 (ref 5–15)
BUN: 67 mg/dL — ABNORMAL HIGH (ref 6–20)
CO2: 24 mmol/L (ref 22–32)
Calcium: 8.4 mg/dL — ABNORMAL LOW (ref 8.9–10.3)
Chloride: 102 mmol/L (ref 98–111)
Creatinine, Ser: 6.71 mg/dL — ABNORMAL HIGH (ref 0.61–1.24)
GFR calc Af Amer: 12 mL/min — ABNORMAL LOW (ref >60)
GFR calc non Af Amer: 10 mL/min — ABNORMAL LOW (ref >60)
Glucose, Bld: 66 mg/dL — ABNORMAL LOW (ref 70–99)
Potassium: 5.2 mmol/L — ABNORMAL HIGH (ref 3.5–5.1)
Sodium: 141 mmol/L (ref 135–145)
Total Bilirubin: 0.8 mg/dL (ref 0.3–1.2)
Total Protein: 6.4 g/dL — ABNORMAL LOW (ref 6.5–8.1)

## 2019-12-30 LAB — CBC WITH DIFFERENTIAL/PLATELET
Abs Immature Granulocytes: 0.07 10*3/uL (ref 0.00–0.07)
Basophils Absolute: 0.1 10*3/uL (ref 0.0–0.1)
Basophils Relative: 1 %
Eosinophils Absolute: 0.1 10*3/uL (ref 0.0–0.5)
Eosinophils Relative: 0 %
HCT: 32.4 % — ABNORMAL LOW (ref 39.0–52.0)
Hemoglobin: 10 g/dL — ABNORMAL LOW (ref 13.0–17.0)
Immature Granulocytes: 1 %
Lymphocytes Relative: 5 %
Lymphs Abs: 0.7 10*3/uL (ref 0.7–4.0)
MCH: 27.2 pg (ref 26.0–34.0)
MCHC: 30.9 g/dL (ref 30.0–36.0)
MCV: 88.3 fL (ref 80.0–100.0)
Monocytes Absolute: 1.1 10*3/uL — ABNORMAL HIGH (ref 0.1–1.0)
Monocytes Relative: 8 %
Neutro Abs: 12 10*3/uL — ABNORMAL HIGH (ref 1.7–7.7)
Neutrophils Relative %: 85 %
Platelets: 211 10*3/uL (ref 150–400)
RBC: 3.67 MIL/uL — ABNORMAL LOW (ref 4.22–5.81)
RDW: 17.7 % — ABNORMAL HIGH (ref 11.5–15.5)
WBC: 14 10*3/uL — ABNORMAL HIGH (ref 4.0–10.5)
nRBC: 0 % (ref 0.0–0.2)

## 2019-12-30 LAB — HEMOGLOBIN A1C
Hgb A1c MFr Bld: 7.4 % — ABNORMAL HIGH (ref 4.8–5.6)
Mean Plasma Glucose: 165.68 mg/dL

## 2019-12-30 LAB — CLOSTRIDIUM DIFFICILE BY PCR, REFLEXED: Toxigenic C. Difficile by PCR: POSITIVE — AB

## 2019-12-30 LAB — GLUCOSE, CAPILLARY
Glucose-Capillary: 143 mg/dL — ABNORMAL HIGH (ref 70–99)
Glucose-Capillary: 144 mg/dL — ABNORMAL HIGH (ref 70–99)
Glucose-Capillary: 144 mg/dL — ABNORMAL HIGH (ref 70–99)
Glucose-Capillary: 188 mg/dL — ABNORMAL HIGH (ref 70–99)
Glucose-Capillary: 68 mg/dL — ABNORMAL LOW (ref 70–99)
Glucose-Capillary: 72 mg/dL (ref 70–99)
Glucose-Capillary: 75 mg/dL (ref 70–99)

## 2019-12-30 LAB — ECHOCARDIOGRAM COMPLETE
Height: 66 in
Weight: 1869.5 oz

## 2019-12-30 LAB — PROCALCITONIN
Procalcitonin: 0.35 ng/mL
Procalcitonin: 2.29 ng/mL

## 2019-12-30 LAB — SARS CORONAVIRUS 2 BY RT PCR (HOSPITAL ORDER, PERFORMED IN ~~LOC~~ HOSPITAL LAB): SARS Coronavirus 2: NEGATIVE

## 2019-12-30 LAB — MRSA PCR SCREENING: MRSA by PCR: NEGATIVE

## 2019-12-30 LAB — TROPONIN I (HIGH SENSITIVITY): Troponin I (High Sensitivity): 25 ng/L — ABNORMAL HIGH (ref <18)

## 2019-12-30 LAB — LACTIC ACID, PLASMA: Lactic Acid, Venous: 0.9 mmol/L (ref 0.5–1.9)

## 2019-12-30 LAB — HIV ANTIBODY (ROUTINE TESTING W REFLEX): HIV Screen 4th Generation wRfx: NONREACTIVE

## 2019-12-30 MED ORDER — MORPHINE SULFATE (PF) 2 MG/ML IV SOLN
INTRAVENOUS | Status: AC
  Administered 2019-12-30: 0.5 mg via INTRAVENOUS
  Filled 2019-12-30: qty 1

## 2019-12-30 MED ORDER — CHLORHEXIDINE GLUCONATE CLOTH 2 % EX PADS
6.0000 | MEDICATED_PAD | Freq: Every day | CUTANEOUS | Status: DC
Start: 2019-12-30 — End: 2019-12-30

## 2019-12-30 MED ORDER — VANCOMYCIN 50 MG/ML ORAL SOLUTION: 125.0000 mg | ORAL | Status: DC

## 2019-12-30 MED ORDER — MORPHINE SULFATE (PF) 2 MG/ML IV SOLN: 0.5000 mg | Freq: Once | INTRAVENOUS | Status: AC

## 2019-12-30 MED ORDER — PENTAFLUOROPROP-TETRAFLUOROETH EX AERO
1.0000 "application " | INHALATION_SPRAY | CUTANEOUS | Status: DC | PRN
  Filled 2019-12-30: qty 30

## 2019-12-30 MED ORDER — LIDOCAINE HCL (PF) 1 % IJ SOLN
5.0000 mL | INTRAMUSCULAR | Status: DC | PRN
  Filled 2019-12-30: qty 5

## 2019-12-30 MED ORDER — CHLORHEXIDINE GLUCONATE CLOTH 2 % EX PADS
6.0000 | MEDICATED_PAD | Freq: Every day | CUTANEOUS | Status: DC
  Administered 2019-12-31 – 2020-01-07 (×8): 6 via TOPICAL

## 2019-12-30 MED ORDER — PIPERACILLIN-TAZOBACTAM 3.375 G IVPB
3.3750 g | Freq: Two times a day (BID) | INTRAVENOUS | Status: DC
  Administered 2019-12-30 – 2019-12-31 (×3): 3.375 g via INTRAVENOUS
  Filled 2019-12-30 (×3): qty 50

## 2019-12-30 MED ORDER — VANCOMYCIN HCL IN DEXTROSE 1-5 GM/200ML-% IV SOLN
1000.0000 mg | Freq: Once | INTRAVENOUS | Status: AC
  Administered 2019-12-30: 1000 mg via INTRAVENOUS
  Filled 2019-12-30: qty 200

## 2019-12-30 MED ORDER — LORAZEPAM 2 MG/ML IJ SOLN: 0.5000 mg | Freq: Four times a day (QID) | INTRAMUSCULAR | Status: DC | PRN

## 2019-12-30 MED ORDER — MORPHINE SULFATE (PF) 2 MG/ML IV SOLN
0.5000 mg | INTRAVENOUS | Status: DC | PRN
  Administered 2019-12-30 – 2019-12-31 (×8): 1 mg via INTRAVENOUS
  Filled 2019-12-30 (×8): qty 1

## 2019-12-30 MED ORDER — ALTEPLASE 2 MG IJ SOLR
2.0000 mg | Freq: Once | INTRAMUSCULAR | Status: DC | PRN
  Filled 2019-12-30: qty 2

## 2019-12-30 MED ORDER — PATIROMER SORBITEX CALCIUM 8.4 G PO PACK
8.4000 g | PACK | Freq: Once | ORAL | Status: AC
  Administered 2019-12-30: 8.4 g via ORAL
  Filled 2019-12-30: qty 1

## 2019-12-30 MED ORDER — LIDOCAINE-PRILOCAINE 2.5-2.5 % EX CREA
1.0000 "application " | TOPICAL_CREAM | CUTANEOUS | Status: DC | PRN
  Filled 2019-12-30: qty 5

## 2019-12-30 MED ORDER — VANCOMYCIN 50 MG/ML ORAL SOLUTION
125.0000 mg | Freq: Four times a day (QID) | ORAL | Status: DC
  Administered 2019-12-30 – 2019-12-31 (×3): 125 mg via ORAL
  Filled 2019-12-30 (×4): qty 2.5

## 2019-12-30 MED ORDER — PIPERACILLIN-TAZOBACTAM IN DEX 2-0.25 GM/50ML IV SOLN
2.2500 g | Freq: Three times a day (TID) | INTRAVENOUS | Status: DC
  Filled 2019-12-30 (×4): qty 50

## 2019-12-30 MED ORDER — SODIUM CHLORIDE 0.9 % IV SOLN: 100.0000 mL | INTRAVENOUS | Status: DC | PRN

## 2019-12-30 MED ORDER — VANCOMYCIN 50 MG/ML ORAL SOLUTION: 125.0000 mg | Freq: Every day | ORAL | Status: DC

## 2019-12-30 MED ORDER — HEPARIN SODIUM (PORCINE) 1000 UNIT/ML DIALYSIS
1000.0000 [IU] | INTRAMUSCULAR | Status: DC | PRN
  Filled 2019-12-30: qty 1

## 2019-12-30 MED ORDER — SODIUM CHLORIDE 0.9 % IV SOLN
100.0000 mL | INTRAVENOUS | Status: DC | PRN
  Administered 2020-01-04: 100 mL via INTRAVENOUS

## 2019-12-30 MED ORDER — EPOETIN ALFA 4000 UNIT/ML IJ SOLN
3000.0000 [IU] | INTRAMUSCULAR | Status: DC
  Administered 2019-12-31: 3000 [IU] via SUBCUTANEOUS
  Filled 2019-12-30: qty 1

## 2019-12-30 MED ORDER — VANCOMYCIN 50 MG/ML ORAL SOLUTION: 125.0000 mg | Freq: Two times a day (BID) | ORAL | Status: DC

## 2019-12-30 NOTE — Consult Note (Signed)
Shawn [name] MRN: [id number] DOB/AGE: 1985/07/11 34 y.o. Primary Care Physician:Olmedo, Guy Begin, MD Admit date: 12/29/2019 Chief Complaint:  Chief Complaint  Patient presents with  . Loss of Consciousness   HPI: Patient is a 34 year old Caucasian male with a past medical history of ESRD, diabetes mellitus, GERD, hypertension, IBS, osteomyelitis who was brought to the ER with chief complaint of loss of consciousness.  History of present illness dates back to yesterday when patient family noticed that patient was unresponsive and EMS was called. Upon EMS evaluation patient was found to be hypoxic with oxygen saturation into the 70s, patient was placed on nonrebreather mask and brought to the ER. Upon evaluation in the ER patient CT scan was done which showed extensive areas of consolidation with right upper lobe cavitation with questionable necrotizing pneumonia.  Patient was also found to have large pleural effusion.  Patient was admitted for further treatment Patient was seen today in stepdown unit Patient offers no complaint of fever or cough No complaint of hematuria No complaint of change in speech or vision No complaint of recent Covid exposure. Patient main concern and this morning was advised if he can go home.  I then had extensive descent with patient and informed him about his clinical disease and requested him not to sign out AMA Patient other major concern was if he can have a cup of ice. Patient is on hemodialysis on Tuesday Thursday Saturday schedule and missed his yesterday's dialysis  Past Medical History:  Diagnosis Date  . CKD (chronic kidney disease)   . Diabetes mellitus without complication ([place])   . GERD (gastroesophageal reflux disease)   . HTN (hypertension)   . IBS (irritable bowel syndrome)   . Osteomyelitis ([place])         Family History  Problem Relation Age of Onset  . Diabetes Mother   . Ovarian cancer Mother   . Healthy Father    Social  History:  reports that he has never smoked. He has never used smokeless tobacco. He reports current drug use. Drugs: Marijuana and PCP. He reports that he does not drink alcohol.   Allergies:  Allergies  Allergen Reactions  . Banana Hives, Nausea And Vomiting and Rash  . Keflex [Cephalexin] Rash    No swelling- also taken penicillin without any issue.  . Sulfa Antibiotics Anaphylaxis  . Grapeseed Extract [Nutritional Supplements] Itching  . Shellfish Allergy Hives    "ALL SEAFOOD"  . Grape Seed Rash    Medications Prior to Admission  Medication Sig Dispense Refill  . ALPRAZolam (XANAX) 0.25 MG tablet Take 1 tablet (0.25 mg total) by mouth 3 (three) times daily as needed for anxiety. 12 tablet 0  . amoxicillin-clavulanate (AUGMENTIN) 875-125 MG tablet Take 1 tablet by mouth 2 (two) times daily. (Patient not taking: Reported on 10/11/2018) 16 tablet 0  . calcium carbonate (CALCIUM 600) 600 MG TABS tablet Take 1 tablet (600 mg total) by mouth 2 (two) times daily with a meal for 30 days. 60 tablet 0  . diphenoxylate-atropine (LOMOTIL) 2.5-0.025 MG tablet Take 1 tablet by mouth 4 (four) times daily as needed for diarrhea or loose stools.    . ferrous sulfate 325 (65 FE) MG tablet Take 1 tablet (325 mg total) by mouth 2 (two) times daily with a meal. 60 tablet 0  . gabapentin (NEURONTIN) 300 MG capsule Take 1 capsule (300 mg total) by mouth 2 (two) times daily. 60 capsule 0  . insulin aspart (NOVOLOG) 100 UNIT/ML injection  Inject 4 Units into the skin 3 (three) times daily with meals. 10 mL 11  . insulin glargine (LANTUS) 100 UNIT/ML injection Inject 0.3 mLs (30 Units total) into the skin daily. (Patient taking differently: Inject 35 Units into the skin 2 (two) times daily. ) 10 mL 11  . Insulin Syringes, Disposable, U-100 0.3 ML MISC 1 Syringe by Does not apply route 4 (four) times daily -  with meals and at bedtime. 100 each 0  . levofloxacin (LEVAQUIN) 750 MG tablet Take 1 tablet (750 mg total)  by mouth daily. (Patient not taking: Reported on 10/11/2018) 10 tablet 0  . promethazine (PHENERGAN) 25 MG tablet Take 1 tablet (25 mg total) by mouth every 6 (six) hours as needed for nausea or vomiting. 30 tablet 0  . vitamin C (VITAMIN C) 250 MG tablet Take 1 tablet (250 mg total) by mouth 2 (two) times daily. 60 tablet 0  . zolpidem (AMBIEN) 10 MG tablet Take 10 mg by mouth at bedtime.          BPZ:WCHEN from the symptoms mentioned above,there are no other symptoms referable to all systems reviewed.  . Chlorhexidine Gluconate Cloth  6 each Topical Daily  . insulin aspart  0-6 Units Subcutaneous Q4H  . vancomycin variable dose per unstable renal function (pharmacist dosing)   Does not apply See admin instructions       Physical Exam: Vital signs in last 24 hours: Temp:  [97.7 F (36.5 C)-98.1 F (36.7 C)] 98.1 F (36.7 C) (06/27 0042) Pulse Rate:  [66-80] 80 (06/27 0500) Resp:  [20-39] 23 (06/27 0500) BP: (115-156)/(76-100) 133/83 (06/27 0500) SpO2:  [82 %-100 %] 96 % (06/27 0500) Weight:  [52.2 kg-53 kg] 53 kg (06/27 0443) Weight change:  Last BM Date: 12/30/19  Intake/Output from previous day: 06/26 0701 - 06/27 0700 In: 200 [IV Piggyback:200] Out: -  No intake/output data recorded.   Physical Exam: General- pt is awake,alert, oriented to time place and person Resp- No acute REsp distress, Rhonchi+, decreased breath sounds at bases CVS- S1S2 regular in rate and rhythm GIT- BS+, soft, NT, ND EXT- NO LE Edema, Cyanosis, right BKA CNS- CN 2-12 grossly intact. Moving all 4 extremities Psych- normal mood and affect Access- PC.   Lab Results: CBC Recent Labs    12/29/19 2121 12/30/19 0500  WBC 5.5 14.0*  HGB 10.8* 10.0*  HCT 36.3* 32.4*  PLT 210 211    BMET Recent Labs    12/29/19 2121 12/30/19 0500  NA 141 141  K 4.7 5.2*  CL 103 102  CO2 22 24  GLUCOSE 126* 66*  BUN 66* 67*  CREATININE 6.07* 6.71*  CALCIUM 8.6* 8.4*    MICRO Recent Results  (from the past 240 hour(s))  SARS Coronavirus 2 by RT PCR (hospital order, performed in [facility] hospital lab) Nasopharyngeal Nasopharyngeal Swab     Status: None   Collection Time: 12/29/19 10:59 PM   Specimen: Nasopharyngeal Swab  Result Value Ref Range Status   SARS Coronavirus 2 NEGATIVE NEGATIVE Final    Comment: (NOTE) SARS-CoV-2 target nucleic acids are NOT DETECTED.  The SARS-CoV-2 RNA is generally detectable in upper and lower respiratory specimens during the acute phase of infection. The lowest concentration of SARS-CoV-2 viral copies this assay can detect is 250 copies / mL. A negative result does not preclude SARS-CoV-2 infection and should not be used as the sole basis for treatment or other patient management decisions.  A negative result may  occur with improper specimen collection / handling, submission of specimen other than nasopharyngeal swab, presence of viral mutation(s) within the areas targeted by this assay, and inadequate number of viral copies (<250 copies / mL). A negative result must be combined with clinical observations, patient history, and epidemiological information.  Fact Sheet for Patients:   StrictlyIdeas.no  Fact Sheet for Healthcare Providers: BankingDealers.co.za  This test is not yet approved or  cleared by the [place] FDA and has been authorized for detection and/or diagnosis of SARS-CoV-2 by FDA under an Emergency Use Authorization (EUA).  This EUA will remain in effect (meaning this test can be used) for the duration of the COVID-19 declaration under Section 564(b)(1) of the Act, 21 U.S.C. section 360bbb-3(b)(1), unless the authorization is terminated or revoked sooner.  Performed at [facility], [address] [place], [place] [address]   MRSA PCR Screening     Status: None   Collection Time: 12/30/19 12:25 AM   Specimen: Nasopharyngeal  Result Value Ref Range Status    MRSA by PCR NEGATIVE NEGATIVE Final    Comment:        The GeneXpert MRSA Assay (FDA approved for NASAL specimens only), is one component of a comprehensive MRSA colonization surveillance program. It is not intended to diagnose MRSA infection nor to guide or monitor treatment for MRSA infections. Performed at [facility], [address], [place], [place] [address]       Lab Results  Component Value Date   CALCIUM 8.4 (L) 12/30/2019   PHOS 7.3 (H) 10/30/2018      Impression:   Patient is a 34 year old Caucasian male with a past medical history of ESRD, hypertension, diabetes mellitus, GERD, IBS, osteomyelitis who was admitted on June 26 with chief complaint of acute respiratory distress, altered mental status and patient was found to have necrotizing pneumonia   1)Renal ESRD Patient is on hemodialysis Patient is on Tuesday Thursday Saturday schedule Patient missed his dialysis yesterday Patient is saturating well on nasal cannula Patient does not need emergent dialysis today We will dialyze patient tomorrow  2)HTN Blood pressure is stable   3)Anemia of chronic disease  HGb at goal (9--11) We will keep patient on Epogen  4) secondary hyperparathyroidism CKD Mineral-Bone Disorder  Secondary Hyperparathyroidism present  phosphorus is not at goal. We will follow up on phosphorus levels  5) necrotizing pneumonia  Patient is on broad-spectrum antibiotics  6) electrolytes-hyperkalemia  Secondary to ESRD We will give patient dose of Veltassa  7)Acid base Co2 at goal  8) pleural effusion Patient may need pleural tap Pulmonary/primary team following    Plan:  Will give patient dose of Veltassa today No need for hemodialysis today We will dialyze patient tomorrow We will keep patient on Epogen       Fareeha Evon s [facility] 12/30/2019, 7:06 AM

## 2019-12-30 NOTE — Progress Notes (Signed)
*  PRELIMINARY RESULTS* Echocardiogram 2D Echocardiogram has been performed.  Shawn [name] 12/30/2019, 10:25 AM

## 2019-12-30 NOTE — Progress Notes (Signed)
Stool sample sent to lab.   Fuller Mandril, RN

## 2019-12-30 NOTE — Progress Notes (Signed)
Pharmacy Antibiotic Note  Shawn [name] is a 34 y.o. 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  Pharmacy has been consulted for Vancomycin dosing.  Plan: Will order Vancomycin short taper for recurrent C.diff. Patient is currently on IV Vancomycin and Zosyn for treatment of pneumonia.   Height: 5\' 6"  (167.6 cm) Weight: 53 kg (116 lb 13.5 oz) IBW/kg (Calculated) : 63.8  Temp (24hrs), Avg:98.3 F (36.8 C), Min:97.7 F (36.5 C), Max:99.3 F (37.4 C)  Recent Labs  Lab 12/29/19 2121 12/29/19 2259 12/30/19 0041 12/30/19 0500  WBC 5.5  --   --  14.0*  CREATININE 6.07*  --   --  6.71*  LATICACIDVEN  --  1.0 0.9  --     Estimated Creatinine Clearance: 11.8 mL/min (A) (by C-G formula based on SCr of 6.71 mg/dL (H)).    Allergies  Allergen Reactions  . Banana Hives, Nausea And Vomiting and Rash  . Keflex [Cephalexin] Rash    No swelling- also taken penicillin without any issue.  . Sulfa Antibiotics Anaphylaxis  . Grapeseed Extract [Nutritional Supplements] Itching  . Shellfish Allergy Hives    "ALL SEAFOOD"  . Grape Seed Rash    Thank you for allowing pharmacy to be a part of this patient's care.  Paulina Fusi, PharmD, BCPS 12/30/2019 4:47 PM

## 2019-12-30 NOTE — ED Notes (Signed)
Shawn [name] sister [phone]

## 2019-12-30 NOTE — Progress Notes (Signed)
Pharmacy Antibiotic Note  Shawn [name] is a 34 y.o. male admitted on 12/29/2019 with pneumonia.  Pharmacy has been consulted for Vancomycin and Zosyn dosing.  Zosyn 3.375gm x 1 and Vancomycin 1gm x 1 ordered in ED  Plan: Hemodialysis patient- will adjust Zosyn to 3.375gm IV q12h   -f/u to order Vancomycin dose based on Hemodialysis schedule    Height: 5\' 6"  (167.6 cm) Weight: 53 kg (116 lb 13.5 oz) IBW/kg (Calculated) : 63.8  Temp (24hrs), Avg:98.3 F (36.8 C), Min:97.7 F (36.5 C), Max:99.3 F (37.4 C)  Recent Labs  Lab 12/29/19 2121 12/29/19 2259 12/30/19 0041 12/30/19 0500  WBC 5.5  --   --  14.0*  CREATININE 6.07*  --   --  6.71*  LATICACIDVEN  --  1.0 0.9  --     Estimated Creatinine Clearance: 11.8 mL/min (A) (by C-G formula based on SCr of 6.71 mg/dL (H)).    Allergies  Allergen Reactions  . Banana Hives, Nausea And Vomiting and Rash  . Keflex [Cephalexin] Rash    No swelling- also taken penicillin without any issue.  . Sulfa Antibiotics Anaphylaxis  . Grapeseed Extract [Nutritional Supplements] Itching  . Shellfish Allergy Hives    "ALL SEAFOOD"  . Grape Seed Rash    Antimicrobials this admission:   >>    >>   Dose adjustments this admission:   Microbiology results:  BCx:   UCx:    Sputum:    MRSA PCR:   Thank you for allowing pharmacy to be a part of this patient's care.  Dameon Soltis A 12/30/2019 1:41 PM

## 2019-12-30 NOTE — Progress Notes (Signed)
Pharmacy Antibiotic Note  Shawn [name] is a 34 y.o. male admitted on 12/29/2019 with pneumonia.  Pharmacy has been consulted for Vancomycin and Zosyn dosing. Zosyn 3.375gm x 1 and Vancomycin 1gm x 1 ordered in ED  Plan: Zosyn 2.25gm IV q8hrs (renally adjusted)  Vancomycin variable dosing based on levels & renal function  Height: 5\' 6"  (167.6 cm) Weight: 52.2 kg (115 lb) IBW/kg (Calculated) : 63.8  Temp (24hrs), Avg:97.7 F (36.5 C), Min:97.7 F (36.5 C), Max:97.7 F (36.5 C)  Recent Labs  Lab 12/29/19 2121 12/29/19 2259  WBC 5.5  --   CREATININE 6.07*  --   LATICACIDVEN  --  1.0    Estimated Creatinine Clearance: 12.9 mL/min (A) (by C-G formula based on SCr of 6.07 mg/dL (H)).    Allergies  Allergen Reactions  . Banana Hives, Nausea And Vomiting and Rash  . Keflex [Cephalexin] Rash    No swelling- also taken penicillin without any issue.  . Sulfa Antibiotics Anaphylaxis  . Grapeseed Extract [Nutritional Supplements] Itching  . Shellfish Allergy Hives    "ALL SEAFOOD"  . Grape Seed Rash    Antimicrobials this admission:   >>    >>   Dose adjustments this admission:   Microbiology results:  BCx:   UCx:    Sputum:    MRSA PCR:   Thank you for allowing pharmacy to be a part of this patient's care.  Hart Robinsons A 12/30/2019 12:30 AM

## 2019-12-31 DIAGNOSIS — J9601 Acute respiratory failure with hypoxia: Secondary | ICD-10-CM

## 2019-12-31 LAB — CBC WITH DIFFERENTIAL/PLATELET
Abs Immature Granulocytes: 0.05 10*3/uL (ref 0.00–0.07)
Basophils Absolute: 0.1 10*3/uL (ref 0.0–0.1)
Basophils Relative: 1 %
Eosinophils Absolute: 0.4 10*3/uL (ref 0.0–0.5)
Eosinophils Relative: 3 %
HCT: 33.3 % — ABNORMAL LOW (ref 39.0–52.0)
Hemoglobin: 10.4 g/dL — ABNORMAL LOW (ref 13.0–17.0)
Immature Granulocytes: 0 %
Lymphocytes Relative: 18 %
Lymphs Abs: 2.3 10*3/uL (ref 0.7–4.0)
MCH: 26.9 pg (ref 26.0–34.0)
MCHC: 31.2 g/dL (ref 30.0–36.0)
MCV: 86 fL (ref 80.0–100.0)
Monocytes Absolute: 1.3 10*3/uL — ABNORMAL HIGH (ref 0.1–1.0)
Monocytes Relative: 10 %
Neutro Abs: 8.6 10*3/uL — ABNORMAL HIGH (ref 1.7–7.7)
Neutrophils Relative %: 68 %
Platelets: 207 10*3/uL (ref 150–400)
RBC: 3.87 MIL/uL — ABNORMAL LOW (ref 4.22–5.81)
RDW: 17.8 % — ABNORMAL HIGH (ref 11.5–15.5)
WBC: 12.8 10*3/uL — ABNORMAL HIGH (ref 4.0–10.5)
nRBC: 0 % (ref 0.0–0.2)

## 2019-12-31 LAB — COMPREHENSIVE METABOLIC PANEL
ALT: 13 U/L (ref 0–44)
AST: 10 U/L — ABNORMAL LOW (ref 15–41)
Albumin: 3 g/dL — ABNORMAL LOW (ref 3.5–5.0)
Alkaline Phosphatase: 85 U/L (ref 38–126)
Anion gap: 17 — ABNORMAL HIGH (ref 5–15)
BUN: 80 mg/dL — ABNORMAL HIGH (ref 6–20)
CO2: 18 mmol/L — ABNORMAL LOW (ref 22–32)
Calcium: 8.6 mg/dL — ABNORMAL LOW (ref 8.9–10.3)
Chloride: 96 mmol/L — ABNORMAL LOW (ref 98–111)
Creatinine, Ser: 7.19 mg/dL — ABNORMAL HIGH (ref 0.61–1.24)
GFR calc Af Amer: 11 mL/min — ABNORMAL LOW (ref >60)
GFR calc non Af Amer: 9 mL/min — ABNORMAL LOW (ref >60)
Glucose, Bld: 154 mg/dL — ABNORMAL HIGH (ref 70–99)
Potassium: 5.4 mmol/L — ABNORMAL HIGH (ref 3.5–5.1)
Sodium: 131 mmol/L — ABNORMAL LOW (ref 135–145)
Total Bilirubin: 0.6 mg/dL (ref 0.3–1.2)
Total Protein: 6.6 g/dL (ref 6.5–8.1)

## 2019-12-31 LAB — GLUCOSE, CAPILLARY
Glucose-Capillary: 156 mg/dL — ABNORMAL HIGH (ref 70–99)
Glucose-Capillary: 118 mg/dL — ABNORMAL HIGH (ref 70–99)
Glucose-Capillary: 180 mg/dL — ABNORMAL HIGH (ref 70–99)
Glucose-Capillary: 74 mg/dL (ref 70–99)
Glucose-Capillary: 205 mg/dL — ABNORMAL HIGH (ref 70–99)
Glucose-Capillary: 164 mg/dL — ABNORMAL HIGH (ref 70–99)

## 2019-12-31 LAB — PROCALCITONIN: Procalcitonin: 12.53 ng/mL

## 2019-12-31 MED ORDER — EPOETIN ALFA 10000 UNIT/ML IJ SOLN
4000.0000 [IU] | INTRAMUSCULAR | Status: DC
  Administered 2020-01-01: 4000 [IU] via INTRAVENOUS
  Filled 2019-12-31: qty 1

## 2019-12-31 MED ORDER — PIPERACILLIN-TAZOBACTAM IN DEX 2-0.25 GM/50ML IV SOLN
2.2500 g | Freq: Three times a day (TID) | INTRAVENOUS | Status: DC
  Administered 2019-12-31 – 2020-01-09 (×24): 2.25 g via INTRAVENOUS
  Filled 2019-12-31 (×33): qty 50

## 2019-12-31 MED ORDER — COLLAGENASE 250 UNIT/GM EX OINT
TOPICAL_OINTMENT | Freq: Every day | CUTANEOUS | Status: DC
  Administered 2020-01-04 – 2020-01-06 (×2): 1 via TOPICAL
  Filled 2019-12-31: qty 30

## 2019-12-31 MED ORDER — LOPERAMIDE HCL 2 MG PO CAPS
2.0000 mg | ORAL_CAPSULE | Freq: Three times a day (TID) | ORAL | Status: DC | PRN
  Administered 2019-12-31 – 2020-01-09 (×6): 2 mg via ORAL
  Filled 2019-12-31 (×6): qty 1

## 2019-12-31 MED ORDER — MORPHINE SULFATE 15 MG PO TABS
15.0000 mg | ORAL_TABLET | ORAL | Status: DC | PRN
  Administered 2019-12-31 – 2020-01-03 (×12): 15 mg via ORAL
  Filled 2019-12-31 (×12): qty 1

## 2019-12-31 NOTE — Progress Notes (Signed)
[facility]  ROUNDING NOTE   Subjective:   Missed dialysis treatment on Saturday. However rescheduled for today but patient continues to have diarrhea and states he can wait until his regularly scheduled treatment for tomorrow.   Objective:  Vital signs in last 24 hours:  Temp:  [97.5 F (36.4 C)-98.8 F (37.1 C)] 98.2 F (36.8 C) (06/28 1124) Pulse Rate:  [67-75] 73 (06/28 1124) Resp:  [12-20] 18 (06/28 1124) BP: (141-170)/(88-99) 155/93 (06/28 1124) SpO2:  [93 %-98 %] 97 % (06/28 1124) Weight:  [55.7 kg] 55.7 kg (06/28 0500)  Weight change: 3.536 kg Filed Weights   12/29/19 2115 12/30/19 0443 12/31/19 0500  Weight: 52.2 kg 53 kg 55.7 kg    Intake/Output: I/O last 3 completed shifts: In: 467.2 [P.O.:240; IV Piggyback:227.2] Out: -    Intake/Output this shift:  No intake/output data recorded.  Physical Exam: General: NAD,   Head: Normocephalic, atraumatic. Moist oral mucosal membranes  Eyes: Anicteric, PERRL  Neck: Supple, trachea midline  Lungs:  Clear to auscultation  Heart: Regular rate and rhythm  Abdomen:  Soft, nontender,   Extremities:  no peripheral edema. Right BKA  Neurologic: Nonfocal, moving all four extremities  Skin: No lesions  Access: RIJ permcath    Basic Metabolic Panel: Recent Labs  Lab 12/29/19 2121 12/30/19 0500 12/31/19 0605  NA 141 141 131*  K 4.7 5.2* 5.4*  CL 103 102 96*  CO2 22 24 18*  GLUCOSE 126* 66* 154*  BUN 66* 67* 80*  CREATININE 6.07* 6.71* 7.19*  CALCIUM 8.6* 8.4* 8.6*    Liver Function Tests: Recent Labs  Lab 12/30/19 0500 12/31/19 0605  AST 12* 10*  ALT 18 13  ALKPHOS 98 85  BILITOT 0.8 0.6  PROT 6.4* 6.6  ALBUMIN 2.9* 3.0*   No results for input(s): LIPASE, AMYLASE in the last 168 hours. No results for input(s): AMMONIA in the last 168 hours.  CBC: Recent Labs  Lab 12/29/19 2121 12/30/19 0500 12/31/19 0605  WBC 5.5 14.0* 12.8*  NEUTROABS 3.9 12.0* 8.6*  HGB 10.8* 10.0* 10.4*  HCT  36.3* 32.4* 33.3*  MCV 89.9 88.3 86.0  PLT 210 211 207    Cardiac Enzymes: No results for input(s): CKTOTAL, CKMB, CKMBINDEX, TROPONINI in the last 168 hours.  BNP: Invalid input(s): POCBNP  CBG: Recent Labs  Lab 12/30/19 2336 12/31/19 0526 12/31/19 0745 12/31/19 1121 12/31/19 1530  GLUCAP 144* 156* 118* 180* 205*    Microbiology: Results for orders placed or performed during the hospital encounter of 12/29/19  SARS Coronavirus 2 by RT PCR (hospital order, performed in [facility] hospital lab) Nasopharyngeal Nasopharyngeal Swab     Status: None   Collection Time: 12/29/19 10:59 PM   Specimen: Nasopharyngeal Swab  Result Value Ref Range Status   SARS Coronavirus 2 NEGATIVE NEGATIVE Final    Comment: (NOTE) SARS-CoV-2 target nucleic acids are NOT DETECTED.  The SARS-CoV-2 RNA is generally detectable in upper and lower respiratory specimens during the acute phase of infection. The lowest concentration of SARS-CoV-2 viral copies this assay can detect is 250 copies / mL. A negative result does not preclude SARS-CoV-2 infection and should not be used as the sole basis for treatment or other patient management decisions.  A negative result may occur with improper specimen collection / handling, submission of specimen other than nasopharyngeal swab, presence of viral mutation(s) within the areas targeted by this assay, and inadequate number of viral copies (<250 copies / mL). A negative result must  be combined with clinical observations, patient history, and epidemiological information.  Fact Sheet for Patients:   StrictlyIdeas.no  Fact Sheet for Healthcare Providers: BankingDealers.co.za  This test is not yet approved or  cleared by the [place] FDA and has been authorized for detection and/or diagnosis of SARS-CoV-2 by FDA under an Emergency Use Authorization (EUA).  This EUA will remain in effect (meaning this test  can be used) for the duration of the COVID-19 declaration under Section 564(b)(1) of the Act, 21 U.S.C. section 360bbb-3(b)(1), unless the authorization is terminated or revoked sooner.  Performed at [facility], [address] [place], [place] [address]   Blood culture (routine x 2)     Status: None (Preliminary result)   Collection Time: 12/29/19 10:59 PM   Specimen: BLOOD  Result Value Ref Range Status   Specimen Description BLOOD RIGHT ANTECUBITAL  Final   Special Requests   Final    BOTTLES DRAWN AEROBIC AND ANAEROBIC Blood Culture adequate volume   Culture   Final    NO GROWTH 2 DAYS Performed at [facility], [address], [place], [place] [address]    Report Status PENDING  Incomplete  MRSA PCR Screening     Status: None   Collection Time: 12/30/19 12:25 AM   Specimen: Nasopharyngeal  Result Value Ref Range Status   MRSA by PCR NEGATIVE NEGATIVE Final    Comment:        The GeneXpert MRSA Assay (FDA approved for NASAL specimens only), is one component of a comprehensive MRSA colonization surveillance program. It is not intended to diagnose MRSA infection nor to guide or monitor treatment for MRSA infections. Performed at [facility], [place], [place] [address]   C Difficile Quick Screen w PCR reflex     Status: Abnormal   Collection Time: 12/30/19  1:50 PM   Specimen: STOOL  Result Value Ref Range Status   C Diff antigen POSITIVE (A) NEGATIVE Final   C Diff toxin NEGATIVE NEGATIVE Final   C Diff interpretation Results are indeterminate. See PCR results.  Final    Comment: Performed at [facility], [address] [place], [place] [address]  C. Diff by PCR, Reflexed     Status: Abnormal   Collection Time: 12/30/19  1:50 PM  Result Value Ref Range Status   Toxigenic C. Difficile by PCR POSITIVE (A) NEGATIVE Final    Comment: Positive for toxigenic C. difficile with little to no toxin production. Only  treat if clinical presentation suggests symptomatic illness. Performed at [facility], [address] [place], [place] [address]     Coagulation Studies: No results for input(s): LABPROT, INR in the last 72 hours.  Urinalysis: No results for input(s): COLORURINE, LABSPEC, PHURINE, GLUCOSEU, HGBUR, BILIRUBINUR, KETONESUR, PROTEINUR, UROBILINOGEN, NITRITE, LEUKOCYTESUR in the last 72 hours.  Invalid input(s): APPERANCEUR    Imaging: CT HEAD WO CONTRAST  Result Date: 12/30/2019 CLINICAL DATA:  Altered mental status EXAM: CT HEAD WITHOUT CONTRAST TECHNIQUE: Contiguous axial images were obtained from the base of the skull through the vertex without intravenous contrast. COMPARISON:  None. FINDINGS: Brain: No acute intracranial abnormality. Specifically, no hemorrhage, hydrocephalus, mass lesion, acute infarction, or significant intracranial injury. Vascular: No hyperdense vessel or unexpected calcification. Skull: No acute calvarial abnormality. Sinuses/Orbits: Visualized paranasal sinuses and mastoids clear. Orbital soft tissues unremarkable. Other: None IMPRESSION: No acute intracranial abnormality. Electronically Signed   By: Rolm Baptise M.D.   On: 12/30/2019 23:29  CT Chest W Contrast  Result Date: 12/29/2019 CLINICAL DATA:  Chest pain, shortness of breath EXAM: CT CHEST WITH CONTRAST TECHNIQUE: Multidetector CT imaging of the chest was performed during intravenous contrast administration. CONTRAST:  9mL OMNIPAQUE IOHEXOL 300 MG/ML  SOLN COMPARISON:  Radiograph 12/29/2019, CT 07/16/2018 FINDINGS: Cardiovascular: Left IJ approach dual lumen dialysis catheter tip terminates at the of the right atrium. A small amount of gas along the catheter tract likely reflects recent placement of this access. There is mild cardiomegaly with predominantly right heart enlargement. Reflux of contrast into the IVC. Small volume pericardial effusion is present as well though this is similar in size to  the comparison. The aortic root is suboptimally assessed given cardiac pulsation artifact. No acute luminal abnormality of the aorta. Normal 3 vessel branching of the aortic arch. Proximal great vessels are unremarkable. Central pulmonary arteries are mildly enlarged (aorta ratio 1.2). No large central filling defects on this non tailored examination of the pulmonary arteries with more distal evaluation precluded by motion artifact and poor contrast timing. Mediastinum/Nodes: Diffuse edematous changes of the mesentery as well as fluid throughout the pericardial recesses. Stable numerous scattered mediastinal, hilar and bilateral axillary lymph nodes are present. Nodal examples include a 15 mm left axillary node (2/26, a 12 mm right paratracheal node (2/29), and a 12 mm right hilar node (5/67). Many of these appear low-attenuation in could be edematous and or reactive. Acute abnormality of the esophagus. Trachea is unremarkable. No concerning thyroid nodules or masses. Lungs/Pleura: There is a moderate right and moderate to large left pleural effusion. No significant pleural thickening or enhancement is seen. The effusions do appear to extend into the fissures and with some mild left apical capping as well. Lobulation or septation is not fully excluded. There are extensive areas of consolidated lung, some of which is likely related to some passive atelectatic change however more dense areas of focal airspace disease are present throughout both lungs largely in a peribronchovascular distribution. There is extensive regions of architectural distortion as well best demonstrated by reticular changes in the left apex. Opacity in the right upper lobe demonstrates some peripheral cavitation as well (2/63). Areas of hypoattenuating lung parenchyma within these regions of consolidation further supported diagnosis of pneumonia. No pneumothorax. Additional areas of widespread tree-in-bud and ground-glass opacity are noted  throughout both lungs. Interval development of multiple apical bulla/paraseptal emphysematous change not present on comparison from January 2020 Upper Abdomen: Heterogeneously enhancing liver with periportal edema, could reflect some hepatic congestion or volume overload. Mild gallbladder wall thickening is nonspecific in this given setting, possibly edematous though should correlate for right upper quadrant symptoms. No other acute abnormality in the upper abdomen. Musculoskeletal: No acute osseous abnormality or suspicious osseous lesion. Diffuse body wall edema. IMPRESSION: 1. Extensive areas of consolidated lung, some of which is likely related to some passive atelectatic change. Areas of hypoattenuating lung parenchyma within these regions of consolidation further supported diagnosis of pneumonia. 2. There is an area of cavitation right upper lobe associated with a larger consolidative opacity, could reflect a necrotic pneumonia. 3. Moderate right and moderate to large left pleural effusion. Possibly parapneumonic and/or related to patient's volume status however no convincing CT evidence of empyema at this time. 4. Stable numerous scattered mediastinal, hilar and bilateral axillary lymph nodes, some of which appear low-attenuation, likely combination edematous and reactive. 5. Interval development of additional paraseptal emphysematous/bullous changes in the bilateral upper lobes since prior CT. 6. Left IJ approach dual lumen dialysis  catheter tip terminates at the of the right atrium. A small amount of gas along the catheter tract likely compatible with the recent placement of this access. 7. Cardiomegaly right heart enlargement, pulmonary artery enlargement and reflux of contrast into the IVC. Findings could suggest pulmonary hypertension and elevated right heart pressure/right heart failure. 8. Additional features of anasarca/volume overload with diffuse body wall and mediastinal edematous changes.  Periportal edema could suggest some hepatic congestion in the setting of right heart failure. 9. Mild gallbladder wall thickening, nonspecific possibly related to motion or the hepatic congestion. Correlate with right upper quadrant symptoms and consider right upper quadrant ultrasound if clinically warranted. These results were called by telephone at the time of interpretation on 12/29/2019 at 11:00 pm to provider Lenise Arena , who verbally acknowledged these results. Electronically Signed   By: Lovena Le M.D.   On: 12/29/2019 23:01   DG Chest Port 1 View  Result Date: 12/29/2019 CLINICAL DATA:  Unresponsiveness. EXAM: PORTABLE CHEST 1 VIEW COMPARISON:  10/31/2018 FINDINGS: There is a well-positioned tunneled dialysis catheter on the left. The heart size is significantly enlarged and has increased in size from the prior study. There are coarse bilateral airspace opacities with suggestion of a new cavitary lesion in the right mid lung zone. There are probable small bilateral pleural effusions. No definite evidence for pneumothorax. There are few linear lucencies in the patient's low left neck which are favored to represent artifact. IMPRESSION: 1. Persistent bilateral airspace opacities which may represent multifocal pneumonia or sequela of prior infectious process. 2. Apparent new cavitary lesion in the right mid lung zone. Follow-up with a contrast-enhanced CT of the chest is recommended. 3. Significant cardiomegaly with increased in size since prior study. An underlying pericardial effusion should be considered. 4. Well-positioned tunneled dialysis catheter. 5. Small bilateral pleural effusions. 6. Electronically Signed   By: Constance Holster M.D.   On: 12/29/2019 21:34   ECHOCARDIOGRAM COMPLETE  Result Date: 12/30/2019    ECHOCARDIOGRAM REPORT   Patient Name:   Shawn [name] Date of Exam: 12/30/2019 Medical Rec #:  [id number]        Height:       66.0 in Accession #:    [id number]        Weight:       116.8 lb Date of Birth:  1985/08/20        BSA:          1.591 m Patient Age:    57 years         BP:           133/83 mmHg Patient Gender: M                HR:           73 bpm. Exam Location:  [facility] Procedure: 2D Echo Indications:     Acute Respiratory Insufficiency 518.82/R06.89  History:         Patient has prior history of Echocardiogram examinations, most                  recent 10/25/2018.  Sonographer:     [place] Referring Phys:  [id number] Bradly Bienenstock Diagnosing Phys: Mertie Moores MD IMPRESSIONS  1. Left ventricular ejection fraction, by estimation, is 60 to 65%. The left ventricle has normal function. The left ventricle has no regional wall motion abnormalities. There is moderate left ventricular hypertrophy. Left ventricular diastolic parameters were normal.  2. Right ventricular systolic  function is moderately reduced. The right ventricular size is mildly enlarged. There is moderately elevated pulmonary artery systolic pressure. The estimated right ventricular systolic pressure is 69.6 mmHg.  3. Right atrial size was mildly dilated.  4. Moderate pleural effusion.  5. The mitral valve is grossly normal. No evidence of mitral valve regurgitation. No evidence of mitral stenosis.  6. The aortic valve is normal in structure. Aortic valve regurgitation is not visualized. FINDINGS  Left Ventricle: Left ventricular ejection fraction, by estimation, is 60 to 65%. The left ventricle has normal function. The left ventricle has no regional wall motion abnormalities. The left ventricular internal cavity size was normal in size. There is  moderate left ventricular hypertrophy. Left ventricular diastolic parameters were normal. Right Ventricle: The right ventricular size is mildly enlarged. Right vetricular wall thickness was not assessed. Right ventricular systolic function is moderately reduced. There is moderately elevated pulmonary artery systolic pressure. The tricuspid regurgitant  velocity is 3.12 m/s, and with an assumed right atrial pressure of 10 mmHg, the estimated right ventricular systolic pressure is 78.9 mmHg. Left Atrium: Left atrial size was normal in size. Right Atrium: Right atrial size was mildly dilated. Pericardium: A small pericardial effusion is present. Mitral Valve: The mitral valve is grossly normal. No evidence of mitral valve regurgitation. No evidence of mitral valve stenosis. Tricuspid Valve: The tricuspid valve is normal in structure. Tricuspid valve regurgitation is mild. Aortic Valve: The aortic valve is normal in structure. Aortic valve regurgitation is not visualized. Aortic valve peak gradient measures 8.3 mmHg. Pulmonic Valve: The pulmonic valve was grossly normal. Pulmonic valve regurgitation is mild. Aorta: The aortic root and ascending aorta are structurally normal, with no evidence of dilitation. IAS/Shunts: The atrial septum is grossly normal. Additional Comments: There is a moderate pleural effusion.  LEFT VENTRICLE PLAX 2D LVIDd:         4.40 cm  Diastology LVIDs:         3.17 cm  LV e' lateral:   9.79 cm/s LV PW:         1.43 cm  LV E/e' lateral: 6.7 LV IVS:        1.32 cm  LV e' medial:    6.09 cm/s LVOT diam:     2.10 cm  LV E/e' medial:  10.8 LV SV:         64 LV SV Index:   40 LVOT Area:     3.46 cm  RIGHT VENTRICLE RV Basal diam:  3.75 cm RV S prime:     11.90 cm/s TAPSE (M-mode): 1.1 cm LEFT ATRIUM           Index       RIGHT ATRIUM           Index LA diam:      3.50 cm 2.20 cm/m  RA Area:     21.90 cm LA Vol (A2C): 34.4 ml 21.62 ml/m RA Volume:   75.00 ml  47.13 ml/m LA Vol (A4C): 25.2 ml 15.84 ml/m  AORTIC VALVE                PULMONIC VALVE AV Area (Vmax): 2.23 cm    PV Vmax:       0.96 m/s AV Vmax:        144.00 cm/s PV Peak grad:  3.7 mmHg AV Peak Grad:   8.3 mmHg LVOT Vmax:      92.70 cm/s LVOT Vmean:     66.500 cm/s LVOT VTI:  0.186 m  AORTA Ao Root diam: 3.00 cm Ao Asc diam:  3.10 cm MITRAL VALVE               TRICUSPID VALVE MV  Area (PHT): 3.77 cm    TV Peak grad:   33.6 mmHg MV Decel Time: 201 msec    TV Vmax:        2.90 m/s MV E velocity: 66.00 cm/s  TR Peak grad:   38.9 mmHg MV A velocity: 51.40 cm/s  TR Vmax:        312.00 cm/s MV E/A ratio:  1.28                            SHUNTS                            Systemic VTI:  0.19 m                            Systemic Diam: 2.10 cm Mertie Moores MD Electronically signed by Mertie Moores MD Signature Date/Time: 12/30/2019/3:52:07 PM    Final      Medications:   . sodium chloride    . sodium chloride    . piperacillin-tazobactam (ZOSYN)  IV 2.25 g (12/31/19 1549)   . Chlorhexidine Gluconate Cloth  6 each Topical Daily  . collagenase   Topical Daily  . epoetin (EPOGEN/PROCRIT) injection  3,000 Units Subcutaneous Q M,W,F-HD  . insulin aspart  0-6 Units Subcutaneous Q4H   sodium chloride, sodium chloride, alteplase, docusate sodium, heparin, ipratropium-albuterol, lidocaine (PF), lidocaine-prilocaine, loperamide, LORazepam, morphine injection, ondansetron (ZOFRAN) IV, pentafluoroprop-tetrafluoroeth, polyethylene glycol  Assessment/ Plan:  Shawn [name] is a 33 y.o. white male with end stage renal disease on hemodialysis, hypertension, diabetes mellitus, GERD, IBS, osteomyelitis who was admitted to [facility] on 12/29/2019 for End stage renal disease on dialysis ([place]) [N18.6, Z99.2] Acute respiratory failure with hypoxia ([place]) [J96.01]  Now with c. Diff colitis  1. End Stage renal disease: missed dialysis on Saturday. No acute indication for dialysis today.  Schedule dialysis for tomorrow and resume TTS schedule.   2. Hypertension: not currently on any antihypertensive agents.   3. Anemia of chronic kidney disease: hemoglobin 10.4 - EPO with HD treatment.   4. Secondary Hyperparathyroidism:  - Calcium carbonate with meals.     LOS: 2 Ahuva Poynor 6/28/20214:13 PM

## 2019-12-31 NOTE — Progress Notes (Signed)
The patient has 5 loose stools. Imodium has been started.

## 2019-12-31 NOTE — Consult Note (Signed)
[facility] Nurse Consult Note: Reason for Consult:Unstageable coccyx wound  Present on admission.  Wound type:unstageable pressure injury Pressure Injury POA: Yes Measurement: 1.5 cm x 0.3 cm  Wound CMK:LKJZPH fibrin Drainage (amount, consistency, odor) minimal serosanguinous  No odor.  Periwound:intact Dressing procedure/placement/frequency:Cleanse sacrum with NS and pat dry. Apply Santyl to open wound.  Cover with NS moist 2x2 and secure with silicone foam.  Change daily.  Turn and reposition every two hours.  Will not follow at this time.  Please re-consult if needed.  Domenic Moras MSN, RN, FNP-BC CWON Wound, Ostomy, Continence Nurse Pager [phone]

## 2019-12-31 NOTE — Progress Notes (Addendum)
PROGRESS NOTE    Shawn [name]  TIR:[id number] DOB: [birth year] DOA: 12/29/2019 PCP: Valera Castle, MD    Assessment & Plan:   Active Problems:   Acute respiratory failure ([place])    Shawn [name] is a 34 y.o. male with PMH of ESRD on HD admitted 12/29/19 due to Acute Hypoxic Respiratory Failure in the setting of Pulmonary edema/Volume Overload and questionable Necrotizing Pneumonia requiring BiPAP.  Transferred to floor on 6/28.   Acute Hypoxic Respiratory Failure in the setting of Pulmonary Edema/Volume Overload and ? Necrotizing Pneumonia -Supplemental O2 as needed to maintain O2 saturations greater than 88% -Follow intermittent chest x-ray and ABG as needed -Hemodialysis for volume removal -continue zosyn --d/c IV vanc due to neg MRSA screen -As needed bronchodilators -Prn oral Morphine  ESRD on HD TTS Hyperkalemia 2/2 ESRD -iHD per Nephrology   Altered Mental Status>>resolved Hx: Seizures  Diabetes Mellitus -CBG's -SSI  Diarrhea, POA --C diff antigen pos, but toxin neg --No need to treat  --Imodium PRN --continue enteric precaution, per Infection Prevention  Unstageable coccyx wound  Present on admission --wound care consult   DVT prophylaxis: SCD/Compression stockings Code Status: Full code  Family Communication:  Status is: inpatient Dispo:   The patient is from: home Anticipated d/c is to: home Anticipated d/c date is: 3-4 days Patient currently is not medically stable to d/c due to: still on 5L O2, on IV abx for PNA.  Need dialysis for fluid overload.   Subjective and Interval History:  Pt reported feeling weak.  Dyspnea improved, no cough.  No chest pain.  Had abdominal pain and diarrhea PTA but resolved today.     Objective: Vitals:   12/31/19 0529 12/31/19 0748 12/31/19 0749 12/31/19 1124  BP: (!) 141/88 (!) 164/98 (!) 160/92 (!) 155/93  Pulse: 72 72 72 73  Resp: 18 12  18   Temp: 98.2 F (36.8 C) 97.9 F (36.6 C)  98.2  F (36.8 C)  TempSrc: Oral Oral  Oral  SpO2: 93% 98%  97%  Weight:      Height:        Intake/Output Summary (Last 24 hours) at 12/31/2019 2009 Last data filed at 12/31/2019 1850 Gross per 24 hour  Intake 52.52 ml  Output --  Net 52.52 ml   Filed Weights   12/29/19 2115 12/30/19 0443 12/31/19 0500  Weight: 52.2 kg 53 kg 55.7 kg    Examination:   Constitutional: NAD, AAOx3, ill-appearing HEENT: conjunctivae and lids normal, EOMI CV: RRR no M,R,G. Distal pulses +2.  No cyanosis.   RESP: CTA B/L over anterior, normal respiratory effort, on 5L GI: +BS, NTND Extremities: No effusions, edema, or tenderness in LLE.  Right BKA. SKIN: warm, dry and intact Neuro: II - XII grossly intact.  Sensation intact Psych: Depressed mood and affect.     Data Reviewed: I have personally reviewed following labs and imaging studies  CBC: Recent Labs  Lab 12/29/19 2121 12/30/19 0500 12/31/19 0605  WBC 5.5 14.0* 12.8*  NEUTROABS 3.9 12.0* 8.6*  HGB 10.8* 10.0* 10.4*  HCT 36.3* 32.4* 33.3*  MCV 89.9 88.3 86.0  PLT 210 211 676   Basic Metabolic Panel: Recent Labs  Lab 12/29/19 2121 12/30/19 0500 12/31/19 0605  NA 141 141 131*  K 4.7 5.2* 5.4*  CL 103 102 96*  CO2 22 24 18*  GLUCOSE 126* 66* 154*  BUN 66* 67* 80*  CREATININE 6.07* 6.71* 7.19*  CALCIUM 8.6* 8.4* 8.6*   GFR: Estimated  Creatinine Clearance: 11.6 mL/min (A) (by C-G formula based on SCr of 7.19 mg/dL (H)). Liver Function Tests: Recent Labs  Lab 12/30/19 0500 12/31/19 0605  AST 12* 10*  ALT 18 13  ALKPHOS 98 85  BILITOT 0.8 0.6  PROT 6.4* 6.6  ALBUMIN 2.9* 3.0*   No results for input(s): LIPASE, AMYLASE in the last 168 hours. No results for input(s): AMMONIA in the last 168 hours. Coagulation Profile: No results for input(s): INR, PROTIME in the last 168 hours. Cardiac Enzymes: No results for input(s): CKTOTAL, CKMB, CKMBINDEX, TROPONINI in the last 168 hours. BNP (last 3 results) No results for  input(s): PROBNP in the last 8760 hours. HbA1C: Recent Labs    12/30/19 0041  HGBA1C 7.4*   CBG: Recent Labs  Lab 12/30/19 2336 12/31/19 0526 12/31/19 0745 12/31/19 1121 12/31/19 1530  GLUCAP 144* 156* 118* 180* 205*   Lipid Profile: No results for input(s): CHOL, HDL, LDLCALC, TRIG, CHOLHDL, LDLDIRECT in the last 72 hours. Thyroid Function Tests: No results for input(s): TSH, T4TOTAL, FREET4, T3FREE, THYROIDAB in the last 72 hours. Anemia Panel: No results for input(s): VITAMINB12, FOLATE, FERRITIN, TIBC, IRON, RETICCTPCT in the last 72 hours. Sepsis Labs: Recent Labs  Lab 12/29/19 2259 12/29/19 2300 12/30/19 0041 12/30/19 0500 12/31/19 0605  PROCALCITON  --  0.35  --  2.29 12.53  LATICACIDVEN 1.0  --  0.9  --   --     Recent Results (from the past 240 hour(s))  SARS Coronavirus 2 by RT PCR (hospital order, performed in [facility] hospital lab) Nasopharyngeal Nasopharyngeal Swab     Status: None   Collection Time: 12/29/19 10:59 PM   Specimen: Nasopharyngeal Swab  Result Value Ref Range Status   SARS Coronavirus 2 NEGATIVE NEGATIVE Final    Comment: (NOTE) SARS-CoV-2 target nucleic acids are NOT DETECTED.  The SARS-CoV-2 RNA is generally detectable in upper and lower respiratory specimens during the acute phase of infection. The lowest concentration of SARS-CoV-2 viral copies this assay can detect is 250 copies / mL. A negative result does not preclude SARS-CoV-2 infection and should not be used as the sole basis for treatment or other patient management decisions.  A negative result may occur with improper specimen collection / handling, submission of specimen other than nasopharyngeal swab, presence of viral mutation(s) within the areas targeted by this assay, and inadequate number of viral copies (<250 copies / mL). A negative result must be combined with clinical observations, patient history, and epidemiological information.  Fact Sheet for Patients:     StrictlyIdeas.no  Fact Sheet for Healthcare Providers: BankingDealers.co.za  This test is not yet approved or  cleared by the [place] FDA and has been authorized for detection and/or diagnosis of SARS-CoV-2 by FDA under an Emergency Use Authorization (EUA).  This EUA will remain in effect (meaning this test can be used) for the duration of the COVID-19 declaration under Section 564(b)(1) of the Act, 21 U.S.C. section 360bbb-3(b)(1), unless the authorization is terminated or revoked sooner.  Performed at [facility], [address] [place], [place] [address]   Blood culture (routine x 2)     Status: None (Preliminary result)   Collection Time: 12/29/19 10:59 PM   Specimen: BLOOD  Result Value Ref Range Status   Specimen Description BLOOD RIGHT ANTECUBITAL  Final   Special Requests   Final    BOTTLES DRAWN AEROBIC AND ANAEROBIC Blood Culture adequate volume   Culture   Final    NO  GROWTH 2 DAYS Performed at [facility], [address] [place], [place] [address]    Report Status PENDING  Incomplete  MRSA PCR Screening     Status: None   Collection Time: 12/30/19 12:25 AM   Specimen: Nasopharyngeal  Result Value Ref Range Status   MRSA by PCR NEGATIVE NEGATIVE Final    Comment:        The GeneXpert MRSA Assay (FDA approved for NASAL specimens only), is one component of a comprehensive MRSA colonization surveillance program. It is not intended to diagnose MRSA infection nor to guide or monitor treatment for MRSA infections. Performed at [facility], [address], [place] [address]   C Difficile Quick Screen w PCR reflex     Status: Abnormal   Collection Time: 12/30/19  1:50 PM   Specimen: STOOL  Result Value Ref Range Status   C Diff antigen POSITIVE (A) NEGATIVE Final   C Diff toxin NEGATIVE NEGATIVE Final   C Diff interpretation Results are indeterminate. See PCR  results.  Final    Comment: Performed at [facility], [address] [place], [place] [address]  C. Diff by PCR, Reflexed     Status: Abnormal   Collection Time: 12/30/19  1:50 PM  Result Value Ref Range Status   Toxigenic C. Difficile by PCR POSITIVE (A) NEGATIVE Final    Comment: Positive for toxigenic C. difficile with little to no toxin production. Only treat if clinical presentation suggests symptomatic illness. Performed at [facility], [address] [place], [place] [address]       Radiology Studies: CT HEAD WO CONTRAST  Result Date: 12/30/2019 CLINICAL DATA:  Altered mental status EXAM: CT HEAD WITHOUT CONTRAST TECHNIQUE: Contiguous axial images were obtained from the base of the skull through the vertex without intravenous contrast. COMPARISON:  None. FINDINGS: Brain: No acute intracranial abnormality. Specifically, no hemorrhage, hydrocephalus, mass lesion, acute infarction, or significant intracranial injury. Vascular: No hyperdense vessel or unexpected calcification. Skull: No acute calvarial abnormality. Sinuses/Orbits: Visualized paranasal sinuses and mastoids clear. Orbital soft tissues unremarkable. Other: None IMPRESSION: No acute intracranial abnormality. Electronically Signed   By: Rolm Baptise M.D.   On: 12/30/2019 23:29   CT Chest W Contrast  Result Date: 12/29/2019 CLINICAL DATA:  Chest pain, shortness of breath EXAM: CT CHEST WITH CONTRAST TECHNIQUE: Multidetector CT imaging of the chest was performed during intravenous contrast administration. CONTRAST:  56mL OMNIPAQUE IOHEXOL 300 MG/ML  SOLN COMPARISON:  Radiograph 12/29/2019, CT 07/16/2018 FINDINGS: Cardiovascular: Left IJ approach dual lumen dialysis catheter tip terminates at the of the right atrium. A small amount of gas along the catheter tract likely reflects recent placement of this access. There is mild cardiomegaly with predominantly right heart enlargement. Reflux of contrast into the  IVC. Small volume pericardial effusion is present as well though this is similar in size to the comparison. The aortic root is suboptimally assessed given cardiac pulsation artifact. No acute luminal abnormality of the aorta. Normal 3 vessel branching of the aortic arch. Proximal great vessels are unremarkable. Central pulmonary arteries are mildly enlarged (aorta ratio 1.2). No large central filling defects on this non tailored examination of the pulmonary arteries with more distal evaluation precluded by motion artifact and poor contrast timing. Mediastinum/Nodes: Diffuse edematous changes of the mesentery as well as fluid throughout the pericardial recesses. Stable numerous scattered mediastinal, hilar and bilateral axillary lymph nodes are present. Nodal examples include a 15 mm left axillary node (2/26, a  12 mm right paratracheal node (2/29), and a 12 mm right hilar node (5/67). Many of these appear low-attenuation in could be edematous and or reactive. Acute abnormality of the esophagus. Trachea is unremarkable. No concerning thyroid nodules or masses. Lungs/Pleura: There is a moderate right and moderate to large left pleural effusion. No significant pleural thickening or enhancement is seen. The effusions do appear to extend into the fissures and with some mild left apical capping as well. Lobulation or septation is not fully excluded. There are extensive areas of consolidated lung, some of which is likely related to some passive atelectatic change however more dense areas of focal airspace disease are present throughout both lungs largely in a peribronchovascular distribution. There is extensive regions of architectural distortion as well best demonstrated by reticular changes in the left apex. Opacity in the right upper lobe demonstrates some peripheral cavitation as well (2/63). Areas of hypoattenuating lung parenchyma within these regions of consolidation further supported diagnosis of pneumonia. No  pneumothorax. Additional areas of widespread tree-in-bud and ground-glass opacity are noted throughout both lungs. Interval development of multiple apical bulla/paraseptal emphysematous change not present on comparison from January 2020 Upper Abdomen: Heterogeneously enhancing liver with periportal edema, could reflect some hepatic congestion or volume overload. Mild gallbladder wall thickening is nonspecific in this given setting, possibly edematous though should correlate for right upper quadrant symptoms. No other acute abnormality in the upper abdomen. Musculoskeletal: No acute osseous abnormality or suspicious osseous lesion. Diffuse body wall edema. IMPRESSION: 1. Extensive areas of consolidated lung, some of which is likely related to some passive atelectatic change. Areas of hypoattenuating lung parenchyma within these regions of consolidation further supported diagnosis of pneumonia. 2. There is an area of cavitation right upper lobe associated with a larger consolidative opacity, could reflect a necrotic pneumonia. 3. Moderate right and moderate to large left pleural effusion. Possibly parapneumonic and/or related to patient's volume status however no convincing CT evidence of empyema at this time. 4. Stable numerous scattered mediastinal, hilar and bilateral axillary lymph nodes, some of which appear low-attenuation, likely combination edematous and reactive. 5. Interval development of additional paraseptal emphysematous/bullous changes in the bilateral upper lobes since prior CT. 6. Left IJ approach dual lumen dialysis catheter tip terminates at the of the right atrium. A small amount of gas along the catheter tract likely compatible with the recent placement of this access. 7. Cardiomegaly right heart enlargement, pulmonary artery enlargement and reflux of contrast into the IVC. Findings could suggest pulmonary hypertension and elevated right heart pressure/right heart failure. 8. Additional features of  anasarca/volume overload with diffuse body wall and mediastinal edematous changes. Periportal edema could suggest some hepatic congestion in the setting of right heart failure. 9. Mild gallbladder wall thickening, nonspecific possibly related to motion or the hepatic congestion. Correlate with right upper quadrant symptoms and consider right upper quadrant ultrasound if clinically warranted. These results were called by telephone at the time of interpretation on 12/29/2019 at 11:00 pm to provider Lenise Arena , who verbally acknowledged these results. Electronically Signed   By: Lovena Le M.D.   On: 12/29/2019 23:01   DG Chest Port 1 View  Result Date: 12/29/2019 CLINICAL DATA:  Unresponsiveness. EXAM: PORTABLE CHEST 1 VIEW COMPARISON:  10/31/2018 FINDINGS: There is a well-positioned tunneled dialysis catheter on the left. The heart size is significantly enlarged and has increased in size from the prior study. There are coarse bilateral airspace opacities with suggestion of a new cavitary lesion in the right mid  lung zone. There are probable small bilateral pleural effusions. No definite evidence for pneumothorax. There are few linear lucencies in the patient's low left neck which are favored to represent artifact. IMPRESSION: 1. Persistent bilateral airspace opacities which may represent multifocal pneumonia or sequela of prior infectious process. 2. Apparent new cavitary lesion in the right mid lung zone. Follow-up with a contrast-enhanced CT of the chest is recommended. 3. Significant cardiomegaly with increased in size since prior study. An underlying pericardial effusion should be considered. 4. Well-positioned tunneled dialysis catheter. 5. Small bilateral pleural effusions. 6. Electronically Signed   By: Constance Holster M.D.   On: 12/29/2019 21:34   ECHOCARDIOGRAM COMPLETE  Result Date: 12/30/2019    ECHOCARDIOGRAM REPORT   Patient Name:   Shawn [name] Date of Exam: 12/30/2019 Medical Rec  #:  [id number]        Height:       66.0 in Accession #:    [id number]       Weight:       116.8 lb Date of Birth:  20-Mar-1986        BSA:          1.591 m Patient Age:    27 years         BP:           133/83 mmHg Patient Gender: M                HR:           73 bpm. Exam Location:  [facility] Procedure: 2D Echo Indications:     Acute Respiratory Insufficiency 518.82/R06.89  History:         Patient has prior history of Echocardiogram examinations, most                  recent 10/25/2018.  Sonographer:     [place] Referring Phys:  [id number] Bradly Bienenstock Diagnosing Phys: Mertie Moores MD IMPRESSIONS  1. Left ventricular ejection fraction, by estimation, is 60 to 65%. The left ventricle has normal function. The left ventricle has no regional wall motion abnormalities. There is moderate left ventricular hypertrophy. Left ventricular diastolic parameters were normal.  2. Right ventricular systolic function is moderately reduced. The right ventricular size is mildly enlarged. There is moderately elevated pulmonary artery systolic pressure. The estimated right ventricular systolic pressure is 84.1 mmHg.  3. Right atrial size was mildly dilated.  4. Moderate pleural effusion.  5. The mitral valve is grossly normal. No evidence of mitral valve regurgitation. No evidence of mitral stenosis.  6. The aortic valve is normal in structure. Aortic valve regurgitation is not visualized. FINDINGS  Left Ventricle: Left ventricular ejection fraction, by estimation, is 60 to 65%. The left ventricle has normal function. The left ventricle has no regional wall motion abnormalities. The left ventricular internal cavity size was normal in size. There is  moderate left ventricular hypertrophy. Left ventricular diastolic parameters were normal. Right Ventricle: The right ventricular size is mildly enlarged. Right vetricular wall thickness was not assessed. Right ventricular systolic function is moderately reduced. There is  moderately elevated pulmonary artery systolic pressure. The tricuspid regurgitant velocity is 3.12 m/s, and with an assumed right atrial pressure of 10 mmHg, the estimated right ventricular systolic pressure is 66.0 mmHg. Left Atrium: Left atrial size was normal in size. Right Atrium: Right atrial size was mildly dilated. Pericardium: A small pericardial effusion is present. Mitral Valve: The mitral valve is grossly normal. No  evidence of mitral valve regurgitation. No evidence of mitral valve stenosis. Tricuspid Valve: The tricuspid valve is normal in structure. Tricuspid valve regurgitation is mild. Aortic Valve: The aortic valve is normal in structure. Aortic valve regurgitation is not visualized. Aortic valve peak gradient measures 8.3 mmHg. Pulmonic Valve: The pulmonic valve was grossly normal. Pulmonic valve regurgitation is mild. Aorta: The aortic root and ascending aorta are structurally normal, with no evidence of dilitation. IAS/Shunts: The atrial septum is grossly normal. Additional Comments: There is a moderate pleural effusion.  LEFT VENTRICLE PLAX 2D LVIDd:         4.40 cm  Diastology LVIDs:         3.17 cm  LV e' lateral:   9.79 cm/s LV PW:         1.43 cm  LV E/e' lateral: 6.7 LV IVS:        1.32 cm  LV e' medial:    6.09 cm/s LVOT diam:     2.10 cm  LV E/e' medial:  10.8 LV SV:         64 LV SV Index:   40 LVOT Area:     3.46 cm  RIGHT VENTRICLE RV Basal diam:  3.75 cm RV S prime:     11.90 cm/s TAPSE (M-mode): 1.1 cm LEFT ATRIUM           Index       RIGHT ATRIUM           Index LA diam:      3.50 cm 2.20 cm/m  RA Area:     21.90 cm LA Vol (A2C): 34.4 ml 21.62 ml/m RA Volume:   75.00 ml  47.13 ml/m LA Vol (A4C): 25.2 ml 15.84 ml/m  AORTIC VALVE                PULMONIC VALVE AV Area (Vmax): 2.23 cm    PV Vmax:       0.96 m/s AV Vmax:        144.00 cm/s PV Peak grad:  3.7 mmHg AV Peak Grad:   8.3 mmHg LVOT Vmax:      92.70 cm/s LVOT Vmean:     66.500 cm/s LVOT VTI:       0.186 m  AORTA Ao Root  diam: 3.00 cm Ao Asc diam:  3.10 cm MITRAL VALVE               TRICUSPID VALVE MV Area (PHT): 3.77 cm    TV Peak grad:   33.6 mmHg MV Decel Time: 201 msec    TV Vmax:        2.90 m/s MV E velocity: 66.00 cm/s  TR Peak grad:   38.9 mmHg MV A velocity: 51.40 cm/s  TR Vmax:        312.00 cm/s MV E/A ratio:  1.28                            SHUNTS                            Systemic VTI:  0.19 m                            Systemic Diam: 2.10 cm Mertie Moores MD Electronically signed by Mertie Moores MD Signature Date/Time: 12/30/2019/3:52:07 PM    Final  Scheduled Meds: . Chlorhexidine Gluconate Cloth  6 each Topical Daily  . collagenase   Topical Daily  . [START ON 01/01/2020] epoetin (EPOGEN/PROCRIT) injection  4,000 Units Intravenous Q T,Th,Sa-HD  . insulin aspart  0-6 Units Subcutaneous Q4H   Continuous Infusions: . sodium chloride    . sodium chloride    . piperacillin-tazobactam (ZOSYN)  IV Stopped (12/31/19 1627)     LOS: 2 days     Enzo Bi, MD [facility] If 7PM-7AM, please contact night-coverage 12/31/2019, 8:09 PM

## 2020-01-01 DIAGNOSIS — J984 Other disorders of lung: Secondary | ICD-10-CM

## 2020-01-01 DIAGNOSIS — L89159 Pressure ulcer of sacral region, unspecified stage: Secondary | ICD-10-CM | POA: Diagnosis present

## 2020-01-01 DIAGNOSIS — N186 End stage renal disease: Secondary | ICD-10-CM

## 2020-01-01 DIAGNOSIS — J850 Gangrene and necrosis of lung: Secondary | ICD-10-CM | POA: Diagnosis present

## 2020-01-01 LAB — GLUCOSE, CAPILLARY
Glucose-Capillary: 116 mg/dL — ABNORMAL HIGH (ref 70–99)
Glucose-Capillary: 116 mg/dL — ABNORMAL HIGH (ref 70–99)
Glucose-Capillary: 121 mg/dL — ABNORMAL HIGH (ref 70–99)
Glucose-Capillary: 126 mg/dL — ABNORMAL HIGH (ref 70–99)
Glucose-Capillary: 150 mg/dL — ABNORMAL HIGH (ref 70–99)
Glucose-Capillary: 186 mg/dL — ABNORMAL HIGH (ref 70–99)
Glucose-Capillary: 186 mg/dL — ABNORMAL HIGH (ref 70–99)
Glucose-Capillary: 187 mg/dL — ABNORMAL HIGH (ref 70–99)

## 2020-01-01 LAB — CBC
HCT: 33.2 % — ABNORMAL LOW (ref 39.0–52.0)
HCT: 32.9 % — ABNORMAL LOW (ref 39.0–52.0)
Hemoglobin: 10 g/dL — ABNORMAL LOW (ref 13.0–17.0)
Hemoglobin: 10 g/dL — ABNORMAL LOW (ref 13.0–17.0)
MCH: 26.4 pg (ref 26.0–34.0)
MCH: 26.8 pg (ref 26.0–34.0)
MCHC: 30.1 g/dL (ref 30.0–36.0)
MCHC: 30.4 g/dL (ref 30.0–36.0)
MCV: 87.6 fL (ref 80.0–100.0)
MCV: 88.2 fL (ref 80.0–100.0)
Platelets: 182 10*3/uL (ref 150–400)
Platelets: 192 10*3/uL (ref 150–400)
RBC: 3.79 MIL/uL — ABNORMAL LOW (ref 4.22–5.81)
RBC: 3.73 MIL/uL — ABNORMAL LOW (ref 4.22–5.81)
RDW: 17.3 % — ABNORMAL HIGH (ref 11.5–15.5)
RDW: 17.3 % — ABNORMAL HIGH (ref 11.5–15.5)
WBC: 10.4 10*3/uL (ref 4.0–10.5)
WBC: 10.8 10*3/uL — ABNORMAL HIGH (ref 4.0–10.5)
nRBC: 0 % (ref 0.0–0.2)
nRBC: 0 % (ref 0.0–0.2)

## 2020-01-01 LAB — BASIC METABOLIC PANEL
Anion gap: 17 — ABNORMAL HIGH (ref 5–15)
BUN: 87 mg/dL — ABNORMAL HIGH (ref 6–20)
CO2: 17 mmol/L — ABNORMAL LOW (ref 22–32)
Calcium: 8.2 mg/dL — ABNORMAL LOW (ref 8.9–10.3)
Chloride: 98 mmol/L (ref 98–111)
Creatinine, Ser: 8.23 mg/dL — ABNORMAL HIGH (ref 0.61–1.24)
GFR calc Af Amer: 9 mL/min — ABNORMAL LOW (ref >60)
GFR calc non Af Amer: 8 mL/min — ABNORMAL LOW (ref >60)
Glucose, Bld: 124 mg/dL — ABNORMAL HIGH (ref 70–99)
Potassium: 5.8 mmol/L — ABNORMAL HIGH (ref 3.5–5.1)
Sodium: 132 mmol/L — ABNORMAL LOW (ref 135–145)

## 2020-01-01 LAB — PHOSPHORUS: Phosphorus: 8.3 mg/dL — ABNORMAL HIGH (ref 2.5–4.6)

## 2020-01-01 LAB — MAGNESIUM: Magnesium: 1.9 mg/dL (ref 1.7–2.4)

## 2020-01-01 LAB — PROCALCITONIN: Procalcitonin: 12.89 ng/mL

## 2020-01-01 MED ORDER — HEPARIN SODIUM (PORCINE) 5000 UNIT/ML IJ SOLN: 5000.0000 [IU] | Freq: Three times a day (TID) | INTRAMUSCULAR | Status: DC

## 2020-01-01 NOTE — Progress Notes (Signed)
Could not wean off oxygen on room air oxygen dropped to 50%. On 3L current oxygen level is 92%

## 2020-01-01 NOTE — Progress Notes (Signed)
Pre HD Assessment    01/01/20 1844  Neurological  Level of Consciousness Alert  Respiratory  Respiratory Pattern Regular;Unlabored  Chest Assessment Chest expansion symmetrical  Bilateral Breath Sounds Diminished

## 2020-01-01 NOTE — Progress Notes (Signed)
HD Initiated     01/01/20 1900  Vital Signs  Temp 98.2 F (36.8 C)  Temp Source Oral  Pulse Rate 75  Pulse Rate Source Dinamap  Resp 14  BP (!) 146/94  BP Location Left Arm  BP Method Automatic  Patient Position (if appropriate) Lying  Oxygen Therapy  SpO2 96 %  O2 Device Nasal Cannula  O2 Flow Rate (L/min) 3 L/min  During Hemodialysis Assessment  Blood Flow Rate (mL/min) 275 mL/min  Arterial Pressure (mmHg) -90 mmHg  Venous Pressure (mmHg) 60 mmHg  Transmembrane Pressure (mmHg) 80 mmHg  Ultrafiltration Rate (mL/min) 320 mL/min  Dialysate Flow Rate (mL/min) 600 ml/min  Conductivity: Machine  13.7  HD Safety Checks Performed Yes  Dialysis Fluid Bolus Normal Saline  Bolus Amount (mL) 250 mL  Intra-Hemodialysis Comments Tx initiated

## 2020-01-01 NOTE — Progress Notes (Signed)
[facility]  ROUNDING NOTE   Subjective:   Dialysis for later today.   Diarrhea is reported to be improving.   Patient wants a regular diet. Currently drinking orange juice. Potassium 5.8  Objective:  Vital signs in last 24 hours:  Temp:  [98.1 F (36.7 C)-98.6 F (37 C)] 98.2 F (36.8 C) (06/29 1135) Pulse Rate:  [79-83] 79 (06/29 1135) Resp:  [18] 18 (06/28 2022) BP: (133-168)/(87-96) 133/90 (06/29 1135) SpO2:  [50 %-97 %] 96 % (06/29 1135) Weight:  [55.8 kg] 55.8 kg (06/29 0500)  Weight change: 0.1 kg Filed Weights   12/30/19 0443 12/31/19 0500 01/01/20 0500  Weight: 53 kg 55.7 kg 55.8 kg    Intake/Output: I/O last 3 completed shifts: In: 108 [IV Piggyback:108] Out: -    Intake/Output this shift:  No intake/output data recorded.  Physical Exam: General: NAD,   Head: Normocephalic, atraumatic. Moist oral mucosal membranes  Eyes: Anicteric, PERRL  Neck: Supple, trachea midline  Lungs:  Clear to auscultation  Heart: Regular rate and rhythm  Abdomen:  Soft, nontender,   Extremities:  no peripheral edema. Right BKA  Neurologic: Nonfocal, moving all four extremities  Skin: No lesions  Access: RIJ permcath    Basic Metabolic Panel: Recent Labs  Lab 12/29/19 2121 12/29/19 2121 12/30/19 0500 12/31/19 0605 01/01/20 0507  NA 141  --  141 131* 132*  K 4.7  --  5.2* 5.4* 5.8*  CL 103  --  102 96* 98  CO2 22  --  24 18* 17*  GLUCOSE 126*  --  66* 154* 124*  BUN 66*  --  67* 80* 87*  CREATININE 6.07*  --  6.71* 7.19* 8.23*  CALCIUM 8.6*   < > 8.4* 8.6* 8.2*  MG  --   --   --   --  1.9  PHOS  --   --   --   --  8.3*   < > = values in this interval not displayed.    Liver Function Tests: Recent Labs  Lab 12/30/19 0500 12/31/19 0605  AST 12* 10*  ALT 18 13  ALKPHOS 98 85  BILITOT 0.8 0.6  PROT 6.4* 6.6  ALBUMIN 2.9* 3.0*   No results for input(s): LIPASE, AMYLASE in the last 168 hours. No results for input(s): AMMONIA in the last 168  hours.  CBC: Recent Labs  Lab 12/29/19 2121 12/30/19 0500 12/31/19 0605 01/01/20 0507 01/01/20 0950  WBC 5.5 14.0* 12.8* 10.4 10.8*  NEUTROABS 3.9 12.0* 8.6*  --   --   HGB 10.8* 10.0* 10.4* 10.0* 10.0*  HCT 36.3* 32.4* 33.3* 33.2* 32.9*  MCV 89.9 88.3 86.0 87.6 88.2  PLT 210 211 207 182 192    Cardiac Enzymes: No results for input(s): CKTOTAL, CKMB, CKMBINDEX, TROPONINI in the last 168 hours.  BNP: Invalid input(s): POCBNP  CBG: Recent Labs  Lab 12/31/19 2052 01/01/20 0051 01/01/20 0500 01/01/20 0804 01/01/20 1134  GLUCAP 164* 186* 126* 121* 150*    Microbiology: Results for orders placed or performed during the hospital encounter of 12/29/19  SARS Coronavirus 2 by RT PCR (hospital order, performed in [facility] hospital lab) Nasopharyngeal Nasopharyngeal Swab     Status: None   Collection Time: 12/29/19 10:59 PM   Specimen: Nasopharyngeal Swab  Result Value Ref Range Status   SARS Coronavirus 2 NEGATIVE NEGATIVE Final    Comment: (NOTE) SARS-CoV-2 target nucleic acids are NOT DETECTED.  The SARS-CoV-2 RNA is generally detectable in upper  and lower respiratory specimens during the acute phase of infection. The lowest concentration of SARS-CoV-2 viral copies this assay can detect is 250 copies / mL. A negative result does not preclude SARS-CoV-2 infection and should not be used as the sole basis for treatment or other patient management decisions.  A negative result may occur with improper specimen collection / handling, submission of specimen other than nasopharyngeal swab, presence of viral mutation(s) within the areas targeted by this assay, and inadequate number of viral copies (<250 copies / mL). A negative result must be combined with clinical observations, patient history, and epidemiological information.  Fact Sheet for Patients:   StrictlyIdeas.no  Fact Sheet for Healthcare  Providers: BankingDealers.co.za  This test is not yet approved or  cleared by the [place] FDA and has been authorized for detection and/or diagnosis of SARS-CoV-2 by FDA under an Emergency Use Authorization (EUA).  This EUA will remain in effect (meaning this test can be used) for the duration of the COVID-19 declaration under Section 564(b)(1) of the Act, 21 U.S.C. section 360bbb-3(b)(1), unless the authorization is terminated or revoked sooner.  Performed at [facility], [address] [place], [place] [address]   Blood culture (routine x 2)     Status: None (Preliminary result)   Collection Time: 12/29/19 10:59 PM   Specimen: BLOOD  Result Value Ref Range Status   Specimen Description BLOOD RIGHT ANTECUBITAL  Final   Special Requests   Final    BOTTLES DRAWN AEROBIC AND ANAEROBIC Blood Culture adequate volume   Culture   Final    NO GROWTH 3 DAYS Performed at [facility], [address] [place], [place] [address]    Report Status PENDING  Incomplete  MRSA PCR Screening     Status: None   Collection Time: 12/30/19 12:25 AM   Specimen: Nasopharyngeal  Result Value Ref Range Status   MRSA by PCR NEGATIVE NEGATIVE Final    Comment:        The GeneXpert MRSA Assay (FDA approved for NASAL specimens only), is one component of a comprehensive MRSA colonization surveillance program. It is not intended to diagnose MRSA infection nor to guide or monitor treatment for MRSA infections. Performed at [facility], [place], [place] [address]   C Difficile Quick Screen w PCR reflex     Status: Abnormal   Collection Time: 12/30/19  1:50 PM   Specimen: STOOL  Result Value Ref Range Status   C Diff antigen POSITIVE (A) NEGATIVE Final   C Diff toxin NEGATIVE NEGATIVE Final   C Diff interpretation Results are indeterminate. See PCR results.  Final    Comment: Performed at [facility], [address] [place], [place] [address]  C. Diff by PCR, Reflexed     Status: Abnormal   Collection Time: 12/30/19  1:50 PM  Result Value Ref Range Status   Toxigenic C. Difficile by PCR POSITIVE (A) NEGATIVE Final    Comment: Positive for toxigenic C. difficile with little to no toxin production. Only treat if clinical presentation suggests symptomatic illness. Performed at [facility], [address] [place],  [address]     Coagulation Studies: No results for input(s): LABPROT, INR in the last 72 hours.  Urinalysis: No results for input(s): COLORURINE, LABSPEC, PHURINE, GLUCOSEU, HGBUR, BILIRUBINUR, KETONESUR, PROTEINUR, UROBILINOGEN, NITRITE, LEUKOCYTESUR in the last 72 hours.  Invalid input(s): APPERANCEUR    Imaging: CT HEAD WO CONTRAST  Result Date: 12/30/2019 CLINICAL  DATA:  Altered mental status EXAM: CT HEAD WITHOUT CONTRAST TECHNIQUE: Contiguous axial images were obtained from the base of the skull through the vertex without intravenous contrast. COMPARISON:  None. FINDINGS: Brain: No acute intracranial abnormality. Specifically, no hemorrhage, hydrocephalus, mass lesion, acute infarction, or significant intracranial injury. Vascular: No hyperdense vessel or unexpected calcification. Skull: No acute calvarial abnormality. Sinuses/Orbits: Visualized paranasal sinuses and mastoids clear. Orbital soft tissues unremarkable. Other: None IMPRESSION: No acute intracranial abnormality. Electronically Signed   By: Rolm Baptise M.D.   On: 12/30/2019 23:29     Medications:   . sodium chloride    . sodium chloride    . piperacillin-tazobactam (ZOSYN)  IV 2.25 g (01/01/20 1251)   . Chlorhexidine Gluconate Cloth  6 each Topical Daily  . collagenase   Topical Daily  . epoetin (EPOGEN/PROCRIT) injection  4,000 Units Intravenous Q T,Th,Sa-HD  . insulin aspart  0-6 Units Subcutaneous Q4H   sodium chloride, sodium chloride, alteplase, docusate sodium, heparin,  ipratropium-albuterol, lidocaine (PF), lidocaine-prilocaine, loperamide, LORazepam, morphine, ondansetron (ZOFRAN) IV, pentafluoroprop-tetrafluoroeth, polyethylene glycol  Assessment/ Plan:  [name] is a 34 y.o. white male with end stage renal disease on hemodialysis, hypertension, diabetes mellitus, GERD, IBS, osteomyelitis who was admitted to [facility] on 12/29/2019 for End stage renal disease on dialysis ([place]) [N18.6, Z99.2] Acute respiratory failure with hypoxia ([place]) [J96.01]  Now with c. Diff colitis  1. End Stage renal disease: missed dialysis on Saturday.  Dialysis for later today.  Resume TTS schedule.   2. Hypertension: not currently on any antihypertensive agents.   3. Anemia of chronic kidney disease: hemoglobin 10 - EPO with HD treatment.   4. Secondary Hyperparathyroidism:  - Calcium carbonate with meals.    LOS: 3 Shanikka Wonders 6/29/20214:10 PM

## 2020-01-01 NOTE — Progress Notes (Signed)
HD Complete   01/01/20 2200  Vital Signs  Temp 98 F (36.7 C)  Temp Source Oral  Pulse Rate 85  Resp 17  BP (!) 175/101  BP Location Left Arm  BP Method Automatic  Patient Position (if appropriate) Lying  Oxygen Therapy  SpO2 97 %  O2 Device Nasal Cannula  O2 Flow Rate (L/min) 3 L/min  During Hemodialysis Assessment  HD Safety Checks Performed Yes  KECN 63.3 KECN  Dialysis Fluid Bolus Normal Saline  Bolus Amount (mL) 250 mL  Intra-Hemodialysis Comments Tx completed  Post-Hemodialysis Assessment  Rinseback Volume (mL) 250 mL  KECN 63.3 V  Dialyzer Clearance Clear  Duration of HD Treatment -hour(s) 3 hour(s)  Hemodialysis Intake (mL) 500 mL  UF Total -Machine (mL) 931 mL  Net UF (mL) 431 mL  Tolerated HD Treatment Yes

## 2020-01-01 NOTE — Progress Notes (Signed)
Post HD    01/01/20 2200  Vital Signs  Temp 98 F (36.7 C)  Temp Source Oral  Pulse Rate 85  Resp 17  BP (!) 175/101  BP Location Left Arm  BP Method Automatic  Patient Position (if appropriate) Lying  Oxygen Therapy  SpO2 97 %  O2 Device Nasal Cannula  O2 Flow Rate (L/min) 3 L/min  During Hemodialysis Assessment  HD Safety Checks Performed Yes  KECN 63.3 KECN  Dialysis Fluid Bolus Normal Saline  Bolus Amount (mL) 250 mL  Intra-Hemodialysis Comments Tx completed  Post-Hemodialysis Assessment  Rinseback Volume (mL) 250 mL  KECN 63.3 V  Dialyzer Clearance Clear  Duration of HD Treatment -hour(s) 3 hour(s)  Hemodialysis Intake (mL) 500 mL  UF Total -Machine (mL) 931 mL  Net UF (mL) 431 mL  Tolerated HD Treatment Yes

## 2020-01-01 NOTE — Progress Notes (Signed)
PROGRESS NOTE    Shawn [name]  ATF:[id number] DOB: August 09, 1985 DOA: 12/29/2019 PCP: Valera Castle, MD    Assessment & Plan:   Active Problems:   MDD (major depressive disorder)   Diarrhea   Chronic foot pain (Primary Area of Pain) (Bilateral) (L>R)   Chronic pain syndrome   Long term current use of opiate analgesic   Acute hypoxemic respiratory failure (HCC)   Decubitus ulcer of coccyx, unspecified pressure ulcer stage   ESRD (end stage renal disease) ([place])   Cavitary lesion of lung   Necrotizing pneumonia ([place])    Shawn [name] is a 34 y.o. male with PMH of ESRD on HD, insulin-dependent DM, admitted 12/29/19 due to Acute Hypoxic Respiratory Failure in the setting of Pulmonary edema/Volume Overload and questionable Necrotizing Pneumonia requiring BiPAP.  Transferred to floor on 6/28.   Acute Hypoxic Respiratory Failure in the setting of Pulmonary Edema/Volume Overload and ? Necrotizing Pneumonia -Supplemental O2 as needed to maintain O2 saturations greater than 88% -Hemodialysis for volume removal -continue zosyn (IV vanc d/c'ed per pharm due to neg MRSA screen) -As needed bronchodilators -Prn oral Morphine  ESRD on HD TTS Hyperkalemia 2/2 ESRD -iHD per Nephrology   Altered Mental Status>>resolved Hx: Seizures  Diabetes Mellitus -CBG's -SSI  Diarrhea, POA --C diff antigen pos, but toxin neg --No need to treat  --Imodium PRN --continue enteric precaution, per Infection Prevention  Unstageable coccyx wound  Present on admission --wound care consult   DVT prophylaxis: SCD/Compression stockings Code Status: Full code  Family Communication:  Status is: inpatient Dispo:   The patient is from: home Anticipated d/c is to: home Anticipated d/c date is: 2-3 days Patient currently is not medically stable to d/c due to: still on 3L O2, on IV abx for PNA.  Need dialysis for fluid overload.   Subjective and Interval History:  Pt reported dyspnea  improved.  Diarrhea improved with Imodium.  No fever, chest pain, abdominal pain, N/V.    Objective: Vitals:   01/01/20 2030 01/01/20 2045 01/01/20 2100 01/01/20 2115  BP: (!) 176/104 (!) 188/101 (!) 174/103 (!) 158/103  Pulse: 84 83 82 82  Resp: 19 15 13  (!) 6  Temp:      TempSrc:      SpO2: 92% 95% 93% 93%  Weight:      Height:        Intake/Output Summary (Last 24 hours) at 01/01/2020 2122 Last data filed at 01/01/2020 1853 Gross per 24 hour  Intake 158.01 ml  Output --  Net 158.01 ml   Filed Weights   12/30/19 0443 12/31/19 0500 01/01/20 0500  Weight: 53 kg 55.7 kg 55.8 kg    Examination:   Constitutional: NAD, AAOx3, ill-appearing HEENT: conjunctivae and lids normal, EOMI CV: RRR no M,R,G. Distal pulses +2.  No cyanosis.   RESP: diffuse crackles, normal respiratory effort, on 3L GI: +BS, NTND Extremities: No effusions, edema, or tenderness LLE.  Right BKA. SKIN: warm, dry and intact Neuro: II - XII grossly intact.  Sensation intact Psych: Depressed mood and affect.     Data Reviewed: I have personally reviewed following labs and imaging studies  CBC: Recent Labs  Lab 12/29/19 2121 12/30/19 0500 12/31/19 0605 01/01/20 0507 01/01/20 0950  WBC 5.5 14.0* 12.8* 10.4 10.8*  NEUTROABS 3.9 12.0* 8.6*  --   --   HGB 10.8* 10.0* 10.4* 10.0* 10.0*  HCT 36.3* 32.4* 33.3* 33.2* 32.9*  MCV 89.9 88.3 86.0 87.6 88.2  PLT 210 211  207 182 629   Basic Metabolic Panel: Recent Labs  Lab 12/29/19 2121 12/30/19 0500 12/31/19 0605 01/01/20 0507  NA 141 141 131* 132*  K 4.7 5.2* 5.4* 5.8*  CL 103 102 96* 98  CO2 22 24 18* 17*  GLUCOSE 126* 66* 154* 124*  BUN 66* 67* 80* 87*  CREATININE 6.07* 6.71* 7.19* 8.23*  CALCIUM 8.6* 8.4* 8.6* 8.2*  MG  --   --   --  1.9  PHOS  --   --   --  8.3*   GFR: Estimated Creatinine Clearance: 10.2 mL/min (A) (by C-G formula based on SCr of 8.23 mg/dL (H)). Liver Function Tests: Recent Labs  Lab 12/30/19 0500 12/31/19 0605   AST 12* 10*  ALT 18 13  ALKPHOS 98 85  BILITOT 0.8 0.6  PROT 6.4* 6.6  ALBUMIN 2.9* 3.0*   No results for input(s): LIPASE, AMYLASE in the last 168 hours. No results for input(s): AMMONIA in the last 168 hours. Coagulation Profile: No results for input(s): INR, PROTIME in the last 168 hours. Cardiac Enzymes: No results for input(s): CKTOTAL, CKMB, CKMBINDEX, TROPONINI in the last 168 hours. BNP (last 3 results) No results for input(s): PROBNP in the last 8760 hours. HbA1C: Recent Labs    12/30/19 0041  HGBA1C 7.4*   CBG: Recent Labs  Lab 01/01/20 0804 01/01/20 1134 01/01/20 1535 01/01/20 1630 01/01/20 2037  GLUCAP 121* 150* 186* 187* 116*   Lipid Profile: No results for input(s): CHOL, HDL, LDLCALC, TRIG, CHOLHDL, LDLDIRECT in the last 72 hours. Thyroid Function Tests: No results for input(s): TSH, T4TOTAL, FREET4, T3FREE, THYROIDAB in the last 72 hours. Anemia Panel: No results for input(s): VITAMINB12, FOLATE, FERRITIN, TIBC, IRON, RETICCTPCT in the last 72 hours. Sepsis Labs: Recent Labs  Lab 12/29/19 2259 12/29/19 2300 12/30/19 0041 12/30/19 0500 12/31/19 0605 01/01/20 0950  PROCALCITON  --  0.35  --  2.29 12.53 12.89  LATICACIDVEN 1.0  --  0.9  --   --   --     Recent Results (from the past 240 hour(s))  SARS Coronavirus 2 by RT PCR (hospital order, performed in [place] hospital lab) Nasopharyngeal Nasopharyngeal Swab     Status: None   Collection Time: 12/29/19 10:59 PM   Specimen: Nasopharyngeal Swab  Result Value Ref Range Status   SARS Coronavirus 2 NEGATIVE NEGATIVE Final    Comment: (NOTE) SARS-CoV-2 target nucleic acids are NOT DETECTED.  The SARS-CoV-2 RNA is generally detectable in upper and lower respiratory specimens during the acute phase of infection. The lowest concentration of SARS-CoV-2 viral copies this assay can detect is 250 copies / mL. A negative result does not preclude SARS-CoV-2 infection and should not be used as the  sole basis for treatment or other patient management decisions.  A negative result may occur with improper specimen collection / handling, submission of specimen other than nasopharyngeal swab, presence of viral mutation(s) within the areas targeted by this assay, and inadequate number of viral copies (<250 copies / mL). A negative result must be combined with clinical observations, patient history, and epidemiological information.  Fact Sheet for Patients:   StrictlyIdeas.no  Fact Sheet for Healthcare Providers: BankingDealers.co.za  This test is not yet approved or  cleared by the [place] FDA and has been authorized for detection and/or diagnosis of SARS-CoV-2 by FDA under an Emergency Use Authorization (EUA).  This EUA will remain in effect (meaning this test can be used) for the duration of the COVID-19 declaration  under Section 564(b)(1) of the Act, 21 U.S.C. section 360bbb-3(b)(1), unless the authorization is terminated or revoked sooner.  Performed at [facility], [address] [place], [place] [address]   Blood culture (routine x 2)     Status: None (Preliminary result)   Collection Time: 12/29/19 10:59 PM   Specimen: BLOOD  Result Value Ref Range Status   Specimen Description BLOOD RIGHT ANTECUBITAL  Final   Special Requests   Final    BOTTLES DRAWN AEROBIC AND ANAEROBIC Blood Culture adequate volume   Culture   Final    NO GROWTH 3 DAYS Performed at [facility], [address], [place], [place] [address]    Report Status PENDING  Incomplete  MRSA PCR Screening     Status: None   Collection Time: 12/30/19 12:25 AM   Specimen: Nasopharyngeal  Result Value Ref Range Status   MRSA by PCR NEGATIVE NEGATIVE Final    Comment:        The GeneXpert MRSA Assay (FDA approved for NASAL specimens only), is one component of a comprehensive MRSA colonization surveillance program. It is not intended  to diagnose MRSA infection nor to guide or monitor treatment for MRSA infections. Performed at [facility], [place], [place] [address]   C Difficile Quick Screen w PCR reflex     Status: Abnormal   Collection Time: 12/30/19  1:50 PM   Specimen: STOOL  Result Value Ref Range Status   C Diff antigen POSITIVE (A) NEGATIVE Final   C Diff toxin NEGATIVE NEGATIVE Final   C Diff interpretation Results are indeterminate. See PCR results.  Final    Comment: Performed at [facility], [address] [place], [place] [address]  C. Diff by PCR, Reflexed     Status: Abnormal   Collection Time: 12/30/19  1:50 PM  Result Value Ref Range Status   Toxigenic C. Difficile by PCR POSITIVE (A) NEGATIVE Final    Comment: Positive for toxigenic C. difficile with little to no toxin production. Only treat if clinical presentation suggests symptomatic illness. Performed at [facility], [address] [place], [place] [address]       Radiology Studies: CT HEAD WO CONTRAST  Result Date: 12/30/2019 CLINICAL DATA:  Altered mental status EXAM: CT HEAD WITHOUT CONTRAST TECHNIQUE: Contiguous axial images were obtained from the base of the skull through the vertex without intravenous contrast. COMPARISON:  None. FINDINGS: Brain: No acute intracranial abnormality. Specifically, no hemorrhage, hydrocephalus, mass lesion, acute infarction, or significant intracranial injury. Vascular: No hyperdense vessel or unexpected calcification. Skull: No acute calvarial abnormality. Sinuses/Orbits: Visualized paranasal sinuses and mastoids clear. Orbital soft tissues unremarkable. Other: None IMPRESSION: No acute intracranial abnormality. Electronically Signed   By: Rolm Baptise M.D.   On: 12/30/2019 23:29     Scheduled Meds: . Chlorhexidine Gluconate Cloth  6 each Topical Daily  . collagenase   Topical Daily  . epoetin (EPOGEN/PROCRIT) injection  4,000 Units Intravenous Q  T,Th,Sa-HD  . insulin aspart  0-6 Units Subcutaneous Q4H   Continuous Infusions: . sodium chloride    . sodium chloride    . piperacillin-tazobactam (ZOSYN)  IV Stopped (01/01/20 1326)     LOS: 3 days     Enzo Bi, MD [facility] If 7PM-7AM, please contact night-coverage 01/01/2020, 9:22 PM

## 2020-01-01 NOTE — Progress Notes (Signed)
Post HD Assessment    01/01/20 2141  Neurological  Level of Consciousness Alert  Orientation Level Oriented X4  Respiratory  Respiratory Pattern Regular;Unlabored  Chest Assessment Chest expansion symmetrical  Bilateral Breath Sounds Clear  Cardiac  Pulse Regular  Heart Sounds S1, S2  Jugular Venous Distention (JVD) No  ECG Monitor Yes  Cardiac Rhythm NSR  Musculoskeletal  Musculoskeletal (WDL) X  Psychosocial  Psychosocial (WDL) WDL  Patient Behaviors Appropriate for situation

## 2020-01-01 NOTE — Progress Notes (Addendum)
Pre HD Discrepancy in current weight and previous weight taken 01/01/20 in the morning. Current result 36.9 kg. Will notify primary RN of need to zero bed.     01/01/20 1837  Vital Signs  Resp 17  Time-Out for Hemodialysis  What Procedure? HD   Pt Identifiers(min of two) First/Last Name;MRN/Account#;Pt's DOB(use if MRN/Acct# not available  Correct Site? Yes  Correct Side? Yes  Correct Procedure? Yes  Consents Verified? Yes  Rad Studies Available? N/A  Safety Precautions Reviewed? Yes  Engineer, civil (consulting) Number 6  Station Number [id number] [phone])  UF/Alarm Test Passed  Conductivity: Meter 14  Conductivity: Machine  13.8  pH 7.2  Reverse Osmosis 7  Normal Saline Lot Number [id number]  Dialyzer Lot Number [id number]  Disposable Set Lot Number [id number]  Machine Temperature 98.6 F (37 C)  Musician and Audible Yes  Blood Lines Intact and Secured Yes  Pre Treatment Patient Checks  Vascular access used during treatment Catheter  HD catheter dressing before treatment WDL  Patient is receiving dialysis in a chair  (no)  Isolation Initiated Yes  Hemodialysis Consent Verified Yes  Hemodialysis Standing Orders Initiated Yes  ECG (Telemetry) Monitor On Yes  Prime Ordered Normal Saline  Length of  DialysisTreatment -hour(s) 3 Hour(s)  Dialysis Treatment Comments  (Na 140)  Dialyzer Elisio 17H NR  Dialysate 2K;2.5 Ca  Dialysate Flow Ordered 600  Blood Flow Rate Ordered 400 mL/min  Ultrafiltration Goal 0.5 Liters  Dialysis Blood Pressure Support Ordered Normal Saline  Education / Care Plan  Dialysis Education Provided Yes  Documented Education in Care Plan Yes  Outpatient Plan of Care Reviewed and on Chart Yes  Hemodialysis Catheter Left Subclavian  Placement Date: 12/28/19   Placed prior to admission: Yes  Orientation: Left  Access Location: Subclavian  Site Condition No complications  Blue Lumen Status Capped (Central line)  Red Lumen Status Capped (Central line)  Purple  Lumen Status N/A  Dressing Type Occlusive  Dressing Status Clean;Dry;Intact  Drainage Description None

## 2020-01-02 DIAGNOSIS — J850 Gangrene and necrosis of lung: Secondary | ICD-10-CM

## 2020-01-02 DIAGNOSIS — Z992 Dependence on renal dialysis: Secondary | ICD-10-CM

## 2020-01-02 DIAGNOSIS — L89159 Pressure ulcer of sacral region, unspecified stage: Secondary | ICD-10-CM

## 2020-01-02 DIAGNOSIS — M79673 Pain in unspecified foot: Secondary | ICD-10-CM

## 2020-01-02 DIAGNOSIS — G8929 Other chronic pain: Secondary | ICD-10-CM

## 2020-01-02 DIAGNOSIS — N186 End stage renal disease: Secondary | ICD-10-CM

## 2020-01-02 LAB — BASIC METABOLIC PANEL
Anion gap: 13 (ref 5–15)
BUN: 42 mg/dL — ABNORMAL HIGH (ref 6–20)
CO2: 26 mmol/L (ref 22–32)
Calcium: 8.3 mg/dL — ABNORMAL LOW (ref 8.9–10.3)
Chloride: 94 mmol/L — ABNORMAL LOW (ref 98–111)
Creatinine, Ser: 4.75 mg/dL — ABNORMAL HIGH (ref 0.61–1.24)
GFR calc Af Amer: 17 mL/min — ABNORMAL LOW (ref >60)
GFR calc non Af Amer: 15 mL/min — ABNORMAL LOW (ref >60)
Glucose, Bld: 114 mg/dL — ABNORMAL HIGH (ref 70–99)
Potassium: 3.8 mmol/L (ref 3.5–5.1)
Sodium: 133 mmol/L — ABNORMAL LOW (ref 135–145)

## 2020-01-02 LAB — GLUCOSE, CAPILLARY
Glucose-Capillary: 105 mg/dL — ABNORMAL HIGH (ref 70–99)
Glucose-Capillary: 116 mg/dL — ABNORMAL HIGH (ref 70–99)
Glucose-Capillary: 139 mg/dL — ABNORMAL HIGH (ref 70–99)
Glucose-Capillary: 71 mg/dL (ref 70–99)
Glucose-Capillary: 90 mg/dL (ref 70–99)
Glucose-Capillary: 90 mg/dL (ref 70–99)

## 2020-01-02 LAB — CBC
HCT: 32.5 % — ABNORMAL LOW (ref 39.0–52.0)
Hemoglobin: 10.3 g/dL — ABNORMAL LOW (ref 13.0–17.0)
MCH: 27 pg (ref 26.0–34.0)
MCHC: 31.7 g/dL (ref 30.0–36.0)
MCV: 85.3 fL (ref 80.0–100.0)
Platelets: 153 10*3/uL (ref 150–400)
RBC: 3.81 MIL/uL — ABNORMAL LOW (ref 4.22–5.81)
RDW: 17.2 % — ABNORMAL HIGH (ref 11.5–15.5)
WBC: 8.8 10*3/uL (ref 4.0–10.5)
nRBC: 0 % (ref 0.0–0.2)

## 2020-01-02 LAB — PHOSPHORUS: Phosphorus: 5.4 mg/dL — ABNORMAL HIGH (ref 2.5–4.6)

## 2020-01-02 LAB — FUNGITELL, SERUM: Fungitell Result: 342 pg/mL — ABNORMAL HIGH (ref <80)

## 2020-01-02 LAB — MAGNESIUM: Magnesium: 1.6 mg/dL — ABNORMAL LOW (ref 1.7–2.4)

## 2020-01-02 MED ORDER — PROMETHAZINE HCL 25 MG PO TABS
25.0000 mg | ORAL_TABLET | Freq: Four times a day (QID) | ORAL | Status: DC | PRN
  Administered 2020-01-02 – 2020-01-03 (×3): 25 mg via ORAL
  Filled 2020-01-02 (×3): qty 1

## 2020-01-02 MED ORDER — FERROUS SULFATE 325 (65 FE) MG PO TABS
325.0000 mg | ORAL_TABLET | Freq: Two times a day (BID) | ORAL | Status: DC
  Administered 2020-01-03 – 2020-01-12 (×16): 325 mg via ORAL
  Filled 2020-01-02 (×16): qty 1

## 2020-01-02 MED ORDER — ALPRAZOLAM 0.25 MG PO TABS
0.2500 mg | ORAL_TABLET | Freq: Three times a day (TID) | ORAL | Status: DC | PRN
  Administered 2020-01-04: 0.25 mg via ORAL
  Filled 2020-01-02: qty 1

## 2020-01-02 MED ORDER — CALCIUM CARBONATE-VITAMIN D 500-200 MG-UNIT PO TABS
1.0000 | ORAL_TABLET | Freq: Two times a day (BID) | ORAL | Status: DC
  Administered 2020-01-03 – 2020-01-12 (×17): 1 via ORAL
  Filled 2020-01-02 (×16): qty 1

## 2020-01-02 MED ORDER — ASCORBIC ACID 500 MG PO TABS
250.0000 mg | ORAL_TABLET | Freq: Two times a day (BID) | ORAL | Status: DC
  Administered 2020-01-02 – 2020-01-12 (×19): 250 mg via ORAL
  Filled 2020-01-02 (×18): qty 1

## 2020-01-02 MED ORDER — INSULIN GLARGINE 100 UNIT/ML ~~LOC~~ SOLN
10.0000 [IU] | Freq: Every day | SUBCUTANEOUS | Status: DC
  Administered 2020-01-02 – 2020-01-03 (×2): 10 [IU] via SUBCUTANEOUS
  Filled 2020-01-02 (×3): qty 0.1

## 2020-01-02 MED ORDER — GABAPENTIN 300 MG PO CAPS
300.0000 mg | ORAL_CAPSULE | Freq: Two times a day (BID) | ORAL | Status: DC
  Administered 2020-01-02 – 2020-01-12 (×18): 300 mg via ORAL
  Filled 2020-01-02 (×19): qty 1

## 2020-01-02 NOTE — Progress Notes (Signed)
Hemodialysis known at [place] ([address] TTS 10:30, patient stated that he rides with [facility]. Please contact me with any dialysis concerns.  Elvera Bicker Dialysis Coordinator [phone]

## 2020-01-02 NOTE — Progress Notes (Signed)
[facility]  ROUNDING NOTE   Subjective:   Hemodialysis treatment yesterday. Tolerated treatment well. UF of 41mL.   Objective:  Vital signs in last 24 hours:  Temp:  [97.8 F (36.6 C)-98.5 F (36.9 C)] 97.8 F (36.6 C) (06/30 1216) Pulse Rate:  [73-86] 86 (06/30 1216) Resp:  [6-28] 15 (06/30 1216) BP: (133-188)/(85-106) 142/89 (06/30 1216) SpO2:  [91 %-98 %] 91 % (06/30 1216) Weight:  [56.5 kg] 56.5 kg (06/30 0500)  Weight change: 0.718 kg Filed Weights   12/31/19 0500 01/01/20 0500 01/02/20 0500  Weight: 55.7 kg 55.8 kg 56.5 kg    Intake/Output: I/O last 3 completed shifts: In: 548 [P.O.:290; IV Piggyback:258] Out: 431 [Other:431]   Intake/Output this shift:  No intake/output data recorded.  Physical Exam: General: NAD,   Head: Normocephalic, atraumatic. Moist oral mucosal membranes  Eyes: Anicteric, PERRL  Neck: Supple, trachea midline  Lungs:  Clear to auscultation  Heart: Regular rate and rhythm  Abdomen:  Soft, nontender,   Extremities:  no peripheral edema. Right BKA  Neurologic: Nonfocal, moving all four extremities  Skin: No lesions  Access: RIJ permcath    Basic Metabolic Panel: Recent Labs  Lab 12/29/19 2121 12/29/19 2121 12/30/19 0500 12/30/19 0500 12/31/19 0605 01/01/20 0507 01/02/20 0536  NA 141  --  141  --  131* 132* 133*  K 4.7  --  5.2*  --  5.4* 5.8* 3.8  CL 103  --  102  --  96* 98 94*  CO2 22  --  24  --  18* 17* 26  GLUCOSE 126*  --  66*  --  154* 124* 114*  BUN 66*  --  67*  --  80* 87* 42*  CREATININE 6.07*  --  6.71*  --  7.19* 8.23* 4.75*  CALCIUM 8.6*   < > 8.4*   < > 8.6* 8.2* 8.3*  MG  --   --   --   --   --  1.9 1.6*  PHOS  --   --   --   --   --  8.3* 5.4*   < > = values in this interval not displayed.    Liver Function Tests: Recent Labs  Lab 12/30/19 0500 12/31/19 0605  AST 12* 10*  ALT 18 13  ALKPHOS 98 85  BILITOT 0.8 0.6  PROT 6.4* 6.6  ALBUMIN 2.9* 3.0*   No results for input(s):  LIPASE, AMYLASE in the last 168 hours. No results for input(s): AMMONIA in the last 168 hours.  CBC: Recent Labs  Lab 12/29/19 2121 12/29/19 2121 12/30/19 0500 12/31/19 0605 01/01/20 0507 01/01/20 0950 01/02/20 0536  WBC 5.5   < > 14.0* 12.8* 10.4 10.8* 8.8  NEUTROABS 3.9  --  12.0* 8.6*  --   --   --   HGB 10.8*   < > 10.0* 10.4* 10.0* 10.0* 10.3*  HCT 36.3*   < > 32.4* 33.3* 33.2* 32.9* 32.5*  MCV 89.9   < > 88.3 86.0 87.6 88.2 85.3  PLT 210   < > 211 207 182 192 153   < > = values in this interval not displayed.    Cardiac Enzymes: No results for input(s): CKTOTAL, CKMB, CKMBINDEX, TROPONINI in the last 168 hours.  BNP: Invalid input(s): POCBNP  CBG: Recent Labs  Lab 01/01/20 2037 01/01/20 2347 01/02/20 0449 01/02/20 0812 01/02/20 1212  GLUCAP 116* 116* 90 90 139*    Microbiology: Results for orders placed or performed  during the hospital encounter of 12/29/19  SARS Coronavirus 2 by RT PCR (hospital order, performed in [facility] hospital lab) Nasopharyngeal Nasopharyngeal Swab     Status: None   Collection Time: 12/29/19 10:59 PM   Specimen: Nasopharyngeal Swab  Result Value Ref Range Status   SARS Coronavirus 2 NEGATIVE NEGATIVE Final    Comment: (NOTE) SARS-CoV-2 target nucleic acids are NOT DETECTED.  The SARS-CoV-2 RNA is generally detectable in upper and lower respiratory specimens during the acute phase of infection. The lowest concentration of SARS-CoV-2 viral copies this assay can detect is 250 copies / mL. A negative result does not preclude SARS-CoV-2 infection and should not be used as the sole basis for treatment or other patient management decisions.  A negative result may occur with improper specimen collection / handling, submission of specimen other than nasopharyngeal swab, presence of viral mutation(s) within the areas targeted by this assay, and inadequate number of viral copies (<250 copies / mL). A negative result must be combined  with clinical observations, patient history, and epidemiological information.  Fact Sheet for Patients:   StrictlyIdeas.no  Fact Sheet for Healthcare Providers: BankingDealers.co.za  This test is not yet approved or  cleared by the [place] FDA and has been authorized for detection and/or diagnosis of SARS-CoV-2 by FDA under an Emergency Use Authorization (EUA).  This EUA will remain in effect (meaning this test can be used) for the duration of the COVID-19 declaration under Section 564(b)(1) of the Act, 21 U.S.C. section 360bbb-3(b)(1), unless the authorization is terminated or revoked sooner.  Performed at [facility], [address] [place], [place] [address]   Blood culture (routine x 2)     Status: None (Preliminary result)   Collection Time: 12/29/19 10:59 PM   Specimen: BLOOD  Result Value Ref Range Status   Specimen Description BLOOD RIGHT ANTECUBITAL  Final   Special Requests   Final    BOTTLES DRAWN AEROBIC AND ANAEROBIC Blood Culture adequate volume   Culture   Final    NO GROWTH 4 DAYS Performed at [facility], [address], [place], [place] [address]    Report Status PENDING  Incomplete  MRSA PCR Screening     Status: None   Collection Time: 12/30/19 12:25 AM   Specimen: Nasopharyngeal  Result Value Ref Range Status   MRSA by PCR NEGATIVE NEGATIVE Final    Comment:        The GeneXpert MRSA Assay (FDA approved for NASAL specimens only), is one component of a comprehensive MRSA colonization surveillance program. It is not intended to diagnose MRSA infection nor to guide or monitor treatment for MRSA infections. Performed at [facility], [place], [place] [address]   C Difficile Quick Screen w PCR reflex     Status: Abnormal   Collection Time: 12/30/19  1:50 PM   Specimen: STOOL  Result Value Ref Range Status   C Diff antigen POSITIVE (A) NEGATIVE Final    C Diff toxin NEGATIVE NEGATIVE Final   C Diff interpretation Results are indeterminate. See PCR results.  Final    Comment: Performed at [facility], [address] [place], [place] [address]  C. Diff by PCR, Reflexed     Status: Abnormal   Collection Time: 12/30/19  1:50 PM  Result Value Ref Range Status   Toxigenic C. Difficile by PCR POSITIVE (A) NEGATIVE Final    Comment: Positive for toxigenic C. difficile with little to no toxin  production. Only treat if clinical presentation suggests symptomatic illness. Performed at [facility], [address] [place], [place] [address]     Coagulation Studies: No results for input(s): LABPROT, INR in the last 72 hours.  Urinalysis: No results for input(s): COLORURINE, LABSPEC, PHURINE, GLUCOSEU, HGBUR, BILIRUBINUR, KETONESUR, PROTEINUR, UROBILINOGEN, NITRITE, LEUKOCYTESUR in the last 72 hours.  Invalid input(s): APPERANCEUR    Imaging: No results found.   Medications:   . sodium chloride    . sodium chloride    . piperacillin-tazobactam (ZOSYN)  IV 2.25 g (01/02/20 1333)   . ascorbic acid  250 mg Oral BID  . calcium-vitamin D  1 tablet Oral BID WC  . Chlorhexidine Gluconate Cloth  6 each Topical Daily  . collagenase   Topical Daily  . epoetin (EPOGEN/PROCRIT) injection  4,000 Units Intravenous Q T,Th,Sa-HD  . ferrous sulfate  325 mg Oral BID WC  . gabapentin  300 mg Oral BID  . insulin aspart  0-6 Units Subcutaneous Q4H  . insulin glargine  10 Units Subcutaneous Daily   sodium chloride, sodium chloride, ALPRAZolam, alteplase, docusate sodium, heparin, ipratropium-albuterol, lidocaine (PF), lidocaine-prilocaine, loperamide, morphine, ondansetron (ZOFRAN) IV, pentafluoroprop-tetrafluoroeth, polyethylene glycol, promethazine  Assessment/ Plan:  [name] is a 34 y.o. white male with end stage renal disease on hemodialysis, hypertension, diabetes mellitus, GERD, IBS, osteomyelitis who was admitted  to [facility] on 12/29/2019 for End stage renal disease on dialysis ([place]) [N18.6, Z99.2] Acute respiratory failure with hypoxia ([place]) [J96.01]   1. End Stage renal disease:  Dialysis for tomorrow.   2. Hypertension: not currently on any antihypertensive agents.   3. Anemia of chronic kidney disease:   - EPO with HD treatment.   4. Secondary Hyperparathyroidism:  - Calcium carbonate with meals.    LOS: 4 Anaelle Dunton 6/30/20212:55 PM

## 2020-01-02 NOTE — Progress Notes (Signed)
Notified by Telemetry of patient oxygenation saturation in 60s. Patient found with nasal cannula off.  Reapplied nasal cannula and patient oxygenation recovered in couple minutes to  98% on 3 L via [place]. Patient with blank stare for approximately 15 sec before answering then was able to answer questions and respond appropriately.  Reeducation given to leave oxygen in nose. Patient verbalized understanding. MD notified and will continue to monitor.

## 2020-01-02 NOTE — Progress Notes (Signed)
[facility] [place] NAME: Yitzchak Kothari    MR#:  [id number]  DATE OF BIRTH:  May 17, 1986  SUBJECTIVE:   Having mild emesis with nausea Pt says it happens when his IBS gets activated REVIEW OF SYSTEMS:   Review of Systems  Constitutional: Negative for chills, fever and weight loss.  HENT: Negative for ear discharge, ear pain and nosebleeds.   Eyes: Negative for blurred vision, pain and discharge.  Respiratory: Negative for sputum production, shortness of breath, wheezing and stridor.   Cardiovascular: Negative for chest pain, palpitations, orthopnea and PND.  Gastrointestinal: Positive for nausea and vomiting. Negative for abdominal pain and diarrhea.  Genitourinary: Negative for frequency and urgency.  Musculoskeletal: Negative for back pain and joint pain.  Neurological: Positive for weakness. Negative for sensory change, speech change and focal weakness.  Psychiatric/Behavioral: Negative for depression and hallucinations. The patient is not nervous/anxious.    Tolerating Diet:yes Tolerating PT: bed bound/WC  DRUG ALLERGIES:   Allergies  Allergen Reactions  . Banana Hives, Nausea And Vomiting and Rash  . Keflex [Cephalexin] Rash    No swelling- also taken penicillin without any issue.  . Sulfa Antibiotics Anaphylaxis  . Grapeseed Extract [Nutritional Supplements] Itching  . Shellfish Allergy Hives    "ALL SEAFOOD"  . Grape Seed Rash    VITALS:  Blood pressure (!) 142/89, pulse 86, temperature 97.8 F (36.6 C), resp. rate 15, height 5\' 6"  (1.676 m), weight 56.5 kg, SpO2 91 %.  PHYSICAL EXAMINATION:   Physical Exam  GENERAL:  34 y.o.-year-old patient lying in the bed with no acute distress. thin EYES: Pupils equal, round, reactive to light and accommodation. No scleral icterus.   HEENT: Head atraumatic, normocephalic. Oropharynx and nasopharynx clear.  NECK:  Supple, no jugular venous distention. No thyroid enlargement, no  tenderness.  LUNGS: Normal breath sounds bilaterally, no wheezing, rales, rhonchi. No use of accessory muscles of respiration.  CARDIOVASCULAR: S1, S2 normal. No murmurs, rubs, or gallops.  ABDOMEN: Soft, nontender, nondistended. Bowel sounds present. No organomegaly or mass.  EXTREMITIES: right BKA Left foot drop/atrophy LE NEUROLOGIC: Cranial nerves II through XII are intact. Grossly nonfocalPSYCHIATRIC:  patient is alert and oriented x 3.  SKIN: No obvious rash, lesion, or ulcer.   LABORATORY PANEL:  CBC Recent Labs  Lab 01/02/20 0536  WBC 8.8  HGB 10.3*  HCT 32.5*  PLT 153    Chemistries  Recent Labs  Lab 12/31/19 0605 01/01/20 0507 01/02/20 0536  NA 131*   < > 133*  K 5.4*   < > 3.8  CL 96*   < > 94*  CO2 18*   < > 26  GLUCOSE 154*   < > 114*  BUN 80*   < > 42*  CREATININE 7.19*   < > 4.75*  CALCIUM 8.6*   < > 8.3*  MG  --    < > 1.6*  AST 10*  --   --   ALT 13  --   --   ALKPHOS 85  --   --   BILITOT 0.6  --   --    < > = values in this interval not displayed.   Cardiac Enzymes No results for input(s): TROPONINI in the last 168 hours. RADIOLOGY:  No results found. ASSESSMENT AND PLAN:  DIONEL ARCHEY is a 34 y.o. malewith PMH of ESRD on HD, insulin-dependent DM, admitted 6/26/21due toAcute Hypoxic Respiratory Failure in the setting of Pulmonary  edema/Volume Overload and questionable Necrotizing Pneumonia requiring BiPAP.  Transferred to floor on 6/28.  Acute Hypoxic Respiratory Failure in the setting of Pulmonary Edema/Volume Overload and  Necrotizing Pneumonia -Supplemental O2 as needed to maintain O2 saturations greater than 88% -Hemodialysis for volume removal--unable to do yday due to low BP -continue zosyn (IV vanc d/c'ed per pharm due to neg MRSA screen) -As needed bronchodilators -Prn oral Morphine -wean oxygen down as tolerated  ESRD on HD TTS Hyperkalemia 2/2 ESRD--and drinking OJ. Pt refuses renal diet -HD per Nephrology   Altered  Mental Status>>resolved Hx: Seizures  Diabetes Mellitus -CBG's -SSI -started lantus 10 units today  Diarrhea, POA --C diff antigen pos, but toxin neg --No need to treat  --Imodium PRN --continue enteric precaution, per Infection Prevention -has h/o IBS  Unstageable coccyx wound Present on admission --wound care consult appreicated--Dressing procedure/placement/frequency:Cleanse sacrum with NS and pat dry. Apply Santyl to open wound.  Cover with NS moist 2x2 and secure with silicone foam.  Change daily.  Turn and reposition every two hours.    DVT prophylaxis: SCD/Compression stockings Code Status: Full code  Family Communication:  Status is: inpatient Dispo:   The patient is from: home Anticipated d/c is to: home Anticipated d/c date is: 2-3 days Patient currently is not medically stable to d/c due to: still on 3L O2, on IV abx for PNA.  Need dialysis for fluid overload.  TOTAL TIME TAKING CARE OF THIS PATIENT: *30* minutes.  >50% time spent on counselling and coordination of care  Note: This dictation was prepared with Dragon dictation along with smaller phrase technology. Any transcriptional errors that result from this process are unintentional.  Fritzi Mandes M.D    [facility]   CC: Primary care physician; Valera Castle, MDPatient ID: Orest Dikes, male   DOB: 1986-02-15, 34 y.o.   MRN: [id number]

## 2020-01-02 NOTE — TOC Initial Note (Signed)
Transition of Care [facility]) - Initial/Assessment Note    Patient Details  Name: Shawn [name] MRN: [id number] Date of Birth: 04/20/1986  Transition of Care [facility] [address] CM/SW Contact:    Beverly Sessions, RN Phone Number: 01/02/2020, 2:30 PM  Clinical Narrative:                  Patient admitted from home with respiratory failure.   Patient states that he lives at home with his dad.  Dad has been doing dry dressing changes to the sacral area.   Patient goes to HD TTS. PCP [facility].  Patient states that he uses [facility] for transportation, and denies any issues.   Ph[facility]acy [facility] - denies issues obtaining medications  Patient states that he has a WC, BSC,  RW, lift, slide board, and hospital bed in the home  Patient has PCS services 3 days a week for an hour a day.   Patient currently on acute O2, and may require at discharge.  Heads up referral made to [facility] with [place].   If patient requires home health services at discharge it will likely be difficult to get and agency to accept the patient due to his payor.  He is aware of this.   RNCM discussed the option of long term care placement as an option for if/when he is ready.  He states this is not something he would be interested in at this time and wants to return home.    Expected Discharge Plan: Home/Self Care Barriers to Discharge: Continued Medical Work up   Patient Goals and CMS Choice        Expected Discharge Plan and Services Expected Discharge Plan: Home/Self Care   Discharge Planning Services: CM Consult   Living arrangements for the past 2 months: Single Family Home                                      Prior Living Arrangements/Services Living arrangements for the past 2 months: Single Family Home Lives with:: Self Patient language and need for interpreter reviewed:: Yes Do you feel safe going back to the place where you live?: Yes      Need for Family Participation in Patient Care: Yes  (Comment) Care giver support system in place?: Yes (comment) Current home services: DME, Other (comment) (PCS) Criminal Activity/Legal Involvement Pertinent to Current Situation/Hospitalization: No - Comment as needed  Activities of Daily Living Home Assistive Devices/Equipment: Wheelchair, CBG Meter, Hospital bed, Transfer board ADL Screening (condition at time of admission) Patient's cognitive ability adequate to safely complete daily activities?: Yes Is the patient deaf or have difficulty hearing?: No Does the patient have difficulty seeing, even when wearing glasses/contacts?: Yes Does the patient have difficulty concentrating, remembering, or making decisions?: No Patient able to express need for assistance with ADLs?: Yes Does the patient have difficulty dressing or bathing?: Yes Independently performs ADLs?: No Communication: Independent Dressing (OT): Needs assistance Is this a change from baseline?: Pre-admission baseline Grooming: Needs assistance Is this a change from baseline?: Pre-admission baseline Feeding: Independent Bathing: Needs assistance Is this a change from baseline?: Pre-admission baseline Toileting: Needs assistance Is this a change from baseline?: Pre-admission baseline In/Out Bed: Needs assistance Is this a change from baseline?: Pre-admission baseline Walks in Home: Needs assistance Is this a change from baseline?: Pre-admission baseline Does the patient have difficulty walking or climbing stairs?: Yes Weakness of  Legs: Both Weakness of Arms/Hands: Both  Permission Sought/Granted                  Emotional Assessment       Orientation: : Oriented to Self, Oriented to Place, Oriented to  Time, Oriented to Situation      Admission diagnosis:  End stage renal disease on dialysis ([place]) [N18.6, Z99.2] Acute respiratory failure with hypoxia ([place]) [J96.01] Patient Active Problem List   Diagnosis Date Noted  . Decubitus ulcer of coccyx,  unspecified pressure ulcer stage 01/01/2020  . ESRD (end stage renal disease) ([place]) 01/01/2020  . Cavitary lesion of lung 01/01/2020  . Necrotizing pneumonia ([place]) 01/01/2020  . Acute hypoxemic respiratory failure ([place])   . 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 Hyperglycemia 10/11/2018  . Pressure injury of skin 09/20/2018  . Uncontrolled diabetes mellitus ([place]) 09/19/2018  . Neurogenic pain 09/13/2018  . Marijuana use 09/13/2018  . Long term prescription benzodiazepine use 09/13/2018  . Insulin-dependent diabetes mellitus with renal complications 93/81/0175  . Vitamin D deficiency 08/21/2018  . Hypocalcemia 08/21/2018  . CKD stage G3a/A1, GFR 45-59 and albumin creatinine ratio <30 mg/g 08/21/2018  . History of acute renal failure 08/21/2018  . Aldine (hyperglycemic hyperosmolar nonketotic coma) ([place]) 08/16/2018  . Chronic foot pain (Primary Area of Pain) (Bilateral) (L>R) 08/14/2018  . Chronic lower extremity pain (Secondary Area of Pain) (Bilateral) (L>R) 08/14/2018  . Chronic generalized abdominal pain [facility] Area of Pain) 08/14/2018  . Chronic pain syndrome 08/14/2018  . Long term current use of opiate analgesic 08/14/2018  . Pharmacologic therapy 08/14/2018  . Disorder of skeletal system 08/14/2018  . Problems influencing health status 08/14/2018  . Nausea & vomiting 07/16/2018  . IV infusion line dysfunction ([place]) 07/09/2018  . Hyperglycemic hyperosmolar nonketotic coma ([place]) 05/28/2018  . Acute renal failure (ARF) ([place]) 04/27/2018  . Left foot infection 04/05/2018  . Foot infection 02/06/2018  . Local infection of the skin and subcutaneous tissue, unspecified 02/06/2018  . Hypertension associated with diabetes ([place]) 01/16/2018  . Osteomyelitis ([place]) 01/10/2018  . Cellulitis 12/29/2017  . Sepsis ([place]) 12/18/2017  . Moderate recurrent major depression ([place]) 11/10/2017  . Type 2 diabetes mellitus with hyperosmolar nonketotic hyperglycemia  ([place]) 11/09/2017  . History of migraine 07/15/2017  . IBS (irritable bowel syndrome) 07/15/2017  . Protein-calorie malnutrition, severe 07/14/2017  . Abdominal pain 07/13/2017  . Diarrhea 06/01/2013  . 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 GERD (gastroesophageal reflux disease) 12/30/2009  . Type 1 diabetes mellitus with diabetic polyneuropathy ([place]) 11/25/2009  . MDD (major depressive disorder) 11/25/2009  . Hypercholesterolemia 03/07/2008   PCP:  Valera Castle, MD Pharmacy:   [facility] [place] [address] [place] [address] Phone: [phone] Fax: [phone]     Social Determinants of Health (SDOH) Interventions    Readmission Risk Interventions Readmission Risk Prevention Plan 01/02/2020 10/13/2018 09/21/2018  Transportation Screening Complete Complete Complete  Medication Review Press photographer) Complete Complete Complete  PCP or Specialist appointment within 3-5 days of discharge - Complete Complete  HRI or [place] - Complete Complete  SW Recovery Care/Counseling Consult - (No Data) Complete  Palliative Care Screening - Not Applicable Not Applicable  Skilled Nursing Facility - Not Applicable Not Applicable  Some recent data might be hidden

## 2020-01-03 LAB — BASIC METABOLIC PANEL
Anion gap: 14 (ref 5–15)
BUN: 53 mg/dL — ABNORMAL HIGH (ref 6–20)
CO2: 26 mmol/L (ref 22–32)
Calcium: 8.5 mg/dL — ABNORMAL LOW (ref 8.9–10.3)
Chloride: 93 mmol/L — ABNORMAL LOW (ref 98–111)
Creatinine, Ser: 5.66 mg/dL — ABNORMAL HIGH (ref 0.61–1.24)
GFR calc Af Amer: 14 mL/min — ABNORMAL LOW (ref >60)
GFR calc non Af Amer: 12 mL/min — ABNORMAL LOW (ref >60)
Glucose, Bld: 110 mg/dL — ABNORMAL HIGH (ref 70–99)
Potassium: 4.7 mmol/L (ref 3.5–5.1)
Sodium: 133 mmol/L — ABNORMAL LOW (ref 135–145)

## 2020-01-03 LAB — CBC
HCT: 35.6 % — ABNORMAL LOW (ref 39.0–52.0)
Hemoglobin: 11.1 g/dL — ABNORMAL LOW (ref 13.0–17.0)
MCH: 26.9 pg (ref 26.0–34.0)
MCHC: 31.2 g/dL (ref 30.0–36.0)
MCV: 86.2 fL (ref 80.0–100.0)
Platelets: 157 10*3/uL (ref 150–400)
RBC: 4.13 MIL/uL — ABNORMAL LOW (ref 4.22–5.81)
RDW: 17 % — ABNORMAL HIGH (ref 11.5–15.5)
WBC: 8.5 10*3/uL (ref 4.0–10.5)
nRBC: 0 % (ref 0.0–0.2)

## 2020-01-03 LAB — GLUCOSE, CAPILLARY
Glucose-Capillary: 102 mg/dL — ABNORMAL HIGH (ref 70–99)
Glucose-Capillary: 114 mg/dL — ABNORMAL HIGH (ref 70–99)
Glucose-Capillary: 161 mg/dL — ABNORMAL HIGH (ref 70–99)
Glucose-Capillary: 79 mg/dL (ref 70–99)
Glucose-Capillary: 80 mg/dL (ref 70–99)
Glucose-Capillary: 96 mg/dL (ref 70–99)

## 2020-01-03 LAB — MAGNESIUM: Magnesium: 1.8 mg/dL (ref 1.7–2.4)

## 2020-01-03 LAB — CULTURE, BLOOD (ROUTINE X 2)
Culture: NO GROWTH
Special Requests: ADEQUATE

## 2020-01-03 LAB — PHOSPHORUS: Phosphorus: 8.5 mg/dL — ABNORMAL HIGH (ref 2.5–4.6)

## 2020-01-03 MED ORDER — OXYCODONE HCL 5 MG PO TABS
5.0000 mg | ORAL_TABLET | ORAL | Status: DC | PRN
  Administered 2020-01-03: 5 mg via ORAL
  Filled 2020-01-03 (×2): qty 1

## 2020-01-03 MED ORDER — PROMETHAZINE HCL 25 MG PO TABS: 12.5000 mg | ORAL_TABLET | Freq: Three times a day (TID) | ORAL | Status: DC | PRN

## 2020-01-03 MED ORDER — PROMETHAZINE HCL 25 MG PO TABS: 12.5000 mg | ORAL_TABLET | Freq: Two times a day (BID) | ORAL | Status: DC | PRN

## 2020-01-03 MED ORDER — MORPHINE SULFATE 15 MG PO TABS: 15.0000 mg | ORAL_TABLET | Freq: Three times a day (TID) | ORAL | Status: DC | PRN

## 2020-01-03 MED ORDER — ACETAMINOPHEN 325 MG PO TABS
650.0000 mg | ORAL_TABLET | Freq: Four times a day (QID) | ORAL | Status: DC | PRN
  Administered 2020-01-03: 650 mg via ORAL
  Filled 2020-01-03: qty 2

## 2020-01-03 NOTE — Progress Notes (Signed)
Tele alerted RN, patient O2 Sats in 50's. Patient found in bed with [place] off. Patient lethargic, but responds when shaken. Patient looks out with a blank stare and then quickly falls back to sleep. Patient placed on NRB and back to [place] at 4L once O2 Sats have recovered. Patient O2 Sats 96%. Patient able to answer questions, but still falls quickly back to sleep. MD at bedside.   Fuller Mandril, RN

## 2020-01-03 NOTE — Plan of Care (Signed)
PMT note:  Consult noted. Patient currently receiving dialysis. He is groggy. Will reattempt visit tomorrow.

## 2020-01-03 NOTE — Progress Notes (Signed)
[facility] [place] NAME: Shawn [name]    MR#:  [id number]  DATE OF BIRTH:  [birth year]  SUBJECTIVE:  did well with breakfast. Now started having mild emesis with nausea quite sleepy. Received earlier morphine and Phenergan. Has mild upper body jerkiness although patient awake REVIEW OF SYSTEMS:   Review of Systems  Constitutional: Negative for chills, fever and weight loss.  HENT: Negative for ear discharge, ear pain and nosebleeds.   Eyes: Negative for blurred vision, pain and discharge.  Respiratory: Negative for sputum production, shortness of breath, wheezing and stridor.   Cardiovascular: Negative for chest pain, palpitations, orthopnea and PND.  Gastrointestinal: Positive for nausea and vomiting. Negative for abdominal pain and diarrhea.  Genitourinary: Negative for frequency and urgency.  Musculoskeletal: Negative for back pain and joint pain.  Neurological: Positive for weakness. Negative for sensory change, speech change and focal weakness.  Psychiatric/Behavioral: Negative for depression and hallucinations. The patient is not nervous/anxious.    Tolerating Diet:yes Tolerating PT: bed bound/WC  DRUG ALLERGIES:   Allergies  Allergen Reactions  . Banana Hives, Nausea And Vomiting and Rash  . Keflex [Cephalexin] Rash    No swelling- also taken penicillin without any issue.  . Sulfa Antibiotics Anaphylaxis  . Grapeseed Extract [Nutritional Supplements] Itching  . Shellfish Allergy Hives    "ALL SEAFOOD"  . Grape Seed Rash    VITALS:  Blood pressure (!) 144/82, pulse 86, temperature 98.2 F (36.8 C), temperature source Oral, resp. rate 12, height 5\' 6"  (1.676 m), weight 56.5 kg, SpO2 98 %.  PHYSICAL EXAMINATION:   Physical Exam  GENERAL:  34 y.o.-year-old patient lying in the bed with no acute distress. thin cachectic EYES: Pupils equal, round, reactive to light and accommodation. No scleral icterus.   HEENT: Head  atraumatic, normocephalic. Oropharynx and nasopharynx clear.  NECK:  Supple, no jugular venous distention. No thyroid enlargement, no tenderness.  LUNGS: Normal breath sounds bilaterally, no wheezing, rales, rhonchi. No use of accessory muscles of respiration.  CARDIOVASCULAR: S1, S2 normal. No murmurs, rubs, or gallops.  ABDOMEN: Soft, nontender, nondistended. Bowel sounds present. No organomegaly or mass.  EXTREMITIES: right BKA Left foot drop/atrophy LE NEUROLOGIC: Cranial nerves II through XII are intact. Grossly nonfocalPSYCHIATRIC:  patient is alert and oriented x 3.  SKIN: No obvious rash, lesion, or ulcer.   LABORATORY PANEL:  CBC Recent Labs  Lab 01/03/20 0442  WBC 8.5  HGB 11.1*  HCT 35.6*  PLT 157    Chemistries  Recent Labs  Lab 12/31/19 0605 01/01/20 0507 01/03/20 0442  NA 131*   < > 133*  K 5.4*   < > 4.7  CL 96*   < > 93*  CO2 18*   < > 26  GLUCOSE 154*   < > 110*  BUN 80*   < > 53*  CREATININE 7.19*   < > 5.66*  CALCIUM 8.6*   < > 8.5*  MG  --    < > 1.8  AST 10*  --   --   ALT 13  --   --   ALKPHOS 85  --   --   BILITOT 0.6  --   --    < > = values in this interval not displayed.   Cardiac Enzymes No results for input(s): TROPONINI in the last 168 hours. RADIOLOGY:  No results found. ASSESSMENT AND PLAN:  Shawn [name] is a 34 y.o. malewith PMH of ESRD  on HD, insulin-dependent DM, admitted 6/26/21due toAcute Hypoxic Respiratory Failure in the setting of Pulmonary edema/Volume Overload and questionable Necrotizing Pneumonia requiring BiPAP.  Transferred to floor on 6/28.  Acute Hypoxic Respiratory Failure in the setting of Pulmonary Edema/Volume overload and Necrotizing Pneumonia -Supplemental O2 as needed to maintain O2 saturations greater than 88% -Hemodialysis for volume removal--unable to do yday due to low BP -continue zosyn (IV vanc d/c'ed per pharm due to neg MRSA screen) -As needed bronchodilators -Prn oral Morphine -wean oxygen  down as tolerated -try to do ultrafiltration today if possible-- discussed with Dr. Juleen China  ESRD on HD TTS Hyperkalemia 2/2 ESRD--and drinking OJ. Pt refuses renal diet -HD per Nephrology   Altered Mental Status>>resolved Hx: Seizures -patient having some jerky upper extremity mild. No generalized tonic clonic seizures noted. 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 Apply Santyl to open wound.  Cover with NS moist 2x2 and secure with silicone foam.  Change daily.  Turn and reposition every two hours.   Palliative care to see patient. Multiple comorbidities   DVT prophylaxis: SCD/Compression stockings Code Status: Full code  Family Communication:  Status is: inpatient Dispo:   The patient is from: home Anticipated d/c is to: home Anticipated d/c date is: 1-3 days Patient currently is not medically stable to d/c due to: still on 3L O2, on IV abx for PNA.  Need dialysis for fluid overload. Patient continues to have vomiting every day.  TOTAL TIME TAKING CARE OF THIS PATIENT: *30* minutes.  >50% time spent on counselling and coordination of care  Note: This dictation was prepared with Dragon dictation along with smaller phrase technology. Any transcriptional errors that result from this process are unintentional.  Fritzi Mandes M.D    [facility]   CC: Primary care physician; Valera Castle, MDPatient ID: Shawn [name], male   DOB: 1985-10-14, 34 y.o.   MRN: [id number]

## 2020-01-03 NOTE — Progress Notes (Signed)
[facility]  ROUNDING NOTE   Subjective:   Seen and examined on hemodialysis. Confused and lethargic.     HEMODIALYSIS FLOWSHEET:  Blood Flow Rate (mL/min): 400 mL/min Arterial Pressure (mmHg): -180 mmHg Venous Pressure (mmHg): 140 mmHg Transmembrane Pressure (mmHg): 40 mmHg Ultrafiltration Rate (mL/min): 0.47 mL/min Dialysate Flow Rate (mL/min): 600 ml/min Conductivity: Machine : 14.2 Conductivity: Machine : 14.2 Dialysis Fluid Bolus: Normal Saline Bolus Amount (mL): 250 mL    Objective:  Vital signs in last 24 hours:  Temp:  [97.4 F (36.3 C)-98.5 F (36.9 C)] 97.4 F (36.3 C) (07/01 1700) Pulse Rate:  [79-91] 88 (07/01 1700) Resp:  [12-14] 13 (07/01 1500) BP: (114-160)/(71-94) 160/94 (07/01 1700) SpO2:  [93 %-98 %] 98 % (07/01 1248)  Weight change:  Filed Weights   12/31/19 0500 01/01/20 0500 01/02/20 0500  Weight: 55.7 kg 55.8 kg 56.5 kg    Intake/Output: I/O last 3 completed shifts: In: 466 [P.O.:770; IV Piggyback:100] Out: 431 [Other:431]   Intake/Output this shift:  Total I/O In: -  Out: 613 [Other:613]  Physical Exam: General: NAD,   Head: Normocephalic, atraumatic. Moist oral mucosal membranes  Eyes: Anicteric, PERRL  Neck: Supple, trachea midline  Lungs:  Clear to auscultation  Heart: Regular rate and rhythm  Abdomen:  Soft, nontender,   Extremities:  no peripheral edema. Right BKA  Neurologic: Nonfocal, moving all four extremities  Skin: No lesions  Access: RIJ permcath    Basic Metabolic Panel: Recent Labs  Lab 12/30/19 0500 12/30/19 0500 12/31/19 0605 12/31/19 0605 01/01/20 0507 01/02/20 0536 01/03/20 0442  NA 141  --  131*  --  132* 133* 133*  K 5.2*  --  5.4*  --  5.8* 3.8 4.7  CL 102  --  96*  --  98 94* 93*  CO2 24  --  18*  --  17* 26 26  GLUCOSE 66*  --  154*  --  124* 114* 110*  BUN 67*  --  80*  --  87* 42* 53*  CREATININE 6.71*  --  7.19*  --  8.23* 4.75* 5.66*  CALCIUM 8.4*   < > 8.6*   < > 8.2* 8.3*  8.5*  MG  --   --   --   --  1.9 1.6* 1.8  PHOS  --   --   --   --  8.3* 5.4* 8.5*   < > = values in this interval not displayed.    Liver Function Tests: Recent Labs  Lab 12/30/19 0500 12/31/19 0605  AST 12* 10*  ALT 18 13  ALKPHOS 98 85  BILITOT 0.8 0.6  PROT 6.4* 6.6  ALBUMIN 2.9* 3.0*   No results for input(s): LIPASE, AMYLASE in the last 168 hours. No results for input(s): AMMONIA in the last 168 hours.  CBC: Recent Labs  Lab 12/29/19 2121 12/29/19 2121 12/30/19 0500 12/30/19 0500 12/31/19 0605 01/01/20 0507 01/01/20 0950 01/02/20 0536 01/03/20 0442  WBC 5.5   < > 14.0*   < > 12.8* 10.4 10.8* 8.8 8.5  NEUTROABS 3.9  --  12.0*  --  8.6*  --   --   --   --   HGB 10.8*   < > 10.0*   < > 10.4* 10.0* 10.0* 10.3* 11.1*  HCT 36.3*   < > 32.4*   < > 33.3* 33.2* 32.9* 32.5* 35.6*  MCV 89.9   < > 88.3   < > 86.0 87.6 88.2 85.3 86.2  PLT 210   < > 211   < > 207 182 192 153 157   < > = values in this interval not displayed.    Cardiac Enzymes: No results for input(s): CKTOTAL, CKMB, CKMBINDEX, TROPONINI in the last 168 hours.  BNP: Invalid input(s): POCBNP  CBG: Recent Labs  Lab 01/02/20 2351 01/03/20 0421 01/03/20 0801 01/03/20 1238 01/03/20 1757  GLUCAP 71 114* 79 102* 63    Microbiology: Results for orders placed or performed during the hospital encounter of 12/29/19  SARS Coronavirus 2 by RT PCR (hospital order, performed in [facility] hospital lab) Nasopharyngeal Nasopharyngeal Swab     Status: None   Collection Time: 12/29/19 10:59 PM   Specimen: Nasopharyngeal Swab  Result Value Ref Range Status   SARS Coronavirus 2 NEGATIVE NEGATIVE Final    Comment: (NOTE) SARS-CoV-2 target nucleic acids are NOT DETECTED.  The SARS-CoV-2 RNA is generally detectable in upper and lower respiratory specimens during the acute phase of infection. The lowest concentration of SARS-CoV-2 viral copies this assay can detect is 250 copies / mL. A negative result does  not preclude SARS-CoV-2 infection and should not be used as the sole basis for treatment or other patient management decisions.  A negative result may occur with improper specimen collection / handling, submission of specimen other than nasopharyngeal swab, presence of viral mutation(s) within the areas targeted by this assay, and inadequate number of viral copies (<250 copies / mL). A negative result must be combined with clinical observations, patient history, and epidemiological information.  Fact Sheet for Patients:   StrictlyIdeas.no  Fact Sheet for Healthcare Providers: BankingDealers.co.za  This test is not yet approved or  cleared by the [place] FDA and has been authorized for detection and/or diagnosis of SARS-CoV-2 by FDA under an Emergency Use Authorization (EUA).  This EUA will remain in effect (meaning this test can be used) for the duration of the COVID-19 declaration under Section 564(b)(1) of the Act, 21 U.S.C. section 360bbb-3(b)(1), unless the authorization is terminated or revoked sooner.  Performed at [facility], [address] [place], [place] [address]   Blood culture (routine x 2)     Status: None   Collection Time: 12/29/19 10:59 PM   Specimen: BLOOD  Result Value Ref Range Status   Specimen Description BLOOD RIGHT ANTECUBITAL  Final   Special Requests   Final    BOTTLES DRAWN AEROBIC AND ANAEROBIC Blood Culture adequate volume   Culture   Final    NO GROWTH 5 DAYS Performed at [facility], [address] [place], [place] [address]    Report Status 01/03/2020 FINAL  Final  MRSA PCR Screening     Status: None   Collection Time: 12/30/19 12:25 AM   Specimen: Nasopharyngeal  Result Value Ref Range Status   MRSA by PCR NEGATIVE NEGATIVE Final    Comment:        The GeneXpert MRSA Assay (FDA approved for NASAL specimens only), is one component of a comprehensive MRSA  colonization surveillance program. It is not intended to diagnose MRSA infection nor to guide or monitor treatment for MRSA infections. Performed at [facility], [place],  [address]   C Difficile Quick Screen w PCR reflex     Status: Abnormal   Collection Time: 12/30/19  1:50 PM   Specimen: STOOL  Result Value Ref Range Status   C Diff antigen POSITIVE (A) NEGATIVE Final   C Diff toxin NEGATIVE  NEGATIVE Final   C Diff interpretation Results are indeterminate. See PCR results.  Final    Comment: Performed at [facility], [address] [place], [place] [address]  C. Diff by PCR, Reflexed     Status: Abnormal   Collection Time: 12/30/19  1:50 PM  Result Value Ref Range Status   Toxigenic C. Difficile by PCR POSITIVE (A) NEGATIVE Final    Comment: Positive for toxigenic C. difficile with little to no toxin production. Only treat if clinical presentation suggests symptomatic illness. Performed at [facility], [address] [place], [place] [address]     Coagulation Studies: No results for input(s): LABPROT, INR in the last 72 hours.  Urinalysis: No results for input(s): COLORURINE, LABSPEC, PHURINE, GLUCOSEU, HGBUR, BILIRUBINUR, KETONESUR, PROTEINUR, UROBILINOGEN, NITRITE, LEUKOCYTESUR in the last 72 hours.  Invalid input(s): APPERANCEUR    Imaging: No results found.   Medications:    sodium chloride     sodium chloride     piperacillin-tazobactam (ZOSYN)  IV 2.25 g (01/03/20 0439)    ascorbic acid  250 mg Oral BID   calcium-vitamin D  1 tablet Oral BID WC   Chlorhexidine Gluconate Cloth  6 each Topical Daily   collagenase   Topical Daily   epoetin (EPOGEN/PROCRIT) injection  4,000 Units Intravenous Q T,Th,Sa-HD   ferrous sulfate  325 mg Oral BID WC   gabapentin  300 mg Oral BID   insulin aspart  0-6 Units Subcutaneous Q4H   insulin glargine  10 Units Subcutaneous Daily   sodium chloride, sodium  chloride, acetaminophen, ALPRAZolam, alteplase, docusate sodium, heparin, ipratropium-albuterol, lidocaine (PF), lidocaine-prilocaine, loperamide, ondansetron (ZOFRAN) IV, pentafluoroprop-tetrafluoroeth, polyethylene glycol, promethazine  Assessment/ Plan:  Shawn [name] is a 34 y.o. white male with end stage renal disease on hemodialysis, hypertension, diabetes mellitus, GERD, IBS, osteomyelitis who was admitted to [facility] on 12/29/2019 for End stage renal disease on dialysis ([place]) [N18.6, Z99.2] Acute respiratory failure with hypoxia ([place]) [J96.01]   1. End Stage renal disease:  Dialysis today.   2. Hypertension: not currently on any antihypertensive agents.   3. Anemia of chronic kidney disease:   - EPO with HD treatment.   4. Secondary Hyperparathyroidism:  - Calcium carbonate with meals.    LOS: 5 Marthe Dant 7/1/20216:04 PM

## 2020-01-04 ENCOUNTER — Inpatient Hospital Stay
Admit: 2020-01-04 | Discharge: 2020-01-04 | Disposition: A | Discharge status: Home / Self Care | Payer: Medicaid Other | Attending: Pulmonary Disease | Admitting: Pulmonary Disease

## 2020-01-04 ENCOUNTER — Inpatient Hospital Stay: Payer: Medicaid Other

## 2020-01-04 DIAGNOSIS — R569 Unspecified convulsions: Secondary | ICD-10-CM

## 2020-01-04 DIAGNOSIS — J984 Other disorders of lung: Secondary | ICD-10-CM

## 2020-01-04 DIAGNOSIS — R112 Nausea with vomiting, unspecified: Secondary | ICD-10-CM

## 2020-01-04 DIAGNOSIS — Z7189 Other specified counseling: Secondary | ICD-10-CM

## 2020-01-04 DIAGNOSIS — Z515 Encounter for palliative care: Secondary | ICD-10-CM

## 2020-01-04 LAB — ECHOCARDIOGRAM LIMITED
Height: 66 in
Weight: 2052.92 oz

## 2020-01-04 LAB — GLUCOSE, CAPILLARY
Glucose-Capillary: 102 mg/dL — ABNORMAL HIGH (ref 70–99)
Glucose-Capillary: 68 mg/dL — ABNORMAL LOW (ref 70–99)
Glucose-Capillary: 101 mg/dL — ABNORMAL HIGH (ref 70–99)
Glucose-Capillary: 136 mg/dL — ABNORMAL HIGH (ref 70–99)
Glucose-Capillary: 138 mg/dL — ABNORMAL HIGH (ref 70–99)
Glucose-Capillary: 167 mg/dL — ABNORMAL HIGH (ref 70–99)
Glucose-Capillary: 133 mg/dL — ABNORMAL HIGH (ref 70–99)
Glucose-Capillary: 130 mg/dL — ABNORMAL HIGH (ref 70–99)

## 2020-01-04 IMAGING — DX DG FOOT COMPLETE 3+V*L*
3 series · 3 of 3 positions shown · non-contrast
Comparison: None.

CLINICAL DATA: Painful soft tissue wound of the plantar aspect of
the left foot.

EXAM:
LEFT FOOT - COMPLETE 3+ VIEW

[foot ap]
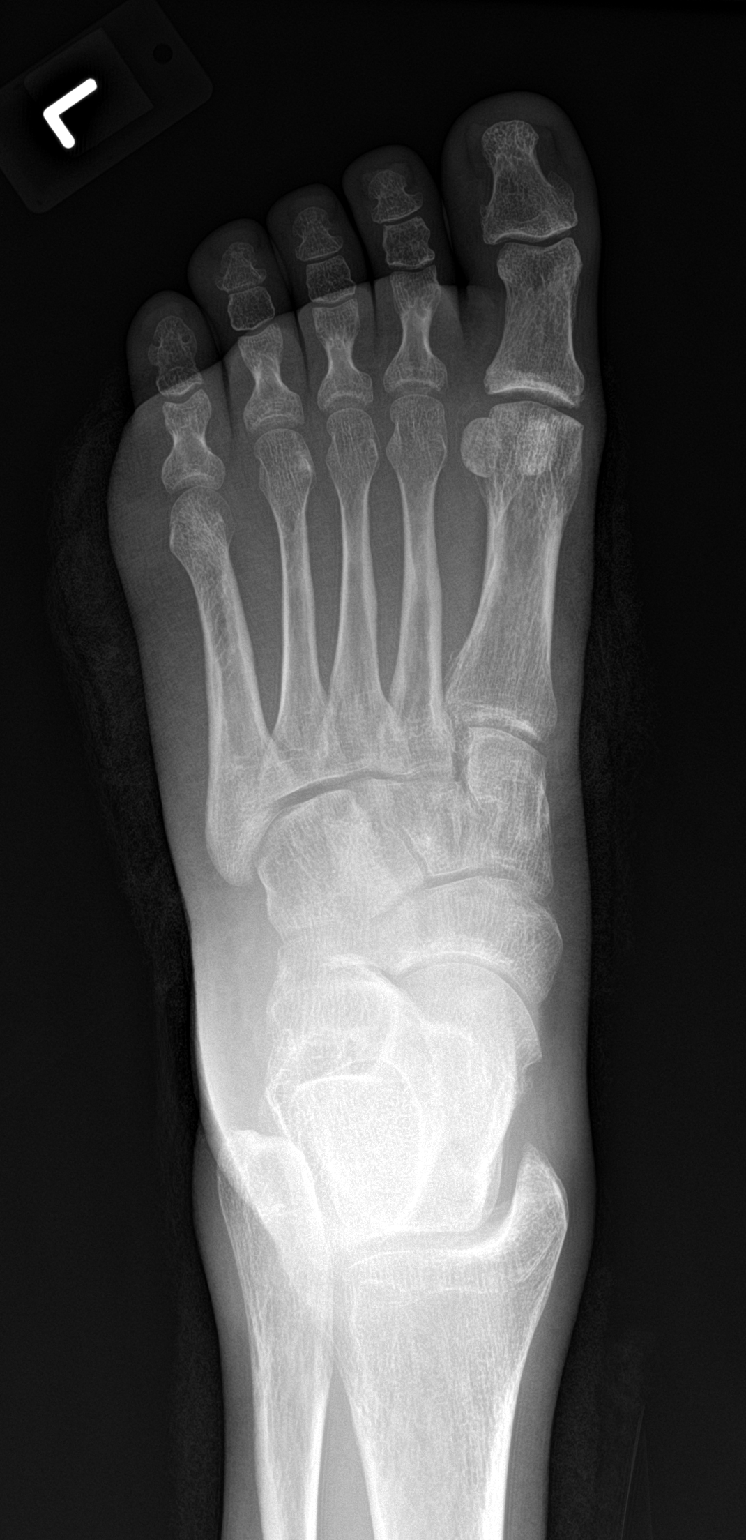

[foot obl]
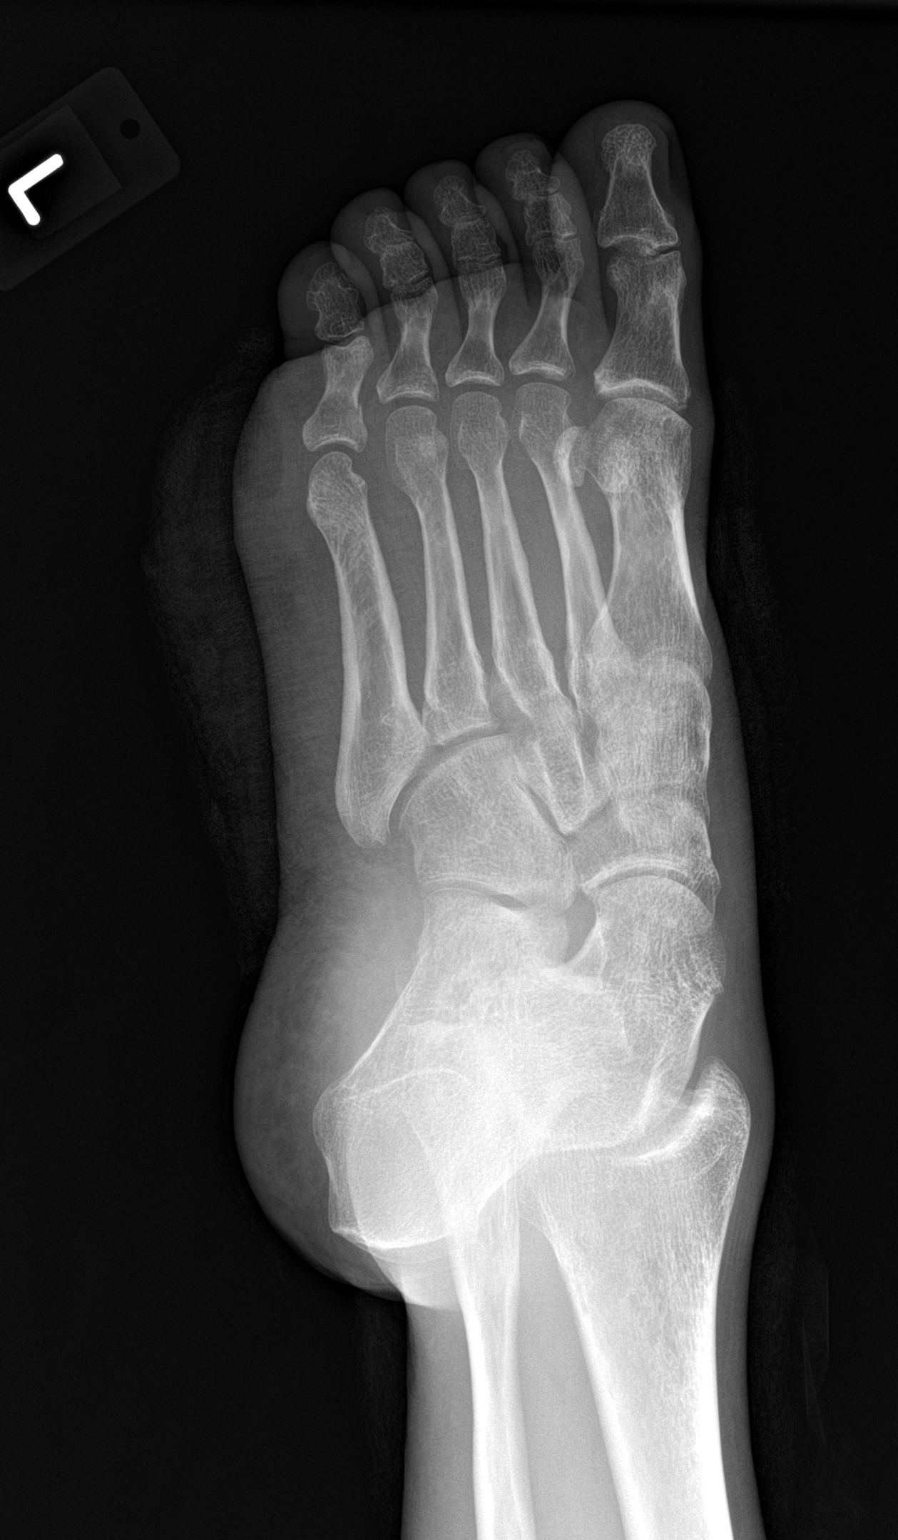

[foot lat]
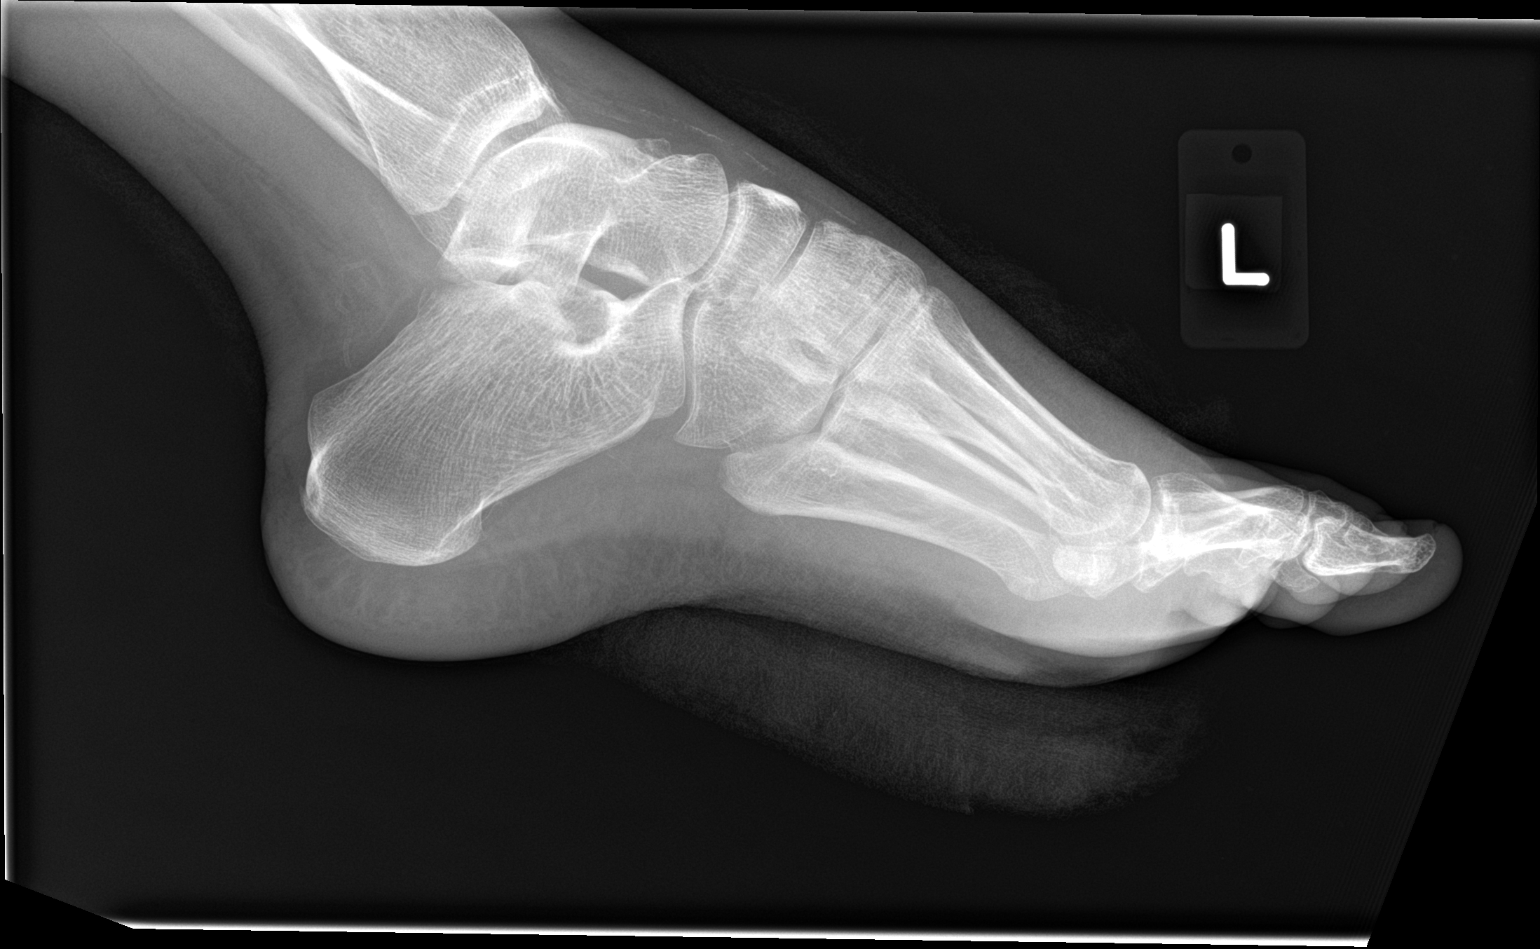

[3 of 3 positions shown; findings below may reference images not displayed]

FINDINGS: There is soft tissue swelling on the lateral plantar aspect of the
foot.

There is a small cortical disruption of the medial aspect of the
shaft of the proximal phalanx of the little toe which may represent
a subtle fracture.

Minimal arthritic changes of the first MTP joint. Arterial vascular
calcifications.
IMPRESSION: Soft tissue swelling. No discrete osteomyelitis. Possible subtle
fracture of the proximal phalanx of the little toe.

## 2020-01-04 MED ORDER — LEVETIRACETAM 500 MG PO TABS
500.0000 mg | ORAL_TABLET | Freq: Two times a day (BID) | ORAL | Status: DC
  Administered 2020-01-04 – 2020-01-12 (×17): 500 mg via ORAL
  Filled 2020-01-04 (×20): qty 1

## 2020-01-04 MED ORDER — DEXTROSE 50 % IV SOLN
INTRAVENOUS | Status: AC
  Filled 2020-01-04: qty 50

## 2020-01-04 MED ORDER — INSULIN GLARGINE 100 UNIT/ML ~~LOC~~ SOLN
5.0000 [IU] | Freq: Every day | SUBCUTANEOUS | Status: DC
  Administered 2020-01-04 – 2020-01-06 (×3): 5 [IU] via SUBCUTANEOUS
  Filled 2020-01-04 (×4): qty 0.05

## 2020-01-04 MED ORDER — TRAMADOL HCL 50 MG PO TABS: 50.0000 mg | ORAL_TABLET | Freq: Four times a day (QID) | ORAL | Status: DC | PRN

## 2020-01-04 MED ORDER — LACOSAMIDE 50 MG PO TABS
100.0000 mg | ORAL_TABLET | Freq: Two times a day (BID) | ORAL | Status: DC
  Administered 2020-01-04 – 2020-01-05 (×4): 100 mg via ORAL
  Filled 2020-01-04 (×7): qty 2

## 2020-01-04 MED ORDER — METOCLOPRAMIDE HCL 5 MG/ML IJ SOLN
5.0000 mg | Freq: Three times a day (TID) | INTRAMUSCULAR | Status: DC
  Administered 2020-01-04 – 2020-01-05 (×5): 5 mg via INTRAVENOUS
  Filled 2020-01-04 (×5): qty 2

## 2020-01-04 MED ORDER — HYDRALAZINE HCL 20 MG/ML IJ SOLN
10.0000 mg | Freq: Four times a day (QID) | INTRAMUSCULAR | Status: DC | PRN
  Administered 2020-01-04 – 2020-01-06 (×2): 10 mg via INTRAVENOUS
  Filled 2020-01-04 (×2): qty 1

## 2020-01-04 MED ORDER — OXYCODONE HCL 5 MG PO TABS
5.0000 mg | ORAL_TABLET | ORAL | Status: DC | PRN
  Administered 2020-01-04 – 2020-01-11 (×16): 5 mg via ORAL
  Filled 2020-01-04 (×18): qty 1

## 2020-01-04 MED ORDER — ALPRAZOLAM 0.25 MG PO TABS
0.2500 mg | ORAL_TABLET | Freq: Two times a day (BID) | ORAL | Status: DC
  Administered 2020-01-04 – 2020-01-05 (×3): 0.25 mg via ORAL
  Filled 2020-01-04 (×3): qty 1

## 2020-01-04 NOTE — Progress Notes (Addendum)
[facility]  ROUNDING NOTE   Subjective:   Hemodialysis treatment yesterday. Tolerated treatment well UF of 626mL  Moved to ICU this morning due to altered mental status. Patient currently awake and oriented. Eating breakfast.    Objective:  Vital signs in last 24 hours:  Temp:  [97.4 F (36.3 C)-99.2 F (37.3 C)] 98.6 F (37 C) (07/02 0800) Pulse Rate:  [79-120] 88 (07/02 0800) Resp:  [12-16] 14 (07/02 0800) BP: (108-168)/(71-94) 168/92 (07/02 0800) SpO2:  [93 %-100 %] 97 % (07/02 0800) Weight:  [58.2 kg] 58.2 kg (07/02 0800)  Weight change:  Filed Weights   01/01/20 0500 01/02/20 0500 01/04/20 0800  Weight: 55.8 kg 56.5 kg 58.2 kg    Intake/Output: I/O last 3 completed shifts: In: 480 [P.O.:480] Out: 613 [Other:613]   Intake/Output this shift:  No intake/output data recorded.  Physical Exam: General: NAD,   Head: Normocephalic, atraumatic. Moist oral mucosal membranes  Eyes: Anicteric, PERRL  Neck: Supple, trachea midline  Lungs:  Clear to auscultation  Heart: Regular rate and rhythm  Abdomen:  Soft, nontender,   Extremities:  no peripheral edema. Right BKA  Neurologic: Nonfocal, moving all four extremities  Skin: No lesions  Access: RIJ permcath    Basic Metabolic Panel: Recent Labs  Lab 12/30/19 0500 12/30/19 0500 12/31/19 0605 12/31/19 0605 01/01/20 0507 01/02/20 0536 01/03/20 0442  NA 141  --  131*  --  132* 133* 133*  K 5.2*  --  5.4*  --  5.8* 3.8 4.7  CL 102  --  96*  --  98 94* 93*  CO2 24  --  18*  --  17* 26 26  GLUCOSE 66*  --  154*  --  124* 114* 110*  BUN 67*  --  80*  --  87* 42* 53*  CREATININE 6.71*  --  7.19*  --  8.23* 4.75* 5.66*  CALCIUM 8.4*   < > 8.6*   < > 8.2* 8.3* 8.5*  MG  --   --   --   --  1.9 1.6* 1.8  PHOS  --   --   --   --  8.3* 5.4* 8.5*   < > = values in this interval not displayed.    Liver Function Tests: Recent Labs  Lab 12/30/19 0500 12/31/19 0605  AST 12* 10*  ALT 18 13  ALKPHOS 98 85   BILITOT 0.8 0.6  PROT 6.4* 6.6  ALBUMIN 2.9* 3.0*   No results for input(s): LIPASE, AMYLASE in the last 168 hours. No results for input(s): AMMONIA in the last 168 hours.  CBC: Recent Labs  Lab 12/29/19 2121 12/29/19 2121 12/30/19 0500 12/30/19 0500 12/31/19 0605 01/01/20 0507 01/01/20 0950 01/02/20 0536 01/03/20 0442  WBC 5.5   < > 14.0*   < > 12.8* 10.4 10.8* 8.8 8.5  NEUTROABS 3.9  --  12.0*  --  8.6*  --   --   --   --   HGB 10.8*   < > 10.0*   < > 10.4* 10.0* 10.0* 10.3* 11.1*  HCT 36.3*   < > 32.4*   < > 33.3* 33.2* 32.9* 32.5* 35.6*  MCV 89.9   < > 88.3   < > 86.0 87.6 88.2 85.3 86.2  PLT 210   < > 211   < > 207 182 192 153 157   < > = values in this interval not displayed.    Cardiac Enzymes: No results for input(s):  CKTOTAL, CKMB, CKMBINDEX, TROPONINI in the last 168 hours.  BNP: Invalid input(s): POCBNP  CBG: Recent Labs  Lab 01/03/20 2340 01/04/20 0456 01/04/20 0727 01/04/20 0735 01/04/20 0755  GLUCAP 96 102* 68* 136* 101*    Microbiology: Results for orders placed or performed during the hospital encounter of 12/29/19  SARS Coronavirus 2 by RT PCR (hospital order, performed in [facility] hospital lab) Nasopharyngeal Nasopharyngeal Swab     Status: None   Collection Time: 12/29/19 10:59 PM   Specimen: Nasopharyngeal Swab  Result Value Ref Range Status   SARS Coronavirus 2 NEGATIVE NEGATIVE Final    Comment: (NOTE) SARS-CoV-2 target nucleic acids are NOT DETECTED.  The SARS-CoV-2 RNA is generally detectable in upper and lower respiratory specimens during the acute phase of infection. The lowest concentration of SARS-CoV-2 viral copies this assay can detect is 250 copies / mL. A negative result does not preclude SARS-CoV-2 infection and should not be used as the sole basis for treatment or other patient management decisions.  A negative result may occur with improper specimen collection / handling, submission of specimen other than  nasopharyngeal swab, presence of viral mutation(s) within the areas targeted by this assay, and inadequate number of viral copies (<250 copies / mL). A negative result must be combined with clinical observations, patient history, and epidemiological information.  Fact Sheet for Patients:   StrictlyIdeas.no  Fact Sheet for Healthcare Providers: BankingDealers.co.za  This test is not yet approved or  cleared by the [place] FDA and has been authorized for detection and/or diagnosis of SARS-CoV-2 by FDA under an Emergency Use Authorization (EUA).  This EUA will remain in effect (meaning this test can be used) for the duration of the COVID-19 declaration under Section 564(b)(1) of the Act, 21 U.S.C. section 360bbb-3(b)(1), unless the authorization is terminated or revoked sooner.  Performed at [facility], [address] [place], [place] [address]   Blood culture (routine x 2)     Status: None   Collection Time: 12/29/19 10:59 PM   Specimen: BLOOD  Result Value Ref Range Status   Specimen Description BLOOD RIGHT ANTECUBITAL  Final   Special Requests   Final    BOTTLES DRAWN AEROBIC AND ANAEROBIC Blood Culture adequate volume   Culture   Final    NO GROWTH 5 DAYS Performed at [facility], [address] [place], [place] [address]    Report Status 01/03/2020 FINAL  Final  MRSA PCR Screening     Status: None   Collection Time: 12/30/19 12:25 AM   Specimen: Nasopharyngeal  Result Value Ref Range Status   MRSA by PCR NEGATIVE NEGATIVE Final    Comment:        The GeneXpert MRSA Assay (FDA approved for NASAL specimens only), is one component of a comprehensive MRSA colonization surveillance program. It is not intended to diagnose MRSA infection nor to guide or monitor treatment for MRSA infections. Performed at [facility], [place], [place] [address]   C Difficile Quick  Screen w PCR reflex     Status: Abnormal   Collection Time: 12/30/19  1:50 PM   Specimen: STOOL  Result Value Ref Range Status   C Diff antigen POSITIVE (A) NEGATIVE Final   C Diff toxin NEGATIVE NEGATIVE Final   C Diff interpretation Results are indeterminate. See PCR results.  Final    Comment: Performed at [facility], [address] [place], [place] [address]  C. Diff by PCR,  Reflexed     Status: Abnormal   Collection Time: 12/30/19  1:50 PM  Result Value Ref Range Status   Toxigenic C. Difficile by PCR POSITIVE (A) NEGATIVE Final    Comment: Positive for toxigenic C. difficile with little to no toxin production. Only treat if clinical presentation suggests symptomatic illness. Performed at [facility], [address] [place], [place] [address]     Coagulation Studies: No results for input(s): LABPROT, INR in the last 72 hours.  Urinalysis: No results for input(s): COLORURINE, LABSPEC, PHURINE, GLUCOSEU, HGBUR, BILIRUBINUR, KETONESUR, PROTEINUR, UROBILINOGEN, NITRITE, LEUKOCYTESUR in the last 72 hours.  Invalid input(s): APPERANCEUR    Imaging: DG Chest Port 1 View  Result Date: 01/04/2020 CLINICAL DATA:  Shortness of breath. EXAM: PORTABLE CHEST 1 VIEW COMPARISON:  CT 12/29/2019.  Chest x-ray 12/29/2019. FINDINGS: Dual-lumen central catheter stable position. Cardiomegaly. Diffuse prominent bilateral pulmonary infiltrates/edema again noted. Cavitary lesion right upper lung again noted. Progression of left-sided pleural effusion. Stable right-sided pleural thickening. No pneumothorax. IMPRESSION: 1.  Dual-lumen catheter stable position. 2.  Stable cardiomegaly. 3. Diffuse prominent bilateral pulmonary infiltrates/edema again noted. Cavitary lesion right upper lung again noted. Progression of left-sided pleural effusion. Electronically Signed   By: [place]   On: 01/04/2020 07:45     Medications:   . sodium chloride    . sodium chloride    .  piperacillin-tazobactam (ZOSYN)  IV 2.25 g (01/04/20 0509)   . ascorbic acid  250 mg Oral BID  . calcium-vitamin D  1 tablet Oral BID WC  . Chlorhexidine Gluconate Cloth  6 each Topical Daily  . collagenase   Topical Daily  . epoetin (EPOGEN/PROCRIT) injection  4,000 Units Intravenous Q T,Th,Sa-HD  . ferrous sulfate  325 mg Oral BID WC  . gabapentin  300 mg Oral BID  . insulin aspart  0-6 Units Subcutaneous Q4H  . insulin glargine  5 Units Subcutaneous Daily  . lacosamide  100 mg Oral BID  . levETIRAcetam  500 mg Oral BID  . metoCLOPramide (REGLAN) injection  5 mg Intravenous Q8H   sodium chloride, sodium chloride, acetaminophen, alteplase, docusate sodium, heparin, ipratropium-albuterol, lidocaine (PF), lidocaine-prilocaine, loperamide, ondansetron (ZOFRAN) IV, oxyCODONE, pentafluoroprop-tetrafluoroeth, polyethylene glycol  Assessment/ Plan:  [name] is a 34 y.o. white male with end stage renal disease on hemodialysis, hypertension, diabetes mellitus, GERD, IBS, osteomyelitis who was admitted to [facility] on 12/29/2019 for End stage renal disease on dialysis ([place]) [N18.6, Z99.2] Acute respiratory failure with hypoxia ([place]) [J96.01]   1. End Stage renal disease:  TTS schedule. No indication for dialysis today.   2. Hypertension: not currently on any antihypertensive agents. Elevated at 168/32 Home regimen of amlodipine, carvedilol, hydralazine, and valsartan  3. Anemia of chronic kidney disease:  Hemoglobin 11.1 - Discontinue EPO  4. Secondary Hyperparathyroidism:  - Calcium carbonate with meals.   5. Seizure disorder - restarted home regimen of Vimpat and Keppra.    LOS: 6 Casie Sturgeon 7/2/20218:57 AM

## 2020-01-04 NOTE — Progress Notes (Signed)
*  PRELIMINARY RESULTS* Echocardiogram 2D Echocardiogram has been performed.  Shawn [name] 01/04/2020, 9:36 AM

## 2020-01-04 NOTE — Progress Notes (Signed)
SHIFT SUMMARY:  Patient brought to ICU this morning after Rapid Response was called due to desaturation and seizure activity. The patient remains alert and oriented throughout this RN's shift. Pt states he has chronic back pain d/t sacral wound and limited mobility. Pt reports fracturing his left foot during a fall that occurred several months ago at [facility]. States he was recently discharged from [facility] after a 405 day stay. Pt reports he takes seizure medications at home but has not been taking them since his admission to [facility]. Home seizure medications were ordered and given to patient this morning. Patient remains on 2-4L nasal canula. Patient frequently calls his father to update him. Patient had several episodes of incontinentence containing loose liquid stools this afternoon so a flexiseal was placed.

## 2020-01-04 NOTE — Consult Note (Signed)
Consultation Note Date: 01/04/2020   Patient Name: Shawn [name]  DOB: 08-25-1985  MRN: [id number]  Age / Sex: 34 y.o., male  PCP: Valera Castle, MD Referring Physician: Fritzi Mandes, MD  Reason for Consultation: Establishing goals of care  HPI/Patient Profile: 34 y.o. male with PMH of ESRD on HD admitted 12/29/19 due to Acute Hypoxic Respiratory Failure in the setting of Pulmonary edema/Volume Overload and questionable Necrotizing Pneumonia requiring BiPAP.  Nephrology has been consulted for urgent HD.   Clinical Assessment and Goals of Care: Patient is resting in bed eating lunch. He states he was admitted for 400+ days at [facility]. He states he was intubated and trached, and went through full decannulation process there; stoma is currently healed over. He states he started dialysis there.  He was released 2 weeks ago.   He states he has a 58 year old son who is staying with the patient's father.   We discussed his diagnosis, prognosis, GOC, EOL wishes disposition and options.  A detailed discussion was had today regarding advanced directives.  Concepts specific to code status, artifical feeding and hydration, IV antibiotics and rehospitalization were discussed.  The difference between an aggressive medical intervention path and a comfort care path was discussed.  Values and goals of care important to patient and family were attempted to be elicited.  Discussed limitations of medical interventions to prolong quality of life in some situations and discussed the concept of human mortality.  We discussed his QOL. He is trying to adjust to fluid and food restrictions. He states he had a lot of pain from his sacral wound but that has improved. He states he uses prescribed pain medications and smokes marijuana.   He states he does not want to die, but does not believe he would ever want intubation or to be  trached again. He states he will have to consider how he feels about his care moving forward. Patient began using his cell phone and conversation was ended.         SUMMARY OF RECOMMENDATIONS   Patient does not believe he would want intubation or to be trached in the future, ut is unsure. He states he will need to consider his options.   Recommend palliative at D/C for continued conversations.       Primary Diagnoses: Present on Admission: . Chronic foot pain (Primary Area of Pain) (Bilateral) (L>R) . Chronic pain syndrome . MDD (major depressive disorder) . Diarrhea . Decubitus ulcer of coccyx, unspecified pressure ulcer stage . Necrotizing pneumonia ([place])   I have reviewed the medical record, interviewed the patient and family, and examined the patient. The following aspects are pertinent.  Past Medical History:  Diagnosis Date  . CKD (chronic kidney disease)   . Diabetes mellitus without complication ([place])   . GERD (gastroesophageal reflux disease)   . HTN (hypertension)   . IBS (irritable bowel syndrome)   . Osteomyelitis [facility])    Social History   Socioeconomic History  . Marital status: Single  Spouse name: Not on file  . Number of children: Not on file  . Years of education: Not on file  . Highest education level: Not on file  Occupational History  . Not on file  Tobacco Use  . Smoking status: Never Smoker  . Smokeless tobacco: Never Used  Vaping Use  . Vaping Use: Never used  Substance and Sexual Activity  . Alcohol use: No  . Drug use: Yes    Types: Marijuana, PCP  . Sexual activity: Not on file  Other Topics Concern  . Not on file  Social History Narrative  . Not on file   Social Determinants of Health   Financial Resource Strain:   . Difficulty of Paying Living Expenses:   Food Insecurity:   . Worried About Charity fundraiser in the Last Year:   . Arboriculturist in the Last Year:   Transportation Needs:   . Film/video editor  (Medical):   Marland Kitchen Lack of Transportation (Non-Medical):   Physical Activity:   . Days of Exercise per Week:   . Minutes of Exercise per Session:   Stress:   . Feeling of Stress :   Social Connections:   . Frequency of Communication with Friends and Family:   . Frequency of Social Gatherings with Friends and Family:   . Attends Religious Services:   . Active Member of Clubs or Organizations:   . Attends Archivist Meetings:   Marland Kitchen Marital Status:    Family History  Problem Relation Age of Onset  . Diabetes Mother   . Ovarian cancer Mother   . Healthy Father    Scheduled Meds: . ALPRAZolam  0.25 mg Oral BID  . ascorbic acid  250 mg Oral BID  . calcium-vitamin D  1 tablet Oral BID WC  . Chlorhexidine Gluconate Cloth  6 each Topical Daily  . collagenase   Topical Daily  . ferrous sulfate  325 mg Oral BID WC  . gabapentin  300 mg Oral BID  . insulin aspart  0-6 Units Subcutaneous Q4H  . insulin glargine  5 Units Subcutaneous Daily  . lacosamide  100 mg Oral BID  . levETIRAcetam  500 mg Oral BID  . metoCLOPramide (REGLAN) injection  5 mg Intravenous Q8H   Continuous Infusions: . sodium chloride    . sodium chloride    . piperacillin-tazobactam (ZOSYN)  IV Stopped (01/04/20 0539)   PRN Meds:.sodium chloride, sodium chloride, acetaminophen, alteplase, docusate sodium, heparin, hydrALAZINE, ipratropium-albuterol, lidocaine (PF), lidocaine-prilocaine, loperamide, ondansetron (ZOFRAN) IV, oxyCODONE, pentafluoroprop-tetrafluoroeth, polyethylene glycol Medications Prior to Admission:  Prior to Admission medications   Medication Sig Start Date End Date Taking? Authorizing Provider  amLODipine (NORVASC) 10 MG tablet Take 10 mg by mouth daily.   Yes [provider]  carvedilol (COREG) 25 MG tablet Take 25 mg by mouth 2 (two) times daily with a meal.   Yes [provider]  folic acid (FOLVITE) 1 MG tablet Take 1 mg by mouth daily.   Yes [provider]    hydrALAZINE (APRESOLINE) 25 MG tablet Take 25 mg by mouth every 8 (eight) hours.   Yes [provider]  insulin lispro (HUMALOG) 100 UNIT/ML injection Inject 2 Units into the skin 3 (three) times daily with meals.   Yes [provider]  Lacosamide (VIMPAT) 100 MG TABS Take 100 mg by mouth 2 (two) times daily.   Yes [provider]  levETIRAcetam (KEPPRA) 500 MG tablet Take 500 mg  by mouth 2 (two) times daily.   Yes [provider]  metoCLOPramide (REGLAN) 5 MG tablet Take 5 mg by mouth 3 (three) times daily before meals.   Yes [provider]  mirtazapine (REMERON) 30 MG tablet Take 30 mg by mouth at bedtime.   Yes [provider]  oxyCODONE (OXY IR/ROXICODONE) 5 MG immediate release tablet Take 5 mg by mouth every 8 (eight) hours as needed for severe pain.   Yes [provider]  pantoprazole (PROTONIX) 40 MG tablet Take 40 mg by mouth daily.   Yes [provider]  QUEtiapine (SEROQUEL) 25 MG tablet Take 25 mg by mouth at bedtime.   Yes [provider]  sevelamer carbonate (RENVELA) 800 MG tablet Take 800 mg by mouth 3 (three) times daily with meals.   Yes [provider]  valsartan (DIOVAN) 320 MG tablet Take 320 mg by mouth daily.   Yes [provider]  insulin glargine (LANTUS) 100 UNIT/ML injection Inject 0.3 mLs (30 Units total) into the skin daily. Patient taking differently: Inject 35 Units into the skin 2 (two) times daily.  08/18/18   Gladstone Lighter, MD   Allergies  Allergen Reactions  . Banana Hives, Nausea And Vomiting and Rash  . Keflex [Cephalexin] Rash    No swelling- also taken penicillin without any issue.  . Sulfa Antibiotics Anaphylaxis  . Grapeseed Extract [Nutritional Supplements] Itching  . Shellfish Allergy Hives    "ALL SEAFOOD"  . Grape Seed Rash   Review of Systems  All other systems reviewed and are negative.   Physical Exam Pulmonary:     Effort: Pulmonary  effort is normal.  Neurological:     Mental Status: He is alert.     Vital Signs: BP (!) 160/90   Pulse (!) 102   Temp 98.6 F (37 C) (Oral)   Resp 15   Ht 5\' 6"  (1.676 m)   Wt 58.2 kg   SpO2 91%   BMI 20.71 kg/m  Pain Scale: 0-10 POSS *See Group Information*: 1-Acceptable,Awake and alert Pain Score: Asleep   SpO2: SpO2: 91 % O2 Device:SpO2: 91 % O2 Flow Rate: .O2 Flow Rate (L/min): 3 L/min  IO: Intake/output summary:   Intake/Output Summary (Last 24 hours) at 01/04/2020 1440 Last data filed at 01/04/2020 0900 Gross per 24 hour  Intake 450 ml  Output 613 ml  Net -163 ml    LBM: Last BM Date: 01/03/20 Baseline Weight: Weight: 52.2 kg Most recent weight: Weight: 58.2 kg     Palliative Assessment/Data:     Time In: 2:20 Time Out: 2:50 Time Total: 30 min Greater than 50%  of this time was spent counseling and coordinating care related to the above assessment and plan.  Signed by: Asencion Gowda, NP   Please contact Palliative Medicine Team phone at [phone] for questions and concerns.  For individual provider: See Shea Evans

## 2020-01-04 NOTE — Significant Event (Signed)
Rapid Response Event Note  Overview: Time Called: 0727 Arrival Time: 0727 Event Type: Neurologic, Respiratory (seizure and oxygen desaturation)  Initial Focused Assessment: Rapid response RN arrived in patient's room with patient sitting up in bed with a non-rebreather mask on and 2C nurses at bedside. Per Misty RN on 2C, patient had seizure like activity and became cyanotic with decreased saturations. Seizure activity was less than 2 minutes but exact time not known. No medications given to break seizure, patient ended up ending seizure on his own per report. Oxygen saturations 100% on NRB and patient transitioned to 2 L nasal cannula still saturating 100% at the time. Patient was awake and conversing with staff during time of rapid response. Dr. Posey Pronto arrived shortly at bedside to assess patient.   Interventions: none  Plan of Care (if not transferred): Patient transferred to [facility]. Misty RN from [facility] gave report to [employer] in ICU. Patient called his father himself to notify him of his transfer.  Event Summary: Name of Physician Notified: Dr. Posey Pronto at [phone]    at    Outcome: Transferred (Comment) (ICU 2)  Event End Time: 0800  Laurance Flatten [place]

## 2020-01-04 NOTE — Progress Notes (Signed)
[place] [facility] [place] NAME: Shawn [name]    MR#:  [id number]  DATE OF BIRTH:  January 11, 1986  SUBJECTIVE:  Rapid response was called this am--per RN pt groggy, dropped sats in the 60's, placed on NRB, blank stares but comes out of it quickly.  Currently on 3.5 liter [place] sats 96% Pt has similar issue yday CXT shows worsening pulm edema vs infiltrates REVIEW OF SYSTEMS:   Review of Systems  Constitutional: Negative for chills, fever and weight loss.  HENT: Negative for ear discharge, ear pain and nosebleeds.   Eyes: Negative for blurred vision, pain and discharge.  Respiratory: Negative for sputum production, shortness of breath, wheezing and stridor.   Cardiovascular: Negative for chest pain, palpitations, orthopnea and PND.  Gastrointestinal: Positive for nausea and vomiting. Negative for abdominal pain and diarrhea.  Genitourinary: Negative for frequency and urgency.  Musculoskeletal: Negative for back pain and joint pain.  Neurological: Positive for weakness. Negative for sensory change, speech change and focal weakness.  Psychiatric/Behavioral: Negative for depression and hallucinations. The patient is not nervous/anxious.    Tolerating Diet:yes Tolerating PT: bed bound/WC  DRUG ALLERGIES:   Allergies  Allergen Reactions  . Banana Hives, Nausea And Vomiting and Rash  . Keflex [Cephalexin] Rash    No swelling- also taken penicillin without any issue.  . Sulfa Antibiotics Anaphylaxis  . Grapeseed Extract [Nutritional Supplements] Itching  . Shellfish Allergy Hives    "ALL SEAFOOD"  . Grape Seed Rash    VITALS:  Blood pressure 108/80, pulse (!) 120, temperature 99.2 F (37.3 C), temperature source Oral, resp. rate 16, height 5\' 6"  (1.676 m), weight 56.5 kg, SpO2 100 %.  PHYSICAL EXAMINATION:   Physical Exam  GENERAL:  34 y.o.-year-old patient lying in the bed with no acute distress. thin cachectic EYES: Pupils equal, round,  reactive to light and accommodation. No scleral icterus.   HEENT: Head atraumatic, normocephalic. Oropharynx and nasopharynx clear.  NECK:  Supple, no jugular venous distention. No thyroid enlargement, no tenderness.  LUNGS: distant and corase breath sounds bilaterally, no wheezing, rales, rhonchi. No use of accessory muscles of respiration.  CARDIOVASCULAR: S1, S2 normal. No murmurs, rubs, or gallops.  ABDOMEN: Soft, nontender, nondistended. Bowel sounds present. No organomegaly or mass.  EXTREMITIES: right BKA Left foot drop/atrophy LE NEUROLOGIC: Cranial nerves II through XII are intact. Grossly nonfocalPSYCHIATRIC:  patient is alert and oriented x 3.  SKIN: No obvious rash, lesion, or ulcer.   LABORATORY PANEL:  CBC Recent Labs  Lab 01/03/20 0442  WBC 8.5  HGB 11.1*  HCT 35.6*  PLT 157    Chemistries  Recent Labs  Lab 12/31/19 0605 01/01/20 0507 01/03/20 0442  NA 131*   < > 133*  K 5.4*   < > 4.7  CL 96*   < > 93*  CO2 18*   < > 26  GLUCOSE 154*   < > 110*  BUN 80*   < > 53*  CREATININE 7.19*   < > 5.66*  CALCIUM 8.6*   < > 8.5*  MG  --    < > 1.8  AST 10*  --   --   ALT 13  --   --   ALKPHOS 85  --   --   BILITOT 0.6  --   --    < > = values in this interval not displayed.   Cardiac Enzymes No results for input(s): TROPONINI in the last 168  hours. RADIOLOGY:  DG Chest Port 1 View  Result Date: 01/04/2020 CLINICAL DATA:  Shortness of breath. EXAM: PORTABLE CHEST 1 VIEW COMPARISON:  CT 12/29/2019.  Chest x-ray 12/29/2019. FINDINGS: Dual-lumen central catheter stable position. Cardiomegaly. Diffuse prominent bilateral pulmonary infiltrates/edema again noted. Cavitary lesion right upper lung again noted. Progression of left-sided pleural effusion. Stable right-sided pleural thickening. No pneumothorax. IMPRESSION: 1.  Dual-lumen catheter stable position. 2.  Stable cardiomegaly. 3. Diffuse prominent bilateral pulmonary infiltrates/edema again noted. Cavitary lesion  right upper lung again noted. Progression of left-sided pleural effusion. Electronically Signed   By: Marcello Moores  Register   On: 01/04/2020 07:45   ASSESSMENT AND PLAN:  Shawn [name] is a 34 y.o. malewith PMH of ESRD on HD, insulin-dependent DM, admitted 6/26/21due toAcute Hypoxic Respiratory Failure in the setting of Pulmonary edema/Volume Overload and questionable Necrotizing Pneumonia requiring BiPAP.  Transferred to floor on 6/28.  Acute Hypoxic Respiratory Failure in the setting of Pulmonary Edema/Volume overload and Necrotizing Pneumonia/infiltrates -7/2>>will move to Stepdown given frequent destaurations -Hemodialysis for volume removal--unable to do yday due to low BP--600cc UF -continue zosyn (IV vanc d/c'ed per pharm due to neg MRSA screen) -As needed bronchodilators -wean oxygen down as tolerated -d/w Dr Toy Cookey- 7/2>>CXR looks worsening infiltrate?aspiration?pulmonary edema  ESRD on HD TTS Hyperkalemia 2/2 ESRD -HD per Nephrology   Altered Mental Status/acute Encephalopathy/ intermittent with shaky spell and hypoxia Hx:? Seizures -patient having some jerky upper extremity mild. No generalized tonic clonic seizures noted. 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 Apply Santyl to open wound.  Cover with NS moist 2x2 and secure  with silicone foam.  Change daily.  Turn and reposition every two hours.   Palliative care to see patient. Multiple comorbidities   DVT prophylaxis: SCD/Compression stockings Code Status: Full code  Family Communication:  Status is: inpatient Dispo:   The patient is from: home Anticipated d/c is to: home Anticipated d/c date is: 1-3 days Patient currently is not medically stable to d/c due to: still on 3L O2, on IV abx for PNA.  Need dialysis for fluid overload. Patient continues to have vomiting every day.  TOTAL critical TIME TAKING CARE OF THIS PATIENT: *35* minutes.  >50% time spent on counselling and coordination of care  Note: This dictation was prepared with Dragon dictation along with smaller phrase technology. Any transcriptional errors that result from this process are unintentional.  Fritzi Mandes M.D    [facility]   CC: Primary care physician; Valera Castle, MDPatient ID: Shawn [name], male   DOB: 1986-05-22, 34 y.o.   MRN: [id number]

## 2020-01-04 NOTE — Progress Notes (Signed)
Pharmacy Antibiotic Note  Shawn [name] is a 34 y.o. male admitted on 12/29/2019 with pneumonia.  Pharmacy has been consulted for Zosyn dosing.  Plan: Continue Zosyn 2.25 g IV q8h (HD dosing). Today is day 6 of antibiotics.    Height: 5\' 6"  (167.6 cm) Weight: 58.2 kg (128 lb 4.9 oz) IBW/kg (Calculated) : 63.8  Temp (24hrs), Avg:98.3 F (36.8 C), Min:97.4 F (36.3 C), Max:99.2 F (37.3 C)  Recent Labs  Lab 12/29/19 2121 12/29/19 2259 12/30/19 0041 12/30/19 0500 12/30/19 0500 12/31/19 0605 01/01/20 0507 01/01/20 0950 01/02/20 0536 01/03/20 0442  WBC   < >  --   --  14.0*   < > 12.8* 10.4 10.8* 8.8 8.5  CREATININE   < >  --   --  6.71*  --  7.19* 8.23*  --  4.75* 5.66*  LATICACIDVEN  --  1.0 0.9  --   --   --   --   --   --   --    < > = values in this interval not displayed.    Estimated Creatinine Clearance: 15.4 mL/min (A) (by C-G formula based on SCr of 5.66 mg/dL (H)).    Allergies  Allergen Reactions  . Banana Hives, Nausea And Vomiting and Rash  . Keflex [Cephalexin] Rash    No swelling- also taken penicillin without any issue.  . Sulfa Antibiotics Anaphylaxis  . Grapeseed Extract [Nutritional Supplements] Itching  . Shellfish Allergy Hives    "ALL SEAFOOD"  . Grape Seed Rash      Thank you for allowing pharmacy to be a part of this patient's care.  Tawnya Crook, PharmD 01/04/2020 12:00 PM

## 2020-01-04 NOTE — Progress Notes (Signed)
°   01/04/20 0700  Clinical Encounter Type  Visited With Health care provider  Visit Type Initial;Other (Comment)  Referral From Nurse  Consult/Referral To Chaplain  Chaplain received page for RR and made pastoral presence known. Care team was in patient room, Chaplain told nurse to call if needed.

## 2020-01-04 NOTE — Significant Event (Signed)
During bedside report, CCMD alerted [facility] RN of patient O2 Sats in 70's. Upon entering the room patient was alert, sitting up in bed, and did not appear to be in any respiratory distress. Patient currently on [place] 4L. Patient stated he was nausous and having pain in left ankle. Patient then quickly shifted to a blank stare and verbally not responding to [facility]. Patient began to twitch and became cyanotic. Patient did not stop breathing and carotid pulse palpated and present. Patient placed on NRB, rapid response called, and MD paged.   Fuller Mandril, RN

## 2020-01-04 NOTE — Plan of Care (Signed)
  Problem: Activity: Goal: Risk for activity intolerance will decrease Outcome: Not Progressing   Problem: Nutrition: Goal: Adequate nutrition will be maintained Outcome: Not Progressing   Problem: Coping: Goal: Level of anxiety will decrease Outcome: Not Progressing   

## 2020-01-04 NOTE — Consult Note (Signed)
Reason for Consult: seizure activity  Requesting Physician: Dr. Posey Pronto   CC: seizures  HPI: Shawn [name] is an 34 y.o. male with PMH of ESRD on HD admitted 12/29/19 due to Acute Hypoxic Respiratory Failure in the setting of Pulmonary edema/Volume Overload and questionable Necrotizing Pneumonia requiring BiPAP.  Nephrology was consulted for urgent HD. Overnight 7/2 had breakthrough seizures and transferred to stepdown. At baseline pt is on Keppra and Vimpat. Currently back to baseline.     Past Medical History:  Diagnosis Date  . CKD (chronic kidney disease)   . Diabetes mellitus without complication ([place])   . GERD (gastroesophageal reflux disease)   . HTN (hypertension)   . IBS (irritable bowel syndrome)   . [facility]     Past Surgical History:  Procedure Laterality Date  . ABDOMINAL AORTOGRAM W/LOWER EXTREMITY Right 12/23/2017   Procedure: ABDOMINAL AORTOGRAM W/LOWER EXTREMITY;  Surgeon: Katha Cabal, MD;  Location: [place] [facility];  Service: Cardiovascular;  Laterality: Right;  . ACHILLES TENDON SURGERY Bilateral 06/16/2018   Procedure: ACHILLES LENGTHENING/KIDNER;  Surgeon: Albertine Patricia, DPM;  Location: [facility];  Service: Podiatry;  Laterality: Bilateral;  . AMPUTATION Right 12/24/2017   Procedure: AMPUTATION RAY;  Surgeon: Sharlotte Alamo, DPM;  Location: [facility];  Service: Podiatry;  Laterality: Right;  . AMPUTATION Left 04/06/2018   Procedure: AMPUTATION RAY;  Surgeon: Sharlotte Alamo, DPM;  Location: [facility];  Service: Podiatry;  Laterality: Left;  . AMPUTATION Left 04/09/2018   Procedure: AMPUTATION RAY;  Surgeon: Sharlotte Alamo, DPM;  Location: [facility];  Service: Podiatry;  Laterality: Left;  . AMPUTATION Right 10/18/2018   Procedure: AMPUTATION BELOW KNEE;  Surgeon: Katha Cabal, MD;  Location: [facility];  Service: Vascular;  Laterality: Right;  . APPLICATION OF WOUND VAC Right 12/24/2017   Procedure: APPLICATION OF WOUND VAC;  Surgeon: Sharlotte Alamo, DPM;   Location: [facility];  Service: Podiatry;  Laterality: Right;  . APPLICATION OF WOUND VAC Right 01/01/2018   Procedure: APPLICATION OF WOUND VAC;  Surgeon: Albertine Patricia, DPM;  Location: [facility];  Service: Podiatry;  Laterality: Right;  . BONE EXCISION Bilateral 06/16/2018   Procedure: BONE EXCISION AND SOFT TISSUE;  Surgeon: Albertine Patricia, DPM;  Location: [facility];  Service: Podiatry;  Laterality: Bilateral;  . IRRIGATION AND DEBRIDEMENT FOOT Right 12/20/2017   Procedure: IRRIGATION AND DEBRIDEMENT FOOT;  Surgeon: Sharlotte Alamo, DPM;  Location: [facility];  Service: Podiatry;  Laterality: Right;  . IRRIGATION AND DEBRIDEMENT FOOT Right 12/24/2017   Procedure: IRRIGATION AND DEBRIDEMENT FOOT;  Surgeon: Sharlotte Alamo, DPM;  Location: [facility];  Service: Podiatry;  Laterality: Right;  . IRRIGATION AND DEBRIDEMENT FOOT Right 01/01/2018   Procedure: IRRIGATION AND DEBRIDEMENT FOOT-SKIN,SOFT TISSUE AND BONE;  Surgeon: Albertine Patricia, DPM;  Location: [facility];  Service: Podiatry;  Laterality: Right;  . IRRIGATION AND DEBRIDEMENT FOOT Left 04/06/2018   Procedure: IRRIGATION AND DEBRIDEMENT FOOT;  Surgeon: Sharlotte Alamo, DPM;  Location: [facility];  Service: Podiatry;  Laterality: Left;  . IRRIGATION AND DEBRIDEMENT FOOT Right 09/20/2018   Procedure: IRRIGATION AND DEBRIDEMENT FOOT;  Surgeon: Sharlotte Alamo, DPM;  Location: [facility];  Service: Podiatry;  Laterality: Right;  . LOWER EXTREMITY ANGIOGRAPHY Left 04/06/2018   Procedure: Lower Extremity Angiography;  Surgeon: Algernon Huxley, MD;  Location: [place] [facility];  Service: Cardiovascular;  Laterality: Left;  . LOWER EXTREMITY ANGIOGRAPHY Bilateral 10/13/2018   Procedure: Lower Extremity Angiography;  Surgeon: Katha Cabal, MD;  Location: [place] [facility];  Service: Cardiovascular;  Laterality: Bilateral;    Family History  Problem Relation Age of Onset  . Diabetes Mother   . Ovarian cancer Mother   . Healthy Father     Social History:   reports that he has never smoked. He has never used smokeless tobacco. He reports current drug use. Drugs: Marijuana and PCP. He reports that he does not drink alcohol.  Allergies  Allergen Reactions  . Banana Hives, Nausea And Vomiting and Rash  . Keflex [Cephalexin] Rash    No swelling- also taken penicillin without any issue.  . Sulfa Antibiotics Anaphylaxis  . Grapeseed Extract [Nutritional Supplements] Itching  . Shellfish Allergy Hives    "ALL SEAFOOD"  . Grape Seed Rash    Medications: I have reviewed the patient's current medications.    Physical Examination: Blood pressure (!) 144/83, pulse 94, temperature 98.6 F (37 C), temperature source Oral, resp. rate (!) 9, height 5\' 6"  (1.676 m), weight 58.2 kg, SpO2 93 %.  Neurological Examination   Mental Status: Alert, oriented, thought content appropriate. Cranial Nerves: II: Discs flat bilaterally; Visual fields grossly normal, pupils equal, round, reactive to light and accommodation III,IV, VI: ptosis not present, extra-ocular motions intact bilaterally V,VII: smile symmetric, facial light touch sensation normal bilaterally VIII: hearing normal bilaterally IX,X: gag reflex present XI: bilateral shoulder shrug XII: midline tongue extension Motor: Right : Upper extremity   5/5    Left:     Upper extremity   5/5  Lower extremity   5/5     Lower extremity   5/5 Tone and bulk:normal tone throughout; no atrophy noted Sensory: Pinprick and light touch intact throughout, bilaterally Deep Tendon Reflexes: 2+ and symmetric throughout Plantars: Right: downgoing   Left: downgoing Cerebellar: normal finger-to-nose      Laboratory Studies:   Basic Metabolic Panel: Recent Labs  Lab 12/30/19 0500 12/30/19 0500 12/31/19 0605 12/31/19 0605 01/01/20 0507 01/02/20 0536 01/03/20 0442  NA 141  --  131*  --  132* 133* 133*  K 5.2*  --  5.4*  --  5.8* 3.8 4.7  CL 102  --  96*  --  98 94* 93*  CO2 24  --  18*  --  17* 26 26   GLUCOSE 66*  --  154*  --  124* 114* 110*  BUN 67*  --  80*  --  87* 42* 53*  CREATININE 6.71*  --  7.19*  --  8.23* 4.75* 5.66*  CALCIUM 8.4*   < > 8.6*   < > 8.2* 8.3* 8.5*  MG  --   --   --   --  1.9 1.6* 1.8  PHOS  --   --   --   --  8.3* 5.4* 8.5*   < > = values in this interval not displayed.    Liver Function Tests: Recent Labs  Lab 12/30/19 0500 12/31/19 0605  AST 12* 10*  ALT 18 13  ALKPHOS 98 85  BILITOT 0.8 0.6  PROT 6.4* 6.6  ALBUMIN 2.9* 3.0*   No results for input(s): LIPASE, AMYLASE in the last 168 hours. No results for input(s): AMMONIA in the last 168 hours.  CBC: Recent Labs  Lab 12/29/19 2121 12/29/19 2121 12/30/19 0500 12/30/19 0500 12/31/19 0605 01/01/20 0507 01/01/20 0950 01/02/20 0536 01/03/20 0442  WBC 5.5   < > 14.0*   < > 12.8* 10.4 10.8* 8.8 8.5  NEUTROABS 3.9  --  12.0*  --  8.6*  --   --   --   --  HGB 10.8*   < > 10.0*   < > 10.4* 10.0* 10.0* 10.3* 11.1*  HCT 36.3*   < > 32.4*   < > 33.3* 33.2* 32.9* 32.5* 35.6*  MCV 89.9   < > 88.3   < > 86.0 87.6 88.2 85.3 86.2  PLT 210   < > 211   < > 207 182 192 153 157   < > = values in this interval not displayed.    Cardiac Enzymes: No results for input(s): CKTOTAL, CKMB, CKMBINDEX, TROPONINI in the last 168 hours.  BNP: Invalid input(s): POCBNP  CBG: Recent Labs  Lab 01/04/20 0456 01/04/20 0727 01/04/20 0735 01/04/20 0755 01/04/20 1132  GLUCAP 102* 68* 136* 101* 138*    Microbiology: Results for orders placed or performed during the hospital encounter of 12/29/19  SARS Coronavirus 2 by RT PCR (hospital order, performed in [facility] hospital lab) Nasopharyngeal Nasopharyngeal Swab     Status: None   Collection Time: 12/29/19 10:59 PM   Specimen: Nasopharyngeal Swab  Result Value Ref Range Status   SARS Coronavirus 2 NEGATIVE NEGATIVE Final    Comment: (NOTE) SARS-CoV-2 target nucleic acids are NOT DETECTED.  The SARS-CoV-2 RNA is generally detectable in upper and  lower respiratory specimens during the acute phase of infection. The lowest concentration of SARS-CoV-2 viral copies this assay can detect is 250 copies / mL. A negative result does not preclude SARS-CoV-2 infection and should not be used as the sole basis for treatment or other patient management decisions.  A negative result may occur with improper specimen collection / handling, submission of specimen other than nasopharyngeal swab, presence of viral mutation(s) within the areas targeted by this assay, and inadequate number of viral copies (<250 copies / mL). A negative result must be combined with clinical observations, patient history, and epidemiological information.  Fact Sheet for Patients:   StrictlyIdeas.no  Fact Sheet for Healthcare Providers: BankingDealers.co.za  This test is not yet approved or  cleared by the [place] FDA and has been authorized for detection and/or diagnosis of SARS-CoV-2 by FDA under an Emergency Use Authorization (EUA).  This EUA will remain in effect (meaning this test can be used) for the duration of the COVID-19 declaration under Section 564(b)(1) of the Act, 21 U.S.C. section 360bbb-3(b)(1), unless the authorization is terminated or revoked sooner.  Performed at [facility], [address] [place], [place] [address]   Blood culture (routine x 2)     Status: None   Collection Time: 12/29/19 10:59 PM   Specimen: BLOOD  Result Value Ref Range Status   Specimen Description BLOOD RIGHT ANTECUBITAL  Final   Special Requests   Final    BOTTLES DRAWN AEROBIC AND ANAEROBIC Blood Culture adequate volume   Culture   Final    NO GROWTH 5 DAYS Performed at [facility], [address] [place], [place] [address]    Report Status 01/03/2020 FINAL  Final  MRSA PCR Screening     Status: None   Collection Time: 12/30/19 12:25 AM   Specimen: Nasopharyngeal  Result Value Ref Range  Status   MRSA by PCR NEGATIVE NEGATIVE Final    Comment:        The GeneXpert MRSA Assay (FDA approved for NASAL specimens only), is one component of a comprehensive MRSA colonization surveillance program. It is not intended to diagnose MRSA infection nor to guide or monitor treatment for MRSA infections. Performed at [facility], [address],  [place], [place] [address]   C Difficile Quick Screen w PCR reflex     Status: Abnormal   Collection Time: 12/30/19  1:50 PM   Specimen: STOOL  Result Value Ref Range Status   C Diff antigen POSITIVE (A) NEGATIVE Final   C Diff toxin NEGATIVE NEGATIVE Final   C Diff interpretation Results are indeterminate. See PCR results.  Final    Comment: Performed at [facility], [address] [place], [place] [address]  C. Diff by PCR, Reflexed     Status: Abnormal   Collection Time: 12/30/19  1:50 PM  Result Value Ref Range Status   Toxigenic C. Difficile by PCR POSITIVE (A) NEGATIVE Final    Comment: Positive for toxigenic C. difficile with little to no toxin production. Only treat if clinical presentation suggests symptomatic illness. Performed at [facility], [address] [place], [place] [address]     Coagulation Studies: No results for input(s): LABPROT, INR in the last 72 hours.  Urinalysis: No results for input(s): COLORURINE, LABSPEC, PHURINE, GLUCOSEU, HGBUR, BILIRUBINUR, KETONESUR, PROTEINUR, UROBILINOGEN, NITRITE, LEUKOCYTESUR in the last 168 hours.  Invalid input(s): APPERANCEUR  Lipid Panel:     Component Value Date/Time   CHOL 243 (A) 02/07/2008 0000   TRIG 188 (H) 10/28/2018 1342   HDL 44 02/07/2008 0000   LDLCALC 147 02/07/2008 0000    HgbA1C:  Lab Results  Component Value Date   HGBA1C 7.4 (H) 12/30/2019    Urine Drug Screen:      Component Value Date/Time   LABOPIA POSITIVE (A) 10/18/2018 0910   COCAINSCRNUR NONE DETECTED 10/18/2018 0910   LABBENZ NONE DETECTED 10/18/2018  0910   AMPHETMU NONE DETECTED 10/18/2018 0910   THCU POSITIVE (A) 10/18/2018 0910   LABBARB NONE DETECTED 10/18/2018 0910    Alcohol Level: No results for input(s): ETH in the last 168 hours.    Imaging: DG Chest Port 1 View  Result Date: 01/04/2020 CLINICAL DATA:  Shortness of breath. EXAM: PORTABLE CHEST 1 VIEW COMPARISON:  CT 12/29/2019.  Chest x-ray 12/29/2019. FINDINGS: Dual-lumen central catheter stable position. Cardiomegaly. Diffuse prominent bilateral pulmonary infiltrates/edema again noted. Cavitary lesion right upper lung again noted. Progression of left-sided pleural effusion. Stable right-sided pleural thickening. No pneumothorax. IMPRESSION: 1.  Dual-lumen catheter stable position. 2.  Stable cardiomegaly. 3. Diffuse prominent bilateral pulmonary infiltrates/edema again noted. Cavitary lesion right upper lung again noted. Progression of left-sided pleural effusion. Electronically Signed   By: Marcello Moores  Register   On: 01/04/2020 07:45     Assessment/Plan: 34 y.o. male with PMH of ESRD on HD admitted 12/29/19 due to Acute Hypoxic Respiratory Failure in the setting of Pulmonary edema/Volume Overload and questionable Necrotizing Pneumonia requiring BiPAP.  Nephrology was consulted for urgent HD. Overnight 7/2 had breakthrough seizures and transferred to stepdown. At baseline pt is on Keppra and Vimpat. Currently back to baseline.    - Currently back to baseline.  - Restart home AED - no need for EEG at this time - can be transferred out of ICU - no need for any further imaging form Neurological stand point.   Leotis Pain  01/04/2020, 12:11 PM

## 2020-01-04 NOTE — Progress Notes (Signed)
Clinic call today, patient chair time is changing to TTS 11:40. Will make patient aware.

## 2020-01-04 NOTE — Progress Notes (Addendum)
Name: Shawn [name] MRN: [id number] DOB: 07/20/1985    ADMISSION DATE:  12/29/2019 CONSULTATION DATE:  12/29/2019  REFERRING MD :  Dr. Jimmye Norman  CHIEF COMPLAINT:  Acute Respiratory Distress  BRIEF PATIENT DESCRIPTION:  34 y.o. male with PMH of ESRD on HD admitted 12/29/19 due to Acute Hypoxic Respiratory Failure in the setting of Pulmonary edema/Volume Overload and questionable Necrotizing Pneumonia requiring BiPAP.  Nephrology was consulted for urgent HD. Overnight 7/2 had breakthrough seizures and transferred to stepdown  SIGNIFICANT EVENTS  6/26: Admission to [facility] 6/28: Transferred to medsurg unit 7/2: Rapid response called for seizure activity and patient transferred back to stepdown unit  STUDIES:  6/26: CXR>>1. Persistent bilateral airspace opacities which may represent multifocal pneumonia or sequela of prior infectious process. 2. Apparent new cavitary lesion in the right mid lung zone. Follow-up with a contrast-enhanced CT of the chest is recommended. 3. Significant cardiomegaly with increased in size since prior study. An underlying pericardial effusion should be considered. 4. Well-positioned tunneled dialysis catheter. 5. Small bilateral pleural effusions. 6/26: CT chest w/ Contrast>>1. Extensive areas of consolidated lung, some of which is likely related to some passive atelectatic change. Areas of hypoattenuating lung parenchyma within these regions of consolidation further supported diagnosis of pneumonia. 2. There is an area of cavitation right upper lobe associated with a larger consolidative opacity, could reflect a necrotic pneumonia. 3. Moderate right and moderate to large left pleural effusion. Possibly parapneumonic and/or related to patient's volume status however no convincing CT evidence of empyema at this time. 4. Stable numerous scattered mediastinal, hilar and bilateral axillary lymph nodes, some of which appear low-attenuation,  likely combination edematous and reactive. 5. Interval development of additional paraseptal emphysematous/bullous changes in the bilateral upper lobes since prior CT. 6. Left IJ approach dual lumen dialysis catheter tip terminates at the of the right atrium. A small amount of gas along the catheter tract likely compatible with the recent placement of this access. 7. Cardiomegaly right heart enlargement, pulmonary artery enlargement and reflux of contrast into the IVC. Findings could suggest pulmonary hypertension and elevated right heart pressure/right heart failure. 8. Additional features of anasarca/volume overload with diffuse body wall and mediastinal edematous changes. Periportal edema could suggest some hepatic congestion in the setting of right heart failure. 9. Mild gallbladder wall thickening, nonspecific possibly related to motion or the hepatic congestion. Correlate with right upper quadrant symptoms and consider right upper quadrant ultrasound if clinically warranted 6/27: 2D Echocardiogram>> 7/2: Diffuse prominent bilateral pulmonary infiltrates/edema again noted. Cavitary lesion right upper lung again noted. Progression of left-sided pleural effusion.  CULTURES: SARS-CoV-2 PCR 6/26>> Negative MRSA PCR 6/27> Negative C-Diff 6/27> Positive Blood culture x2 6/26>>No growth x 5 days Strep pneumo urinary antigen 6/26>> Legionella urinary antigen 6/26>> Negative >  ANTIBIOTICS: Vancomycin 6/26>> stopped Zosyn 6/26>>  HISTORY OF PRESENT ILLNESS:   Shawn [name] is a 34 year old male with a past medical history significant for ESRD on hemodialysis, hypertension, GERD, diabetes mellitus, IBS, osteomyelitis who presents to [facility] ED on 12/29/2019 due to acute respiratory distress and altered mental status.  Apparently patient's family reported that he had an episode of unresponsiveness with questionable convulsions.  Upon EMS arrival he was noted to be hypoxic with O2  saturations in the 70s of which he was placed on nonrebreather mask.  The patient did miss his scheduled hemodialysis session earlier today due to an episode of diarrhea.  He also had a dialysis catheter placed yesterday to his left chest.  Upon  presentation to the ED he remained short of breath and hypoxic.  He was afebrile, respiratory rate in the 20s, normal sinus rhythm, blood pressure 116/83, and 95% O2 saturations on nonrebreather.  Initial work-up revealed BUN 66, creatinine 6.07, BNP 3473, high-sensitivity troponin 20, WBC 5.5, hemoglobin 10.8.  CT Chest revealed extensive areas of consolidation with right upper lobe cavitation concerning for questionable necrotizing pneumonia.  Also noted to have cardiomegaly and moderate right and left pleural effusions.  His COVID-19 PCR is negative.  Given his work of breathing and hypoxia he was subsequently placed on BiPAP.  PCCM is asked to admit the patient to stepdown unit for further work-up and treatment of acute hypoxic respiratory failure secondary to pulmonary edema/volume overload and questionable necrotizing pneumonia.  Nephrology has been consulted for urgent hemodialysis for volume removal.  PAST MEDICAL HISTORY :   has a past medical history of CKD (chronic kidney disease), Diabetes mellitus without complication ([place]), GERD (gastroesophageal reflux disease), HTN (hypertension), IBS (irritable bowel syndrome), and Osteomyelitis ([place]).  has a past surgical history that includes Irrigation and debridement foot (Right, 12/20/2017); Irrigation and debridement foot (Right, 12/24/2017); Amputation (Right, 12/24/2017); Application if wound vac (Right, 12/24/2017); ABDOMINAL AORTOGRAM W/LOWER EXTREMITY (Right, 12/23/2017); Irrigation and debridement foot (Right, 01/01/2018); Application if wound vac (Right, 01/01/2018); Irrigation and debridement foot (Left, 04/06/2018); Amputation (Left, 04/06/2018); Lower Extremity Angiography (Left, 04/06/2018); Amputation (Left,  04/09/2018); Achilles tendon surgery (Bilateral, 06/16/2018); Bone excision (Bilateral, 06/16/2018); Irrigation and debridement foot (Right, 09/20/2018); Lower Extremity Angiography (Bilateral, 10/13/2018); and Amputation (Right, 10/18/2018).   Prior to Admission medications   Medication Sig Start Date End Date Taking? Authorizing Provider  ALPRAZolam (XANAX) 0.25 MG tablet Take 1 tablet (0.25 mg total) by mouth 3 (three) times daily as needed for anxiety. 08/18/18   Gladstone Lighter, MD  amoxicillin-clavulanate (AUGMENTIN) 875-125 MG tablet Take 1 tablet by mouth 2 (two) times daily. Patient not taking: Reported on 10/11/2018 09/21/18   Bettey Costa, MD  calcium carbonate (CALCIUM 600) 600 MG TABS tablet Take 1 tablet (600 mg total) by mouth 2 (two) times daily with a meal for 30 days. 09/13/18 10/13/18  Milinda Pointer, MD  diphenoxylate-atropine (LOMOTIL) 2.5-0.025 MG tablet Take 1 tablet by mouth 4 (four) times daily as needed for diarrhea or loose stools.    [provider]  ferrous sulfate 325 (65 FE) MG tablet Take 1 tablet (325 mg total) by mouth 2 (two) times daily with a meal. 01/03/18   Sainani, Belia Heman, MD  gabapentin (NEURONTIN) 300 MG capsule Take 1 capsule (300 mg total) by mouth 2 (two) times daily. 09/21/18   Bettey Costa, MD  insulin aspart (NOVOLOG) 100 UNIT/ML injection Inject 4 Units into the skin 3 (three) times daily with meals. 08/18/18   Gladstone Lighter, MD  insulin glargine (LANTUS) 100 UNIT/ML injection Inject 0.3 mLs (30 Units total) into the skin daily. Patient taking differently: Inject 35 Units into the skin 2 (two) times daily.  08/18/18   Gladstone Lighter, MD  Insulin Syringes, Disposable, U-100 0.3 ML MISC 1 Syringe by Does not apply route 4 (four) times daily -  with meals and at bedtime. 12/27/17   Loletha Grayer, MD  levofloxacin (LEVAQUIN) 750 MG tablet Take 1 tablet (750 mg total) by mouth daily. Patient not taking: Reported on 10/11/2018 09/24/18   Carrie Mew, MD  promethazine (PHENERGAN) 25 MG tablet Take 1 tablet (25 mg total) by mouth every 6 (six) hours as needed for nausea or vomiting. 08/18/18  Gladstone Lighter, MD  vitamin C (VITAMIN C) 250 MG tablet Take 1 tablet (250 mg total) by mouth 2 (two) times daily. 06/20/18   Gladstone Lighter, MD  zolpidem (AMBIEN) 10 MG tablet Take 10 mg by mouth at bedtime.     [provider]   Scheduled Meds: . ALPRAZolam  0.25 mg Oral BID  . ascorbic acid  250 mg Oral BID  . calcium-vitamin D  1 tablet Oral BID WC  . Chlorhexidine Gluconate Cloth  6 each Topical Daily  . collagenase   Topical Daily  . ferrous sulfate  325 mg Oral BID WC  . gabapentin  300 mg Oral BID  . insulin aspart  0-6 Units Subcutaneous Q4H  . insulin glargine  5 Units Subcutaneous Daily  . lacosamide  100 mg Oral BID  . levETIRAcetam  500 mg Oral BID  . metoCLOPramide (REGLAN) injection  5 mg Intravenous Q8H   Continuous Infusions: . sodium chloride    . sodium chloride    . piperacillin-tazobactam (ZOSYN)  IV Stopped (01/04/20 0539)   PRN Meds:.sodium chloride, sodium chloride, acetaminophen, alteplase, docusate sodium, heparin, ipratropium-albuterol, lidocaine (PF), lidocaine-prilocaine, loperamide, ondansetron (ZOFRAN) IV, oxyCODONE, pentafluoroprop-tetrafluoroeth, polyethylene glycol   Allergies  Allergen Reactions  . Banana Hives, Nausea And Vomiting and Rash  . Keflex [Cephalexin] Rash    No swelling- also taken penicillin without any issue.  . Sulfa Antibiotics Anaphylaxis  . Grapeseed Extract [Nutritional Supplements] Itching  . Shellfish Allergy Hives    "ALL SEAFOOD"  . Grape Seed Rash    FAMILY HISTORY:  family history includes Diabetes in his mother; Healthy in his father; Ovarian cancer in his mother. SOCIAL HISTORY:  reports that he has never smoked. He has never used smokeless tobacco. He reports current drug use. Drugs: Marijuana and PCP. He reports that he does not drink  alcohol.  REVIEW OF SYSTEMS: Limited ROS was performed and no other pertinent findings was found   SUBJECTIVE:  Pt now off BiPAP and on oxygen via nasal cannula. Denies chest pain, cough, sputum production, wheezing, Abdominal pain, N/V/D, fever, chills, edema, dysuria. Reports anxiety and pain want to restart his home Alprazolam and oxycodone.  VITAL SIGNS: Temp:  [97.4 F (36.3 C)-99.2 F (37.3 C)] 98.6 F (37 C) (07/02 0800) Pulse Rate:  [79-120] 94 (07/02 0900) Resp:  [9-16] 9 (07/02 0900) BP: (108-168)/(71-94) 144/83 (07/02 0900) SpO2:  [93 %-100 %] 93 % (07/02 0900) Weight:  [58.2 kg] 58.2 kg (07/02 0800)  PHYSICAL EXAMINATION: General: Acutely ill-appearing male, sitting in bed, on BiPAP, with moderate respiratory distress Neuro: Awake, alert and oriented x4, follows commands, no focal deficits, speech clear HEENT: Atraumatic, normocephalic, neck supple, no JVD Cardiovascular: Regular rate and rhythm, good peripheral circulation Lungs: Mild crackles and coarseness to ausculation bilaterally Abdomen: Soft, nontender, nondistended, no guarding or rebound tenderness Musculoskeletal: Right BKA, no edema Skin: Warm and mildly diaphoretic.  Stage III sacral decubitus and right hip skin tear.  Recent Labs  Lab 01/01/20 0507 01/02/20 0536 01/03/20 0442  NA 132* 133* 133*  K 5.8* 3.8 4.7  CL 98 94* 93*  CO2 17* 26 26  BUN 87* 42* 53*  CREATININE 8.23* 4.75* 5.66*  GLUCOSE 124* 114* 110*   Recent Labs  Lab 01/01/20 0950 01/02/20 0536 01/03/20 0442  HGB 10.0* 10.3* 11.1*  HCT 32.9* 32.5* 35.6*  WBC 10.8* 8.8 8.5  PLT 192 153 157   DG Chest Port 1 View  Result Date: 01/04/2020 CLINICAL DATA:  Shortness of breath. EXAM: PORTABLE CHEST 1 VIEW COMPARISON:  CT 12/29/2019.  Chest x-ray 12/29/2019. FINDINGS: Dual-lumen central catheter stable position. Cardiomegaly. Diffuse prominent bilateral pulmonary infiltrates/edema again noted. Cavitary lesion right upper lung again  noted. Progression of left-sided pleural effusion. Stable right-sided pleural thickening. No pneumothorax. IMPRESSION: 1.  Dual-lumen catheter stable position. 2.  Stable cardiomegaly. 3. Diffuse prominent bilateral pulmonary infiltrates/edema again noted. Cavitary lesion right upper lung again noted. Progression of left-sided pleural effusion. Electronically Signed   By: Marcello Moores  Register   On: 01/04/2020 07:45    ASSESSMENT / PLAN:  Acute Hypoxic Respiratory Failure in the setting of Pulmonary Edema/Volume Overload and ? Necrotizing Pneumonia -Supplemental O2 as needed to maintain O2 saturations greater than 92% -Follow intermittent chest x-ray and ABG as needed -s/p Hemodialysis for volume removal -Antibiotics as above -As needed bronchodilators -2D Echocardiogram pending  ? Necrotizing Pneumonia -Monitor fever curve -Trend WBCs and procalcitonin -Follow cultures as above -Continue Zosyn  Seizure Activity - Likely breakthrough seizures due to non-compliant with seizure meds in addition to being on medication that could potentially lower seizure threshold (Levaquin + Zosyn) - Restart home medication Keppra + Lacosamide (Vimpat) - Seizure precautions - PRN Ativan for breakthrough seizure - Neurology input appreciated  ESRD on HD TTS -Monitor I&O's / urinary output -Follow BMP -Ensure adequate renal perfusion -Avoid nephrotoxic agents as able -Replace electrolytes as indicated -Nephrology following  Anemia of Chronic Disease -Monitor for S/Sx of bleeding -Trend CBC -Transfuse for Hgb <7  Diabetes Mellitus -CBG's -SSI -Follow ICU Hypo/hyperglycemia protocol  Persistent Nausea and vomiting HR:CBULAGTX Gastroparesis -prn zofran -avoid phenergan--makes him groggy -Reglan IV 5 mg tid  Unstageable coccyx wound Present on admission -Turn and reposition every two hours -Wound specialist following with dressing changes daily.    DISPOSITION: Stepdown GOALS OF CARE:  Full Code VTE PROPHYLAXIS: Heparin SubQ CONSULTS: Nephrology, Neurology, Wound UPDATES: Updated pt at bedside     Rufina Falco. DNP, CCRN, FNP-BC [facility]   01/04/2020, 11:40 AM

## 2020-01-05 ENCOUNTER — Encounter: Payer: Self-pay | Admitting: Internal Medicine

## 2020-01-05 LAB — RENAL FUNCTION PANEL
Albumin: 2.8 g/dL — ABNORMAL LOW (ref 3.5–5.0)
Anion gap: 12 (ref 5–15)
BUN: 38 mg/dL — ABNORMAL HIGH (ref 6–20)
CO2: 26 mmol/L (ref 22–32)
Calcium: 8.2 mg/dL — ABNORMAL LOW (ref 8.9–10.3)
Chloride: 91 mmol/L — ABNORMAL LOW (ref 98–111)
Creatinine, Ser: 4.51 mg/dL — ABNORMAL HIGH (ref 0.61–1.24)
GFR calc Af Amer: 19 mL/min — ABNORMAL LOW (ref >60)
GFR calc non Af Amer: 16 mL/min — ABNORMAL LOW (ref >60)
Glucose, Bld: 133 mg/dL — ABNORMAL HIGH (ref 70–99)
Phosphorus: 5.8 mg/dL — ABNORMAL HIGH (ref 2.5–4.6)
Potassium: 4.4 mmol/L (ref 3.5–5.1)
Sodium: 129 mmol/L — ABNORMAL LOW (ref 135–145)

## 2020-01-05 LAB — CBC
HCT: 31.7 % — ABNORMAL LOW (ref 39.0–52.0)
Hemoglobin: 9.3 g/dL — ABNORMAL LOW (ref 13.0–17.0)
MCH: 26.7 pg (ref 26.0–34.0)
MCHC: 29.3 g/dL — ABNORMAL LOW (ref 30.0–36.0)
MCV: 91.1 fL (ref 80.0–100.0)
Platelets: 146 10*3/uL — ABNORMAL LOW (ref 150–400)
RBC: 3.48 MIL/uL — ABNORMAL LOW (ref 4.22–5.81)
RDW: 16.9 % — ABNORMAL HIGH (ref 11.5–15.5)
WBC: 8.3 10*3/uL (ref 4.0–10.5)
nRBC: 0 % (ref 0.0–0.2)

## 2020-01-05 LAB — GLUCOSE, CAPILLARY
Glucose-Capillary: 112 mg/dL — ABNORMAL HIGH (ref 70–99)
Glucose-Capillary: 119 mg/dL — ABNORMAL HIGH (ref 70–99)
Glucose-Capillary: 130 mg/dL — ABNORMAL HIGH (ref 70–99)
Glucose-Capillary: 130 mg/dL — ABNORMAL HIGH (ref 70–99)
Glucose-Capillary: 136 mg/dL — ABNORMAL HIGH (ref 70–99)
Glucose-Capillary: 85 mg/dL (ref 70–99)

## 2020-01-05 LAB — HEPATITIS B SURFACE ANTIGEN: Hepatitis B Surface Ag: NONREACTIVE

## 2020-01-05 MED ORDER — IRBESARTAN 150 MG PO TABS
300.0000 mg | ORAL_TABLET | Freq: Every day | ORAL | Status: DC
  Administered 2020-01-05: 300 mg via ORAL
  Filled 2020-01-05: qty 2

## 2020-01-05 MED ORDER — FOLIC ACID 1 MG PO TABS
1.0000 mg | ORAL_TABLET | Freq: Every day | ORAL | Status: DC
  Administered 2020-01-05 – 2020-01-12 (×7): 1 mg via ORAL
  Filled 2020-01-05 (×7): qty 1

## 2020-01-05 MED ORDER — METOCLOPRAMIDE HCL 5 MG PO TABS
5.0000 mg | ORAL_TABLET | Freq: Three times a day (TID) | ORAL | Status: DC
  Administered 2020-01-05 – 2020-01-12 (×16): 5 mg via ORAL
  Filled 2020-01-05 (×22): qty 1

## 2020-01-05 MED ORDER — AMLODIPINE BESYLATE 10 MG PO TABS
10.0000 mg | ORAL_TABLET | Freq: Every day | ORAL | Status: DC
  Administered 2020-01-05 – 2020-01-11 (×6): 10 mg via ORAL
  Filled 2020-01-05 (×6): qty 1

## 2020-01-05 MED ORDER — PANTOPRAZOLE SODIUM 40 MG PO TBEC
40.0000 mg | DELAYED_RELEASE_TABLET | Freq: Every day | ORAL | Status: DC
  Administered 2020-01-05 – 2020-01-12 (×7): 40 mg via ORAL
  Filled 2020-01-05 (×7): qty 1

## 2020-01-05 MED ORDER — SEVELAMER CARBONATE 800 MG PO TABS
800.0000 mg | ORAL_TABLET | Freq: Three times a day (TID) | ORAL | Status: DC
  Administered 2020-01-05 – 2020-01-12 (×13): 800 mg via ORAL
  Filled 2020-01-05 (×13): qty 1

## 2020-01-05 MED ORDER — QUETIAPINE FUMARATE 25 MG PO TABS
25.0000 mg | ORAL_TABLET | Freq: Every day | ORAL | Status: DC
  Administered 2020-01-05 – 2020-01-12 (×8): 25 mg via ORAL
  Filled 2020-01-05 (×8): qty 1

## 2020-01-05 MED ORDER — HYDRALAZINE HCL 25 MG PO TABS
25.0000 mg | ORAL_TABLET | Freq: Three times a day (TID) | ORAL | Status: DC
  Administered 2020-01-05 – 2020-01-12 (×19): 25 mg via ORAL
  Filled 2020-01-05 (×19): qty 1

## 2020-01-05 MED ORDER — MIRTAZAPINE 15 MG PO TABS
30.0000 mg | ORAL_TABLET | Freq: Every day | ORAL | Status: DC
  Administered 2020-01-05 – 2020-01-12 (×8): 30 mg via ORAL
  Filled 2020-01-05 (×8): qty 2

## 2020-01-05 MED ORDER — CARVEDILOL 25 MG PO TABS
25.0000 mg | ORAL_TABLET | Freq: Two times a day (BID) | ORAL | Status: DC
  Administered 2020-01-05 – 2020-01-11 (×10): 25 mg via ORAL
  Filled 2020-01-05: qty 2
  Filled 2020-01-05: qty 1
  Filled 2020-01-05: qty 2
  Filled 2020-01-05 (×4): qty 1
  Filled 2020-01-05: qty 2
  Filled 2020-01-05: qty 1
  Filled 2020-01-05: qty 2

## 2020-01-05 NOTE — Progress Notes (Signed)
This note also relates to the following rows which could not be included: Pulse Rate - Cannot attach notes to unvalidated device data Resp - Cannot attach notes to unvalidated device data BP - Cannot attach notes to unvalidated device data  Hd completed  

## 2020-01-05 NOTE — Plan of Care (Signed)
Continuing with plan of care. 

## 2020-01-05 NOTE — Progress Notes (Signed)
This note also relates to the following rows which could not be included: Resp - Cannot attach notes to unvalidated device data  Hd started

## 2020-01-05 NOTE — Progress Notes (Signed)
[facility] [place] NAME: Gerson Fauth    MR#:  [id number]  DATE OF BIRTH:  [birth year]  SUBJECTIVE:  much alert awake. Ate good breakfast. No vomiting today. Remains on oxygen. Currently getting dialysis. REVIEW OF SYSTEMS:   Review of Systems  Constitutional: Negative for chills, fever and weight loss.  HENT: Negative for ear discharge, ear pain and nosebleeds.   Eyes: Negative for blurred vision, pain and discharge.  Respiratory: Negative for sputum production, shortness of breath, wheezing and stridor.   Cardiovascular: Negative for chest pain, palpitations, orthopnea and PND.  Gastrointestinal: Positive for nausea and vomiting. Negative for abdominal pain and diarrhea.  Genitourinary: Negative for frequency and urgency.  Musculoskeletal: Negative for back pain and joint pain.  Neurological: Positive for weakness. Negative for sensory change, speech change and focal weakness.  Psychiatric/Behavioral: Negative for depression and hallucinations. The patient is not nervous/anxious.    Tolerating Diet:yes Tolerating PT: bed bound/WC  DRUG ALLERGIES:   Allergies  Allergen Reactions  . Banana Hives, Nausea And Vomiting and Rash  . Keflex [Cephalexin] Rash    No swelling- also taken penicillin without any issue.  . Sulfa Antibiotics Anaphylaxis  . Grapeseed Extract [Nutritional Supplements] Itching  . Shellfish Allergy Hives    "ALL SEAFOOD"  . Grape Seed Rash    VITALS:  Blood pressure (!) 151/87, pulse 95, temperature 98.6 F (37 C), temperature source Axillary, resp. rate 11, height 5\' 6"  (1.676 m), weight 60.8 kg, SpO2 96 %.  PHYSICAL EXAMINATION:   Physical Exam  GENERAL:  34 y.o.-year-old patient lying in the bed with no acute distress. thin cachectic, appears chronically ill EYES: Pupils equal, round, reactive to light and accommodation. No scleral icterus.   HEENT: Head atraumatic, normocephalic. Oropharynx and  nasopharynx clear.  NECK:  Supple, no jugular venous distention. No thyroid enlargement, no tenderness.  LUNGS: distant and corase breath sounds bilaterally, no wheezing, rales, rhonchi. No use of accessory muscles of respiration.  CARDIOVASCULAR: S1, S2 normal. No murmurs, rubs, or gallops.  ABDOMEN: Soft, nontender, nondistended. Bowel sounds present. No organomegaly or mass.  EXTREMITIES: right BKA Left foot drop/atrophy LE NEUROLOGIC: Cranial nerves II through XII are intact. Grossly nonfocalPSYCHIATRIC:  patient is alert and oriented x 3.  SKIN: No obvious rash, lesion, or ulcer.   LABORATORY PANEL:  CBC Recent Labs  Lab 01/05/20 0926  WBC 8.3  HGB 9.3*  HCT 31.7*  PLT 146*    Chemistries  Recent Labs  Lab 12/31/19 0605 01/01/20 0507 01/03/20 0442 01/03/20 0442 01/05/20 0926  NA 131*   < > 133*   < > 129*  K 5.4*   < > 4.7   < > 4.4  CL 96*   < > 93*   < > 91*  CO2 18*   < > 26   < > 26  GLUCOSE 154*   < > 110*   < > 133*  BUN 80*   < > 53*   < > 38*  CREATININE 7.19*   < > 5.66*   < > 4.51*  CALCIUM 8.6*   < > 8.5*   < > 8.2*  MG  --    < > 1.8  --   --   AST 10*  --   --   --   --   ALT 13  --   --   --   --   ALKPHOS 85  --   --   --   --  BILITOT 0.6  --   --   --   --    < > = values in this interval not displayed.   Cardiac Enzymes No results for input(s): TROPONINI in the last 168 hours. RADIOLOGY:  DG Chest Port 1 View  Result Date: 01/04/2020 CLINICAL DATA:  Shortness of breath. EXAM: PORTABLE CHEST 1 VIEW COMPARISON:  CT 12/29/2019.  Chest x-ray 12/29/2019. FINDINGS: Dual-lumen central catheter stable position. Cardiomegaly. Diffuse prominent bilateral pulmonary infiltrates/edema again noted. Cavitary lesion right upper lung again noted. Progression of left-sided pleural effusion. Stable right-sided pleural thickening. No pneumothorax. IMPRESSION: 1.  Dual-lumen catheter stable position. 2.  Stable cardiomegaly. 3. Diffuse prominent bilateral pulmonary  infiltrates/edema again noted. Cavitary lesion right upper lung again noted. Progression of left-sided pleural effusion. Electronically Signed   By: Marcello Moores  Register   On: 01/04/2020 07:45   ECHOCARDIOGRAM LIMITED  Result Date: 01/04/2020    ECHOCARDIOGRAM LIMITED REPORT   Patient Name:   TELLIS SPIVAK Date of Exam: 01/04/2020 Medical Rec #:  [id number]        Height:       66.0 in Accession #:    [id number]       Weight:       128.3 lb Date of Birth:  [birth year]        BSA:          1.656 m Patient Age:    34 years         BP:           144/83 mmHg Patient Gender: M                HR:           89 bpm. Exam Location:  [facility] Procedure: 2D Echo, Cardiac Doppler and Limited Color Doppler Indications:     I31.3 Pericardial effusion  History:         Patient has prior history of Echocardiogram examinations, most                  recent 12/30/2019. CKD; Risk Factors:Hypertension and Diabetes.  Sonographer:     Charmayne Sheer RDCS (AE) Referring Phys:  [id number] CARMEN Veda Canning Diagnosing Phys: Yolonda Kida MD IMPRESSIONS  1. Left ventricular ejection fraction, by estimation, is 65 to 70%. The left ventricle has normal function. The left ventricle has no regional wall motion abnormalities. Left ventricular diastolic parameters are consistent with Grade I diastolic dysfunction (impaired relaxation).  2. Right ventricular systolic function is normal. The right ventricular size is normal. There is normal pulmonary artery systolic pressure.  3. Large pleural effusion in the left lateral region.  4. The mitral valve is normal in structure. No evidence of mitral valve regurgitation.  5. The aortic valve is grossly normal. FINDINGS  Left Ventricle: Left ventricular ejection fraction, by estimation, is 65 to 70%. The left ventricle has normal function. The left ventricle has no regional wall motion abnormalities. The left ventricular internal cavity size was normal in size. There is  no left ventricular hypertrophy. Right  Ventricle: The right ventricular size is normal. No increase in right ventricular wall thickness. Right ventricular systolic function is normal. There is normal pulmonary artery systolic pressure. Left Atrium: Left atrial size was normal in size. Right Atrium: Right atrial size was normal in size. Pericardium: Trivial pericardial effusion is present. Mitral Valve: The mitral valve is normal in structure. Tricuspid Valve: The tricuspid valve is normal in structure. Tricuspid valve regurgitation is  trivial. Aortic Valve: The aortic valve is grossly normal. Pulmonic Valve: The pulmonic valve was normal in structure. Pulmonic valve regurgitation is not visualized. Aorta: The aortic root is normal in size and structure. Additional Comments: There is a large pleural effusion in the left lateral region.  LEFT VENTRICLE PLAX 2D LVIDd:         5.44 cm LVIDs:         3.27 cm LV PW:         1.04 cm LV IVS:        0.75 cm  LEFT ATRIUM         Index LA diam:    3.90 cm 2.36 cm/m Dwayne Prince Rome MD Electronically signed by Yolonda Kida MD Signature Date/Time: 01/04/2020/2:27:12 PM    Final    ASSESSMENT AND PLAN:  PRINCE COUEY is a 34 y.o. malewith PMH of ESRD on HD, insulin-dependent DM, admitted 6/26/21due toAcute Hypoxic Respiratory Failure in the setting of Pulmonary edema/Volume Overload and questionable Necrotizing Pneumonia requiring BiPAP.  Transferred to floor on 6/28.  Acute Hypoxic Respiratory Failure in the setting of Pulmonary Edema/Volume overload and Necrotizing Pneumonia/infiltrates -7/2>>will move to Stepdown given frequent destaurations -Hemodialysis for volume removal -continue zosyn (IV vanc d/c'ed per pharm due to neg MRSA screen) -As needed bronchodilators -wean oxygen down as tolerated -d/w Dr Duwayne Heck 7/2>>CXR looks worsening infiltrate?aspiration?pulmonary edema  ESRD on HD TTS Hyperkalemia 2/2 ESRD -HD per Nephrology   Altered Mental Status/acute Encephalopathy/  intermittent with shaky spell and hypoxia XB:WIOMBTDH -patient resume back on his Keppra and vimpat -seen by neurology. No further workup needed.  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 Apply Santyl to open wound.  Cover with NS moist 2x2 and secure with silicone foam.  Change daily.  Turn and reposition every two hours.   Palliative care to see patient. Multiple comorbidities  transfer to the floor later today  DVT prophylaxis: SCD/Compression stockings Code Status: Full code  Family Communication:  Status is: inpatient Dispo:   The patient is from: home Anticipated d/c is to: home Anticipated d/c date is:few days Patient currently is not medically stable to d/c due to: still on 3L O2, on IV abx for PNA.  Need dialysis for fluid overload. Patient continues to have vomiting every day.  TOTAL critical TIME TAKING CARE OF THIS PATIENT: *35* minutes.  >50% time spent on counselling and coordination of care  Note: This dictation was prepared with Dragon dictation along with smaller phrase technology. Any transcriptional errors that result from this process are unintentional.  Fritzi Mandes M.D    [facility]   CC: Primary care physician; Valera Castle, MDPatient ID: Orest Dikes, male   DOB: 02-08-1986, 34 y.o.   MRN: [id number]

## 2020-01-05 NOTE — Progress Notes (Signed)
[facility]  ROUNDING NOTE   Subjective:   Patient seen during dialysis Tolerating well    HEMODIALYSIS FLOWSHEET:  Blood Flow Rate (mL/min): 350 mL/min Arterial Pressure (mmHg): -150 mmHg Venous Pressure (mmHg): 130 mmHg Transmembrane Pressure (mmHg): 30 mmHg Ultrafiltration Rate (mL/min): 60 mL/min Dialysate Flow Rate (mL/min): 600 ml/min Conductivity: Machine : 14 Conductivity: Machine : 14 Dialysis Fluid Bolus: Normal Saline Bolus Amount (mL): 250 mL  Able to eat without any problem although after eating breakfast he was slightly nauseous  Objective:  Vital signs in last 24 hours:  Temp:  [98.6 F (37 C)] 98.6 F (37 C) (07/03 0200) Pulse Rate:  [76-106] 95 (07/03 0600) Resp:  [7-21] 10 (07/03 0600) BP: (124-175)/(63-105) 140/80 (07/03 0600) SpO2:  [87 %-100 %] 97 % (07/03 0600) Weight:  [60.8 kg] 60.8 kg (07/03 0500)  Weight change:  Filed Weights   01/02/20 0500 01/04/20 0800 01/05/20 0500  Weight: 56.5 kg 58.2 kg 60.8 kg    Intake/Output: I/O last 3 completed shifts: In: 714.2 [P.O.:200; I.V.:134.1; IV Piggyback:380.1] Out: -    Intake/Output this shift:  No intake/output data recorded.  Physical Exam: General:  No acute distress, laying in the bed  HEENT  anicteric, moist oral mucous membrane  Pulm/lungs  normal breathing effort, lungs are clear to auscultation, [place] O2  CVS/Heart  no rub or gallop  Abdomen:   Soft, nontender  Extremities:  + peripheral edema  Neurologic:  Alert, oriented, able to follow commands  Skin:  No acute rashes     Basic Metabolic Panel: Recent Labs  Lab 12/30/19 0500 12/30/19 0500 12/31/19 0605 12/31/19 0605 01/01/20 0507 01/02/20 0536 01/03/20 0442  NA 141  --  131*  --  132* 133* 133*  K 5.2*  --  5.4*  --  5.8* 3.8 4.7  CL 102  --  96*  --  98 94* 93*  CO2 24  --  18*  --  17* 26 26  GLUCOSE 66*  --  154*  --  124* 114* 110*  BUN 67*  --  80*  --  87* 42* 53*  CREATININE 6.71*  --  7.19*  --   8.23* 4.75* 5.66*  CALCIUM 8.4*   < > 8.6*   < > 8.2* 8.3* 8.5*  MG  --   --   --   --  1.9 1.6* 1.8  PHOS  --   --   --   --  8.3* 5.4* 8.5*   < > = values in this interval not displayed.    Liver Function Tests: Recent Labs  Lab 12/30/19 0500 12/31/19 0605  AST 12* 10*  ALT 18 13  ALKPHOS 98 85  BILITOT 0.8 0.6  PROT 6.4* 6.6  ALBUMIN 2.9* 3.0*   No results for input(s): LIPASE, AMYLASE in the last 168 hours. No results for input(s): AMMONIA in the last 168 hours.  CBC: Recent Labs  Lab 12/29/19 2121 12/29/19 2121 12/30/19 0500 12/30/19 0500 12/31/19 0605 01/01/20 0507 01/01/20 0950 01/02/20 0536 01/03/20 0442  WBC 5.5   < > 14.0*   < > 12.8* 10.4 10.8* 8.8 8.5  NEUTROABS 3.9  --  12.0*  --  8.6*  --   --   --   --   HGB 10.8*   < > 10.0*   < > 10.4* 10.0* 10.0* 10.3* 11.1*  HCT 36.3*   < > 32.4*   < > 33.3* 33.2* 32.9* 32.5* 35.6*  MCV  89.9   < > 88.3   < > 86.0 87.6 88.2 85.3 86.2  PLT 210   < > 211   < > 207 182 192 153 157   < > = values in this interval not displayed.    Cardiac Enzymes: No results for input(s): CKTOTAL, CKMB, CKMBINDEX, TROPONINI in the last 168 hours.  BNP: Invalid input(s): POCBNP  CBG: Recent Labs  Lab 01/04/20 1518 01/04/20 1947 01/04/20 2316 01/05/20 0340 01/05/20 0747  GLUCAP 167* 133* 130* 119* 62    Microbiology: Results for orders placed or performed during the hospital encounter of 12/29/19  SARS Coronavirus 2 by RT PCR (hospital order, performed in [facility] hospital lab) Nasopharyngeal Nasopharyngeal Swab     Status: None   Collection Time: 12/29/19 10:59 PM   Specimen: Nasopharyngeal Swab  Result Value Ref Range Status   SARS Coronavirus 2 NEGATIVE NEGATIVE Final    Comment: (NOTE) SARS-CoV-2 target nucleic acids are NOT DETECTED.  The SARS-CoV-2 RNA is generally detectable in upper and lower respiratory specimens during the acute phase of infection. The lowest concentration of SARS-CoV-2 viral copies this  assay can detect is 250 copies / mL. A negative result does not preclude SARS-CoV-2 infection and should not be used as the sole basis for treatment or other patient management decisions.  A negative result may occur with improper specimen collection / handling, submission of specimen other than nasopharyngeal swab, presence of viral mutation(s) within the areas targeted by this assay, and inadequate number of viral copies (<250 copies / mL). A negative result must be combined with clinical observations, patient history, and epidemiological information.  Fact Sheet for Patients:   StrictlyIdeas.no  Fact Sheet for Healthcare Providers: BankingDealers.co.za  This test is not yet approved or  cleared by the [place] FDA and has been authorized for detection and/or diagnosis of SARS-CoV-2 by FDA under an Emergency Use Authorization (EUA).  This EUA will remain in effect (meaning this test can be used) for the duration of the COVID-19 declaration under Section 564(b)(1) of the Act, 21 U.S.C. section 360bbb-3(b)(1), unless the authorization is terminated or revoked sooner.  Performed at [facility], [address] [place], [place] [address]   Blood culture (routine x 2)     Status: None   Collection Time: 12/29/19 10:59 PM   Specimen: BLOOD  Result Value Ref Range Status   Specimen Description BLOOD RIGHT ANTECUBITAL  Final   Special Requests   Final    BOTTLES DRAWN AEROBIC AND ANAEROBIC Blood Culture adequate volume   Culture   Final    NO GROWTH 5 DAYS Performed at [facility], [address] [place], [place] [address]    Report Status 01/03/2020 FINAL  Final  MRSA PCR Screening     Status: None   Collection Time: 12/30/19 12:25 AM   Specimen: Nasopharyngeal  Result Value Ref Range Status   MRSA by PCR NEGATIVE NEGATIVE Final    Comment:        The GeneXpert MRSA Assay (FDA approved for NASAL  specimens only), is one component of a comprehensive MRSA colonization surveillance program. It is not intended to diagnose MRSA infection nor to guide or monitor treatment for MRSA infections. Performed at [facility], [place], [place] [address]   C Difficile Quick Screen w PCR reflex     Status: Abnormal   Collection Time: 12/30/19  1:50 PM   Specimen: STOOL  Result Value Ref Range  Status   C Diff antigen POSITIVE (A) NEGATIVE Final   C Diff toxin NEGATIVE NEGATIVE Final   C Diff interpretation Results are indeterminate. See PCR results.  Final    Comment: Performed at [facility], [address] [place], [place] [address]  C. Diff by PCR, Reflexed     Status: Abnormal   Collection Time: 12/30/19  1:50 PM  Result Value Ref Range Status   Toxigenic C. Difficile by PCR POSITIVE (A) NEGATIVE Final    Comment: Positive for toxigenic C. difficile with little to no toxin production. Only treat if clinical presentation suggests symptomatic illness. Performed at [facility], [address] [place], [place] [address]     Coagulation Studies: No results for input(s): LABPROT, INR in the last 72 hours.  Urinalysis: No results for input(s): COLORURINE, LABSPEC, PHURINE, GLUCOSEU, HGBUR, BILIRUBINUR, KETONESUR, PROTEINUR, UROBILINOGEN, NITRITE, LEUKOCYTESUR in the last 72 hours.  Invalid input(s): APPERANCEUR    Imaging: DG Chest Port 1 View  Result Date: 01/04/2020 CLINICAL DATA:  Shortness of breath. EXAM: PORTABLE CHEST 1 VIEW COMPARISON:  CT 12/29/2019.  Chest x-ray 12/29/2019. FINDINGS: Dual-lumen central catheter stable position. Cardiomegaly. Diffuse prominent bilateral pulmonary infiltrates/edema again noted. Cavitary lesion right upper lung again noted. Progression of left-sided pleural effusion. Stable right-sided pleural thickening. No pneumothorax. IMPRESSION: 1.  Dual-lumen catheter stable position. 2.  Stable cardiomegaly. 3.  Diffuse prominent bilateral pulmonary infiltrates/edema again noted. Cavitary lesion right upper lung again noted. Progression of left-sided pleural effusion. Electronically Signed   By: Marcello Moores  Register   On: 01/04/2020 07:45   ECHOCARDIOGRAM LIMITED  Result Date: 01/04/2020    ECHOCARDIOGRAM LIMITED REPORT   Patient Name:   AYAANSH SMAIL Date of Exam: 01/04/2020 Medical Rec #:  [id number]        Height:       66.0 in Accession #:    [id number]       Weight:       128.3 lb Date of Birth:  1985/11/15        BSA:          1.656 m Patient Age:    66 years         BP:           144/83 mmHg Patient Gender: M                HR:           89 bpm. Exam Location:  [facility] Procedure: 2D Echo, Cardiac Doppler and Limited Color Doppler Indications:     I31.3 Pericardial effusion  History:         Patient has prior history of Echocardiogram examinations, most                  recent 12/30/2019. CKD; Risk Factors:Hypertension and Diabetes.  Sonographer:     Charmayne Sheer RDCS (AE) Referring Phys:  [id number] CARMEN Veda Canning Diagnosing Phys: Yolonda Kida MD IMPRESSIONS  1. Left ventricular ejection fraction, by estimation, is 65 to 70%. The left ventricle has normal function. The left ventricle has no regional wall motion abnormalities. Left ventricular diastolic parameters are consistent with Grade I diastolic dysfunction (impaired relaxation).  2. Right ventricular systolic function is normal. The right ventricular size is normal. There is normal pulmonary artery systolic pressure.  3. Large pleural effusion in the left lateral region.  4. The mitral valve is normal in structure. No evidence of mitral valve regurgitation.  5. The aortic  valve is grossly normal. FINDINGS  Left Ventricle: Left ventricular ejection fraction, by estimation, is 65 to 70%. The left ventricle has normal function. The left ventricle has no regional wall motion abnormalities. The left ventricular internal cavity size was normal in size. There is  no left  ventricular hypertrophy. Right Ventricle: The right ventricular size is normal. No increase in right ventricular wall thickness. Right ventricular systolic function is normal. There is normal pulmonary artery systolic pressure. Left Atrium: Left atrial size was normal in size. Right Atrium: Right atrial size was normal in size. Pericardium: Trivial pericardial effusion is present. Mitral Valve: The mitral valve is normal in structure. Tricuspid Valve: The tricuspid valve is normal in structure. Tricuspid valve regurgitation is trivial. Aortic Valve: The aortic valve is grossly normal. Pulmonic Valve: The pulmonic valve was normal in structure. Pulmonic valve regurgitation is not visualized. Aorta: The aortic root is normal in size and structure. Additional Comments: There is a large pleural effusion in the left lateral region.  LEFT VENTRICLE PLAX 2D LVIDd:         5.44 cm LVIDs:         3.27 cm LV PW:         1.04 cm LV IVS:        0.75 cm  LEFT ATRIUM         Index LA diam:    3.90 cm 2.36 cm/m Yolonda Kida MD Electronically signed by Yolonda Kida MD Signature Date/Time: 01/04/2020/2:27:12 PM    Final      Medications:   . sodium chloride Stopped (01/05/20 0541)  . sodium chloride    . piperacillin-tazobactam (ZOSYN)  IV 100 mL/hr at 01/05/20 0600   . ALPRAZolam  0.25 mg Oral BID  . ascorbic acid  250 mg Oral BID  . calcium-vitamin D  1 tablet Oral BID WC  . Chlorhexidine Gluconate Cloth  6 each Topical Daily  . collagenase   Topical Daily  . ferrous sulfate  325 mg Oral BID WC  . gabapentin  300 mg Oral BID  . insulin aspart  0-6 Units Subcutaneous Q4H  . insulin glargine  5 Units Subcutaneous Daily  . lacosamide  100 mg Oral BID  . levETIRAcetam  500 mg Oral BID  . metoCLOPramide (REGLAN) injection  5 mg Intravenous Q8H   sodium chloride, sodium chloride, acetaminophen, alteplase, docusate sodium, heparin, hydrALAZINE, ipratropium-albuterol, lidocaine (PF), lidocaine-prilocaine,  loperamide, ondansetron (ZOFRAN) IV, oxyCODONE, pentafluoroprop-tetrafluoroeth, polyethylene glycol  Assessment/ Plan:  [name] is a 34 y.o. white male with end stage renal disease on hemodialysis, hypertension, diabetes mellitus, GERD, IBS, osteomyelitis who was admitted to [facility] on 12/29/2019 for End stage renal disease on dialysis ([place]) [N18.6, Z99.2] Acute respiratory failure with hypoxia (No Name) [J96.01]   [facility] nephrology  1. End Stage renal disease:  TTS schedule.   Routine HD today Volume removal as tolerated  2. Anemia of CKD Lab Results  Component Value Date   HGB 11.1 (L) 01/03/2020  Will monitor for Epogen    3.  Secondary Hyperparathyroidism:  - Calcium carbonate with meals. Lab Results  Component Value Date   CALCIUM 8.5 (L) 01/03/2020   PHOS 8.5 (H) 01/03/2020    4.  Seizure disorder - had breakthrough sz. Was seen by neurologist.  - restarted home regimen of Vimpat and Keppra.        LOS: 7 Tylena Prisk 7/3/20218:48 AM

## 2020-01-05 NOTE — Progress Notes (Signed)
Pharmacy Antibiotic Note  Shawn [name] is a 34 y.o. male admitted on 12/29/2019 with pneumonia.  Pharmacy has been consulted for Zosyn dosing.  Plan: Continue Zosyn 2.25 g IV q8h (HD dosing). Today is day 7 of antibiotics.    Height: 5\' 6"  (167.6 cm) Weight: 60.8 kg (134 lb 0.6 oz) IBW/kg (Calculated) : 63.8  Temp (24hrs), Avg:98.6 F (37 C), Min:98.6 F (37 C), Max:98.6 F (37 C)  Recent Labs  Lab 12/29/19 2259 12/30/19 0041 12/30/19 0500 12/31/19 6387 12/31/19 0605 01/01/20 0507 01/01/20 0950 01/02/20 0536 01/03/20 0442 01/05/20 0926  WBC  --   --    < > 12.8*   < > 10.4 10.8* 8.8 8.5 8.3  CREATININE  --   --    < > 7.19*  --  8.23*  --  4.75* 5.66* 4.51*  LATICACIDVEN 1.0 0.9  --   --   --   --   --   --   --   --    < > = values in this interval not displayed.    Estimated Creatinine Clearance: 20.2 mL/min (A) (by C-G formula based on SCr of 4.51 mg/dL (H)).    Allergies  Allergen Reactions  . Banana Hives, Nausea And Vomiting and Rash  . Keflex [Cephalexin] Rash    No swelling- also taken penicillin without any issue.  . Sulfa Antibiotics Anaphylaxis  . Grapeseed Extract [Nutritional Supplements] Itching  . Shellfish Allergy Hives    "ALL SEAFOOD"  . Grape Seed Rash      Thank you for allowing pharmacy to be a part of this patient's care.  Oswald Hillock, PharmD 01/05/2020 1:06 PM

## 2020-01-06 ENCOUNTER — Inpatient Hospital Stay: Payer: Medicaid Other

## 2020-01-06 ENCOUNTER — Encounter: Payer: Self-pay | Admitting: Internal Medicine

## 2020-01-06 DIAGNOSIS — J90 Pleural effusion, not elsewhere classified: Secondary | ICD-10-CM

## 2020-01-06 LAB — CBC WITH DIFFERENTIAL/PLATELET
Abs Immature Granulocytes: 0.02 10*3/uL (ref 0.00–0.07)
Basophils Absolute: 0 10*3/uL (ref 0.0–0.1)
Basophils Relative: 1 %
Eosinophils Absolute: 0.1 10*3/uL (ref 0.0–0.5)
Eosinophils Relative: 3 %
HCT: 28.5 % — ABNORMAL LOW (ref 39.0–52.0)
Hemoglobin: 8.3 g/dL — ABNORMAL LOW (ref 13.0–17.0)
Immature Granulocytes: 1 %
Lymphocytes Relative: 8 %
Lymphs Abs: 0.3 10*3/uL — ABNORMAL LOW (ref 0.7–4.0)
MCH: 26.6 pg (ref 26.0–34.0)
MCHC: 29.1 g/dL — ABNORMAL LOW (ref 30.0–36.0)
MCV: 91.3 fL (ref 80.0–100.0)
Monocytes Absolute: 0.7 10*3/uL (ref 0.1–1.0)
Monocytes Relative: 16 %
Neutro Abs: 3.1 10*3/uL (ref 1.7–7.7)
Neutrophils Relative %: 71 %
Platelets: 137 10*3/uL — ABNORMAL LOW (ref 150–400)
RBC: 3.12 MIL/uL — ABNORMAL LOW (ref 4.22–5.81)
RDW: 16.7 % — ABNORMAL HIGH (ref 11.5–15.5)
WBC: 4.3 10*3/uL (ref 4.0–10.5)
nRBC: 0 % (ref 0.0–0.2)

## 2020-01-06 LAB — PROTEIN, PLEURAL OR PERITONEAL FLUID: Total protein, fluid: 3.2 g/dL

## 2020-01-06 LAB — TRIGLYCERIDES: Triglycerides: 100 mg/dL

## 2020-01-06 LAB — COMPREHENSIVE METABOLIC PANEL
ALT: 10 U/L (ref 0–44)
AST: 13 U/L — ABNORMAL LOW (ref 15–41)
Albumin: 2.5 g/dL — ABNORMAL LOW (ref 3.5–5.0)
Alkaline Phosphatase: 77 U/L (ref 38–126)
Anion gap: 12 (ref 5–15)
BUN: 27 mg/dL — ABNORMAL HIGH (ref 6–20)
CO2: 26 mmol/L (ref 22–32)
Calcium: 8.4 mg/dL — ABNORMAL LOW (ref 8.9–10.3)
Chloride: 95 mmol/L — ABNORMAL LOW (ref 98–111)
Creatinine, Ser: 3.19 mg/dL — ABNORMAL HIGH (ref 0.61–1.24)
GFR calc Af Amer: 28 mL/min — ABNORMAL LOW (ref >60)
GFR calc non Af Amer: 24 mL/min — ABNORMAL LOW (ref >60)
Glucose, Bld: 145 mg/dL — ABNORMAL HIGH (ref 70–99)
Potassium: 4.1 mmol/L (ref 3.5–5.1)
Sodium: 133 mmol/L — ABNORMAL LOW (ref 135–145)
Total Bilirubin: 0.7 mg/dL (ref 0.3–1.2)
Total Protein: 6.2 g/dL — ABNORMAL LOW (ref 6.5–8.1)

## 2020-01-06 LAB — MAGNESIUM: Magnesium: 1.5 mg/dL — ABNORMAL LOW (ref 1.7–2.4)

## 2020-01-06 LAB — BODY FLUID CELL COUNT WITH DIFFERENTIAL
Eos, Fluid: 0 %
Lymphs, Fluid: 81 %
Monocyte-Macrophage-Serous Fluid: 18 %
Neutrophil Count, Fluid: 1 %
Total Nucleated Cell Count, Fluid: 4077 cu mm

## 2020-01-06 LAB — LACTATE DEHYDROGENASE, PLEURAL OR PERITONEAL FLUID: LD, Fluid: 103 U/L — ABNORMAL HIGH (ref 3–23)

## 2020-01-06 LAB — BLOOD GAS, ARTERIAL
Acid-Base Excess: 0.8 mmol/L (ref 0.0–2.0)
Bicarbonate: 26 mmol/L (ref 20.0–28.0)
FIO2: 0.3
MECHVT: 500 mL
Mechanical Rate: 20
O2 Saturation: 92.5 %
PEEP: 5 cmH2O
Patient temperature: 37
pCO2 arterial: 43 mmHg (ref 32.0–48.0)
pH, Arterial: 7.39 (ref 7.350–7.450)
pO2, Arterial: 66 mmHg — ABNORMAL LOW (ref 83.0–108.0)

## 2020-01-06 LAB — GLUCOSE, PLEURAL OR PERITONEAL FLUID: Glucose, Fluid: 82 mg/dL

## 2020-01-06 LAB — LACTIC ACID, PLASMA: Lactic Acid, Venous: 0.6 mmol/L (ref 0.5–1.9)

## 2020-01-06 LAB — ALBUMIN, PLEURAL OR PERITONEAL FLUID: Albumin, Fluid: 1.6 g/dL

## 2020-01-06 LAB — T4, FREE: Free T4: 0.97 ng/dL (ref 0.61–1.12)

## 2020-01-06 LAB — GLUCOSE, CAPILLARY
Glucose-Capillary: 112 mg/dL — ABNORMAL HIGH (ref 70–99)
Glucose-Capillary: 113 mg/dL — ABNORMAL HIGH (ref 70–99)
Glucose-Capillary: 133 mg/dL — ABNORMAL HIGH (ref 70–99)
Glucose-Capillary: 145 mg/dL — ABNORMAL HIGH (ref 70–99)
Glucose-Capillary: 56 mg/dL — ABNORMAL LOW (ref 70–99)
Glucose-Capillary: 62 mg/dL — ABNORMAL LOW (ref 70–99)
Glucose-Capillary: 67 mg/dL — ABNORMAL LOW (ref 70–99)
Glucose-Capillary: 70 mg/dL (ref 70–99)
Glucose-Capillary: 84 mg/dL (ref 70–99)
Glucose-Capillary: 95 mg/dL (ref 70–99)

## 2020-01-06 LAB — PROCALCITONIN: Procalcitonin: 2.18 ng/mL

## 2020-01-06 LAB — TROPONIN I (HIGH SENSITIVITY)
Troponin I (High Sensitivity): 28 ng/L — ABNORMAL HIGH
Troponin I (High Sensitivity): 55 ng/L — ABNORMAL HIGH (ref <18)

## 2020-01-06 LAB — TSH: TSH: 2.126 u[IU]/mL (ref 0.350–4.500)

## 2020-01-06 LAB — CORTISOL: Cortisol, Plasma: 5 ug/dL

## 2020-01-06 MED ORDER — FAMOTIDINE IN NACL 20-0.9 MG/50ML-% IV SOLN: 20.0000 mg | Freq: Two times a day (BID) | INTRAVENOUS | Status: DC

## 2020-01-06 MED ORDER — SODIUM CHLORIDE 0.9 % IV SOLN
100.0000 mg | Freq: Two times a day (BID) | INTRAVENOUS | Status: DC
  Administered 2020-01-06 – 2020-01-09 (×7): 100 mg via INTRAVENOUS
  Filled 2020-01-06 (×10): qty 10

## 2020-01-06 MED ORDER — DEXTROSE 5 % IV SOLN: INTRAVENOUS | Status: DC

## 2020-01-06 MED ORDER — IOHEXOL 350 MG/ML SOLN
75.0000 mL | Freq: Once | INTRAVENOUS | Status: AC | PRN
  Administered 2020-01-06: 75 mL via INTRAVENOUS

## 2020-01-06 MED ORDER — DEXTROSE 50 % IV SOLN
12.5000 g | INTRAVENOUS | Status: AC
  Administered 2020-01-06: 12.5 g via INTRAVENOUS
  Filled 2020-01-06: qty 50

## 2020-01-06 MED ORDER — PROPOFOL 1000 MG/100ML IV EMUL
INTRAVENOUS | Status: AC
  Administered 2020-01-06: 30 ug/kg/min via INTRAVENOUS
  Filled 2020-01-06: qty 100

## 2020-01-06 MED ORDER — DOCUSATE SODIUM 50 MG/5ML PO LIQD
100.0000 mg | Freq: Two times a day (BID) | ORAL | Status: DC
  Filled 2020-01-06: qty 10

## 2020-01-06 MED ORDER — CHLORHEXIDINE GLUCONATE 0.12% ORAL RINSE (MEDLINE KIT)
15.0000 mL | Freq: Two times a day (BID) | OROMUCOSAL | Status: DC
  Administered 2020-01-06 – 2020-01-07 (×2): 15 mL via OROMUCOSAL

## 2020-01-06 MED ORDER — FENTANYL CITRATE (PF) 100 MCG/2ML IJ SOLN: 50.0000 ug | INTRAMUSCULAR | Status: DC | PRN

## 2020-01-06 MED ORDER — EPOETIN ALFA 10000 UNIT/ML IJ SOLN
4000.0000 [IU] | INTRAMUSCULAR | Status: DC
  Administered 2020-01-08: 4000 [IU] via INTRAVENOUS

## 2020-01-06 MED ORDER — COSYNTROPIN 0.25 MG IJ SOLR
0.2500 mg | Freq: Once | INTRAMUSCULAR | Status: AC
  Administered 2020-01-06: 0.25 mg via INTRAVENOUS
  Filled 2020-01-06: qty 0.25

## 2020-01-06 MED ORDER — POLYETHYLENE GLYCOL 3350 17 G PO PACK: 17.0000 g | PACK | Freq: Every day | ORAL | Status: DC

## 2020-01-06 MED ORDER — FENTANYL CITRATE (PF) 100 MCG/2ML IJ SOLN
50.0000 ug | INTRAMUSCULAR | Status: DC | PRN
  Administered 2020-01-06 (×2): 100 ug via INTRAVENOUS
  Filled 2020-01-06 (×2): qty 2

## 2020-01-06 MED ORDER — IRBESARTAN 150 MG PO TABS
300.0000 mg | ORAL_TABLET | Freq: Every day | ORAL | Status: DC
  Administered 2020-01-07 – 2020-01-11 (×5): 300 mg via ORAL
  Filled 2020-01-06 (×5): qty 2

## 2020-01-06 MED ORDER — FAMOTIDINE IN NACL 20-0.9 MG/50ML-% IV SOLN
20.0000 mg | INTRAVENOUS | Status: DC
  Administered 2020-01-07 – 2020-01-09 (×3): 20 mg via INTRAVENOUS
  Filled 2020-01-06 (×3): qty 50

## 2020-01-06 MED ORDER — PROPOFOL 1000 MG/100ML IV EMUL
5.0000 ug/kg/min | INTRAVENOUS | Status: DC
  Administered 2020-01-06 (×2): 30 ug/kg/min via INTRAVENOUS
  Administered 2020-01-06: 40 ug/kg/min via INTRAVENOUS
  Administered 2020-01-07 (×2): 60 ug/kg/min via INTRAVENOUS
  Administered 2020-01-07: 30 ug/kg/min via INTRAVENOUS
  Filled 2020-01-06 (×6): qty 100

## 2020-01-06 MED ORDER — GLUCAGON HCL RDNA (DIAGNOSTIC) 1 MG IJ SOLR
1.0000 mg | Freq: Once | INTRAMUSCULAR | Status: AC | PRN
  Administered 2020-01-06: 1 mg via INTRAVENOUS
  Filled 2020-01-06: qty 1

## 2020-01-06 MED ORDER — ORAL CARE MOUTH RINSE
15.0000 mL | OROMUCOSAL | Status: DC
  Administered 2020-01-06 – 2020-01-07 (×9): 15 mL via OROMUCOSAL

## 2020-01-06 NOTE — Progress Notes (Signed)
Pharmacy Antibiotic Note  Shawn [name] is a 34 y.o. male admitted on 12/29/2019 with pneumonia.  Pharmacy has been consulted for Zosyn dosing.  Plan: Continue Zosyn 2.25 g IV q8h (HD dosing). Today is day 8 of antibiotics.    Height: 5' 5.98" (167.6 cm) Weight: 61.4 kg (135 lb 5.8 oz) IBW/kg (Calculated) : 63.76  Temp (24hrs), Avg:98.2 F (36.8 C), Min:96.9 F (36.1 C), Max:99.8 F (37.7 C)  Recent Labs  Lab 01/01/20 0507 01/01/20 0507 01/01/20 0950 01/02/20 0536 01/03/20 0442 01/05/20 0926 01/06/20 0003  WBC 10.4   < > 10.8* 8.8 8.5 8.3 4.3  CREATININE 8.23*  --   --  4.75* 5.66* 4.51* 3.19*  LATICACIDVEN  --   --   --   --   --   --  0.6   < > = values in this interval not displayed.    Estimated Creatinine Clearance: 28.9 mL/min (A) (by C-G formula based on SCr of 3.19 mg/dL (H)).    Allergies  Allergen Reactions  . Banana Hives, Nausea And Vomiting and Rash  . Keflex [Cephalexin] Rash    No swelling- also taken penicillin without any issue.  . Sulfa Antibiotics Anaphylaxis  . Grapeseed Extract [Nutritional Supplements] Itching  . Shellfish Allergy Hives    "ALL SEAFOOD"  . Grape Seed Rash     Thank you for allowing pharmacy to be a part of this patient's care.  Pernell Dupre, PharmD, BCPS Clinical Pharmacist 01/06/2020 10:44 AM

## 2020-01-06 NOTE — Progress Notes (Addendum)
Tarri Fuller, patient's father, informed of patient current status and transfer to ICU 12.

## 2020-01-06 NOTE — ED Provider Notes (Signed)
[facility] Department of Emergency Medicine   Code Blue CONSULT NOTE  Chief Complaint: Unresponsive   Level V Caveat: Unresponsive  History of present illness: I was contacted by the hospital for a CODE BLUE upstairs and presented to the patient's bedside.   I was told that the patient was admitted for renal failure, volume overload, and C. difficile infection.  He had dialysis earlier today and had been responsive, taking oral medications, etc.  However when his nurse checked on him just prior to calling the [place], he was unresponsive with agonal respirations and only faintly palpable pulse.  His SPO2 was in the 50s or 60s.   ROS: Unable to obtain, Level V caveat  Scheduled Meds: . amLODipine  10 mg Oral Daily  . ascorbic acid  250 mg Oral BID  . calcium-vitamin D  1 tablet Oral BID WC  . carvedilol  25 mg Oral BID WC  . Chlorhexidine Gluconate Cloth  6 each Topical Daily  . collagenase   Topical Daily  . ferrous sulfate  325 mg Oral BID WC  . folic acid  1 mg Oral Daily  . gabapentin  300 mg Oral BID  . hydrALAZINE  25 mg Oral Q8H  . insulin aspart  0-6 Units Subcutaneous Q4H  . insulin glargine  5 Units Subcutaneous Daily  . irbesartan  300 mg Oral Daily  . lacosamide  100 mg Oral BID  . levETIRAcetam  500 mg Oral BID  . metoCLOPramide  5 mg Oral TID AC  . mirtazapine  30 mg Oral QHS  . pantoprazole  40 mg Oral Daily  . QUEtiapine  25 mg Oral QHS  . sevelamer carbonate  800 mg Oral TID WC   Continuous Infusions: . sodium chloride Stopped (01/05/20 0541)  . sodium chloride    . piperacillin-tazobactam (ZOSYN)  IV 2.25 g (01/05/20 1341)   PRN Meds:.sodium chloride, sodium chloride, acetaminophen, alteplase, heparin, hydrALAZINE, ipratropium-albuterol, lidocaine (PF), lidocaine-prilocaine, loperamide, ondansetron (ZOFRAN) IV, oxyCODONE, pentafluoroprop-tetrafluoroeth, polyethylene glycol Past Medical History:  Diagnosis Date  . CKD (chronic  kidney disease)   . Diabetes mellitus without complication ([place])   . GERD (gastroesophageal reflux disease)   . HTN (hypertension)   . IBS (irritable bowel syndrome)   . Osteomyelitis [facility])    Past Surgical History:  Procedure Laterality Date  . ABDOMINAL AORTOGRAM W/LOWER EXTREMITY Right 12/23/2017   Procedure: ABDOMINAL AORTOGRAM W/LOWER EXTREMITY;  Surgeon: Katha Cabal, MD;  Location: [facility];  Service: Cardiovascular;  Laterality: Right;  . ACHILLES TENDON SURGERY Bilateral 06/16/2018   Procedure: ACHILLES LENGTHENING/KIDNER;  Surgeon: Albertine Patricia, DPM;  Location: [facility];  Service: Podiatry;  Laterality: Bilateral;  . AMPUTATION Right 12/24/2017   Procedure: AMPUTATION RAY;  Surgeon: Sharlotte Alamo, DPM;  Location: [facility];  Service: Podiatry;  Laterality: Right;  . AMPUTATION Left 04/06/2018   Procedure: AMPUTATION RAY;  Surgeon: Sharlotte Alamo, DPM;  Location: [facility];  Service: Podiatry;  Laterality: Left;  . AMPUTATION Left 04/09/2018   Procedure: AMPUTATION RAY;  Surgeon: Sharlotte Alamo, DPM;  Location: [facility];  Service: Podiatry;  Laterality: Left;  . AMPUTATION Right 10/18/2018   Procedure: AMPUTATION BELOW KNEE;  Surgeon: Katha Cabal, MD;  Location: [facility];  Service: Vascular;  Laterality: Right;  . APPLICATION OF WOUND VAC Right 12/24/2017   Procedure: APPLICATION OF WOUND VAC;  Surgeon: Sharlotte Alamo, DPM;  Location: [facility];  Service: Podiatry;  Laterality: Right;  . APPLICATION OF WOUND VAC  Right 01/01/2018   Procedure: APPLICATION OF WOUND VAC;  Surgeon: Albertine Patricia, DPM;  Location: [facility];  Service: Podiatry;  Laterality: Right;  . BONE EXCISION Bilateral 06/16/2018   Procedure: BONE EXCISION AND SOFT TISSUE;  Surgeon: Albertine Patricia, DPM;  Location: [facility];  Service: Podiatry;  Laterality: Bilateral;  . IRRIGATION AND DEBRIDEMENT FOOT Right 12/20/2017   Procedure: IRRIGATION AND DEBRIDEMENT FOOT;  Surgeon: Sharlotte Alamo, DPM;  Location:  [facility];  Service: Podiatry;  Laterality: Right;  . IRRIGATION AND DEBRIDEMENT FOOT Right 12/24/2017   Procedure: IRRIGATION AND DEBRIDEMENT FOOT;  Surgeon: Sharlotte Alamo, DPM;  Location: [facility];  Service: Podiatry;  Laterality: Right;  . IRRIGATION AND DEBRIDEMENT FOOT Right 01/01/2018   Procedure: IRRIGATION AND DEBRIDEMENT FOOT-SKIN,SOFT TISSUE AND BONE;  Surgeon: Albertine Patricia, DPM;  Location: [facility];  Service: Podiatry;  Laterality: Right;  . IRRIGATION AND DEBRIDEMENT FOOT Left 04/06/2018   Procedure: IRRIGATION AND DEBRIDEMENT FOOT;  Surgeon: Sharlotte Alamo, DPM;  Location: [facility];  Service: Podiatry;  Laterality: Left;  . IRRIGATION AND DEBRIDEMENT FOOT Right 09/20/2018   Procedure: IRRIGATION AND DEBRIDEMENT FOOT;  Surgeon: Sharlotte Alamo, DPM;  Location: [facility];  Service: Podiatry;  Laterality: Right;  . LOWER EXTREMITY ANGIOGRAPHY Left 04/06/2018   Procedure: Lower Extremity Angiography;  Surgeon: Algernon Huxley, MD;  Location: [facility];  Service: Cardiovascular;  Laterality: Left;  . LOWER EXTREMITY ANGIOGRAPHY Bilateral 10/13/2018   Procedure: Lower Extremity Angiography;  Surgeon: Katha Cabal, MD;  Location: [facility];  Service: Cardiovascular;  Laterality: Bilateral;   Social History   Socioeconomic History  . Marital status: Single    Spouse name: Not on file  . Number of children: Not on file  . Years of education: Not on file  . Highest education level: Not on file  Occupational History  . Not on file  Tobacco Use  . Smoking status: Never Smoker  . Smokeless tobacco: Never Used  Vaping Use  . Vaping Use: Never used  Substance and Sexual Activity  . Alcohol use: No  . Drug use: Yes    Types: Marijuana, PCP  . Sexual activity: Not on file  Other Topics Concern  . Not on file  Social History Narrative  . Not on file   Social Determinants of Health   Financial Resource Strain:   . Difficulty of Paying Living Expenses:   Food  Insecurity:   . Worried About Charity fundraiser in the Last Year:   . Arboriculturist in the Last Year:   Transportation Needs:   . Film/video editor (Medical):   Marland Kitchen Lack of Transportation (Non-Medical):   Physical Activity:   . Days of Exercise per Week:   . Minutes of Exercise per Session:   Stress:   . Feeling of Stress :   Social Connections:   . Frequency of Communication with Friends and Family:   . Frequency of Social Gatherings with Friends and Family:   . Attends Religious Services:   . Active Member of Clubs or Organizations:   . Attends Archivist Meetings:   Marland Kitchen Marital Status:   Intimate Partner Violence:   . Fear of Current or Ex-Partner:   . Emotionally Abused:   Marland Kitchen Physically Abused:   . Sexually Abused:    Allergies  Allergen Reactions  . Banana Hives, Nausea And Vomiting and Rash  . Keflex [Cephalexin] Rash    No swelling- also taken penicillin without any  issue.  . Sulfa Antibiotics Anaphylaxis  . Grapeseed Extract [Nutritional Supplements] Itching  . Shellfish Allergy Hives    "ALL SEAFOOD"  . Grape Seed Rash    Last set of Vital Signs (not current) Vitals:   01/05/20 1400 01/05/20 1530  BP: (!) 167/96 (!) 158/96  Pulse: (!) 104 (!) 108  Resp: 14 16  Temp:  99.8 F (37.7 C)  SpO2: 95% 93%      Physical Exam  Gen: unresponsive Cardiovascular: Faintly palpable peripheral pulses Resp: Slow agonal respirations. Breath sounds equal bilaterally with bagging  Abd: nondistended  Neuro: GCS 3, unresponsive to pain, no corneal reflex, no gag reflex.  HEENT: No blood in posterior pharynx, gag reflex absent.  Pupils are medium sized, fixed, and unresponsive. Neck: No crepitus  Musculoskeletal: No deformity  Skin: warm  Procedures (when applicable, including Critical Care time): Procedure Name: Intubation Date/Time: 01/05/2020 11:50 PM Performed by: Hinda Kehr, MD Pre-anesthesia Checklist: Patient identified, Emergency Drugs  available, Suction available and Patient being monitored Preoxygenation: Pre-oxygenation with 100% oxygen Induction Type: IV induction and Rapid sequence Laryngoscope Size: Glidescope and 3 Tube size: 7.5 mm Number of attempts: 1 Placement Confirmation: ETT inserted through vocal cords under direct vision,  CO2 detector and Breath sounds checked- equal and bilateral Secured at: 23 cm Tube secured with: Tape Dental Injury: Teeth and Oropharynx as per pre-operative assessment         MDM / Assessment and Plan Patient is unresponsive with a GCS of 3 with agonal respirations.  Respiratory therapy assisted his respirations with bag-valve-mask while RSI was prepared.  I intubated the patient after providing etomidate 10 mg IV and rocuronium 90 mg IV.  Patient's blood pressure just prior to intubation was about 109 systolic.  Prior to administration of RSI medications the patient had no response at all to noxious stimuli and no corneal reflex.  Successful first-pass intubation.  Patient taken immediately to ICU after intubation.  SPO2 100% throughout with good waveform, patient still has palpable pulse.    Hinda Kehr, MD 01/06/20 [phone]

## 2020-01-06 NOTE — Procedures (Signed)
Chest Tube Insertion: Indication: Respiratory failure with large LEFT pleural effusion Consent: Attained via telephone from patient's mother  Risks and benefits explained in detail including risk of infection, bleeding, respiratory failure and death.  Hand washing performed prior to starting the procedure.   Type of Anesthesia: 1 % Lidocaine.   Procedure: An active timeout was performed and correct patient, name, & ID confirmed.  The patient was intubated and mechanically ventilated, consent had been obtained from the patient's mother.  Patient was positioned correctly for chest tube placement. Patient was prepped in a sterile fashion including chlorohexadine preps, sterile drape, sterile gown and sterile gloves.  Ultrasound imaging of the left chest was performed to choose the best area for chest tube insertion.  Introducer needle was placed and guide wire was inserted. Using Seldinger Technique, the introducer needle was removed and 1 dilator was used to create an opening for insertion of chest tube.  A #14 [place] pneumothorax chest tube drainage pigtail chest tube was inserted into the left pleural cavity.  The chest tube was sutured in place and proper dressing applied and taped in place.  Chest tube was placed to -20 cm H2O pressure suction.  Approximately 1700 mL of pale thin fluid was drained.  Findings: Fluid obtained upon insetion of needle, Chest tube placed bewteen 6 -7 ribs midaxiilary line.   Chest tube connected to: [facility] pleural drainage  Number of Attempts: 1  Complications: None evident  Estimated Blood Loss: Less than 5 mL  Chest radiograph indicated no thorax chest tube in place.  Operator: Renold Don, MD  C. Derrill Kay, MD [facility]   *This note was dictated using voice recognition software/Dragon.  Despite best efforts to proofread, errors can occur which can change the meaning.  Any change was purely unintentional.

## 2020-01-06 NOTE — Progress Notes (Addendum)
Name: Shawn [name] MRN: [id number] DOB: 1986/04/09    ADMISSION DATE:  12/29/2019 CONSULTATION DATE:  01/06/2020  REFERRING MD :  Sharion Settler, NP  CHIEF COMPLAINT:  Respiratory Arrest  BRIEF PATIENT DESCRIPTION:  34 y.o. male with PMH of ESRD on HD admitted 12/29/19 due to Acute Hypoxic Respiratory Failure in the setting of Pulmonary edema/Volume Overload and questionable Necrotizing Pneumonia requiring BiPAP.  Nephrology was consulted for urgent HD. Overnight 7/2 had breakthrough seizures and transferred to stepdown, transfer back to floor on 7/3.  On 7/4 found unresponsive with respiratory arrest requiring intubation and transfer to ICU.   SIGNIFICANT EVENTS  6/26: Admission to [facility] 6/28: Transferred to medsurg unit 7/2: Rapid response called for seizure activity and patient transferred back to stepdown unit 7/3: Transfer to Med/Surg 7/4: Found unresponsive, Respiratory arrest requiring intubation and transfer to ICU  STUDIES:  6/26: CXR>>1. Persistent bilateral airspace opacities which may represent multifocal pneumonia or sequela of prior infectious process. 2. Apparent new cavitary lesion in the right mid lung zone. Follow-up with a contrast-enhanced CT of the chest is recommended. 3. Significant cardiomegaly with increased in size since prior study. An underlying pericardial effusion should be considered. 4. Well-positioned tunneled dialysis catheter. 5. Small bilateral pleural effusions. 6/26: CT chest w/ Contrast>>1. Extensive areas of consolidated lung, some of which is likely related to some passive atelectatic change. Areas of hypoattenuating lung parenchyma within these regions of consolidation further supported diagnosis of pneumonia. 2. There is an area of cavitation right upper lobe associated with a larger consolidative opacity, could reflect a necrotic pneumonia. 3. Moderate right and moderate to large left pleural effusion. Possibly parapneumonic and/or  related to patient's volume status however no convincing CT evidence of empyema at this time. 4. Stable numerous scattered mediastinal, hilar and bilateral axillary lymph nodes, some of which appear low-attenuation, likely combination edematous and reactive. 5. Interval development of additional paraseptal emphysematous/bullous changes in the bilateral upper lobes since prior CT. 6. Left IJ approach dual lumen dialysis catheter tip terminates at the of the right atrium. A small amount of gas along the catheter tract likely compatible with the recent placement of this access. 7. Cardiomegaly right heart enlargement, pulmonary artery enlargement and reflux of contrast into the IVC. Findings could suggest pulmonary hypertension and elevated right heart pressure/right heart failure. 8. Additional features of anasarca/volume overload with diffuse body wall and mediastinal edematous changes. Periportal edema could suggest some hepatic congestion in the setting of right heart failure. 9. Mild gallbladder wall thickening, nonspecific possibly related to motion or the hepatic congestion. Correlate with right upper quadrant symptoms and consider right upper quadrant ultrasound if clinically warranted 6/27: 2D Echocardiogram>> 7/2: Diffuse prominent bilateral pulmonary infiltrates/edema again noted. Cavitary lesion right upper lung again noted. Progression of left-sided pleural effusion. 7/4: CT Head>>Negative noncontrast head CT. 7/4: CTA Chest>>1. No central pulmonary embolus. Motion artifact as well as consolidative lung limits assessment. 2. Large left pleural effusion has progressed from prior exam. Complete compressive atelectasis and/or consolidation of the left lower lobe and partial atelectasis of the left upper lobe. Small right pleural effusion is similar. 3. Probable cavitary process in the right mid lung versus loculated air and fluid in the minor fissure. This is unchanged from  recent exam. 4. Generalized body wall edema. Dilated IVC likely passive congestion. 5. Recent but not acute anterior rib fractures. 6. Mediastinal and left greater than right axillary adenopathy, grossly similar allowing for motion.  CULTURES: SARS-CoV-2 PCR 6/26>> Negative MRSA  PCR 6/27> Negative C-Diff 6/27> Positive Blood culture x2 6/26>>No growth x 5 days Strep pneumo urinary antigen 6/26>> Legionella urinary antigen 6/26>> Negative   ANTIBIOTICS: Vancomycin 6/26>> stopped Zosyn 6/26>>  HISTORY OF PRESENT ILLNESS:   Shawn [name] is a 34 year old male with a past medical history significant for ESRD on hemodialysis, hypertension, GERD, diabetes mellitus, IBS, osteomyelitis who presents to [facility] ED on 12/29/2019 due to acute respiratory distress and altered mental status.  Apparently patient's family reported that he had an episode of unresponsiveness with questionable convulsions.  Upon EMS arrival he was noted to be hypoxic with O2 saturations in the 70s of which he was placed on nonrebreather mask.  The patient did miss his scheduled hemodialysis session earlier today due to an episode of diarrhea.  He also had a dialysis catheter placed yesterday to his left chest.  Upon presentation to the ED he remained short of breath and hypoxic.  He was afebrile, respiratory rate in the 20s, normal sinus rhythm, blood pressure 116/83, and 95% O2 saturations on nonrebreather.  Initial work-up revealed BUN 66, creatinine 6.07, BNP 3473, high-sensitivity troponin 20, WBC 5.5, hemoglobin 10.8.  CT Chest revealed extensive areas of consolidation with right upper lobe cavitation concerning for questionable necrotizing pneumonia.  Also noted to have cardiomegaly and moderate right and left pleural effusions.  His COVID-19 PCR is negative.  Given his work of breathing and hypoxia he was subsequently placed on BiPAP.  PCCM is asked to admit the patient to stepdown unit for further work-up and treatment  of acute hypoxic respiratory failure secondary to pulmonary edema/volume overload and questionable necrotizing pneumonia.  Nephrology has been consulted for urgent hemodialysis for volume removal.  On 01/06/20 while on the Med-surg unit, he was found by nursing staff unresponsive, hypoxic (O2 saturations 50s to 60s),  with agonal respirations.  He was found to have a faint palpable pulse.  CODE BLUE was called, of which he was successfully intubated by ED provider.  Post intubation he was transferred to ICU.  CT head is negative, and CTA chest is negative for PE, however does show a progressive large left pleural effusion along with complete compressive atelectasis versus consolidation of the left lower lobe and partial atelectasis of the left upper lobe.  PAST MEDICAL HISTORY :   has a past medical history of CKD (chronic kidney disease), Diabetes mellitus without complication ([place]), GERD (gastroesophageal reflux disease), HTN (hypertension), IBS (irritable bowel syndrome), and Osteomyelitis ([place]).  has a past surgical history that includes Irrigation and debridement foot (Right, 12/20/2017); Irrigation and debridement foot (Right, 12/24/2017); Amputation (Right, 12/24/2017); Application if wound vac (Right, 12/24/2017); ABDOMINAL AORTOGRAM W/LOWER EXTREMITY (Right, 12/23/2017); Irrigation and debridement foot (Right, 01/01/2018); Application if wound vac (Right, 01/01/2018); Irrigation and debridement foot (Left, 04/06/2018); Amputation (Left, 04/06/2018); Lower Extremity Angiography (Left, 04/06/2018); Amputation (Left, 04/09/2018); Achilles tendon surgery (Bilateral, 06/16/2018); Bone excision (Bilateral, 06/16/2018); Irrigation and debridement foot (Right, 09/20/2018); Lower Extremity Angiography (Bilateral, 10/13/2018); and Amputation (Right, 10/18/2018).   Prior to Admission medications   Medication Sig Start Date End Date Taking? Authorizing Provider  ALPRAZolam (XANAX) 0.25 MG tablet Take 1 tablet (0.25 mg total)  by mouth 3 (three) times daily as needed for anxiety. 08/18/18   Gladstone Lighter, MD  amoxicillin-clavulanate (AUGMENTIN) 875-125 MG tablet Take 1 tablet by mouth 2 (two) times daily. Patient not taking: Reported on 10/11/2018 09/21/18   Bettey Costa, MD  calcium carbonate (CALCIUM 600) 600 MG TABS tablet Take 1 tablet (600 mg total) by  mouth 2 (two) times daily with a meal for 30 days. 09/13/18 10/13/18  Milinda Pointer, MD  diphenoxylate-atropine (LOMOTIL) 2.5-0.025 MG tablet Take 1 tablet by mouth 4 (four) times daily as needed for diarrhea or loose stools.    [provider]  ferrous sulfate 325 (65 FE) MG tablet Take 1 tablet (325 mg total) by mouth 2 (two) times daily with a meal. 01/03/18   Sainani, Belia Heman, MD  gabapentin (NEURONTIN) 300 MG capsule Take 1 capsule (300 mg total) by mouth 2 (two) times daily. 09/21/18   Bettey Costa, MD  insulin aspart (NOVOLOG) 100 UNIT/ML injection Inject 4 Units into the skin 3 (three) times daily with meals. 08/18/18   Gladstone Lighter, MD  insulin glargine (LANTUS) 100 UNIT/ML injection Inject 0.3 mLs (30 Units total) into the skin daily. Patient taking differently: Inject 35 Units into the skin 2 (two) times daily.  08/18/18   Gladstone Lighter, MD  Insulin Syringes, Disposable, U-100 0.3 ML MISC 1 Syringe by Does not apply route 4 (four) times daily -  with meals and at bedtime. 12/27/17   Loletha Grayer, MD  levofloxacin (LEVAQUIN) 750 MG tablet Take 1 tablet (750 mg total) by mouth daily. Patient not taking: Reported on 10/11/2018 09/24/18   Carrie Mew, MD  promethazine (PHENERGAN) 25 MG tablet Take 1 tablet (25 mg total) by mouth every 6 (six) hours as needed for nausea or vomiting. 08/18/18   Gladstone Lighter, MD  vitamin C (VITAMIN C) 250 MG tablet Take 1 tablet (250 mg total) by mouth 2 (two) times daily. 06/20/18   Gladstone Lighter, MD  zolpidem (AMBIEN) 10 MG tablet Take 10 mg by mouth at bedtime.     [provider]    Scheduled Meds: . amLODipine  10 mg Oral Daily  . ascorbic acid  250 mg Oral BID  . calcium-vitamin D  1 tablet Oral BID WC  . carvedilol  25 mg Oral BID WC  . Chlorhexidine Gluconate Cloth  6 each Topical Daily  . collagenase   Topical Daily  . ferrous sulfate  325 mg Oral BID WC  . folic acid  1 mg Oral Daily  . gabapentin  300 mg Oral BID  . hydrALAZINE  25 mg Oral Q8H  . insulin aspart  0-6 Units Subcutaneous Q4H  . insulin glargine  5 Units Subcutaneous Daily  . irbesartan  300 mg Oral Daily  . lacosamide  100 mg Oral BID  . levETIRAcetam  500 mg Oral BID  . metoCLOPramide  5 mg Oral TID AC  . mirtazapine  30 mg Oral QHS  . pantoprazole  40 mg Oral Daily  . QUEtiapine  25 mg Oral QHS  . sevelamer carbonate  800 mg Oral TID WC   Continuous Infusions: . sodium chloride Stopped (01/05/20 0541)  . sodium chloride    . piperacillin-tazobactam (ZOSYN)  IV 2.25 g (01/05/20 1341)  . propofol (DIPRIVAN) infusion 30 mcg/kg/min (01/06/20 0104)   PRN Meds:.sodium chloride, sodium chloride, acetaminophen, alteplase, heparin, hydrALAZINE, ipratropium-albuterol, lidocaine (PF), lidocaine-prilocaine, loperamide, ondansetron (ZOFRAN) IV, oxyCODONE, pentafluoroprop-tetrafluoroeth, polyethylene glycol   Allergies  Allergen Reactions  . Banana Hives, Nausea And Vomiting and Rash  . Keflex [Cephalexin] Rash    No swelling- also taken penicillin without any issue.  . Sulfa Antibiotics Anaphylaxis  . Grapeseed Extract [Nutritional Supplements] Itching  . Shellfish Allergy Hives    "ALL SEAFOOD"  . Grape Seed Rash    FAMILY HISTORY:  family history includes Diabetes in  his mother; Healthy in his father; Ovarian cancer in his mother. SOCIAL HISTORY:  reports that he has never smoked. He has never used smokeless tobacco. He reports current drug use. Drugs: Marijuana and PCP. He reports that he does not drink alcohol.  REVIEW OF SYSTEMS: Unable to assess due to intubation and  sedation  SUBJECTIVE:  Unable to assess due to intubation and sedation  VITAL SIGNS: Temp:  [98.6 F (37 C)-99.8 F (37.7 C)] 99.8 F (37.7 C) (07/03 1530) Pulse Rate:  [89-108] 108 (07/03 1530) Resp:  [9-31] 16 (07/03 1530) BP: (127-180)/(68-99) 158/96 (07/03 1530) SpO2:  09/05/87 %-99 %] 93 % (07/03 1530) Weight:  [60.8 kg] 60.8 kg (07/03 0500)  PHYSICAL EXAMINATION: General: Acutely ill-appearing male, laying in bed, intubated and sedated, in no acute distress Neuro: Sedated, arouses to voice, follows commands, no focal deficits HEENT: Atraumatic, normocephalic, neck supple, no JVD, pupils PERRLA, ET tube in place Cardiovascular: Regular rate and rhythm, S1-S2, no murmurs, rubs, gallops Lungs: Coarse crackles to auscultation bilaterally, vent assisted, even, nonlabored Abdomen: Soft, nontender, nondistended, no guarding or rebound tenderness, bowel sounds positive x4 Musculoskeletal: Right BKA, no edema Skin: Warm and mildly diaphoretic.  Stage III sacral decubitus and right hip skin tear.    Recent Labs  Lab 01/02/20 0536 01/03/20 0442 01/05/20 0926  NA 133* 133* 129*  K 3.8 4.7 4.4  CL 94* 93* 91*  CO2 '26 26 26  '$ BUN 42* 53* 38*  CREATININE 4.75* 5.66* 4.51*  GLUCOSE 114* 110* 133*   Recent Labs  Lab 01/02/20 0536 01/03/20 0442 01/05/20 0926  HGB 10.3* 11.1* 9.3*  HCT 32.5* 35.6* 31.7*  WBC 8.8 8.5 8.3  PLT 153 157 146*   DG Chest Port 1 View  Result Date: 01/04/2020 CLINICAL DATA:  Shortness of breath. EXAM: PORTABLE CHEST 1 VIEW COMPARISON:  CT 12/29/2019.  Chest x-ray 12/29/2019. FINDINGS: Dual-lumen central catheter stable position. Cardiomegaly. Diffuse prominent bilateral pulmonary infiltrates/edema again noted. Cavitary lesion right upper lung again noted. Progression of left-sided pleural effusion. Stable right-sided pleural thickening. No pneumothorax. IMPRESSION: 1.  Dual-lumen catheter stable position. 2.  Stable cardiomegaly. 3. Diffuse prominent bilateral  pulmonary infiltrates/edema again noted. Cavitary lesion right upper lung again noted. Progression of left-sided pleural effusion. Electronically Signed   By: Marcello Moores  Register   On: 01/04/2020 07:45   ECHOCARDIOGRAM LIMITED  Result Date: 01/04/2020    ECHOCARDIOGRAM LIMITED REPORT   Patient Name:   HUBER MATHERS Date of Exam: 01/04/2020 Medical Rec #:  [id number]        Height:       66.0 in Accession #:    [id number]       Weight:       128.3 lb Date of Birth:  July 23, 1985        BSA:          1.656 m Patient Age:    61 years         BP:           144/83 mmHg Patient Gender: M                HR:           89 bpm. Exam Location:  [facility] Procedure: 2D Echo, Cardiac Doppler and Limited Color Doppler Indications:     I31.3 Pericardial effusion  History:         Patient has prior history of Echocardiogram examinations, most  recent 12/30/2019. CKD; Risk Factors:Hypertension and Diabetes.  Sonographer:     Charmayne Sheer RDCS (AE) Referring Phys:  [id number] CARMEN Veda Canning Diagnosing Phys: Yolonda Kida MD IMPRESSIONS  1. Left ventricular ejection fraction, by estimation, is 65 to 70%. The left ventricle has normal function. The left ventricle has no regional wall motion abnormalities. Left ventricular diastolic parameters are consistent with Grade I diastolic dysfunction (impaired relaxation).  2. Right ventricular systolic function is normal. The right ventricular size is normal. There is normal pulmonary artery systolic pressure.  3. Large pleural effusion in the left lateral region.  4. The mitral valve is normal in structure. No evidence of mitral valve regurgitation.  5. The aortic valve is grossly normal. FINDINGS  Left Ventricle: Left ventricular ejection fraction, by estimation, is 65 to 70%. The left ventricle has normal function. The left ventricle has no regional wall motion abnormalities. The left ventricular internal cavity size was normal in size. There is  no left ventricular hypertrophy.  Right Ventricle: The right ventricular size is normal. No increase in right ventricular wall thickness. Right ventricular systolic function is normal. There is normal pulmonary artery systolic pressure. Left Atrium: Left atrial size was normal in size. Right Atrium: Right atrial size was normal in size. Pericardium: Trivial pericardial effusion is present. Mitral Valve: The mitral valve is normal in structure. Tricuspid Valve: The tricuspid valve is normal in structure. Tricuspid valve regurgitation is trivial. Aortic Valve: The aortic valve is grossly normal. Pulmonic Valve: The pulmonic valve was normal in structure. Pulmonic valve regurgitation is not visualized. Aorta: The aortic root is normal in size and structure. Additional Comments: There is a large pleural effusion in the left lateral region.  LEFT VENTRICLE PLAX 2D LVIDd:         5.44 cm LVIDs:         3.27 cm LV PW:         1.04 cm LV IVS:        0.75 cm  LEFT ATRIUM         Index LA diam:    3.90 cm 2.36 cm/m Yolonda Kida MD Electronically signed by Yolonda Kida MD Signature Date/Time: 01/04/2020/2:27:12 PM    Final     ASSESSMENT / PLAN:  Respiratory Arrest on 01/06/20 due to Large left pleural effusion and complete compressive atelectasis of LLL & partial atelectasis of LUL Acute Hypoxic Respiratory Failure in the setting of Pulmonary Edema/Volume Overload and ? Necrotizing Pneumonia -Full vent support -Wean FiO2 and PEEP as tolerated to maintain O2 saturation greater than 92% -Follow intermittent chest x-ray and ABG as needed -VAP protocol -Spontaneous breathing trials when respiratory parameters met -Hemodialysis for volume removal -Consider Thoracentesis & Bronchoscopy -Continue antibiotics as above -Check troponin ~ 28  ? Necrotizing Pneumonia -Monitor fever curve -Trend WBCs and procalcitonin -Follow cultures as above -Continue Zosyn  Seizure Activity - Likely breakthrough seizures due to non-compliant with seizure  meds in addition to being on medication that could potentially lower seizure threshold (Levaquin + Zosyn) -Continue home Keppra and Vimpat -Seizure precautions -As needed Ativan for breakthrough seizure -Neurology following, appreciate input   ESRD on HD (T,Th,S) -Monitor I&O's / urinary output -Follow BMP -Ensure adequate renal perfusion -Avoid nephrotoxic agents as able -Replace electrolytes as indicated -Nephrology following, appreciate input -Hemodialysis as per nephrology   Anemia of Chronic Disease -Monitor for S/Sx of bleeding -Trend CBC -SQ Heparin for VTE Prophylaxis  -Transfuse for Hgb <7  Diabetes Mellitus -CBGs -  SSI -Follow ICU hypo/hyperglycemia protocol   Persistent Nausea and vomiting QO:HCOBTVMT Gastroparesis -prn zofran -avoid phenergan--makes him groggy -Reglan IV 5 mg tid  Unstageable coccyx wound, Present on admission -Turn and reposition every two hours -Wound specialist following with dressing changes daily.     Marland Kitchen   [facility] DISPOSITION: ICU GOALS OF CARE: Full Code VTE PROPHYLAXIS: Heparin SubQ STRESS ULCER PROPHYLAXIS: IV Pepcid CONSULTS: Nephrology, Neurology, Wound UPDATES: No family at bedside for update during NP rounds 01/06/20    Darel Hong, AGACNP-BC [facility] Pager: [phone]    01/06/2020, 1:11 AM

## 2020-01-06 NOTE — Progress Notes (Addendum)
OG tube placed as per discussion with NP M [id number]-Yual. OG tubed paced at 60cm, pace confirmed thru xray. Advised to connect OG tube to low intermittent wall suction after meds are given. Pig tail chest tube monitored for clotting as output is serous sanguinous with blood.

## 2020-01-06 NOTE — Progress Notes (Signed)
Patient ID: Shawn [name], male   DOB: 22-Nov-1985, 34 y.o.   MRN: [id number] Code blue was called at midnight.  Pt intubated and on the vent under ICU care.  Will resume care once pt does not need ICU care D/w dr Duwayne Heck

## 2020-01-06 NOTE — Progress Notes (Signed)
Ch arrived at room in response to Code Blue. Health care team were working on Pt. Pt was being moved to ICU. Ch will follow-up as needed. No family present at this time.

## 2020-01-06 NOTE — Progress Notes (Signed)
[facility]  ROUNDING NOTE   Subjective:   Patient had respiratory arrest last evening Was intubated 7/3- 7/4 at midnight Large left pleural effusion  Objective:  Vital signs in last 24 hours:  Temp:  [96.9 F (36.1 C)-99.8 F (37.7 C)] 98.6 F (37 C) (07/04 0800) Pulse Rate:  [75-108] 76 (07/04 0900) Resp:  [11-24] 20 (07/04 0900) BP: (129-188)/(68-104) 166/101 (07/04 0900) SpO2:  [93 %-100 %] 100 % (07/04 0900) FiO2 (%):  [30 %-40 %] 30 % (07/04 0737) Weight:  [61.4 kg] 61.4 kg (07/04 0500)  Weight change: 3.2 kg Filed Weights   01/04/20 0800 01/05/20 0500 01/06/20 0500  Weight: 58.2 kg 60.8 kg 61.4 kg    Intake/Output: I/O last 3 completed shifts: In: 1038.6 [P.O.:720; I.V.:188.5; IV Piggyback:130.1] Out: 1400 [Other:1000; Stool:400]   Intake/Output this shift:  Total I/O In: 77.2 [I.V.:17.8; IV Piggyback:59.4] Out: 0   Physical Exam: General:  No acute distress, laying in the bed  HEENT  anicteric, moist oral mucous membrane  Pulm/lungs  vent assisted  CVS/Heart  no rub or gallop  Abdomen:   Soft, nontender  Extremities:  + peripheral edema  Neurologic:  sedated  Skin:  No acute rashes     Basic Metabolic Panel: Recent Labs  Lab 01/01/20 0507 01/01/20 0507 01/02/20 0536 01/02/20 0536 01/03/20 0442 01/05/20 0926 01/06/20 0003  NA 132*  --  133*  --  133* 129* 133*  K 5.8*  --  3.8  --  4.7 4.4 4.1  CL 98  --  94*  --  93* 91* 95*  CO2 17*  --  26  --  26 26 26   GLUCOSE 124*  --  114*  --  110* 133* 145*  BUN 87*  --  42*  --  53* 38* 27*  CREATININE 8.23*  --  4.75*  --  5.66* 4.51* 3.19*  CALCIUM 8.2*   < > 8.3*   < > 8.5* 8.2* 8.4*  MG 1.9  --  1.6*  --  1.8  --  1.5*  PHOS 8.3*  --  5.4*  --  8.5* 5.8*  --    < > = values in this interval not displayed.    Liver Function Tests: Recent Labs  Lab 12/31/19 0605 01/05/20 0926 01/06/20 0003  AST 10*  --  13*  ALT 13  --  10  ALKPHOS 85  --  77  BILITOT 0.6  --  0.7  PROT  6.6  --  6.2*  ALBUMIN 3.0* 2.8* 2.5*   No results for input(s): LIPASE, AMYLASE in the last 168 hours. No results for input(s): AMMONIA in the last 168 hours.  CBC: Recent Labs  Lab 12/31/19 0605 01/01/20 0507 01/01/20 0950 01/02/20 0536 01/03/20 0442 01/05/20 0926 01/06/20 0003  WBC 12.8*   < > 10.8* 8.8 8.5 8.3 4.3  NEUTROABS 8.6*  --   --   --   --   --  3.1  HGB 10.4*   < > 10.0* 10.3* 11.1* 9.3* 8.3*  HCT 33.3*   < > 32.9* 32.5* 35.6* 31.7* 28.5*  MCV 86.0   < > 88.2 85.3 86.2 91.1 91.3  PLT 207   < > 192 153 157 146* 137*   < > = values in this interval not displayed.    Cardiac Enzymes: No results for input(s): CKTOTAL, CKMB, CKMBINDEX, TROPONINI in the last 168 hours.  BNP: Invalid input(s): POCBNP  CBG: Recent Labs  Lab  01/05/20 2126 01/05/20 2339 01/06/20 0006 01/06/20 0441 01/06/20 0728  GLUCAP 112* 130* 145* 133* 95    Microbiology: Results for orders placed or performed during the hospital encounter of 12/29/19  SARS Coronavirus 2 by RT PCR (hospital order, performed in [facility] hospital lab) Nasopharyngeal Nasopharyngeal Swab     Status: None   Collection Time: 12/29/19 10:59 PM   Specimen: Nasopharyngeal Swab  Result Value Ref Range Status   SARS Coronavirus 2 NEGATIVE NEGATIVE Final    Comment: (NOTE) SARS-CoV-2 target nucleic acids are NOT DETECTED.  The SARS-CoV-2 RNA is generally detectable in upper and lower respiratory specimens during the acute phase of infection. The lowest concentration of SARS-CoV-2 viral copies this assay can detect is 250 copies / mL. A negative result does not preclude SARS-CoV-2 infection and should not be used as the sole basis for treatment or other patient management decisions.  A negative result may occur with improper specimen collection / handling, submission of specimen other than nasopharyngeal swab, presence of viral mutation(s) within the areas targeted by this assay, and inadequate number of viral  copies (<250 copies / mL). A negative result must be combined with clinical observations, patient history, and epidemiological information.  Fact Sheet for Patients:   StrictlyIdeas.no  Fact Sheet for Healthcare Providers: BankingDealers.co.za  This test is not yet approved or  cleared by the [place] FDA and has been authorized for detection and/or diagnosis of SARS-CoV-2 by FDA under an Emergency Use Authorization (EUA).  This EUA will remain in effect (meaning this test can be used) for the duration of the COVID-19 declaration under Section 564(b)(1) of the Act, 21 U.S.C. section 360bbb-3(b)(1), unless the authorization is terminated or revoked sooner.  Performed at [facility], [address] [place], [place] [address]   Blood culture (routine x 2)     Status: None   Collection Time: 12/29/19 10:59 PM   Specimen: BLOOD  Result Value Ref Range Status   Specimen Description BLOOD RIGHT ANTECUBITAL  Final   Special Requests   Final    BOTTLES DRAWN AEROBIC AND ANAEROBIC Blood Culture adequate volume   Culture   Final    NO GROWTH 5 DAYS Performed at [facility], [address] [place], [place] [address]    Report Status 01/03/2020 FINAL  Final  MRSA PCR Screening     Status: None   Collection Time: 12/30/19 12:25 AM   Specimen: Nasopharyngeal  Result Value Ref Range Status   MRSA by PCR NEGATIVE NEGATIVE Final    Comment:        The GeneXpert MRSA Assay (FDA approved for NASAL specimens only), is one component of a comprehensive MRSA colonization surveillance program. It is not intended to diagnose MRSA infection nor to guide or monitor treatment for MRSA infections. Performed at [facility], [place], [place] [address]   C Difficile Quick Screen w PCR reflex     Status: Abnormal   Collection Time: 12/30/19  1:50 PM   Specimen: STOOL  Result Value Ref Range Status    C Diff antigen POSITIVE (A) NEGATIVE Final   C Diff toxin NEGATIVE NEGATIVE Final   C Diff interpretation Results are indeterminate. See PCR results.  Final    Comment: Performed at [facility], [address] [place], [place] [address]  C. Diff by PCR, Reflexed     Status: Abnormal   Collection Time: 12/30/19  1:50 PM  Result Value Ref Range  Status   Toxigenic C. Difficile by PCR POSITIVE (A) NEGATIVE Final    Comment: Positive for toxigenic C. difficile with little to no toxin production. Only treat if clinical presentation suggests symptomatic illness. Performed at [facility], [address] [place], [place] [address]     Coagulation Studies: No results for input(s): LABPROT, INR in the last 72 hours.  Urinalysis: No results for input(s): COLORURINE, LABSPEC, PHURINE, GLUCOSEU, HGBUR, BILIRUBINUR, KETONESUR, PROTEINUR, UROBILINOGEN, NITRITE, LEUKOCYTESUR in the last 72 hours.  Invalid input(s): APPERANCEUR    Imaging: CT HEAD WO CONTRAST  Result Date: 01/06/2020 CLINICAL DATA:  Altered mental status.  Post code. EXAM: CT HEAD WITHOUT CONTRAST TECHNIQUE: Contiguous axial images were obtained from the base of the skull through the vertex without intravenous contrast. COMPARISON:  CT 12/10/2019 FINDINGS: Brain: No intracranial hemorrhage, mass effect, or midline shift. No cerebral edema or loss of gray-white differentiation. No hydrocephalus. The basilar cisterns are patent. No evidence of territorial infarct or acute ischemia. No extra-axial or intracranial fluid collection. Vascular: No hyperdense vessel or unexpected calcification. Skull: No fracture or focal lesion. Sinuses/Orbits: Minimal opacification of right mastoid air cells may be related to intubation. The remaining paranasal sinuses are clear. Orbits are unremarkable. Other: None. IMPRESSION: Negative noncontrast head CT. Electronically Signed   By: Keith Rake M.D.   On: 01/06/2020 01:16   CT  ANGIO CHEST PE W OR WO CONTRAST  Result Date: 01/06/2020 CLINICAL DATA:  Respiratory failure. EXAM: CT ANGIOGRAPHY CHEST WITH CONTRAST TECHNIQUE: Multidetector CT imaging of the chest was performed using the standard protocol during bolus administration of intravenous contrast. Multiplanar CT image reconstructions and MIPs were obtained to evaluate the vascular anatomy. CONTRAST:  38mL OMNIPAQUE IOHEXOL 350 MG/ML SOLN COMPARISON:  Most recent radiograph 01/04/2020.  Chest CT 12/29/2019 FINDINGS: Cardiovascular: There are no central filling defects in the pulmonary arteries. Motion artifact as well as consolidative lung limits assessment. Evaluation is diagnostic to the lobar in some segmental branches. Again seen cardiomegaly. No contrast refluxing into the IVC on the current exam. Left-sided dialysis catheter remains in place. No evidence of aortic dissection, allowing for mild motion artifact. Mediastinum/Nodes: Endotracheal tube above the carina. Edema throughout the mediastinum as well as scattered lymph nodes, not well assessed given motion, but grossly stable in the short interim. Left greater than right axillary adenopathy, partially obscured by motion but grossly similar to prior. Lungs/Pleura: Large left pleural effusion has progressed from prior exam. Complete compressive atelectasis and/or consolidation of the left lower lobe and partial atelectasis of the left upper lobe. Small right pleural effusion is similar. Improved consolidations in the right lower lobe from prior exam. Loculated air in fluid along the right minor fissure which appears similar to prior exam, difficult to exclude cavitary process. Generalized improvement in the tree in bud and ground-glass opacities in the right upper lobe with mild residual. No pneumothorax. Upper Abdomen: Dilated IVC likely passive congestion. Mild edema throughout the upper abdominal fat. No definite change from prior exam. Musculoskeletal: Similar appearing  anterior bilateral rib fractures from prior, likely recent but not acute. There is some surrounding callus formation. No sternal fracture. Generalized body wall edema. Review of the MIP images confirms the above findings. IMPRESSION: 1. No central pulmonary embolus. Motion artifact as well as consolidative lung limits assessment. 2. Large left pleural effusion has progressed from prior exam. Complete compressive atelectasis and/or consolidation of the left lower lobe and partial atelectasis of the left upper lobe. Small right pleural effusion  is similar. 3. Probable cavitary process in the right mid lung versus loculated air and fluid in the minor fissure. This is unchanged from recent exam. 4. Generalized body wall edema. Dilated IVC likely passive congestion. 5. Recent but not acute anterior rib fractures. 6. Mediastinal and left greater than right axillary adenopathy, grossly similar allowing for motion. Electronically Signed   By: Keith Rake M.D.   On: 01/06/2020 01:12   DG Chest Port 1 View  Result Date: 01/06/2020 CLINICAL DATA:  Intubation. EXAM: PORTABLE CHEST 1 VIEW COMPARISON:  Chest CT earlier this day. FINDINGS: Endotracheal tube tip 6.1 cm from the carina. Left-sided dialysis catheter in place. Large left pleural effusion and compressive opacities. Right pleural effusion better assessed on recent CT. Cavitary process in the right mid lung again seen. Unchanged heart size. No other interval change. IMPRESSION: 1. Endotracheal tube tip 6.1 cm from the carina. 2. Large left pleural effusion, unchanged from CT earlier this day. 3. Right pleural effusion better assessed on recent CT. Presumed cavitary process in the right mid lung unchanged. Electronically Signed   By: Keith Rake M.D.   On: 01/06/2020 02:27     Medications:   . sodium chloride Stopped (01/05/20 0541)  . sodium chloride    . [START ON 01/07/2020] famotidine (PEPCID) IV    . lacosamide (VIMPAT) IV    .  piperacillin-tazobactam (ZOSYN)  IV Stopped (01/06/20 0254)  . propofol (DIPRIVAN) infusion 30 mcg/kg/min (01/06/20 0819)   . amLODipine  10 mg Oral Daily  . ascorbic acid  250 mg Oral BID  . calcium-vitamin D  1 tablet Oral BID WC  . carvedilol  25 mg Oral BID WC  . Chlorhexidine Gluconate Cloth  6 each Topical Daily  . collagenase   Topical Daily  . docusate  100 mg Oral BID  . ferrous sulfate  325 mg Oral BID WC  . folic acid  1 mg Oral Daily  . gabapentin  300 mg Oral BID  . hydrALAZINE  25 mg Oral Q8H  . insulin aspart  0-6 Units Subcutaneous Q4H  . insulin glargine  5 Units Subcutaneous Daily  . irbesartan  300 mg Oral Daily  . levETIRAcetam  500 mg Oral BID  . metoCLOPramide  5 mg Oral TID AC  . mirtazapine  30 mg Oral QHS  . pantoprazole  40 mg Oral Daily  . polyethylene glycol  17 g Oral Daily  . QUEtiapine  25 mg Oral QHS  . sevelamer carbonate  800 mg Oral TID WC   sodium chloride, sodium chloride, acetaminophen, alteplase, fentaNYL (SUBLIMAZE) injection, fentaNYL (SUBLIMAZE) injection, heparin, hydrALAZINE, ipratropium-albuterol, lidocaine (PF), lidocaine-prilocaine, loperamide, ondansetron (ZOFRAN) IV, oxyCODONE, pentafluoroprop-tetrafluoroeth, polyethylene glycol  Assessment/ Plan:  Shawn [name] is a 34 y.o. white male with end stage renal disease on hemodialysis, hypertension, diabetes mellitus, GERD, IBS, osteomyelitis who was admitted to [facility] on 12/29/2019 for End stage renal disease on dialysis ([place]) [N18.6, Z99.2] Acute respiratory failure with hypoxia ([place]) [J96.01]   [facility] nephrology  1. End Stage renal disease:  TTS schedule.   Routine HD was done on saturday; 1000 cc removed Next HD planned for tuesday  2. Anemia of CKD Lab Results  Component Value Date   HGB 8.3 (L) 01/06/2020  EPO with HD   3.  Secondary Hyperparathyroidism:  - Calcium carbonate with meals. Lab Results  Component Value Date   CALCIUM 8.4 (L) 01/06/2020    PHOS 5.8 (H) 01/05/2020  4.  Seizure disorder - had breakthrough sz. Was seen by neurologist.  - restarted home regimen of Vimpat and Keppra.        LOS: 8 Shawn [name] 7/4/20219:42 AM

## 2020-01-07 ENCOUNTER — Inpatient Hospital Stay: Payer: Medicaid Other

## 2020-01-07 LAB — CBC WITH DIFFERENTIAL/PLATELET
Abs Immature Granulocytes: 0.01 10*3/uL (ref 0.00–0.07)
Basophils Absolute: 0 10*3/uL (ref 0.0–0.1)
Basophils Relative: 1 %
Eosinophils Absolute: 0.1 10*3/uL (ref 0.0–0.5)
Eosinophils Relative: 3 %
HCT: 29.4 % — ABNORMAL LOW (ref 39.0–52.0)
Hemoglobin: 9.8 g/dL — ABNORMAL LOW (ref 13.0–17.0)
Immature Granulocytes: 0 %
Lymphocytes Relative: 23 %
Lymphs Abs: 0.9 10*3/uL (ref 0.7–4.0)
MCH: 27.1 pg (ref 26.0–34.0)
MCHC: 33.3 g/dL (ref 30.0–36.0)
MCV: 81.2 fL (ref 80.0–100.0)
Monocytes Absolute: 0.8 10*3/uL (ref 0.1–1.0)
Monocytes Relative: 21 %
Neutro Abs: 2.1 10*3/uL (ref 1.7–7.7)
Neutrophils Relative %: 52 %
Platelets: 151 10*3/uL (ref 150–400)
RBC: 3.62 MIL/uL — ABNORMAL LOW (ref 4.22–5.81)
RDW: 16.9 % — ABNORMAL HIGH (ref 11.5–15.5)
WBC: 4.1 10*3/uL (ref 4.0–10.5)
nRBC: 0 % (ref 0.0–0.2)

## 2020-01-07 LAB — GLUCOSE, CAPILLARY
Glucose-Capillary: 95 mg/dL (ref 70–99)
Glucose-Capillary: 86 mg/dL (ref 70–99)
Glucose-Capillary: 91 mg/dL (ref 70–99)
Glucose-Capillary: 91 mg/dL (ref 70–99)
Glucose-Capillary: 113 mg/dL — ABNORMAL HIGH (ref 70–99)

## 2020-01-07 LAB — RENAL FUNCTION PANEL
Albumin: 2.3 g/dL — ABNORMAL LOW (ref 3.5–5.0)
Anion gap: 14 (ref 5–15)
BUN: 34 mg/dL — ABNORMAL HIGH (ref 6–20)
CO2: 22 mmol/L (ref 22–32)
Calcium: 8.2 mg/dL — ABNORMAL LOW (ref 8.9–10.3)
Chloride: 95 mmol/L — ABNORMAL LOW (ref 98–111)
Creatinine, Ser: 4.02 mg/dL — ABNORMAL HIGH (ref 0.61–1.24)
GFR calc Af Amer: 21 mL/min — ABNORMAL LOW (ref >60)
GFR calc non Af Amer: 18 mL/min — ABNORMAL LOW (ref >60)
Glucose, Bld: 93 mg/dL (ref 70–99)
Phosphorus: 4.1 mg/dL (ref 2.5–4.6)
Potassium: 4 mmol/L (ref 3.5–5.1)
Sodium: 131 mmol/L — ABNORMAL LOW (ref 135–145)

## 2020-01-07 LAB — ACTH STIMULATION, 3 TIME POINTS
Cortisol, 30 Min: 15.2 ug/dL
Cortisol, 60 Min: 18.3 ug/dL
Cortisol, Base: 15.8 ug/dL

## 2020-01-07 MED ORDER — ALPRAZOLAM 0.25 MG PO TABS
0.2500 mg | ORAL_TABLET | Freq: Once | ORAL | Status: AC
  Administered 2020-01-07: 0.25 mg via ORAL
  Filled 2020-01-07: qty 1

## 2020-01-07 MED ORDER — DEXTROSE 10 % IV SOLN: INTRAVENOUS | Status: DC

## 2020-01-07 NOTE — Progress Notes (Signed)
Patient successfully extubated to 2L [place] with no complications. Saturations on 2L are 98% and patient is tolerating well at this time.

## 2020-01-07 NOTE — Progress Notes (Signed)
Initial Nutrition Assessment  DOCUMENTATION CODES:   Not applicable  INTERVENTION:   If tube feeds initiated, recommend:  Vital 1.2@40ml /hr + Prostat 32ml BID via tube   Propofol: 10.94 ml/hr- provides 289kcal/day   Free water flushes 69ml q4 hours to maintain tube patency   Regimen provides 1641kcal/day, 102g/day protein and 971ml/day free water   Recommend Rena-vite daily via tube  Recommend liquid MVI daily via tube   Recommend Juven Fruit Punch BID via tube, each serving provides 95kcal and 2.5g of protein (amino acids glutamine and arginine)  NUTRITION DIAGNOSIS:   Severe Malnutrition related to chronic illness (DM, ESRD on HD) as evidenced by mild to moderate fat depletions, moderate to severe muscle depletions.  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   Pt sedated and ventilated. OGT in place. Chest tube removed. Pt is well known to nutrition department and this RD from multiple previous admits. Pt with good appetite and oral intake at baseline. Pt eating 50% of meals prior to intubation on 7/3. No plans for tube feeds today; plan is for possible extubation.   Per chart, pt up ~20lbs from his UBW currently. Pt is fairly weight stable at baseline.   Medications reviewed and include: vitamin C, oscal w/ D, ferrous sulfate, folic acid, insulin, remeron, protonix, miralax, renvela, 10% dextrose @30ml /hr, pepcid, zosyn, propofol   Labs reviewed: Na 131(L), BUN 34(H), creat 4.02(H), P 4.1 wnl Hgb 9.8(L), Hct 29.4(L) cbgs- 95, 86, 91 x 24 hrs AIC  7.4(H)- 6/27  Patient is currently intubated on ventilator support MV: 7.5 L/min Temp (24hrs), Avg:98.4 F (36.9 C), Min:97.5 F (36.4 C), Max:98.8 F (37.1 C)  Propofol: 10.94 ml/hr- provides 289kcal/day   MAP- >39mmHg  NUTRITION - FOCUSED PHYSICAL EXAM:    Most Recent Value  Orbital Region No depletion  Upper Arm Region Moderate depletion  Thoracic and Lumbar Region Mild depletion  Buccal Region No depletion  Temple Region Mild depletion  Clavicle Bone Region Moderate depletion  Clavicle and Acromion Bone Region Moderate depletion  Scapular Bone Region Mild depletion  Dorsal Hand Mild depletion  Patellar Region Severe depletion  Anterior Thigh Region Severe depletion  Posterior Calf Region Severe depletion  Edema (RD Assessment) Mild  Hair Reviewed  Eyes Reviewed  Mouth Reviewed  Skin Reviewed  Nails Reviewed     Diet Order:   Diet Order            Diet NPO time specified  Diet effective now                EDUCATION NEEDS:   No education needs have been identified at this time  Skin:  Skin Assessment: Reviewed RN Assessment (Stage III sacrum, Stage II hip)  Last BM:  7/5- type 7  Height:   Ht Readings from Last 1 Encounters:  01/06/20 5' 5.98" (1.676 m)    Weight:   Wt Readings from Last 1 Encounters:  01/07/20 61.3 kg    Ideal Body Weight:  60.7 kg (adjusted for BKA)  BMI:  Body mass index is 21.82 kg/m.  Estimated Nutritional Needs:   Kcal:  1584kcal/day  Protein:  95-105g/day  Fluid:  UOP +1L  [name] MS, RD, LDN Please refer to [facility] for RD and/or RD on-call/weekend/after hours pager

## 2020-01-07 NOTE — Progress Notes (Signed)
Name: Shawn [name] MRN: [id number] DOB: 07-12-1985    ADMISSION DATE:  12/29/2019 CONSULTATION DATE:  01/06/2020  REFERRING MD :  Sharion Settler, NP  CHIEF COMPLAINT:  Respiratory Arrest  BRIEF PATIENT DESCRIPTION:  34 y.o. male with PMH of ESRD on HD admitted 12/29/19 due to Acute Hypoxic Respiratory Failure in the setting of Pulmonary edema/Volume Overload and questionable Necrotizing Pneumonia requiring BiPAP.  Nephrology was consulted for urgent HD. Overnight 7/2 had breakthrough seizures and transferred to stepdown, transfer back to floor on 7/3.  On 7/4 found unresponsive with respiratory arrest requiring intubation and transfer to ICU.   SIGNIFICANT EVENTS  6/26: Admission to [facility] 6/28: Transferred to medsurg unit 7/2: Rapid response called for seizure activity and patient transferred back to stepdown unit 7/3: Transfer to Med/Surg 7/4: Found unresponsive, Respiratory arrest requiring intubation and transfer to ICU 01/07/20- patient remains intubated, was having bath during my eval this am. No acute events overnight. Sedated to RASS-1.  Chest tube with low volume drainage overnight. Rectal tube with decreased output. Sacral and hip pressure sores dressed.   STUDIES:  6/26: CXR>>1. Persistent bilateral airspace opacities which may represent multifocal pneumonia or sequela of prior infectious process. 2. Apparent new cavitary lesion in the right mid lung zone. Follow-up with a contrast-enhanced CT of the chest is recommended. 3. Significant cardiomegaly with increased in size since prior study. An underlying pericardial effusion should be considered. 4. Well-positioned tunneled dialysis catheter. 5. Small bilateral pleural effusions. 6/26: CT chest w/ Contrast>>1. Extensive areas of consolidated lung, some of which is likely related to some passive atelectatic change. Areas of hypoattenuating lung parenchyma within these regions of consolidation further supported diagnosis  of pneumonia. 2. There is an area of cavitation right upper lobe associated with a larger consolidative opacity, could reflect a necrotic pneumonia. 3. Moderate right and moderate to large left pleural effusion. Possibly parapneumonic and/or related to patient's volume status however no convincing CT evidence of empyema at this time. 4. Stable numerous scattered mediastinal, hilar and bilateral axillary lymph nodes, some of which appear low-attenuation, likely combination edematous and reactive. 5. Interval development of additional paraseptal emphysematous/bullous changes in the bilateral upper lobes since prior CT. 6. Left IJ approach dual lumen dialysis catheter tip terminates at the of the right atrium. A small amount of gas along the catheter tract likely compatible with the recent placement of this access. 7. Cardiomegaly right heart enlargement, pulmonary artery enlargement and reflux of contrast into the IVC. Findings could suggest pulmonary hypertension and elevated right heart pressure/right heart failure. 8. Additional features of anasarca/volume overload with diffuse body wall and mediastinal edematous changes. Periportal edema could suggest some hepatic congestion in the setting of right heart failure. 9. Mild gallbladder wall thickening, nonspecific possibly related to motion or the hepatic congestion. Correlate with right upper quadrant symptoms and consider right upper quadrant ultrasound if clinically warranted 6/27: 2D Echocardiogram>> 7/2: Diffuse prominent bilateral pulmonary infiltrates/edema again noted. Cavitary lesion right upper lung again noted. Progression of left-sided pleural effusion. 7/4: CT Head>>Negative noncontrast head CT. 7/4: CTA Chest>>1. No central pulmonary embolus. Motion artifact as well as consolidative lung limits assessment. 2. Large left pleural effusion has progressed from prior exam. Complete compressive atelectasis and/or  consolidation of the left lower lobe and partial atelectasis of the left upper lobe. Small right pleural effusion is similar. 3. Probable cavitary process in the right mid lung versus loculated air and fluid in the minor fissure. This is unchanged from recent exam.  4. Generalized body wall edema. Dilated IVC likely passive congestion. 5. Recent but not acute anterior rib fractures. 6. Mediastinal and left greater than right axillary adenopathy, grossly similar allowing for motion.  CULTURES: SARS-CoV-2 PCR 6/26>> Negative MRSA PCR 6/27> Negative C-Diff 6/27> Positive Blood culture x2 6/26>>No growth x 5 days Strep pneumo urinary antigen 6/26>> Legionella urinary antigen 6/26>> Negative   ANTIBIOTICS: Vancomycin 6/26>> stopped Zosyn 6/26>>  HISTORY OF PRESENT ILLNESS:   Shawn [name] is a 34 year old male with a past medical history significant for ESRD on hemodialysis, hypertension, GERD, diabetes mellitus, IBS, osteomyelitis who presents to [facility] ED on 12/29/2019 due to acute respiratory distress and altered mental status.  Apparently patient's family reported that he had an episode of unresponsiveness with questionable convulsions.  Upon EMS arrival he was noted to be hypoxic with O2 saturations in the 70s of which he was placed on nonrebreather mask.  The patient did miss his scheduled hemodialysis session earlier today due to an episode of diarrhea.  He also had a dialysis catheter placed yesterday to his left chest.  Upon presentation to the ED he remained short of breath and hypoxic.  He was afebrile, respiratory rate in the 20s, normal sinus rhythm, blood pressure 116/83, and 95% O2 saturations on nonrebreather.  Initial work-up revealed BUN 66, creatinine 6.07, BNP 3473, high-sensitivity troponin 20, WBC 5.5, hemoglobin 10.8.  CT Chest revealed extensive areas of consolidation with right upper lobe cavitation concerning for questionable necrotizing pneumonia.  Also noted to have  cardiomegaly and moderate right and left pleural effusions.  His COVID-19 PCR is negative.  Given his work of breathing and hypoxia he was subsequently placed on BiPAP.  PCCM is asked to admit the patient to stepdown unit for further work-up and treatment of acute hypoxic respiratory failure secondary to pulmonary edema/volume overload and questionable necrotizing pneumonia.  Nephrology has been consulted for urgent hemodialysis for volume removal.  On 01/06/20 while on the Med-surg unit, he was found by nursing staff unresponsive, hypoxic (O2 saturations 50s to 60s),  with agonal respirations.  He was found to have a faint palpable pulse.  CODE BLUE was called, of which he was successfully intubated by ED provider.  Post intubation he was transferred to ICU.  CT head is negative, and CTA chest is negative for PE, however does show a progressive large left pleural effusion along with complete compressive atelectasis versus consolidation of the left lower lobe and partial atelectasis of the left upper lobe.  PAST MEDICAL HISTORY :   has a past medical history of CKD (chronic kidney disease), Diabetes mellitus without complication ([place]), GERD (gastroesophageal reflux disease), HTN (hypertension), IBS (irritable bowel syndrome), and Osteomyelitis ([place]).  has a past surgical history that includes Irrigation and debridement foot (Right, 12/20/2017); Irrigation and debridement foot (Right, 12/24/2017); Amputation (Right, 12/24/2017); Application if wound vac (Right, 12/24/2017); ABDOMINAL AORTOGRAM W/LOWER EXTREMITY (Right, 12/23/2017); Irrigation and debridement foot (Right, 01/01/2018); Application if wound vac (Right, 01/01/2018); Irrigation and debridement foot (Left, 04/06/2018); Amputation (Left, 04/06/2018); Lower Extremity Angiography (Left, 04/06/2018); Amputation (Left, 04/09/2018); Achilles tendon surgery (Bilateral, 06/16/2018); Bone excision (Bilateral, 06/16/2018); Irrigation and debridement foot (Right,  09/20/2018); Lower Extremity Angiography (Bilateral, 10/13/2018); and Amputation (Right, 10/18/2018).   Prior to Admission medications   Medication Sig Start Date End Date Taking? Authorizing Provider  ALPRAZolam (XANAX) 0.25 MG tablet Take 1 tablet (0.25 mg total) by mouth 3 (three) times daily as needed for anxiety. 08/18/18   Gladstone Lighter, MD  amoxicillin-clavulanate (AUGMENTIN) [phone]  MG tablet Take 1 tablet by mouth 2 (two) times daily. Patient not taking: Reported on 10/11/2018 09/21/18   Bettey Costa, MD  calcium carbonate (CALCIUM 600) 600 MG TABS tablet Take 1 tablet (600 mg total) by mouth 2 (two) times daily with a meal for 30 days. 09/13/18 10/13/18  Milinda Pointer, MD  diphenoxylate-atropine (LOMOTIL) 2.5-0.025 MG tablet Take 1 tablet by mouth 4 (four) times daily as needed for diarrhea or loose stools.    [provider]  ferrous sulfate 325 (65 FE) MG tablet Take 1 tablet (325 mg total) by mouth 2 (two) times daily with a meal. 01/03/18   Sainani, Belia Heman, MD  gabapentin (NEURONTIN) 300 MG capsule Take 1 capsule (300 mg total) by mouth 2 (two) times daily. 09/21/18   Bettey Costa, MD  insulin aspart (NOVOLOG) 100 UNIT/ML injection Inject 4 Units into the skin 3 (three) times daily with meals. 08/18/18   Gladstone Lighter, MD  insulin glargine (LANTUS) 100 UNIT/ML injection Inject 0.3 mLs (30 Units total) into the skin daily. Patient taking differently: Inject 35 Units into the skin 2 (two) times daily.  08/18/18   Gladstone Lighter, MD  Insulin Syringes, Disposable, U-100 0.3 ML MISC 1 Syringe by Does not apply route 4 (four) times daily -  with meals and at bedtime. 12/27/17   Loletha Grayer, MD  levofloxacin (LEVAQUIN) 750 MG tablet Take 1 tablet (750 mg total) by mouth daily. Patient not taking: Reported on 10/11/2018 09/24/18   Carrie Mew, MD  promethazine (PHENERGAN) 25 MG tablet Take 1 tablet (25 mg total) by mouth every 6 (six) hours as needed for nausea or vomiting.  08/18/18   Gladstone Lighter, MD  vitamin C (VITAMIN C) 250 MG tablet Take 1 tablet (250 mg total) by mouth 2 (two) times daily. 06/20/18   Gladstone Lighter, MD  zolpidem (AMBIEN) 10 MG tablet Take 10 mg by mouth at bedtime.     [provider]   Scheduled Meds: . amLODipine  10 mg Oral Daily  . ascorbic acid  250 mg Oral BID  . calcium-vitamin D  1 tablet Oral BID WC  . carvedilol  25 mg Oral BID WC  . chlorhexidine gluconate (MEDLINE KIT)  15 mL Mouth Rinse BID  . Chlorhexidine Gluconate Cloth  6 each Topical Daily  . collagenase   Topical Daily  . docusate  100 mg Oral BID  . [START ON 01/08/2020] epoetin (EPOGEN/PROCRIT) injection  4,000 Units Intravenous Q T,Th,Sa-HD  . ferrous sulfate  325 mg Oral BID WC  . folic acid  1 mg Oral Daily  . gabapentin  300 mg Oral BID  . hydrALAZINE  25 mg Oral Q8H  . insulin aspart  0-6 Units Subcutaneous Q4H  . irbesartan  300 mg Oral Daily  . levETIRAcetam  500 mg Oral BID  . mouth rinse  15 mL Mouth Rinse 10 times per day  . metoCLOPramide  5 mg Oral TID AC  . mirtazapine  30 mg Oral QHS  . pantoprazole  40 mg Oral Daily  . polyethylene glycol  17 g Oral Daily  . QUEtiapine  25 mg Oral QHS  . sevelamer carbonate  800 mg Oral TID WC   Continuous Infusions: . sodium chloride Stopped (01/05/20 0541)  . sodium chloride    . dextrose 60 mL/hr at 01/07/20 0600  . famotidine (PEPCID) IV Stopped (01/07/20 0532)  . lacosamide (VIMPAT) IV 100 mg (01/07/20 0825)  . piperacillin-tazobactam (ZOSYN)  IV 2.25 g (  01/07/20 0502)  . propofol (DIPRIVAN) infusion 60 mcg/kg/min (01/07/20 0928)   PRN Meds:.sodium chloride, sodium chloride, acetaminophen, alteplase, fentaNYL (SUBLIMAZE) injection, fentaNYL (SUBLIMAZE) injection, heparin, hydrALAZINE, ipratropium-albuterol, lidocaine (PF), lidocaine-prilocaine, loperamide, ondansetron (ZOFRAN) IV, oxyCODONE, pentafluoroprop-tetrafluoroeth, polyethylene glycol   Allergies  Allergen Reactions  .  Banana Hives, Nausea And Vomiting and Rash  . Keflex [Cephalexin] Rash    No swelling- also taken penicillin without any issue.  . Sulfa Antibiotics Anaphylaxis  . Grapeseed Extract [Nutritional Supplements] Itching  . Shellfish Allergy Hives    "ALL SEAFOOD"  . Grape Seed Rash    FAMILY HISTORY:  family history includes Diabetes in his mother; Healthy in his father; Ovarian cancer in his mother. SOCIAL HISTORY:  reports that he has never smoked. He has never used smokeless tobacco. He reports current drug use. Drugs: Marijuana and PCP. He reports that he does not drink alcohol.  REVIEW OF SYSTEMS: Unable to assess due to intubation and sedation  SUBJECTIVE:  Unable to assess due to intubation and sedation  VITAL SIGNS: Temp:  [96.9 F (36.1 C)-98.8 F (37.1 C)] 98.8 F (37.1 C) (07/05 0600) Pulse Rate:  [63-73] 66 (07/05 0700) Resp:  [19-21] 20 (07/05 0400) BP: (144-179)/(90-106) 175/97 (07/05 0700) SpO2:  [99 %-100 %] 100 % (07/05 0754) FiO2 (%):  [28 %] 28 % (07/05 0754) Weight:  [61.3 kg] 61.3 kg (07/05 0354)  PHYSICAL EXAMINATION: General: Acutely ill-appearing male, laying in bed, intubated and sedated, Neuro: Sedated on MV GCS5T HEENT: Atraumatic, normocephalic, neck supple, no JVD, pupils PERRLA, ET tube in place Cardiovascular: Regular rate and rhythm, S1-S2, no murmurs, rubs, gallops Lungs: Coarse crackles to auscultation bilaterally, vent assisted, even, nonlabored- +left chest pigtail pleural cathter Abdomen: Soft, nontender, nondistended, no guarding or rebound tenderness, bowel sounds positive x4 Musculoskeletal: Right BKA, no edema Skin: Warm and mildly diaphoretic.  Stage III sacral decubitus and right hip skin tear.    Recent Labs  Lab 01/05/20 0926 01/06/20 0003 01/07/20 0435  NA 129* 133* 131*  K 4.4 4.1 4.0  CL 91* 95* 95*  CO2 '26 26 22  '$ BUN 38* 27* 34*  CREATININE 4.51* 3.19* 4.02*  GLUCOSE 133* 145* 93   Recent Labs  Lab 01/05/20 0926  01/06/20 0003 01/07/20 0435  HGB 9.3* 8.3* 9.8*  HCT 31.7* 28.5* 29.4*  WBC 8.3 4.3 4.1  PLT 146* 137* 151   DG Abd 1 View  Result Date: 01/06/2020 CLINICAL DATA:  Confirm orogastric tube placement. EXAM: ABDOMEN - 1 VIEW COMPARISON:  10/26/2018 FINDINGS: Orogastric tube has been placed, tip overlying the level of the mid stomach. Small bore pigtail catheter overlies the LEFT lung base. Bowel gas pattern is nonobstructive. IMPRESSION: Orogastric tube tip overlying the level of the mid stomach. Electronically Signed   By: Nolon Nations M.D.   On: 01/06/2020 20:46   CT HEAD WO CONTRAST  Result Date: 01/06/2020 CLINICAL DATA:  Altered mental status.  Post code. EXAM: CT HEAD WITHOUT CONTRAST TECHNIQUE: Contiguous axial images were obtained from the base of the skull through the vertex without intravenous contrast. COMPARISON:  CT 12/10/2019 FINDINGS: Brain: No intracranial hemorrhage, mass effect, or midline shift. No cerebral edema or loss of gray-white differentiation. No hydrocephalus. The basilar cisterns are patent. No evidence of territorial infarct or acute ischemia. No extra-axial or intracranial fluid collection. Vascular: No hyperdense vessel or unexpected calcification. Skull: No fracture or focal lesion. Sinuses/Orbits: Minimal opacification of right mastoid air cells may be related to intubation. The remaining paranasal  sinuses are clear. Orbits are unremarkable. Other: None. IMPRESSION: Negative noncontrast head CT. Electronically Signed   By: Keith Rake M.D.   On: 01/06/2020 01:16   CT ANGIO CHEST PE W OR WO CONTRAST  Result Date: 01/06/2020 CLINICAL DATA:  Respiratory failure. EXAM: CT ANGIOGRAPHY CHEST WITH CONTRAST TECHNIQUE: Multidetector CT imaging of the chest was performed using the standard protocol during bolus administration of intravenous contrast. Multiplanar CT image reconstructions and MIPs were obtained to evaluate the vascular anatomy. CONTRAST:  67m OMNIPAQUE  IOHEXOL 350 MG/ML SOLN COMPARISON:  Most recent radiograph 01/04/2020.  Chest CT 12/29/2019 FINDINGS: Cardiovascular: There are no central filling defects in the pulmonary arteries. Motion artifact as well as consolidative lung limits assessment. Evaluation is diagnostic to the lobar in some segmental branches. Again seen cardiomegaly. No contrast refluxing into the IVC on the current exam. Left-sided dialysis catheter remains in place. No evidence of aortic dissection, allowing for mild motion artifact. Mediastinum/Nodes: Endotracheal tube above the carina. Edema throughout the mediastinum as well as scattered lymph nodes, not well assessed given motion, but grossly stable in the short interim. Left greater than right axillary adenopathy, partially obscured by motion but grossly similar to prior. Lungs/Pleura: Large left pleural effusion has progressed from prior exam. Complete compressive atelectasis and/or consolidation of the left lower lobe and partial atelectasis of the left upper lobe. Small right pleural effusion is similar. Improved consolidations in the right lower lobe from prior exam. Loculated air in fluid along the right minor fissure which appears similar to prior exam, difficult to exclude cavitary process. Generalized improvement in the tree in bud and ground-glass opacities in the right upper lobe with mild residual. No pneumothorax. Upper Abdomen: Dilated IVC likely passive congestion. Mild edema throughout the upper abdominal fat. No definite change from prior exam. Musculoskeletal: Similar appearing anterior bilateral rib fractures from prior, likely recent but not acute. There is some surrounding callus formation. No sternal fracture. Generalized body wall edema. Review of the MIP images confirms the above findings. IMPRESSION: 1. No central pulmonary embolus. Motion artifact as well as consolidative lung limits assessment. 2. Large left pleural effusion has progressed from prior exam. Complete  compressive atelectasis and/or consolidation of the left lower lobe and partial atelectasis of the left upper lobe. Small right pleural effusion is similar. 3. Probable cavitary process in the right mid lung versus loculated air and fluid in the minor fissure. This is unchanged from recent exam. 4. Generalized body wall edema. Dilated IVC likely passive congestion. 5. Recent but not acute anterior rib fractures. 6. Mediastinal and left greater than right axillary adenopathy, grossly similar allowing for motion. Electronically Signed   By: MKeith RakeM.D.   On: 01/06/2020 01:12   DG Chest Port 1 View  Result Date: 01/06/2020 CLINICAL DATA:  LEFT chest tube placement.  Code last night. EXAM: PORTABLE CHEST 1 VIEW COMPARISON:  01/06/2020 at 1:30 a.m. FINDINGS: Patient has LEFT-sided dialysis catheter with tips overlying the superior vena cava. Endotracheal tube is in place, tip 5.1 centimeters above the carina. Interval placement of LEFT-sided chest tube. LEFT pleural effusion is significantly decreased. No pneumothorax. There has been slight improvement in aeration of the lungs bilaterally. There are persistent bilateral patchy opacities. IMPRESSION: 1. Interval placement of LEFT-sided chest tube. 2. Improved aeration of the lungs bilaterally. Electronically Signed   By: ENolon NationsM.D.   On: 01/06/2020 15:20   DG Chest Port 1 View  Result Date: 01/06/2020 CLINICAL DATA:  Intubation. EXAM: PORTABLE  CHEST 1 VIEW COMPARISON:  Chest CT earlier this day. FINDINGS: Endotracheal tube tip 6.1 cm from the carina. Left-sided dialysis catheter in place. Large left pleural effusion and compressive opacities. Right pleural effusion better assessed on recent CT. Cavitary process in the right mid lung again seen. Unchanged heart size. No other interval change. IMPRESSION: 1. Endotracheal tube tip 6.1 cm from the carina. 2. Large left pleural effusion, unchanged from CT earlier this day. 3. Right pleural effusion  better assessed on recent CT. Presumed cavitary process in the right mid lung unchanged. Electronically Signed   By: Keith Rake M.D.   On: 01/06/2020 02:27     ASSESSMENT / PLAN:  Severe acute hypoxemic respiratory failure  -Respiratory Arrest on 01/06/20 due to Large left pleural effusion and complete compressive atelectasis of LLL -recent hx Necrotizing Pneumonia -Full vent support -Wean FiO2 and PEEP as tolerated to maintain O2 saturation greater than 92% -Follow intermittent chest x-ray and ABG as needed -VAP protocol -Spontaneous breathing trials when respiratory parameters met -Hemodialysis for volume removal -s/p chest tube as above - improved pl effusion   Possible persistent Necrotizing Pneumonia -Monitor fever curve -Trend WBCs and procalcitonin-within referece range.  -Follow cultures as above -tracheal aspirate- negative while at [facility]- previosly CRE -Zosyn 9 days completed -plan for 10d   Moderate protein-calorie malnutrition Dietary consultation Bitemporal and peripheral muscle wasting     Seizure Activity - Likely breakthrough seizures due to non-compliant with seizure meds in addition to being on medication that could potentially lower seizure threshold (Levaquin + Zosyn) -Continue home Keppra and Vimpat -Seizure precautions -As needed Ativan for breakthrough seizure -Neurology following, appreciate input   ESRD on HD (T,Th,S) -Monitor I&O's / urinary output -Follow BMP -Ensure adequate renal perfusion -Avoid nephrotoxic agents as able -Replace electrolytes as indicated -Nephrology following, appreciate input -Hemodialysis as per nephrology   Anemia of Chronic Disease -Monitor for S/Sx of bleeding -Trend CBC -SQ Heparin for VTE Prophylaxis  -Transfuse for Hgb <7  Diabetes Mellitus -CBGs -SSI -Follow ICU hypo/hyperglycemia protocol   Persistent Nausea and vomiting [id number] Gastroparesis -prn zofran -avoid phenergan--makes him  groggy -Reglan IV 5 mg tid  Unstageable coccyx wound, Present on admission -Turn and reposition every two hours -Wound specialist following with dressing changes daily.     BEST PRACTICES DISPOSITION: ICU GOALS OF CARE: Full Code VTE PROPHYLAXIS: Heparin SubQ STRESS ULCER PROPHYLAXIS: IV Pepcid CONSULTS: Nephrology, Neurology, Wound UPDATES: No family at bedside for update during NP rounds 01/06/20    Critical care provider statement:    Critical care time (minutes):  33   Critical care time was exclusive of:  Separately billable procedures and  treating other patients   Critical care was necessary to treat or prevent imminent or  life-threatening deterioration of the following conditions:  Aucute hypoxemic respirator failure, ESRD, fluid overload, large pleural effusion, multiple comorbid conditions.    Critical care was time spent personally by me on the following  activities:  Development of treatment plan with patient or surrogate,  discussions with consultants, evaluation of patient's response to  treatment, examination of patient, obtaining history from patient or  surrogate, ordering and performing treatments and interventions, ordering  and review of laboratory studies and re-evaluation of patient's condition   I assumed direction of critical care for this patient from another  provider in my specialty: no      Ottie Glazier, M.D.  Pulmonary & Saline    01/07/2020,  9:33 AM

## 2020-01-07 NOTE — Progress Notes (Signed)
[facility]  ROUNDING NOTE   Subjective:   He had left chest tube placement with improvement in respiratory parameters Remains sedated with propofol Left chest tube with serosanguineous drainage Ventilator dependent, FiO2 28% Getting D5W at 60 cc/h   Objective:  Vital signs in last 24 hours:  Temp:  [96.9 F (36.1 C)-98.8 F (37.1 C)] 98.8 F (37.1 C) (07/05 0600) Pulse Rate:  [63-73] 66 (07/05 0700) Resp:  [19-21] 20 (07/05 0400) BP: (144-179)/(90-106) 175/97 (07/05 0700) SpO2:  [99 %-100 %] 100 % (07/05 0754) FiO2 (%):  [28 %] 28 % (07/05 0754) Weight:  [61.3 kg] 61.3 kg (07/05 0354)  Weight change: -0.1 kg Filed Weights   01/05/20 0500 01/06/20 0500 01/07/20 0354  Weight: 60.8 kg 61.4 kg 61.3 kg    Intake/Output: I/O last 3 completed shifts: In: 1793.8 [I.V.:1414.5; NG/GT:150; IV Piggyback:229.3] Out: 2480 [Emesis/NG output:50; Stool:500; Chest OEUM:3536]   Intake/Output this shift:  No intake/output data recorded.  Physical Exam: General:  Critically ill-appearing, laying in the bed  HEENT  ET tube, OG tube in place  Pulm/lungs  vent assisted, coarse breath sounds  CVS/Heart  no rub or gallop  Abdomen:   Soft, nontender  Extremities:  + peripheral edema  Neurologic:  sedated  Skin:  No acute rashes  Left IJ PermCath   Basic Metabolic Panel: Recent Labs  Lab 01/01/20 0507 01/01/20 0507 01/02/20 0536 01/02/20 0536 01/03/20 0442 01/03/20 0442 01/05/20 0926 01/06/20 0003 01/07/20 0435  NA 132*   < > 133*  --  133*  --  129* 133* 131*  K 5.8*   < > 3.8  --  4.7  --  4.4 4.1 4.0  CL 98   < > 94*  --  93*  --  91* 95* 95*  CO2 17*   < > 26  --  26  --  '26 26 22  '$ GLUCOSE 124*   < > 114*  --  110*  --  133* 145* 93  BUN 87*   < > 42*  --  53*  --  38* 27* 34*  CREATININE 8.23*   < > 4.75*  --  5.66*  --  4.51* 3.19* 4.02*  CALCIUM 8.2*   < > 8.3*   < > 8.5*   < > 8.2* 8.4* 8.2*  MG 1.9  --  1.6*  --  1.8  --   --  1.5*  --   PHOS 8.3*   --  5.4*  --  8.5*  --  5.8*  --  4.1   < > = values in this interval not displayed.    Liver Function Tests: Recent Labs  Lab 01/05/20 0926 01/06/20 0003 01/07/20 0435  AST  --  13*  --   ALT  --  10  --   ALKPHOS  --  77  --   BILITOT  --  0.7  --   PROT  --  6.2*  --   ALBUMIN 2.8* 2.5* 2.3*   No results for input(s): LIPASE, AMYLASE in the last 168 hours. No results for input(s): AMMONIA in the last 168 hours.  CBC: Recent Labs  Lab 01/02/20 0536 01/03/20 0442 01/05/20 0926 01/06/20 0003 01/07/20 0435  WBC 8.8 8.5 8.3 4.3 4.1  NEUTROABS  --   --   --  3.1 2.1  HGB 10.3* 11.1* 9.3* 8.3* 9.8*  HCT 32.5* 35.6* 31.7* 28.5* 29.4*  MCV 85.3 86.2 91.1 91.3 81.2  PLT  153 157 146* 137* 151    Cardiac Enzymes: No results for input(s): CKTOTAL, CKMB, CKMBINDEX, TROPONINI in the last 168 hours.  BNP: Invalid input(s): POCBNP  CBG: Recent Labs  Lab 01/06/20 1843 01/06/20 2024 01/06/20 2327 01/07/20 0331 01/07/20 0732  GLUCAP 112* 113* 84 95 86    Microbiology: Results for orders placed or performed during the hospital encounter of 12/29/19  SARS Coronavirus 2 by RT PCR (hospital order, performed in [facility] hospital lab) Nasopharyngeal Nasopharyngeal Swab     Status: None   Collection Time: 12/29/19 10:59 PM   Specimen: Nasopharyngeal Swab  Result Value Ref Range Status   SARS Coronavirus 2 NEGATIVE NEGATIVE Final    Comment: (NOTE) SARS-CoV-2 target nucleic acids are NOT DETECTED.  The SARS-CoV-2 RNA is generally detectable in upper and lower respiratory specimens during the acute phase of infection. The lowest concentration of SARS-CoV-2 viral copies this assay can detect is 250 copies / mL. A negative result does not preclude SARS-CoV-2 infection and should not be used as the sole basis for treatment or other patient management decisions.  A negative result may occur with improper specimen collection / handling, submission of specimen other than  nasopharyngeal swab, presence of viral mutation(s) within the areas targeted by this assay, and inadequate number of viral copies (<250 copies / mL). A negative result must be combined with clinical observations, patient history, and epidemiological information.  Fact Sheet for Patients:   StrictlyIdeas.no  Fact Sheet for Healthcare Providers: BankingDealers.co.za  This test is not yet approved or  cleared by the [place] FDA and has been authorized for detection and/or diagnosis of SARS-CoV-2 by FDA under an Emergency Use Authorization (EUA).  This EUA will remain in effect (meaning this test can be used) for the duration of the COVID-19 declaration under Section 564(b)(1) of the Act, 21 U.S.C. section 360bbb-3(b)(1), unless the authorization is terminated or revoked sooner.  Performed at [facility], [address] [place], [place] [address]   Blood culture (routine x 2)     Status: None   Collection Time: 12/29/19 10:59 PM   Specimen: BLOOD  Result Value Ref Range Status   Specimen Description BLOOD RIGHT ANTECUBITAL  Final   Special Requests   Final    BOTTLES DRAWN AEROBIC AND ANAEROBIC Blood Culture adequate volume   Culture   Final    NO GROWTH 5 DAYS Performed at [facility], [address] [place], [place] [address]    Report Status 01/03/2020 FINAL  Final  MRSA PCR Screening     Status: None   Collection Time: 12/30/19 12:25 AM   Specimen: Nasopharyngeal  Result Value Ref Range Status   MRSA by PCR NEGATIVE NEGATIVE Final    Comment:        The GeneXpert MRSA Assay (FDA approved for NASAL specimens only), is one component of a comprehensive MRSA colonization surveillance program. It is not intended to diagnose MRSA infection nor to guide or monitor treatment for MRSA infections. Performed at [facility], [place], [place] [address]   C Difficile Quick  Screen w PCR reflex     Status: Abnormal   Collection Time: 12/30/19  1:50 PM   Specimen: STOOL  Result Value Ref Range Status   C Diff antigen POSITIVE (A) NEGATIVE Final   C Diff toxin NEGATIVE NEGATIVE Final   C Diff interpretation Results are indeterminate. See PCR results.  Final    Comment: Performed at [facility], [address] [place], [place] [address]  C. Diff by PCR, Reflexed     Status: Abnormal   Collection Time: 12/30/19  1:50 PM  Result Value Ref Range Status   Toxigenic C. Difficile by PCR POSITIVE (A) NEGATIVE Final    Comment: Positive for toxigenic C. difficile with little to no toxin production. Only treat if clinical presentation suggests symptomatic illness. Performed at [facility], [address], [place], [place] [address]   Body fluid culture     Status: None (Preliminary result)   Collection Time: 01/06/20  3:08 PM   Specimen: Body Fluid  Result Value Ref Range Status   Specimen Description   Final    FLUID Performed at [facility], [address] [place], [place] [address]    Special Requests   Final    NONE Performed at [facility], [address] [place], [place] [address]    Gram Stain   Final    RARE WBC PRESENT, PREDOMINANTLY MONONUCLEAR NO ORGANISMS SEEN    Culture   Final    NO GROWTH < 24 HOURS Performed at [facility], [address], [place], [place] [address]    Report Status PENDING  Incomplete    Coagulation Studies: No results for input(s): LABPROT, INR in the last 72 hours.  Urinalysis: No results for input(s): COLORURINE, LABSPEC, PHURINE, GLUCOSEU, HGBUR, BILIRUBINUR, KETONESUR, PROTEINUR, UROBILINOGEN, NITRITE, LEUKOCYTESUR in the last 72 hours.  Invalid input(s): APPERANCEUR    Imaging: DG Abd 1 View  Result Date: 01/06/2020 CLINICAL DATA:  Confirm orogastric tube placement. EXAM: ABDOMEN - 1 VIEW COMPARISON:  10/26/2018 FINDINGS: Orogastric tube has been placed, tip  overlying the level of the mid stomach. Small bore pigtail catheter overlies the LEFT lung base. Bowel gas pattern is nonobstructive. IMPRESSION: Orogastric tube tip overlying the level of the mid stomach. Electronically Signed   By: Norva Pavlov M.D.   On: 01/06/2020 20:46   CT HEAD WO CONTRAST  Result Date: 01/06/2020 CLINICAL DATA:  Altered mental status.  Post code. EXAM: CT HEAD WITHOUT CONTRAST TECHNIQUE: Contiguous axial images were obtained from the base of the skull through the vertex without intravenous contrast. COMPARISON:  CT 12/10/2019 FINDINGS: Brain: No intracranial hemorrhage, mass effect, or midline shift. No cerebral edema or loss of gray-white differentiation. No hydrocephalus. The basilar cisterns are patent. No evidence of territorial infarct or acute ischemia. No extra-axial or intracranial fluid collection. Vascular: No hyperdense vessel or unexpected calcification. Skull: No fracture or focal lesion. Sinuses/Orbits: Minimal opacification of right mastoid air cells may be related to intubation. The remaining paranasal sinuses are clear. Orbits are unremarkable. Other: None. IMPRESSION: Negative noncontrast head CT. Electronically Signed   By: Narda Rutherford M.D.   On: 01/06/2020 01:16   CT ANGIO CHEST PE W OR WO CONTRAST  Result Date: 01/06/2020 CLINICAL DATA:  Respiratory failure. EXAM: CT ANGIOGRAPHY CHEST WITH CONTRAST TECHNIQUE: Multidetector CT imaging of the chest was performed using the standard protocol during bolus administration of intravenous contrast. Multiplanar CT image reconstructions and MIPs were obtained to evaluate the vascular anatomy. CONTRAST:  22mL OMNIPAQUE IOHEXOL 350 MG/ML SOLN COMPARISON:  Most recent radiograph 01/04/2020.  Chest CT 12/29/2019 FINDINGS: Cardiovascular: There are no central filling defects in the pulmonary arteries. Motion artifact as well as consolidative lung limits assessment. Evaluation is diagnostic to the lobar in some segmental  branches. Again seen cardiomegaly. No contrast refluxing into the IVC on the current exam. Left-sided  dialysis catheter remains in place. No evidence of aortic dissection, allowing for mild motion artifact. Mediastinum/Nodes: Endotracheal tube above the carina. Edema throughout the mediastinum as well as scattered lymph nodes, not well assessed given motion, but grossly stable in the short interim. Left greater than right axillary adenopathy, partially obscured by motion but grossly similar to prior. Lungs/Pleura: Large left pleural effusion has progressed from prior exam. Complete compressive atelectasis and/or consolidation of the left lower lobe and partial atelectasis of the left upper lobe. Small right pleural effusion is similar. Improved consolidations in the right lower lobe from prior exam. Loculated air in fluid along the right minor fissure which appears similar to prior exam, difficult to exclude cavitary process. Generalized improvement in the tree in bud and ground-glass opacities in the right upper lobe with mild residual. No pneumothorax. Upper Abdomen: Dilated IVC likely passive congestion. Mild edema throughout the upper abdominal fat. No definite change from prior exam. Musculoskeletal: Similar appearing anterior bilateral rib fractures from prior, likely recent but not acute. There is some surrounding callus formation. No sternal fracture. Generalized body wall edema. Review of the MIP images confirms the above findings. IMPRESSION: 1. No central pulmonary embolus. Motion artifact as well as consolidative lung limits assessment. 2. Large left pleural effusion has progressed from prior exam. Complete compressive atelectasis and/or consolidation of the left lower lobe and partial atelectasis of the left upper lobe. Small right pleural effusion is similar. 3. Probable cavitary process in the right mid lung versus loculated air and fluid in the minor fissure. This is unchanged from recent exam. 4.  Generalized body wall edema. Dilated IVC likely passive congestion. 5. Recent but not acute anterior rib fractures. 6. Mediastinal and left greater than right axillary adenopathy, grossly similar allowing for motion. Electronically Signed   By: Keith Rake M.D.   On: 01/06/2020 01:12   DG Chest Port 1 View  Result Date: 01/06/2020 CLINICAL DATA:  LEFT chest tube placement.  Code last night. EXAM: PORTABLE CHEST 1 VIEW COMPARISON:  01/06/2020 at 1:30 a.m. FINDINGS: Patient has LEFT-sided dialysis catheter with tips overlying the superior vena cava. Endotracheal tube is in place, tip 5.1 centimeters above the carina. Interval placement of LEFT-sided chest tube. LEFT pleural effusion is significantly decreased. No pneumothorax. There has been slight improvement in aeration of the lungs bilaterally. There are persistent bilateral patchy opacities. IMPRESSION: 1. Interval placement of LEFT-sided chest tube. 2. Improved aeration of the lungs bilaterally. Electronically Signed   By: Nolon Nations M.D.   On: 01/06/2020 15:20   DG Chest Port 1 View  Result Date: 01/06/2020 CLINICAL DATA:  Intubation. EXAM: PORTABLE CHEST 1 VIEW COMPARISON:  Chest CT earlier this day. FINDINGS: Endotracheal tube tip 6.1 cm from the carina. Left-sided dialysis catheter in place. Large left pleural effusion and compressive opacities. Right pleural effusion better assessed on recent CT. Cavitary process in the right mid lung again seen. Unchanged heart size. No other interval change. IMPRESSION: 1. Endotracheal tube tip 6.1 cm from the carina. 2. Large left pleural effusion, unchanged from CT earlier this day. 3. Right pleural effusion better assessed on recent CT. Presumed cavitary process in the right mid lung unchanged. Electronically Signed   By: Keith Rake M.D.   On: 01/06/2020 02:27     Medications:    sodium chloride Stopped (01/05/20 0541)   sodium chloride     dextrose 60 mL/hr at 01/07/20 0600    famotidine (PEPCID) IV Stopped (01/07/20 0532)   lacosamide (VIMPAT)  IV 100 mg (01/07/20 0825)   piperacillin-tazobactam (ZOSYN)  IV 2.25 g (01/07/20 0502)   propofol (DIPRIVAN) infusion 60 mcg/kg/min (01/07/20 0928)    amLODipine  10 mg Oral Daily   ascorbic acid  250 mg Oral BID   calcium-vitamin D  1 tablet Oral BID WC   carvedilol  25 mg Oral BID WC   chlorhexidine gluconate (MEDLINE KIT)  15 mL Mouth Rinse BID   Chlorhexidine Gluconate Cloth  6 each Topical Daily   collagenase   Topical Daily   docusate  100 mg Oral BID   [START ON 01/08/2020] epoetin (EPOGEN/PROCRIT) injection  4,000 Units Intravenous Q T,Th,Sa-HD   ferrous sulfate  325 mg Oral BID WC   folic acid  1 mg Oral Daily   gabapentin  300 mg Oral BID   hydrALAZINE  25 mg Oral Q8H   insulin aspart  0-6 Units Subcutaneous Q4H   irbesartan  300 mg Oral Daily   levETIRAcetam  500 mg Oral BID   mouth rinse  15 mL Mouth Rinse 10 times per day   metoCLOPramide  5 mg Oral TID AC   mirtazapine  30 mg Oral QHS   pantoprazole  40 mg Oral Daily   polyethylene glycol  17 g Oral Daily   QUEtiapine  25 mg Oral QHS   sevelamer carbonate  800 mg Oral TID WC   sodium chloride, sodium chloride, acetaminophen, alteplase, fentaNYL (SUBLIMAZE) injection, fentaNYL (SUBLIMAZE) injection, heparin, hydrALAZINE, ipratropium-albuterol, lidocaine (PF), lidocaine-prilocaine, loperamide, ondansetron (ZOFRAN) IV, oxyCODONE, pentafluoroprop-tetrafluoroeth, polyethylene glycol  Assessment/ Plan:  Shawn [name] is a 34 y.o. white male with end stage renal disease on hemodialysis, hypertension, diabetes mellitus, GERD, IBS, osteomyelitis who was admitted to [facility] on 12/29/2019 for End stage renal disease on dialysis ([place]) [N18.6, Z99.2] Acute respiratory failure with hypoxia ([place]) [J96.01]   [facility] nephrology  1. End Stage renal disease:  TTS schedule.   Routine HD was done on saturday; 1000 cc  removed Next HD planned for tuesday  2. Anemia of CKD Lab Results  Component Value Date   HGB 9.8 (L) 01/07/2020  EPO with HD   3.  Secondary Hyperparathyroidism:  - Calcium carbonate with meals. Lab Results  Component Value Date   CALCIUM 8.2 (L) 01/07/2020   PHOS 4.1 01/07/2020    4.  Seizure disorder - had breakthrough sz. Was seen by neurologist.  - restarted home regimen of Vimpat and Keppra.  5.  Large left pleural effusion, acute respiratory failure Chest tube placed July 4.  1700 cc removed Now with serosanguineous drainage Multifocal bilateral pneumonia seen on chest x-ray        LOS: 9 Sebastain Fishbaugh 7/5/20219:46 AM

## 2020-01-08 ENCOUNTER — Inpatient Hospital Stay: Payer: Medicaid Other

## 2020-01-08 LAB — CBC
HCT: 29.5 % — ABNORMAL LOW (ref 39.0–52.0)
Hemoglobin: 9.5 g/dL — ABNORMAL LOW (ref 13.0–17.0)
MCH: 26.6 pg (ref 26.0–34.0)
MCHC: 32.2 g/dL (ref 30.0–36.0)
MCV: 82.6 fL (ref 80.0–100.0)
Platelets: 146 10*3/uL — ABNORMAL LOW (ref 150–400)
RBC: 3.57 MIL/uL — ABNORMAL LOW (ref 4.22–5.81)
RDW: 16.5 % — ABNORMAL HIGH (ref 11.5–15.5)
WBC: 4.9 10*3/uL (ref 4.0–10.5)
nRBC: 0 % (ref 0.0–0.2)

## 2020-01-08 LAB — RENAL FUNCTION PANEL
Albumin: 2.2 g/dL — ABNORMAL LOW (ref 3.5–5.0)
Anion gap: 14 (ref 5–15)
BUN: 38 mg/dL — ABNORMAL HIGH (ref 6–20)
CO2: 22 mmol/L (ref 22–32)
Calcium: 8 mg/dL — ABNORMAL LOW (ref 8.9–10.3)
Chloride: 89 mmol/L — ABNORMAL LOW (ref 98–111)
Creatinine, Ser: 4.8 mg/dL — ABNORMAL HIGH (ref 0.61–1.24)
GFR calc Af Amer: 17 mL/min — ABNORMAL LOW (ref >60)
GFR calc non Af Amer: 15 mL/min — ABNORMAL LOW (ref >60)
Glucose, Bld: 153 mg/dL — ABNORMAL HIGH (ref 70–99)
Phosphorus: 5.4 mg/dL — ABNORMAL HIGH (ref 2.5–4.6)
Potassium: 4.2 mmol/L (ref 3.5–5.1)
Sodium: 125 mmol/L — ABNORMAL LOW (ref 135–145)

## 2020-01-08 LAB — GLUCOSE, CAPILLARY
Glucose-Capillary: 130 mg/dL — ABNORMAL HIGH (ref 70–99)
Glucose-Capillary: 138 mg/dL — ABNORMAL HIGH (ref 70–99)
Glucose-Capillary: 144 mg/dL — ABNORMAL HIGH (ref 70–99)
Glucose-Capillary: 149 mg/dL — ABNORMAL HIGH (ref 70–99)
Glucose-Capillary: 174 mg/dL — ABNORMAL HIGH (ref 70–99)
Glucose-Capillary: 174 mg/dL — ABNORMAL HIGH (ref 70–99)

## 2020-01-08 LAB — MAGNESIUM: Magnesium: 1.5 mg/dL — ABNORMAL LOW (ref 1.7–2.4)

## 2020-01-08 MED ORDER — SODIUM CHLORIDE 1 G PO TABS
2.0000 g | ORAL_TABLET | Freq: Three times a day (TID) | ORAL | Status: AC
  Administered 2020-01-08 (×3): 2 g via ORAL
  Filled 2020-01-08 (×3): qty 2

## 2020-01-08 MED ORDER — MAGNESIUM SULFATE 4 GM/100ML IV SOLN
4.0000 g | Freq: Once | INTRAVENOUS | Status: AC
  Administered 2020-01-08: 4 g via INTRAVENOUS
  Filled 2020-01-08: qty 100

## 2020-01-08 MED ORDER — CHLORHEXIDINE GLUCONATE CLOTH 2 % EX PADS
6.0000 | MEDICATED_PAD | Freq: Every day | CUTANEOUS | Status: DC
  Administered 2020-01-10 – 2020-01-11 (×3): 6 via TOPICAL

## 2020-01-08 NOTE — Progress Notes (Signed)
Name: Shawn [name] MRN: [id number] DOB: Oct 02, 1985    ADMISSION DATE:  12/29/2019 CONSULTATION DATE:  01/06/2020  REFERRING MD :  Sharion Settler, NP  CHIEF COMPLAINT:  Respiratory Arrest  BRIEF PATIENT DESCRIPTION:  34 y.o. male with PMH of ESRD on HD admitted 12/29/19 due to Acute Hypoxic Respiratory Failure in the setting of Pulmonary edema/Volume Overload and questionable Necrotizing Pneumonia requiring BiPAP.  Nephrology was consulted for urgent HD. Overnight 7/2 had breakthrough seizures and transferred to stepdown, transfer back to floor on 7/3.  On 7/4 found unresponsive with respiratory arrest requiring intubation and transfer to ICU.   SIGNIFICANT EVENTS  6/26: Admission to [place] 6/28: Transferred to medsurg unit 7/2: Rapid response called for seizure activity and patient transferred back to stepdown unit 7/3: Transfer to Med/Surg 7/4: Found unresponsive, Respiratory arrest requiring intubation and transfer to ICU 01/07/20- patient remains intubated, was having bath during my eval this am. No acute events overnight. Sedated to RASS-1.  Chest tube with low volume drainage overnight. Rectal tube with decreased output. Sacral and hip pressure sores dressed.  01/08/20- patient liberated from MV yesterday, we were able to remove chest tube on left. Patient is lucid, communicative and is being optimized for d/c from ICU  STUDIES 6/26: CXR>>1. Persistent bilateral airspace opacities which may represent multifocal pneumonia or sequela of prior infectious process. 2. Apparent new cavitary lesion in the right mid lung zone. Follow-up with a contrast-enhanced CT of the chest is recommended. 3. Significant cardiomegaly with increased in size since prior study. An underlying pericardial effusion should be considered. 4. Well-positioned tunneled dialysis catheter. 5. Small bilateral pleural effusions. 6/26: CT chest w/ Contrast>>1. Extensive areas of consolidated lung, some of which is  likely related to some passive atelectatic change. Areas of hypoattenuating lung parenchyma within these regions of consolidation further supported diagnosis of pneumonia. 2. There is an area of cavitation right upper lobe associated with a larger consolidative opacity, could reflect a necrotic pneumonia. 3. Moderate right and moderate to large left pleural effusion. Possibly parapneumonic and/or related to patient's volume status however no convincing CT evidence of empyema at this time. 4. Stable numerous scattered mediastinal, hilar and bilateral axillary lymph nodes, some of which appear low-attenuation, likely combination edematous and reactive. 5. Interval development of additional paraseptal emphysematous/bullous changes in the bilateral upper lobes since prior CT. 6. Left IJ approach dual lumen dialysis catheter tip terminates at the of the right atrium. A small amount of gas along the catheter tract likely compatible with the recent placement of this access. 7. Cardiomegaly right heart enlargement, pulmonary artery enlargement and reflux of contrast into the IVC. Findings could suggest pulmonary hypertension and elevated right heart pressure/right heart failure. 8. Additional features of anasarca/volume overload with diffuse body wall and mediastinal edematous changes. Periportal edema could suggest some hepatic congestion in the setting of right heart failure. 9. Mild gallbladder wall thickening, nonspecific possibly related to motion or the hepatic congestion. Correlate with right upper quadrant symptoms and consider right upper quadrant ultrasound if clinically warranted 6/27: 2D Echocardiogram>> 7/2: Diffuse prominent bilateral pulmonary infiltrates/edema again noted. Cavitary lesion right upper lung again noted. Progression of left-sided pleural effusion. 7/4: CT Head>>Negative noncontrast head CT. 7/4: CTA Chest>>1. No central pulmonary embolus. Motion artifact as  well as consolidative lung limits assessment. 2. Large left pleural effusion has progressed from prior exam. Complete compressive atelectasis and/or consolidation of the left lower lobe and partial atelectasis of the left upper lobe. Small right pleural effusion  is similar. 3. Probable cavitary process in the right mid lung versus loculated air and fluid in the minor fissure. This is unchanged from recent exam. 4. Generalized body wall edema. Dilated IVC likely passive congestion. 5. Recent but not acute anterior rib fractures. 6. Mediastinal and left greater than right axillary adenopathy, grossly similar allowing for motion.  CULTURES: SARS-CoV-2 PCR 6/26>> Negative MRSA PCR 6/27> Negative C-Diff 6/27> Positive Blood culture x2 6/26>>No growth x 5 days Strep pneumo urinary antigen 6/26>> Legionella urinary antigen 6/26>> Negative   ANTIBIOTICS: Vancomycin 6/26>> stopped Zosyn 6/26>>  HISTORY OF PRESENT ILLNESS:   Shawn [name] is a 34 year old male with a past medical history significant for ESRD on hemodialysis, hypertension, GERD, diabetes mellitus, IBS, osteomyelitis who presents to [facility] ED on 12/29/2019 due to acute respiratory distress and altered mental status.  Apparently patient's family reported that he had an episode of unresponsiveness with questionable convulsions.  Upon EMS arrival he was noted to be hypoxic with O2 saturations in the 70s of which he was placed on nonrebreather mask.  The patient did miss his scheduled hemodialysis session earlier today due to an episode of diarrhea.  He also had a dialysis catheter placed yesterday to his left chest.  Upon presentation to the ED he remained short of breath and hypoxic.  He was afebrile, respiratory rate in the 20s, normal sinus rhythm, blood pressure 116/83, and 95% O2 saturations on nonrebreather.  Initial work-up revealed BUN 66, creatinine 6.07, BNP 3473, high-sensitivity troponin 20, WBC 5.5, hemoglobin 10.8.  CT  Chest revealed extensive areas of consolidation with right upper lobe cavitation concerning for questionable necrotizing pneumonia.  Also noted to have cardiomegaly and moderate right and left pleural effusions.  His COVID-19 PCR is negative.  Given his work of breathing and hypoxia he was subsequently placed on BiPAP.  PCCM is asked to admit the patient to stepdown unit for further work-up and treatment of acute hypoxic respiratory failure secondary to pulmonary edema/volume overload and questionable necrotizing pneumonia.  Nephrology has been consulted for urgent hemodialysis for volume removal.  On 01/06/20 while on the Med-surg unit, he was found by nursing staff unresponsive, hypoxic (O2 saturations 50s to 60s),  with agonal respirations.  He was found to have a faint palpable pulse.  CODE BLUE was called, of which he was successfully intubated by ED provider.  Post intubation he was transferred to ICU.  CT head is negative, and CTA chest is negative for PE, however does show a progressive large left pleural effusion along with complete compressive atelectasis versus consolidation of the left lower lobe and partial atelectasis of the left upper lobe.  PAST MEDICAL HISTORY :   has a past medical history of CKD (chronic kidney disease), Diabetes mellitus without complication ([place]), GERD (gastroesophageal reflux disease), HTN (hypertension), IBS (irritable bowel syndrome), and Osteomyelitis ([place]).  has a past surgical history that includes Irrigation and debridement foot (Right, 12/20/2017); Irrigation and debridement foot (Right, 12/24/2017); Amputation (Right, 12/24/2017); Application if wound vac (Right, 12/24/2017); ABDOMINAL AORTOGRAM W/LOWER EXTREMITY (Right, 12/23/2017); Irrigation and debridement foot (Right, 01/01/2018); Application if wound vac (Right, 01/01/2018); Irrigation and debridement foot (Left, 04/06/2018); Amputation (Left, 04/06/2018); Lower Extremity Angiography (Left, 04/06/2018); Amputation  (Left, 04/09/2018); Achilles tendon surgery (Bilateral, 06/16/2018); Bone excision (Bilateral, 06/16/2018); Irrigation and debridement foot (Right, 09/20/2018); Lower Extremity Angiography (Bilateral, 10/13/2018); and Amputation (Right, 10/18/2018).   Prior to Admission medications   Medication Sig Start Date End Date Taking? Authorizing Provider  ALPRAZolam Duanne Moron) 0.25 MG tablet  Take 1 tablet (0.25 mg total) by mouth 3 (three) times daily as needed for anxiety. 08/18/18   Gladstone Lighter, MD  amoxicillin-clavulanate (AUGMENTIN) 875-125 MG tablet Take 1 tablet by mouth 2 (two) times daily. Patient not taking: Reported on 10/11/2018 09/21/18   Bettey Costa, MD  calcium carbonate (CALCIUM 600) 600 MG TABS tablet Take 1 tablet (600 mg total) by mouth 2 (two) times daily with a meal for 30 days. 09/13/18 10/13/18  Milinda Pointer, MD  diphenoxylate-atropine (LOMOTIL) 2.5-0.025 MG tablet Take 1 tablet by mouth 4 (four) times daily as needed for diarrhea or loose stools.    [provider]  ferrous sulfate 325 (65 FE) MG tablet Take 1 tablet (325 mg total) by mouth 2 (two) times daily with a meal. 01/03/18   Sainani, Belia Heman, MD  gabapentin (NEURONTIN) 300 MG capsule Take 1 capsule (300 mg total) by mouth 2 (two) times daily. 09/21/18   Bettey Costa, MD  insulin aspart (NOVOLOG) 100 UNIT/ML injection Inject 4 Units into the skin 3 (three) times daily with meals. 08/18/18   Gladstone Lighter, MD  insulin glargine (LANTUS) 100 UNIT/ML injection Inject 0.3 mLs (30 Units total) into the skin daily. Patient taking differently: Inject 35 Units into the skin 2 (two) times daily.  08/18/18   Gladstone Lighter, MD  Insulin Syringes, Disposable, U-100 0.3 ML MISC 1 Syringe by Does not apply route 4 (four) times daily -  with meals and at bedtime. 12/27/17   Loletha Grayer, MD  levofloxacin (LEVAQUIN) 750 MG tablet Take 1 tablet (750 mg total) by mouth daily. Patient not taking: Reported on 10/11/2018 09/24/18    Carrie Mew, MD  promethazine (PHENERGAN) 25 MG tablet Take 1 tablet (25 mg total) by mouth every 6 (six) hours as needed for nausea or vomiting. 08/18/18   Gladstone Lighter, MD  vitamin C (VITAMIN C) 250 MG tablet Take 1 tablet (250 mg total) by mouth 2 (two) times daily. 06/20/18   Gladstone Lighter, MD  zolpidem (AMBIEN) 10 MG tablet Take 10 mg by mouth at bedtime.     [provider]   Scheduled Meds: . amLODipine  10 mg Oral Daily  . ascorbic acid  250 mg Oral BID  . calcium-vitamin D  1 tablet Oral BID WC  . carvedilol  25 mg Oral BID WC  . Chlorhexidine Gluconate Cloth  6 each Topical Daily  . collagenase   Topical Daily  . docusate  100 mg Oral BID  . epoetin (EPOGEN/PROCRIT) injection  4,000 Units Intravenous Q T,Th,Sa-HD  . ferrous sulfate  325 mg Oral BID WC  . folic acid  1 mg Oral Daily  . gabapentin  300 mg Oral BID  . hydrALAZINE  25 mg Oral Q8H  . insulin aspart  0-6 Units Subcutaneous Q4H  . irbesartan  300 mg Oral Daily  . levETIRAcetam  500 mg Oral BID  . metoCLOPramide  5 mg Oral TID AC  . mirtazapine  30 mg Oral QHS  . pantoprazole  40 mg Oral Daily  . polyethylene glycol  17 g Oral Daily  . QUEtiapine  25 mg Oral QHS  . sevelamer carbonate  800 mg Oral TID WC   Continuous Infusions: . sodium chloride Stopped (01/05/20 0541)  . sodium chloride    . dextrose 30 mL/hr at 01/08/20 0600  . famotidine (PEPCID) IV 20 mg (01/08/20 7035)  . lacosamide (VIMPAT) IV Stopped (01/07/20 2313)  . magnesium sulfate bolus IVPB    .  piperacillin-tazobactam (ZOSYN)  IV 2.25 g (01/08/20 0601)   PRN Meds:.sodium chloride, sodium chloride, acetaminophen, alteplase, heparin, hydrALAZINE, ipratropium-albuterol, lidocaine (PF), lidocaine-prilocaine, loperamide, ondansetron (ZOFRAN) IV, oxyCODONE, pentafluoroprop-tetrafluoroeth, polyethylene glycol   Allergies  Allergen Reactions  . Banana Hives, Nausea And Vomiting and Rash  . Keflex [Cephalexin] Rash    No  swelling- also taken penicillin without any issue.  . Sulfa Antibiotics Anaphylaxis  . Grapeseed Extract [Nutritional Supplements] Itching  . Shellfish Allergy Hives    "ALL SEAFOOD"  . Grape Seed Rash    FAMILY HISTORY:  family history includes Diabetes in his mother; Healthy in his father; Ovarian cancer in his mother. SOCIAL HISTORY:  reports that he has never smoked. He has never used smokeless tobacco. He reports current drug use. Drugs: Marijuana and PCP. He reports that he does not drink alcohol.  REVIEW OF SYSTEMS: Unable to assess due to intubation and sedation  SUBJECTIVE:  Unable to assess due to intubation and sedation  VITAL SIGNS: Temp:  [98.5 F (36.9 C)-98.7 F (37.1 C)] 98.7 F (37.1 C) (07/05 2000) Pulse Rate:  [58-74] 60 (07/06 0600) Resp:  [16-20] 16 (07/05 1800) BP: (124-169)/(76-102) 148/92 (07/06 0600) SpO2:  [93 %-100 %] 99 % (07/06 0600) FiO2 (%):  [28 %] 28 % (07/05 1513) Weight:  [64.6 kg] 64.6 kg (07/06 0411)  PHYSICAL EXAMINATION: General: Acutely ill-appearing male, laying in bed, intubated and sedated, Neuro: Sedated on MV GCS5T HEENT: Atraumatic, normocephalic, neck supple, no JVD, pupils PERRLA, ET tube in place Cardiovascular: Regular rate and rhythm, S1-S2, no murmurs, rubs, gallops Lungs: Coarse crackles to auscultation bilaterally, vent assisted, even, nonlabored-  Abdomen: Soft, nontender, nondistended, no guarding or rebound tenderness, bowel sounds positive x4 Musculoskeletal: Right BKA, no edema Skin: Warm and mildly diaphoretic.  Stage III sacral decubitus and right hip skin tear.    Recent Labs  Lab 01/06/20 0003 01/07/20 0435 01/08/20 0538  NA 133* 131* 125*  K 4.1 4.0 4.2  CL 95* 95* 89*  CO2 26 22 22   BUN 27* 34* 38*  CREATININE 3.19* 4.02* 4.80*  GLUCOSE 145* 93 153*   Recent Labs  Lab 01/06/20 0003 01/07/20 0435 01/08/20 0538  HGB 8.3* 9.8* 9.5*  HCT 28.5* 29.4* 29.5*  WBC 4.3 4.1 4.9  PLT 137* 151 146*    DG Abd 1 View  Result Date: 01/06/2020 CLINICAL DATA:  Confirm orogastric tube placement. EXAM: ABDOMEN - 1 VIEW COMPARISON:  10/26/2018 FINDINGS: Orogastric tube has been placed, tip overlying the level of the mid stomach. Small bore pigtail catheter overlies the LEFT lung base. Bowel gas pattern is nonobstructive. IMPRESSION: Orogastric tube tip overlying the level of the mid stomach. Electronically Signed   By: Nolon Nations M.D.   On: 01/06/2020 20:46   DG Chest Port 1 View  Result Date: 01/07/2020 CLINICAL DATA:  34 year old male with history of respiratory failure. EXAM: PORTABLE CHEST 1 VIEW COMPARISON:  Chest x-ray 01/06/2020. FINDINGS: An endotracheal tube is in place with tip 5.5 cm above the carina. Left-sided internal jugular PermCath with tips terminating in the right atrium and at the superior cavoatrial junction. Small bore left-sided chest tube with pigtail reformed in the base of the left hemithorax. A nasogastric tube is seen extending into the stomach, however, the tip of the nasogastric tube extends below the lower margin of the image. Lung volumes are slightly low. Patchy areas of interstitial prominence an ill-defined airspace disease in the lungs bilaterally, most evident in the right mid lung and  in the apex of the left upper lobe concerning for multilobar pneumonia. No pleural effusions. No definite pneumothorax. No evidence of pulmonary edema. Heart size is upper limits of normal. Upper mediastinal contours are within normal limits. IMPRESSION: 1. Support apparatus, as above. 2. Multilobar bilateral pneumonia redemonstrated, as above. Electronically Signed   By: Vinnie Langton M.D.   On: 01/07/2020 12:00   DG Chest Port 1 View  Result Date: 01/06/2020 CLINICAL DATA:  LEFT chest tube placement.  Code last night. EXAM: PORTABLE CHEST 1 VIEW COMPARISON:  01/06/2020 at 1:30 a.m. FINDINGS: Patient has LEFT-sided dialysis catheter with tips overlying the superior vena cava.  Endotracheal tube is in place, tip 5.1 centimeters above the carina. Interval placement of LEFT-sided chest tube. LEFT pleural effusion is significantly decreased. No pneumothorax. There has been slight improvement in aeration of the lungs bilaterally. There are persistent bilateral patchy opacities. IMPRESSION: 1. Interval placement of LEFT-sided chest tube. 2. Improved aeration of the lungs bilaterally. Electronically Signed   By: Nolon Nations M.D.   On: 01/06/2020 15:20     ASSESSMENT / PLAN:  Severe acute hypoxemic respiratory failure  -Respiratory Arrest on 01/06/20 due to Large left pleural effusion and complete compressive atelectasis of LLL -recent hx Necrotizing Pneumonia -liberated from MV 01/07/20 -chest tube removed 01/07/20 -Hemodialysis for volume removal    Possible persistent Necrotizing Pneumonia -Monitor fever curve -Trend WBCs and procalcitonin-within referece range.  -Follow cultures as above -tracheal aspirate- negative while at [facility]- previosly CRE -Zosyn 9 days completed -plan for 10d   Moderate protein-calorie malnutrition Dietary consultation Bitemporal and peripheral muscle wasting   Hyponatremia  -euvolemic post D10W  -will d/c IVF and start salt tabs x 1 day tid  Seizure Activity - Likely breakthrough seizures due to non-compliant with seizure meds in addition to being on medication that could potentially lower seizure threshold (Levaquin + Zosyn) -Continue home Keppra and Vimpat -Seizure precautions -As needed Ativan for breakthrough seizure -Neurology following, appreciate input   ESRD on HD (T,Th,S) -Monitor I&O's / urinary output -Follow BMP -Ensure adequate renal perfusion -Avoid nephrotoxic agents as able -Replace electrolytes as indicated -Nephrology following, appreciate input -Hemodialysis as per nephrology   Anemia of Chronic Disease -Monitor for S/Sx of bleeding -Trend CBC -SQ Heparin for VTE Prophylaxis  -Transfuse for Hgb  <7  Diabetes Mellitus -CBGs -SSI -Follow ICU hypo/hyperglycemia protocol   Persistent Nausea and vomiting TZ:[id number] Gastroparesis -prn zofran -avoid phenergan--makes him groggy -Reglan IV 5 mg tid  Unstageable coccyx wound, Present on admission -Turn and reposition every two hours -Wound specialist following with dressing changes daily.     BEST PRACTICES DISPOSITION: ICU GOALS OF CARE: Full Code VTE PROPHYLAXIS: Heparin SubQ STRESS ULCER PROPHYLAXIS: IV Pepcid CONSULTS: Nephrology, Neurology, Wound UPDATES: No family at bedside for update during NP rounds 01/06/20    Critical care provider statement:    Critical care time (minutes):  33   Critical care time was exclusive of:  Separately billable procedures and  treating other patients   Critical care was necessary to treat or prevent imminent or  life-threatening deterioration of the following conditions:  Aucute hypoxemic respirator failure, ESRD, fluid overload, large pleural effusion, multiple comorbid conditions.    Critical care was time spent personally by me on the following  activities:  Development of treatment plan with patient or surrogate,  discussions with consultants, evaluation of patient's response to  treatment, examination of patient, obtaining history from patient or  surrogate, ordering and performing treatments and  interventions, ordering  and review of laboratory studies and re-evaluation of patient's condition   I assumed direction of critical care for this patient from another  provider in my specialty: no      Ottie Glazier, M.D.  [facility] [place]    01/08/2020, 8:26 AM

## 2020-01-08 NOTE — Progress Notes (Signed)
Nutrition Follow-up  DOCUMENTATION CODES:   Not applicable  INTERVENTION:  Patient reports he is eating well and does not want any oral nutrition supplements at this time.  Encouraged adequate intake of calories and protein from meals.  Provide Rena-vite QHS.  NUTRITION DIAGNOSIS:   Severe Malnutrition related to chronic illness (DM, ESRD on HD) as evidenced by mild fat depletion, moderate fat depletion, moderate muscle depletion, severe muscle depletion.  Ongoing.  GOAL:   Patient will meet greater than or equal to 90% of their needs  Progressing.  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  Patient was extubated yesterday. Left chest tube was removed. Diet was advanced to renal with 1.2 L fluid restriction yesterday. Met with patient at bedside. He reports he has a good appetite. He reports he also had a good appetite and intake PTA. Patient reports he is eating very well and does not want any oral nutrition supplements at this time.  Discussed with RN. Patient was put on regular diet with 1.5 L fluid restriction this morning per patient's request. Patient ordered 3 breakfast trays this morning.  Medications reviewed and include: vitamin C 250 mg BID, Oscal with D 1 tablet BID, Epogen 4000 units with HD, ferrous sulfate 591 mg BID, folic acid 1 mg daily, gabapentin, Novolog 0-6 units Q4hrs, Keppra, Reglan 5 mg TID before meals, Remeron 30 mg QHS, Protonix, Renvela 800 mg TID with meals, famotidine, Zosyn.  Labs reviewed: CBG 130-174, Sodium 125, Chloride 89, BUN 38, Creatinine 4.8, Phosphorus 5.4, Magnesium  1.5.  I/O: pt anuric  Weight trend: 64.6 kg on 7/6; +11.6 kg from 6/27  Diet Order:   Diet Order            Diet regular Room service appropriate? Yes with Assist; Fluid consistency: Thin; Fluid restriction: 1500 mL Fluid  Diet effective now                EDUCATION NEEDS:   No education needs have been identified at this time  Skin:  Skin Assessment: Skin Integrity Issues: Skin Integrity Issues:: Stage II, Stage III Stage II: right hip (1.8cm x 2cm) Stage III: sacrum (2.5cm x 0.5cm x 0.5cm)  Last BM:  01/08/2020 - small type 7  Height:   Ht Readings from Last 1 Encounters:  01/06/20 5' 5.98" (1.676 m)   Weight:   Wt Readings from Last 1 Encounters:  01/08/20 64.6 kg   Ideal Body Weight:  60.7 kg (adjusted for BKA)  BMI:  Body mass index is 23 kg/m.  Estimated Nutritional Needs:   Kcal:  1900-2100  Protein:  95-105g/day  Fluid:  UOP +1L  Jacklynn Barnacle, MS, RD, LDN Pager number available on Amion

## 2020-01-08 NOTE — Progress Notes (Signed)
Pt is done with dialysis, pt transferred to room 224.

## 2020-01-08 NOTE — Progress Notes (Signed)
Shawn [name] was not ready in the moment to work on advanced directive. Advanced directive briefly explained and will be handed off to chaplain on 7.7 to continue conversation.

## 2020-01-08 NOTE — Progress Notes (Addendum)
Daily Progress Note   Patient Name: Shawn [name]       Date: 01/08/2020 DOB: 1986-01-16  Age: 34 y.o. MRN#: [id number] Attending Physician: Shawn Glazier, MD Primary Care Physician: Shawn Castle, MD Admit Date: 12/29/2019  Reason for Consultation/Follow-up: Establishing goals of care  Subjective: Patient is sitting in bed. We discussed the critical events that occured since out last meeting Thursday. Shawn [name] states if he were unable to make decisions, he would want his [name] Shawn [name] to be his Media planner. He states when he was born, he took his mother's last name at that time, and now she is married and has a different last name.  Patient states his 71 year old son is visiting with his own mother right now, but lives with Shawn [name] Shawn [name] full time. Shawn [name] states prior to his hospitalization, he lived with Shawn [name] as well.   He states he does not want to frequently be in hospitals, but does not want to die either.  He states he would want to be placed back on a ventilator by ETT and converted to trach, but would not want to live on a ventilator indefinitely. He does not know how long he would be willing to live on a vent. He does not know how frequent of admissions or what amount of time in a hospital would create an unacceptable QOL. He states his [name] would know what to do if he were unable to make decisions. He would like all indicated care at this time but will continue to consider his wishes.   Length of Stay: 10  Current Medications: Scheduled Meds:  . amLODipine  10 mg Oral Daily  . ascorbic acid  250 mg Oral BID  . calcium-vitamin D  1 tablet Oral BID WC  . carvedilol  25 mg Oral BID WC  . Chlorhexidine Gluconate Cloth  6 each Topical Daily  . collagenase    Topical Daily  . epoetin (EPOGEN/PROCRIT) injection  4,000 Units Intravenous Q T,Th,Sa-HD  . ferrous sulfate  325 mg Oral BID WC  . folic acid  1 mg Oral Daily  . gabapentin  300 mg Oral BID  . hydrALAZINE  25 mg Oral Q8H  . insulin aspart  0-6 Units Subcutaneous Q4H  . irbesartan  300 mg Oral Daily  . levETIRAcetam  500 mg Oral BID  . metoCLOPramide  5 mg Oral TID AC  . mirtazapine  30 mg Oral QHS  . pantoprazole  40 mg Oral Daily  . QUEtiapine  25 mg Oral QHS  . sevelamer carbonate  800 mg Oral TID WC  . sodium chloride  2 g Oral TID WC    Continuous Infusions: . dextrose 30 mL/hr at 01/08/20 0800  . famotidine (PEPCID) IV Stopped (01/08/20 0705)  . lacosamide (VIMPAT) IV 100 mg (01/08/20 0951)  . magnesium sulfate bolus IVPB 4 g (01/08/20 0950)  . piperacillin-tazobactam (ZOSYN)  IV Stopped (01/08/20 0631)    PRN Meds: acetaminophen, alteplase, heparin, hydrALAZINE, ipratropium-albuterol, lidocaine (PF), lidocaine-prilocaine, loperamide, ondansetron (ZOFRAN) IV, oxyCODONE, pentafluoroprop-tetrafluoroeth, polyethylene glycol  Physical Exam Pulmonary:     Effort: Pulmonary effort is normal.  Neurological:     Mental Status: He is alert.             Vital Signs: BP (!) 161/129 (BP Location: Right Arm)   Pulse 63   Temp 97.9 F (36.6 C) (Oral)   Resp 16   Ht 5' 5.98" (1.676 m)   Wt 64.6 kg   SpO2 98%   BMI 23.00 kg/m  SpO2: SpO2: 98 % O2 Device: O2 Device: Nasal Cannula O2 Flow Rate: O2 Flow Rate (L/min): 1 L/min  Intake/output summary:   Intake/Output Summary (Last 24 hours) at 01/08/2020 1024 Last data filed at 01/08/2020 0800 Gross per 24 hour  Intake 952.7 ml  Output 45 ml  Net 907.7 ml   LBM: Last BM Date: 01/07/20 Baseline Weight: Weight: 52.2 kg Most recent weight: Weight: 64.6 kg       Palliative Assessment/Data: 30%      Patient Active Problem List   Diagnosis Date Noted  . Decubitus ulcer of coccyx, unspecified pressure ulcer stage  01/01/2020  . End stage renal disease on dialysis ([place]) 01/01/2020  . Cavitary lesion of lung 01/01/2020  . Necrotizing pneumonia ([place]) 01/01/2020  . Acute respiratory failure with hypoxia ([place])   . 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 Hyperglycemia 10/11/2018  . Pressure injury of skin 09/20/2018  . Uncontrolled diabetes mellitus ([place]) 09/19/2018  . Neurogenic pain 09/13/2018  . Marijuana use 09/13/2018  . Long term prescription benzodiazepine use 09/13/2018  . Insulin-dependent diabetes mellitus with renal complications 76/19/5093  . Vitamin D deficiency 08/21/2018  . Hypocalcemia 08/21/2018  . CKD stage G3a/A1, GFR 45-59 and albumin creatinine ratio <30 mg/g 08/21/2018  . History of acute renal failure 08/21/2018  . [place] (hyperglycemic hyperosmolar nonketotic coma) ([place]) 08/16/2018  . Chronic foot pain (Primary Area of Pain) (Bilateral) (L>R) 08/14/2018  . Chronic lower extremity pain (Secondary Area of Pain) (Bilateral) (L>R) 08/14/2018  . Chronic generalized abdominal pain [facility] Area of Pain) 08/14/2018  . Chronic pain syndrome 08/14/2018  . Long term current use of opiate analgesic 08/14/2018  . Pharmacologic therapy 08/14/2018  . Disorder of skeletal system 08/14/2018  . Problems influencing health status 08/14/2018  . Intractable vomiting with nausea 07/16/2018  . IV infusion line dysfunction ([place]) 07/09/2018  . Hyperglycemic hyperosmolar nonketotic coma ([place]) 05/28/2018  . Acute renal failure (ARF) ([place]) 04/27/2018  . Left foot infection 04/05/2018  . Foot infection 02/06/2018  . Local infection of the skin and subcutaneous tissue, unspecified 02/06/2018  . Hypertension associated with diabetes ([place]) 01/16/2018  . Osteomyelitis ([place]) 01/10/2018  . Cellulitis 12/29/2017  . Sepsis ([place]) 12/18/2017  . Moderate recurrent major depression ([place]) 11/10/2017  .  Type 2 diabetes mellitus with hyperosmolar nonketotic hyperglycemia ([place])  11/09/2017  . History of migraine 07/15/2017  . IBS (irritable bowel syndrome) 07/15/2017  . Protein-calorie malnutrition, severe 07/14/2017  . Abdominal pain 07/13/2017  . Diarrhea 06/01/2013  . 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 GERD (gastroesophageal reflux disease) 12/30/2009  . Type 1 diabetes mellitus with diabetic polyneuropathy ([place]) 11/25/2009  . MDD (major depressive disorder) 11/25/2009  . Hypercholesterolemia 03/07/2008    Palliative Care Assessment & Plan    Recommendations/Plan:  Full code/full scope.   Recommend palliative at D/C.   Chaplain referral for HPOA papers for [name] then mother to be his decision maker, but not together.     Code Status:    Code Status Orders  (From admission, onward)         Start     Ordered   12/29/19 2320  Full code  Continuous        12/29/19 2320        Code Status History    Date Active Date Inactive Code Status Order ID Comments User Context   10/18/2018 1242 11/02/2018 0144 Full Code [id number]  Katha Cabal, MD Inpatient   10/11/2018 1157 10/13/2018 1654 Full Code [id number]  Hillary Bow, MD Inpatient   09/19/2018 2049 09/21/2018 1851 Full Code [id number]  Saundra Shelling, MD Inpatient   08/16/2018 1203 08/18/2018 2142 Full Code [id number]  Hillary Bow, MD ED   07/16/2018 1739 07/18/2018 0104 Full Code [id number]  Dustin Flock, MD Inpatient   07/10/2018 0111 07/11/2018 1955 Full Code [id number]  Salary, Avel Peace, MD Inpatient   06/14/2018 1744 06/20/2018 2123 Full Code [id number]  Loletha Grayer, MD ED   05/28/2018 0437 05/28/2018 1926 Full Code [id number]  Arta Silence, MD ED   04/27/2018 1726 05/02/2018 1713 Full Code [id number]  Henreitta Leber, MD Inpatient   04/05/2018 1724 04/11/2018 2142 Full Code [id number]  Dustin Flock, MD Inpatient   02/06/2018 2022 02/07/2018 1512 Full Code [id number]  Demetrios Loll, MD Inpatient   01/10/2018 2259 01/11/2018 1947 Full Code [id number]  Amelia Jo, MD Inpatient   12/30/2017  0019 01/03/2018 1457 Full Code [id number]  Harrie Foreman, MD Inpatient   12/20/2017 1541 12/27/2017 2147 Full Code [id number]  Sharlotte Alamo, [facility] Inpatient   11/10/2017 0049 11/11/2017 1632 Full Code [id number]  Amelia Jo, MD Inpatient   07/13/2017 2112 07/15/2017 1730 Full Code [id number]  Jules Husbands, MD ED   Advance Care Planning Activity       Prognosis:  Poor overall   Thank you for allowing the Palliative Medicine Team to assist in the care of this patient.   Total Time 25 min Prolonged Time Billed  no       Greater than 50%  of this time was spent counseling and coordinating care related to the above assessment and plan.  Asencion Gowda, NP  Please contact Palliative Medicine Team phone at [phone] for questions and concerns.

## 2020-01-08 NOTE — Progress Notes (Signed)
[facility]  ROUNDING NOTE   Subjective:     Extubated now Asking for extra portions of food and liberalization of diet Denies SOB   Objective:  Vital signs in last 24 hours:  Temp:  [97.9 F (36.6 C)-98.7 F (37.1 C)] 97.9 F (36.6 C) (07/06 0800) Pulse Rate:  [58-74] 63 (07/06 0800) Resp:  [16-20] 16 (07/05 1800) BP: (124-169)/(76-129) 161/129 (07/06 0800) SpO2:  [93 %-100 %] 98 % (07/06 0800) FiO2 (%):  [28 %] 28 % (07/05 1513) Weight:  [64.6 kg] 64.6 kg (07/06 0411)  Weight change: 3.3 kg Filed Weights   01/06/20 0500 01/07/20 0354 01/08/20 0411  Weight: 61.4 kg 61.3 kg 64.6 kg    Intake/Output: I/O last 3 completed shifts: In: 2370.1 [I.V.:2049.8; NG/GT:150; IV Piggyback:170.3] Out: 475 [Emesis/NG output:50; Stool:100; Chest Tube:325]   Intake/Output this shift:  Total I/O In: 159.7 [I.V.:59.7; IV Piggyback:100] Out: -   Physical Exam: General:  Critically ill-appearing, laying in the bed  HEENT  ET tube, OG tube in place  Pulm/lungs  coarse breath sounds, [place] O2  CVS/Heart  no rub or gallop  Abdomen:   Soft, nontender  Extremities:  + peripheral edema  Neurologic:  A & O. Able to follow commands and answer questions  Skin:  No acute rashes  Left IJ PermCath   Basic Metabolic Panel: Recent Labs  Lab 01/02/20 0536 01/02/20 0536 01/03/20 0442 01/03/20 0442 01/05/20 0926 01/05/20 0926 01/06/20 0003 01/07/20 0435 01/08/20 0538  NA 133*   < > 133*  --  129*  --  133* 131* 125*  K 3.8   < > 4.7  --  4.4  --  4.1 4.0 4.2  CL 94*   < > 93*  --  91*  --  95* 95* 89*  CO2 26   < > 26  --  26  --  26 22 22   GLUCOSE 114*   < > 110*  --  133*  --  145* 93 153*  BUN 42*   < > 53*  --  38*  --  27* 34* 38*  CREATININE 4.75*   < > 5.66*  --  4.51*  --  3.19* 4.02* 4.80*  CALCIUM 8.3*   < > 8.5*   < > 8.2*   < > 8.4* 8.2* 8.0*  MG 1.6*  --  1.8  --   --   --  1.5*  --  1.5*  PHOS 5.4*  --  8.5*  --  5.8*  --   --  4.1 5.4*   < > = values in  this interval not displayed.    Liver Function Tests: Recent Labs  Lab 01/05/20 0926 01/06/20 0003 01/07/20 0435 01/08/20 0538  AST  --  13*  --   --   ALT  --  10  --   --   ALKPHOS  --  77  --   --   BILITOT  --  0.7  --   --   PROT  --  6.2*  --   --   ALBUMIN 2.8* 2.5* 2.3* 2.2*   No results for input(s): LIPASE, AMYLASE in the last 168 hours. No results for input(s): AMMONIA in the last 168 hours.  CBC: Recent Labs  Lab 01/03/20 0442 01/05/20 0926 01/06/20 0003 01/07/20 0435 01/08/20 0538  WBC 8.5 8.3 4.3 4.1 4.9  NEUTROABS  --   --  3.1 2.1  --   HGB 11.1* 9.3*  8.3* 9.8* 9.5*  HCT 35.6* 31.7* 28.5* 29.4* 29.5*  MCV 86.2 91.1 91.3 81.2 82.6  PLT 157 146* 137* 151 146*    Cardiac Enzymes: No results for input(s): CKTOTAL, CKMB, CKMBINDEX, TROPONINI in the last 168 hours.  BNP: Invalid input(s): POCBNP  CBG: Recent Labs  Lab 01/07/20 1548 01/07/20 1934 01/08/20 0038 01/08/20 0356 01/08/20 0753  GLUCAP 91 113* 144* 174* 130*    Microbiology: Results for orders placed or performed during the hospital encounter of 12/29/19  SARS Coronavirus 2 by RT PCR (hospital order, performed in [facility] hospital lab) Nasopharyngeal Nasopharyngeal Swab     Status: None   Collection Time: 12/29/19 10:59 PM   Specimen: Nasopharyngeal Swab  Result Value Ref Range Status   SARS Coronavirus 2 NEGATIVE NEGATIVE Final    Comment: (NOTE) SARS-CoV-2 target nucleic acids are NOT DETECTED.  The SARS-CoV-2 RNA is generally detectable in upper and lower respiratory specimens during the acute phase of infection. The lowest concentration of SARS-CoV-2 viral copies this assay can detect is 250 copies / mL. A negative result does not preclude SARS-CoV-2 infection and should not be used as the sole basis for treatment or other patient management decisions.  A negative result may occur with improper specimen collection / handling, submission of specimen other than  nasopharyngeal swab, presence of viral mutation(s) within the areas targeted by this assay, and inadequate number of viral copies (<250 copies / mL). A negative result must be combined with clinical observations, patient history, and epidemiological information.  Fact Sheet for Patients:   StrictlyIdeas.no  Fact Sheet for Healthcare Providers: BankingDealers.co.za  This test is not yet approved or  cleared by the [place] FDA and has been authorized for detection and/or diagnosis of SARS-CoV-2 by FDA under an Emergency Use Authorization (EUA).  This EUA will remain in effect (meaning this test can be used) for the duration of the COVID-19 declaration under Section 564(b)(1) of the Act, 21 U.S.C. section 360bbb-3(b)(1), unless the authorization is terminated or revoked sooner.  Performed at [facility], [address] [place], [place] [address]   Blood culture (routine x 2)     Status: None   Collection Time: 12/29/19 10:59 PM   Specimen: BLOOD  Result Value Ref Range Status   Specimen Description BLOOD RIGHT ANTECUBITAL  Final   Special Requests   Final    BOTTLES DRAWN AEROBIC AND ANAEROBIC Blood Culture adequate volume   Culture   Final    NO GROWTH 5 DAYS Performed at [facility], [address] [place], [place] [address]    Report Status 01/03/2020 FINAL  Final  MRSA PCR Screening     Status: None   Collection Time: 12/30/19 12:25 AM   Specimen: Nasopharyngeal  Result Value Ref Range Status   MRSA by PCR NEGATIVE NEGATIVE Final    Comment:        The GeneXpert MRSA Assay (FDA approved for NASAL specimens only), is one component of a comprehensive MRSA colonization surveillance program. It is not intended to diagnose MRSA infection nor to guide or monitor treatment for MRSA infections. Performed at [facility], [place], [place] [address]   C Difficile Quick  Screen w PCR reflex     Status: Abnormal   Collection Time: 12/30/19  1:50 PM   Specimen: STOOL  Result Value Ref Range Status   C Diff antigen POSITIVE (A) NEGATIVE Final   C Diff toxin NEGATIVE NEGATIVE Final  C Diff interpretation Results are indeterminate. See PCR results.  Final    Comment: Performed at [facility], [address] [place], [place] [address]  C. Diff by PCR, Reflexed     Status: Abnormal   Collection Time: 12/30/19  1:50 PM  Result Value Ref Range Status   Toxigenic C. Difficile by PCR POSITIVE (A) NEGATIVE Final    Comment: Positive for toxigenic C. difficile with little to no toxin production. Only treat if clinical presentation suggests symptomatic illness. Performed at [facility], [address], [place], [place] [address]   Body fluid culture     Status: None (Preliminary result)   Collection Time: 01/06/20  3:08 PM   Specimen: Body Fluid  Result Value Ref Range Status   Specimen Description   Final    FLUID Performed at [facility], [address] [place], [place] [address]    Special Requests   Final    NONE Performed at [facility], [address] [place], [place] [address]    Gram Stain   Final    RARE WBC PRESENT, PREDOMINANTLY MONONUCLEAR NO ORGANISMS SEEN    Culture   Final    NO GROWTH < 24 HOURS Performed at [facility], [place] [address], [place], [place] [address]    Report Status PENDING  Incomplete  Culture, respiratory (non-expectorated)     Status: None (Preliminary result)   Collection Time: 01/07/20 11:19 AM   Specimen: Tracheal Aspirate; Respiratory  Result Value Ref Range Status   Specimen Description   Final    TRACHEAL ASPIRATE Performed at [facility], [address] [place], [place] [address]    Special Requests   Final    NONE Performed at [facility], [place], [place] [address]    Gram Stain   Final    RARE WBC PRESENT,  PREDOMINANTLY PMN RARE GRAM NEGATIVE RODS    Culture   Final    FEW GRAM NEGATIVE RODS SUSCEPTIBILITIES TO FOLLOW Performed at [facility], [place] [address], [place],  [address]    Report Status PENDING  Incomplete    Coagulation Studies: No results for input(s): LABPROT, INR in the last 72 hours.  Urinalysis: No results for input(s): COLORURINE, LABSPEC, PHURINE, GLUCOSEU, HGBUR, BILIRUBINUR, KETONESUR, PROTEINUR, UROBILINOGEN, NITRITE, LEUKOCYTESUR in the last 72 hours.  Invalid input(s): APPERANCEUR    Imaging: DG Abd 1 View  Result Date: 01/06/2020 CLINICAL DATA:  Confirm orogastric tube placement. EXAM: ABDOMEN - 1 VIEW COMPARISON:  10/26/2018 FINDINGS: Orogastric tube has been placed, tip overlying the level of the mid stomach. Small bore pigtail catheter overlies the LEFT lung base. Bowel gas pattern is nonobstructive. IMPRESSION: Orogastric tube tip overlying the level of the mid stomach. Electronically Signed   By: Nolon Nations M.D.   On: 01/06/2020 20:46   DG Chest Port 1 View  Result Date: 01/08/2020 CLINICAL DATA:  Pleural effusion EXAM: PORTABLE CHEST 1 VIEW COMPARISON:  January 07, 2020 FINDINGS: Endotracheal tube has been removed. Nasogastric tube has been removed. Left chest drain has been removed. Dual lumen catheter again noted with the distal tip at the cavoatrial junction. There is no appreciable pneumothorax. There is a small right pleural effusion. There is atelectatic change in each mid lung. There is ill-defined airspace opacity in the medial left upper lobe. There is also ill-defined opacity in the right mid lung and right base. Heart is mildly enlarged with pulmonary vascularity  normal. No adenopathy. No bone lesions IMPRESSION: Ill-defined opacity concerning for focal pneumonia left upper lobe medially. Slightly less well-defined airspace opacity right mid lung and right base. Atelectatic change mid lung regions. Small right pleural effusion. Tube and  catheter positions as described without appreciable pneumothorax. Electronically Signed   By: Lowella Grip III M.D.   On: 01/08/2020 09:00   DG Chest Port 1 View  Result Date: 01/07/2020 CLINICAL DATA:  34 year old male with history of respiratory failure. EXAM: PORTABLE CHEST 1 VIEW COMPARISON:  Chest x-ray 01/06/2020. FINDINGS: An endotracheal tube is in place with tip 5.5 cm above the carina. Left-sided internal jugular PermCath with tips terminating in the right atrium and at the superior cavoatrial junction. Small bore left-sided chest tube with pigtail reformed in the base of the left hemithorax. A nasogastric tube is seen extending into the stomach, however, the tip of the nasogastric tube extends below the lower margin of the image. Lung volumes are slightly low. Patchy areas of interstitial prominence an ill-defined airspace disease in the lungs bilaterally, most evident in the right mid lung and in the apex of the left upper lobe concerning for multilobar pneumonia. No pleural effusions. No definite pneumothorax. No evidence of pulmonary edema. Heart size is upper limits of normal. Upper mediastinal contours are within normal limits. IMPRESSION: 1. Support apparatus, as above. 2. Multilobar bilateral pneumonia redemonstrated, as above. Electronically Signed   By: Vinnie Langton M.D.   On: 01/07/2020 12:00   DG Chest Port 1 View  Result Date: 01/06/2020 CLINICAL DATA:  LEFT chest tube placement.  Code last night. EXAM: PORTABLE CHEST 1 VIEW COMPARISON:  01/06/2020 at 1:30 a.m. FINDINGS: Patient has LEFT-sided dialysis catheter with tips overlying the superior vena cava. Endotracheal tube is in place, tip 5.1 centimeters above the carina. Interval placement of LEFT-sided chest tube. LEFT pleural effusion is significantly decreased. No pneumothorax. There has been slight improvement in aeration of the lungs bilaterally. There are persistent bilateral patchy opacities. IMPRESSION: 1. Interval  placement of LEFT-sided chest tube. 2. Improved aeration of the lungs bilaterally. Electronically Signed   By: Nolon Nations M.D.   On: 01/06/2020 15:20     Medications:   . dextrose 30 mL/hr at 01/08/20 0800  . famotidine (PEPCID) IV Stopped (01/08/20 0705)  . lacosamide (VIMPAT) IV Stopped (01/07/20 2313)  . magnesium sulfate bolus IVPB    . piperacillin-tazobactam (ZOSYN)  IV Stopped (01/08/20 0631)   . amLODipine  10 mg Oral Daily  . ascorbic acid  250 mg Oral BID  . calcium-vitamin D  1 tablet Oral BID WC  . carvedilol  25 mg Oral BID WC  . Chlorhexidine Gluconate Cloth  6 each Topical Daily  . collagenase   Topical Daily  . epoetin (EPOGEN/PROCRIT) injection  4,000 Units Intravenous Q T,Th,Sa-HD  . ferrous sulfate  325 mg Oral BID WC  . folic acid  1 mg Oral Daily  . gabapentin  300 mg Oral BID  . hydrALAZINE  25 mg Oral Q8H  . insulin aspart  0-6 Units Subcutaneous Q4H  . irbesartan  300 mg Oral Daily  . levETIRAcetam  500 mg Oral BID  . metoCLOPramide  5 mg Oral TID AC  . mirtazapine  30 mg Oral QHS  . pantoprazole  40 mg Oral Daily  . QUEtiapine  25 mg Oral QHS  . sevelamer carbonate  800 mg Oral TID WC  . sodium chloride  2 g Oral TID WC   acetaminophen, alteplase,  heparin, hydrALAZINE, ipratropium-albuterol, lidocaine (PF), lidocaine-prilocaine, loperamide, ondansetron (ZOFRAN) IV, oxyCODONE, pentafluoroprop-tetrafluoroeth, polyethylene glycol  Assessment/ Plan:  [name] is a 34 y.o. white male with end stage renal disease on hemodialysis, hypertension, diabetes mellitus, GERD, IBS, osteomyelitis who was admitted to [facility] on 12/29/2019 for End stage renal disease on dialysis ([place]) [N18.6, Z99.2] Acute respiratory failure with hypoxia ([place]) [J96.01]   [facility] nephrology  1. End Stage renal disease:  TTS schedule.   Routine HD was done on saturday; 1000 cc removed Next HD planned for later today  2. Anemia of CKD Lab Results   Component Value Date   HGB 9.5 (L) 01/08/2020  EPO with HD   3.  Secondary Hyperparathyroidism:  - Calcium carbonate with meals. Lab Results  Component Value Date   CALCIUM 8.0 (L) 01/08/2020   PHOS 5.4 (H) 01/08/2020    4.  Seizure disorder - had breakthrough sz. Was seen by neurologist.  - currently on Vimpat and [place].  5.  Large left pleural effusion, acute respiratory failure Chest tube placed July 4.  1700 cc removed Now with serosanguineous drainage Multifocal bilateral pneumonia seen on chest x-ray        LOS: [place] 7/6/20219:21 AM

## 2020-01-08 NOTE — Progress Notes (Signed)
[place] attempted visit per OR for AD education/completion; pt. appears to be asleep and has just begun 4hr round of dialysis per RN.  Callery will pass referral on to evening chaplain for follow-up.    01/08/20 1400  Clinical Encounter Type  Visited With Patient not available;Health care provider

## 2020-01-08 NOTE — Progress Notes (Signed)
Brief note for transfer of care:  Per PCCM:  "34 y.o. male with PMH of ESRD on HD admitted 12/29/19 due to Acute Hypoxic Respiratory Failure in the setting of Pulmonary edema/Volume Overload and questionable Necrotizing Pneumonia requiring BiPAP.  Nephrology was consulted for urgent HD. Overnight 7/2 had breakthrough seizures and transferred to stepdown, transfer back to floor on 7/3.  On 7/4 found unresponsive with respiratory arrest requiring intubation and transfer to ICU."  Patient was found to have a large left pleural effusion and complete compressive atelectasis of the LLL, chest tube was required, tube was removed on 01/07/20.  Extubated on 7/5 as well, and currently stable for transfer out of ICU.   TRH service will assume care of patient tomorrow, 7/7.

## 2020-01-09 LAB — RENAL FUNCTION PANEL
Albumin: 2.2 g/dL — ABNORMAL LOW (ref 3.5–5.0)
Anion gap: 9 (ref 5–15)
BUN: 20 mg/dL (ref 6–20)
CO2: 28 mmol/L (ref 22–32)
Calcium: 7.9 mg/dL — ABNORMAL LOW (ref 8.9–10.3)
Chloride: 102 mmol/L (ref 98–111)
Creatinine, Ser: 2.88 mg/dL — ABNORMAL HIGH (ref 0.61–1.24)
GFR calc Af Amer: 32 mL/min — ABNORMAL LOW (ref >60)
GFR calc non Af Amer: 28 mL/min — ABNORMAL LOW (ref >60)
Glucose, Bld: 161 mg/dL — ABNORMAL HIGH (ref 70–99)
Phosphorus: 3.7 mg/dL (ref 2.5–4.6)
Potassium: 3.9 mmol/L (ref 3.5–5.1)
Sodium: 139 mmol/L (ref 135–145)

## 2020-01-09 LAB — GLUCOSE, CAPILLARY
Glucose-Capillary: 110 mg/dL — ABNORMAL HIGH (ref 70–99)
Glucose-Capillary: 113 mg/dL — ABNORMAL HIGH (ref 70–99)
Glucose-Capillary: 154 mg/dL — ABNORMAL HIGH (ref 70–99)
Glucose-Capillary: 155 mg/dL — ABNORMAL HIGH (ref 70–99)
Glucose-Capillary: 156 mg/dL — ABNORMAL HIGH (ref 70–99)
Glucose-Capillary: 180 mg/dL — ABNORMAL HIGH (ref 70–99)
Glucose-Capillary: 194 mg/dL — ABNORMAL HIGH (ref 70–99)

## 2020-01-09 LAB — CBC
HCT: 28.7 % — ABNORMAL LOW (ref 39.0–52.0)
Hemoglobin: 8.8 g/dL — ABNORMAL LOW (ref 13.0–17.0)
MCH: 26.4 pg (ref 26.0–34.0)
MCHC: 30.7 g/dL (ref 30.0–36.0)
MCV: 86.2 fL (ref 80.0–100.0)
Platelets: 144 10*3/uL — ABNORMAL LOW (ref 150–400)
RBC: 3.33 MIL/uL — ABNORMAL LOW (ref 4.22–5.81)
RDW: 17 % — ABNORMAL HIGH (ref 11.5–15.5)
WBC: 5.1 10*3/uL (ref 4.0–10.5)
nRBC: 0 % (ref 0.0–0.2)

## 2020-01-09 LAB — BODY FLUID CULTURE: Culture: NO GROWTH

## 2020-01-09 LAB — ACID FAST SMEAR (AFB, MYCOBACTERIA): Acid Fast Smear: NEGATIVE

## 2020-01-09 LAB — MAGNESIUM: Magnesium: 2 mg/dL (ref 1.7–2.4)

## 2020-01-09 LAB — CYTOLOGY - NON PAP

## 2020-01-09 MED ORDER — ALPRAZOLAM 0.25 MG PO TABS
0.2500 mg | ORAL_TABLET | Freq: Every evening | ORAL | Status: DC | PRN
  Administered 2020-01-09 – 2020-01-10 (×2): 0.25 mg via ORAL
  Filled 2020-01-09 (×2): qty 1

## 2020-01-09 MED ORDER — LACOSAMIDE 50 MG PO TABS
100.0000 mg | ORAL_TABLET | Freq: Two times a day (BID) | ORAL | Status: DC
  Administered 2020-01-09 – 2020-01-12 (×7): 100 mg via ORAL
  Filled 2020-01-09 (×7): qty 2

## 2020-01-09 MED ORDER — FAMOTIDINE 20 MG PO TABS
20.0000 mg | ORAL_TABLET | Freq: Every day | ORAL | Status: DC
  Administered 2020-01-10 – 2020-01-12 (×3): 20 mg via ORAL
  Filled 2020-01-09 (×3): qty 1

## 2020-01-09 MED ORDER — VANCOMYCIN 50 MG/ML ORAL SOLUTION
125.0000 mg | Freq: Four times a day (QID) | ORAL | Status: DC
  Administered 2020-01-09 – 2020-01-12 (×12): 125 mg via ORAL
  Filled 2020-01-09 (×15): qty 2.5

## 2020-01-09 MED ORDER — CALCIUM POLYCARBOPHIL 625 MG PO TABS
625.0000 mg | ORAL_TABLET | Freq: Two times a day (BID) | ORAL | Status: DC
  Filled 2020-01-09: qty 1

## 2020-01-09 NOTE — Progress Notes (Signed)
[facility]  ROUNDING NOTE   Subjective:     No acute complaints No acute events reported by nursing staff   Objective:  Vital signs in last 24 hours:  Temp:  [97.8 F (36.6 C)-98.8 F (37.1 C)] 98.3 F (36.8 C) (07/07 1156) Pulse Rate:  [59-97] 78 (07/07 1156) Resp:  [18] 18 (07/06 1807) BP: (110-152)/(73-93) 152/90 (07/07 1156) SpO2:  [88 %-98 %] 94 % (07/07 1156)  Weight change:  Filed Weights   01/06/20 0500 01/07/20 0354 01/08/20 0411  Weight: 61.4 kg 61.3 kg 64.6 kg    Intake/Output: I/O last 3 completed shifts: In: 712.5 [I.V.:441.5; IV Piggyback:271] Out: 983 [Other:983]   Intake/Output this shift:  Total I/O In: 240 [P.O.:240] Out: -   Physical Exam: General:  Critically ill-appearing, laying in the bed  HEENT  moist oral mucous membranes  Pulm/lungs  coarse breath sounds, [place] O2  CVS/Heart  no rub or gallop  Abdomen:   Soft, nontender  Extremities:  + peripheral edema  Neurologic:  Able to follow commands and answer questions  Skin:  No acute rashes  Left IJ PermCath   Basic Metabolic Panel: Recent Labs  Lab 01/03/20 0442 01/03/20 0442 01/05/20 0926 01/05/20 0926 01/06/20 0003 01/06/20 0003 01/07/20 0435 01/08/20 0538 01/09/20 0415  NA 133*   < > 129*  --  133*  --  131* 125* 139  K 4.7   < > 4.4  --  4.1  --  4.0 4.2 3.9  CL 93*   < > 91*  --  95*  --  95* 89* 102  CO2 26   < > 26  --  26  --  22 22 28   GLUCOSE 110*   < > 133*  --  145*  --  93 153* 161*  BUN 53*   < > 38*  --  27*  --  34* 38* 20  CREATININE 5.66*   < > 4.51*  --  3.19*  --  4.02* 4.80* 2.88*  CALCIUM 8.5*   < > 8.2*   < > 8.4*   < > 8.2* 8.0* 7.9*  MG 1.8  --   --   --  1.5*  --   --  1.5* 2.0  PHOS 8.5*  --  5.8*  --   --   --  4.1 5.4* 3.7   < > = values in this interval not displayed.    Liver Function Tests: Recent Labs  Lab 01/05/20 0926 01/06/20 0003 01/07/20 0435 01/08/20 0538 01/09/20 0415  AST  --  13*  --   --   --   ALT  --  10  --    --   --   ALKPHOS  --  77  --   --   --   BILITOT  --  0.7  --   --   --   PROT  --  6.2*  --   --   --   ALBUMIN 2.8* 2.5* 2.3* 2.2* 2.2*   No results for input(s): LIPASE, AMYLASE in the last 168 hours. No results for input(s): AMMONIA in the last 168 hours.  CBC: Recent Labs  Lab 01/05/20 0926 01/06/20 0003 01/07/20 0435 01/08/20 0538 01/09/20 0415  WBC 8.3 4.3 4.1 4.9 5.1  NEUTROABS  --  3.1 2.1  --   --   HGB 9.3* 8.3* 9.8* 9.5* 8.8*  HCT 31.7* 28.5* 29.4* 29.5* 28.7*  MCV 91.1 91.3 81.2 82.6  86.2  PLT 146* 137* 151 146* 144*    Cardiac Enzymes: No results for input(s): CKTOTAL, CKMB, CKMBINDEX, TROPONINI in the last 168 hours.  BNP: Invalid input(s): POCBNP  CBG: Recent Labs  Lab 01/08/20 2213 01/08/20 2358 01/09/20 0430 01/09/20 0754 01/09/20 1154  GLUCAP 174* 194* 154* 113* 110*    Microbiology: Results for orders placed or performed during the hospital encounter of 12/29/19  SARS Coronavirus 2 by RT PCR (hospital order, performed in [facility] hospital lab) Nasopharyngeal Nasopharyngeal Swab     Status: None   Collection Time: 12/29/19 10:59 PM   Specimen: Nasopharyngeal Swab  Result Value Ref Range Status   SARS Coronavirus 2 NEGATIVE NEGATIVE Final    Comment: (NOTE) SARS-CoV-2 target nucleic acids are NOT DETECTED.  The SARS-CoV-2 RNA is generally detectable in upper and lower respiratory specimens during the acute phase of infection. The lowest concentration of SARS-CoV-2 viral copies this assay can detect is 250 copies / mL. A negative result does not preclude SARS-CoV-2 infection and should not be used as the sole basis for treatment or other patient management decisions.  A negative result may occur with improper specimen collection / handling, submission of specimen other than nasopharyngeal swab, presence of viral mutation(s) within the areas targeted by this assay, and inadequate number of viral copies (<250 copies / mL). A negative  result must be combined with clinical observations, patient history, and epidemiological information.  Fact Sheet for Patients:   StrictlyIdeas.no  Fact Sheet for Healthcare Providers: BankingDealers.co.za  This test is not yet approved or  cleared by the [place] FDA and has been authorized for detection and/or diagnosis of SARS-CoV-2 by FDA under an Emergency Use Authorization (EUA).  This EUA will remain in effect (meaning this test can be used) for the duration of the COVID-19 declaration under Section 564(b)(1) of the Act, 21 U.S.C. section 360bbb-3(b)(1), unless the authorization is terminated or revoked sooner.  Performed at [facility], [address] [place], [place] [address]   Blood culture (routine x 2)     Status: None   Collection Time: 12/29/19 10:59 PM   Specimen: BLOOD  Result Value Ref Range Status   Specimen Description BLOOD RIGHT ANTECUBITAL  Final   Special Requests   Final    BOTTLES DRAWN AEROBIC AND ANAEROBIC Blood Culture adequate volume   Culture   Final    NO GROWTH 5 DAYS Performed at [facility], [address] [place], [place] [address]    Report Status 01/03/2020 FINAL  Final  MRSA PCR Screening     Status: None   Collection Time: 12/30/19 12:25 AM   Specimen: Nasopharyngeal  Result Value Ref Range Status   MRSA by PCR NEGATIVE NEGATIVE Final    Comment:        The GeneXpert MRSA Assay (FDA approved for NASAL specimens only), is one component of a comprehensive MRSA colonization surveillance program. It is not intended to diagnose MRSA infection nor to guide or monitor treatment for MRSA infections. Performed at [facility], [place], [place] [address]   C Difficile Quick Screen w PCR reflex     Status: Abnormal   Collection Time: 12/30/19  1:50 PM   Specimen: STOOL  Result Value Ref Range Status   C Diff antigen POSITIVE (A)  NEGATIVE Final   C Diff toxin NEGATIVE NEGATIVE Final   C Diff interpretation Results are indeterminate. See PCR results.  Final    Comment:  Performed at [facility], [address] [place], [place] [address]  C. Diff by PCR, Reflexed     Status: Abnormal   Collection Time: 12/30/19  1:50 PM  Result Value Ref Range Status   Toxigenic C. Difficile by PCR POSITIVE (A) NEGATIVE Final    Comment: Positive for toxigenic C. difficile with little to no toxin production. Only treat if clinical presentation suggests symptomatic illness. Performed at [facility], [address], [place], [place] [address]   Body fluid culture     Status: None   Collection Time: 01/06/20  3:08 PM   Specimen: Body Fluid  Result Value Ref Range Status   Specimen Description   Final    FLUID Performed at [facility], [address] [place], [place] [address]    Special Requests   Final    NONE Performed at [facility], [address] [place], [place] [address]    Gram Stain   Final    RARE WBC PRESENT, PREDOMINANTLY MONONUCLEAR NO ORGANISMS SEEN    Culture   Final    NO GROWTH 3 DAYS Performed at [facility], [place] [address] [place], [place] [address]    Report Status 01/09/2020 FINAL  Final  Culture, respiratory (non-expectorated)     Status: None (Preliminary result)   Collection Time: 01/07/20 11:19 AM   Specimen: Tracheal Aspirate; Respiratory  Result Value Ref Range Status   Specimen Description   Final    TRACHEAL ASPIRATE Performed at [facility], [address] [place], [place] [address]    Special Requests   Final    NONE Performed at [facility], [address] [place],  [address]    Gram Stain   Final    RARE WBC PRESENT, PREDOMINANTLY PMN RARE GRAM NEGATIVE RODS    Culture   Final    FEW ENTEROBACTER CLOACAE REPEATING SUSCEPTIBILITY Performed at [facility], [place] [address], [place],   [address]    Report Status PENDING  Incomplete    Coagulation Studies: No results for input(s): LABPROT, INR in the last 72 hours.  Urinalysis: No results for input(s): COLORURINE, LABSPEC, PHURINE, GLUCOSEU, HGBUR, BILIRUBINUR, KETONESUR, PROTEINUR, UROBILINOGEN, NITRITE, LEUKOCYTESUR in the last 72 hours.  Invalid input(s): APPERANCEUR    Imaging: DG Chest Port 1 View  Result Date: 01/08/2020 CLINICAL DATA:  Pleural effusion EXAM: PORTABLE CHEST 1 VIEW COMPARISON:  January 07, 2020 FINDINGS: Endotracheal tube has been removed. Nasogastric tube has been removed. Left chest drain has been removed. Dual lumen catheter again noted with the distal tip at the cavoatrial junction. There is no appreciable pneumothorax. There is a small right pleural effusion. There is atelectatic change in each mid lung. There is ill-defined airspace opacity in the medial left upper lobe. There is also ill-defined opacity in the right mid lung and right base. Heart is mildly enlarged with pulmonary vascularity normal. No adenopathy. No bone lesions IMPRESSION: Ill-defined opacity concerning for focal pneumonia left upper lobe medially. Slightly less well-defined airspace opacity right mid lung and right base. Atelectatic change mid lung regions. Small right pleural effusion. Tube and catheter positions as described without appreciable pneumothorax. Electronically Signed   By: Lowella Grip III M.D.   On: 01/08/2020 09:00     Medications:   . dextrose Stopped (01/08/20 0842)   . amLODipine  10 mg Oral Daily  . ascorbic acid  250 mg Oral BID  . calcium-vitamin D  1  tablet Oral BID WC  . carvedilol  25 mg Oral BID WC  . Chlorhexidine Gluconate Cloth  6 each Topical Daily  . collagenase   Topical Daily  . epoetin (EPOGEN/PROCRIT) injection  4,000 Units Intravenous Q T,Th,Sa-HD  . [START ON 01/10/2020] famotidine  20 mg Oral Daily  . ferrous sulfate  325 mg Oral BID WC  . folic acid  1 mg Oral Daily  . gabapentin   300 mg Oral BID  . hydrALAZINE  25 mg Oral Q8H  . insulin aspart  0-6 Units Subcutaneous Q4H  . irbesartan  300 mg Oral Daily  . lacosamide  100 mg Oral BID  . levETIRAcetam  500 mg Oral BID  . metoCLOPramide  5 mg Oral TID AC  . mirtazapine  30 mg Oral QHS  . pantoprazole  40 mg Oral Daily  . QUEtiapine  25 mg Oral QHS  . sevelamer carbonate  800 mg Oral TID WC   acetaminophen, alteplase, heparin, hydrALAZINE, ipratropium-albuterol, lidocaine (PF), lidocaine-prilocaine, loperamide, ondansetron (ZOFRAN) IV, oxyCODONE, pentafluoroprop-tetrafluoroeth, polyethylene glycol  Assessment/ Plan:  [name] is a 34 y.o. white male with end stage renal disease on hemodialysis, hypertension, diabetes mellitus, GERD, IBS, osteomyelitis who was admitted to [facility] on 12/29/2019 for End stage renal disease on dialysis ([place]) [N18.6, Z99.2] Acute respiratory failure with hypoxia ([place]) [J96.01]   [facility] nephrology  1. End Stage renal disease:  TTS schedule.   Routine HD was done on saturday; 1000 cc removed 1000 cc removed with hemodialysis on Tuesday as well. Next hemodialysis planned for Thursday  2. Anemia of CKD Lab Results  Component Value Date   HGB 8.8 (L) 01/09/2020  EPO with HD   3.  Secondary Hyperparathyroidism:  - Calcium carbonate with meals. Lab Results  Component Value Date   CALCIUM 7.9 (L) 01/09/2020   PHOS 3.7 01/09/2020    4.  Seizure disorder - had breakthrough sz. Was seen by neurologist.  - currently on Vimpat and Ben[place].  5.  Large left pleural effusion, acute respiratory failure Chest tube placed July 4.  1700 cc removed Now with serosanguineous drainage.  Chest tube removed on July 5. Multifocal bilateral pneumonia seen on chest x-ray        LOS: [place] 7/7/20211:04 PM

## 2020-01-09 NOTE — Progress Notes (Signed)
PHARMACIST - PHYSICIAN COMMUNICATION  DR:   Billie Ruddy  CONCERNING: IV to Oral Route Change Policy  RECOMMENDATION: This patient is receiving famotidine by the intravenous route.  Based on criteria approved by the Pharmacy and Therapeutics Committee, the intravenous medication(s) is/are being converted to the equivalent oral dose form(s).   DESCRIPTION: These criteria include:  The patient is eating (either orally or via tube) and/or has been taking other orally administered medications for a least 24 hours  The patient has no evidence of active gastrointestinal bleeding or impaired GI absorption (gastrectomy, short bowel, patient on TNA or NPO).  If you have questions about this conversion, please contact the Pharmacy Department  []   [phone] )  [facility] [x]   [phone] )  [facility] []   [phone] )  [facility] []   [phone] )  [facility] []   [phone] )  [place], PharmD, BCPS Clinical Pharmacist 01/09/2020 10:42 AM

## 2020-01-09 NOTE — Progress Notes (Signed)
PROGRESS NOTE    DESTEN MANOR  YKD:[id number] DOB: 11-Jan-1986 DOA: 12/29/2019 PCP: Valera Castle, MD    Assessment & Plan:   Active Problems:   MDD (major depressive disorder)   Intractable vomiting with nausea   Diarrhea   Chronic foot pain (Primary Area of Pain) (Bilateral) (L>R)   Chronic pain syndrome   Long term current use of opiate analgesic   Acute respiratory failure with hypoxia (HCC)   Decubitus ulcer of coccyx, unspecified pressure ulcer stage   End stage renal disease on dialysis [facility])   Cavitary lesion of lung   Necrotizing pneumonia ([place])    34 y.o. male with PMH of ESRD on HD admitted 12/29/19 due to Acute Hypoxic Respiratory Failure in the setting of Pulmonary edema/Volume Overload and questionable Necrotizing Pneumonia requiring BiPAP.  Nephrology was consulted for urgent HD. Overnight 7/2 had breakthrough seizures and transferred to stepdown, transfer back to floor on 7/3.  On 7/4 found unresponsive with respiratory arrest requiring intubation and transfer to ICU. Transferred out of ICU on 7/7.   Persistent Diarrhea, POA Likely C diff colitis, POA --C diff antigen pos, toxin neg on presentation, didn't treat initially due to neg toxin, but given persistent diarrhea, will start oral vanc today 7/7  Severe acute hypoxemic respiratory failure  -Respiratory Arrest on 01/06/20 due to Large left pleural effusion and complete compressive atelectasis of LLL -recent hx Necrotizing Pneumonia -liberated from MV 01/07/20 -chest tube removed 01/07/20 -Hemodialysis for volume removal --wean O2 as tolerated (currently 2-3L)  Possible persistent Necrotizing Pneumonia -Monitor fever curve -Trend WBCs and procalcitonin-within referece range.  -Follow cultures as above -tracheal aspirate- negative while at [facility]- previosly CRE -Zosyn 9 days completed   Moderate protein-calorie malnutrition Dietary consultation Bitemporal and peripheral muscle  wasting   Hyponatremia  -euvolemic post D10W  -will d/c IVF and start salt tabs x 1 day tid  Seizure Activity - Likely breakthrough seizures due to non-compliant with seizure meds in addition to being on medication that could potentially lower seizure threshold (Levaquin + Zosyn) -Continue home Keppra and Vimpat -Seizure precautions -As needed Ativan for breakthrough seizure -Neurology following, appreciate input   ESRD on HD (T,Th,S) -Monitor I&O's / urinary output -Follow BMP -Ensure adequate renal perfusion -Avoid nephrotoxic agents as able -Replace electrolytes as indicated -Nephrology following, appreciate input -Hemodialysis as per nephrology   Anemia of Chronic Disease -Monitor for S/Sx of bleeding -Trend CBC -SQ Heparin for VTE Prophylaxis  -Transfuse for Hgb <7  Diabetes Mellitus -CBGs -SSI -Follow ICU hypo/hyperglycemia protocol   Persistent Nausea and vomiting LZ:JQBHALPF Gastroparesis -prn zofran -avoid phenergan--makes him groggy -Reglan IV 5 mg tid  Unstageable coccyx wound, Present on admission -Turn and reposition every two hours -Wound specialist following with dressing changes daily.    DVT prophylaxis: SCD/Compression stockings Code Status: Full code Palliative care following Family Communication:  Status is: inpatient Dispo:   The patient is from: home Anticipated d/c is to: home Anticipated d/c date is: 2-3 days Patient currently is not medically stable to d/c due to: persistent diarrhea, starting treatment for C diff.  Persistent O2 requirement.   Subjective and Interval History:  Pt requested Xanax repeatedly.  Otherwise no complaints.  Nursing reported continued stool leaking out.   Objective: Vitals:   01/10/20 1805 01/10/20 1820 01/10/20 1841 01/10/20 1908  BP: (!) 171/86 (!) 180/85  (!) 177/88  Pulse:      Resp:      Temp:   97.7 F (36.5 C)  TempSrc:   Oral   SpO2:      Weight:   63.7 kg   Height:         Intake/Output Summary (Last 24 hours) at 01/10/2020 2009 Last data filed at 01/10/2020 1841 Gross per 24 hour  Intake --  Output 1000 ml  Net -1000 ml   Filed Weights   01/10/20 0500 01/10/20 1500 01/10/20 1841  Weight: 64.1 kg 65.1 kg 63.7 kg    Examination:   Constitutional: NAD, oriented, lethargic HEENT: conjunctivae and lids normal, EOMI CV: RRR no M,R,G. Distal pulses +2.  No cyanosis.   RESP: CTA B/L, normal respiratory effort, on 2L GI: +BS, NTND Extremities: No effusions, edema, or tenderness in LLE.  Right BKA. SKIN: warm, dry and intact Neuro: II - XII grossly intact.  Sensation intact   Data Reviewed: I have personally reviewed following labs and imaging studies  CBC: Recent Labs  Lab 01/06/20 0003 01/07/20 0435 01/08/20 0538 01/09/20 0415 01/10/20 0521  WBC 4.3 4.1 4.9 5.1 6.7  NEUTROABS 3.1 2.1  --   --   --   HGB 8.3* 9.8* 9.5* 8.8* 8.8*  HCT 28.5* 29.4* 29.5* 28.7* 29.5*  MCV 91.3 81.2 82.6 86.2 89.4  PLT 137* 151 146* 144* 700*   Basic Metabolic Panel: Recent Labs  Lab 01/05/20 0926 01/05/20 0926 01/06/20 0003 01/07/20 0435 01/08/20 0538 01/09/20 0415 01/10/20 0521  NA 129*   < > 133* 131* 125* 139 139  K 4.4   < > 4.1 4.0 4.2 3.9 4.6  CL 91*   < > 95* 95* 89* 102 104  CO2 26   < > 26 22 22 28 25   GLUCOSE 133*   < > 145* 93 153* 161* 140*  BUN 38*   < > 27* 34* 38* 20 35*  CREATININE 4.51*   < > 3.19* 4.02* 4.80* 2.88* 3.90*  CALCIUM 8.2*   < > 8.4* 8.2* 8.0* 7.9* 8.3*  MG  --   --  1.5*  --  1.5* 2.0 2.1  PHOS 5.8*  --   --  4.1 5.4* 3.7  --    < > = values in this interval not displayed.   GFR: Estimated Creatinine Clearance: 24.5 mL/min (A) (by C-G formula based on SCr of 3.9 mg/dL (H)). Liver Function Tests: Recent Labs  Lab 01/05/20 0926 01/06/20 0003 01/07/20 0435 01/08/20 0538 01/09/20 0415  AST  --  13*  --   --   --   ALT  --  10  --   --   --   ALKPHOS  --  77  --   --   --   BILITOT  --  0.7  --   --   --    PROT  --  6.2*  --   --   --   ALBUMIN 2.8* 2.5* 2.3* 2.2* 2.2*   No results for input(s): LIPASE, AMYLASE in the last 168 hours. No results for input(s): AMMONIA in the last 168 hours. Coagulation Profile: No results for input(s): INR, PROTIME in the last 168 hours. Cardiac Enzymes: No results for input(s): CKTOTAL, CKMB, CKMBINDEX, TROPONINI in the last 168 hours. BNP (last 3 results) No results for input(s): PROBNP in the last 8760 hours. HbA1C: No results for input(s): HGBA1C in the last 72 hours. CBG: Recent Labs  Lab 01/09/20 2347 01/10/20 0420 01/10/20 0746 01/10/20 1141 01/10/20 1628  GLUCAP 155* 124* 121* 138* 104*   Lipid Profile: No  results for input(s): CHOL, HDL, LDLCALC, TRIG, CHOLHDL, LDLDIRECT in the last 72 hours. Thyroid Function Tests: No results for input(s): TSH, T4TOTAL, FREET4, T3FREE, THYROIDAB in the last 72 hours. Anemia Panel: No results for input(s): VITAMINB12, FOLATE, FERRITIN, TIBC, IRON, RETICCTPCT in the last 72 hours. Sepsis Labs: Recent Labs  Lab 01/06/20 0003  PROCALCITON 2.18  LATICACIDVEN 0.6    Recent Results (from the past 240 hour(s))  Body fluid culture     Status: None   Collection Time: 01/06/20  3:08 PM   Specimen: Body Fluid  Result Value Ref Range Status   Specimen Description   Final    FLUID Performed at [facility], [address] [place], [place] [address]    Special Requests   Final    NONE Performed at [facility], [address] [place], [place] [address]    Gram Stain   Final    RARE WBC PRESENT, PREDOMINANTLY MONONUCLEAR NO ORGANISMS SEEN    Culture   Final    NO GROWTH 3 DAYS Performed at [facility], [place] [address] [place], [place] [address]    Report Status 01/09/2020 FINAL  Final  Acid Fast Smear (AFB)     Status: None   Collection Time: 01/06/20  3:08 PM   Specimen: Pleural, Left; Pleural Fluid  Result Value Ref Range Status   AFB Specimen Processing  Concentration  Final   Acid Fast Smear Negative  Final    Comment: (NOTE) Performed At: [facility] [place], [place] [id number] Rush Farmer MD BZ:[id number]    Source (AFB) PLEURAL  Final    Comment: Performed at [facility], [address] [place], [place] [address]  Culture, respiratory (non-expectorated)     Status: None (Preliminary result)   Collection Time: 01/07/20 11:19 AM   Specimen: Tracheal Aspirate; Respiratory  Result Value Ref Range Status   Specimen Description   Final    TRACHEAL ASPIRATE Performed at [facility], [address] [place], [place] [address]    Special Requests   Final    NONE Performed at [facility], [address] [place], [place] [address]    Gram Stain   Final    RARE WBC PRESENT, PREDOMINANTLY PMN RARE GRAM NEGATIVE RODS Performed at [facility], [place] [address] [place], [place] [address]    Culture   Final    FEW ENTEROBACTER CLOACAE CONFIRMED CARBAPENEMASE RESISTANT ENTEROBACTERIACAE    Report Status PENDING  Incomplete   Organism ID, Bacteria ENTEROBACTER CLOACAE  Final      Susceptibility   Enterobacter cloacae - MIC*    CEFAZOLIN >=64 RESISTANT Resistant     CEFEPIME >=32 RESISTANT Resistant     CEFTAZIDIME >=64 RESISTANT Resistant     CIPROFLOXACIN >=4 RESISTANT Resistant     GENTAMICIN >=16 RESISTANT Resistant     IMIPENEM >=16 RESISTANT Resistant     TRIMETH/SULFA >=320 RESISTANT Resistant     PIP/TAZO >=128 RESISTANT Resistant     * FEW ENTEROBACTER CLOACAE      Radiology Studies: No results found.   Scheduled Meds: . amLODipine  10 mg Oral Daily  . ascorbic acid  250 mg Oral BID  . calcium-vitamin D  1 tablet Oral BID WC  . carvedilol  25 mg Oral BID WC  . Chlorhexidine Gluconate Cloth  6 each Topical Daily  . collagenase   Topical Daily  . epoetin (EPOGEN/PROCRIT) injection  4,000 Units Intravenous  Q T,Th,Sa-HD  . famotidine  20 mg Oral Daily   . ferrous sulfate  325 mg Oral BID WC  . folic acid  1 mg Oral Daily  . gabapentin  300 mg Oral BID  . hydrALAZINE  25 mg Oral Q8H  . insulin aspart  0-6 Units Subcutaneous Q4H  . irbesartan  300 mg Oral Daily  . lacosamide  100 mg Oral BID  . levETIRAcetam  500 mg Oral BID  . metoCLOPramide  5 mg Oral TID AC  . mirtazapine  30 mg Oral QHS  . pantoprazole  40 mg Oral Daily  . QUEtiapine  25 mg Oral QHS  . sevelamer carbonate  800 mg Oral TID WC  . vancomycin  125 mg Oral QID   Continuous Infusions: . dextrose Stopped (01/08/20 0842)     LOS: 12 days     Enzo Bi, MD [facility] If 7PM-7AM, please contact night-coverage 01/10/2020, 8:09 PM

## 2020-01-09 NOTE — Progress Notes (Signed)
Patient is constantly having loose stool run out this evening. The CNA said she had been in the room for 30 minutes and was unable to leave because the patient kept oozing stool. Order received for rectal tube

## 2020-01-10 LAB — MAGNESIUM: Magnesium: 2.1 mg/dL (ref 1.7–2.4)

## 2020-01-10 LAB — BASIC METABOLIC PANEL
Anion gap: 10 (ref 5–15)
BUN: 35 mg/dL — ABNORMAL HIGH (ref 6–20)
CO2: 25 mmol/L (ref 22–32)
Calcium: 8.3 mg/dL — ABNORMAL LOW (ref 8.9–10.3)
Chloride: 104 mmol/L (ref 98–111)
Creatinine, Ser: 3.9 mg/dL — ABNORMAL HIGH (ref 0.61–1.24)
GFR calc Af Amer: 22 mL/min — ABNORMAL LOW (ref >60)
GFR calc non Af Amer: 19 mL/min — ABNORMAL LOW (ref >60)
Glucose, Bld: 140 mg/dL — ABNORMAL HIGH (ref 70–99)
Potassium: 4.6 mmol/L (ref 3.5–5.1)
Sodium: 139 mmol/L (ref 135–145)

## 2020-01-10 LAB — CBC
HCT: 29.5 % — ABNORMAL LOW (ref 39.0–52.0)
Hemoglobin: 8.8 g/dL — ABNORMAL LOW (ref 13.0–17.0)
MCH: 26.7 pg (ref 26.0–34.0)
MCHC: 29.8 g/dL — ABNORMAL LOW (ref 30.0–36.0)
MCV: 89.4 fL (ref 80.0–100.0)
Platelets: 148 10*3/uL — ABNORMAL LOW (ref 150–400)
RBC: 3.3 MIL/uL — ABNORMAL LOW (ref 4.22–5.81)
RDW: 17.2 % — ABNORMAL HIGH (ref 11.5–15.5)
WBC: 6.7 10*3/uL (ref 4.0–10.5)
nRBC: 0 % (ref 0.0–0.2)

## 2020-01-10 LAB — GLUCOSE, CAPILLARY
Glucose-Capillary: 124 mg/dL — ABNORMAL HIGH (ref 70–99)
Glucose-Capillary: 121 mg/dL — ABNORMAL HIGH (ref 70–99)
Glucose-Capillary: 138 mg/dL — ABNORMAL HIGH (ref 70–99)
Glucose-Capillary: 104 mg/dL — ABNORMAL HIGH (ref 70–99)
Glucose-Capillary: 137 mg/dL — ABNORMAL HIGH (ref 70–99)
Glucose-Capillary: 179 mg/dL — ABNORMAL HIGH (ref 70–99)

## 2020-01-10 NOTE — Progress Notes (Signed)
[facility]  ROUNDING NOTE   Subjective:     No acute complaints No acute events reported by nursing staff Having frequent loose stools and having to be changed frequently Denies shortness of breath  Objective:  Vital signs in last 24 hours:  Temp:  [97.7 F (36.5 C)-98.6 F (37 C)] 97.7 F (36.5 C) (07/08 1146) Pulse Rate:  [79-96] 79 (07/08 1147) Resp:  [16-17] 16 (07/08 1146) BP: (117-160)/(70-90) 144/80 (07/08 1147) SpO2:  [94 %-97 %] 94 % (07/08 1146) Weight:  [64.1 kg] 64.1 kg (07/08 0500)  Weight change:  Filed Weights   01/07/20 0354 01/08/20 0411 01/10/20 0500  Weight: 61.3 kg 64.6 kg 64.1 kg    Intake/Output: I/O last 3 completed shifts: In: 510 [P.O.:240; IV Piggyback:270] Out: -    Intake/Output this shift:  No intake/output data recorded.  Physical Exam: General:  Chronically ill-appearing, laying in the bed  HEENT  moist oral mucous membranes  Pulm/lungs  coarse breath sounds, Adams O2  CVS/Heart  no rub or gallop  Abdomen:   Soft, nontender  Extremities:  + peripheral edema  Neurologic:  Able to follow commands and answer questions  Skin:  No acute rashes  Left IJ PermCath   Basic Metabolic Panel: Recent Labs  Lab 01/05/20 0926 01/05/20 0926 01/06/20 0003 01/06/20 0003 01/07/20 0435 01/07/20 0435 01/08/20 0538 01/09/20 0415 01/10/20 0521  NA 129*   < > 133*  --  131*  --  125* 139 139  K 4.4   < > 4.1  --  4.0  --  4.2 3.9 4.6  CL 91*   < > 95*  --  95*  --  89* 102 104  CO2 26   < > 26  --  22  --  22 28 25   GLUCOSE 133*   < > 145*  --  93  --  153* 161* 140*  BUN 38*   < > 27*  --  34*  --  38* 20 35*  CREATININE 4.51*   < > 3.19*  --  4.02*  --  4.80* 2.88* 3.90*  CALCIUM 8.2*   < > 8.4*   < > 8.2*   < > 8.0* 7.9* 8.3*  MG  --   --  1.5*  --   --   --  1.5* 2.0 2.1  PHOS 5.8*  --   --   --  4.1  --  5.4* 3.7  --    < > = values in this interval not displayed.    Liver Function Tests: Recent Labs  Lab  01/05/20 0926 01/06/20 0003 01/07/20 0435 01/08/20 0538 01/09/20 0415  AST  --  13*  --   --   --   ALT  --  10  --   --   --   ALKPHOS  --  77  --   --   --   BILITOT  --  0.7  --   --   --   PROT  --  6.2*  --   --   --   ALBUMIN 2.8* 2.5* 2.3* 2.2* 2.2*   No results for input(s): LIPASE, AMYLASE in the last 168 hours. No results for input(s): AMMONIA in the last 168 hours.  CBC: Recent Labs  Lab 01/06/20 0003 01/07/20 0435 01/08/20 0538 01/09/20 0415 01/10/20 0521  WBC 4.3 4.1 4.9 5.1 6.7  NEUTROABS 3.1 2.1  --   --   --  HGB 8.3* 9.8* 9.5* 8.8* 8.8*  HCT 28.5* 29.4* 29.5* 28.7* 29.5*  MCV 91.3 81.2 82.6 86.2 89.4  PLT 137* 151 146* 144* 148*    Cardiac Enzymes: No results for input(s): CKTOTAL, CKMB, CKMBINDEX, TROPONINI in the last 168 hours.  BNP: Invalid input(s): POCBNP  CBG: Recent Labs  Lab 01/09/20 2026 01/09/20 2347 01/10/20 0420 01/10/20 0746 01/10/20 1141  GLUCAP 180* 155* 124* 121* 138*    Microbiology: Results for orders placed or performed during the hospital encounter of 12/29/19  SARS Coronavirus 2 by RT PCR (hospital order, performed in [facility] hospital lab) Nasopharyngeal Nasopharyngeal Swab     Status: None   Collection Time: 12/29/19 10:59 PM   Specimen: Nasopharyngeal Swab  Result Value Ref Range Status   SARS Coronavirus 2 NEGATIVE NEGATIVE Final    Comment: (NOTE) SARS-CoV-2 target nucleic acids are NOT DETECTED.  The SARS-CoV-2 RNA is generally detectable in upper and lower respiratory specimens during the acute phase of infection. The lowest concentration of SARS-CoV-2 viral copies this assay can detect is 250 copies / mL. A negative result does not preclude SARS-CoV-2 infection and should not be used as the sole basis for treatment or other patient management decisions.  A negative result may occur with improper specimen collection / handling, submission of specimen other than nasopharyngeal swab, presence of viral  mutation(s) within the areas targeted by this assay, and inadequate number of viral copies (<250 copies / mL). A negative result must be combined with clinical observations, patient history, and epidemiological information.  Fact Sheet for Patients:   StrictlyIdeas.no  Fact Sheet for Healthcare Providers: BankingDealers.co.za  This test is not yet approved or  cleared by the [place] FDA and has been authorized for detection and/or diagnosis of SARS-CoV-2 by FDA under an Emergency Use Authorization (EUA).  This EUA will remain in effect (meaning this test can be used) for the duration of the COVID-19 declaration under Section 564(b)(1) of the Act, 21 U.S.C. section 360bbb-3(b)(1), unless the authorization is terminated or revoked sooner.  Performed at [facility], [address] [place], [place] [address]   Blood culture (routine x 2)     Status: None   Collection Time: 12/29/19 10:59 PM   Specimen: BLOOD  Result Value Ref Range Status   Specimen Description BLOOD RIGHT ANTECUBITAL  Final   Special Requests   Final    BOTTLES DRAWN AEROBIC AND ANAEROBIC Blood Culture adequate volume   Culture   Final    NO GROWTH 5 DAYS Performed at [facility], [address] [place], [place] [address]    Report Status 01/03/2020 FINAL  Final  MRSA PCR Screening     Status: None   Collection Time: 12/30/19 12:25 AM   Specimen: Nasopharyngeal  Result Value Ref Range Status   MRSA by PCR NEGATIVE NEGATIVE Final    Comment:        The GeneXpert MRSA Assay (FDA approved for NASAL specimens only), is one component of a comprehensive MRSA colonization surveillance program. It is not intended to diagnose MRSA infection nor to guide or monitor treatment for MRSA infections. Performed at [facility], [place], [place] [address]   C Difficile Quick Screen w PCR reflex     Status: Abnormal    Collection Time: 12/30/19  1:50 PM   Specimen: STOOL  Result Value Ref Range Status   C Diff antigen POSITIVE (A) NEGATIVE Final   C Diff toxin NEGATIVE  NEGATIVE Final   C Diff interpretation Results are indeterminate. See PCR results.  Final    Comment: Performed at [facility], [address] [place], [place] [address]  C. Diff by PCR, Reflexed     Status: Abnormal   Collection Time: 12/30/19  1:50 PM  Result Value Ref Range Status   Toxigenic C. Difficile by PCR POSITIVE (A) NEGATIVE Final    Comment: Positive for toxigenic C. difficile with little to no toxin production. Only treat if clinical presentation suggests symptomatic illness. Performed at [facility], [address] [place], [place] [address]   Body fluid culture     Status: None   Collection Time: 01/06/20  3:08 PM   Specimen: Body Fluid  Result Value Ref Range Status   Specimen Description   Final    FLUID Performed at [facility], [address] [place], [place] [address]    Special Requests   Final    NONE Performed at [facility], [address] [place], [place] [address]    Gram Stain   Final    RARE WBC PRESENT, PREDOMINANTLY MONONUCLEAR NO ORGANISMS SEEN    Culture   Final    NO GROWTH 3 DAYS Performed at [facility], [place] [address], [place], [place] [address]    Report Status 01/09/2020 FINAL  Final  Acid Fast Smear (AFB)     Status: None   Collection Time: 01/06/20  3:08 PM   Specimen: Pleural, Left; Pleural Fluid  Result Value Ref Range Status   AFB Specimen Processing Concentration  Final   Acid Fast Smear Negative  Final    Comment: (NOTE) Performed At: [facility] [place], [place] [id number] Rush Farmer MD [id number]    Source (AFB) PLEURAL  Final    Comment: Performed at [facility], [address] [place], [place] [address]  Culture, respiratory (non-expectorated)     Status: None (Preliminary  result)   Collection Time: 01/07/20 11:19 AM   Specimen: Tracheal Aspirate; Respiratory  Result Value Ref Range Status   Specimen Description   Final    TRACHEAL ASPIRATE Performed at [facility], [address] [place], [place] [address]    Special Requests   Final    NONE Performed at [facility], [address] [place], [place] [address]    Gram Stain   Final    RARE WBC PRESENT, PREDOMINANTLY PMN RARE GRAM NEGATIVE RODS    Culture   Final    FEW ENTEROBACTER CLOACAE REPEATING SUSCEPTIBILITY Performed at [facility], [place] [address], [place], [place] [address]    Report Status PENDING  Incomplete    Coagulation Studies: No results for input(s): LABPROT, INR in the last 72 hours.  Urinalysis: No results for input(s): COLORURINE, LABSPEC, PHURINE, GLUCOSEU, HGBUR, BILIRUBINUR, KETONESUR, PROTEINUR, UROBILINOGEN, NITRITE, LEUKOCYTESUR in the last 72 hours.  Invalid input(s): APPERANCEUR    Imaging: No results found.   Medications:   . dextrose Stopped (01/08/20 0842)   . amLODipine  10 mg Oral Daily  . ascorbic acid  250 mg Oral BID  . calcium-vitamin D  1 tablet Oral BID WC  . carvedilol  25 mg Oral BID WC  . Chlorhexidine Gluconate Cloth  6 each Topical Daily  . collagenase   Topical Daily  . epoetin (EPOGEN/PROCRIT) injection  4,000 Units Intravenous Q T,Th,Sa-HD  . famotidine  20 mg Oral Daily  . ferrous sulfate  325 mg Oral BID WC  . folic acid  1 mg Oral Daily  . gabapentin  300 mg Oral BID  . hydrALAZINE  25 mg Oral Q8H  . insulin aspart  0-6 Units Subcutaneous Q4H  . irbesartan  300 mg Oral Daily  . lacosamide  100 mg Oral BID  . levETIRAcetam  500 mg Oral BID  . metoCLOPramide  5 mg Oral TID AC  . mirtazapine  30 mg Oral QHS  . pantoprazole  40 mg Oral Daily  . QUEtiapine  25 mg Oral QHS  . sevelamer carbonate  800 mg Oral TID WC  . vancomycin  125 mg Oral QID   acetaminophen, ALPRAZolam, alteplase, heparin,  hydrALAZINE, ipratropium-albuterol, lidocaine (PF), lidocaine-prilocaine, loperamide, ondansetron (ZOFRAN) IV, oxyCODONE, pentafluoroprop-tetrafluoroeth, polyethylene glycol  Assessment/ Plan:  [name] is a 34 y.o. white male with end stage renal disease on hemodialysis, hypertension, diabetes mellitus, GERD, IBS, osteomyelitis who was admitted to [facility] on 12/29/2019 for End stage renal disease on dialysis ([place]) [N18.6, Z99.2] Acute respiratory failure with hypoxia ([place]) [J96.01]   [facility] nephrology  1. End Stage renal disease:  TTS schedule.   Hemodialysis planned for later today. Volume removal as tolerated  2. Anemia of CKD Lab Results  Component Value Date   HGB 8.8 (L) 01/10/2020  EPO with HD   3.  Secondary Hyperparathyroidism:  - Calcium carbonate with meals. Lab Results  Component Value Date   CALCIUM 8.3 (L) 01/10/2020   PHOS 3.7 01/09/2020    4.  Seizure disorder - had breakthrough sz. Was seen by neurologist.  - currently on Vimpat and Luna.  5.  Large left pleural effusion, acute respiratory failure Chest tube placed July 4 with serosanguineous drainage.  1700 cc removed Chest tube removed on July 5. Multifocal bilateral pneumonia seen on chest x-ray  6.  C. difficile colitis Being treated with oral vancomycin        LOS: 12 Jerran Tappan 7/8/202112:49 PM

## 2020-01-10 NOTE — Progress Notes (Addendum)
PROGRESS NOTE    Shawn [name]  NUU:[id number] DOB: 1986/05/07 DOA: 12/29/2019 PCP: Valera Castle, MD    Assessment & Plan:   Active Problems:   MDD (major depressive disorder)   Intractable vomiting with nausea   Diarrhea   Chronic foot pain (Primary Area of Pain) (Bilateral) (L>R)   Chronic pain syndrome   Long term current use of opiate analgesic   Acute respiratory failure with hypoxia (HCC)   Decubitus ulcer of coccyx, unspecified pressure ulcer stage   End stage renal disease on dialysis [facility])   Cavitary lesion of lung   Necrotizing pneumonia (Shawn [name])    34 y.o. male with PMH of ESRD on HD admitted 12/29/19 due to Acute Hypoxic Respiratory Failure in the setting of Pulmonary edema/Volume Overload and questionable Necrotizing Pneumonia requiring BiPAP.  Nephrology was consulted for urgent HD. Overnight 7/2 had breakthrough seizures and transferred to stepdown, transfer back to floor on 7/3.  On 7/4 found unresponsive with respiratory arrest requiring intubation and transfer to ICU. Transferred out of ICU on 7/7.   Persistent Diarrhea, POA Likely C diff colitis, POA --C diff antigen pos, toxin neg on presentation, didn't treat initially due to neg toxin, but given persistent diarrhea, will start oral vanc on 7/7 --continue oral vanc for a 10-day course  Severe acute hypoxemic respiratory failure  -Respiratory Arrest on 01/06/20 due to Large left pleural effusion and complete compressive atelectasis of LLL -recent hx Necrotizing Pneumonia -liberated from MV 01/07/20 -chest tube removed 01/07/20 -Hemodialysis for volume removal --wean O2 as tolerated (currently 2-3L)  Possible persistent Necrotizing Pneumonia -Monitor fever curve -Trend WBCs and procalcitonin-within referece range.  -Follow cultures as above -tracheal aspirate- negative while at [facility]- previosly CRE -Zosyn 9 days completed   Moderate protein-calorie malnutrition Dietary consultation Bitemporal  and peripheral muscle wasting   Hyponatremia  -euvolemic post D10W  -will d/c IVF and start salt tabs x 1 day tid  Seizure Activity - Likely breakthrough seizures due to non-compliant with seizure meds in addition to being on medication that could potentially lower seizure threshold (Levaquin + Zosyn) -Continue home Keppra and Vimpat -Seizure precautions -As needed Ativan for breakthrough seizure -Neurology following, appreciate input   ESRD on HD (T,Th,S) -Monitor I&O's / urinary output -Follow BMP -Ensure adequate renal perfusion -Avoid nephrotoxic agents as able -Replace electrolytes as indicated -Nephrology following, appreciate input -Hemodialysis as per nephrology   Anemia of Chronic Disease -Monitor for S/Sx of bleeding -Trend CBC -SQ Heparin for VTE Prophylaxis  -Transfuse for Hgb <7  Diabetes Mellitus -CBGs -SSI -Follow ICU hypo/hyperglycemia protocol   Persistent Nausea and vomiting HK:[id number] Gastroparesis -prn zofran -avoid phenergan--makes him groggy -Reglan IV 5 mg tid  Unstageable coccyx wound, Present on admission -Turn and reposition every two hours -Wound specialist following with dressing changes daily.    DVT prophylaxis: SCD/Compression stockings Code Status: Full code Palliative care following Family Communication:  Status is: inpatient Dispo:   The patient is from: home Anticipated d/c is to: home Anticipated d/c date is: 2-3 days Patient currently is not medically stable to d/c due to: persistent diarrhea, starting treatment for C diff.  Persistent O2 requirement.   Subjective and Interval History:  Diarrhea improved.  Rectal tube out.     Objective: Vitals:   01/10/20 1805 01/10/20 1820 01/10/20 1841 01/10/20 1908  BP: (!) 171/86 (!) 180/85  (!) 177/88  Pulse:      Resp:      Temp:   97.7 F (36.5 C)  TempSrc:   Oral   SpO2:      Weight:   63.7 kg   Height:        Intake/Output Summary (Last 24  hours) at 01/10/2020 2020 Last data filed at 01/10/2020 1841 Gross per 24 hour  Intake --  Output 1000 ml  Net -1000 ml   Filed Weights   01/10/20 0500 01/10/20 1500 01/10/20 1841  Weight: 64.1 kg 65.1 kg 63.7 kg    Examination:   Constitutional: NAD, oriented, lethargic HEENT: conjunctivae and lids normal, EOMI CV: RRR no M,R,G. Distal pulses +2.  No cyanosis.   RESP: CTA B/L, normal respiratory effort, on 2L GI: +BS, NTND Extremities: No effusions, edema, or tenderness in LLE.  Right BKA. SKIN: warm, dry and intact Neuro: II - XII grossly intact.  Sensation intact   Data Reviewed: I have personally reviewed following labs and imaging studies  CBC: Recent Labs  Lab 01/06/20 0003 01/07/20 0435 01/08/20 0538 01/09/20 0415 01/10/20 0521  WBC 4.3 4.1 4.9 5.1 6.7  NEUTROABS 3.1 2.1  --   --   --   HGB 8.3* 9.8* 9.5* 8.8* 8.8*  HCT 28.5* 29.4* 29.5* 28.7* 29.5*  MCV 91.3 81.2 82.6 86.2 89.4  PLT 137* 151 146* 144* 532*   Basic Metabolic Panel: Recent Labs  Lab 01/05/20 0926 01/05/20 0926 01/06/20 0003 01/07/20 0435 01/08/20 0538 01/09/20 0415 01/10/20 0521  NA 129*   < > 133* 131* 125* 139 139  K 4.4   < > 4.1 4.0 4.2 3.9 4.6  CL 91*   < > 95* 95* 89* 102 104  CO2 26   < > 26 22 22 28 25   GLUCOSE 133*   < > 145* 93 153* 161* 140*  BUN 38*   < > 27* 34* 38* 20 35*  CREATININE 4.51*   < > 3.19* 4.02* 4.80* 2.88* 3.90*  CALCIUM 8.2*   < > 8.4* 8.2* 8.0* 7.9* 8.3*  MG  --   --  1.5*  --  1.5* 2.0 2.1  PHOS 5.8*  --   --  4.1 5.4* 3.7  --    < > = values in this interval not displayed.   GFR: Estimated Creatinine Clearance: 24.5 mL/min (A) (by C-G formula based on SCr of 3.9 mg/dL (H)). Liver Function Tests: Recent Labs  Lab 01/05/20 0926 01/06/20 0003 01/07/20 0435 01/08/20 0538 01/09/20 0415  AST  --  13*  --   --   --   ALT  --  10  --   --   --   ALKPHOS  --  77  --   --   --   BILITOT  --  0.7  --   --   --   PROT  --  6.2*  --   --   --   ALBUMIN  2.8* 2.5* 2.3* 2.2* 2.2*   No results for input(s): LIPASE, AMYLASE in the last 168 hours. No results for input(s): AMMONIA in the last 168 hours. Coagulation Profile: No results for input(s): INR, PROTIME in the last 168 hours. Cardiac Enzymes: No results for input(s): CKTOTAL, CKMB, CKMBINDEX, TROPONINI in the last 168 hours. BNP (last 3 results) No results for input(s): PROBNP in the last 8760 hours. HbA1C: No results for input(s): HGBA1C in the last 72 hours. CBG: Recent Labs  Lab 01/09/20 2347 01/10/20 0420 01/10/20 0746 01/10/20 1141 01/10/20 1628  GLUCAP 155* 124* 121* 138* 104*   Lipid Profile: No  results for input(s): CHOL, HDL, LDLCALC, TRIG, CHOLHDL, LDLDIRECT in the last 72 hours. Thyroid Function Tests: No results for input(s): TSH, T4TOTAL, FREET4, T3FREE, THYROIDAB in the last 72 hours. Anemia Panel: No results for input(s): VITAMINB12, FOLATE, FERRITIN, TIBC, IRON, RETICCTPCT in the last 72 hours. Sepsis Labs: Recent Labs  Lab 01/06/20 0003  PROCALCITON 2.18  LATICACIDVEN 0.6    Recent Results (from the past 240 hour(s))  Body fluid culture     Status: None   Collection Time: 01/06/20  3:08 PM   Specimen: Body Fluid  Result Value Ref Range Status   Specimen Description   Final    FLUID Performed at [facility], [address] [place], [place] [address]    Special Requests   Final    NONE Performed at [facility], [address] [place], [place] [address]    Gram Stain   Final    RARE WBC PRESENT, PREDOMINANTLY MONONUCLEAR NO ORGANISMS SEEN    Culture   Final    NO GROWTH 3 DAYS Performed at [facility], [place] [address] [place], [place] [address]    Report Status 01/09/2020 FINAL  Final  Acid Fast Smear (AFB)     Status: None   Collection Time: 01/06/20  3:08 PM   Specimen: Pleural, Left; Pleural Fluid  Result Value Ref Range Status   AFB Specimen Processing Concentration  Final   Acid Fast Smear Negative   Final    Comment: (NOTE) Performed At: [facility] [place], [place] [id number] Rush Farmer MD [id number]    Source (AFB) PLEURAL  Final    Comment: Performed at [facility], [address] [place], [place] [address]  Culture, respiratory (non-expectorated)     Status: None (Preliminary result)   Collection Time: 01/07/20 11:19 AM   Specimen: Tracheal Aspirate; Respiratory  Result Value Ref Range Status   Specimen Description   Final    TRACHEAL ASPIRATE Performed at [facility], [address] [place], [place] [address]    Special Requests   Final    NONE Performed at [facility], [address] [place], [place] [address]    Gram Stain   Final    RARE WBC PRESENT, PREDOMINANTLY PMN RARE GRAM NEGATIVE RODS Performed at [facility], [place] [address], [place], [place] [address]    Culture   Final    FEW ENTEROBACTER CLOACAE CONFIRMED CARBAPENEMASE RESISTANT ENTEROBACTERIACAE    Report Status PENDING  Incomplete   Organism ID, Bacteria ENTEROBACTER CLOACAE  Final      Susceptibility   Enterobacter cloacae - MIC*    CEFAZOLIN >=64 RESISTANT Resistant     CEFEPIME >=32 RESISTANT Resistant     CEFTAZIDIME >=64 RESISTANT Resistant     CIPROFLOXACIN >=4 RESISTANT Resistant     GENTAMICIN >=16 RESISTANT Resistant     IMIPENEM >=16 RESISTANT Resistant     TRIMETH/SULFA >=320 RESISTANT Resistant     PIP/TAZO >=128 RESISTANT Resistant     * FEW ENTEROBACTER CLOACAE      Radiology Studies: No results found.   Scheduled Meds: . amLODipine  10 mg Oral Daily  . ascorbic acid  250 mg Oral BID  . calcium-vitamin D  1 tablet Oral BID WC  . carvedilol  25 mg Oral BID WC  . Chlorhexidine Gluconate Cloth  6 each Topical Daily  . collagenase   Topical Daily  . epoetin (EPOGEN/PROCRIT) injection  4,000 Units Intravenous  Q T,Th,Sa-HD  . famotidine  20 mg Oral Daily  . ferrous sulfate  325 mg Oral BID WC  . folic  acid  1 mg Oral Daily  . gabapentin  300 mg Oral BID  . hydrALAZINE  25 mg Oral Q8H  . insulin aspart  0-6 Units Subcutaneous Q4H  . irbesartan  300 mg Oral Daily  . lacosamide  100 mg Oral BID  . levETIRAcetam  500 mg Oral BID  . metoCLOPramide  5 mg Oral TID AC  . mirtazapine  30 mg Oral QHS  . pantoprazole  40 mg Oral Daily  . QUEtiapine  25 mg Oral QHS  . sevelamer carbonate  800 mg Oral TID WC  . vancomycin  125 mg Oral QID   Continuous Infusions: . dextrose Stopped (01/08/20 0842)     LOS: 12 days     Enzo Bi, MD [facility] If 7PM-7AM, please contact night-coverage 01/10/2020, 8:20 PM

## 2020-01-10 NOTE — TOC Progression Note (Signed)
Transition of Care [facility]) - Progression Note    Patient Details  Name: Shawn [name] MRN: [id number] Date of Birth: February 23, 1986  Transition of [facility] CM/SW Contact  Beverly Sessions, RN Phone Number: 01/10/2020, 12:34 PM  Clinical Narrative:     Barriers: initiated on vanc Rectal tube placed Remains on acute O2  Expected Discharge Plan: Home/Self Care Barriers to Discharge: Continued Medical Work up  Expected Discharge Plan and Services Expected Discharge Plan: Home/Self Care   Discharge Planning Services: CM Consult   Living arrangements for the past 2 months: Single Family Home                                       Social Determinants of Health (SDOH) Interventions    Readmission Risk Interventions Readmission Risk Prevention Plan 01/02/2020 10/13/2018 09/21/2018  Transportation Screening Complete Complete Complete  Medication Review Press photographer) Complete Complete Complete  PCP or Specialist appointment within 3-5 days of discharge - Complete Complete  HRI or [place] - Complete Complete  SW Recovery Care/Counseling Consult - (No Data) Complete  Palliative Care Screening - Not Applicable Not Applicable  Skilled Nursing Facility - Not Applicable Not Applicable  Some recent data might be hidden

## 2020-01-10 NOTE — Progress Notes (Signed)
Ch attempted visit to complete AD with Pt. Pt resting at this time, Ch will try again later, or pass visit onto incoming chaplains in the morning.

## 2020-01-11 LAB — BASIC METABOLIC PANEL
Anion gap: 10 (ref 5–15)
BUN: 28 mg/dL — ABNORMAL HIGH (ref 6–20)
CO2: 27 mmol/L (ref 22–32)
Calcium: 8.3 mg/dL — ABNORMAL LOW (ref 8.9–10.3)
Chloride: 101 mmol/L (ref 98–111)
Creatinine, Ser: 3.11 mg/dL — ABNORMAL HIGH (ref 0.61–1.24)
GFR calc Af Amer: 29 mL/min — ABNORMAL LOW (ref >60)
GFR calc non Af Amer: 25 mL/min — ABNORMAL LOW (ref >60)
Glucose, Bld: 156 mg/dL — ABNORMAL HIGH (ref 70–99)
Potassium: 4.6 mmol/L (ref 3.5–5.1)
Sodium: 138 mmol/L (ref 135–145)

## 2020-01-11 LAB — CULTURE, RESPIRATORY W GRAM STAIN

## 2020-01-11 LAB — CBC
HCT: 28.6 % — ABNORMAL LOW (ref 39.0–52.0)
Hemoglobin: 8.4 g/dL — ABNORMAL LOW (ref 13.0–17.0)
MCH: 26.5 pg (ref 26.0–34.0)
MCHC: 29.4 g/dL — ABNORMAL LOW (ref 30.0–36.0)
MCV: 90.2 fL (ref 80.0–100.0)
Platelets: 161 10*3/uL (ref 150–400)
RBC: 3.17 MIL/uL — ABNORMAL LOW (ref 4.22–5.81)
RDW: 17.4 % — ABNORMAL HIGH (ref 11.5–15.5)
WBC: 6.6 10*3/uL (ref 4.0–10.5)
nRBC: 0 % (ref 0.0–0.2)

## 2020-01-11 LAB — MAGNESIUM: Magnesium: 1.9 mg/dL (ref 1.7–2.4)

## 2020-01-11 LAB — CARBAPENEM RESISTANCE PANEL
Carba Resistance IMP Gene: NOT DETECTED
Carba Resistance KPC Gene: DETECTED — AB
Carba Resistance NDM Gene: NOT DETECTED
Carba Resistance OXA48 Gene: NOT DETECTED
Carba Resistance VIM Gene: NOT DETECTED

## 2020-01-11 LAB — GLUCOSE, CAPILLARY
Glucose-Capillary: 145 mg/dL — ABNORMAL HIGH (ref 70–99)
Glucose-Capillary: 142 mg/dL — ABNORMAL HIGH (ref 70–99)
Glucose-Capillary: 129 mg/dL — ABNORMAL HIGH (ref 70–99)
Glucose-Capillary: 157 mg/dL — ABNORMAL HIGH (ref 70–99)

## 2020-01-11 LAB — PROCALCITONIN: Procalcitonin: 1.93 ng/mL

## 2020-01-11 MED ORDER — OXYCODONE HCL 5 MG PO TABS
5.0000 mg | ORAL_TABLET | Freq: Three times a day (TID) | ORAL | Status: DC | PRN
  Administered 2020-01-12 (×2): 5 mg via ORAL
  Filled 2020-01-11 (×2): qty 1

## 2020-01-11 NOTE — Progress Notes (Signed)
Daily Progress Note   Patient Name: Shawn [name]       Date: 01/11/2020 DOB: Aug 06, 1985  Age: 34 y.o. MRN#: [id number] Attending Physician: Enzo Bi, MD Primary Care Physician: Valera Castle, MD Admit Date: 12/29/2019  Reason for Consultation/Follow-up: Establishing goals of care  Subjective: Patient is sitting in bed. He states he is feeling better. He states he has considered his care and wants all care indicated at this time. Recommend palliative to follow outpatient.  Length of Stay: 13  Current Medications: Scheduled Meds:   amLODipine  10 mg Oral Daily   ascorbic acid  250 mg Oral BID   calcium-vitamin D  1 tablet Oral BID WC   carvedilol  25 mg Oral BID WC   Chlorhexidine Gluconate Cloth  6 each Topical Daily   collagenase   Topical Daily   epoetin (EPOGEN/PROCRIT) injection  4,000 Units Intravenous Q T,Th,Sa-HD   famotidine  20 mg Oral Daily   ferrous sulfate  325 mg Oral BID WC   folic acid  1 mg Oral Daily   gabapentin  300 mg Oral BID   hydrALAZINE  25 mg Oral Q8H   insulin aspart  0-6 Units Subcutaneous Q4H   irbesartan  300 mg Oral Daily   lacosamide  100 mg Oral BID   levETIRAcetam  500 mg Oral BID   metoCLOPramide  5 mg Oral TID AC   mirtazapine  30 mg Oral QHS   pantoprazole  40 mg Oral Daily   QUEtiapine  25 mg Oral QHS   sevelamer carbonate  800 mg Oral TID WC   vancomycin  125 mg Oral QID    Continuous Infusions:  dextrose Stopped (01/08/20 0842)    PRN Meds: acetaminophen, ALPRAZolam, alteplase, heparin, hydrALAZINE, ipratropium-albuterol, lidocaine (PF), lidocaine-prilocaine, loperamide, ondansetron (ZOFRAN) IV, oxyCODONE, pentafluoroprop-tetrafluoroeth, polyethylene glycol  Physical Exam Pulmonary:     Effort:  Pulmonary effort is normal.  Neurological:     Mental Status: He is alert.             Vital Signs: BP (!) 177/88    Pulse 79    Temp 97.7 F (36.5 C) (Oral)    Resp 16    Ht 5' 5.98" (1.676 m)    Wt 63.7 kg    SpO2 93%    BMI  22.68 kg/m  SpO2: SpO2: 93 % O2 Device: O2 Device: Nasal Cannula O2 Flow Rate: O2 Flow Rate (L/min): 3 L/min  Intake/output summary:   Intake/Output Summary (Last 24 hours) at 01/11/2020 1239 Last data filed at 01/11/2020 1000 Gross per 24 hour  Intake 472 ml  Output 1000 ml  Net -528 ml   LBM: Last BM Date: 01/10/20 Baseline Weight: Weight: 52.2 kg Most recent weight: Weight: 63.7 kg        Flowsheet Rows     Most Recent Value  Intake Tab  Referral Department Hospitalist  Unit at Time of Referral Med/Surg Unit  Palliative Care Primary Diagnosis Nephrology  Date Notified 01/02/20  Palliative Care Type New Palliative care  Reason for referral Clarify Goals of Care  Date of Admission 12/29/19  Date first seen by Palliative Care 01/04/20  # of days Palliative referral response time 2 Day(s)  # of days IP prior to Palliative referral 4  Clinical Assessment  Psychosocial & Spiritual Assessment  Palliative Care Outcomes      Patient Active Problem List   Diagnosis Date Noted   Decubitus ulcer of coccyx, unspecified pressure ulcer stage 01/01/2020   End stage renal disease on dialysis ([place]) 01/01/2020   Cavitary lesion of lung 01/01/2020   Necrotizing pneumonia ([place]) 01/01/2020   Acute respiratory failure with hypoxia ([place])    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 ([place]) 10/18/2018   Hyperglycemia 10/11/2018   Pressure injury of skin 09/20/2018   Uncontrolled diabetes mellitus ([place]) 09/19/2018   Neurogenic pain 09/13/2018   Marijuana use 09/13/2018   Long term prescription benzodiazepine use 09/13/2018   Insulin-dependent diabetes mellitus with renal complications 94/49/6759   Vitamin D deficiency 08/21/2018    Hypocalcemia 08/21/2018   CKD stage G3a/A1, GFR 45-59 and albumin creatinine ratio <30 mg/g 08/21/2018   History of acute renal failure 08/21/2018   HHNC (hyperglycemic hyperosmolar nonketotic coma) ([place]) 08/16/2018   Chronic foot pain (Primary Area of Pain) (Bilateral) (L>R) 08/14/2018   Chronic lower extremity pain (Secondary Area of Pain) (Bilateral) (L>R) 08/14/2018   Chronic generalized abdominal pain (Tertiary Area of Pain) 08/14/2018   Chronic pain syndrome 08/14/2018   Long term current use of opiate analgesic 08/14/2018   Pharmacologic therapy 08/14/2018   Disorder of skeletal system 08/14/2018   Problems influencing health status 08/14/2018   Intractable vomiting with nausea 07/16/2018   IV infusion line dysfunction ([place]) 07/09/2018   Hyperglycemic hyperosmolar nonketotic coma ([place]) 05/28/2018   Acute renal failure (ARF) ([place]) 04/27/2018   Left foot infection 04/05/2018   Foot infection 02/06/2018   Local infection of the skin and subcutaneous tissue, unspecified 02/06/2018   Hypertension associated with diabetes ([place]) 01/16/2018   Osteomyelitis ([place]) 01/10/2018   Cellulitis 12/29/2017   Sepsis ([place]) 12/18/2017   Moderate recurrent major depression ([place]) 11/10/2017   Type 2 diabetes mellitus with hyperosmolar nonketotic hyperglycemia ([place]) 11/09/2017   History of migraine 07/15/2017   IBS (irritable bowel syndrome) 07/15/2017   Protein-calorie malnutrition, severe 07/14/2017   Abdominal pain 07/13/2017   Diarrhea 06/01/2013   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   Recommend palliative at D/C.      Code Status:    Code Status Orders  (From  admission, onward)  Start     Ordered   12/29/19 2320  Full code  Continuous        12/29/19 2320        Code Status History    Date Active Date Inactive Code Status Order ID Comments User Context   10/18/2018 1242 11/02/2018 0144 Full Code [id number]  Katha Cabal, MD Inpatient   10/11/2018 1157 10/13/2018 1654 Full Code [id number]  Hillary Bow, MD Inpatient   09/19/2018 2049 09/21/2018 1851 Full Code [id number]  Saundra Shelling, MD Inpatient   08/16/2018 1203 08/18/2018 2142 Full Code [id number]  Hillary Bow, MD ED   07/16/2018 1739 07/18/2018 0104 Full Code [id number]  Dustin Flock, MD Inpatient   07/10/2018 0111 07/11/2018 1955 Full Code [id number]  Salary, Avel Peace, MD Inpatient   06/14/2018 1744 06/20/2018 2123 Full Code [id number]  Loletha Grayer, MD ED   05/28/2018 0437 05/28/2018 1926 Full Code [id number]  Arta Silence, MD ED   04/27/2018 1726 05/02/2018 1713 Full Code [id number]  Henreitta Leber, MD Inpatient   04/05/2018 1724 04/11/2018 2142 Full Code [id number]  Dustin Flock, MD Inpatient   02/06/2018 2022 02/07/2018 1512 Full Code [id number]  Demetrios Loll, MD Inpatient   01/10/2018 2259 01/11/2018 1947 Full Code [id number]  Amelia Jo, MD Inpatient   12/30/2017 0019 01/03/2018 1457 Full Code [id number]  Harrie Foreman, MD Inpatient   12/20/2017 1541 12/27/2017 2147 Full Code [id number]  Sharlotte Alamo, [facility] Inpatient   11/10/2017 0049 11/11/2017 1632 Full Code [id number]  Amelia Jo, MD Inpatient   07/13/2017 2112 07/15/2017 1730 Full Code [id number]  Jules Husbands, MD ED   Advance Care Planning Activity       Prognosis:  Poor overall   Thank you for allowing the Palliative Medicine Team to assist in the care of this patient.   Total Time 15 min Prolonged Time Billed  no       Greater than 50%  of this time was spent counseling and coordinating care related to the above assessment and plan.  Asencion Gowda, NP  Please contact Palliative Medicine Team phone at  [phone] for questions and concerns.

## 2020-01-11 NOTE — Progress Notes (Signed)
[place] attempted visit per OR for AD.  Pt. lying down in bed --> requests chaplain visit later today.    01/11/20 1200  Clinical Encounter Type  Visited With Patient  Visit Type Follow-up  Referral From Nurse  Consult/Referral To Chaplain

## 2020-01-11 NOTE — Progress Notes (Signed)
SATURATION QUALIFICATIONS: (This note is used to comply with regulatory documentation for home oxygen)  Patient Saturations on Room Air at Rest = 63%  Patient Saturations on Room Air while Ambulating = unable to ambulate  Patient Saturations on 3 Liters of oxygen while resting = 94%

## 2020-01-11 NOTE — Progress Notes (Addendum)
PROGRESS NOTE    Shawn [name]  EXN:[id number] DOB: 1985/11/29 DOA: 12/29/2019 PCP: Valera Castle, MD    Assessment & Plan:   Active Problems:   MDD (major depressive disorder)   Intractable vomiting with nausea   Diarrhea   Chronic foot pain (Primary Area of Pain) (Bilateral) (L>R)   Chronic pain syndrome   Long term current use of opiate analgesic   Acute respiratory failure with hypoxia (HCC)   Decubitus ulcer of coccyx, unspecified pressure ulcer stage   End stage renal disease on dialysis [facility])   Cavitary lesion of lung   Necrotizing pneumonia ([place])    34 y.o. male with PMH of ESRD on HD admitted 12/29/19 due to Acute Hypoxic Respiratory Failure in the setting of Pulmonary edema/Volume Overload and questionable Necrotizing Pneumonia requiring BiPAP.  Nephrology was consulted for urgent HD. Overnight 7/2 had breakthrough seizures and transferred to stepdown, transfer back to floor on 7/3.  On 7/4 found unresponsive with respiratory arrest requiring intubation and transfer to ICU. Transferred out of ICU on 7/7.   Persistent Diarrhea, POA C diff colitis, POA --C diff antigen pos, toxin neg on presentation, didn't treat initially due to neg toxin, but given persistent diarrhea, will start oral vanc on 7/7 --diarrhea improved after starting oral vanc PLAN: --continue oral vanc for a 10-day course  # Chronic respiratory failure 2/2 lung injuries from # recurrent pleural effusion, pulm edema, repeated PNA # Hx of repeated intubation and tracheostomy, Ventilator associated pneumonia # Atelectatic change in both lungs from chronic bed-bound status --Over the past several years, pt has had repeated lung injuries due to above, and has imaging studies documenting the above findings both in our hospital and OSH.  Pt again presented with severe multi-focal PNA during this admission, and has not been able to be weaned off supplemental O2 and will likely remain on supplemental O2  indefinitely.  Pt needs to be discharged with 3L O2 for home use.  Severe acute hypoxemic respiratory failure  -Respiratory Arrest on 01/06/20 due to Large left pleural effusion and complete compressive atelectasis of LLL -recent hx Necrotizing Pneumonia -liberated from MV 01/07/20 -chest tube removed 01/07/20 -Hemodialysis for volume removal --wean O2 as tolerated (currently 3L) --O2 requirement due to recent severe PNA and lung injuries and will likely take more time to resolve.  Pt will need to be discharged on 3L supplemental O2.  Possible persistent Necrotizing Pneumonia -Monitor fever curve -Trend WBCs and procalcitonin-within referece range.  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   --Discussed with ID oncall Dr. Linus Salmons who agreed no need to treat   Moderate protein-calorie malnutrition Dietary consultation Bitemporal and peripheral muscle wasting   Hyponatremia  -euvolemic post D10W  -d/c MIVF salt tabs x 1 day tid  Seizure Activity - Likely breakthrough seizures due to non-compliant with seizure meds in addition to being on medication that could potentially lower seizure threshold (Levaquin + Zosyn) -Continue home Keppra and Vimpat -Seizure precautions -As needed Ativan for breakthrough seizure -Neurology following, appreciate input   ESRD on HD (T,Th,S) -Monitor I&O's / urinary output -Follow BMP -Ensure adequate renal perfusion -Avoid nephrotoxic agents as able -Replace electrolytes as indicated -Nephrology following, appreciate input -Hemodialysis as per nephrology   Anemia of Chronic Disease -Monitor for S/Sx of bleeding -Trend CBC -SQ Heparin for VTE Prophylaxis  -Transfuse for  Hgb <7  Diabetes Mellitus -CBGs -SSI -Follow ICU hypo/hyperglycemia  protocol   Persistent Nausea and vomiting RJ:JOACZYSA Gastroparesis -prn zofran -avoid phenergan--makes him groggy -Reglan IV 5 mg tid  Unstageable coccyx wound, Present on admission -Turn and reposition every two hours -Wound specialist following with dressing changes daily.    DVT prophylaxis: SCD/Compression stockings Code Status: Full code Palliative care following Family Communication:  Status is: inpatient Dispo:   The patient is from: home Anticipated d/c is to: home Anticipated d/c date is: whenever pt can receive O2 to go home with.  Patient currently is medically stable to d/c if he can have 3L O2 at home.   Subjective and Interval History:  No more diarrhea.  Pt appeared to be sleepy most of the time.  No complaints.  Nursing noted pt desat on RA.  Discussed case with ID oncall on the phone.  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   Objective: Vitals:   01/10/20 1805 01/10/20 1820 01/10/20 1841 01/10/20 1908  BP: (!) 171/86 (!) 180/85  (!) 177/88  Pulse:      Resp:      Temp:   97.7 F (36.5 C)   TempSrc:   Oral   SpO2:      Weight:   63.7 kg   Height:        Intake/Output Summary (Last 24 hours) at 01/11/2020 1608 Last data filed at 01/11/2020 1000 Gross per 24 hour  Intake 472 ml  Output 1000 ml  Net -528 ml   Filed Weights   01/10/20 0500 01/10/20 1500 01/10/20 1841  Weight: 64.1 kg 65.1 kg 63.7 kg    Examination:   Constitutional: NAD, oriented, sleepy but arousable HEENT: conjunctivae and lids normal, EOMI CV: RRR no M,R,G. Distal pulses +2.  No cyanosis.   RESP: crackles bilaterally, normal respiratory effort, on 3L GI: +BS, NTND Extremities: No effusions, edema, or tenderness in LLE.  Right BKA. SKIN: warm, dry and intact Neuro: II - XII grossly intact.  Sensation intact   Data Reviewed: I have personally reviewed following labs and imaging studies  CBC: Recent  Labs  Lab 01/06/20 0003 01/06/20 0003 01/07/20 0435 01/08/20 0538 01/09/20 0415 01/10/20 0521 01/11/20 0710  WBC 4.3   < > 4.1 4.9 5.1 6.7 6.6  NEUTROABS 3.1  --  2.1  --   --   --   --   HGB 8.3*   < > 9.8* 9.5* 8.8* 8.8* 8.4*  HCT 28.5*   < > 29.4* 29.5* 28.7* 29.5* 28.6*  MCV 91.3   < > 81.2 82.6 86.2 89.4 90.2  PLT 137*   < > 151 146* 144* 148* 161   < > = values in this interval not displayed.   Basic Metabolic Panel: Recent Labs  Lab 01/05/20 0926 01/05/20 6301 01/06/20 0003 01/06/20 0003 01/07/20 0435 01/08/20 0538 01/09/20 0415 01/10/20 0521 01/11/20 0710  NA 129*   < > 133*   < > 131* 125* 139 139 138  K 4.4   < > 4.1   < > 4.0 4.2 3.9 4.6 4.6  CL 91*   < > 95*   < > 95* 89* 102 104 101  CO2 26   < > 26   < > 22 22 28 25 27   GLUCOSE 133*   < > 145*   < > 93 153* 161* 140* 156*  BUN 38*   < > 27*   < >  34* 38* 20 35* 28*  CREATININE 4.51*   < > 3.19*   < > 4.02* 4.80* 2.88* 3.90* 3.11*  CALCIUM 8.2*   < > 8.4*   < > 8.2* 8.0* 7.9* 8.3* 8.3*  MG  --   --  1.5*  --   --  1.5* 2.0 2.1 1.9  PHOS 5.8*  --   --   --  4.1 5.4* 3.7  --   --    < > = values in this interval not displayed.   GFR: Estimated Creatinine Clearance: 30.7 mL/min (A) (by C-G formula based on SCr of 3.11 mg/dL (H)). Liver Function Tests: Recent Labs  Lab 01/05/20 0926 01/06/20 0003 01/07/20 0435 01/08/20 0538 01/09/20 0415  AST  --  13*  --   --   --   ALT  --  10  --   --   --   ALKPHOS  --  77  --   --   --   BILITOT  --  0.7  --   --   --   PROT  --  6.2*  --   --   --   ALBUMIN 2.8* 2.5* 2.3* 2.2* 2.2*   No results for input(s): LIPASE, AMYLASE in the last 168 hours. No results for input(s): AMMONIA in the last 168 hours. Coagulation Profile: No results for input(s): INR, PROTIME in the last 168 hours. Cardiac Enzymes: No results for input(s): CKTOTAL, CKMB, CKMBINDEX, TROPONINI in the last 168 hours. BNP (last 3 results) No results for input(s): PROBNP in the last 8760  hours. HbA1C: No results for input(s): HGBA1C in the last 72 hours. CBG: Recent Labs  Lab 01/10/20 2312 01/11/20 0550 01/11/20 0814 01/11/20 1212 01/11/20 1600  GLUCAP 179* 145* 142* 129* 157*   Lipid Profile: No results for input(s): CHOL, HDL, LDLCALC, TRIG, CHOLHDL, LDLDIRECT in the last 72 hours. Thyroid Function Tests: No results for input(s): TSH, T4TOTAL, FREET4, T3FREE, THYROIDAB in the last 72 hours. Anemia Panel: No results for input(s): VITAMINB12, FOLATE, FERRITIN, TIBC, IRON, RETICCTPCT in the last 72 hours. Sepsis Labs: Recent Labs  Lab 01/06/20 0003 01/11/20 0710  PROCALCITON 2.18 1.93  LATICACIDVEN 0.6  --     Recent Results (from the past 240 hour(s))  Body fluid culture     Status: None   Collection Time: 01/06/20  3:08 PM   Specimen: Body Fluid  Result Value Ref Range Status   Specimen Description   Final    FLUID Performed at [facility], [address] [place], [place] [address]    Special Requests   Final    NONE Performed at [facility], [address] [place], [place] [address]    Gram Stain   Final    RARE WBC PRESENT, PREDOMINANTLY MONONUCLEAR NO ORGANISMS SEEN    Culture   Final    NO GROWTH 3 DAYS Performed at [facility], [place] [address] [place], [place] [address]    Report Status 01/09/2020 FINAL  Final  Acid Fast Smear (AFB)     Status: None   Collection Time: 01/06/20  3:08 PM   Specimen: Pleural, Left; Pleural Fluid  Result Value Ref Range Status   AFB Specimen Processing Concentration  Final   Acid Fast Smear Negative  Final    Comment: (NOTE) Performed At: [facility] [address] [place], [place] [id number] Rush Farmer MD KA:[id number]    Source (AFB) PLEURAL  Final  Comment: Performed at [facility], [address] [place], [place] [address]  Culture, respiratory (non-expectorated)     Status: None   Collection Time: 01/07/20 11:19 AM   Specimen: Tracheal  Aspirate; Respiratory  Result Value Ref Range Status   Specimen Description   Final    TRACHEAL ASPIRATE Performed at [facility], [address] [place], [place] [address]    Special Requests   Final    NONE Performed at [facility], [address] [place], [place] [address]    Gram Stain   Final    RARE WBC PRESENT, PREDOMINANTLY PMN RARE GRAM NEGATIVE RODS    Culture   Final    FEW ENTEROBACTER CLOACAE CONFIRMED CARBAPENEMASE RESISTANT ENTEROBACTERIACAE MULTI-DRUG RESISTANT ORGANISM RESULT CALLED TO, READ BACK BY AND VERIFIED WITHRobbie Louis [phone] Performed at [facility], [place] [address], [place], [place] [address]    Report Status 01/11/2020 FINAL  Final   Organism ID, Bacteria ENTEROBACTER CLOACAE  Final      Susceptibility   Enterobacter cloacae - MIC*    CEFAZOLIN >=64 RESISTANT Resistant     CEFEPIME >=32 RESISTANT Resistant     CEFTAZIDIME >=64 RESISTANT Resistant     CIPROFLOXACIN >=4 RESISTANT Resistant     GENTAMICIN >=16 RESISTANT Resistant     IMIPENEM >=16 RESISTANT Resistant     TRIMETH/SULFA >=320 RESISTANT Resistant     PIP/TAZO >=128 RESISTANT Resistant     * FEW ENTEROBACTER CLOACAE  Carbapenem Resistance Panel     Status: Abnormal   Collection Time: 01/07/20 11:19 AM  Result Value Ref Range Status   Carba Resistance IMP Gene NOT DETECTED NOT DETECTED Final   Carba Resistance VIM Gene NOT DETECTED NOT DETECTED Final   Carba Resistance NDM Gene NOT DETECTED NOT DETECTED Final   Carba Resistance KPC Gene DETECTED (A) NOT DETECTED Final    Comment: RESULT CALLED TO, READ BACK BY AND VERIFIED WITH: RN JUAN R. [id number] FCP    Carba Resistance OXA48 Gene NOT DETECTED NOT DETECTED Final    Comment: (NOTE) Cepheid Carba-R is an FDA-cleared nucleic acid amplification test  (NAAT)for the detection and differentiation of genes encoding the  most prevalent carbapenemases in bacterial isolate samples. Carbapenemase  gene identification and implementation of comprehensive  infection control measures are recommended by the CDC to prevent the  spread of the resistant organisms. Performed at [facility], [place] [address], [place], [place] [address]       Radiology Studies: No results found.   Scheduled Meds: . amLODipine  10 mg Oral Daily  . ascorbic acid  250 mg Oral BID  . calcium-vitamin D  1 tablet Oral BID WC  . carvedilol  25 mg Oral BID WC  . Chlorhexidine Gluconate Cloth  6 each Topical Daily  . collagenase   Topical Daily  . epoetin (EPOGEN/PROCRIT) injection  4,000 Units Intravenous Q T,Th,Sa-HD  . famotidine  20 mg Oral Daily  . ferrous sulfate  325 mg Oral BID WC  . folic acid  1 mg Oral Daily  . gabapentin  300 mg Oral BID  . hydrALAZINE  25 mg Oral Q8H  . insulin aspart  0-6 Units Subcutaneous Q4H  . irbesartan  300 mg Oral Daily  . lacosamide  100 mg Oral BID  . levETIRAcetam  500 mg Oral BID  . metoCLOPramide  5 mg Oral TID AC  . mirtazapine  30 mg Oral QHS  .  pantoprazole  40 mg Oral Daily  . QUEtiapine  25 mg Oral QHS  . sevelamer carbonate  800 mg Oral TID WC  . vancomycin  125 mg Oral QID   Continuous Infusions:    LOS: 13 days     Enzo Bi, MD [facility] If 7PM-7AM, please contact night-coverage 01/11/2020, 4:08 PM

## 2020-01-11 NOTE — TOC Progression Note (Signed)
Transition of Care [facility]) - Progression Note    Patient Details  Name: Shawn [name] MRN: [id number] Date of Birth: [birth year]  Transition of [facility]) CM/SW Contact  Beverly Sessions, RN Phone Number: 01/11/2020, 5:45 PM  Clinical Narrative:    Anticipated discharge for tomorrow Patient with qualifying sats for home O2 [employer] was unable to provide O2 for this patient Referral made and accepted by Brenton Grills from [facility], he is aware that the patient is to discharge tomorrow  Patient will require oral vanc at discharge.  Once medication is prescribed TOC to call pharmacy confirm they have the quantity needed, and check cost  MD followed up with patients father to address his concerns.  Father inquired about short term rehab placement to MD. Patient does not have a skilled need and would not qualify for short term rehab.  Patient would qualify for long term care placement under his Medicaid however patient has declined the options.  MD has updated the father   Expected Discharge Plan: Home/Self Care Barriers to Discharge: Continued Medical Work up  Expected Discharge Plan and Services Expected Discharge Plan: Home/Self Care   Discharge Planning Services: CM Consult   Living arrangements for the past 2 months: Single Family Home                 DME Arranged: Oxygen DME Agency:  [facility] Aver) Date DME Agency Contacted: 01/11/20   Representative spoke with at DME Agency: [place] ([place]) Interventions    Readmission Risk Interventions Readmission Risk Prevention Plan 01/02/2020 10/13/2018 09/21/2018  Transportation Screening Complete Complete Complete  Medication Review Press photographer) Complete Complete Complete  PCP or Specialist appointment within 3-5 days of discharge - Complete Complete  HRI or [place] - Complete Complete  SW Recovery Care/Counseling Consult - (No Data) Complete  Palliative Care Screening - Not  Applicable Not Applicable  Skilled Nursing Facility - Not Applicable Not Applicable  Some recent data might be hidden

## 2020-01-11 NOTE — Progress Notes (Signed)
[place] attempted visit twice today per OR for AD education/completion.  Both times pt. said he feels too tired --> requests visit tomorrow.  CH will pass along referral to evening chaplain.    01/11/20 1300  Clinical Encounter Type  Visited With Patient not available  Visit Type Initial  Referral From Nurse

## 2020-01-12 LAB — GLUCOSE, CAPILLARY
Glucose-Capillary: 123 mg/dL — ABNORMAL HIGH (ref 70–99)
Glucose-Capillary: 134 mg/dL — ABNORMAL HIGH (ref 70–99)
Glucose-Capillary: 139 mg/dL — ABNORMAL HIGH (ref 70–99)
Glucose-Capillary: 147 mg/dL — ABNORMAL HIGH (ref 70–99)
Glucose-Capillary: 151 mg/dL — ABNORMAL HIGH (ref 70–99)
Glucose-Capillary: 188 mg/dL — ABNORMAL HIGH (ref 70–99)
Glucose-Capillary: 220 mg/dL — ABNORMAL HIGH (ref 70–99)
Glucose-Capillary: 238 mg/dL — ABNORMAL HIGH (ref 70–99)

## 2020-01-12 LAB — CBC
HCT: 30.1 % — ABNORMAL LOW (ref 39.0–52.0)
Hemoglobin: 8.9 g/dL — ABNORMAL LOW (ref 13.0–17.0)
MCH: 26.7 pg (ref 26.0–34.0)
MCHC: 29.6 g/dL — ABNORMAL LOW (ref 30.0–36.0)
MCV: 90.4 fL (ref 80.0–100.0)
Platelets: 182 10*3/uL (ref 150–400)
RBC: 3.33 MIL/uL — ABNORMAL LOW (ref 4.22–5.81)
RDW: 17.4 % — ABNORMAL HIGH (ref 11.5–15.5)
WBC: 8.9 10*3/uL (ref 4.0–10.5)
nRBC: 0 % (ref 0.0–0.2)

## 2020-01-12 LAB — MAGNESIUM: Magnesium: 1.9 mg/dL (ref 1.7–2.4)

## 2020-01-12 LAB — BASIC METABOLIC PANEL
Anion gap: 10 (ref 5–15)
BUN: 48 mg/dL — ABNORMAL HIGH (ref 6–20)
CO2: 27 mmol/L (ref 22–32)
Calcium: 8.6 mg/dL — ABNORMAL LOW (ref 8.9–10.3)
Chloride: 99 mmol/L (ref 98–111)
Creatinine, Ser: 3.94 mg/dL — ABNORMAL HIGH (ref 0.61–1.24)
GFR calc Af Amer: 22 mL/min — ABNORMAL LOW (ref >60)
GFR calc non Af Amer: 19 mL/min — ABNORMAL LOW (ref >60)
Glucose, Bld: 138 mg/dL — ABNORMAL HIGH (ref 70–99)
Potassium: 5.8 mmol/L — ABNORMAL HIGH (ref 3.5–5.1)
Sodium: 136 mmol/L (ref 135–145)

## 2020-01-12 MED ORDER — GABAPENTIN 300 MG PO CAPS: 300.0000 mg | ORAL_CAPSULE | Freq: Two times a day (BID) | ORAL | 0 refills | Status: AC

## 2020-01-12 MED ORDER — VANCOMYCIN HCL 125 MG PO CAPS: 125.0000 mg | ORAL_CAPSULE | Freq: Four times a day (QID) | ORAL | 0 refills | Status: AC

## 2020-01-12 MED ORDER — INSULIN GLARGINE 100 UNIT/ML ~~LOC~~ SOLN: SUBCUTANEOUS | 11 refills | Status: AC

## 2020-01-12 MED ORDER — FERROUS SULFATE 325 (65 FE) MG PO TABS: 325.0000 mg | ORAL_TABLET | Freq: Two times a day (BID) | ORAL | 0 refills | Status: AC

## 2020-01-12 MED ORDER — VIMPAT 100 MG PO TABS: 100.0000 mg | ORAL_TABLET | Freq: Two times a day (BID) | ORAL | 2 refills | Status: AC

## 2020-01-12 MED ORDER — LEVETIRACETAM 500 MG PO TABS: 500.0000 mg | ORAL_TABLET | Freq: Two times a day (BID) | ORAL | 2 refills | Status: AC

## 2020-01-12 MED ORDER — EPOETIN ALFA 10000 UNIT/ML IJ SOLN: 4000.0000 [IU] | INTRAMUSCULAR | Status: AC

## 2020-01-12 NOTE — Progress Notes (Signed)
EMS called for transportation.  

## 2020-01-12 NOTE — TOC Transition Note (Signed)
Transition of Care [facility]) - CM/SW Discharge Note   Patient Details  Name: Shawn [name] MRN: [id number] Date of Birth: 12/04/1985  [facility]) CM/SW Contact:  Harriet Masson, RN Phone [place] 01/12/2020, 5:29 PM   Clinical Narrative:    Medical necessity sheet completed for floor RN to call transportation upon pt's discharge   Final next level of care: Home/Self Care Barriers to Discharge: No Barriers Identified   Patient Goals and CMS Choice        Discharge Placement                       Discharge Plan and Services   Discharge Planning Services: CM Consult            DME Arranged: Oxygen DME Agency: Other - Comment Date DME Agency Contacted: 01/12/20   Representative spoke with at DME Agency: [place] ([place]) Interventions     Readmission Risk Interventions Readmission Risk Prevention Plan 01/02/2020 10/13/2018 09/21/2018  Transportation Screening Complete Complete Complete  Medication Review Press photographer) Complete Complete Complete  PCP or Specialist appointment within 3-5 days of discharge - Complete Complete  HRI or [place] - Complete Complete  SW Recovery Care/Counseling Consult - (No Data) Complete  Palliative Care Screening - Not Applicable Not Applicable  Skilled Nursing Facility - Not Applicable Not Applicable  Some recent data might be hidden

## 2020-01-12 NOTE — Progress Notes (Signed)
This note also relates to the following rows which could not be included: Pulse Rate - Cannot attach notes to unvalidated device data Resp - Cannot attach notes to unvalidated device data SpO2 - Cannot attach notes to unvalidated device data  HD assessment.

## 2020-01-12 NOTE — Progress Notes (Signed)
[facility]  ROUNDING NOTE   Subjective:  Patient seen and evaluated at bedside. Resting comfortably. Due for dialysis today.     Objective:  Vital signs in last 24 hours:  Temp:  [97.9 F (36.6 C)-98.1 F (36.7 C)] 98.1 F (36.7 C) (07/10 1147) Pulse Rate:  [71-76] 71 (07/10 1147) Resp:  [16-24] 16 (07/10 1147) BP: (132-148)/(81-94) 148/94 (07/10 1147) SpO2:  [94 %-95 %] 94 % (07/10 1147) Weight:  [65.5 kg] 65.5 kg (07/10 0426)  Weight change: 0.354 kg Filed Weights   01/10/20 1500 01/10/20 1841 01/12/20 0426  Weight: 65.1 kg 63.7 kg 65.5 kg    Intake/Output: I/O last 3 completed shifts: In: 1072 [P.O.:1072] Out: -    Intake/Output this shift:  No intake/output data recorded.  Physical Exam: General:  Chronically ill-appearing, laying in the bed  HEENT  moist oral mucous membranes  Pulm/lungs  scattered rhonchi, normal effort  CVS/Heart  S1S2 no rub or gallop  Abdomen:   Soft, nontender, BS present  Extremities:  + peripheral edema  Neurologic:  Able to follow commands and answer questions  Skin:  No acute rashes  Left IJ PermCath   Basic Metabolic Panel: Recent Labs  Lab 01/07/20 0435 01/07/20 0435 01/08/20 0538 01/08/20 0538 01/09/20 0415 01/09/20 0415 01/10/20 0521 01/11/20 0710 01/12/20 0805  NA 131*   < > 125*  --  139  --  139 138 136  K 4.0   < > 4.2  --  3.9  --  4.6 4.6 5.8*  CL 95*   < > 89*  --  102  --  104 101 99  CO2 22   < > 22  --  28  --  25 27 27   GLUCOSE 93   < > 153*  --  161*  --  140* 156* 138*  BUN 34*   < > 38*  --  20  --  35* 28* 48*  CREATININE 4.02*   < > 4.80*  --  2.88*  --  3.90* 3.11* 3.94*  CALCIUM 8.2*   < > 8.0*   < > 7.9*   < > 8.3* 8.3* 8.6*  MG  --   --  1.5*  --  2.0  --  2.1 1.9 1.9  PHOS 4.1  --  5.4*  --  3.7  --   --   --   --    < > = values in this interval not displayed.    Liver Function Tests: Recent Labs  Lab 01/06/20 0003 01/07/20 0435 01/08/20 0538 01/09/20 0415  AST 13*   --   --   --   ALT 10  --   --   --   ALKPHOS 77  --   --   --   BILITOT 0.7  --   --   --   PROT 6.2*  --   --   --   ALBUMIN 2.5* 2.3* 2.2* 2.2*   No results for input(s): LIPASE, AMYLASE in the last 168 hours. No results for input(s): AMMONIA in the last 168 hours.  CBC: Recent Labs  Lab 01/06/20 0003 01/06/20 0003 01/07/20 0435 01/07/20 0435 01/08/20 0538 01/09/20 0415 01/10/20 0521 01/11/20 0710 01/12/20 0805  WBC 4.3   < > 4.1   < > 4.9 5.1 6.7 6.6 8.9  NEUTROABS 3.1  --  2.1  --   --   --   --   --   --  HGB 8.3*   < > 9.8*   < > 9.5* 8.8* 8.8* 8.4* 8.9*  HCT 28.5*   < > 29.4*   < > 29.5* 28.7* 29.5* 28.6* 30.1*  MCV 91.3   < > 81.2   < > 82.6 86.2 89.4 90.2 90.4  PLT 137*   < > 151   < > 146* 144* 148* 161 182   < > = values in this interval not displayed.    Cardiac Enzymes: No results for input(s): CKTOTAL, CKMB, CKMBINDEX, TROPONINI in the last 168 hours.  BNP: Invalid input(s): POCBNP  CBG: Recent Labs  Lab 01/11/20 2035 01/12/20 0032 01/12/20 0417 01/12/20 0744 01/12/20 1144  GLUCAP 238* 188* 134* 123* 151*    Microbiology: Results for orders placed or performed during the hospital encounter of 12/29/19  SARS Coronavirus 2 by RT PCR (hospital order, performed in [facility] hospital lab) Nasopharyngeal Nasopharyngeal Swab     Status: None   Collection Time: 12/29/19 10:59 PM   Specimen: Nasopharyngeal Swab  Result Value Ref Range Status   SARS Coronavirus 2 NEGATIVE NEGATIVE Final    Comment: (NOTE) SARS-CoV-2 target nucleic acids are NOT DETECTED.  The SARS-CoV-2 RNA is generally detectable in upper and lower respiratory specimens during the acute phase of infection. The lowest concentration of SARS-CoV-2 viral copies this assay can detect is 250 copies / mL. A negative result does not preclude SARS-CoV-2 infection and should not be used as the sole basis for treatment or other patient management decisions.  A negative result may occur  with improper specimen collection / handling, submission of specimen other than nasopharyngeal swab, presence of viral mutation(s) within the areas targeted by this assay, and inadequate number of viral copies (<250 copies / mL). A negative result must be combined with clinical observations, patient history, and epidemiological information.  Fact Sheet for Patients:   StrictlyIdeas.no  Fact Sheet for Healthcare Providers: BankingDealers.co.za  This test is not yet approved or  cleared by the [place] FDA and has been authorized for detection and/or diagnosis of SARS-CoV-2 by FDA under an Emergency Use Authorization (EUA).  This EUA will remain in effect (meaning this test can be used) for the duration of the COVID-19 declaration under Section 564(b)(1) of the Act, 21 U.S.C. section 360bbb-3(b)(1), unless the authorization is terminated or revoked sooner.  Performed at [facility], [address] [place], [place] [address]   Blood culture (routine x 2)     Status: None   Collection Time: 12/29/19 10:59 PM   Specimen: BLOOD  Result Value Ref Range Status   Specimen Description BLOOD RIGHT ANTECUBITAL  Final   Special Requests   Final    BOTTLES DRAWN AEROBIC AND ANAEROBIC Blood Culture adequate volume   Culture   Final    NO GROWTH 5 DAYS Performed at [facility], [address] [place], [place] [address]    Report Status 01/03/2020 FINAL  Final  MRSA PCR Screening     Status: None   Collection Time: 12/30/19 12:25 AM   Specimen: Nasopharyngeal  Result Value Ref Range Status   MRSA by PCR NEGATIVE NEGATIVE Final    Comment:        The GeneXpert MRSA Assay (FDA approved for NASAL specimens only), is one component of a comprehensive MRSA colonization surveillance program. It is not intended to diagnose MRSA infection nor to guide or monitor treatment for MRSA infections. Performed at [facility], [place],  [place], [place] [address]   C Difficile Quick Screen w PCR reflex     Status: Abnormal   Collection Time: 12/30/19  1:50 PM   Specimen: STOOL  Result Value Ref Range Status   C Diff antigen POSITIVE (A) NEGATIVE Final   C Diff toxin NEGATIVE NEGATIVE Final   C Diff interpretation Results are indeterminate. See PCR results.  Final    Comment: Performed at [facility], [address] [place], [place] [address]  C. Diff by PCR, Reflexed     Status: Abnormal   Collection Time: 12/30/19  1:50 PM  Result Value Ref Range Status   Toxigenic C. Difficile by PCR POSITIVE (A) NEGATIVE Final    Comment: Positive for toxigenic C. difficile with little to no toxin production. Only treat if clinical presentation suggests symptomatic illness. Performed at [facility], [address], [place], [place] [address]   Body fluid culture     Status: None   Collection Time: 01/06/20  3:08 PM   Specimen: Body Fluid  Result Value Ref Range Status   Specimen Description   Final    FLUID Performed at [facility], [address] [place], [place] [address]    Special Requests   Final    NONE Performed at [facility], [address] [place], [place] [address]    Gram Stain   Final    RARE WBC PRESENT, PREDOMINANTLY MONONUCLEAR NO ORGANISMS SEEN    Culture   Final    NO GROWTH 3 DAYS Performed at [facility], [place] [address], [place], [place] [address]    Report Status 01/09/2020 FINAL  Final  Acid Fast Smear (AFB)     Status: None   Collection Time: 01/06/20  3:08 PM   Specimen: Pleural, Left; Pleural Fluid  Result Value Ref Range Status   AFB Specimen Processing Concentration  Final   Acid Fast Smear Negative  Final    Comment: (NOTE) Performed At: [facility] [place], [place] [id number] Rush Farmer MD LY:[id number]    Source (AFB) PLEURAL  Final    Comment: Performed at [facility], [address] [place], [place] [address]  Culture, respiratory (non-expectorated)     Status: None   Collection Time: 01/07/20 11:19 AM   Specimen: Tracheal Aspirate; Respiratory  Result Value Ref Range Status   Specimen Description   Final    TRACHEAL ASPIRATE Performed at [facility], [address] [place], [place] [address]    Special Requests   Final    NONE Performed at [facility], [address] [place], [place] [address]    Gram Stain   Final    RARE WBC PRESENT, PREDOMINANTLY PMN RARE GRAM NEGATIVE RODS    Culture   Final    FEW ENTEROBACTER CLOACAE CONFIRMED CARBAPENEMASE RESISTANT ENTEROBACTERIACAE MULTI-DRUG RESISTANT ORGANISM RESULT CALLED TO, READ BACK BY AND VERIFIED WITHRobbie Louis [id number] [phone] Performed at [facility], [place] [address] [place], [place] [address]    Report Status 01/11/2020 FINAL  Final   Organism ID, Bacteria ENTEROBACTER CLOACAE  Final      Susceptibility   Enterobacter cloacae - MIC*    CEFAZOLIN >=64 RESISTANT Resistant     CEFEPIME >=32 RESISTANT Resistant     CEFTAZIDIME >=64 RESISTANT Resistant     CIPROFLOXACIN >=4 RESISTANT Resistant     GENTAMICIN >=16 RESISTANT Resistant     IMIPENEM >=16 RESISTANT Resistant  TRIMETH/SULFA >=320 RESISTANT Resistant     PIP/TAZO >=128 RESISTANT Resistant     * FEW ENTEROBACTER CLOACAE  Carbapenem Resistance Panel     Status: Abnormal   Collection Time: 01/07/20 11:19 AM  Result Value Ref Range Status   Carba Resistance IMP Gene NOT DETECTED NOT DETECTED Final   Carba Resistance VIM Gene NOT DETECTED NOT DETECTED Final   Carba Resistance NDM Gene NOT DETECTED NOT DETECTED Final   Carba Resistance KPC Gene DETECTED (A) NOT DETECTED Final    Comment: RESULT CALLED TO, READ BACK BY AND VERIFIED WITH: RN JUAN R. [id number] FCP    Carba Resistance OXA48 Gene NOT DETECTED NOT DETECTED Final    Comment: (NOTE) Cepheid Carba-R is an FDA-cleared  nucleic acid amplification test  (NAAT)for the detection and differentiation of genes encoding the  most prevalent carbapenemases in bacterial isolate samples. Carbapenemase gene identification and implementation of comprehensive  infection control measures are recommended by the CDC to prevent the  spread of the resistant organisms. Performed at [facility], [place] [address], [place], [place] [address]     Coagulation Studies: No results for input(s): LABPROT, INR in the last 72 hours.  Urinalysis: No results for input(s): COLORURINE, LABSPEC, PHURINE, GLUCOSEU, HGBUR, BILIRUBINUR, KETONESUR, PROTEINUR, UROBILINOGEN, NITRITE, LEUKOCYTESUR in the last 72 hours.  Invalid input(s): APPERANCEUR    Imaging: No results found.   Medications:    . amLODipine  10 mg Oral Daily  . ascorbic acid  250 mg Oral BID  . calcium-vitamin D  1 tablet Oral BID WC  . carvedilol  25 mg Oral BID WC  . Chlorhexidine Gluconate Cloth  6 each Topical Daily  . collagenase   Topical Daily  . epoetin (EPOGEN/PROCRIT) injection  4,000 Units Intravenous Q T,Th,Sa-HD  . famotidine  20 mg Oral Daily  . ferrous sulfate  325 mg Oral BID WC  . folic acid  1 mg Oral Daily  . gabapentin  300 mg Oral BID  . hydrALAZINE  25 mg Oral Q8H  . insulin aspart  0-6 Units Subcutaneous Q4H  . irbesartan  300 mg Oral Daily  . lacosamide  100 mg Oral BID  . levETIRAcetam  500 mg Oral BID  . metoCLOPramide  5 mg Oral TID AC  . mirtazapine  30 mg Oral QHS  . pantoprazole  40 mg Oral Daily  . QUEtiapine  25 mg Oral QHS  . sevelamer carbonate  800 mg Oral TID WC  . vancomycin  125 mg Oral QID   acetaminophen, ALPRAZolam, alteplase, heparin, hydrALAZINE, ipratropium-albuterol, lidocaine (PF), lidocaine-prilocaine, loperamide, ondansetron (ZOFRAN) IV, oxyCODONE, pentafluoroprop-tetrafluoroeth, polyethylene glycol  Assessment/ Plan:  [name] is a 34 y.o. white male with end stage renal disease on  hemodialysis, hypertension, diabetes mellitus, GERD, IBS, osteomyelitis who was admitted to [facility] on 12/29/2019 for End stage renal disease on dialysis ([place]) [N18.6, Z99.2] Acute respiratory failure with hypoxia ([place]) [J96.01]   [facility] nephrology  1. End Stage renal disease:  Patient due for dialysis treatment today per usual schedule.  Also had dialysis yesterday.  2. Anemia of CKD Lab Results  Component Value Date   HGB 8.9 (L) 01/12/2020  Continue Epogen 4000 IV with dialysis treatments.  Hemoglobin currently 8.9 as above.  3.  Secondary Hyperparathyroidism:  - Calcium carbonate with meals.  Continue Renvela as well.  Phosphorus under good control. Lab Results  Component Value Date   CALCIUM 8.6 (L) 01/12/2020   PHOS  3.7 01/09/2020    4.  Seizure disorder - had breakthrough sz. Was seen by neurologist.  - currently on V[place] and [place].  5.  Large left pleural effusion, acute respiratory failure Chest tube placed July 4 with serosanguineous drainage.  1700 cc removed Chest tube removed on July 5. Multifocal bilateral pneumonia seen on chest x-ray  6.  C. difficile colitis Being treated with oral vancomycin        LOS: 14 Cleaster [place] 7/10/20211:56 PM

## 2020-01-12 NOTE — Discharge Summary (Addendum)
Physician Discharge Summary   Shawn [name]  male DOB: 1985/10/03  ZOX:[id number]  PCP: Valera Castle, MD  Admit date: 12/29/2019 Discharge date: 01/12/2020  Admitted From: home Disposition:  Home.  Pt did not qualify for SNF rehab since he was bed-bound/wheelchair-bound and not considered rehab-table.  Pt refused to go to a long-term SNF and wanted to go home.  Father, the caregiver, was worried about pt getting worse again at home, however, understood that pt could not stay in the hospital when currently stable and without inpatient needs.  Father updated on the phone the day prior to discharge about discharge plans.  CODE STATUS: Full code  Discharge Instructions    Discharge instructions   Complete by: As directed    You have C diff infection which was causing your diarrhea.  We have started you on oral Vancomycin for treatment and diarrhea has resolved.  Please continue to take oral Vancomycin 4 times per day for 8 more days at home.  You haven't required any long-acting insulin (Lantus) during this hospitalization, and your blood sugars have been within range.  Please hold Lantus until outpatient doctor followup.  Please just take short-acting insulin (Humalog) 2 units with each meals.  We have ordered 3 liters of oxygen for you to go home with.  For the time being, you need to be on oxygen at all times.  Please follow up with your outpatient doctor to see if/when you will not need oxygen in the future.  Please be sure to take your seizure medications every day as directed, Keppra and Vimpat.  I have refilled them for you.   Dr. Enzo Bi - -   Discharge wound care:   Complete by: As directed    Cleanse sacrum with NS and pat dry. Apply Santyl to open wound.  Cover with NS moist 2x2 and secure with silicone foam.  Change daily.  Turn and reposition every two hours.       Hospital Course:  For full details, please see H&P, progress notes, consult notes and  ancillary notes.  Briefly,  Shawn [name] is a 34 y.o. Caucasian male with PMH of ESRD on HD who was admitted on 12/29/19 due to Acute Hypoxic Respiratory Failure in the setting of Pulmonary edema/Volume Overload and Necrotizing Pneumonia requiring BiPAP. Pt was weaned off BiPAP and transitioned to [place] and transferred out to the floor on 6/28.  Overnight 7/2 pt had breakthrough seizures and transferred to stepdown, transfer back to floor on 7/3.   On 7/4 pt was found unresponsive with respiratory arrest requiring intubation and transfer to ICU. Transferred out of ICU on 7/7.  Acute on chronic Hypoxic Respiratory Failure  2/2 Pulmonary Edema/Volume Overload  2/2 Necrotizing Pneumonia 2/2 multifocal PNA Pt was started on IV vanc and zosyn.  IV vanc d/c'ed after MRSA screen returned neg.  Pt received 11 days of IV zosyn.    Severe acute hypoxemic respiratory failure  Pt had respiratory Arrest on 01/06/20 due to Large left pleural effusion and complete compressive atelectasis of LLL, requiring intubation and chest tube placement.  Pt was extubated and chest tube removed on 01/07/20.  Pt received dialysis for volume removal.  Pt's oxygen requirement was weaned down to 3L, however, remained at 3L for several days.  Pt was therefore discharged on 3L supplemental O2 for home use.  # Chronic respiratory failure 2/2 lung injuries from # recurrent pleural effusion, pulm edema, repeated PNA # Hx of repeated intubation  and tracheostomy, Ventilator associated pneumonia # Atelectatic change in both lungs from chronic bed-bound status Over the past several years, pt has had repeated lung injuries due to above, and has imaging studies documenting the above findings both in our hospital and OSH.  Pt again presented with severe multi-focal PNA during this admission, and has not been able to be weaned off supplemental O2 and will likely remain on supplemental O2 indefinitely.  Pt needs to be discharged with 3L O2  for home use.  Persistent Diarrhea, POA C diff colitis, POA C diff antigen pos, toxin neg on presentation, so didn't treat initially due to neg toxin, but given persistent diarrhea, started oral vanc on 7/7.  Diarrhea improved after starting oral vanc.  Pt was discharged on 8 more days of oral Vanc to complete a 10-day course.  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  Pt remained afebrile, without leukocytosis, with stable O2 requirement of 3L.  Discussed with ID oncall Dr. Linus Salmons who agreed no need to treat   Severe protein-calorie malnutrition Dietary consultation.  Bitemporal and peripheral muscle wasting  Hyponatremia, resolved  Seizure Activity- Likely breakthrough seizures due to non-compliant with seizure meds in addition to being on medication that could potentially lower seizure threshold (Levaquin + Zosyn).  Neuro consulted.  Continued home Keppra and Vimpat  ESRD on HD (T,Th,S) Hemodialysis as per nephrology  Anemia of Chronic Disease Hgb stable around 8.  Did not need blood transfusion.  Diabetes Mellitus A1c 7.4, a huge improvement from 13.7 a year ago.  Pt hadn't required any long-acting insulin during this hospitalization, and blood sugars had been within range.  Pt was advised to hold Lantus until outpatient doctor followup.  Pt was discharged on short-acting insulin (Humalog) 2 units with each meals.  Persistent Nausea and vomiting Hx of Diabetic Gastroparesis Pt received PRN zofran, and avoided phenergan as it made him groggy.   Pt received Reglan 5 mg tid during hospitalization.  Unstageable coccyx wound, Present on admission Turn and reposition every two hours.  Wound specialist following with dressing changes daily as instructed above.   Discharge Diagnoses:  Active Problems:   MDD (major depressive disorder)   Intractable vomiting with nausea   Diarrhea   Chronic foot pain (Primary  Area of Pain) (Bilateral) (L>R)   Chronic pain syndrome   Long term current use of opiate analgesic   Acute respiratory failure with hypoxia (HCC)   Decubitus ulcer of coccyx, unspecified pressure ulcer stage   End stage renal disease on dialysis ([place])   Cavitary lesion of lung   Necrotizing pneumonia [facility])    Discharge Instructions:  Allergies as of 01/12/2020      Reactions   Banana Hives, Nausea And Vomiting, Rash   Keflex [cephalexin] Rash   No swelling- also taken penicillin without any issue.   Sulfa Antibiotics Anaphylaxis   Grapeseed Extract [nutritional Supplements] Itching   Shellfish Allergy Hives   "ALL SEAFOOD"   Grape Seed Rash      Medication List    TAKE these medications   amLODipine 10 MG tablet Commonly known as: NORVASC Take 10 mg by mouth daily.   carvedilol 25 MG tablet Commonly known as: COREG Take 25 mg by mouth 2 (two) times daily with a meal.   epoetin alfa 10000 UNIT/ML injection Commonly known as: EPOGEN Inject 0.4 mLs (4,000 Units total) into the vein Every Tuesday,Thursday,and Saturday with dialysis.   ferrous sulfate 325 (65  FE) MG tablet Take 1 tablet (325 mg total) by mouth 2 (two) times daily with a meal.   folic acid 1 MG tablet Commonly known as: FOLVITE Take 1 mg by mouth daily.   gabapentin 300 MG capsule Commonly known as: NEURONTIN Take 1 capsule (300 mg total) by mouth 2 (two) times daily.   hydrALAZINE 25 MG tablet Commonly known as: APRESOLINE Take 25 mg by mouth every 8 (eight) hours.   insulin glargine 100 UNIT/ML injection Commonly known as: LANTUS Hold this long-acting insulin until outpatient doctor followup, because you have not received it in the hospital and your BG have not been within range with just the short-acting. What changed:   how much to take  how to take this  when to take this  additional instructions   insulin lispro 100 UNIT/ML injection Commonly known as: HUMALOG Inject 2 Units into  the skin 3 (three) times daily with meals.   levETIRAcetam 500 MG tablet Commonly known as: KEPPRA Take 1 tablet (500 mg total) by mouth 2 (two) times daily. Seizure medicine. What changed: additional instructions   metoCLOPramide 5 MG tablet Commonly known as: REGLAN Take 5 mg by mouth 3 (three) times daily before meals.   mirtazapine 30 MG tablet Commonly known as: REMERON Take 30 mg by mouth at bedtime.   oxyCODONE 5 MG immediate release tablet Commonly known as: Oxy IR/ROXICODONE Take 5 mg by mouth every 8 (eight) hours as needed for severe pain.   pantoprazole 40 MG tablet Commonly known as: PROTONIX Take 40 mg by mouth daily.   QUEtiapine 25 MG tablet Commonly known as: SEROQUEL Take 25 mg by mouth at bedtime.   sevelamer carbonate 800 MG tablet Commonly known as: RENVELA Take 800 mg by mouth 3 (three) times daily with meals.   valsartan 320 MG tablet Commonly known as: DIOVAN Take 320 mg by mouth daily.   vancomycin 125 MG capsule Commonly known as: Vancocin HCl Take 1 capsule (125 mg total) by mouth 4 (four) times daily for 8 days. For treatment of C diff infection.   Vimpat 100 MG Tabs Generic drug: Lacosamide Take 1 tablet (100 mg total) by mouth 2 (two) times daily. Seizure medicine. What changed: additional instructions            Discharge Care Instructions  (From admission, onward)         Start     Ordered   01/12/20 0000  Discharge wound care:       Comments: Cleanse sacrum with NS and pat dry. Apply Santyl to open wound.  Cover with NS moist 2x2 and secure with silicone foam.  Change daily.  Turn and reposition every two hours.   01/12/20 8841           Follow-up Information    Olmedo, Guy Begin, MD. Schedule an appointment as soon as possible for a visit in 1 week(s).   Specialty: Family Medicine Contact information: [place] [address] [phone]               Allergies  Allergen Reactions  .  Banana Hives, Nausea And Vomiting and Rash  . Keflex [Cephalexin] Rash    No swelling- also taken penicillin without any issue.  . Sulfa Antibiotics Anaphylaxis  . Grapeseed Extract [Nutritional Supplements] Itching  . Shellfish Allergy Hives    "ALL SEAFOOD"  . Grape Seed Rash     The results of significant diagnostics from this hospitalization (including imaging,  microbiology, ancillary and laboratory) are listed below for reference.   Consultations:   Procedures/Studies: DG Abd 1 View  Result Date: 01/06/2020 CLINICAL DATA:  Confirm orogastric tube placement. EXAM: ABDOMEN - 1 VIEW COMPARISON:  10/26/2018 FINDINGS: Orogastric tube has been placed, tip overlying the level of the mid stomach. Small bore pigtail catheter overlies the LEFT lung base. Bowel gas pattern is nonobstructive. IMPRESSION: Orogastric tube tip overlying the level of the mid stomach. Electronically Signed   By: Nolon Nations M.D.   On: 01/06/2020 20:46   CT HEAD WO CONTRAST  Result Date: 01/06/2020 CLINICAL DATA:  Altered mental status.  Post code. EXAM: CT HEAD WITHOUT CONTRAST TECHNIQUE: Contiguous axial images were obtained from the base of the skull through the vertex without intravenous contrast. COMPARISON:  CT 12/10/2019 FINDINGS: Brain: No intracranial hemorrhage, mass effect, or midline shift. No cerebral edema or loss of gray-white differentiation. No hydrocephalus. The basilar cisterns are patent. No evidence of territorial infarct or acute ischemia. No extra-axial or intracranial fluid collection. Vascular: No hyperdense vessel or unexpected calcification. Skull: No fracture or focal lesion. Sinuses/Orbits: Minimal opacification of right mastoid air cells may be related to intubation. The remaining paranasal sinuses are clear. Orbits are unremarkable. Other: None. IMPRESSION: Negative noncontrast head CT. Electronically Signed   By: Keith Rake M.D.   On: 01/06/2020 01:16   CT HEAD WO  CONTRAST  Result Date: 12/30/2019 CLINICAL DATA:  Altered mental status EXAM: CT HEAD WITHOUT CONTRAST TECHNIQUE: Contiguous axial images were obtained from the base of the skull through the vertex without intravenous contrast. COMPARISON:  None. FINDINGS: Brain: No acute intracranial abnormality. Specifically, no hemorrhage, hydrocephalus, mass lesion, acute infarction, or significant intracranial injury. Vascular: No hyperdense vessel or unexpected calcification. Skull: No acute calvarial abnormality. Sinuses/Orbits: Visualized paranasal sinuses and mastoids clear. Orbital soft tissues unremarkable. Other: None IMPRESSION: No acute intracranial abnormality. Electronically Signed   By: Rolm Baptise M.D.   On: 12/30/2019 23:29   CT Chest W Contrast  Result Date: 12/29/2019 CLINICAL DATA:  Chest pain, shortness of breath EXAM: CT CHEST WITH CONTRAST TECHNIQUE: Multidetector CT imaging of the chest was performed during intravenous contrast administration. CONTRAST:  43mL OMNIPAQUE IOHEXOL 300 MG/ML  SOLN COMPARISON:  Radiograph 12/29/2019, CT 07/16/2018 FINDINGS: Cardiovascular: Left IJ approach dual lumen dialysis catheter tip terminates at the of the right atrium. A small amount of gas along the catheter tract likely reflects recent placement of this access. There is mild cardiomegaly with predominantly right heart enlargement. Reflux of contrast into the IVC. Small volume pericardial effusion is present as well though this is similar in size to the comparison. The aortic root is suboptimally assessed given cardiac pulsation artifact. No acute luminal abnormality of the aorta. Normal 3 vessel branching of the aortic arch. Proximal great vessels are unremarkable. Central pulmonary arteries are mildly enlarged (aorta ratio 1.2). No large central filling defects on this non tailored examination of the pulmonary arteries with more distal evaluation precluded by motion artifact and poor contrast timing.  Mediastinum/Nodes: Diffuse edematous changes of the mesentery as well as fluid throughout the pericardial recesses. Stable numerous scattered mediastinal, hilar and bilateral axillary lymph nodes are present. Nodal examples include a 15 mm left axillary node (2/26, a 12 mm right paratracheal node (2/29), and a 12 mm right hilar node (5/67). Many of these appear low-attenuation in could be edematous and or reactive. Acute abnormality of the esophagus. Trachea is unremarkable. No concerning thyroid nodules or masses. Lungs/Pleura: There is  a moderate right and moderate to large left pleural effusion. No significant pleural thickening or enhancement is seen. The effusions do appear to extend into the fissures and with some mild left apical capping as well. Lobulation or septation is not fully excluded. There are extensive areas of consolidated lung, some of which is likely related to some passive atelectatic change however more dense areas of focal airspace disease are present throughout both lungs largely in a peribronchovascular distribution. There is extensive regions of architectural distortion as well best demonstrated by reticular changes in the left apex. Opacity in the right upper lobe demonstrates some peripheral cavitation as well (2/63). Areas of hypoattenuating lung parenchyma within these regions of consolidation further supported diagnosis of pneumonia. No pneumothorax. Additional areas of widespread tree-in-bud and ground-glass opacity are noted throughout both lungs. Interval development of multiple apical bulla/paraseptal emphysematous change not present on comparison from January 2020 Upper Abdomen: Heterogeneously enhancing liver with periportal edema, could reflect some hepatic congestion or volume overload. Mild gallbladder wall thickening is nonspecific in this given setting, possibly edematous though should correlate for right upper quadrant symptoms. No other acute abnormality in the upper  abdomen. Musculoskeletal: No acute osseous abnormality or suspicious osseous lesion. Diffuse body wall edema. IMPRESSION: 1. Extensive areas of consolidated lung, some of which is likely related to some passive atelectatic change. Areas of hypoattenuating lung parenchyma within these regions of consolidation further supported diagnosis of pneumonia. 2. There is an area of cavitation right upper lobe associated with a larger consolidative opacity, could reflect a necrotic pneumonia. 3. Moderate right and moderate to large left pleural effusion. Possibly parapneumonic and/or related to patient's volume status however no convincing CT evidence of empyema at this time. 4. Stable numerous scattered mediastinal, hilar and bilateral axillary lymph nodes, some of which appear low-attenuation, likely combination edematous and reactive. 5. Interval development of additional paraseptal emphysematous/bullous changes in the bilateral upper lobes since prior CT. 6. Left IJ approach dual lumen dialysis catheter tip terminates at the of the right atrium. A small amount of gas along the catheter tract likely compatible with the recent placement of this access. 7. Cardiomegaly right heart enlargement, pulmonary artery enlargement and reflux of contrast into the IVC. Findings could suggest pulmonary hypertension and elevated right heart pressure/right heart failure. 8. Additional features of anasarca/volume overload with diffuse body wall and mediastinal edematous changes. Periportal edema could suggest some hepatic congestion in the setting of right heart failure. 9. Mild gallbladder wall thickening, nonspecific possibly related to motion or the hepatic congestion. Correlate with right upper quadrant symptoms and consider right upper quadrant ultrasound if clinically warranted. These results were called by telephone at the time of interpretation on 12/29/2019 at 11:00 pm to provider Lenise Arena , who verbally acknowledged these  results. Electronically Signed   By: Lovena Le M.D.   On: 12/29/2019 23:01   CT ANGIO CHEST PE W OR WO CONTRAST  Result Date: 01/06/2020 CLINICAL DATA:  Respiratory failure. EXAM: CT ANGIOGRAPHY CHEST WITH CONTRAST TECHNIQUE: Multidetector CT imaging of the chest was performed using the standard protocol during bolus administration of intravenous contrast. Multiplanar CT image reconstructions and MIPs were obtained to evaluate the vascular anatomy. CONTRAST:  66mL OMNIPAQUE IOHEXOL 350 MG/ML SOLN COMPARISON:  Most recent radiograph 01/04/2020.  Chest CT 12/29/2019 FINDINGS: Cardiovascular: There are no central filling defects in the pulmonary arteries. Motion artifact as well as consolidative lung limits assessment. Evaluation is diagnostic to the lobar in some segmental branches. Again seen cardiomegaly.  No contrast refluxing into the IVC on the current exam. Left-sided dialysis catheter remains in place. No evidence of aortic dissection, allowing for mild motion artifact. Mediastinum/Nodes: Endotracheal tube above the carina. Edema throughout the mediastinum as well as scattered lymph nodes, not well assessed given motion, but grossly stable in the short interim. Left greater than right axillary adenopathy, partially obscured by motion but grossly similar to prior. Lungs/Pleura: Large left pleural effusion has progressed from prior exam. Complete compressive atelectasis and/or consolidation of the left lower lobe and partial atelectasis of the left upper lobe. Small right pleural effusion is similar. Improved consolidations in the right lower lobe from prior exam. Loculated air in fluid along the right minor fissure which appears similar to prior exam, difficult to exclude cavitary process. Generalized improvement in the tree in bud and ground-glass opacities in the right upper lobe with mild residual. No pneumothorax. Upper Abdomen: Dilated IVC likely passive congestion. Mild edema throughout the upper  abdominal fat. No definite change from prior exam. Musculoskeletal: Similar appearing anterior bilateral rib fractures from prior, likely recent but not acute. There is some surrounding callus formation. No sternal fracture. Generalized body wall edema. Review of the MIP images confirms the above findings. IMPRESSION: 1. No central pulmonary embolus. Motion artifact as well as consolidative lung limits assessment. 2. Large left pleural effusion has progressed from prior exam. Complete compressive atelectasis and/or consolidation of the left lower lobe and partial atelectasis of the left upper lobe. Small right pleural effusion is similar. 3. Probable cavitary process in the right mid lung versus loculated air and fluid in the minor fissure. This is unchanged from recent exam. 4. Generalized body wall edema. Dilated IVC likely passive congestion. 5. Recent but not acute anterior rib fractures. 6. Mediastinal and left greater than right axillary adenopathy, grossly similar allowing for motion. Electronically Signed   By: Keith Rake M.D.   On: 01/06/2020 01:12   DG Chest Port 1 View  Result Date: 01/08/2020 CLINICAL DATA:  Pleural effusion EXAM: PORTABLE CHEST 1 VIEW COMPARISON:  January 07, 2020 FINDINGS: Endotracheal tube has been removed. Nasogastric tube has been removed. Left chest drain has been removed. Dual lumen catheter again noted with the distal tip at the cavoatrial junction. There is no appreciable pneumothorax. There is a small right pleural effusion. There is atelectatic change in each mid lung. There is ill-defined airspace opacity in the medial left upper lobe. There is also ill-defined opacity in the right mid lung and right base. Heart is mildly enlarged with pulmonary vascularity normal. No adenopathy. No bone lesions IMPRESSION: Ill-defined opacity concerning for focal pneumonia left upper lobe medially. Slightly less well-defined airspace opacity right mid lung and right base. Atelectatic  change mid lung regions. Small right pleural effusion. Tube and catheter positions as described without appreciable pneumothorax. Electronically Signed   By: Lowella Grip III M.D.   On: 01/08/2020 09:00   DG Chest Port 1 View  Result Date: 01/07/2020 CLINICAL DATA:  34 year old male with history of respiratory failure. EXAM: PORTABLE CHEST 1 VIEW COMPARISON:  Chest x-ray 01/06/2020. FINDINGS: An endotracheal tube is in place with tip 5.5 cm above the carina. Left-sided internal jugular PermCath with tips terminating in the right atrium and at the superior cavoatrial junction. Small bore left-sided chest tube with pigtail reformed in the base of the left hemithorax. A nasogastric tube is seen extending into the stomach, however, the tip of the nasogastric tube extends below the lower margin of the image. Lung  volumes are slightly low. Patchy areas of interstitial prominence an ill-defined airspace disease in the lungs bilaterally, most evident in the right mid lung and in the apex of the left upper lobe concerning for multilobar pneumonia. No pleural effusions. No definite pneumothorax. No evidence of pulmonary edema. Heart size is upper limits of normal. Upper mediastinal contours are within normal limits. IMPRESSION: 1. Support apparatus, as above. 2. Multilobar bilateral pneumonia redemonstrated, as above. Electronically Signed   By: Vinnie Langton M.D.   On: 01/07/2020 12:00   DG Chest Port 1 View  Result Date: 01/06/2020 CLINICAL DATA:  LEFT chest tube placement.  Code last night. EXAM: PORTABLE CHEST 1 VIEW COMPARISON:  01/06/2020 at 1:30 a.m. FINDINGS: Patient has LEFT-sided dialysis catheter with tips overlying the superior vena cava. Endotracheal tube is in place, tip 5.1 centimeters above the carina. Interval placement of LEFT-sided chest tube. LEFT pleural effusion is significantly decreased. No pneumothorax. There has been slight improvement in aeration of the lungs bilaterally. There are  persistent bilateral patchy opacities. IMPRESSION: 1. Interval placement of LEFT-sided chest tube. 2. Improved aeration of the lungs bilaterally. Electronically Signed   By: Nolon Nations M.D.   On: 01/06/2020 15:20   DG Chest Port 1 View  Result Date: 01/06/2020 CLINICAL DATA:  Intubation. EXAM: PORTABLE CHEST 1 VIEW COMPARISON:  Chest CT earlier this day. FINDINGS: Endotracheal tube tip 6.1 cm from the carina. Left-sided dialysis catheter in place. Large left pleural effusion and compressive opacities. Right pleural effusion better assessed on recent CT. Cavitary process in the right mid lung again seen. Unchanged heart size. No other interval change. IMPRESSION: 1. Endotracheal tube tip 6.1 cm from the carina. 2. Large left pleural effusion, unchanged from CT earlier this day. 3. Right pleural effusion better assessed on recent CT. Presumed cavitary process in the right mid lung unchanged. Electronically Signed   By: Keith Rake M.D.   On: 01/06/2020 02:27   DG Chest Port 1 View  Result Date: 01/04/2020 CLINICAL DATA:  Shortness of breath. EXAM: PORTABLE CHEST 1 VIEW COMPARISON:  CT 12/29/2019.  Chest x-ray 12/29/2019. FINDINGS: Dual-lumen central catheter stable position. Cardiomegaly. Diffuse prominent bilateral pulmonary infiltrates/edema again noted. Cavitary lesion right upper lung again noted. Progression of left-sided pleural effusion. Stable right-sided pleural thickening. No pneumothorax. IMPRESSION: 1.  Dual-lumen catheter stable position. 2.  Stable cardiomegaly. 3. Diffuse prominent bilateral pulmonary infiltrates/edema again noted. Cavitary lesion right upper lung again noted. Progression of left-sided pleural effusion. Electronically Signed   By: Marcello Moores  Register   On: 01/04/2020 07:45   DG Chest Port 1 View  Result Date: 12/29/2019 CLINICAL DATA:  Unresponsiveness. EXAM: PORTABLE CHEST 1 VIEW COMPARISON:  10/31/2018 FINDINGS: There is a well-positioned tunneled dialysis catheter  on the left. The heart size is significantly enlarged and has increased in size from the prior study. There are coarse bilateral airspace opacities with suggestion of a new cavitary lesion in the right mid lung zone. There are probable small bilateral pleural effusions. No definite evidence for pneumothorax. There are few linear lucencies in the patient's low left neck which are favored to represent artifact. IMPRESSION: 1. Persistent bilateral airspace opacities which may represent multifocal pneumonia or sequela of prior infectious process. 2. Apparent new cavitary lesion in the right mid lung zone. Follow-up with a contrast-enhanced CT of the chest is recommended. 3. Significant cardiomegaly with increased in size since prior study. An underlying pericardial effusion should be considered. 4. Well-positioned tunneled dialysis catheter. 5. Small bilateral pleural  effusions. 6. Electronically Signed   By: Constance Holster M.D.   On: 12/29/2019 21:34   ECHOCARDIOGRAM COMPLETE  Result Date: 12/30/2019    ECHOCARDIOGRAM REPORT   Patient Name:   DJUAN TALTON Date of Exam: 12/30/2019 Medical Rec #:  [id number]        Height:       66.0 in Accession #:    [id number]       Weight:       116.8 lb Date of Birth:  [birth year]        BSA:          1.591 m Patient Age:    76 years         BP:           133/83 mmHg Patient Gender: M                HR:           73 bpm. Exam Location:  [facility] Procedure: 2D Echo Indications:     Acute Respiratory Insufficiency 518.82/R06.89  History:         Patient has prior history of Echocardiogram examinations, most                  recent 10/25/2018.  Sonographer:     [employer] [place] Referring Phys:  [id number] Bradly Bienenstock Diagnosing Phys: Mertie Moores MD IMPRESSIONS  1. Left ventricular ejection fraction, by estimation, is 60 to 65%. The left ventricle has normal function. The left ventricle has no regional wall motion abnormalities. There is moderate left ventricular  hypertrophy. Left ventricular diastolic parameters were normal.  2. Right ventricular systolic function is moderately reduced. The right ventricular size is mildly enlarged. There is moderately elevated pulmonary artery systolic pressure. The estimated right ventricular systolic pressure is 30.8 mmHg.  3. Right atrial size was mildly dilated.  4. Moderate pleural effusion.  5. The mitral valve is grossly normal. No evidence of mitral valve regurgitation. No evidence of mitral stenosis.  6. The aortic valve is normal in structure. Aortic valve regurgitation is not visualized. FINDINGS  Left Ventricle: Left ventricular ejection fraction, by estimation, is 60 to 65%. The left ventricle has normal function. The left ventricle has no regional wall motion abnormalities. The left ventricular internal cavity size was normal in size. There is  moderate left ventricular hypertrophy. Left ventricular diastolic parameters were normal. Right Ventricle: The right ventricular size is mildly enlarged. Right vetricular wall thickness was not assessed. Right ventricular systolic function is moderately reduced. There is moderately elevated pulmonary artery systolic pressure. The tricuspid regurgitant velocity is 3.12 m/s, and with an assumed right atrial pressure of 10 mmHg, the estimated right ventricular systolic pressure is 65.7 mmHg. Left Atrium: Left atrial size was normal in size. Right Atrium: Right atrial size was mildly dilated. Pericardium: A small pericardial effusion is present. Mitral Valve: The mitral valve is grossly normal. No evidence of mitral valve regurgitation. No evidence of mitral valve stenosis. Tricuspid Valve: The tricuspid valve is normal in structure. Tricuspid valve regurgitation is mild. Aortic Valve: The aortic valve is normal in structure. Aortic valve regurgitation is not visualized. Aortic valve peak gradient measures 8.3 mmHg. Pulmonic Valve: The pulmonic valve was grossly normal. Pulmonic valve  regurgitation is mild. Aorta: The aortic root and ascending aorta are structurally normal, with no evidence of dilitation. IAS/Shunts: The atrial septum is grossly normal. Additional Comments: There is a moderate pleural effusion.  LEFT VENTRICLE PLAX 2D  LVIDd:         4.40 cm  Diastology LVIDs:         3.17 cm  LV e' lateral:   9.79 cm/s LV PW:         1.43 cm  LV E/e' lateral: 6.7 LV IVS:        1.32 cm  LV e' medial:    6.09 cm/s LVOT diam:     2.10 cm  LV E/e' medial:  10.8 LV SV:         64 LV SV Index:   40 LVOT Area:     3.46 cm  RIGHT VENTRICLE RV Basal diam:  3.75 cm RV S prime:     11.90 cm/s TAPSE (M-mode): 1.1 cm LEFT ATRIUM           Index       RIGHT ATRIUM           Index LA diam:      3.50 cm 2.20 cm/m  RA Area:     21.90 cm LA Vol (A2C): 34.4 ml 21.62 ml/m RA Volume:   75.00 ml  47.13 ml/m LA Vol (A4C): 25.2 ml 15.84 ml/m  AORTIC VALVE                PULMONIC VALVE AV Area (Vmax): 2.23 cm    PV Vmax:       0.96 m/s AV Vmax:        144.00 cm/s PV Peak grad:  3.7 mmHg AV Peak Grad:   8.3 mmHg LVOT Vmax:      92.70 cm/s LVOT Vmean:     66.500 cm/s LVOT VTI:       0.186 m  AORTA Ao Root diam: 3.00 cm Ao Asc diam:  3.10 cm MITRAL VALVE               TRICUSPID VALVE MV Area (PHT): 3.77 cm    TV Peak grad:   33.6 mmHg MV Decel Time: 201 msec    TV Vmax:        2.90 m/s MV E velocity: 66.00 cm/s  TR Peak grad:   38.9 mmHg MV A velocity: 51.40 cm/s  TR Vmax:        312.00 cm/s MV E/A ratio:  1.28                            SHUNTS                            Systemic VTI:  0.19 m                            Systemic Diam: 2.10 cm Mertie Moores MD Electronically signed by Mertie Moores MD Signature Date/Time: 12/30/2019/3:52:07 PM    Final    ECHOCARDIOGRAM LIMITED  Result Date: 01/04/2020    ECHOCARDIOGRAM LIMITED REPORT   Patient Name:   Shawn [name] Date of Exam: 01/04/2020 Medical Rec #:  [id number]        Height:       66.0 in Accession #:    [id number]       Weight:       128.3 lb Date of Birth:   January 11, 1986        BSA:          1.656 m Patient Age:  34 years         BP:           144/83 mmHg Patient Gender: M                HR:           89 bpm. Exam Location:  [facility] Procedure: 2D Echo, Cardiac Doppler and Limited Color Doppler Indications:     I31.3 Pericardial effusion  History:         Patient has prior history of Echocardiogram examinations, most                  recent 12/30/2019. CKD; Risk Factors:Hypertension and Diabetes.  Sonographer:     Charmayne Sheer RDCS (AE) Referring Phys:  [id number] CARMEN Veda Canning Diagnosing Phys: Yolonda Kida MD IMPRESSIONS  1. Left ventricular ejection fraction, by estimation, is 65 to 70%. The left ventricle has normal function. The left ventricle has no regional wall motion abnormalities. Left ventricular diastolic parameters are consistent with Grade I diastolic dysfunction (impaired relaxation).  2. Right ventricular systolic function is normal. The right ventricular size is normal. There is normal pulmonary artery systolic pressure.  3. Large pleural effusion in the left lateral region.  4. The mitral valve is normal in structure. No evidence of mitral valve regurgitation.  5. The aortic valve is grossly normal. FINDINGS  Left Ventricle: Left ventricular ejection fraction, by estimation, is 65 to 70%. The left ventricle has normal function. The left ventricle has no regional wall motion abnormalities. The left ventricular internal cavity size was normal in size. There is  no left ventricular hypertrophy. Right Ventricle: The right ventricular size is normal. No increase in right ventricular wall thickness. Right ventricular systolic function is normal. There is normal pulmonary artery systolic pressure. Left Atrium: Left atrial size was normal in size. Right Atrium: Right atrial size was normal in size. Pericardium: Trivial pericardial effusion is present. Mitral Valve: The mitral valve is normal in structure. Tricuspid Valve: The tricuspid valve is normal in  structure. Tricuspid valve regurgitation is trivial. Aortic Valve: The aortic valve is grossly normal. Pulmonic Valve: The pulmonic valve was normal in structure. Pulmonic valve regurgitation is not visualized. Aorta: The aortic root is normal in size and structure. Additional Comments: There is a large pleural effusion in the left lateral region.  LEFT VENTRICLE PLAX 2D LVIDd:         5.44 cm LVIDs:         3.27 cm LV PW:         1.04 cm LV IVS:        0.75 cm  LEFT ATRIUM         Index LA diam:    3.90 cm 2.36 cm/m Yolonda Kida MD Electronically signed by Yolonda Kida MD Signature Date/Time: 01/04/2020/2:27:12 PM    Final       Labs: BNP (last 3 results) Recent Labs    12/29/19 2121  BNP 8,242.3*   Basic Metabolic Panel: Recent Labs  Lab 01/05/20 0926 01/06/20 0003 01/07/20 0435 01/07/20 0435 01/08/20 0538 01/09/20 0415 01/10/20 0521 01/11/20 0710 01/12/20 0805  NA 129*   < > 131*   < > 125* 139 139 138 136  K 4.4   < > 4.0   < > 4.2 3.9 4.6 4.6 5.8*  CL 91*   < > 95*   < > 89* 102 104 101 99  CO2 26   < > 22   < >  22 28 25 27 27   GLUCOSE 133*   < > 93   < > 153* 161* 140* 156* 138*  BUN 38*   < > 34*   < > 38* 20 35* 28* 48*  CREATININE 4.51*   < > 4.02*   < > 4.80* 2.88* 3.90* 3.11* 3.94*  CALCIUM 8.2*   < > 8.2*   < > 8.0* 7.9* 8.3* 8.3* 8.6*  MG  --    < >  --   --  1.5* 2.0 2.1 1.9 1.9  PHOS 5.8*  --  4.1  --  5.4* 3.7  --   --   --    < > = values in this interval not displayed.   Liver Function Tests: Recent Labs  Lab 01/05/20 0926 01/06/20 0003 01/07/20 0435 01/08/20 0538 01/09/20 0415  AST  --  13*  --   --   --   ALT  --  10  --   --   --   ALKPHOS  --  77  --   --   --   BILITOT  --  0.7  --   --   --   PROT  --  6.2*  --   --   --   ALBUMIN 2.8* 2.5* 2.3* 2.2* 2.2*   No results for input(s): LIPASE, AMYLASE in the last 168 hours. No results for input(s): AMMONIA in the last 168 hours. CBC: Recent Labs  Lab 01/06/20 0003 01/06/20 0003  01/07/20 0435 01/07/20 0435 01/08/20 0538 01/09/20 0415 01/10/20 0521 01/11/20 0710 01/12/20 0805  WBC 4.3   < > 4.1   < > 4.9 5.1 6.7 6.6 8.9  NEUTROABS 3.1  --  2.1  --   --   --   --   --   --   HGB 8.3*   < > 9.8*   < > 9.5* 8.8* 8.8* 8.4* 8.9*  HCT 28.5*   < > 29.4*   < > 29.5* 28.7* 29.5* 28.6* 30.1*  MCV 91.3   < > 81.2   < > 82.6 86.2 89.4 90.2 90.4  PLT 137*   < > 151   < > 146* 144* 148* 161 182   < > = values in this interval not displayed.   Cardiac Enzymes: No results for input(s): CKTOTAL, CKMB, CKMBINDEX, TROPONINI in the last 168 hours. BNP: Invalid input(s): POCBNP CBG: Recent Labs  Lab 01/11/20 1600 01/11/20 2035 01/12/20 0032 01/12/20 0417 01/12/20 0744  GLUCAP 157* 238* 188* 134* 123*   D-Dimer No results for input(s): DDIMER in the last 72 hours. Hgb A1c No results for input(s): HGBA1C in the last 72 hours. Lipid Profile No results for input(s): CHOL, HDL, LDLCALC, TRIG, CHOLHDL, LDLDIRECT in the last 72 hours. Thyroid function studies No results for input(s): TSH, T4TOTAL, T3FREE, THYROIDAB in the last 72 hours.  Invalid input(s): FREET3 Anemia work up No results for input(s): VITAMINB12, FOLATE, FERRITIN, TIBC, IRON, RETICCTPCT in the last 72 hours. Urinalysis    Component Value Date/Time   COLORURINE YELLOW (A) 10/24/2018 0518   APPEARANCEUR TURBID (A) 10/24/2018 0518   APPEARANCEUR Clear 10/06/2014 0758   LABSPEC 1.009 10/24/2018 0518   LABSPEC 1.031 10/06/2014 0758   PHURINE 5.0 10/24/2018 0518   GLUCOSEU NEGATIVE 10/24/2018 0518   GLUCOSEU >=500 10/06/2014 0758   HGBUR SMALL (A) 10/24/2018 0518   BILIRUBINUR NEGATIVE 10/24/2018 0518   BILIRUBINUR Negative 10/06/2014 0758   KETONESUR 20 (A) 11/01/2018 1137  PROTEINUR 100 (A) 10/24/2018 0518   UROBILINOGEN 0.2 03/29/2007 1506   NITRITE NEGATIVE 10/24/2018 0518   LEUKOCYTESUR NEGATIVE 10/24/2018 0518   LEUKOCYTESUR Negative 10/06/2014 0758   Sepsis Labs Invalid input(s):  PROCALCITONIN,  WBC,  LACTICIDVEN Microbiology Recent Results (from the past 240 hour(s))  Body fluid culture     Status: None   Collection Time: 01/06/20  3:08 PM   Specimen: Body Fluid  Result Value Ref Range Status   Specimen Description   Final    FLUID Performed at [facility], [address] [place], [place] [address]    Special Requests   Final    NONE Performed at [facility], [address] [place], [place] [address]    Gram Stain   Final    RARE WBC PRESENT, PREDOMINANTLY MONONUCLEAR NO ORGANISMS SEEN    Culture   Final    NO GROWTH 3 DAYS Performed at [facility], [place] [address], [place], [place] [address]    Report Status 01/09/2020 FINAL  Final  Acid Fast Smear (AFB)     Status: None   Collection Time: 01/06/20  3:08 PM   Specimen: Pleural, Left; Pleural Fluid  Result Value Ref Range Status   AFB Specimen Processing Concentration  Final   Acid Fast Smear Negative  Final    Comment: (NOTE) Performed At: [facility] [place], [place] [id number] Rush Farmer MD JM:[id number]    Source (AFB) PLEURAL  Final    Comment: Performed at [facility], [address] [place], [place] [address]  Culture, respiratory (non-expectorated)     Status: None   Collection Time: 01/07/20 11:19 AM   Specimen: Tracheal Aspirate; Respiratory  Result Value Ref Range Status   Specimen Description   Final    TRACHEAL ASPIRATE Performed at [facility], [address], [place], [place] [address]    Special Requests   Final    NONE Performed at [facility], [address] [place], [place] [address]    Gram Stain   Final    RARE WBC PRESENT, PREDOMINANTLY PMN RARE GRAM NEGATIVE RODS    Culture   Final    FEW ENTEROBACTER CLOACAE CONFIRMED CARBAPENEMASE RESISTANT ENTEROBACTERIACAE MULTI-DRUG RESISTANT ORGANISM RESULT CALLED TO, READ BACK BY AND VERIFIED WITHRobbie Louis [id number]  mlm Performed at [facility], [place] [address], [place],  [address]    Report Status 01/11/2020 FINAL  Final   Organism ID, Bacteria ENTEROBACTER CLOACAE  Final      Susceptibility   Enterobacter cloacae - MIC*    CEFAZOLIN >=64 RESISTANT Resistant     CEFEPIME >=32 RESISTANT Resistant     CEFTAZIDIME >=64 RESISTANT Resistant     CIPROFLOXACIN >=4 RESISTANT Resistant     GENTAMICIN >=16 RESISTANT Resistant     IMIPENEM >=16 RESISTANT Resistant     TRIMETH/SULFA >=320 RESISTANT Resistant     PIP/TAZO >=128 RESISTANT Resistant     * FEW ENTEROBACTER CLOACAE  Carbapenem Resistance Panel     Status: Abnormal   Collection Time: 01/07/20 11:19 AM  Result Value Ref Range Status   Carba Resistance IMP Gene NOT DETECTED NOT DETECTED Final   Carba Resistance VIM Gene NOT DETECTED NOT DETECTED Final   Carba Resistance NDM Gene NOT DETECTED NOT DETECTED Final   Carba Resistance KPC Gene DETECTED (A) NOT DETECTED Final    Comment: RESULT CALLED TO, READ BACK BY AND VERIFIED WITH: RN [employer]  R. [phone] FCP    Carba Resistance OXA48 Gene NOT DETECTED NOT DETECTED Final    Comment: (NOTE) Cepheid Carba-R is an FDA-cleared nucleic acid amplification test  (NAAT)for the detection and differentiation of genes encoding the  most prevalent carbapenemases in bacterial isolate samples. Carbapenemase gene identification and implementation of comprehensive  infection control measures are recommended by the CDC to prevent the  spread of the resistant organisms. Performed at [facility], [place] [address], [place], [place] [address]      Total time spend on discharging this patient, including the last patient exam, discussing the hospital stay, instructions for ongoing care as it relates to all pertinent caregivers, as well as preparing the medical discharge records, prescriptions, and/or referrals as applicable, is 45 minutes.    Enzo Bi, MD  [facility] 01/12/2020, 9:04  AM  If 7PM-7AM, please contact night-coverage

## 2020-01-13 NOTE — Progress Notes (Signed)
Patient discharged to home via [facility]. IV's successfully taken out and skin intact. Discharge instructions given with understanding of care. Patient received pericare before transport home. Christene Slates  01/13/2020  12:26 AM

## 2020-02-03 DEATH — deceased

## 2020-02-21 LAB — ACID FAST CULTURE WITH REFLEXED SENSITIVITIES (MYCOBACTERIA): Acid Fast Culture: NEGATIVE

## 2020-04-24 IMAGING — DX DG CHEST 1V PORT
1 series · 1 of 1 positions shown · non-contrast
Comparison: 07/13/2017

CLINICAL DATA: Tachycardia

EXAM:
PORTABLE CHEST 1 VIEW

[chest ap]
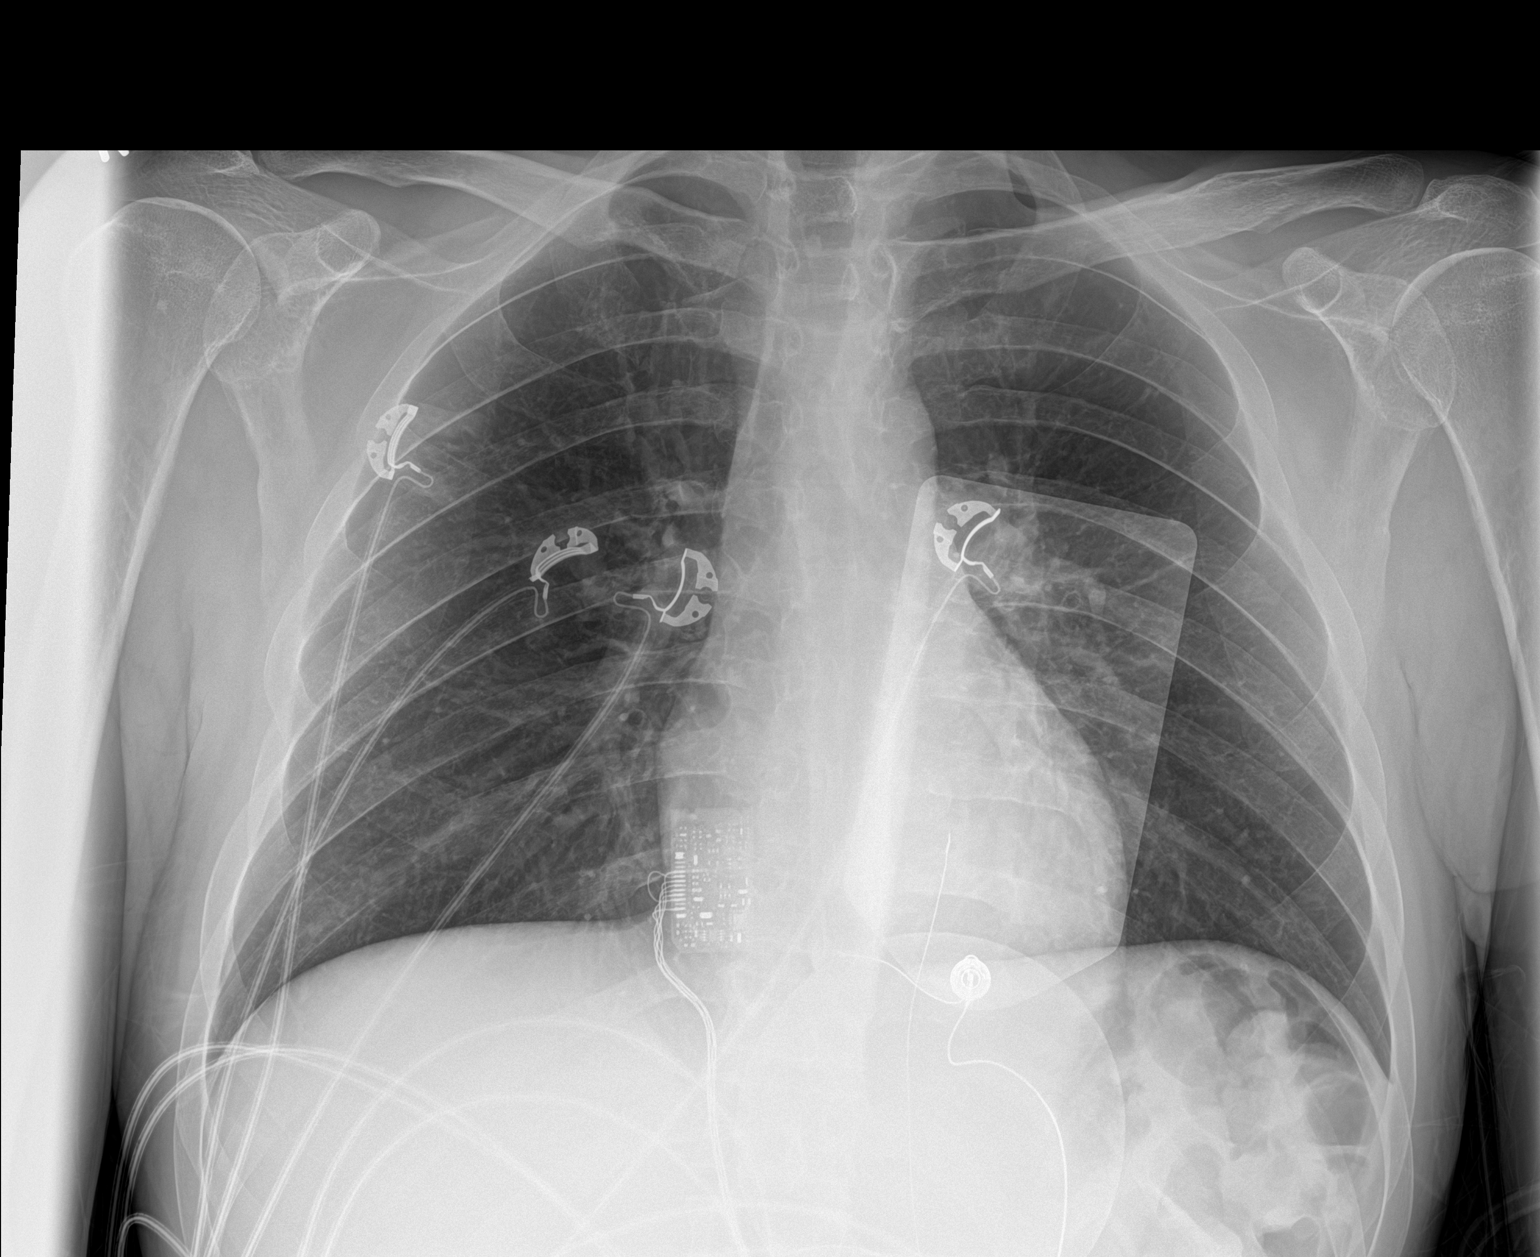

[1 of 1 positions shown; findings below may reference images not displayed]

FINDINGS: The heart size and mediastinal contours are within normal limits.
Both lungs are clear. The visualized skeletal structures are
unremarkable.
IMPRESSION: No active disease.
